# Patient Record
Sex: Female | Born: 1955
Health system: Southern US, Community
[De-identification: ages and names within clinical notes are randomized; demographics above are authoritative.]

## PROBLEM LIST (undated history)

## (undated) DIAGNOSIS — I493 Ventricular premature depolarization: Secondary | ICD-10-CM

## (undated) DIAGNOSIS — M2652 Limited mandibular range of motion: Secondary | ICD-10-CM

## (undated) DIAGNOSIS — Z8719 Personal history of other diseases of the digestive system: Secondary | ICD-10-CM

## (undated) DIAGNOSIS — R002 Palpitations: Secondary | ICD-10-CM

## (undated) DIAGNOSIS — F329 Major depressive disorder, single episode, unspecified: Secondary | ICD-10-CM

## (undated) DIAGNOSIS — L509 Urticaria, unspecified: Secondary | ICD-10-CM

## (undated) DIAGNOSIS — C44221 Squamous cell carcinoma of skin of unspecified ear and external auricular canal: Secondary | ICD-10-CM

## (undated) DIAGNOSIS — Z9989 Dependence on other enabling machines and devices: Secondary | ICD-10-CM

## (undated) DIAGNOSIS — Z9889 Other specified postprocedural states: Secondary | ICD-10-CM

## (undated) DIAGNOSIS — F40240 Claustrophobia: Secondary | ICD-10-CM

## (undated) DIAGNOSIS — Z973 Presence of spectacles and contact lenses: Secondary | ICD-10-CM

## (undated) DIAGNOSIS — F419 Anxiety disorder, unspecified: Secondary | ICD-10-CM

## (undated) DIAGNOSIS — R112 Nausea with vomiting, unspecified: Secondary | ICD-10-CM

## (undated) DIAGNOSIS — F32A Depression, unspecified: Secondary | ICD-10-CM

## (undated) DIAGNOSIS — Z859 Personal history of malignant neoplasm, unspecified: Secondary | ICD-10-CM

## (undated) DIAGNOSIS — L309 Dermatitis, unspecified: Secondary | ICD-10-CM

## (undated) DIAGNOSIS — M1990 Unspecified osteoarthritis, unspecified site: Secondary | ICD-10-CM

## (undated) DIAGNOSIS — G250 Essential tremor: Secondary | ICD-10-CM

## (undated) DIAGNOSIS — K219 Gastro-esophageal reflux disease without esophagitis: Secondary | ICD-10-CM

## (undated) DIAGNOSIS — F3162 Bipolar disorder, current episode mixed, moderate: Secondary | ICD-10-CM

## (undated) DIAGNOSIS — G4733 Obstructive sleep apnea (adult) (pediatric): Secondary | ICD-10-CM

## (undated) DIAGNOSIS — G43909 Migraine, unspecified, not intractable, without status migrainosus: Secondary | ICD-10-CM

## (undated) DIAGNOSIS — D649 Anemia, unspecified: Secondary | ICD-10-CM

## (undated) DIAGNOSIS — J4540 Moderate persistent asthma, uncomplicated: Secondary | ICD-10-CM

## (undated) DIAGNOSIS — Z8659 Personal history of other mental and behavioral disorders: Secondary | ICD-10-CM

## (undated) DIAGNOSIS — R030 Elevated blood-pressure reading, without diagnosis of hypertension: Secondary | ICD-10-CM

## (undated) DIAGNOSIS — J309 Allergic rhinitis, unspecified: Secondary | ICD-10-CM

## (undated) DIAGNOSIS — Z87440 Personal history of urinary (tract) infections: Secondary | ICD-10-CM

## (undated) DIAGNOSIS — R3989 Other symptoms and signs involving the genitourinary system: Secondary | ICD-10-CM

## (undated) DIAGNOSIS — N3010 Interstitial cystitis (chronic) without hematuria: Secondary | ICD-10-CM

## (undated) DIAGNOSIS — M26629 Arthralgia of temporomandibular joint, unspecified side: Secondary | ICD-10-CM

## (undated) DIAGNOSIS — E039 Hypothyroidism, unspecified: Secondary | ICD-10-CM

## (undated) DIAGNOSIS — L719 Rosacea, unspecified: Secondary | ICD-10-CM

## (undated) DIAGNOSIS — R350 Frequency of micturition: Secondary | ICD-10-CM

## (undated) DIAGNOSIS — E119 Type 2 diabetes mellitus without complications: Secondary | ICD-10-CM

## (undated) DIAGNOSIS — Z85820 Personal history of malignant melanoma of skin: Secondary | ICD-10-CM

## (undated) HISTORY — PX: ADENOIDECTOMY: SUR15

## (undated) HISTORY — PX: TEMPOROMANDIBULAR JOINT SURGERY: SHX35

## (undated) HISTORY — PX: OTHER SURGICAL HISTORY: SHX169

## (undated) HISTORY — PX: SINOSCOPY: SHX187

## (undated) HISTORY — PX: CARPAL TUNNEL RELEASE: SHX101

## (undated) HISTORY — PX: TRANSTHORACIC ECHOCARDIOGRAM: SHX275

## (undated) HISTORY — PX: BREAST EXCISIONAL BIOPSY: SUR124

## (undated) HISTORY — DX: Interstitial cystitis (chronic) without hematuria: N30.10

## (undated) HISTORY — DX: Rosacea, unspecified: L71.9

## (undated) HISTORY — DX: Depression, unspecified: F32.A

## (undated) HISTORY — PX: TONSILLECTOMY: SUR1361

## (undated) HISTORY — DX: Urticaria, unspecified: L50.9

## (undated) HISTORY — PX: ULNAR NERVE TRANSPOSITION: SHX2595

## (undated) HISTORY — PX: KNEE ARTHROSCOPY: SUR90

## (undated) HISTORY — DX: Major depressive disorder, single episode, unspecified: F32.9

## (undated) HISTORY — PX: FINGER ARTHROPLASTY: SHX5017

## (undated) HISTORY — PX: TRIGGER FINGER RELEASE: SHX641

## (undated) HISTORY — PX: ANAL FISSURE REPAIR: SHX2312

## (undated) HISTORY — PX: BUNIONECTOMY: SHX129

## (undated) HISTORY — PX: ENDOMETRIAL ABLATION W/ NOVASURE: SUR434

## (undated) HISTORY — DX: Personal history of urinary (tract) infections: Z87.440

## (undated) HISTORY — PX: CHOLECYSTECTOMY OPEN: SUR202

## (undated) HISTORY — PX: NASAL SEPTUM SURGERY: SHX37

## (undated) HISTORY — DX: Dermatitis, unspecified: L30.9

---

## 1982-02-08 DIAGNOSIS — N3010 Interstitial cystitis (chronic) without hematuria: Secondary | ICD-10-CM | POA: Insufficient documentation

## 1997-05-09 ENCOUNTER — Encounter: Admission: RE | Admit: 1997-05-09 | Discharge: 1997-08-07 | Payer: Self-pay | Admitting: Internal Medicine

## 1997-08-19 ENCOUNTER — Other Ambulatory Visit: Admission: RE | Admit: 1997-08-19 | Discharge: 1997-08-19 | Payer: Self-pay | Admitting: Obstetrics and Gynecology

## 1997-10-09 ENCOUNTER — Encounter: Admission: RE | Admit: 1997-10-09 | Discharge: 1998-01-07 | Payer: Self-pay | Admitting: Orthopaedic Surgery

## 1998-04-29 ENCOUNTER — Other Ambulatory Visit: Admission: RE | Admit: 1998-04-29 | Discharge: 1998-04-29 | Payer: Self-pay | Admitting: Internal Medicine

## 1998-08-20 ENCOUNTER — Other Ambulatory Visit: Admission: RE | Admit: 1998-08-20 | Discharge: 1998-08-20 | Payer: Self-pay | Admitting: Obstetrics and Gynecology

## 1999-01-10 ENCOUNTER — Ambulatory Visit (HOSPITAL_COMMUNITY): Admission: RE | Admit: 1999-01-10 | Discharge: 1999-01-10 | Payer: Self-pay | Admitting: Internal Medicine

## 1999-01-10 ENCOUNTER — Encounter: Payer: Self-pay | Admitting: Internal Medicine

## 1999-07-22 ENCOUNTER — Emergency Department (HOSPITAL_COMMUNITY): Admission: EM | Admit: 1999-07-22 | Discharge: 1999-07-22 | Payer: Self-pay | Admitting: Emergency Medicine

## 1999-07-22 ENCOUNTER — Encounter: Payer: Self-pay | Admitting: Emergency Medicine

## 2001-06-19 ENCOUNTER — Encounter: Payer: Self-pay | Admitting: Emergency Medicine

## 2001-06-19 ENCOUNTER — Emergency Department (HOSPITAL_COMMUNITY): Admission: EM | Admit: 2001-06-19 | Discharge: 2001-06-19 | Payer: Self-pay | Admitting: Emergency Medicine

## 2002-02-08 DIAGNOSIS — Z859 Personal history of malignant neoplasm, unspecified: Secondary | ICD-10-CM

## 2002-02-08 HISTORY — DX: Other specified postprocedural states: Z85.9

## 2002-04-11 ENCOUNTER — Other Ambulatory Visit: Admission: RE | Admit: 2002-04-11 | Discharge: 2002-04-11 | Payer: Self-pay | Admitting: Obstetrics and Gynecology

## 2003-02-27 ENCOUNTER — Encounter: Admission: RE | Admit: 2003-02-27 | Discharge: 2003-02-27 | Payer: Self-pay | Admitting: Obstetrics and Gynecology

## 2003-04-16 ENCOUNTER — Ambulatory Visit (HOSPITAL_BASED_OUTPATIENT_CLINIC_OR_DEPARTMENT_OTHER): Admission: RE | Admit: 2003-04-16 | Discharge: 2003-04-16 | Payer: Self-pay | Admitting: General Surgery

## 2003-04-16 ENCOUNTER — Encounter (INDEPENDENT_AMBULATORY_CARE_PROVIDER_SITE_OTHER): Payer: Self-pay | Admitting: *Deleted

## 2003-04-16 ENCOUNTER — Ambulatory Visit (HOSPITAL_COMMUNITY): Admission: RE | Admit: 2003-04-16 | Discharge: 2003-04-16 | Payer: Self-pay | Admitting: General Surgery

## 2003-07-29 ENCOUNTER — Other Ambulatory Visit: Admission: RE | Admit: 2003-07-29 | Discharge: 2003-07-29 | Payer: Self-pay | Admitting: Internal Medicine

## 2004-02-07 ENCOUNTER — Encounter: Admission: RE | Admit: 2004-02-07 | Discharge: 2004-02-07 | Payer: Self-pay | Admitting: Otolaryngology

## 2004-02-26 ENCOUNTER — Ambulatory Visit: Payer: Self-pay | Admitting: Internal Medicine

## 2004-03-13 ENCOUNTER — Ambulatory Visit: Payer: Self-pay | Admitting: Internal Medicine

## 2004-03-27 ENCOUNTER — Ambulatory Visit: Payer: Self-pay | Admitting: Internal Medicine

## 2004-04-01 ENCOUNTER — Ambulatory Visit: Payer: Self-pay | Admitting: Internal Medicine

## 2004-04-24 ENCOUNTER — Ambulatory Visit: Payer: Self-pay | Admitting: Internal Medicine

## 2004-05-07 ENCOUNTER — Ambulatory Visit: Payer: Self-pay | Admitting: Internal Medicine

## 2004-05-12 ENCOUNTER — Ambulatory Visit (HOSPITAL_BASED_OUTPATIENT_CLINIC_OR_DEPARTMENT_OTHER): Admission: RE | Admit: 2004-05-12 | Discharge: 2004-05-12 | Payer: Self-pay | Admitting: Internal Medicine

## 2004-05-17 ENCOUNTER — Ambulatory Visit: Payer: Self-pay | Admitting: Internal Medicine

## 2004-06-04 ENCOUNTER — Ambulatory Visit: Payer: Self-pay | Admitting: Internal Medicine

## 2004-11-02 ENCOUNTER — Ambulatory Visit: Payer: Self-pay | Admitting: Family Medicine

## 2004-11-05 ENCOUNTER — Encounter: Admission: RE | Admit: 2004-11-05 | Discharge: 2004-11-05 | Payer: Self-pay | Admitting: Family Medicine

## 2004-12-04 ENCOUNTER — Ambulatory Visit: Payer: Self-pay | Admitting: Internal Medicine

## 2004-12-07 ENCOUNTER — Ambulatory Visit: Payer: Self-pay | Admitting: Internal Medicine

## 2004-12-17 ENCOUNTER — Ambulatory Visit: Payer: Self-pay | Admitting: Internal Medicine

## 2004-12-17 ENCOUNTER — Encounter (INDEPENDENT_AMBULATORY_CARE_PROVIDER_SITE_OTHER): Payer: Self-pay | Admitting: Specialist

## 2005-01-06 ENCOUNTER — Ambulatory Visit: Payer: Self-pay | Admitting: Internal Medicine

## 2005-05-21 ENCOUNTER — Ambulatory Visit: Payer: Self-pay | Admitting: Family Medicine

## 2005-06-01 ENCOUNTER — Ambulatory Visit: Payer: Self-pay | Admitting: Internal Medicine

## 2005-07-28 ENCOUNTER — Ambulatory Visit (HOSPITAL_COMMUNITY): Admission: RE | Admit: 2005-07-28 | Discharge: 2005-07-28 | Payer: Self-pay | Admitting: Gastroenterology

## 2005-07-29 ENCOUNTER — Ambulatory Visit: Payer: Self-pay | Admitting: Obstetrics & Gynecology

## 2005-07-29 ENCOUNTER — Encounter (INDEPENDENT_AMBULATORY_CARE_PROVIDER_SITE_OTHER): Payer: Self-pay | Admitting: Specialist

## 2005-08-19 ENCOUNTER — Ambulatory Visit (HOSPITAL_COMMUNITY): Admission: RE | Admit: 2005-08-19 | Discharge: 2005-08-19 | Payer: Self-pay | Admitting: Obstetrics and Gynecology

## 2005-09-07 ENCOUNTER — Encounter (INDEPENDENT_AMBULATORY_CARE_PROVIDER_SITE_OTHER): Payer: Self-pay | Admitting: Specialist

## 2005-09-07 ENCOUNTER — Ambulatory Visit (HOSPITAL_COMMUNITY): Admission: RE | Admit: 2005-09-07 | Discharge: 2005-09-07 | Payer: Self-pay | Admitting: Gastroenterology

## 2006-01-13 ENCOUNTER — Ambulatory Visit (HOSPITAL_COMMUNITY): Admission: RE | Admit: 2006-01-13 | Discharge: 2006-01-13 | Payer: Self-pay | Admitting: Gastroenterology

## 2006-01-20 ENCOUNTER — Ambulatory Visit: Payer: Self-pay | Admitting: Internal Medicine

## 2006-03-02 ENCOUNTER — Ambulatory Visit: Payer: Self-pay | Admitting: Internal Medicine

## 2006-03-02 ENCOUNTER — Ambulatory Visit: Payer: Self-pay | Admitting: Obstetrics & Gynecology

## 2006-03-03 ENCOUNTER — Ambulatory Visit (HOSPITAL_COMMUNITY): Admission: RE | Admit: 2006-03-03 | Discharge: 2006-03-03 | Payer: Self-pay | Admitting: Obstetrics & Gynecology

## 2006-03-25 ENCOUNTER — Ambulatory Visit: Payer: Self-pay | Admitting: Internal Medicine

## 2006-03-27 ENCOUNTER — Encounter: Payer: Self-pay | Admitting: Internal Medicine

## 2006-04-14 ENCOUNTER — Ambulatory Visit: Payer: Self-pay | Admitting: Obstetrics and Gynecology

## 2006-05-02 ENCOUNTER — Ambulatory Visit (HOSPITAL_COMMUNITY): Admission: RE | Admit: 2006-05-02 | Discharge: 2006-05-02 | Payer: Self-pay | Admitting: Gastroenterology

## 2006-05-25 ENCOUNTER — Encounter: Admission: RE | Admit: 2006-05-25 | Discharge: 2006-05-25 | Payer: Self-pay | Admitting: Family Medicine

## 2006-05-27 ENCOUNTER — Ambulatory Visit: Payer: Self-pay | Admitting: Internal Medicine

## 2006-08-04 ENCOUNTER — Ambulatory Visit: Payer: Self-pay | Admitting: Internal Medicine

## 2006-08-04 LAB — CONVERTED CEMR LAB
Basophils Absolute: 0 10*3/uL (ref 0.0–0.1)
Eosinophils Absolute: 0.1 10*3/uL (ref 0.0–0.6)
MCHC: 33.9 g/dL (ref 30.0–36.0)
MCV: 94.6 fL (ref 78.0–100.0)
Neutrophils Relative %: 77.9 % — ABNORMAL HIGH (ref 43.0–77.0)
Platelets: 243 10*3/uL (ref 150–400)
RBC: 4.28 M/uL (ref 3.87–5.11)

## 2006-09-05 ENCOUNTER — Telehealth: Payer: Self-pay | Admitting: Internal Medicine

## 2006-09-14 ENCOUNTER — Telehealth (INDEPENDENT_AMBULATORY_CARE_PROVIDER_SITE_OTHER): Payer: Self-pay | Admitting: *Deleted

## 2006-10-07 ENCOUNTER — Ambulatory Visit: Payer: Self-pay | Admitting: Internal Medicine

## 2006-10-07 DIAGNOSIS — N3010 Interstitial cystitis (chronic) without hematuria: Secondary | ICD-10-CM | POA: Insufficient documentation

## 2006-10-07 DIAGNOSIS — G479 Sleep disorder, unspecified: Secondary | ICD-10-CM | POA: Insufficient documentation

## 2006-10-07 DIAGNOSIS — Z85828 Personal history of other malignant neoplasm of skin: Secondary | ICD-10-CM | POA: Insufficient documentation

## 2006-10-07 DIAGNOSIS — L719 Rosacea, unspecified: Secondary | ICD-10-CM | POA: Insufficient documentation

## 2006-10-07 DIAGNOSIS — J029 Acute pharyngitis, unspecified: Secondary | ICD-10-CM | POA: Insufficient documentation

## 2006-10-07 DIAGNOSIS — F319 Bipolar disorder, unspecified: Secondary | ICD-10-CM | POA: Insufficient documentation

## 2006-10-07 DIAGNOSIS — K589 Irritable bowel syndrome without diarrhea: Secondary | ICD-10-CM | POA: Insufficient documentation

## 2006-10-07 DIAGNOSIS — I10 Essential (primary) hypertension: Secondary | ICD-10-CM | POA: Insufficient documentation

## 2006-10-07 DIAGNOSIS — J4540 Moderate persistent asthma, uncomplicated: Secondary | ICD-10-CM | POA: Insufficient documentation

## 2006-10-07 DIAGNOSIS — J310 Chronic rhinitis: Secondary | ICD-10-CM | POA: Insufficient documentation

## 2006-10-07 DIAGNOSIS — J302 Other seasonal allergic rhinitis: Secondary | ICD-10-CM | POA: Insufficient documentation

## 2006-10-07 DIAGNOSIS — E039 Hypothyroidism, unspecified: Secondary | ICD-10-CM | POA: Insufficient documentation

## 2006-10-07 DIAGNOSIS — M170 Bilateral primary osteoarthritis of knee: Secondary | ICD-10-CM | POA: Insufficient documentation

## 2006-10-07 DIAGNOSIS — J3089 Other allergic rhinitis: Secondary | ICD-10-CM

## 2006-10-07 LAB — CONVERTED CEMR LAB: Rapid Strep: NEGATIVE

## 2006-10-11 ENCOUNTER — Telehealth: Payer: Self-pay | Admitting: Internal Medicine

## 2006-11-02 ENCOUNTER — Ambulatory Visit: Payer: Self-pay | Admitting: Internal Medicine

## 2006-11-02 DIAGNOSIS — L259 Unspecified contact dermatitis, unspecified cause: Secondary | ICD-10-CM | POA: Insufficient documentation

## 2006-11-02 DIAGNOSIS — R05 Cough: Secondary | ICD-10-CM | POA: Insufficient documentation

## 2006-11-02 DIAGNOSIS — K219 Gastro-esophageal reflux disease without esophagitis: Secondary | ICD-10-CM | POA: Insufficient documentation

## 2006-11-02 DIAGNOSIS — R053 Chronic cough: Secondary | ICD-10-CM | POA: Insufficient documentation

## 2006-11-23 ENCOUNTER — Telehealth (INDEPENDENT_AMBULATORY_CARE_PROVIDER_SITE_OTHER): Payer: Self-pay | Admitting: *Deleted

## 2006-12-08 ENCOUNTER — Ambulatory Visit: Payer: Self-pay | Admitting: Internal Medicine

## 2007-01-30 ENCOUNTER — Ambulatory Visit: Payer: Self-pay | Admitting: Internal Medicine

## 2007-01-30 DIAGNOSIS — G4733 Obstructive sleep apnea (adult) (pediatric): Secondary | ICD-10-CM

## 2007-01-30 DIAGNOSIS — Z9989 Dependence on other enabling machines and devices: Secondary | ICD-10-CM | POA: Insufficient documentation

## 2007-01-30 DIAGNOSIS — G4730 Sleep apnea, unspecified: Secondary | ICD-10-CM | POA: Insufficient documentation

## 2007-02-08 ENCOUNTER — Ambulatory Visit: Payer: Self-pay | Admitting: Internal Medicine

## 2007-02-20 ENCOUNTER — Telehealth: Payer: Self-pay | Admitting: Internal Medicine

## 2007-02-21 ENCOUNTER — Telehealth (INDEPENDENT_AMBULATORY_CARE_PROVIDER_SITE_OTHER): Payer: Self-pay | Admitting: *Deleted

## 2007-03-17 ENCOUNTER — Encounter: Payer: Self-pay | Admitting: Internal Medicine

## 2007-03-20 ENCOUNTER — Telehealth: Payer: Self-pay | Admitting: Internal Medicine

## 2007-03-24 ENCOUNTER — Telehealth: Payer: Self-pay | Admitting: *Deleted

## 2007-03-24 DIAGNOSIS — N941 Unspecified dyspareunia: Secondary | ICD-10-CM | POA: Insufficient documentation

## 2007-06-02 ENCOUNTER — Telehealth: Payer: Self-pay | Admitting: *Deleted

## 2007-07-05 ENCOUNTER — Other Ambulatory Visit: Admission: RE | Admit: 2007-07-05 | Discharge: 2007-07-05 | Payer: Self-pay | Admitting: Obstetrics & Gynecology

## 2007-07-05 ENCOUNTER — Ambulatory Visit: Payer: Self-pay | Admitting: Obstetrics & Gynecology

## 2007-07-19 ENCOUNTER — Ambulatory Visit: Payer: Self-pay | Admitting: Obstetrics and Gynecology

## 2007-07-19 ENCOUNTER — Ambulatory Visit (HOSPITAL_COMMUNITY): Admission: RE | Admit: 2007-07-19 | Discharge: 2007-07-19 | Payer: Self-pay | Admitting: Surgery

## 2007-07-21 ENCOUNTER — Telehealth: Payer: Self-pay | Admitting: *Deleted

## 2007-07-25 ENCOUNTER — Ambulatory Visit: Payer: Self-pay | Admitting: Internal Medicine

## 2007-07-31 LAB — CONVERTED CEMR LAB: Lithium Lvl: 0.13 meq/L — ABNORMAL LOW (ref 0.80–1.40)

## 2007-08-07 ENCOUNTER — Ambulatory Visit (HOSPITAL_COMMUNITY): Admission: RE | Admit: 2007-08-07 | Discharge: 2007-08-07 | Payer: Self-pay | Admitting: Obstetrics & Gynecology

## 2007-08-07 ENCOUNTER — Encounter: Payer: Self-pay | Admitting: Obstetrics & Gynecology

## 2007-08-07 ENCOUNTER — Ambulatory Visit: Payer: Self-pay | Admitting: Obstetrics & Gynecology

## 2007-08-22 ENCOUNTER — Ambulatory Visit: Payer: Self-pay | Admitting: Internal Medicine

## 2007-08-30 LAB — CONVERTED CEMR LAB
ALT: 11 units/L (ref 0–35)
AST: 15 units/L (ref 0–37)
Albumin: 3.1 g/dL — ABNORMAL LOW (ref 3.5–5.2)
Alkaline Phosphatase: 57 units/L (ref 39–117)
BUN: 23 mg/dL (ref 6–23)
Bilirubin, Direct: 0.1 mg/dL (ref 0.0–0.3)
CO2: 28 meq/L (ref 19–32)
Chloride: 106 meq/L (ref 96–112)
Eosinophils Relative: 2.5 % (ref 0.0–5.0)
GFR calc Af Amer: 75 mL/min
Glucose, Bld: 73 mg/dL (ref 70–99)
HCT: 36.5 % (ref 36.0–46.0)
HDL: 47.6 mg/dL (ref >39.0)
LDL Cholesterol: 83 mg/dL (ref 0–99)
Lymphocytes Relative: 25.4 % (ref 12.0–46.0)
Monocytes Absolute: 0.6 10*3/uL (ref 0.1–1.0)
Monocytes Relative: 5.1 % (ref 3.0–12.0)
Platelets: 285 10*3/uL (ref 150–400)
Potassium: 5 meq/L (ref 3.5–5.1)
Total CHOL/HDL Ratio: 3.6
Total Protein: 6.2 g/dL (ref 6.0–8.3)
WBC: 11 10*3/uL — ABNORMAL HIGH (ref 4.5–10.5)

## 2007-09-01 ENCOUNTER — Ambulatory Visit: Payer: Self-pay | Admitting: Obstetrics & Gynecology

## 2007-09-04 ENCOUNTER — Telehealth: Payer: Self-pay | Admitting: *Deleted

## 2007-10-30 ENCOUNTER — Encounter: Payer: Self-pay | Admitting: Internal Medicine

## 2007-11-09 ENCOUNTER — Telehealth: Payer: Self-pay | Admitting: Internal Medicine

## 2007-11-22 ENCOUNTER — Telehealth: Payer: Self-pay | Admitting: Family Medicine

## 2007-12-01 ENCOUNTER — Ambulatory Visit: Payer: Self-pay | Admitting: Internal Medicine

## 2007-12-01 DIAGNOSIS — N898 Other specified noninflammatory disorders of vagina: Secondary | ICD-10-CM | POA: Insufficient documentation

## 2007-12-01 DIAGNOSIS — Z9189 Other specified personal risk factors, not elsewhere classified: Secondary | ICD-10-CM | POA: Insufficient documentation

## 2007-12-01 DIAGNOSIS — N938 Other specified abnormal uterine and vaginal bleeding: Secondary | ICD-10-CM | POA: Insufficient documentation

## 2007-12-01 DIAGNOSIS — N949 Unspecified condition associated with female genital organs and menstrual cycle: Secondary | ICD-10-CM | POA: Insufficient documentation

## 2007-12-01 DIAGNOSIS — N925 Other specified irregular menstruation: Secondary | ICD-10-CM | POA: Insufficient documentation

## 2007-12-26 ENCOUNTER — Telehealth: Payer: Self-pay | Admitting: Internal Medicine

## 2008-01-22 ENCOUNTER — Telehealth: Payer: Self-pay | Admitting: Internal Medicine

## 2008-02-20 ENCOUNTER — Telehealth: Payer: Self-pay | Admitting: *Deleted

## 2008-04-01 ENCOUNTER — Encounter: Payer: Self-pay | Admitting: Internal Medicine

## 2008-04-23 ENCOUNTER — Telehealth: Payer: Self-pay | Admitting: Internal Medicine

## 2008-05-02 ENCOUNTER — Telehealth: Payer: Self-pay | Admitting: *Deleted

## 2008-05-09 ENCOUNTER — Telehealth: Payer: Self-pay | Admitting: Internal Medicine

## 2008-05-17 ENCOUNTER — Telehealth: Payer: Self-pay | Admitting: *Deleted

## 2008-08-15 ENCOUNTER — Ambulatory Visit: Payer: Self-pay | Admitting: Obstetrics & Gynecology

## 2008-08-15 ENCOUNTER — Other Ambulatory Visit: Admission: RE | Admit: 2008-08-15 | Discharge: 2008-08-15 | Payer: Self-pay | Admitting: Obstetrics & Gynecology

## 2008-08-15 ENCOUNTER — Encounter: Payer: Self-pay | Admitting: Obstetrics & Gynecology

## 2008-08-15 ENCOUNTER — Encounter: Payer: Self-pay | Admitting: Obstetrics and Gynecology

## 2008-08-15 LAB — CONVERTED CEMR LAB: Trich, Wet Prep: NONE SEEN

## 2008-08-16 ENCOUNTER — Encounter: Payer: Self-pay | Admitting: Obstetrics & Gynecology

## 2008-08-16 LAB — CONVERTED CEMR LAB
Hemoglobin: 13.1 g/dL (ref 12.0–15.0)
Prolactin: 44.2 ng/mL
RBC: 4.24 M/uL (ref 3.87–5.11)
TSH: 1.398 microintl units/mL (ref 0.350–4.500)
WBC: 10 10*3/uL (ref 4.0–10.5)

## 2008-08-20 ENCOUNTER — Ambulatory Visit (HOSPITAL_COMMUNITY): Admission: RE | Admit: 2008-08-20 | Discharge: 2008-08-20 | Payer: Self-pay | Admitting: Family Medicine

## 2008-08-29 ENCOUNTER — Ambulatory Visit: Payer: Self-pay | Admitting: Obstetrics & Gynecology

## 2008-08-30 ENCOUNTER — Encounter: Payer: Self-pay | Admitting: Internal Medicine

## 2008-08-30 LAB — CONVERTED CEMR LAB: FSH: 12.1 milliintl units/mL

## 2008-09-04 ENCOUNTER — Ambulatory Visit (HOSPITAL_COMMUNITY): Admission: RE | Admit: 2008-09-04 | Discharge: 2008-09-04 | Payer: Self-pay | Admitting: Internal Medicine

## 2008-09-12 ENCOUNTER — Ambulatory Visit: Payer: Self-pay | Admitting: Obstetrics and Gynecology

## 2008-10-21 ENCOUNTER — Ambulatory Visit: Payer: Self-pay | Admitting: Obstetrics & Gynecology

## 2008-10-21 ENCOUNTER — Ambulatory Visit (HOSPITAL_COMMUNITY): Admission: RE | Admit: 2008-10-21 | Discharge: 2008-10-21 | Payer: Self-pay | Admitting: Obstetrics & Gynecology

## 2008-11-15 ENCOUNTER — Inpatient Hospital Stay (HOSPITAL_COMMUNITY): Admission: AD | Admit: 2008-11-15 | Discharge: 2008-11-15 | Payer: Self-pay | Admitting: Obstetrics & Gynecology

## 2008-11-15 ENCOUNTER — Ambulatory Visit: Payer: Self-pay | Admitting: Obstetrics & Gynecology

## 2009-10-14 ENCOUNTER — Telehealth: Payer: Self-pay | Admitting: Internal Medicine

## 2010-01-12 ENCOUNTER — Emergency Department (HOSPITAL_COMMUNITY)
Admission: EM | Admit: 2010-01-12 | Discharge: 2010-01-12 | Payer: Self-pay | Source: Home / Self Care | Admitting: Emergency Medicine

## 2010-01-19 ENCOUNTER — Telehealth: Payer: Self-pay | Admitting: *Deleted

## 2010-02-18 ENCOUNTER — Encounter: Payer: Self-pay | Admitting: Internal Medicine

## 2010-03-04 ENCOUNTER — Telehealth: Payer: Self-pay | Admitting: *Deleted
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
## 2010-03-12 NOTE — Progress Notes (Signed)
  Phone Note Call from Patient Call back at Home Phone [phone] Call back at [phone]   Caller: Patient Summary of Call: Pt has got her [employer]. She got her labs done recently unsure who done them. Pt is wanting to know if we could continue to see Dr. Fabian Sharp or if she needs to go to [facility]. Initial call taken by: Romualdo Bolk, CMA Duncan Dull),  March 04, 2010 1:38 PM  Follow-up for Phone Call          [facility]  not [facility] .    they can help with meds and consults  and other care  etc.    However it is up to her.  Follow-up by: Madelin Headings MD,  March 05, 2010 10:14 PM  Additional Follow-up for Phone Call Additional follow up Details #1::        Left message on machine about above. Additional Follow-up by: Romualdo Bolk, CMA (AAMA),  March 06, 2010 9:40 AM

## 2010-03-12 NOTE — Progress Notes (Signed)
Summary: refill  Phone Note From Pharmacy   Caller: [facility] Reason for Call: Needs renewal Details for Reason: synthroid Initial call taken by: Romualdo Bolk, CMA (AAMA),  January 19, 2010 4:42 PM    Prescriptions: LEVOTHROID 75 MCG TABS (LEVOTHYROXINE SODIUM) Take 1 tablet by mouth once a day  #90 x 0   Entered by:   Romualdo Bolk, CMA (AAMA)   Authorized by:   Madelin Headings MD   Signed by:   Romualdo Bolk, CMA (AAMA) on 01/19/2010   Method used:   Faxed to ...       [facility] Department (retail)       [address] [place], [place]  [address]       Ph: [phone]       Fax: [phone]   RxID:   [id number]

## 2010-05-01 ENCOUNTER — Inpatient Hospital Stay (INDEPENDENT_AMBULATORY_CARE_PROVIDER_SITE_OTHER)
Admission: RE | Admit: 2010-05-01 | Discharge: 2010-05-01 | Disposition: A | Discharge status: Home / Self Care | Payer: Self-pay | Source: Ambulatory Visit | Attending: Family Medicine | Admitting: Family Medicine

## 2010-05-01 DIAGNOSIS — J019 Acute sinusitis, unspecified: Secondary | ICD-10-CM

## 2010-05-15 LAB — URINALYSIS, ROUTINE W REFLEX MICROSCOPIC
Glucose, UA: NEGATIVE mg/dL
Protein, ur: NEGATIVE mg/dL
Urobilinogen, UA: 0.2 mg/dL (ref 0.0–1.0)

## 2010-05-15 LAB — CBC
Hemoglobin: 14.1 g/dL (ref 12.0–15.0)
MCHC: 33.9 g/dL (ref 30.0–36.0)
RBC: 4.33 MIL/uL (ref 3.87–5.11)
RDW: 12.8 % (ref 11.5–15.5)

## 2010-05-15 LAB — PREGNANCY, URINE: Preg Test, Ur: NEGATIVE

## 2010-05-15 LAB — BASIC METABOLIC PANEL
CO2: 28 mEq/L (ref 19–32)
Calcium: 9.7 mg/dL (ref 8.4–10.5)
GFR calc Af Amer: >60 mL/min (ref >60)
GFR calc non Af Amer: >60 mL/min (ref >60)
Glucose, Bld: 78 mg/dL (ref 70–99)
Potassium: 4 mEq/L (ref 3.5–5.1)
Sodium: 139 mEq/L (ref 135–145)

## 2010-06-15 ENCOUNTER — Other Ambulatory Visit (HOSPITAL_COMMUNITY): Payer: Self-pay | Admitting: Family Medicine

## 2010-06-15 DIAGNOSIS — Z1231 Encounter for screening mammogram for malignant neoplasm of breast: Secondary | ICD-10-CM

## 2010-06-19 ENCOUNTER — Ambulatory Visit (HOSPITAL_COMMUNITY): Admission: RE | Admit: 2010-06-19 | Payer: Self-pay | Source: Ambulatory Visit

## 2010-06-19 ENCOUNTER — Inpatient Hospital Stay (INDEPENDENT_AMBULATORY_CARE_PROVIDER_SITE_OTHER)
Admission: RE | Admit: 2010-06-19 | Discharge: 2010-06-19 | Disposition: A | Discharge status: Home / Self Care | Payer: Self-pay | Source: Ambulatory Visit | Attending: Emergency Medicine | Admitting: Emergency Medicine

## 2010-06-19 ENCOUNTER — Ambulatory Visit (INDEPENDENT_AMBULATORY_CARE_PROVIDER_SITE_OTHER): Payer: Self-pay

## 2010-06-19 DIAGNOSIS — S139XXA Sprain of joints and ligaments of unspecified parts of neck, initial encounter: Secondary | ICD-10-CM

## 2010-06-19 DIAGNOSIS — G44209 Tension-type headache, unspecified, not intractable: Secondary | ICD-10-CM

## 2010-06-23 NOTE — Group Therapy Note (Signed)
[name], [name]      ACCOUNT NO.:  [id number]   MEDICAL RECORD NO.:  [id number]          PATIENT TYPE:  WOC   LOCATION:  [facility]                   FACILITY:  [facility]   PHYSICIAN:  Dorthula Perfect, MD     DATE OF BIRTH:  1955/07/22   DATE OF SERVICE:  07/05/2007                                  CLINIC NOTE   A 55 year old white female, nulliparous who presents with a complaint of  irregular bleeding and abdominal pain.  She was seen here in January  2008, by myself with abdominal pain and past history of interstitial  cystitis for which she was evaluated and treated by Dr. Larey Dresser.  She has no urinary complaints at this time.  She has been evaluated for  the abdominal discomfort by Dr. Ewing Schlein, and had an abdominal and pelvic  CT scan done in the past.  A pelvic ultrasound in January 2008, revealed  normal ovaries.  The uterus was 8.1 cm in length, 2.8 in width and 4.1  transverse.  The endometrial stripe was thin.  Because she was getting  married a year ago, she went to family planning where they tried  different combinations of birth control pills to provide contraception,  as well as help with her bleeding.  She has had irregular menses for  years, described as going no longer than 2 weeks without any bleeding.  She was put on a birth control pill first for 3 months, and despite  being on that, she had bleeding almost every day.  She was then put on  Seasonale which she took for 5 months.  She had bleeding and/or spotting  every day.  She has been put on Ortho-Cyclen for the past six cycles and  uses a mini-pad every day.  She has occasional mild cramping.  She has  occasional bleeding with sexual intercourse.  Her last Pap smear was 2  months ago, and she does not have the result of that.   She has occasional sharp right lower quadrant pains.  She has no problem  with constipation and/or diarrhea.   Her mother was menopausal at age 45.   PHYSICAL EXAMINATION:   VITAL SIGNS:  Height 5 feet, weight 142, blood  pressure 140/83.  Exam was limited to the abdomen and pelvis.  ABDOMEN:  Her abdomen is soft and nontender.  No masses are felt.  PELVIC:  External genitalia and BUS glands were normal.  Vaginal vault  was epithelialized as was the cervix.  The uterus was of normal size and  shape.  It is was nontender.  The left adnexal areas were bilaterally  normal.   IMPRESSION:  Abnormal uterine bleeding.   DISPOSITION:  Because she has been on various forms of contraception for  the past year without change in her bleeding in pattern, I elected to do  an endometrial biopsy.  The uterus sounded 7-cm in the mid plane.  Endometrial biopsy was performed, with some cramping on the part of the  patient.  On withdrawal of the suction curette, a mucusy polyp came  forth which was grasped with packing forceps and was removed.  This  caused a slight amount  of endocervical bleeding.   1. The patient will return in 2 weeks for results and follow up.  2. Whether or not removal of this polyp will take of her problem only      time will tell.  She may need a hysteroscopy to further delineate      the etiology of the bleeding.           ______________________________  Dorthula Perfect, MD     ER/MEDQ  D:  07/05/2007  T:  07/05/2007  Job:  [id number]

## 2010-06-23 NOTE — Group Therapy Note (Signed)
NAME:  [name], [name]      ACCOUNT NO.:  [id number]   MEDICAL RECORD NO.:  [id number]          PATIENT TYPE:  WOC   [facility]ATION[facility]                   FACILITY:  [facility]   PHYSICIAN:  Deirdre Christy Gentles, CNM       DATE OF BIRTH:  June 04, 1955   DATE OF SERVICE:                                  CLINIC NOTE   CHIEF COMPLAINT:  Abnormal vaginal bleeding and right lower quadrant  pain.   HISTORY:  This is a 55 year old Caucasian nulligravida, who has a 2-year  history of both AUB     and chronic right lower quadrant pain.  She  describes having 3 menses per month and also spotting intermittently  whether or not she is on OCPs, she also has postcoital bleeding about 3  out of 4 times.  She would like something done to stop the bleeding.  She has had a complete workup both from her gastroenterologist to get  ultrasounds and colonoscopy and from GYN clinic here, where she had  essentially negative ultrasound on August 20, 2008.  Uterus is 8-cm size,  no fibroids.  Ovaries are essentially normal.  Also last month, she had  a mammogram that was negative.  Her Pap smear was negative on August 15, 2008 as well.  Her endometrial biopsy was benign on August 15, 2008.  The  uterine curettings showed no hyperplasia or carcinoma done in June 2009.  Of note, she did have an endocervical polyp removed.  Following that,  she had a hysteroscopy, which was in June 2009 and it was fairly normal  pelvic on her diagnostic hysteroscopy.  She has FSH, which is 12.1  consistent with being premenopausal.  GC and chlamydia are negative.  Prolactin is minimally elevated at 44.  TSH is normal at 1.3.  Her  hemoglobin is  13.1.  Her wet prep was negative.  Urine culture was  negative.  She did take the Elmiron as ordered by Dr. Debroah Loop at her last  visit here and says that it did nothing for her pain and that she is  quite sure her pain is not from interstitial cystitis.  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  She does still see Mental Health for her bipolar disorder, but  has lost her insurance and is not seeing other specialists.  Of note,  she does use CPAP most nights and she takes Singulair and Rhinocort on a  regular basis for asthma.   PHYSICAL EXAMINATION:  VITAL SIGNS:  Her BP is 126/76, weight 143,  height is 5 feet, temperature 97.1.  GENERAL:  Physical exam is deferred today.  She is in no distress.   ASSESSMENT AND PLAN:  In consultation with Dr. Macon Large, who saw the  patient last month and at that time did a complete history and physical.  It is decided that we will proceed with NovaSure ablation.  The  procedure is discussed at length with the patient and she was given a  handout and a brochure on NovaSure and she is told that she will be  getting a call regarding  scheduling the procedure.           ______________________________  Caren Griffins, CNM     DP/MEDQ  D:  09/12/2008  T:  09/13/2008  Job:  [id number]

## 2010-06-23 NOTE — Group Therapy Note (Signed)
[name], [name]      ACCOUNT NO.:  [id number]   MEDICAL RECORD NO.:  [id number]          PATIENT TYPE:  WOC   [facility]ATION[facility]                   FACILITY:  [facility]   PHYSICIAN:  Jaynie Collins, MD     DATE OF BIRTH:  Sep 11, 1955   DATE OF SERVICE:                                  CLINIC NOTE   CHIEF COMPLAINT:  Irregular bleeding and right lower quadrant pain.   HISTORY OF PRESENT ILLNESS:  The patient is a 55 year old nulligravida  with a history of dysfunctional uterine bleeding and chronic right lower  quadrant pain, who was last seen in June 2009 for a similar complaint of  having irregular bleeding.  The patient did undergo a hysteroscopy D and  C in 2009, which was only remarkable for an endometrial polyp.  She  reports that after her hysteroscopy, she did not have any abnormal  bleeding for 2 months, but then the bleeding reoccurred.  The patient at  this point reports bleeding most days of the month and is having less  than a week total free of bleeding.  She says that she has about 2 or 3  full periods, where she has regular menstrual flow for 3 days and the  rest of the days are just spotting and the spotting is also precipitated  by coitus.  The patient also reports chronic right lower quadrant pain.  She has had negative evaluation for this in the past, but the patient  wants to make sure that her ovary on that side is normal.  The patient  also has other issues that she reports having to do at her mental  health.  She is bipolar on lithium and is followed by [facility].   PAST OB/GYN HISTORY:  The patient is nulligravida.  The menstrual  history is as above.  The patient denies abnormal Pap smears.  Her last  Pap smear was in June 2007.  Her last mammogram was in April 2008.   PAST MEDICAL HISTORY:  Bipolar, chronic pain, and seasonal allergies.  Maxalt as needed, Ortho-Cyclen daily, and diclofenac as needed.   PAST SURGICAL HISTORY:   Temporomandibular joint surgeries, total knee  surgeries, and gallbladder removed through an old classical incision.  The patient has also had a hysteroscopy D and C as previously mentioned.  The patient does have a history of hypothyroidism.   MEDICATIONS:  1. Lithium 300 mg twice a day.  2. Singulair 20 mg daily.  3. Clarinex 5 mg daily.  4. Aciphex 20 mg.  5. Depakote 500 mg 3 tablets per night.  6. Rhinocort.  7. Proventil inhaler.   ALLERGIES:  ASPIRIN, INDOCIN, IVP DYE, MARCAINE, and CODEINE.  The  patient is not allergic to latex.   SOCIAL HISTORY:  The patient is currently unemployed.  She is trying to  lets become a Runner, broadcasting/film/video.  She does smoke, drink alcohol, or take any other  drugs.   FAMILY HISTORY:  Negative for any gynecologic, colon, or breast cancer  history.  No other significant history.   REVIEW OF SYSTEMS:  Negative with the exception of what is reported in  the history of present  illness section.   PHYSICAL EXAMINATION:  VITAL SIGNS:  Temperature 97.8, pulse 90, blood  pressure 108/74, weight 145.1 pounds, and height 60 inches.  GENERAL:  No apparent distress.  HEENT:  Normocephalic, atraumatic.  NECK:  Supple.  Symmetrical thyroid.  BREASTS:  Symmetric in size.  No dominant masses.  No abnormal skin  changes, nipple discharge, or lymphadenopathy.  LUNGS:  Clear to auscultation bilaterally.  HEART:  Regular rate and rhythm.  ABDOMEN:  Soft.  No abnormal masses palpated.  The patient did endorse  pain on deep palpation of her right lower quadrant.  EXTREMITIES:  No  clubbing, cyanosis, or edema.  PELVIC:  Normal external female genitalia.  Pink and well-rugated  vagina.  The patient does have copious amount of yellow thin discharge  and sample was taken for wet prep.  No blood was seen in the vaginal  vault and no active bleeding.  Pap smear was obtained.  On obtaining Pap  smear, the patient was noted to have a very a friable ectocervix with  increased  bleeding.  BIMANUAL:  She has a small mobile uterus.  Normal adnexa bilaterally.  No tenderness on bimanual exam.   ENDOMETRIAL BIOPSY:  The patient was counseled regarding the need for  endometrial biopsy, given her continued irregular bleeding and consent  was obtained after the Pap smear was obtained.  During the pelvic exam,  her cervix was swabbed with Betadine and a tenaculum was placed in the  anterior lip of the cervix.  The uterus was sounded to 7 cm and a 3-mm  Pipelle was advanced into the uterine fundus.  Small amount of tissue  was obtained after 2 passes.  The patient was noted to have some  bleeding after the biopsy, but otherwise tolerated the procedure well.  All instruments were then removed from the patient's pelvis.   ASSESSMENT AND PLAN:  The patient is a 55 year old nulligravida with  abnormal uterine bleeding and chronic right lower quadrant pain.  As for  her abnormal bleeding, She had a Pap smear today and endometrial biopsy  is pending.  The patient will come back IN 2 weeks to discuss the  results of these procedures.  If her biopsy is benign, she might be  candidate for an endometrial ablation.  As for her right lower quadrant  pain, she has had negative evaluation in the past, but she will be  scheduled for pelvic ultrasound at the end of this visit, also to  evaluate her irregular bleeding.  In addition, the patient will have  labs of her complete blood count, TSH, and prolactin checked, as the  patient does have a history of hypothyroidism and have not had the  opportunity to check the thyroid levels in quite some time.  The patient  would also be scheduled for routine mammogram at the end of this visit.           ______________________________  Jaynie Collins, MD     UA/MEDQ  D:  08/15/2008  T:  08/16/2008  Job:  [id number]

## 2010-06-23 NOTE — Group Therapy Note (Signed)
NAMELEYAN, BRANDEN      ACCOUNT NO.:  [id number]   MEDICAL RECORD NO.:  [id number]          PATIENT TYPE:  WOC   [facility]ATION[facility]                   FACILITY:  [facility]   PHYSICIAN:  Scheryl Darter, MD       DATE OF BIRTH:  02-18-1955   DATE OF SERVICE:  08/29/2008                                  CLINIC NOTE   The patient returns today for evaluation of her irregular bleeding,  right lower quadrant pain.  She was seen by Dr. Macon Large on July 9.  At  that time, endometrial biopsy was obtained and prolactin and TSH.  The  patient has long history of irregular bleeding despite being on oral  contraceptive pills.  She is 55 years old.  She says it has actually  been a few weeks since she took her pills that she run out.  She still  sees bleeding at time of intercourse and she has some right lower  quadrant pain and dyspareunia.  She has negative laparoscopy and her  endometrial biopsy was benign.  Her Pap test has showed inflammation.  She was recently treated with metronidazole because of this.  She says  that she has history of diagnosis of interstitial cystitis that was  treated at least 20 years ago at Elmiron.  She did not think that her  symptoms were consistent with her previous symptoms of interstitial  cystitis.  We discussed with her previous evaluation and her symptoms  for at least 30 minutes.  Recommended that she stay off her oral  contraceptives for now.  There is possibility that her bleeding is due  to ectropion and cervicitis and this could be treated with cryosurgery.  I would like to do a trial on Elmiron and see if this pain responded to  therapy for interstitial cystitis.  I gave a prescription for Elmiron  200 mg p.o. b.i.d.  I have ordered a FSH today.  She will return in 3-4  weeks to review her progress on Elmiron and further discussed possible  several cryosurgery.      Scheryl Darter, MD     JA/MEDQ  D:  08/29/2008  T:  08/30/2008  Job:  [id number]

## 2010-06-23 NOTE — Group Therapy Note (Signed)
[name], [name]      ACCOUNT NO.:  [id number]   MEDICAL RECORD NO.:  [id number]          PATIENT TYPE:  WOC   [facility]ATION[facility]                   [facility]ACILITY[facility]   PHYSICIAN:  Argentina Donovan, MD        DATE OF BIRTH:  05-Apr-1955   DATE OF SERVICE:  07/19/2007                                  CLINIC NOTE   CHIEF COMPLAINT:  Continual vaginal spotting and right lower quadrant  pain.   The patient is a 55 year old Caucasian female, nulligravida (not by  choice), who for several years now has had chronic right lower quadrant  pain.  She had a complete GI workup, which was normal including  colonoscopy and had an ultrasound, which came back as normal.  She has  also in her last visit here had an endometrial biopsy, which showed  benign endometrial polyp fragments and benign endometrial mucosa.  The  patient has been on oral contraceptives multiple types in order to  control spotting, but to no avail.  It can come any time during her  cycle and is especially noticeable after coitus.   ALLERGIES:  She has allergies to ASPIRIN, INDOCIN, IVP DYE, CODEINE, and  MARCAINE.   MEDICATIONS:  The patient is on multiple medications, see attached list.   PAST MEDICAL HISTORY:  She is bipolar.  She is on multiple medications  for that, which has controlled her problem nicely.  She has many  seasonal allergies, which she says occurs all times of the year.   She does not smoke, drink alcohol, or take other drugs.   PAST SURGICAL HISTORY:  She has had TMJ surgeries twice and 2 knee  surgery.  She has had a gallbladder removed through an old classical  incision.   FAMILY HISTORY:  Noncontributory.   REVIEW OF SYSTEMS:  Negative with the exception of present illness and  past medical history.   PHYSICAL EXAMINATION:  VITAL SIGNS:  Blood pressure 110/67, pulse 71,  temperature 98, weight 145, height 5 feet.  GENERAL:  A well-developed, well-nourished white female, in no acute  distress.  HEENT:  Within normal limits.  NECK:  Supple.  Thyroid is symmetrical with no masses.  Neck is erect.  BREASTS:  Normal, symmetrical.  No dominant masses.  Several small scars  from previous biopsies.  No nipple discharge.  LUNGS:  Clear to auscultation and percussion.  HEART:  No murmurs.  Normal sinus rhythm.  ABDOMEN:  Soft, flat, and nontender.  No masses or organomegaly.  PELVIS:  External genitalia is normal.  BUS within normal limits.  Vagina is clean and well rugated.  Cervix is clean, nulliparous.  The  uterus is normal size and consistency.  Adnexa is normal.  RECTAL:  No masses.  EXTREMITIES:  No edema.  No varicosities.  DTRs within normal limits.  SKIN:  Normal turgor and pallor.   IMPRESSION:  Menorrhagia, probable endometrial polyp, and chronic right  adnexal pain.           ______________________________  Argentina Donovan, MD     PR/MEDQ  D:  07/19/2007  T:  07/20/2007  Job:  [id number]

## 2010-06-23 NOTE — Op Note (Signed)
NAME:  [name], [name]      ACCOUNT NO.:  [id number]   MEDICAL RECORD NO.:  [id number]          PATIENT TYPE:  AMB   LOCATION:  [facility]                           [facility]   PHYSICIAN:  Allie Bossier, MD        DATE OF BIRTH:  [birth year]   DATE OF PROCEDURE:  DATE OF DISCHARGE:                               OPERATIVE REPORT   PREOPERATIVE DIAGNOSES:  1. Chronic pelvic pain plus right lower quadrant pain.  2. Dysfunctional uterine bleeding.   SURGEON:  Allie Bossier, MD   ANESTHESIOLOGIST:  Araceli Bouche, MD   ANESTHETIC METHOD:  General endotracheal anesthesia.   PROCEDURES:  1. Diagnostic laparoscopy.  2. Diagnostic hysteroscopy.  3. Dilation and curettage.   FINDINGS:  Uterine polyp on hysteroscopy, and clearly normal pelvis with  diagnostic hysteroscopy.   DETAILS OF PROCEDURE AND FINDINGS:  The risks, benefits, alternatives of  surgery were explained to Ms. Shuing-Milligan and her husband that in  some cases of chronic pelvic pain, the diagnostic laparoscopy may show  no abnormalities, but this does not invalidate the patient's complaint  of pain.  All questions were answered.  Consents were signed.  She was  taken to the operating room.  General anesthesia was applied.  She was  placed in a dorsal lithotomy position.  Her vagina and abdomen were  prepped and draped in usual sterile fashion.  Her bladder was emptied  with a Robinson catheter.  Bimanual exam revealed a small anteverted  mobile uterus with nonenlarged adnexa.  Speculum was placed.  Hulka  manipulator was placed.  Gloves were changed.  Attention was turned to  the abdomen.  A vertical umbilical incision was made.  A Veress needle  was placed intraperitoneally.  Low-flow CO2 was used to insufflate the  abdomen approximately 2 liters.  A 10-mm trocar was placed.  Laparoscopy  confirmed correct placement.  She was placed in Trendelenburg position  and a 5-mm port was placed under direct laparoscopic  visualization of  the suprapubic area.  The pelvis was inspected in an alpha manner.  The  right ovary was small and atrophic.  No abnormalities.  There was no  evidence of endometriosis in the entire pelvis.  There was no abnormal  fluid collections in the pelvis.  There were no adhesions.  The ovary on  the left appeared similarly small and slightly less atrophic.  The  anterior cul-de-sac was normal.  The upper abdomen was normal.  There  was a fair amount of distention of the small and large bowel with gas  noted.  The appendix was small and clearly normal.  At this point, I  completed the diagnostic laparoscopy and had the CO2 escape from the  abdomen.  Prior to removing the umbilical port, the 5-mm port was  removed under direct laparoscopic visualization.  There was no bleeding  noted from this site.  The fascia at the umbilicus was closed with a  figure-of-eight 0 Vicryl suture and a subcuticular closure was done on  both sites.  Steri-Strips were placed.  Attention was then entered to  the vagina where the Hulka  manipulator was removed.  A speculum was  again placed and a single-tooth tenaculum was placed in the anterior lip  of the cervix.  The nulliparous cervix was gradually and easily dilated  to accommodate a diagnostic hysteroscope.  Hysteroscopy showed several  small polyps.  These were documented with photographs as were the  intraperitoneal findings.  D&C was then done with a sharp curette.  Hysteroscopy confirmed complete emptying of the uterine  cavity.  The tenaculum was removed.  No bleeding was noted from this  site.  She was taken to the recovery room in stable condition after  being extubated.  The instrument, sponge, and needle counts were  correct.  She tolerated the procedure well.      Allie Bossier, MD  Electronically Signed     MCD/MEDQ  D:  08/07/2007  T:  08/08/2007  Job:  [id number]

## 2010-06-24 ENCOUNTER — Ambulatory Visit (HOSPITAL_COMMUNITY)
Admission: RE | Admit: 2010-06-24 | Discharge: 2010-06-24 | Disposition: A | Discharge status: Home / Self Care | Payer: Self-pay | Source: Ambulatory Visit | Attending: Family Medicine | Admitting: Family Medicine

## 2010-06-24 DIAGNOSIS — Z1231 Encounter for screening mammogram for malignant neoplasm of breast: Secondary | ICD-10-CM

## 2010-06-26 NOTE — Op Note (Signed)
NAME:  [name], [name]                         ACCOUNT NO.:  [id number]   MEDICAL RECORD NO.:  [id number]                   PATIENT TYPE:  AMB   LOCATION:  DSC                                  FACILITY:  [facility]   PHYSICIAN:  Rose Phi. Maple Hudson, M.D.                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   OPERATION:  Excision of right breast mass.   SURGEON:  Rose Phi. Maple Hudson, M.D.   ANESTHESIA:  General.   OPERATIVE PROCEDURE:  Because of multiple allergies including to local  anesthetics, general anesthetic was used.  After suitable general anesthesia  was induced, the patient was placed in the supine position and the right  breast prepped and draped in the usual fashion.  The palpable abnormal area  was at the 6 o'clock position of the right breast.  A curved incision was  then made and the lumpy area of the breast excised.  Hemostasis obtained  with the cautery.  Subcuticular closure of 4-0 Monocryl and Steri-Strips  carried out.  Dressing applied.  The patient transferred to the recovery  room in satisfactory condition, having tolerated the procedure well.                                               Rose Phi. Maple Hudson, M.D.    PRY/MEDQ  D:  04/16/2003  T:  04/16/2003  Job:  [id number]

## 2010-06-26 NOTE — Op Note (Signed)
[name], [name]               ACCOUNT NO.:  [id number]   MEDICAL RECORD NO.:  [id number]          PATIENT TYPE:  AMB   LOCATION:  ENDO                         FACILITY:  [facility]   PHYSICIAN:  Petra Kuba, M.D.    DATE OF BIRTH:  [birth year]   DATE OF PROCEDURE:  09/07/2005  DATE OF DISCHARGE:                                 OPERATIVE REPORT   PROCEDURE:  Colonoscopy with biopsy.   INDICATION:  Change in bowel habits, family history of colon polyps,  multiple other GI complaints.  Consent was signed after risks, benefits,  methods, and options were thoroughly discussed in the office.   MEDICINES USED:  Fentanyl 125, Versed 12, Phenergan 25.   PROCEDURE:  Rectal exam is pertinent for external hemorrhoids, small,  digital exam was negative.  The video pediatric adjustable colonoscope was  inserted and with some difficulty due to a tortuous colon, with abdominal  pressure was able to be advanced to the cecum.  Other than a rare early left-  sided diverticula, no other abnormalities were seen on insertion.  The cecum  was identified by the appendiceal orifice and ileocecal valve.  The scope  was inserted a short ways into the terminal ileum which was normal. Photo  documentation was obtained.  Random GI biopsies and random colon biopsies  were obtained as we slowly withdrew back to the rectum and put in separate  containers.  The prep was adequate.  There was some liquid stool that  required washing and suctioning. On slow withdrawal through the colon, other  than the rare early left-sided diverticula and some sigmoid tortuosity, no  abnormalities were seen.  Once back in the rectum, anorectal pull-through  and retroflexion confirmed some small hemorrhoids.  The scope was  straightened, air was suctioned, and scope removed.  The patient tolerated  the procedure adequately.  There was no obvious immediate complication.   ENDOSCOPIC DIAGNOSES:  1.  Internal and external  hemorrhoids.  2.  Rare early left-sided diverticula tortuosity.  3.  Otherwise, within normal limits to the terminal ileum status post random      biopsies throughout.   PLAN:  Await pathology, follow up p.r.n. or in two months to recheck  symptoms, make sure no further workup plans are needed. Repeat colon  screening with a family history of colon polyps in five years.  Consider  Diprivan and virtual colonoscopy at that juncture.           ______________________________  Petra Kuba, M.D.     MEM/MEDQ  D:  09/07/2005  T:  09/07/2005  Job:  [id number]   cc:   Neta Mends. Fabian Sharp, M.D. [facility]

## 2010-06-26 NOTE — Procedures (Signed)
[name], [name]               ACCOUNT NO.:  [id number]   MEDICAL RECORD NO.:  [id number]          PATIENT TYPE:  OUT   LOCATION:  SLEEP CENTER                 FACILITY:  [facility]   PHYSICIAN:  Clinton D. Maple Hudson, M.D. DATE OF BIRTH:  24-Oct-1955   DATE OF STUDY:  05/12/2004                              NOCTURNAL POLYSOMNOGRAM   REFERRING PHYSICIAN:  Clinton D. Maple Hudson, M.D.   STUDY DATE:  05/12/04   INDICATION FOR STUDY:  Hypersomnia with sleep apnea.   EPWORTH SLEEPINESS SCORE:  11/24.   BMI:  20.   WEIGHT:  117 pounds.   SLEEP ARCHITECTURE:  Total sleep time 276 minutes with sleep efficiency 70%.  Stage I was 8%, stage II was 76%, stages III and IV were absent.  REM was  16% of total sleep time.  Sleep latency 69 minutes, REM latency 89 minutes,  awake after sleep onset 51 minutes, arousal index 11.  Depakote was taken at  sleep onset.   RESPIRATORY DATA:  Respiratory disturbance index (RDI, AHI) 10.9 obstructive  events per hour indicating mild obstructive sleep apnea/hypopnea syndrome.  There were 13 obstructive apneas and 27 hypopnea's.  Events were not  positional.  REM RDI 16.  There were insufficient events to qualify for CPAP  titration by protocol on this night.   OXYGEN DATA:  Moderate snoring with oxygen desaturation to a nadir of 85%.  Mean oxygen saturation through the study was 97% on room air.   CARDIAC DATA:  Normal sinus rhythm.   MOVEMENTS/PARASOMNIA:  Occasional leg jerk with little effect on sleep.   IMPRESSION/RECOMMENDATION:  1.  Mild obstructive sleep apnea/hypopnea syndrome, RDI 10.9 per hour with      moderate snoring and oxygen      desaturation to 85%.  2.  Technician's notes detail the patient comfort and concerns with      discrepancy from her home sleep routine.      CDY/MEDQ  D:  05/17/2004 12:35:09  T:  05/17/2004 17:21:02  Job:  [id number]

## 2010-06-26 NOTE — Group Therapy Note (Signed)
[name], [name] NO.:  [id number]   MEDICAL RECORD NO.:  [id number]          PATIENT TYPE:  WOC   LOCATION:  [facility]                   FACILITY:  [facility]   PHYSICIAN:  Dorthula Perfect, MD     DATE OF BIRTH:  1955-04-16   DATE OF SERVICE:  07/29/2005                                    CLINIC NOTE   REASON FOR VISIT:  This is a 55 year old, white female, nulligravida with  last menstrual period June 9, who presents today for an annual Pap smear.  Her menstrual periods occur approximately every 5 weeks or so.  She has some  premenstrual cramping a few days before hand that last 5-7 days and the flow  is less than in the past.  She is not having hot flashes.  Her mother's  menopause was at age 68.  The patient last had a mammogram in 2005.  Her  last Pap smear has been awhile.   FAMILY HISTORY:  A maternal great aunt had breast cancer.  There is no  family history of colon or ovarian cancer.   MEDICATIONS:  As listed on medication list.   REVIEW OF SYSTEMS:  She is in normal good health.  She has no  cardiovascular, respiratory, GI or GU complaints.   PHYSICAL EXAMINATION:  VITAL SIGNS:  Blood pressure 85/64, weight 116  pounds.  NECK:  Thyroid is normal.  BREASTS:  Bilaterally normal.  No abnormalities are noted.  ABDOMEN:  Flat, soft and nontender.  No masses are palpable.  PELVIC:  External genitalia and BUS were normal.  Vaginal vault was  epithelialized as was the cervix.  Uterus is in the midline and is of normal  size and shape.  The ovaries are not palpable.  There are no separate  adnexal masses.   IMPRESSION:  Annual gynecology exam.   DISPOSITION:  1.  Pap smear.  2.  Mammogram will be scheduled.           ______________________________  Dorthula Perfect, MD     ER/MEDQ  D:  07/29/2005  T:  07/29/2005  Job:  [id number]

## 2010-06-26 NOTE — Group Therapy Note (Signed)
[name], [name]               ACCOUNT NO.:  [id number]   MEDICAL RECORD NO.:  [id number]          PATIENT TYPE:  WOC   LOCATION:  [facility]                   FACILITY:  [facility]   PHYSICIAN:  Dorthula Perfect, MD     DATE OF BIRTH:  1955-07-08   DATE OF SERVICE:  03/02/2006                                  CLINIC NOTE   GYN CLINIC NOTE   HISTORY:  This 55 year old white female, nulligravid was seen her by,  myself back in June of 2007 with a normal exam.  In early December  because of abdominal pain, weight gain, nausea, and vomiting, and some  diarrhea.  Dr. Ewing Schlein obtained an abdominal and pelvic CT scan.  The  abdominal CT scan with contrast was normal.  The pelvic CT scan with  contrast revealed no acute abnormality.  It revealed a probable  collapsing right ovarian cyst but there is no known mention of size.  It  also raises a question of a slightly thick bladder wall.  For the past  few months, the patient has been having right lower quadrant pain.  She  also has been having a lot of urinary frequency including nocturia x3.  She does not see blood her urine.  She has no burning.  GI-wise she has  some constipation having bowel movements about every 4 days.  There is  no problem with diarrhea.  There is no blood in her stool.  Of note,  when she was in her 42s, she was evaluated and treated for interstitial  cystitis with Dr. Larey Dresser.  This included multiple cystoscopies  and treatment with DMSO.  Since that time she has not had any bladder  symptomatology until recently.  Her menstrual periods are cyclic with  her last menstrual period being January 12.  Because of an upper  respiratory tract infection she is taking Augmentin and now has a  prescription for Diflucan for ayeast infection.   PHYSICAL EXAMINATION:  VITAL SIGNS:  Height 4 feet 11 inches, weight  131, blood pressure 119/67.  ABDOMEN:  Soft and nontender.  No masses are felt.  PELVIC EXAM:  External  genitalia BUS, and glands are normal.  Vaginal  vault contains a white cheesy-like discharge.  Her cervix is negative.  Bimanual exam:  Cervix is nontender.  Uterus is normal size and shape  and is nontender.  Left adnexal area is negative.  In the high right  adnexal area there is a cystic area which upon pressure causes a lot of  GI with gaseous sounds.  This is most likely the right __________, but I  can not rule out a right ovarian cyst.   IMPRESSION:  1. Urinary frequency.  2. Abdominal pain.  3. A past history of interstitial cystitis.   DISPOSITION:  1. A clean catch urine for routine __________ culture.  2. Pelvic ultrasound.  3. Return in 2 weeks for results.  4. If her studies are normal, I believe that she needs to pursue the      interstitial cystitis route rather than a complete GI workup      because  of her past history.           ______________________________  Dorthula Perfect, MD     ER/MEDQ  D:  03/02/2006  T:  03/02/2006  Job:  [id number]

## 2010-07-01 ENCOUNTER — Other Ambulatory Visit: Payer: Self-pay | Admitting: Family Medicine

## 2010-07-01 DIAGNOSIS — N644 Mastodynia: Secondary | ICD-10-CM

## 2010-07-09 ENCOUNTER — Inpatient Hospital Stay (INDEPENDENT_AMBULATORY_CARE_PROVIDER_SITE_OTHER)
Admission: RE | Admit: 2010-07-09 | Discharge: 2010-07-09 | Disposition: A | Discharge status: Home / Self Care | Payer: Self-pay | Source: Ambulatory Visit | Attending: Emergency Medicine | Admitting: Emergency Medicine

## 2010-07-09 DIAGNOSIS — S139XXA Sprain of joints and ligaments of unspecified parts of neck, initial encounter: Secondary | ICD-10-CM

## 2010-07-09 DIAGNOSIS — R51 Headache: Secondary | ICD-10-CM

## 2010-07-22 ENCOUNTER — Ambulatory Visit
Admission: RE | Admit: 2010-07-22 | Discharge: 2010-07-22 | Disposition: A | Discharge status: Home / Self Care | Payer: Self-pay | Source: Ambulatory Visit | Attending: Family Medicine | Admitting: Family Medicine

## 2010-07-22 ENCOUNTER — Other Ambulatory Visit: Payer: Self-pay | Admitting: Family Medicine

## 2010-07-22 DIAGNOSIS — N644 Mastodynia: Secondary | ICD-10-CM

## 2010-09-08 ENCOUNTER — Inpatient Hospital Stay (INDEPENDENT_AMBULATORY_CARE_PROVIDER_SITE_OTHER)
Admission: RE | Admit: 2010-09-08 | Discharge: 2010-09-08 | Disposition: A | Discharge status: Home / Self Care | Payer: Self-pay | Source: Ambulatory Visit | Attending: Emergency Medicine | Admitting: Emergency Medicine

## 2010-09-08 DIAGNOSIS — S139XXA Sprain of joints and ligaments of unspecified parts of neck, initial encounter: Secondary | ICD-10-CM

## 2010-10-22 ENCOUNTER — Inpatient Hospital Stay (INDEPENDENT_AMBULATORY_CARE_PROVIDER_SITE_OTHER)
Admission: RE | Admit: 2010-10-22 | Discharge: 2010-10-22 | Disposition: A | Discharge status: Home / Self Care | Payer: Self-pay | Source: Ambulatory Visit | Attending: Family Medicine | Admitting: Family Medicine

## 2010-10-22 DIAGNOSIS — J069 Acute upper respiratory infection, unspecified: Secondary | ICD-10-CM

## 2010-10-22 DIAGNOSIS — K5289 Other specified noninfective gastroenteritis and colitis: Secondary | ICD-10-CM

## 2010-11-05 LAB — CBC
Platelets: 215
RDW: 12.7

## 2011-04-16 ENCOUNTER — Emergency Department (HOSPITAL_COMMUNITY): Payer: Self-pay

## 2011-04-16 ENCOUNTER — Encounter (HOSPITAL_COMMUNITY): Payer: Self-pay | Admitting: *Deleted

## 2011-04-16 ENCOUNTER — Emergency Department (HOSPITAL_COMMUNITY)
Admission: EM | Admit: 2011-04-16 | Discharge: 2011-04-16 | Disposition: A | Discharge status: Home Health | Payer: Self-pay | Attending: Emergency Medicine | Admitting: Emergency Medicine

## 2011-04-16 DIAGNOSIS — S8000XA Contusion of unspecified knee, initial encounter: Secondary | ICD-10-CM | POA: Insufficient documentation

## 2011-04-16 DIAGNOSIS — W19XXXA Unspecified fall, initial encounter: Secondary | ICD-10-CM

## 2011-04-16 DIAGNOSIS — IMO0002 Reserved for concepts with insufficient information to code with codable children: Secondary | ICD-10-CM | POA: Insufficient documentation

## 2011-04-16 DIAGNOSIS — M25469 Effusion, unspecified knee: Secondary | ICD-10-CM | POA: Insufficient documentation

## 2011-04-16 DIAGNOSIS — W101XXA Fall (on)(from) sidewalk curb, initial encounter: Secondary | ICD-10-CM | POA: Insufficient documentation

## 2011-04-16 DIAGNOSIS — M25569 Pain in unspecified knee: Secondary | ICD-10-CM | POA: Insufficient documentation

## 2011-04-16 DIAGNOSIS — S80211A Abrasion, right knee, initial encounter: Secondary | ICD-10-CM

## 2011-04-16 DIAGNOSIS — S8001XA Contusion of right knee, initial encounter: Secondary | ICD-10-CM

## 2011-04-16 DIAGNOSIS — Z79899 Other long term (current) drug therapy: Secondary | ICD-10-CM | POA: Insufficient documentation

## 2011-04-16 MED ORDER — ACETAMINOPHEN 325 MG PO TABS
650.0000 mg | ORAL_TABLET | Freq: Once | ORAL | Status: AC
  Administered 2011-04-16: 650 mg via ORAL
  Filled 2011-04-16: qty 2

## 2011-04-16 MED ORDER — TETANUS-DIPHTH-ACELL PERTUSSIS 5-2.5-18.5 LF-MCG/0.5 IM SUSP
0.5000 mL | Freq: Once | INTRAMUSCULAR | Status: AC
  Administered 2011-04-16: 0.5 mL via INTRAMUSCULAR
  Filled 2011-04-16: qty 0.5

## 2011-04-16 NOTE — ED Notes (Signed)
She tripped and fell just pta  In a parking lot .  She fell down onto her rt knee.  C./o pain and swelling

## 2011-04-16 NOTE — ED Notes (Signed)
Pt /c home in NAD. Pt expressed gratitude to staff. Pt voiced understanding of d/c instructions

## 2011-04-16 NOTE — Discharge Instructions (Signed)
Return to the ED with any concerns including worsening pain, swelling, or any other alarming symptoms.

## 2011-04-16 NOTE — ED Notes (Signed)
Ortho at bedside.

## 2011-04-16 NOTE — Progress Notes (Signed)
Orthopedic Tech Progress Note Patient Details:  [name] 1955-09-24 [id number]  Other Ortho Devices Type of Ortho Device: Knee Sleeve Ortho Device Location: right knee Ortho Device Interventions: Application   Nikki Dom 04/16/2011, 7:36 PM

## 2011-04-16 NOTE — ED Notes (Signed)
Ortho paged. 

## 2011-04-16 NOTE — ED Notes (Signed)
Pt c/o swelling and pain in right knee. States it looks and feels weird. Pt states that she fell in a parking lot. Small abrasions noted at site.

## 2011-04-16 NOTE — ED Provider Notes (Signed)
History     CSN: [id number]  Arrival date & time 04/16/11  1554   First MD Initiated Contact with Patient 04/16/11 1803      Chief Complaint  Patient presents with  . Fall    (Consider location/radiation/quality/duration/timing/severity/associated sxs/prior treatment) HPI Patient presents with pain in her right knee after a mechanical fall today. She states that she tripped and fell on a curb. She denies any pain in her neck or back and denies striking her head. She's been able to bear weight without difficulty on her knee. There no associated systemic symptoms. Movement and palpation make the pain in her right knee words and there no other alleviating or modifying factors. She has taken no medications for pain prior to arrival in the ED period  History reviewed. No pertinent past medical history.  History reviewed. No pertinent past surgical history.  History reviewed. No pertinent family history.  History  Substance Use Topics  . Smoking status: Never Smoker   . Smokeless tobacco: Not on file  . Alcohol Use: Yes    OB History    Grav Para Term Preterm Abortions TAB SAB Ect Mult Living                  Review of Systems ROS reviewed and otherwise negative except for mentioned in HPI  Allergies  Aspirin; Codeine; Etodolac; Hydrocodone-acetaminophen; Indomethacin; Ivp dye; Marcaine; Pentazocine lactate; Propoxyphene n-acetaminophen; Pseudoephedrine; Sulfonamide derivatives; and Tramadol hcl  Home Medications   Current Outpatient Rx  Name Route Sig Dispense Refill  . ALBUTEROL SULFATE HFA 108 (90 BASE) MCG/ACT IN AERS Inhalation Inhale 2 puffs into the lungs every 6 (six) hours as needed. For wheezing or shortness of breath    . DIVALPROEX SODIUM ER 500 MG PO TB24 Oral Take 1,500 mg by mouth at bedtime.    Marland Kitchen GABAPENTIN PO Oral Take 1 tablet by mouth 3 (three) times daily.    Marland Kitchen HYDROCORTISONE 1 % EX CREA Topical Apply 1 application topically daily.    Marland Kitchen LEVOTHYROXINE  SODIUM 50 MCG PO TABS Oral Take 50 mcg by mouth daily.    Marland Kitchen LITHIUM CARBONATE 300 MG PO CAPS Oral Take 300 mg by mouth 2 (two) times daily with a meal.    . SINGULAIR PO Oral Take 1 tablet by mouth at bedtime.    Marland Kitchen NAPROXEN SODIUM 220 MG PO TABS Oral Take 880 mg by mouth daily as needed. Takes 4 prior to taking Maxalt for Migraine    . MAXALT PO Oral Take 1 tablet by mouth every 8 (eight) hours as needed. For migraine    . SERTRALINE HCL 100 MG PO TABS Oral Take 200 mg by mouth daily.      BP 126/43  Pulse 66  Temp(Src) 98.3 F (36.8 C) (Oral)  Resp 15  SpO2 100% Vitals reviewed Physical Exam Physical Examination: General appearance - alert, well appearing, and in no distress Mental status - alert, oriented to person, place, and time Neck - supple, no midline tenderness to palpation Chest - clear to auscultation, no wheezes, rales or rhonchi, symmetric air entry Heart - normal rate, regular rhythm, normal S1, S2, no murmurs, rubs, clicks or gallops Back exam - full range of motion, no midline tenderness Musculoskeletal - ttp over patella, also superficial abrasions and bruising over right patella, FROM, no deformity or swelling Extremities - peripheral pulses normal, no pedal edema, no clubbing or cyanosis Skin - normal coloration and turgor, no rashes, abrasions/contusion as above  ED Course  Procedures (including critical care time)  Labs Reviewed - No data to display Dg Knee Complete 4 Views Right  04/16/2011  *RADIOLOGY REPORT*  Clinical Data: Larey Seat.  Pain and swelling.  RIGHT KNEE - COMPLETE 4+ VIEW  Comparison: None  Findings: There are tricompartmental degenerative changes with joint space narrowing and osteophytic spurring.  No acute fracture, osteochondral lesion or joint effusion.  IMPRESSION: Degenerative changes but no acute fracture.  Original Report Authenticated By: P. Loralie Champagne, M.D.     1. Fall   2. Abrasion of right knee   3. Contusion of right knee        MDM  Patient presenting with pain in her right knee after a mechanical fall. There are some superficial abrasions overlying her right patella. X-ray reveals degenerative changes but no acute fracture. Her tetanus was updated. A knee sleeve was provided for comfort. She was discharged with strict return precautions. Her multiple allergies preclude her taking any other medicines except Tylenol for the discomfort I have also advised ice and rest and elevation.        Ethelda Chick, MD 04/16/11 [phone]

## 2011-04-22 ENCOUNTER — Emergency Department (HOSPITAL_COMMUNITY): Payer: Self-pay

## 2011-04-22 ENCOUNTER — Encounter (HOSPITAL_COMMUNITY): Payer: Self-pay | Admitting: *Deleted

## 2011-04-22 ENCOUNTER — Emergency Department (HOSPITAL_COMMUNITY)
Admission: EM | Admit: 2011-04-22 | Discharge: 2011-04-22 | Disposition: A | Discharge status: Home / Self Care | Payer: Self-pay | Attending: Emergency Medicine | Admitting: Emergency Medicine

## 2011-04-22 DIAGNOSIS — M25473 Effusion, unspecified ankle: Secondary | ICD-10-CM | POA: Insufficient documentation

## 2011-04-22 DIAGNOSIS — M25539 Pain in unspecified wrist: Secondary | ICD-10-CM | POA: Insufficient documentation

## 2011-04-22 DIAGNOSIS — M25476 Effusion, unspecified foot: Secondary | ICD-10-CM | POA: Insufficient documentation

## 2011-04-22 DIAGNOSIS — W19XXXA Unspecified fall, initial encounter: Secondary | ICD-10-CM | POA: Insufficient documentation

## 2011-04-22 DIAGNOSIS — S93409A Sprain of unspecified ligament of unspecified ankle, initial encounter: Secondary | ICD-10-CM | POA: Insufficient documentation

## 2011-04-22 DIAGNOSIS — M25569 Pain in unspecified knee: Secondary | ICD-10-CM | POA: Insufficient documentation

## 2011-04-22 DIAGNOSIS — M25529 Pain in unspecified elbow: Secondary | ICD-10-CM | POA: Insufficient documentation

## 2011-04-22 DIAGNOSIS — M25579 Pain in unspecified ankle and joints of unspecified foot: Secondary | ICD-10-CM | POA: Insufficient documentation

## 2011-04-22 MED ORDER — OXYCODONE-ACETAMINOPHEN 5-325 MG PO TABS: 1.0000 | ORAL_TABLET | ORAL | Status: AC | PRN

## 2011-04-22 NOTE — Discharge Instructions (Signed)
Crutch Use Your weight should be supported by holding your crutches. Do not support your weight by resting the crutches in your armpits. Putting the weight on your armpits may cause damage to the nerves of your hands and arms. Crutches should be a little below your armpits. HOME CARE   Walking:   Step with the crutches.   Swing the good leg through the crutches and slightly ahead of your body.   Going up stairs:   Step up with the good leg.   Follow with the crutches and injured leg up to the same step.   If there is a handrail:   Hold both crutches in one hand.   Place your other hand on the handrail.     Take Tylenol for mild pain or the pain medicine prescribed for bad pain. Call Dr. Magnus Ivan if having significant pain or difficulty walking in 4 or 5 days.  While placing your weight on your arms, lift your good leg to the step.   Bring the crutches and the injured leg up to that step.   Repeat for each step.   Going down stairs:   Step down with the injured leg and crutches.   Follow down with the good leg to the same step.   If you feel wobbly or nervous, sit down and inch yourself down the stairs on your bottom.   Getting up from a chair:   Hold the injured leg out in front of you.   Grab the armrest with one hand and the top of the crutches with the other hand.   Using these supports, pull yourself up to a standing position.   Reverse this procedure for sitting down.   See your doctor for follow-up care. If you are given an elastic bandage, loosen it if you have:   Numbness.   Tingling.   Puffiness (swelling).   More pain.  If you are told to use partial weight-bearing, put some (but not all) of your weight on the injured side. You will know if you are putting too much weight on the injured side if pain returns. MAKE SURE YOU:   Understand these instructions.   Will watch your condition.   Will get help right away if you are not doing well or  get worse.  Document Released: 07/14/2007 Document Revised: 01/14/2011 Document Reviewed: 04/01/2008 [facility] Patient Information 2012 [place], [place].

## 2011-04-22 NOTE — Progress Notes (Signed)
Orthopedic Tech Progress Note Patient Details:  [name] [birth year] [id number]  Other Ortho Devices Type of Ortho Device: Crutches;ASO Ortho Device Location: (R) LE Ortho Device Interventions: Application   Jennye Moccasin 04/22/2011, 10:19 PM

## 2011-04-22 NOTE — ED Notes (Signed)
Ortho at bedside for splinting and crutches.

## 2011-04-22 NOTE — ED Notes (Signed)
Pt reports tripping over root on the grass and fell forward hitting both knees, was seen here on the 8th and was discharged home. Pt reports she has increased swelling to right lower extremity, states she has had an increase in bruising. On exam pts right lower leg is twice the size and left. Denies numbness or tingling. Reports tenderness with palpation and severe discomfort with ambulation, pt has right knee sleeve on.

## 2011-04-22 NOTE — ED Notes (Signed)
Pt reports having a trip and fall on Friday, was seen in ED and discharged. Pt states xrays was taken with no dx of fractures. Pt now complaints of increase swelling to right lower leg including foot with increase bruising to right knee and ankle. Pt has positive pedal pulse palpated, pt was ambulatory with steady gait to room assignment.

## 2011-04-22 NOTE — ED Provider Notes (Addendum)
History     CSN: [id number]  Arrival date & time 04/22/11  1754   First MD Initiated Contact with Patient 04/22/11 2050      Chief Complaint  Patient presents with  . Follow-up   Chief complaint: Fall (Consider location/radiation/quality/duration/timing/severity/associated sxs/prior treatment) HPI Patient tripped and fell 6 days ago injuring right knee. Seen here and evaluated with knee x-ray which showed no acute fracture.. presents here with continued right knee pain, next day she noticed right ankle be swollen and painful also had pain in left wrist and left elbow. Treated with Tylenol without adequate pain relief. No other injury no other complaint no other associated symptoms pain is worse with movement or with weightbearing moderate in severity nonradiating sharp in quality Past Medical History  Diagnosis Date  . Bipolar 1 disorder   . Thyroid disease   . 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 Cholecystectomy   . Tonsillectomy   . Cystoscopy   . Knee surgery   . Carpal tunnel release     History reviewed. No pertinent family history.  History  Substance Use Topics  . Smoking status: Never Smoker   . Smokeless tobacco: Not on file  . Alcohol Use: No    OB History    Grav Para Term Preterm Abortions TAB SAB Ect Mult Living                  Review of Systems  Constitutional: Negative.   HENT: Negative.   Respiratory: Negative.   Cardiovascular: Negative.   Gastrointestinal: Negative.   Musculoskeletal: Positive for arthralgias.  Skin: Positive for wound.       Abrasions  Neurological: Negative.   Hematological: Negative.   Psychiatric/Behavioral: Negative.     Allergies  Aspirin; Codeine; Etodolac; Hydrocodone-acetaminophen; Indomethacin; Ivp dye; Marcaine; Pentazocine lactate; Propoxyphene n-acetaminophen; Pseudoephedrine; Sulfonamide derivatives; and Tramadol hcl  Home Medications   Current Outpatient Rx    Name Route Sig Dispense Refill  . ALBUTEROL SULFATE HFA 108 (90 BASE) MCG/ACT IN AERS Inhalation Inhale 2 puffs into the lungs every 6 (six) hours as needed. For wheezing or shortness of breath    . DIVALPROEX SODIUM ER 500 MG PO TB24 Oral Take 1,500 mg by mouth at bedtime.    Marland Kitchen GABAPENTIN 300 MG PO CAPS Oral Take 300 mg by mouth 3 (three) times daily.    Marland Kitchen HYDROCORTISONE 1 % EX CREA Topical Apply 1 application topically daily.    Marland Kitchen LEVOTHYROXINE SODIUM 50 MCG PO TABS Oral Take 50 mcg by mouth daily.    Marland Kitchen LITHIUM CARBONATE 300 MG PO CAPS Oral Take 300 mg by mouth 2 (two) times daily with a meal.    . SINGULAIR PO Oral Take 1 tablet by mouth at bedtime.    Marland Kitchen NAPROXEN SODIUM 220 MG PO TABS Oral Take 880 mg by mouth daily as needed. Takes 4 prior to taking Maxalt for Migraine    . MAXALT PO Oral Take 1 tablet by mouth every 8 (eight) hours as needed. For migraine    . SERTRALINE HCL 100 MG PO TABS Oral Take 200 mg by mouth daily.      BP 100/71  Pulse 78  Temp(Src) 98.4 F (36.9 C) (Oral)  Resp 18  SpO2 97%  Physical Exam  Nursing note and vitals reviewed. Constitutional: She appears well-developed and well-nourished.  HENT:  Head: Normocephalic and atraumatic.  Eyes: Conjunctivae are  normal. Pupils are equal, round, and reactive to light.  Neck: Neck supple. No tracheal deviation present. No thyromegaly present.  Cardiovascular: Normal rate.   No murmur heard. Pulmonary/Chest: Effort normal. No respiratory distress.  Abdominal: Soft. Bowel sounds are normal. She exhibits no distension. There is no tenderness.  Musculoskeletal: Normal range of motion. She exhibits edema. She exhibits no tenderness.       Entire spine nontender right lower extremity healing abrasions and anterior knee no soft tissue swelling no ligamentous laxity ankle swollen and tender at medial malleolus DP pulse 2+ all all other extremities without point tenderness swelling or deformity neurovascular intact. Entire  spine nontender pelvis is stable nontender  Neurological: She is alert. Coordination normal.  Skin: Skin is warm and dry. No rash noted.  Psychiatric: She has a normal mood and affect.    ED Course  Procedures (including critical care time)  Labs Reviewed - No data to display No results found.   No diagnosis found.    MDM  Will x-ray right ankle. X-rays not indicated otherwise. Discussed with patient who agrees. Patient not given narcotic pain medicine in the emergency department as she is driving home. Patient reports she can take Percocet without difficulty. Plan ASO splint for right ankle crutches, prescription Percocet Orthopedic referral Dr. Magnus Ivan Diagnosis #1 fall #2 contusions to multiple sites #3 right ankle sprain       Doug Sou, MD 04/22/11 1610  Doug Sou, MD 04/22/11 2151

## 2011-04-22 NOTE — ED Notes (Signed)
Pt denies any questions upon discharge, verbalizes understanding use of crutches.

## 2011-11-26 ENCOUNTER — Emergency Department (HOSPITAL_COMMUNITY): Payer: Self-pay

## 2011-11-26 ENCOUNTER — Observation Stay (HOSPITAL_COMMUNITY)
Admission: EM | Admit: 2011-11-26 | Discharge: 2011-11-27 | Disposition: A | Discharge status: Home / Self Care | Payer: Self-pay | Attending: Emergency Medicine | Admitting: Emergency Medicine

## 2011-11-26 ENCOUNTER — Encounter (HOSPITAL_COMMUNITY): Payer: Self-pay

## 2011-11-26 DIAGNOSIS — L02619 Cutaneous abscess of unspecified foot: Principal | ICD-10-CM | POA: Insufficient documentation

## 2011-11-26 DIAGNOSIS — B353 Tinea pedis: Secondary | ICD-10-CM | POA: Insufficient documentation

## 2011-11-26 DIAGNOSIS — L03116 Cellulitis of left lower limb: Secondary | ICD-10-CM

## 2011-11-26 LAB — CBC WITH DIFFERENTIAL/PLATELET
Basophils Absolute: 0 10*3/uL (ref 0.0–0.1)
Eosinophils Absolute: 0.2 10*3/uL (ref 0.0–0.7)
Eosinophils Relative: 2 % (ref 0–5)
Lymphs Abs: 2.2 10*3/uL (ref 0.7–4.0)
MCH: 31 pg (ref 26.0–34.0)
MCV: 93.8 fL (ref 78.0–100.0)
Platelets: 172 10*3/uL (ref 150–400)
RDW: 12.5 % (ref 11.5–15.5)

## 2011-11-26 LAB — SEDIMENTATION RATE: Sed Rate: 27 mm/hr — ABNORMAL HIGH (ref 0–22)

## 2011-11-26 LAB — COMPREHENSIVE METABOLIC PANEL
ALT: 8 U/L (ref 0–35)
Calcium: 10.1 mg/dL (ref 8.4–10.5)
Creatinine, Ser: 0.84 mg/dL (ref 0.50–1.10)
GFR calc Af Amer: 89 mL/min — ABNORMAL LOW (ref >90)
Glucose, Bld: 77 mg/dL (ref 70–99)
Sodium: 142 mEq/L (ref 135–145)
Total Protein: 7.1 g/dL (ref 6.0–8.3)

## 2011-11-26 LAB — C-REACTIVE PROTEIN: CRP: 4.2 mg/dL — ABNORMAL HIGH (ref <0.60)

## 2011-11-26 MED ORDER — ALUM SULFATE-CA ACETATE EX PACK
1.0000 | PACK | Freq: Three times a day (TID) | CUTANEOUS | Status: DC
  Administered 2011-11-26 (×2): 1 via TOPICAL
  Filled 2011-11-26: qty 1

## 2011-11-26 MED ORDER — CLINDAMYCIN PHOSPHATE 600 MG/50ML IV SOLN
600.0000 mg | Freq: Three times a day (TID) | INTRAVENOUS | Status: DC
  Administered 2011-11-26 – 2011-11-27 (×3): 600 mg via INTRAVENOUS
  Filled 2011-11-26 (×3): qty 50

## 2011-11-26 MED ORDER — OXYCODONE-ACETAMINOPHEN 5-325 MG PO TABS
1.0000 | ORAL_TABLET | Freq: Four times a day (QID) | ORAL | Status: DC | PRN
  Administered 2011-11-26 – 2011-11-27 (×4): 1 via ORAL
  Filled 2011-11-26 (×4): qty 1

## 2011-11-26 MED ORDER — TOLNAFTATE 1 % EX POWD: Freq: Two times a day (BID) | CUTANEOUS | Status: DC

## 2011-11-26 NOTE — ED Provider Notes (Signed)
History  This chart was scribed for Ward Givens, MD by Ladona Ridgel Day. This patient was seen in room TR04C/TR04C and the patient's care was started at 1053.   CSN: [id number]  Arrival date & time 11/26/11  1053   None     Chief Complaint  Patient presents with  . Foot Pain   The history is provided by the patient. No language interpreter was used.   [name] is a 56 y.o. female who presents to the Emergency Department complaining of constant gradually worsening pain/infection/sweling bottom of her left since 4 days ago with no injury but began as a blister which a friend lanced 3 days ago; moderate amounts of drainage. Now with worsened swelling/drainage between 3 th and 4 th toes. She states pain with bearing weight. She states mild fever, chills, and nausea. She usually wears cotton blend socks, sometimes nylon and normally wears leather shoes or sneakers but states she was in leather/wooden clogs for extended period of time 2 days before which gave her a blister. She states although she has redness and swelling it was her whole foot to her ankle and toes, now better.    [facility]. She denies any injuries to her foot and had TD vaccine last year. She states no previous similar episodes.  PCP [facility]  Past Medical History  Diagnosis Date  . Bipolar 1 disorder   . Thyroid disease   . 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 Cholecystectomy   . Tonsillectomy   . Cystoscopy   . Knee surgery   . Carpal tunnel release     No family history on file.  History  Substance Use Topics  . Smoking status: Never Smoker   . Smokeless tobacco: Not on file  . Alcohol Use: No  unemployed  OB History    Grav Para Term Preterm Abortions TAB SAB Ect Mult Living                  Review of Systems  Constitutional: Negative for fever and chills.  Respiratory: Negative  for shortness of breath.   Gastrointestinal: Negative for nausea and vomiting.  Skin: Positive for wound (wound on solef or her left foot between 3 rd and 4 th toes associated with infection, drainage).  Neurological: Negative for weakness.  All other systems reviewed and are negative.    Allergies  Aspirin; Bupivacaine hcl; Codeine; Etodolac; Hydrocodone-acetaminophen; Indomethacin; Ivp dye; Pentazocine lactate; Propoxyphene-acetaminophen; Pseudoephedrine; Sulfonamide derivatives; and Tramadol hcl  Home Medications   Current Outpatient Rx  Name Route Sig Dispense Refill  . ALBUTEROL SULFATE HFA 108 (90 BASE) MCG/ACT IN AERS Inhalation Inhale 2 puffs into the lungs every 6 (six) hours as needed. For wheezing or shortness of breath    . DIVALPROEX SODIUM ER 500 MG PO TB24 Oral Take 1,500 mg by mouth at bedtime.    Marland Kitchen GABAPENTIN 300 MG PO CAPS Oral Take 300 mg by mouth 3 (three) times daily.    Marland Kitchen LEVOTHYROXINE SODIUM 50 MCG PO TABS Oral Take 50 mcg by mouth daily.    Marland Kitchen LITHIUM CARBONATE 300 MG PO CAPS Oral Take 300 mg by mouth 2 (two) times daily with a meal.    . MONTELUKAST SODIUM 10 MG PO TABS Oral Take 10 mg by mouth at bedtime.    Marland Kitchen NAPROXEN SODIUM 220  MG PO TABS Oral Take 440-880 mg by mouth 2 (two) times daily as needed. Takes 4 prior to taking Maxalt for Migraine    . SERTRALINE HCL 100 MG PO TABS Oral Take 200 mg by mouth daily.    Marland Kitchen MAXALT PO Oral Take 1 tablet by mouth every 8 (eight) hours as needed. For migraine      Triage vitals: BP 118/69  Pulse 69  Temp 97.7 F (36.5 C) (Oral)  Resp 17  Ht 5' (1.524 m)  Wt 106 lb (48.081 kg)  BMI 20.70 kg/m2  SpO2 99%  Vital signs normal    Physical Exam  Nursing note and vitals reviewed. Constitutional: She is oriented to person, place, and time. She appears well-developed and well-nourished.  Non-toxic appearance. She does not appear ill. No distress.  HENT:  Head: Normocephalic and atraumatic.  Right Ear: External ear  normal.  Left Ear: External ear normal.  Nose: Nose normal. No mucosal edema or rhinorrhea.  Mouth/Throat: Mucous membranes are normal. No dental abscesses or uvula swelling.  Eyes: Conjunctivae normal and EOM are normal. Pupils are equal, round, and reactive to light.  Neck: Normal range of motion and full passive range of motion without pain. Neck supple.  Pulmonary/Chest: Effort normal. No respiratory distress. She has no rhonchi. She exhibits no crepitus.  Abdominal: Normal appearance.  Musculoskeletal: Normal range of motion. She exhibits no edema and no tenderness.       Moves all extremities well.   Neurological: She is alert and oriented to person, place, and time. She has normal strength. No cranial nerve deficit.  Skin: Skin is warm and intact. There is erythema.       White fluffy material between  3-4 toe left foot, moderate swelling, and purulent drainage from an incision on the sole near the base of these same toes. Redness around sole aspect of base of all her toes. Over dorsum of left foot almost to the bottom of the  left lateral malleolus more swollen laterally then medially.. Swelling and redness  of her 4th toe.   Psychiatric: She has a normal mood and affect. Her speech is normal and behavior is normal. Her mood appears not anxious.    ED Course  Procedures (including critical care time)   Medications  montelukast (SINGULAIR) 10 MG tablet (not administered)  clindamycin (CLEOCIN) IVPB 600 mg (600 mg Intravenous New Bag/Given 11/26/11 1313)  aluminum sulfate-calcium acetate (DOMEBORO) packet 1 packet (not administered)  tolnaftate (TINACTIN) 1 % powder (not administered)  oxyCODONE-acetaminophen (PERCOCET/ROXICET) 5-325 MG per tablet 1 tablet (1 tablet Oral Given 11/26/11 1314)    .emeds  DIAGNOSTIC STUDIES: Oxygen Saturation is 99% on room air, normal by my interpretation.    COORDINATION OF CARE: At 1255 PM Discussed treatment plan with patient which includes  right foot X-ray, blood work. Patient agrees.   12:53 Trixie Dredge PA accepts to CDU for cellulitis protocol.  Results for orders placed during the hospital encounter of 11/26/11  CBC WITH DIFFERENTIAL      Component Value Range   WBC 9.4  4.0 - 10.5 K/uL   RBC 4.00  3.87 - 5.11 MIL/uL   Hemoglobin 12.4  12.0 - 15.0 g/dL   HCT 62.9  52.8 - 41.3 %   MCV 93.8  78.0 - 100.0 fL   MCH 31.0  26.0 - 34.0 pg   MCHC 33.1  30.0 - 36.0 g/dL   RDW 24.4  01.0 - 27.2 %   Platelets 172  150 - 400 K/uL   Neutrophils Relative 65  43 - 77 %   Neutro Abs 6.1  1.7 - 7.7 K/uL   Lymphocytes Relative 23  12 - 46 %   Lymphs Abs 2.2  0.7 - 4.0 K/uL   Monocytes Relative 10  3 - 12 %   Monocytes Absolute 1.0  0.1 - 1.0 K/uL   Eosinophils Relative 2  0 - 5 %   Eosinophils Absolute 0.2  0.0 - 0.7 K/uL   Basophils Relative 0  0 - 1 %   Basophils Absolute 0.0  0.0 - 0.1 K/uL  COMPREHENSIVE METABOLIC PANEL      Component Value Range   Sodium 142  135 - 145 mEq/L   Potassium 3.7  3.5 - 5.1 mEq/L   Chloride 101  96 - 112 mEq/L   CO2 30  19 - 32 mEq/L   Glucose, Bld 77  70 - 99 mg/dL   BUN 6  6 - 23 mg/dL   Creatinine, Ser 1.61  0.50 - 1.10 mg/dL   Calcium 09.6  8.4 - 04.5 mg/dL   Total Protein 7.1  6.0 - 8.3 g/dL   Albumin 3.5  3.5 - 5.2 g/dL   AST 16  0 - 37 U/L   ALT 8  0 - 35 U/L   Alkaline Phosphatase 91  39 - 117 U/L   Total Bilirubin 0.3  0.3 - 1.2 mg/dL   GFR calc non Af Amer 77 (*) >90 mL/min   GFR calc Af Amer 89 (*) >90 mL/min   .Laboratory interpretation all normal   Dg Foot Complete Left  11/26/2011  *RADIOLOGY REPORT*  Clinical Data: Left foot pain.  Pain at fourth toe.  LEFT FOOT - COMPLETE 3+ VIEW  Comparison: None.  Findings: Joint space narrowing within the IP joints of the toes, most pronounced in the second and third toes.  Soft tissue swelling about the metatarsals.  No fracture, subluxation or dislocation.  IMPRESSION: No acute bony abnormality.   Original Report Authenticated By:  Cyndie Chime, M.D.       1. Tinea pedis of left foot   2. Cellulitis of foot, left    Pt in CDU for cellultitis protocol.   MDM  I personally performed the services described in this documentation, which was scribed in my presence. The recorded information has been reviewed and considered.  Devoria Albe, MD, Armando Gang          Ward Givens, MD 11/26/11 [phone]

## 2011-11-26 NOTE — ED Notes (Signed)
Lt. Foot dorsal area is red and swollen,  Redness and swelling extend into her toes. 5th, 4th and 3rd toes are red and swollen.  Pt. Has blisters behind her 3rd, 4th and 5 th toes.    Pt. 's lt. Foot is elevated.  She is having moderate pain to the foot, but the pain has decreased since she was medicated.  Pt. Was oob to the bathroom, gait steady

## 2011-11-26 NOTE — ED Notes (Signed)
Pt. oob to the bathroom, gait steady  Lt. Foot pain increases when walking.  Pt. Placed back into her bed and lt. Foot is elevated

## 2011-11-26 NOTE — ED Notes (Signed)
Pt presents with L foot pain since Monday.  Pt denies any injury, reports wearing wooden heeled clogs the day before, with blister beginning between L 3rd and 4th toes.  Pt's friend opened blister on Tuesday night with onset of infection the next day.  Pt reports foot is swollen, red with draining wound to bottom of foot.

## 2011-11-26 NOTE — ED Provider Notes (Signed)
Pt sent to CDU for cellulitis protocol, L foot.  Exam, patient's left foot is significant for a ruptured blistered between third and fourth toe and surrounding erythema extending up towards distal aspects of the ankle. No ankle involvement. Pedal pulse palpable. Patient otherwise in no acute distress. Left foot is mildly tender and edematous to the affected region. I elevated L foot on propped pillow.  Drawn margin around cellulitis.  Will continue protocol.    8:37 PM Pt currently in NAD.  Does sts "my temperature is elevated".  Her repeat temp is 99.7.  Pt able to tolerates PO, percocet given.  Will continue protocol.   9:25 PM Pt will be receiving her 3rd dose of clindamycin at 5:30am.  Once finish, i anticipate d/c with oral medication and pain meds.    12:09 AM Discussed pt care with Dr. Patria Mane, who will continue care and dispo as appropriate.   BP 133/71  Pulse 65  Temp 99.5 F (37.5 C) (Oral)  Resp 18  Ht 5' 0.5" (1.537 m)  Wt 106 lb (48.081 kg)  BMI 20.36 kg/m2  SpO2 98%  Nursing notes reviewed and considered in documentation  Previous records reviewed and considered  All labs/vitals reviewed and considered  xrays reviewed and considered  Results for orders placed during the hospital encounter of 11/26/11  CBC WITH DIFFERENTIAL      Component Value Range   WBC 9.4  4.0 - 10.5 K/uL   RBC 4.00  3.87 - 5.11 MIL/uL   Hemoglobin 12.4  12.0 - 15.0 g/dL   HCT 21.3  08.6 - 57.8 %   MCV 93.8  78.0 - 100.0 fL   MCH 31.0  26.0 - 34.0 pg   MCHC 33.1  30.0 - 36.0 g/dL   RDW 46.9  62.9 - 52.8 %   Platelets 172  150 - 400 K/uL   Neutrophils Relative 65  43 - 77 %   Neutro Abs 6.1  1.7 - 7.7 K/uL   Lymphocytes Relative 23  12 - 46 %   Lymphs Abs 2.2  0.7 - 4.0 K/uL   Monocytes Relative 10  3 - 12 %   Monocytes Absolute 1.0  0.1 - 1.0 K/uL   Eosinophils Relative 2  0 - 5 %   Eosinophils Absolute 0.2  0.0 - 0.7 K/uL   Basophils Relative 0  0 - 1 %   Basophils Absolute 0.0  0.0 -  0.1 K/uL  COMPREHENSIVE METABOLIC PANEL      Component Value Range   Sodium 142  135 - 145 mEq/L   Potassium 3.7  3.5 - 5.1 mEq/L   Chloride 101  96 - 112 mEq/L   CO2 30  19 - 32 mEq/L   Glucose, Bld 77  70 - 99 mg/dL   BUN 6  6 - 23 mg/dL   Creatinine, Ser 4.13  0.50 - 1.10 mg/dL   Calcium 24.4  8.4 - 01.0 mg/dL   Total Protein 7.1  6.0 - 8.3 g/dL   Albumin 3.5  3.5 - 5.2 g/dL   AST 16  0 - 37 U/L   ALT 8  0 - 35 U/L   Alkaline Phosphatase 91  39 - 117 U/L   Total Bilirubin 0.3  0.3 - 1.2 mg/dL   GFR calc non Af Amer 77 (*) >90 mL/min   GFR calc Af Amer 89 (*) >90 mL/min  SEDIMENTATION RATE      Component Value Range   Sed Rate  27 (*) 0 - 22 mm/hr  C-REACTIVE PROTEIN      Component Value Range   CRP 4.2 (*) <0.60 mg/dL   Dg Foot Complete Left  11/26/2011  *RADIOLOGY REPORT*  Clinical Data: Left foot pain.  Pain at fourth toe.  LEFT FOOT - COMPLETE 3+ VIEW  Comparison: None.  Findings: Joint space narrowing within the IP joints of the toes, most pronounced in the second and third toes.  Soft tissue swelling about the metatarsals.  No fracture, subluxation or dislocation.  IMPRESSION: No acute bony abnormality.   Original Report Authenticated By: Cyndie Chime, M.D.       Fayrene Helper, PA-C 11/27/11 0010

## 2011-11-27 MED ORDER — AMOXICILLIN-POT CLAVULANATE 875-125 MG PO TABS: 1.0000 | ORAL_TABLET | Freq: Two times a day (BID) | ORAL | Status: DC

## 2011-11-27 MED ORDER — OXYCODONE-ACETAMINOPHEN 5-325 MG PO TABS: 1.0000 | ORAL_TABLET | Freq: Four times a day (QID) | ORAL | Status: DC | PRN

## 2011-11-27 NOTE — ED Provider Notes (Signed)
Pt seen by me and followed by PA in CDU, see CDU notes  Ward Givens, MD 11/27/11 1501

## 2011-11-27 NOTE — ED Provider Notes (Addendum)
Pt has been on cellulitis protocol. Pt with open wound between 3rd and 4th toes - pt states friend had lanced a blister/blood blister in area, now w infection.  Pt states feels improved, no fever/chills. Soreness persists. Dressing changed. Discussed recheck 24 hrs.    Suzi Roots, MD 11/27/11 [phone]  Pt requests percocet rx for home. Provided.     Suzi Roots, MD 11/27/11 1012

## 2011-11-29 LAB — WOUND CULTURE: Gram Stain: NONE SEEN

## 2011-11-30 ENCOUNTER — Encounter (HOSPITAL_COMMUNITY): Payer: Self-pay | Admitting: Cardiology

## 2011-11-30 ENCOUNTER — Emergency Department (HOSPITAL_COMMUNITY)
Admission: EM | Admit: 2011-11-30 | Discharge: 2011-11-30 | Disposition: A | Discharge status: Home / Self Care | Payer: Self-pay | Attending: Emergency Medicine | Admitting: Emergency Medicine

## 2011-11-30 DIAGNOSIS — A4902 Methicillin resistant Staphylococcus aureus infection, unspecified site: Secondary | ICD-10-CM | POA: Insufficient documentation

## 2011-11-30 DIAGNOSIS — Z5987 Material hardship due to limited financial resources, not elsewhere classified: Secondary | ICD-10-CM | POA: Insufficient documentation

## 2011-11-30 DIAGNOSIS — L02619 Cutaneous abscess of unspecified foot: Secondary | ICD-10-CM | POA: Insufficient documentation

## 2011-11-30 DIAGNOSIS — Z85828 Personal history of other malignant neoplasm of skin: Secondary | ICD-10-CM | POA: Insufficient documentation

## 2011-11-30 DIAGNOSIS — L03119 Cellulitis of unspecified part of limb: Secondary | ICD-10-CM | POA: Insufficient documentation

## 2011-11-30 DIAGNOSIS — E079 Disorder of thyroid, unspecified: Secondary | ICD-10-CM | POA: Insufficient documentation

## 2011-11-30 DIAGNOSIS — Z5989 Other problems related to housing and economic circumstances: Secondary | ICD-10-CM | POA: Insufficient documentation

## 2011-11-30 DIAGNOSIS — F319 Bipolar disorder, unspecified: Secondary | ICD-10-CM | POA: Insufficient documentation

## 2011-11-30 DIAGNOSIS — Z79899 Other long term (current) drug therapy: Secondary | ICD-10-CM | POA: Insufficient documentation

## 2011-11-30 DIAGNOSIS — Z598 Other problems related to housing and economic circumstances: Secondary | ICD-10-CM | POA: Insufficient documentation

## 2011-11-30 MED ORDER — CLINDAMYCIN HCL 300 MG PO CAPS: 300.0000 mg | ORAL_CAPSULE | Freq: Four times a day (QID) | ORAL | Status: DC

## 2011-11-30 NOTE — ED Notes (Signed)
Patient is here for a follow up from her blister on her left foot that got infected. She was prescribed antibiotics that she states she is unable to afford to have filled.

## 2011-11-30 NOTE — ED Provider Notes (Signed)
History  Scribed for [employer]. Atul Delucia, MD, the patient was seen in room TR09C/TR09C. This chart was scribed by Candelaria Stagers. The patient's care started at 12:15 PM   CSN: [id number]  Arrival date & time 11/30/11  1116   First MD Initiated Contact with Patient 11/30/11 1213      Chief Complaint  Patient presents with  . Follow-up     The history is provided by the patient. No language interpreter was used.   [name] is a 56 y.o. female who presents to the Emergency Department for a follow up for a wound to the left foot that became infected.  Pt was prescribed antibiotics but states that she was unable to fill them due to lack of funds.  She states that it feels better.      Past Medical History  Diagnosis Date  . Bipolar 1 disorder   . Thyroid disease   . 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 Cholecystectomy   . Tonsillectomy   . Cystoscopy   . Knee surgery   . Carpal tunnel release     History reviewed. No pertinent family history.  History  Substance Use Topics  . Smoking status: Never Smoker   . Smokeless tobacco: Not on file  . Alcohol Use: No    OB History    Grav Para Term Preterm Abortions TAB SAB Ect Mult Living                  Review of Systems  Skin: Positive for wound (bottom of left foot).  All other systems reviewed and are negative.    Allergies  Aspirin; Bupivacaine hcl; Codeine; Etodolac; Hydrocodone-acetaminophen; Indomethacin; Ivp dye; Pentazocine lactate; Propoxyphene-acetaminophen; Pseudoephedrine; Sulfonamide derivatives; and Tramadol hcl  Home Medications   Current Outpatient Rx  Name Route Sig Dispense Refill  . ALBUTEROL SULFATE HFA 108 (90 BASE) MCG/ACT IN AERS Inhalation Inhale 2 puffs into the lungs every 6 (six) hours as needed. For wheezing or shortness of breath    . DIVALPROEX SODIUM ER 500 MG PO TB24 Oral Take 1,500 mg by mouth at bedtime.    Marland Kitchen GABAPENTIN 300 MG  PO CAPS Oral Take 300 mg by mouth 3 (three) times daily.    Marland Kitchen LEVOTHYROXINE SODIUM 50 MCG PO TABS Oral Take 50 mcg by mouth daily.    Marland Kitchen LITHIUM CARBONATE 300 MG PO CAPS Oral Take 300 mg by mouth 2 (two) times daily with a meal.    . MONTELUKAST SODIUM 10 MG PO TABS Oral Take 10 mg by mouth at bedtime.    Marland Kitchen NAPROXEN SODIUM 220 MG PO TABS Oral Take 440-880 mg by mouth 2 (two) times daily as needed. Takes 4 prior to taking Maxalt for Migraine    . MAXALT PO Oral Take 1 tablet by mouth every 8 (eight) hours as needed. For migraine    . SERTRALINE HCL 100 MG PO TABS Oral Take 200 mg by mouth daily.    Marland Kitchen CLINDAMYCIN HCL 300 MG PO CAPS Oral Take 1 capsule (300 mg total) by mouth every 6 (six) hours. 28 capsule 0    BP 117/67  Pulse 72  Temp 98 F (36.7 C) (Oral)  Resp 16  SpO2 100%  Physical Exam  Nursing note and vitals reviewed. Constitutional: She is oriented to person, place, and time. She appears well-developed and well-nourished. No distress.  HENT:  Head: Normocephalic  and atraumatic.  Eyes: EOM are normal. Pupils are equal, round, and reactive to light.  Neck: Neck supple. No tracheal deviation present.  Pulmonary/Chest: Effort normal. No respiratory distress.  Musculoskeletal: Normal range of motion. She exhibits no edema.       Erythema and minimal drainage between the third and fourth toe of the left foot.  Mild drainage.  No fluctuant.  Improvement in redness compared to demarcated area.     Neurological: She is alert and oriented to person, place, and time. No sensory deficit.  Skin: Skin is warm and dry.  Psychiatric: She has a normal mood and affect. Her behavior is normal.    ED Course  Procedures  12:16 PM Discussed continued care of warm soaks and antibiotics. Will contact case manager for assistance with first few days of antibiotics.  Pt understands and agrees.    Labs Reviewed - No data to display No results found.   1. Abscess of foot, except toe        MDM  Patient presented with an abscess on her foot. She was seen recently for same and had a culture done which showed MRSA. She not fill the antibiotic due to financial issues. Patient's Avelox been changed to to the culture and susceptibility. Case management was consulted and they were able to provide medicines for her. The foot overall appears to be improving. The redness has decreased. She be discharged followup as needed. I personally performed the services described in this documentation, which was scribed in my presence. The recorded information has been reviewed and considered.          Juliet Rude. Rubin Payor, MD 11/30/11 [phone]

## 2011-11-30 NOTE — ED Notes (Addendum)
Previous note should read positive wound culture-abundant Staphylococcus aureus not urine culture.Dr Rubin Payor notified of same because patient is now in Triage.

## 2011-11-30 NOTE — ED Notes (Signed)
+   Urine Chart sent to EDP office for review. 

## 2011-11-30 NOTE — ED Notes (Addendum)
Pt reports she is here a follow-up after having having an blister drained on her left foot last Friday. Reports she has not been able to fill her antibiotics, denies any fever. Reports some mild drainage.

## 2011-11-30 NOTE — Progress Notes (Signed)
   CARE MANAGEMENT ED NOTE 11/30/2011  Patient:  LYCIA, SACHDEVA   Account Number:  [id number]  Date Initiated:  11/30/2011  Documentation initiated by:  Fransico Michael  Subjective/Objective Assessment:     Subjective/Objective Assessment Detail:   presented to ED today for follow up visit for infection to foot.     Action/Plan:   Action/Plan Detail:   Anticipated DC Date:  11/30/2011     Status Recommendation to Physician:   Result of Recommendation:      DC Planning Services  CM consult  Medication Assistance    Choice offered to / List presented to:            Status of service:  Completed, signed off  ED Comments:   ED Comments Detail:  11/30/11-1147-J. Eaden Hettinger,RN,BSN [phone]      Patient presented to ED today for follow up for infection to foot. Received call from EDP, Dr. Rubin Payor, regarding patient's inability to fill prescription for antibiotics upon last discharge for same problem. Main pharmacy called regarding elligibility for zz fund. Patient is elligible. Prescription for clindamycin sent to be filled using zz fund. General information packet given to patient seeing as how she is a self pay. No further needs identified.

## 2012-02-08 ENCOUNTER — Emergency Department (HOSPITAL_COMMUNITY): Payer: Self-pay

## 2012-02-08 ENCOUNTER — Observation Stay (HOSPITAL_COMMUNITY)
Admission: EM | Admit: 2012-02-08 | Discharge: 2012-02-10 | Disposition: A | Discharge status: Home / Self Care | Payer: Self-pay | Attending: Internal Medicine | Admitting: Internal Medicine

## 2012-02-08 ENCOUNTER — Encounter (HOSPITAL_COMMUNITY): Payer: Self-pay | Admitting: *Deleted

## 2012-02-08 DIAGNOSIS — F319 Bipolar disorder, unspecified: Principal | ICD-10-CM | POA: Diagnosis present

## 2012-02-08 DIAGNOSIS — E039 Hypothyroidism, unspecified: Secondary | ICD-10-CM | POA: Insufficient documentation

## 2012-02-08 DIAGNOSIS — I10 Essential (primary) hypertension: Secondary | ICD-10-CM | POA: Diagnosis present

## 2012-02-08 DIAGNOSIS — R279 Unspecified lack of coordination: Secondary | ICD-10-CM | POA: Insufficient documentation

## 2012-02-08 DIAGNOSIS — Z23 Encounter for immunization: Secondary | ICD-10-CM | POA: Insufficient documentation

## 2012-02-08 DIAGNOSIS — R259 Unspecified abnormal involuntary movements: Secondary | ICD-10-CM | POA: Insufficient documentation

## 2012-02-08 DIAGNOSIS — R251 Tremor, unspecified: Secondary | ICD-10-CM | POA: Diagnosis present

## 2012-02-08 DIAGNOSIS — Z79899 Other long term (current) drug therapy: Secondary | ICD-10-CM | POA: Insufficient documentation

## 2012-02-08 HISTORY — DX: Gastro-esophageal reflux disease without esophagitis: K21.9

## 2012-02-08 HISTORY — DX: Hypothyroidism, unspecified: E03.9

## 2012-02-08 LAB — URINE MICROSCOPIC-ADD ON

## 2012-02-08 LAB — URINALYSIS, ROUTINE W REFLEX MICROSCOPIC
Protein, ur: NEGATIVE mg/dL
Specific Gravity, Urine: 1.022 (ref 1.005–1.030)
Urobilinogen, UA: 1 mg/dL (ref 0.0–1.0)
pH: 7 (ref 5.0–8.0)

## 2012-02-08 LAB — POCT I-STAT, CHEM 8
Creatinine, Ser: 1 mg/dL (ref 0.50–1.10)
Hemoglobin: 11.2 g/dL — ABNORMAL LOW (ref 12.0–15.0)
Sodium: 141 mEq/L (ref 135–145)
TCO2: 30 mmol/L (ref 0–100)

## 2012-02-08 LAB — COMPREHENSIVE METABOLIC PANEL
ALT: 8 U/L (ref 0–35)
AST: 18 U/L (ref 0–37)
Albumin: 3.7 g/dL (ref 3.5–5.2)
Alkaline Phosphatase: 73 U/L (ref 39–117)
Calcium: 9.5 mg/dL (ref 8.4–10.5)
GFR calc Af Amer: 70 mL/min — ABNORMAL LOW (ref >90)
Glucose, Bld: 99 mg/dL (ref 70–99)
Potassium: 3.8 mEq/L (ref 3.5–5.1)
Sodium: 137 mEq/L (ref 135–145)
Total Protein: 6.5 g/dL (ref 6.0–8.3)

## 2012-02-08 LAB — CBC
Hemoglobin: 11.5 g/dL — ABNORMAL LOW (ref 12.0–15.0)
MCH: 31.5 pg (ref 26.0–34.0)
MCH: 31.2 pg (ref 26.0–34.0)
MCHC: 33.8 g/dL (ref 30.0–36.0)
Platelets: 98 10*3/uL — ABNORMAL LOW (ref 150–400)
Platelets: 86 10*3/uL — ABNORMAL LOW (ref 150–400)
RBC: 3.27 MIL/uL — ABNORMAL LOW (ref 3.87–5.11)
RDW: 13.1 % (ref 11.5–15.5)

## 2012-02-08 LAB — AMMONIA: Ammonia: 46 umol/L (ref 11–60)

## 2012-02-08 LAB — LITHIUM LEVEL: Lithium Lvl: 0.92 mEq/L (ref 0.80–1.40)

## 2012-02-08 LAB — CREATININE, SERUM: Creatinine, Ser: 0.78 mg/dL (ref 0.50–1.10)

## 2012-02-08 LAB — VALPROIC ACID LEVEL: Valproic Acid Lvl: 108.4 ug/mL — ABNORMAL HIGH (ref 50.0–100.0)

## 2012-02-08 MED ORDER — DIVALPROEX SODIUM ER 500 MG PO TB24
1500.0000 mg | ORAL_TABLET | Freq: Every day | ORAL | Status: DC
  Filled 2012-02-08: qty 3

## 2012-02-08 MED ORDER — MONTELUKAST SODIUM 10 MG PO TABS
10.0000 mg | ORAL_TABLET | Freq: Every day | ORAL | Status: DC
  Administered 2012-02-08 – 2012-02-09 (×2): 10 mg via ORAL
  Filled 2012-02-08 (×4): qty 1

## 2012-02-08 MED ORDER — SENNOSIDES-DOCUSATE SODIUM 8.6-50 MG PO TABS: 1.0000 | ORAL_TABLET | Freq: Every evening | ORAL | Status: DC | PRN

## 2012-02-08 MED ORDER — SERTRALINE HCL 100 MG PO TABS
200.0000 mg | ORAL_TABLET | Freq: Every day | ORAL | Status: DC
Start: 2012-02-08 — End: 2012-02-10
  Administered 2012-02-08: 200 mg via ORAL
  Administered 2012-02-09: 100 mg via ORAL
  Administered 2012-02-10: 200 mg via ORAL
  Filled 2012-02-08 (×3): qty 2

## 2012-02-08 MED ORDER — SODIUM CHLORIDE 0.9 % IV BOLUS (SEPSIS)
1000.0000 mL | Freq: Once | INTRAVENOUS | Status: AC
  Administered 2012-02-08: 1000 mL via INTRAVENOUS

## 2012-02-08 MED ORDER — ENOXAPARIN SODIUM 40 MG/0.4ML ~~LOC~~ SOLN
40.0000 mg | SUBCUTANEOUS | Status: DC
  Administered 2012-02-08 – 2012-02-10 (×3): 40 mg via SUBCUTANEOUS
  Filled 2012-02-08 (×4): qty 0.4

## 2012-02-08 MED ORDER — ONDANSETRON HCL 4 MG PO TABS: 4.0000 mg | ORAL_TABLET | Freq: Four times a day (QID) | ORAL | Status: DC | PRN

## 2012-02-08 MED ORDER — ALBUTEROL SULFATE HFA 108 (90 BASE) MCG/ACT IN AERS
2.0000 | INHALATION_SPRAY | Freq: Four times a day (QID) | RESPIRATORY_TRACT | Status: DC | PRN
  Filled 2012-02-08: qty 6.7

## 2012-02-08 MED ORDER — LEVOTHYROXINE SODIUM 50 MCG PO TABS
50.0000 ug | ORAL_TABLET | Freq: Every day | ORAL | Status: DC
  Administered 2012-02-09 – 2012-02-10 (×2): 50 ug via ORAL
  Filled 2012-02-08 (×4): qty 1

## 2012-02-08 MED ORDER — SODIUM CHLORIDE 0.9 % IV SOLN
INTRAVENOUS | Status: DC
  Administered 2012-02-08 – 2012-02-09 (×2): via INTRAVENOUS

## 2012-02-08 MED ORDER — GABAPENTIN 300 MG PO CAPS
300.0000 mg | ORAL_CAPSULE | Freq: Three times a day (TID) | ORAL | Status: DC
  Administered 2012-02-08 – 2012-02-10 (×6): 300 mg via ORAL
  Filled 2012-02-08 (×9): qty 1

## 2012-02-08 MED ORDER — ALUM & MAG HYDROXIDE-SIMETH 200-200-20 MG/5ML PO SUSP: 30.0000 mL | Freq: Four times a day (QID) | ORAL | Status: DC | PRN

## 2012-02-08 NOTE — ED Notes (Signed)
Pt. Reports intermittent right-sided abdominal pain with intermittent dark stools. Pt. Reports shaking that has gotten worse this past week. Reports difficulty recalling words.

## 2012-02-08 NOTE — ED Provider Notes (Signed)
History     CSN: [id number]  Arrival date & time 02/08/12  0118   First MD Initiated Contact with Patient 02/08/12 [phone]      Chief Complaint  Patient presents with  . shaking all over     (Consider location/radiation/quality/duration/timing/severity/associated sxs/prior treatment) HPI Hx per PT, shaking for the last 2-3 months, worsening, affecting gait - no h/o same, followed by Dr Ladona Ridgel for bipolar on Lithium and depakote, under a lot of stress, shaking does not stop and shaking effects entire body, no caffeine or stimulants. Mod to severe symptoms.  Past Medical History  Diagnosis Date  . Bipolar 1 disorder   . Thyroid disease   . 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 Cholecystectomy   . Tonsillectomy   . Cystoscopy   . Knee surgery   . Carpal tunnel release     No family history on file.  History  Substance Use Topics  . Smoking status: Never Smoker   . Smokeless tobacco: Not on file  . Alcohol Use: No    OB History    Grav Para Term Preterm Abortions TAB SAB Ect Mult Living                  Review of Systems  Constitutional: Negative for fever and chills.  HENT: Negative for neck pain and neck stiffness.   Eyes: Negative for pain.  Respiratory: Negative for shortness of breath.   Cardiovascular: Negative for chest pain.  Gastrointestinal: Negative for abdominal pain.  Genitourinary: Negative for dysuria.  Musculoskeletal: Negative for back pain.  Skin: Negative for rash.  Neurological: Positive for tremors. Negative for headaches.  All other systems reviewed and are negative.    Allergies  Aspirin; Bupivacaine hcl; Codeine; Etodolac; Hydrocodone-acetaminophen; Indomethacin; Ivp dye; Pentazocine lactate; Propoxyphene-acetaminophen; Pseudoephedrine; Sulfonamide derivatives; and Tramadol hcl  Home Medications   Current Outpatient Rx  Name  Route  Sig  Dispense  Refill  . ALBUTEROL SULFATE HFA 108 (90  BASE) MCG/ACT IN AERS   Inhalation   Inhale 2 puffs into the lungs every 6 (six) hours as needed. For wheezing or shortness of breath         . DIVALPROEX SODIUM ER 500 MG PO TB24   Oral   Take 1,500 mg by mouth at bedtime.         Marland Kitchen GABAPENTIN 300 MG PO CAPS   Oral   Take 300 mg by mouth 3 (three) times daily.         Marland Kitchen LEVOTHYROXINE SODIUM 50 MCG PO TABS   Oral   Take 50 mcg by mouth daily.         Marland Kitchen LITHIUM CARBONATE 300 MG PO CAPS   Oral   Take 300 mg by mouth 2 (two) times daily with a meal.         . MONTELUKAST SODIUM 10 MG PO TABS   Oral   Take 10 mg by mouth at bedtime.         Marland Kitchen NAPROXEN SODIUM 220 MG PO TABS   Oral   Take 440-880 mg by mouth 2 (two) times daily as needed. Takes 4 prior to taking Maxalt for Migraine         . MAXALT PO   Oral   Take 1 tablet by mouth every 8 (eight) hours as needed. For migraine         . SERTRALINE  HCL 100 MG PO TABS   Oral   Take 200 mg by mouth daily.           BP 138/61  Temp 98.3 F (36.8 C) (Oral)  Resp 18  SpO2 96%  Physical Exam  Nursing note and vitals reviewed. Constitutional: She is oriented to person, place, and time. She appears well-developed and well-nourished.  HENT:  Head: Normocephalic and atraumatic.  Eyes: EOM are normal. Pupils are equal, round, and reactive to light.  Neck: Neck supple.  Cardiovascular: Normal heart sounds and intact distal pulses.   Pulmonary/Chest: Effort normal. No respiratory distress.  Musculoskeletal: Normal range of motion. She exhibits no edema.  Neurological: She is alert and oriented to person, place, and time.       Tremor to UEs and face. No muscle rigidity. Some facial twitching  Skin: Skin is warm and dry.  Psychiatric:       Speech clear, cooperative    ED Course  Procedures (including critical care time)  Results for orders placed during the hospital encounter of 02/08/12  LITHIUM LEVEL      Component Value Range   Lithium Lvl 0.92   0.80 - 1.40 mEq/L  VALPROIC ACID LEVEL      Component Value Range   Valproic Acid Lvl 108.4 (*) 50.0 - 100.0 ug/mL  CBC      Component Value Range   WBC 9.1  4.0 - 10.5 K/uL   RBC 3.65 (*) 3.87 - 5.11 MIL/uL   Hemoglobin 11.5 (*) 12.0 - 15.0 g/dL   HCT 16.1 (*) 09.6 - 04.5 %   MCV 93.2  78.0 - 100.0 fL   MCH 31.5  26.0 - 34.0 pg   MCHC 33.8  30.0 - 36.0 g/dL   RDW 40.9  81.1 - 91.4 %   Platelets 98 (*) 150 - 400 K/uL  COMPREHENSIVE METABOLIC PANEL      Component Value Range   Sodium 137  135 - 145 mEq/L   Potassium 3.8  3.5 - 5.1 mEq/L   Chloride 99  96 - 112 mEq/L   CO2 29  19 - 32 mEq/L   Glucose, Bld 99  70 - 99 mg/dL   BUN 11  6 - 23 mg/dL   Creatinine, Ser 7.82  0.50 - 1.10 mg/dL   Calcium 9.5  8.4 - 95.6 mg/dL   Total Protein 6.5  6.0 - 8.3 g/dL   Albumin 3.7  3.5 - 5.2 g/dL   AST 18  0 - 37 U/L   ALT 8  0 - 35 U/L   Alkaline Phosphatase 73  39 - 117 U/L   Total Bilirubin 0.3  0.3 - 1.2 mg/dL   GFR calc non Af Amer 60 (*) >90 mL/min   GFR calc Af Amer 70 (*) >90 mL/min  URINALYSIS, ROUTINE W REFLEX MICROSCOPIC      Component Value Range   Color, Urine YELLOW  YELLOW   APPearance CLOUDY (*) CLEAR   Specific Gravity, Urine 1.022  1.005 - 1.030   pH 7.0  5.0 - 8.0   Glucose, UA NEGATIVE  NEGATIVE mg/dL   Hgb urine dipstick NEGATIVE  NEGATIVE   Bilirubin Urine SMALL (*) NEGATIVE   Ketones, ur 15 (*) NEGATIVE mg/dL   Protein, ur NEGATIVE  NEGATIVE mg/dL   Urobilinogen, UA 1.0  0.0 - 1.0 mg/dL   Nitrite NEGATIVE  NEGATIVE   Leukocytes, UA MODERATE (*) NEGATIVE  POCT I-STAT, CHEM 8  Component Value Range   Sodium 141  135 - 145 mEq/L   Potassium 3.9  3.5 - 5.1 mEq/L   Chloride 100  96 - 112 mEq/L   BUN 10  6 - 23 mg/dL   Creatinine, Ser 2.95  0.50 - 1.10 mg/dL   Glucose, Bld 95  70 - 99 mg/dL   Calcium, Ion 6.21  3.08 - 1.23 mmol/L   TCO2 30  0 - 100 mmol/L   Hemoglobin 11.2 (*) 12.0 - 15.0 g/dL   HCT 65.7 (*) 84.6 - 96.2 %  URINE MICROSCOPIC-ADD ON       Component Value Range   Squamous Epithelial / LPF RARE  RARE   WBC, UA 3-6  <3 WBC/hpf   Bacteria, UA RARE  RARE   Casts HYALINE CASTS (*) NEGATIVE   Crystals CA OXALATE CRYSTALS (*) NEGATIVE   Ct Head Wo Contrast  02/08/2012  *RADIOLOGY REPORT*  Clinical Data: Diffuse shaking; difficulty with balance.  CT HEAD WITHOUT CONTRAST  Technique:  Contiguous axial images were obtained from the base of the skull through the vertex without contrast.  Comparison: MRI of the brain performed 02/07/2004  Findings: There is no evidence of acute infarction, mass lesion, or intra- or extra-axial hemorrhage on CT.  The posterior fossa, including the cerebellum, brainstem and fourth ventricle, is within normal limits.  The third and lateral ventricles, and basal ganglia are unremarkable in appearance.  The cerebral hemispheres are symmetric in appearance, with normal gray- white differentiation.  No mass effect or midline shift is seen.  There is no evidence of fracture; chronic degenerative change is noted at the temporomandibular joints bilaterally, with associated postoperative change.  The visualized portions of the orbits are within normal limits.  The paranasal sinuses and mastoid air cells are well-aerated.  No significant soft tissue abnormalities are seen.  IMPRESSION:  1.  No acute intracranial pathology seen on CT. 2.  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   Original Report Authenticated By: Tonia Ghent, M.D.     4:39 AM presentation suggests Clois Comber with normal level, d/w [facility] and rec IVfs and admit for symptoms.  Evaluating with UA, labs and imaging as above. Medicine consult - Dr. Joneen Roach to admit.  MDM   Progressive worsening tremors, on lithium and psych meds. Workup as above. Imaging and labs and UA reviewed.   IV fluids. By mouth fluids. Medical admission       Sunnie Nielsen, MD 02/08/12 [phone]

## 2012-02-08 NOTE — Progress Notes (Signed)
Patient admitted after midnight.  Chart reviewed.  Patient examined.  Patient still tremuous.  Has had some difficulty walking.  Valproic acid level 108.4.  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 Doubt lithium toxicity. Will check ammonia level. Get physical therapy and occupational therapy consultation, as well as psychiatric consultation to assist with medication management. Patient's bipolar symptoms appear controlled at this time. TSH is pending.  Crista Curb, M.D. [facility] [phone]

## 2012-02-08 NOTE — ED Notes (Signed)
The pt has been shaking all over  Since  October.  She also reports that her balance is off also

## 2012-02-08 NOTE — ED Notes (Signed)
Pt. Walked with steady gait. No problems.

## 2012-02-08 NOTE — ED Notes (Signed)
Dr. Crosley at bedside 

## 2012-02-08 NOTE — H&P (Signed)
[facility]   Chief Complaint:  Tremors   HPI: This is a 56 year old female with long-standing history of bipolar disease type I well controlled. She's been on lithium since she was in her late 78s. She states that since the end of October into September she's developed this tremor. It started as a loss of balance. She stated over the past few months it has become more frequent, more pronounced and continuous. She states it is an intention tremor but she has no rigidity., no bradykinesia. She says the tremors worse in her torso and arms when compared to her lower extremities. She denies any vertigo, lethargy, polydipsia, polyuria. She states she does have some dry mouth but no headaches. Some possible muscle weakness and nausea, but no vomiting or diarrhea. She states she has to hold one hand with the other in order to get the tremor to subside occasionally. Shows reports some occasional extremity jerks. In the ER lithium level was high normal, the ER physician Dr. Dierdre Whitney spoke with poison control and toxicology and they postulated a possible lithium toxicity at normal levels. The recommendation was IV hydration and comparing lithium levels to prior numbers and keeping the levels in roughly the same. History provided the patient was alert and oriented.  Review of Systems:  The patient denies anorexia, fever, weight loss,, vision loss, decreased hearing, hoarseness, chest pain, syncope, dyspnea on exertion, peripheral edema, balance deficits, hemoptysis, abdominal pain, melena, hematochezia, severe indigestion/heartburn, hematuria, incontinence, genital sores, muscle weakness, suspicious skin lesions, transient blindness, difficulty walking, depression, unusual weight change, abnormal bleeding, enlarged lymph nodes, angioedema, and breast masses.  Past Medical History: Past Medical History  Diagnosis Date  . Bipolar 1 disorder   . Thyroid disease   . 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 Cholecystectomy   . Tonsillectomy   . Cystoscopy   . Knee surgery   . Carpal tunnel release     Medications: Prior to Admission medications   Medication Sig Start Date End Date Taking? Authorizing Provider  albuterol (PROVENTIL HFA;VENTOLIN HFA) 108 (90 BASE) MCG/ACT inhaler Inhale 2 puffs into the lungs every 6 (six) hours as needed. For wheezing or shortness of breath   Yes Historical Provider, MD  divalproex (DEPAKOTE ER) 500 MG 24 hr tablet Take 1,500 mg by mouth at bedtime.   Yes Historical Provider, MD  gabapentin (NEURONTIN) 300 MG capsule Take 300 mg by mouth 3 (three) times daily.   Yes Historical Provider, MD  levothyroxine (SYNTHROID, LEVOTHROID) 50 MCG tablet Take 50 mcg by mouth daily.   Yes Historical Provider, MD  lithium carbonate 300 MG capsule Take 300 mg by mouth 2 (two) times daily with a meal.   Yes Historical Provider, MD  montelukast (SINGULAIR) 10 MG tablet Take 10 mg by mouth at bedtime.   Yes Historical Provider, MD  naproxen sodium (ANAPROX) 220 MG tablet Take 440-880 mg by mouth 2 (two) times daily as needed. Takes 4 prior to taking Maxalt for Migraine   Yes Historical Provider, MD  Rizatriptan Benzoate (MAXALT PO) Take 1 tablet by mouth every 8 (eight) hours as needed. For migraine   Yes Historical Provider, MD  sertraline (ZOLOFT) 100 MG tablet Take 200 mg by mouth daily.   Yes Historical Provider, MD    Allergies:   Allergies  Allergen Reactions  . Aspirin     REACTION: upset  stomach  . Bupivacaine Hcl Hives  . Codeine     REACTION: itching  . Etodolac     REACTION: rash  . Hydrocodone-Acetaminophen     REACTION: rash  . Indomethacin Other (See Comments)    Muscle spasms  . Ivp Dye (Iodinated Diagnostic Agents) Other (See Comments)    Flushed and Fever  . Pentazocine Lactate     REACTION: itching  . Propoxyphene-Acetaminophen     REACTION: itching  . Pseudoephedrine  Other (See Comments)    I fly off the walls   . Sulfonamide Derivatives Other (See Comments)    unknown  . Tramadol Hcl Other (See Comments)    unknown    Social History:  reports that she has never smoked. She does not have any smokeless tobacco history on file. She reports that she does not drink alcohol. Her drug history not on file.  Family History: Coronary artery disease  Physical Exam: Filed Vitals:   02/08/12 0122 02/08/12 0516  BP: 138/61 106/41  Pulse:  60  Temp: 98.3 F (36.8 C) 98.3 F (36.8 C)  TempSrc: Oral Oral  Resp: 18 14  SpO2: 96% 100%    General:  Alert and oriented times three, well developed and nourished, no acute distress Eyes: PERRLA, pink conjunctiva, no scleral icterus ENT: Moist oral mucosa, neck supple, no thyromegaly Lungs: clear to ascultation, no wheeze, no crackles, no use of accessory muscles Cardiovascular: regular rate and rhythm, no regurgitation, no gallops, no murmurs. No carotid bruits, no JVD Abdomen: soft, positive BS, non-tender, non-distended, no organomegaly, not an acute abdomen GU: not examined Neuro: CN II - XII grossly intact, sensation intact Musculoskeletal: strength 5/5 all extremities, no clubbing, cyanosis or edema, patient does have a fine tremor/fasciculation that can be felt on touching Skin: no rash, no subcutaneous crepitation, no decubitus Psych: appropriate patient   Labs on Admission:   [facility] 02/08/12 0336 02/08/12 0318  NA 141 137  K 3.9 3.8  CL 100 99  CO2 -- 29  GLUCOSE 95 99  BUN 10 11  CREATININE 1.00 1.03  CALCIUM -- 9.5  MG -- --  PHOS -- --    Basename 02/08/12 0318  AST 18  ALT 8  ALKPHOS 73  BILITOT 0.3  PROT 6.5  ALBUMIN 3.7   No results found for this basename: LIPASE:2,AMYLASE:2 in the last 72 hours  Basename 02/08/12 0336 02/08/12 0318  WBC -- 9.1  NEUTROABS -- --  HGB 11.2* 11.5*  HCT 33.0* 34.0*  MCV -- 93.2  PLT -- 98*   Results for Diane, Whitney (MRN  [id number]) as of 02/08/2012 06:15  Ref. Range 02/08/2012 03:18 02/08/2012 03:19  Lithium Lvl Latest Range: 0.80-1.40 mEq/L  0.92  Valproic Acid Lvl Latest Range: 50.0-100.0 ug/mL 108.4 (H)     Micro Results:  Results for Diane, DORIAN (MRN [id number]) as of 02/08/2012 06:15  Ref. Range 02/08/2012 03:28  Color, Urine Latest Range: YELLOW  YELLOW  APPearance Latest Range: CLEAR  CLOUDY (A)  Specific Gravity, Urine Latest Range: 1.005-1.030  1.022  pH Latest Range: 5.0-8.0  7.0  Glucose Latest Range: NEGATIVE mg/dL NEGATIVE  Bilirubin Urine Latest Range: NEGATIVE  SMALL (A)  Ketones, ur Latest Range: NEGATIVE mg/dL 15 (A)  Protein Latest Range: NEGATIVE mg/dL NEGATIVE  Urobilinogen, UA Latest Range: 0.0-1.0 mg/dL 1.0  Nitrite Latest Range: NEGATIVE  NEGATIVE  Leukocytes, UA Latest Range: NEGATIVE  MODERATE (A)  Hgb urine dipstick Latest Range: NEGATIVE  NEGATIVE  WBC,  UA Latest Range: <3 WBC/hpf 3-6  Squamous Epithelial / LPF Latest Range: RARE  RARE  Bacteria, UA Latest Range: RARE  RARE  Crystals Latest Range: NEGATIVE  CA OXALATE CRYSTALS (A)  Casts Latest Range: NEGATIVE  HYALINE CASTS (A)    Radiological Exams on Admission: Ct Head Wo Contrast  02/08/2012  *RADIOLOGY REPORT*  Clinical Data: Diffuse shaking; difficulty with balance.  CT HEAD WITHOUT CONTRAST  Technique:  Contiguous axial images were obtained from the base of the skull through the vertex without contrast.  Comparison: MRI of the brain performed 02/07/2004  Findings: There is no evidence of acute infarction, mass lesion, or intra- or extra-axial hemorrhage on CT.  The posterior fossa, including the cerebellum, brainstem and fourth ventricle, is within normal limits.  The third and lateral ventricles, and basal ganglia are unremarkable in appearance.  The cerebral hemispheres are symmetric in appearance, with normal gray- white differentiation.  No mass effect or midline shift is seen.  There is no evidence of  fracture; chronic degenerative change is noted at the temporomandibular joints bilaterally, with associated postoperative change.  The visualized portions of the orbits are within normal limits.  The paranasal sinuses and mastoid air cells are well-aerated.  No significant soft tissue abnormalities are seen.  IMPRESSION:  1.  No acute intracranial pathology seen on CT. 2.  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   Original Report Authenticated By: Tonia Ghent, M.D.     Assessment/Plan Present on Admission:  . Tremor Bringing for 23 hour observation Unclear etiology Patient on multiple medications that could be contributing to this, specifically Depakote, Neurontin, and Zoloft  I would I hydrate patient as recommended, lithium has been held, repeat levels in a.m. Patient has only one other lithium level done here at [facility] and it was subtherapeutic.  I have held the patient's Neurontin but Depakote and Zoloft are continued Recommend to psychiatry consult in the a.m. for additional input  Will also check a TSH level Currently doesn't Parkinson's disease . DSORD BIPOLAR I, UNSPC, MOST RECENT EPSD . HYPERTENSION  stable home medications resumed   Full code DVT prophylaxis  [name] 02/08/2012, 6:15 AM

## 2012-02-09 DIAGNOSIS — F319 Bipolar disorder, unspecified: Secondary | ICD-10-CM

## 2012-02-09 DIAGNOSIS — E039 Hypothyroidism, unspecified: Secondary | ICD-10-CM

## 2012-02-09 DIAGNOSIS — I10 Essential (primary) hypertension: Secondary | ICD-10-CM

## 2012-02-09 DIAGNOSIS — F3130 Bipolar disorder, current episode depressed, mild or moderate severity, unspecified: Secondary | ICD-10-CM

## 2012-02-09 LAB — URINE CULTURE: Colony Count: 35000

## 2012-02-09 LAB — BASIC METABOLIC PANEL
CO2: 26 mEq/L (ref 19–32)
Calcium: 8.9 mg/dL (ref 8.4–10.5)
Creatinine, Ser: 0.81 mg/dL (ref 0.50–1.10)
GFR calc non Af Amer: 80 mL/min — ABNORMAL LOW (ref >90)
Glucose, Bld: 94 mg/dL (ref 70–99)

## 2012-02-09 LAB — CBC
MCH: 31.7 pg (ref 26.0–34.0)
MCV: 96.2 fL (ref 78.0–100.0)
Platelets: 68 10*3/uL — ABNORMAL LOW (ref 150–400)
RDW: 13.6 % (ref 11.5–15.5)
WBC: 7.1 10*3/uL (ref 4.0–10.5)

## 2012-02-09 MED ORDER — DIVALPROEX SODIUM ER 500 MG PO TB24: 1500.0000 mg | ORAL_TABLET | Freq: Every day | ORAL | Status: DC

## 2012-02-09 MED ORDER — INFLUENZA VIRUS VACC SPLIT PF IM SUSP
0.5000 mL | Freq: Once | INTRAMUSCULAR | Status: AC
  Administered 2012-02-09: 0.5 mL via INTRAMUSCULAR
  Filled 2012-02-09: qty 0.5

## 2012-02-09 MED ORDER — DIVALPROEX SODIUM ER 500 MG PO TB24
1500.0000 mg | ORAL_TABLET | Freq: Every day | ORAL | Status: DC
  Administered 2012-02-09: 1500 mg via ORAL
  Filled 2012-02-09 (×2): qty 3

## 2012-02-09 MED ORDER — DIVALPROEX SODIUM 500 MG PO DR TAB: 1500.0000 mg | DELAYED_RELEASE_TABLET | Freq: Every day | ORAL | Status: DC

## 2012-02-09 NOTE — Progress Notes (Signed)
[facility] PROGRESS NOTE  [name] ZOX:[id number] DOB: 03/26/1955 DOA: 02/08/2012 PCP: Default, Provider, MD  Assessment/Plan: 1. Tremors: May be anxiety plus slightly elevated Depakote level. By holding her Depakote for one day, levels down now. Recommend restarting Depakote with 1500 mg each bedtime except for Sundays where she won't take 500. Patient admits to me that occasionally time she's not very compliant in general taking a full course of Depakote every night. 2. Bipolar disorder: Appreciate psychiatry help. Continue lithium-levels are normal, restarting Depakote 3. Hypothyroid: TSH slightly elevated, awaiting free T3 and 4 levels  Code Status: Full code Family Communication: Patient declined calling anyone else  Disposition Plan: Home with outpatient physical therapy tomorrow   Consultants:  Psychiatry  Procedures:  None  Antibiotics:  None  HPI/Subjective: Patient still feels somewhat shaky, although overall better than yesterday.  Objective: Filed Vitals:   02/08/12 1943 02/08/12 2200 02/09/12 0600 02/09/12 1357  BP:  122/45 100/40 113/51  Pulse:  52 60 62  Temp:  97.8 F (36.6 C) 98 F (36.7 C) 98 F (36.7 C)  TempSrc:  Oral Oral Oral  Resp:  16 16 16   Height: 5' 0.5" (1.537 m)     Weight: 49.896 kg (110 lb)     SpO2:  97% 100% 100%    Intake/Output Summary (Last 24 hours) at 02/09/12 1750 Last data filed at 02/09/12 0700  Gross per 24 hour  Intake   1675 ml  Output      0 ml  Net   1675 ml   Filed Weights   02/08/12 1311 02/08/12 1943  Weight: 49.896 kg (110 lb) 49.896 kg (110 lb)    Exam:   General:  Alert x3, mild distress secondary to feeling anxious, looks older than stated age fatigued  Cardiovascular: Regular rate and rhythm, S1-S2  Respiratory: Clear to auscultation bilaterally  Abdomen: Soft, nontender, nondistended, positive bowel sounds  Extremities: No clubbing or cyanosis or edema  Data Reviewed: Basic  Metabolic Panel:  Lab 02/09/12 8119 02/08/12 0917 02/08/12 0336 02/08/12 0318  NA 142 -- 141 137  K 4.3 -- 3.9 3.8  CL 110 -- 100 99  CO2 26 -- -- 29  GLUCOSE 94 -- 95 99  BUN 14 -- 10 11  CREATININE 0.81 0.78 1.00 1.03  CALCIUM 8.9 -- -- 9.5  MG -- -- -- --  PHOS -- -- -- --   Liver Function Tests:  Lab 02/08/12 0318  AST 18  ALT 8  ALKPHOS 73  BILITOT 0.3  PROT 6.5  ALBUMIN 3.7    Lab 02/08/12 1331  AMMONIA 46   CBC:  Lab 02/09/12 0740 02/08/12 0917 02/08/12 0336 02/08/12 0318  WBC 7.1 7.5 -- 9.1  NEUTROABS -- -- -- --  HGB 10.8* 10.2* 11.2* 11.5*  HCT 32.8* 30.9* 33.0* 34.0*  MCV 96.2 94.5 -- 93.2  PLT 68* 86* -- 98*     Recent Results (from the past 240 hour(s))  URINE CULTURE     Status: Normal   Collection Time   02/08/12  3:28 AM      Component Value Range Status Comment   Specimen Description URINE, CLEAN CATCH   Final    Special Requests NONE   Final    Culture  Setup Time 02/08/2012 08:49   Final    Colony Count 35,000 COLONIES/ML   Final    Culture     Final    Value: GROUP B STREP(S.AGALACTIAE)ISOLATED     Note:  TESTING AGAINST S. AGALACTIAE NOT ROUTINELY PERFORMED DUE TO PREDICTABILITY OF AMP/PEN/VAN SUSCEPTIBILITY.   Report Status 02/09/2012 FINAL   Final      Studies: Ct Head Wo Contrast  02/08/2012   IMPRESSION:  1.  No acute intracranial pathology seen on CT. 2.  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   Original Report Authenticated By: Tonia Ghent, M.D.     Scheduled Meds:   . enoxaparin (LOVENOX) injection  40 mg Subcutaneous Q24H  . gabapentin  300 mg Oral TID  . levothyroxine  50 mcg Oral QAC breakfast  . montelukast  10 mg Oral QHS  . sertraline  200 mg Oral Daily   Continuous Infusions:   . sodium chloride 125 mL/hr at 02/09/12 5784    Active Problems:  DSORD BIPOLAR I, UNSPC, MOST RECENT EPSD  HYPERTENSION  Tremor    Time spent: 25  minutes    Hollice Espy  [facility] Pager [phone]. If 8PM-8AM, please contact night-coverage at [email or web address], password [facility] 02/09/2012, 5:50 PM  LOS: 1 day

## 2012-02-09 NOTE — Evaluation (Signed)
Agree with PT evaluation.  Kessie Croston, PT DPT [phone]  

## 2012-02-09 NOTE — Evaluation (Signed)
Physical Therapy Evaluation Patient Details Name: [name] MRN: [id number] DOB: 01/28/1956 Today's Date: 02/09/2012 Time: 4098-1191 PT Time Calculation (min): 41 min  PT Assessment / Plan / Recommendation Clinical Impression  Pt with intention tremor possibly due to medications, presents today stating her tremor has decr and she feels better.  Demonstrated good LE static strength and balance, however LLE dynamic strength and balance is weaker than RLE; primarily limited in dynamic standing and high level balance activities.  Recommend OP PT at this time to maximize her safety after d/c.     PT Assessment  Patient needs continued PT services    Follow Up Recommendations  Outpatient PT          Equipment Recommendations  None recommended by PT       Frequency Min 2X/week    Precautions / Restrictions Precautions Precautions: None Restrictions Weight Bearing Restrictions: No   Pertinent Vitals/Pain VSS, no pain.       Mobility  Bed Mobility Bed Mobility: Supine to Sit;Sitting - Scoot to Edge of Bed Supine to Sit: 7: Independent Sitting - Scoot to Edge of Bed: 7: Independent Details for Bed Mobility Assistance: no difficulties Transfers Transfers: Sit to Stand;Stand to Sit Sit to Stand: 7: Independent;Without upper extremity assist;From bed;From toilet Stand to Sit: 7: Independent;Without upper extremity assist;To bed;To chair/3-in-1;To toilet Details for Transfer Assistance: no difficulties, denies dizziness, denies any falls Ambulation/Gait Ambulation/Gait Assistance: 5: Supervision Ambulation Distance (Feet): 500 Feet Assistive device: None Ambulation/Gait Assistance Details: Pt walks with varied gait speed and tends to sway intermitently without lob, she states this is normal and a friend told her that it is because she doesn't eat enough (which she quickly stated that she does eat enough).  Gait Pattern: Step-through pattern Gait velocity: varied Stairs:  Yes Stairs Assistance: 6: Modified independent (Device/Increase time) (pt states this is her baseline) Stair Management Technique: One rail Left Number of Stairs: 2  Wheelchair Mobility Wheelchair Mobility: No           PT Diagnosis: Abnormality of gait  PT Problem List: Decreased balance PT Treatment Interventions: Gait training;Stair training;Functional mobility training;Therapeutic activities;Therapeutic exercise;Balance training   PT Goals Acute Rehab PT Goals PT Goal Formulation: With patient Time For Goal Achievement: 02/16/12 Potential to Achieve Goals: Good Pt will Ambulate: >150 feet;Independently PT Goal: Ambulate - Progress: Goal set today Additional Goals Additional Goal #1: Will be independent with dynamic standing balance for . PT Goal: Additional Goal #1 - Progress: Goal set today Additional Goal #2: Will perform high level balance activities (gait pivot turn, head turns) independently. PT Goal: Additional Goal #2 - Progress: Goal set today  Visit Information  Last PT Received On: 02/09/12 Assistance Needed: +1    Subjective Data  Subjective: My tremor is getting better.  I don't feel the down heavines that I have in the past when I have been depression; I just feel it moving.  Patient Stated Goal: to return to her normal   Prior Functioning  Home Living Lives With: Alone Available Help at Discharge: Neighbor;Available PRN/intermittently Type of Home: Mobile home Home Access: Stairs to enter Entrance Stairs-Number of Steps: 4 Entrance Stairs-Rails: Can reach both Home Layout: One level Bathroom Shower/Tub: Tub only Firefighter: Standard Home Adaptive Equipment: None Additional Comments: in the process of moving out of her mobile home Prior Function Level of Independence: Independent Able to Take Stairs?: Yes Driving: Yes Vocation: Unemployed Communication Communication: No difficulties Dominant Hand: Left    Cognition  Overall Cognitive  Status: Appears within functional limits for tasks assessed/performed Arousal/Alertness: Awake/alert Orientation Level: Oriented X4 / Intact Behavior During Session: WFL for tasks performed Cognition - Other Comments: pt is analytical and explains answers at length    Extremity/Trunk Assessment Right Upper Extremity Assessment RUE ROM/Strength/Tone: [facility] for tasks assessed Left Upper Extremity Assessment LUE ROM/Strength/Tone: WFL for tasks assessed Right Lower Extremity Assessment RLE ROM/Strength/Tone: Within functional levels RLE Sensation: WFL - Light Touch RLE Coordination: WFL - gross motor Left Lower Extremity Assessment LLE ROM/Strength/Tone: Within functional levels LLE Sensation: Deficits LLE Sensation Deficits: deminished sensation compared to RLE but intact LLE Coordination: WFL - gross motor Trunk Assessment Trunk Assessment: Normal   Balance Balance Balance Assessed: Yes Static Sitting Balance Static Sitting - Balance Support: No upper extremity supported;Feet supported Static Sitting - Level of Assistance: 7: Independent Static Sitting - Comment/# of Minutes: EOB no difficulties Dynamic Sitting Balance Dynamic Sitting - Balance Support: Bilateral upper extremity supported;Feet supported Dynamic Sitting - Level of Assistance: 7: Independent Dynamic Sitting Balance - Compensations: EOB during MMT without difficulties or lob Dynamic Sitting - Comments: performed self care; toileting, cleaning self, changing pad, all without difficulty Static Standing Balance Static Standing - Balance Support: No upper extremity supported Static Standing - Level of Assistance: 7: Independent Static Standing - Comment/# of Minutes: quiet standing while in conversation, no lob Single Leg Stance - Right Leg:  (15s) Single Leg Stance - Left Leg:  (5s) Tandem Stance - Right Leg:  (10s) Tandem Stance - Left Leg:  (10s) Dynamic Standing Balance Dynamic Standing - Balance Support: No  upper extremity supported Dynamic Standing - Level of Assistance: 6: Modified independent (Device/Increase time) Dynamic Standing - Balance Activities: Reaching across midline;Reaching for objects;Other (comment) (self care (hand washing, replace pad in underpants)) Dynamic Standing - Comments: no lob, req incr time, able to complete without need of A or vc High Level Balance High Level Balance Activites: Head turns;Other (comment) (change in gait speed) High Level Balance Comments: pt remains with mild postural sway in gait but is able to quickly and seemlessly change speed, practically running 28ft to demonstrate how fast she walks when shopping or doing an activity that req quick walking  End of Session PT - End of Session Activity Tolerance: Patient tolerated treatment well Patient left: in chair;with call bell/phone within reach Nurse Communication: Mobility status  GP     Sharion Balloon 02/09/2012, 9:52 AM  Sharion Balloon, SPT Acute Rehab Services [phone]

## 2012-02-09 NOTE — Progress Notes (Signed)
Physical Therapy G-Codes (for Evaluation on 03/07/11)  2012/03/06 0846  PT G-Codes **NOT FOR INPATIENT CLASS**  Functional Limitation Mobility: Walking and moving around  Mobility: Walking and Moving Around Current Status (W1191) CJ  Mobility: Walking and Moving Around Goal Status [phone]) CI    [place], PT DPT [phone]
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
## 2012-02-10 MED ORDER — LITHIUM CARBONATE 300 MG PO CAPS
300.0000 mg | ORAL_CAPSULE | Freq: Two times a day (BID) | ORAL | Status: DC
  Administered 2012-02-10: 300 mg via ORAL
  Filled 2012-02-10 (×3): qty 1

## 2012-02-10 MED ORDER — LEVOTHYROXINE SODIUM 112 MCG PO TABS: 56.0000 ug | ORAL_TABLET | Freq: Every day | ORAL | Status: DC

## 2012-02-10 MED ORDER — LITHIUM CARBONATE 300 MG PO CAPS: 300.0000 mg | ORAL_CAPSULE | Freq: Two times a day (BID) | ORAL | Status: DC

## 2012-02-10 MED ORDER — ALPRAZOLAM 0.25 MG PO TABS
0.2500 mg | ORAL_TABLET | ORAL | Status: AC
  Administered 2012-02-10: 0.25 mg via ORAL
  Filled 2012-02-10: qty 1

## 2012-02-10 NOTE — Discharge Summary (Signed)
Physician Discharge Summary  [name] NWG:[id number] DOB: [birth year] DOA: 02/08/2012  PCP: Default, Provider, MD  Admit date: 02/08/2012 Discharge date: 02/10/2012  Time spent: 25 minutes  Recommendations for Outpatient Follow-up:  1. Outpatient PT  Discharge Diagnoses:  Active Problems:  DSORD BIPOLAR I, UNSPC, MOST RECENT EPSD  HYPERTENSION  Tremor   Discharge Condition: Improved, being discharged to home  Diet recommendation: Low sodium  Filed Weights   02/08/12 1311 02/08/12 1943  Weight: 49.896 kg (110 lb) 49.896 kg (110 lb)    History of present illness:  This is a 57 year old female with long-standing history of bipolar disease type I well controlled. She's been on lithium since she was in her late 34s. She states that since the end of October into September she's developed this tremor. It started as a loss of balance. She stated over the past few months it has become more frequent, more pronounced and continuous. She states it is an intention tremor but she has no rigidity., no bradykinesia. She says the tremors worse in her torso and arms when compared to her lower extremities. She denies any vertigo, lethargy, polydipsia, polyuria. She states she does have some dry mouth but no headaches. Some possible muscle weakness and nausea, but no vomiting or diarrhea. She states she has to hold one hand with the other in order to get the tremor to subside occasionally. Shows reports some occasional extremity jerks. In the ER lithium level was high normal, the ER physician Dr. Dierdre Highman spoke with poison control and toxicology and they postulated a possible lithium toxicity at normal levels. The recommendation was IV hydration and comparing lithium levels to prior numbers and keeping the levels in roughly the same. History provided the patient was alert and oriented.  Hospital Course:  1. Tremors: May be anxiety plus slightly elevated Depakote level. By holding her Depakote for one  day, levels down now. Recommend restarting Depakote with 1500 mg each bedtime except for Sundays where she won't take 500. Patient admits to me that occasionally time she's not very compliant in general taking a full course of Depakote every night. 2. Bipolar disorder: Appreciate psychiatry help. Continue lithium-levels are normal, restarting Depakote 3. Hypothyroid: TSH slightly elevated, FT4 slightly low.  Increased Synthroid from to (1/2 of a 112 mcg tablet)  Procedures:  None  Consultations:  Psychiatry  Discharge Exam: Filed Vitals:   02/09/12 0600 02/09/12 1357 02/09/12 2122 02/10/12 0647  BP: 100/40 113/51 109/57 100/50  Pulse: 60 62 65 56  Temp: 98 F (36.7 C) 98 F (36.7 C) 97.1 F (36.2 C) 97.9 F (36.6 C)  TempSrc: Oral Oral Oral Oral  Resp: 16 16 18 16   Height:      Weight:      SpO2: 100% 100% 100% 96%    General: A&O x 3, NAD Cardiovascular: RRR S1S2 Respiratory: CTA Bilaterally Abd: Soft, NT, ND, +BS Ext: No clubbing, cyanosis or edema  Discharge Instructions  Discharge Orders    Future Orders Please Complete By Expires   Diet - low sodium heart healthy      Increase activity slowly          Medication List     As of 02/10/2012 11:26 AM    TAKE these medications         albuterol 108 (90 BASE) MCG/ACT inhaler   Commonly known as: PROVENTIL HFA;VENTOLIN HFA   Inhale 2 puffs into the lungs every 6 (six) hours as needed. For wheezing  or shortness of breath      divalproex 500 MG 24 hr tablet   Commonly known as: DEPAKOTE ER   Take 3 tablets (1,500 mg total) by mouth at bedtime. On Sundays, take 1 pill (500 mg) at night.  Rest of the week, take 1500 mg (3 pills) at night.      gabapentin 300 MG capsule   Commonly known as: NEURONTIN   Take 300 mg by mouth 3 (three) times daily.      levothyroxine 112 MCG tablet   Commonly known as: SYNTHROID, LEVOTHROID   Take 0.5 tablets (56 mcg total) by mouth daily.      lithium carbonate 300  MG capsule   Take 300 mg by mouth 2 (two) times daily with a meal.      MAXALT PO   Take 1 tablet by mouth every 8 (eight) hours as needed. For migraine      montelukast 10 MG tablet   Commonly known as: SINGULAIR   Take 10 mg by mouth at bedtime.      naproxen sodium 220 MG tablet   Commonly known as: ANAPROX   Take 440-880 mg by mouth 2 (two) times daily as needed. Takes 4 prior to taking Maxalt for Migraine      sertraline 100 MG tablet   Commonly known as: ZOLOFT   Take 200 mg by mouth daily.           Follow-up Information    Follow up with Benjaman Pott, MD. In 1 month. (As needed)    Contact information:   [address] [place] [address] [phone]           The results of significant diagnostics from this hospitalization (including imaging, microbiology, ancillary and laboratory) are listed below for reference.    Significant Diagnostic Studies: Ct Head Wo Contrast  02/08/2012    IMPRESSION:  1.  No acute intracranial pathology seen on CT. 2.  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   Original Report Authenticated By: Tonia Ghent, M.D.     Microbiology: Recent Results (from the past 240 hour(s))  URINE CULTURE     Status: Normal   Collection Time   02/08/12  3:28 AM      Component Value Range Status Comment   Specimen Description URINE, CLEAN CATCH   Final    Special Requests NONE   Final    Culture  Setup Time 02/08/2012 08:49   Final    Colony Count 35,000 COLONIES/ML   Final    Culture     Final    Value: GROUP B STREP(S.AGALACTIAE)ISOLATED     Note: TESTING AGAINST S. AGALACTIAE NOT ROUTINELY PERFORMED DUE TO PREDICTABILITY OF AMP/PEN/VAN SUSCEPTIBILITY.   Report Status 02/09/2012 FINAL   Final      Labs: Basic Metabolic Panel:  Lab 02/09/12 2130 02/08/12 0917 02/08/12 0336 02/08/12 0318  NA 142 -- 141 137  K 4.3 -- 3.9 3.8  CL 110 -- 100 99  CO2 26 -- -- 29    GLUCOSE 94 -- 95 99  BUN 14 -- 10 11  CREATININE 0.81 0.78 1.00 1.03  CALCIUM 8.9 -- -- 9.5  MG -- -- -- --  PHOS -- -- -- --   Liver Function Tests:  Lab 02/08/12 0318  AST 18  ALT 8  ALKPHOS 73  BILITOT 0.3  PROT 6.5  ALBUMIN 3.7    Lab 02/08/12 1331  AMMONIA 46  CBC:  Lab 02/09/12 0740 02/08/12 0917 02/08/12 0336 02/08/12 0318  WBC 7.1 7.5 -- 9.1  NEUTROABS -- -- -- --  HGB 10.8* 10.2* 11.2* 11.5*  HCT 32.8* 30.9* 33.0* 34.0*  MCV 96.2 94.5 -- 93.2  PLT 68* 86* -- 98*     Signed:  KRISHNAN,SENDIL K  [facility] 02/10/2012, 11:26 AM

## 2012-02-10 NOTE — Progress Notes (Signed)
Patient Identification:  [name] Date of Evaluation:  02/10/2012 Reason for Consult:  Tremors  Referring Provider: Dr. Rito Ehrlich  History of Present Illness:Pt is admitted for progressive worsening of tremors that now cause spilling her cup (of liquid)  Past Psychiatric History:She has a chronic history of bipolar disorder prominent symptoms of depression.  Past Medical History:     Past Medical History  Diagnosis Date  . Bipolar 1 disorder   . Thyroid disease   . 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 Complication of anesthesia   . Asthma   . Hypothyroidism   . Shortness of breath   . Sleep apnea   . GERD (gastroesophageal reflux disease)   . Headache   . Neuromuscular disorder 02/08/2012    TREMORS   . Arthritis        Past Surgical History  Procedure Date  . Cholecystectomy   . Tonsillectomy   . Cystoscopy   . Knee surgery   . Carpal tunnel release     Allergies:  Allergies  Allergen Reactions  . Aspirin     REACTION: upset stomach  . Bupivacaine Hcl Hives  . Codeine     REACTION: itching  . Etodolac     REACTION: rash  . Hydrocodone-Acetaminophen     REACTION: rash  . Indomethacin Other (See Comments)    Muscle spasms  . Ivp Dye (Iodinated Diagnostic Agents) Other (See Comments)    Flushed and Fever  . Pentazocine Lactate     REACTION: itching  . Propoxyphene-Acetaminophen     REACTION: itching  . Pseudoephedrine Other (See Comments)    I fly off the walls   . Sulfonamide Derivatives Other (See Comments)    unknown  . Tramadol Hcl Other (See Comments)    unknown    Current Medications:  Prior to Admission medications   Medication Sig Start Date End Date Taking? Authorizing Provider  albuterol (PROVENTIL HFA;VENTOLIN HFA) 108 (90 BASE) MCG/ACT inhaler Inhale 2 puffs into the lungs every 6 (six) hours as needed. For wheezing or shortness of breath   Yes Historical Provider, MD  gabapentin (NEURONTIN) 300 MG capsule Take 300 mg by mouth 3  (three) times daily.   Yes Historical Provider, MD  lithium carbonate 300 MG capsule Take 300 mg by mouth 2 (two) times daily with a meal.   Yes Historical Provider, MD  montelukast (SINGULAIR) 10 MG tablet Take 10 mg by mouth at bedtime.   Yes Historical Provider, MD  naproxen sodium (ANAPROX) 220 MG tablet Take 440-880 mg by mouth 2 (two) times daily as needed. Takes 4 prior to taking Maxalt for Migraine   Yes Historical Provider, MD  Rizatriptan Benzoate (MAXALT PO) Take 1 tablet by mouth every 8 (eight) hours as needed. For migraine   Yes Historical Provider, MD  sertraline (ZOLOFT) 100 MG tablet Take 200 mg by mouth daily.   Yes Historical Provider, MD  divalproex (DEPAKOTE ER) 500 MG 24 hr tablet Take 3 tablets (1,500 mg total) by mouth at bedtime. On Sundays, take 1 pill (500 mg) at night.  Rest of the week, take 1500 mg (3 pills) at night. 02/09/12   Hollice Espy, MD  levothyroxine (SYNTHROID) 112 MCG tablet Take 0.5 tablets (56 mcg total) by mouth daily. 02/10/12   Hollice Espy, MD    Social History:    reports that she has never smoked. She has never used smokeless tobacco. She reports that she does  not drink alcohol or use illicit drugs.   Family History:    Family History  Problem Relation Age of Onset  . Coronary artery disease      Mental Status Examination/Evaluation: Objective:  Appearance: Casual and Neat  Eye Contact::  Good  Speech:  Clear and Coherent and Normal Rate  Volume:  Normal  Mood:  bright  Affect:  Appropriate and Congruent  Thought Process:  Coherent, Goal Directed and Logical  Orientation:  Full (Time, Place, and Person)  Thought Content:  logical and motivated to return to MA  course  Suicidal Thoughts:  No  Homicidal Thoughts:  No  Judgement:  Intact  Insight:  Good   DIAGNOSIS:   AXIS I   Bipolar Disorder; tremor due to related medical consitions  AXIS II  Deferred  AXIS III See medical notes.  AXIS IV other psychosocial or  environmental problems and correction of hypothyroidism, continue dupportive treatment of Bipolar Disorder  AXIS V 51-60 moderate symptoms   Assessment/Plan:    Pt is awake and alert.  She has dressed and anticipates discharge to her home very soon.  She was found to have hypothyroidism.  She is given synthroid hormone medication.     She plans to return to her Master's degree in education.    She is calm and demonstrates her reduced tremor.   She holds out her hand.  The tremor is reduced compared to yesterday's tremor.   She is not suicidal.  She is able to state the date.  She interprets proverb with abstract reasoning.  She is able to concentrated for Serial 100s (- 7s).  She recalls 2 of 3 words for short term memory.  She uses good judgment for sample situation; problem solving.  RECOMMENDATION:  1.  Pt has capacity to return home and follow up with outpatient therapy she has been attending.  2.  Pt's medications:  Lithium, Zoloft, Neurontin and Depakote are resumed. 3.  No further psychiatric needs.  MD Psychiatrist signs off.  Mickeal Skinner MD 02/10/2012 11:46 AM

## 2012-02-10 NOTE — Evaluation (Signed)
Occupational Therapy Evaluation Patient Details Name: [name] MRN: [id number] DOB: 1955/08/05 Today's Date: 02/10/2012 Time: 1000-1035 OT Time Calculation (min): 35 min  OT Assessment / Plan / Recommendation Clinical Impression  Pt with intention tremor possibly due to medications, presents today stating her tremor has decr and she feels better.   Patient independent or modified independent with all functional mobility during simulated BADL tasks.  Patient denies need for OT services except with self feeding.  Patient engaged in UE evaluation, BUE exercise to improve proximal stability of scapula/shoullder area, demonstration and return demonstration of self feed and review of options to improve independence with self feeding and drinking with less spillage.    OT Assessment  Patient does not need any further OT services    Follow Up Recommendations  No OT follow up    Barriers to Discharge N/A  Equipment Recommendations  N/A   Recommendations for Other Services N/A  Precautions / Restrictions Precautions Precautions: None Restrictions Weight Bearing Restrictions: No   Pertinent Vitals/Pain Denies pain    ADL  Eating/Feeding: Performed;Modified independent Where Assessed - Eating/Feeding: Chair Grooming: Simulated;Modified independent Upper Body Bathing: Simulated;Independent Lower Body Bathing: Simulated;Modified independent Upper Body Dressing: Simulated;Modified independent Lower Body Dressing: Simulated;Modified independent Toilet Transfer: Simulated;Modified independent Toileting - Clothing Manipulation and Hygiene: Simulated;Modified independent Tub/Shower Transfer: Simulated;Modified independent Transfers/Ambulation Related to ADLs: Mod I during all functional mobility  ADL Comments: Patient only had some minimal concerns related to self feeding therefore this was the focus of eval and treatment    OT Goals N/A no further OT indicated  Visit Information  Last OT Received On: 02/10/12 Assistance Needed: +1    Subjective Data  Subjective: I feel comfortable with my independence if I was to go home today. Patient Stated Goal: Hope I am going home today   Prior Functioning     Home Living Lives With: Alone Available Help at Discharge: Neighbor;Available PRN/intermittently Type of Home: Mobile home Home Access: Stairs to enter Entrance Stairs-Number of Steps: 4 Entrance Stairs-Rails: Can reach both Home Layout: One level Bathroom Shower/Tub: Tub only Firefighter: Standard Home Adaptive Equipment: None Additional Comments: in the process of moving out of her mobile home Prior Function Level of Independence: Independent Able to Take Stairs?: Yes Driving: Yes Vocation: Unemployed Communication Communication: No difficulties Dominant Hand: Left     Cognition  Overall Cognitive Status: Appears within functional limits for tasks assessed/performed Arousal/Alertness: Awake/alert Orientation Level: Oriented X4 / Intact Behavior During Session: [facility] for tasks performed Cognition - Other Comments: pt is analytical and explains answers at length    Extremity/Trunk Assessment Right Upper Extremity Assessment RUE ROM/Strength/Tone: [facility] for tasks assessed;Deficits;Within functional levels Left Upper Extremity Assessment LUE ROM/Strength/Tone: Deficits LUE ROM/Strength/Tone Deficits: 4/5-4+/5 with mild tremors still present which makes self feeding and drinking more of a challenge Right Lower Extremity Assessment RLE ROM/Strength/Tone: Deficits RLE ROM/Strength/Tone Deficits: 4/5-4+/5 with mild tremors still present which makes self feeding and drinking more of a challenge Left Lower Extremity Assessment LLE ROM/Strength/Tone: WFL for tasks assessed Trunk Assessment Trunk Assessment: Normal     Mobility Bed Mobility Supine to Sit: 7: Independent Sitting - Scoot to Edge of Bed: 7: Independent Details for Bed Mobility  Assistance: no difficulties Transfers Sit to Stand: 7: Independent;Without upper extremity assist;From bed;From toilet Stand to Sit: 7: Independent;Without upper extremity assist;To bed;To chair/3-in-1;To toilet Details for Transfer Assistance: no difficulties, denies dizziness, denies any falls      Balance Balance Balance Assessed: Yes Static  Sitting Balance Static Sitting - Balance Support: No upper extremity supported;Feet supported Static Sitting - Level of Assistance: 7: Independent Dynamic Sitting Balance Dynamic Sitting - Balance Support: Bilateral upper extremity supported;Feet supported Dynamic Sitting - Level of Assistance: 7: Independent Dynamic Sitting Balance - Compensations: EOB during MMT without difficulties or lob Static Standing Balance Static Standing - Balance Support: No upper extremity supported Static Standing - Level of Assistance: 7: Independent Single Leg Stance - Right Leg:  (15s) Single Leg Stance - Left Leg:  (5s) Tandem Stance - Right Leg:  (10s) Tandem Stance - Left Leg:  (10s) Dynamic Standing Balance Dynamic Standing - Balance Support: No upper extremity supported Dynamic Standing - Level of Assistance: 6: Modified independent (Device/Increase time) Dynamic Standing - Balance Activities: Reaching across midline;Reaching for objects;Other (comment) (self care (hand washing, replace pad in underpants)) High Level Balance High Level Balance Activites: Head turns;Other (comment) (change in gait speed) High Level Balance Comments: patient ambulating throughout room and demonstrating her "ballet move" which included standing on one leg (R) and flush toilet with left foot while holding onto grab bar with RUE!   End of Session OT - End of Session Activity Tolerance: Patient tolerated treatment well Patient left: in bed  GO     Ellington Greenslade 02/10/2012, 10:59 AM

## 2012-02-11 NOTE — Care Management Note (Signed)
    Page 1 of 1   02/11/2012     8:22:27 AM   CARE MANAGEMENT NOTE 02/11/2012  Patient:  [name], [name]   Account Number:  [id number]  Date Initiated:  02/10/2012  Documentation initiated by:  [facility]  Subjective/Objective Assessment:   Admitted with tremors     Action/Plan:   PT eval-recommending outpatient PT   Anticipated DC Date:  02/10/2012   Anticipated DC Plan:  HOME/SELF CARE      DC Planning Services  CM consult  Outpatient Services - Pt will follow up      Choice offered to / List presented to:             Status of service:  Completed, signed off Medicare Important Message given?   (If response is "NO", the following Medicare IM given date fields will be blank) Date Medicare IM given:   Date Additional Medicare IM given:    Discharge Disposition:  HOME/SELF CARE  Per UR Regulation:  Reviewed for med. necessity/level of care/duration of stay  If discussed at Long Length of Stay Meetings, dates discussed:    Comments:

## 2012-02-11 NOTE — Progress Notes (Signed)
Late entry for [name], entering G codes:  02-19-12 1057  OT G-codes **NOT FOR INPATIENT CLASS**  Functional Assessment Tool Used Clinical Judgement  Functional Limitation Self care  Self Care Current Status [phone]) [facility]  Self Care Goal Status (X9147) [facility]  Self Care Discharge Status (W2956) [facility]  OT Evaluation  $Initial OT Evaluation Tier I 1 Procedure  OT Treatments  $Self Care/Home Management  8-22 mins

## 2012-12-12 ENCOUNTER — Encounter (HOSPITAL_COMMUNITY): Payer: Self-pay | Admitting: Emergency Medicine

## 2012-12-12 ENCOUNTER — Emergency Department (HOSPITAL_COMMUNITY): Payer: Medicaid Other

## 2012-12-12 ENCOUNTER — Emergency Department (HOSPITAL_COMMUNITY)
Admission: EM | Admit: 2012-12-12 | Discharge: 2012-12-12 | Disposition: A | Discharge status: Home / Self Care | Payer: Medicaid Other | Attending: Emergency Medicine | Admitting: Emergency Medicine

## 2012-12-12 DIAGNOSIS — F3130 Bipolar disorder, current episode depressed, mild or moderate severity, unspecified: Secondary | ICD-10-CM | POA: Insufficient documentation

## 2012-12-12 DIAGNOSIS — Z85828 Personal history of other malignant neoplasm of skin: Secondary | ICD-10-CM | POA: Insufficient documentation

## 2012-12-12 DIAGNOSIS — Z8739 Personal history of other diseases of the musculoskeletal system and connective tissue: Secondary | ICD-10-CM | POA: Insufficient documentation

## 2012-12-12 DIAGNOSIS — Z8669 Personal history of other diseases of the nervous system and sense organs: Secondary | ICD-10-CM | POA: Insufficient documentation

## 2012-12-12 DIAGNOSIS — Z79899 Other long term (current) drug therapy: Secondary | ICD-10-CM | POA: Insufficient documentation

## 2012-12-12 DIAGNOSIS — Z8719 Personal history of other diseases of the digestive system: Secondary | ICD-10-CM | POA: Insufficient documentation

## 2012-12-12 DIAGNOSIS — J45901 Unspecified asthma with (acute) exacerbation: Secondary | ICD-10-CM | POA: Insufficient documentation

## 2012-12-12 DIAGNOSIS — R209 Unspecified disturbances of skin sensation: Secondary | ICD-10-CM | POA: Insufficient documentation

## 2012-12-12 DIAGNOSIS — IMO0002 Reserved for concepts with insufficient information to code with codable children: Secondary | ICD-10-CM | POA: Insufficient documentation

## 2012-12-12 DIAGNOSIS — E039 Hypothyroidism, unspecified: Secondary | ICD-10-CM | POA: Insufficient documentation

## 2012-12-12 DIAGNOSIS — F419 Anxiety disorder, unspecified: Secondary | ICD-10-CM

## 2012-12-12 DIAGNOSIS — F329 Major depressive disorder, single episode, unspecified: Secondary | ICD-10-CM

## 2012-12-12 DIAGNOSIS — F411 Generalized anxiety disorder: Secondary | ICD-10-CM | POA: Insufficient documentation

## 2012-12-12 LAB — COMPREHENSIVE METABOLIC PANEL
BUN: 17 mg/dL (ref 6–23)
Calcium: 10.8 mg/dL — ABNORMAL HIGH (ref 8.4–10.5)
Creatinine, Ser: 0.97 mg/dL (ref 0.50–1.10)
GFR calc Af Amer: 74 mL/min — ABNORMAL LOW (ref >90)
Glucose, Bld: 111 mg/dL — ABNORMAL HIGH (ref 70–99)
Total Protein: 7.7 g/dL (ref 6.0–8.3)

## 2012-12-12 LAB — RAPID URINE DRUG SCREEN, HOSP PERFORMED
Barbiturates: NOT DETECTED
Cocaine: NOT DETECTED
Opiates: NOT DETECTED

## 2012-12-12 LAB — CBC
HCT: 39.8 % (ref 36.0–46.0)
Hemoglobin: 13.7 g/dL (ref 12.0–15.0)
MCH: 30.3 pg (ref 26.0–34.0)
MCHC: 34.4 g/dL (ref 30.0–36.0)

## 2012-12-12 LAB — ETHANOL: Alcohol, Ethyl (B): <11 mg/dL (ref 0–11)

## 2012-12-12 MED ORDER — LITHIUM CARBONATE 300 MG PO CAPS: 300.0000 mg | ORAL_CAPSULE | Freq: Two times a day (BID) | ORAL | Status: DC

## 2012-12-12 MED ORDER — LORAZEPAM 1 MG PO TABS
1.0000 mg | ORAL_TABLET | Freq: Three times a day (TID) | ORAL | Status: DC | PRN
  Administered 2012-12-12: 1 mg via ORAL
  Filled 2012-12-12: qty 1

## 2012-12-12 MED ORDER — CLONAZEPAM 1 MG PO TABS
1.0000 mg | ORAL_TABLET | Freq: Once | ORAL | Status: AC
  Administered 2012-12-12: 1 mg via ORAL
  Filled 2012-12-12: qty 1

## 2012-12-12 MED ORDER — DIVALPROEX SODIUM ER 500 MG PO TB24: 1500.0000 mg | ORAL_TABLET | Freq: Every day | ORAL | Status: DC

## 2012-12-12 MED ORDER — CLONAZEPAM 0.5 MG PO TABS: 0.5000 mg | ORAL_TABLET | Freq: Two times a day (BID) | ORAL | Status: DC | PRN

## 2012-12-12 MED ORDER — LITHIUM CARBONATE 300 MG PO CAPS
300.0000 mg | ORAL_CAPSULE | Freq: Once | ORAL | Status: AC
  Administered 2012-12-12: 300 mg via ORAL
  Filled 2012-12-12: qty 1

## 2012-12-12 MED ORDER — ONDANSETRON HCL 4 MG PO TABS: 4.0000 mg | ORAL_TABLET | Freq: Three times a day (TID) | ORAL | Status: DC | PRN

## 2012-12-12 MED ORDER — SERTRALINE HCL 100 MG PO TABS: 200.0000 mg | ORAL_TABLET | Freq: Every day | ORAL | Status: DC

## 2012-12-12 MED ORDER — DIVALPROEX SODIUM ER 500 MG PO TB24
1500.0000 mg | ORAL_TABLET | Freq: Once | ORAL | Status: AC
  Administered 2012-12-12: 1500 mg via ORAL
  Filled 2012-12-12: qty 3

## 2012-12-12 MED ORDER — SERTRALINE HCL 50 MG PO TABS
200.0000 mg | ORAL_TABLET | Freq: Once | ORAL | Status: AC
  Administered 2012-12-12: 200 mg via ORAL
  Filled 2012-12-12: qty 4

## 2012-12-12 NOTE — Progress Notes (Signed)
[facility] provided pt with a [employer]. Patient stated that she was already in the process of getting the [employer] with [employer] of the [place].

## 2012-12-12 NOTE — ED Notes (Addendum)
Pt presents to ed with c/o severe depression and panic disorder. Pt sts constantly shaking, unable to stop, pt also sts bipolar and OCD and haven't been on her medications since September. Pt is crying and sts will not take her clothes off and will refuse to be wanded. Explained to pt that is process that we have to follow with every behavioral pt, however pt still crying and saying "this is how that bitch   in [facility] was treating me." Pt denies SI/HI
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
## 2012-12-12 NOTE — BHH Counselor (Signed)
TTS consult called in by Misty Stanley at [facility]. Writer then called TTS at [facility] re: consult.  Evette Cristal, [place] Assessment Counselor

## 2012-12-12 NOTE — Progress Notes (Signed)
Writer informed the ER MD (Dr. Veleta Miners) that the consult has been received and the Psychiatrist (Dr. Ladona Ridgel) and the NP [facility]) are in the process of assessing the patient.

## 2012-12-12 NOTE — Consult Note (Signed)
[facility] Face-to-Face Psychiatry Consult   Reason for Consult:  Depression Referring Physician:  EDP  [name] is an 57 y.o. female.  Assessment: AXIS I:  Depressive Disorder NOS AXIS II:  Deferred AXIS III:   Past Medical History  Diagnosis Date  . Bipolar 1 disorder   . Thyroid disease   . 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 Complication of anesthesia   . Asthma   . Hypothyroidism   . Shortness of breath   . Sleep apnea   . GERD (gastroesophageal reflux disease)   . Headache(784.0)   . Neuromuscular disorder 02/08/2012    TREMORS   . Arthritis    AXIS IV:  economic problems, occupational problems, other psychosocial or environmental problems and problems with primary support group AXIS V:  51-60 moderate symptoms  Plan:  No evidence of imminent risk to self or others at present.   Patient does not meet criteria for psychiatric inpatient admission. Supportive therapy provided about ongoing stressors. Discussed crisis plan, support from social network, calling 911, coming to the Emergency Department, and calling Suicide Hotline.  Subjective:   [name] is a 57 y.o. female.  HPI:  Patient states that she is out of her medications related to recent doctor change and not being able to get refills because she lost her bottles of medicine.  Patient denies suicidal/homicidal ideation, psychosis, and paranoia.  Patient states that she just need refills on medication until her next appointment.  Patient states that she also has stress related to having to move to a new mobile home and being unemployed.  Patient states that she was doing well until she came off of he medications.  Patient sees Patton Salles for therapy.   Past Psychiatric History: Past Medical History  Diagnosis Date  . Bipolar 1 disorder   . Thyroid disease   . 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 Complication of anesthesia   . Asthma   . Hypothyroidism   . Shortness of breath   . Sleep apnea    . GERD (gastroesophageal reflux disease)   . Headache(784.0)   . Neuromuscular disorder 02/08/2012    TREMORS   . Arthritis     reports that she has never smoked. She has never used smokeless tobacco. She reports that she does not drink alcohol or use illicit drugs. Family History  Problem Relation Age of Onset  . Coronary artery disease             Allergies:   Allergies  Allergen Reactions  . Aspirin     REACTION: upset stomach  . Bupivacaine Hcl Hives  . Codeine     REACTION: itching  . Etodolac     REACTION: rash  . Hydrocodone-Acetaminophen     REACTION: rash  . Indomethacin Other (See Comments)    Muscle spasms  . Ivp Dye [Iodinated Diagnostic Agents] Other (See Comments)    Flushed and Fever  . Pentazocine Lactate     REACTION: itching  . Propoxyphene-Acetaminophen     REACTION: itching  . Propranolol Nausea Only  . Pseudoephedrine Other (See Comments)    I fly off the walls   . Risperidone And Related     Stomach upset, insomnia, drooling, tremors, "jerks," sensitivity to touch.   . Sulfonamide Derivatives Other (See Comments)    unknown  . Tramadol Hcl Other (See Comments)    unknown    ACT Assessment  Complete:  No:   Past Psychiatric History: Diagnosis:  Depression  Hospitalizations:  Yes  Outpatient Care:  Yes  Substance Abuse Care:  Denies  Self-Mutilation:  Denies  Suicidal Attempts:  Denies  Homicidal Behaviors:  Denies   Violent Behaviors:  Denies   Place of Residence:  [place] Marital Status:  Divorced Employed/Unemployed:  Unemployed Education:   Family Supports:   Objective: Blood pressure 129/76, pulse 81, temperature 97.5 F (36.4 C), temperature source Oral, resp. rate 16, SpO2 100.00%.There is no weight on file to calculate BMI. Results for orders placed during the hospital encounter of 12/12/12 (from the past 72 hour(s))  ACETAMINOPHEN LEVEL     Status: None   Collection Time    12/12/12  1:05 PM      Result Value Range    Acetaminophen (Tylenol), Serum <15.0  10 - 30 ug/mL   Comment:            THERAPEUTIC CONCENTRATIONS VARY     SIGNIFICANTLY. A RANGE OF 10-30     ug/mL MAY BE AN EFFECTIVE     CONCENTRATION FOR MANY PATIENTS.     HOWEVER, SOME ARE BEST TREATED     AT CONCENTRATIONS OUTSIDE THIS     RANGE.     ACETAMINOPHEN CONCENTRATIONS     >150 ug/mL AT 4 HOURS AFTER     INGESTION AND >50 ug/mL AT 12     HOURS AFTER INGESTION ARE     OFTEN ASSOCIATED WITH TOXIC     REACTIONS.  CBC     Status: None   Collection Time    12/12/12  1:05 PM      Result Value Range   WBC 10.3  4.0 - 10.5 K/uL   RBC 4.52  3.87 - 5.11 MIL/uL   Hemoglobin 13.7  12.0 - 15.0 g/dL   HCT 60.4  54.0 - 98.1 %   MCV 88.1  78.0 - 100.0 fL   MCH 30.3  26.0 - 34.0 pg   MCHC 34.4  30.0 - 36.0 g/dL   RDW 19.1  47.8 - 29.5 %   Platelets 284  150 - 400 K/uL  COMPREHENSIVE METABOLIC PANEL     Status: Abnormal   Collection Time    12/12/12  1:05 PM      Result Value Range   Sodium 137  135 - 145 mEq/L   Potassium 3.5  3.5 - 5.1 mEq/L   Chloride 100  96 - 112 mEq/L   CO2 23  19 - 32 mEq/L   Glucose, Bld 111 (*) 70 - 99 mg/dL   BUN 17  6 - 23 mg/dL   Creatinine, Ser 6.21  0.50 - 1.10 mg/dL   Calcium 30.8 (*) 8.4 - 10.5 mg/dL   Total Protein 7.7  6.0 - 8.3 g/dL   Albumin 4.3  3.5 - 5.2 g/dL   AST 23  0 - 37 U/L   ALT 17  0 - 35 U/L   Alkaline Phosphatase 106  39 - 117 U/L   Total Bilirubin 0.5  0.3 - 1.2 mg/dL   GFR calc non Af Amer 64 (*) >90 mL/min   GFR calc Af Amer 74 (*) >90 mL/min   Comment: (NOTE)     The eGFR has been calculated using the CKD EPI equation.     This calculation has not been validated in all clinical situations.     eGFR's persistently <90 mL/min signify possible Chronic Kidney     Disease.  ETHANOL     Status: None   Collection Time    12/12/12  1:05 PM      Result Value Range   Alcohol, Ethyl (B) <11  0 - 11 mg/dL   Comment:            LOWEST DETECTABLE LIMIT FOR     SERUM ALCOHOL IS 11  mg/dL     FOR MEDICAL PURPOSES ONLY  SALICYLATE LEVEL     Status: Abnormal   Collection Time    12/12/12  1:05 PM      Result Value Range   Salicylate Lvl <2.0 (*) 2.8 - 20.0 mg/dL  URINE RAPID DRUG SCREEN (HOSP PERFORMED)     Status: Abnormal   Collection Time    12/12/12  1:21 PM      Result Value Range   Opiates NONE DETECTED  NONE DETECTED   Cocaine NONE DETECTED  NONE DETECTED   Benzodiazepines NONE DETECTED  NONE DETECTED   Amphetamines NONE DETECTED  NONE DETECTED   Tetrahydrocannabinol POSITIVE (*) NONE DETECTED   Barbiturates NONE DETECTED  NONE DETECTED   Comment:            DRUG SCREEN FOR MEDICAL PURPOSES     ONLY.  IF CONFIRMATION IS NEEDED     FOR ANY PURPOSE, NOTIFY LAB     WITHIN 5 DAYS.                LOWEST DETECTABLE LIMITS     FOR URINE DRUG SCREEN     Drug Class       Cutoff (ng/mL)     Amphetamine      1000     Barbiturate      200     Benzodiazepine   200     Tricyclics       300     Opiates          300     Cocaine          300     THC              50     Current Facility-Administered Medications  Medication Dose Route Frequency Provider Last Rate Last Dose  . LORazepam (ATIVAN) tablet 1 mg  1 mg Oral Q8H PRN Roxy Horseman, PA-C   1 mg at 12/12/12 1436  . ondansetron (ZOFRAN) tablet 4 mg  4 mg Oral Q8H PRN Roxy Horseman, PA-C       Current Outpatient Prescriptions  Medication Sig Dispense Refill  . clonazePAM (KLONOPIN) 1 MG tablet Take 1 mg by mouth 3 (three) times daily as needed for anxiety.      . divalproex (DEPAKOTE ER) 500 MG 24 hr tablet Take 1,000 mg by mouth at bedtime.      . fluticasone (FLONASE) 50 MCG/ACT nasal spray Place 1 spray into both nostrils daily.      Marland Kitchen gabapentin (NEURONTIN) 300 MG capsule Take 300 mg by mouth 3 (three) times daily.      . hydrochlorothiazide (MICROZIDE) 12.5 MG capsule Take 12.5 mg by mouth daily.      Marland Kitchen levothyroxine (SYNTHROID, LEVOTHROID) 75 MCG tablet Take 75 mcg by mouth daily before breakfast.       . lithium carbonate 300 MG capsule Take 300 mg by mouth 2 (two) times daily with a meal.      . montelukast (SINGULAIR) 10 MG tablet Take 10 mg by mouth at bedtime.      Marland Kitchen  naproxen sodium (ANAPROX) 220 MG tablet Take 440-880 mg by mouth 2 (two) times daily as needed. Takes 4 prior to taking Maxalt for Migraine      . pantoprazole (PROTONIX) 20 MG tablet Take 20 mg by mouth daily.      . Rizatriptan Benzoate (MAXALT PO) Take 1 tablet by mouth every 8 (eight) hours as needed. For migraine      . sertraline (ZOLOFT) 100 MG tablet Take 200 mg by mouth daily.        Psychiatric Specialty Exam:     Blood pressure 129/76, pulse 81, temperature 97.5 F (36.4 C), temperature source Oral, resp. rate 16, SpO2 100.00%.There is no weight on file to calculate BMI.  General Appearance: Bizarre  Eye Contact::  Good  Speech:  Clear and Coherent and Normal Rate  Volume:  Normal  Mood:  Anxious and Depressed  Affect:  Depressed  Thought Process:  Circumstantial and Goal Directed  Orientation:  Full (Time, Place, and Person)  Thought Content:  Rumination  Suicidal Thoughts:  No  Homicidal Thoughts:  No  Memory:  Immediate;   Good Recent;   Good Remote;   Good  Judgement:  Fair  Insight:  Present  Psychomotor Activity:  Normal  Concentration:  Good  Recall:  Good  Akathisia:  No  Handed:  Right  AIMS (if indicated):     Assets:  Communication Skills Desire for Improvement  Sleep:      Face to face interview and consult with Dr. Ladona Ridgel  Treatment Plan Summary: Follow up with primary psych  Disposition:  Discharge home to follow up with primary psychiatrist for medication management.  Give 2 week psychotropic medication refill.   Assunta Found, FNP-BC 12/12/2012 3:57 PM

## 2012-12-13 NOTE — Consult Note (Signed)
Note reviewed and agreed with  

## 2012-12-15 NOTE — ED Provider Notes (Signed)
Medical screening examination/treatment/procedure(s) were performed by non-physician practitioner and as supervising physician I was immediately available for consultation/collaboration.  EKG Interpretation   None         Audree Camel, MD 12/15/12 [phone]

## 2013-01-08 ENCOUNTER — Other Ambulatory Visit (HOSPITAL_COMMUNITY): Payer: Self-pay | Admitting: *Deleted

## 2013-01-08 DIAGNOSIS — R2232 Localized swelling, mass and lump, left upper limb: Secondary | ICD-10-CM

## 2013-01-09 ENCOUNTER — Ambulatory Visit (HOSPITAL_COMMUNITY)
Admission: RE | Admit: 2013-01-09 | Discharge: 2013-01-09 | Disposition: A | Discharge status: Home / Self Care | Payer: Medicaid Other | Source: Ambulatory Visit | Attending: Obstetrics and Gynecology | Admitting: Obstetrics and Gynecology

## 2013-01-09 ENCOUNTER — Encounter (HOSPITAL_COMMUNITY): Payer: Self-pay

## 2013-01-09 VITALS — BP 128/80 | Temp 98.7°F | Ht 60.0 in | Wt 132.8 lb

## 2013-01-09 DIAGNOSIS — Z1239 Encounter for other screening for malignant neoplasm of breast: Secondary | ICD-10-CM

## 2013-01-09 NOTE — Patient Instructions (Signed)
Taught [name] how to perform BSE and gave educational materials to take home. Patient did not need a Pap smear today due to last Pap smear was October 12, 2012. Let her know [facility]CP will cover Pap smears every 3 years unless has a history of abnormal Pap smears. Referred patient to the [facility] for diagnostic mammogram and possible left breast ultrasound. Appointment scheduled for Wednesday, January 17, 2013 at 1315. Patient aware of appointment and will be there. [name] verbalized understanding.  [place], [name], RN 8:27 AM

## 2013-01-09 NOTE — Progress Notes (Signed)
Complaints of left axillary tenderness and lumps x 3 months. Patient states pain is constant and increases when touched. Patient rated pain at a 6-7 out of 10.  Pap Smear:    Pap smear not completed today. Last Pap smear was 10/12/2012 at [facility] and normal. Per patient has no history of an abnormal Pap smear.  Pap smear result to be scanned in EPIC under media.  Physical exam: Breasts Right breast slightly larger than left breast. No skin abnormalities bilateral breasts. No nipple retraction bilateral breasts. No nipple discharge bilateral breasts. No lymphadenopathy. No lumps palpated right breast. Palpated a tiny lump within the left axilla. Complaints of left axillary, left outer lower breast and left center breast tenderness on exam. Referred patient to the [facility] for diagnostic mammogram and possible left breast ultrasound. Appointment scheduled for Wednesday, January 17, 2013 at 1315.       Pelvic/Bimanual No Pap smear completed today since last Pap smear was 10/12/2012. Pap smear not indicated per BCCCP guidelines.

## 2013-01-17 ENCOUNTER — Ambulatory Visit
Admission: RE | Admit: 2013-01-17 | Discharge: 2013-01-17 | Disposition: A | Discharge status: Home / Self Care | Payer: No Typology Code available for payment source | Source: Ambulatory Visit | Attending: Obstetrics and Gynecology | Admitting: Obstetrics and Gynecology

## 2013-01-17 DIAGNOSIS — R2232 Localized swelling, mass and lump, left upper limb: Secondary | ICD-10-CM

## 2013-05-30 DIAGNOSIS — R350 Frequency of micturition: Secondary | ICD-10-CM | POA: Insufficient documentation

## 2013-06-25 ENCOUNTER — Emergency Department (HOSPITAL_COMMUNITY)
Admission: EM | Admit: 2013-06-25 | Discharge: 2013-06-25 | Disposition: A | Discharge status: Home / Self Care | Payer: Medicaid Other | Attending: Emergency Medicine | Admitting: Emergency Medicine

## 2013-06-25 ENCOUNTER — Encounter (HOSPITAL_COMMUNITY): Payer: Self-pay | Admitting: Emergency Medicine

## 2013-06-25 DIAGNOSIS — W5503XA Scratched by cat, initial encounter: Secondary | ICD-10-CM

## 2013-06-25 DIAGNOSIS — S0180XA Unspecified open wound of other part of head, initial encounter: Secondary | ICD-10-CM | POA: Insufficient documentation

## 2013-06-25 DIAGNOSIS — E039 Hypothyroidism, unspecified: Secondary | ICD-10-CM | POA: Insufficient documentation

## 2013-06-25 DIAGNOSIS — Z23 Encounter for immunization: Secondary | ICD-10-CM | POA: Insufficient documentation

## 2013-06-25 DIAGNOSIS — T07XXXA Unspecified multiple injuries, initial encounter: Secondary | ICD-10-CM

## 2013-06-25 DIAGNOSIS — Z79899 Other long term (current) drug therapy: Secondary | ICD-10-CM | POA: Insufficient documentation

## 2013-06-25 DIAGNOSIS — S0081XA Abrasion of other part of head, initial encounter: Secondary | ICD-10-CM

## 2013-06-25 DIAGNOSIS — J45909 Unspecified asthma, uncomplicated: Secondary | ICD-10-CM | POA: Insufficient documentation

## 2013-06-25 DIAGNOSIS — Z85828 Personal history of other malignant neoplasm of skin: Secondary | ICD-10-CM | POA: Insufficient documentation

## 2013-06-25 DIAGNOSIS — F319 Bipolar disorder, unspecified: Secondary | ICD-10-CM | POA: Insufficient documentation

## 2013-06-25 DIAGNOSIS — S058X9A Other injuries of unspecified eye and orbit, initial encounter: Secondary | ICD-10-CM | POA: Insufficient documentation

## 2013-06-25 DIAGNOSIS — Y929 Unspecified place or not applicable: Secondary | ICD-10-CM | POA: Insufficient documentation

## 2013-06-25 DIAGNOSIS — Y939 Activity, unspecified: Secondary | ICD-10-CM | POA: Insufficient documentation

## 2013-06-25 DIAGNOSIS — Z8669 Personal history of other diseases of the nervous system and sense organs: Secondary | ICD-10-CM | POA: Insufficient documentation

## 2013-06-25 DIAGNOSIS — K219 Gastro-esophageal reflux disease without esophagitis: Secondary | ICD-10-CM | POA: Insufficient documentation

## 2013-06-25 DIAGNOSIS — W64XXXA Exposure to other animate mechanical forces, initial encounter: Secondary | ICD-10-CM | POA: Insufficient documentation

## 2013-06-25 DIAGNOSIS — S61209A Unspecified open wound of unspecified finger without damage to nail, initial encounter: Secondary | ICD-10-CM | POA: Insufficient documentation

## 2013-06-25 DIAGNOSIS — M129 Arthropathy, unspecified: Secondary | ICD-10-CM | POA: Insufficient documentation

## 2013-06-25 DIAGNOSIS — IMO0002 Reserved for concepts with insufficient information to code with codable children: Secondary | ICD-10-CM | POA: Insufficient documentation

## 2013-06-25 MED ORDER — BACITRACIN 500 UNIT/GM EX OINT
1.0000 "application " | TOPICAL_OINTMENT | Freq: Two times a day (BID) | CUTANEOUS | Status: DC
  Administered 2013-06-25: 1 via TOPICAL
  Filled 2013-06-25 (×2): qty 0.9

## 2013-06-25 MED ORDER — TETANUS-DIPHTH-ACELL PERTUSSIS 5-2.5-18.5 LF-MCG/0.5 IM SUSP
0.5000 mL | Freq: Once | INTRAMUSCULAR | Status: AC
  Administered 2013-06-25: 0.5 mL via INTRAMUSCULAR
  Filled 2013-06-25: qty 0.5

## 2013-06-25 NOTE — ED Provider Notes (Signed)
Medical screening examination/treatment/procedure(s) were performed by non-physician practitioner and as supervising physician I was immediately available for consultation/collaboration.     Veryl Speak, MD 06/25/13 [phone]

## 2013-06-25 NOTE — ED Provider Notes (Signed)
CSN: [id number]     Arrival date & time 06/25/13  1300 History  This chart was scribed for non-physician practitioner, Debroah Baller, [place] working with Veryl Speak, MD by Frederich Balding, ED scribe. This patient was seen in room TR10C/TR10C and the patient's care was started at 1:55 PM.   Chief Complaint  Patient presents with  . Facial Pain   The history is provided by the patient. No language interpreter was used.   HPI Comments: CASIMIRA SUTPHIN is a 58 y.o. female who presents to the Emergency Department complaining of facial injury that occurred earlier this morning. She states her cat scratched her on the left side of her face by her eyelid and above her eye. States the one above her eye has formed a knot around it. The cat is up to date on its vaccinations. She washed the wounds with soap and water. Pt is unsure of when her last tetanus was. She denies any other problems today.  Past Medical History  Diagnosis Date  . Bipolar 1 disorder   . Thyroid disease   . 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 Complication of anesthesia   . Asthma   . Hypothyroidism   . Shortness of breath   . Sleep apnea   . GERD (gastroesophageal reflux disease)   . Headache(784.0)   . Neuromuscular disorder 02/08/2012    TREMORS   . Arthritis    Past Surgical History  Procedure Laterality Date  . Cholecystectomy    . Tonsillectomy    . Cystoscopy    . Knee surgery    . Carpal tunnel release     Family History  Problem Relation Age of Onset  . Coronary artery disease    . Hypertension Mother   . Thyroid disease Mother   . Cancer Father    History  Substance Use Topics  . Smoking status: Never Smoker   . Smokeless tobacco: Never Used  . Alcohol Use: Yes     Comment: rarely   OB History   Grav Para Term Preterm Abortions TAB SAB Ect Mult Living   0              Review of Systems  Skin: Positive for wound.  All other systems reviewed and are negative.  Allergies  Aspirin; Bupivacaine  hcl; Codeine; Etodolac; Hydrocodone-acetaminophen; Indomethacin; Ivp dye; Pentazocine lactate; Propoxyphene n-acetaminophen; Propranolol; Pseudoephedrine; Risperidone and related; Sulfonamide derivatives; and Tramadol hcl  Home Medications   Prior to Admission medications   Medication Sig Start Date End Date Taking? Authorizing Provider  clonazePAM (KLONOPIN) 0.5 MG tablet Take 1 tablet (0.5 mg total) by mouth 2 (two) times daily as needed for anxiety. 12/12/12   Shuvon Rankin, NP  divalproex (DEPAKOTE ER) 500 MG 24 hr tablet Take 3 tablets (1,500 mg total) by mouth at bedtime. On Sundays, take 1 pill (500 mg) at night.  Rest of the week, take 1500 mg (3 pills) at night. 12/12/12   Shuvon Rankin, NP  fluticasone (FLONASE) 50 MCG/ACT nasal spray Place 1 spray into both nostrils daily.    Historical Provider, MD  gabapentin (NEURONTIN) 300 MG capsule Take 300 mg by mouth 3 (three) times daily.    Historical Provider, MD  hydrochlorothiazide (MICROZIDE) 12.5 MG capsule Take 12.5 mg by mouth daily.    Historical Provider, MD  levothyroxine (SYNTHROID, LEVOTHROID) 75 MCG tablet Take 75 mcg by mouth daily before breakfast.    Historical Provider, MD  lithium  carbonate 300 MG capsule Take 1 capsule (300 mg total) by mouth 2 (two) times daily with a meal. 12/12/12   Shuvon Rankin, NP  montelukast (SINGULAIR) 10 MG tablet Take 10 mg by mouth at bedtime.    Historical Provider, MD  naproxen sodium (ANAPROX) 220 MG tablet Take 440-880 mg by mouth 2 (two) times daily as needed. Takes 4 prior to taking Maxalt for Migraine    Historical Provider, MD  pantoprazole (PROTONIX) 20 MG tablet Take 20 mg by mouth daily.    Historical Provider, MD  Rizatriptan Benzoate (MAXALT PO) Take 1 tablet by mouth every 8 (eight) hours as needed. For migraine    Historical Provider, MD  sertraline (ZOLOFT) 100 MG tablet Take 2 tablets (200 mg total) by mouth daily. 12/12/12   Shuvon Rankin, NP   BP 122/73  Pulse 79  Temp(Src) 98.2  F (36.8 C) (Oral)  Resp 18  SpO2 94%  Physical Exam  Nursing note and vitals reviewed. Constitutional: She is oriented to person, place, and time. She appears well-developed and well-nourished. No distress.  HENT:  Head: Normocephalic and atraumatic.  Eyes: EOM are normal.  Neck: Neck supple. No tracheal deviation present.  Cardiovascular: Normal rate.   Pulmonary/Chest: Effort normal. No respiratory distress.  Musculoskeletal: Normal range of motion.  Neurological: She is alert and oriented to person, place, and time.  Skin: Skin is warm and dry.  Puncture wound to left thumb. Puncture wounds to the base of the dorsal aspect of left ring finger. Ecchymosis around puncture sights. Puncture wounds to upper left eyelid and the left forehead.   Psychiatric: She has a normal mood and affect. Her behavior is normal.    ED Course  Procedures (including critical care time)  DIAGNOSTIC STUDIES: Oxygen Saturation is 94% on RA, adequate by my interpretation.    COORDINATION OF CARE: 1:57 PM-Discussed treatment plan which includes an antibiotic with pt at bedside and pt agreed to plan.  2:29 PM-Spoke with Dr. Stark Jock. Will update pt's tetanus, put antibiotic ointment over puncture wounds and discharge home with return precautions.    MDM  58 y.o. female with puncture wound to the left hand and face s/p her cat scratching early this am. Stable for discharge without fever or signs of infection. She will return for any problems.   I personally performed the services described in this documentation, which was scribed in my presence. The recorded information has been reviewed and is accurate.  Ashley [place], [place] 06/25/13 [phone]

## 2013-06-25 NOTE — ED Notes (Signed)
Was scratched by her own cat this am on her face left side of face by eye small amt abrasion noted cats are UTD on shots but the pt does not remember when her lasttetanus shot was

## 2013-06-27 NOTE — Discharge Planning (Signed)
[facility] Liaison was not able to see the patient, [facility] orange card information and resources will be mailed to the address listed. ° °

## 2013-09-14 ENCOUNTER — Encounter (HOSPITAL_COMMUNITY): Payer: Self-pay | Admitting: Emergency Medicine

## 2013-09-14 ENCOUNTER — Emergency Department (HOSPITAL_COMMUNITY)
Admission: EM | Admit: 2013-09-14 | Discharge: 2013-09-14 | Disposition: A | Discharge status: Home / Self Care | Payer: Medicaid Other | Attending: Emergency Medicine | Admitting: Emergency Medicine

## 2013-09-14 DIAGNOSIS — R1031 Right lower quadrant pain: Secondary | ICD-10-CM | POA: Diagnosis not present

## 2013-09-14 DIAGNOSIS — IMO0002 Reserved for concepts with insufficient information to code with codable children: Secondary | ICD-10-CM | POA: Diagnosis not present

## 2013-09-14 DIAGNOSIS — R197 Diarrhea, unspecified: Secondary | ICD-10-CM | POA: Insufficient documentation

## 2013-09-14 DIAGNOSIS — R5381 Other malaise: Secondary | ICD-10-CM | POA: Diagnosis not present

## 2013-09-14 DIAGNOSIS — R51 Headache: Secondary | ICD-10-CM | POA: Diagnosis not present

## 2013-09-14 DIAGNOSIS — F319 Bipolar disorder, unspecified: Secondary | ICD-10-CM | POA: Diagnosis not present

## 2013-09-14 DIAGNOSIS — K219 Gastro-esophageal reflux disease without esophagitis: Secondary | ICD-10-CM | POA: Insufficient documentation

## 2013-09-14 DIAGNOSIS — M129 Arthropathy, unspecified: Secondary | ICD-10-CM | POA: Diagnosis not present

## 2013-09-14 DIAGNOSIS — J45909 Unspecified asthma, uncomplicated: Secondary | ICD-10-CM | POA: Diagnosis not present

## 2013-09-14 DIAGNOSIS — E876 Hypokalemia: Secondary | ICD-10-CM | POA: Insufficient documentation

## 2013-09-14 DIAGNOSIS — Z79899 Other long term (current) drug therapy: Secondary | ICD-10-CM | POA: Diagnosis not present

## 2013-09-14 DIAGNOSIS — Z791 Long term (current) use of non-steroidal anti-inflammatories (NSAID): Secondary | ICD-10-CM | POA: Diagnosis not present

## 2013-09-14 DIAGNOSIS — R5383 Other fatigue: Secondary | ICD-10-CM | POA: Diagnosis not present

## 2013-09-14 DIAGNOSIS — R110 Nausea: Secondary | ICD-10-CM | POA: Insufficient documentation

## 2013-09-14 DIAGNOSIS — Z8639 Personal history of other endocrine, nutritional and metabolic disease: Secondary | ICD-10-CM | POA: Insufficient documentation

## 2013-09-14 DIAGNOSIS — Z862 Personal history of diseases of the blood and blood-forming organs and certain disorders involving the immune mechanism: Secondary | ICD-10-CM | POA: Insufficient documentation

## 2013-09-14 LAB — URINALYSIS, ROUTINE W REFLEX MICROSCOPIC
Bilirubin Urine: NEGATIVE
GLUCOSE, UA: NEGATIVE mg/dL
HGB URINE DIPSTICK: NEGATIVE
KETONES UR: NEGATIVE mg/dL
Leukocytes, UA: NEGATIVE
Nitrite: NEGATIVE
Protein, ur: NEGATIVE mg/dL
Specific Gravity, Urine: 1.008 (ref 1.005–1.030)
UROBILINOGEN UA: 1 mg/dL (ref 0.0–1.0)
pH: 7 (ref 5.0–8.0)

## 2013-09-14 LAB — CBC WITH DIFFERENTIAL/PLATELET
BASOS ABS: 0 10*3/uL (ref 0.0–0.1)
Basophils Relative: 0 % (ref 0–1)
EOS PCT: 1 % (ref 0–5)
Eosinophils Absolute: 0.1 10*3/uL (ref 0.0–0.7)
HEMATOCRIT: 38.5 % (ref 36.0–46.0)
HEMOGLOBIN: 13.3 g/dL (ref 12.0–15.0)
LYMPHS ABS: 2.7 10*3/uL (ref 0.7–4.0)
Lymphocytes Relative: 32 % (ref 12–46)
MCH: 31.1 pg (ref 26.0–34.0)
MCHC: 34.5 g/dL (ref 30.0–36.0)
MCV: 90 fL (ref 78.0–100.0)
MONO ABS: 0.8 10*3/uL (ref 0.1–1.0)
Monocytes Relative: 9 % (ref 3–12)
NEUTROS ABS: 4.9 10*3/uL (ref 1.7–7.7)
Neutrophils Relative %: 58 % (ref 43–77)
Platelets: 218 10*3/uL (ref 150–400)
RBC: 4.28 MIL/uL (ref 3.87–5.11)
RDW: 12.7 % (ref 11.5–15.5)
WBC: 8.4 10*3/uL (ref 4.0–10.5)

## 2013-09-14 LAB — COMPREHENSIVE METABOLIC PANEL
ALT: 18 U/L (ref 0–35)
AST: 20 U/L (ref 0–37)
Albumin: 4.2 g/dL (ref 3.5–5.2)
Alkaline Phosphatase: 111 U/L (ref 39–117)
Anion gap: 18 — ABNORMAL HIGH (ref 5–15)
BILIRUBIN TOTAL: 0.5 mg/dL (ref 0.3–1.2)
BUN: 8 mg/dL (ref 6–23)
CALCIUM: 9.6 mg/dL (ref 8.4–10.5)
CO2: 21 mEq/L (ref 19–32)
CREATININE: 0.82 mg/dL (ref 0.50–1.10)
Chloride: 107 mEq/L (ref 96–112)
GFR calc non Af Amer: 79 mL/min — ABNORMAL LOW (ref >90)
GLUCOSE: 102 mg/dL — AB (ref 70–99)
Potassium: 3.1 mEq/L — ABNORMAL LOW (ref 3.7–5.3)
SODIUM: 146 meq/L (ref 137–147)
Total Protein: 7.2 g/dL (ref 6.0–8.3)

## 2013-09-14 LAB — LIPASE, BLOOD: Lipase: 49 U/L (ref 11–59)

## 2013-09-14 MED ORDER — POTASSIUM CHLORIDE CRYS ER 20 MEQ PO TBCR
40.0000 meq | EXTENDED_RELEASE_TABLET | Freq: Once | ORAL | Status: AC
  Administered 2013-09-14: 40 meq via ORAL
  Filled 2013-09-14: qty 2

## 2013-09-14 MED ORDER — ONDANSETRON HCL 4 MG/2ML IJ SOLN
4.0000 mg | Freq: Once | INTRAMUSCULAR | Status: AC
  Administered 2013-09-14: 4 mg via INTRAVENOUS
  Filled 2013-09-14: qty 2

## 2013-09-14 MED ORDER — SODIUM CHLORIDE 0.9 % IV BOLUS (SEPSIS)
1000.0000 mL | Freq: Once | INTRAVENOUS | Status: AC
  Administered 2013-09-14: 1000 mL via INTRAVENOUS

## 2013-09-14 MED ORDER — ONDANSETRON HCL 4 MG PO TABS: 4.0000 mg | ORAL_TABLET | Freq: Four times a day (QID) | ORAL | Status: DC

## 2013-09-14 NOTE — ED Provider Notes (Signed)
CSN: [id number]     Arrival date & time 09/14/13  1158 History   First MD Initiated Contact with Patient 09/14/13 1205     Chief Complaint  Patient presents with  . Diarrhea     (Consider location/radiation/quality/duration/timing/severity/associated sxs/prior Treatment) HPI Comments: The patient is a 58 year old female past no history of bipolar disorder, headaches, tremors presents emergency room in chief complaint of decrease in appetite and overall not feeling well for approximately one week. The patient reports she is unable to eat do to lack of appetite and diarrhea. She reports she has been several crackers her last week with loose stool after. She reports multiple episodes of loose stool. Denies vomiting. She reports generalized abdominal cramping. Patient reports onset of discomfort, and decreased appetite after recent death of mother.  She reports she was unable to take medications for the past week. Past abdominal surgeries include cholecystectomy, endometrial ablation.LNMP several years ago.  The history is provided by the patient. No language interpreter was used.    Past Medical History  Diagnosis Date  . Bipolar 1 disorder   . Thyroid disease   . 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 Complication of anesthesia   . Asthma   . Hypothyroidism   . Shortness of breath   . Sleep apnea   . GERD (gastroesophageal reflux disease)   . Headache(784.0)   . Neuromuscular disorder 02/08/2012    TREMORS   . Arthritis    Past Surgical History  Procedure Laterality Date  . Cholecystectomy    . Tonsillectomy    . Cystoscopy    . Knee surgery    . Carpal tunnel release     Family History  Problem Relation Age of Onset  . Coronary artery disease    . Hypertension Mother   . Thyroid disease Mother   . Cancer Father    History  Substance Use Topics  . Smoking status: Never Smoker   . Smokeless tobacco: Never Used  . Alcohol Use: Yes     Comment: rarely   OB History   Grav Para Term Preterm Abortions TAB SAB Ect Mult Living   0              Review of Systems  Constitutional: Positive for appetite change and fatigue. Negative for fever.  Cardiovascular: Negative for leg swelling.  Gastrointestinal: Positive for nausea, abdominal pain and diarrhea. Negative for vomiting.  Neurological: Negative for light-headedness.      Allergies  Aspirin; Bupivacaine hcl; Codeine; Etodolac; Hydrocodone-acetaminophen; Indomethacin; Ivp dye; Pentazocine lactate; Propoxyphene n-acetaminophen; Propranolol; Pseudoephedrine; Risperidone and related; Sulfonamide derivatives; and Tramadol hcl  Home Medications   Prior to Admission medications   Medication Sig Start Date End Date Taking? Authorizing Provider  albuterol (PROVENTIL HFA;VENTOLIN HFA) 108 (90 BASE) MCG/ACT inhaler Inhale 1-2 puffs into the lungs daily as needed for wheezing or shortness of breath.    Historical Provider, MD  beclomethasone (QVAR) 80 MCG/ACT inhaler Inhale 2 puffs into the lungs 2 (two) times daily.    Historical Provider, MD  cetirizine (ZYRTEC) 10 MG tablet Take 10 mg by mouth daily.    Historical Provider, MD  divalproex (DEPAKOTE ER) 500 MG 24 hr tablet Take 3 tablets (1,500 mg total) by mouth at bedtime. On Sundays, take 1 pill (500 mg) at night.  Rest of the week, take 1500 mg (3 pills) at night. 12/12/12   Shuvon Rankin, NP  fluticasone (FLONASE) 50 MCG/ACT nasal spray Place 1  spray into both nostrils daily.    Historical Provider, MD  gabapentin (NEURONTIN) 300 MG capsule Take 300 mg by mouth 3 (three) times daily.    Historical Provider, MD  hydrochlorothiazide (MICROZIDE) 12.5 MG capsule Take 12.5 mg by mouth daily.    Historical Provider, MD  hydrOXYzine (ATARAX/VISTARIL) 25 MG tablet Take 25 mg by mouth at bedtime.    Historical Provider, MD  lithium carbonate 300 MG capsule Take 300 mg by mouth daily. 12/12/12   Shuvon Rankin, NP  Naproxen Sodium 220 MG CAPS Take 440-880 mg by mouth  daily.    Historical Provider, MD  pantoprazole (PROTONIX) 20 MG tablet Take 20 mg by mouth daily.    Historical Provider, MD  rizatriptan (MAXALT-MLT) 5 MG disintegrating tablet Take 5 mg by mouth daily as needed for migraine. May repeat in 2 hours if needed    Historical Provider, MD  sertraline (ZOLOFT) 100 MG tablet Take 2 tablets (200 mg total) by mouth daily. 12/12/12   Shuvon Rankin, NP   BP 146/86  Pulse 84  Temp(Src) 97.5 F (36.4 C) (Oral)  Resp 20  SpO2 100% Physical Exam  Nursing note and vitals reviewed. Constitutional: She is oriented to person, place, and time. She appears well-developed and well-nourished. No distress.  HENT:  Head: Normocephalic and atraumatic.  Eyes: EOM are normal.  Neck: Neck supple.  Cardiovascular: Normal rate and regular rhythm.   No lower extremity edema  Pulmonary/Chest: Effort normal and breath sounds normal. No respiratory distress. She has no wheezes. She has no rales.  Abdominal: Soft. Bowel sounds are normal. There is tenderness in the right lower quadrant. There is guarding. There is no rebound and no CVA tenderness.    Minimal abdominal tenderness with voluntary guarding.  Musculoskeletal: Normal range of motion.  Neurological: She is alert and oriented to person, place, and time. She displays tremor.  Skin: Skin is warm and dry. She is not diaphoretic.  Psychiatric: She has a normal mood and affect. Her behavior is normal.    ED Course  Procedures (including critical care time) Labs Review Results for orders placed during the hospital encounter of 09/14/13  CBC WITH DIFFERENTIAL      Result Value Ref Range   WBC 8.4  4.0 - 10.5 K/uL   RBC 4.28  3.87 - 5.11 MIL/uL   Hemoglobin 13.3  12.0 - 15.0 g/dL   HCT 38.5  36.0 - 46.0 %   MCV 90.0  78.0 - 100.0 fL   MCH 31.1  26.0 - 34.0 pg   MCHC 34.5  30.0 - 36.0 g/dL   RDW 12.7  11.5 - 15.5 %   Platelets 218  150 - 400 K/uL   Neutrophils Relative % 58  43 - 77 %   Neutro Abs 4.9   1.7 - 7.7 K/uL   Lymphocytes Relative 32  12 - 46 %   Lymphs Abs 2.7  0.7 - 4.0 K/uL   Monocytes Relative 9  3 - 12 %   Monocytes Absolute 0.8  0.1 - 1.0 K/uL   Eosinophils Relative 1  0 - 5 %   Eosinophils Absolute 0.1  0.0 - 0.7 K/uL   Basophils Relative 0  0 - 1 %   Basophils Absolute 0.0  0.0 - 0.1 K/uL  COMPREHENSIVE METABOLIC PANEL      Result Value Ref Range   Sodium 146  137 - 147 mEq/L   Potassium 3.1 (*) 3.7 - 5.3 mEq/L   Chloride  107  96 - 112 mEq/L   CO2 21  19 - 32 mEq/L   Glucose, Bld 102 (*) 70 - 99 mg/dL   BUN 8  6 - 23 mg/dL   Creatinine, Ser 0.82  0.50 - 1.10 mg/dL   Calcium 9.6  8.4 - 10.5 mg/dL   Total Protein 7.2  6.0 - 8.3 g/dL   Albumin 4.2  3.5 - 5.2 g/dL   AST 20  0 - 37 U/L   ALT 18  0 - 35 U/L   Alkaline Phosphatase 111  39 - 117 U/L   Total Bilirubin 0.5  0.3 - 1.2 mg/dL   GFR calc non Af Amer 79 (*) >90 mL/min   GFR calc Af Amer >90  >90 mL/min   Anion gap 18 (*) 5 - 15  URINALYSIS, ROUTINE W REFLEX MICROSCOPIC      Result Value Ref Range   Color, Urine YELLOW  YELLOW   APPearance CLEAR  CLEAR   Specific Gravity, Urine 1.008  1.005 - 1.030   pH 7.0  5.0 - 8.0   Glucose, UA NEGATIVE  NEGATIVE mg/dL   Hgb urine dipstick NEGATIVE  NEGATIVE   Bilirubin Urine NEGATIVE  NEGATIVE   Ketones, ur NEGATIVE  NEGATIVE mg/dL   Protein, ur NEGATIVE  NEGATIVE mg/dL   Urobilinogen, UA 1.0  0.0 - 1.0 mg/dL   Nitrite NEGATIVE  NEGATIVE   Leukocytes, UA NEGATIVE  NEGATIVE  LIPASE, BLOOD      Result Value Ref Range   Lipase 49  11 - 59 U/L    Imaging Review No results found.   EKG Interpretation None      MDM   Final diagnoses:  Diarrhea  Hypokalemia   Patient presents with diarrhea after several days of decreased appetite, likely do to recent death of her mother. Labs ordered, minimal abdominal pain with palpation. Potassium 3.1 likely due to diarrhea and poor oral intake. We will replete orally. Discussed lab results, imaging results, and  treatment plan with the patient. Advised followup with the PCP for repeat potassium. Return precautions given. Reports understanding and no other concerns at this time.  Patient is stable for discharge at this time.  Meds given in ED:  Medications  sodium chloride 0.9 % bolus 1,000 mL (0 mLs Intravenous Stopped 09/14/13 1434)  ondansetron (ZOFRAN) injection 4 mg (4 mg Intravenous Given 09/14/13 1309)  potassium chloride SA (K-DUR,KLOR-CON) CR tablet 40 mEq (40 mEq Oral Given 09/14/13 1344)    New Prescriptions   ONDANSETRON (ZOFRAN) 4 MG TABLET    Take 1 tablet (4 mg total) by mouth every 6 (six) hours.        Harvie Heck, PA-C 09/14/13 [place], PA-C 09/14/13 1451

## 2013-09-14 NOTE — ED Notes (Signed)
Pt in stating she has not been eating for the last week and has been feeling bad, reports diarrhea during this time and nausea, here due to being concerned for being dehydrated

## 2013-09-14 NOTE — Discharge Instructions (Signed)
Call for a follow up appointment with a Family or Primary Care Provider for repeat blood work to check your potassium. Return if Symptoms worsen.   Take medication as prescribed.  Drink plenty of fluids. Continue a clear liquid diet, advance to a BRAT diet (bananas, rice, applesauce, toast) as tolerated.

## 2013-09-14 NOTE — ED Notes (Signed)
Pt ambulated to restroom with no issue. 

## 2013-09-14 NOTE — ED Notes (Signed)
EDP at bedside  

## 2013-09-15 NOTE — ED Provider Notes (Signed)
Medical screening examination/treatment/procedure(s) were conducted as a shared visit with non-physician practitioner(s) and myself.  I personally evaluated the patient during the encounter.   EKG Interpretation None      Diarrhea.  Mild generalized weakness.  Hydrated in the emergency department.  Stable vital signs.  Labs without significant abnormality except for mild hypokalemia.  Discharge home with PCP followup  Hoy Morn, MD 09/15/13 [phone]

## 2013-12-11 ENCOUNTER — Ambulatory Visit (HOSPITAL_BASED_OUTPATIENT_CLINIC_OR_DEPARTMENT_OTHER): Payer: Medicaid Other

## 2013-12-11 ENCOUNTER — Encounter: Payer: Self-pay | Admitting: Internal Medicine

## 2013-12-11 ENCOUNTER — Ambulatory Visit: Payer: Medicaid Other | Attending: Internal Medicine | Admitting: Internal Medicine

## 2013-12-11 VITALS — BP 125/78 | HR 67 | Temp 98.6°F | Resp 16 | Wt 142.0 lb

## 2013-12-11 DIAGNOSIS — G709 Myoneural disorder, unspecified: Secondary | ICD-10-CM | POA: Diagnosis not present

## 2013-12-11 DIAGNOSIS — I10 Essential (primary) hypertension: Secondary | ICD-10-CM

## 2013-12-11 DIAGNOSIS — K219 Gastro-esophageal reflux disease without esophagitis: Secondary | ICD-10-CM | POA: Diagnosis not present

## 2013-12-11 DIAGNOSIS — F319 Bipolar disorder, unspecified: Secondary | ICD-10-CM | POA: Diagnosis not present

## 2013-12-11 DIAGNOSIS — E039 Hypothyroidism, unspecified: Secondary | ICD-10-CM | POA: Insufficient documentation

## 2013-12-11 DIAGNOSIS — Z8639 Personal history of other endocrine, nutritional and metabolic disease: Secondary | ICD-10-CM

## 2013-12-11 DIAGNOSIS — R51 Headache: Secondary | ICD-10-CM | POA: Diagnosis not present

## 2013-12-11 DIAGNOSIS — M179 Osteoarthritis of knee, unspecified: Secondary | ICD-10-CM

## 2013-12-11 DIAGNOSIS — Z791 Long term (current) use of non-steroidal anti-inflammatories (NSAID): Secondary | ICD-10-CM | POA: Insufficient documentation

## 2013-12-11 DIAGNOSIS — Z23 Encounter for immunization: Secondary | ICD-10-CM | POA: Diagnosis not present

## 2013-12-11 DIAGNOSIS — Z1211 Encounter for screening for malignant neoplasm of colon: Secondary | ICD-10-CM

## 2013-12-11 DIAGNOSIS — R251 Tremor, unspecified: Secondary | ICD-10-CM

## 2013-12-11 DIAGNOSIS — N3010 Interstitial cystitis (chronic) without hematuria: Secondary | ICD-10-CM | POA: Diagnosis not present

## 2013-12-11 DIAGNOSIS — J45909 Unspecified asthma, uncomplicated: Secondary | ICD-10-CM

## 2013-12-11 DIAGNOSIS — M1711 Unilateral primary osteoarthritis, right knee: Secondary | ICD-10-CM

## 2013-12-11 LAB — COMPLETE METABOLIC PANEL WITH GFR
ALBUMIN: 4.3 g/dL (ref 3.5–5.2)
ALT: 27 U/L (ref 0–35)
AST: 25 U/L (ref 0–37)
Alkaline Phosphatase: 96 U/L (ref 39–117)
BILIRUBIN TOTAL: 0.4 mg/dL (ref 0.2–1.2)
BUN: 13 mg/dL (ref 6–23)
CALCIUM: 10.1 mg/dL (ref 8.4–10.5)
CHLORIDE: 102 meq/L (ref 96–112)
CO2: 29 mEq/L (ref 19–32)
Creat: 0.97 mg/dL (ref 0.50–1.10)
GFR, EST NON AFRICAN AMERICAN: 65 mL/min
GFR, Est African American: 75 mL/min
GLUCOSE: 81 mg/dL (ref 70–99)
Potassium: 4.9 mEq/L (ref 3.5–5.3)
SODIUM: 143 meq/L (ref 135–145)
Total Protein: 6.8 g/dL (ref 6.0–8.3)

## 2013-12-11 NOTE — Progress Notes (Signed)
Patient here to establish care Patient does follow up with [facility] for her psych medications Patient requesting refills on her daily medications Patient would like flu vaccine and a pneumonia vaccine

## 2013-12-11 NOTE — Patient Instructions (Signed)
DASH Eating Plan °DASH stands for "Dietary Approaches to Stop Hypertension." The DASH eating plan is a healthy eating plan that has been shown to reduce high blood pressure (hypertension). Additional health benefits may include reducing the risk of type 2 diabetes mellitus, heart disease, and stroke. The DASH eating plan may also help with weight loss. °WHAT DO I NEED TO KNOW ABOUT THE DASH EATING PLAN? °For the DASH eating plan, you will follow these general guidelines: °· Choose foods with a percent daily value for sodium of less than 5% (as listed on the food label). °· Use salt-free seasonings or herbs instead of table salt or sea salt. °· Check with your health care provider or pharmacist before using salt substitutes. °· Eat lower-sodium products, often labeled as "lower sodium" or "no salt added." °· Eat fresh foods. °· Eat more vegetables, fruits, and low-fat dairy products. °· Choose whole grains. Look for the word "whole" as the first word in the ingredient list. °· Choose fish and skinless chicken or turkey more often than red meat. Limit fish, poultry, and meat to 6 oz (170 g) each day. °· Limit sweets, desserts, sugars, and sugary drinks. °· Choose heart-healthy fats. °· Limit cheese to 1 oz (28 g) per day. °· Eat more home-cooked food and less restaurant, buffet, and fast food. °· Limit fried foods. °· Cook foods using methods other than frying. °· Limit canned vegetables. If you do use them, rinse them well to decrease the sodium. °· When eating at a restaurant, ask that your food be prepared with less salt, or no salt if possible. °WHAT FOODS CAN I EAT? °Seek help from a dietitian for individual calorie needs. °Grains °Whole grain or whole wheat bread. Brown rice. Whole grain or whole wheat pasta. Quinoa, bulgur, and whole grain cereals. Low-sodium cereals. Corn or whole wheat flour tortillas. Whole grain cornbread. Whole grain crackers. Low-sodium crackers. °Vegetables °Fresh or frozen vegetables  (raw, steamed, roasted, or grilled). Low-sodium or reduced-sodium tomato and vegetable juices. Low-sodium or reduced-sodium tomato sauce and paste. Low-sodium or reduced-sodium canned vegetables.  °Fruits °All fresh, canned (in natural juice), or frozen fruits. °Meat and Other Protein Products °Ground beef (85% or leaner), grass-fed beef, or beef trimmed of fat. Skinless chicken or turkey. Ground chicken or turkey. Pork trimmed of fat. All fish and seafood. Eggs. Dried beans, peas, or lentils. Unsalted nuts and seeds. Unsalted canned beans. °Dairy °Low-fat dairy products, such as skim or 1% milk, 2% or reduced-fat cheeses, low-fat ricotta or cottage cheese, or plain low-fat yogurt. Low-sodium or reduced-sodium cheeses. °Fats and Oils °Tub margarines without trans fats. Light or reduced-fat mayonnaise and salad dressings (reduced sodium). Avocado. Safflower, olive, or canola oils. Natural peanut or almond butter. °Other °Unsalted popcorn and pretzels. °The items listed above may not be a complete list of recommended foods or beverages. Contact your dietitian for more options. °WHAT FOODS ARE NOT RECOMMENDED? °Grains °White bread. White pasta. White rice. Refined cornbread. Bagels and croissants. Crackers that contain trans fat. °Vegetables °Creamed or fried vegetables. Vegetables in a cheese sauce. Regular canned vegetables. Regular canned tomato sauce and paste. Regular tomato and vegetable juices. °Fruits °Dried fruits. Canned fruit in light or heavy syrup. Fruit juice. °Meat and Other Protein Products °Fatty cuts of meat. Ribs, chicken wings, bacon, sausage, bologna, salami, chitterlings, fatback, hot dogs, bratwurst, and packaged luncheon meats. Salted nuts and seeds. Canned beans with salt. °Dairy °Whole or 2% milk, cream, half-and-half, and cream cheese. Whole-fat or sweetened yogurt. Full-fat   cheeses or blue cheese. Nondairy creamers and whipped toppings. Processed cheese, cheese spreads, or cheese  curds. °Condiments °Onion and garlic salt, seasoned salt, table salt, and sea salt. Canned and packaged gravies. Worcestershire sauce. Tartar sauce. Barbecue sauce. Teriyaki sauce. Soy sauce, including reduced sodium. Steak sauce. Fish sauce. Oyster sauce. Cocktail sauce. Horseradish. Ketchup and mustard. Meat flavorings and tenderizers. Bouillon cubes. Hot sauce. Tabasco sauce. Marinades. Taco seasonings. Relishes. °Fats and Oils °Butter, stick margarine, lard, shortening, ghee, and bacon fat. Coconut, palm kernel, or palm oils. Regular salad dressings. °Other °Pickles and olives. Salted popcorn and pretzels. °The items listed above may not be a complete list of foods and beverages to avoid. Contact your dietitian for more information. °WHERE CAN I FIND MORE INFORMATION? °[employer], [employer], [employer]: www.nhlbi.nih.gov/health/health-topics/topics/dash/ °Document Released: 01/14/2011 Document Revised: 06/11/2013 Document Reviewed: 11/29/2012 °[employer]® Patient Information ©2015 [employer]. This information is not intended to replace advice given to you by your health care provider. Make sure you discuss any questions you have with your health care provider. ° °

## 2013-12-11 NOTE — Progress Notes (Signed)
Patient Demographics  [name], is a 58 y.o. female  NTI:[id number]  QQP:[id number]  DOB - [birth year]  CC:  Chief Complaint  Patient presents with  . Establish Care       HPI: [name] is a 58 y.o. female here today to establish medical care.patient has history of hypertension, bipolar disorder, tremors, asthma, GERD/IBS, osteoarthritis, interstitial cystitis, as per patient she has been following up with orthopedics and as per patient it was recommended to her to get right knee surgery done, patient also follows up with the urologist and takes oxybutynin, following up at [facility] for bipolar disorder and is on Neurontin, lithium, Depakote, Zoloft, as per patient she was also prescribed Nexium for GERD symptoms which she has not started yet. Patient has No headache, No chest pain, No abdominal pain - No Nausea, No new weakness tingling or numbness, No Cough - SOB.  Allergies  Allergen Reactions  . Pseudoephedrine Other (See Comments)    I fly off the walls   . Risperidone And Related Other (See Comments)    Stomach upset, insomnia, drooling, tremors, "jerks," sensitivity to touch.   . Aspirin Other (See Comments)    REACTION: upset stomach  . Bupivacaine Hcl Hives  . Codeine Itching  . Etodolac Rash  . Hydrocodone-Acetaminophen Rash  . Indomethacin Other (See Comments)    Muscle spasms  . Ivp Dye [Iodinated Diagnostic Agents] Other (See Comments)    Flushed and Fever  . Pentazocine Lactate Itching  . Propoxyphene N-Acetaminophen Itching  . Propranolol Nausea Only  . Sulfonamide Derivatives Other (See Comments)    unknown  . Tramadol Hcl Other (See Comments)    unknown   Past Medical History  Diagnosis Date  . Bipolar 1 disorder   . Thyroid disease   . 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 Complication of anesthesia   . Asthma   . Hypothyroidism   . Shortness of breath   . Sleep apnea   . GERD (gastroesophageal reflux disease)   . Headache(784.0)     . Neuromuscular disorder 02/08/2012    TREMORS   . Arthritis    Current Outpatient Prescriptions on File Prior to Visit  Medication Sig Dispense Refill  . albuterol (PROVENTIL HFA;VENTOLIN HFA) 108 (90 BASE) MCG/ACT inhaler Inhale 1-2 puffs into the lungs daily as needed for wheezing or shortness of breath.    Marland Kitchen amitriptyline (ELAVIL) 25 MG tablet Take 25 mg by mouth at bedtime.    Marland Kitchen anti-nausea (EMETROL) solution Take 30 mLs by mouth every 15 (fifteen) minutes as needed for nausea or vomiting.    . beclomethasone (QVAR) 80 MCG/ACT inhaler Inhale 2 puffs into the lungs 2 (two) times daily.    . celecoxib (CELEBREX) 100 MG capsule Take 100 mg by mouth 2 (two) times daily.    . cetirizine (ZYRTEC) 10 MG tablet Take 10 mg by mouth daily.    . divalproex (DEPAKOTE ER) 500 MG 24 hr tablet Take 1,000 mg by mouth at bedtime.    . gabapentin (NEURONTIN) 300 MG capsule Take 300 mg by mouth 3 (three) times daily.    . hydrochlorothiazide (MICROZIDE) 12.5 MG capsule Take 12.5 mg by mouth daily.    . hydrOXYzine (ATARAX/VISTARIL) 25 MG tablet Take 25 mg by mouth at bedtime.    Marland Kitchen lithium carbonate 300 MG capsule Take 300 mg by mouth daily.    Marland Kitchen loperamide (IMODIUM A-D) 2 MG tablet Take 2 mg by  mouth 4 (four) times daily as needed for diarrhea or loose stools.    . naproxen sodium (ANAPROX) 220 MG tablet Take 880 mg by mouth 2 (two) times daily as needed (headache).    . ondansetron (ZOFRAN) 4 MG tablet Take 1 tablet (4 mg total) by mouth every 6 (six) hours. 12 tablet 0  . primidone (MYSOLINE) 50 MG tablet Take 50 mg by mouth at bedtime.    . rizatriptan (MAXALT-MLT) 5 MG disintegrating tablet Take 5 mg by mouth daily as needed for migraine. May repeat in 2 hours if needed    . sertraline (ZOLOFT) 100 MG tablet Take 2 tablets (200 mg total) by mouth daily. 14 tablet 0   No current facility-administered medications on file prior to visit.   Family History  Problem Relation Age of Onset  . Coronary  artery disease    . Hypertension Mother   . Thyroid disease Mother   . Cancer Father    History   Social History  . Marital Status: Divorced    Spouse Name: N/A    Number of Children: N/A  . Years of Education: N/A   Occupational History  . Not on file.   Social History Main Topics  . Smoking status: Never Smoker   . Smokeless tobacco: Never Used  . Alcohol Use: Yes     Comment: rarely  . Drug Use: No  . Sexual Activity: Not Currently   Other Topics Concern  . Not on file   Social History Narrative    Review of Systems: Constitutional: Negative for fever, chills, diaphoresis, activity change, appetite change and fatigue. HENT: Negative for ear pain, nosebleeds, congestion, facial swelling, rhinorrhea, neck pain, neck stiffness and ear discharge.  Eyes: Negative for pain, discharge, redness, itching and visual disturbance. Respiratory: Negative for cough, choking, chest tightness, shortness of breath, wheezing and stridor.  Cardiovascular: Negative for chest pain, palpitations and leg swelling. Gastrointestinal: Negative for abdominal distention. Genitourinary: Negative for dysuria, urgency, frequency, hematuria, flank pain, decreased urine volume, difficulty urinating and dyspareunia.  Musculoskeletal: Negative for back pain, joint swelling, arthralgia and gait problem. Neurological: Negative for dizziness, tremors, seizures, syncope, facial asymmetry, speech difficulty, weakness, light-headedness, numbness and headaches.  Hematological: Negative for adenopathy. Does not bruise/bleed easily. Psychiatric/Behavioral: Negative for hallucinations, behavioral problems, confusion, dysphoric mood, decreased concentration and agitation.    Objective:   Filed Vitals:   12/11/13 1418  BP: 125/78  Pulse: 67  Temp: 98.6 F (37 C)  Resp: 16    Physical Exam: Constitutional: Patient appears well-developed and well-nourished. No distress. HENT: Normocephalic, atraumatic,  External right and left ear normal. Oropharynx is clear and moist.  Eyes: Conjunctivae and EOM are normal. PERRLA, no scleral icterus. Neck: Normal ROM. Neck supple. No JVD. No tracheal deviation. No thyromegaly. CVS: RRR, S1/S2 +, no murmurs, no gallops, no carotid bruit.  Pulmonary: Effort and breath sounds normal, no stridor, rhonchi, wheezes, rales.  Abdominal: Soft. BS +, no distension, tenderness, rebound or guarding.  Musculoskeletal: Normal range of motion. No edema and no tenderness. Right knee crepitation+ Neuro: Alert. Normal reflexes, muscle tone coordination. No cranial nerve deficit. Skin: Skin is warm and dry. No rash noted. Not diaphoretic. No erythema. No pallor. Psychiatric: Normal mood and affect. Behavior, judgment, thought content normal.  Lab Results  Component Value Date   WBC 8.4 09/14/2013   HGB 13.3 09/14/2013   HCT 38.5 09/14/2013   MCV 90.0 09/14/2013   PLT 218 09/14/2013   Lab Results  Component Value Date   CREATININE 0.82 09/14/2013   BUN 8 09/14/2013   NA 146 09/14/2013   K 3.1* 09/14/2013   CL 107 09/14/2013   CO2 21 09/14/2013    No results found for: HGBA1C Lipid Panel     Component Value Date/Time   CHOL 170 08/22/2007 1546   TRIG 199* 08/22/2007 1546   HDL 47.6 08/22/2007 1546   CHOLHDL 3.6 CALC 08/22/2007 1546   VLDL 40 08/22/2007 1546   LDLCALC 83 08/22/2007 1546       Assessment and plan:   1. Essential hypertension Blood pressure is well controlled, currently patient is on hydrochlorothiazide, advised for DASH diet - COMPLETE METABOLIC PANEL WITH GFR  2. Asthma, unspecified asthma severity, uncomplicated  Patient is on Qvar and uses albuterol when necessary  3. DSORD BIPOLAR I, UNSPC, MOST RECENT EPSD Patient is on  depakote , Neurontin, lithium, Zoloft as per patient she follows up at [facility] every 3 months.  4. Tremor Patient is on primidone which as per patient have her with the symptoms  5. Gastroesophageal reflux  disease, esophagitis presence not specified Advised for lifestyle modification Patient has prescription for nexium   6. Osteoarthritis of right knee, unspecified osteoarthritis type Patient is following up with Orthopedics in [place]   7. INTERSTITIAL CYSTITIS Following up with urology and is on oxybutynin   8. History of hypothyroidism As per patient she used to be on thyroid medication in the past, we'll check TSH and free T4 level. - TSH - T4, free;   9. Special screening for malignant neoplasms, colon  - Ambulatory referral to Gastroenterology      Health Maintenance -Colonoscopy: referred to GYN  -Mammogram: uptodate  -Vaccinations:  Flu shot and pneumovax today   Return in about 3 months (around 03/13/2014) for hypertension.    Lorayne Marek, MD

## 2013-12-12 LAB — TSH: TSH: 1.645 u[IU]/mL (ref 0.350–4.500)

## 2013-12-12 LAB — T4, FREE: Free T4: 0.91 ng/dL (ref 0.80–1.80)

## 2013-12-14 ENCOUNTER — Other Ambulatory Visit: Payer: Self-pay | Admitting: *Deleted

## 2013-12-14 ENCOUNTER — Telehealth: Payer: Self-pay | Admitting: *Deleted

## 2013-12-14 MED ORDER — ESOMEPRAZOLE MAGNESIUM 40 MG PO CPDR: 40.0000 mg | DELAYED_RELEASE_CAPSULE | Freq: Every day | ORAL | Status: DC

## 2013-12-14 MED ORDER — PRIMIDONE 50 MG PO TABS: 50.0000 mg | ORAL_TABLET | Freq: Every day | ORAL | Status: DC

## 2013-12-14 NOTE — Telephone Encounter (Signed)
-----   Message from Lorayne Marek, MD sent at 12/12/2013 12:54 PM EST ----- Call and let the Patient know that blood work is normal.

## 2013-12-14 NOTE — Telephone Encounter (Signed)
Pt is aware of her lab results.  

## 2013-12-14 NOTE — Progress Notes (Unsigned)
Pt called needing prescriptions for primidone and nexium. I sent these medications to our pharmacy.

## 2014-01-10 ENCOUNTER — Encounter: Payer: Self-pay | Admitting: Internal Medicine

## 2014-01-25 ENCOUNTER — Telehealth: Payer: Self-pay | Admitting: Internal Medicine

## 2014-02-14 ENCOUNTER — Other Ambulatory Visit: Payer: Self-pay

## 2014-02-14 MED ORDER — HYDROCHLOROTHIAZIDE 12.5 MG PO CAPS: 12.5000 mg | ORAL_CAPSULE | Freq: Every day | ORAL | Status: DC

## 2014-02-18 ENCOUNTER — Other Ambulatory Visit: Payer: Self-pay

## 2014-02-18 MED ORDER — AMLODIPINE BESYLATE 5 MG PO TABS: 5.0000 mg | ORAL_TABLET | Freq: Every day | ORAL | Status: DC

## 2014-02-18 NOTE — Progress Notes (Unsigned)
Spoke with christan in the pharmacy There is an interaction with lithium and HCTZ Medication was changed to Norvasc 5mg  daily per Dr Annitta Needs Prescription sent to pharmacy

## 2014-03-15 ENCOUNTER — Encounter: Payer: Self-pay | Admitting: Internal Medicine

## 2014-03-15 ENCOUNTER — Ambulatory Visit: Payer: Medicaid Other | Attending: Internal Medicine | Admitting: Internal Medicine

## 2014-03-15 VITALS — BP 131/82 | HR 73 | Temp 98.0°F | Resp 16 | Wt 160.4 lb

## 2014-03-15 DIAGNOSIS — Z7951 Long term (current) use of inhaled steroids: Secondary | ICD-10-CM | POA: Insufficient documentation

## 2014-03-15 DIAGNOSIS — F319 Bipolar disorder, unspecified: Secondary | ICD-10-CM | POA: Diagnosis not present

## 2014-03-15 DIAGNOSIS — Z792 Long term (current) use of antibiotics: Secondary | ICD-10-CM | POA: Diagnosis not present

## 2014-03-15 DIAGNOSIS — I10 Essential (primary) hypertension: Secondary | ICD-10-CM | POA: Diagnosis not present

## 2014-03-15 DIAGNOSIS — K219 Gastro-esophageal reflux disease without esophagitis: Secondary | ICD-10-CM | POA: Diagnosis not present

## 2014-03-15 DIAGNOSIS — J45909 Unspecified asthma, uncomplicated: Secondary | ICD-10-CM | POA: Insufficient documentation

## 2014-03-15 DIAGNOSIS — J0100 Acute maxillary sinusitis, unspecified: Secondary | ICD-10-CM | POA: Insufficient documentation

## 2014-03-15 DIAGNOSIS — Z791 Long term (current) use of non-steroidal anti-inflammatories (NSAID): Secondary | ICD-10-CM | POA: Insufficient documentation

## 2014-03-15 DIAGNOSIS — Z79899 Other long term (current) drug therapy: Secondary | ICD-10-CM | POA: Insufficient documentation

## 2014-03-15 DIAGNOSIS — Z9049 Acquired absence of other specified parts of digestive tract: Secondary | ICD-10-CM | POA: Insufficient documentation

## 2014-03-15 DIAGNOSIS — R0981 Nasal congestion: Secondary | ICD-10-CM

## 2014-03-15 DIAGNOSIS — R51 Headache: Secondary | ICD-10-CM | POA: Diagnosis not present

## 2014-03-15 MED ORDER — RANITIDINE HCL 150 MG PO TABS: 150.0000 mg | ORAL_TABLET | Freq: Every day | ORAL | Status: DC

## 2014-03-15 MED ORDER — PRIMIDONE 50 MG PO TABS: 50.0000 mg | ORAL_TABLET | Freq: Every day | ORAL | Status: DC

## 2014-03-15 MED ORDER — AMOXICILLIN-POT CLAVULANATE 875-125 MG PO TABS: 1.0000 | ORAL_TABLET | Freq: Two times a day (BID) | ORAL | Status: DC

## 2014-03-15 MED ORDER — FLUTICASONE PROPIONATE 50 MCG/ACT NA SUSP: 2.0000 | Freq: Every day | NASAL | Status: DC

## 2014-03-15 MED ORDER — RANITIDINE HCL 150 MG PO TABS: 300.0000 mg | ORAL_TABLET | Freq: Every day | ORAL | Status: DC

## 2014-03-15 NOTE — Patient Instructions (Signed)
DASH Eating Plan °DASH stands for "Dietary Approaches to Stop Hypertension." The DASH eating plan is a healthy eating plan that has been shown to reduce high blood pressure (hypertension). Additional health benefits may include reducing the risk of type 2 diabetes mellitus, heart disease, and stroke. The DASH eating plan may also help with weight loss. °WHAT DO I NEED TO KNOW ABOUT THE DASH EATING PLAN? °For the DASH eating plan, you will follow these general guidelines: °· Choose foods with a percent daily value for sodium of less than 5% (as listed on the food label). °· Use salt-free seasonings or herbs instead of table salt or sea salt. °· Check with your health care provider or pharmacist before using salt substitutes. °· Eat lower-sodium products, often labeled as "lower sodium" or "no salt added." °· Eat fresh foods. °· Eat more vegetables, fruits, and low-fat dairy products. °· Choose whole grains. Look for the word "whole" as the first word in the ingredient list. °· Choose fish and skinless chicken or turkey more often than red meat. Limit fish, poultry, and meat to 6 oz (170 g) each day. °· Limit sweets, desserts, sugars, and sugary drinks. °· Choose heart-healthy fats. °· Limit cheese to 1 oz (28 g) per day. °· Eat more home-cooked food and less restaurant, buffet, and fast food. °· Limit fried foods. °· Cook foods using methods other than frying. °· Limit canned vegetables. If you do use them, rinse them well to decrease the sodium. °· When eating at a restaurant, ask that your food be prepared with less salt, or no salt if possible. °WHAT FOODS CAN I EAT? °Seek help from a dietitian for individual calorie needs. °Grains °Whole grain or whole wheat bread. Brown rice. Whole grain or whole wheat pasta. Quinoa, bulgur, and whole grain cereals. Low-sodium cereals. Corn or whole wheat flour tortillas. Whole grain cornbread. Whole grain crackers. Low-sodium crackers. °Vegetables °Fresh or frozen vegetables  (raw, steamed, roasted, or grilled). Low-sodium or reduced-sodium tomato and vegetable juices. Low-sodium or reduced-sodium tomato sauce and paste. Low-sodium or reduced-sodium canned vegetables.  °Fruits °All fresh, canned (in natural juice), or frozen fruits. °Meat and Other Protein Products °Ground beef (85% or leaner), grass-fed beef, or beef trimmed of fat. Skinless chicken or turkey. Ground chicken or turkey. Pork trimmed of fat. All fish and seafood. Eggs. Dried beans, peas, or lentils. Unsalted nuts and seeds. Unsalted canned beans. °Dairy °Low-fat dairy products, such as skim or 1% milk, 2% or reduced-fat cheeses, low-fat ricotta or cottage cheese, or plain low-fat yogurt. Low-sodium or reduced-sodium cheeses. °Fats and Oils °Tub margarines without trans fats. Light or reduced-fat mayonnaise and salad dressings (reduced sodium). Avocado. Safflower, olive, or canola oils. Natural peanut or almond butter. °Other °Unsalted popcorn and pretzels. °The items listed above may not be a complete list of recommended foods or beverages. Contact your dietitian for more options. °WHAT FOODS ARE NOT RECOMMENDED? °Grains °White bread. White pasta. White rice. Refined cornbread. Bagels and croissants. Crackers that contain trans fat. °Vegetables °Creamed or fried vegetables. Vegetables in a cheese sauce. Regular canned vegetables. Regular canned tomato sauce and paste. Regular tomato and vegetable juices. °Fruits °Dried fruits. Canned fruit in light or heavy syrup. Fruit juice. °Meat and Other Protein Products °Fatty cuts of meat. Ribs, chicken wings, bacon, sausage, bologna, salami, chitterlings, fatback, hot dogs, bratwurst, and packaged luncheon meats. Salted nuts and seeds. Canned beans with salt. °Dairy °Whole or 2% milk, cream, half-and-half, and cream cheese. Whole-fat or sweetened yogurt. Full-fat   cheeses or blue cheese. Nondairy creamers and whipped toppings. Processed cheese, cheese spreads, or cheese  curds. °Condiments °Onion and garlic salt, seasoned salt, table salt, and sea salt. Canned and packaged gravies. Worcestershire sauce. Tartar sauce. Barbecue sauce. Teriyaki sauce. Soy sauce, including reduced sodium. Steak sauce. Fish sauce. Oyster sauce. Cocktail sauce. Horseradish. Ketchup and mustard. Meat flavorings and tenderizers. Bouillon cubes. Hot sauce. Tabasco sauce. Marinades. Taco seasonings. Relishes. °Fats and Oils °Butter, stick margarine, lard, shortening, ghee, and bacon fat. Coconut, palm kernel, or palm oils. Regular salad dressings. °Other °Pickles and olives. Salted popcorn and pretzels. °The items listed above may not be a complete list of foods and beverages to avoid. Contact your dietitian for more information. °WHERE CAN I FIND MORE INFORMATION? °[employer], [employer], [employer]: www.nhlbi.nih.gov/health/health-topics/topics/dash/ °Document Released: 01/14/2011 Document Revised: 06/11/2013 Document Reviewed: 11/29/2012 °[employer]® Patient Information ©2015 [employer]. This information is not intended to replace advice given to you by your health care provider. Make sure you discuss any questions you have with your health care provider. ° °

## 2014-03-15 NOTE — Progress Notes (Signed)
MRN: [id number] Name: [name]  Sex: female Age: 59 y.o. DOB: [birth year]  Allergies: Pseudoephedrine; Risperidone and related; Aspirin; Bupivacaine hcl; Codeine; Etodolac; Hydrocodone-acetaminophen; Indomethacin; Ivp dye; Pentazocine lactate; Propoxyphene n-acetaminophen; Propranolol; Sulfonamide derivatives; and Tramadol hcl  Chief Complaint  Patient presents with  . Follow-up    HPI: Patient is 59 y.o. female who has history of hypertension, GERD , comes today complaining of nasal congestion postnasal drip minimal productive cough ear fullness for the last 2 weeks denies any fever chills chest and shortness of breath, she has tried over-the-counter medication without much improvement, she is also complaining of worsening reflux symptoms currently she is taking Nexium as per patient it does not help her much, she denies smoking cigarettes.  Past Medical History  Diagnosis Date  . Bipolar 1 disorder   . Thyroid disease   . 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 Complication of anesthesia   . Asthma   . Hypothyroidism   . Shortness of breath   . Sleep apnea   . GERD (gastroesophageal reflux disease)   . Headache(784.0)   . Neuromuscular disorder 02/08/2012    TREMORS   . Arthritis     Past Surgical History  Procedure Laterality Date  . Cholecystectomy    . Tonsillectomy    . Cystoscopy    . Knee surgery    . Carpal tunnel release        Medication List       This list is accurate as of: 03/15/14 11:23 AM.  Always use your most recent med list.               albuterol 108 (90 BASE) MCG/ACT inhaler  Commonly known as:  PROVENTIL HFA;VENTOLIN HFA  Inhale 1-2 puffs into the lungs daily as needed for wheezing or shortness of breath.     amitriptyline 25 MG tablet  Commonly known as:  ELAVIL  Take 25 mg by mouth at bedtime.     amLODipine 5 MG tablet  Commonly known as:  NORVASC  Take 1 tablet (5 mg total) by mouth daily.     amoxicillin-clavulanate  875-125 MG per tablet  Commonly known as:  AUGMENTIN  Take 1 tablet by mouth 2 (two) times daily.     anti-nausea solution  Take 30 mLs by mouth every 15 (fifteen) minutes as needed for nausea or vomiting.     beclomethasone 80 MCG/ACT inhaler  Commonly known as:  QVAR  Inhale 2 puffs into the lungs 2 (two) times daily.     celecoxib 100 MG capsule  Commonly known as:  CELEBREX  Take 100 mg by mouth 2 (two) times daily.     cetirizine 10 MG tablet  Commonly known as:  ZYRTEC  Take 10 mg by mouth daily.     divalproex 500 MG 24 hr tablet  Commonly known as:  DEPAKOTE ER  Take 1,000 mg by mouth at bedtime.     esomeprazole 40 MG capsule  Commonly known as:  NEXIUM  Take 1 capsule (40 mg total) by mouth daily.     fluticasone 50 MCG/ACT nasal spray  Commonly known as:  FLONASE  Place 2 sprays into both nostrils daily.     gabapentin 300 MG capsule  Commonly known as:  NEURONTIN  Take 300 mg by mouth 3 (three) times daily.     hydrochlorothiazide 12.5 MG capsule  Commonly known as:  MICROZIDE  Take 1 capsule (12.5 mg  total) by mouth daily.     hydrOXYzine 25 MG tablet  Commonly known as:  ATARAX/VISTARIL  Take 25 mg by mouth at bedtime.     lithium carbonate 300 MG capsule  Take 300 mg by mouth daily.     loperamide 2 MG tablet  Commonly known as:  IMODIUM A-D  Take 2 mg by mouth 4 (four) times daily as needed for diarrhea or loose stools.     naproxen sodium 220 MG tablet  Commonly known as:  ANAPROX  Take 880 mg by mouth 2 (two) times daily as needed (headache).     ondansetron 4 MG tablet  Commonly known as:  ZOFRAN  Take 1 tablet (4 mg total) by mouth every 6 (six) hours.     primidone 50 MG tablet  Commonly known as:  MYSOLINE  Take 50 mg by mouth at bedtime.     primidone 50 MG tablet  Commonly known as:  MYSOLINE  Take 1 tablet (50 mg total) by mouth at bedtime.     ranitidine 150 MG tablet  Commonly known as:  ZANTAC  Take 2 tablets (300 mg  total) by mouth at bedtime.     rizatriptan 5 MG disintegrating tablet  Commonly known as:  MAXALT-MLT  Take 5 mg by mouth daily as needed for migraine. May repeat in 2 hours if needed     sertraline 100 MG tablet  Commonly known as:  ZOLOFT  Take 2 tablets (200 mg total) by mouth daily.        Meds ordered this encounter  Medications  . amoxicillin-clavulanate (AUGMENTIN) 875-125 MG per tablet    Sig: Take 1 tablet by mouth 2 (two) times daily.    Dispense:  20 tablet    Refill:  0  . fluticasone (FLONASE) 50 MCG/ACT nasal spray    Sig: Place 2 sprays into both nostrils daily.    Dispense:  16 g    Refill:  6  . ranitidine (ZANTAC) 150 MG tablet    Sig: Take 2 tablets (300 mg total) by mouth at bedtime.    Dispense:  30 tablet    Refill:  3    Immunization History  Administered Date(s) Administered  . Influenza Split 02/09/2012  . Influenza Whole 12/08/2006, 12/01/2007  . Influenza,inj,Quad PF,36+ Mos 12/11/2013  . Pneumococcal Polysaccharide-23 12/12/2013  . Tdap 04/16/2011, 06/25/2013    Family History  Problem Relation Age of Onset  . Coronary artery disease    . Hypertension Mother   . Thyroid disease Mother   . Cancer Father     History  Substance Use Topics  . Smoking status: Never Smoker   . Smokeless tobacco: Never Used  . Alcohol Use: Yes     Comment: rarely    Review of Systems   As noted in HPI  Filed Vitals:   03/15/14 1055  BP: 131/82  Pulse: 73  Temp: 98 F (36.7 C)  Resp: 16    Physical Exam  Physical Exam  HENT:  Sinus congestion, tenderness   Cardiovascular: Normal rate and regular rhythm.   Pulmonary/Chest: Breath sounds normal. No respiratory distress. She has no wheezes. She has no rales.  Abdominal: Soft. There is no tenderness. There is no rebound.    CBC    Component Value Date/Time   WBC 8.4 09/14/2013 1227   RBC 4.28 09/14/2013 1227   HGB 13.3 09/14/2013 1227   HCT 38.5 09/14/2013 1227   PLT 218 09/14/2013  1227  MCV 90.0 09/14/2013 1227   LYMPHSABS 2.7 09/14/2013 1227   MONOABS 0.8 09/14/2013 1227   EOSABS 0.1 09/14/2013 1227   BASOSABS 0.0 09/14/2013 1227    CMP     Component Value Date/Time   NA 143 12/11/2013 1452   K 4.9 12/11/2013 1452   CL 102 12/11/2013 1452   CO2 29 12/11/2013 1452   GLUCOSE 81 12/11/2013 1452   BUN 13 12/11/2013 1452   CREATININE 0.97 12/11/2013 1452   CREATININE 0.82 09/14/2013 1227   CALCIUM 10.1 12/11/2013 1452   PROT 6.8 12/11/2013 1452   ALBUMIN 4.3 12/11/2013 1452   AST 25 12/11/2013 1452   ALT 27 12/11/2013 1452   ALKPHOS 96 12/11/2013 1452   BILITOT 0.4 12/11/2013 1452   GFRNONAA 65 12/11/2013 1452   GFRNONAA 79* 09/14/2013 1227   GFRAA 75 12/11/2013 1452   GFRAA >90 09/14/2013 1227    Lab Results  Component Value Date/Time   CHOL 170 08/22/2007 03:46 PM    No components found for: HGA1C  Lab Results  Component Value Date/Time   AST 25 12/11/2013 02:52 PM    Assessment and Plan  Essential hypertension Blood pressure is well controlled, continue with current meds.  Gastroesophageal reflux disease, esophagitis presence not specified - Plan: Patient will continue with Nexium, I have added ranitidine (ZANTAC) 150 MG tablet to take at night , Ambulatory referral to Gastroenterology  Acute maxillary sinusitis, recurrence not specified - Plan: amoxicillin-clavulanate (AUGMENTIN) 875-125 MG per tablet  Nasal congestion - Plan: fluticasone (FLONASE) 50 MCG/ACT nasal spray   Health Maintenance -Colonoscopy: scheduled   -Mammogram: patient will schedule  -Vaccinations:  uptodate with flu shot and pneumovax   Return in about 3 months (around 06/13/2014) for hypertension.  Lorayne Marek, MD

## 2014-03-15 NOTE — Progress Notes (Signed)
Patient here for follow up Complains that her nexium is not working -asking for [employer] for dexilant  Still waking up with heart burn Having some sinus pressure under her eyes Blowing out greenish/yellow mucous Has been gaining weight and does not know why

## 2014-03-18 ENCOUNTER — Ambulatory Visit (AMBULATORY_SURGERY_CENTER): Payer: Self-pay | Admitting: *Deleted

## 2014-03-18 ENCOUNTER — Telehealth: Payer: Self-pay | Admitting: *Deleted

## 2014-03-18 VITALS — Ht 60.5 in | Wt 154.0 lb

## 2014-03-18 DIAGNOSIS — Z1211 Encounter for screening for malignant neoplasm of colon: Secondary | ICD-10-CM

## 2014-03-18 MED ORDER — MOVIPREP 100 G PO SOLR: 1.0000 | Freq: Once | ORAL | Status: DC

## 2014-03-18 NOTE — Telephone Encounter (Signed)
Dr Olevia Perches, I saw Ms.Mcpeters today in [place]. She is scheduled for a colon with you 2-23 Tuesday  At 1000 am. She had an EGD with you 12-17-2004 that showed gastritis and  Gastroparesis. She states her PCP DR Advani suggested she also have an EGd due to severe reflux symptoms she is having despite taking ranitidine and generic nexium. She denies any dysphagia. Do you want her to have an OV to discuss her symptoms or do you want to add an EGD to her colon? Pt is aware she may have to come in or have her procedure date changed. Please advise Thanks for your time,  Marijean Niemann

## 2014-03-18 NOTE — Progress Notes (Signed)
No home 02 use No egg or soy allergy Pt states she is having severe reflux and wants an EGD added to her colon. Will send TE to Dr Olevia Perches, pt aware. Has c pap but doesn't use, hasn't used in about 1 year

## 2014-03-18 NOTE — Telephone Encounter (Signed)
I know the patient. Hx of GERD. OK to add EGD with colonoscopy, but don't reschedule. I am starting procedure at 7.30 am  Beginning next week so more pt's can be accommodated.

## 2014-03-19 NOTE — Telephone Encounter (Signed)
Dr. Nichola Sizer recommendations noted.  I changed appointment to 60 minutes to accommodate endoscopy and colonoscopy.  Pt is aware and knows to follow instructions given at her PV

## 2014-03-29 ENCOUNTER — Other Ambulatory Visit: Payer: Self-pay | Admitting: Internal Medicine

## 2014-03-29 DIAGNOSIS — Z1231 Encounter for screening mammogram for malignant neoplasm of breast: Secondary | ICD-10-CM

## 2014-04-02 ENCOUNTER — Encounter: Payer: Self-pay | Admitting: Internal Medicine

## 2014-04-02 ENCOUNTER — Ambulatory Visit (AMBULATORY_SURGERY_CENTER): Payer: Medicaid Other | Admitting: Internal Medicine

## 2014-04-02 VITALS — BP 123/49 | HR 68 | Temp 97.9°F | Resp 14 | Ht 60.0 in | Wt 154.0 lb

## 2014-04-02 DIAGNOSIS — K2970 Gastritis, unspecified, without bleeding: Secondary | ICD-10-CM

## 2014-04-02 DIAGNOSIS — Z1211 Encounter for screening for malignant neoplasm of colon: Secondary | ICD-10-CM

## 2014-04-02 DIAGNOSIS — K219 Gastro-esophageal reflux disease without esophagitis: Secondary | ICD-10-CM

## 2014-04-02 DIAGNOSIS — K2990 Gastroduodenitis, unspecified, without bleeding: Secondary | ICD-10-CM

## 2014-04-02 DIAGNOSIS — D120 Benign neoplasm of cecum: Secondary | ICD-10-CM

## 2014-04-02 DIAGNOSIS — K635 Polyp of colon: Secondary | ICD-10-CM

## 2014-04-02 MED ORDER — ESOMEPRAZOLE MAGNESIUM 40 MG PO CPDR: DELAYED_RELEASE_CAPSULE | ORAL | Status: DC

## 2014-04-02 MED ORDER — SODIUM CHLORIDE 0.9 % IV SOLN: 500.0000 mL | INTRAVENOUS | Status: DC

## 2014-04-02 NOTE — Patient Instructions (Addendum)
YOU HAD AN ENDOSCOPIC PROCEDURE TODAY AT THE [facility]: Refer to the procedure report that was given to you for any specific questions about what was found during the examination.  If the procedure report does not answer your questions, please call your gastroenterologist to clarify.  If you requested that your care partner not be given the details of your procedure findings, then the procedure report has been included in a sealed envelope for you to review at your convenience later.  YOU SHOULD EXPECT: Some feelings of bloating in the abdomen. Passage of more gas than usual.  Walking can help get rid of the air that was put into your GI tract during the procedure and reduce the bloating. If you had a lower endoscopy (such as a colonoscopy or flexible sigmoidoscopy) you may notice spotting of blood in your stool or on the toilet paper. If you underwent a bowel prep for your procedure, then you may not have a normal bowel movement for a few days.  DIET: Your first meal following the procedure should be a light meal and then it is ok to progress to your normal diet.  A half-sandwich or bowl of soup is an example of a good first meal.  Heavy or fried foods are harder to digest and may make you feel nauseous or bloated.  Likewise meals heavy in dairy and vegetables can cause extra gas to form and this can also increase the bloating.  Drink plenty of fluids but you should avoid alcoholic beverages for 24 hours.  ACTIVITY: Your care partner should take you home directly after the procedure.  You should plan to take it easy, moving slowly for the rest of the day.  You can resume normal activity the day after the procedure however you should NOT DRIVE or use heavy machinery for 24 hours (because of the sedation medicines used during the test).    SYMPTOMS TO REPORT IMMEDIATELY: A gastroenterologist can be reached at any hour.  During normal business hours, 8:30 AM to 5:00 PM Monday through Friday,  call [phone].  After hours and on weekends, please call the GI answering service at [phone] who will take a message and have the physician on call contact you.   Following lower endoscopy (colonoscopy or flexible sigmoidoscopy):  Excessive amounts of blood in the stool  Significant tenderness or worsening of abdominal pains  Swelling of the abdomen that is new, acute  Fever of 100F or higher  Following upper endoscopy (EGD)  Vomiting of blood or coffee ground material  New chest pain or pain under the shoulder blades  Painful or persistently difficult swallowing  New shortness of breath  Fever of 100F or higher  Black, tarry-looking stools  FOLLOW UP: If any biopsies were taken you will be contacted by phone or by letter within the next 1-3 weeks.  Call your gastroenterologist if you have not heard about the biopsies in 3 weeks.  Our staff will call the home number listed on your records the next business day following your procedure to check on you and address any questions or concerns that you may have at that time regarding the information given to you following your procedure. This is a courtesy call and so if there is no answer at the home number and we have not heard from you through the emergency physician on call, we will assume that you have returned to your regular daily activities without incident.  SIGNATURES/CONFIDENTIALITY: You and/or your care   partner have signed paperwork which will be entered into your electronic medical record.  These signatures attest to the fact that that the information above on your After Visit Summary has been reviewed and is understood.  Full responsibility of the confidentiality of this discharge information lies with you and/or your care-partner.  Await pathology  Please read over handouts about gastritis, hiatal hernias, high fiber diets and polyps  Your prescription was sent to your [facility]- increase to twice daily.  I  tablet 30 minutes before breakfast and 1 tablet 30 minutes before supper  You might note some irritation in your nose or some drainage.  This may cause feelings of congestion.  This is from the oxygen, which can be irritating.  There is no need for concern, this should clear up in a day or so.

## 2014-04-02 NOTE — Op Note (Addendum)
[place]  [address]. [place], [address]   COLONOSCOPY PROCEDURE REPORT  PATIENT: [name], [name]  MR#: [id number] BIRTHDATE: Dec 31, 1955 , 35  yrs. old GENDER: female ENDOSCOPIST: Lafayette Dragon, MD REFERRED UU:VOZDGU Advani, MD PROCEDURE DATE:  04/02/2014 PROCEDURE:   Colonoscopy with cold biopsy polypectomy First Screening Colonoscopy - Avg.  risk and is 50 yrs.  old or older - No.  Prior Negative Screening - Now for repeat screening. 10 or more years since last screening  History of Adenoma - Now for follow-up colonoscopy & has been > or = to 3 yrs.  N/A  Polyps Removed Today? Yes. ASA CLASS:   Class II INDICATIONS:average risk patient for colon cancer and prior colonoscopy in 1994 and again in March 2000 showed nonspecific proctitis. MEDICATIONS: Monitored anesthesia care and Propofol 400 mg IV  DESCRIPTION OF PROCEDURE:   After the risks benefits and alternatives of the procedure were thoroughly explained, informed consent was obtained.  The digital rectal exam revealed no abnormalities of the rectum.   The LB [id number]  endoscope was introduced through the anus and advanced to the cecum, which was identified by the ileocecal valve. No adverse events experienced.   The quality of the prep was Moviprep fair  The instrument was then slowly withdrawn as the colon was fully examined.    COLON FINDINGS: A sessile polyp measuring 3 mm in size was found at the cecum.  A polypectomy was performed with cold forceps.  The resection was complete, the polyp tissue was completely retrieved and sent to histology.  Retroflexed views revealed no abnormalities. The time to cecum=11 minutes 04 seconds.  Withdrawal time=10 minutes 500 seconds.  The scope was withdrawn and the procedure completed. COMPLICATIONS: There were no immediate complications.  ENDOSCOPIC IMPRESSION  1.:diminutive polyp of the cecum status post cold biopsy polypectomy 2. Suboptimal  prep   RECOMMENDATIONS: 1.  Await biopsy results 2.  High fiber diet Recall colonoscopy pending path report Considered 2 day prep for future colonoscopy  eSigned:  Lafayette Dragon, MD 04/02/2014 11:48 AM Revised: 04/02/2014 11:48 AM  cc:

## 2014-04-02 NOTE — Progress Notes (Signed)
Called to room to assist during endoscopic procedure.  Patient ID and intended procedure confirmed with present staff. Received instructions for my participation in the procedure from the performing physician.  

## 2014-04-02 NOTE — Op Note (Signed)
[place]  [address]. [place], [address]   ENDOSCOPY PROCEDURE REPORT  PATIENT: [name], [name]  MR#: [id number] BIRTHDATE: 1955-10-19 , 59  yrs. old GENDER: female ENDOSCOPIST: Lafayette Dragon, MD REFERRED BY:  Lorayne Marek, MD PROCEDURE DATE:  04/02/2014 PROCEDURE:  EGD w/ biopsy ASA CLASS:     Class II INDICATIONS:  heartburn and last endoscopy in November 2006 and prior to that in 1998 showed mild gastritis negative for H. pylori. MEDICATIONS: Monitored anesthesia care and Propofol 100 mg IV TOPICAL ANESTHETIC: none  DESCRIPTION OF PROCEDURE: After the risks benefits and alternatives of the procedure were thoroughly explained, informed consent was obtained.  The LB [id number] endoscope was introduced through the mouth and advanced to the second portion of the duodenum , Without limitations.  The instrument was slowly withdrawn as the mucosa was fully examined.    Esophagus: terminal, mid and distal esophageal mucosa was normal. There was no stricture or esophagitis. Z line was regular  Stomach: there was a 3 cm partially reducible hiatal hernia extending from 32-35 cm from the incisors. There were no Cameron erosions. Gastric body and gastric antrum had erythema but no erosions. Changes were consistent with gastritis. Biopsies were obtained to rule out H. pylori. Gastric outlet was normal. Retroflexion of the endoscope revealed normal fundus and cardia area there was a small AV malformation in the gastric antrum which showed no stigmata of bleeding  Duodenum: duodenal bulb and descending duodenum was normal[ The scope was then withdrawn from the patient and the procedure completed.  COMPLICATIONS: There were no immediate complications.  ENDOSCOPIC IMPRESSION: 1. 3 cm hiatal hernia without stricture or erosions 2. 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 Nonspecific antral gastritis. Biopsies to rule out H. pylori   RECOMMENDATIONS: 1.   Await pathology results 2.  Antireflux regimen Increase PPI to twice a day  REPEAT EXAM: for EGD pending biopsy results.  eSigned:  Lafayette Dragon, MD 04/02/2014 11:41 AM    CC:  PATIENT NAME:  Lurlean, Kernen MR#: [id number]

## 2014-04-02 NOTE — Progress Notes (Signed)
Stable to RR 

## 2014-04-03 ENCOUNTER — Telehealth: Payer: Self-pay

## 2014-04-03 NOTE — Telephone Encounter (Signed)
  Follow up Call-  Call back number 04/02/2014  Post procedure Call Back phone  # [phone]  Permission to leave phone message Yes     Patient questions:  Do you have a fever, pain , or abdominal swelling? No. Pain Score  0 *  Have you tolerated food without any problems? Yes.    Have you been able to return to your normal activities? Yes.    Do you have any questions about your discharge instructions: Diet   No. Medications  No. Follow up visit  No.  Do you have questions or concerns about your Care? No.  Actions: * If pain score is 4 or above: No action needed, pain <4.

## 2014-04-09 ENCOUNTER — Encounter: Payer: Self-pay | Admitting: Internal Medicine

## 2014-04-10 ENCOUNTER — Telehealth: Payer: Self-pay | Admitting: *Deleted

## 2014-04-10 NOTE — Telephone Encounter (Signed)
Received a Rx from [employer] requesting a Prior Authorization for esomeprazole ([place]), 40 mg, capsule. Called to initiate a PA [phone]) and they said that the patient did not need a PA, they could request Nexium, the brand name. I called [facility] to let them know the patient can have Nexium. [facility] said the patient's Rx will be ready tomorrow (04/11/14) after 4:00 pm. I called the patient to let them know when their Rx would be ready. Patient stated they understood.

## 2014-04-12 ENCOUNTER — Ambulatory Visit (HOSPITAL_COMMUNITY)
Admission: RE | Admit: 2014-04-12 | Discharge: 2014-04-12 | Disposition: A | Discharge status: Home / Self Care | Payer: Medicaid Other | Source: Ambulatory Visit | Attending: Internal Medicine | Admitting: Internal Medicine

## 2014-04-12 DIAGNOSIS — Z1231 Encounter for screening mammogram for malignant neoplasm of breast: Secondary | ICD-10-CM | POA: Insufficient documentation

## 2014-04-15 ENCOUNTER — Telehealth: Payer: Self-pay | Admitting: Internal Medicine

## 2014-04-16 NOTE — Telephone Encounter (Signed)
Called patient back on 04/16/14 at 1:15 pm and left message for them to call me back.

## 2014-04-17 NOTE — Telephone Encounter (Signed)
Talked to patient at 11:30 am on 04/17/14 to let them know their Rx for Nexium was at [facility], [address] with three refills and the cost would be $3 for them. Patient stated they understood.

## 2014-04-22 ENCOUNTER — Other Ambulatory Visit: Payer: Self-pay | Admitting: Internal Medicine

## 2014-04-22 ENCOUNTER — Telehealth: Payer: Self-pay

## 2014-04-22 NOTE — Telephone Encounter (Signed)
Returned patient phone call Patient not available Left message on  Voice mail to return our call

## 2014-05-08 ENCOUNTER — Telehealth: Payer: Self-pay

## 2014-05-08 NOTE — Telephone Encounter (Signed)
Returned patient call Patient is going out of town and pharmacy had left her a message to speak with me Not sure what she needed to speak to me about If i can find out from pharmacy i will call her back

## 2014-07-18 ENCOUNTER — Ambulatory Visit: Payer: Medicaid Other | Attending: Internal Medicine

## 2014-07-24 ENCOUNTER — Other Ambulatory Visit: Payer: Self-pay

## 2014-07-24 MED ORDER — BECLOMETHASONE DIPROPIONATE 80 MCG/ACT IN AERS: 2.0000 | INHALATION_SPRAY | Freq: Two times a day (BID) | RESPIRATORY_TRACT | Status: DC

## 2014-07-24 MED ORDER — ALBUTEROL SULFATE HFA 108 (90 BASE) MCG/ACT IN AERS: 1.0000 | INHALATION_SPRAY | Freq: Every day | RESPIRATORY_TRACT | Status: DC | PRN

## 2014-08-22 ENCOUNTER — Telehealth: Payer: Self-pay | Admitting: Internal Medicine

## 2014-08-22 NOTE — Telephone Encounter (Signed)
Patient came into office requesting medication refill on amLODipine (NORVASC) 5 MG tablet. Patient states she is completely out of medication and can notice swelling, please f/u with patient

## 2014-08-30 MED ORDER — AMLODIPINE BESYLATE 5 MG PO TABS: 5.0000 mg | ORAL_TABLET | Freq: Every day | ORAL | Status: DC

## 2014-09-25 ENCOUNTER — Other Ambulatory Visit: Payer: Self-pay | Admitting: Internal Medicine

## 2014-09-25 MED ORDER — DIVALPROEX SODIUM ER 250 MG PO TB24: 1000.0000 mg | ORAL_TABLET | Freq: Every day | ORAL | Status: DC

## 2014-10-07 ENCOUNTER — Ambulatory Visit: Payer: Self-pay | Attending: Internal Medicine

## 2014-11-21 ENCOUNTER — Encounter: Payer: Self-pay | Admitting: Family Medicine

## 2014-11-21 ENCOUNTER — Ambulatory Visit: Payer: Self-pay | Attending: Family Medicine | Admitting: Family Medicine

## 2014-11-21 VITALS — BP 125/78 | HR 67 | Temp 98.2°F | Resp 16 | Ht 61.0 in | Wt 165.0 lb

## 2014-11-21 DIAGNOSIS — G43909 Migraine, unspecified, not intractable, without status migrainosus: Secondary | ICD-10-CM | POA: Insufficient documentation

## 2014-11-21 DIAGNOSIS — Z79899 Other long term (current) drug therapy: Secondary | ICD-10-CM | POA: Insufficient documentation

## 2014-11-21 DIAGNOSIS — J310 Chronic rhinitis: Secondary | ICD-10-CM

## 2014-11-21 DIAGNOSIS — Z1159 Encounter for screening for other viral diseases: Secondary | ICD-10-CM

## 2014-11-21 DIAGNOSIS — E039 Hypothyroidism, unspecified: Secondary | ICD-10-CM

## 2014-11-21 DIAGNOSIS — Z0000 Encounter for general adult medical examination without abnormal findings: Secondary | ICD-10-CM

## 2014-11-21 DIAGNOSIS — K219 Gastro-esophageal reflux disease without esophagitis: Secondary | ICD-10-CM

## 2014-11-21 DIAGNOSIS — Z114 Encounter for screening for human immunodeficiency virus [HIV]: Secondary | ICD-10-CM

## 2014-11-21 DIAGNOSIS — I10 Essential (primary) hypertension: Secondary | ICD-10-CM

## 2014-11-21 DIAGNOSIS — M170 Bilateral primary osteoarthritis of knee: Secondary | ICD-10-CM

## 2014-11-21 DIAGNOSIS — G43809 Other migraine, not intractable, without status migrainosus: Secondary | ICD-10-CM

## 2014-11-21 DIAGNOSIS — J45909 Unspecified asthma, uncomplicated: Secondary | ICD-10-CM

## 2014-11-21 DIAGNOSIS — F319 Bipolar disorder, unspecified: Secondary | ICD-10-CM | POA: Insufficient documentation

## 2014-11-21 LAB — COMPLETE METABOLIC PANEL WITH GFR
ALT: 9 U/L (ref 6–29)
AST: 17 U/L (ref 10–35)
Albumin: 3.9 g/dL (ref 3.6–5.1)
Alkaline Phosphatase: 93 U/L (ref 33–130)
BILIRUBIN TOTAL: 0.3 mg/dL (ref 0.2–1.2)
BUN: 16 mg/dL (ref 7–25)
CALCIUM: 9.3 mg/dL (ref 8.6–10.4)
CHLORIDE: 100 mmol/L (ref 98–110)
CO2: 27 mmol/L (ref 20–31)
CREATININE: 1 mg/dL (ref 0.50–1.05)
GFR, EST AFRICAN AMERICAN: 72 mL/min (ref >=60)
GFR, Est Non African American: 62 mL/min (ref >=60)
Glucose, Bld: 85 mg/dL (ref 65–99)
Potassium: 4.1 mmol/L (ref 3.5–5.3)
Sodium: 140 mmol/L (ref 135–146)
TOTAL PROTEIN: 6.7 g/dL (ref 6.1–8.1)

## 2014-11-21 LAB — TSH: TSH: 4.511 u[IU]/mL — ABNORMAL HIGH (ref 0.350–4.500)

## 2014-11-21 MED ORDER — ALBUTEROL SULFATE HFA 108 (90 BASE) MCG/ACT IN AERS: 1.0000 | INHALATION_SPRAY | Freq: Every day | RESPIRATORY_TRACT | Status: DC | PRN

## 2014-11-21 MED ORDER — RIZATRIPTAN BENZOATE 5 MG PO TBDP: 5.0000 mg | ORAL_TABLET | Freq: Every day | ORAL | Status: DC | PRN

## 2014-11-21 MED ORDER — BECLOMETHASONE DIPROPIONATE 80 MCG/ACT IN AERS: 2.0000 | INHALATION_SPRAY | Freq: Two times a day (BID) | RESPIRATORY_TRACT | Status: DC

## 2014-11-21 MED ORDER — FLUTICASONE PROPIONATE 50 MCG/ACT NA SUSP: 2.0000 | Freq: Every day | NASAL | Status: DC

## 2014-11-21 MED ORDER — RANITIDINE HCL 150 MG PO TABS
150.0000 mg | ORAL_TABLET | Freq: Every day | ORAL | Status: DC
Start: 2014-11-21 — End: 2015-02-04

## 2014-11-21 MED ORDER — CETIRIZINE HCL 10 MG PO TABS: 10.0000 mg | ORAL_TABLET | Freq: Every day | ORAL | Status: DC

## 2014-11-21 MED ORDER — LOSARTAN POTASSIUM 50 MG PO TABS: 50.0000 mg | ORAL_TABLET | Freq: Every day | ORAL | Status: DC

## 2014-11-21 MED ORDER — NAPROXEN SODIUM 220 MG PO TABS: 880.0000 mg | ORAL_TABLET | Freq: Two times a day (BID) | ORAL | Status: DC | PRN

## 2014-11-21 MED ORDER — AMLODIPINE BESYLATE 5 MG PO TABS: 5.0000 mg | ORAL_TABLET | Freq: Every day | ORAL | Status: DC

## 2014-11-21 NOTE — Patient Instructions (Addendum)
Diagnoses and all orders for this visit:  Screening for HIV (human immunodeficiency virus) -     HIV antibody (with reflex)  Need for hepatitis C screening test -     Hepatitis C antibody, reflex  Essential hypertension -     COMPLETE METABOLIC PANEL WITH GFR -     losartan (COZAAR) 50 MG tablet; Take 1 tablet (50 mg total) by mouth daily.  Hypothyroidism, unspecified hypothyroidism type -     TSH  Gastroesophageal reflux disease, esophagitis presence not specified -     ranitidine (ZANTAC) 150 MG tablet; Take 1 tablet (150 mg total) by mouth at bedtime.  Asthma, unspecified asthma severity, uncomplicated -     cetirizine (ZYRTEC) 10 MG tablet; Take 1 tablet (10 mg total) by mouth daily. -     beclomethasone (QVAR) 80 MCG/ACT inhaler; Inhale 2 puffs into the lungs 2 (two) times daily. -     albuterol (PROVENTIL HFA;VENTOLIN HFA) 108 (90 BASE) MCG/ACT inhaler; Inhale 1-2 puffs into the lungs daily as needed for wheezing or shortness of breath.  Primary osteoarthritis of both knees -     naproxen sodium (ANAPROX) 220 MG tablet; Take 4 tablets (880 mg total) by mouth 2 (two) times daily as needed (headache).  Other migraine without status migrainosus, not intractable -     rizatriptan (MAXALT-MLT) 5 MG disintegrating tablet; Take 1 tablet (5 mg total) by mouth daily as needed for migraine. May repeat in 2 hours if needed -     naproxen sodium (ANAPROX) 220 MG tablet; Take 4 tablets (880 mg total) by mouth 2 (two) times daily as needed (headache).  RHINITIS, CHRONIC -     fluticasone (FLONASE) 50 MCG/ACT nasal spray; Place 2 sprays into both nostrils daily.   F/u in 4 weeks with pharmacy team BP check and repeat Cr since changing for norvasc to losartan  F/u in 4 months with me for HTN, OA, GERD and migraines   Dr. Adrian Blackwater

## 2014-11-21 NOTE — Progress Notes (Signed)
F/U TSH  Medicine refills No pain today No hx tobacco

## 2014-11-21 NOTE — Progress Notes (Signed)
Patient ID: [name], female   DOB: [birth year], 59 y.o.   MRN: [id number]   Subjective:  Patient ID: [name], female    DOB: 21-Jan-1956  Age: 59 y.o. MRN: [id number]  CC: Follow-up and Hypothyroidism   HPI HERA CELAYA presents for   1. Hypothyroidism: dx many years ago in setting of 30 # weight gain and dry skin. Previously on synthroid. None now. Gaining weight. She is not exercising currently.   2. HTN: takes norvasc 5 mg. Gets swelling in feet and ankles. Cough. No CP or SOB. Rare migraines.  3. OA: in knees. Limits mobility.  Naproxen and water aerobics helps.   4. GERD: chronic. Took PPI amd H2 blocker. Has regular heartburn. tums did not help. Concerned about longterm antacid use.   5. Taking premarin: prescribed by her gynecologist for vaginal atrophy.   6. Bipolar: followed and treated by mental health. Drug induce tremor treated with primidone.   Outpatient Prescriptions Prior to Visit  Medication Sig Dispense Refill  . amitriptyline (ELAVIL) 25 MG tablet Take 25 mg by mouth at bedtime.    Marland Kitchen amLODipine (NORVASC) 5 MG tablet Take 1 tablet (5 mg total) by mouth daily. 30 tablet 1  . divalproex (DEPAKOTE ER) 250 MG 24 hr tablet Take 4 tablets (1,000 mg total) by mouth at bedtime. 120 tablet 3  . gabapentin (NEURONTIN) 300 MG capsule Take 300 mg by mouth 3 (three) times daily.    . hydrocortisone cream 1 % Apply 1 application topically 2 (two) times daily.    . hydrOXYzine (ATARAX/VISTARIL) 25 MG tablet Take 25 mg by mouth at bedtime.    Marland Kitchen loperamide (IMODIUM A-D) 2 MG tablet Take 2 mg by mouth 4 (four) times daily as needed for diarrhea or loose stools.    . Pediatric Multivit-Minerals-C (CHILDRENS MULTIVITAMIN) 60 MG CHEW Chew 1 tablet by mouth daily.    . primidone (MYSOLINE) 50 MG tablet Take 50 mg by mouth at bedtime.    . sertraline (ZOLOFT) 100 MG tablet Take 2 tablets (200 mg total) by mouth daily. 14 tablet 0  . albuterol (PROVENTIL HFA;VENTOLIN HFA)  108 (90 BASE) MCG/ACT inhaler Inhale 1-2 puffs into the lungs daily as needed for wheezing or shortness of breath. (Patient not taking: Reported on 11/21/2014) 3 Inhaler 3  . anti-nausea (EMETROL) solution Take 30 mLs by mouth every 15 (fifteen) minutes as needed for nausea or vomiting.    . beclomethasone (QVAR) 80 MCG/ACT inhaler Inhale 2 puffs into the lungs 2 (two) times daily. (Patient not taking: Reported on 11/21/2014) 3 Inhaler 3  . cetirizine (ZYRTEC) 10 MG tablet Take 10 mg by mouth daily.    Marland Kitchen esomeprazole (NEXIUM) 40 MG capsule Take 1 tablet 30 minutes before breakfast and 1 tablet 30 minutes before supper (Patient not taking: Reported on 11/21/2014) 60 capsule 3  . fluticasone (FLONASE) 50 MCG/ACT nasal spray Place 2 sprays into both nostrils daily. (Patient not taking: Reported on 11/21/2014) 16 g 6  . lithium carbonate 300 MG capsule Take 300 mg by mouth daily.    . naproxen sodium (ANAPROX) 220 MG tablet Take 880 mg by mouth 2 (two) times daily as needed (headache).    . ranitidine (ZANTAC) 150 MG tablet Take 1 tablet (150 mg total) by mouth at bedtime. (Patient not taking: Reported on 11/21/2014) 30 tablet 3  . rizatriptan (MAXALT-MLT) 5 MG disintegrating tablet Take 5 mg by mouth daily as needed for migraine. May repeat in 2  hours if needed     No facility-administered medications prior to visit.    ROS Review of Systems  Constitutional: Negative for fever and chills.  Eyes: Negative for visual disturbance.  Respiratory: Positive for cough. Negative for shortness of breath.   Cardiovascular: Positive for leg swelling (swelling in feet and ankles ). Negative for chest pain.  Gastrointestinal: Negative for abdominal pain and blood in stool.  Musculoskeletal: Positive for arthralgias. Negative for back pain.  Skin: Negative for rash.  Allergic/Immunologic: Negative for immunocompromised state.  Neurological: Positive for tremors.  Hematological: Negative for adenopathy. Does  not bruise/bleed easily.  Psychiatric/Behavioral: Negative for suicidal ideas and dysphoric mood.  GAD-7: score of 9. 1-2,5,6,. 3-2,3,4.   Objective:  Resp 16  Ht 5\' 1"  (1.549 m)  Wt 165 lb (74.844 kg)  BMI 31.19 kg/m2  BP/Weight 11/21/2014 3/57/0177 10/11/9028  Systolic BP - 092 -  Diastolic BP - 49 -  Wt. (Lbs) 165 154 154  BMI 31.19 30.08 29.57   Physical Exam  Constitutional: She is oriented to person, place, and time. She appears well-developed and well-nourished. No distress.  HENT:  Head: Normocephalic and atraumatic.  Cardiovascular: Normal rate, regular rhythm, normal heart sounds and intact distal pulses.   Pulmonary/Chest: Effort normal and breath sounds normal.  Musculoskeletal: She exhibits no edema.  Neurological: She is alert and oriented to person, place, and time.  Skin: Skin is warm and dry. No rash noted.  Psychiatric: She has a normal mood and affect.     Assessment & Plan:   Problem List Items Addressed This Visit    Asthma   Relevant Medications   cetirizine (ZYRTEC) 10 MG tablet   beclomethasone (QVAR) 80 MCG/ACT inhaler   albuterol (PROVENTIL HFA;VENTOLIN HFA) 108 (90 BASE) MCG/ACT inhaler   Essential hypertension   Relevant Medications   losartan (COZAAR) 50 MG tablet   Other Relevant Orders   COMPLETE METABOLIC PANEL WITH GFR   GERD   Relevant Medications   ranitidine (ZANTAC) 150 MG tablet   Hypothyroidism   Relevant Orders   TSH   Migraines   Relevant Medications   rizatriptan (MAXALT-MLT) 5 MG disintegrating tablet   losartan (COZAAR) 50 MG tablet   naproxen sodium (ANAPROX) 220 MG tablet   Osteoarthritis   Relevant Medications   naproxen sodium (ANAPROX) 220 MG tablet   RHINITIS, CHRONIC   Relevant Medications   fluticasone (FLONASE) 50 MCG/ACT nasal spray    Other Visit Diagnoses    Screening for HIV (human immunodeficiency virus)    -  Primary    Relevant Orders    HIV antibody (with reflex)    Need for hepatitis C  screening test        Relevant Orders    Hepatitis C antibody, reflex    Healthcare maintenance        Relevant Orders    Flu Vaccine QUAD 36+ mos IM (Completed)       No orders of the defined types were placed in this encounter.    Follow-up: No Follow-up on file.   Boykin Nearing MD

## 2014-11-21 NOTE — Addendum Note (Signed)
Addended by: Boykin Nearing on: 11/21/2014 12:12 PM   Modules accepted: Orders, Medications

## 2014-11-22 LAB — HIV ANTIBODY (ROUTINE TESTING W REFLEX): HIV: NONREACTIVE

## 2014-11-22 LAB — HEPATITIS C ANTIBODY: HCV AB: NEGATIVE

## 2014-11-25 NOTE — Addendum Note (Signed)
Addended by: Boykin Nearing on: 11/25/2014 12:32 PM   Modules accepted: Orders

## 2014-11-25 NOTE — Assessment & Plan Note (Signed)
A: elevated TSH with possible return of hypothyroidism/subclinical hypothyroidism P: F/u T3 and T4 Low dose synthroid if either low

## 2014-12-06 ENCOUNTER — Ambulatory Visit: Payer: Self-pay | Attending: Family Medicine | Admitting: Family Medicine

## 2014-12-06 ENCOUNTER — Encounter: Payer: Self-pay | Admitting: Family Medicine

## 2014-12-06 VITALS — BP 125/76 | HR 84 | Temp 98.7°F | Resp 18 | Ht 60.0 in | Wt 166.0 lb

## 2014-12-06 DIAGNOSIS — M25562 Pain in left knee: Secondary | ICD-10-CM

## 2014-12-06 DIAGNOSIS — M25561 Pain in right knee: Secondary | ICD-10-CM | POA: Insufficient documentation

## 2014-12-06 DIAGNOSIS — G43809 Other migraine, not intractable, without status migrainosus: Secondary | ICD-10-CM

## 2014-12-06 DIAGNOSIS — I10 Essential (primary) hypertension: Secondary | ICD-10-CM

## 2014-12-06 DIAGNOSIS — Z0000 Encounter for general adult medical examination without abnormal findings: Secondary | ICD-10-CM

## 2014-12-06 DIAGNOSIS — N941 Unspecified dyspareunia: Secondary | ICD-10-CM

## 2014-12-06 MED ORDER — ESTRADIOL 0.1 MG/GM VA CREA: 1.0000 | TOPICAL_CREAM | Freq: Every day | VAGINAL | Status: DC

## 2014-12-06 MED ORDER — RIZATRIPTAN BENZOATE 10 MG PO TBDP: 10.0000 mg | ORAL_TABLET | Freq: Every day | ORAL | Status: DC | PRN

## 2014-12-06 MED ORDER — ESTROGENS, CONJUGATED 0.625 MG/GM VA CREA: 1.0000 | TOPICAL_CREAM | Freq: Every day | VAGINAL | Status: DC

## 2014-12-06 NOTE — Progress Notes (Signed)
Patient complains of pain in the back of knees,described as tender and tight, patient noticed swelling in knees, ankles and legs. Pain is intermittent. Patient noticed swelling a week and a half ago. Walking causes more pain. Resting and elevation gives minimum relief.   Patient misplaced Premarin requesting a new prescription. Patient states the cream was helping.  Patient requesting rizatriptan as 10 mg to start.

## 2014-12-06 NOTE — Addendum Note (Signed)
Addended by: Boykin Nearing on: 12/06/2014 05:15 PM   Modules accepted: Orders, Medications

## 2014-12-06 NOTE — Assessment & Plan Note (Signed)
A; swelling on 5 mg of norvasc. nomotensive P: D/c norvasc Close f/u for BP check

## 2014-12-06 NOTE — Patient Instructions (Addendum)
Dondra was seen today for leg pain.  Diagnoses and all orders for this visit:  Other migraine without status migrainosus, not intractable -     rizatriptan (MAXALT-MLT) 10 MG disintegrating tablet; Take 1 tablet (10 mg total) by mouth daily as needed for migraine. May repeat in 2 hours if needed  Dyspareunia, female -     conjugated estrogens (PREMARIN) vaginal cream; Place 1 Applicatorful vaginally at bedtime.  Bilateral knee pain -     Ambulatory referral to Orthopedic Surgery  Healthcare maintenance -     Ambulatory referral to Dentistry -     Ambulatory referral to Optometry   Swelling in legs and hands  Please stop norvasc   Referral placed to Dr. Durward Fortes for your knees   Unfortunately, due to your previous allergies I am unable to prescribe pain medicine.     F/u in 3 weeks for BP check with pharmacist  F/u in 3 months with me   Dr. Adrian Blackwater

## 2014-12-06 NOTE — Progress Notes (Signed)
Patient ID: [name], female   DOB: 1955/07/04, 59 y.o.   MRN: [id number]   Subjective:  Patient ID: [name], female    DOB: 11-20-1955  Age: 59 y.o. MRN: [id number]  CC: Leg Pain   HPI [name] presents for    1. Knee pain: has chronic knee pain. Previously treated by ortho. S/p steroid and synvisc injections. R knee is worse than L.  2. Swelling: in legs, hands and feet. Improved over the past 3 days. On norvasc. No CP or SOB.   3. Lost Rx: lost premarin cream which she take for dyspareunia. Prescribed by her gynecologist.   4. Increase raxalt?: would like 10 mg instead of 5 mg prn migraines.  5. Referral: requesting dental on optometry. No oral pain. Last eye exam > 1 year ago. Last dental exam 1 year ago.   Social History  Substance Use Topics  . Smoking status: Never Smoker   . Smokeless tobacco: Never Used  . Alcohol Use: Yes     Comment: rarely      Outpatient Prescriptions Prior to Visit  Medication Sig Dispense Refill  . albuterol (PROVENTIL HFA;VENTOLIN HFA) 108 (90 BASE) MCG/ACT inhaler Inhale 1-2 puffs into the lungs daily as needed for wheezing or shortness of breath. 3 Inhaler 3  . amitriptyline (ELAVIL) 25 MG tablet Take 25 mg by mouth at bedtime.    Marland Kitchen amLODipine (NORVASC) 5 MG tablet Take 1 tablet (5 mg total) by mouth daily. 30 tablet 1  . anti-nausea (EMETROL) solution Take 30 mLs by mouth every 15 (fifteen) minutes as needed for nausea or vomiting.    . beclomethasone (QVAR) 80 MCG/ACT inhaler Inhale 2 puffs into the lungs 2 (two) times daily. 3 Inhaler 5  . cetirizine (ZYRTEC) 10 MG tablet Take 1 tablet (10 mg total) by mouth daily. 30 tablet 5  . divalproex (DEPAKOTE ER) 250 MG 24 hr tablet Take 4 tablets (1,000 mg total) by mouth at bedtime. 120 tablet 3  . gabapentin (NEURONTIN) 300 MG capsule Take 300 mg by mouth 3 (three) times daily.    . hydrocortisone cream 1 % Apply 1 application topically 2 (two) times daily.    . hydrOXYzine  (ATARAX/VISTARIL) 25 MG tablet Take 25 mg by mouth at bedtime.    Marland Kitchen lithium carbonate 300 MG capsule Take 300 mg by mouth daily.    Marland Kitchen loperamide (IMODIUM A-D) 2 MG tablet Take 2 mg by mouth 4 (four) times daily as needed for diarrhea or loose stools.    . naproxen sodium (ANAPROX) 220 MG tablet Take 4 tablets (880 mg total) by mouth 2 (two) times daily as needed (headache). 60 tablet 5  . primidone (MYSOLINE) 50 MG tablet Take 50 mg by mouth at bedtime.    . ranitidine (ZANTAC) 150 MG tablet Take 1 tablet (150 mg total) by mouth at bedtime. 30 tablet 11  . rizatriptan (MAXALT-MLT) 5 MG disintegrating tablet Take 1 tablet (5 mg total) by mouth daily as needed for migraine. May repeat in 2 hours if needed 10 tablet 2  . sertraline (ZOLOFT) 100 MG tablet Take 2 tablets (200 mg total) by mouth daily. 14 tablet 0  . conjugated estrogens (PREMARIN) vaginal cream Place 1 Applicatorful vaginally at bedtime.    . fluticasone (FLONASE) 50 MCG/ACT nasal spray Place 2 sprays into both nostrils daily. 16 g 6  . Pediatric Multivit-Minerals-C (CHILDRENS MULTIVITAMIN) 60 MG CHEW Chew 1 tablet by mouth daily.  No facility-administered medications prior to visit.    ROS Review of Systems  Constitutional: Negative for fever and chills.  Eyes: Negative for visual disturbance.  Respiratory: Negative for shortness of breath.   Cardiovascular: Positive for leg swelling. Negative for chest pain.  Gastrointestinal: Negative for abdominal pain and blood in stool.  Musculoskeletal: Negative for back pain and arthralgias.  Skin: Negative for rash.  Allergic/Immunologic: Negative for immunocompromised state.  Hematological: Negative for adenopathy. Does not bruise/bleed easily.  Psychiatric/Behavioral: Negative for suicidal ideas and dysphoric mood.    Objective:  BP 125/76 mmHg  Pulse 84  Temp(Src) 98.7 F (37.1 C) (Oral)  Resp 18  Ht 5' (1.524 m)  Wt 166 lb (75.297 kg)  BMI 32.42 kg/m2  SpO2  97%  BP/Weight 12/06/2014 11/21/2014 09/25/5629  Systolic BP 497 026 378  Diastolic BP 76 78 49  Wt. (Lbs) 166 165 154  BMI 32.42 31.19 30.08   Physical Exam  Constitutional: She is oriented to person, place, and time. She appears well-developed and well-nourished. No distress.  HENT:  Head: Normocephalic and atraumatic.  Cardiovascular: Normal rate, regular rhythm, normal heart sounds and intact distal pulses.   Pulmonary/Chest: Effort normal and breath sounds normal.  Musculoskeletal: She exhibits edema (trace LE edema b/l ) and tenderness.  Neurological: She is alert and oriented to person, place, and time.  Skin: Skin is warm and dry. No rash noted.  Psychiatric: She has a normal mood and affect.    Assessment & Plan:   Problem List Items Addressed This Visit    Bilateral knee pain   Relevant Orders   Ambulatory referral to Orthopedic Surgery   Dyspareunia, female   Relevant Medications   conjugated estrogens (PREMARIN) vaginal cream   Essential hypertension    A; swelling on 5 mg of norvasc. nomotensive P: D/c norvasc Close f/u for BP check       Migraines - Primary   Relevant Medications   rizatriptan (MAXALT-MLT) 10 MG disintegrating tablet    Other Visit Diagnoses    Healthcare maintenance        Relevant Orders    Ambulatory referral to Dentistry    Ambulatory referral to Optometry    Visual acuity screening (Completed)       No orders of the defined types were placed in this encounter.    Follow-up: No Follow-up on file.   Boykin Nearing MD

## 2014-12-16 ENCOUNTER — Telehealth: Payer: Self-pay | Admitting: *Deleted

## 2014-12-16 NOTE — Telephone Encounter (Signed)
-----   Message from Boykin Nearing, MD sent at 11/25/2014 12:29 PM EDT ----- TSH elevated, hypothyroidism may have returned. Please come back for f/u Free T3 and T4 with plan to start synthroid low dose if either are low   Screening HIV and Hep C normal  BMP normal

## 2014-12-16 NOTE — Telephone Encounter (Signed)
Date of birth verified by pt  Lab results given to pt TSH elevated. HIV and Hep C negative BMP normal  F/U lab work for T3 and T4  Pt will call back to schedule lab appointment

## 2014-12-18 ENCOUNTER — Ambulatory Visit: Payer: Self-pay | Attending: Family Medicine

## 2014-12-18 ENCOUNTER — Telehealth: Payer: Self-pay | Admitting: Family Medicine

## 2014-12-18 DIAGNOSIS — E039 Hypothyroidism, unspecified: Secondary | ICD-10-CM

## 2014-12-18 LAB — T3, FREE: T3, Free: 2.4 pg/mL (ref 2.3–4.2)

## 2014-12-18 LAB — T4, FREE: FREE T4: 0.78 ng/dL — AB (ref 0.80–1.80)

## 2014-12-18 NOTE — Telephone Encounter (Signed)
10/28 Optometry  Sent Referral next month November or following months to [facility] .They will contact the patient to schedule an appointment with the specialist through the gccn card. Pt is aware it will take several months to get an appointment due to slots with the orange card.  10/28 Dental  Sent  Referral to [facility] ph. # [address], [place] [address] They will contact the patient to schedule an appointment I don't know how long it will take.

## 2014-12-18 NOTE — Telephone Encounter (Signed)
Patient was referred to optometry and dental and never received a call to schedule an appointment with them. Please follow up with pt. Thank you.

## 2014-12-19 ENCOUNTER — Telehealth: Payer: Self-pay | Admitting: Family Medicine

## 2014-12-19 NOTE — Telephone Encounter (Signed)
Patient was referred to the Orthopedic and receive an injection in her knees. Patient feels the injections were ineffective and is still experiencing pain. Patient would like to return to the Orthopedic. Patient needs to know if she needs another referral to seen the Orthopedic. Patient saw the Orthopedic on 11/3 Please follow up with patient

## 2014-12-20 ENCOUNTER — Other Ambulatory Visit: Payer: Self-pay | Admitting: Family Medicine

## 2014-12-20 DIAGNOSIS — E039 Hypothyroidism, unspecified: Secondary | ICD-10-CM

## 2014-12-20 MED ORDER — LEVOTHYROXINE SODIUM 50 MCG PO TABS: 50.0000 ug | ORAL_TABLET | Freq: Every day | ORAL | Status: DC

## 2014-12-20 NOTE — Assessment & Plan Note (Addendum)
A:  Free T4 is slightly low Free T3 is low normal  This is consistent with hypothyroidism . Patient is on lithium.   P: Starting back on low dose synthroid   50 mcg daily in AM before any medications or meals

## 2014-12-20 NOTE — Telephone Encounter (Signed)
I spoke to patient she is aware don't need another referral cause she is establish with [facility] [employer] [facility] . Thank You

## 2014-12-25 ENCOUNTER — Telehealth: Payer: Self-pay | Admitting: Family Medicine

## 2014-12-25 DIAGNOSIS — N3010 Interstitial cystitis (chronic) without hematuria: Secondary | ICD-10-CM

## 2014-12-25 NOTE — Telephone Encounter (Signed)
Patient called and requested blood work results, please f/u with pt. °

## 2014-12-26 ENCOUNTER — Ambulatory Visit: Payer: Self-pay | Attending: Family Medicine | Admitting: Pharmacist

## 2014-12-26 VITALS — BP 134/81 | HR 87

## 2014-12-26 DIAGNOSIS — I10 Essential (primary) hypertension: Secondary | ICD-10-CM | POA: Insufficient documentation

## 2014-12-26 DIAGNOSIS — M1711 Unilateral primary osteoarthritis, right knee: Secondary | ICD-10-CM | POA: Insufficient documentation

## 2014-12-26 NOTE — Progress Notes (Signed)
S:    Patient arrives in good spirits.    Presents to the clinic for hypertension evaluation.   Patient reports adherence with medications.  Current BP Medications include:  None   Antihypertensives tried in the past include: amlodipine (caused leg swelling)  Patient reported that she is in significant pain due to her knee right now. She has a hard time walking on it. This is due to osteoarthritis. She is worried that her blood pressure will be too high because of the pain that she is in.    O:   Last 3 Office BP readings: BP Readings from Last 3 Encounters:  12/26/14 134/81  12/06/14 125/76  11/21/14 125/78    BMET    Component Value Date/Time   NA 140 11/21/2014 0959   K 4.1 11/21/2014 0959   CL 100 11/21/2014 0959   CO2 27 11/21/2014 0959   GLUCOSE 85 11/21/2014 0959   BUN 16 11/21/2014 0959   CREATININE 1.00 11/21/2014 0959   CREATININE 0.82 09/14/2013 1227   CALCIUM 9.3 11/21/2014 0959   GFRNONAA 62 11/21/2014 0959   GFRNONAA 79* 09/14/2013 1227   GFRAA 72 11/21/2014 0959   GFRAA >90 09/14/2013 1227    A/P: History of hypertension currently controlled without medication and when the patient is in pain. No recommendations for any changes to medications. Continue to monitor blood pressure at future visits.   Results reviewed and written information provided.   Total time in face-to-face counseling 10 minutes.   F/U Clinic Visit with Dr. Adrian Blackwater as directed.

## 2014-12-26 NOTE — Patient Instructions (Signed)
Your blood pressure looks great today - especially since you are in pain!  No changes to any of your medications at this time.  Follow up with Dr. Adrian Blackwater as directed

## 2014-12-30 NOTE — Telephone Encounter (Signed)
Patient called to requested another referral to [facility]. Please f/u with pt.

## 2015-01-01 NOTE — Telephone Encounter (Signed)
Date of Birth verified by pt Low T4, T3. consistent with hypothyroidism Advised to start dose synthroid 50 mcg Pt verbalized understanding

## 2015-01-01 NOTE — Telephone Encounter (Signed)
-----   Message from Boykin Nearing, MD sent at 12/20/2014  9:20 AM EST ----- Free T4 is slightly low Free T3 is low normal  This is consistent with hypothyroidism  Starting back on low dose synthroid   50 mcg daily in AM before any medications or meals

## 2015-01-06 NOTE — Telephone Encounter (Signed)
Referral placed.

## 2015-01-07 ENCOUNTER — Telehealth: Payer: Self-pay | Admitting: Family Medicine

## 2015-01-07 NOTE — Telephone Encounter (Signed)
Patient was diagnosed with GERD years ago and believes she is experiencing severe GERD symptoms that have been increasing tremendously in pain and is wondering why she is prescribed 1 pill instead of 2 pills of Ranitidine. Please follow up with patient for advise. Thank you.

## 2015-01-09 ENCOUNTER — Other Ambulatory Visit: Payer: Self-pay | Admitting: Internal Medicine

## 2015-01-23 ENCOUNTER — Other Ambulatory Visit: Payer: Self-pay | Admitting: Internal Medicine

## 2015-01-23 DIAGNOSIS — R251 Tremor, unspecified: Secondary | ICD-10-CM

## 2015-01-29 ENCOUNTER — Telehealth: Payer: Self-pay | Admitting: Family Medicine

## 2015-01-29 DIAGNOSIS — K219 Gastro-esophageal reflux disease without esophagitis: Secondary | ICD-10-CM

## 2015-01-29 NOTE — Telephone Encounter (Signed)
Pt. Called requesting to speak to nurse the medication of ranitidine (ZANTAC) 150 MG tablet is not helping at all. Please f/u with pt.

## 2015-01-29 NOTE — Telephone Encounter (Signed)
Pt left voicemail, wanted to add that ranitidine (ZANTAC) 150 MG tablet dosage isn't enough. Pt says she thinks the dosage needs to be increased

## 2015-02-04 MED ORDER — RANITIDINE HCL 300 MG PO TABS: 300.0000 mg | ORAL_TABLET | Freq: Every day | ORAL | Status: DC

## 2015-02-04 NOTE — Telephone Encounter (Signed)
Zantac increase to 300 mg nightly

## 2015-02-07 NOTE — Telephone Encounter (Signed)
Please call patient  Zantac has been refilled Primidone refused, but neurology referral placed for evaluation and treatment of tremor

## 2015-02-13 NOTE — Telephone Encounter (Signed)
Pt notified Increased Zantac to 300mg  nightly

## 2015-02-14 MED FILL — ?HYDROXYZINE HCL 25 MG TAB: 25 | 26 days supply | Qty: 26 | Fill #6

## 2015-02-14 MED FILL — ?ESTRACE 0.01% CREAM: 0.1 | 24 days supply | Qty: 24 | Fill #1

## 2015-02-14 MED FILL — ?AMITRIPTYLINE HCL 25 MG TA: 25 | 30 days supply | Qty: 30 | Fill #6

## 2015-02-14 MED FILL — ?RIZATRIPTAN 10MG ODT: 10 MG | 26 days supply | Qty: 9 | Fill #2

## 2015-02-20 MED FILL — raNITIdine HCL 150 MG TABS: 150 | 30 days supply | Qty: 60 | Fill #0

## 2015-02-25 ENCOUNTER — Ambulatory Visit: Payer: Self-pay | Admitting: Neurology

## 2015-02-28 MED FILL — LEVOTHYROXINE 50 MCG TABLET: 50 | 30 days supply | Qty: 30 | Fill #2

## 2015-02-28 MED FILL — !QVAR 80 MCG ORAL INHALER: 80 MCG | 30 days supply | Qty: 1 | Fill #2

## 2015-03-03 ENCOUNTER — Other Ambulatory Visit: Payer: Self-pay | Admitting: *Deleted

## 2015-03-03 DIAGNOSIS — J45909 Unspecified asthma, uncomplicated: Secondary | ICD-10-CM

## 2015-03-03 MED ORDER — BECLOMETHASONE DIPROPIONATE 80 MCG/ACT IN AERS: 2.0000 | INHALATION_SPRAY | Freq: Two times a day (BID) | RESPIRATORY_TRACT | Status: DC

## 2015-03-04 ENCOUNTER — Encounter: Payer: Self-pay | Admitting: Neurology

## 2015-03-04 ENCOUNTER — Ambulatory Visit (INDEPENDENT_AMBULATORY_CARE_PROVIDER_SITE_OTHER): Payer: Self-pay | Admitting: Neurology

## 2015-03-04 VITALS — BP 128/80 | HR 86 | Ht 60.5 in | Wt 158.0 lb

## 2015-03-04 DIAGNOSIS — G5712 Meralgia paresthetica, left lower limb: Secondary | ICD-10-CM

## 2015-03-04 DIAGNOSIS — E039 Hypothyroidism, unspecified: Secondary | ICD-10-CM

## 2015-03-04 DIAGNOSIS — G251 Drug-induced tremor: Secondary | ICD-10-CM

## 2015-03-04 MED ORDER — PRIMIDONE 50 MG PO TABS: 50.0000 mg | ORAL_TABLET | ORAL | Status: DC | PRN

## 2015-03-04 MED FILL — PRIMIDONE 50 MG TABLET: 50 | 30 days supply | Qty: 30 | Fill #0

## 2015-03-04 NOTE — Progress Notes (Signed)
Subjective:   [name] was seen in consultation in the movement disorder clinic at the request of Minerva Ends, MD.  The evaluation is for tremor.  The patient is a 60 y.o. left handed female with a history of tremor.  Pt reports that tremor started many years ago; she recalls that intermittently throughout her life she would be tremulous when she was stressed or singing in crowds or doing pageants as a child.  However, since she has gotten older it has gotten worse and even if she is the "least bit anxious" she will be tremulous and it may last for days.  She states that some days she will be more tremulous more than others.  There is no known family hx of tremor (thinks that sister may but she doesn't know).  States that her paternal GM had PD.  She was admitted to psychiatry at the end of 2013-2014 to behavioral health and c/o tremor.  She was told that her tremor was due to stress/psychogenic according to the patient.  I cannot find notes about this but did find notes about tremor.  Affected by caffeine:  Unknown (rarely drinks caffeine) Affected by alcohol:  Rarely drinks Affected by stress:  Yes.   Affected by fatigue:  No., not unless very fatigued Spills soup if on spoon:  Yes.   (and has trouble eating peas) Spills glass of liquid if full:  Yes.  , if glass is too full Affects ADL's (tying shoes, brushing teeth, etc):  No., although will note tremor with zipping zippers and with measuring food when baking)  Current/Previously tried tremor medications: primidone (put on it by PA at [facility] per the patient - helped sometimes but not all the time - ran out of the medication and so is now off of it)  Current medications that may exacerbate tremor:  Lithium/VPA/geodon/albuterol  (albuterol will make her tremor so doesn't use it often; been on lithium since 1987; been on geodon for 3 months for hallucinations - visual and auditory)  Outside reports reviewed:  historical medical records, lab reports and referral letter/letters.  Allergies  Allergen Reactions  . Amlodipine Swelling    Peripheral edema  . Pseudoephedrine Other (See Comments)    I fly off the walls   . Risperidone And Related Other (See Comments)    Stomach upset, insomnia, drooling, tremors, "jerks," sensitivity to touch.   . Aspirin Other (See Comments)    REACTION: upset stomach  . Bupivacaine Hcl Hives  . Codeine Itching  . Etodolac Rash  . Hydrocodone-Acetaminophen Rash  . Indocin [Indomethacin] Other (See Comments)    Muscle spasms  . Ivp Dye [Iodinated Diagnostic Agents] Other (See Comments)    Flushed and Fever  . Pentazocine Lactate Itching  . Propoxyphene N-Acetaminophen Itching  . Propranolol Nausea Only  . Sulfonamide Derivatives Other (See Comments)    unknown  . Tramadol Hcl Other (See Comments)    unknown    Outpatient Encounter Prescriptions as of 03/04/2015  Medication Sig  . albuterol (PROVENTIL HFA;VENTOLIN HFA) 108 (90 BASE) MCG/ACT inhaler Inhale 1-2 puffs into the lungs daily as needed for wheezing or shortness of breath.  Marland Kitchen amitriptyline (ELAVIL) 25 MG tablet Take 25 mg by mouth at bedtime.  Marland Kitchen anti-nausea (EMETROL) solution Take 30 mLs by mouth every 15 (fifteen) minutes as needed for nausea or vomiting.  . beclomethasone (QVAR) 80 MCG/ACT inhaler Inhale 2 puffs into the lungs 2 (two) times daily.  Marland Kitchen  bismuth subsalicylate (PEPTO BISMOL) 262 MG chewable tablet Chew 524 mg by mouth as needed.  . cetirizine (ZYRTEC) 10 MG tablet Take 1 tablet (10 mg total) by mouth daily.  . chlorpheniramine (CHLOR-TRIMETON) 4 MG tablet Take 4 mg by mouth 2 (two) times daily as needed for allergies.  . clonazePAM (KLONOPIN) 0.5 MG tablet Take 0.5 mg by mouth at bedtime.  . divalproex (DEPAKOTE ER) 250 MG 24 hr tablet Take 4 tablets (1,000 mg total) by mouth at bedtime. (Patient taking differently: Take 500 mg by mouth at bedtime. )  . estradiol (ESTRACE) 0.1 MG/GM  vaginal cream Place 1 Applicatorful vaginally at bedtime.  . fluticasone (FLONASE) 50 MCG/ACT nasal spray Place 2 sprays into both nostrils daily.  Marland Kitchen gabapentin (NEURONTIN) 300 MG capsule Take 300 mg by mouth 3 (three) times daily.  . hydrocortisone cream 1 % Apply 1 application topically 2 (two) times daily.  . hydrOXYzine (ATARAX/VISTARIL) 25 MG tablet Take 25 mg by mouth at bedtime.  Marland Kitchen levothyroxine (SYNTHROID, LEVOTHROID) 50 MCG tablet Take 1 tablet (50 mcg total) by mouth daily.  Marland Kitchen lithium carbonate 300 MG capsule Take 300 mg by mouth daily.  Marland Kitchen loperamide (IMODIUM A-D) 2 MG tablet Take 2 mg by mouth 4 (four) times daily as needed for diarrhea or loose stools.  . naproxen sodium (ANAPROX) 220 MG tablet Take 4 tablets (880 mg total) by mouth 2 (two) times daily as needed (headache).  . phenylephrine (SUDAFED PE) 10 MG TABS tablet Take 10 mg by mouth every 4 (four) hours as needed.  . ranitidine (ZANTAC) 300 MG tablet Take 1 tablet (300 mg total) by mouth at bedtime.  . rizatriptan (MAXALT-MLT) 10 MG disintegrating tablet Take 1 tablet (10 mg total) by mouth daily as needed for migraine. May repeat in 2 hours if needed  . sertraline (ZOLOFT) 100 MG tablet Take 2 tablets (200 mg total) by mouth daily.  . ziprasidone (GEODON) 20 MG capsule Take 20 mg by mouth 2 (two) times daily with a meal.   . [DISCONTINUED] Pediatric Multivit-Minerals-C (CHILDRENS MULTIVITAMIN) 60 MG CHEW Chew 1 tablet by mouth daily.  . [DISCONTINUED] primidone (MYSOLINE) 50 MG tablet Take 50 mg by mouth at bedtime.   No facility-administered encounter medications on file as of 03/04/2015.    Past Medical History  Diagnosis Date  . Bipolar 1 disorder ([place])   . Cancer ([place])     skin cancer to left ear  . Complication of anesthesia   . Asthma   . Shortness of breath   . GERD (gastroesophageal reflux disease)   . Headache(784.0)   . Neuromuscular disorder ([place]) 02/08/2012    TREMORS   . Arthritis   . Sleep apnea      has cpap, doesnt use, lost machine   . Thyroid disease     no present treatment 03-18-14  . Hypothyroidism   . Allergy     seasonal  . Interstitial cystitis     Past Surgical History  Procedure Laterality Date  . Cholecystectomy    . Tonsillectomy    . Cystoscopy    . Knee surgery      multiple knee arthroscopy  . Carpal tunnel release Bilateral   . Ulnar nerve transposition    . Breast lumpectomy    . Wisdom tooth extraction    . Bunionectomy    . Ingrown toenail surgery    . Nerve damage and repair right thand    . Trigger finger release  x2 with repairs   . Endometrial ablation    . Rectal fissure    . Arthritic finger stabilizer for arthritis    . Squamous cell carcinoma left ear    . Early stage melanoma back    . Nasal septum surgery    . Colonoscopy    . Upper gastrointestinal endoscopy  12-17-2004  . Dilation and curettage of uterus      Social History   Social History  . Marital Status: Divorced    Spouse Name: N/A  . Number of Children: N/A  . Years of Education: N/A   Occupational History  . Not on file.   Social History Main Topics  . Smoking status: Never Smoker   . Smokeless tobacco: Never Used  . Alcohol Use: 0.0 oz/week    0 Standard drinks or equivalent per week     Comment: once every 3-4 months  . Drug Use: No  . Sexual Activity: Not Currently   Other Topics Concern  . Not on file   Social History Narrative    Family Status  Relation Status Death Age  . Mother Deceased     HTN, thyroid  . Father Deceased     testicular cancer  . Sister Alive     colon polyps    Review of Systems Occ pain in the right jaw.  C/o paresthesias on lateral and anterior thigh, L more than right.   A complete 10 system ROS was obtained and was negative apart from what is mentioned.   Objective:   VITALS:   Filed Vitals:   03/04/15 1415  BP: 128/80  Pulse: 86  Height: 5' 0.5" (1.537 m)  Weight: 158 lb (71.668 kg)   Gen:  Appears stated  age and in NAD.   HEENT:  Normocephalic, atraumatic. The mucous membranes are moist. The superficial temporal arteries are without ropiness or tenderness. Cardiovascular: Regular rate and rhythm. Lungs: Clear to auscultation bilaterally. Neck: There are no carotid bruits noted bilaterally.  NEUROLOGICAL:  Orientation:  The patient is alert and oriented x 3.  Recent and remote memory are intact.  Able to name objects and repeat without trouble.  Fund of knowledge is appropriate.  She is verbose and has trouble staying on topic Cranial nerves: There is good facial symmetry. The pupils are equal round and reactive to light bilaterally. Fundoscopic exam reveals clear disc margins bilaterally. Extraocular muscles are intact and visual fields are full to confrontational testing. Speech is fluent and clear. Soft palate rises symmetrically and there is no tongue deviation. Hearing is intact to conversational tone. Tone: Tone is good throughout. Sensation: Sensation is intact to light touch and pinprick throughout (facial, trunk, extremities). Vibration is intact at the bilateral big toe. There is no extinction with double simultaneous stimulation. There is no sensory dermatomal level identified. Coordination:  The patient has no dysdiadichokinesia or dysmetria. Motor: Strength is 5/5 in the bilateral upper and lower extremities.  Shoulder shrug is equal bilaterally.  There is no pronator drift.  There are no fasciculations noted. DTR's: Deep tendon reflexes are 2+-3-/4 at the bilateral biceps, triceps, brachioradialis, patella and achilles.  Plantar responses are downgoing bilaterally. Gait and Station: The patient is able to ambulate without difficulty. The patient is able to heel toe walk without any difficulty. The patient is able to ambulate in a tandem fashion. The patient is able to stand in the Romberg position.   MOVEMENT EXAM: Tremor:  There is almost no postural tremor.  There is no rest tremor.   There is minimal tremor evident when she draws Archimedes spirals.  She spills just a little bit of water when asked to pour a full glass of water from one glass to another.  Lab Results  Component Value Date   TSH 4.511* 11/21/2014        Assessment/Plan:   1.Tremor.  -This is likely due to medication.  She is on multiple medications that cause tremor, especially lithium, but also depakote and recently was started on geodon.  She is also on albuterol but rarely uses that because she knows that causes tremor.    -She used to be on primidone in the past, but I told her I really would not recommend going back on that given that I generally do not recommend medications just to cover up the side effects of other medications.  However, she was insistent that she wanted to try something on an as-needed basis.  I do not really think she is a candidate for beta blocker therapy and she has tried primidone in the past.  I only gave her it for as needed use. 2.  Hypothyroidism  -she was just started back on the synthroid a month ago.  I told her that tremor may settle down as her thyroid function is normalized again. 3.  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  She has apparently had this for many years, even when she was very thin.  Talked to her about making sure that she is wearing loosefitting undergarments.  I do not want to add further medication for this.  She is on a long list of medication. 4.  Follow up is anticipated in the next 6 months, sooner should new neurologic issues arise.  Much greater than 50% of this visit was spent in counseling with the patient.  Total face to face time:  60 min

## 2015-03-04 NOTE — Patient Instructions (Addendum)
-  I think your tremor is likely due medication (lithium, depakote, albuterol, and even geodon).  Lithium is likely the biggest offender.  You can try as needed primidone- 50mg  - would probably take this at night to avoid side effects.    -I would not try the primidone until your thyroid medication is fully adjusted

## 2015-03-10 ENCOUNTER — Ambulatory Visit: Payer: Self-pay | Attending: Family Medicine | Admitting: Family Medicine

## 2015-03-10 ENCOUNTER — Encounter: Payer: Self-pay | Admitting: Family Medicine

## 2015-03-10 VITALS — BP 136/83 | HR 96 | Temp 98.1°F | Resp 16 | Ht 60.5 in | Wt 166.0 lb

## 2015-03-10 DIAGNOSIS — Z85828 Personal history of other malignant neoplasm of skin: Secondary | ICD-10-CM | POA: Insufficient documentation

## 2015-03-10 DIAGNOSIS — M25561 Pain in right knee: Secondary | ICD-10-CM

## 2015-03-10 DIAGNOSIS — Z0000 Encounter for general adult medical examination without abnormal findings: Secondary | ICD-10-CM

## 2015-03-10 DIAGNOSIS — M25569 Pain in unspecified knee: Secondary | ICD-10-CM | POA: Insufficient documentation

## 2015-03-10 DIAGNOSIS — Z79899 Other long term (current) drug therapy: Secondary | ICD-10-CM | POA: Insufficient documentation

## 2015-03-10 DIAGNOSIS — M170 Bilateral primary osteoarthritis of knee: Secondary | ICD-10-CM

## 2015-03-10 DIAGNOSIS — K219 Gastro-esophageal reflux disease without esophagitis: Secondary | ICD-10-CM | POA: Insufficient documentation

## 2015-03-10 DIAGNOSIS — M25562 Pain in left knee: Secondary | ICD-10-CM

## 2015-03-10 LAB — POCT GLYCOSYLATED HEMOGLOBIN (HGB A1C): HEMOGLOBIN A1C: 5.2

## 2015-03-10 MED ORDER — FAMOTIDINE 20 MG PO TABS: 20.0000 mg | ORAL_TABLET | Freq: Two times a day (BID) | ORAL | Status: DC

## 2015-03-10 MED FILL — ?FAMOTIDINE 20 MG TABLET: 20 | 30 days supply | Qty: 60 | Fill #0

## 2015-03-10 MED FILL — ZIPRASIDONE HCL 20 MG CAP: 20 | 30 days supply | Qty: 60 | Fill #0

## 2015-03-10 MED FILL — SERTRALINE HCL 100 MG TAB: 100 | 30 days supply | Qty: 60 | Fill #0

## 2015-03-10 MED FILL — GABAPENTIN 300 MG CAPSULE: 300 | 30 days supply | Qty: 90 | Fill #0

## 2015-03-10 MED FILL — LITHIUM CARBONATE 300 MG CA: 300 | 30 days supply | Qty: 30 | Fill #0

## 2015-03-10 MED FILL — DIVALPROEX SOD ER 500 MG TA: 500 | 30 days supply | Qty: 30 | Fill #0

## 2015-03-10 NOTE — Progress Notes (Signed)
F/U GERD and nausea  Referral to dermatology  Pain scale # 8 knee pain  No tobacco user  No suicidal thought in the past two weeks

## 2015-03-10 NOTE — Progress Notes (Addendum)
Subjective:  Patient ID: [name], female    DOB: 03-22-1955  Age: 60 y.o. MRN: [id number]  CC: Gastroesophageal Reflux   HPI RONNEKA KELCH presents for    1. GERD: she endorse reflux. Taking xantac and pepto bismol without relief. Has nausea and abdominal pain. Would like a med change. No fever, chills or weight loss.   2. Knee pain: both knees. chronic pain. NSAID helps but does control pain. She is interested in pain management.   3. Hx of skin cancer: she has hx of cancer on her L ear. Request derm referral. No specific areas of concern   Past Medical History  Diagnosis Date  . Bipolar 1 disorder ([place])   . 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 Complication of anesthesia   . Asthma   . Shortness of breath   . GERD (gastroesophageal reflux disease)   . Headache(784.0)   . Neuromuscular disorder ([place]) 02/08/2012    TREMORS   . Arthritis   . Sleep apnea     has cpap, doesnt use, lost machine   . Thyroid disease     no present treatment 03-18-14  . Hypothyroidism   . Allergy     seasonal  . Interstitial cystitis     Social History  Substance Use Topics  . Smoking status: Never Smoker   . Smokeless tobacco: Never Used  . Alcohol Use: 0.0 oz/week    0 Standard drinks or equivalent per week     Comment: once every 3-4 months    Outpatient Prescriptions Prior to Visit  Medication Sig Dispense Refill  . albuterol (PROVENTIL HFA;VENTOLIN HFA) 108 (90 BASE) MCG/ACT inhaler Inhale 1-2 puffs into the lungs daily as needed for wheezing or shortness of breath. 3 Inhaler 3  . amitriptyline (ELAVIL) 25 MG tablet Take 25 mg by mouth at bedtime.    Marland Kitchen anti-nausea (EMETROL) solution Take 30 mLs by mouth every 15 (fifteen) minutes as needed for nausea or vomiting.    . beclomethasone (QVAR) 80 MCG/ACT inhaler Inhale 2 puffs into the lungs 2 (two) times daily. 3 Inhaler 5  . bismuth subsalicylate (PEPTO BISMOL) 262 MG chewable tablet Chew 524 mg by mouth as needed.      . cetirizine (ZYRTEC) 10 MG tablet Take 1 tablet (10 mg total) by mouth daily. 30 tablet 5  . chlorpheniramine (CHLOR-TRIMETON) 4 MG tablet Take 4 mg by mouth 2 (two) times daily as needed for allergies.    . clonazePAM (KLONOPIN) 0.5 MG tablet Take 0.5 mg by mouth at bedtime.    . divalproex (DEPAKOTE ER) 250 MG 24 hr tablet Take 4 tablets (1,000 mg total) by mouth at bedtime. (Patient taking differently: Take 500 mg by mouth at bedtime. ) 120 tablet 3  . estradiol (ESTRACE) 0.1 MG/GM vaginal cream Place 1 Applicatorful vaginally at bedtime. 42.5 g 12  . fluticasone (FLONASE) 50 MCG/ACT nasal spray Place 2 sprays into both nostrils daily. 16 g 6  . gabapentin (NEURONTIN) 300 MG capsule Take 300 mg by mouth 3 (three) times daily.    . hydrocortisone cream 1 % Apply 1 application topically 2 (two) times daily.    . hydrOXYzine (ATARAX/VISTARIL) 25 MG tablet Take 25 mg by mouth at bedtime.    Marland Kitchen levothyroxine (SYNTHROID, LEVOTHROID) 50 MCG tablet Take 1 tablet (50 mcg total) by mouth daily. 30 tablet 3  . lithium carbonate 300 MG capsule Take 300 mg by mouth daily.    Marland Kitchen  loperamide (IMODIUM A-D) 2 MG tablet Take 2 mg by mouth 4 (four) times daily as needed for diarrhea or loose stools.    . naproxen sodium (ANAPROX) 220 MG tablet Take 4 tablets (880 mg total) by mouth 2 (two) times daily as needed (headache). 60 tablet 5  . phenylephrine (SUDAFED PE) 10 MG TABS tablet Take 10 mg by mouth every 4 (four) hours as needed.    . primidone (MYSOLINE) 50 MG tablet Take 1 tablet (50 mg total) by mouth as needed. 30 tablet 0  . ranitidine (ZANTAC) 300 MG tablet Take 1 tablet (300 mg total) by mouth at bedtime. 30 tablet 5  . rizatriptan (MAXALT-MLT) 10 MG disintegrating tablet Take 1 tablet (10 mg total) by mouth daily as needed for migraine. May repeat in 2 hours if needed 10 tablet 3  . sertraline (ZOLOFT) 100 MG tablet Take 2 tablets (200 mg total) by mouth daily. 14 tablet 0  . ziprasidone (GEODON) 20 MG  capsule Take 20 mg by mouth 2 (two) times daily with a meal.   2   No facility-administered medications prior to visit.    ROS Review of Systems  Constitutional: Negative for fever and chills.  Eyes: Negative for visual disturbance.  Respiratory: Negative for shortness of breath.   Cardiovascular: Positive for leg swelling. Negative for chest pain.  Gastrointestinal: Positive for nausea and abdominal pain. Negative for blood in stool.  Musculoskeletal: Positive for arthralgias. Negative for back pain.  Skin: Negative for rash.  Allergic/Immunologic: Negative for immunocompromised state.  Hematological: Negative for adenopathy. Does not bruise/bleed easily.  Psychiatric/Behavioral: Negative for suicidal ideas and dysphoric mood.    Objective:  BP 136/83 mmHg  Pulse 96  Temp(Src) 98.1 F (36.7 C) (Oral)  Resp 16  Ht 5' 0.5" (1.537 m)  Wt 166 lb (75.297 kg)  BMI 31.87 kg/m2  SpO2 97%  BP/Weight 03/10/2015 03/04/2015 [id number]  Systolic BP [id number]  Diastolic BP 83 80 81  Wt. (Lbs) 166 158 -  BMI 31.87 30.34 -    Physical Exam  Constitutional: She is oriented to person, place, and time. She appears well-developed and well-nourished. No distress.  HENT:  Head: Normocephalic and atraumatic.  Cardiovascular: Normal rate, regular rhythm, normal heart sounds and intact distal pulses.   Pulmonary/Chest: Effort normal and breath sounds normal.  Musculoskeletal: She exhibits no edema.  Neurological: She is alert and oriented to person, place, and time.  Skin: Skin is warm and dry. No rash noted.  Psychiatric: She has a normal mood and affect.   Lab Results  Component Value Date   HGBA1C 5.20 03/10/2015    Assessment & Plan:  Scotty was seen today for gastroesophageal reflux.  Diagnoses and all orders for this visit:  Healthcare maintenance -     POCT glycosylated hemoglobin (Hb A1C)  Primary osteoarthritis of both knees -     Ambulatory referral to Pain  Clinic  Bilateral knee pain -     Ambulatory referral to Pain Clinic  History of skin cancer -     Ambulatory referral to Dermatology  Gastroesophageal reflux disease, esophagitis presence not specified -     famotidine (PEPCID) 20 MG tablet; Take 1 tablet (20 mg total) by mouth 2 (two) times daily.    Meds ordered this encounter  Medications  . famotidine (PEPCID) 20 MG tablet    Sig: Take 1 tablet (20 mg total) by mouth 2 (two) times daily.    Dispense:  60 tablet  Refill:  5    DC RANITIDINE    Follow-up: No Follow-up on file.   Boykin Nearing MD

## 2015-03-10 NOTE — Patient Instructions (Addendum)
[name] was seen today for gastroesophageal reflux.  Diagnoses and all orders for this visit:  Healthcare maintenance -     POCT glycosylated hemoglobin (Hb A1C)  Primary osteoarthritis of both knees -     Ambulatory referral to Pain Clinic  Bilateral knee pain -     Ambulatory referral to Pain Clinic  History of skin cancer -     Ambulatory referral to Dermatology  Gastroesophageal reflux disease, esophagitis presence not specified -     famotidine (PEPCID) 20 MG tablet; Take 1 tablet (20 mg total) by mouth 2 (two) times daily.   F/u in 6 weeks as you are due for your pap smear   Dr. Adrian Blackwater

## 2015-04-01 MED FILL — LEVOTHYROXINE 50 MCG TABLET: 50 | 30 days supply | Qty: 30 | Fill #3

## 2015-04-04 ENCOUNTER — Encounter: Payer: Self-pay | Admitting: Clinical

## 2015-04-04 NOTE — Progress Notes (Signed)
Depression screen [facility] 2/9 03/10/2015 11/21/2014  Decreased Interest 2 3  Down, Depressed, Hopeless 2 2  PHQ - 2 Score 4 5  Altered sleeping 3 2  Tired, decreased energy 3 2  Change in appetite 1 2  Feeling bad or failure about yourself  2 1  Trouble concentrating 1 1  Moving slowly or fidgety/restless 2 1  Suicidal thoughts 0 0  PHQ-9 Score 16 14    GAD 7 : Generalized Anxiety Score 03/10/2015  Nervous, Anxious, on Edge 3  Control/stop worrying 2  Worry too much - different things 2  Trouble relaxing 3  Restless 2  Easily annoyed or irritable 3  Afraid - awful might happen 2  Total GAD 7 Score 17

## 2015-04-08 ENCOUNTER — Ambulatory Visit: Payer: Self-pay | Attending: Family Medicine

## 2015-04-10 ENCOUNTER — Other Ambulatory Visit: Payer: Self-pay

## 2015-04-10 DIAGNOSIS — Z1231 Encounter for screening mammogram for malignant neoplasm of breast: Secondary | ICD-10-CM

## 2015-04-17 ENCOUNTER — Telehealth: Payer: Self-pay | Admitting: Internal Medicine

## 2015-04-17 NOTE — Telephone Encounter (Signed)
Pt would like to re-est with dr Regis Bill, Pt will have medicare in apri 2017

## 2015-04-18 NOTE — Telephone Encounter (Signed)
Do not have capacity to take new patient  even reestablishing patient. I cannot accept her back. Wishing her well.

## 2015-04-21 ENCOUNTER — Other Ambulatory Visit: Payer: Self-pay | Admitting: Family Medicine

## 2015-04-21 MED FILL — LITHIUM CARBONATE 300 MG CA: 300 | 30 days supply | Qty: 30 | Fill #1

## 2015-04-21 MED FILL — ?HYDROXYZINE HCL 25 MG TAB: 25 | 26 days supply | Qty: 26 | Fill #7

## 2015-04-21 MED FILL — ?SERTRALINE HCL 100 MG TAB: 100 | 30 days supply | Qty: 60 | Fill #1

## 2015-04-21 MED FILL — DIVALPROEX SOD ER 500 MG TA: 500 | 30 days supply | Qty: 30 | Fill #1

## 2015-04-21 MED FILL — ?AMITRIPTYLINE HCL 25 MG TA: 25 | 30 days supply | Qty: 30 | Fill #7

## 2015-04-21 MED FILL — !QVAR 80 MCG ORAL INHALER: 80 MCG | 30 days supply | Qty: 1 | Fill #3

## 2015-04-21 MED FILL — ?RIZATRIPTAN 10MG ODT: 10 MG | 26 days supply | Qty: 9 | Fill #3

## 2015-04-21 MED FILL — GABAPENTIN 300 MG CAPSULE: 300 | 30 days supply | Qty: 90 | Fill #1

## 2015-04-22 MED FILL — !ESTRACE 0.01% CREAM: 0.01% | 24 days supply | Qty: 24 | Fill #2

## 2015-04-22 NOTE — Telephone Encounter (Signed)
Pt aware Dr Regis Bill unable to accept her back. Offered pt to establish with another provider, pt will go with Dr Martin[place]. Pt is on the list.

## 2015-04-22 NOTE — Telephone Encounter (Signed)
lmom for [place] to callback

## 2015-04-23 ENCOUNTER — Telehealth: Payer: Self-pay | Admitting: Family Medicine

## 2015-04-23 ENCOUNTER — Ambulatory Visit
Admission: RE | Admit: 2015-04-23 | Discharge: 2015-04-23 | Disposition: A | Discharge status: Home / Self Care | Payer: No Typology Code available for payment source | Source: Ambulatory Visit

## 2015-04-23 DIAGNOSIS — Z1231 Encounter for screening mammogram for malignant neoplasm of breast: Secondary | ICD-10-CM

## 2015-04-23 DIAGNOSIS — E039 Hypothyroidism, unspecified: Secondary | ICD-10-CM

## 2015-04-23 NOTE — Telephone Encounter (Signed)
Pt. Came in to drop off a parking placard application for PCP to fill out. Paperwork will be put in the providers box. Please f/u

## 2015-04-23 NOTE — Telephone Encounter (Signed)
Pt was refer to a Dermatology Dx  Hx of skin care [facility]  And they review the notes and there is nothing in the referral notes saying what she needs to be seen for other than she has a hx of skin cancer. We would see her for a specific concern, not a full skin check. Can you help me thanks .

## 2015-04-24 MED FILL — ZIPRASIDONE HCL 20 MG CAP: 20 | 30 days supply | Qty: 60 | Fill #1

## 2015-04-24 MED FILL — !VENTOLIN HFA INHALER: 108 (90 BAS | 25 days supply | Qty: 18 | Fill #2

## 2015-04-24 MED FILL — ?FAMOTIDINE 20 MG TABLET: 20 | 30 days supply | Qty: 60 | Fill #1

## 2015-04-25 NOTE — Telephone Encounter (Signed)
Pt. Came in requesting a med refill on levothyroxine (SYNTHROID, LEVOTHROID) 50 MCG tablet. Pt. Stated she had not lost weight, and dry skin. Please f/u with pt.

## 2015-04-28 MED ORDER — LEVOTHYROXINE SODIUM 50 MCG PO TABS: 50.0000 ug | ORAL_TABLET | Freq: Every day | ORAL | Status: DC

## 2015-04-28 MED FILL — LEVOTHYROXINE 50 MCG TABLET: 50 | 30 days supply | Qty: 30 | Fill #0

## 2015-04-28 NOTE — Telephone Encounter (Signed)
Please inform patient that form for disability parking placard is ready for pick up Derm referral is not available for hx of skin cancer but if she has new lesions please return for f/u exam and I will check them.  Finally, synthroid refilled

## 2015-04-28 NOTE — Telephone Encounter (Signed)
Pt notified Form ready to be pick up  Synthroid refilled Advised referral Derm not available for Hx skin cancer, advised to F/U with PCP if new lesion  Pt stated "not expert on lesion and unsure if she has a new on or not."

## 2015-05-13 MED FILL — OXYBUTYNIN 5 MG TABLET: 5 | 30 days supply | Qty: 180 | Fill #0

## 2015-05-19 DIAGNOSIS — F423 Hoarding disorder: Secondary | ICD-10-CM | POA: Diagnosis not present

## 2015-05-19 DIAGNOSIS — F3132 Bipolar disorder, current episode depressed, moderate: Secondary | ICD-10-CM | POA: Diagnosis not present

## 2015-05-19 DIAGNOSIS — F411 Generalized anxiety disorder: Secondary | ICD-10-CM | POA: Diagnosis not present

## 2015-05-27 ENCOUNTER — Ambulatory Visit: Payer: Self-pay | Admitting: Gastroenterology

## 2015-05-30 ENCOUNTER — Encounter: Payer: Self-pay | Admitting: Physical Medicine & Rehabilitation

## 2015-05-30 ENCOUNTER — Encounter: Payer: Medicare Other | Attending: Physical Medicine & Rehabilitation

## 2015-05-30 ENCOUNTER — Ambulatory Visit (HOSPITAL_BASED_OUTPATIENT_CLINIC_OR_DEPARTMENT_OTHER): Payer: No Typology Code available for payment source | Admitting: Physical Medicine & Rehabilitation

## 2015-05-30 VITALS — BP 125/64 | HR 93

## 2015-05-30 DIAGNOSIS — K219 Gastro-esophageal reflux disease without esophagitis: Secondary | ICD-10-CM | POA: Insufficient documentation

## 2015-05-30 DIAGNOSIS — Z5181 Encounter for therapeutic drug level monitoring: Secondary | ICD-10-CM | POA: Diagnosis not present

## 2015-05-30 DIAGNOSIS — M170 Bilateral primary osteoarthritis of knee: Secondary | ICD-10-CM | POA: Insufficient documentation

## 2015-05-30 DIAGNOSIS — Z79899 Other long term (current) drug therapy: Secondary | ICD-10-CM | POA: Insufficient documentation

## 2015-05-30 DIAGNOSIS — R51 Headache: Secondary | ICD-10-CM | POA: Insufficient documentation

## 2015-05-30 DIAGNOSIS — M25561 Pain in right knee: Secondary | ICD-10-CM

## 2015-05-30 DIAGNOSIS — M25562 Pain in left knee: Secondary | ICD-10-CM | POA: Diagnosis not present

## 2015-05-30 DIAGNOSIS — E079 Disorder of thyroid, unspecified: Secondary | ICD-10-CM | POA: Insufficient documentation

## 2015-05-30 DIAGNOSIS — F319 Bipolar disorder, unspecified: Secondary | ICD-10-CM | POA: Diagnosis not present

## 2015-05-30 NOTE — Progress Notes (Signed)
Subjective:    Patient ID: [name], female    DOB: 1955/02/12, 60 y.o.   MRN: [id number]  HPI 60 year old female with several year history of bilateral knee pain right greater than the left side. She has seen orthopedic surgery, last time was over a year ago.. Patient has lost her insurance in interval time but is getting Medicare soon. Total knee replacement was discussed as an option although at that time patient did not wish to proceed with this. She has tried intra-articular injections. She has tried pain medications. She does not have benefits for physical therapy. Patient also gives a history of possible fibromyalgia syndrome. She states that she sometimes gets migratory pains including the upper extremity and back area. She does not have a history of lumbar surgeries, no lower extremity numbness no pain that radiates from the back to the knee.  Pain Inventory Average Pain 7 Pain Right Now 6 My pain is sharp, burning, dull, stabbing and tender  In the last 24 hours, has pain interfered with the following? General activity 7 Relation with others 7 Enjoyment of life 9 What TIME of day is your pain at its worst? varies Sleep (in general) Fair  Pain is worse with: walking, bending, sitting, inactivity, standing and some activites Pain improves with: rest, pacing activities and medication Relief from Meds: not answered  Mobility use a cane ability to climb steps?  yes do you drive?  yes  Function disabled: date disabled .  Neuro/Psych bladder control problems weakness numbness tremor tingling anxiety  Prior Studies Any changes since last visit?  no *RADIOLOGY REPORT*  Clinical Data: [place]. Pain and swelling.  RIGHT KNEE - COMPLETE 4+ VIEW  Comparison: None  Findings: There are tricompartmental degenerative changes with joint space narrowing and osteophytic spurring. No acute fracture, osteochondral lesion or joint  effusion.  IMPRESSION: Degenerative changes but no acute fracture.  Original Report Authenticated By: P. Kalman Jewels, M.D. Physicians involved in your care Primary care [place]   Family History  Problem Relation Age of Onset  . Coronary artery disease    . Hypertension Mother     deceased from MVA complications  . Thyroid disease Mother   . Testicular cancer Father   . Colon polyps Sister    Social History   Social History  . Marital Status: Divorced    Spouse Name: N/A  . Number of Children: N/A  . Years of Education: N/A   Social History Main Topics  . Smoking status: Never Smoker   . Smokeless tobacco: Never Used  . Alcohol Use: 0.0 oz/week    0 Standard drinks or equivalent per week     Comment: once every 3-4 months  . Drug Use: No  . Sexual Activity: Not Currently   Other Topics Concern  . None   Social History Narrative   Past Surgical History  Procedure Laterality Date  . Cholecystectomy    . Tonsillectomy    . Cystoscopy    . Knee surgery      multiple knee arthroscopy  . Carpal tunnel release Bilateral   . Ulnar nerve transposition    . Breast lumpectomy    . Wisdom tooth extraction    . Bunionectomy    . Ingrown toenail surgery    . Nerve damage and repair right thand    . Trigger finger release      x2 with repairs   . Endometrial ablation    . Rectal fissure    .  Arthritic finger stabilizer for arthritis    . Squamous cell carcinoma left ear    . Early stage melanoma back    . Nasal septum surgery    . Colonoscopy    . Upper gastrointestinal endoscopy  12-17-2004  . Dilation and curettage of uterus    . Tmj arthroplasty      multiple   Past Medical History  Diagnosis Date  . Bipolar 1 disorder ([place])   . Cancer ([place])     skin cancer to left ear  . Complication of anesthesia   . Asthma   . Shortness of breath   . GERD (gastroesophageal reflux disease)   . Headache(784.0)   . Neuromuscular disorder  ([place]) 02/08/2012    TREMORS   . Arthritis   . Sleep apnea     has cpap, doesnt use, lost machine   . Thyroid disease     no present treatment 03-18-14  . Hypothyroidism   . Allergy     seasonal  . Interstitial cystitis    BP 125/64 mmHg  Pulse 93  SpO2 97%  Opioid Risk Score:  Patient has a history of marijuana use as well as history of Ethanol abuse, history of bipolar disorder Fall Risk Score:  `1  Depression screen PHQ 2/9  Depression screen [facility] 2/9 05/30/2015 03/10/2015 11/21/2014  Decreased Interest 2 2 3   Down, Depressed, Hopeless 1 2 2   PHQ - 2 Score 3 4 5   Altered sleeping 3 3 2   Tired, decreased energy 2 3 2   Change in appetite 0 1 2  Feeling bad or failure about yourself  0 2 1  Trouble concentrating 1 1 1   Moving slowly or fidgety/restless 0 2 1  Suicidal thoughts 0 0 0  PHQ-9 Score 9 16 14   Difficult doing work/chores Very difficult - -     Review of Systems  Constitutional: Positive for unexpected weight change.  Respiratory: Positive for apnea.   Gastrointestinal: Positive for constipation.  All other systems reviewed and are negative.      Objective:   Physical Exam  Constitutional: She appears well-developed.  obese  HENT:  Head: Normocephalic and atraumatic.  Eyes: Conjunctivae and EOM are normal. Pupils are equal, round, and reactive to light.  Neck: Normal range of motion.  Musculoskeletal:       Right knee: She exhibits normal range of motion and no effusion. Tenderness found. Medial joint line and patellar tendon tenderness noted.       Left knee: She exhibits normal range of motion, no swelling and no effusion. Tenderness found. Medial joint line and patellar tendon tenderness noted.       Cervical back: She exhibits tenderness. She exhibits normal range of motion.       Lumbar back: She exhibits no tenderness.  8/18 fibromyalgia tender points positive  Neurological:  Motor strength is 5/5 bilateral deltoid, biceps, triceps, grip, hip  flexor, knee extensor, ankle dorsiflexor and plantar flexor  Psychiatric: Her behavior is normal. Thought content normal. Her mood appears anxious. Her speech is rapid and/or pressured. Cognition and memory are normal.  Nursing note and vitals reviewed.         Assessment & Plan:  1. Bilateral knee pain, Right greater than left, exam and symptoms as well as imaging studies consistent with osteoarthritis of the knees. Last imaging study on Epic was from 2013 patient states she's had more recent films perhaps in 2016.  She is not a good candidate for chronic narcotic  analgesic medications due to comorbid factors.  She has not had good relief with intra-articular steroid injections. We discussed other treatment options, she has multiple drug allergies including bupivacaine and therefore would not recommend lidocaine. Would potentially benefit from cryoablation of the anterior femoral cutaneous nerve as well as the infrapatellar branch of the saphenous nerve. Can use spray to provide some local analgesia for the procedure. We discussed that if this procedure is not helpful that she should follow-up with her orthopedic surgeon now that she has Medicare  We also discussed that if this procedure is helpful on the right side this can be repeated on the left side.

## 2015-05-30 NOTE — Patient Instructions (Signed)
Would recommend a trial of cryoablation to the right knee. If it's helpful we can do the left knee. If it is not helpful I would recommend reevaluation by Dr. Durward Fortes to see if surgery is an option for you.

## 2015-06-09 ENCOUNTER — Ambulatory Visit (INDEPENDENT_AMBULATORY_CARE_PROVIDER_SITE_OTHER): Payer: Medicare Other | Admitting: Gastroenterology

## 2015-06-09 ENCOUNTER — Encounter: Payer: Self-pay | Admitting: Gastroenterology

## 2015-06-09 VITALS — BP 124/74 | HR 74 | Ht 60.5 in | Wt 160.0 lb

## 2015-06-09 DIAGNOSIS — K219 Gastro-esophageal reflux disease without esophagitis: Secondary | ICD-10-CM | POA: Diagnosis not present

## 2015-06-09 MED ORDER — OMEPRAZOLE 40 MG PO CPDR: 40.0000 mg | DELAYED_RELEASE_CAPSULE | Freq: Every day | ORAL | Status: DC

## 2015-06-09 MED FILL — OMEPRAZOLE DR 40 MG CAPSULE: 40 | 30 days supply | Qty: 30 | Fill #0

## 2015-06-09 NOTE — Progress Notes (Signed)
[facility] Progress Note  Chief Complaint: GERD  Subjective History:  This patient sees me as a new physician, having previously seen Dr. Olevia Perches. Her last office contact was an upper endoscopy in February 2016, were Dr. Olevia Perches found a small hiatal hernia, nonspecific gastritis that was H. pylori negative. Colonoscopy showed no adenomatous polyps. Curiously, the patient has no recollection of having had an EGD that day. For the last 6-9 months she has had worsening pyrosis and regurgitation with some nocturnal GERD symptoms. She also at times has a ill-defined discomfort after drinking cold liquids that might give her a lingering sensation of coldness in the chest. In addition, she has tended toward constipation, which might be related to the fact that she is taking at least 3 times tablets a day for control of her heartburn. She believes Dr. Olevia Perches previously prescribed Nexium, but Crislyn was not sure she should take it long-term because she saw some commercials on television regarding its safety. Getting incomplete relief with Zantac and Pepcid.    ROS: Cardiovascular:  no chest pain Respiratory: no dyspnea  The patient's Past Medical, Family and Social History were reviewed and are on file in the EMR.  Objective:  Med list reviewed  Vital signs in last 24 hrs: Filed Vitals:   06/09/15 1056  BP: 124/74  Pulse: 74    Physical Exam   HEENT: sclera anicteric, oral mucosa moist without lesions  Neck: supple, no thyromegaly, JVD or lymphadenopathy  Cardiac: RRR without murmurs, S1S2 heard, no peripheral edema  Pulm: clear to auscultation bilaterally, normal RR and effort noted  Abdomen: soft, No tenderness, with active bowel sounds. No guarding or palpable hepatosplenomegaly.  Skin; warm and dry, no jaundice or rash    @ASSESSMENTPLANBEGIN @ Assessment: Encounter Diagnosis  Name Primary?  . Gastroesophageal reflux disease without esophagitis Yes    I suspect there or  significant elements of both nonacid reflux and esophageal hypersensitivity.  Plan: Omeprazole 40 mg once daily. If she gets little or no improvement from this, then she would need 24-hour pH and esophageal motility testing to quantify the reflux. Although she has a hiatal hernia, a patient with some degree of suspected visceral hypersensitivity might not have the expected improvement after fundoplication.   She will also make further efforts at weight loss and antireflux diet and lifestyle measures. She will call us in several weeks with an update. Nelida Meuse III

## 2015-06-09 NOTE — Patient Instructions (Addendum)
We have sent the following medications to your pharmacy for you to pick up at your convenience: Omeprazole   If you are age 59 or older, your body mass index should be between 23-30. Your Body mass index is 30.72 kg/(m^2). If this is out of the aforementioned range listed, please consider follow up with your Primary Care Provider.  If you are age 39 or younger, your body mass index should be between 19-25. Your Body mass index is 30.72 kg/(m^2). If this is out of the aformentioned range listed, please consider follow up with your Primary Care Provider.   Thank you for choosing [facility]  Dr Wilfrid Lund III

## 2015-06-10 DIAGNOSIS — M170 Bilateral primary osteoarthritis of knee: Secondary | ICD-10-CM | POA: Diagnosis not present

## 2015-06-11 DIAGNOSIS — F3132 Bipolar disorder, current episode depressed, moderate: Secondary | ICD-10-CM | POA: Diagnosis not present

## 2015-06-11 DIAGNOSIS — F422 Mixed obsessional thoughts and acts: Secondary | ICD-10-CM | POA: Diagnosis not present

## 2015-06-13 ENCOUNTER — Encounter: Payer: Medicare Other | Attending: Physical Medicine & Rehabilitation

## 2015-06-13 ENCOUNTER — Encounter: Payer: Self-pay | Admitting: Physical Medicine & Rehabilitation

## 2015-06-13 ENCOUNTER — Ambulatory Visit (HOSPITAL_BASED_OUTPATIENT_CLINIC_OR_DEPARTMENT_OTHER): Payer: Medicare Other | Admitting: Physical Medicine & Rehabilitation

## 2015-06-13 VITALS — BP 148/78 | HR 83 | Resp 14

## 2015-06-13 DIAGNOSIS — F319 Bipolar disorder, unspecified: Secondary | ICD-10-CM | POA: Diagnosis not present

## 2015-06-13 DIAGNOSIS — E079 Disorder of thyroid, unspecified: Secondary | ICD-10-CM | POA: Insufficient documentation

## 2015-06-13 DIAGNOSIS — M25561 Pain in right knee: Secondary | ICD-10-CM | POA: Diagnosis not present

## 2015-06-13 DIAGNOSIS — K219 Gastro-esophageal reflux disease without esophagitis: Secondary | ICD-10-CM | POA: Insufficient documentation

## 2015-06-13 DIAGNOSIS — G8929 Other chronic pain: Secondary | ICD-10-CM | POA: Diagnosis not present

## 2015-06-13 DIAGNOSIS — Z5181 Encounter for therapeutic drug level monitoring: Secondary | ICD-10-CM | POA: Insufficient documentation

## 2015-06-13 DIAGNOSIS — M25562 Pain in left knee: Secondary | ICD-10-CM | POA: Diagnosis not present

## 2015-06-13 DIAGNOSIS — M170 Bilateral primary osteoarthritis of knee: Secondary | ICD-10-CM | POA: Diagnosis not present

## 2015-06-13 DIAGNOSIS — Z79899 Other long term (current) drug therapy: Secondary | ICD-10-CM | POA: Insufficient documentation

## 2015-06-13 DIAGNOSIS — R51 Headache: Secondary | ICD-10-CM | POA: Insufficient documentation

## 2015-06-13 MED FILL — !ESTRACE 0.01% CREAM: 0.01% | 24 days supply | Qty: 24 | Fill #3

## 2015-06-13 MED FILL — ?SERTRALINE HCL 100 MG TAB: 100 | 30 days supply | Qty: 60 | Fill #2

## 2015-06-13 MED FILL — OXYBUTYNIN 5 MG TABLET: 5 | 30 days supply | Qty: 180 | Fill #1

## 2015-06-13 MED FILL — ?HYDROXYZINE HCL 25 MG TAB: 25 | 26 days supply | Qty: 26 | Fill #8

## 2015-06-13 MED FILL — !VENTOLIN HFA INHALER: 108 (90 BAS | 25 days supply | Qty: 18 | Fill #3

## 2015-06-13 MED FILL — ZIPRASIDONE HCL 20 MG CAP: 20 | 30 days supply | Qty: 60 | Fill #2

## 2015-06-13 MED FILL — GABAPENTIN 300 MG CAPSULE: 300 | 30 days supply | Qty: 90 | Fill #2

## 2015-06-13 MED FILL — ?LEVOTHYROXINE 50 MCG TABLE: 50 | 30 days supply | Qty: 30 | Fill #1

## 2015-06-13 MED FILL — ?RIZATRIPTAN 10MG ODT: 10 MG | 30 days supply | Qty: 5 | Fill #1

## 2015-06-13 MED FILL — ?AMITRIPTYLINE HCL 25 MG TA: 25 | 30 days supply | Qty: 30 | Fill #8

## 2015-06-13 MED FILL — QVAR 80 MCG ORAL INHALER: 80 | 30 days supply | Qty: 9 | Fill #4

## 2015-06-13 NOTE — Progress Notes (Signed)
Right Knee Cryoablation of the infrapatellar branch of the saphenous nerve as well as the anterior femoral cutaneous nerve  Indication is chronic right knee pain following total knee replacement. Pain has not responded to conservative care including , physical therapy, oral medications.Pain interferes with self-care and mobility as well as sleep  Informed consent was obtained after describing risks and benefits of the procedure with patient these include bleeding bruising and infection as well as nerve injury. The patient elects to proceed and has given written consent.  Patient placed in a supine position. A line 10 cm superior to the superior pole of the patella and parallel to the transverse axis of the patella  Was marked and sprayed with fluoromethane spray.  The Iovera cryoablation device with 3 needle Smart tip Was used to perform the cryoablation. 5 lesions were performed on the anterior femoral cutaneous nerve and 5 lesions were performed on the infrapatellar branch of the saphenous nerve along the anesthetized lines. Patient tolerated procedure well. Minimal blood loss.  Post procedure instructions were given. Follow-up appointment in 12 weeks, possible repeat  Addendum Patient was started on narcotic analgesics by her orthopedic physician. We discussed opioid riskWith positive history of THC which she disputes however there was a positive urine screen on 12/12/2012.Patient became very upset and started crying about this. CMA Amber in room. Some written records that I had reviewed indicated alcohol abuse I do not see this in her Epic chart and she also denies this. History of bipolar disorder Opioid risk is calculated as 6 If the alcohol use is not counted. This would be moderate risk If patient wishes to be on these medications chronically would need to check urine drug screen to review prior to any prescriptions from this office. She does have 30 tablets from her orthopedic office.

## 2015-06-17 MED FILL — FLUTICASONE PROP 50 MCG SPR: 50 | 30 days supply | Qty: 16 | Fill #1

## 2015-06-18 DIAGNOSIS — F422 Mixed obsessional thoughts and acts: Secondary | ICD-10-CM | POA: Diagnosis not present

## 2015-06-18 DIAGNOSIS — F3132 Bipolar disorder, current episode depressed, moderate: Secondary | ICD-10-CM | POA: Diagnosis not present

## 2015-06-23 DIAGNOSIS — F411 Generalized anxiety disorder: Secondary | ICD-10-CM | POA: Diagnosis not present

## 2015-06-24 DIAGNOSIS — F3132 Bipolar disorder, current episode depressed, moderate: Secondary | ICD-10-CM | POA: Diagnosis not present

## 2015-06-24 DIAGNOSIS — F422 Mixed obsessional thoughts and acts: Secondary | ICD-10-CM | POA: Diagnosis not present

## 2015-07-09 DIAGNOSIS — F3132 Bipolar disorder, current episode depressed, moderate: Secondary | ICD-10-CM | POA: Diagnosis not present

## 2015-07-09 DIAGNOSIS — F422 Mixed obsessional thoughts and acts: Secondary | ICD-10-CM | POA: Diagnosis not present

## 2015-07-14 DIAGNOSIS — F423 Hoarding disorder: Secondary | ICD-10-CM | POA: Diagnosis not present

## 2015-07-16 ENCOUNTER — Telehealth: Payer: Self-pay | Admitting: Family Medicine

## 2015-07-16 DIAGNOSIS — G43809 Other migraine, not intractable, without status migrainosus: Secondary | ICD-10-CM

## 2015-07-16 MED FILL — FLUTICASONE PROP 50 MCG SPR: 50 | 30 days supply | Qty: 16 | Fill #2

## 2015-07-16 MED FILL — OXYBUTYNIN 5 MG TABLET: 5 | 30 days supply | Qty: 180 | Fill #2

## 2015-07-16 MED FILL — ?LEVOTHYROXINE 50 MCG TABLE: 50 | 30 days supply | Qty: 30 | Fill #2

## 2015-07-16 MED FILL — !ESTRACE 0.01% CREAM: 0.01% | 24 days supply | Qty: 24 | Fill #4

## 2015-07-16 MED FILL — AMITRIPTYLINE HCL 25 MG TAB: 25 | 30 days supply | Qty: 30 | Fill #9

## 2015-07-16 MED FILL — ?HYDROXYZINE HCL 25 MG TAB: 25 | 26 days supply | Qty: 26 | Fill #9

## 2015-07-16 NOTE — Telephone Encounter (Signed)
Patient called needing a refill for rizatriptan Patient would like clarity of why dosage amount was adjusted. Please follow up.

## 2015-07-17 MED ORDER — RIZATRIPTAN BENZOATE 10 MG PO TBDP: 10.0000 mg | ORAL_TABLET | Freq: Every day | ORAL | Status: DC | PRN

## 2015-07-17 MED FILL — VENTOLIN HFA 90 MCG INHALER: 108 (90 BAS | 25 days supply | Qty: 18 | Fill #4

## 2015-07-17 MED FILL — ?RIZATRIPTAN 10MG ODT: 10 MG | 30 days supply | Qty: 5 | Fill #2

## 2015-07-17 NOTE — Telephone Encounter (Signed)
maxalt refilled maxalt has been 10 mg since patient requested increased dose on 12/05/2104

## 2015-07-21 ENCOUNTER — Institutional Professional Consult (permissible substitution): Payer: Self-pay | Admitting: Internal Medicine

## 2015-07-21 ENCOUNTER — Ambulatory Visit: Payer: Self-pay | Admitting: Gastroenterology

## 2015-07-21 DIAGNOSIS — F423 Hoarding disorder: Secondary | ICD-10-CM | POA: Diagnosis not present

## 2015-07-21 DIAGNOSIS — F3132 Bipolar disorder, current episode depressed, moderate: Secondary | ICD-10-CM | POA: Diagnosis not present

## 2015-07-21 DIAGNOSIS — F411 Generalized anxiety disorder: Secondary | ICD-10-CM | POA: Diagnosis not present

## 2015-07-21 MED FILL — GABAPENTIN 300 MG CAPSULE: 300 | 30 days supply | Qty: 90 | Fill #0

## 2015-07-21 MED FILL — ?SERTRALINE HCL 100 MG TAB: 100 | 30 days supply | Qty: 30 | Fill #0

## 2015-07-21 MED FILL — QVAR 80 MCG ORAL INHALER: 80 | 30 days supply | Qty: 9 | Fill #5

## 2015-07-21 MED FILL — ?RIZATRIPTAN 10MG ODT: 10 MG | 26 days supply | Qty: 9 | Fill #0

## 2015-07-21 MED FILL — ZIPRASIDONE HCL 20 MG CAP: 20 | 30 days supply | Qty: 60 | Fill #0

## 2015-07-21 MED FILL — LITHIUM CARBONATE 150 MG CA: 150 | 30 days supply | Qty: 30 | Fill #0

## 2015-07-22 MED FILL — OMEPRAZOLE DR 40 MG CAPSULE: 40 | 30 days supply | Qty: 30 | Fill #1

## 2015-08-05 ENCOUNTER — Ambulatory Visit: Payer: Self-pay | Admitting: Family Medicine

## 2015-08-05 ENCOUNTER — Other Ambulatory Visit: Payer: Self-pay

## 2015-08-05 DIAGNOSIS — E039 Hypothyroidism, unspecified: Secondary | ICD-10-CM

## 2015-08-05 DIAGNOSIS — G43809 Other migraine, not intractable, without status migrainosus: Secondary | ICD-10-CM

## 2015-08-05 DIAGNOSIS — J45909 Unspecified asthma, uncomplicated: Secondary | ICD-10-CM

## 2015-08-05 MED ORDER — DIVALPROEX SODIUM ER 250 MG PO TB24: 1000.0000 mg | ORAL_TABLET | Freq: Every day | ORAL | Status: DC

## 2015-08-05 MED ORDER — BECLOMETHASONE DIPROPIONATE 80 MCG/ACT IN AERS: 2.0000 | INHALATION_SPRAY | Freq: Two times a day (BID) | RESPIRATORY_TRACT | Status: DC

## 2015-08-05 MED ORDER — LEVOTHYROXINE SODIUM 50 MCG PO TABS: 50.0000 ug | ORAL_TABLET | Freq: Every day | ORAL | Status: DC

## 2015-08-05 MED ORDER — RIZATRIPTAN BENZOATE 10 MG PO TBDP: 10.0000 mg | ORAL_TABLET | Freq: Every day | ORAL | Status: DC | PRN

## 2015-08-05 MED ORDER — ALBUTEROL SULFATE HFA 108 (90 BASE) MCG/ACT IN AERS: 1.0000 | INHALATION_SPRAY | Freq: Every day | RESPIRATORY_TRACT | Status: DC | PRN

## 2015-08-08 DIAGNOSIS — F423 Hoarding disorder: Secondary | ICD-10-CM | POA: Diagnosis not present

## 2015-08-08 DIAGNOSIS — F3132 Bipolar disorder, current episode depressed, moderate: Secondary | ICD-10-CM | POA: Diagnosis not present

## 2015-08-08 DIAGNOSIS — F411 Generalized anxiety disorder: Secondary | ICD-10-CM | POA: Diagnosis not present

## 2015-08-13 ENCOUNTER — Ambulatory Visit: Payer: Self-pay | Admitting: Gastroenterology

## 2015-08-18 DIAGNOSIS — F423 Hoarding disorder: Secondary | ICD-10-CM | POA: Diagnosis not present

## 2015-08-18 DIAGNOSIS — F3132 Bipolar disorder, current episode depressed, moderate: Secondary | ICD-10-CM | POA: Diagnosis not present

## 2015-08-22 MED FILL — ?SERTRALINE HCL 100 MG TAB: 100 | 30 days supply | Qty: 30 | Fill #1

## 2015-08-22 MED FILL — GABAPENTIN 300 MG CAPSULE: 300 | 30 days supply | Qty: 90 | Fill #1

## 2015-08-22 MED FILL — ?RIZATRIPTAN 10MG ODT: 10 MG | 26 days supply | Qty: 9 | Fill #1

## 2015-08-22 MED FILL — OMEPRAZOLE DR 40 MG CAPSULE: 40 | 30 days supply | Qty: 30 | Fill #2

## 2015-08-25 MED FILL — !ESTRACE 0.01% CREAM: 0.01% | 24 days supply | Qty: 24 | Fill #5

## 2015-08-27 DIAGNOSIS — F423 Hoarding disorder: Secondary | ICD-10-CM | POA: Diagnosis not present

## 2015-08-27 DIAGNOSIS — F3132 Bipolar disorder, current episode depressed, moderate: Secondary | ICD-10-CM | POA: Diagnosis not present

## 2015-09-01 ENCOUNTER — Ambulatory Visit: Payer: Self-pay | Admitting: Neurology

## 2015-09-08 DIAGNOSIS — F423 Hoarding disorder: Secondary | ICD-10-CM | POA: Diagnosis not present

## 2015-09-08 DIAGNOSIS — F3132 Bipolar disorder, current episode depressed, moderate: Secondary | ICD-10-CM | POA: Diagnosis not present

## 2015-09-09 ENCOUNTER — Ambulatory Visit (INDEPENDENT_AMBULATORY_CARE_PROVIDER_SITE_OTHER): Payer: Medicare HMO | Admitting: Family Medicine

## 2015-09-09 ENCOUNTER — Encounter: Payer: Self-pay | Admitting: Family Medicine

## 2015-09-09 VITALS — BP 130/80 | HR 70 | Temp 98.3°F | Resp 12 | Ht 60.5 in | Wt 160.0 lb

## 2015-09-09 DIAGNOSIS — F3170 Bipolar disorder, currently in remission, most recent episode unspecified: Secondary | ICD-10-CM

## 2015-09-09 DIAGNOSIS — I10 Essential (primary) hypertension: Secondary | ICD-10-CM | POA: Diagnosis not present

## 2015-09-09 DIAGNOSIS — Z85828 Personal history of other malignant neoplasm of skin: Secondary | ICD-10-CM

## 2015-09-09 DIAGNOSIS — Z6830 Body mass index (BMI) 30.0-30.9, adult: Secondary | ICD-10-CM | POA: Diagnosis not present

## 2015-09-09 DIAGNOSIS — E039 Hypothyroidism, unspecified: Secondary | ICD-10-CM | POA: Diagnosis not present

## 2015-09-09 DIAGNOSIS — K219 Gastro-esophageal reflux disease without esophagitis: Secondary | ICD-10-CM | POA: Diagnosis not present

## 2015-09-09 DIAGNOSIS — J45909 Unspecified asthma, uncomplicated: Secondary | ICD-10-CM

## 2015-09-09 DIAGNOSIS — G4733 Obstructive sleep apnea (adult) (pediatric): Secondary | ICD-10-CM

## 2015-09-09 LAB — COMPREHENSIVE METABOLIC PANEL
ALT: 11 U/L (ref 0–35)
AST: 17 U/L (ref 0–37)
Albumin: 4.2 g/dL (ref 3.5–5.2)
Alkaline Phosphatase: 148 U/L — ABNORMAL HIGH (ref 39–117)
BILIRUBIN TOTAL: 0.4 mg/dL (ref 0.2–1.2)
BUN: 15 mg/dL (ref 6–23)
CHLORIDE: 105 meq/L (ref 96–112)
CO2: 28 meq/L (ref 19–32)
CREATININE: 1.03 mg/dL (ref 0.40–1.20)
Calcium: 9.9 mg/dL (ref 8.4–10.5)
GFR: 58.33 mL/min — AB (ref >60.00)
GLUCOSE: 97 mg/dL (ref 70–99)
Potassium: 4.2 mEq/L (ref 3.5–5.1)
Sodium: 143 mEq/L (ref 135–145)
Total Protein: 7.2 g/dL (ref 6.0–8.3)

## 2015-09-09 LAB — T4, FREE: Free T4: 1.04 ng/dL (ref 0.60–1.60)

## 2015-09-09 LAB — TSH: TSH: 0.6 u[IU]/mL (ref 0.35–4.50)

## 2015-09-09 NOTE — Patient Instructions (Addendum)
A few things to remember from today's visit:   Gastroesophageal reflux disease without esophagitis  Essential hypertension - Plan: Comprehensive metabolic panel  Obstructive sleep apnea - Plan: Ambulatory referral to Pulmonology  Hypothyroidism, unspecified hypothyroidism type - Plan: TSH, T4, free  Asthma, unspecified asthma severity, uncomplicated  BMI [id number] - Plan: Comprehensive metabolic panel  Hx of squamous cell carcinoma of skin - Plan: Ambulatory referral to Dermatology  Bipolar disorder in partial remission, most recent episode unspecified type [facility]) - Plan: Ambulatory referral to Psychiatry  -Keep appointment with gastroenterologist is Friday. -Low impact exercise, Weight Watchers recommended for weight loss. Referral to psychiatrist and dermatology will be arrange.  We have ordered labs or studies at this visit.  It can take up to 1-2 weeks for results and processing. IF results require follow up or explanation, we will call you with instructions. Clinically stable results will be released to your [facility]. If you have not heard from [place] or cannot find your results in [facility] in 2 weeks please contact our office at [phone].  If you are not yet signed up for [facility], please consider signing up  Please be sure medication list is accurate. If a new problem present, please set up appointment sooner than planned today.

## 2015-09-09 NOTE — Progress Notes (Signed)
HPI:   [name] is a 60 y.o. female, who is here today to establish care with me.  Former PCP: Dr Zack Seal.  Last preventive routine visit: over a year ago. Gyn exam about 2 years ago, negative pap smear with HPV +  Concerns today: referral to psychiatrist, GERD sx, skin lesion, wt gain. She has Hx of migraines, OA, bipolar disorder, insomnia,OSA, asthma,and thyroid disease among some.  Hypothyroidism: Dx about 20 years ago. She is currently on  Levothyroxine 50 mg daily. No cold/hot intolerance, no changes in bowel habits.  Asthma: She is currently on QVAR 80 mcg bid and Albuterol inh, she wonders if she needs to be on Singulair, which was recommended by a friend. She usually needs Albuterol inh 2-3 times per month. No nocturnal symptoms. Hx of allergic rhinitis on Flonase nasal spray and Zyrtec 10 mg.   Hx of OSA, she is not using CPAP, last sleep study over 2 years ago. + Fatigue.    GERD:  C/O "horrible" heartburn, occasional regurgitation. Symptoms are worse at night and with any type of food. She has tried Prilosec, Aciphex,Nexium, among some.  GI evaluation 06/09/15 and placed on Omeprazole, she wonders if she can take a higher dose. 03/2014 EGD.  + Hiatal hernia. F/U appt with GI 09/12/15.  Denies abdominal pain, nausea, vomiting, changes in bowel habits, blood in stool or melena.  -She takes Naproxen and Advil daily for joint pain. She has Hydrocodone-Acetaminophen on her med list, states that it is an old Rx, used to go to pain clinic and according to pt, she was confronted about + marijuana in urine tox , she denies illicit drug use.  Depression + bipolar disorder:  She would like another psychiatrist. She is following at [facility] currently. On disability due to depression. Lives alone.   "Spot" on left hand for several years, getting bigger, concerned about cancer. 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 No consistent  with sun screen, sun protection. Requesting referral to dermatologists, she used to follow annually.  FHx mother SCC.  Wt gain: About 50 pounds in the past 2 years. Follows a healthy diet. She does not exercise regularly.  She thinks it may be related to her thyroid.  Hx of HTN, manage with non pharmacologic treatement.   Review of Systems  Constitutional: Positive for fatigue and unexpected weight change. Negative for activity change, appetite change and fever.  HENT: Negative for mouth sores, nosebleeds and trouble swallowing.   Eyes: Negative for redness and visual disturbance.  Respiratory: Positive for apnea. Negative for cough, shortness of breath and wheezing.   Cardiovascular: Negative for chest pain, palpitations and leg swelling.  Gastrointestinal: Negative for abdominal pain, nausea and vomiting.       Negative for changes in bowel habits.  Endocrine: Negative for cold intolerance and heat intolerance.  Genitourinary: Negative for decreased urine volume, difficulty urinating and hematuria.  Musculoskeletal: Positive for arthralgias and back pain.  Skin: Negative for color change and rash.  Neurological: Positive for headaches. Negative for seizures, syncope, weakness and numbness.  Psychiatric/Behavioral: Positive for behavioral problems. Negative for confusion. The patient is nervous/anxious.       Current Outpatient Prescriptions on File Prior to Visit  Medication Sig Dispense Refill  . albuterol (PROVENTIL HFA;VENTOLIN HFA) 108 (90 Base) MCG/ACT inhaler Inhale 1-2 puffs into the lungs daily as needed for wheezing or shortness of breath. 54 Inhaler 3  . amitriptyline (ELAVIL) 25 MG tablet  Take 25 mg by mouth at bedtime.    Marland Kitchen anti-nausea (EMETROL) solution Take 30 mLs by mouth every 15 (fifteen) minutes as needed for nausea or vomiting.    . beclomethasone (QVAR) 80 MCG/ACT inhaler Inhale 2 puffs into the lungs 2 (two) times daily. 3 Inhaler 3  . bismuth  subsalicylate (PEPTO BISMOL) 262 MG chewable tablet Chew 524 mg by mouth as needed.    . cetirizine (ZYRTEC) 10 MG tablet Take 1 tablet (10 mg total) by mouth daily. 30 tablet 5  . clonazePAM (KLONOPIN) 0.5 MG tablet Take 0.5 mg by mouth at bedtime.    Marland Kitchen estradiol (ESTRACE) 0.1 MG/GM vaginal cream Place 1 Applicatorful vaginally at bedtime. 42.5 g 12  . fluticasone (FLONASE) 50 MCG/ACT nasal spray Place 2 sprays into both nostrils daily. 16 g 6  . gabapentin (NEURONTIN) 300 MG capsule Take 300 mg by mouth 3 (three) times daily.    . hydrocortisone cream 1 % Apply 1 application topically 2 (two) times daily.    . hydrOXYzine (ATARAX/VISTARIL) 25 MG tablet Take 25 mg by mouth at bedtime.    Marland Kitchen levothyroxine (SYNTHROID, LEVOTHROID) 50 MCG tablet Take 1 tablet (50 mcg total) by mouth daily. 90 tablet 3  . lithium carbonate 300 MG capsule Take 150 mg by mouth daily.     Marland Kitchen loperamide (IMODIUM A-D) 2 MG tablet Take 2 mg by mouth 4 (four) times daily as needed for diarrhea or loose stools.    . naproxen sodium (ANAPROX) 220 MG tablet Take 4 tablets (880 mg total) by mouth 2 (two) times daily as needed (headache). 60 tablet 5  . omeprazole (PRILOSEC) 40 MG capsule Take 1 capsule (40 mg total) by mouth daily. 90 capsule 3  . oxybutynin (DITROPAN) 5 MG tablet Take 2 tablets by mouth 3 (three) times daily.  11  . phenylephrine (SUDAFED PE) 10 MG TABS tablet Take 10 mg by mouth every 4 (four) hours as needed.    . rizatriptan (MAXALT-MLT) 10 MG disintegrating tablet Take 1 tablet (10 mg total) by mouth daily as needed for migraine. May repeat in 2 hours if needed. Do not exceed 2 doses in 1 week period 30 tablet 3  . sertraline (ZOLOFT) 100 MG tablet Take 2 tablets (200 mg total) by mouth daily. (Patient taking differently: Take 100 mg by mouth daily. ) 14 tablet 0  . ziprasidone (GEODON) 20 MG capsule Take 20 mg by mouth 2 (two) times daily with a meal.   2   No current facility-administered medications on  file prior to visit.      Past Medical History:  Diagnosis Date  . Allergy    seasonal  . Arthritis   . Asthma   . Bipolar 1 disorder ([place])   . Cancer ([place])    skin cancer to left ear  . Complication of anesthesia   . Depression   . GERD (gastroesophageal reflux disease)   . Headache(784.0)   . History of frequent urinary tract infections   . Hypertension   . Hypothyroidism   . Interstitial cystitis   . 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 Rosacea   . Shortness of breath   . Sleep apnea    has cpap, doesnt use, lost machine   . Thyroid disease    no present treatment 03-18-14   Allergies  Allergen Reactions  . Amlodipine Swelling    Peripheral edema  . Pseudoephedrine Other (See Comments)    I fly  off the walls   . Risperidone And Related Other (See Comments)    Stomach upset, insomnia, drooling, tremors, "jerks," sensitivity to touch.   . Aspirin Other (See Comments)    REACTION: upset stomach  . Bupivacaine Hcl Hives  . Codeine Itching  . Etodolac Rash  . Indocin [Indomethacin] Other (See Comments)    Muscle spasms  . Ivp Dye [Iodinated Diagnostic Agents] Other (See Comments)    Flushed and Fever, itch all over  . Pentazocine Lactate Itching    oozing  . Propoxyphene N-Acetaminophen Itching  . Propranolol Nausea Only  . Sulfonamide Derivatives Other (See Comments)    unknown  . Tramadol Hcl Other (See Comments)    unknown    Family History  Problem Relation Age of Onset  . Hypertension Mother     deceased from MVA complications  . Thyroid disease Mother   . Testicular cancer Father   . Colon polyps Sister   . Coronary artery disease      Social History   Social History  . Marital status: Divorced    Spouse name: N/A  . Number of children: 0  . Years of education: N/A   Social History Main Topics  . Smoking status: Never Smoker  . Smokeless tobacco: Never Used  . Alcohol use 0.0 oz/week     Comment: once every 3-4 months    . Drug use: No  . Sexual activity: Not Currently   Other Topics Concern  . None   Social History Narrative  . None    Vitals:   09/09/15 1257  BP: 130/80  Pulse: 70  Resp: 12  Temp: 98.3 F (36.8 C)    Body mass index is 30.73 kg/m.   O2 sat 98% at [facility].   Physical Exam  Nursing note and vitals reviewed. Constitutional: She is oriented to person, place, and time. She appears well-developed. No distress.  HENT:  Head: Atraumatic.  Mouth/Throat: Oropharynx is clear and moist and mucous membranes are normal. She has dentures.  Eyes: Conjunctivae and EOM are normal. Pupils are equal, round, and reactive to light.  Cardiovascular: Normal rate and regular rhythm.   No murmur heard. Respiratory: Effort normal and breath sounds normal. No respiratory distress.  GI: Soft. She exhibits no mass. There is no tenderness.  Musculoskeletal: She exhibits no edema.  Lymphadenopathy:    She has no cervical adenopathy.  Neurological: She is alert and oriented to person, place, and time. She has normal strength. Coordination normal.  Skin: Skin is warm. No erythema.     2-3 mm, slightly pigmented, rounded lesion dorsum of left hand.   Psychiatric: She has a normal mood and affect. Her speech is normal.  Well groomed, good eye contact.      ASSESSMENT AND PLAN:  Lab Results  Component Value Date   TSH 0.60 09/09/2015     Chemistry      Component Value Date/Time   NA 143 09/09/2015 1415   K 4.2 09/09/2015 1415   CL 105 09/09/2015 1415   CO2 28 09/09/2015 1415   BUN 15 09/09/2015 1415   CREATININE 1.03 09/09/2015 1415   CREATININE 1.00 11/21/2014 0959      Component Value Date/Time   CALCIUM 9.9 09/09/2015 1415   ALKPHOS 148 (H) 09/09/2015 1415   AST 17 09/09/2015 1415   ALT 11 09/09/2015 1415   BILITOT 0.4 09/09/2015 1415        [name] was seen today for new patient (initial visit).  Diagnoses and all orders for this visit:  Gastroesophageal reflux disease  without esophagitis  GERD precautions discussed. She has tried different PPI's with no major benefit. Some side effects of PPI's discussed. Strongly recommended avoiding NSAID's OTC. Keep appt with GI.  Obstructive sleep apnea  Adverse effects of OSA discussed. She is willing to try CPAP again but has not had a sleep study in over 2 years.   -     Ambulatory referral to Pulmonology  Asthma, unspecified asthma severity, uncomplicated  Well controlled. No changes in current management. F/U in 12 months.  Hypothyroidism, unspecified hypothyroidism type  No changes in current management, will follow labs done today and will give further recommendations accordingly. F/U in 12 months.  -     TSH -     T4, free  BMI 30.0-30.9,adult  We discussed benefits of wt loss as well as adverse effects of obesity. Consistency with healthy diet and physical activity recommended. Some of her medication can contribute to wt gain. Weight Watchers is a good option as well as daily brisk walking for 15-30 min as tolerated.  -     Comprehensive metabolic panel  Essential hypertension  Adequately controlled on non pharmacologic treatment.  DASH diet recommended. Eye exam recommended annually.   -     Comprehensive metabolic panel  Hx of squamous cell carcinoma of skin -     Ambulatory referral to Dermatology  Bipolar disorder in partial remission, most recent episode unspecified type [facility]) -     Ambulatory referral to Psychiatry       -I will see her back in 6 months for her routine physical.          Sam Wunschel G. Martinique, MD  [facility]. [place] office.

## 2015-09-09 NOTE — Progress Notes (Signed)
Pre visit review using our clinic review tool, if applicable. No additional management support is needed unless otherwise documented below in the visit note. 

## 2015-09-10 DIAGNOSIS — F411 Generalized anxiety disorder: Secondary | ICD-10-CM | POA: Diagnosis not present

## 2015-09-12 ENCOUNTER — Encounter (INDEPENDENT_AMBULATORY_CARE_PROVIDER_SITE_OTHER): Payer: Self-pay

## 2015-09-12 ENCOUNTER — Encounter: Payer: Self-pay | Admitting: Gastroenterology

## 2015-09-12 ENCOUNTER — Ambulatory Visit (INDEPENDENT_AMBULATORY_CARE_PROVIDER_SITE_OTHER): Payer: Medicare HMO | Admitting: Gastroenterology

## 2015-09-12 ENCOUNTER — Other Ambulatory Visit: Payer: Self-pay

## 2015-09-12 VITALS — BP 120/70 | HR 83 | Ht 60.05 in | Wt 160.0 lb

## 2015-09-12 DIAGNOSIS — K219 Gastro-esophageal reflux disease without esophagitis: Secondary | ICD-10-CM | POA: Diagnosis not present

## 2015-09-12 DIAGNOSIS — K5901 Slow transit constipation: Secondary | ICD-10-CM | POA: Diagnosis not present

## 2015-09-12 NOTE — Patient Instructions (Signed)
You have been scheduled for a 24 Hour PH Probe test at [facility] on             at             Please arrive 30 minutes prior to your procedure for registration. You will need to go to outpatient registration (1st floor of the hospital) first. Make certain to bring your insurance cards as well as a complete list of medications.  Please remember the following:  1) Nothing to eat or drink after 12:00 midnight on the night before your test.  2) Hold all diabetic medications/insulin the morning of the test. You may eat and take             your medications after the test.  3) For 3 days prior to your test do not take: Dexilant, Prevacid, Nexium, Protonix,         Aciphex, Zegerid, Pantoprazole, Prilosec or omeprazole.  4) For 2 days prior to your test, do not take: Reglan, Tagamet, Zantac, Axid or Pepcid.  5) You MAY use an antacid such as Rolaids or Tums up to 12 hours prior to your test.  It will take at least 2 weeks to receive the results of this test from your physician. ------------------------------------------ ABOUT 24 HOUR [place] PROBE An esophageal pH test measures and records the pH in your esophagus to determine if you have gastroesophageal reflux disease (GERD). The test can also be done to determine the effectiveness of medications or surgical treatment for GERD. What is esophageal reflux? Esophageal reflux is a condition in which stomach acid refluxes or moves back into the esophagus (the "food pipe" leading from the mouth to the stomach). How does the esophageal pH test work? A thin, small tube with an acid sensing device on the tip is gently passed through your nose, down the esophagus ("food tube"), and positioned about 2 inches above the lower esophageal sphincter. The tube is secured to the side of your face with clear tape. The end of the tube exiting from your nose is attached to a portable recorder that is worn on your belt or over your shoulder. The recorder has  several buttons on it that you will press to mark certain events. A nurse will review the monitoring instructions with you. Once the test has begun, what do I need to know and do? Activity: Follow your usual daily routine. Do not reduce or change your activities during the monitoring period. Doing so can make the monitoring results less useful.  Note: do not take a tub bath or shower; the equipment can't get wet.  Eating: Eat your regular meals at the usual times. If you do not eat during the monitoring period, your stomach will not produce acid as usual, and the test results will not be accurate. Eat at least 2 meals a day. Eat foods that tend to increase your symptoms (without making yourself miserable). Avoid snacking. Do not suck on hard candy or lozenges and do not chew gum during the monitoring period.  Lying down: Remain upright throughout the day. Do not lie down until you go to bed (unless napping or lying down during the day is part of your daily routine).  Medications: Continue to follow your doctor's advice regarding medications to avoid during the monitoring period.  Recording symptoms: Press the appropriate button on your recorder when symptoms occur (as discussed with the nurse).  Recording events: Record the time you start and stop eating  and drinking (anything other than plain water). Record the time you lie down (even if just resting) and when you get back up. The nurse will explain this.  Unusual symptoms or side effects. If you think you may be experiencing any unusual symptoms or side effects, call your doctor.  You will return the next day to have the tube removed. The information on the recorder will be downloaded to a computer and the results will be analyzed.  After completion of the study Resume your normal diet and medications. Lozenges or hard candy may help ease any sore throat caused by the tube.   If you are age 78 or older, your body mass index should be between 23-30.  Your Body mass index is 31.2 kg/m. If this is out of the aforementioned range listed, please consider follow up with your Primary Care Provider.  If you are age 59 or younger, your body mass index should be between 19-25. Your Body mass index is 31.2 kg/m. If this is out of the aformentioned range listed, please consider follow up with your Primary Care Provider.   Thank you for choosing [facility]  Dr Wilfrid Lund III

## 2015-09-12 NOTE — Progress Notes (Signed)
[facility] Progress Note  Chief Complaint: GERD and constipation  Subjective  History:  [name] has persistent heartburn and regurgitation both daytime and night despite taking omeprazole 40 mg daily. She had no improvement with the dose was increased to twice daily after seeing Dr. Olevia Perches for an endoscopy 18 months ago. She has also had a recent constipation. Usually she has had IBS related diarrhea. Despite that, she had a suboptimal bowel preparation on colonoscopy 18 months ago. For the last few weeks she has had nothing but small pellet-like stools. She denies rectal bleeding nausea or vomiting. She complains of some right upper quadrant tenderness from time to time and it is not clearly related to position time of day eating or bowel movements  ROS: Cardiovascular:  no chest pain Respiratory: no dyspnea  The patient's Past Medical, Family and Social History were reviewed and are on file in the EMR.  Objective:  Med list reviewed  Vital signs in last 24 hrs: Vitals:   09/12/15 1613  BP: 120/70  Pulse: 83    Physical Exam   HEENT: sclera anicteric, oral mucosa moist without lesions  Neck: supple, no thyromegaly, JVD or lymphadenopathy  Cardiac: RRR without murmurs, S1S2 heard, no peripheral edema  Pulm: clear to auscultation bilaterally, normal RR and effort noted  Abdomen: soft, No tenderness, with active bowel sounds. No guarding or palpable hepatosplenomegaly.  Skin; warm and dry, no jaundice or rash She has tenderness over the right anterior and lateral lower rib cage    @ASSESSMENTPLANBEGIN @ Assessment: Encounter Diagnoses  Name Primary?  . Gastroesophageal reflux disease, esophagitis presence not specified Yes  . Slow transit constipation     She has nonerosive reflux disease, and I suspect a significant element of visceral hypersensitivity. There was a hiatal hernia on last EGD. She has IBS that has lately cause constipation. No structural or obstructive  causes on colonoscopy.  Plan: Esophageal pH/impedance testing to quantify acid versus nonacid reflux and see if she is a good candidate for fundoplication. She is already on a TCA  She says that she is unable to afford a laxative at this time, so we gave her a sample of 1 bottle Suprep to mix 50-50 with water and then drink 2 cups of water afterwards sometime this week and to relieve her constipation.   Total time 25 minutes, over half spent in counseling and coordination of care.  Topics discussed: Workup of reflux, indications for surgery, and treatment of IBS.  [name]

## 2015-09-15 ENCOUNTER — Ambulatory Visit: Payer: Medicare Other | Admitting: Physical Medicine & Rehabilitation

## 2015-09-16 ENCOUNTER — Telehealth: Payer: Self-pay | Admitting: Family Medicine

## 2015-09-16 DIAGNOSIS — F3132 Bipolar disorder, current episode depressed, moderate: Secondary | ICD-10-CM | POA: Diagnosis not present

## 2015-09-16 DIAGNOSIS — F423 Hoarding disorder: Secondary | ICD-10-CM | POA: Diagnosis not present

## 2015-09-16 NOTE — Telephone Encounter (Signed)
Patient Name: AVAMAE HAUSKINS DOB: 04-06-1955 Initial Comment Caller states her cat scratched her yesterday. She has redness, tenderness at the scratch area. Top of her hand. Swelling. Nurse Assessment Nurse: Ronnald Ramp, RN, Miranda Date/Time (Eastern Time): 09/16/2015 2:03:40 PM Confirm and document reason for call. If symptomatic, describe symptoms. You must click the next button to save text entered. ---Caller states her cat scratched her on the left hand yesterday. The area is red, tender, and swollen. Has the patient traveled out of the country within the last 30 days? ---Not Applicable Does the patient have any new or worsening symptoms? ---Yes Will a triage be completed? ---Yes Related visit to physician within the last 2 weeks? ---No Does the PT have any chronic conditions? (i.e. diabetes, asthma, etc.) ---Yes List chronic conditions. ---Thyroid, Bipolar, Arthritis, Asthma, Allergies, Migraines, GERD, IC Is this a behavioral health or substance abuse call? ---No Guidelines Guideline Title Affirmed Question Affirmed Notes Animal Bite Cut that's too small to irrigate (<1/8 inch or 3 mm) (all triage questions negative) Final Disposition User Roscoe, RN, Miranda Comments Appt scheduled for tomorrow at 3:45pm with Dorothyann Peng, NP Referrals REFERRED TO PCP OFFICE Disagree/Comply: Comply

## 2015-09-16 NOTE — Telephone Encounter (Signed)
Appointment with you tomorrow at 3:45.

## 2015-09-17 ENCOUNTER — Ambulatory Visit (INDEPENDENT_AMBULATORY_CARE_PROVIDER_SITE_OTHER): Payer: Medicare HMO | Admitting: Adult Health

## 2015-09-17 DIAGNOSIS — W5503XA Scratched by cat, initial encounter: Secondary | ICD-10-CM

## 2015-09-17 DIAGNOSIS — T148 Other injury of unspecified body region: Secondary | ICD-10-CM | POA: Diagnosis not present

## 2015-09-17 NOTE — Progress Notes (Signed)
   Subjective:    Patient ID: [name], female    DOB: 23-Aug-1955, 60 y.o.   MRN: [id number]  HPI  60 year old female who presents to the office today for a cat scratch to her left hand that happened three days ago. The cat was her cat and is up to date on vaccinations. She reports swelling, redness and pain to her left pointer finger and left top of hand.   She has been applying triple antibiotic cream and bandaid  Denies any fevers or streaking up arm  Review of Systems  Constitutional: Negative.   Respiratory: Negative.   Cardiovascular: Negative.   Skin: Positive for color change and wound.  All other systems reviewed and are negative.      Objective:   Physical Exam  Constitutional: She is oriented to person, place, and time. She appears well-developed and well-nourished. No distress.  Cardiovascular: Normal rate, regular rhythm, normal heart sounds and intact distal pulses.  Exam reveals no gallop and no friction rub.   No murmur heard. Pulmonary/Chest: Effort normal and breath sounds normal. No respiratory distress. She has no wheezes. She has no rales. She exhibits no tenderness.  Musculoskeletal: Normal range of motion. She exhibits edema and tenderness. She exhibits no deformity.  Neurological: She is alert and oriented to person, place, and time.  Skin: Skin is warm and dry. No rash noted. She is not diaphoretic. There is erythema. No pallor.  Small scratch marks on left pointer finger and left distal part of hand. There is noticeable swelling and localized erythema. Pain with palpation across left hand.   Psychiatric: She has a normal mood and affect. Her behavior is normal. Judgment and thought content normal.  Nursing note and vitals reviewed.      Assessment & Plan:  1. Cat scratch - Localized infection  - Paper prescription given for Z pack  - Continue to apply neosporin  - Follow up in 2-3 days of no improvement or sooner if needed  Dorothyann Peng, NP

## 2015-09-22 ENCOUNTER — Encounter (HOSPITAL_COMMUNITY)
Admission: RE | Disposition: A | Discharge status: Home / Self Care | Payer: Self-pay | Source: Ambulatory Visit | Attending: Gastroenterology

## 2015-09-22 ENCOUNTER — Ambulatory Visit (HOSPITAL_COMMUNITY)
Admission: RE | Admit: 2015-09-22 | Discharge: 2015-09-22 | Disposition: A | Discharge status: Home / Self Care | Payer: Medicare HMO | Source: Ambulatory Visit | Attending: Gastroenterology | Admitting: Gastroenterology

## 2015-09-22 DIAGNOSIS — K219 Gastro-esophageal reflux disease without esophagitis: Secondary | ICD-10-CM | POA: Diagnosis not present

## 2015-09-22 HISTORY — PX: ESOPHAGEAL MANOMETRY: SHX5429

## 2015-09-22 HISTORY — PX: 24 HOUR PH STUDY: SHX5419

## 2015-09-22 SURGERY — MANOMETRY, ESOPHAGUS

## 2015-09-22 MED ORDER — LIDOCAINE VISCOUS 2 % MT SOLN
OROMUCOSAL | Status: AC
  Filled 2015-09-22: qty 15

## 2015-09-22 SURGICAL SUPPLY — 2 items
FACESHIELD LNG OPTICON STERILE (SAFETY) IMPLANT
GLOVE BIO SURGEON STRL SZ8 (GLOVE) ×4 IMPLANT

## 2015-09-23 ENCOUNTER — Encounter (HOSPITAL_COMMUNITY): Payer: Self-pay | Admitting: Gastroenterology

## 2015-09-25 ENCOUNTER — Ambulatory Visit (INDEPENDENT_AMBULATORY_CARE_PROVIDER_SITE_OTHER): Payer: Medicare HMO | Admitting: Pulmonary Disease

## 2015-09-25 ENCOUNTER — Other Ambulatory Visit (INDEPENDENT_AMBULATORY_CARE_PROVIDER_SITE_OTHER): Payer: Medicare HMO

## 2015-09-25 ENCOUNTER — Encounter: Payer: Self-pay | Admitting: Pulmonary Disease

## 2015-09-25 VITALS — BP 142/88 | HR 97 | Ht 60.5 in | Wt 155.0 lb

## 2015-09-25 DIAGNOSIS — R5383 Other fatigue: Secondary | ICD-10-CM

## 2015-09-25 DIAGNOSIS — G4733 Obstructive sleep apnea (adult) (pediatric): Secondary | ICD-10-CM

## 2015-09-25 LAB — CBC WITH DIFFERENTIAL/PLATELET
BASOS ABS: 0 10*3/uL (ref 0.0–0.1)
BASOS PCT: 0.2 % (ref 0.0–3.0)
EOS ABS: 0.2 10*3/uL (ref 0.0–0.7)
Eosinophils Relative: 1.5 % (ref 0.0–5.0)
HCT: 37.4 % (ref 36.0–46.0)
Hemoglobin: 12.4 g/dL (ref 12.0–15.0)
LYMPHS ABS: 3.2 10*3/uL (ref 0.7–4.0)
Lymphocytes Relative: 27 % (ref 12.0–46.0)
MCHC: 33.3 g/dL (ref 30.0–36.0)
MCV: 82.4 fl (ref 78.0–100.0)
MONO ABS: 1 10*3/uL (ref 0.1–1.0)
Monocytes Relative: 8.4 % (ref 3.0–12.0)
NEUTROS ABS: 7.5 10*3/uL (ref 1.4–7.7)
NEUTROS PCT: 62.9 % (ref 43.0–77.0)
PLATELETS: 313 10*3/uL (ref 150.0–400.0)
RBC: 4.53 Mil/uL (ref 3.87–5.11)
RDW: 15.3 % (ref 11.5–15.5)
WBC: 11.9 10*3/uL — ABNORMAL HIGH (ref 4.0–10.5)

## 2015-09-25 MED ORDER — MONTELUKAST SODIUM 10 MG PO TABS: 10.0000 mg | ORAL_TABLET | Freq: Every day | ORAL | 11 refills | Status: DC

## 2015-09-25 NOTE — Assessment & Plan Note (Signed)
Hypersomnolence/ OSA Plan: We will schedule a home sleep study. Continue to work on weight loss, as the link between excess weight  and sleep apnea is well established.  Do not drive if sleepy. Follow up with Dr. Corrie Dandy or NP In 6-8 weeks  or before as needed.  Please contact office for sooner follow up if symptoms do not improve or worsen or seek emergency care

## 2015-09-25 NOTE — Progress Notes (Signed)
Subjective:    Patient ID: [name], female    DOB: 03/08/1955, 60 y.o.   MRN: [id number]  HPI  Pt. Presents to the office for self referral for OSA. She had previously been followed by Dr. Annamaria Boots in  the early 2000's  for OSA, with  CPAP, 3-4 years ago she lost the cord to her machine, so she stopped using the device. She has recently gained 45 pounds and is concerned that her OSA is most likely worse.She wishes to resume CPAP treatment. She goes to bed at between 12:30 am to 130 am. It takes about 1-2 hours to fall asleep.She gets up between 9am and 10 am. She does not operate heavy machinery, she does not have a Teacher, adult education. She did have a sleep study in the past per Dr. Annamaria Boots.Epworth Score is 5. She does have seasonal allergies, and environmental allergies. She does take Zyrtec generic, and phenylephrine.There is no family history of sleep apnea that the family is aware of.Pt. States she has fatigue and daytime sleepiness. Never feels like she gets enough sleep. She always wakes up tired.   Current Outpatient Prescriptions:  .  albuterol (PROVENTIL HFA;VENTOLIN HFA) 108 (90 Base) MCG/ACT inhaler, Inhale 1-2 puffs into the lungs daily as needed for wheezing or shortness of breath., Disp: 54 Inhaler, Rfl: 3 .  amitriptyline (ELAVIL) 25 MG tablet, Take 25 mg by mouth at bedtime., Disp: , Rfl:  .  anti-nausea (EMETROL) solution, Take 30 mLs by mouth every 15 (fifteen) minutes as needed for nausea or vomiting., Disp: , Rfl:  .  beclomethasone (QVAR) 80 MCG/ACT inhaler, Inhale 2 puffs into the lungs 2 (two) times daily., Disp: 3 Inhaler, Rfl: 3 .  cetirizine (ZYRTEC) 10 MG tablet, Take 1 tablet (10 mg total) by mouth daily., Disp: 30 tablet, Rfl: 5 .  clonazePAM (KLONOPIN) 0.5 MG tablet, Take 0.5 mg by mouth at bedtime., Disp: , Rfl:  .  estradiol (ESTRACE) 0.1 MG/GM vaginal cream, Place 1 Applicatorful vaginally at bedtime., Disp: 42.5 g, Rfl: 12 .  fluticasone (FLONASE) 50  MCG/ACT nasal spray, Place 2 sprays into both nostrils daily., Disp: 16 g, Rfl: 6 .  gabapentin (NEURONTIN) 300 MG capsule, Take 300 mg by mouth 3 (three) times daily., Disp: , Rfl:  .  HYDROcodone-acetaminophen (NORCO/VICODIN) 5-325 MG tablet, Take 1 tablet by mouth every 6 (six) hours as needed for moderate pain., Disp: , Rfl:  .  hydrocortisone cream 1 %, Apply 1 application topically 2 (two) times daily., Disp: , Rfl:  .  hydrOXYzine (ATARAX/VISTARIL) 25 MG tablet, Take 25 mg by mouth at bedtime., Disp: , Rfl:  .  ibuprofen (ADVIL,MOTRIN) 200 MG tablet, Take 200 mg by mouth every 6 (six) hours as needed. Take 2-4 tablets every 4 hours as needed, Disp: , Rfl:  .  levothyroxine (SYNTHROID, LEVOTHROID) 50 MCG tablet, Take 1 tablet (50 mcg total) by mouth daily., Disp: 90 tablet, Rfl: 3 .  lithium carbonate 300 MG capsule, Take 150 mg by mouth daily. , Disp: , Rfl:  .  loperamide (IMODIUM A-D) 2 MG tablet, Take 2 mg by mouth 4 (four) times daily as needed for diarrhea or loose stools., Disp: , Rfl:  .  naproxen sodium (ANAPROX) 220 MG tablet, Take 4 tablets (880 mg total) by mouth 2 (two) times daily as needed (headache)., Disp: 60 tablet, Rfl: 5 .  omeprazole (PRILOSEC) 40 MG capsule, Take 1 capsule (40 mg total) by mouth daily., Disp: 90 capsule, Rfl: 3 .  oxybutynin (DITROPAN) 5 MG tablet, Take 2 tablets by mouth 3 (three) times daily., Disp: , Rfl: 11 .  phenylephrine (SUDAFED PE) 10 MG TABS tablet, Take 10 mg by mouth every 4 (four) hours as needed., Disp: , Rfl:  .  rizatriptan (MAXALT-MLT) 10 MG disintegrating tablet, Take 1 tablet (10 mg total) by mouth daily as needed for migraine. May repeat in 2 hours if needed. Do not exceed 2 doses in 1 week period, Disp: 30 tablet, Rfl: 3 .  sertraline (ZOLOFT) 100 MG tablet, Take 2 tablets (200 mg total) by mouth daily. (Patient taking differently: Take 100 mg by mouth daily. ), Disp: 14 tablet, Rfl: 0 .  ziprasidone (GEODON) 20 MG capsule, Take 20 mg  by mouth 2 (two) times daily with a meal. , Disp: , Rfl: 2   Past Medical History:  Diagnosis Date  . Allergy    seasonal  . Arthritis   . Asthma   . Bipolar 1 disorder ([place])   . 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 Complication of anesthesia   . Depression   . GERD (gastroesophageal reflux disease)   . Headache(784.0)   . History of frequent urinary tract infections   . Hypertension   . Hypothyroidism   . Interstitial cystitis   . 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 Rosacea   . Shortness of breath   . Sleep apnea    has cpap, doesnt use, lost machine   . Thyroid disease    no present treatment 03-18-14   Social History   Social History  . Marital status: Divorced    Spouse name: N/A  . Number of children: 0  . Years of education: N/A   Social History Main Topics  . Smoking status: Never Smoker  . Smokeless tobacco: Never Used  . Alcohol use 0.0 oz/week     Comment: once every 3-4 months  . Drug use: No  . Sexual activity: Not Currently   Other Topics Concern  . None   Social History Narrative  . None    family history includes Colon polyps in her sister; Hypertension in her mother; Testicular cancer in her father; Thyroid disease in her mother.   Review of Systems  Constitutional: Negative for chills, fever and unexpected weight change.  HENT: Negative for congestion, dental problem, ear pain, nosebleeds, postnasal drip, rhinorrhea, sinus pressure, sneezing, sore throat, trouble swallowing and voice change.   Eyes: Negative for visual disturbance.  Respiratory: Negative for cough, choking and shortness of breath.   Cardiovascular: Negative for chest pain and leg swelling.  Gastrointestinal: Negative for abdominal pain, diarrhea and vomiting.  Genitourinary: Negative for difficulty urinating.  Musculoskeletal: Negative for arthralgias.  Skin: Negative for rash.  Neurological: Negative for tremors, syncope and headaches.    Hematological: Does not bruise/bleed easily.       Objective:   Physical Exam  BP (!) 142/88 (BP Location: Left Arm, Cuff Size: Normal)   Pulse 97   Ht 5' 0.5" (1.537 m)   Wt 155 lb (70.3 kg)   SpO2 97%   BMI 29.77 kg/m   Physical Exam:  General- No distress,  A&Ox3, overweight for her frame ENT: No sinus tenderness, TM clear, pale nasal mucosa, no oral exudate,+ post nasal drip, no LAN Cardiac: S1, S2, regular rate and rhythm, no murmur Chest: No wheeze/ rales/ dullness; no accessory muscle use, no nasal flaring, no sternal retractions Abd.: Soft Non-tender Ext:  No clubbing cyanosis, edema Neuro:  normal strength Skin: No rashes, warm and dry Psych: normal mood and behavior     Assessment & Plan:   Hypersomnolence/ OSA Plan: We will schedule a home sleep study. Continue to work on weight loss, as the link between excess weight  and sleep apnea is well established.  Do not drive if sleepy. Please contact office for sooner follow up if symptoms do not improve or worsen or seek emergency care   Magdalen Spatz, AGACNP-BC [place] 09/25/2015   ATTENDING NOTE / ATTESTATION NOTE :   I have discussed the case with the resident/APP Eric Form.  I agree with the resident/APP's  history, physical examination, assessment, and plans.    I have edited the above note and modified it according to our agreed history, physical examination, assessment and plan.   Briefly, pt with known OSA but has lost the cord to her CPAP so she has not been using CPAP for several yrs now.  More symptomatic now. Has hypersomnia affecting fxnality. Has snoring, witnessed apneas, fatigue, gasping, choking. Also, has asthma which is stable. Takes qvar. Has sinus allergies as well which are mildly flared up. PE as above. Narrow airway, mild retrognathia. (-) wheezing.   Assessment and Plan : 1. OSA.  We discussed about the diagnosis of Obstructive Sleep Apnea (OSA)  and implications of untreated OSA. We discussed about CPAP and BiPaP as possible treatment options.    We will schedule the patient for a sleep study. Plan for a HST. She had issues before with lab studies. Had to frequently go to the bathroom. Anticipate no issues with CPAP as she tolerated it before. (Has bipolar disorder).    Patient was instructed to call the office if he/she has not heard back from the office 1-2 weeks after the sleep study.   Patient was instructed to call the office if he/she is having issues with the PAP device.   We discussed good sleep hygiene.   Patient was advised not to engage in activities requiring concentration and/or vigilance if he/she is sleepy.  Patient was advised not to drive if he/she is sleepy.   2. Asthma. Seems stable. Cont QVAR 80 mcg, 2 P BID. Will need a spacer. Cont alb prn.  3. Allergic Sinusitis. Start singulair. Cont zyrtec. She takes phenylephrine daily. I told her to wean off if possible.   Return to clinic in 6-8 weeks.   Monica Becton, MD 09/26/2015, 4:30 AM [facility] Pager [phone] After 3 pm or if no answer, call [phone]

## 2015-09-25 NOTE — Patient Instructions (Addendum)
It is nice to meet you today. We will schedule a sleep study. Continue to work on weight loss, as the link between excess weight  and sleep apnea is well established.  Do not drive if sleepy.   We will get a home sleep test.  You will be instructed to come back to the office to get an apparatus to sleep with overnight.  Once we have the apparatus, it will usually take [place] 1-2 weeks to read the study and get back at you with results of the test.  Please give [place] a call in 2 weeks after your study if you do not hear back from [place].    If the sleep study is positive, we will order you a CPAP  machine.  Please call the office if you do NOT receive your machine in the next 1-2 weeks.   Please make sure you use your CPAP device everytime you sleep.  We will monitor the usage of your machine per your insurance requirement.  Your insurance company may take the machine from you if you are not using it regularly.   Please clean the mask, tubings, filter, water reservoir with soapy water every week.  Please use distilled water for the water reservoir.   Please call the office or your machine provider (DME company) if you are having issues with the device.   We will do blood work today. Also order Singulair, 10 mg per tablet, 1 tablet daily at bedtime.  Return to clinic in 6-8 weeks with Dr. Corrie Dandy or NP

## 2015-10-01 DIAGNOSIS — F3132 Bipolar disorder, current episode depressed, moderate: Secondary | ICD-10-CM | POA: Diagnosis not present

## 2015-10-01 DIAGNOSIS — F423 Hoarding disorder: Secondary | ICD-10-CM | POA: Diagnosis not present

## 2015-10-08 DIAGNOSIS — F423 Hoarding disorder: Secondary | ICD-10-CM | POA: Diagnosis not present

## 2015-10-08 DIAGNOSIS — F3132 Bipolar disorder, current episode depressed, moderate: Secondary | ICD-10-CM | POA: Diagnosis not present

## 2015-10-09 ENCOUNTER — Telehealth: Payer: Self-pay | Admitting: Family Medicine

## 2015-10-09 MED FILL — ?CETIRIZINE HCL 10 MG TABLE: 10 | 30 days supply | Qty: 30 | Fill #2

## 2015-10-09 MED FILL — LEVOTHYROXINE 50 MCG TABLET: 50 | 30 days supply | Qty: 30 | Fill #3 | Status: TO

## 2015-10-09 MED FILL — ?SERTRALINE HCL 100 MG TAB: 100 | 30 days supply | Qty: 30 | Fill #2

## 2015-10-09 NOTE — Telephone Encounter (Signed)
Pt was called on 8/31 and informed of paperwork being ready for pick up.

## 2015-10-09 NOTE — Telephone Encounter (Signed)
Pt came by the office to drop off documents for PCP to fill out. I left it in her mailbox. Please follow up.  Thank you.

## 2015-10-14 MED FILL — ?FAMOTIDINE 20 MG TABLET: 20 | 30 days supply | Qty: 60 | Fill #2

## 2015-10-14 MED FILL — !ESTRACE 0.01% CREAM: 0.01% | 24 days supply | Qty: 24 | Fill #6

## 2015-10-15 ENCOUNTER — Telehealth: Payer: Self-pay | Admitting: Pulmonary Disease

## 2015-10-15 DIAGNOSIS — G4733 Obstructive sleep apnea (adult) (pediatric): Secondary | ICD-10-CM | POA: Diagnosis not present

## 2015-10-15 NOTE — Telephone Encounter (Signed)
  Please call the pt and tell the pt the [place]  showed OSA.   Pt stops breathing  13  times an hour.   Home sleep study was done on : 10/15/15  Please order autoCPAP 5-15 cm H2O. Patient will need a mask fitting session. Patient will need a 1 month download.   Patient needs to be seen by me or any of the NPs/APPs  4-6 weeks after obtaining the cpap machine. Let me know if you receive this.   Thanks!   J. Shirl Harris, MD 10/15/2015, 1:31 PM

## 2015-10-16 DIAGNOSIS — G4733 Obstructive sleep apnea (adult) (pediatric): Secondary | ICD-10-CM | POA: Diagnosis not present

## 2015-10-16 NOTE — Telephone Encounter (Signed)
Spoke with pt and gave results and recommendations. Pt agrees to start CPAP therapy. Order placed. Pt aware to call office and schedule f/u appt once starts CPAP. Nothing further needed.  

## 2015-10-16 NOTE — Addendum Note (Signed)
Addended by: Beckie Busing on: 10/16/2015 04:24 PM   Modules accepted: Orders

## 2015-10-17 ENCOUNTER — Other Ambulatory Visit: Payer: Self-pay | Admitting: *Deleted

## 2015-10-17 DIAGNOSIS — G4733 Obstructive sleep apnea (adult) (pediatric): Secondary | ICD-10-CM

## 2015-10-21 ENCOUNTER — Telehealth: Payer: Self-pay | Admitting: Gastroenterology

## 2015-10-21 NOTE — Telephone Encounter (Signed)
Report is in EPIC under procedures 

## 2015-10-22 DIAGNOSIS — N3010 Interstitial cystitis (chronic) without hematuria: Secondary | ICD-10-CM | POA: Diagnosis not present

## 2015-10-24 ENCOUNTER — Other Ambulatory Visit: Payer: Self-pay

## 2015-10-24 MED ORDER — OMEPRAZOLE 40 MG PO CPDR: 40.0000 mg | DELAYED_RELEASE_CAPSULE | Freq: Every day | ORAL | 3 refills | Status: DC

## 2015-10-27 ENCOUNTER — Telehealth: Payer: Self-pay | Admitting: Pulmonary Disease

## 2015-10-27 NOTE — Telephone Encounter (Signed)
Per Jeani Hawking @ [facility] they never received order. [facility] is working on this.   Informed pt that order is in process and she should hear from [facility] soon. Nothing further needed.

## 2015-10-28 DIAGNOSIS — F3132 Bipolar disorder, current episode depressed, moderate: Secondary | ICD-10-CM | POA: Diagnosis not present

## 2015-10-28 DIAGNOSIS — F411 Generalized anxiety disorder: Secondary | ICD-10-CM | POA: Diagnosis not present

## 2015-10-28 DIAGNOSIS — F423 Hoarding disorder: Secondary | ICD-10-CM | POA: Diagnosis not present

## 2015-10-31 DIAGNOSIS — W540XXA Bitten by dog, initial encounter: Secondary | ICD-10-CM | POA: Diagnosis not present

## 2015-10-31 DIAGNOSIS — S61552A Open bite of left wrist, initial encounter: Secondary | ICD-10-CM | POA: Diagnosis not present

## 2015-10-31 DIAGNOSIS — S61532A Puncture wound without foreign body of left wrist, initial encounter: Secondary | ICD-10-CM | POA: Diagnosis not present

## 2015-11-02 DIAGNOSIS — W540XXD Bitten by dog, subsequent encounter: Secondary | ICD-10-CM | POA: Diagnosis not present

## 2015-11-02 DIAGNOSIS — S61552D Open bite of left wrist, subsequent encounter: Secondary | ICD-10-CM | POA: Diagnosis not present

## 2015-11-04 DIAGNOSIS — F423 Hoarding disorder: Secondary | ICD-10-CM | POA: Diagnosis not present

## 2015-11-04 DIAGNOSIS — F3132 Bipolar disorder, current episode depressed, moderate: Secondary | ICD-10-CM | POA: Diagnosis not present

## 2015-11-04 DIAGNOSIS — F411 Generalized anxiety disorder: Secondary | ICD-10-CM | POA: Diagnosis not present

## 2015-11-10 DIAGNOSIS — F411 Generalized anxiety disorder: Secondary | ICD-10-CM | POA: Diagnosis not present

## 2015-11-10 DIAGNOSIS — F423 Hoarding disorder: Secondary | ICD-10-CM | POA: Diagnosis not present

## 2015-11-10 DIAGNOSIS — F3132 Bipolar disorder, current episode depressed, moderate: Secondary | ICD-10-CM | POA: Diagnosis not present

## 2015-11-12 DIAGNOSIS — G4733 Obstructive sleep apnea (adult) (pediatric): Secondary | ICD-10-CM | POA: Diagnosis not present

## 2015-11-17 DIAGNOSIS — F423 Hoarding disorder: Secondary | ICD-10-CM | POA: Diagnosis not present

## 2015-11-17 DIAGNOSIS — F3132 Bipolar disorder, current episode depressed, moderate: Secondary | ICD-10-CM | POA: Diagnosis not present

## 2015-11-25 DIAGNOSIS — F423 Hoarding disorder: Secondary | ICD-10-CM | POA: Diagnosis not present

## 2015-11-25 DIAGNOSIS — F3132 Bipolar disorder, current episode depressed, moderate: Secondary | ICD-10-CM | POA: Diagnosis not present

## 2015-12-01 ENCOUNTER — Ambulatory Visit: Payer: Self-pay | Admitting: Pulmonary Disease

## 2015-12-01 DIAGNOSIS — R69 Illness, unspecified: Secondary | ICD-10-CM | POA: Diagnosis not present

## 2015-12-02 DIAGNOSIS — F423 Hoarding disorder: Secondary | ICD-10-CM | POA: Diagnosis not present

## 2015-12-02 DIAGNOSIS — F411 Generalized anxiety disorder: Secondary | ICD-10-CM | POA: Diagnosis not present

## 2015-12-02 DIAGNOSIS — F3132 Bipolar disorder, current episode depressed, moderate: Secondary | ICD-10-CM | POA: Diagnosis not present

## 2015-12-04 DIAGNOSIS — L814 Other melanin hyperpigmentation: Secondary | ICD-10-CM | POA: Diagnosis not present

## 2015-12-04 DIAGNOSIS — Z85828 Personal history of other malignant neoplasm of skin: Secondary | ICD-10-CM | POA: Diagnosis not present

## 2015-12-04 DIAGNOSIS — L821 Other seborrheic keratosis: Secondary | ICD-10-CM | POA: Diagnosis not present

## 2015-12-04 DIAGNOSIS — L570 Actinic keratosis: Secondary | ICD-10-CM | POA: Diagnosis not present

## 2015-12-04 DIAGNOSIS — Z23 Encounter for immunization: Secondary | ICD-10-CM | POA: Diagnosis not present

## 2015-12-04 DIAGNOSIS — Z86018 Personal history of other benign neoplasm: Secondary | ICD-10-CM | POA: Diagnosis not present

## 2015-12-04 DIAGNOSIS — D225 Melanocytic nevi of trunk: Secondary | ICD-10-CM | POA: Diagnosis not present

## 2015-12-04 DIAGNOSIS — D1801 Hemangioma of skin and subcutaneous tissue: Secondary | ICD-10-CM | POA: Diagnosis not present

## 2015-12-09 DIAGNOSIS — F3132 Bipolar disorder, current episode depressed, moderate: Secondary | ICD-10-CM | POA: Diagnosis not present

## 2015-12-09 DIAGNOSIS — F411 Generalized anxiety disorder: Secondary | ICD-10-CM | POA: Diagnosis not present

## 2015-12-09 DIAGNOSIS — F423 Hoarding disorder: Secondary | ICD-10-CM | POA: Diagnosis not present

## 2015-12-11 ENCOUNTER — Encounter: Payer: Self-pay | Admitting: Pulmonary Disease

## 2015-12-13 DIAGNOSIS — G4733 Obstructive sleep apnea (adult) (pediatric): Secondary | ICD-10-CM | POA: Diagnosis not present

## 2015-12-16 ENCOUNTER — Ambulatory Visit (INDEPENDENT_AMBULATORY_CARE_PROVIDER_SITE_OTHER): Payer: Medicare HMO | Admitting: Gastroenterology

## 2015-12-16 ENCOUNTER — Encounter: Payer: Self-pay | Admitting: Gastroenterology

## 2015-12-16 VITALS — BP 126/76 | HR 72 | Ht 59.5 in | Wt 171.1 lb

## 2015-12-16 DIAGNOSIS — K219 Gastro-esophageal reflux disease without esophagitis: Secondary | ICD-10-CM

## 2015-12-16 NOTE — Progress Notes (Signed)
[facility] Progress Note  Chief Complaint: GERD  Subjective  History:  GERD with pH/impedenace confirmed Frequent breakthrough pyrosis despite PPI. Also has nocturnal regurgitation.   Denies dysphagia or odynophagia, vomiting or weight loss  ROS: Cardiovascular:  no chest pain Respiratory: no dyspnea  The patient's Past Medical, Family and Social History were reviewed and are on file in the EMR.  Objective:  Med list reviewed  Vital signs in last 24 hrs: Vitals:   12/16/15 1340  BP: 126/76  Pulse: 72    Physical Exam    No exam.  15 minutes , all spent in review of symptoms, studies and discussing plan for GERD   Ph/impedance shows significant GERD with good symptom correlation.  Eso mano nml   @ASSESSMENTPLANBEGIN @ Assessment: Encounter Diagnosis  Name Primary?  . Gastroesophageal reflux disease, esophagitis presence not specified Yes   She is now agreeable to surgical referral for fundoplication.   Plan:  Referral sent Continue current meds meanwhile.   Nelida Meuse III

## 2015-12-16 NOTE — Patient Instructions (Signed)
We will send your records to [facility] ([place] [facility]). They will contact you with an appointment.  If you are age 59 or older, your body mass index should be between 23-30. Your Body mass index is 33.98 kg/m. If this is out of the aforementioned range listed, please consider follow up with your Primary Care Provider.  If you are age 7 or younger, your body mass index should be between 19-25. Your Body mass index is 33.98 kg/m. If this is out of the aformentioned range listed, please consider follow up with your Primary Care Provider.   Thank you for choosing [facility]  Dr Wilfrid Lund III

## 2015-12-17 ENCOUNTER — Telehealth: Payer: Self-pay

## 2015-12-17 NOTE — Telephone Encounter (Signed)
Records faxed to [facility] for Nissen fundoplication surgery. Will await appointment information

## 2015-12-18 ENCOUNTER — Other Ambulatory Visit: Payer: Self-pay

## 2015-12-19 ENCOUNTER — Telehealth: Payer: Self-pay | Admitting: Pulmonary Disease

## 2015-12-19 DIAGNOSIS — F411 Generalized anxiety disorder: Secondary | ICD-10-CM | POA: Diagnosis not present

## 2015-12-19 DIAGNOSIS — F423 Hoarding disorder: Secondary | ICD-10-CM | POA: Diagnosis not present

## 2015-12-19 DIAGNOSIS — F3132 Bipolar disorder, current episode depressed, moderate: Secondary | ICD-10-CM | POA: Diagnosis not present

## 2015-12-19 NOTE — Telephone Encounter (Signed)
Pt has been scheduled at [facility] for 12-29-2015 @ 4 pm with Dr Johney Maine.

## 2015-12-19 NOTE — Telephone Encounter (Signed)
   DL for oct 2017 x 1 month : 100%, AHI 2.  autocpap 5-15.   Pt with f/u in 2 weeks. Will discuss results then.   Monica Becton, MD 12/19/2015, 7:36 AM [facility] Pager [phone] After 3 pm or if no answer, call [phone]

## 2015-12-19 NOTE — Telephone Encounter (Signed)
All records were faxed on 12-17-2015

## 2015-12-19 NOTE — Telephone Encounter (Signed)
Please make sure my office notes and procedure, imaging reports are sent.  Thanks

## 2015-12-25 DIAGNOSIS — F423 Hoarding disorder: Secondary | ICD-10-CM | POA: Diagnosis not present

## 2015-12-25 DIAGNOSIS — F3132 Bipolar disorder, current episode depressed, moderate: Secondary | ICD-10-CM | POA: Diagnosis not present

## 2015-12-29 ENCOUNTER — Ambulatory Visit: Payer: Self-pay | Admitting: Surgery

## 2015-12-29 DIAGNOSIS — R140 Abdominal distension (gaseous): Secondary | ICD-10-CM | POA: Diagnosis not present

## 2015-12-29 DIAGNOSIS — K210 Gastro-esophageal reflux disease with esophagitis: Secondary | ICD-10-CM | POA: Diagnosis not present

## 2015-12-29 DIAGNOSIS — K449 Diaphragmatic hernia without obstruction or gangrene: Secondary | ICD-10-CM | POA: Diagnosis not present

## 2015-12-29 NOTE — H&P (Signed)
[name] 12/29/2015 4:00 PM Location: [facility] Patient #: [id number] DOB: [birth year] Single / Language: Cleophus Molt / Race: White Female  Patient Care Team: Betty G Martinique, MD as PCP - General (Family Medicine) Clarene Reamer, MD as Consulting Physician (Psychiatry) Michael Boston, MD as Consulting Physician (General Surgery) Doran Stabler, MD as Consulting Physician (Gastroenterology)   History of Present Illness [name] MD; 12/29/2015 5:09 PM) The patient is a 60 year old female who presents with a hiatal hernia. Note for "Hiatal hernia": Patient sent for surgical consultation by her gastroenterologist, Dr. Loletha Carrow with [facility] gastroenterology. Concern for hiatal hernia with worsening heartburn/reflux.  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 Has struggled with heartburn and reflux for many decades. Was on proton pump inhibitors for quite some time. Got to cautiously so try to ride it out with over-the-counter medications such as Rolaids. However worsened. He followed up with gastroenterology. Restarted on proton pump inhibitors. She's been on them for years. Had some partial help with doubt getting to the point where she gets severe heartburn and reflux and regurgitation. Pillow. She gets regurgitation bending over. Often gets some fullness and bloating. Started to get a little more constipated. Moving her bowels about every other day now was supposed to daily. She does have some sleep apnea control was CPAP. She required an open cholecystectomy when she was 60 years old. Sounds like she had gallstone pancreatitis. Still gets some soreness lateral to her RUQ incision from time to time. She has poor knees so needs a cart or walker for support sometimes. She can walk at least 30 minutes without difficulty with that. She did have endometrial ablation for some endometriosis many years ago. No problems since then.  No personal nor family history  of GI/colon cancer, inflammatory bowel disease, irritable bowel syndrome, allergy such as Celiac Sprue, dietary/dairy problems, colitis, ulcers nor gastritis. No recent sick contacts/gastroenteritis. No travel outside the country. No changes in diet. No dysphagia to solids or liquids. No hematochezia, hematemesis, coffee ground emesis.   Other Problems Marjean Donna, CMA; 12/29/2015 4:02 PM) Anxiety Disorder Arthritis Asthma Back Pain Bladder Problems Cancer Gastroesophageal Reflux Disease General anesthesia - complications Hemorrhoids High blood pressure Lump In Breast Migraine Headache Pancreatitis Sleep Apnea Thyroid Disease  Past Surgical History Marjean Donna, CMA; 12/29/2015 4:02 PM) Anal Fissure Repair Breast Biopsy Right. multiple Foot Surgery Right. Gallbladder Surgery - Open Knee Surgery Bilateral. Oral Surgery Tonsillectomy  Diagnostic Studies History Marjean Donna, CMA; 12/29/2015 4:02 PM) Colonoscopy 1-5 years ago Mammogram 1-3 years ago Pap Smear 1-5 years ago  Allergies Marjean Donna, [place]; 12/29/2015 4:02 PM) No Known Drug Allergies 12/29/2015  Medication History (Sonya Bynum, CMA; 12/29/2015 4:04 PM) Ziprasidone HCl (20MG  Capsule, Oral) Active. Sertraline HCl (100MG  Tablet, Oral) Active. Qvar (80MCG/ACT Aerosol Soln, Inhalation) Active. Ventolin HFA (108 (90 Base)MCG/ACT Aerosol Soln, Inhalation) Active. Omeprazole (40MG  Capsule DR, Oral) Active. Montelukast Sodium (10MG  Tablet, Oral) Active. Gabapentin (300MG  Capsule, Oral) Active. Levothyroxine Sodium (50MCG Tablet, Oral) Active. Amitriptyline HCl (25MG  Tablet, Oral) Active. Fluticasone Propionate (50MCG/ACT Suspension, Nasal as needed) Active. Medications Reconciled  Social History ([place]; 12/29/2015 4:02 PM) Alcohol use Occasional alcohol use. Caffeine use Carbonated beverages, Coffee, Tea. No drug use Tobacco use Never smoker.  Family  History Marjean Donna, [place]; 12/29/2015 4:02 PM) Alcohol Abuse Family Members In General. Arthritis Mother. Cancer Father. Cerebrovascular Accident Family Members In General. Colon Polyps Sister. Hypertension Mother. Migraine Headache Brother. Thyroid problems Mother.  Pregnancy / Birth History (  Marjean Donna, CMA; 12/29/2015 4:02 PM) Age at menarche 57 years. Age of menopause 40-60 Contraceptive History Oral contraceptives. Gravida 0 Irregular periods     Review of Systems ([place]; 12/29/2015 4:02 PM) General Present- Weight Gain. Not Present- Appetite Loss, Chills, Fatigue, Fever, Night Sweats and Weight Loss. Skin Present- Dryness. Not Present- Change in Wart/Mole, Hives, Jaundice, New Lesions, Non-Healing Wounds, Rash and Ulcer. HEENT Present- Ringing in the Ears, Seasonal Allergies, Sinus Pain and Wears glasses/contact lenses. Not Present- Earache, Hearing Loss, Hoarseness, Nose Bleed, Oral Ulcers, Sore Throat, Visual Disturbances and Yellow Eyes. Breast Not Present- Breast Mass, Breast Pain, Nipple Discharge and Skin Changes. Cardiovascular Not Present- Chest Pain, Difficulty Breathing Lying Down, Leg Cramps, Palpitations, Rapid Heart Rate, Shortness of Breath and Swelling of Extremities. Gastrointestinal Present- Indigestion and Nausea. Not Present- Abdominal Pain, Bloating, Bloody Stool, Change in Bowel Habits, Chronic diarrhea, Constipation, Difficulty Swallowing, Excessive gas, Gets full quickly at meals, Hemorrhoids, Rectal Pain and Vomiting. Female Genitourinary Present- Frequency, Nocturia and Urgency. Not Present- Painful Urination and Pelvic Pain. Musculoskeletal Present- Joint Pain and Joint Stiffness. Not Present- Back Pain, Muscle Pain, Muscle Weakness and Swelling of Extremities. Psychiatric Present- Anxiety and Bipolar. Not Present- Change in Sleep Pattern, Depression, Fearful and Frequent crying. Hematology Not Present- Blood Thinners, Easy  Bruising, Excessive bleeding, Gland problems, HIV and Persistent Infections.  Vitals (Sonya Bynum CMA; 12/29/2015 4:02 PM) 12/29/2015 4:02 PM Weight: 168 lb Height: 64in Body Surface Area: 1.82 m Body Mass Index: 28.84 kg/m  Temp.: 63F(Temporal)  Pulse: 72 (Regular)  BP: 132/74 (Sitting, Left Arm, Standard)      Physical Exam [name] MD; 12/29/2015 5:09 PM)  General Mental Status-Alert. General Appearance-Not in acute distress, Not Sickly. Orientation-Oriented X3. Hydration-Well hydrated. Voice-Normal.  Integumentary Global Assessment Upon inspection and palpation of skin surfaces of the - Axillae: non-tender, no inflammation or ulceration, no drainage. and Distribution of scalp and body hair is normal. General Characteristics Temperature - normal warmth is noted.  Head and Neck Head-normocephalic, atraumatic with no lesions or palpable masses. Face Global Assessment - atraumatic, no absence of expression. Neck Global Assessment - no abnormal movements, no bruit auscultated on the right, no bruit auscultated on the left, no decreased range of motion, non-tender. Trachea-midline. Thyroid Gland Characteristics - non-tender.  Eye Eyeball - Left-Extraocular movements intact, No Nystagmus. Eyeball - Right-Extraocular movements intact, No Nystagmus. Cornea - Left-No Hazy. Cornea - Right-No Hazy. Sclera/Conjunctiva - Left-No scleral icterus, No Discharge. Sclera/Conjunctiva - Right-No scleral icterus, No Discharge. Pupil - Left-Direct reaction to light normal. Pupil - Right-Direct reaction to light normal. Note: Wears glasses. Vision acceptable  ENMT Ears Pinna - Left - no drainage observed, no generalized tenderness observed. Right - no drainage observed, no generalized tenderness observed. Nose and Sinuses External Inspection of the Nose - no destructive lesion observed. Inspection of the nares - Left - quiet  respiration. Right - quiet respiration. Mouth and Throat Lips - Upper Lip - no fissures observed, no pallor noted. Lower Lip - no fissures observed, no pallor noted. Nasopharynx - no discharge present. Oral Cavity/Oropharynx - Tongue - no dryness observed. Oral Mucosa - no cyanosis observed. Hypopharynx - no evidence of airway distress observed.  Chest and Lung Exam Inspection Movements - Normal and Symmetrical. Accessory muscles - No use of accessory muscles in breathing. Palpation Palpation of the chest reveals - Non-tender. Auscultation Breath sounds - Normal and Clear.  Cardiovascular Auscultation Rhythm - Regular. Murmurs & Other Heart Sounds - Auscultation of  the heart reveals - No Murmurs and No Systolic Clicks.  Abdomen Inspection Inspection of the abdomen reveals - No Visible peristalsis and No Abnormal pulsations. Umbilicus - No Bleeding, No Urine drainage. Palpation/Percussion Palpation and Percussion of the abdomen reveal - Soft, Non Tender, No Rebound tenderness, No Rigidity (guarding) and No Cutaneous hyperesthesia. Note: Abdomen soft. Right upper quadrant subcostal incision well-healed. Mild discomfort in right flank subcostal ridge. No step-off. No guarding. Nontender, nondistended. No guarding. No umbilical no other hernias  Female Genitourinary Sexual Maturity Tanner 5 - Adult hair pattern. Note: No vaginal bleeding nor discharge  Peripheral Vascular Upper Extremity Inspection - Left - No Cyanotic nailbeds, Not Ischemic. Right - No Cyanotic nailbeds, Not Ischemic.  Neurologic Neurologic evaluation reveals -normal attention span and ability to concentrate, able to name objects and repeat phrases. Appropriate fund of knowledge , normal sensation and normal coordination. Mental Status Affect - not angry, not paranoid. Cranial Nerves-Normal Bilaterally. Gait-Normal.  Neuropsychiatric Mental status exam performed with findings of-able to articulate well  with normal speech/language, rate, volume and coherence, thought content normal with ability to perform basic computations and apply abstract reasoning and no evidence of hallucinations, delusions, obsessions or homicidal/suicidal ideation. Note: Pleasant. Inquisitive  Musculoskeletal Global Assessment Spine, Ribs and Pelvis - no instability, subluxation or laxity. Right Upper Extremity - no instability, subluxation or laxity.  Lymphatic Head & Neck  General Head & Neck Lymphatics: Bilateral - Description - No Localized lymphadenopathy. Axillary  General Axillary Region: Bilateral - Description - No Localized lymphadenopathy. Femoral & Inguinal  Generalized Femoral & Inguinal Lymphatics: Left - Description - No Localized lymphadenopathy. Right - Description - No Localized lymphadenopathy.    Assessment & Plan [name] MD; 12/29/2015 4:47 PM)  PARAESOPHAGEAL HIATAL HERNIA (K44.9) Impression: Not particularly large but with worsening heartburn and reflux would recommend repair.  GASTROESOPHAGEAL REFLUX DISEASE WITH ESOPHAGITIS (K21.0) Impression: Inadequately controlled heartburn and reflux despite maximal medical therapy and diet modification. Known hiatal hernia.  I think she would benefit from surgical repair. Minimally invasive approach. Probably 56 biological mesh buttress reinforcement.  Manometry shows good esophageal function. Can benefit from Nissen fundoplication  Current Plans You are being scheduled for surgery - Our schedulers will call you.  You should hear from our office's scheduling department within 5 working days about the location, date, and time of surgery. We try to make accommodations for patient's preferences in scheduling surgery, but sometimes the OR schedule or the surgeon's schedule prevents [place] from making those accommodations.  If you have not heard from our office [phone]) in 5 working days, call the office and ask for your surgeon's  nurse.  If you have other questions about your diagnosis, plan, or surgery, call the office and ask for your surgeon's nurse.  Follow Up - Call [facility] office after tests / studies done to discuss further plans Pt Education - [facility] Esophageal Surgery Diet HCI (Karlee Staff): discussed with patient and provided information. Pt Education - [facility] Laparoscopic Surgery HCI The anatomy & physiology of the foregut and anti-reflux mechanism was discussed. The pathophysiology of hiatal herniation and GERD was discussed. Natural history risks without surgery was discussed. The patient's symptoms are not adequately controlled by medicines and other non-operative treatments. I feel the risks of no intervention will lead to serious problems that outweigh the operative risks; therefore, I recommended surgery to reduce the hiatal hernia out of the chest and fundoplication to rebuild the anti-reflux valve and control reflux better. Need for a thorough workup  to rule out the differential diagnosis and plan treatment was explained. I explained laparoscopic techniques with possible need for an open approach.  Risks such as bleeding, infection, abscess, leak, need for further treatment, heart attack, death, and other risks were discussed. I noted a good likelihood this will help address the problem. Goals of post-operative recovery were discussed as well. Possibility that this will not correct all symptoms was explained. Post-operative dysphagia, need for short-term liquid & pureed diet, inability to vomit, possibility of reherniation, possible need for medicines to help control symptoms in addition to surgery were discussed. We will work to minimize complications. Educational handouts further explaining the pathology, treatment options, and dysphagia diet was given as well. Questions were answered. The patient expresses understanding & wishes to proceed with surgery.  POSTPRANDIAL BLOATING (R14.0) Impression: May be related just to her  hiatal hernia, but I would like to rule out gastroparesis.  If mild/moderate, fundoplication should help resolve the issue. If severe delayed gastric emptying, she could benefit from concurrent pyloroplasty.  [name], M.D., F.A.C.S. Gastrointestinal and Minimally Invasive Surgery [facility] [address] [place], [place] [address] [phone] Main / Paging

## 2015-12-31 ENCOUNTER — Other Ambulatory Visit (HOSPITAL_COMMUNITY): Payer: Self-pay | Admitting: Surgery

## 2015-12-31 DIAGNOSIS — R69 Illness, unspecified: Secondary | ICD-10-CM | POA: Diagnosis not present

## 2015-12-31 DIAGNOSIS — R140 Abdominal distension (gaseous): Secondary | ICD-10-CM

## 2016-01-05 ENCOUNTER — Encounter: Payer: Self-pay | Admitting: Pulmonary Disease

## 2016-01-05 DIAGNOSIS — F423 Hoarding disorder: Secondary | ICD-10-CM | POA: Diagnosis not present

## 2016-01-05 DIAGNOSIS — F3132 Bipolar disorder, current episode depressed, moderate: Secondary | ICD-10-CM | POA: Diagnosis not present

## 2016-01-06 ENCOUNTER — Ambulatory Visit (INDEPENDENT_AMBULATORY_CARE_PROVIDER_SITE_OTHER): Payer: Medicare HMO | Admitting: Pulmonary Disease

## 2016-01-06 ENCOUNTER — Encounter: Payer: Self-pay | Admitting: Pulmonary Disease

## 2016-01-06 DIAGNOSIS — E669 Obesity, unspecified: Secondary | ICD-10-CM

## 2016-01-06 DIAGNOSIS — J45909 Unspecified asthma, uncomplicated: Secondary | ICD-10-CM

## 2016-01-06 DIAGNOSIS — G4733 Obstructive sleep apnea (adult) (pediatric): Secondary | ICD-10-CM

## 2016-01-06 DIAGNOSIS — Z6832 Body mass index (BMI) 32.0-32.9, adult: Secondary | ICD-10-CM | POA: Insufficient documentation

## 2016-01-06 MED ORDER — BUDESONIDE-FORMOTEROL FUMARATE 80-4.5 MCG/ACT IN AERO: 2.0000 | INHALATION_SPRAY | Freq: Two times a day (BID) | RESPIRATORY_TRACT | 0 refills | Status: DC

## 2016-01-06 MED ORDER — AEROCHAMBER PLUS MISC: 0 refills | Status: DC

## 2016-01-06 NOTE — Assessment & Plan Note (Signed)
Patient with chronic asthma which has been stable. Has allergic rhinitis as well. She was seeing Dr. Annamaria Boots years ago. She is on Qvar but is having issues with insurance coverage. Plan to switch to Symbicort 80/4.5, 2 puffs twice a day. We will start her on a spacer as well. Continue albuterol every 4 hours as needed.  Needs  Vaccine. Not smoking.

## 2016-01-06 NOTE — Patient Instructions (Addendum)
  It was a pleasure taking care of you today!  Continue using your CPAP machine.  We will try you on Symbicort, 80/4.5, 2 puffs 2x/day with a spacer.   Please make sure you use your CPAP device everytime you sleep.  We will monitor the usage of your machine per your insurance requirement.  Your insurance company may take the machine from you if you are not using it regularly.   Please clean the mask, tubings, filter, water reservoir with soapy water every week.  Please use distilled water for the water reservoir.   Please call the office or your machine provider (DME company) if you are having issues with the device.   Return to clinic in 6 months  with Dr. Corrie Dandy.

## 2016-01-06 NOTE — Progress Notes (Signed)
Subjective:    Patient ID: [name], female    DOB: 04/28/1955, 60 y.o.   MRN: [id number]  HPI  Pt. Presents to the office for self referral for OSA. She had previously been followed by Dr. Annamaria Boots in  the early 2000's  for OSA, with  CPAP, 3-4 years ago she lost the cord to her machine, so she stopped using the device. She has recently gained 45 pounds and is concerned that her OSA is most likely worse.She wishes to resume CPAP treatment. She goes to bed at between 12:30 am to 130 am. It takes about 1-2 hours to fall asleep.She gets up between 9am and 10 am. She does not operate heavy machinery, she does not have a Teacher, adult education. She did have a sleep study in the past per Dr. Annamaria Boots.Epworth Score is 5. She does have seasonal allergies, and environmental allergies. She does take Zyrtec generic, and phenylephrine.There is no family history of sleep apnea that the family is aware of.Pt. States she has fatigue and daytime sleepiness. Never feels like she gets enough sleep. She always wakes up tired.  ROV 01/06/16 Patient returns to the office as follow-up on her sleep apnea. Since last seen, she had a home sleep study in September which showed AHI of 13. He was started on an auto CPAP. Download the last month: 100%, AHI 2. Asthma has been stable.  Review of Systems  Constitutional: Negative for chills, fever and unexpected weight change.  HENT: Negative for congestion, dental problem, ear pain, nosebleeds, postnasal drip, rhinorrhea, sinus pressure, sneezing, sore throat, trouble swallowing and voice change.   Eyes: Negative for visual disturbance.  Respiratory: Negative for cough, choking and shortness of breath.   Cardiovascular: Negative for chest pain and leg swelling.  Gastrointestinal: Negative for abdominal pain, diarrhea and vomiting.  Genitourinary: Negative for difficulty urinating.  Musculoskeletal: Negative for arthralgias.  Skin: Negative for rash.  Neurological:  Negative for tremors, syncope and headaches.  Hematological: Does not bruise/bleed easily.       Objective:   Physical Exam    Vitals:  Vitals:   01/06/16 1128  BP: 122/78  Pulse: 73  SpO2: 100%  Weight: 163 lb (73.9 kg)  Height: 4' 11.5" (1.511 m)    Constitutional/General:  Pleasant, well-nourished, well-developed, not in any distress,  Comfortably seating.  Well kempt  Body mass index is 32.37 kg/m. Wt Readings from Last 3 Encounters:  01/06/16 163 lb (73.9 kg)  12/16/15 171 lb 2 oz (77.6 kg)  09/25/15 155 lb (70.3 kg)     HEENT: Pupils equal and reactive to light and accommodation. Anicteric sclerae. Normal nasal mucosa.   No oral  lesions,  mouth clear,  oropharynx clear, no postnasal drip. (-) Oral thrush. No dental caries.  Airway - Mallampati class III  Neck: No masses. Midline trachea. No JVD, (-) LAD. (-) bruits appreciated.  Respiratory/Chest: Grossly normal chest. (-) deformity. (-) Accessory muscle use.  Symmetric expansion. (-) Tenderness on palpation.  Resonant on percussion.  Diminished BS on both lower lung zones. (-) wheezing, crackles, rhonchi (-) egophony  Cardiovascular: Regular rate and  rhythm, heart sounds normal, no murmur or gallops, no peripheral edema  Gastrointestinal:  Normal bowel sounds. Soft, non-tender. No hepatosplenomegaly.  (-) masses.   Musculoskeletal:  Normal muscle tone. Normal gait.   Extremities: Grossly normal. (-) clubbing, cyanosis.  (-) edema  Skin: (-) rash,lesions seen.   Neurological/Psychiatric : alert, oriented to time, place, person. Normal mood and affect  Assessment & Plan:  Obstructive sleep apnea Patient with hypersomnia, sleepiness, snoring, gasping. Hypersomnia affects functionality.  HST 10/2015 : AHI 13. autocpap 5-15.   Since using CPAP, she feels better. Less hypersomnia. More energy. Feels benefit of CPAP. Download the last month: 100%, AHI 1.7  Plan : We extensively discussed  the importance of treating OSA and the need to use PAP therapy.   Continue with autocpap 5-15 cm water   Patient was instructed to have mask, tubings, filter, reservoir cleaned at least once a week with soapy water.  Patient was instructed to call the office if he/she is having issues with the PAP device.    I advised patient to obtain sufficient amount of sleep --  7 to 8 hours at least in a 24 hr period.  Patient was advised to follow good sleep hygiene.  Patient was advised NOT to engage in activities requiring concentration and/or vigilance if he/she is and  sleepy.  Patient is NOT to drive if he/she is sleepy.     Asthma Patient with chronic asthma which has been stable. Has allergic rhinitis as well. She was seeing Dr. Annamaria Boots years ago. She is on Qvar but is having issues with insurance coverage. Plan to switch to Symbicort 80/4.5, 2 puffs twice a day. We will start her on a spacer as well. Continue albuterol every 4 hours as needed.  Needs  Vaccine. Not smoking.   Obesity Weight reduction   Return to clinic in 6 mos.     Monica Becton, MD 01/06/2016, 1:46 PM [facility] Pager [phone] After 3 pm or if no answer, call [phone]

## 2016-01-06 NOTE — Assessment & Plan Note (Signed)
Patient with hypersomnia, sleepiness, snoring, gasping. Hypersomnia affects functionality.  HST 10/2015 : AHI 13. autocpap 5-15.   Since using CPAP, she feels better. Less hypersomnia. More energy. Feels benefit of CPAP. Download the last month: 100%, AHI 1.7  Plan : We extensively discussed the importance of treating OSA and the need to use PAP therapy.   Continue with autocpap 5-15 cm water   Patient was instructed to have mask, tubings, filter, reservoir cleaned at least once a week with soapy water.  Patient was instructed to call the office if he/she is having issues with the PAP device.    I advised patient to obtain sufficient amount of sleep --  7 to 8 hours at least in a 24 hr period.  Patient was advised to follow good sleep hygiene.  Patient was advised NOT to engage in activities requiring concentration and/or vigilance if he/she is and  sleepy.  Patient is NOT to drive if he/she is sleepy.

## 2016-01-06 NOTE — Assessment & Plan Note (Signed)
Weight reduction 

## 2016-01-12 DIAGNOSIS — G4733 Obstructive sleep apnea (adult) (pediatric): Secondary | ICD-10-CM | POA: Diagnosis not present

## 2016-01-13 ENCOUNTER — Encounter (HOSPITAL_COMMUNITY): Payer: Self-pay | Admitting: Radiology

## 2016-01-13 ENCOUNTER — Ambulatory Visit (HOSPITAL_COMMUNITY)
Admission: RE | Admit: 2016-01-13 | Discharge: 2016-01-13 | Disposition: A | Discharge status: Home / Self Care | Payer: Medicare HMO | Source: Ambulatory Visit | Attending: Surgery | Admitting: Surgery

## 2016-01-13 DIAGNOSIS — R140 Abdominal distension (gaseous): Secondary | ICD-10-CM | POA: Diagnosis present

## 2016-01-13 DIAGNOSIS — R112 Nausea with vomiting, unspecified: Secondary | ICD-10-CM | POA: Diagnosis not present

## 2016-01-13 MED ORDER — TECHNETIUM TC 99M SULFUR COLLOID: 2.1000 | Freq: Once | INTRAVENOUS | Status: DC | PRN

## 2016-01-14 DIAGNOSIS — R69 Illness, unspecified: Secondary | ICD-10-CM | POA: Diagnosis not present

## 2016-01-14 DIAGNOSIS — F411 Generalized anxiety disorder: Secondary | ICD-10-CM | POA: Diagnosis not present

## 2016-01-14 DIAGNOSIS — F423 Hoarding disorder: Secondary | ICD-10-CM | POA: Diagnosis not present

## 2016-01-17 DIAGNOSIS — K219 Gastro-esophageal reflux disease without esophagitis: Secondary | ICD-10-CM | POA: Diagnosis not present

## 2016-01-17 DIAGNOSIS — Z0000 Encounter for general adult medical examination without abnormal findings: Secondary | ICD-10-CM | POA: Diagnosis not present

## 2016-01-17 DIAGNOSIS — G43019 Migraine without aura, intractable, without status migrainosus: Secondary | ICD-10-CM | POA: Diagnosis not present

## 2016-01-17 DIAGNOSIS — J4520 Mild intermittent asthma, uncomplicated: Secondary | ICD-10-CM | POA: Diagnosis not present

## 2016-01-17 DIAGNOSIS — E039 Hypothyroidism, unspecified: Secondary | ICD-10-CM | POA: Diagnosis not present

## 2016-01-26 DIAGNOSIS — F3132 Bipolar disorder, current episode depressed, moderate: Secondary | ICD-10-CM | POA: Diagnosis not present

## 2016-01-26 DIAGNOSIS — R69 Illness, unspecified: Secondary | ICD-10-CM | POA: Diagnosis not present

## 2016-01-26 DIAGNOSIS — F423 Hoarding disorder: Secondary | ICD-10-CM | POA: Diagnosis not present

## 2016-01-26 DIAGNOSIS — F411 Generalized anxiety disorder: Secondary | ICD-10-CM | POA: Diagnosis not present

## 2016-01-29 ENCOUNTER — Ambulatory Visit (INDEPENDENT_AMBULATORY_CARE_PROVIDER_SITE_OTHER): Payer: Medicare HMO | Admitting: Family Medicine

## 2016-01-29 ENCOUNTER — Encounter: Payer: Self-pay | Admitting: Family Medicine

## 2016-01-29 VITALS — BP 122/80 | HR 70 | Temp 97.6°F | Ht 59.5 in | Wt 165.0 lb

## 2016-01-29 DIAGNOSIS — J019 Acute sinusitis, unspecified: Secondary | ICD-10-CM | POA: Diagnosis not present

## 2016-01-29 DIAGNOSIS — J310 Chronic rhinitis: Secondary | ICD-10-CM

## 2016-01-29 DIAGNOSIS — F3162 Bipolar disorder, current episode mixed, moderate: Secondary | ICD-10-CM | POA: Diagnosis not present

## 2016-01-29 DIAGNOSIS — R69 Illness, unspecified: Secondary | ICD-10-CM | POA: Diagnosis not present

## 2016-01-29 DIAGNOSIS — E039 Hypothyroidism, unspecified: Secondary | ICD-10-CM | POA: Diagnosis not present

## 2016-01-29 DIAGNOSIS — N952 Postmenopausal atrophic vaginitis: Secondary | ICD-10-CM

## 2016-01-29 MED ORDER — LEVOTHYROXINE SODIUM 50 MCG PO TABS: 50.0000 ug | ORAL_TABLET | Freq: Every day | ORAL | 1 refills | Status: DC

## 2016-01-29 MED ORDER — FLUTICASONE PROPIONATE 50 MCG/ACT NA SUSP: 2.0000 | Freq: Every day | NASAL | 6 refills | Status: DC

## 2016-01-29 MED ORDER — AZELASTINE HCL 0.1 % NA SOLN: 2.0000 | Freq: Two times a day (BID) | NASAL | 4 refills | Status: DC

## 2016-01-29 MED ORDER — ESTRADIOL 0.1 MG/GM VA CREA: 1.0000 | TOPICAL_CREAM | VAGINAL | 3 refills | Status: DC

## 2016-01-29 MED ORDER — AMOXICILLIN-POT CLAVULANATE 875-125 MG PO TABS: 1.0000 | ORAL_TABLET | Freq: Two times a day (BID) | ORAL | 0 refills | Status: AC

## 2016-01-29 NOTE — Patient Instructions (Signed)
A few things to remember from today's visit:   Chronic allergic rhinitis, unspecified seasonality, unspecified trigger  Acute sinusitis, recurrence not specified, unspecified location  Bipolar 1 disorder, mixed, moderate (HCC)  Hypothyroidism, unspecified type  Chronic rhinitis, unspecified type - Plan: fluticasone (FLONASE) 50 MCG/ACT nasal spray  Dyspareunia, female - Plan: estradiol (ESTRACE) 0.1 MG/GM vaginal cream  Today was an acute visit.  Diagnostic of bipolar changed, please let me know if this is accurate.    Please be sure medication list is accurate. If a new problem present, please set up appointment sooner than planned today.

## 2016-01-29 NOTE — Progress Notes (Signed)
Pre visit review using our clinic review tool, if applicable. No additional management support is needed unless otherwise documented below in the visit note. 

## 2016-01-29 NOTE — Progress Notes (Signed)
HPI:  ACUTE VISIT:  Chief Complaint  Patient presents with  . Sinus Problem    Ms.Jaisha Villacres Lehrke is a 60 y.o. female, who is here today complaining of 14-18 days of upper respiratory symptoms.   + Intermittently dysphonia and fever.99.5-99.8 temp for the past 2 weeks, she states that her "normal temperature" is 98 F. + Mild cough.  +Facial pain, nasal congestion, and post nasal drainage.   Hx of allergies.  Sick contact: people at church. No recent travel.  She has tried: Ibuprofen, Zyrtec,Flonase Mucinex, Tylenol, and Phenylephrine but these medications do not seem to help.   Hx of GERD, she is on Omeprazole 40 mg, states that she is"not doing well", following with surgeon and planning on hiatal hernia repair.  -She is also requesting refills on some of her medications. History of hypothyroidism,20+ years Hx, currently she is on levothyroxine 50 g daily. She denies diarrhea/constipation, cold/heat intolerance, or changes in mood or fatigue.  History of atrophic vaginosis, currently she is on starting her cream, which she uses daily at bedtime.  Allergic rhinitis, currently she is on Flonase nasal spray, Singulair 10 mg, and Zyrtec 10 mg daily.  She has history of bipolar disorder, currently on disability. She is currently following with psychiatrist and upset because after her last office visit she noted on her visit summery diagnosis of "bipolar disorder in partial remission." She is concerned this diagnosis will affect her disability status. She states that she is not in partial remission, she is currently on pharmacologic treatment, she denies current manic symptoms, she is requesting me to remove this diagnosis from her problem list.   Review of Systems  Constitutional: Positive for fatigue and fever. Negative for appetite change.  HENT: Positive for congestion, postnasal drip, rhinorrhea and sinus pain. Negative for ear pain, facial swelling, mouth  sores, sneezing, sore throat, trouble swallowing and voice change.   Eyes: Positive for discharge and itching. Negative for pain, redness and visual disturbance.  Respiratory: Positive for cough. Negative for shortness of breath and wheezing.   Gastrointestinal: Negative for abdominal pain, diarrhea, nausea and vomiting.  Endocrine: Negative for cold intolerance and heat intolerance.  Musculoskeletal: Negative for myalgias and neck pain.  Skin: Negative for rash.  Allergic/Immunologic: Positive for environmental allergies.  Neurological: Positive for headaches (frontal pressure). Negative for syncope and weakness.  Hematological: Negative for adenopathy. Does not bruise/bleed easily.  Psychiatric/Behavioral: Positive for behavioral problems. The patient is nervous/anxious.       Current Outpatient Prescriptions on File Prior to Visit  Medication Sig Dispense Refill  . albuterol (PROVENTIL HFA;VENTOLIN HFA) 108 (90 Base) MCG/ACT inhaler Inhale 1-2 puffs into the lungs daily as needed for wheezing or shortness of breath. 54 Inhaler 3  . amitriptyline (ELAVIL) 25 MG tablet Take 25 mg by mouth at bedtime.    Marland Kitchen anti-nausea (EMETROL) solution Take 30 mLs by mouth every 15 (fifteen) minutes as needed for nausea or vomiting.    . budesonide-formoterol (SYMBICORT) 80-4.5 MCG/ACT inhaler Inhale 2 puffs into the lungs 2 (two) times daily. 1 Inhaler 0  . cetirizine (ZYRTEC) 10 MG tablet Take 1 tablet (10 mg total) by mouth daily. 30 tablet 5  . clonazePAM (KLONOPIN) 0.5 MG tablet Take 0.5 mg by mouth at bedtime.    . gabapentin (NEURONTIN) 300 MG capsule Take 300 mg by mouth 3 (three) times daily.    . hydrocortisone cream 1 % Apply 1 application topically 2 (two) times daily.    Marland Kitchen  ibuprofen (ADVIL,MOTRIN) 200 MG tablet Take 200 mg by mouth every 6 (six) hours as needed. Take 2-4 tablets every 4 hours as needed    . lithium carbonate 300 MG capsule Take 150 mg by mouth 3 (three) times daily with meals.      Marland Kitchen loperamide (IMODIUM A-D) 2 MG tablet Take 2 mg by mouth 4 (four) times daily as needed for diarrhea or loose stools.    . montelukast (SINGULAIR) 10 MG tablet Take 1 tablet (10 mg total) by mouth at bedtime. 30 tablet 11  . naproxen sodium (ANAPROX) 220 MG tablet Take 4 tablets (880 mg total) by mouth 2 (two) times daily as needed (headache). 60 tablet 5  . omeprazole (PRILOSEC) 40 MG capsule Take 1 capsule (40 mg total) by mouth daily. 90 capsule 3  . oxybutynin (DITROPAN) 5 MG tablet Take 1 tablet by mouth 3 (three) times daily.   11  . rizatriptan (MAXALT-MLT) 10 MG disintegrating tablet Take 1 tablet (10 mg total) by mouth daily as needed for migraine. May repeat in 2 hours if needed. Do not exceed 2 doses in 1 week period 30 tablet 3  . sertraline (ZOLOFT) 100 MG tablet Take 2 tablets (200 mg total) by mouth daily. (Patient taking differently: Take 100 mg by mouth daily. ) 14 tablet 0  . Spacer/Aero-Holding Chambers (AEROCHAMBER PLUS) inhaler Use as instructed 1 each 0  . ziprasidone (GEODON) 20 MG capsule Take 20 mg by mouth 2 (two) times daily with a meal.   2   No current facility-administered medications on file prior to visit.      Past Medical History:  Diagnosis Date  . Allergy    seasonal  . Arthritis   . Asthma   . Bipolar 1 disorder ([place])   . 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 Complication of anesthesia   . Depression   . GERD (gastroesophageal reflux disease)   . Headache(784.0)   . History of frequent urinary tract infections   . Hypertension   . Hypothyroidism   . Interstitial cystitis   . Neuromuscular disorder ([place]) 02/08/2012   TREMORS   . Rosacea   . Shortness of breath   . Sleep apnea    has cpap, doesnt use, lost machine   . Thyroid disease    no present treatment 03-18-14   Allergies  Allergen Reactions  . Amlodipine Swelling    Peripheral edema  . Pseudoephedrine Other (See Comments)    I fly off the walls   . Risperidone And  Related Other (See Comments)    Stomach upset, insomnia, drooling, tremors, "jerks," sensitivity to touch.   . Aspirin Other (See Comments)    REACTION: upset stomach  . Bupivacaine Hcl Hives  . Codeine Itching  . Etodolac Rash  . Indocin [Indomethacin] Other (See Comments)    Muscle spasms  . Ivp Dye [Iodinated Diagnostic Agents] Other (See Comments)    Flushed and Fever, itch all over  . Pentazocine Lactate Itching    oozing  . Propoxyphene N-Acetaminophen Itching  . Propranolol Nausea Only  . Sulfonamide Derivatives Other (See Comments)    unknown  . Tramadol Hcl Other (See Comments)    unknown    Social History   Social History  . Marital status: Divorced    Spouse name: N/A  . Number of children: 0  . Years of education: N/A   Social History Main Topics  . Smoking status: Never Smoker  .  Smokeless tobacco: Never Used  . Alcohol use 0.0 oz/week     Comment: once every 3-4 months  . Drug use: No  . Sexual activity: Not Currently   Other Topics Concern  . None   Social History Narrative  . None    Vitals:   01/29/16 1609  BP: 122/80  Pulse: 70  Temp: 97.6 F (36.4 C)    O2 sat 98% at RA.  Body mass index is 32.77 kg/m.   Physical Exam  Nursing note and vitals reviewed. Constitutional: She is oriented to person, place, and time. She appears well-developed. She does not appear ill. No distress.  HENT:  Head: Atraumatic.  Right Ear: Tympanic membrane, external ear and ear canal normal.  Left Ear: Tympanic membrane, external ear and ear canal normal.  Nose: Rhinorrhea present. Right sinus exhibits maxillary sinus tenderness and frontal sinus tenderness. Left sinus exhibits maxillary sinus tenderness and frontal sinus tenderness.  Mouth/Throat: Oropharynx is clear and moist and mucous membranes are normal.  Hypertrophic turbinates.  Eyes: Conjunctivae and EOM are normal. Right eye exhibits no discharge. Left eye exhibits no discharge.  Cardiovascular:  Normal rate and regular rhythm.   No murmur heard. Respiratory: Effort normal and breath sounds normal. No stridor. No respiratory distress.  Lymphadenopathy:       Head (right side): No submandibular adenopathy present.       Head (left side): No submandibular adenopathy present.    She has no cervical adenopathy.  Neurological: She is alert and oriented to person, place, and time. She has normal strength.  Skin: Skin is warm. No rash noted. No erythema.  Psychiatric: Her speech is normal. Her mood appears anxious. Her affect is blunt.  Upset, well groomed, good eye contact.      ASSESSMENT AND PLAN:     Odella was seen today for sinus problem.  Diagnoses and all orders for this visit:   Acute sinusitis, recurrence not specified, unspecified location  Explained that symptoms could be related to allergies and/or residual symptoms from recent viral URI infection.  Antibiotic treatment recommended. If symptoms do not resolve she might need sinus CT.  -     amoxicillin-clavulanate (AUGMENTIN) 875-125 MG tablet; Take 1 tablet by mouth 2 (two) times daily.  Chronic rhinitis, unspecified type  Continue Flonase intranasal spray, Astelin nasal spray added. Continue Zyrtec 10 mg and Singulair 10 mg daily. Follow-up as needed.  -     azelastine (ASTELIN) 0.1 % nasal spray; Place 2 sprays into both nostrils 2 (two) times daily. Use in each nostril as directed -     fluticasone (FLONASE) 50 MCG/ACT nasal spray; Place 2 sprays into both nostrils daily.  Hypothyroidism, unspecified type  No changes in current management.  Lab Results  Component Value Date   TSH 0.60 09/09/2015   Instructed to keep follow-up appointment, when we will recheck TSH.  -     levothyroxine (SYNTHROID, LEVOTHROID) 50 MCG tablet; Take 1 tablet (50 mcg total) by mouth daily.  Bipolar 1 disorder, mixed, moderate (HCC)  Diagnosis changed, explained that I mention it last OV because it was the first we  met and part of plan of care. I am not treating this problem, so I am not aware of her status. I explained that psychiatrists will be filling disability forms if any is needed.  She could sign a release form ,so I can receive records with Dx's for updating problem list.  Atrophic vaginitis  Continue estradiol vaginal cream 3  times per week. Last mammogram 04/24/2015 birads 1.  -     estradiol (ESTRACE) 0.1 MG/GM vaginal cream; Place 1 Applicatorful vaginally 3 (three) times a week.        Return if symptoms worsen or fail to improve.     -Ms.Myosha Cuadras Mitchener was advised to return or notify a doctor immediately if symptoms worsen or persist or new concerns arise, otherwise she will keep follow-up appointment.       Latesha Chesney G. [place], MD  [facility]. [place] office.

## 2016-02-06 ENCOUNTER — Other Ambulatory Visit: Payer: Self-pay | Admitting: Family Medicine

## 2016-02-06 DIAGNOSIS — G43809 Other migraine, not intractable, without status migrainosus: Secondary | ICD-10-CM

## 2016-02-06 NOTE — Telephone Encounter (Signed)
Pt needs new rx rizatriptan 10 mg disintegrating tablet #9 w/refills call into new pharm [facility]

## 2016-02-10 MED ORDER — RIZATRIPTAN BENZOATE 10 MG PO TBDP
10.0000 mg | ORAL_TABLET | Freq: Every day | ORAL | 3 refills | Status: DC | PRN
Start: 2016-02-10 — End: 2016-02-11

## 2016-02-10 NOTE — Telephone Encounter (Signed)
Rx sent to pharmacy   

## 2016-02-11 ENCOUNTER — Other Ambulatory Visit: Payer: Self-pay

## 2016-02-11 DIAGNOSIS — G43809 Other migraine, not intractable, without status migrainosus: Secondary | ICD-10-CM

## 2016-02-11 MED ORDER — RIZATRIPTAN BENZOATE 10 MG PO TBDP: 10.0000 mg | ORAL_TABLET | Freq: Every day | ORAL | 3 refills | Status: DC | PRN

## 2016-02-12 DIAGNOSIS — G4733 Obstructive sleep apnea (adult) (pediatric): Secondary | ICD-10-CM | POA: Diagnosis not present

## 2016-02-16 DIAGNOSIS — G4733 Obstructive sleep apnea (adult) (pediatric): Secondary | ICD-10-CM | POA: Diagnosis not present

## 2016-02-17 DIAGNOSIS — R69 Illness, unspecified: Secondary | ICD-10-CM | POA: Diagnosis not present

## 2016-02-18 DIAGNOSIS — R69 Illness, unspecified: Secondary | ICD-10-CM | POA: Diagnosis not present

## 2016-02-18 DIAGNOSIS — F411 Generalized anxiety disorder: Secondary | ICD-10-CM | POA: Diagnosis not present

## 2016-02-18 DIAGNOSIS — F423 Hoarding disorder: Secondary | ICD-10-CM | POA: Diagnosis not present

## 2016-03-01 NOTE — Patient Instructions (Addendum)
[name]  03/01/2016   Your procedure is scheduled on: Thursday 03/04/2016  Report to [facility]  Entrance take [place]  elevators to 3rd floor to  [place] at   [place]  AM.  Call this number if you have problems the morning of surgery [phone]   Remember: ONLY 1 PERSON MAY GO WITH YOU TO SHORT STAY TO GET  READY MORNING OF [place].   Do not eat food or drink liquids :After Midnight.  Bring CPAP mask and tubing with you to hospital morning of surgery!   Take these medicines the morning of surgery with A SIP OF WATER: Lithium, Zoloft, Geodon,Levothyroxine,  use Albuterol inhaler if needed, use Symbicort inhaler if needed, use Flonase nasal spray                                  You may not have any metal on your body including hair pins and              piercings  Do not wear jewelry, make-up, lotions, powders or perfumes, deodorant             Do not wear nail polish.  Do not shave  48 hours prior to surgery.              Men may shave face and neck.   Do not bring valuables to the hospital. [place].  Contacts, dentures or bridgework may not be worn into surgery.  Leave suitcase in the car. After surgery it may be brought to your room.                  Please read over the following fact sheets you were given: _____________________________________________________________________             [facility] - Preparing for Surgery Before surgery, you can play an important role.  Because skin is not sterile, your skin needs to be as free of germs as possible.  You can reduce the number of germs on your skin by washing with CHG (chlorahexidine gluconate) soap before surgery.  CHG is an antiseptic cleaner which kills germs and bonds with the skin to continue killing germs even after washing. Please DO NOT use if you have an allergy to CHG or antibacterial soaps.  If your skin becomes  reddened/irritated stop using the CHG and inform your nurse when you arrive at Short Stay. Do not shave (including legs and underarms) for at least 48 hours prior to the first CHG shower.  You may shave your face/neck. Please follow these instructions carefully:  1.  Shower with CHG Soap the night before surgery and the  morning of Surgery.  2.  If you choose to wash your hair, wash your hair first as usual with your  normal  shampoo.  3.  After you shampoo, rinse your hair and body thoroughly to remove the  shampoo.                           4.  Use CHG as you would any other liquid soap.  You can apply chg directly  to the skin and wash  Gently with a scrungie or clean washcloth.  5.  Apply the CHG Soap to your body ONLY FROM THE NECK DOWN.   Do not use on face/ open                           Wound or open sores. Avoid contact with eyes, ears mouth and genitals (private parts).                       Wash face,  Genitals (private parts) with your normal soap.             6.  Wash thoroughly, paying special attention to the area where your surgery  will be performed.  7.  Thoroughly rinse your body with warm water from the neck down.  8.  DO NOT shower/wash with your normal soap after using and rinsing off  the CHG Soap.                9.  Pat yourself dry with a clean towel.            10.  Wear clean pajamas.            11.  Place clean sheets on your bed the night of your first shower and do not  sleep with pets. Day of Surgery : Do not apply any lotions/deodorants the morning of surgery.  Please wear clean clothes to the hospital/surgery center.  FAILURE TO FOLLOW THESE INSTRUCTIONS MAY RESULT IN THE CANCELLATION OF YOUR SURGERY PATIENT SIGNATURE_________________________________  NURSE SIGNATURE__________________________________  ________________________________________________________________________

## 2016-03-03 ENCOUNTER — Encounter (HOSPITAL_COMMUNITY)
Admission: RE | Admit: 2016-03-03 | Discharge: 2016-03-03 | Disposition: A | Discharge status: Home / Self Care | Payer: Medicare HMO | Source: Ambulatory Visit | Attending: Surgery | Admitting: Surgery

## 2016-03-03 ENCOUNTER — Encounter (HOSPITAL_COMMUNITY): Payer: Self-pay

## 2016-03-03 DIAGNOSIS — Z7951 Long term (current) use of inhaled steroids: Secondary | ICD-10-CM | POA: Diagnosis not present

## 2016-03-03 DIAGNOSIS — Z01810 Encounter for preprocedural cardiovascular examination: Secondary | ICD-10-CM

## 2016-03-03 DIAGNOSIS — G4733 Obstructive sleep apnea (adult) (pediatric): Secondary | ICD-10-CM | POA: Diagnosis not present

## 2016-03-03 DIAGNOSIS — Z9049 Acquired absence of other specified parts of digestive tract: Secondary | ICD-10-CM | POA: Diagnosis not present

## 2016-03-03 DIAGNOSIS — J45909 Unspecified asthma, uncomplicated: Secondary | ICD-10-CM | POA: Diagnosis not present

## 2016-03-03 DIAGNOSIS — F3162 Bipolar disorder, current episode mixed, moderate: Secondary | ICD-10-CM | POA: Diagnosis not present

## 2016-03-03 DIAGNOSIS — F419 Anxiety disorder, unspecified: Secondary | ICD-10-CM | POA: Diagnosis not present

## 2016-03-03 DIAGNOSIS — Z79899 Other long term (current) drug therapy: Secondary | ICD-10-CM | POA: Diagnosis not present

## 2016-03-03 DIAGNOSIS — E669 Obesity, unspecified: Secondary | ICD-10-CM | POA: Diagnosis not present

## 2016-03-03 DIAGNOSIS — K449 Diaphragmatic hernia without obstruction or gangrene: Secondary | ICD-10-CM

## 2016-03-03 DIAGNOSIS — Z79890 Hormone replacement therapy (postmenopausal): Secondary | ICD-10-CM | POA: Diagnosis not present

## 2016-03-03 DIAGNOSIS — Z01812 Encounter for preprocedural laboratory examination: Secondary | ICD-10-CM | POA: Insufficient documentation

## 2016-03-03 DIAGNOSIS — E039 Hypothyroidism, unspecified: Secondary | ICD-10-CM | POA: Diagnosis not present

## 2016-03-03 DIAGNOSIS — R69 Illness, unspecified: Secondary | ICD-10-CM | POA: Diagnosis not present

## 2016-03-03 DIAGNOSIS — I10 Essential (primary) hypertension: Secondary | ICD-10-CM | POA: Diagnosis not present

## 2016-03-03 HISTORY — DX: Other specified postprocedural states: Z98.890

## 2016-03-03 HISTORY — DX: Personal history of other diseases of the digestive system: Z87.19

## 2016-03-03 HISTORY — DX: Nausea with vomiting, unspecified: R11.2

## 2016-03-03 LAB — BASIC METABOLIC PANEL
ANION GAP: 8 (ref 5–15)
BUN: 11 mg/dL (ref 6–20)
CO2: 28 mmol/L (ref 22–32)
Calcium: 9.3 mg/dL (ref 8.9–10.3)
Chloride: 104 mmol/L (ref 101–111)
Creatinine, Ser: 0.92 mg/dL (ref 0.44–1.00)
GFR calc non Af Amer: >60 mL/min (ref >60)
Glucose, Bld: 109 mg/dL — ABNORMAL HIGH (ref 65–99)
POTASSIUM: 4.4 mmol/L (ref 3.5–5.1)
Sodium: 140 mmol/L (ref 135–145)

## 2016-03-03 LAB — CBC
HEMATOCRIT: 35.9 % — AB (ref 36.0–46.0)
HEMOGLOBIN: 11.5 g/dL — AB (ref 12.0–15.0)
MCH: 27.3 pg (ref 26.0–34.0)
MCHC: 32 g/dL (ref 30.0–36.0)
MCV: 85.1 fL (ref 78.0–100.0)
Platelets: 304 10*3/uL (ref 150–400)
RBC: 4.22 MIL/uL (ref 3.87–5.11)
RDW: 14.2 % (ref 11.5–15.5)
WBC: 7.1 10*3/uL (ref 4.0–10.5)

## 2016-03-03 LAB — ABO/RH: ABO/RH(D): A POS

## 2016-03-03 NOTE — Anesthesia Preprocedure Evaluation (Addendum)
Anesthesia Evaluation  Patient identified by MRN, date of birth, ID band Patient awake    Reviewed: Allergy & Precautions, H&P , Patient's Chart, lab work & pertinent test results, reviewed documented beta blocker date and time   Airway Mallampati: II  TM Distance: >3 FB Neck ROM: full    Dental no notable dental hx.    Pulmonary    Pulmonary exam normal breath sounds clear to auscultation       Cardiovascular hypertension,  Rhythm:regular Rate:Normal     Neuro/Psych    GI/Hepatic   Endo/Other    Renal/GU      Musculoskeletal   Abdominal   Peds  Hematology   Anesthesia Other Findings PONV (postoperative nausea and vomiting)    Bipolar 1 disorder (HCC)     Complication of anesthesia   Asthma      GERD  Neuromuscular disorder   Hypothyroidism     Depression    Hypertension     Sleep apnea  has cpap,        Reproductive/Obstetrics                             Anesthesia Physical Anesthesia Plan  ASA: III  Anesthesia Plan: General   Post-op Pain Management:    Induction: Intravenous  Airway Management Planned: Oral ETT  Additional Equipment:   Intra-op Plan:   Post-operative Plan: Extubation in OR  Informed Consent: I have reviewed the patients History and Physical, chart, labs and discussed the procedure including the risks, benefits and alternatives for the proposed anesthesia with the patient or authorized representative who has indicated his/her understanding and acceptance.   Dental Advisory Given and Dental advisory given  Plan Discussed with: CRNA and Surgeon  Anesthesia Plan Comments: (  Discussed general anesthesia, including possible nausea, instrumentation of airway, sore throat,pulmonary aspiration, etc. I asked if the were any outstanding questions, or  concerns before we proceeded.)        Anesthesia Quick Evaluation

## 2016-03-04 ENCOUNTER — Encounter (HOSPITAL_COMMUNITY): Payer: Self-pay | Admitting: *Deleted

## 2016-03-04 ENCOUNTER — Observation Stay (HOSPITAL_COMMUNITY)
Admission: RE | Admit: 2016-03-04 | Discharge: 2016-03-05 | Disposition: A | Discharge status: Home / Self Care | Payer: Medicare HMO | Source: Ambulatory Visit | Attending: Surgery | Admitting: Surgery

## 2016-03-04 ENCOUNTER — Encounter (HOSPITAL_COMMUNITY)
Admission: RE | Disposition: A | Discharge status: Home / Self Care | Payer: Self-pay | Source: Ambulatory Visit | Attending: Surgery

## 2016-03-04 ENCOUNTER — Ambulatory Visit (HOSPITAL_COMMUNITY): Payer: Medicare HMO | Admitting: Anesthesiology

## 2016-03-04 DIAGNOSIS — E039 Hypothyroidism, unspecified: Secondary | ICD-10-CM | POA: Insufficient documentation

## 2016-03-04 DIAGNOSIS — Z9049 Acquired absence of other specified parts of digestive tract: Secondary | ICD-10-CM | POA: Insufficient documentation

## 2016-03-04 DIAGNOSIS — F419 Anxiety disorder, unspecified: Secondary | ICD-10-CM | POA: Insufficient documentation

## 2016-03-04 DIAGNOSIS — J45909 Unspecified asthma, uncomplicated: Secondary | ICD-10-CM | POA: Insufficient documentation

## 2016-03-04 DIAGNOSIS — M25561 Pain in right knee: Secondary | ICD-10-CM | POA: Diagnosis present

## 2016-03-04 DIAGNOSIS — J4540 Moderate persistent asthma, uncomplicated: Secondary | ICD-10-CM | POA: Diagnosis present

## 2016-03-04 DIAGNOSIS — M25562 Pain in left knee: Secondary | ICD-10-CM

## 2016-03-04 DIAGNOSIS — Z79890 Hormone replacement therapy (postmenopausal): Secondary | ICD-10-CM | POA: Diagnosis not present

## 2016-03-04 DIAGNOSIS — I10 Essential (primary) hypertension: Secondary | ICD-10-CM | POA: Diagnosis present

## 2016-03-04 DIAGNOSIS — G4733 Obstructive sleep apnea (adult) (pediatric): Secondary | ICD-10-CM | POA: Insufficient documentation

## 2016-03-04 DIAGNOSIS — Z6832 Body mass index (BMI) 32.0-32.9, adult: Secondary | ICD-10-CM | POA: Diagnosis present

## 2016-03-04 DIAGNOSIS — Z7951 Long term (current) use of inhaled steroids: Secondary | ICD-10-CM | POA: Insufficient documentation

## 2016-03-04 DIAGNOSIS — Z9989 Dependence on other enabling machines and devices: Secondary | ICD-10-CM | POA: Diagnosis present

## 2016-03-04 DIAGNOSIS — G479 Sleep disorder, unspecified: Secondary | ICD-10-CM | POA: Diagnosis present

## 2016-03-04 DIAGNOSIS — K219 Gastro-esophageal reflux disease without esophagitis: Secondary | ICD-10-CM | POA: Diagnosis present

## 2016-03-04 DIAGNOSIS — E669 Obesity, unspecified: Secondary | ICD-10-CM | POA: Diagnosis not present

## 2016-03-04 DIAGNOSIS — K589 Irritable bowel syndrome without diarrhea: Secondary | ICD-10-CM | POA: Diagnosis present

## 2016-03-04 DIAGNOSIS — K449 Diaphragmatic hernia without obstruction or gangrene: Secondary | ICD-10-CM | POA: Diagnosis not present

## 2016-03-04 DIAGNOSIS — R69 Illness, unspecified: Secondary | ICD-10-CM | POA: Diagnosis not present

## 2016-03-04 DIAGNOSIS — F3162 Bipolar disorder, current episode mixed, moderate: Secondary | ICD-10-CM | POA: Diagnosis present

## 2016-03-04 DIAGNOSIS — G4730 Sleep apnea, unspecified: Secondary | ICD-10-CM | POA: Diagnosis present

## 2016-03-04 DIAGNOSIS — Z79899 Other long term (current) drug therapy: Secondary | ICD-10-CM | POA: Insufficient documentation

## 2016-03-04 SURGERY — FUNDOPLICATION, NISSEN, ROBOT-ASSISTED, LAPAROSCOPIC
Anesthesia: General | Site: Abdomen

## 2016-03-04 MED ORDER — AZELASTINE HCL 0.1 % NA SOLN
2.0000 | Freq: Every day | NASAL | Status: DC
  Administered 2016-03-04 – 2016-03-05 (×2): 2 via NASAL
  Filled 2016-03-04: qty 30

## 2016-03-04 MED ORDER — CLONAZEPAM 0.5 MG PO TABS: 0.5000 mg | ORAL_TABLET | Freq: Every evening | ORAL | Status: DC | PRN

## 2016-03-04 MED ORDER — FENTANYL CITRATE (PF) 100 MCG/2ML IJ SOLN
25.0000 ug | INTRAMUSCULAR | Status: DC | PRN
  Administered 2016-03-04 (×3): 50 ug via INTRAVENOUS

## 2016-03-04 MED ORDER — PROPOFOL 10 MG/ML IV BOLUS
INTRAVENOUS | Status: AC
  Filled 2016-03-04: qty 20

## 2016-03-04 MED ORDER — METOPROLOL TARTRATE 5 MG/5ML IV SOLN
5.0000 mg | Freq: Four times a day (QID) | INTRAVENOUS | Status: DC | PRN
  Administered 2016-03-05: 5 mg via INTRAVENOUS
  Filled 2016-03-04: qty 5

## 2016-03-04 MED ORDER — PHENYLEPHRINE HCL 10 MG/ML IJ SOLN
INTRAMUSCULAR | Status: DC | PRN
  Administered 2016-03-04 (×3): 40 ug via INTRAVENOUS

## 2016-03-04 MED ORDER — PHENYLEPHRINE 40 MCG/ML (10ML) SYRINGE FOR IV PUSH (FOR BLOOD PRESSURE SUPPORT)
PREFILLED_SYRINGE | INTRAVENOUS | Status: AC
  Filled 2016-03-04: qty 10

## 2016-03-04 MED ORDER — GABAPENTIN 300 MG PO CAPS
300.0000 mg | ORAL_CAPSULE | ORAL | Status: DC
  Filled 2016-03-04: qty 1

## 2016-03-04 MED ORDER — MENTHOL 3 MG MT LOZG: 1.0000 | LOZENGE | OROMUCOSAL | Status: DC | PRN

## 2016-03-04 MED ORDER — HYDRALAZINE HCL 20 MG/ML IJ SOLN: 5.0000 mg | Freq: Four times a day (QID) | INTRAMUSCULAR | Status: DC | PRN

## 2016-03-04 MED ORDER — PROMETHAZINE HCL 25 MG/ML IJ SOLN
INTRAMUSCULAR | Status: AC
  Administered 2016-03-04: 6.25 mg via INTRAVENOUS
  Filled 2016-03-04: qty 1

## 2016-03-04 MED ORDER — PHENOL 1.4 % MT LIQD
2.0000 | OROMUCOSAL | Status: DC | PRN
  Filled 2016-03-04: qty 177

## 2016-03-04 MED ORDER — EMETROL 1.87-1.87-21.5 PO SOLN
30.0000 mL | ORAL | Status: DC | PRN
  Filled 2016-03-04: qty 118

## 2016-03-04 MED ORDER — MIDAZOLAM HCL 5 MG/5ML IJ SOLN
INTRAMUSCULAR | Status: DC | PRN
  Administered 2016-03-04: 2 mg via INTRAVENOUS

## 2016-03-04 MED ORDER — DEXAMETHASONE SODIUM PHOSPHATE 10 MG/ML IJ SOLN
INTRAMUSCULAR | Status: AC
  Administered 2016-03-04: 10 mg via INTRAVENOUS
  Filled 2016-03-04: qty 1

## 2016-03-04 MED ORDER — SODIUM CHLORIDE 0.9 % IJ SOLN
INTRAMUSCULAR | Status: DC | PRN
  Administered 2016-03-04: 30 mL

## 2016-03-04 MED ORDER — RIZATRIPTAN BENZOATE 10 MG PO TBDP: 10.0000 mg | ORAL_TABLET | Freq: Every day | ORAL | Status: DC | PRN

## 2016-03-04 MED ORDER — DEXAMETHASONE SODIUM PHOSPHATE 10 MG/ML IJ SOLN
10.0000 mg | Freq: Once | INTRAMUSCULAR | Status: AC
  Administered 2016-03-04: 10 mg via INTRAVENOUS

## 2016-03-04 MED ORDER — SUGAMMADEX SODIUM 200 MG/2ML IV SOLN
INTRAVENOUS | Status: DC | PRN
  Administered 2016-03-04: 200 mg via INTRAVENOUS

## 2016-03-04 MED ORDER — OXYCODONE HCL 5 MG PO TABS: 5.0000 mg | ORAL_TABLET | ORAL | Status: DC | PRN

## 2016-03-04 MED ORDER — ROCURONIUM BROMIDE 50 MG/5ML IV SOSY
PREFILLED_SYRINGE | INTRAVENOUS | Status: AC
  Filled 2016-03-04: qty 5

## 2016-03-04 MED ORDER — SUGAMMADEX SODIUM 200 MG/2ML IV SOLN
INTRAVENOUS | Status: AC
  Filled 2016-03-04: qty 2

## 2016-03-04 MED ORDER — ONDANSETRON HCL 4 MG/2ML IJ SOLN
INTRAMUSCULAR | Status: DC | PRN
  Administered 2016-03-04: 4 mg via INTRAVENOUS

## 2016-03-04 MED ORDER — MONTELUKAST SODIUM 10 MG PO TABS
10.0000 mg | ORAL_TABLET | Freq: Every day | ORAL | Status: DC
  Administered 2016-03-04: 10 mg via ORAL
  Filled 2016-03-04: qty 1

## 2016-03-04 MED ORDER — HYDROCORTISONE 1 % EX CREA
1.0000 "application " | TOPICAL_CREAM | Freq: Two times a day (BID) | CUTANEOUS | Status: DC | PRN
  Filled 2016-03-04: qty 28

## 2016-03-04 MED ORDER — METOCLOPRAMIDE HCL 5 MG/ML IJ SOLN: 5.0000 mg | Freq: Four times a day (QID) | INTRAMUSCULAR | Status: DC | PRN

## 2016-03-04 MED ORDER — ONDANSETRON HCL 4 MG/2ML IJ SOLN
4.0000 mg | Freq: Once | INTRAMUSCULAR | Status: AC
  Administered 2016-03-04: 4 mg via INTRAVENOUS

## 2016-03-04 MED ORDER — LIP MEDEX EX OINT
1.0000 "application " | TOPICAL_OINTMENT | Freq: Two times a day (BID) | CUTANEOUS | Status: DC
  Administered 2016-03-04 – 2016-03-05 (×2): 1 via TOPICAL
  Filled 2016-03-04: qty 7

## 2016-03-04 MED ORDER — PROMETHAZINE HCL 25 MG RE SUPP: 25.0000 mg | Freq: Four times a day (QID) | RECTAL | 5 refills | Status: DC | PRN

## 2016-03-04 MED ORDER — SODIUM CHLORIDE 0.9 % IJ SOLN
INTRAMUSCULAR | Status: AC
  Filled 2016-03-04: qty 50

## 2016-03-04 MED ORDER — SODIUM CHLORIDE 0.9 % IV SOLN: 25.0000 mg | Freq: Four times a day (QID) | INTRAVENOUS | Status: DC | PRN

## 2016-03-04 MED ORDER — METHOCARBAMOL 500 MG PO TABS
500.0000 mg | ORAL_TABLET | Freq: Four times a day (QID) | ORAL | Status: DC | PRN
  Filled 2016-03-04: qty 1

## 2016-03-04 MED ORDER — ENOXAPARIN SODIUM 40 MG/0.4ML ~~LOC~~ SOLN
40.0000 mg | SUBCUTANEOUS | Status: DC
  Administered 2016-03-05: 40 mg via SUBCUTANEOUS
  Filled 2016-03-04: qty 0.4

## 2016-03-04 MED ORDER — FLUTICASONE PROPIONATE 50 MCG/ACT NA SUSP
2.0000 | Freq: Every day | NASAL | Status: DC
  Administered 2016-03-05: 2 via NASAL
  Filled 2016-03-04: qty 16

## 2016-03-04 MED ORDER — ONDANSETRON HCL 4 MG PO TABS: 4.0000 mg | ORAL_TABLET | Freq: Three times a day (TID) | ORAL | 5 refills | Status: DC | PRN

## 2016-03-04 MED ORDER — FENTANYL CITRATE (PF) 250 MCG/5ML IJ SOLN
INTRAMUSCULAR | Status: AC
  Filled 2016-03-04: qty 5

## 2016-03-04 MED ORDER — ONDANSETRON HCL 4 MG/2ML IJ SOLN
INTRAMUSCULAR | Status: AC
  Filled 2016-03-04: qty 2

## 2016-03-04 MED ORDER — LORAZEPAM 2 MG/ML IJ SOLN: 0.5000 mg | Freq: Three times a day (TID) | INTRAMUSCULAR | Status: DC | PRN

## 2016-03-04 MED ORDER — LITHIUM CARBONATE 150 MG PO CAPS
150.0000 mg | ORAL_CAPSULE | Freq: Three times a day (TID) | ORAL | Status: DC
  Administered 2016-03-04 – 2016-03-05 (×2): 150 mg via ORAL
  Filled 2016-03-04 (×4): qty 1

## 2016-03-04 MED ORDER — PROPOFOL 10 MG/ML IV BOLUS
INTRAVENOUS | Status: DC | PRN
  Administered 2016-03-04: 150 mg via INTRAVENOUS

## 2016-03-04 MED ORDER — SERTRALINE HCL 100 MG PO TABS
200.0000 mg | ORAL_TABLET | Freq: Every day | ORAL | Status: DC
  Administered 2016-03-04 – 2016-03-05 (×2): 200 mg via ORAL
  Filled 2016-03-04 (×2): qty 2

## 2016-03-04 MED ORDER — ENALAPRILAT 1.25 MG/ML IV SOLN
0.6250 mg | Freq: Four times a day (QID) | INTRAVENOUS | Status: DC | PRN
  Filled 2016-03-04: qty 1

## 2016-03-04 MED ORDER — TETRACAINE HCL 1 % IJ SOLN
50.0000 mg | Freq: Once | INTRAMUSCULAR | Status: DC
Start: 2016-03-04 — End: 2016-03-04
  Filled 2016-03-04: qty 6

## 2016-03-04 MED ORDER — ESTRADIOL 0.1 MG/GM VA CREA
1.0000 | TOPICAL_CREAM | VAGINAL | Status: DC
  Administered 2016-03-05: 1 via VAGINAL
  Filled 2016-03-04: qty 42.5

## 2016-03-04 MED ORDER — AMITRIPTYLINE HCL 25 MG PO TABS
25.0000 mg | ORAL_TABLET | Freq: Every day | ORAL | Status: DC
  Administered 2016-03-04: 25 mg via ORAL
  Filled 2016-03-04 (×2): qty 1

## 2016-03-04 MED ORDER — BISMUTH SUBSALICYLATE 262 MG/15ML PO SUSP
30.0000 mL | Freq: Three times a day (TID) | ORAL | Status: DC | PRN
  Filled 2016-03-04: qty 236

## 2016-03-04 MED ORDER — ZIPRASIDONE HCL 20 MG PO CAPS
20.0000 mg | ORAL_CAPSULE | Freq: Two times a day (BID) | ORAL | Status: DC
  Administered 2016-03-04 – 2016-03-05 (×2): 20 mg via ORAL
  Filled 2016-03-04 (×3): qty 1

## 2016-03-04 MED ORDER — ONDANSETRON HCL 4 MG/2ML IJ SOLN: 4.0000 mg | Freq: Four times a day (QID) | INTRAMUSCULAR | Status: DC | PRN

## 2016-03-04 MED ORDER — HYDROMORPHONE HCL 2 MG/ML IJ SOLN
0.5000 mg | INTRAMUSCULAR | Status: DC | PRN
  Administered 2016-03-04: 2 mg via INTRAVENOUS
  Administered 2016-03-05 (×2): 1 mg via INTRAVENOUS
  Filled 2016-03-04 (×3): qty 1

## 2016-03-04 MED ORDER — HYDRALAZINE HCL 20 MG/ML IJ SOLN
10.0000 mg | INTRAMUSCULAR | Status: DC | PRN
  Filled 2016-03-04: qty 1

## 2016-03-04 MED ORDER — LACTATED RINGERS IR SOLN
Status: DC | PRN
  Administered 2016-03-04: 1000 mL

## 2016-03-04 MED ORDER — SUMATRIPTAN SUCCINATE 100 MG PO TABS
100.0000 mg | ORAL_TABLET | ORAL | Status: DC | PRN
  Filled 2016-03-04 (×2): qty 1

## 2016-03-04 MED ORDER — ONDANSETRON 4 MG PO TBDP: 4.0000 mg | ORAL_TABLET | Freq: Four times a day (QID) | ORAL | Status: DC | PRN

## 2016-03-04 MED ORDER — OXYCODONE HCL 5 MG PO TABS: 5.0000 mg | ORAL_TABLET | Freq: Four times a day (QID) | ORAL | 0 refills | Status: DC | PRN

## 2016-03-04 MED ORDER — 0.9 % SODIUM CHLORIDE (POUR BTL) OPTIME
TOPICAL | Status: DC | PRN
  Administered 2016-03-04: 1000 mL

## 2016-03-04 MED ORDER — SODIUM CHLORIDE 0.9 % IV SOLN
25.0000 mg | Freq: Four times a day (QID) | INTRAVENOUS | Status: DC | PRN
  Filled 2016-03-04: qty 1

## 2016-03-04 MED ORDER — SIMETHICONE 80 MG PO CHEW: 40.0000 mg | CHEWABLE_TABLET | Freq: Four times a day (QID) | ORAL | Status: DC | PRN

## 2016-03-04 MED ORDER — ALBUTEROL SULFATE (2.5 MG/3ML) 0.083% IN NEBU
3.0000 mL | INHALATION_SOLUTION | Freq: Four times a day (QID) | RESPIRATORY_TRACT | Status: DC | PRN
Start: 2016-03-04 — End: 2016-03-05

## 2016-03-04 MED ORDER — ACETAMINOPHEN 500 MG PO TABS
1000.0000 mg | ORAL_TABLET | Freq: Three times a day (TID) | ORAL | Status: DC
  Administered 2016-03-04 – 2016-03-05 (×2): 1000 mg via ORAL
  Filled 2016-03-04 (×3): qty 2

## 2016-03-04 MED ORDER — ROCURONIUM BROMIDE 100 MG/10ML IV SOLN
INTRAVENOUS | Status: DC | PRN
  Administered 2016-03-04 (×2): 10 mg via INTRAVENOUS
  Administered 2016-03-04: 20 mg via INTRAVENOUS
  Administered 2016-03-04: 10 mg via INTRAVENOUS
  Administered 2016-03-04: 50 mg via INTRAVENOUS
  Administered 2016-03-04: 10 mg via INTRAVENOUS

## 2016-03-04 MED ORDER — LEVOTHYROXINE SODIUM 50 MCG PO TABS
50.0000 ug | ORAL_TABLET | Freq: Every day | ORAL | Status: DC
  Administered 2016-03-05: 50 ug via ORAL
  Filled 2016-03-04: qty 1

## 2016-03-04 MED ORDER — TETRACAINE HCL 1 % IJ SOLN
INTRAMUSCULAR | Status: DC | PRN
  Administered 2016-03-04: 10 mg via INTRADERMAL

## 2016-03-04 MED ORDER — LACTATED RINGERS IV SOLN
INTRAVENOUS | Status: AC
  Administered 2016-03-04: 17:00:00 via INTRAVENOUS

## 2016-03-04 MED ORDER — GABAPENTIN 300 MG PO CAPS
300.0000 mg | ORAL_CAPSULE | Freq: Three times a day (TID) | ORAL | Status: DC
  Administered 2016-03-04 – 2016-03-05 (×3): 300 mg via ORAL
  Filled 2016-03-04 (×4): qty 1

## 2016-03-04 MED ORDER — MIDAZOLAM HCL 2 MG/2ML IJ SOLN
INTRAMUSCULAR | Status: AC
  Filled 2016-03-04: qty 2

## 2016-03-04 MED ORDER — MAGIC MOUTHWASH
15.0000 mL | Freq: Four times a day (QID) | ORAL | Status: DC | PRN
  Filled 2016-03-04: qty 15

## 2016-03-04 MED ORDER — FENTANYL CITRATE (PF) 100 MCG/2ML IJ SOLN
INTRAMUSCULAR | Status: DC | PRN
  Administered 2016-03-04 (×2): 50 ug via INTRAVENOUS
  Administered 2016-03-04: 100 ug via INTRAVENOUS

## 2016-03-04 MED ORDER — CEFOTETAN DISODIUM-DEXTROSE 2-2.08 GM-% IV SOLR
2.0000 g | INTRAVENOUS | Status: AC
  Administered 2016-03-04: 2 g via INTRAVENOUS

## 2016-03-04 MED ORDER — FENTANYL CITRATE (PF) 100 MCG/2ML IJ SOLN
INTRAMUSCULAR | Status: AC
  Administered 2016-03-04: 50 ug via INTRAVENOUS
  Filled 2016-03-04: qty 2

## 2016-03-04 MED ORDER — PROMETHAZINE HCL 25 MG/ML IJ SOLN
6.2500 mg | INTRAMUSCULAR | Status: DC | PRN
  Administered 2016-03-04: 6.25 mg via INTRAVENOUS

## 2016-03-04 MED ORDER — METHOCARBAMOL 1000 MG/10ML IJ SOLN
1000.0000 mg | Freq: Four times a day (QID) | INTRAVENOUS | Status: DC | PRN
  Filled 2016-03-04: qty 10

## 2016-03-04 MED ORDER — CEFOTETAN DISODIUM-DEXTROSE 2-2.08 GM-% IV SOLR
INTRAVENOUS | Status: AC
  Filled 2016-03-04: qty 50

## 2016-03-04 MED ORDER — LACTATED RINGERS IV SOLN
INTRAVENOUS | Status: DC | PRN
  Administered 2016-03-04 (×3): via INTRAVENOUS

## 2016-03-04 MED ORDER — PROCHLORPERAZINE EDISYLATE 5 MG/ML IJ SOLN: 5.0000 mg | INTRAMUSCULAR | Status: DC | PRN

## 2016-03-04 MED ORDER — ALUM & MAG HYDROXIDE-SIMETH 200-200-20 MG/5ML PO SUSP: 30.0000 mL | Freq: Four times a day (QID) | ORAL | Status: DC | PRN

## 2016-03-04 MED ORDER — MOMETASONE FURO-FORMOTEROL FUM 100-5 MCG/ACT IN AERO
2.0000 | INHALATION_SPRAY | Freq: Two times a day (BID) | RESPIRATORY_TRACT | Status: DC
  Administered 2016-03-04 – 2016-03-05 (×2): 2 via RESPIRATORY_TRACT
  Filled 2016-03-04: qty 8.8

## 2016-03-04 MED ORDER — POLYETHYLENE GLYCOL 3350 17 G PO PACK: 17.0000 g | PACK | Freq: Two times a day (BID) | ORAL | Status: DC | PRN

## 2016-03-04 MED ORDER — ACETAMINOPHEN 500 MG PO TABS
1000.0000 mg | ORAL_TABLET | ORAL | Status: AC
  Administered 2016-03-04: 1000 mg via ORAL
  Filled 2016-03-04: qty 2

## 2016-03-04 MED ORDER — LACTATED RINGERS IV BOLUS (SEPSIS): 1000.0000 mL | Freq: Three times a day (TID) | INTRAVENOUS | Status: DC | PRN

## 2016-03-04 MED ORDER — OXYBUTYNIN CHLORIDE 5 MG PO TABS
5.0000 mg | ORAL_TABLET | Freq: Three times a day (TID) | ORAL | Status: DC
  Administered 2016-03-04 – 2016-03-05 (×3): 5 mg via ORAL
  Filled 2016-03-04 (×3): qty 1

## 2016-03-04 MED ORDER — METOPROLOL TARTRATE 12.5 MG HALF TABLET: 12.5000 mg | ORAL_TABLET | Freq: Two times a day (BID) | ORAL | Status: DC | PRN

## 2016-03-04 MED ORDER — ONDANSETRON HCL 4 MG/2ML IJ SOLN
INTRAMUSCULAR | Status: AC
  Administered 2016-03-04: 4 mg via INTRAVENOUS
  Filled 2016-03-04: qty 2

## 2016-03-04 MED ORDER — DIPHENHYDRAMINE HCL 50 MG/ML IJ SOLN: 12.5000 mg | Freq: Four times a day (QID) | INTRAMUSCULAR | Status: DC | PRN

## 2016-03-04 MED ORDER — DIPHENHYDRAMINE HCL 12.5 MG/5ML PO ELIX: 12.5000 mg | ORAL_SOLUTION | Freq: Four times a day (QID) | ORAL | Status: DC | PRN

## 2016-03-04 MED ORDER — LORATADINE 10 MG PO TABS
10.0000 mg | ORAL_TABLET | Freq: Every day | ORAL | Status: DC
  Administered 2016-03-04 – 2016-03-05 (×2): 10 mg via ORAL
  Filled 2016-03-04 (×2): qty 1

## 2016-03-04 SURGICAL SUPPLY — 61 items
APPLIER CLIP 5 13 M/L LIGAMAX5 (MISCELLANEOUS)
APPLIER CLIP ROT 10 11.4 M/L (STAPLE)
APR CLP MED LRG 11.4X10 (STAPLE)
APR CLP MED LRG 5 ANG JAW (MISCELLANEOUS)
BLADE SURG SZ11 CARB STEEL (BLADE) ×2 IMPLANT
CHLORAPREP W/TINT 26ML (MISCELLANEOUS) ×2 IMPLANT
CLIP APPLIE 5 13 M/L LIGAMAX5 (MISCELLANEOUS) IMPLANT
CLIP APPLIE ROT 10 11.4 M/L (STAPLE) IMPLANT
COVER TIP SHEARS 8 DVNC (MISCELLANEOUS) ×1 IMPLANT
COVER TIP SHEARS 8MM DA VINCI (MISCELLANEOUS) ×1
DECANTER SPIKE VIAL GLASS SM (MISCELLANEOUS) ×2 IMPLANT
DRAIN CHANNEL 19F RND (DRAIN) IMPLANT
DRAIN PENROSE 18X1/2 LTX STRL (DRAIN) IMPLANT
DRAPE ARM DVNC X/XI (DISPOSABLE) ×4 IMPLANT
DRAPE COLUMN DVNC XI (DISPOSABLE) ×1 IMPLANT
DRAPE DA VINCI XI ARM (DISPOSABLE) ×4
DRAPE DA VINCI XI COLUMN (DISPOSABLE) ×1
DRAPE WARM FLUID 44X44 (DRAPE) ×2 IMPLANT
DRSG TEGADERM 2-3/8X2-3/4 SM (GAUZE/BANDAGES/DRESSINGS) ×9 IMPLANT
ELECT REM PT RETURN 15FT ADLT (MISCELLANEOUS) ×2 IMPLANT
ENDOLOOP SUT PDS II  0 18 (SUTURE)
ENDOLOOP SUT PDS II 0 18 (SUTURE) IMPLANT
EVACUATOR SILICONE 100CC (DRAIN) IMPLANT
FELT TEFLON 4 X1 (Mesh General) ×1 IMPLANT
GAUZE SPONGE 2X2 8PLY STRL LF (GAUZE/BANDAGES/DRESSINGS) ×1 IMPLANT
GLOVE ECLIPSE 8.0 STRL XLNG CF (GLOVE) ×4 IMPLANT
GLOVE INDICATOR 8.0 STRL GRN (GLOVE) ×4 IMPLANT
GOWN STRL REUS W/TWL XL LVL3 (GOWN DISPOSABLE) ×8 IMPLANT
IRRIG SUCT STRYKERFLOW 2 WTIP (MISCELLANEOUS) ×2
IRRIGATION SUCT STRKRFLW 2 WTP (MISCELLANEOUS) ×1 IMPLANT
KIT BASIN OR (CUSTOM PROCEDURE TRAY) ×2 IMPLANT
NDL INSUFFLATION 14GA 120MM (NEEDLE) ×1 IMPLANT
NEEDLE HYPO 22GX1.5 SAFETY (NEEDLE) ×2 IMPLANT
NEEDLE INSUFFLATION 14GA 120MM (NEEDLE) ×2 IMPLANT
PACK CARDIOVASCULAR III (CUSTOM PROCEDURE TRAY) ×2 IMPLANT
PAD POSITIONING PINK XL (MISCELLANEOUS) ×2 IMPLANT
SCISSORS LAP 5X45 EPIX DISP (ENDOMECHANICALS) ×2 IMPLANT
SEAL CANN UNIV 5-8 DVNC XI (MISCELLANEOUS) ×4 IMPLANT
SEAL XI 5MM-8MM UNIVERSAL (MISCELLANEOUS) ×4
SEALER VESSEL DA VINCI XI (MISCELLANEOUS) ×1
SEALER VESSEL EXT DVNC XI (MISCELLANEOUS) ×1 IMPLANT
SET BI-LUMEN FLTR TB AIRSEAL (TUBING) ×2 IMPLANT
SOLUTION ELECTROLUBE (MISCELLANEOUS) ×2 IMPLANT
SPONGE GAUZE 2X2 STER 10/PKG (GAUZE/BANDAGES/DRESSINGS) ×1
SPONGE LAP 18X18 X RAY DECT (DISPOSABLE) ×2 IMPLANT
SUT ETHIBOND 0 36 GRN (SUTURE) ×6 IMPLANT
SUT ETHIBOND NAB CT1 #1 30IN (SUTURE) ×5 IMPLANT
SUT MNCRL AB 4-0 PS2 18 (SUTURE) ×3 IMPLANT
SUT PROLENE 2 0 SH DA (SUTURE) IMPLANT
SUT V-LOC BARB 180 2/0GR6 GS22 (SUTURE)
SUTURE V-LC BRB 180 2/0GR6GS22 (SUTURE) IMPLANT
SYR 10ML LL (SYRINGE) ×2 IMPLANT
SYR 20CC LL (SYRINGE) ×2 IMPLANT
TIP INNERVISION DETACH 40FR (MISCELLANEOUS) IMPLANT
TIP INNERVISION DETACH 50FR (MISCELLANEOUS) IMPLANT
TIP INNERVISION DETACH 56FR (MISCELLANEOUS) IMPLANT
TIPS INNERVISION DETACH 40FR (MISCELLANEOUS)
TOWEL OR 17X26 10 PK STRL BLUE (TOWEL DISPOSABLE) ×2 IMPLANT
TOWEL OR NON WOVEN STRL DISP B (DISPOSABLE) ×2 IMPLANT
TRAY FOLEY W/METER SILVER 14FR (SET/KITS/TRAYS/PACK) ×2 IMPLANT
TROCAR ADV FIXATION 5X100MM (TROCAR) ×2 IMPLANT

## 2016-03-04 NOTE — Anesthesia Postprocedure Evaluation (Signed)
Anesthesia Post Note  Patient: [name]  Procedure(s) Performed: Procedure(s) (LRB): XI ROBOTIC ASSISTED LAPAROSCOPIC PARAESOPHAGEAL HIATAL HERNIA WITH FUNDOPLICATION (N/A)  Patient location during evaluation: PACU Anesthesia Type: General Level of consciousness: sedated Pain management: satisfactory to patient Vital Signs Assessment: post-procedure vital signs reviewed and stable Respiratory status: spontaneous breathing Cardiovascular status: stable Anesthetic complications: no       Last Vitals:  Vitals:   03/04/16 1251 03/04/16 1357  BP: (!) 152/86 (!) 162/83  Pulse: 100 96  Resp: 16 16  Temp: 36.7 C 36.8 C    Last Pain:  Vitals:   03/04/16 1311  TempSrc:   PainSc: [place]

## 2016-03-04 NOTE — Progress Notes (Signed)
Pt has CPAP machine from home and wishes to utilize tonight.  RT inspected machine for obvious defects or damages, no were found.  Pt's CPAP is working properly at this time, RT to monitor and assess as needed.

## 2016-03-04 NOTE — Discharge Instructions (Signed)
EATING AFTER YOUR ESOPHAGEAL SURGERY (Stomach Fundoplication, Hiatal Hernia repair, Achalasia surgery, etc)  ######################################################################  EAT Start with a pureed / full liquid diet (see below) Gradually transition to a high fiber diet with a fiber supplement over the next month after discharge.    WALK Walk an hour a day.  Control your pain to do that.    CONTROL PAIN Control pain so that you can walk, sleep, tolerate sneezing/coughing, go up/down stairs.  HAVE A BOWEL MOVEMENT DAILY Keep your bowels regular to avoid problems.  OK to try a laxative to override constipation.  OK to use an antidairrheal to slow down diarrhea.  Call if not better after 2 tries  CALL IF YOU HAVE PROBLEMS/CONCERNS Call if you are still struggling despite following these instructions. Call if you have concerns not answered by these instructions  ######################################################################   After your esophageal surgery, expect some sticking with swallowing over the next 1-2 months.    If food sticks when you eat, it is called "dysphagia".  This is due to swelling around your esophagus at the wrap & hiatal diaphragm repair.  It will gradually ease off over the next few months.  To help you through this temporary phase, we start you out on a pureed (blenderized) diet.  Your first meal in the hospital was thin liquids.  You should have been given a pureed diet by the time you left the hospital.  We ask patients to stay on a pureed diet for the first 2-3 weeks to avoid anything getting "stuck" near your recent surgery.  Don't be alarmed if your ability to swallow doesn't progress according to this plan.  Everyone is different and some diets can advance more or less quickly.     Some BASIC RULES to follow are:  Maintain an upright position whenever eating or drinking.  Take small bites - just a teaspoon size bite at a time.  Eat slowly.   It may also help to eat only one food at a time.  Consider nibbling through smaller, more frequent meals & avoid the urge to eat BIG meals  Do not push through feelings of fullness, nausea, or bloatedness  Do not mix solid foods and liquids in the same mouthful  Try not to "wash foods down" with large gulps of liquids.  Avoid carbonated (bubbly/fizzy) drinks.    Avoid foods that make you feel gassy or bloated.  Start with bland foods first.  Wait on trying greasy, fried, or spicy meals until you are tolerating more bland solids well.  Understand that it will be hard to burp and belch at first.  This gradually improves with time.  Expect to be more gassy/flatulent/bloated initially.  Walking will help your body manage it better.  Consider using medications for bloating that contain simethicone such as  Maalox or Gas-X   Eat in a relaxed atmosphere & minimize distractions.  Avoid talking while eating.    Do not use straws.  Following each meal, sit in an upright position (90 degree angle) for 60 to 90 minutes.  Going for a short walk can help as well  If food does stick, don't panic.  Try to relax and let the food pass on its own.  Sipping WARM LIQUID such as strong hot black tea can also help slide it down.   Be gradual in changes & use common sense:  -If you easily tolerating a certain "level" of foods, advance to the next level gradually -If you are  having trouble swallowing a particular food, then avoid it.   -If food is sticking when you advance your diet, go back to thinner previous diet (the lower LEVEL) for 1-2 days.  LEVEL 1 = PUREED DIET  Do for the first 2 WEEKS AFTER SURGERY  -Foods in this group are pureed or blenderized to a smooth, mashed potato-like consistency.  -If necessary, the pureed foods can keep their shape with the addition of a thickening agent.   -Meat should be pureed to a smooth, pasty consistency.  Hot broth or gravy may be added to the pureed  meat, approximately 1 oz. of liquid per 3 oz. serving of meat. -CAUTION:  If any foods do not puree into a smooth consistency, swallowing will be more difficult.  (For example, nuts or seeds sometimes do not blend well.)  Hot Foods Cold Foods  Pureed scrambled eggs and cheese Pureed cottage cheese  Baby cereals Thickened juices and nectars  Thinned cooked cereals (no lumps) Thickened milk or eggnog  Pureed [place] toast or pancakes Ensure  Mashed potatoes Ice cream  Pureed parsley, au gratin, scalloped potatoes, candied sweet potatoes Fruit or [place] ice, sherbet  Pureed buttered or alfredo noodles Plain yogurt  Pureed vegetables (no corn or peas) Instant breakfast  Pureed soups and creamed soups Smooth pudding, mousse, custard  Pureed scalloped apples Whipped gelatin  Gravies Sugar, syrup, honey, jelly  Sauces, cheese, tomato, barbecue, white, creamed Cream  Any baby food Creamer  Alcohol in moderation (not beer or champagne) Margarine  Coffee or tea Mayonnaise   Ketchup, mustard   Apple sauce   SAMPLE MENU:  PUREED DIET Breakfast Lunch Dinner   Orange juice, 1/2 cup  Cream of wheat, 1/2 cup  Pineapple juice, 1/2 cup  Pureed [place], barley soup, 3/4 cup  Pureed Hawaiian chicken, 3 oz   Scrambled eggs, mashed or blended with cheese, 1/2 cup  Tea or coffee, 1 cup   Whole milk, 1 cup   Non-dairy creamer, 2 Tbsp.  Mashed potatoes, 1/2 cup  Pureed cooled broccoli, 1/2 cup  Apple sauce, 1/2 cup  Coffee or tea  Mashed potatoes, 1/2 cup  Pureed spinach, 1/2 cup  Frozen yogurt, 1/2 cup  Tea or coffee      LEVEL 2 = SOFT DIET  After your first 2 weeks, you can advance to a soft diet.   Keep on this diet until everything goes down easily.  Hot Foods Cold Foods  White fish Cottage cheese  Stuffed fish Junior baby fruit  Baby food meals Semi thickened juices  Minced soft cooked, scrambled, poached eggs nectars  Souffle & omelets Ripe mashed bananas  Cooked  cereals Canned fruit, pineapple sauce, milk  potatoes Milkshake  Buttered or Alfredo noodles Custard  Cooked cooled vegetable Puddings, including tapioca  Sherbet Yogurt  Vegetable soup or alphabet soup Fruit ice, [place] ice  Gravies Whipped gelatin  Sugar, syrup, honey, jelly Junior baby desserts  Sauces:  Cheese, creamed, barbecue, tomato, white Cream  Coffee or tea Margarine   SAMPLE MENU:  LEVEL 2 Breakfast Lunch Dinner   Orange juice, 1/2 cup  Oatmeal, 1/2 cup  Scrambled eggs with cheese, 1/2 cup  Decaffeinated tea, 1 cup  Whole milk, 1 cup  Non-dairy creamer, 2 Tbsp  Pineapple juice, 1/2 cup  Minced beef, 3 oz  Gravy, 2 Tbsp  Mashed potatoes, 1/2 cup  Minced fresh broccoli, 1/2 cup  Applesauce, 1/2 cup  Coffee, 1 cup  [place], barley soup, 3/4 cup  Minced Hawaiian chicken, 3 oz  Mashed potatoes, 1/2 cup  Cooked spinach, 1/2 cup  Frozen yogurt, 1/2 cup  Non-dairy creamer, 2 Tbsp      LEVEL 3 = CHOPPED DIET  -After all the foods in level 2 (soft diet) are passing through well you should advance up to more chopped foods.  -It is still important to cut these foods into small pieces and eat slowly.  Hot Foods Cold Foods  Poultry Cottage cheese  Chopped Swedish meatballs Yogurt  Meat salads (ground or flaked meat) Milk  Flaked fish (tuna) Milkshakes  Poached or scrambled eggs Soft, cold, dry cereal  Souffles and omelets Fruit juices or nectars  Cooked cereals Chopped canned fruit  Chopped [place] toast or pancakes Canned fruit cocktail  Noodles or pasta (no rice) Pudding, mousse, custard  Cooked vegetables (no frozen peas, corn, or mixed vegetables) Green salad  Canned small sweet peas Ice cream  Creamed soup or vegetable soup Fruit ice, [place] ice  Pureed vegetable soup or alphabet soup Non-dairy creamer  Ground scalloped apples Margarine  Gravies Mayonnaise  Sauces:  Cheese, creamed, barbecue, tomato, white Ketchup  Coffee or tea Mustard    SAMPLE MENU:  LEVEL 3 Breakfast Lunch Dinner   Orange juice, 1/2 cup  Oatmeal, 1/2 cup  Scrambled eggs with cheese, 1/2 cup  Decaffeinated tea, 1 cup  Whole milk, 1 cup  Non-dairy creamer, 2 Tbsp  Ketchup, 1 Tbsp  Margarine, 1 tsp  Salt, 1/4 tsp  Sugar, 2 tsp  Pineapple juice, 1/2 cup  Ground beef, 3 oz  Gravy, 2 Tbsp  Mashed potatoes, 1/2 cup  Cooked spinach, 1/2 cup  Applesauce, 1/2 cup  Decaffeinated coffee  Whole milk  Non-dairy creamer, 2 Tbsp  Margarine, 1 tsp  Salt, 1/4 tsp  Pureed [place], barley soup, 3/4 cup  Barbecue chicken, 3 oz  Mashed potatoes, 1/2 cup  Ground fresh broccoli, 1/2 cup  Frozen yogurt, 1/2 cup  Decaffeinated tea, 1 cup  Non-dairy creamer, 2 Tbsp  Margarine, 1 tsp  Salt, 1/4 tsp  Sugar, 1 tsp    LEVEL 4:  REGULAR FOODS  -Foods in this group are soft, moist, regularly textured foods.   -This level includes meat and breads, which tend to be the hardest things to swallow.   -Eat very slowly, chew well and continue to avoid carbonated drinks. -most people are at this level in 4-6 weeks  Hot Foods Cold Foods  Baked fish or skinned Soft cheeses - cottage cheese  Souffles and omelets Cream cheese  Eggs Yogurt  Stuffed shells Milk  Spaghetti with meat sauce Milkshakes  Cooked cereal Cold dry cereals (no nuts, dried fruit, coconut)  [place] toast or pancakes Crackers  Buttered toast Fruit juices or nectars  Noodles or pasta (no rice) Canned fruit  Potatoes (all types) Ripe bananas  Soft, cooked vegetables (no corn, lima, or baked beans) Peeled, ripe, fresh fruit  Creamed soups or vegetable soup Cakes (no nuts, dried fruit, coconut)  Canned chicken noodle soup Plain doughnuts  Gravies Ice cream  Bacon dressing Pudding, mousse, custard  Sauces:  Cheese, creamed, barbecue, tomato, white Fruit ice, [place] ice, sherbet  Decaffeinated tea or coffee Whipped gelatin  Pork chops Regular gelatin   Canned fruited  gelatin molds   Sugar, syrup, honey, jam, jelly   Cream   Non-dairy   Margarine   Oil   Mayonnaise   Ketchup   Mustard   TROUBLESHOOTING IRREGULAR BOWELS  1) Avoid extremes of bowel  movements (no bad constipation/diarrhea)  °2) Miralax 17gm mixed in 8oz. water or juice-daily. May use BID as needed.  °3) Gas-x,Phazyme, etc. as needed for gas & bloating.  °4) Soft,bland diet. No spicy,greasy,fried foods.  °5) Prilosec over-the-counter as needed  °6) May hold gluten/wheat products from diet to see if symptoms improve.  °7) May try probiotics (Align, Activa, etc) to help calm the bowels down  °7) If symptoms become worse call back immediately. ° ° ° °If you have any questions please call our office at [facility]: [phone]. ° °LAPAROSCOPIC SURGERY: POST OP INSTRUCTIONS ° °###################################################################### ° °EAT °Gradually transition to a high fiber diet with a fiber supplement over the next few weeks after discharge.  Start with a pureed / full liquid diet (see below) ° °WALK °Walk an hour a day.  Control your pain to do that.   ° °CONTROL PAIN °Control pain so that you can walk, sleep, tolerate sneezing/coughing, go up/down stairs. ° °HAVE A BOWEL MOVEMENT DAILY °Keep your bowels regular to avoid problems.  OK to try a laxative to override constipation.  OK to use an antidairrheal to slow down diarrhea.  Call if not better after 2 tries ° °CALL IF YOU HAVE PROBLEMS/CONCERNS °Call if you are still struggling despite following these instructions. °Call if you have concerns not answered by these instructions ° °###################################################################### ° ° ° °1. DIET: Follow a light bland diet the first 24 hours after arrival home, such as soup, liquids, crackers, etc.  Be sure to include lots of fluids daily.  Avoid fast food or heavy meals as your are more likely to get nauseated.  Eat a low fat the next few days after  surgery.   °2. Take your usually prescribed home medications unless otherwise directed. °3. PAIN CONTROL: °a. Pain is best controlled by a usual combination of three different methods TOGETHER: °i. Ice/Heat °ii. Over the counter pain medication °iii. Prescription pain medication °b. Most patients will experience some swelling and bruising around the incisions.  Ice packs or heating pads (30-60 minutes up to 6 times a day) will help. Use ice for the first few days to help decrease swelling and bruising, then switch to heat to help relax tight/sore spots and speed recovery.  Some people prefer to use ice alone, heat alone, alternating between ice & heat.  Experiment to what works for you.  Swelling and bruising can take several weeks to resolve.   °c. It is helpful to take an over-the-counter pain medication regularly for the first few weeks.  Choose one of the following that works best for you: °i. Naproxen (Aleve, etc)  Two 220mg tabs twice a day °ii. Ibuprofen (Advil, etc) Three 200mg tabs four times a day (every meal & bedtime) °iii. Acetaminophen (Tylenol, etc) 500-650mg four times a day (every meal & bedtime) °d. A  prescription for pain medication (such as oxycodone, hydrocodone, etc) should be given to you upon discharge.  Take your pain medication as prescribed.  °i. If you are having problems/concerns with the prescription medicine (does not control pain, nausea, vomiting, rash, itching, etc), please call us [phone] to see if we need to switch you to a different pain medicine that will work better for you and/or control your side effect better. °ii. If you need a refill on your pain medication, please contact your pharmacy.  They will contact our office to request authorization. Prescriptions will not be filled after 5 pm or on week-ends. °4. Avoid getting   constipated.  Between the surgery and the pain medications, it is common to experience some constipation.  Increasing fluid intake and taking a  fiber supplement (such as Metamucil, Citrucel, FiberCon, MiraLax, etc) 1-2 times a day regularly will usually help prevent this problem from occurring.  A mild laxative (prune juice, Milk of Magnesia, MiraLax, etc) should be taken according to package directions if there are no bowel movements after 48 hours.   °5. Watch out for diarrhea.  If you have many loose bowel movements, simplify your diet to bland foods & liquids for a few days.  Stop any stool softeners and decrease your fiber supplement.  Switching to mild anti-diarrheal medications (Kayopectate, Pepto Bismol) can help.  If this worsens or does not improve, please call us. °6. Wash / shower every day.  You may shower over the dressings as they are waterproof.  Continue to shower over incision(s) after the dressing is off. °7. Remove your waterproof bandages 5 days after surgery.  You may leave the incision open to air.  You may replace a dressing/Band-Aid to cover the incision for comfort if you wish.  °8. ACTIVITIES as tolerated:   °a. You may resume regular (light) daily activities beginning the next day--such as daily self-care, walking, climbing stairs--gradually increasing activities as tolerated.  If you can walk 30 minutes without difficulty, it is safe to try more intense activity such as jogging, treadmill, bicycling, low-impact aerobics, swimming, etc. °b. Save the most intensive and strenuous activity for last such as sit-ups, heavy lifting, contact sports, etc  Refrain from any heavy lifting or straining until you are off narcotics for pain control.   °c. DO NOT PUSH THROUGH PAIN.  Let pain be your guide: If it hurts to do something, don't do it.  Pain is your body warning you to avoid that activity for another week until the pain goes down. °d. You may drive when you are no longer taking prescription pain medication, you can comfortably wear a seatbelt, and you can safely maneuver your car and apply brakes. °e. You may have sexual intercourse  when it is comfortable.  °9. FOLLOW UP in our office °a. Please call [facility] at [phone] to set up an appointment to see your surgeon in the office for a follow-up appointment approximately 2-3 weeks after your surgery. °b. Make sure that you call for this appointment the day you arrive home to insure a convenient appointment time. °10. IF YOU HAVE DISABILITY OR FAMILY LEAVE FORMS, BRING THEM TO THE OFFICE FOR PROCESSING.  DO NOT GIVE THEM TO YOUR DOCTOR. ° ° °WHEN TO CALL US [phone]: °1. Poor pain control °2. Reactions / problems with new medications (rash/itching, nausea, etc)  °3. Fever over 101.5 F (38.5 C) °4. Inability to urinate °5. Nausea and/or vomiting °6. Worsening swelling or bruising °7. Continued bleeding from incision. °8. Increased pain, redness, or drainage from the incision ° ° The clinic staff is available to answer your questions during regular business hours (8:30am-5pm).  Please don’t hesitate to call and ask to speak to one of our nurses for clinical concerns.  ° If you have a medical emergency, go to the nearest emergency room or call 911. ° A surgeon from [facility] is always on call at the hospitals ° ° °[facility] °[address], [address], [place], [place]  [address] ? °MAIN: [phone] ? TOLL FREE: [phone] ?  °FAX [phone] °[email or web address] ° ° °

## 2016-03-04 NOTE — Op Note (Signed)
03/04/2016  11:14 AM  PATIENT:  [name]  61 y.o. female  Patient Care Team: Betty G Martinique, MD as PCP - General (Family Medicine) Clarene Reamer, MD as Consulting Physician (Psychiatry) Michael Boston, MD as Consulting Physician (General Surgery) Doran Stabler, MD as Consulting Physician (Gastroenterology)  PRE-OPERATIVE DIAGNOSIS:  Hiatal hernia  POST-OPERATIVE DIAGNOSIS:  Hiatal hernia  PROCEDURE:  :  1. XI ROBOTIC ASSISTED reduction of paraesophageal hiatal hernia 2. Type II mediastinal dissection. 3. Primary repair of hiatal hernia over pledgets. 4. Anterior & posterior gastropexy. 5. Nissen fundoplication 2 cm over a 56-French bougie  SURGEON:  [name], MD  ASSIST:   Stark Klein, MD Edward Qualia, PA-S, [facility]   ANESTHESIA:   local and general  EBL:  Total I/O In: 2000 [I.V.:2000] Out: 25 [Urine:675]  Delay start of Pharmacological VTE agent (>24hrs) due to surgical blood loss or risk of bleeding:  no  ANESTHESIA: 1. General anesthesia. 2. Local anesthetic in a field block around all port sites.  SPECIMEN:  Mediastinal hernia sac (not sent).  DRAINS:  No drain  COUNTS:  YES  PLAN OF CARE: Admit for overnight observation  PATIENT DISPOSITION:  PACU - hemodynamically stable.  INDICATION:   Patient with symptomatic paraesophageal hiatal hernia.  The patient has had extensive work-up & we feel the patient will benefit from repair:  The anatomy & physiology of the foregut and anti-reflux mechanism was discussed.  The pathophysiology of hiatal herniation and GERD was discussed.  Natural history risks without surgery was discussed.   The patient's symptoms are not adequately controlled by medicines and other non-operative treatments.  I feel the risks of no intervention will lead to serious problems that outweigh the operative risks; therefore, I recommended surgery to reduce the hiatal hernia out of the chest and fundoplication to  rebuild the anti-reflux valve and control reflux better.  Need for a thorough workup to rule out the differential diagnosis and plan treatment was explained.  I explained laparoscopic techniques with possible need for an open approach.  Risks such as bleeding, infection, abscess, leak, need for further treatment, heart attack, death, and other risks were discussed.   I noted a good likelihood this will help address the problem.  Goals of post-operative recovery were discussed as well.  Possibility that this will not correct all symptoms was explained.  Post-operative dysphagia, need for short-term liquid & pureed diet, inability to vomit, possibility of reherniation, possible need for medicines to help control symptoms in addition to surgery were discussed.  We will work to minimize complications.   Educational handouts further explaining the pathology, treatment options, and dysphagia diet was given as well.  Questions were answered.  The patient expresses understanding & wishes to proceed with surgery.  OR FINDINGS:   Small sliding paraesophageal hiatal hernia with 5% of the stomach in the mediastinum.  There was a 6 x 6 cm hiatal defect.  It is a primary repair over pledgets.   The patient has a 2 cm Nissen fundoplication that was done over 56-French bougie.  The patient has had anterior and posterior gastropexy.  DESCRIPTION:   Informed consent was confirmed.  The patient received IV antibiotics prior to incision.  The underwent general anesthesia without difficulty.  A Foley catheter sterilely placed.  The patient was positioned in split leg with arms tucked. The abdomen was prepped and draped in the sterile fashion.  Surgical time-out confirmed our plan.  I placed a  8 mm robotic port in the left upper quadrant paramedian region using Varess technique with the patient in steep reverse Trendelenburg and left side up.  Entry was clean.  We induced carbon dioxide insufflation.  Camera inspection  revealed no injury.  Under direct visualization, I placed extra robotic ports.  I also placed a 5 mm port in the left subxiphoid region under direct visualization.  I removed that and placed an Omega-shaped rigid Nathanson liver retractor to lift the left lateral sector of the liver anteriorly to expose the esophageal hiatus.  This was secured to the bed using the iron man system.  I placed another 36mm assist port in the right abdomen.  We grasped the anterior mediastinal sac at the apex of the crus.  I scored through that and got into the anterior mediastinum.  I was able to free the mediastinal sac from its attachments to the pericardium and bilateral pleura using primarily focused gentle blunt dissection as well as focused ultrasonic Harmonic dissection.  I transected phrenoesophageal attachments to the inner right crus, preserving a two centimeter cuff of mediastinal sac until I found the base of the crura.  I then came around anteriorly on the left side and freed up the phrenoesophageal attachments of the mediastinal sac on the medial part of the left crus on the superior half.  I did careful mediastinal dissection to free the mediastinal sac.  With that, we could relieve the suction cup affect of the hernia sac and help reduce the stomach back down into the abdomen, flipped back approriately.    We ligated the short gastrics along the lesser curvature of the stomach about a third way down and then came up proximally over the fundus.  We released the attachments of the stomach to the retroperitoneum until we were able to connect with the prior dissection on the left crus.  We completed the release of phrenoesophageal attachments to the medial part of the left crus down to its base.  With this, we had circumferential mobilization.    We placed the stomach and esophagus on axial tension.  I then did a Type II mediastinal dissection where I freed the esophagus from its attachments to the aorta, spine,  pleura, and pericardium using primarily gentle blunt as well as focused ultrasonic dissection.  We saw the anterior & posterior vagus nerves intact.  We preserved it at all times.  I procedded to dissect about 20 cm proximally into the mediastinum.  With that I could straighten out the esophagus and get 4 cm of intra-abdominal length of the esophagus at a best estimation.  I freed the anterior mediastinal sac off the esophagus & stomach.  We saw the anterior vagus nerve and freed the sac off of the vagus.  I dissected out & removed the fatty  epiphrenic pads at the esophagogastric junction. With that, I could better define the esophagogastric junction.  I confirmed the the patient had 4 cm of intra-abdominal esophageal length off tension.  I removed redundant mediastinal hernia sac and epiphrenic pads with careful skeletonization and hemostatic transection, taking care to spare the vagus nerves.  This better delineated the cardia and esophagogastric junction  I brought the fundus of the stomach posterior to the esophagus over to the right side.  The wrap was mobile with the classic shoe shine maneuver.  Wrap became together gently.  We reflected the stomach left laterally and closed the esophageal hiatus using 0 Ethibond stitch using horizontal mattress stitches with pledgets  on both sides.  I did that x3 stitches Using robotic intracorporeal suturing.  The crura had good substance and they came together well without any tension.  I reset up the and Nissen wrap and did a posterior gastropexy by taking of #1 Ethibond stitches to the posterior part of the right side of the wrap and thru the crural closure and tied that down for good posterior gastropexy up against the hiatal closure.  I did that 3 sutures.  I then did a left anterior gastropexy taking the apex of the left side of the wrap and tacked it to the left anterior crura just posterior to the phrenic vein. I tied that down.  This helped the wrap for  completion.    Next, Anesthesia passed a lighted 56-French bougie transorally.  It was a lighted bougie and we did do this under axial tension.  It passed down easily without resistance.    I then did a classic 2 cm Nissen fundoplication on the true esophagus above the cardia using Ethibond stitch in the left side of the wrap, then anterior esophagus, and then right side of the wrap and tied that down. Did 3 stitches.  I measured it and it was 2 cm in length.  We removed the bougie.  It was intact.    The wrap was soft and floppy.  I did irrigation and ensured hemostasis.  I saw no evidence of any leak or perforation or other abnormality.  I removed the [facility] liver retractor under direct visualization.  I evacuated carbon dioxide and removed the ports.  The skin was closed with Monocryl and sterile dressings applied.  The patient is being extubated and brought back to the recovery room.  I discussed postop care in detail with the patient and family in in the office.  Discussed again with the patient in the holding area.  I am about to discuss it with the family as well.  [name], M.D., F.A.C.S. Gastrointestinal and Minimally Invasive Surgery [facility] [address], [place], [place] [id number] [phone] Main / Paging

## 2016-03-04 NOTE — H&P (Signed)
[name] 12/29/2015 4:00 PM Location: [facility] Patient #: [id number] DOB: [birth year] Single / Language: [name] / Race: White Female  Patient Care Team: Betty G Martinique, MD as PCP - General (Family Medicine) Clarene Reamer, MD as Consulting Physician (Psychiatry) Michael Boston, MD as Consulting Physician (General Surgery) Doran Stabler, MD as Consulting Physician (Gastroenterology)   History of Present Illness  The patient is a 61 year old female who presents with a hiatal hernia. Note for "Hiatal hernia": Patient sent for surgical consultation by her gastroenterologist, Dr. Loletha Carrow with [facility] gastroenterology. Concern for hiatal hernia with worsening heartburn/reflux.  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 Has struggled with heartburn and reflux for many decades. Was on proton pump inhibitors for quite some time. Got to cautiously so try to ride it out with over-the-counter medications such as Rolaids. However worsened. He followed up with gastroenterology. Restarted on proton pump inhibitors. She's been on them for years. Had some partial help with doubt getting to the point where she gets severe heartburn and reflux and regurgitation. Pillow. She gets regurgitation bending over. Often gets some fullness and bloating. Started to get a little more constipated. Moving her bowels about every other day now was supposed to daily. She does have some sleep apnea control was CPAP. She required an open cholecystectomy when she was 61 years old. Sounds like she had gallstone pancreatitis. Still gets some soreness lateral to her RUQ incision from time to time. She has poor knees so needs a cart or walker for support sometimes. She can walk at least 30 minutes without difficulty with that. She did have endometrial ablation for some endometriosis many years ago. No problems since then.  No personal nor family history of GI/colon cancer, inflammatory bowel  disease, irritable bowel syndrome, allergy such as Celiac Sprue, dietary/dairy problems, colitis, ulcers nor gastritis. No recent sick contacts/gastroenteritis. No travel outside the country. No changes in diet. No dysphagia to solids or liquids. No hematochezia, hematemesis, coffee ground emesis.  GES OK - no new events.  Ready for surgery now  Other Problems [name], [name]; 12/29/2015 4:02 PM) Anxiety Disorder  Arthritis  Asthma  Back Pain  Bladder Problems  Cancer  Gastroesophageal Reflux Disease  General anesthesia - complications  Hemorrhoids  High blood pressure  Lump In Breast  Migraine Headache  Pancreatitis  Sleep Apnea  Thyroid Disease   Past Surgical History [name], [place]; 12/29/2015 4:02 PM) Anal Fissure Repair  Breast Biopsy  Right. multiple Foot Surgery  Right. Gallbladder Surgery - Open  Knee Surgery  Bilateral. Oral Surgery  Tonsillectomy   Diagnostic Studies History [name], [name]; 12/29/2015 4:02 PM) Colonoscopy  1-5 years ago Mammogram  1-3 years ago Pap Smear  1-5 years ago  Allergies [name], [place]; 12/29/2015 4:02 PM) No Known Drug Allergies 12/29/2015  Medication History ([name], [name]; 12/29/2015 4:04 PM) Ziprasidone HCl (20MG Capsule, Oral) Active. Sertraline HCl (100MG Tablet, Oral) Active. Qvar (80MCG/ACT Aerosol Soln, Inhalation) Active. Ventolin HFA (108 (90 Base)MCG/ACT Aerosol Soln, Inhalation) Active. Omeprazole (40MG Capsule DR, Oral) Active. Montelukast Sodium (10MG Tablet, Oral) Active. Gabapentin (300MG Capsule, Oral) Active. Levothyroxine Sodium (50MCG Tablet, Oral) Active. Amitriptyline HCl (25MG Tablet, Oral) Active. Fluticasone Propionate (50MCG/ACT Suspension, Nasal as needed) Active. Medications Reconciled  Social History ([place]; 12/29/2015 4:02 PM) Alcohol use  Occasional alcohol use. Caffeine use  Carbonated beverages, Coffee, Tea. No drug use   Tobacco use  Never smoker.  Family History [name], [place]; 12/29/2015  4:02 PM) Alcohol Abuse  Family Members In General. Arthritis  Mother. Cancer  Father. Cerebrovascular Accident  Family Members In General. Colon Polyps  Sister. Hypertension  Mother. Migraine Headache  Brother. Thyroid problems  Mother.  Pregnancy / Birth History [name], [place]; 12/29/2015 4:02 PM) Age at menarche  15 years. Age of menopause  42-61 Contraceptive History  Oral contraceptives. Gravida  0 Irregular periods     Review of Systems ([name]; 12/29/2015 4:02 PM) General Present- Weight Gain. Not Present- Appetite Loss, Chills, Fatigue, Fever, Night Sweats and Weight Loss. Skin Present- Dryness. Not Present- Change in Wart/Mole, Hives, Jaundice, New Lesions, Non-Healing Wounds, Rash and Ulcer. HEENT Present- Ringing in the Ears, Seasonal Allergies, Sinus Pain and Wears glasses/contact lenses. Not Present- Earache, Hearing Loss, Hoarseness, Nose Bleed, Oral Ulcers, Sore Throat, Visual Disturbances and Yellow Eyes. Breast Not Present- Breast Mass, Breast Pain, Nipple Discharge and Skin Changes. Cardiovascular Not Present- Chest Pain, Difficulty Breathing Lying Down, Leg Cramps, Palpitations, Rapid Heart Rate, Shortness of Breath and Swelling of Extremities. Gastrointestinal Present- Indigestion and Nausea. Not Present- Abdominal Pain, Bloating, Bloody Stool, Change in Bowel Habits, Chronic diarrhea, Constipation, Difficulty Swallowing, Excessive gas, Gets full quickly at meals, Hemorrhoids, Rectal Pain and Vomiting. Female Genitourinary Present- Frequency, Nocturia and Urgency. Not Present- Painful Urination and Pelvic Pain. Musculoskeletal Present- Joint Pain and Joint Stiffness. Not Present- Back Pain, Muscle Pain, Muscle Weakness and Swelling of Extremities. Psychiatric Present- Anxiety and Bipolar. Not Present- Change in Sleep Pattern, Depression, Fearful and Frequent  crying. Hematology Not Present- Blood Thinners, Easy Bruising, Excessive bleeding, Gland problems, HIV and Persistent Infections.  Vitals ([name] [name]; 12/29/2015 4:02 PM) 12/29/2015 4:02 PM Weight: 168 lb Height: 64in Body Surface Area: 1.82 m Body Mass Index: 28.84 kg/m  Temp.: 50F(Temporal)  Pulse: 72 (Regular)  BP: 132/74 (Sitting, Left Arm, Standard)   BP (!) 152/92   Pulse 96   Temp 98 F (36.7 C) (Oral)   Resp 18   Ht 5' 0.5" (1.537 m)   Wt 74.8 kg (165 lb)   SpO2 97%   BMI 31.69 kg/m      Physical Exam [name] MD; 12/29/2015 5:09 PM) General Mental Status-Alert. General Appearance-Not in acute distress, Not Sickly. Orientation-Oriented X3. Hydration-Well hydrated. Voice-Normal.  Integumentary Global Assessment Upon inspection and palpation of skin surfaces of the - Axillae: non-tender, no inflammation or ulceration, no drainage. and Distribution of scalp and body hair is normal. General Characteristics Temperature - normal warmth is noted.  Head and Neck Head-normocephalic, atraumatic with no lesions or palpable masses. Face Global Assessment - atraumatic, no absence of expression. Neck Global Assessment - no abnormal movements, no bruit auscultated on the right, no bruit auscultated on the left, no decreased range of motion, non-tender. Trachea-midline. Thyroid Gland Characteristics - non-tender.  Eye Eyeball - Left-Extraocular movements intact, No Nystagmus. Eyeball - Right-Extraocular movements intact, No Nystagmus. Cornea - Left-No Hazy. Cornea - Right-No Hazy. Sclera/Conjunctiva - Left-No scleral icterus, No Discharge. Sclera/Conjunctiva - Right-No scleral icterus, No Discharge. Pupil - Left-Direct reaction to light normal. Pupil - Right-Direct reaction to light normal. Note: Wears glasses. Vision acceptable   ENMT Ears Pinna - Left - no drainage observed, no generalized tenderness  observed. Right - no drainage observed, no generalized tenderness observed. Nose and Sinuses External Inspection of the Nose - no destructive lesion observed. Inspection of the nares - Left - quiet respiration. Right - quiet respiration. Mouth and Throat Lips - Upper Lip - no fissures  observed, no pallor noted. Lower Lip - no fissures observed, no pallor noted. Nasopharynx - no discharge present. Oral Cavity/Oropharynx - Tongue - no dryness observed. Oral Mucosa - no cyanosis observed. Hypopharynx - no evidence of airway distress observed.  Chest and Lung Exam Inspection Movements - Normal and Symmetrical. Accessory muscles - No use of accessory muscles in breathing. Palpation Palpation of the chest reveals - Non-tender. Auscultation Breath sounds - Normal and Clear.  Cardiovascular Auscultation Rhythm - Regular. Murmurs & Other Heart Sounds - Auscultation of the heart reveals - No Murmurs and No Systolic Clicks.  Abdomen Inspection Inspection of the abdomen reveals - No Visible peristalsis and No Abnormal pulsations. Umbilicus - No Bleeding, No Urine drainage. Palpation/Percussion Palpation and Percussion of the abdomen reveal - Soft, Non Tender, No Rebound tenderness, No Rigidity (guarding) and No Cutaneous hyperesthesia. Note: Abdomen soft. Right upper quadrant subcostal incision well-healed. Mild discomfort in right flank subcostal ridge. No step-off. No guarding. Nontender, nondistended. No guarding. No umbilical no other hernias   Female Genitourinary Sexual Maturity Tanner 5 - Adult hair pattern. Note: No vaginal bleeding nor discharge   Peripheral Vascular Upper Extremity Inspection - Left - No Cyanotic nailbeds, Not Ischemic. Right - No Cyanotic nailbeds, Not Ischemic.  Neurologic Neurologic evaluation reveals -normal attention span and ability to concentrate, able to name objects and repeat phrases. Appropriate fund of knowledge , normal sensation and normal  coordination. Mental Status Affect - not angry, not paranoid. Cranial Nerves-Normal Bilaterally. Gait-Normal.  Neuropsychiatric Mental status exam performed with findings of-able to articulate well with normal speech/language, rate, volume and coherence, thought content normal with ability to perform basic computations and apply abstract reasoning and no evidence of hallucinations, delusions, obsessions or homicidal/suicidal ideation. Note: Pleasant. Inquisitive   Musculoskeletal Global Assessment Spine, Ribs and Pelvis - no instability, subluxation or laxity. Right Upper Extremity - no instability, subluxation or laxity.  Lymphatic Head & Neck  General Head & Neck Lymphatics: Bilateral - Description - No Localized lymphadenopathy. Axillary  General Axillary Region: Bilateral - Description - No Localized lymphadenopathy. Femoral & Inguinal  Generalized Femoral & Inguinal Lymphatics: Left - Description - No Localized lymphadenopathy. Right - Description - No Localized lymphadenopathy.  Recent Results (from the past 2160 hour(s))  Type and screen All Cardiac and thoracic surgeries, spinal fusions, myomectomies, craniotomies, colon & liver resections, total joint revisions, same day c-section with placenta previa or accreta.     Status: None   Collection Time: 03/03/16  9:32 AM  Result Value Ref Range   ABO/RH(D) A POS    Antibody Screen NEG    Sample Expiration 03/07/2016    Extend sample reason NO TRANSFUSIONS OR PREGNANCY IN THE PAST 3 MONTHS   ABO/Rh     Status: None   Collection Time: 03/03/16  9:32 AM  Result Value Ref Range   ABO/RH(D) A POS   Basic metabolic panel     Status: Abnormal   Collection Time: 03/03/16 10:00 AM  Result Value Ref Range   Sodium 140 135 - 145 mmol/L   Potassium 4.4 3.5 - 5.1 mmol/L   Chloride 104 101 - 111 mmol/L   CO2 28 22 - 32 mmol/L   Glucose, Bld 109 (H) 65 - 99 mg/dL   BUN 11 6 - 20 mg/dL   Creatinine, Ser 0.92 0.44 - 1.00  mg/dL   Calcium 9.3 8.9 - 10.3 mg/dL   GFR calc non Af Amer >60 >60 mL/min   GFR calc Af Amer >60 >  60 mL/min    Comment: (NOTE) The eGFR has been calculated using the CKD EPI equation. This calculation has not been validated in all clinical situations. eGFR's persistently <60 mL/min signify possible Chronic Kidney Disease.    Anion gap 8 5 - 15  CBC     Status: Abnormal   Collection Time: 03/03/16 10:00 AM  Result Value Ref Range   WBC 7.1 4.0 - 10.5 K/uL   RBC 4.22 3.87 - 5.11 MIL/uL   Hemoglobin 11.5 (L) 12.0 - 15.0 g/dL   HCT 35.9 (L) 36.0 - 46.0 %   MCV 85.1 78.0 - 100.0 fL   MCH 27.3 26.0 - 34.0 pg   MCHC 32.0 30.0 - 36.0 g/dL   RDW 14.2 11.5 - 15.5 %   Platelets 304 150 - 400 K/uL   CLINICAL DATA:  Bloating. Heartburn. Nausea and vomiting. Early satiety.  EXAM: NUCLEAR MEDICINE GASTRIC EMPTYING SCAN  TECHNIQUE: After oral ingestion of radiolabeled meal, sequential abdominal images were obtained for 4 hours. Percentage of activity emptying the stomach was calculated at 1 hour, 2 hour, 3 hour, and 4 hours.  RADIOPHARMACEUTICALS:  2.1 mCi Tc-40msulfur colloid in standardized meal  COMPARISON:  CT of the abdomen pelvis 01/13/2006  FINDINGS: Expected location of the stomach in the left upper quadrant. Ingested meal empties the stomach gradually over the course of the study.  14.3% emptied at 1 hr ( normal >= 10%)  37.1% emptied at 2 hr ( normal >= 40%)  73.9% emptied at 3 hr ( normal >= 70%)  > 90% emptied at 4 hr ( normal >= 90%)  IMPRESSION: Normal gastric emptying study.   Electronically Signed   By: CSan MorelleM.D.   On: 01/13/2016 13:21  Assessment & Plan  PARAESOPHAGEAL HIATAL HERNIA (K44.9) Impression: Not particularly large but with worsening heartburn and reflux would recommend repair.  The anatomy & physiology of the foregut and anti-reflux mechanism was discussed. The pathophysiology of hiatal herniation and GERD was  discussed. Natural history risks without surgery was discussed. The patient's symptoms are not adequately controlled by medicines and other non-operative treatments. I feel the risks of no intervention will lead to serious problems that outweigh the operative risks; therefore, I recommended surgery to reduce the hiatal hernia out of the chest and fundoplication to rebuild the anti-reflux valve and control reflux better. Need for a thorough workup to rule out the differential diagnosis and plan treatment was explained. I explained laparoscopic techniques with possible need for an open approach.  Risks such as bleeding, infection, abscess, leak, need for further treatment, heart attack, death, and other risks were discussed. I noted a good likelihood this will help address the problem. Goals of post-operative recovery were discussed as well. Possibility that this will not correct all symptoms was explained. Post-operative dysphagia, need for short-term liquid & pureed diet, inability to vomit, possibility of reherniation, possible need for medicines to help control symptoms in addition to surgery were discussed. We will work to minimize complications. Educational handouts further explaining the pathology, treatment options, and dysphagia diet was given as well. Questions were answered. The patient expresses understanding & wishes to proceed with surgery.    GASTROESOPHAGEAL REFLUX DISEASE WITH ESOPHAGITIS (K21.0) Impression: Inadequately controlled heartburn and reflux despite maximal medical therapy and diet modification. Known hiatal hernia.  I think she would benefit from surgical repair. Minimally invasive approach. Probably 56 biological mesh buttress reinforcement.  Manometry shows good esophageal function. Can benefit from Nissen fundoplication  Current  Plans You are being scheduled for surgery - Our schedulers will call you.  You should hear from our office's scheduling department within 5  working days about the location, date, and time of surgery. We try to make accommodations for patient's preferences in scheduling surgery, but sometimes the OR schedule or the surgeon's schedule prevents [place] from making those accommodations.  If you have not heard from our office [phone]) in 5 working days, call the office and ask for your surgeon's nurse.  If you have other questions about your diagnosis, plan, or surgery, call the office and ask for your surgeon's nurse.  Follow Up - Call [facility] office after tests / studies done to discuss further plans Pt Education - [facility] Esophageal Surgery Diet HCI (Mohsen Odenthal): discussed with patient and provided information. Pt Education - [facility] Laparoscopic Surgery HCI   POSTPRANDIAL BLOATING (R14.0) Impression: May be related just to her hiatal hernia, but I would like to rule out gastroparesis.  If mild/moderate, fundoplication should help resolve the issue. If severe delayed gastric emptying, she could benefit from concurrent pyloroplasty.  [name], M.D., F.A.C.S. Gastrointestinal and Minimally Invasive Surgery [facility] [address] [place], [place] [address] [phone] Main / Paging

## 2016-03-04 NOTE — Anesthesia Procedure Notes (Signed)
Procedure Name: Intubation Date/Time: 03/04/2016 7:50 AM Performed by: Glory Buff Pre-anesthesia Checklist: Patient identified, Emergency Drugs available, Suction available and Patient being monitored Patient Re-evaluated:Patient Re-evaluated prior to inductionOxygen Delivery Method: Circle system utilized Preoxygenation: Pre-oxygenation with 100% oxygen Intubation Type: IV induction Ventilation: Mask ventilation without difficulty and Oral airway inserted - appropriate to patient size Laryngoscope Size: Glidescope and 3 Grade View: Grade I Tube type: Oral Tube size: 7.0 mm Number of attempts: 1 Airway Equipment and Method: Oral airway,  Bougie stylet and Video-laryngoscopy Placement Confirmation: ETT inserted through vocal cords under direct vision,  positive ETCO2 and breath sounds checked- equal and bilateral Secured at: 20 cm Tube secured with: Tape Dental Injury: Teeth and Oropharynx as per pre-operative assessment and Injury to tongue  Difficulty Due To: Difficulty was anticipated, Difficult Airway- due to anterior larynx, Difficult Airway- due to reduced neck mobility and Difficult Airway- due to limited oral opening Future Recommendations: Recommend- induction with short-acting agent, and alternative techniques readily available

## 2016-03-04 NOTE — Transfer of Care (Signed)
Immediate Anesthesia Transfer of Care Note  Patient: [name]  Procedure(s) Performed: Procedure(s): XI ROBOTIC ASSISTED LAPAROSCOPIC PARAESOPHAGEAL HIATAL HERNIA WITH FUNDOPLICATION (N/A)  Patient Location: PACU  Anesthesia Type:General  Level of Consciousness: sedated  Airway & Oxygen Therapy: Patient Spontanous Breathing and Patient connected to face mask oxygen  Post-op Assessment: Report given to RN and Post -op Vital signs reviewed and stable  Post vital signs: Reviewed and stable  Last Vitals:  Vitals:   03/04/16 0538  BP: (!) 152/92  Pulse: 96  Resp: 18  Temp: 36.7 C    Last Pain:  Vitals:   03/04/16 0602  TempSrc:   PainSc: 4       Patients Stated Pain Goal: 4 ([id number])  Complications: No apparent anesthesia complications

## 2016-03-04 NOTE — Interval H&P Note (Signed)
History and Physical Interval Note:  03/04/2016 7:23 AM  [name]  has presented today for surgery, with the diagnosis of Hiatal hernia  The various methods of treatment have been discussed with the patient and family. After consideration of risks, benefits and other options for treatment, the patient has consented to  Procedure(s): XI ROBOTIC POSSIBLE  LAPAROSCOPIC PARAESOPHAGEAL HIATAL HERNIA WITH FUNDOPLICATION (N/A) as a surgical intervention .  The patient's history has been reviewed, patient examined, no change in status, stable for surgery.  I have reviewed the patient's chart and labs.  Questions were answered to the patient's satisfaction.     [name] C.

## 2016-03-05 ENCOUNTER — Observation Stay (HOSPITAL_COMMUNITY): Payer: Medicare HMO

## 2016-03-05 ENCOUNTER — Encounter (HOSPITAL_COMMUNITY): Payer: Self-pay | Admitting: Student

## 2016-03-05 DIAGNOSIS — K449 Diaphragmatic hernia without obstruction or gangrene: Secondary | ICD-10-CM | POA: Diagnosis not present

## 2016-03-05 MED ORDER — SODIUM CHLORIDE 0.9% FLUSH
3.0000 mL | Freq: Two times a day (BID) | INTRAVENOUS | Status: DC
  Administered 2016-03-05: 3 mL via INTRAVENOUS

## 2016-03-05 MED ORDER — SODIUM CHLORIDE 0.9% FLUSH: 3.0000 mL | INTRAVENOUS | Status: DC | PRN

## 2016-03-05 MED ORDER — SODIUM CHLORIDE 0.9 % IV SOLN: 250.0000 mL | INTRAVENOUS | Status: DC | PRN

## 2016-03-05 NOTE — Evaluation (Signed)
Physical Therapy Evaluation Patient Details Name: [name] MRN: [id number] DOB: Jun 24, 1955 Today's Date: 03/05/2016   History of Present Illness  61 yo female s/p hiatal hernia robotic repair and fundoplication [id number]. Hx of bipolar d/o, obesity  Clinical Impression  On eval, pt was supervision -Mod Ind with mobility. She walked ~125 feet while holding on to IV pole. She also climbed 3 steps with use of 1 rail. Pt participated well during session. Do not anticipate any follow up PT needs at discharge. Recommend ambulation in hallways as tolerated.     Follow Up Recommendations No PT follow up;Supervision - Intermittent    Equipment Recommendations  None recommended by PT    Recommendations for Other Services       Precautions / Restrictions Precautions Precautions: None Restrictions Weight Bearing Restrictions: No      Mobility  Bed Mobility Overal bed mobility: Needs Assistance Bed Mobility: Rolling;Sidelying to Sit;Sit to Supine Rolling: Modified independent (Device/Increase time) Sidelying to sit: Supervision   Sit to supine: Supervision   General bed mobility comments: cues for logroll to limit stress on abdomen during bed mobility. Pt performed well.   Transfers Overall transfer level: Modified independent Equipment used: None Transfers: Sit to/from Stand Sit to Stand: Modified independent (Device/Increase time)            Ambulation/Gait Ambulation/Gait assistance: Supervision Ambulation Distance (Feet): 125 Feet Assistive device: None (IVpole) Gait Pattern/deviations: Step-through pattern;Decreased stride length     General Gait Details: slow gait speed. No LOB.   Stairs Stairs: Yes Stairs assistance: Min guard Stair Management: Step to pattern;Sideways;One rail Left Number of Stairs: 3 General stair comments: close guard for safety. VCs safety, technique, sequence.   Wheelchair Mobility    Modified Rankin (Stroke Patients Only)       Balance                                             Pertinent Vitals/Pain Pain Assessment: 0-10 Pain Score: 5  Pain Location: abdomen Pain Descriptors / Indicators: Sore Pain Intervention(s): Monitored during session;Repositioned    Home Living Family/patient expects to be discharged to:: Private residence Living Arrangements: Alone   Type of Home: Mobile home Home Access: Stairs to enter Entrance Stairs-Rails: Right;Left;Can reach both Entrance Stairs-Number of Steps: 4 Home Layout: One level Home Equipment: None      Prior Function Level of Independence: Independent               Hand Dominance        Extremity/Trunk Assessment   Upper Extremity Assessment Upper Extremity Assessment: Overall WFL for tasks assessed    Lower Extremity Assessment Lower Extremity Assessment: Overall WFL for tasks assessed    Cervical / Trunk Assessment Cervical / Trunk Assessment: Normal  Communication   Communication: No difficulties  Cognition Arousal/Alertness: Awake/alert Behavior During Therapy: WFL for tasks assessed/performed Overall Cognitive Status: Within Functional Limits for tasks assessed                      General Comments      Exercises     Assessment/Plan    PT Assessment Patient needs continued PT services  PT Problem List Decreased mobility;Pain          PT Treatment Interventions Gait training;Therapeutic activities;Therapeutic exercise;Patient/family education;Functional mobility training    PT Goals (Current goals can  be found in the Care Plan section)  Acute Rehab PT Goals Patient Stated Goal: home soon PT Goal Formulation: With patient Time For Goal Achievement: 03/19/16 Potential to Achieve Goals: Good    Frequency Min 3X/week   Barriers to discharge        Co-evaluation               End of Session   Activity Tolerance: Patient tolerated treatment well Patient left: in chair;with call  bell/phone within reach      Functional Assessment Tool Used: clinical judgement Functional Limitation: Mobility: Walking and moving around Mobility: Walking and Moving Around Current Status [id number]): At least 1 percent but less than 20 percent impaired, limited or restricted Mobility: Walking and Moving Around Goal Status [phone]): At least 1 percent but less than 20 percent impaired, limited or restricted    Time: 1440-1504 PT Time Calculation (min) (ACUTE ONLY): 24 min   Charges:   PT Evaluation $PT Eval Low Complexity: 1 Procedure     PT G Codes:   PT G-Codes **NOT FOR INPATIENT CLASS** Functional Assessment Tool Used: clinical judgement Functional Limitation: Mobility: Walking and moving around Mobility: Walking and Moving Around Current Status [id number]): At least 1 percent but less than 20 percent impaired, limited or restricted Mobility: Walking and Moving Around Goal Status [phone]): At least 1 percent but less than 20 percent impaired, limited or restricted    Weston Anna, MPT Pager: [phone]

## 2016-03-05 NOTE — Progress Notes (Signed)
OT Cancellation Note  Patient Details Name: [name] MRN: [id number] DOB: 05-02-1955   Cancelled Treatment:    Reason Eval/Treat Not Completed: PT screened, no needs identified, will sign off  Shavaughn Seidl 03/05/2016, 3:58 PM  Lesle Chris, OTR/L [id number] 03/05/2016

## 2016-03-05 NOTE — Progress Notes (Signed)
[facility]  [address] [place], [place] [address] Phone: [phone] FAX: [phone]   [name] [id number] 17-Nov-1955    Problem List:   Principal Problem:   Paraesophageal hiatal hernia s/p robotic repair & fundoplication 9/52/8413 Active Problems:   OSA on CPAP   Essential hypertension   Asthma   GERD   Irritable bowel syndrome   Disturbance in sleep behavior   Bilateral knee pain   Obesity   Bipolar 1 disorder, mixed, moderate ([place])   1 Day Post-Op  03/04/2016  Procedure(s): POST-OPERATIVE DIAGNOSIS:  Hiatal hernia with GERD  PROCEDURE:  :  1. XI ROBOTIC ASSISTED reduction of paraesophageal hiatal hernia 2. Type II mediastinal dissection. 3. Primary repair of hiatal hernia over pledgets. 4. Anterior & posterior gastropexy. 5. Nissen fundoplication 2 cm over a 56-French bougie  SURGEON:  [name], MD  ASSIST:   Stark Klein, MD Edward Qualia, PA-S, [facility]   Assessment  Recovering  Plan:  -Esophagram to evaluate fundoplication wrap and make sure hiatal hernias in good position.  If no obstruction/leak, advanced to period diet.  Stop IV fluids.  Bipolar and psychiatric medications  VTE prophylaxis- SCDs, etc  Mobilize as tolerated to help recovery  D/C patient from hospital when patient meets criteria (anticipate in 0-1 day(s)):  Tolerating oral intake well Ambulating well Adequate pain control without IV medications Urinating  Disposition planning in place   [name], M.D., F.A.C.S. Gastrointestinal and Minimally Invasive Surgery [facility] [address], [place], [place] [address] [phone] Main / Paging   03/05/2016  CARE TEAM:  PCP: Betty Martinique, MD  Outpatient Care Team: Patient Care Team: Betty G Martinique, MD as PCP - General (Family Medicine) Clarene Reamer, MD as Consulting Physician (Psychiatry) Michael Boston, MD as Consulting Physician (General Surgery) Doran Stabler, MD as Consulting Physician (Gastroenterology)  Inpatient Treatment Team: Treatment Team: Attending Provider: Michael Boston, MD; Occupational Therapist: Lesle Chris, OT; Technician: Raylene Everts, NT; Physical Therapist: Alphonzo Severance, PT  Subjective:  Pain mostly controlled.  Walking in hallways.  Tolerating clear liquids.  Tolerating medications being crushed  Objective:  Vital signs:  Vitals:   03/04/16 2058 03/04/16 2112 03/05/16 0129 03/05/16 0459  BP:   131/72 108/66  Pulse:   (!) 116 80  Resp:  _0 Temp:   97.7 F (36.5 C) 98.6 F (37 C)  TempSrc:   Oral Oral  SpO2: 96%  96% 93%  Weight:      Height:        Last BM Date: 03/03/16  Intake/Output   Yesterday:  01/25 0701 - 01/26 0700 In: 4259 [P.O.:1330; I.V.:2195] Out: 3825 [Urine:3825] This shift:  No intake/output data recorded.  Bowel function:  Flatus: YES  BM:  No  Drain: (No drain)   Physical Exam:  General: Pt awake/alert/oriented x4 in No acute distress Eyes: PERRL, normal EOM.  Sclera clear.  No icterus Neuro: CN II-XII intact w/o focal sensory/motor deficits. Lymph: No head/neck/groin lymphadenopathy Psych:  No delerium/psychosis/paranoia HENT: Normocephalic, Mucus membranes moist.  No thrush Neck: Supple, No tracheal deviation Chest: No chest wall pain w good excursion CV:  Pulses intact.  Regular rhythm MS: Normal AROM mjr joints.  No obvious deformity Abdomen: Soft.  Nondistended.  Mildly tender at incisions only.  No evidence of peritonitis.  No incarcerated hernias. Ext:  SCDs BLE.  No mjr edema.  No cyanosis Skin: No petechiae / purpura  Results:   Labs: Results for orders placed or performed during the hospital encounter of 03/03/16 (from the past 48 hour(s))  Type and screen All Cardiac and thoracic surgeries, spinal fusions, myomectomies, craniotomies, colon & liver resections, total joint  revisions, same day c-section with placenta previa or accreta.     Status: None   Collection Time: 03/03/16  9:32 AM  Result Value Ref Range   ABO/RH(D) A POS    Antibody Screen NEG    Sample Expiration 03/07/2016    Extend sample reason NO TRANSFUSIONS OR PREGNANCY IN THE PAST 3 MONTHS   ABO/Rh     Status: None   Collection Time: 03/03/16  9:32 AM  Result Value Ref Range   ABO/RH(D) A POS   Basic metabolic panel     Status: Abnormal   Collection Time: 03/03/16 10:00 AM  Result Value Ref Range   Sodium 140 135 - 145 mmol/L   Potassium 4.4 3.5 - 5.1 mmol/L   Chloride 104 101 - 111 mmol/L   CO2 28 22 - 32 mmol/L   Glucose, Bld 109 (H) 65 - 99 mg/dL   BUN 11 6 - 20 mg/dL   Creatinine, Ser 0.92 0.44 - 1.00 mg/dL   Calcium 9.3 8.9 - 10.3 mg/dL   GFR calc non Af Amer >60 >60 mL/min   GFR calc Af Amer >60 >60 mL/min    Comment: (NOTE) The eGFR has been calculated using the CKD EPI equation. This calculation has not been validated in all clinical situations. eGFR's persistently <60 mL/min signify possible Chronic Kidney Disease.    Anion gap 8 5 - 15  CBC     Status: Abnormal   Collection Time: 03/03/16 10:00 AM  Result Value Ref Range   WBC 7.1 4.0 - 10.5 K/uL   RBC 4.22 3.87 - 5.11 MIL/uL   Hemoglobin 11.5 (L) 12.0 - 15.0 g/dL   HCT 35.9 (L) 36.0 - 46.0 %   MCV 85.1 78.0 - 100.0 fL   MCH 27.3 26.0 - 34.0 pg   MCHC 32.0 30.0 - 36.0 g/dL   RDW 14.2 11.5 - 15.5 %   Platelets 304 150 - 400 K/uL    Imaging / Studies: No results found.  Medications / Allergies: per chart  Antibiotics: Anti-infectives    Start     Dose/Rate Route Frequency Ordered Stop   03/04/16 0537  cefoTEtan in Dextrose 5% (CEFOTAN) IVPB 2 g     2 g Intravenous On call to O.R. 03/04/16 4128 03/04/16 7867        Note: Portions of this report may have been transcribed using voice recognition software. Every effort was made to ensure accuracy; however, inadvertent computerized transcription errors may  be present.   Any transcriptional errors that result from this process are unintentional.     [name], M.D., F.A.C.S. Gastrointestinal and Minimally Invasive Surgery [facility] [address] [place], [place] [address] [phone] Main / Paging   03/05/2016

## 2016-03-05 NOTE — Progress Notes (Signed)
Nutrition Education Note  RD consulted for nutrition education regarding following a pureed diet.   RD provided "The Pureed Diet" handout from the Academy of [employer]etics and provided a handout on diet therapy after Nissen Fendoplication.  RD discussed what foods/liquids are consistent with this diet and reinforced key concepts such as no carbonation, no caffeine, no straws, or sugar containing beverages. RD also stressed low fiber foods and techniques for preparing pureed foods. Provided methods to prevent dehydration and promote protein intake.  Teach back method used.  Expect good compliance.   Body mass index is 31.69 kg/m. Pt meets criteria for obese based on current BMI.  Current diet order is clear liquid, patient is consuming approximately 100% at this time.   Labs and medications reviewed.    Koleen Distance, RD, LDN Pager #- [phone]

## 2016-03-08 NOTE — Discharge Summary (Signed)
Physician Discharge Summary  Patient ID: [name] MRN: [id number] DOB/AGE: [birth year]  61 y.o.  Admit date: 03/04/2016 Discharge date: 03/05/2016  Patient Care Team: Betty G Martinique, MD as PCP - General (Family Medicine) Clarene Reamer, MD as Consulting Physician (Psychiatry) Michael Boston, MD as Consulting Physician (General Surgery) Nelida Meuse III, MD as Consulting Physician (Gastroenterology)  Discharge Diagnoses:  Principal Problem:   Paraesophageal hiatal hernia s/p robotic repair & fundoplication 0000000 Active Problems:   OSA on CPAP   Essential hypertension   Asthma   GERD   Irritable bowel syndrome   Disturbance in sleep behavior   Bilateral knee pain   Obesity   Bipolar 1 disorder, mixed, moderate ([place])   POST-OPERATIVE DIAGNOSIS:  Hiatal hernia  PROCEDURE:  :  1. XI ROBOTIC ASSISTED reduction of paraesophageal hiatal hernia 2. Type II mediastinal dissection. 3. Primary repair of hiatal hernia over pledgets. 4. Anterior & posterior gastropexy. 5. Nissen fundoplication 2 cm over a 56-French bougie  SURGEON:  [name], MD  INDICATION:   Patient with symptomatic paraesophageal hiatal hernia.  The patient has had extensive work-up & we feel the patient will benefit from repair:  The anatomy & physiology of the foregut and anti-reflux mechanism was discussed.  The pathophysiology of hiatal herniation and GERD was discussed.  Natural history risks without surgery was discussed.   The patient's symptoms are not adequately controlled by medicines and other non-operative treatments.  I feel the risks of no intervention will lead to serious problems that outweigh the operative risks; therefore, I recommended surgery to reduce the hiatal hernia out of the chest and fundoplication to rebuild the anti-reflux valve and control reflux better.  Need for a thorough workup to rule out the differential diagnosis and plan treatment was explained.  I explained  laparoscopic techniques with possible need for an open approach.  Risks such as bleeding, infection, abscess, leak, need for further treatment, heart attack, death, and other risks were discussed.   I noted a good likelihood this will help address the problem.  Goals of post-operative recovery were discussed as well.  Possibility that this will not correct all symptoms was explained.  Post-operative dysphagia, need for short-term liquid & pureed diet, inability to vomit, possibility of reherniation, possible need for medicines to help control symptoms in addition to surgery were discussed.  We will work to minimize complications.   Educational handouts further explaining the pathology, treatment options, and dysphagia diet was given as well.  Questions were answered.  The patient expresses understanding & wishes to proceed with surgery.  OR FINDINGS:   Small sliding paraesophageal hiatal hernia with 5% of the stomach in the mediastinum.  There was a 6 x 6 cm hiatal defect.  It is a primary repair over pledgets.   The patient has a 2 cm Nissen fundoplication that was done over 56-French bougie.  The patient has had anterior and posterior gastropexy.  Consults: None  Hospital Course:   The patient underwent the surgery above.  Postoperatively, the patient gradually mobilized and advanced to a solid diet.  Pain and other symptoms were treated aggressively.    By the time of discharge, the patient was walking well the hallways, eating food, having flatus.  Pain was well-controlled on an oral medications.  Based on meeting discharge criteria and continuing to recover, I felt it was safe for the patient to be discharged from the hospital to further recover with close followup. Postoperative recommendations were discussed  in detail.  They are written as well.  Discharged Condition: good  Disposition:  Follow-up Information    Brealyn Baril C., MD. Schedule an appointment as soon as possible for  a visit in 3 weeks.   Specialty:  General Surgery Why:  To follow up after your operation, To follow up after your hospital stay Contact information: [place] [address] [phone]           01-Home or Self Care  Discharge Instructions    Call MD for:    Complete by:  As directed    Temperature > 101.63F   Call MD for:  extreme fatigue    Complete by:  As directed    Call MD for:  hives    Complete by:  As directed    Call MD for:  persistant nausea and vomiting    Complete by:  As directed    Call MD for:  redness, tenderness, or signs of infection (pain, swelling, redness, odor or green/yellow discharge around incision site)    Complete by:  As directed    Call MD for:  severe uncontrolled pain    Complete by:  As directed    Diet general    Complete by:  As directed    SEE ESOPHAGEAL SURGERY DIET INSTRUCTIONS Expect some sticking with swallowing over the next 1-2 months.   This is due to swelling around your esophagus at the wrap & hiatal diaphragm repair.  It will gradually ease off over the next few months.   To help you through this temporary phase, we start you out on a pureed (blenderized) diet.   Discharge instructions    Complete by:  As directed    Please see discharge instruction sheets.   Also refer to any handouts/printouts that may have been given from the [facility] surgery office (if you visited [place] there before surgery) Please call our office if you have any questions or concerns [phone]   Driving Restrictions    Complete by:  As directed    No driving until off narcotics and can safely swerve away without pain during an emergency   Increase activity slowly    Complete by:  As directed    Lifting restrictions    Complete by:  As directed    Avoid heavy lifting initially, <20 pounds at first.   Do not push through pain.   You have no specific weight limit: If it hurts to do, DON'T DO IT.    If you feel no pain, you are  not injuring anything.  Pain will protect you from injury.   Coughing and sneezing are far more stressful to your incision than any lifting.   Avoid resuming heavy lifting (>50 pounds) or other intense activity until off all narcotic pain medications.   When want to exercise more, give yourself 2 weeks to gradually get back to full intense exercise/activity.   May shower / Bathe    Complete by:  As directed    [place].  It is fine for dressings or wounds to be washed/rinsed.  Use gentle soap & water.  This will help the incisions and/or wounds get clean & minimize infection.   May walk up steps    Complete by:  As directed    Remove dressing in 72 hours    Complete by:  As directed    You have closed incisions: Shower and bathe over these incisions with soap and  water every day.  It is OK to wash over the dressings: they are waterproof. Remove all surgical dressings on postoperative day #3.  You do not need to replace dressings over the closed incisions unless you feel more comfortable with a Band-Aid covering it.   Please call our office [phone] if you have further questions.   Sexual Activity Restrictions    Complete by:  As directed    Sexual activity as tolerated.  Do not push through pain.  Pain will protect you from injury.   Walk with assistance    Complete by:  As directed    Walk over an hour a day.  May use a walker/cane/companion to help with balance and stamina.      Allergies as of 03/05/2016      Reactions   Amlodipine Swelling   Peripheral edema   Pseudoephedrine Other (See Comments)   I fly off the walls    Risperidone And Related Other (See Comments)   Stomach upset, insomnia, drooling, tremors, "jerks," sensitivity to touch.    Aspirin Other (See Comments)   REACTION: upset stomach   Bupivacaine Hcl Hives   Codeine Itching   Etodolac Rash   Indocin [indomethacin] Other (See Comments)   Muscle spasms   Ivp Dye [iodinated Diagnostic Agents] Other  (See Comments)   Flushed and Fever, itch all over   Pentazocine Lactate Itching   oozing   Propoxyphene N-acetaminophen Itching   Propranolol Nausea Only   Sulfonamide Derivatives Other (See Comments)   unknown   Tramadol Hcl Other (See Comments)   unknown      Medication List    TAKE these medications   AEROCHAMBER PLUS inhaler Use as instructed   albuterol 108 (90 Base) MCG/ACT inhaler Commonly known as:  PROVENTIL HFA;VENTOLIN HFA Inhale 1-2 puffs into the lungs daily as needed for wheezing or shortness of breath. What changed:  how much to take  when to take this   amitriptyline 25 MG tablet Commonly known as:  ELAVIL Take 25 mg by mouth at bedtime.   anti-nausea solution Take 30 mLs by mouth every 15 (fifteen) minutes as needed for nausea or vomiting.   azelastine 0.1 % nasal spray Commonly known as:  ASTELIN Place 2 sprays into both nostrils 2 (two) times daily. Use in each nostril as directed What changed:  when to take this  additional instructions   budesonide-formoterol 80-4.5 MCG/ACT inhaler Commonly known as:  SYMBICORT Inhale 2 puffs into the lungs 2 (two) times daily.   cetirizine 10 MG tablet Commonly known as:  ZYRTEC Take 1 tablet (10 mg total) by mouth daily.   clonazePAM 0.5 MG tablet Commonly known as:  KLONOPIN Take 0.5 mg by mouth at bedtime as needed for anxiety.   diphenhydrAMINE 25 mg capsule Commonly known as:  BENADRYL Take 25 mg by mouth at bedtime as needed for sleep.   estradiol 0.1 MG/GM vaginal cream Commonly known as:  ESTRACE Place 1 Applicatorful vaginally 3 (three) times a week.   fluticasone 50 MCG/ACT nasal spray Commonly known as:  FLONASE Place 2 sprays into both nostrils daily.   gabapentin 300 MG capsule Commonly known as:  NEURONTIN Take 300 mg by mouth 3 (three) times daily.   hydrocortisone cream 1 % Apply 1 application topically 2 (two) times daily as needed for itching.   ibuprofen 200 MG  tablet Commonly known as:  ADVIL,MOTRIN Take 200-800 mg by mouth every 6 (six) hours as needed for headache or moderate  pain. Depends on pain   levothyroxine 50 MCG tablet Commonly known as:  SYNTHROID, LEVOTHROID Take 1 tablet (50 mcg total) by mouth daily.   lithium carbonate 150 MG capsule Take 150 mg by mouth 3 (three) times daily with meals.   loperamide 2 MG tablet Commonly known as:  IMODIUM A-D Take 2 mg by mouth 4 (four) times daily as needed for diarrhea or loose stools.   montelukast 10 MG tablet Commonly known as:  SINGULAIR Take 1 tablet (10 mg total) by mouth at bedtime.   naproxen sodium 220 MG tablet Commonly known as:  ANAPROX Take 4 tablets (880 mg total) by mouth 2 (two) times daily as needed (headache).   omeprazole 40 MG capsule Commonly known as:  PRILOSEC Take 1 capsule (40 mg total) by mouth daily.   ondansetron 4 MG tablet Commonly known as:  ZOFRAN Take 1 tablet (4 mg total) by mouth every 8 (eight) hours as needed for nausea.   oxybutynin 5 MG tablet Commonly known as:  DITROPAN Take 1 tablet by mouth 3 (three) times daily.   oxyCODONE 5 MG immediate release tablet Commonly known as:  Oxy IR/ROXICODONE Take 1-2 tablets (5-10 mg total) by mouth every 6 (six) hours as needed for moderate pain, severe pain or breakthrough pain.   promethazine 25 MG suppository Commonly known as:  PHENERGAN Place 1 suppository (25 mg total) rectally every 6 (six) hours as needed for nausea.   rizatriptan 10 MG disintegrating tablet Commonly known as:  MAXALT-MLT Take 1 tablet (10 mg total) by mouth daily as needed for migraine. May repeat in 2 hours if needed. Do not exceed 2 doses in 1 week period   sertraline 100 MG tablet Commonly known as:  ZOLOFT Take 2 tablets (200 mg total) by mouth daily.   ziprasidone 20 MG capsule Commonly known as:  GEODON Take 20 mg by mouth 2 (two) times daily with a meal.       Significant Diagnostic Studies:  No results  found for this or any previous visit (from the past 72 hour(s)).  Dg Esophagus W/water Raelene Bott Cm  Result Date: 03/05/2016 CLINICAL DATA:  61 year old female status post hiatal hernia repair yesterday. Postoperative evaluation. EXAM: ESOPHOGRAM/BARIUM SWALLOW TECHNIQUE: Single contrast examination was performed using  water-soluble. FLUOROSCOPY TIME:  Fluoroscopy Time:  42 seconds Radiation Exposure Index (if provided by the fluoroscopic device): 6.4 mGy COMPARISON:  No priors. FINDINGS: Water-soluble with swallow study demonstrates expected postoperative appearance near the gastroesophageal junction from recent paraesophageal hiatal hernia repair. Contrast traversed the region of the repair with no evidence of obstruction. No contrast leakage was identified during the examination. IMPRESSION: 1. Expected postoperative findings following repair of paraesophageal hiatal hernia, without evidence of obstruction or leak. Electronically Signed   By: Vinnie Langton M.D.   On: 03/05/2016 09:24    Discharge Exam: Blood pressure 138/75, pulse 79, temperature 97.6 F (36.4 C), temperature source Oral, resp. rate 17, height 5' 0.5" (1.537 m), weight 74.8 kg (165 lb), last menstrual period 03/05/2012, SpO2 97 %.  General: Pt awake/alert/oriented x4 in No acute distress Eyes: PERRL, normal EOM.  Sclera clear.  No icterus Neuro: CN II-XII intact w/o focal sensory/motor deficits. Lymph: No head/neck/groin lymphadenopathy Psych:  No delerium/psychosis/paranoia HENT: Normocephalic, Mucus membranes moist.  No thrush Neck: Supple, No tracheal deviation Chest: No chest wall pain w good excursion CV:  Pulses intact.  Regular rhythm MS: Normal AROM mjr joints.  No obvious deformity Abdomen: Soft.  Nondistended.  Nontender.  No evidence of peritonitis.  No incarcerated hernias. Ext:  SCDs BLE.  No mjr edema.  No cyanosis Skin: No petechiae / purpura  Past Medical History:  Diagnosis Date  . Allergy    seasonal   . Arthritis   . Asthma   . Bipolar 1 disorder ([place])   . 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 Complication of anesthesia   . Depression   . GERD (gastroesophageal reflux disease)   . Headache(784.0)   . History of frequent urinary tract infections   . History of hiatal hernia   . Hypertension   . Hypothyroidism   . Interstitial cystitis   . Neuromuscular disorder ([place]) 02/08/2012   TREMORS   . PONV (postoperative nausea and vomiting)   . Rosacea   . Shortness of breath   . Sleep apnea    has cpap,   . Thyroid disease    no present treatment 03-18-14    Past Surgical History:  Procedure Laterality Date  . [place] STUDY N/A 09/22/2015   Procedure: [place] STUDY;  Surgeon: Doran Stabler, MD;  Location: [facility];  Service: Gastroenterology;  Laterality: N/A;  . arthritic finger stabilizer for arthritis    . BREAST LUMPECTOMY    . BUNIONECTOMY    . CARPAL TUNNEL RELEASE Bilateral   . CHOLECYSTECTOMY    . COLONOSCOPY    . CYSTOSCOPY    . DILATION AND CURETTAGE OF UTERUS    . early stage melanoma back    . ENDOMETRIAL ABLATION    . ESOPHAGEAL MANOMETRY N/A 09/22/2015   Procedure: ESOPHAGEAL MANOMETRY (EM);  Surgeon: Doran Stabler, MD;  Location: [facility];  Service: Gastroenterology;  Laterality: N/A;  with impedence   . ingrown toenail surgery    . KNEE SURGERY     multiple knee arthroscopy  . NASAL SEPTUM SURGERY    . nerve damage and repair right thand    . rectal fissure    . squamous cell carcinoma left ear    . TMJ ARTHROPLASTY     multiple  . TONSILLECTOMY    . TRIGGER FINGER RELEASE     x2 with repairs   . ULNAR NERVE TRANSPOSITION    . UPPER GASTROINTESTINAL ENDOSCOPY  12-17-2004  . WISDOM TOOTH EXTRACTION      Social History   Social History  . Marital status: Divorced    Spouse name: N/A  . Number of children: 0  . Years of education: N/A   Occupational History  . Not on file.   Social History Main Topics  . Smoking  status: Never Smoker  . Smokeless tobacco: Never Used  . Alcohol use 0.0 oz/week     Comment: once every 3-4 months  . Drug use: No  . Sexual activity: Not Currently   Other Topics Concern  . Not on file   Social History Narrative  . No narrative on file    Family History  Problem Relation Age of Onset  . Hypertension Mother     deceased from MVA complications  . Thyroid disease Mother   . Testicular cancer Father   . Colon polyps Sister   . Coronary artery disease      No current facility-administered medications for this encounter.    Current Outpatient Prescriptions  Medication Sig Dispense Refill  . albuterol (PROVENTIL HFA;VENTOLIN HFA) 108 (90 Base) MCG/ACT inhaler Inhale 1-2 puffs into the lungs daily as  needed for wheezing or shortness of breath. (Patient taking differently: Inhale 2 puffs into the lungs every 6 (six) hours as needed for wheezing or shortness of breath. ) 54 Inhaler 3  . amitriptyline (ELAVIL) 25 MG tablet Take 25 mg by mouth at bedtime.    Marland Kitchen anti-nausea (EMETROL) solution Take 30 mLs by mouth every 15 (fifteen) minutes as needed for nausea or vomiting.    Marland Kitchen azelastine (ASTELIN) 0.1 % nasal spray Place 2 sprays into both nostrils 2 (two) times daily. Use in each nostril as directed (Patient taking differently: Place 2 sprays into both nostrils daily. Use in each nostril as directed) 30 mL 4  . budesonide-formoterol (SYMBICORT) 80-4.5 MCG/ACT inhaler Inhale 2 puffs into the lungs 2 (two) times daily. 1 Inhaler 0  . cetirizine (ZYRTEC) 10 MG tablet Take 1 tablet (10 mg total) by mouth daily. 30 tablet 5  . clonazePAM (KLONOPIN) 0.5 MG tablet Take 0.5 mg by mouth at bedtime as needed for anxiety.     . diphenhydrAMINE (BENADRYL) 25 mg capsule Take 25 mg by mouth at bedtime as needed for sleep.    Marland Kitchen estradiol (ESTRACE) 0.1 MG/GM vaginal cream Place 1 Applicatorful vaginally 3 (three) times a week. 42.5 g 3  . fluticasone (FLONASE) 50 MCG/ACT nasal spray Place  2 sprays into both nostrils daily. 16 g 6  . gabapentin (NEURONTIN) 300 MG capsule Take 300 mg by mouth 3 (three) times daily.    . hydrocortisone cream 1 % Apply 1 application topically 2 (two) times daily as needed for itching.     Marland Kitchen ibuprofen (ADVIL,MOTRIN) 200 MG tablet Take 200-800 mg by mouth every 6 (six) hours as needed for headache or moderate pain. Depends on pain    . levothyroxine (SYNTHROID, LEVOTHROID) 50 MCG tablet Take 1 tablet (50 mcg total) by mouth daily. 90 tablet 1  . lithium carbonate 150 MG capsule Take 150 mg by mouth 3 (three) times daily with meals.    Marland Kitchen loperamide (IMODIUM A-D) 2 MG tablet Take 2 mg by mouth 4 (four) times daily as needed for diarrhea or loose stools.    . montelukast (SINGULAIR) 10 MG tablet Take 1 tablet (10 mg total) by mouth at bedtime. 30 tablet 11  . naproxen sodium (ANAPROX) 220 MG tablet Take 4 tablets (880 mg total) by mouth 2 (two) times daily as needed (headache). 60 tablet 5  . omeprazole (PRILOSEC) 40 MG capsule Take 1 capsule (40 mg total) by mouth daily. 90 capsule 3  . oxybutynin (DITROPAN) 5 MG tablet Take 1 tablet by mouth 3 (three) times daily.   11  . rizatriptan (MAXALT-MLT) 10 MG disintegrating tablet Take 1 tablet (10 mg total) by mouth daily as needed for migraine. May repeat in 2 hours if needed. Do not exceed 2 doses in 1 week period 9 tablet 3  . sertraline (ZOLOFT) 100 MG tablet Take 2 tablets (200 mg total) by mouth daily. 14 tablet 0  . Spacer/Aero-Holding Chambers (AEROCHAMBER PLUS) inhaler Use as instructed 1 each 0  . ziprasidone (GEODON) 20 MG capsule Take 20 mg by mouth 2 (two) times daily with a meal.   2  . ondansetron (ZOFRAN) 4 MG tablet Take 1 tablet (4 mg total) by mouth every 8 (eight) hours as needed for nausea. 8 tablet 5  . oxyCODONE (OXY IR/ROXICODONE) 5 MG immediate release tablet Take 1-2 tablets (5-10 mg total) by mouth every 6 (six) hours as needed for moderate pain, severe pain or breakthrough  pain. 30  tablet 0  . promethazine (PHENERGAN) 25 MG suppository Place 1 suppository (25 mg total) rectally every 6 (six) hours as needed for nausea. 5 suppository 5     Allergies  Allergen Reactions  . Amlodipine Swelling    Peripheral edema  . Pseudoephedrine Other (See Comments)    I fly off the walls   . Risperidone And Related Other (See Comments)    Stomach upset, insomnia, drooling, tremors, "jerks," sensitivity to touch.   . Aspirin Other (See Comments)    REACTION: upset stomach  . Bupivacaine Hcl Hives  . Codeine Itching  . Etodolac Rash  . Indocin [Indomethacin] Other (See Comments)    Muscle spasms  . Ivp Dye [Iodinated Diagnostic Agents] Other (See Comments)    Flushed and Fever, itch all over  . Pentazocine Lactate Itching    oozing  . Propoxyphene N-Acetaminophen Itching  . Propranolol Nausea Only  . Sulfonamide Derivatives Other (See Comments)    unknown  . Tramadol Hcl Other (See Comments)    unknown    Signed: Morton Peters, M.D., F.A.C.S. Gastrointestinal and Minimally Invasive Surgery [facility] [address], [place], [place] [address] [phone] Main / Paging   03/08/2016, 8:27 AM

## 2016-03-10 LAB — TYPE AND SCREEN
ABO/RH(D): A POS
Antibody Screen: NEGATIVE

## 2016-03-11 ENCOUNTER — Encounter: Payer: Self-pay | Admitting: Family Medicine

## 2016-03-11 ENCOUNTER — Ambulatory Visit (INDEPENDENT_AMBULATORY_CARE_PROVIDER_SITE_OTHER): Payer: Medicare HMO | Admitting: Family Medicine

## 2016-03-11 VITALS — BP 124/80 | HR 83 | Ht 60.5 in | Wt 164.0 lb

## 2016-03-11 DIAGNOSIS — Z780 Asymptomatic menopausal state: Secondary | ICD-10-CM

## 2016-03-11 DIAGNOSIS — E781 Pure hyperglyceridemia: Secondary | ICD-10-CM

## 2016-03-11 DIAGNOSIS — E039 Hypothyroidism, unspecified: Secondary | ICD-10-CM

## 2016-03-11 DIAGNOSIS — R69 Illness, unspecified: Secondary | ICD-10-CM | POA: Diagnosis not present

## 2016-03-11 DIAGNOSIS — R748 Abnormal levels of other serum enzymes: Secondary | ICD-10-CM

## 2016-03-11 DIAGNOSIS — Z1231 Encounter for screening mammogram for malignant neoplasm of breast: Secondary | ICD-10-CM

## 2016-03-11 DIAGNOSIS — Z0000 Encounter for general adult medical examination without abnormal findings: Secondary | ICD-10-CM | POA: Diagnosis not present

## 2016-03-11 DIAGNOSIS — Z1239 Encounter for other screening for malignant neoplasm of breast: Secondary | ICD-10-CM

## 2016-03-11 LAB — LIPID PANEL
CHOL/HDL RATIO: 4
Cholesterol: 247 mg/dL — ABNORMAL HIGH (ref 0–200)
HDL: 63.9 mg/dL (ref >39.00)
LDL Cholesterol: 159 mg/dL — ABNORMAL HIGH (ref 0–99)
NonHDL: 182.94
TRIGLYCERIDES: 122 mg/dL (ref 0.0–149.0)
VLDL: 24.4 mg/dL (ref 0.0–40.0)

## 2016-03-11 LAB — HEPATIC FUNCTION PANEL
ALBUMIN: 4.1 g/dL (ref 3.5–5.2)
ALK PHOS: 144 U/L — AB (ref 39–117)
ALT: 69 U/L — ABNORMAL HIGH (ref 0–35)
AST: 17 U/L (ref 0–37)
BILIRUBIN DIRECT: 0.1 mg/dL (ref 0.0–0.3)
Total Bilirubin: 0.4 mg/dL (ref 0.2–1.2)
Total Protein: 6.8 g/dL (ref 6.0–8.3)

## 2016-03-11 LAB — TSH: TSH: 0.65 u[IU]/mL (ref 0.35–4.50)

## 2016-03-11 NOTE — Progress Notes (Signed)
Pre visit review using our clinic review tool, if applicable. No additional management support is needed unless otherwise documented below in the visit note. 

## 2016-03-11 NOTE — Patient Instructions (Addendum)
A few things to remember from today's visit:   Elevated alkaline phosphatase level - Plan: Hepatic function panel  Hypertriglyceridemia - Plan: Lipid panel  Asymptomatic postmenopausal estrogen deficiency - Plan: DEXAScan  Breast cancer screening - Plan: Mammogram Digital Screening  Routine general medical examination at a health care facility    At least 150 minutes of moderate exercise per week, daily brisk walking for 15-30 min is a good exercise option. Healthy diet low in saturated (animal) fats and sweets and consisting of fresh fruits and vegetables, lean meats such as fish and white chicken and whole grains.   - Vaccines:  Tdap vaccine every 10 years.  Shingles vaccine recommended at age 56, could be given after 60 years of age but not sure about insurance coverage.  Pneumonia vaccines:  Prevnar 13 at 65 and Pneumovax at 46.  Screening recommendations for low/normal risk women:  Screening for diabetes at age 43-45 and every 3 years.  Cervical cancer prevention:  -HPV vaccination between 75-74 years old. -Pap smear starts at 60 years of age and continues periodically until 60 years old in low risk women. Pap smear every 3 years between 67 and 16 years old. Pap smear every 3 years between women 48 and older if pap smear negative and HPV screening negative.   -Breast cancer: Mammogram: There is disagreement between experts about when to start screening in low risk asymptomatic female but recent recommendations are to start screening at 44 and not later than 60 years old , every 1-2 years and after 60 yo q 2 years. Screening is recommended until 60 years old but some women can continue screening depending of healthy issues.   Colon cancer screening: starts at 60 years old until 60 years old.  Cholesterol disorder screening at age 10 and every 3 years.  PSYCJHIATRIC OFFICES AND PSYCHIATRISTS IN THE AREA   When psychiatric evaluation is discussed or recommended  referral is not necessary. This is a list of options around [place], [place] that you can call and arrange an appointment.  -[facility] [phone] [place] [phone] [facility] [phone] -[facility] [phone]  Dr Hardie Shackleton [phone] Dr Alexander Bergeron, MD [phone] Dr Chucky May, MD [phone])   Dr Allayne Butcher. Marcelino Freestone, MD [phone] Dr Geralyn Flash A. Lugo  Dr Sheralyn Boatman, MD [phone])  Dr Fredonia Highland, MD [phone]   Please be sure medication list is accurate. If a new problem present, please set up appointment sooner than planned today.

## 2016-03-11 NOTE — Progress Notes (Signed)
HPI:   Ms.Lenora B Reznicek is a 61 y.o. female, who is here today for her routine physical.  Regular exercise 3 or more time per week: Yes, walking. Following a healthy diet: Yes She lives alone.   Since her last office visit she has undergone paraesophageal hiatal hernia repair, 03/05/16. She is currently following a liquid diet, in general she's feeling well, she denies worsening abdominal pain. She has not noted fever,chills, urinary symptoms, or inability of passing gas.   Chronic medical problems: Bipolar disorder, hypothyroidism, migraine,asthma,and hyperTG among some.   Lab Results  Component Value Date   CHOL 170 08/22/2007   HDL 47.6 08/22/2007   LDLCALC 83 08/22/2007   TRIG 199 (H) 08/22/2007   CHOLHDL 3.6 CALC 08/22/2007    In the past alkaline phosphate has been elevated.She denies high alcohol intake/abuse.     Chemistry      Component Value Date/Time   NA 140 03/03/2016 1000   K 4.4 03/03/2016 1000   CL 104 03/03/2016 1000   CO2 28 03/03/2016 1000   BUN 11 03/03/2016 1000   CREATININE 0.92 03/03/2016 1000   CREATININE 1.00 11/21/2014 0959      Component Value Date/Time   CALCIUM 9.3 03/03/2016 1000   ALKPHOS 148 (H) 09/09/2015 1415   AST 17 09/09/2015 1415   ALT 11 09/09/2015 1415   BILITOT 0.4 09/09/2015 1415      She is on disability due to psychiatry disorder, bipolar and anxiety. She follows with psychiatrists at [facility] but would like to establish with another provider. She is on Lithium (among other medications) and according to pt, it was recommended for me to follow levels.She does not want to do it today because not sure about insurance coverage under CPE visit.  Pap smear 2015 negative with HPV +. She was not planning got have pap smear today due to recent surgery. She denies any vaginal bleeding or  vaginal discharge Hx of atrophic vaginitis, she is on topical Estrace 3 times per week.  Hx of STD's Denies She is not sexually  active now. G:0 M:11.  LMP 2010 after endometrial ablation, 61 yo.  Mammogram: 04/2015 Birads 1 Colonoscopy: 03/2014 DEXA: N/A  FHx for gynecologic or colon cancer negative Hep C screening: Negative, 11/21/14.  She has no concerns today.    Review of Systems  Constitutional: Positive for fatigue (no more than usual). Negative for appetite change, fever and unexpected weight change.  HENT: Positive for tinnitus (chronic). Negative for dental problem, hearing loss, nosebleeds, trouble swallowing and voice change.   Eyes: Negative for pain and visual disturbance.  Respiratory: Negative for cough, shortness of breath and wheezing.        OSA on CPAP  Cardiovascular: Negative for chest pain and leg swelling.  Gastrointestinal: Positive for abdominal pain (s/p laparoscopy Sx). Negative for blood in stool, nausea and vomiting.       No changes in bowel habits.  Endocrine: Negative for cold intolerance, heat intolerance, polydipsia, polyphagia and polyuria.  Genitourinary: Negative for decreased urine volume, hematuria, vaginal bleeding and vaginal discharge.       No breast tenderness or nipple discharge.  Musculoskeletal: Positive for arthralgias. Negative for back pain.  Skin: Negative for rash.  Allergic/Immunologic: Positive for environmental allergies.  Neurological: Negative for syncope, weakness and headaches.  Hematological: Negative for adenopathy. Does not bruise/bleed easily.  Psychiatric/Behavioral: Negative for confusion and suicidal ideas. The patient is nervous/anxious.   All other systems reviewed  and are negative.     Current Outpatient Prescriptions on File Prior to Visit  Medication Sig Dispense Refill  . albuterol (PROVENTIL HFA;VENTOLIN HFA) 108 (90 Base) MCG/ACT inhaler Inhale 1-2 puffs into the lungs daily as needed for wheezing or shortness of breath. (Patient taking differently: Inhale 2 puffs into the lungs every 6 (six) hours as needed for wheezing or  shortness of breath. ) 54 Inhaler 3  . amitriptyline (ELAVIL) 25 MG tablet Take 25 mg by mouth at bedtime.    Marland Kitchen anti-nausea (EMETROL) solution Take 30 mLs by mouth every 15 (fifteen) minutes as needed for nausea or vomiting.    Marland Kitchen azelastine (ASTELIN) 0.1 % nasal spray Place 2 sprays into both nostrils 2 (two) times daily. Use in each nostril as directed (Patient taking differently: Place 2 sprays into both nostrils daily. Use in each nostril as directed) 30 mL 4  . budesonide-formoterol (SYMBICORT) 80-4.5 MCG/ACT inhaler Inhale 2 puffs into the lungs 2 (two) times daily. 1 Inhaler 0  . cetirizine (ZYRTEC) 10 MG tablet Take 1 tablet (10 mg total) by mouth daily. 30 tablet 5  . clonazePAM (KLONOPIN) 0.5 MG tablet Take 0.5 mg by mouth at bedtime as needed for anxiety.     . diphenhydrAMINE (BENADRYL) 25 mg capsule Take 25 mg by mouth at bedtime as needed for sleep.    Marland Kitchen estradiol (ESTRACE) 0.1 MG/GM vaginal cream Place 1 Applicatorful vaginally 3 (three) times a week. 42.5 g 3  . fluticasone (FLONASE) 50 MCG/ACT nasal spray Place 2 sprays into both nostrils daily. 16 g 6  . gabapentin (NEURONTIN) 300 MG capsule Take 300 mg by mouth 3 (three) times daily.    . hydrocortisone cream 1 % Apply 1 application topically 2 (two) times daily as needed for itching.     Marland Kitchen ibuprofen (ADVIL,MOTRIN) 200 MG tablet Take 200-800 mg by mouth every 6 (six) hours as needed for headache or moderate pain. Depends on pain    . levothyroxine (SYNTHROID, LEVOTHROID) 50 MCG tablet Take 1 tablet (50 mcg total) by mouth daily. 90 tablet 1  . lithium carbonate 150 MG capsule Take 150 mg by mouth 3 (three) times daily with meals.    Marland Kitchen loperamide (IMODIUM A-D) 2 MG tablet Take 2 mg by mouth 4 (four) times daily as needed for diarrhea or loose stools.    . montelukast (SINGULAIR) 10 MG tablet Take 1 tablet (10 mg total) by mouth at bedtime. 30 tablet 11  . naproxen sodium (ANAPROX) 220 MG tablet Take 4 tablets (880 mg total) by mouth  2 (two) times daily as needed (headache). 60 tablet 5  . omeprazole (PRILOSEC) 40 MG capsule Take 1 capsule (40 mg total) by mouth daily. 90 capsule 3  . ondansetron (ZOFRAN) 4 MG tablet Take 1 tablet (4 mg total) by mouth every 8 (eight) hours as needed for nausea. 8 tablet 5  . oxybutynin (DITROPAN) 5 MG tablet Take 1 tablet by mouth 3 (three) times daily.   11  . oxyCODONE (OXY IR/ROXICODONE) 5 MG immediate release tablet Take 1-2 tablets (5-10 mg total) by mouth every 6 (six) hours as needed for moderate pain, severe pain or breakthrough pain. 30 tablet 0  . promethazine (PHENERGAN) 25 MG suppository Place 1 suppository (25 mg total) rectally every 6 (six) hours as needed for nausea. 5 suppository 5  . rizatriptan (MAXALT-MLT) 10 MG disintegrating tablet Take 1 tablet (10 mg total) by mouth daily as needed for migraine. May repeat in 2  hours if needed. Do not exceed 2 doses in 1 week period 9 tablet 3  . sertraline (ZOLOFT) 100 MG tablet Take 2 tablets (200 mg total) by mouth daily. 14 tablet 0  . Spacer/Aero-Holding Chambers (AEROCHAMBER PLUS) inhaler Use as instructed 1 each 0  . ziprasidone (GEODON) 20 MG capsule Take 20 mg by mouth 2 (two) times daily with a meal.   2   No current facility-administered medications on file prior to visit.      Past Medical History:  Diagnosis Date  . Allergy    seasonal  . Arthritis   . Asthma   . Bipolar 1 disorder ([place])   . 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 Complication of anesthesia   . Depression   . GERD (gastroesophageal reflux disease)   . Headache(784.0)   . History of frequent urinary tract infections   . History of hiatal hernia   . Hypertension   . Hypothyroidism   . Interstitial cystitis   . Neuromuscular disorder ([place]) 02/08/2012   TREMORS   . PONV (postoperative nausea and vomiting)   . Rosacea   . Shortness of breath   . Sleep apnea    has cpap,   . Thyroid disease    no present treatment 03-18-14    Allergies    Allergen Reactions  . Amlodipine Swelling    Peripheral edema  . Pseudoephedrine Other (See Comments)    I fly off the walls   . Risperidone And Related Other (See Comments)    Stomach upset, insomnia, drooling, tremors, "jerks," sensitivity to touch.   . Aspirin Other (See Comments)    REACTION: upset stomach  . Bupivacaine Hcl Hives  . Codeine Itching  . Etodolac Rash  . Indocin [Indomethacin] Other (See Comments)    Muscle spasms  . Ivp Dye [Iodinated Diagnostic Agents] Other (See Comments)    Flushed and Fever, itch all over  . Pentazocine Lactate Itching    oozing  . Propoxyphene N-Acetaminophen Itching  . Propranolol Nausea Only  . Sulfonamide Derivatives Other (See Comments)    unknown  . Tramadol Hcl Other (See Comments)    unknown    Family History  Problem Relation Age of Onset  . Hypertension Mother     deceased from MVA complications  . Thyroid disease Mother   . Testicular cancer Father   . Colon polyps Sister   . Coronary artery disease      Social History   Social History  . Marital status: Divorced    Spouse name: N/A  . Number of children: 0  . Years of education: N/A   Social History Main Topics  . Smoking status: Never Smoker  . Smokeless tobacco: Never Used  . Alcohol use 0.0 oz/week     Comment: once every 3-4 months  . Drug use: No  . Sexual activity: Not Currently   Other Topics Concern  . None   Social History Narrative  . None     Vitals:   03/11/16 1001  BP: 124/80  Pulse: 83   Body mass index is 31.5 kg/m.  O2 sat at RA 96%  Wt Readings from Last 3 Encounters:  03/11/16 164 lb (74.4 kg)  03/04/16 165 lb (74.8 kg)  03/03/16 165 lb (74.8 kg)    Physical Exam  Nursing note and vitals reviewed. Constitutional: She is oriented to person, place, and time. She appears well-developed. No distress.  HENT:  Head: Atraumatic.  Mouth/Throat: Uvula is midline, oropharynx is clear and moist and mucous membranes are  normal.  Eyes: Conjunctivae and EOM are normal. Pupils are equal, round, and reactive to light.  Neck: No tracheal deviation present. No thyroid mass and no thyromegaly present.  Cardiovascular: Normal rate and regular rhythm.   No murmur heard. Pulses:      Dorsalis pedis pulses are 2+ on the right side, and 2+ on the left side.  Respiratory: Effort normal and breath sounds normal. No respiratory distress.  GI: Soft. She exhibits no mass. There is no hepatomegaly. There is no tenderness.  Laparoscopic incisions healing well.  Musculoskeletal: She exhibits no edema.  No major deformity or signs of synovitis appreciated. Bilateral knee crepitus.  Lymphadenopathy:    She has no cervical adenopathy.       Right: No supraclavicular adenopathy present.       Left: No supraclavicular adenopathy present.  Neurological: She is alert and oriented to person, place, and time. She has normal strength. She displays tremor (mild head tremor). No cranial nerve deficit. Coordination and gait normal.  Reflex Scores:      Bicep reflexes are 2+ on the right side and 2+ on the left side.      Patellar reflexes are 2+ on the right side and 2+ on the left side. Skin: Skin is warm. No rash noted. No erythema.  Psychiatric: She has a normal mood and affect. Her speech is normal. Cognition and memory are normal.  Well groomed, good eye contact.      ASSESSMENT AND PLAN:    Rashidah was seen today for annual exam.  Diagnoses and all orders for this visit:   Lab Results  Component Value Date   CHOL 247 (H) 03/11/2016   HDL 63.90 03/11/2016   LDLCALC 159 (H) 03/11/2016   TRIG 122.0 03/11/2016   CHOLHDL 4 03/11/2016   Lab Results  Component Value Date   TSH 0.65 03/11/2016   Lab Results  Component Value Date   ALT 69 (H) 03/11/2016   AST 17 03/11/2016   ALKPHOS 144 (H) 03/11/2016   BILITOT 0.4 03/11/2016    Routine general medical examination at a health care facility   We discussed the  importance of regular physical activity and healthy diet for prevention of chronic illness and/or complications. Preventive guidelines reviewed. Vaccination up to date. Aspirin for primary prevention discussed but not recommended due to history of allergy Ca++ and vit D supplementation recommended. Next CPE in 1 year.  -     Mammogram Digital Screening; Future -     DEXAScan; Future  Elevated alkaline phosphatase level  Will continue following. Follow-up in 6 months.  -     Hepatic function panel  Hypertriglyceridemia  Continue low-fat diet, further recommendations will be given according to lab results. Follow-up in 6-12 months.  -     Lipid panel  Asymptomatic postmenopausal estrogen deficiency -     DEXAScan; Future  Breast cancer screening -     Mammogram Digital Screening; Future  Hypothyroidism, unspecified type  No changes in current management, will follow labs done today and will give further recommendations accordingly. F/U in 6 months.  -     TSH   -A list of some mental health providers in the area given.   Return in 6 months (on 09/08/2016) for Hypothyr,migraine.      Suhas Estis G. [place], MD  [facility]. [place] office.

## 2016-03-14 DIAGNOSIS — G4733 Obstructive sleep apnea (adult) (pediatric): Secondary | ICD-10-CM | POA: Diagnosis not present

## 2016-03-16 ENCOUNTER — Encounter: Payer: Self-pay | Admitting: Family Medicine

## 2016-03-17 DIAGNOSIS — R69 Illness, unspecified: Secondary | ICD-10-CM | POA: Diagnosis not present

## 2016-03-19 ENCOUNTER — Ambulatory Visit (INDEPENDENT_AMBULATORY_CARE_PROVIDER_SITE_OTHER)
Admission: RE | Admit: 2016-03-19 | Discharge: 2016-03-19 | Disposition: A | Discharge status: Home / Self Care | Payer: Medicare HMO | Source: Ambulatory Visit | Attending: Family Medicine | Admitting: Family Medicine

## 2016-03-19 DIAGNOSIS — Z780 Asymptomatic menopausal state: Secondary | ICD-10-CM | POA: Diagnosis not present

## 2016-03-19 DIAGNOSIS — Z0000 Encounter for general adult medical examination without abnormal findings: Secondary | ICD-10-CM

## 2016-03-22 ENCOUNTER — Other Ambulatory Visit: Payer: Self-pay | Admitting: Family Medicine

## 2016-03-22 DIAGNOSIS — M810 Age-related osteoporosis without current pathological fracture: Secondary | ICD-10-CM | POA: Insufficient documentation

## 2016-03-22 MED ORDER — ALENDRONATE SODIUM 70 MG PO TABS: 70.0000 mg | ORAL_TABLET | ORAL | 2 refills | Status: DC

## 2016-03-24 ENCOUNTER — Telehealth: Payer: Self-pay | Admitting: Family Medicine

## 2016-03-24 NOTE — Telephone Encounter (Signed)
Pt would like to have a call to discuss her lab results she have several questions about it.

## 2016-03-25 DIAGNOSIS — F3132 Bipolar disorder, current episode depressed, moderate: Secondary | ICD-10-CM | POA: Diagnosis not present

## 2016-03-25 DIAGNOSIS — R69 Illness, unspecified: Secondary | ICD-10-CM | POA: Diagnosis not present

## 2016-03-25 NOTE — Telephone Encounter (Signed)
Left voicemail for patient to call the office back.   

## 2016-03-26 NOTE — Telephone Encounter (Signed)
Called and spoke with patient. Went over lab results and patient verbalized understanding.

## 2016-03-29 ENCOUNTER — Telehealth: Payer: Self-pay

## 2016-03-29 NOTE — Telephone Encounter (Signed)
Received PA request for Estradiol 0.01% cream from pharmacy. PA submitted & is pending. Key: [name]

## 2016-03-29 NOTE — Telephone Encounter (Signed)
Pa approved, form faxed back to pharmacy. 

## 2016-04-01 ENCOUNTER — Other Ambulatory Visit: Payer: Self-pay | Admitting: Family Medicine

## 2016-04-01 DIAGNOSIS — R69 Illness, unspecified: Secondary | ICD-10-CM | POA: Diagnosis not present

## 2016-04-01 DIAGNOSIS — F3132 Bipolar disorder, current episode depressed, moderate: Secondary | ICD-10-CM | POA: Diagnosis not present

## 2016-04-01 DIAGNOSIS — Z1231 Encounter for screening mammogram for malignant neoplasm of breast: Secondary | ICD-10-CM

## 2016-04-05 DIAGNOSIS — R69 Illness, unspecified: Secondary | ICD-10-CM | POA: Diagnosis not present

## 2016-04-05 DIAGNOSIS — F3132 Bipolar disorder, current episode depressed, moderate: Secondary | ICD-10-CM | POA: Diagnosis not present

## 2016-04-11 DIAGNOSIS — G4733 Obstructive sleep apnea (adult) (pediatric): Secondary | ICD-10-CM | POA: Diagnosis not present

## 2016-04-12 DIAGNOSIS — F411 Generalized anxiety disorder: Secondary | ICD-10-CM | POA: Diagnosis not present

## 2016-04-12 DIAGNOSIS — R69 Illness, unspecified: Secondary | ICD-10-CM | POA: Diagnosis not present

## 2016-04-12 DIAGNOSIS — F423 Hoarding disorder: Secondary | ICD-10-CM | POA: Diagnosis not present

## 2016-04-23 ENCOUNTER — Ambulatory Visit: Payer: Self-pay

## 2016-04-26 DIAGNOSIS — F423 Hoarding disorder: Secondary | ICD-10-CM | POA: Diagnosis not present

## 2016-04-26 DIAGNOSIS — F411 Generalized anxiety disorder: Secondary | ICD-10-CM | POA: Diagnosis not present

## 2016-04-26 DIAGNOSIS — R69 Illness, unspecified: Secondary | ICD-10-CM | POA: Diagnosis not present

## 2016-04-30 DIAGNOSIS — R69 Illness, unspecified: Secondary | ICD-10-CM | POA: Diagnosis not present

## 2016-05-03 DIAGNOSIS — R69 Illness, unspecified: Secondary | ICD-10-CM | POA: Diagnosis not present

## 2016-05-03 DIAGNOSIS — F411 Generalized anxiety disorder: Secondary | ICD-10-CM | POA: Diagnosis not present

## 2016-05-03 DIAGNOSIS — Z79899 Other long term (current) drug therapy: Secondary | ICD-10-CM | POA: Diagnosis not present

## 2016-05-03 DIAGNOSIS — F423 Hoarding disorder: Secondary | ICD-10-CM | POA: Diagnosis not present

## 2016-05-10 ENCOUNTER — Ambulatory Visit
Admission: RE | Admit: 2016-05-10 | Discharge: 2016-05-10 | Disposition: A | Discharge status: Home / Self Care | Payer: Medicare HMO | Source: Ambulatory Visit | Attending: Family Medicine | Admitting: Family Medicine

## 2016-05-10 ENCOUNTER — Telehealth: Payer: Self-pay | Admitting: Pulmonary Disease

## 2016-05-10 DIAGNOSIS — Z1231 Encounter for screening mammogram for malignant neoplasm of breast: Secondary | ICD-10-CM | POA: Diagnosis not present

## 2016-05-10 MED ORDER — BUDESONIDE-FORMOTEROL FUMARATE 80-4.5 MCG/ACT IN AERO: 2.0000 | INHALATION_SPRAY | Freq: Two times a day (BID) | RESPIRATORY_TRACT | 0 refills | Status: DC

## 2016-05-10 NOTE — Telephone Encounter (Signed)
Spoke with the pt  She states that she is needing rx for symbicort  She was given sample at last visit and it worked well, and she can just now afford rx  I have sent it to the pharmacy and scheduled her for ov with AD since she is due

## 2016-05-11 DIAGNOSIS — R3912 Poor urinary stream: Secondary | ICD-10-CM | POA: Diagnosis not present

## 2016-05-11 DIAGNOSIS — N3010 Interstitial cystitis (chronic) without hematuria: Secondary | ICD-10-CM | POA: Diagnosis not present

## 2016-05-12 DIAGNOSIS — G4733 Obstructive sleep apnea (adult) (pediatric): Secondary | ICD-10-CM | POA: Diagnosis not present

## 2016-05-12 DIAGNOSIS — R69 Illness, unspecified: Secondary | ICD-10-CM | POA: Diagnosis not present

## 2016-05-12 DIAGNOSIS — F423 Hoarding disorder: Secondary | ICD-10-CM | POA: Diagnosis not present

## 2016-05-12 DIAGNOSIS — F411 Generalized anxiety disorder: Secondary | ICD-10-CM | POA: Diagnosis not present

## 2016-05-19 DIAGNOSIS — R69 Illness, unspecified: Secondary | ICD-10-CM | POA: Diagnosis not present

## 2016-05-26 DIAGNOSIS — R69 Illness, unspecified: Secondary | ICD-10-CM | POA: Diagnosis not present

## 2016-06-04 DIAGNOSIS — R69 Illness, unspecified: Secondary | ICD-10-CM | POA: Diagnosis not present

## 2016-06-08 DIAGNOSIS — G4733 Obstructive sleep apnea (adult) (pediatric): Secondary | ICD-10-CM | POA: Diagnosis not present

## 2016-06-11 DIAGNOSIS — G4733 Obstructive sleep apnea (adult) (pediatric): Secondary | ICD-10-CM | POA: Diagnosis not present

## 2016-06-15 DIAGNOSIS — F3132 Bipolar disorder, current episode depressed, moderate: Secondary | ICD-10-CM | POA: Diagnosis not present

## 2016-06-15 DIAGNOSIS — R69 Illness, unspecified: Secondary | ICD-10-CM | POA: Diagnosis not present

## 2016-06-18 ENCOUNTER — Other Ambulatory Visit: Payer: Self-pay

## 2016-06-18 MED ORDER — BUDESONIDE-FORMOTEROL FUMARATE 80-4.5 MCG/ACT IN AERO: 2.0000 | INHALATION_SPRAY | Freq: Two times a day (BID) | RESPIRATORY_TRACT | 0 refills | Status: DC

## 2016-06-23 ENCOUNTER — Encounter: Payer: Self-pay | Admitting: Pulmonary Disease

## 2016-06-24 DIAGNOSIS — R69 Illness, unspecified: Secondary | ICD-10-CM | POA: Diagnosis not present

## 2016-06-24 DIAGNOSIS — F3132 Bipolar disorder, current episode depressed, moderate: Secondary | ICD-10-CM | POA: Diagnosis not present

## 2016-06-25 ENCOUNTER — Telehealth: Payer: Self-pay | Admitting: Pulmonary Disease

## 2016-06-25 ENCOUNTER — Ambulatory Visit (INDEPENDENT_AMBULATORY_CARE_PROVIDER_SITE_OTHER): Payer: Medicare HMO | Admitting: Pulmonary Disease

## 2016-06-25 ENCOUNTER — Encounter: Payer: Self-pay | Admitting: Pulmonary Disease

## 2016-06-25 DIAGNOSIS — J45909 Unspecified asthma, uncomplicated: Secondary | ICD-10-CM

## 2016-06-25 DIAGNOSIS — Z9989 Dependence on other enabling machines and devices: Secondary | ICD-10-CM

## 2016-06-25 DIAGNOSIS — G4733 Obstructive sleep apnea (adult) (pediatric): Secondary | ICD-10-CM | POA: Diagnosis not present

## 2016-06-25 NOTE — Patient Instructions (Signed)
  It was a pleasure taking care of you today!  Continue using your CPAP machine.  We will decrease your pressure to 5-12 cm water.   Please make sure you use your CPAP device everytime you sleep.  We will monitor the usage of your machine per your insurance requirement.  Your insurance company may take the machine from you if you are not using it regularly.   Please clean the mask, tubings, filter, water reservoir with soapy water every week.  Please use distilled water for the water reservoir.   Please call the office or your machine provider (DME company) if you are having issues with the device.   Return to clinic in 1 year  with  NP/APP

## 2016-06-25 NOTE — Assessment & Plan Note (Addendum)
Patient with chronic asthma which has been stable. Has allergic rhinitis as well. She was seeing Dr. Annamaria Boots years ago. She is on Qvar but is having issues with insurance coverage. We switched her to Symbicort 80/4.5 2P BID with spacer. She uses albuterol as needed.   She complains about tremors/shaking after using Symbicort.   Told her to try to cut down the dose from  2P BID to 1 P BID.   I think the tremors are part of a bigger problem which is her anxiety, bipolar disorder being uncontrolled. I recommended her seeing a psychiatrist or psychologist and she will look into it. I discussed nonpharmacologic treatment for anxiety such as listening to soothing music, etc. She may need pharmacologic treatment for anxiety/tremors.

## 2016-06-25 NOTE — Progress Notes (Signed)
Subjective:    Patient ID: [name], female    DOB: February 16, 1955, 61 y.o.   MRN: [id number]  HPI  Pt. Presents to the office for self referral for OSA. She had previously been followed by Dr. Annamaria Boots in  the early 2000's  for OSA, with  CPAP, 3-4 years ago she lost the cord to her machine, so she stopped using the device. She has recently gained 45 pounds and is concerned that her OSA is most likely worse.She wishes to resume CPAP treatment. She goes to bed at between 12:30 am to 130 am. It takes about 1-2 hours to fall asleep.She gets up between 9am and 10 am. She does not operate heavy machinery, she does not have a Teacher, adult education. She did have a sleep study in the past per Dr. Annamaria Boots.Epworth Score is 5. She does have seasonal allergies, and environmental allergies. She does take Zyrtec generic, and phenylephrine.There is no family history of sleep apnea that the family is aware of.Pt. States she has fatigue and daytime sleepiness. Never feels like she gets enough sleep. She always wakes up tired.  ROV 01/06/16 Patient returns to the office as follow-up on her sleep apnea. Since last seen, she had a home sleep study in September which showed AHI of 13. He was started on an auto CPAP. Download the last month: 100%, AHI 2. Asthma has been stable.  ROV 06/25/16 Patient returns to the office as follow-up on her sleep apnea. Since last seen, she continues to use her CPAP machine. Download the last month, 100%, AHI 1. She is on auto CPAP. Feels better using it. More energy. Less sleepiness. I think her bipolar and anxiety are acting up. She has been nervous recently with claustrophobia and tremors. Claustrophobia has started to affect the usage of the CPAP. She has tremors when she takes the Symbicort in the morning and at night. Asthma however has been stable.  Review of Systems  Constitutional: Negative for chills, fever and unexpected weight change.  HENT: Negative for congestion, dental problem,  ear pain, nosebleeds, postnasal drip, rhinorrhea, sinus pressure, sneezing, sore throat, trouble swallowing and voice change.   Eyes: Negative for visual disturbance.  Respiratory: Negative for cough, choking and shortness of breath.   Cardiovascular: Negative for chest pain and leg swelling.  Gastrointestinal: Negative for abdominal pain, diarrhea and vomiting.  Genitourinary: Negative for difficulty urinating.  Musculoskeletal: Negative for arthralgias.  Skin: Negative for rash.  Neurological: Negative for tremors, syncope and headaches.  Hematological: Does not bruise/bleed easily.       Objective:   Physical Exam    Vitals:  Vitals:   06/25/16 1105  BP: 122/82  Pulse: 84  SpO2: 98%  Weight: 162 lb (73.5 kg)  Height: 5' 0.05" (1.525 m)    Constitutional/General:  Pleasant, well-nourished, well-developed, not in any distress,  Comfortably seating.  Well kempt  Body mass index is 31.59 kg/m. Wt Readings from Last 3 Encounters:  06/25/16 162 lb (73.5 kg)  03/11/16 164 lb (74.4 kg)  03/04/16 165 lb (74.8 kg)     HEENT: Pupils equal and reactive to light and accommodation. Anicteric sclerae. Normal nasal mucosa.   No oral  lesions,  mouth clear,  oropharynx clear, no postnasal drip. (-) Oral thrush. No dental caries.  Airway - Mallampati class III  Neck: No masses. Midline trachea. No JVD, (-) LAD. (-) bruits appreciated.  Respiratory/Chest: Grossly normal chest. (-) deformity. (-) Accessory muscle use.  Symmetric expansion. (-) Tenderness  on palpation.  Resonant on percussion.  Diminished BS on both lower lung zones. (-) wheezing, crackles, rhonchi (-) egophony  Cardiovascular: Regular rate and  rhythm, heart sounds normal, no murmur or gallops, no peripheral edema  Gastrointestinal:  Normal bowel sounds. Soft, non-tender. No hepatosplenomegaly.  (-) masses.   Musculoskeletal:  Normal muscle tone. Normal gait.   Extremities: Grossly normal. (-) clubbing,  cyanosis.  (-) edema  Skin: (-) rash,lesions seen.   Neurological/Psychiatric : alert, oriented to time, place, person. Normal mood and affect     Assessment & Plan:  OSA on CPAP Patient with hypersomnia, sleepiness, snoring, gasping. Hypersomnia affects functionality.  HST 10/2015 : AHI 13. autocpap 5-15.    Since using CPAP, she feels better. Less hypersomnia. More energy. Feels benefit of CPAP. Download the last month: 100%, AHI 1  Her bipolar disorder and anxiety are starting to act up. She has claustrophobia affecting her usage of the CPAP. Has also tremors.  Plan : We extensively discussed the importance of treating OSA and the need to use PAP therapy.   Continue with autocpap but will decrease the  pressure to 5-12 cm water from 5-15 cm water.  We will get a 1 month DL.  We discussed about mask desensitization.    Patient was instructed to have mask, tubings, filter, reservoir cleaned at least once a week with soapy water.  Patient was instructed to call the office if he/she is having issues with the PAP device.    I advised patient to obtain sufficient amount of sleep --  7 to 8 hours at least in a 24 hr period.  Patient was advised to follow good sleep hygiene.  Patient was advised NOT to engage in activities requiring concentration and/or vigilance if he/she is and  sleepy.  Patient is NOT to drive if he/she is sleepy.     Asthma Patient with chronic asthma which has been stable. Has allergic rhinitis as well. She was seeing Dr. Annamaria Boots years ago. She is on Qvar but is having issues with insurance coverage. We switched her to Symbicort 80/4.5 2P BID with spacer. She uses albuterol as needed.   She complains about tremors/shaking after using Symbicort.   Told her to try to cut down the dose from  2P BID to 1 P BID.   I think the tremors are part of a bigger problem which is her anxiety, bipolar disorder being uncontrolled. I recommended her seeing a psychiatrist or  psychologist and she will look into it. I discussed nonpharmacologic treatment for anxiety such as listening to soothing music, etc. She may need pharmacologic treatment for anxiety/tremors.     Return to clinic in 1 year.     Monica Becton, MD 06/25/2016, 11:59 AM [facility] Pager [phone] After 3 pm or if no answer, call [phone]

## 2016-06-25 NOTE — Telephone Encounter (Signed)
atc pt, line rang several times with no answer, automated message states "service is temporarily not available- please try back later" and line ended.   Wcb.

## 2016-06-25 NOTE — Assessment & Plan Note (Addendum)
Patient with hypersomnia, sleepiness, snoring, gasping. Hypersomnia affects functionality.  HST 10/2015 : AHI 13. autocpap 5-15.    Since using CPAP, she feels better. Less hypersomnia. More energy. Feels benefit of CPAP. Download the last month: 100%, AHI 1  Her bipolar disorder and anxiety are starting to act up. She has claustrophobia affecting her usage of the CPAP. Has also tremors.  Plan : We extensively discussed the importance of treating OSA and the need to use PAP therapy.   Continue with autocpap but will decrease the  pressure to 5-12 cm water from 5-15 cm water.  We will get a 1 month DL.  We discussed about mask desensitization.    Patient was instructed to have mask, tubings, filter, reservoir cleaned at least once a week with soapy water.  Patient was instructed to call the office if he/she is having issues with the PAP device.    I advised patient to obtain sufficient amount of sleep --  7 to 8 hours at least in a 24 hr period.  Patient was advised to follow good sleep hygiene.  Patient was advised NOT to engage in activities requiring concentration and/or vigilance if he/she is and  sleepy.  Patient is NOT to drive if he/she is sleepy.

## 2016-06-28 NOTE — Telephone Encounter (Signed)
lmomtcb x1 

## 2016-06-29 ENCOUNTER — Other Ambulatory Visit: Payer: Self-pay

## 2016-06-29 DIAGNOSIS — R69 Illness, unspecified: Secondary | ICD-10-CM | POA: Diagnosis not present

## 2016-06-29 DIAGNOSIS — F3132 Bipolar disorder, current episode depressed, moderate: Secondary | ICD-10-CM | POA: Diagnosis not present

## 2016-06-29 MED ORDER — BUDESONIDE-FORMOTEROL FUMARATE 80-4.5 MCG/ACT IN AERO: 2.0000 | INHALATION_SPRAY | Freq: Two times a day (BID) | RESPIRATORY_TRACT | 5 refills | Status: DC

## 2016-06-29 NOTE — Telephone Encounter (Signed)
lmomtcb x 2  

## 2016-06-30 DIAGNOSIS — Z885 Allergy status to narcotic agent status: Secondary | ICD-10-CM | POA: Diagnosis not present

## 2016-06-30 DIAGNOSIS — M778 Other enthesopathies, not elsewhere classified: Secondary | ICD-10-CM | POA: Diagnosis not present

## 2016-06-30 DIAGNOSIS — Z981 Arthrodesis status: Secondary | ICD-10-CM | POA: Diagnosis not present

## 2016-06-30 DIAGNOSIS — M1812 Unilateral primary osteoarthritis of first carpometacarpal joint, left hand: Secondary | ICD-10-CM | POA: Diagnosis not present

## 2016-06-30 DIAGNOSIS — Z9889 Other specified postprocedural states: Secondary | ICD-10-CM | POA: Diagnosis not present

## 2016-06-30 DIAGNOSIS — M19042 Primary osteoarthritis, left hand: Secondary | ICD-10-CM | POA: Diagnosis not present

## 2016-06-30 DIAGNOSIS — Z886 Allergy status to analgesic agent status: Secondary | ICD-10-CM | POA: Diagnosis not present

## 2016-06-30 DIAGNOSIS — Z888 Allergy status to other drugs, medicaments and biological substances status: Secondary | ICD-10-CM | POA: Diagnosis not present

## 2016-06-30 DIAGNOSIS — Z96698 Presence of other orthopedic joint implants: Secondary | ICD-10-CM | POA: Diagnosis not present

## 2016-06-30 NOTE — Telephone Encounter (Signed)
Spoke with the pt and confirmed that she is no longer taking the clonazepam and oxy  These were both removed from her list Nothing further needed

## 2016-06-30 NOTE — Telephone Encounter (Signed)
Pt returning call and can be reached @ [phone].[name]

## 2016-07-01 ENCOUNTER — Other Ambulatory Visit: Payer: Self-pay | Admitting: Family Medicine

## 2016-07-01 DIAGNOSIS — J310 Chronic rhinitis: Secondary | ICD-10-CM

## 2016-07-02 ENCOUNTER — Telehealth: Payer: Self-pay | Admitting: Family Medicine

## 2016-07-02 NOTE — Telephone Encounter (Signed)
Please advise 

## 2016-07-02 NOTE — Telephone Encounter (Signed)
If she received Astelin 205.5 mcg she can use 1 spray bid in each nostril or 2 sprays in each nostril once daily.  Thanks, BJ

## 2016-07-02 NOTE — Telephone Encounter (Signed)
Left voicemail for patient to call the office back.   

## 2016-07-02 NOTE — Telephone Encounter (Signed)
Pt states her rx  azelastine (ASTELIN) 0.1 % nasal spray Script she picked up was 205.5 mcg  Pt states her last rx was 137 mcg.  Pt wants to make sure this rx change was intentional and maybe she should only do one spray per nostril

## 2016-07-02 NOTE — Telephone Encounter (Signed)
Patient returned my call. Went over information below and patient will do one spray bid in each nostril since she already has the Rx.

## 2016-07-12 DIAGNOSIS — G4733 Obstructive sleep apnea (adult) (pediatric): Secondary | ICD-10-CM | POA: Diagnosis not present

## 2016-07-12 DIAGNOSIS — R69 Illness, unspecified: Secondary | ICD-10-CM | POA: Diagnosis not present

## 2016-07-14 DIAGNOSIS — R69 Illness, unspecified: Secondary | ICD-10-CM | POA: Diagnosis not present

## 2016-07-20 DIAGNOSIS — R69 Illness, unspecified: Secondary | ICD-10-CM | POA: Diagnosis not present

## 2016-08-03 DIAGNOSIS — R69 Illness, unspecified: Secondary | ICD-10-CM | POA: Diagnosis not present

## 2016-08-05 ENCOUNTER — Other Ambulatory Visit: Payer: Self-pay | Admitting: Family Medicine

## 2016-08-05 DIAGNOSIS — R69 Illness, unspecified: Secondary | ICD-10-CM | POA: Diagnosis not present

## 2016-08-05 DIAGNOSIS — E039 Hypothyroidism, unspecified: Secondary | ICD-10-CM

## 2016-08-10 DIAGNOSIS — R69 Illness, unspecified: Secondary | ICD-10-CM | POA: Diagnosis not present

## 2016-08-10 DIAGNOSIS — F411 Generalized anxiety disorder: Secondary | ICD-10-CM | POA: Diagnosis not present

## 2016-08-11 DIAGNOSIS — G4733 Obstructive sleep apnea (adult) (pediatric): Secondary | ICD-10-CM | POA: Diagnosis not present

## 2016-08-17 DIAGNOSIS — G43909 Migraine, unspecified, not intractable, without status migrainosus: Secondary | ICD-10-CM | POA: Diagnosis not present

## 2016-08-17 DIAGNOSIS — M1812 Unilateral primary osteoarthritis of first carpometacarpal joint, left hand: Secondary | ICD-10-CM | POA: Diagnosis not present

## 2016-08-17 DIAGNOSIS — M19042 Primary osteoarthritis, left hand: Secondary | ICD-10-CM | POA: Diagnosis not present

## 2016-08-17 DIAGNOSIS — G4733 Obstructive sleep apnea (adult) (pediatric): Secondary | ICD-10-CM | POA: Diagnosis not present

## 2016-08-17 DIAGNOSIS — K219 Gastro-esophageal reflux disease without esophagitis: Secondary | ICD-10-CM | POA: Diagnosis not present

## 2016-08-17 DIAGNOSIS — N3010 Interstitial cystitis (chronic) without hematuria: Secondary | ICD-10-CM | POA: Diagnosis not present

## 2016-08-17 DIAGNOSIS — R69 Illness, unspecified: Secondary | ICD-10-CM | POA: Diagnosis not present

## 2016-08-17 DIAGNOSIS — E039 Hypothyroidism, unspecified: Secondary | ICD-10-CM | POA: Diagnosis not present

## 2016-08-17 DIAGNOSIS — J45909 Unspecified asthma, uncomplicated: Secondary | ICD-10-CM | POA: Diagnosis not present

## 2016-08-24 DIAGNOSIS — F411 Generalized anxiety disorder: Secondary | ICD-10-CM | POA: Diagnosis not present

## 2016-08-24 DIAGNOSIS — R69 Illness, unspecified: Secondary | ICD-10-CM | POA: Diagnosis not present

## 2016-08-31 DIAGNOSIS — R69 Illness, unspecified: Secondary | ICD-10-CM | POA: Diagnosis not present

## 2016-08-31 DIAGNOSIS — F411 Generalized anxiety disorder: Secondary | ICD-10-CM | POA: Diagnosis not present

## 2016-08-31 DIAGNOSIS — M19042 Primary osteoarthritis, left hand: Secondary | ICD-10-CM | POA: Diagnosis not present

## 2016-08-31 DIAGNOSIS — Z4789 Encounter for other orthopedic aftercare: Secondary | ICD-10-CM | POA: Diagnosis not present

## 2016-09-04 ENCOUNTER — Other Ambulatory Visit: Payer: Self-pay | Admitting: Family Medicine

## 2016-09-04 DIAGNOSIS — J310 Chronic rhinitis: Secondary | ICD-10-CM

## 2016-09-06 NOTE — Progress Notes (Signed)
HPI:   [name] is a 61 y.o. female, who is here today for 6 months f/u on some chronic medical problems.  She was seen on 03/11/16 for her CPE. Since her last OV she has followed with ortho, Dr Sheryle Spray.   Hypothyroidism:  Currently she is on Levothyroxine 50 mcg daily.  Tolerating medication well, no side effects reported. She has not noted dysphagia, palpitations, abdominal pain, changes in bowel habits, tremor, cold/heat intolerance, or abnormal weight loss.  Lab Results  Component Value Date   TSH 0.65 03/11/2016   Abnormal LFT's  Lab Results  Component Value Date   ALT 69 (H) 03/11/2016   AST 17 03/11/2016   ALKPHOS 144 (H) 03/11/2016   BILITOT 0.4 03/11/2016    Concerns today:  C/O hair loss for the past 6 weeks and dry skin, she is concerned about not been on the "right dose." She denies scalp lesions, neg FHX for alopecia, denies unusual stress or recent serious illness. She has finger surgery a couple weeks ago.  She is also reporting new Dx of Vit D deficiency, she is on OTC Vit D 5000 U bid. Vit D was checked at [facility] in 07/2016.  Bipolar disorder, she is following psychiatrists q 3 months (NP) and psychotherapy q 1-2 weeks.She is planning on changing psychiatrists because she is not satisfied with current provider.   Obesity: She is also concerned about wt gain. She does not exercise regularly. In general she thinks she eats healthy, she has not changed her diet significantly but she seldom eats fast food. She drinks regular sodas x 3 daily. She eats fruits,vegetables, and grains. She is not counting calories.  Skin lesions on left eye brow, which she noted about 3 months ago, slowly growing. Not tender and has not noted easy bleeding. Hx of SCC and in situ melanoma. She follows with dermatologists periodically and next appt in 11/2016.  For "a few months" clear crusty drainage right eye noted in the morning when she first wakes up.  Usually she does not have problems during the day. No new eye make up.  Occasionally pruritus. No conjunctival erythema or visual changes.  Last eye exam 02/2016.  She also needs a pap smear. Last OV she did not want to do it because she was post op. Last pap smear in 2015 with + HPV. She denies vaginal bleeding or discharge.   Hyperlipidemia:  She also would like FLP re-checked. Currently on non pharmacologic treatment. Following a low fat diet: Yes.   Lab Results  Component Value Date   CHOL 247 (H) 03/11/2016   HDL 63.90 03/11/2016   LDLCALC 159 (H) 03/11/2016   TRIG 122.0 03/11/2016   CHOLHDL 4 03/11/2016     Review of Systems  Constitutional: Positive for fatigue. Negative for appetite change, diaphoresis and fever.  HENT: Negative for congestion, facial swelling, mouth sores, nosebleeds, sore throat and trouble swallowing.   Eyes: Positive for discharge and itching. Negative for redness and visual disturbance.  Respiratory: Negative for cough, shortness of breath and wheezing.   Cardiovascular: Negative for chest pain, palpitations and leg swelling.  Gastrointestinal: Negative for abdominal pain, nausea and vomiting.       Negative for changes in bowel habits.  Endocrine: Negative for cold intolerance and heat intolerance.  Genitourinary: Negative for decreased urine volume and hematuria.  Skin: Negative for pallor and wound.  Allergic/Immunologic: Positive for environmental allergies.  Neurological: Negative for syncope, weakness and headaches.  Hematological: Negative for adenopathy. Does not bruise/bleed easily.  Psychiatric/Behavioral: Negative for confusion. The patient is nervous/anxious.       Current Outpatient Prescriptions on File Prior to Visit  Medication Sig Dispense Refill  . albuterol (PROVENTIL HFA;VENTOLIN HFA) 108 (90 Base) MCG/ACT inhaler Inhale 1-2 puffs into the lungs daily as needed for wheezing or shortness of breath. (Patient taking  differently: Inhale 2 puffs into the lungs every 6 (six) hours as needed for wheezing or shortness of breath. ) 54 Inhaler 3  . alendronate (FOSAMAX) 70 MG tablet Take 1 tablet (70 mg total) by mouth every 7 (seven) days. Take with a full glass of water on an empty stomach. 13 tablet 2  . amitriptyline (ELAVIL) 25 MG tablet Take 25 mg by mouth at bedtime.    Marland Kitchen anti-nausea (EMETROL) solution Take 30 mLs by mouth every 15 (fifteen) minutes as needed for nausea or vomiting.    Marland Kitchen azelastine (ASTELIN) 0.1 % nasal spray PLACE 2 SPRAYS IN BOTH NOSTRILS TWICE DAILY 30 mL 3  . budesonide-formoterol (SYMBICORT) 80-4.5 MCG/ACT inhaler Inhale 2 puffs into the lungs 2 (two) times daily. 1 Inhaler 5  . cetirizine (ZYRTEC) 10 MG tablet Take 1 tablet (10 mg total) by mouth daily. 30 tablet 5  . diphenhydrAMINE (BENADRYL) 25 mg capsule Take 25 mg by mouth at bedtime as needed for sleep.    Marland Kitchen estradiol (ESTRACE) 0.1 MG/GM vaginal cream Place 1 Applicatorful vaginally 3 (three) times a week. 42.5 g 3  . fluticasone (FLONASE) 50 MCG/ACT nasal spray USE 2 SPRAYS IN EACH NOSTRIL DAILY 16 g 5  . gabapentin (NEURONTIN) 300 MG capsule Take 300 mg by mouth 3 (three) times daily.    . hydrocortisone cream 1 % Apply 1 application topically 2 (two) times daily as needed for itching.     Marland Kitchen ibuprofen (ADVIL,MOTRIN) 200 MG tablet Take 200-800 mg by mouth every 6 (six) hours as needed for headache or moderate pain. Depends on pain    . levothyroxine (SYNTHROID, LEVOTHROID) 50 MCG tablet TAKE 1 TABLET EVERY DAY 90 tablet 1  . lithium carbonate 150 MG capsule Take 150 mg by mouth 3 (three) times daily with meals.    Marland Kitchen loperamide (IMODIUM A-D) 2 MG tablet Take 2 mg by mouth 4 (four) times daily as needed for diarrhea or loose stools.    . montelukast (SINGULAIR) 10 MG tablet Take 1 tablet (10 mg total) by mouth at bedtime. 30 tablet 11  . naproxen sodium (ANAPROX) 220 MG tablet Take 4 tablets (880 mg total) by mouth 2 (two) times  daily as needed (headache). 60 tablet 5  . omeprazole (PRILOSEC) 40 MG capsule Take 1 capsule (40 mg total) by mouth daily. 90 capsule 3  . ondansetron (ZOFRAN) 4 MG tablet Take 1 tablet (4 mg total) by mouth every 8 (eight) hours as needed for nausea. 8 tablet 5  . oxybutynin (DITROPAN) 5 MG tablet Take 1 tablet by mouth 3 (three) times daily.   11  . promethazine (PHENERGAN) 12.5 MG tablet     . rizatriptan (MAXALT-MLT) 10 MG disintegrating tablet Take 1 tablet (10 mg total) by mouth daily as needed for migraine. May repeat in 2 hours if needed. Do not exceed 2 doses in 1 week period 9 tablet 3  . sertraline (ZOLOFT) 100 MG tablet Take 2 tablets (200 mg total) by mouth daily. 14 tablet 0  . Spacer/Aero-Holding Chambers (AEROCHAMBER PLUS) inhaler Use as instructed 1 each 0  . ziprasidone (  GEODON) 20 MG capsule Take 20 mg by mouth 2 (two) times daily with a meal.   2   No current facility-administered medications on file prior to visit.      Past Medical History:  Diagnosis Date  . Allergy    seasonal  . Arthritis   . Asthma   . Bipolar 1 disorder ([place])   . 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 Complication of anesthesia   . Depression   . GERD (gastroesophageal reflux disease)   . Headache(784.0)   . History of frequent urinary tract infections   . History of hiatal hernia   . Hypertension   . Hypothyroidism   . Interstitial cystitis   . Neuromuscular disorder ([place]) 02/08/2012   TREMORS   . PONV (postoperative nausea and vomiting)   . Rosacea   . Shortness of breath   . Sleep apnea    has cpap,   . Thyroid disease    no present treatment 03-18-14   Allergies  Allergen Reactions  . Amlodipine Swelling    Peripheral edema  . Pseudoephedrine Other (See Comments)    I fly off the walls   . Risperidone And Related Other (See Comments)    Stomach upset, insomnia, drooling, tremors, "jerks," sensitivity to touch.   . Aspirin Other (See Comments)    REACTION: upset  stomach  . Bupivacaine Hcl Hives  . Codeine Itching  . Etodolac Rash  . Indocin [Indomethacin] Other (See Comments)    Muscle spasms  . Ivp Dye [Iodinated Diagnostic Agents] Other (See Comments)    Flushed and Fever, itch all over  . Pentazocine Lactate Itching    oozing  . Propoxyphene N-Acetaminophen Itching  . Propranolol Nausea Only  . Sulfonamide Derivatives Other (See Comments)    unknown  . Tramadol Hcl Other (See Comments)    unknown    Social History   Social History  . Marital status: Divorced    Spouse name: N/A  . Number of children: 0  . Years of education: N/A   Social History Main Topics  . Smoking status: Never Smoker  . Smokeless tobacco: Never Used  . Alcohol use 0.0 oz/week     Comment: once every 3-4 months  . Drug use: No  . Sexual activity: Not Currently   Other Topics Concern  . None   Social History Narrative  . None    Vitals:   09/07/16 1345  BP: 128/80  Pulse: 100  Resp: 12  O2 sat at RA 97% Body mass index is 32.63 kg/m.   Physical Exam  Nursing note and vitals reviewed. Constitutional: She is oriented to person, place, and time. She appears well-developed. No distress.  HENT:  Head: Atraumatic.  Mouth/Throat: Oropharynx is clear and moist and mucous membranes are normal.  Eyes: Pupils are equal, round, and reactive to light. Conjunctivae and EOM are normal.  Neck: No tracheal deviation present. No thyroid mass and no thyromegaly present.  Cardiovascular: Normal rate and regular rhythm.   No murmur heard. Pulses:      Dorsalis pedis pulses are 2+ on the right side, and 2+ on the left side.  Respiratory: Effort normal and breath sounds normal. No respiratory distress.  GI: Soft. She exhibits no mass. There is no hepatomegaly. There is no tenderness.  Musculoskeletal: She exhibits edema (Trace pitting LE edema,bilateral.).  Lymphadenopathy:    She has no cervical adenopathy.  Neurological: She is alert and oriented to  person, place, and time. She has normal strength. Coordination and gait normal.  Skin: Skin is warm. Lesion noted. No erythema.  On left eye brow,lateral end, 3 mm  Slightly raised tanned lesion, regular borders, mildly scaly. No tender.  On scalp hair thinning on parietal area, no alopecic area or skin lesions appreciated.   Psychiatric: Her mood appears anxious.  Well groomed, good eye contact.    ASSESSMENT AND PLAN:   [name] was seen today for follow-up.  Diagnoses and all orders for this visit:  Hypothyroidism, unspecified type  No changes in current management, will follow labs and will give further recommendations accordingly. F/U in 6-12 months.  -     TSH; Future -     T4, free; Future  Abnormal LFTs (liver function tests)  Asymptomatic. Denies alcohol abuse. Further recommendations will be given according to lab results.  -     Comprehensive metabolic panel; Future  Hair loss disorder  Possible causes discussed, ? Telogen effluvium. Hair pull test x hairs. Further recommendations will be given according to lab results. She can also discuss problem with her dermatologists.  -     CBC; Future -     Comprehensive metabolic panel; Future  Class 1 obesity without serious comorbidity with body mass index (BMI) of 32.0 to 32.9 in adult, unspecified obesity type  Some of medications she takes can cause or aggravate problem. We discussed benefits of wt loss as well as adverse effects of obesity. Consistency with healthy diet and physical activity recommended. Daily brisk walking for 15-30 min as tolerated or other type of regular physical activity recommended as well as calorie count and labels check. Also replace sodas for water.   Hyperlipidemia, unspecified hyperlipidemia type  She is not fasting today. Future lab placed. Further recommendations will be given accordingly.   -     Lipid panel; Future  Skin lesion of face  Lesion does not seem  suspicious,? SK. Recommend following with dermatologists.  She needs to have pap smear but today she had other concerns,so we could not do it. Instructed to arrange appt her or with gyn.   -[name] was advised to return sooner than planned today if new concerns arise.       Cailie Bosshart G. [place], MD  [facility]. [place] office.

## 2016-09-07 ENCOUNTER — Encounter: Payer: Self-pay | Admitting: Family Medicine

## 2016-09-07 ENCOUNTER — Ambulatory Visit (INDEPENDENT_AMBULATORY_CARE_PROVIDER_SITE_OTHER): Payer: Medicare HMO | Admitting: Family Medicine

## 2016-09-07 VITALS — BP 128/80 | HR 100 | Resp 12 | Ht 60.05 in | Wt 167.4 lb

## 2016-09-07 DIAGNOSIS — F411 Generalized anxiety disorder: Secondary | ICD-10-CM | POA: Diagnosis not present

## 2016-09-07 DIAGNOSIS — L989 Disorder of the skin and subcutaneous tissue, unspecified: Secondary | ICD-10-CM

## 2016-09-07 DIAGNOSIS — Z6832 Body mass index (BMI) 32.0-32.9, adult: Secondary | ICD-10-CM | POA: Diagnosis not present

## 2016-09-07 DIAGNOSIS — L659 Nonscarring hair loss, unspecified: Secondary | ICD-10-CM

## 2016-09-07 DIAGNOSIS — R69 Illness, unspecified: Secondary | ICD-10-CM | POA: Diagnosis not present

## 2016-09-07 DIAGNOSIS — E039 Hypothyroidism, unspecified: Secondary | ICD-10-CM

## 2016-09-07 DIAGNOSIS — R945 Abnormal results of liver function studies: Secondary | ICD-10-CM

## 2016-09-07 DIAGNOSIS — R7989 Other specified abnormal findings of blood chemistry: Secondary | ICD-10-CM

## 2016-09-07 DIAGNOSIS — E669 Obesity, unspecified: Secondary | ICD-10-CM | POA: Diagnosis not present

## 2016-09-07 DIAGNOSIS — E785 Hyperlipidemia, unspecified: Secondary | ICD-10-CM | POA: Insufficient documentation

## 2016-09-07 NOTE — Patient Instructions (Signed)
A few things to remember from today's visit:   Hypothyroidism, unspecified type  Abnormal LFTs (liver function tests)  Hair loss disorder  Skin lesion of face  Keep dermatologists in 11/2016.  We have ordered labs or studies at this visit.  It can take up to 1-2 weeks for results and processing. IF results require follow up or explanation, we will call you with instructions. Clinically stable results will be released to your [facility]. If you have not heard from [place] or cannot find your results in [facility] in 2 weeks please contact our office at [phone].  If you are not yet signed up for [facility], please consider signing up  Please be sure medication list is accurate. If a new problem present, please set up appointment sooner than planned today.

## 2016-09-09 ENCOUNTER — Other Ambulatory Visit: Payer: Medicare HMO

## 2016-09-11 DIAGNOSIS — G4733 Obstructive sleep apnea (adult) (pediatric): Secondary | ICD-10-CM | POA: Diagnosis not present

## 2016-09-14 ENCOUNTER — Other Ambulatory Visit (INDEPENDENT_AMBULATORY_CARE_PROVIDER_SITE_OTHER): Payer: Medicare HMO

## 2016-09-14 DIAGNOSIS — E039 Hypothyroidism, unspecified: Secondary | ICD-10-CM

## 2016-09-14 DIAGNOSIS — E785 Hyperlipidemia, unspecified: Secondary | ICD-10-CM | POA: Diagnosis not present

## 2016-09-14 DIAGNOSIS — R945 Abnormal results of liver function studies: Secondary | ICD-10-CM

## 2016-09-14 DIAGNOSIS — L659 Nonscarring hair loss, unspecified: Secondary | ICD-10-CM

## 2016-09-14 DIAGNOSIS — R7989 Other specified abnormal findings of blood chemistry: Secondary | ICD-10-CM

## 2016-09-14 LAB — LIPID PANEL
CHOL/HDL RATIO: 4
CHOLESTEROL: 244 mg/dL — AB (ref 0–200)
HDL: 59.9 mg/dL (ref >39.00)
NonHDL: 184.42
TRIGLYCERIDES: 222 mg/dL — AB (ref 0.0–149.0)
VLDL: 44.4 mg/dL — AB (ref 0.0–40.0)

## 2016-09-14 LAB — COMPREHENSIVE METABOLIC PANEL
ALBUMIN: 3.9 g/dL (ref 3.5–5.2)
ALT: 11 U/L (ref 0–35)
AST: 13 U/L (ref 0–37)
Alkaline Phosphatase: 128 U/L — ABNORMAL HIGH (ref 39–117)
BUN: 7 mg/dL (ref 6–23)
CALCIUM: 9.4 mg/dL (ref 8.4–10.5)
CHLORIDE: 105 meq/L (ref 96–112)
CO2: 29 mEq/L (ref 19–32)
Creatinine, Ser: 1 mg/dL (ref 0.40–1.20)
GFR: 60.14 mL/min (ref >60.00)
Glucose, Bld: 98 mg/dL (ref 70–99)
POTASSIUM: 4.2 meq/L (ref 3.5–5.1)
Sodium: 142 mEq/L (ref 135–145)
Total Bilirubin: 0.6 mg/dL (ref 0.2–1.2)
Total Protein: 6.7 g/dL (ref 6.0–8.3)

## 2016-09-14 LAB — TSH: TSH: 1.4 u[IU]/mL (ref 0.35–4.50)

## 2016-09-14 LAB — T4, FREE: Free T4: 0.89 ng/dL (ref 0.60–1.60)

## 2016-09-14 LAB — CBC
HEMATOCRIT: 40.3 % (ref 36.0–46.0)
Hemoglobin: 13.3 g/dL (ref 12.0–15.0)
MCHC: 32.9 g/dL (ref 30.0–36.0)
MCV: 88.8 fl (ref 78.0–100.0)
PLATELETS: 253 10*3/uL (ref 150.0–400.0)
RBC: 4.54 Mil/uL (ref 3.87–5.11)
RDW: 15.4 % (ref 11.5–15.5)
WBC: 8.6 10*3/uL (ref 4.0–10.5)

## 2016-09-14 LAB — LDL CHOLESTEROL, DIRECT: Direct LDL: 143 mg/dL

## 2016-09-15 DIAGNOSIS — F411 Generalized anxiety disorder: Secondary | ICD-10-CM | POA: Diagnosis not present

## 2016-09-15 DIAGNOSIS — R69 Illness, unspecified: Secondary | ICD-10-CM | POA: Diagnosis not present

## 2016-09-20 DIAGNOSIS — F411 Generalized anxiety disorder: Secondary | ICD-10-CM | POA: Diagnosis not present

## 2016-09-20 DIAGNOSIS — R69 Illness, unspecified: Secondary | ICD-10-CM | POA: Diagnosis not present

## 2016-09-24 ENCOUNTER — Telehealth: Payer: Self-pay | Admitting: Family Medicine

## 2016-09-24 NOTE — Telephone Encounter (Signed)
Pt is returning sarah call °

## 2016-09-24 NOTE — Telephone Encounter (Signed)
See result note.  

## 2016-09-28 ENCOUNTER — Encounter: Payer: Self-pay | Admitting: Family Medicine

## 2016-09-28 DIAGNOSIS — F411 Generalized anxiety disorder: Secondary | ICD-10-CM | POA: Diagnosis not present

## 2016-09-28 DIAGNOSIS — R69 Illness, unspecified: Secondary | ICD-10-CM | POA: Diagnosis not present

## 2016-09-29 DIAGNOSIS — Z9889 Other specified postprocedural states: Secondary | ICD-10-CM | POA: Diagnosis not present

## 2016-09-29 DIAGNOSIS — Z4789 Encounter for other orthopedic aftercare: Secondary | ICD-10-CM | POA: Diagnosis not present

## 2016-09-29 DIAGNOSIS — Z885 Allergy status to narcotic agent status: Secondary | ICD-10-CM | POA: Diagnosis not present

## 2016-09-29 DIAGNOSIS — M7989 Other specified soft tissue disorders: Secondary | ICD-10-CM | POA: Diagnosis not present

## 2016-09-29 DIAGNOSIS — Z888 Allergy status to other drugs, medicaments and biological substances status: Secondary | ICD-10-CM | POA: Diagnosis not present

## 2016-09-29 DIAGNOSIS — M19042 Primary osteoarthritis, left hand: Secondary | ICD-10-CM | POA: Diagnosis not present

## 2016-09-29 DIAGNOSIS — Z981 Arthrodesis status: Secondary | ICD-10-CM | POA: Diagnosis not present

## 2016-09-29 DIAGNOSIS — Z886 Allergy status to analgesic agent status: Secondary | ICD-10-CM | POA: Diagnosis not present

## 2016-10-07 DIAGNOSIS — R69 Illness, unspecified: Secondary | ICD-10-CM | POA: Diagnosis not present

## 2016-10-13 ENCOUNTER — Other Ambulatory Visit (HOSPITAL_COMMUNITY)
Admission: RE | Admit: 2016-10-13 | Discharge: 2016-10-13 | Disposition: A | Discharge status: Home / Self Care | Payer: Medicare HMO | Source: Ambulatory Visit | Attending: Family Medicine | Admitting: Family Medicine

## 2016-10-13 ENCOUNTER — Ambulatory Visit (INDEPENDENT_AMBULATORY_CARE_PROVIDER_SITE_OTHER): Payer: Medicare HMO | Admitting: Family Medicine

## 2016-10-13 ENCOUNTER — Encounter: Payer: Self-pay | Admitting: Family Medicine

## 2016-10-13 VITALS — BP 120/76 | HR 100 | Resp 12 | Ht 60.05 in | Wt 168.0 lb

## 2016-10-13 DIAGNOSIS — Z124 Encounter for screening for malignant neoplasm of cervix: Secondary | ICD-10-CM | POA: Diagnosis not present

## 2016-10-13 DIAGNOSIS — J310 Chronic rhinitis: Secondary | ICD-10-CM

## 2016-10-13 DIAGNOSIS — N952 Postmenopausal atrophic vaginitis: Secondary | ICD-10-CM | POA: Diagnosis not present

## 2016-10-13 DIAGNOSIS — Z23 Encounter for immunization: Secondary | ICD-10-CM | POA: Diagnosis not present

## 2016-10-13 DIAGNOSIS — E559 Vitamin D deficiency, unspecified: Secondary | ICD-10-CM | POA: Diagnosis not present

## 2016-10-13 DIAGNOSIS — G43809 Other migraine, not intractable, without status migrainosus: Secondary | ICD-10-CM | POA: Diagnosis not present

## 2016-10-13 DIAGNOSIS — R102 Pelvic and perineal pain: Secondary | ICD-10-CM | POA: Diagnosis not present

## 2016-10-13 DIAGNOSIS — L03315 Cellulitis of perineum: Secondary | ICD-10-CM

## 2016-10-13 MED ORDER — SIMVASTATIN 20 MG PO TABS: 20.0000 mg | ORAL_TABLET | Freq: Every day | ORAL | 3 refills | Status: DC

## 2016-10-13 MED ORDER — VITAMIN D-3 125 MCG (5000 UT) PO TABS: 2.0000 | ORAL_TABLET | Freq: Every day | ORAL | 1 refills | Status: DC

## 2016-10-13 MED ORDER — ESTRADIOL 0.1 MG/GM VA CREA: 1.0000 | TOPICAL_CREAM | VAGINAL | 6 refills | Status: DC

## 2016-10-13 MED ORDER — FLUTICASONE PROPIONATE 50 MCG/ACT NA SUSP: 2.0000 | Freq: Every day | NASAL | 10 refills | Status: DC

## 2016-10-13 MED ORDER — RIZATRIPTAN BENZOATE 10 MG PO TBDP: 10.0000 mg | ORAL_TABLET | Freq: Every day | ORAL | 3 refills | Status: DC | PRN

## 2016-10-13 MED ORDER — CLOTRIMAZOLE-BETAMETHASONE 1-0.05 % EX CREA: 1.0000 "application " | TOPICAL_CREAM | Freq: Two times a day (BID) | CUTANEOUS | 0 refills | Status: AC

## 2016-10-13 MED ORDER — DOXYCYCLINE HYCLATE 100 MG PO TABS: 100.0000 mg | ORAL_TABLET | Freq: Two times a day (BID) | ORAL | 0 refills | Status: AC

## 2016-10-13 NOTE — Patient Instructions (Signed)
A few things to remember from today's visit:   Other migraine without status migrainosus, not intractable - Plan: rizatriptan (MAXALT-MLT) 10 MG disintegrating tablet  Atrophic vaginitis - Plan: estradiol (ESTRACE) 0.1 MG/GM vaginal cream  Chronic rhinitis - Plan: fluticasone (FLONASE) 50 MCG/ACT nasal spray  Cellulitis, perineum - Plan: doxycycline (VIBRA-TABS) 100 MG tablet  Pelvic pain in female - Plan: US Pelvis Complete, US Transvaginal Non-OB   Please be sure medication list is accurate. If a new problem present, please set up appointment sooner than planned today.

## 2016-10-13 NOTE — Progress Notes (Signed)
HPI:   [name] is a 61 y.o. female, who is here today to have her pap smear done. She was here last on  09/07/2017 for follow-up on some concerns. She had her last CPE on 03/11/2016, at that time she should have surgery, so she didn't want to have a Pap smear.  Last Pap smear in 2015 was negative for malignancy but positive for HPV. Denies Hx of STD's. She is not sexually active. M: 11 G:0 LMP 2010, age 71.   Today she is also concerned about 2 months of lower abdominal pain, RLQ>LLQ, intermittently, mild to moderate. Pain starts suddenly and last a few seconds. She has not identified exacerbating or alleviating factors. She denies associated urinary symptoms or changes in bowel habits. Hx of interstitial cystitis, she follows with urologists. Denies dysuria,increased urinary frequency, gross hematuria,or decreased urine output.   Last bowel movement was today. She denies associated fever, chills, nausea, vomiting, vaginal bleeding, or vaginal discharge.   Skin rash: Noted about 10 days ago, attributed to wearing jeans for hours in a hot and humid environment. She denies pruritus, lesion is tender. She has been applying OTC antibiotic ointment. + Oozing.   She is also requesting refill on some of her medications:  Estradiol cream, which she uses 3 times per week for vaginal atrophy.  Maxalt 10 mg, which she takes for migraines, 2-3 times per month.  She has seen neuro in the past, 02/2015, for tremors. She was supposed to have 6 months f/u. She denies new associated symptoms and medication still helps.   Flonase nasal spray, which she uses daily as needed for allergic rhinitis. Medications have been well-tolerated, she denies side effects.  She is also requesting refills for vitamin D 5000 units, which was originally prescribed by her psychiatrist because low vitamin D. According to patient, she is seeing a different psychiatrist, new provider refused to  fill prescription. She is taking vitamin D 5000 units 2 tablets daily. Reporting last vitamin D check about 3 months ago.   Review of Systems  Constitutional: Positive for fatigue. Negative for activity change, appetite change and fever.  HENT: Negative for mouth sores, nosebleeds and trouble swallowing.   Eyes: Negative for redness and visual disturbance.  Respiratory: Negative for shortness of breath and wheezing.   Cardiovascular: Negative for chest pain, palpitations and leg swelling.  Gastrointestinal: Positive for abdominal pain. Negative for blood in stool, nausea and vomiting.       Negative for changes in bowel habits.  Endocrine: Negative for cold intolerance and heat intolerance.  Genitourinary: Positive for pelvic pain. Negative for decreased urine volume, dysuria, hematuria, vaginal bleeding and vaginal discharge.  Musculoskeletal: Negative for gait problem and myalgias.  Skin: Positive for rash.  Allergic/Immunologic: Positive for environmental allergies.  Neurological: Positive for headaches. Negative for syncope and weakness.  Hematological: Negative for adenopathy. Does not bruise/bleed easily.  Psychiatric/Behavioral: Negative for confusion. The patient is nervous/anxious.       Current Outpatient Prescriptions on File Prior to Visit  Medication Sig Dispense Refill  . albuterol (PROVENTIL HFA;VENTOLIN HFA) 108 (90 Base) MCG/ACT inhaler Inhale 1-2 puffs into the lungs daily as needed for wheezing or shortness of breath. (Patient taking differently: Inhale 2 puffs into the lungs every 6 (six) hours as needed for wheezing or shortness of breath. ) 54 Inhaler 3  . alendronate (FOSAMAX) 70 MG tablet Take 1 tablet (70 mg total) by mouth every 7 (seven) days. Take with  a full glass of water on an empty stomach. 13 tablet 2  . amitriptyline (ELAVIL) 25 MG tablet Take 25 mg by mouth at bedtime.    Marland Kitchen anti-nausea (EMETROL) solution Take 30 mLs by mouth every 15 (fifteen) minutes as  needed for nausea or vomiting.    Marland Kitchen azelastine (ASTELIN) 0.1 % nasal spray PLACE 2 SPRAYS IN BOTH NOSTRILS TWICE DAILY 30 mL 3  . budesonide-formoterol (SYMBICORT) 80-4.5 MCG/ACT inhaler Inhale 2 puffs into the lungs 2 (two) times daily. 1 Inhaler 5  . cetirizine (ZYRTEC) 10 MG tablet Take 1 tablet (10 mg total) by mouth daily. 30 tablet 5  . diphenhydrAMINE (BENADRYL) 25 mg capsule Take 25 mg by mouth at bedtime as needed for sleep.    Marland Kitchen gabapentin (NEURONTIN) 300 MG capsule Take 300 mg by mouth 3 (three) times daily.    . hydrocortisone cream 1 % Apply 1 application topically 2 (two) times daily as needed for itching.     Marland Kitchen ibuprofen (ADVIL,MOTRIN) 200 MG tablet Take 200-800 mg by mouth every 6 (six) hours as needed for headache or moderate pain. Depends on pain    . levothyroxine (SYNTHROID, LEVOTHROID) 50 MCG tablet TAKE 1 TABLET EVERY DAY 90 tablet 1  . lithium carbonate 150 MG capsule Take 150 mg by mouth 3 (three) times daily with meals.    Marland Kitchen loperamide (IMODIUM A-D) 2 MG tablet Take 2 mg by mouth 4 (four) times daily as needed for diarrhea or loose stools.    . montelukast (SINGULAIR) 10 MG tablet Take 1 tablet (10 mg total) by mouth at bedtime. 30 tablet 11  . naproxen sodium (ANAPROX) 220 MG tablet Take 4 tablets (880 mg total) by mouth 2 (two) times daily as needed (headache). 60 tablet 5  . oxybutynin (DITROPAN) 5 MG tablet Take 1 tablet by mouth 3 (three) times daily.   11  . promethazine (PHENERGAN) 12.5 MG tablet     . sertraline (ZOLOFT) 100 MG tablet Take 2 tablets (200 mg total) by mouth daily. 14 tablet 0  . Spacer/Aero-Holding Chambers (AEROCHAMBER PLUS) inhaler Use as instructed 1 each 0  . ziprasidone (GEODON) 20 MG capsule Take 20 mg by mouth 2 (two) times daily with a meal.   2   No current facility-administered medications on file prior to visit.      Past Medical History:  Diagnosis Date  . Allergy    seasonal  . Arthritis   . Asthma   . Bipolar 1 disorder ([place])    . 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 Complication of anesthesia   . Depression   . GERD (gastroesophageal reflux disease)   . Headache(784.0)   . History of frequent urinary tract infections   . History of hiatal hernia   . Hypertension   . Hypothyroidism   . Interstitial cystitis   . Neuromuscular disorder ([place]) 02/08/2012   TREMORS   . PONV (postoperative nausea and vomiting)   . Rosacea   . Shortness of breath   . Sleep apnea    has cpap,   . Thyroid disease    no present treatment 03-18-14   Allergies  Allergen Reactions  . Amlodipine Swelling    Peripheral edema  . Pseudoephedrine Other (See Comments)    I fly off the walls   . Risperidone And Related Other (See Comments)    Stomach upset, insomnia, drooling, tremors, "jerks," sensitivity to touch.   . Aspirin Other (See Comments)  REACTION: upset stomach  . Bupivacaine Hcl Hives  . Codeine Itching  . Etodolac Rash  . Indocin [Indomethacin] Other (See Comments)    Muscle spasms  . Ivp Dye [Iodinated Diagnostic Agents] Other (See Comments)    Flushed and Fever, itch all over  . Pentazocine Lactate Itching    oozing  . Propoxyphene N-Acetaminophen Itching  . Propranolol Nausea Only  . Sulfonamide Derivatives Other (See Comments)    unknown  . Tramadol Hcl Other (See Comments)    unknown    Social History   Social History  . Marital status: Divorced    Spouse name: N/A  . Number of children: 0  . Years of education: N/A   Social History Main Topics  . Smoking status: Never Smoker  . Smokeless tobacco: Never Used  . Alcohol use 0.0 oz/week     Comment: once every 3-4 months  . Drug use: No  . Sexual activity: Not Currently   Other Topics Concern  . None   Social History Narrative  . None    Vitals:   10/13/16 1531  BP: 120/76  Pulse: 100  Resp: 12  SpO2: 98%   Body mass index is 32.76 kg/m.   Physical Exam  Nursing note and vitals reviewed. Constitutional: She is oriented  to person, place, and time. She appears well-developed. No distress.  HENT:  Head: Normocephalic and atraumatic.  Mouth/Throat: Oropharynx is clear and moist and mucous membranes are normal.  Eyes: Pupils are equal, round, and reactive to light. Conjunctivae are normal.  Cardiovascular: Normal rate and regular rhythm.   No murmur heard. Respiratory: Effort normal and breath sounds normal. No respiratory distress. She exhibits no tenderness.  GI: Soft. She exhibits no mass. There is no hepatomegaly. There is no tenderness.  Genitourinary:    Uterus is not enlarged and not tender. Cervix exhibits no motion tenderness, no discharge and no friability. Right adnexum displays tenderness and fullness. Right adnexum displays no mass. Left adnexum displays no mass, no tenderness and no fullness. No erythema, tenderness or bleeding in the vagina. No vaginal discharge found.  Musculoskeletal: She exhibits no edema or tenderness.  Lymphadenopathy:    She has no cervical adenopathy.       Right: No inguinal adenopathy present.       Left: No inguinal adenopathy present.  Neurological: She is alert and oriented to person, place, and time. She has normal strength. Coordination and gait normal.  Skin: Skin is warm. Abrasion and rash noted. There is erythema.  Erythema and local heat along inguinal area, bilateral, R>L. She has a few superficial excoriations on right inguinal area. Mild tenderness. Erythema extends to right pubic area. No induration or drainage, no fluctuant areas. See GU system.  Psychiatric: Her mood appears anxious.  Well groomed, good eye contact.      ASSESSMENT AND PLAN:   Ms Sandeep was seen today for gynecologic exam + other concerns.  Diagnoses and all orders for this visit:  Cervical cancer screening -     Cytology - PAP ([place])  Other migraine without status migrainosus, not intractable  Reporting headaches as stable. Still having 2-3 episodes per months. She  was supposed to follow with neuro for tremor in 6-08/2016, I do not see visit. Instructed about warning signs. Head CT 01/2012:  1.  No acute intracranial pathology seen on CT. 2.  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  We will discuss this problem in more  detail next OV.  -     rizatriptan (MAXALT-MLT) 10 MG disintegrating tablet; Take 1 tablet (10 mg total) by mouth daily as needed for migraine. May repeat in 2 hours if needed. Do not exceed 2 doses in 1 week period  Atrophic vaginitis  Stable. No changes in current management. F/U in 12 months.  -     estradiol (ESTRACE) 0.1 MG/GM vaginal cream; Place 1 Applicatorful vaginally 3 (three) times a week.  Chronic rhinitis  Well controlled. No changes in current management. F/U in 12 months.  -     fluticasone (FLONASE) 50 MCG/ACT nasal spray; Place 2 sprays into both nostrils daily.  Cellulitis, perineum  It seems like precipitating factors could be intertrigo. Because tender and local heat oral Abx was recommended. Some side effects discussed. Keeps area clean with soap and water. Also keep area uncovered for periods of time. Topical Lotrisone also recommended bid for 14 days. Instructed about warning signs and f/u as needed.  -     doxycycline (VIBRA-TABS) 100 MG tablet; Take 1 tablet (100 mg total) by mouth 2 (two) times daily.  Pelvic pain in female  Because also tenderness and fullness pelvic US recommended. Other possible etiologies dicussed.  -     US Pelvis Complete; Future -     US Transvaginal Non-OB; Future  Vitamin D deficiency, unspecified  She did not have lab done before leaving. Will bring her back for lab, no changes in current management.  -     VITAMIN D 25 Hydroxy (Vit-D Deficiency, Fractures); Future -     Cholecalciferol (VITAMIN D-3) 5000 units TABS; Take 2 tablets by mouth daily.  Need for influenza vaccination -     Flu  Vaccine QUAD 36+ mos IM  Other orders -     clotrimazole-betamethasone (LOTRISONE) cream; Apply 1 application topically 2 (two) times daily. -     simvastatin (ZOCOR) 20 MG tablet; Take 1 tablet (20 mg total) by mouth daily with supper.       Sheelah Ritacco G. [place], MD  [facility]. [place] office.

## 2016-10-19 DIAGNOSIS — F411 Generalized anxiety disorder: Secondary | ICD-10-CM | POA: Diagnosis not present

## 2016-10-19 DIAGNOSIS — R69 Illness, unspecified: Secondary | ICD-10-CM | POA: Diagnosis not present

## 2016-10-19 LAB — CYTOLOGY - PAP: Diagnosis: NEGATIVE

## 2016-10-21 ENCOUNTER — Other Ambulatory Visit: Payer: Self-pay | Admitting: Adult Health

## 2016-10-21 MED ORDER — MONTELUKAST SODIUM 10 MG PO TABS
10.0000 mg | ORAL_TABLET | Freq: Every day | ORAL | 10 refills | Status: DC
Start: 2016-10-21 — End: 2018-04-26

## 2016-10-26 ENCOUNTER — Other Ambulatory Visit (INDEPENDENT_AMBULATORY_CARE_PROVIDER_SITE_OTHER): Payer: Medicare HMO

## 2016-10-26 ENCOUNTER — Other Ambulatory Visit: Payer: Self-pay

## 2016-10-26 DIAGNOSIS — F411 Generalized anxiety disorder: Secondary | ICD-10-CM | POA: Diagnosis not present

## 2016-10-26 DIAGNOSIS — E559 Vitamin D deficiency, unspecified: Secondary | ICD-10-CM | POA: Diagnosis not present

## 2016-10-26 DIAGNOSIS — R69 Illness, unspecified: Secondary | ICD-10-CM | POA: Diagnosis not present

## 2016-10-26 LAB — VITAMIN D 25 HYDROXY (VIT D DEFICIENCY, FRACTURES): VITD: 53.65 ng/mL (ref 30.00–100.00)

## 2016-11-02 DIAGNOSIS — R69 Illness, unspecified: Secondary | ICD-10-CM | POA: Diagnosis not present

## 2016-11-08 DIAGNOSIS — R69 Illness, unspecified: Secondary | ICD-10-CM | POA: Diagnosis not present

## 2016-11-08 DIAGNOSIS — F3132 Bipolar disorder, current episode depressed, moderate: Secondary | ICD-10-CM | POA: Diagnosis not present

## 2016-11-11 DIAGNOSIS — R69 Illness, unspecified: Secondary | ICD-10-CM | POA: Diagnosis not present

## 2016-11-12 DIAGNOSIS — N3010 Interstitial cystitis (chronic) without hematuria: Secondary | ICD-10-CM | POA: Diagnosis not present

## 2016-11-17 ENCOUNTER — Ambulatory Visit
Admission: RE | Admit: 2016-11-17 | Discharge: 2016-11-17 | Disposition: A | Discharge status: Home / Self Care | Payer: Medicare HMO | Source: Ambulatory Visit | Attending: Family Medicine | Admitting: Family Medicine

## 2016-11-17 DIAGNOSIS — R102 Pelvic and perineal pain: Secondary | ICD-10-CM | POA: Diagnosis not present

## 2016-11-17 DIAGNOSIS — N888 Other specified noninflammatory disorders of cervix uteri: Secondary | ICD-10-CM | POA: Diagnosis not present

## 2016-11-26 DIAGNOSIS — F3132 Bipolar disorder, current episode depressed, moderate: Secondary | ICD-10-CM | POA: Diagnosis not present

## 2016-11-26 DIAGNOSIS — R69 Illness, unspecified: Secondary | ICD-10-CM | POA: Diagnosis not present

## 2016-12-01 DIAGNOSIS — F3132 Bipolar disorder, current episode depressed, moderate: Secondary | ICD-10-CM | POA: Diagnosis not present

## 2016-12-01 DIAGNOSIS — R69 Illness, unspecified: Secondary | ICD-10-CM | POA: Diagnosis not present

## 2016-12-06 DIAGNOSIS — L814 Other melanin hyperpigmentation: Secondary | ICD-10-CM | POA: Diagnosis not present

## 2016-12-06 DIAGNOSIS — L219 Seborrheic dermatitis, unspecified: Secondary | ICD-10-CM | POA: Diagnosis not present

## 2016-12-06 DIAGNOSIS — L821 Other seborrheic keratosis: Secondary | ICD-10-CM | POA: Diagnosis not present

## 2016-12-06 DIAGNOSIS — Z85828 Personal history of other malignant neoplasm of skin: Secondary | ICD-10-CM | POA: Diagnosis not present

## 2016-12-06 DIAGNOSIS — D1801 Hemangioma of skin and subcutaneous tissue: Secondary | ICD-10-CM | POA: Diagnosis not present

## 2016-12-06 DIAGNOSIS — Z23 Encounter for immunization: Secondary | ICD-10-CM | POA: Diagnosis not present

## 2016-12-06 DIAGNOSIS — B359 Dermatophytosis, unspecified: Secondary | ICD-10-CM | POA: Diagnosis not present

## 2016-12-06 DIAGNOSIS — L570 Actinic keratosis: Secondary | ICD-10-CM | POA: Diagnosis not present

## 2016-12-06 DIAGNOSIS — Z86018 Personal history of other benign neoplasm: Secondary | ICD-10-CM | POA: Diagnosis not present

## 2016-12-06 DIAGNOSIS — D225 Melanocytic nevi of trunk: Secondary | ICD-10-CM | POA: Diagnosis not present

## 2016-12-09 ENCOUNTER — Telehealth: Payer: Self-pay | Admitting: Family Medicine

## 2016-12-09 DIAGNOSIS — R69 Illness, unspecified: Secondary | ICD-10-CM | POA: Diagnosis not present

## 2016-12-09 NOTE — Telephone Encounter (Signed)
Pt would like a call back and go over her last labs again.  She has some questions and needs to speak with you.  Pt aware not in on thursday

## 2016-12-13 ENCOUNTER — Telehealth: Payer: Self-pay | Admitting: Family Medicine

## 2016-12-13 NOTE — Telephone Encounter (Signed)
Spoke with patient.

## 2016-12-13 NOTE — Telephone Encounter (Signed)
° ° ° ° ° ° °  Pt call to say she didn ot get a call back on Friday as she expected to. She would like to discuss her labs

## 2016-12-13 NOTE — Telephone Encounter (Signed)
Spoke with patient about previous lab results.

## 2016-12-16 DIAGNOSIS — R69 Illness, unspecified: Secondary | ICD-10-CM | POA: Diagnosis not present

## 2016-12-19 ENCOUNTER — Encounter (HOSPITAL_COMMUNITY): Payer: Self-pay | Admitting: Emergency Medicine

## 2016-12-19 ENCOUNTER — Emergency Department (HOSPITAL_COMMUNITY)
Admission: EM | Admit: 2016-12-19 | Discharge: 2016-12-19 | Disposition: A | Discharge status: Home / Self Care | Payer: Medicare HMO | Attending: Emergency Medicine | Admitting: Emergency Medicine

## 2016-12-19 DIAGNOSIS — Z791 Long term (current) use of non-steroidal anti-inflammatories (NSAID): Secondary | ICD-10-CM | POA: Insufficient documentation

## 2016-12-19 DIAGNOSIS — R69 Illness, unspecified: Secondary | ICD-10-CM | POA: Diagnosis not present

## 2016-12-19 DIAGNOSIS — Z8659 Personal history of other mental and behavioral disorders: Secondary | ICD-10-CM

## 2016-12-19 DIAGNOSIS — J45909 Unspecified asthma, uncomplicated: Secondary | ICD-10-CM | POA: Diagnosis not present

## 2016-12-19 DIAGNOSIS — I10 Essential (primary) hypertension: Secondary | ICD-10-CM | POA: Diagnosis not present

## 2016-12-19 DIAGNOSIS — E039 Hypothyroidism, unspecified: Secondary | ICD-10-CM | POA: Diagnosis not present

## 2016-12-19 DIAGNOSIS — Z79899 Other long term (current) drug therapy: Secondary | ICD-10-CM | POA: Diagnosis not present

## 2016-12-19 DIAGNOSIS — F419 Anxiety disorder, unspecified: Secondary | ICD-10-CM | POA: Insufficient documentation

## 2016-12-19 DIAGNOSIS — R269 Unspecified abnormalities of gait and mobility: Secondary | ICD-10-CM | POA: Diagnosis not present

## 2016-12-19 LAB — BASIC METABOLIC PANEL
Anion gap: 13 (ref 5–15)
BUN: 8 mg/dL (ref 6–20)
CHLORIDE: 107 mmol/L (ref 101–111)
CO2: 20 mmol/L — ABNORMAL LOW (ref 22–32)
CREATININE: 0.9 mg/dL (ref 0.44–1.00)
Calcium: 9.6 mg/dL (ref 8.9–10.3)
GFR calc Af Amer: >60 mL/min (ref >60)
GFR calc non Af Amer: >60 mL/min (ref >60)
Glucose, Bld: 116 mg/dL — ABNORMAL HIGH (ref 65–99)
Potassium: 3.9 mmol/L (ref 3.5–5.1)
SODIUM: 140 mmol/L (ref 135–145)

## 2016-12-19 LAB — CBC WITH DIFFERENTIAL/PLATELET
Basophils Absolute: 0 10*3/uL (ref 0.0–0.1)
Basophils Relative: 0 %
EOS ABS: 0 10*3/uL (ref 0.0–0.7)
EOS PCT: 0 %
HCT: 38.5 % (ref 36.0–46.0)
Hemoglobin: 13 g/dL (ref 12.0–15.0)
LYMPHS ABS: 2.5 10*3/uL (ref 0.7–4.0)
Lymphocytes Relative: 22 %
MCH: 30.2 pg (ref 26.0–34.0)
MCHC: 33.8 g/dL (ref 30.0–36.0)
MCV: 89.5 fL (ref 78.0–100.0)
Monocytes Absolute: 0.8 10*3/uL (ref 0.1–1.0)
Monocytes Relative: 7 %
Neutro Abs: 8.3 10*3/uL — ABNORMAL HIGH (ref 1.7–7.7)
Neutrophils Relative %: 71 %
Platelets: 282 10*3/uL (ref 150–400)
RBC: 4.3 MIL/uL (ref 3.87–5.11)
RDW: 13.2 % (ref 11.5–15.5)
WBC: 11.6 10*3/uL — AB (ref 4.0–10.5)

## 2016-12-19 MED ORDER — LORAZEPAM 1 MG PO TABS
1.0000 mg | ORAL_TABLET | Freq: Once | ORAL | Status: AC
  Administered 2016-12-19: 1 mg via ORAL
  Filled 2016-12-19: qty 1

## 2016-12-19 MED ORDER — LORAZEPAM 2 MG/ML IJ SOLN
1.0000 mg | Freq: Once | INTRAMUSCULAR | Status: AC
  Administered 2016-12-19: 1 mg via INTRAMUSCULAR
  Filled 2016-12-19: qty 1

## 2016-12-19 NOTE — Discharge Instructions (Addendum)
Your labs and EKG were reassuring. Please use resources to find follow up for further evaluation and treatment of your symptoms. If you develop worsening or new concerning symptoms you can return to the emergency department for re-evaluation.    [facility] [address] [phone]

## 2016-12-19 NOTE — ED Triage Notes (Signed)
Pt reports she has had panic attacks for the past 3 weeks accompanied by trouble sleeping, decreased appetite, and diarrhea. Went to PCP, but no recent medication changes. Pt denies SI/HI.

## 2016-12-19 NOTE — ED Notes (Signed)
Pt given resources to follow up with outpatient. Pt was very frustrated and stated she would not be able to get a followup until Tuesday, due to the holiday. MD informed pt that if it gets to be too much the ED is always open

## 2016-12-19 NOTE — ED Notes (Signed)
ED Provider at bedside. 

## 2016-12-19 NOTE — ED Provider Notes (Signed)
[facility] DEPT Provider Note   CSN: [id number] Arrival date & time: 12/19/16  1057     History   Chief Complaint Chief Complaint  Patient presents with  . Panic Attack    HPI [name] is a 61 y.o. female with a history of bipolar 1 disorder, hypertension, hypothyroidism and tremors who presents the emergency department today for anxiety.  Patient states that she was diagnosed with bipolar and anxiety at the age of 58.  Per the patient she has been on Lithium, Xanax and Clonazepam for her psychiatric disorders but recently been weaned off her medication to completion last month.  She states that since then she has been very anxious, nauseated and had intermittent bouts of nonbloody diarrhea.  She states she has racing thoughts, cannot focus and cannot sleep at night because of this.  She recently spent all of her paycheck in a spending spree.  Patient's only new medication has been hydroxyzine started by urology.  The patient denies fever, chills, chest pain, shortness of breath, cough, emesis, abdominal pain,, hematochezia, melena, dysuria, urinary frequency, urgency, leg swelling.  She has been taking her hypothyroidism medicine as directed.  HPI  Past Medical History:  Diagnosis Date  . Allergy    seasonal  . Arthritis   . Asthma   . Bipolar 1 disorder ([place])   . 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 Complication of anesthesia   . Depression   . GERD (gastroesophageal reflux disease)   . Headache(784.0)   . History of frequent urinary tract infections   . History of hiatal hernia   . Hypertension   . Hypothyroidism   . Interstitial cystitis   . Neuromuscular disorder ([place]) 02/08/2012   TREMORS   . PONV (postoperative nausea and vomiting)   . Rosacea   . Shortness of breath   . Sleep apnea    has cpap,   . Thyroid disease    no present treatment 03-18-14    Patient Active Problem List   Diagnosis Date Noted  . Vitamin D  deficiency, unspecified 10/13/2016  . Hyperlipidemia 09/07/2016  . Osteoporosis without current pathological fracture 03/22/2016  . Paraesophageal hiatal hernia s/p robotic repair & fundoplication 7/61/9509 32/67/1245  . Bipolar 1 disorder, mixed, moderate ([place]) 01/29/2016  . Class 1 obesity with body mass index (BMI) of 32.0 to 32.9 in adult 01/06/2016  . History of skin cancer 03/10/2015  . Bilateral knee pain 12/06/2014  . Migraines 11/21/2014  . Increased frequency of urination 05/30/2013  . Tremor 02/08/2012  . OTH D/O MENSTRUATION&OTH ABN BLEED FE GNT TRACT 12/01/2007  . PELVIC PAIN, HX OF 12/01/2007  . Dyspareunia, female 03/24/2007  . OSA on CPAP 01/30/2007  . GERD 11/02/2006  . DERMATITIS, HANDS 11/02/2006  . Hypothyroidism 10/07/2006  . Essential hypertension 10/07/2006  . RHINITIS, CHRONIC 10/07/2006  . Asthma 10/07/2006  . Irritable bowel syndrome 10/07/2006  . INTERSTITIAL CYSTITIS 10/07/2006  . ROSACEA 10/07/2006  . Osteoarthritis of both knees 10/07/2006  . Disturbance in sleep behavior 10/07/2006  . Personal history of other malignant neoplasm of skin 10/07/2006    Past Surgical History:  Procedure Laterality Date  . arthritic finger stabilizer for arthritis    . BREAST LUMPECTOMY    . BUNIONECTOMY    . CARPAL TUNNEL RELEASE Bilateral   . CHOLECYSTECTOMY    . COLONOSCOPY    . CYSTOSCOPY    . DILATION AND CURETTAGE OF UTERUS    .  early stage melanoma back    . ENDOMETRIAL ABLATION    . ingrown toenail surgery    . KNEE SURGERY     multiple knee arthroscopy  . NASAL SEPTUM SURGERY    . nerve damage and repair right thand    . rectal fissure    . squamous cell carcinoma left ear    . TMJ ARTHROPLASTY     multiple  . TONSILLECTOMY    . TRIGGER FINGER RELEASE     x2 with repairs   . ULNAR NERVE TRANSPOSITION    . UPPER GASTROINTESTINAL ENDOSCOPY  12-17-2004  . WISDOM TOOTH EXTRACTION      OB History    Gravida Para Term Preterm AB Living   0              SAB TAB Ectopic Multiple Live Births                   Home Medications    Prior to Admission medications   Medication Sig Start Date End Date Taking? Authorizing Provider  albuterol (PROVENTIL HFA;VENTOLIN HFA) 108 (90 Base) MCG/ACT inhaler Inhale 1-2 puffs into the lungs daily as needed for wheezing or shortness of breath. Patient taking differently: Inhale 2 puffs into the lungs every 6 (six) hours as needed for wheezing or shortness of breath.  08/05/15   Funches, Adriana Mccallum, MD  alendronate (FOSAMAX) 70 MG tablet Take 1 tablet (70 mg total) by mouth every 7 (seven) days. Take with a full glass of water on an empty stomach. 03/22/16   Martin[place], Betty G, MD  amitriptyline (ELAVIL) 25 MG tablet Take 25 mg by mouth at bedtime.    [provider]  anti-nausea (EMETROL) solution Take 30 mLs by mouth every 15 (fifteen) minutes as needed for nausea or vomiting.    [provider]  azelastine (ASTELIN) 0.1 % nasal spray PLACE 2 SPRAYS IN BOTH NOSTRILS TWICE DAILY 07/01/16   [place], Betty G, MD  budesonide-formoterol [facility]) 80-4.5 MCG/ACT inhaler Inhale 2 puffs into the lungs 2 (two) times daily. 06/29/16   de Dios, [place] A, MD  cetirizine (ZYRTEC) 10 MG tablet Take 1 tablet (10 mg total) by mouth daily. 11/21/14   Funches, Adriana Mccallum, MD  Cholecalciferol (VITAMIN D-3) 5000 units TABS Take 2 tablets by mouth daily. 10/13/16   [place], Betty G, MD  diphenhydrAMINE (BENADRYL) 25 mg capsule Take 25 mg by mouth at bedtime as needed for sleep.    [provider]  estradiol (ESTRACE) 0.1 MG/GM vaginal cream Place 1 Applicatorful vaginally 3 (three) times a week. 10/13/16   [place], Betty G, MD  fluticasone (FLONASE) 50 MCG/ACT nasal spray Place 2 sprays into both nostrils daily. 10/13/16   [place], Betty G, MD  gabapentin (NEURONTIN) 300 MG capsule Take 300 mg by mouth 3 (three) times daily.    [provider]  hydrocortisone cream 1 % Apply 1 application topically 2  (two) times daily as needed for itching.     [provider]  ibuprofen (ADVIL,MOTRIN) 200 MG tablet Take 200-800 mg by mouth every 6 (six) hours as needed for headache or moderate pain. Depends on pain    [provider]  levothyroxine (SYNTHROID, LEVOTHROID) 50 MCG tablet TAKE 1 TABLET EVERY DAY 08/06/16   [place], Betty G, MD  lithium carbonate 150 MG capsule Take 150 mg by mouth 3 (three) times daily with meals.    [provider]  loperamide (IMODIUM A-D) 2 MG tablet Take 2  mg by mouth 4 (four) times daily as needed for diarrhea or loose stools.    [provider]  montelukast (SINGULAIR) 10 MG tablet Take 1 tablet (10 mg total) by mouth at bedtime. 10/21/16   Parrett, Fonnie Mu, NP  naproxen sodium (ANAPROX) 220 MG tablet Take 4 tablets (880 mg total) by mouth 2 (two) times daily as needed (headache). 11/21/14   Funches, Adriana Mccallum, MD  oxybutynin (DITROPAN) 5 MG tablet Take 1 tablet by mouth 3 (three) times daily.  05/13/15   [provider]  promethazine (PHENERGAN) 12.5 MG tablet  04/13/16   [provider]  rizatriptan (MAXALT-MLT) 10 MG disintegrating tablet Take 1 tablet (10 mg total) by mouth daily as needed for migraine. May repeat in 2 hours if needed. Do not exceed 2 doses in 1 week period 10/13/16   [place], Betty G, MD  sertraline (ZOLOFT) 100 MG tablet Take 2 tablets (200 mg total) by mouth daily. 12/12/12   Rankin, Shuvon B, NP  simvastatin (ZOCOR) 20 MG tablet Take 1 tablet (20 mg total) by mouth daily with supper. 10/13/16   [place], Betty G, MD  Spacer/Aero-Holding Chambers (AEROCHAMBER PLUS) inhaler Use as instructed 01/06/16   de Bonham, [place], MD  ziprasidone (GEODON) 20 MG capsule Take 20 mg by mouth 2 (two) times daily with a meal.  01/09/15   [provider]    Family History Family History  Problem Relation Age of Onset  . Hypertension Mother        deceased from MVA complications  . Thyroid disease Mother   .  Testicular cancer Father   . Colon polyps Sister   . Coronary artery disease Unknown     Social History Social History   Tobacco Use  . Smoking status: Never Smoker  . Smokeless tobacco: Never Used  Substance Use Topics  . Alcohol use: Yes    Alcohol/week: 0.0 oz    Comment: once every 3-4 months  . Drug use: No     Allergies   Amlodipine; Pseudoephedrine; Risperidone and related; Aspirin; Bupivacaine hcl; Codeine; Etodolac; Indocin [indomethacin]; Ivp dye [iodinated diagnostic agents]; Pentazocine lactate; Propoxyphene n-acetaminophen; Propranolol; Sulfonamide derivatives; and Tramadol hcl   Review of Systems Review of Systems  All other systems reviewed and are negative.    Physical Exam Updated Vital Signs BP (!) 159/97 (BP Location: Left Arm)   Pulse 98   Temp 98.2 F (36.8 C) (Oral)   Resp 18   Ht 5' 0.5" (1.537 m)   Wt 74.8 kg (165 lb)   SpO2 100%   BMI 31.69 kg/m   Physical Exam  Constitutional: She appears well-developed and well-nourished.  HENT:  Head: Normocephalic and atraumatic.  Right Ear: External ear normal.  Left Ear: External ear normal.  Nose: Nose normal.  Mouth/Throat: Uvula is midline, oropharynx is clear and moist and mucous membranes are normal. No tonsillar exudate.  Eyes: Pupils are equal, round, and reactive to light. Right eye exhibits no discharge. Left eye exhibits no discharge. No scleral icterus.  No proptosis  Neck: Trachea normal, normal range of motion and full passive range of motion without pain. Neck supple. No JVD present. No spinous process tenderness present. Carotid bruit is not present. No neck rigidity. Normal range of motion present.  No goiter  Cardiovascular: Normal rate, regular rhythm and intact distal pulses.  No murmur heard. Pulses:      Radial pulses are 2+ on the right side, and 2+ on the left  side.       Dorsalis pedis pulses are 2+ on the right side, and 2+ on the left side.       Posterior tibial  pulses are 2+ on the right side, and 2+ on the left side.  No lower extremity swelling or edema. Calves symmetric in size bilaterally.  Pulmonary/Chest: Effort normal and breath sounds normal. She exhibits no tenderness.  Abdominal: Soft. Bowel sounds are normal. There is no tenderness. There is no rebound and no guarding.  Musculoskeletal: She exhibits no edema.  Lymphadenopathy:    She has no cervical adenopathy.  Neurological: She is alert.  Speech clear. Follows commands. No facial droop. PERRLA. EOM grossly intact. CN III-XII grossly intact. Grossly moves all extremities 4 without ataxia. Able and appropriate strength for age to upper and lower extremities bilaterally including grip strength. Normal gait.   Skin: Skin is warm and dry. No rash noted. She is not diaphoretic.  Psychiatric: She has a normal mood and affect.  Nursing note and vitals reviewed.    ED Treatments / Results  Labs (all labs ordered are listed, but only abnormal results are displayed) Labs Reviewed  BASIC METABOLIC PANEL - Abnormal; Notable for the following components:      Result Value   CO2 20 (*)    Glucose, Bld 116 (*)    All other components within normal limits  CBC WITH DIFFERENTIAL/PLATELET - Abnormal; Notable for the following components:   WBC 11.6 (*)    Neutro Abs 8.3 (*)    All other components within normal limits    EKG  EKG Interpretation None       Radiology No results found.  Procedures Procedures (including critical care time)  Medications Ordered in ED Medications  LORazepam (ATIVAN) tablet 1 mg (1 mg Oral Given 12/19/16 1240)  LORazepam (ATIVAN) injection 1 mg (1 mg Intramuscular Given 12/19/16 1450)     Initial Impression / Assessment and Plan / ED Course  I have reviewed the triage vital signs and the nursing notes.  Pertinent labs & imaging results that were available during my care of the patient were reviewed by me and considered in my medical decision making  (see chart for details).     Patient presents to the emergency department complaining of symptoms consistent with anxiety and bipolar episode.  Patient has a history of same with similar episodes.  Labs, ECG and vital signs reviewed.  No exophthalmos.  Stress reducing mechanisms discussed including caffeine intake.  Patient has been referred to psychiatric services for follow-up. Patient states understanding and appears safe for discharge. Patient case discussed with Dr. Venora Maples who is in agreement with plan.   Final Clinical Impressions(s) / ED Diagnoses   Final diagnoses:  Anxiety  History of bipolar disorder    ED Discharge Orders    None       Lorelle Gibbs 12/19/16 1523    Jola Schmidt, MD 12/19/16 1553

## 2016-12-20 ENCOUNTER — Other Ambulatory Visit: Payer: Self-pay | Admitting: Family Medicine

## 2016-12-20 DIAGNOSIS — E559 Vitamin D deficiency, unspecified: Secondary | ICD-10-CM

## 2016-12-21 DIAGNOSIS — R69 Illness, unspecified: Secondary | ICD-10-CM | POA: Diagnosis not present

## 2016-12-21 NOTE — Progress Notes (Deleted)
HPI:   [name] is a 61 y.o. female, who is here today to follow on recent ER.   She was evaluated in the ER on 12/19/16 because worsening anxiety. Hx of bipolar disorder I and anxiety. She follows with psychiatrist, ***  Lab Results  Component Value Date   WBC 11.6 (H) 12/19/2016   HGB 13.0 12/19/2016   HCT 38.5 12/19/2016   MCV 89.5 12/19/2016   PLT 282 12/19/2016         Review of Systems    Current Outpatient Medications on File Prior to Visit  Medication Sig Dispense Refill  . albuterol (PROVENTIL HFA;VENTOLIN HFA) 108 (90 Base) MCG/ACT inhaler Inhale 1-2 puffs into the lungs daily as needed for wheezing or shortness of breath. (Patient taking differently: Inhale 2 puffs into the lungs every 6 (six) hours as needed for wheezing or shortness of breath. ) 54 Inhaler 3  . alendronate (FOSAMAX) 70 MG tablet Take 1 tablet (70 mg total) by mouth every 7 (seven) days. Take with a full glass of water on an empty stomach. 13 tablet 2  . anti-nausea (EMETROL) solution Take 30 mLs by mouth every 15 (fifteen) minutes as needed for nausea or vomiting.    Marland Kitchen azelastine (ASTELIN) 0.1 % nasal spray PLACE 2 SPRAYS IN BOTH NOSTRILS TWICE DAILY 30 mL 3  . budesonide-formoterol (SYMBICORT) 80-4.5 MCG/ACT inhaler Inhale 2 puffs into the lungs 2 (two) times daily. (Patient taking differently: Inhale 1 puff 2 (two) times daily into the lungs. ) 1 Inhaler 5  . cetirizine (ZYRTEC) 10 MG tablet Take 1 tablet (10 mg total) by mouth daily. (Patient not taking: Reported on 12/19/2016) 30 tablet 5  . [facility] D3 5000 units capsule TAKE 2 TABLETS BY MOUTH EVERY DAY 60 capsule 1  . diphenhydrAMINE (BENADRYL) 25 mg capsule Take 25 mg by mouth at bedtime as needed for sleep.    Marland Kitchen estradiol (ESTRACE) 0.1 MG/GM vaginal cream Place 1 Applicatorful vaginally 3 (three) times a week. (Patient taking differently: Place 1 Applicatorful once a week vaginally. ) 42.5 g 6  . fluticasone (FLONASE) 50  MCG/ACT nasal spray Place 2 sprays into both nostrils daily. 16 g 10  . gabapentin (NEURONTIN) 300 MG capsule Take 300 mg by mouth 3 (three) times daily.    . hydrocortisone cream 1 % Apply 1 application topically 2 (two) times daily as needed for itching.     . hydrOXYzine (ATARAX/VISTARIL) 25 MG tablet Take 25 mg 2 (two) times daily by mouth.  0  . ibuprofen (ADVIL,MOTRIN) 200 MG tablet Take 200-800 mg by mouth every 6 (six) hours as needed for headache or moderate pain. Depends on pain    . levothyroxine (SYNTHROID, LEVOTHROID) 50 MCG tablet TAKE 1 TABLET EVERY DAY 90 tablet 1  . loperamide (IMODIUM A-D) 2 MG tablet Take 2 mg by mouth 4 (four) times daily as needed for diarrhea or loose stools.    . montelukast (SINGULAIR) 10 MG tablet Take 1 tablet (10 mg total) by mouth at bedtime. 30 tablet 10  . naproxen sodium (ANAPROX) 220 MG tablet Take 4 tablets (880 mg total) by mouth 2 (two) times daily as needed (headache). 60 tablet 5  . promethazine (PHENERGAN) 12.5 MG tablet Take 12.5 mg every 6 (six) hours as needed by mouth for nausea or vomiting.     . rizatriptan (MAXALT-MLT) 10 MG disintegrating tablet Take 1 tablet (10 mg total) by mouth daily as needed for migraine. May  repeat in 2 hours if needed. Do not exceed 2 doses in 1 week period 9 tablet 3  . sertraline (ZOLOFT) 100 MG tablet Take 2 tablets (200 mg total) by mouth daily. (Patient taking differently: Take 100 mg daily by mouth. ) 14 tablet 0  . simvastatin (ZOCOR) 20 MG tablet Take 1 tablet (20 mg total) by mouth daily with supper. 30 tablet 3  . Spacer/Aero-Holding Chambers (AEROCHAMBER PLUS) inhaler Use as instructed 1 each 0  . ziprasidone (GEODON) 20 MG capsule Take 20-40 mg 2 (two) times daily with a meal by mouth. 20 mg at noon and 40 mg in the evening  2   No current facility-administered medications on file prior to visit.      Past Medical History:  Diagnosis Date  . Allergy    seasonal  . Arthritis   . Asthma   .  Bipolar 1 disorder ([place])   . 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 Complication of anesthesia   . Depression   . GERD (gastroesophageal reflux disease)   . Headache(784.0)   . History of frequent urinary tract infections   . History of hiatal hernia   . Hypertension   . Hypothyroidism   . Interstitial cystitis   . Neuromuscular disorder ([place]) 02/08/2012   TREMORS   . PONV (postoperative nausea and vomiting)   . Rosacea   . Shortness of breath   . Sleep apnea    has cpap,   . Thyroid disease    no present treatment 03-18-14   Allergies  Allergen Reactions  . Amlodipine Swelling    Peripheral edema  . Pseudoephedrine Other (See Comments)    I fly off the walls   . Risperidone And Related Other (See Comments)    Stomach upset, insomnia, drooling, tremors, "jerks," sensitivity to touch.   . Aspirin Other (See Comments)    REACTION: upset stomach  . Bupivacaine Hcl Hives  . Codeine Itching  . Etodolac Rash  . Indocin [Indomethacin] Other (See Comments)    Muscle spasms  . Ivp Dye [Iodinated Diagnostic Agents] Other (See Comments)    Flushed and Fever, itch all over  . Pentazocine Lactate Itching    oozing  . Propoxyphene N-Acetaminophen Itching  . Propranolol Nausea Only  . Sulfonamide Derivatives Other (See Comments)    unknown  . Tramadol Hcl Other (See Comments)    unknown    Social History   Socioeconomic History  . Marital status: Divorced    Spouse name: Not on file  . Number of children: 0  . Years of education: Not on file  . Highest education level: Not on file  Social Needs  . Financial resource strain: Not on file  . Food insecurity - worry: Not on file  . Food insecurity - inability: Not on file  . Transportation needs - medical: Not on file  . Transportation needs - non-medical: Not on file  Occupational History  . Not on file  Tobacco Use  . Smoking status: Never Smoker  . Smokeless tobacco: Never Used  Substance and Sexual Activity  .  Alcohol use: Yes    Alcohol/week: 0.0 oz    Comment: once every 3-4 months  . Drug use: No  . Sexual activity: Not Currently  Other Topics Concern  . Not on file  Social History Narrative  . Not on file    There were no vitals filed for this visit. There is no height or  weight on file to calculate BMI.      Physical Exam    ASSESSMENT AND PLAN:     There are no diagnoses linked to this encounter.             Flara Storti G. Martinique, MD  [facility]. [place] office.

## 2016-12-22 ENCOUNTER — Ambulatory Visit: Payer: Self-pay | Admitting: Family Medicine

## 2016-12-31 DIAGNOSIS — G4733 Obstructive sleep apnea (adult) (pediatric): Secondary | ICD-10-CM | POA: Diagnosis not present

## 2017-01-04 DIAGNOSIS — R69 Illness, unspecified: Secondary | ICD-10-CM | POA: Diagnosis not present

## 2017-01-07 ENCOUNTER — Encounter: Payer: Self-pay | Admitting: Family Medicine

## 2017-01-10 ENCOUNTER — Telehealth: Payer: Self-pay | Admitting: Family Medicine

## 2017-01-10 NOTE — Telephone Encounter (Signed)
Copied from [place] [id number]. Topic: Quick Communication - See Telephone Encounter >> Jan 10, 2017  9:52 AM Bea Graff, NT wrote: CRM for notification. See Telephone encounter for: Medication Management from [facility] calling to do a medication review for this pt. CB#: [phone]   01/10/17.

## 2017-01-11 DIAGNOSIS — R69 Illness, unspecified: Secondary | ICD-10-CM | POA: Diagnosis not present

## 2017-01-13 DIAGNOSIS — R69 Illness, unspecified: Secondary | ICD-10-CM | POA: Diagnosis not present

## 2017-01-13 NOTE — Telephone Encounter (Signed)
Remo Lipps with [place] [employer] called - to do a review of medication reconciliation. Please call [phone] Thank you

## 2017-01-17 NOTE — Telephone Encounter (Signed)
Med rec completed w/ Winter, pharmacy tech and Kevan Ny, pharmacist at [facility].

## 2017-01-19 ENCOUNTER — Other Ambulatory Visit: Payer: Self-pay | Admitting: Family Medicine

## 2017-01-19 DIAGNOSIS — J310 Chronic rhinitis: Secondary | ICD-10-CM

## 2017-01-21 ENCOUNTER — Encounter (HOSPITAL_COMMUNITY): Payer: Self-pay

## 2017-01-21 DIAGNOSIS — R69 Illness, unspecified: Secondary | ICD-10-CM | POA: Diagnosis not present

## 2017-01-24 ENCOUNTER — Other Ambulatory Visit: Payer: Self-pay | Admitting: Family Medicine

## 2017-01-31 ENCOUNTER — Other Ambulatory Visit: Payer: Self-pay | Admitting: Family Medicine

## 2017-01-31 DIAGNOSIS — E039 Hypothyroidism, unspecified: Secondary | ICD-10-CM

## 2017-02-02 ENCOUNTER — Ambulatory Visit: Payer: Self-pay | Admitting: *Deleted

## 2017-02-02 NOTE — Telephone Encounter (Signed)
Pt took simvastatin for several months ; pt not sure of when she started taking and started having aches and pains and stopped taking it 2 weeks ago (12/02/10/16); she would like to have another medication prescribed; approximately 3 days after stopping medication her pain resolved;  the pt can be contacted at [phone]; will route to [facility] for further disposiiton.

## 2017-02-02 NOTE — Telephone Encounter (Signed)
  Reason for Disposition . Caller has medication question only, adult not sick, and triager answers question  Answer Assessment - Initial Assessment Questions 1. SYMPTOMS: "Do you have any symptoms?"     Aches and pains in legs, arms, buttocks 2. SEVERITY: If symptoms are present, ask "Are they mild, moderate or severe?"     severe  Protocols used: MEDICATION QUESTION CALL-A-AH

## 2017-02-04 NOTE — Telephone Encounter (Signed)
Crestor 20 mg is another option. Thanks, BJ

## 2017-02-04 NOTE — Telephone Encounter (Signed)
Please advise. Patient would like to change medication.

## 2017-02-07 ENCOUNTER — Other Ambulatory Visit: Payer: Self-pay | Admitting: *Deleted

## 2017-02-07 MED ORDER — ROSUVASTATIN CALCIUM 20 MG PO TABS: 20.0000 mg | ORAL_TABLET | Freq: Every day | ORAL | 1 refills | Status: DC

## 2017-02-07 NOTE — Telephone Encounter (Signed)
It is Ok to sent prescription for Crestor to her pharmacy. Please update med list. Thanks, BJ

## 2017-02-07 NOTE — Telephone Encounter (Signed)
Spoke with patient, patient agreed to take Crestor, wants to know if generic would be okay for her to take due to cost.

## 2017-02-07 NOTE — Telephone Encounter (Signed)
Rx sent to pharmacy, medication list updated.

## 2017-02-15 DIAGNOSIS — R69 Illness, unspecified: Secondary | ICD-10-CM | POA: Diagnosis not present

## 2017-02-15 DIAGNOSIS — F423 Hoarding disorder: Secondary | ICD-10-CM | POA: Diagnosis not present

## 2017-02-16 ENCOUNTER — Other Ambulatory Visit: Payer: Self-pay | Admitting: Family Medicine

## 2017-02-16 DIAGNOSIS — E559 Vitamin D deficiency, unspecified: Secondary | ICD-10-CM

## 2017-02-22 DIAGNOSIS — R69 Illness, unspecified: Secondary | ICD-10-CM | POA: Diagnosis not present

## 2017-02-22 DIAGNOSIS — F423 Hoarding disorder: Secondary | ICD-10-CM | POA: Diagnosis not present

## 2017-02-25 ENCOUNTER — Telehealth: Payer: Self-pay | Admitting: Adult Health

## 2017-02-25 NOTE — Telephone Encounter (Signed)
Spoke with pt. She has an upcoming appointment with TP on 06/13/17. States that she does not need CPAP supplies at this time. Advised her that since it has not been a year since she was seen, if she needs supplies between now and her upcoming appointment we would be happy to take care of that for her. Apologize for any confusion. Nothing further was needed.

## 2017-02-25 NOTE — Telephone Encounter (Signed)
[facility] from [facility] for [facility]AP supplies. Patient was last seen on 06/25/16 by Dr. Murlean Iba. Called patient to let them know we needed to make them an appointment to be seen for this. Unable to reach patient left message for them to give [place] a call back.

## 2017-03-01 DIAGNOSIS — R69 Illness, unspecified: Secondary | ICD-10-CM | POA: Diagnosis not present

## 2017-03-01 DIAGNOSIS — F423 Hoarding disorder: Secondary | ICD-10-CM | POA: Diagnosis not present

## 2017-03-08 DIAGNOSIS — F423 Hoarding disorder: Secondary | ICD-10-CM | POA: Diagnosis not present

## 2017-03-08 DIAGNOSIS — R69 Illness, unspecified: Secondary | ICD-10-CM | POA: Diagnosis not present

## 2017-03-10 DIAGNOSIS — R69 Illness, unspecified: Secondary | ICD-10-CM | POA: Diagnosis not present

## 2017-03-15 DIAGNOSIS — F411 Generalized anxiety disorder: Secondary | ICD-10-CM | POA: Diagnosis not present

## 2017-03-15 DIAGNOSIS — R69 Illness, unspecified: Secondary | ICD-10-CM | POA: Diagnosis not present

## 2017-03-24 DIAGNOSIS — F411 Generalized anxiety disorder: Secondary | ICD-10-CM | POA: Diagnosis not present

## 2017-03-24 DIAGNOSIS — R69 Illness, unspecified: Secondary | ICD-10-CM | POA: Diagnosis not present

## 2017-03-30 ENCOUNTER — Ambulatory Visit: Payer: Self-pay | Admitting: Family Medicine

## 2017-03-30 DIAGNOSIS — F411 Generalized anxiety disorder: Secondary | ICD-10-CM | POA: Diagnosis not present

## 2017-03-30 DIAGNOSIS — R69 Illness, unspecified: Secondary | ICD-10-CM | POA: Diagnosis not present

## 2017-04-03 NOTE — Progress Notes (Signed)
ACUTE VISIT   HPI:  Chief Complaint  Patient presents with  . Facial Pain    wants referral to [name] for psychology, go over previous labs    [name] is a 62 y.o. female, who is here today complaining of "sinus pain." For a while, she has had constant frontal pressure headache and facial pain, it seems to be worse with weather changes.  States that this problem has been "perpetual" and getting worse for the past 2 weeks. Today she feels slightly better. She denies fever or worsening fatigue.  Allergic rhinitis: Currently she is on Singulair 10 mg daily, Flonase nasal spray daily, Zyrtec 10 mg daily, and Astelin nasal spray daily. In the past she used to receive allergy shots. No sick contacts or recent travel.  Hx of bipolar disorder. She would like to change to another provider.  She is requesting referral to psychiatrist. According to pt,she needs a referral in order to see [name] and/or Dr Mayme Genta She is currently on Geodon 20 mg daily, Zoloft 100 mg daily, hydroxyzine 25 mg twice daily, Gabapentin 300 mg 3 times daily. Symptoms are otherwise stable, no suicidal thoughts.   She also would like to go through lipid panel results as well as liver test done in 09/2016.  Hyperlipidemia:  Currently on Crestor 20 mg daily. She did not tolerate Simvastatin well, caused myalgias. She has been on Crestor sine 02/07/17 and so far she has not had side effects.  Following a low fat diet: Yes.   Lab Results  Component Value Date   CHOL 244 (H) 09/14/2016   HDL 59.90 09/14/2016   LDLCALC 159 (H) 03/11/2016   LDLDIRECT 143.0 09/14/2016   TRIG 222.0 (H) 09/14/2016   CHOLHDL 4 09/14/2016   Lab Results  Component Value Date   ALT 11 09/14/2016   AST 13 09/14/2016   ALKPHOS 128 (H) 09/14/2016   BILITOT 0.6 09/14/2016      Review of Systems  Constitutional: Positive for fatigue. Negative for activity change, appetite change and fever.    HENT: Positive for congestion, postnasal drip, rhinorrhea and sinus pain. Negative for ear pain, mouth sores, sore throat, trouble swallowing and voice change.   Eyes: Positive for discharge and itching. Negative for redness.  Respiratory: Negative for cough, shortness of breath and wheezing.   Gastrointestinal: Negative for abdominal pain, diarrhea, nausea and vomiting.  Musculoskeletal: Positive for arthralgias. Negative for joint swelling and myalgias.  Skin: Negative for rash.  Allergic/Immunologic: Positive for environmental allergies.  Neurological: Positive for headaches. Negative for syncope and weakness.  Hematological: Negative for adenopathy. Does not bruise/bleed easily.  Psychiatric/Behavioral: Negative for confusion. The patient is nervous/anxious.       Current Outpatient Medications on File Prior to Visit  Medication Sig Dispense Refill  . albuterol (PROVENTIL HFA;VENTOLIN HFA) 108 (90 Base) MCG/ACT inhaler Inhale 1-2 puffs into the lungs daily as needed for wheezing or shortness of breath. (Patient taking differently: Inhale 2 puffs into the lungs every 6 (six) hours as needed for wheezing or shortness of breath. ) 54 Inhaler 3  . alendronate (FOSAMAX) 70 MG tablet Take 1 tablet (70 mg total) by mouth every 7 (seven) days. Take with a full glass of water on an empty stomach. 13 tablet 2  . anti-nausea (EMETROL) solution Take 30 mLs by mouth every 15 (fifteen) minutes as needed for nausea or vomiting.    . Azelastine HCl 0.15 % SOLN PLACE 2 SPRAYS  IN BOTH NOSTRILS TWICE DAILY 30 mL 3  . budesonide-formoterol (SYMBICORT) 80-4.5 MCG/ACT inhaler Inhale 2 puffs into the lungs 2 (two) times daily. (Patient taking differently: Inhale 1 puff 2 (two) times daily into the lungs. ) 1 Inhaler 5  . cetirizine (ZYRTEC) 10 MG tablet Take 1 tablet (10 mg total) by mouth daily. 30 tablet 5  . [facility] D3 5000 units capsule TAKE 2 TABLETS BY MOUTH EVERY DAY 60 capsule 1  . diphenhydrAMINE  (BENADRYL) 25 mg capsule Take 25 mg by mouth at bedtime as needed for sleep.    Marland Kitchen estradiol (ESTRACE) 0.1 MG/GM vaginal cream Place 1 Applicatorful vaginally 3 (three) times a week. (Patient taking differently: Place 1 Applicatorful once a week vaginally. ) 42.5 g 6  . fluticasone (FLONASE) 50 MCG/ACT nasal spray Place 2 sprays into both nostrils daily. 16 g 10  . gabapentin (NEURONTIN) 300 MG capsule Take 300 mg by mouth 3 (three) times daily.    . hydrocortisone cream 1 % Apply 1 application topically 2 (two) times daily as needed for itching.     . hydrOXYzine (ATARAX/VISTARIL) 25 MG tablet Take 25 mg 2 (two) times daily by mouth.  0  . ibuprofen (ADVIL,MOTRIN) 200 MG tablet Take 200-800 mg by mouth every 6 (six) hours as needed for headache or moderate pain. Depends on pain    . levothyroxine (SYNTHROID, LEVOTHROID) 50 MCG tablet TAKE 1 TABLET EVERY DAY 90 tablet 1  . loperamide (IMODIUM A-D) 2 MG tablet Take 2 mg by mouth 4 (four) times daily as needed for diarrhea or loose stools.    . montelukast (SINGULAIR) 10 MG tablet Take 1 tablet (10 mg total) by mouth at bedtime. 30 tablet 10  . naproxen sodium (ANAPROX) 220 MG tablet Take 4 tablets (880 mg total) by mouth 2 (two) times daily as needed (headache). 60 tablet 5  . promethazine (PHENERGAN) 12.5 MG tablet Take 12.5 mg every 6 (six) hours as needed by mouth for nausea or vomiting.     . rizatriptan (MAXALT-MLT) 10 MG disintegrating tablet Take 1 tablet (10 mg total) by mouth daily as needed for migraine. May repeat in 2 hours if needed. Do not exceed 2 doses in 1 week period 9 tablet 3  . rosuvastatin (CRESTOR) 20 MG tablet Take 1 tablet (20 mg total) by mouth daily. 90 tablet 1  . sertraline (ZOLOFT) 100 MG tablet Take 2 tablets (200 mg total) by mouth daily. (Patient taking differently: Take 100 mg daily by mouth. ) 14 tablet 0  . Spacer/Aero-Holding Chambers (AEROCHAMBER PLUS) inhaler Use as instructed 1 each 0  . ziprasidone (GEODON) 20  MG capsule Take 20-40 mg 2 (two) times daily with a meal by mouth. 20 mg at noon and 40 mg in the evening  2   No current facility-administered medications on file prior to visit.      Past Medical History:  Diagnosis Date  . Allergy    seasonal  . Arthritis   . Asthma   . Bipolar 1 disorder ([place])   . 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 Complication of anesthesia   . Depression   . GERD (gastroesophageal reflux disease)   . Headache(784.0)   . History of frequent urinary tract infections   . History of hiatal hernia   . Hypertension   . Hypothyroidism   . Interstitial cystitis   . Neuromuscular disorder ([place]) 02/08/2012   TREMORS   . PONV (postoperative nausea  and vomiting)   . Rosacea   . Shortness of breath   . Sleep apnea    has cpap,   . Thyroid disease    no present treatment 03-18-14   Allergies  Allergen Reactions  . Amlodipine Swelling    Peripheral edema  . Pseudoephedrine Other (See Comments)    I fly off the walls   . Risperidone And Related Other (See Comments)    Stomach upset, insomnia, drooling, tremors, "jerks," sensitivity to touch.   . Aspirin Other (See Comments)    REACTION: upset stomach  . Bupivacaine Hcl Hives  . Codeine Itching  . Etodolac Rash  . Indocin [Indomethacin] Other (See Comments)    Muscle spasms  . Ivp Dye [Iodinated Diagnostic Agents] Other (See Comments)    Flushed and Fever, itch all over  . Pentazocine Lactate Itching    oozing  . Propoxyphene N-Acetaminophen Itching  . Propranolol Nausea Only  . Sulfonamide Derivatives Other (See Comments)    unknown  . Tramadol Hcl Other (See Comments)    unknown    Social History   Socioeconomic History  . Marital status: Divorced    Spouse name: None  . Number of children: 0  . Years of education: None  . Highest education level: None  Social Needs  . Financial resource strain: None  . Food insecurity - worry: None  . Food insecurity - inability: None  .  Transportation needs - medical: None  . Transportation needs - non-medical: None  Occupational History  . None  Tobacco Use  . Smoking status: Never Smoker  . Smokeless tobacco: Never Used  Substance and Sexual Activity  . Alcohol use: Yes    Alcohol/week: 0.0 oz    Comment: once every 3-4 months  . Drug use: No  . Sexual activity: Not Currently  Other Topics Concern  . None  Social History Narrative  . None    Vitals:   04/04/17 1242  BP: 130/84  Pulse: (!) 106  Resp: 12  Temp: 98.2 F (36.8 C)  SpO2: 99%   Body mass index is 33.25 kg/m.   Physical Exam  Nursing note and vitals reviewed. Constitutional: She is oriented to person, place, and time. She appears well-developed. She does not appear ill. No distress.  HENT:  Head: Normocephalic and atraumatic.  Nose: Rhinorrhea present. Right sinus exhibits maxillary sinus tenderness. Right sinus exhibits no frontal sinus tenderness. Left sinus exhibits maxillary sinus tenderness. Left sinus exhibits no frontal sinus tenderness.  Mouth/Throat: Uvula is midline and oropharynx is clear and moist. Mucous membranes are dry.  Hypertrophic turbinates. Postnasal drainage.  Eyes: Conjunctivae are normal. Pupils are equal, round, and reactive to light.  Cardiovascular: Normal rate and regular rhythm.  No murmur heard. HR by my count 100/min  Respiratory: Effort normal and breath sounds normal. No respiratory distress.  Lymphadenopathy:       Head (right side): No submandibular adenopathy present.       Head (left side): No submandibular adenopathy present.    She has no cervical adenopathy.  Neurological: She is alert and oriented to person, place, and time. She has normal strength. Gait normal.  Skin: Skin is warm. No rash noted. No erythema.  Psychiatric: Her mood appears anxious.  Well groomed, good eye contact.     ASSESSMENT AND PLAN:   Ms. Caral was seen today for facial pain.  Diagnoses and all orders for this  visit:  Maxillary sinusitis, unspecified chronicity  Because persistent and  worsening symptoms,abx was recommended. Problem could be aggravated by allergies. Side effects of abx discussed, recommended daily probiotic.  -     amoxicillin-clavulanate (AUGMENTIN) 875-125 MG tablet; Take 1 tablet by mouth 2 (two) times daily for 10 days.   Antibiotic-induced yeast infection  Recommend starting Diflucan if she develops symptoms.  -     fluconazole (DIFLUCAN) 150 MG tablet; Take 1 tablet (150 mg total) by mouth once for 1 dose.   RHINITIS, CHRONIC She prefers to hold on referral to immunologist for now. Change Zyrtec to Allegra 180 mg daily. Stop Flonase nasal spray and will try Nasonex nasal spray. No changes in Singulair or Astelin nasal spray. F/U in 3 months.  Bipolar 1 disorder, mixed, moderate (HCC) Reported as stable. No changes in current management. Psychiatry referral placed as requested.   Hyperlipidemia No changes in Crestor, some side effects discussed. Continue low fat diet. F/U in 3 months.   Abnormal liver function test Alkaline phosphatase still elevated with last lab in 09/2016 but improved. ALT was normal in 09/2016. We discussed lab results and she agrees with rechecking LFTs with next labs in about 3 months.     Return in about 3 months (around 07/02/2017) for HLD,.     -Ms.ERYNNE KEALEY was advised to seek immediate medical attention if sudden worsening symptoms or to follow if they persist or if new concerns arise.       Terius Jacuinde G. Martinique, MD  [facility]. [place] office.

## 2017-04-04 ENCOUNTER — Ambulatory Visit (INDEPENDENT_AMBULATORY_CARE_PROVIDER_SITE_OTHER): Payer: Medicare HMO | Admitting: Family Medicine

## 2017-04-04 ENCOUNTER — Encounter: Payer: Self-pay | Admitting: Family Medicine

## 2017-04-04 ENCOUNTER — Ambulatory Visit: Payer: Self-pay | Admitting: Family Medicine

## 2017-04-04 VITALS — BP 130/84 | HR 106 | Temp 98.2°F | Resp 12 | Ht 60.5 in | Wt 173.1 lb

## 2017-04-04 DIAGNOSIS — J310 Chronic rhinitis: Secondary | ICD-10-CM | POA: Diagnosis not present

## 2017-04-04 DIAGNOSIS — R7989 Other specified abnormal findings of blood chemistry: Secondary | ICD-10-CM | POA: Insufficient documentation

## 2017-04-04 DIAGNOSIS — J320 Chronic maxillary sinusitis: Secondary | ICD-10-CM

## 2017-04-04 DIAGNOSIS — R748 Abnormal levels of other serum enzymes: Secondary | ICD-10-CM | POA: Diagnosis not present

## 2017-04-04 DIAGNOSIS — R945 Abnormal results of liver function studies: Secondary | ICD-10-CM

## 2017-04-04 DIAGNOSIS — F3162 Bipolar disorder, current episode mixed, moderate: Secondary | ICD-10-CM

## 2017-04-04 DIAGNOSIS — B379 Candidiasis, unspecified: Secondary | ICD-10-CM

## 2017-04-04 DIAGNOSIS — R69 Illness, unspecified: Secondary | ICD-10-CM | POA: Diagnosis not present

## 2017-04-04 DIAGNOSIS — T3695XA Adverse effect of unspecified systemic antibiotic, initial encounter: Secondary | ICD-10-CM | POA: Diagnosis not present

## 2017-04-04 DIAGNOSIS — E782 Mixed hyperlipidemia: Secondary | ICD-10-CM | POA: Diagnosis not present

## 2017-04-04 MED ORDER — FLUCONAZOLE 150 MG PO TABS: 150.0000 mg | ORAL_TABLET | Freq: Once | ORAL | 0 refills | Status: AC

## 2017-04-04 MED ORDER — MOMETASONE FUROATE 50 MCG/ACT NA SUSP: 2.0000 | Freq: Every day | NASAL | 4 refills | Status: DC

## 2017-04-04 MED ORDER — FEXOFENADINE HCL 180 MG PO TABS: 180.0000 mg | ORAL_TABLET | Freq: Every day | ORAL | 3 refills | Status: DC

## 2017-04-04 MED ORDER — AMOXICILLIN-POT CLAVULANATE 875-125 MG PO TABS: 1.0000 | ORAL_TABLET | Freq: Two times a day (BID) | ORAL | 0 refills | Status: AC

## 2017-04-04 NOTE — Assessment & Plan Note (Signed)
Reported as stable. No changes in current management. Psychiatry referral placed as requested.

## 2017-04-04 NOTE — Assessment & Plan Note (Signed)
She prefers to hold on referral to immunologist for now. Change Zyrtec to Allegra 180 mg daily. Stop Flonase nasal spray and will try Nasonex nasal spray. No changes in Singulair or Astelin nasal spray. F/U in 3 months.

## 2017-04-04 NOTE — Assessment & Plan Note (Signed)
Alkaline phosphatase still elevated with last lab in 09/2016 but improved. ALT was normal in 09/2016. We discussed lab results and she agrees with rechecking LFTs with next labs in about 3 months.

## 2017-04-04 NOTE — Patient Instructions (Addendum)
A few things to remember from today's visit:   Maxillary sinusitis, unspecified chronicity - Plan: amoxicillin-clavulanate (AUGMENTIN) 875-125 MG tablet  RHINITIS, CHRONIC - Plan: fexofenadine (ALLEGRA ALLERGY) 180 MG tablet, mometasone (NASONEX) 50 MCG/ACT nasal spray  Mixed hyperlipidemia  Elevated alkaline phosphatase level  Bipolar 1 disorder, mixed, moderate (HCC) - Plan: Ambulatory referral to Psychiatry  Lab Results  Component Value Date   CHOL 244 (H) 09/14/2016   HDL 59.90 09/14/2016   LDLCALC 159 (H) 03/11/2016   LDLDIRECT 143.0 09/14/2016   TRIG 222.0 (H) 09/14/2016   CHOLHDL 4 09/14/2016   Lab Results  Component Value Date   ALT 11 09/14/2016   AST 13 09/14/2016   ALKPHOS 128 (H) 09/14/2016   BILITOT 0.6 09/14/2016    Please be sure medication list is accurate. If a new problem present, please set up appointment sooner than planned today.

## 2017-04-04 NOTE — Assessment & Plan Note (Signed)
No changes in Crestor, some side effects discussed. Continue low fat diet. F/U in 3 months.

## 2017-04-05 ENCOUNTER — Telehealth: Payer: Self-pay | Admitting: Family Medicine

## 2017-04-05 NOTE — Telephone Encounter (Signed)
Copied from [place] [id number]. Topic: Quick Communication - See Telephone Encounter >> Apr 05, 2017 11:57 AM Ahmed Prima L wrote: CRM for notification. See Telephone encounter for:   04/05/17.  When pt when to pick up her Allegra, she was told that it was not covered by her insurance. Please advise. She would like something else in place of it.  [id number] [place], [place] - [address]

## 2017-04-05 NOTE — Telephone Encounter (Signed)
Spoke with patient, she stated that she would find out what medication her insurance will cover that are similar to [place] and give clinic a call back with the medication name that she want sent to her pharmacy.

## 2017-04-05 NOTE — Telephone Encounter (Signed)
Left message to give clinic a call back concerning medication. 

## 2017-04-06 DIAGNOSIS — F411 Generalized anxiety disorder: Secondary | ICD-10-CM | POA: Diagnosis not present

## 2017-04-06 DIAGNOSIS — R69 Illness, unspecified: Secondary | ICD-10-CM | POA: Diagnosis not present

## 2017-04-08 ENCOUNTER — Other Ambulatory Visit: Payer: Self-pay | Admitting: *Deleted

## 2017-04-08 MED ORDER — LORATADINE 10 MG PO TABS: 10.0000 mg | ORAL_TABLET | Freq: Every day | ORAL | 2 refills | Status: DC

## 2017-04-08 NOTE — Telephone Encounter (Signed)
Copied from [employer] [phone]. Topic: Quick Communication - See Telephone Encounter >> Apr 08, 2017 10:02 AM Neva Seat wrote: Insurance will not cover the  [employer]. They would not give any substitute recommendations.  They cannot cover the counter medications even if it's prescribed by Dr.  Abbott Pao needing something else prescribed that insurance will possibly cover.   Please call pt to discuss or let her know what is being prescribed.

## 2017-04-08 NOTE — Telephone Encounter (Signed)
[place] Call back [phone] [id number] [place], [place] [address]  Pt states the loratadine is not covered under ins. Please see if there is an older allergy med that insurance will pay for that cannot be purchased OTC.

## 2017-04-08 NOTE — Telephone Encounter (Signed)
I already recommended treatment and according to pt, Allegra is not covered bu her insurance. Most antihistaminics are OTC and most insurance do not cover. She did not feel like Zyrtec was not helping,so she can try a different one. I already gave her some options, she needs to make a decision based on cost and/or insurance coverage.  Thanks, BJ

## 2017-04-08 NOTE — Telephone Encounter (Signed)
Pt called back.  He insurance company is refusing to provide any substitutions regarding medications.  They have told the pt that it is up to provider to select the appropriate medication for the pt.   cb is - [phone]

## 2017-04-08 NOTE — Telephone Encounter (Signed)
Rx for Loratadine 10 mg sent to pharmacy, patient informed.

## 2017-04-08 NOTE — Telephone Encounter (Signed)
Options: Loratadine, xyzal 5 mg, or Clarinex 5 mg. If neither one is covered,she may need to go back to Zyrtec.  Thanks, BJ

## 2017-04-08 NOTE — Telephone Encounter (Signed)
Left message for patient to give clinic a call back concerning medication. 

## 2017-04-11 NOTE — Telephone Encounter (Signed)
Spoke with patient gave instructions per Dr. Martinique. Patient verbalized understanding.

## 2017-04-15 DIAGNOSIS — F411 Generalized anxiety disorder: Secondary | ICD-10-CM | POA: Diagnosis not present

## 2017-04-15 DIAGNOSIS — R69 Illness, unspecified: Secondary | ICD-10-CM | POA: Diagnosis not present

## 2017-04-18 ENCOUNTER — Other Ambulatory Visit: Payer: Self-pay | Admitting: Family Medicine

## 2017-04-18 DIAGNOSIS — Z1231 Encounter for screening mammogram for malignant neoplasm of breast: Secondary | ICD-10-CM

## 2017-04-21 DIAGNOSIS — R69 Illness, unspecified: Secondary | ICD-10-CM | POA: Diagnosis not present

## 2017-04-21 DIAGNOSIS — F411 Generalized anxiety disorder: Secondary | ICD-10-CM | POA: Diagnosis not present

## 2017-04-25 ENCOUNTER — Other Ambulatory Visit: Payer: Self-pay | Admitting: Family Medicine

## 2017-04-25 DIAGNOSIS — E559 Vitamin D deficiency, unspecified: Secondary | ICD-10-CM

## 2017-04-28 DIAGNOSIS — F411 Generalized anxiety disorder: Secondary | ICD-10-CM | POA: Diagnosis not present

## 2017-04-28 DIAGNOSIS — R69 Illness, unspecified: Secondary | ICD-10-CM | POA: Diagnosis not present

## 2017-05-05 DIAGNOSIS — R69 Illness, unspecified: Secondary | ICD-10-CM | POA: Diagnosis not present

## 2017-05-05 DIAGNOSIS — F411 Generalized anxiety disorder: Secondary | ICD-10-CM | POA: Diagnosis not present

## 2017-05-07 ENCOUNTER — Other Ambulatory Visit: Payer: Self-pay | Admitting: Family Medicine

## 2017-05-07 DIAGNOSIS — J310 Chronic rhinitis: Secondary | ICD-10-CM

## 2017-05-11 ENCOUNTER — Ambulatory Visit
Admission: RE | Admit: 2017-05-11 | Discharge: 2017-05-11 | Disposition: A | Discharge status: Home / Self Care | Payer: Medicare HMO | Source: Ambulatory Visit | Attending: Family Medicine | Admitting: Family Medicine

## 2017-05-11 DIAGNOSIS — Z1231 Encounter for screening mammogram for malignant neoplasm of breast: Secondary | ICD-10-CM

## 2017-05-16 ENCOUNTER — Other Ambulatory Visit: Payer: Self-pay | Admitting: *Deleted

## 2017-05-16 DIAGNOSIS — N3941 Urge incontinence: Secondary | ICD-10-CM | POA: Diagnosis not present

## 2017-05-16 DIAGNOSIS — N3010 Interstitial cystitis (chronic) without hematuria: Secondary | ICD-10-CM | POA: Diagnosis not present

## 2017-05-16 DIAGNOSIS — J310 Chronic rhinitis: Secondary | ICD-10-CM

## 2017-05-16 DIAGNOSIS — R3982 Chronic bladder pain: Secondary | ICD-10-CM | POA: Diagnosis not present

## 2017-05-16 MED ORDER — AZELASTINE HCL 0.15 % NA SOLN: NASAL | 3 refills | Status: DC

## 2017-05-17 ENCOUNTER — Other Ambulatory Visit: Payer: Self-pay | Admitting: Urology

## 2017-05-20 ENCOUNTER — Other Ambulatory Visit: Payer: Self-pay | Admitting: Family Medicine

## 2017-05-20 DIAGNOSIS — M810 Age-related osteoporosis without current pathological fracture: Secondary | ICD-10-CM

## 2017-05-23 ENCOUNTER — Encounter (HOSPITAL_BASED_OUTPATIENT_CLINIC_OR_DEPARTMENT_OTHER): Payer: Self-pay

## 2017-05-23 ENCOUNTER — Other Ambulatory Visit: Payer: Self-pay | Admitting: Family Medicine

## 2017-05-23 DIAGNOSIS — J310 Chronic rhinitis: Secondary | ICD-10-CM

## 2017-05-24 ENCOUNTER — Encounter (HOSPITAL_BASED_OUTPATIENT_CLINIC_OR_DEPARTMENT_OTHER): Payer: Self-pay | Admitting: *Deleted

## 2017-05-25 ENCOUNTER — Encounter (HOSPITAL_BASED_OUTPATIENT_CLINIC_OR_DEPARTMENT_OTHER): Payer: Self-pay | Admitting: *Deleted

## 2017-05-25 ENCOUNTER — Other Ambulatory Visit: Payer: Self-pay

## 2017-05-25 NOTE — Progress Notes (Signed)
SPOKE W/ PT VIA PHONE FOR PRE-OP INTERVIEW.   NPO AFTER MN.  ARRIVE AT 0800.  WILL TAKE GABAPENTIN, GEODON, CLARITIN, CRESTOR, SYNTHROID, AND DO SYMBICORT INHALER, NASAL SPRAY'S AM DOS W/ SIPS OF WATER.

## 2017-05-26 ENCOUNTER — Other Ambulatory Visit: Payer: Self-pay | Admitting: Family Medicine

## 2017-05-26 DIAGNOSIS — E559 Vitamin D deficiency, unspecified: Secondary | ICD-10-CM

## 2017-06-01 NOTE — H&P (Signed)
CC: I have interstitial cystitis.  HPI: [name] is a 62 year-old female established patient who is here for interstitial cystitis.  She is having problems with urinary control or incontinence. She is incontinent immediately following the strong urge to urinate.   [name] is a 62 yo WF with a 4 yr history of IC. She is a former patient of Dr. Terance Hart. She had to change to Dr. Amalia Hailey for insurance reasons but has had another change and is back with [place]. She had good relief remotely with Elmiron but she has not been on if for several years. Has had DMSO and Clorapactin in the past but has been several yrs since last treatment. She tried to get DMSO at her last visit but it was too expensive for her to get. She is currently has cramping and urethral pain. The symptoms are variable but worse over the last several days. She is currently on Elavil 10mg  daily. She was on 25mg  but her bipolar meds manager cut her back. She is also on hydroxyzine 25mg  qam and 50mg  qpm. She has had HOD's in the past with benefit. The last was 18-20 years ago. She has had no hematuria and only rare dysuria. She has incontinence that is primarily urge related. She uses 1ppd. She feels like she empties most of the time. She occasionally has a reduced stream with hesitancy. The pain is not relieved by voiding. She has nocturia x 4 and has frequency q1:15 to q1:30. If she doesn't void she has increased pain and incontinence. She has no GI complaints. She is not sexually active. She is using estrace cream for vaginal atrophy. She has not had pelvic floor physican therapy.      ALLERGIES: AmLODIPine Besylate TABS - Other Reaction, unknown Aspirin TABS - Other Reaction, tinnitus GI Upset bupivacaine - Hives Codeine Derivatives - Nausea Contrast Dye - Other Reaction, Itching, flushed etodolac - Other Reaction, unknown Hydrocodone-Acetaminophen CAPS - Nausea indomethacin - Other Reaction, muscle  spasms Pentazocine-Acetaminophen TABS - Dizziness propranolol - Other Reaction, unknown Pseudoephedrine TABS - Other Reaction, insomnia risperidone - Other Reaction, unknown Sulfonamide Derivatives - Other Reaction, unknown Talwin SOLN - Other Reaction, unknown TraMADol HCl TABS - Other Reaction, unknown    MEDICATIONS: Aleve 220 MG Oral Capsule Oral  Amitriptyline Hcl 10 mg tablet 1 tablet PO Daily  Bismuth 262 mg tablet,chewable Oral  Cetirizine Hcl 10 mg tablet Oral  Divalproex Sodium Er 250 mg tablet, extended release 24 hr Oral  Estrace 0.01 % cream with applicator Vaginal  Famotidine 20 mg tablet Oral  Fluticasone Propionate 50 mcg/actuation spray, suspension Nasal  Fosamax  Gabapentin 300 mg capsule Oral  Hydrocortisone 2.5 % cream with perineal applicator Rectal  Levothyroxine Sodium 50 mcg tablet Oral  Lithium Carbonate Er 300 mg tablet, extended release Oral  Loperamide HCl - 2 MG Oral Tablet Oral  Rimso-50 50 % solution, non-oral Mix 50 ml Rimso along with 50 ml Sodium Bicarb  Sertraline Hcl 100 mg tablet Oral  Sertraline Hcl 100 mg tablet  Ziprasidone Hcl 20 mg capsule Oral     GU PSH: Biopsy Skin Lesion - 2017 D&C Non-OB - 2017 Endometrial Ablation - 2017      PSH Notes: finger surgery-left pinky   NON-GU PSH: Aspirate/inj Ganglion Cyst Breast Biopsy - 2017 Cholecystectomy (laparoscopic) Correct Bunion - 2017 Dental Surgery Procedure - 2017 Elbow Arthroscopy/surgery Hand/finger Surgery Knee Arthroscopy; Dx, Right - 2017, Left - 2017 Reconstruct Jaw Joint - 2017 Rectal Surgery (Unspecified) -  2017 Remove Tonsils - 2017 Remove Wrist Tendon Lesion - 2017 Repair Nasal Septum Defect Skin Biopsy Wrist Arthroscopy/surgery    GU PMH: Weak Urinary Stream (Worsening, Chronic), If sxs persist or worsen may need to refer to PT/OT - 05/11/2016 Interstitial Cystitis (w/o hematuria) - 10/22/2015, Interstitial cystitis, - 2017      PMH Notes: Appears to be a long  time diagnosis of Interstitial Cystitis. She has been on multi-modality therapy in the past including dietary modification, Elavil and hydrodistention (where Hunner ulcers were visible) with instillation of intravesical therapy.   Prior treatments have included;   1. Dietary modifications (current)  2. Anticholinergic therapy (Ditropan 10 and 15 mg--prior)  3. Hydroxyzine - ins no longer cover  4. Amitryptiline 25 mg QHS (current)  5. Cystoscopy with hydrodistention and instillation of Clorpactin   More recently, she ended up in the care of Dr. Lawrence Santiago at [facility] who started her on hydroxyzine, amitryptilline and estrace cream for vaginal dryness. She has noticed significant benefit on this.   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   She tried Vesicare for frequency, urgency and then went back on oxybutynin 10 mg po TID. Caused too much dry mouth. Needs refill for amitryptyline. Ins no longer covers hydroxyzine. Her Cr was up and she was told to back off NSAIDs.    NON-GU PMH: Bipolar disorder, unspecified, Bipolar disorder - 2017 Anxiety, Anxiety and depression - 2017 Encounter for general adult medical examination without abnormal findings, Encounter for preventive health examination - 2017 Personal history of other diseases of the musculoskeletal system and connective tissue, History of arthritis - 2017 Personal history of other diseases of the nervous system and sense organs, History of sleep apnea - 2017 Personal history of other diseases of the respiratory system, History of asthma - 2017 Personal history of other endocrine, nutritional and metabolic disease, History of hypothyroidism - 2017 Personal history of other specified conditions, History of heartburn -  2017 Skin Cancer, History, History of skin cancer - 2017    FAMILY HISTORY: cardiac disorder - Runs In Family Death of family member - Runs In Family Hypertension - Runs In Family testicular cancer - Runs In Family Thyroid Disease - Runs In Family   SOCIAL HISTORY: Marital Status: Divorced Preferred Language: English; Race: White Current Smoking Status: Patient has never smoked.  Drinks 1 caffeinated drink per day.    REVIEW OF SYSTEMS:    GU Review Female:   Patient reports frequent urination, hard to postpone urination, get up at night to urinate, leakage of urine, and stream starts and stops. Patient denies burning /pain with urination, trouble starting your stream, have to strain to urinate, and being pregnant.  Gastrointestinal (Upper):   Patient denies nausea, vomiting, and indigestion/ heartburn.  Gastrointestinal (Lower):   Patient denies diarrhea and constipation.  Constitutional:   Patient reports fatigue. Patient denies night sweats and weight loss.  Skin:   Patient reports skin rash/ lesion. Patient denies itching.  Eyes:   Patient denies blurred vision and double vision.  Ears/ Nose/ Throat:   Patient denies sore throat and sinus problems.  Hematologic/Lymphatic:   Patient denies swollen glands and easy bruising.  Cardiovascular:   Patient denies leg swelling and chest pains.  Respiratory:   Patient denies cough and shortness of breath.  Endocrine:   Patient reports excessive thirst.   Musculoskeletal:   Patient reports back pain and joint pain.   Neurological:   Patient denies headaches and dizziness.  Psychologic:   Patient reports depression and anxiety.    VITAL SIGNS:      05/16/2017 03:12 PM  Weight 169 lb / 76.66 kg  Height 61 in / 154.94 cm  BMI 31.9 kg/m   GU PHYSICAL EXAMINATION:    External Genitalia: No hirsutism, no rash, no scarring, no cyst, no erythematous lesion, no papular lesion, no blanched lesion, no warty lesion. No edema.  Urethral Meatus:  Normal size. Normal position. No discharge.  Urethra: No tenderness, no mass, no scarring. No hypermobility. No leakage.  Bladder: Bladder tender. Normal to palpation, no mass, normal size.   Vagina: Moderate vaginal atrophy, moderate introital stenosis. No rectocele. No cystocele. No enterocele.   Cervix: Cervical tenderness with motion. No inflammation, no discharge, no lesion, no wart.   Uterus: Normal size. Normal consistency. Normal position. No mobility. No descent.   Adnexa / Parametria: No tenderness. No adnexal mass.   MULTI-SYSTEM PHYSICAL EXAMINATION:    Constitutional: Well-nourished. No physical deformities. Normally developed. Good grooming.  Respiratory: No labored breathing, no use of accessory muscles. Normal breath sounds.  Cardiovascular: Regular rate and rhythm. No murmur, no gallop.   Gastrointestinal: Abdominal tenderness mild in SP, obese. No hernia. No mass, no rigidity.      PAST DATA REVIEWED:  Source Of History:  Patient  Records Review:   Previous Patient Records  Urine Test Review:   Urinalysis   PROCEDURES:           PVR Ultrasound - 28315  Scanned Volume: 48 cc         Urinalysis Dipstick Dipstick Cont'd  Color: Yellow Bilirubin: Neg  Appearance: Clear Ketones: Neg  Specific Gravity: 1.010 Blood: Neg  pH: <=5.0 Protein: Neg  Glucose: Neg Urobilinogen: 0.2    Nitrites: Neg    Leukocyte Esterase: Neg    ASSESSMENT:      ICD-10 Details  1 GU:   Interstitial Cystitis (w/o hematuria) - N30.10 Worsening - She has chronic IC with increased pain. I discussed options including resumption of Elmiron, PT and cystoscopy with HOD. I am going to have her go for cystoscopy with HOD since that helped her in the past and then will consider resumption of Elimiron. I reviewed the risks of HOD including bleeding,infection, bladder injury, retention, thrombotic events and anesthetic complications.   2   Suprapubic pain - R39.82 Worsening  3   Urge incontinence -  N39.41 Stable - She has moderate UUI.    PLAN:           Schedule Return Visit/Planned Activity: Next Available Appointment - Schedule Surgery

## 2017-06-02 ENCOUNTER — Encounter (HOSPITAL_BASED_OUTPATIENT_CLINIC_OR_DEPARTMENT_OTHER): Payer: Self-pay

## 2017-06-02 ENCOUNTER — Encounter (HOSPITAL_BASED_OUTPATIENT_CLINIC_OR_DEPARTMENT_OTHER)
Admission: RE | Disposition: A | Discharge status: Home / Self Care | Payer: Self-pay | Source: Ambulatory Visit | Attending: Urology

## 2017-06-02 ENCOUNTER — Ambulatory Visit (HOSPITAL_BASED_OUTPATIENT_CLINIC_OR_DEPARTMENT_OTHER)
Admission: RE | Admit: 2017-06-02 | Discharge: 2017-06-02 | Disposition: A | Discharge status: Home / Self Care | Payer: Medicare HMO | Source: Ambulatory Visit | Attending: Urology | Admitting: Urology

## 2017-06-02 ENCOUNTER — Ambulatory Visit (HOSPITAL_BASED_OUTPATIENT_CLINIC_OR_DEPARTMENT_OTHER): Payer: Medicare HMO | Admitting: Anesthesiology

## 2017-06-02 DIAGNOSIS — K219 Gastro-esophageal reflux disease without esophagitis: Secondary | ICD-10-CM | POA: Insufficient documentation

## 2017-06-02 DIAGNOSIS — J45909 Unspecified asthma, uncomplicated: Secondary | ICD-10-CM | POA: Insufficient documentation

## 2017-06-02 DIAGNOSIS — E039 Hypothyroidism, unspecified: Secondary | ICD-10-CM | POA: Diagnosis not present

## 2017-06-02 DIAGNOSIS — R69 Illness, unspecified: Secondary | ICD-10-CM | POA: Diagnosis not present

## 2017-06-02 DIAGNOSIS — F319 Bipolar disorder, unspecified: Secondary | ICD-10-CM | POA: Diagnosis not present

## 2017-06-02 DIAGNOSIS — E785 Hyperlipidemia, unspecified: Secondary | ICD-10-CM | POA: Diagnosis not present

## 2017-06-02 DIAGNOSIS — N3941 Urge incontinence: Secondary | ICD-10-CM | POA: Diagnosis not present

## 2017-06-02 DIAGNOSIS — G4730 Sleep apnea, unspecified: Secondary | ICD-10-CM | POA: Insufficient documentation

## 2017-06-02 DIAGNOSIS — I10 Essential (primary) hypertension: Secondary | ICD-10-CM | POA: Insufficient documentation

## 2017-06-02 DIAGNOSIS — Z79899 Other long term (current) drug therapy: Secondary | ICD-10-CM | POA: Insufficient documentation

## 2017-06-02 DIAGNOSIS — N3010 Interstitial cystitis (chronic) without hematuria: Secondary | ICD-10-CM | POA: Diagnosis not present

## 2017-06-02 DIAGNOSIS — E559 Vitamin D deficiency, unspecified: Secondary | ICD-10-CM | POA: Diagnosis not present

## 2017-06-02 HISTORY — DX: Personal history of malignant melanoma of skin: Z85.820

## 2017-06-02 HISTORY — DX: Claustrophobia: F40.240

## 2017-06-02 HISTORY — DX: Dependence on other enabling machines and devices: Z99.89

## 2017-06-02 HISTORY — DX: Other specified postprocedural states: Z98.890

## 2017-06-02 HISTORY — DX: Unspecified osteoarthritis, unspecified site: M19.90

## 2017-06-02 HISTORY — DX: Allergic rhinitis, unspecified: J30.9

## 2017-06-02 HISTORY — DX: Presence of spectacles and contact lenses: Z97.3

## 2017-06-02 HISTORY — DX: Limited mandibular range of motion: M26.52

## 2017-06-02 HISTORY — DX: Arthralgia of temporomandibular joint, unspecified side: M26.629

## 2017-06-02 HISTORY — DX: Personal history of malignant neoplasm, unspecified: Z85.9

## 2017-06-02 HISTORY — DX: Personal history of other mental and behavioral disorders: Z86.59

## 2017-06-02 HISTORY — DX: Other symptoms and signs involving the genitourinary system: R39.89

## 2017-06-02 HISTORY — DX: Personal history of other diseases of the digestive system: Z87.19

## 2017-06-02 HISTORY — DX: Essential tremor: G25.0

## 2017-06-02 HISTORY — DX: Elevated blood-pressure reading, without diagnosis of hypertension: R03.0

## 2017-06-02 HISTORY — DX: Obstructive sleep apnea (adult) (pediatric): G47.33

## 2017-06-02 HISTORY — DX: Migraine, unspecified, not intractable, without status migrainosus: G43.909

## 2017-06-02 HISTORY — DX: Bipolar disorder, current episode mixed, moderate: F31.62

## 2017-06-02 HISTORY — PX: CYSTO WITH HYDRODISTENSION: SHX5453

## 2017-06-02 SURGERY — CYSTOSCOPY, WITH BLADDER HYDRODISTENSION
Anesthesia: General

## 2017-06-02 MED ORDER — ACETAMINOPHEN 325 MG PO TABS
650.0000 mg | ORAL_TABLET | ORAL | Status: DC | PRN
  Filled 2017-06-02: qty 2

## 2017-06-02 MED ORDER — SODIUM CHLORIDE 0.9 % IV SOLN
250.0000 mL | INTRAVENOUS | Status: DC | PRN
  Filled 2017-06-02: qty 250

## 2017-06-02 MED ORDER — DEXAMETHASONE SODIUM PHOSPHATE 10 MG/ML IJ SOLN
INTRAMUSCULAR | Status: AC
  Filled 2017-06-02: qty 1

## 2017-06-02 MED ORDER — SODIUM CHLORIDE 0.9% FLUSH
3.0000 mL | INTRAVENOUS | Status: DC | PRN
  Filled 2017-06-02: qty 3

## 2017-06-02 MED ORDER — ACETAMINOPHEN 160 MG/5ML PO SOLN
325.0000 mg | ORAL | Status: DC | PRN
  Filled 2017-06-02: qty 20.3

## 2017-06-02 MED ORDER — LIDOCAINE 2% (20 MG/ML) 5 ML SYRINGE
INTRAMUSCULAR | Status: DC | PRN
  Administered 2017-06-02: 40 mg via INTRAVENOUS

## 2017-06-02 MED ORDER — CEFAZOLIN SODIUM-DEXTROSE 2-4 GM/100ML-% IV SOLN
INTRAVENOUS | Status: AC
  Filled 2017-06-02: qty 100

## 2017-06-02 MED ORDER — FENTANYL CITRATE (PF) 100 MCG/2ML IJ SOLN
25.0000 ug | INTRAMUSCULAR | Status: DC | PRN
  Administered 2017-06-02: 25 ug via INTRAVENOUS
  Filled 2017-06-02: qty 1

## 2017-06-02 MED ORDER — PROPOFOL 10 MG/ML IV BOLUS
INTRAVENOUS | Status: DC | PRN
  Administered 2017-06-02: 150 mg via INTRAVENOUS
  Administered 2017-06-02: 50 mg via INTRAVENOUS

## 2017-06-02 MED ORDER — ACETAMINOPHEN 650 MG RE SUPP
650.0000 mg | RECTAL | Status: DC | PRN
  Filled 2017-06-02: qty 1

## 2017-06-02 MED ORDER — SODIUM CHLORIDE 0.9% FLUSH
3.0000 mL | Freq: Two times a day (BID) | INTRAVENOUS | Status: DC
  Filled 2017-06-02: qty 3

## 2017-06-02 MED ORDER — FENTANYL CITRATE (PF) 100 MCG/2ML IJ SOLN
INTRAMUSCULAR | Status: AC
  Filled 2017-06-02: qty 2

## 2017-06-02 MED ORDER — ONDANSETRON HCL 4 MG/2ML IJ SOLN
INTRAMUSCULAR | Status: AC
Start: 2017-06-02 — End: 2017-06-02
  Filled 2017-06-02: qty 2

## 2017-06-02 MED ORDER — DEXAMETHASONE SODIUM PHOSPHATE 4 MG/ML IJ SOLN
INTRAMUSCULAR | Status: DC | PRN
  Administered 2017-06-02: 10 mg via INTRAVENOUS

## 2017-06-02 MED ORDER — MIDAZOLAM HCL 2 MG/2ML IJ SOLN
INTRAMUSCULAR | Status: AC
  Filled 2017-06-02: qty 2

## 2017-06-02 MED ORDER — STERILE WATER FOR IRRIGATION IR SOLN
Status: DC | PRN
  Administered 2017-06-02: 3000 mL

## 2017-06-02 MED ORDER — LIDOCAINE 2% (20 MG/ML) 5 ML SYRINGE
INTRAMUSCULAR | Status: AC
  Filled 2017-06-02: qty 5

## 2017-06-02 MED ORDER — FENTANYL CITRATE (PF) 100 MCG/2ML IJ SOLN
INTRAMUSCULAR | Status: DC | PRN
  Administered 2017-06-02 (×2): 50 ug via INTRAVENOUS

## 2017-06-02 MED ORDER — MEPERIDINE HCL 25 MG/ML IJ SOLN
6.2500 mg | INTRAMUSCULAR | Status: DC | PRN
  Filled 2017-06-02: qty 1

## 2017-06-02 MED ORDER — PROPOFOL 10 MG/ML IV BOLUS
INTRAVENOUS | Status: AC
  Filled 2017-06-02: qty 20

## 2017-06-02 MED ORDER — ONDANSETRON HCL 4 MG/2ML IJ SOLN
INTRAMUSCULAR | Status: AC
  Filled 2017-06-02: qty 2

## 2017-06-02 MED ORDER — MIDAZOLAM HCL 5 MG/5ML IJ SOLN
INTRAMUSCULAR | Status: DC | PRN
  Administered 2017-06-02: 1 mg via INTRAVENOUS

## 2017-06-02 MED ORDER — HYDROMORPHONE HCL 2 MG PO TABS: 2.0000 mg | ORAL_TABLET | Freq: Four times a day (QID) | ORAL | 0 refills | Status: DC | PRN

## 2017-06-02 MED ORDER — OXYCODONE HCL 5 MG PO TABS
5.0000 mg | ORAL_TABLET | Freq: Once | ORAL | Status: DC | PRN
  Filled 2017-06-02: qty 1

## 2017-06-02 MED ORDER — LACTATED RINGERS IV SOLN
INTRAVENOUS | Status: DC
  Administered 2017-06-02: 09:00:00 via INTRAVENOUS
  Filled 2017-06-02: qty 1000

## 2017-06-02 MED ORDER — EPHEDRINE 5 MG/ML INJ
INTRAVENOUS | Status: AC
  Filled 2017-06-02: qty 10

## 2017-06-02 MED ORDER — FENTANYL CITRATE (PF) 100 MCG/2ML IJ SOLN
25.0000 ug | INTRAMUSCULAR | Status: DC | PRN
  Filled 2017-06-02: qty 1

## 2017-06-02 MED ORDER — ONDANSETRON HCL 4 MG/2ML IJ SOLN
INTRAMUSCULAR | Status: DC | PRN
  Administered 2017-06-02: 4 mg via INTRAVENOUS

## 2017-06-02 MED ORDER — ONDANSETRON HCL 4 MG/2ML IJ SOLN
4.0000 mg | Freq: Once | INTRAMUSCULAR | Status: DC | PRN
  Filled 2017-06-02: qty 2

## 2017-06-02 MED ORDER — ACETAMINOPHEN 325 MG PO TABS
325.0000 mg | ORAL_TABLET | ORAL | Status: DC | PRN
  Filled 2017-06-02: qty 2

## 2017-06-02 MED ORDER — HYDROMORPHONE HCL 2 MG PO TABS
2.0000 mg | ORAL_TABLET | Freq: Four times a day (QID) | ORAL | Status: DC | PRN
  Filled 2017-06-02: qty 1

## 2017-06-02 MED ORDER — OXYCODONE HCL 5 MG/5ML PO SOLN
5.0000 mg | Freq: Once | ORAL | Status: DC | PRN
  Filled 2017-06-02: qty 5

## 2017-06-02 MED ORDER — CEFAZOLIN SODIUM-DEXTROSE 2-4 GM/100ML-% IV SOLN
2.0000 g | INTRAVENOUS | Status: AC
  Administered 2017-06-02: 2 g via INTRAVENOUS
  Filled 2017-06-02: qty 100

## 2017-06-02 SURGICAL SUPPLY — 18 items
BAG DRAIN URO-CYSTO SKYTR STRL (DRAIN) ×2 IMPLANT
BAG DRN UROCATH (DRAIN) ×1
CATH ROBINSON RED A/P 16FR (CATHETERS) ×2 IMPLANT
CLOTH BEACON ORANGE TIMEOUT ST (SAFETY) ×2 IMPLANT
ELECT REM PT RETURN 9FT ADLT (ELECTROSURGICAL) ×2
ELECTRODE REM PT RTRN 9FT ADLT (ELECTROSURGICAL) ×1 IMPLANT
GLOVE SURG SS PI 8.0 STRL IVOR (GLOVE) ×2 IMPLANT
GOWN STRL REUS W/ TWL XL LVL3 (GOWN DISPOSABLE) ×1 IMPLANT
GOWN STRL REUS W/TWL XL LVL3 (GOWN DISPOSABLE) ×2
KIT TURNOVER CYSTO (KITS) ×2 IMPLANT
MANIFOLD NEPTUNE II (INSTRUMENTS) ×1 IMPLANT
NDL SAFETY ECLIPSE 18X1.5 (NEEDLE) ×1 IMPLANT
NEEDLE HYPO 18GX1.5 SHARP (NEEDLE) ×2
NS IRRIG 500ML POUR BTL (IV SOLUTION) IMPLANT
PACK CYSTO (CUSTOM PROCEDURE TRAY) ×2 IMPLANT
SYR 30ML LL (SYRINGE) ×2 IMPLANT
TUBE CONNECTING 12X1/4 (SUCTIONS) ×1 IMPLANT
WATER STERILE IRR 3000ML UROMA (IV SOLUTION) ×2 IMPLANT

## 2017-06-02 NOTE — Op Note (Signed)
Procedure: Cystoscopy with hydrodistention of the bladder.  Preop diagnosis: Interstitial cystitis.  Postop diagnosis: Same.  Surgeon: Dr. Irine Seal.  Anesthesia: General.  Drains: None.  Specimen: None.  EBL: None.  Complications: None.  Indications: Kacey is a 62 year old white female with a long history of interstitial cystitis she has had recent progressive symptoms.  She had prior improvement with hydrodistention several years ago and is elected to undergo that procedure again.  Procedure: She was given preoperative antibiotics with Ancef 2 g IV.  She was taken to the operating room where general anesthetic was induced.  She was placed in the lithotomy position and fitted with PAS hose.  She was prepped with chlorhexidine solution.  Cystoscopy was performed using the 23 [place] scope and 30 degree lens.  Examination revealed a normal urethra.  The bladder wall exhibited moderate trabeculation.  There was a small scar on the left lateral wall.  No tumors or stones were identified.  Ureteral orifices were unremarkable.  After thorough inspection the bladder was dilated to capacity under 80 cm of water pressure.  Her capacity under anesthesia was 950 mL.  Cystoscopy was repeated after dilation and bladder drainage.  She was noted to have a few glomerular hemorrhages at the base of the bladder in the right trigone.  No cracks or ulcers were noted.  The bladder was then drained and the scope was removed.  She was taken down from the lithotomy position, her anesthetic was reversed and she was moved to recovery room in stable condition.

## 2017-06-02 NOTE — Anesthesia Preprocedure Evaluation (Addendum)
Anesthesia Evaluation  Patient identified by MRN, date of birth, ID band Patient awake    Reviewed: Allergy & Precautions, H&P , Patient's Chart, lab work & pertinent test results, reviewed documented beta blocker date and time   History of Anesthesia Complications (+) PONV and history of anesthetic complications  Airway Mallampati: III  TM Distance: >3 FB Neck ROM: full  Mouth opening: Limited Mouth Opening  Dental no notable dental hx. (+) Caps, Implants Multiple TMJ surgeries with very limited opening:   Pulmonary asthma , sleep apnea and Continuous Positive Airway Pressure Ventilation ,    Pulmonary exam normal breath sounds clear to auscultation       Cardiovascular hypertension,  Rhythm:regular Rate:Normal     Neuro/Psych  Headaches, PSYCHIATRIC DISORDERS Anxiety Depression Bipolar Disorder    GI/Hepatic Neg liver ROS, GERD  Controlled,  Endo/Other  negative endocrine ROSHypothyroidism   Renal/GU negative Renal ROS  negative genitourinary   Musculoskeletal  (+) Arthritis , Osteoarthritis,    Abdominal   Peds negative pediatric ROS (+)  Hematology   Anesthesia Other Findings PONV (postoperative nausea and vomiting)    Bipolar 1 disorder (HCC)     Complication of anesthesia   Asthma      GERD  Neuromuscular disorder   Hypothyroidism     Depression    Hypertension     Sleep apnea  has cpap,        Reproductive/Obstetrics negative OB ROS                            Anesthesia Physical  Anesthesia Plan  ASA: III  Anesthesia Plan: General   Post-op Pain Management:    Induction: Intravenous  PONV Risk Score and Plan: 3 and Ondansetron, Treatment may vary due to age or medical condition, Midazolam and Dexamethasone  Airway Management Planned: Oral ETT and LMA  Additional Equipment:   Intra-op Plan:   Post-operative Plan: Extubation in OR  Informed Consent: I have  reviewed the patients History and Physical, chart, labs and discussed the procedure including the risks, benefits and alternatives for the proposed anesthesia with the patient or authorized representative who has indicated his/her understanding and acceptance.   Dental Advisory Given and Dental advisory given  Plan Discussed with: CRNA, Surgeon and Anesthesiologist  Anesthesia Plan Comments: (  Discussed general anesthesia, including possible nausea, instrumentation of airway, sore throat,pulmonary aspiration, etc. I asked if the were any outstanding questions, or  concerns before we proceeded.)       Anesthesia Quick Evaluation

## 2017-06-02 NOTE — Interval H&P Note (Signed)
History and Physical Interval Note:  06/02/2017 9:10 AM  [name]  has presented today for surgery, with the diagnosis of INTERSTITAL CYSTITIS  The various methods of treatment have been discussed with the patient and family. After consideration of risks, benefits and other options for treatment, the patient has consented to  Procedure(s): CYSTOSCOPY/HYDRODISTENSION OF BLADDER (N/A) as a surgical intervention .  The patient's history has been reviewed, patient examined, no change in status, stable for surgery.  I have reviewed the patient's chart and labs.  Questions were answered to the patient's satisfaction.     Irine Seal

## 2017-06-02 NOTE — Discharge Instructions (Addendum)
CYSTOSCOPY HOME CARE INSTRUCTIONS  Activity: Rest for the remainder of the day.  Do not drive or operate equipment today.  You may resume normal activities in one to two days as instructed by your physician.   Meals: Drink plenty of liquids and eat light foods such as gelatin or soup this evening.  You may return to a normal meal plan tomorrow.  Return to Work: You may return to work in one to two days or as instructed by your physician.  Special Instructions / Symptoms: Call your physician if any of these symptoms occur:   -persistent or heavy bleeding  -bleeding which continues after first few urination  -large blood clots that are difficult to pass  -urine stream diminishes or stops completely  -fever equal to or higher than 101 degrees Farenheit.  -cloudy urine with a strong, foul odor  -severe pain  Females should always wipe from front to back after elimination.  You may feel some burning pain when you urinate.  This should disappear with time.  Applying moist heat to the lower abdomen or a hot tub bath may help relieve the pain. \    Patient Signature:  ________________________________________________________  Nurse's Signature:  ________________________________________________________   Post Anesthesia Home Care Instructions  Activity: Get plenty of rest for the remainder of the day. A responsible individual must stay with you for 24 hours following the procedure.  For the next 24 hours, DO NOT: -Drive a car -Operate machinery -Drink alcoholic beverages -Take any medication unless instructed by your physician -Make any legal decisions or sign important papers.  Meals: Start with liquid foods such as gelatin or soup. Progress to regular foods as tolerated. Avoid greasy, spicy, heavy foods. If nausea and/or vomiting occur, drink only clear liquids until the nausea and/or vomiting subsides. Call your physician if vomiting continues.  Special  Instructions/Symptoms: Your throat may feel dry or sore from the anesthesia or the breathing tube placed in your throat during surgery. If this causes discomfort, gargle with warm salt water. The discomfort should disappear within 24 hours.  If you had a scopolamine patch placed behind your ear for the management of post- operative nausea and/or vomiting:  1. The medication in the patch is effective for 72 hours, after which it should be removed.  Wrap patch in a tissue and discard in the trash. Wash hands thoroughly with soap and water. 2. You may remove the patch earlier than 72 hours if you experience unpleasant side effects which may include dry mouth, dizziness or visual disturbances. 3. Avoid touching the patch. Wash your hands with soap and water after contact with the patch.     

## 2017-06-02 NOTE — Transfer of Care (Signed)
Immediate Anesthesia Transfer of Care Note  Patient: [name]  Procedure(s) Performed: CYSTOSCOPY/HYDRODISTENSION OF BLADDER (N/A )  Patient Location: PACU  Anesthesia Type:General  Level of Consciousness: awake, alert , oriented and patient cooperative  Airway & Oxygen Therapy: Patient Spontanous Breathing and Patient connected to nasal cannula oxygen  Post-op Assessment: Report given to RN and Post -op Vital signs reviewed and stable  Post vital signs: Reviewed and stable  Last Vitals:  Vitals Value Taken Time  BP    Temp    Pulse 82 06/02/2017 10:13 AM  Resp 13 06/02/2017 10:13 AM  SpO2 100 % 06/02/2017 10:13 AM  Vitals shown include unvalidated device data.  Last Pain:  Vitals:   06/02/17 0845  TempSrc:   PainSc: 0-No pain      Patients Stated Pain Goal: 3 (28/36/62 [id number])  Complications: No apparent anesthesia complications

## 2017-06-02 NOTE — Anesthesia Procedure Notes (Signed)
Procedure Name: LMA Insertion Date/Time: 06/02/2017 9:39 AM Performed by: Wanita Chamberlain, CRNA Pre-anesthesia Checklist: Patient identified, Timeout performed, Emergency Drugs available, Suction available and Patient being monitored Patient Re-evaluated:Patient Re-evaluated prior to induction Oxygen Delivery Method: Circle system utilized Preoxygenation: Pre-oxygenation with 100% oxygen Induction Type: IV induction Ventilation: Mask ventilation without difficulty LMA: LMA inserted LMA Size: 4.0 Number of attempts: 2 Placement Confirmation: breath sounds checked- equal and bilateral,  CO2 detector and positive ETCO2 Tube secured with: Tape Dental Injury: Teeth and Oropharynx as per pre-operative assessment  Comments: Additional Propofol for relaxation of airway. Small mouth w/ limited opening

## 2017-06-02 NOTE — Anesthesia Postprocedure Evaluation (Signed)
Anesthesia Post Note  Patient: [name]  Procedure(s) Performed: CYSTOSCOPY/HYDRODISTENSION OF BLADDER (N/A )     Patient location during evaluation: PACU Anesthesia Type: General Level of consciousness: awake and alert Pain management: pain level controlled Vital Signs Assessment: post-procedure vital signs reviewed and stable Respiratory status: spontaneous breathing, nonlabored ventilation, respiratory function stable and patient connected to nasal cannula oxygen Cardiovascular status: blood pressure returned to baseline and stable Postop Assessment: no apparent nausea or vomiting Anesthetic complications: no    Last Vitals:  Vitals:   06/02/17 1045 06/02/17 1230  BP: (!) 123/48 129/76  Pulse: 71 83  Resp: (!) 9 16  Temp:  36.8 C  SpO2: 95% 100%    Last Pain:  Vitals:   06/02/17 1230  TempSrc:   PainSc: 4                  Tayvia Faughnan

## 2017-06-03 ENCOUNTER — Encounter (HOSPITAL_BASED_OUTPATIENT_CLINIC_OR_DEPARTMENT_OTHER): Payer: Self-pay | Admitting: Urology

## 2017-06-13 ENCOUNTER — Encounter: Payer: Self-pay | Admitting: Adult Health

## 2017-06-13 ENCOUNTER — Ambulatory Visit: Payer: Medicare HMO | Admitting: Adult Health

## 2017-06-13 DIAGNOSIS — J310 Chronic rhinitis: Secondary | ICD-10-CM | POA: Diagnosis not present

## 2017-06-13 DIAGNOSIS — Z9989 Dependence on other enabling machines and devices: Secondary | ICD-10-CM | POA: Diagnosis not present

## 2017-06-13 DIAGNOSIS — G4733 Obstructive sleep apnea (adult) (pediatric): Secondary | ICD-10-CM | POA: Diagnosis not present

## 2017-06-13 NOTE — Assessment & Plan Note (Signed)
Flare  Maximize sx coverage   Plan  Patient Instructions  Try to use CPAP each night. Try to get at least 4 hours on CPAP each night. Work on healthy weight Do not drive a sleepy Continue on Claritin and Singular daily  Add Saline nasal rinses Twice daily  As needed   Continue on nasonex and Astelin nasal  Add Chlortrimeton 4mg  At bedtime  .  Follow-up in 6 months and as needed with Dr. Halford Chessman

## 2017-06-13 NOTE — Assessment & Plan Note (Signed)
Needs improved CPAP compliance  Control Rhinitis sx   Plan  Patient Instructions  Try to use CPAP each night. Try to get at least 4 hours on CPAP each night. Work on healthy weight Do not drive a sleepy Continue on Claritin and Singular daily  Add Saline nasal rinses Twice daily  As needed   Continue on nasonex and Astelin nasal  Add Chlortrimeton 4mg  At bedtime  .  Follow-up in 6 months and as needed with Dr. Halford Chessman

## 2017-06-13 NOTE — Progress Notes (Signed)
Reviewed and agree with assessment/plan.   Cherrie Franca, MD [place] [facility] 02/04/2016, 12:24 PM Pager:  [phone]  

## 2017-06-13 NOTE — Progress Notes (Signed)
@Patient  ID: [name], female    DOB: 1955/08/04, 62 y.o.   MRN: [id number]  Chief Complaint  Patient presents with  . Follow-up    OSA    Referring provider: Martinique, Betty G, MD  HPI: 62 year old female followed for obstructive sleep apnea  TEST  10/2015 >home sleep study in September which showed AHI of 13.    06/13/2017 Follow up : OSA  Patient returns for a one year follow-up for sleep apnea. Says over last few months she has not been wearing her CPAP . Mainly due to allergies and sinus drainage. Says nose runs  Taking claritin, singulair and nasonex /astelin .  Does have some daytime sleepiness  We discussed CPAP compliance .     Allergies  Allergen Reactions  . Amlodipine Swelling    Peripheral edema  . Pseudoephedrine Other (See Comments)    I fly off the walls   . Risperidone And Related Other (See Comments)    Stomach upset, insomnia, drooling, tremors, "jerks," sensitivity to touch.   . Propoxyphene Itching    DARVOCET  . Sulfa Antibiotics Other (See Comments)    Unknown reaction  . Talwin [Pentazocine]     Unknown reaction  . Aspirin Other (See Comments)    REACTION: upset stomach  . Bupivacaine Hcl Hives  . Codeine Itching  . Etodolac Rash  . Indocin [Indomethacin] Other (See Comments)    Muscle spasms  . Ivp Dye [Iodinated Diagnostic Agents] Itching    Flushed and Fever, itch all over  . Pentazocine Lactate Itching    oozing  . Propranolol Nausea Only  . Tramadol Hcl Other (See Comments)    unknown    Immunization History  Administered Date(s) Administered  . Influenza Split 02/09/2012  . Influenza Whole 12/08/2006, 12/01/2007  . Influenza,inj,Quad PF,6+ Mos 12/11/2013, 11/21/2014, 10/13/2016  . Influenza-Unspecified 12/01/2015  . Pneumococcal Polysaccharide-23 12/12/2013  . Tdap 04/16/2011, 06/25/2013    Past Medical History:  Diagnosis Date  . Allergic rhinitis   . Benign essential tremor    hands  . Bipolar 1 disorder,  mixed, moderate ([place])   . Bladder pain   . Claustrophobia   . Depression   . History of frequent urinary tract infections   . History of gastroesophageal reflux (GERD)    05-25-2017  per pt no issues since hiatal hernia repair 01/ 2018  . History of hiatal hernia   . History of melanoma excision    early 2000s-- BACK  . History of panic attacks   . 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 Hypothyroidism   . Interstitial cystitis   . Limited jaw range of motion    s/p  bilateral TMJ surgery,  age 75s  . Lower urinary tract symptoms (LUTS)   . Migraines   . Mild asthma   . OA (osteoarthritis)   . OSA on CPAP    per last sleep study 09/ 2017  mild OSA, AHI13/hr  . PONV (postoperative nausea and vomiting)   . Rosacea   . Sensation of pressure in bladder area   . TMJ arthralgia   . Wears glasses   . White coat syndrome without hypertension    05-25-2017  per pt hx hypertension yrs ago, no issues after quitting stressful job    Tobacco History: Social History   Tobacco Use  Smoking Status Never Smoker  Smokeless Tobacco Never Used   Counseling given: Not Answered   Outpatient Encounter Medications  as of 06/13/2017  Medication Sig  . albuterol (PROVENTIL HFA;VENTOLIN HFA) 108 (90 Base) MCG/ACT inhaler Inhale 1-2 puffs into the lungs daily as needed for wheezing or shortness of breath. (Patient taking differently: Inhale 2 puffs into the lungs every 6 (six) hours as needed for wheezing or shortness of breath. )  . alendronate (FOSAMAX) 70 MG tablet TAKE 1 TABLET BY MOUTH EVERY 7 DAYS WITH FULL GLASS OF WATER ON EMPTY STOMACH  . anti-nausea (EMETROL) solution Take 30 mLs by mouth every 15 (fifteen) minutes as needed for nausea or vomiting.  . Azelastine HCl 0.15 % SOLN PLACE 2 SPRAYS IN BOTH NOSTRILS TWICE DAILY (Patient taking differently: Place 2 sprays into the nose every morning. PLACE 2 SPRAYS IN BOTH NOSTRILS TWICE DAILY)  . budesonide-formoterol  (SYMBICORT) 80-4.5 MCG/ACT inhaler Inhale 2 puffs into the lungs 2 (two) times daily. (Patient taking differently: Inhale 1 puff into the lungs 2 (two) times daily. )  . [employer] D3 5000 units capsule TAKE 2 TABLETS BY MOUTH EVERY DAY  . diphenhydrAMINE (BENADRYL) 25 mg capsule Take 25 mg by mouth at bedtime as needed for sleep.  Marland Kitchen estradiol (ESTRACE) 0.1 MG/GM vaginal cream Place 1 Applicatorful vaginally 3 (three) times a week. (Patient taking differently: Place 1 Applicatorful once a week vaginally. )  . gabapentin (NEURONTIN) 300 MG capsule Take 300 mg by mouth 3 (three) times daily.   . hydrocortisone cream 1 % Apply 1 application topically 2 (two) times daily as needed for itching.   . hydrOXYzine (ATARAX/VISTARIL) 25 MG tablet Take 25 mg by mouth 2 (two) times daily. Takes one tablet in am and if needed takes 1-2 tablets at bedtime  . ibuprofen (ADVIL,MOTRIN) 200 MG tablet Take 200-800 mg by mouth every 6 (six) hours as needed for headache or moderate pain. Depends on pain  . levothyroxine (SYNTHROID, LEVOTHROID) 50 MCG tablet TAKE 1 TABLET EVERY DAY (Patient taking differently: TAKE 1 TABLET EVERY DAY--- takes in am)  . loperamide (IMODIUM A-D) 2 MG tablet Take 2 mg by mouth 4 (four) times daily as needed for diarrhea or loose stools.  Marland Kitchen loratadine (CLARITIN) 10 MG tablet Take 1 tablet (10 mg total) by mouth daily. (Patient taking differently: Take 10 mg by mouth every morning. )  . mometasone (NASONEX) 50 MCG/ACT nasal spray Place 2 sprays into the nose every morning.  . montelukast (SINGULAIR) 10 MG tablet Take 1 tablet (10 mg total) by mouth at bedtime. (Patient taking differently: Take 10 mg by mouth at bedtime. )  . naproxen sodium (ANAPROX) 220 MG tablet Take 4 tablets (880 mg total) by mouth 2 (two) times daily as needed (headache).  . rizatriptan (MAXALT-MLT) 10 MG disintegrating tablet Take 1 tablet (10 mg total) by mouth daily as needed for migraine. May repeat in 2 hours if needed. Do not  exceed 2 doses in 1 week period  . rosuvastatin (CRESTOR) 20 MG tablet Take 1 tablet (20 mg total) by mouth daily. (Patient taking differently: Take 20 mg by mouth every morning. )  . sertraline (ZOLOFT) 100 MG tablet Take 2 tablets (200 mg total) by mouth daily. (Patient taking differently: Take 100 mg by mouth at bedtime. )  . Spacer/Aero-Holding Chambers (AEROCHAMBER PLUS) inhaler Use as instructed  . ziprasidone (GEODON) 20 MG capsule Take 20-40 mg by mouth 2 (two) times daily with a meal. 20 mg at noon and 40 mg in the evening  . [DISCONTINUED] Cholecalciferol (VITAMIN D3) 5000 units CAPS Take 1 capsule by  mouth 2 (two) times daily.  . [DISCONTINUED] HYDROmorphone (DILAUDID) 2 MG tablet Take 1 tablet (2 mg total) by mouth every 6 (six) hours as needed for severe pain. (Patient not taking: Reported on 06/13/2017)   No facility-administered encounter medications on file as of 06/13/2017.      Review of Systems  Constitutional:   No  weight loss, night sweats,  Fevers, chills,  +fatigue, or  lassitude.  HEENT:   No headaches,  Difficulty swallowing,  Tooth/dental problems, or  Sore throat,                No sneezing, itching, ear ache,  +nasal congestion, post nasal drip,   CV:  No chest pain,  Orthopnea, PND, swelling in lower extremities, anasarca, dizziness, palpitations, syncope.   GI  No heartburn, indigestion, abdominal pain, nausea, vomiting, diarrhea, change in bowel habits, loss of appetite, bloody stools.   Resp:  .  No chest wall deformity  Skin: no rash or lesions.  GU: no dysuria, change in color of urine, no urgency or frequency.  No flank pain, no hematuria   MS:  No joint pain or swelling.  No decreased range of motion.  No back pain.    Physical Exam  BP 106/66 (BP Location: Left Arm, Cuff Size: Normal)   Pulse 81   Ht 5' 0.5" (1.537 m)   Wt 172 lb (78 kg)   SpO2 96%   BMI 33.04 kg/m   GEN: A/Ox3; pleasant , NAD    HEENT:  [place]/AT,  EACs-clear, TMs-wnl,  NOSE-clear drainage  THROAT-clear, no lesions, no postnasal drip or exudate noted.   NECK:  Supple w/ fair ROM; no JVD; normal carotid impulses w/o bruits; no thyromegaly or nodules palpated; no lymphadenopathy.    RESP  Clear  P & A; w/o, wheezes/ rales/ or rhonchi. no accessory muscle use, no dullness to percussion  CARD:  RRR, no m/r/g, no peripheral edema, pulses intact, no cyanosis or clubbing.  GI:   Soft & nt; nml bowel sounds; no organomegaly or masses detected.   Musco: Warm bil, no deformities or joint swelling noted.   Neuro: alert, no focal deficits noted.    Skin: Warm, no lesions or rashes    Lab Results:  CBC  BMET  BNP No results found for: BNP  ProBNP No results found for: PROBNP  Imaging: No results found.   Assessment & Plan:   OSA on CPAP Needs improved CPAP compliance  Control Rhinitis sx   Plan  Patient Instructions  Try to use CPAP each night. Try to get at least 4 hours on CPAP each night. Work on healthy weight Do not drive a sleepy Continue on Claritin and Singular daily  Add Saline nasal rinses Twice daily  As needed   Continue on nasonex and Astelin nasal  Add Chlortrimeton 4mg  At bedtime  .  Follow-up in 6 months and as needed with Dr. Halford Chessman       RHINITIS, [place] coverage   Plan  Patient Instructions  Try to use CPAP each night. Try to get at least 4 hours on CPAP each night. Work on healthy weight Do not drive a sleepy Continue on Claritin and Singular daily  Add Saline nasal rinses Twice daily  As needed   Continue on nasonex and Astelin nasal  Add Chlortrimeton 4mg  At bedtime  .  Follow-up in 6 months and as needed with Dr. Halford Chessman  Rexene Edison, NP 06/13/2017

## 2017-06-13 NOTE — Patient Instructions (Addendum)
Try to use CPAP each night. Try to get at least 4 hours on CPAP each night. Work on healthy weight Do not drive a sleepy Continue on Claritin and Singular daily  Add Saline nasal rinses Twice daily  As needed   Continue on nasonex and Astelin nasal  Add Chlortrimeton 4mg  At bedtime  .  Follow-up in 6 months and as needed with Dr. Halford Chessman

## 2017-06-14 NOTE — Addendum Note (Signed)
Addended by: Parke Poisson E on: 06/14/2017 04:46 PM   Modules accepted: Orders

## 2017-06-16 DIAGNOSIS — R69 Illness, unspecified: Secondary | ICD-10-CM | POA: Diagnosis not present

## 2017-06-21 DIAGNOSIS — R69 Illness, unspecified: Secondary | ICD-10-CM | POA: Diagnosis not present

## 2017-06-21 DIAGNOSIS — N3010 Interstitial cystitis (chronic) without hematuria: Secondary | ICD-10-CM | POA: Diagnosis not present

## 2017-06-21 DIAGNOSIS — F411 Generalized anxiety disorder: Secondary | ICD-10-CM | POA: Diagnosis not present

## 2017-06-23 ENCOUNTER — Other Ambulatory Visit: Payer: Self-pay | Admitting: Family Medicine

## 2017-06-23 DIAGNOSIS — E559 Vitamin D deficiency, unspecified: Secondary | ICD-10-CM

## 2017-06-27 ENCOUNTER — Ambulatory Visit: Payer: Medicare HMO | Admitting: Allergy and Immunology

## 2017-06-29 ENCOUNTER — Ambulatory Visit: Payer: Self-pay | Admitting: Family Medicine

## 2017-06-30 DIAGNOSIS — F411 Generalized anxiety disorder: Secondary | ICD-10-CM | POA: Diagnosis not present

## 2017-06-30 DIAGNOSIS — R69 Illness, unspecified: Secondary | ICD-10-CM | POA: Diagnosis not present

## 2017-07-01 ENCOUNTER — Ambulatory Visit (INDEPENDENT_AMBULATORY_CARE_PROVIDER_SITE_OTHER): Payer: Medicare HMO | Admitting: Family Medicine

## 2017-07-01 ENCOUNTER — Encounter: Payer: Self-pay | Admitting: Family Medicine

## 2017-07-01 VITALS — BP 124/83 | HR 99 | Temp 98.4°F | Resp 12 | Ht 60.5 in | Wt 172.1 lb

## 2017-07-01 DIAGNOSIS — Z6832 Body mass index (BMI) 32.0-32.9, adult: Secondary | ICD-10-CM

## 2017-07-01 DIAGNOSIS — E6609 Other obesity due to excess calories: Secondary | ICD-10-CM

## 2017-07-01 DIAGNOSIS — R7989 Other specified abnormal findings of blood chemistry: Secondary | ICD-10-CM

## 2017-07-01 DIAGNOSIS — R945 Abnormal results of liver function studies: Secondary | ICD-10-CM | POA: Diagnosis not present

## 2017-07-01 DIAGNOSIS — J310 Chronic rhinitis: Secondary | ICD-10-CM | POA: Diagnosis not present

## 2017-07-01 DIAGNOSIS — E782 Mixed hyperlipidemia: Secondary | ICD-10-CM

## 2017-07-01 LAB — LIPID PANEL
CHOLESTEROL: 163 mg/dL (ref 0–200)
HDL: 75.2 mg/dL (ref >39.00)
LDL Cholesterol: 70 mg/dL (ref 0–99)
NonHDL: 88.01
TRIGLYCERIDES: 88 mg/dL (ref 0.0–149.0)
Total CHOL/HDL Ratio: 2
VLDL: 17.6 mg/dL (ref 0.0–40.0)

## 2017-07-01 LAB — HEPATIC FUNCTION PANEL
ALBUMIN: 4.4 g/dL (ref 3.5–5.2)
ALK PHOS: 90 U/L (ref 39–117)
ALT: 22 U/L (ref 0–35)
AST: 24 U/L (ref 0–37)
Bilirubin, Direct: 0.1 mg/dL (ref 0.0–0.3)
Total Bilirubin: 0.7 mg/dL (ref 0.2–1.2)
Total Protein: 6.9 g/dL (ref 6.0–8.3)

## 2017-07-01 NOTE — Patient Instructions (Signed)
A few things to remember from today's visit:   Abnormal liver function test  Mixed hyperlipidemia   We have ordered labs or studies at this visit.  It can take up to 1-2 weeks for results and processing. IF results require follow up or explanation, we will call you with instructions. Clinically stable results will be released to your [facility]. If you have not heard from [place] or cannot find your results in [facility] in 2 weeks please contact our office at [phone].  If you are not yet signed up for [facility], please consider signing up  Please be sure medication list is accurate. If a new problem present, please set up appointment sooner than planned today.

## 2017-07-01 NOTE — Progress Notes (Signed)
HPI:   [name] is a 62 y.o. female, who is here today for 3-4 months follow up.   She was last seen on 04/04/17.    Allergic rhinitis Still having symptoms, eye drainage and congestion. She is on Nasonex nasal spray,Singulair 10 mg daily, Loratadine 10 mg, and Benadryl prn. She also takes Hydroxyzine.  She has appt with immunologist in 08/01/17.   Hyperlipidemia  She is on Crestor 20 mg daily. She is also following low fat diet but not consistently. Breakfast:Banana + coffee + nuts.  Lunch: Carrots and mushrooms +chicken salad. Dinner: Chicken salad.    Walking 1-2 per week, chair yoga once per week. Planning on silver's sneakers.   Lab Results  Component Value Date   CHOL 244 (H) 09/14/2016   HDL 59.90 09/14/2016   LDLCALC 159 (H) 03/11/2016   LDLDIRECT 143.0 09/14/2016   TRIG 222.0 (H) 09/14/2016   CHOLHDL 4 09/14/2016    Abnormal liver function test  Lab Results  Component Value Date   ALT 11 09/14/2016   AST 13 09/14/2016   ALKPHOS 128 (H) 09/14/2016   BILITOT 0.6 09/14/2016   No abdominal pain,nausea,or vomiting.   Review of Systems  Constitutional: Negative for activity change, appetite change, fatigue and fever.  HENT: Positive for congestion. Negative for mouth sores, nosebleeds, sore throat and trouble swallowing.   Eyes: Positive for discharge. Negative for redness and visual disturbance.  Respiratory: Negative for cough, shortness of breath and wheezing.   Cardiovascular: Negative for chest pain, palpitations and leg swelling.  Gastrointestinal: Negative for abdominal pain, nausea and vomiting.       Negative for changes in bowel habits.  Genitourinary: Negative for decreased urine volume and hematuria.  Musculoskeletal: Negative for gait problem and myalgias.  Skin: Negative for rash.  Neurological: Negative for syncope, weakness and headaches.      Current Outpatient Medications on File Prior to Visit  Medication Sig  Dispense Refill  . albuterol (PROVENTIL HFA;VENTOLIN HFA) 108 (90 Base) MCG/ACT inhaler Inhale 1-2 puffs into the lungs daily as needed for wheezing or shortness of breath. (Patient taking differently: Inhale 2 puffs into the lungs every 6 (six) hours as needed for wheezing or shortness of breath. ) 54 Inhaler 3  . alendronate (FOSAMAX) 70 MG tablet TAKE 1 TABLET BY MOUTH EVERY 7 DAYS WITH FULL GLASS OF WATER ON EMPTY STOMACH 12 tablet 2  . amitriptyline (ELAVIL) 10 MG tablet TAKE 1 TABLET BY MOUTH EVERYDAY AT BEDTIME  1  . anti-nausea (EMETROL) solution Take 30 mLs by mouth every 15 (fifteen) minutes as needed for nausea or vomiting.    . Azelastine HCl 0.15 % SOLN PLACE 2 SPRAYS IN BOTH NOSTRILS TWICE DAILY (Patient taking differently: Place 2 sprays into the nose every morning. PLACE 2 SPRAYS IN BOTH NOSTRILS TWICE DAILY) 30 mL 3  . budesonide-formoterol (SYMBICORT) 80-4.5 MCG/ACT inhaler Inhale 2 puffs into the lungs 2 (two) times daily. (Patient taking differently: Inhale 1 puff into the lungs 2 (two) times daily. ) 1 Inhaler 5  . CVS D3 5000 units capsule TAKE 2 TABLETS BY MOUTH EVERY DAY 60 capsule 0  . diphenhydrAMINE (BENADRYL) 25 mg capsule Take 25 mg by mouth at bedtime as needed for sleep.    Marland Kitchen ELMIRON 100 MG capsule TAKE ONE CAPSULE EVERY DAY FOR 1 WEEK,THEN TAKE ONE CAPSULE TWICE A DAY  1  . estradiol (ESTRACE) 0.1 MG/GM vaginal cream Place 1 Applicatorful vaginally 3 (three) times  a week. (Patient taking differently: Place 1 Applicatorful once a week vaginally. ) 42.5 g 6  . gabapentin (NEURONTIN) 300 MG capsule Take 300 mg by mouth 3 (three) times daily.     . hydrocortisone cream 1 % Apply 1 application topically 2 (two) times daily as needed for itching.     . hydrOXYzine (VISTARIL) 25 MG capsule TAKE 1 CAPSULE BY MOUTH THREE TIMES A DAY AS NEEDED  2  . ibuprofen (ADVIL,MOTRIN) 200 MG tablet Take 200-800 mg by mouth every 6 (six) hours as needed for headache or moderate pain. Depends  on pain    . levothyroxine (SYNTHROID, LEVOTHROID) 50 MCG tablet TAKE 1 TABLET EVERY DAY (Patient taking differently: TAKE 1 TABLET EVERY DAY--- takes in am) 90 tablet 1  . loperamide (IMODIUM A-D) 2 MG tablet Take 2 mg by mouth 4 (four) times daily as needed for diarrhea or loose stools.    Marland Kitchen loratadine (CLARITIN) 10 MG tablet Take 1 tablet (10 mg total) by mouth daily. (Patient taking differently: Take 10 mg by mouth every morning. ) 30 tablet 2  . mometasone (NASONEX) 50 MCG/ACT nasal spray Place 2 sprays into the nose every morning.    . montelukast (SINGULAIR) 10 MG tablet Take 1 tablet (10 mg total) by mouth at bedtime. (Patient taking differently: Take 10 mg by mouth at bedtime. ) 30 tablet 10  . naproxen sodium (ANAPROX) 220 MG tablet Take 4 tablets (880 mg total) by mouth 2 (two) times daily as needed (headache). 60 tablet 5  . rizatriptan (MAXALT-MLT) 10 MG disintegrating tablet Take 1 tablet (10 mg total) by mouth daily as needed for migraine. May repeat in 2 hours if needed. Do not exceed 2 doses in 1 week period 9 tablet 3  . rosuvastatin (CRESTOR) 20 MG tablet Take 1 tablet (20 mg total) by mouth daily. (Patient taking differently: Take 20 mg by mouth every morning. ) 90 tablet 1  . sertraline (ZOLOFT) 100 MG tablet Take 2 tablets (200 mg total) by mouth daily. (Patient taking differently: Take 100 mg by mouth at bedtime. ) 14 tablet 0  . Spacer/Aero-Holding Chambers (AEROCHAMBER PLUS) inhaler Use as instructed 1 each 0  . ziprasidone (GEODON) 20 MG capsule Take 20-40 mg by mouth 2 (two) times daily with a meal. 20 mg at noon and 40 mg in the evening  2   No current facility-administered medications on file prior to visit.      Past Medical History:  Diagnosis Date  . Allergic rhinitis   . Benign essential tremor    hands  . Bipolar 1 disorder, mixed, moderate ([place])   . Bladder pain   . Claustrophobia   . Depression   . History of frequent urinary tract infections   . History  of gastroesophageal reflux (GERD)    05-25-2017  per pt no issues since hiatal hernia repair 01/ 2018  . History of hiatal hernia   . History of melanoma excision    early 2000s-- BACK  . History of panic attacks   . 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 Hypothyroidism   . Interstitial cystitis   . Limited jaw range of motion    s/p  bilateral TMJ surgery,  age 68s  . Lower urinary tract symptoms (LUTS)   . Migraines   . Mild asthma   . OA (osteoarthritis)   . OSA on CPAP    per last sleep study 09/ 2017  mild OSA, AHI13/hr  . PONV (postoperative nausea and vomiting)   . Rosacea   . Sensation of pressure in bladder area   . TMJ arthralgia   . Wears glasses   . White coat syndrome without hypertension    05-25-2017  per pt hx hypertension yrs ago, no issues after quitting stressful job   Allergies  Allergen Reactions  . Amlodipine Swelling    Peripheral edema  . Pseudoephedrine Other (See Comments)    I fly off the walls   . Risperidone And Related Other (See Comments)    Stomach upset, insomnia, drooling, tremors, "jerks," sensitivity to touch.   . Propoxyphene Itching    DARVOCET  . Sulfa Antibiotics Other (See Comments)    Unknown reaction  . Talwin [Pentazocine]     Unknown reaction  . Aspirin Other (See Comments)    REACTION: upset stomach  . Bupivacaine Hcl Hives  . Codeine Itching  . Etodolac Rash  . Indocin [Indomethacin] Other (See Comments)    Muscle spasms  . Ivp Dye [Iodinated Diagnostic Agents] Itching    Flushed and Fever, itch all over  . Pentazocine Lactate Itching    oozing  . Propranolol Nausea Only  . Tramadol Hcl Other (See Comments)    unknown    Social History   Socioeconomic History  . Marital status: Divorced    Spouse name: Not on file  . Number of children: 0  . Years of education: Not on file  . Highest education level: Not on file  Occupational History  . Not on file  Social Needs  . Financial  resource strain: Not on file  . Food insecurity:    Worry: Not on file    Inability: Not on file  . Transportation needs:    Medical: Not on file    Non-medical: Not on file  Tobacco Use  . Smoking status: Never Smoker  . Smokeless tobacco: Never Used  Substance and Sexual Activity  . Alcohol use: Yes    Alcohol/week: 0.0 oz    Comment: once every 3-4 months  . Drug use: No  . Sexual activity: Not on file  Lifestyle  . Physical activity:    Days per week: Not on file    Minutes per session: Not on file  . Stress: Not on file  Relationships  . Social connections:    Talks on phone: Not on file    Gets together: Not on file    Attends religious service: Not on file    Active member of club or organization: Not on file    Attends meetings of clubs or organizations: Not on file    Relationship status: Not on file  Other Topics Concern  . Not on file  Social History Narrative  . Not on file    Vitals:   07/01/17 1152  BP: 124/83  Pulse: 99  Resp: 12  Temp: 98.4 F (36.9 C)  SpO2: 97%   Body mass index is 33.06 kg/m.  Wt Readings from Last 3 Encounters:  07/01/17 172 lb 2 oz (78.1 kg)  06/13/17 172 lb (78 kg)  06/02/17 171 lb 8 oz (77.8 kg)     Physical Exam  Nursing note and vitals reviewed. Constitutional: She is oriented to person, place, and time. She appears well-developed. No distress.  HENT:  Head: Normocephalic and atraumatic.  Mouth/Throat: Oropharynx is clear and moist and mucous membranes are normal.  Eyes: Pupils are equal, round, and reactive to light. Conjunctivae  are normal. No scleral icterus.  Cardiovascular: Normal rate and regular rhythm.  No murmur heard. Pulses:      Dorsalis pedis pulses are 2+ on the right side, and 2+ on the left side.  Respiratory: Effort normal and breath sounds normal. No respiratory distress.  GI: Soft. She exhibits no mass. There is no hepatomegaly. There is no tenderness.  Musculoskeletal: She exhibits no  edema.  Lymphadenopathy:    She has no cervical adenopathy.  Neurological: She is alert and oriented to person, place, and time. She has normal strength. Gait normal.  Skin: Skin is warm. No erythema.  Psychiatric: She has a normal mood and affect.  Well groomed, good eye contact.     ASSESSMENT AND PLAN:   [name] was seen today for 3-4 months follow-up.  Orders Placed This Encounter  Procedures  . Lipid panel  . Hepatic function panel   Lab Results  Component Value Date   ALT 22 07/01/2017   AST 24 07/01/2017   ALKPHOS 90 07/01/2017   BILITOT 0.7 07/01/2017   Lab Results  Component Value Date   CHOL 163 07/01/2017   HDL 75.20 07/01/2017   LDLCALC 70 07/01/2017   LDLDIRECT 143.0 09/14/2016   TRIG 88.0 07/01/2017   CHOLHDL 2 07/01/2017    1. Mixed hyperlipidemia  No changes in current management, will follow labs done today and will give further recommendations accordingly. Low fat diet also recommended.  - Lipid panel  2. Abnormal liver function test  Mild. Further recommendations will be given according to lab results.  - Hepatic function panel  3. RHINITIS, CHRONIC  Not well controlled. She will continue following with immunologist.  4. Class 1 obesity due to excess calories without serious comorbidity with body mass index (BMI) of 32.0 to 32.9 in adult  We discussed benefits of wt loss as well as adverse effects of obesity. Consistency with healthy diet and physical activity recommended.     Taraya Steward G. [place], MD  [facility]. [place] office.

## 2017-07-02 ENCOUNTER — Encounter: Payer: Self-pay | Admitting: Family Medicine

## 2017-07-06 NOTE — Progress Notes (Signed)
Subjective:   [name] was seen in consultation in the movement disorder clinic at the request of [place], Malka So, MD.  The evaluation is for tremor.  The patient is a 62 y.o. left handed female with a history of tremor.  Pt reports that tremor started many years ago; she recalls that intermittently throughout her life she would be tremulous when she was stressed or singing in crowds or doing pageants as a child.  However, since she has gotten older it has gotten worse and even if she is the "least bit anxious" she will be tremulous and it may last for days.  She states that some days she will be more tremulous more than others.  There is no known family hx of tremor (thinks that sister may but she doesn't know).  States that her paternal GM had PD.  She was admitted to psychiatry at the end of 2013-2014 to behavioral health and c/o tremor.  She was told that her tremor was due to stress/psychogenic according to the patient.  I cannot find notes about this but did find notes about tremor.  Affected by caffeine:  Unknown (rarely drinks caffeine) Affected by alcohol:  Rarely drinks Affected by stress:  Yes.   Affected by fatigue:  No., not unless very fatigued Spills soup if on spoon:  Yes.   (and has trouble eating peas) Spills glass of liquid if full:  Yes.  , if glass is too full Affects ADL's (tying shoes, brushing teeth, etc):  No., although will note tremor with zipping zippers and with measuring food when baking)  Current/Previously tried tremor medications: primidone (put on it by PA at [facility] per the patient - helped sometimes but not all the time - ran out of the medication and so is now off of it)  Current medications that may exacerbate tremor:  Lithium/VPA/geodon/albuterol  (albuterol will make her tremor so doesn't use it often; been on lithium since 1987; been on geodon for 3 months for hallucinations - visual and auditory)  07/08/17 update: Patient is  seen today in follow-up.  I have not seen her since 2017.  At that point, she was on multiple medications that can exacerbate tremor.  She was started on propranolol as needed.  I did ask her to follow-up with me in 6 months, but she did not return until today.  Records are reviewed.  She is off of lithium, but she is still on Geodon. Didn't notice a difference when getting off of lithium.  She states that the tremors interfere with her doing things.  She can awaken with them.  She believes she had "a problem"  with propranolol but cannot recall what the SE was.  Trouble eating, writing.   On primidone years ago and it seemed to help some, but when she ran out of it she did not take it any longer.  She recently saw the nurse practitioner at the pulmonary practice and was noncompliant with CPAP at the time.  Outside reports reviewed: historical medical records, lab reports and referral letter/letters.  Allergies  Allergen Reactions  . Amlodipine Swelling    Peripheral edema  . Pseudoephedrine Other (See Comments)    I fly off the walls   . Risperidone And Related Other (See Comments)    Stomach upset, insomnia, drooling, tremors, "jerks," sensitivity to touch.   . Propoxyphene Itching    DARVOCET  . Sulfa Antibiotics Other (See Comments)    Unknown reaction  .  Talwin [Pentazocine]     Unknown reaction  . Aspirin Other (See Comments)    REACTION: upset stomach  . Bupivacaine Hcl Hives  . Codeine Itching  . Etodolac Rash  . Indocin [Indomethacin] Other (See Comments)    Muscle spasms  . Ivp Dye [Iodinated Diagnostic Agents] Itching    Flushed and Fever, itch all over  . Pentazocine Lactate Itching    oozing  . Propranolol Nausea Only  . Tramadol Hcl Other (See Comments)    unknown    Outpatient Encounter Medications as of 07/08/2017  Medication Sig  . albuterol (PROVENTIL HFA;VENTOLIN HFA) 108 (90 Base) MCG/ACT inhaler Inhale 1-2 puffs into the lungs daily as needed for wheezing or  shortness of breath. (Patient taking differently: Inhale 2 puffs into the lungs every 6 (six) hours as needed for wheezing or shortness of breath. )  . alendronate (FOSAMAX) 70 MG tablet TAKE 1 TABLET BY MOUTH EVERY 7 DAYS WITH FULL GLASS OF WATER ON EMPTY STOMACH  . amitriptyline (ELAVIL) 10 MG tablet TAKE 1 TABLET BY MOUTH EVERYDAY AT BEDTIME  . anti-nausea (EMETROL) solution Take 30 mLs by mouth every 15 (fifteen) minutes as needed for nausea or vomiting.  . Azelastine HCl 0.15 % SOLN PLACE 2 SPRAYS IN BOTH NOSTRILS TWICE DAILY (Patient taking differently: Place 2 sprays into the nose every morning. PLACE 2 SPRAYS IN BOTH NOSTRILS TWICE DAILY)  . budesonide-formoterol (SYMBICORT) 80-4.5 MCG/ACT inhaler Inhale 2 puffs into the lungs 2 (two) times daily. (Patient taking differently: Inhale 1 puff into the lungs 2 (two) times daily. )  . [employer] D3 5000 units capsule TAKE 2 TABLETS BY MOUTH EVERY DAY  . diphenhydrAMINE (BENADRYL) 25 mg capsule Take 25 mg by mouth at bedtime as needed for sleep.  Marland Kitchen ELMIRON 100 MG capsule TAKE ONE CAPSULE EVERY DAY FOR 1 WEEK,THEN TAKE ONE CAPSULE TWICE A DAY  . estradiol (ESTRACE) 0.1 MG/GM vaginal cream Place 1 Applicatorful vaginally 3 (three) times a week. (Patient taking differently: Place 1 Applicatorful once a week vaginally. )  . gabapentin (NEURONTIN) 300 MG capsule Take 300 mg by mouth 3 (three) times daily.   . hydrocortisone cream 1 % Apply 1 application topically 2 (two) times daily as needed for itching.   . hydrOXYzine (VISTARIL) 25 MG capsule TAKE 1 CAPSULE BY MOUTH THREE TIMES A DAY AS NEEDED  . ibuprofen (ADVIL,MOTRIN) 200 MG tablet Take 200-800 mg by mouth every 6 (six) hours as needed for headache or moderate pain. Depends on pain  . levothyroxine (SYNTHROID, LEVOTHROID) 50 MCG tablet TAKE 1 TABLET EVERY DAY (Patient taking differently: TAKE 1 TABLET EVERY DAY--- takes in am)  . loperamide (IMODIUM A-D) 2 MG tablet Take 2 mg by mouth 4 (four) times daily  as needed for diarrhea or loose stools.  Marland Kitchen loratadine (CLARITIN) 10 MG tablet Take 1 tablet (10 mg total) by mouth daily. (Patient taking differently: Take 10 mg by mouth every morning. )  . mometasone (NASONEX) 50 MCG/ACT nasal spray Place 2 sprays into the nose every morning.  . montelukast (SINGULAIR) 10 MG tablet Take 1 tablet (10 mg total) by mouth at bedtime. (Patient taking differently: Take 10 mg by mouth at bedtime. )  . naproxen sodium (ANAPROX) 220 MG tablet Take 4 tablets (880 mg total) by mouth 2 (two) times daily as needed (headache).  . rizatriptan (MAXALT-MLT) 10 MG disintegrating tablet Take 1 tablet (10 mg total) by mouth daily as needed for migraine. May repeat in 2 hours  if needed. Do not exceed 2 doses in 1 week period  . rosuvastatin (CRESTOR) 20 MG tablet Take 1 tablet (20 mg total) by mouth daily. (Patient taking differently: Take 20 mg by mouth every morning. )  . sertraline (ZOLOFT) 100 MG tablet Take 2 tablets (200 mg total) by mouth daily. (Patient taking differently: Take 100 mg by mouth at bedtime. )  . Spacer/Aero-Holding Chambers (AEROCHAMBER PLUS) inhaler Use as instructed  . ziprasidone (GEODON) 20 MG capsule Take 20-40 mg by mouth 2 (two) times daily with a meal. 20 mg at noon and 40 mg in the evening   No facility-administered encounter medications on file as of 07/08/2017.     Past Medical History:  Diagnosis Date  . Allergic rhinitis   . Benign essential tremor    hands  . Bipolar 1 disorder, mixed, moderate ([place])   . Bladder pain   . Claustrophobia   . Depression   . History of frequent urinary tract infections   . History of gastroesophageal reflux (GERD)    05-25-2017  per pt no issues since hiatal hernia repair 01/ 2018  . History of hiatal hernia   . History of melanoma excision    early 2000s-- BACK  . History of panic attacks   . 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 Hypothyroidism   . Interstitial cystitis   .  Limited jaw range of motion    s/p  bilateral TMJ surgery,  age 68s  . Lower urinary tract symptoms (LUTS)   . Migraines   . Mild asthma   . OA (osteoarthritis)   . OSA on CPAP    per last sleep study 09/ 2017  mild OSA, AHI13/hr  . PONV (postoperative nausea and vomiting)   . Rosacea   . Sensation of pressure in bladder area   . TMJ arthralgia   . Wears glasses   . White coat syndrome without hypertension    05-25-2017  per pt hx hypertension yrs ago, no issues after quitting stressful job    Past Surgical History:  Procedure Laterality Date  . [place] STUDY N/A 09/22/2015   Procedure: [place] STUDY;  Surgeon: Doran Stabler, MD;  Location: [facility];  Service: Gastroenterology;  Laterality: N/A;  . ANAL FISSURE REPAIR  age 37  (15)  . BREAST EXCISIONAL BIOPSY Right 04-16-2003  dr p. young  [place]   benign  . BUNIONECTOMY Right early 2000s  . CARPAL TUNNEL RELEASE Left age 24 (83)  . CHOLECYSTECTOMY OPEN  age 64  . CYSTO WITH HYDRODISTENSION N/A 06/02/2017   Procedure: CYSTOSCOPY/HYDRODISTENSION OF BLADDER;  Surgeon: Irine Seal, MD;  Location: [facility];  Service: Urology;  Laterality: N/A;  . CYSTO/ HYDRODISTENTION/  INSTILLATION THERAPY  1990s  . DX LAPAROSCOPY/  DX HYSTEROSCOPY/  D & C  08-07-2007   dr dove  [facility]  . ENDOMETRIAL ABLATION W/ NOVASURE  10-21-2008   dr Harolyn Rutherford  [facility]  . ESOPHAGEAL MANOMETRY N/A 09/22/2015   Procedure: ESOPHAGEAL MANOMETRY (EM);  Surgeon: Doran Stabler, MD;  Location: [facility];  Service: Gastroenterology;  Laterality: N/A;  with impedence   . FINGER ARTHROPLASTY Bilateral    left little finger 2016:  right middle finger 06/ 2018  . KNEE ARTHROSCOPY Bilateral x3 left ;  x3  right-- last one age 54 (80)  . NASAL SEPTUM SURGERY  age 75s  . ROBOT ASSISTED REDUCTION PARAESOPHAGEAL HIATAL HERNIA/  TYPE 2 MEDIASTINAL DISSECTION/ PRIMARY HIATAL HERNIA REPAIR/ ANTERIOR & POSTERIOR GASTROPEXY/ NISSEN FUNDOPLICATION   [id number]   DR GROSS  [facility]  . TEMPOROMANDIBULAR JOINT SURGERY Bilateral x3  -- age 40s   per pt used graft  . TONSILLECTOMY  age 63 (56)  . TRIGGER FINGER RELEASE Bilateral last one 2017   several release's bilaterally  . ULNAR NERVE TRANSPOSITION Bilateral age 51 (51)    Social History   Socioeconomic History  . Marital status: Divorced    Spouse name: Not on file  . Number of children: 0  . Years of education: Not on file  . Highest education level: Not on file  Occupational History  . Not on file  Social Needs  . Financial resource strain: Not on file  . Food insecurity:    Worry: Not on file    Inability: Not on file  . Transportation needs:    Medical: Not on file    Non-medical: Not on file  Tobacco Use  . Smoking status: Never Smoker  . Smokeless tobacco: Never Used  Substance and Sexual Activity  . Alcohol use: Yes    Alcohol/week: 0.0 oz    Comment: once every 3-4 months  . Drug use: No  . Sexual activity: Not on file  Lifestyle  . Physical activity:    Days per week: Not on file    Minutes per session: Not on file  . Stress: Not on file  Relationships  . Social connections:    Talks on phone: Not on file    Gets together: Not on file    Attends religious service: Not on file    Active member of club or organization: Not on file    Attends meetings of clubs or organizations: Not on file    Relationship status: Not on file  . Intimate partner violence:    Fear of current or ex partner: Not on file    Emotionally abused: Not on file    Physically abused: Not on file    Forced sexual activity: Not on file  Other Topics Concern  . Not on file  Social History Narrative  . Not on file    Family Status  Relation Name Status  . Mother  Deceased       HTN, thyroid  . Father  Deceased       testicular cancer  . Sister sally Alive       colon polyps  . Unknown  (Not Specified)    Review of Systems Review of Systems  Constitutional: Negative.    HENT: Negative.   Respiratory:       Feels hard to get deep breath since sx for hiatal hernia   Genitourinary: Negative.   Musculoskeletal: Negative.   Skin: Negative.   Neurological:       Some trouble with paresthesias of the right thumb, pinky and pointer finger     Objective:   VITALS:   Vitals:   07/08/17 1039  BP: 108/78  Pulse: 94  SpO2: 95%  Weight: 175 lb (79.4 kg)  Height: 5' 0.5" (1.537 m)    Gen:  Appears stated age and in NAD. HEENT:  Normocephalic, atraumatic. The mucous membranes are moist. The superficial temporal arteries are without ropiness or tenderness. Cardiovascular: Regular rate and rhythm. Lungs: Clear to auscultation bilaterally. Neck: There are no carotid bruits noted bilaterally.  NEUROLOGICAL:  Orientation:  The patient is alert and oriented x 3.   Cranial nerves: There is  good facial symmetry. The pupils are equal round and reactive to light bilaterally. Fundoscopic exam reveals clear disc margins bilaterally. Extraocular muscles are intact and visual fields are full to confrontational testing. Speech is fluent and clear. Soft palate rises symmetrically and there is no tongue deviation. Hearing is intact to conversational tone. Tone: Tone is good throughout. Sensation: Sensation is intact to light touch.  Vibration intact at the bilateral big toe Motor: Strength is 5/5 in the bilateral upper and lower extremities.  Shoulder shrug is equal bilaterally.  There is no pronator drift.  There are no fasciculations noted. DTR's: Deep tendon reflexes are 2+-3/4 at the bilateral biceps, triceps, brachioradialis, patella and achilles.  Plantar responses are downgoing bilaterally. Gait and Station: The patient is able to ambulate without difficulty.   MOVEMENT EXAM: Tremor: There is mild postural tremor that increases with intention.  She has mild trouble with Archimedes spirals.  She does spill some water because of tremor when asked to pour water from  one glass to another, but she is also slightly careless when she performs the task.    Lab Results  Component Value Date   TSH 1.40 09/14/2016     Chemistry      Component Value Date/Time   NA 140 12/19/2016 1234   K 3.9 12/19/2016 1234   CL 107 12/19/2016 1234   CO2 20 (L) 12/19/2016 1234   BUN 8 12/19/2016 1234   CREATININE 0.90 12/19/2016 1234   CREATININE 1.00 11/21/2014 0959      Component Value Date/Time   CALCIUM 9.6 12/19/2016 1234   ALKPHOS 90 07/01/2017 1250   AST 24 07/01/2017 1250   ALT 22 07/01/2017 1250   BILITOT 0.7 07/01/2017 1250         Assessment/Plan:   1.Tremor.  -She is now off of lithium and Depakote, but still has Geodon present.  Explained to her that this too can cause tremor.  She really would like to try some medication for tremor.  She was on primidone in the past and would like to go back on that again.  Will increase to 50 mg twice per day.  Risks, benefits, side effects and alternative therapies were discussed.  The opportunity to ask questions was given and they were answered to the best of my ability.  The patient expressed understanding and willingness to follow the outlined treatment protocols.  -she asked me about possibility of dx of PD.  Explained that she doesn't meet [place] brain bank criteria for it.  Asks if she can be tested for it and explained that this is a clinical dx and she doesn't have the disease. 2.  Hypothyroidism  -being treated and last function was normal 3.  F/u 5 months.  Much greater than 50% of this visit was spent in counseling and coordinating care.  Total face to face time:  25 min

## 2017-07-08 ENCOUNTER — Encounter: Payer: Self-pay | Admitting: Neurology

## 2017-07-08 ENCOUNTER — Ambulatory Visit: Payer: Medicare HMO | Admitting: Neurology

## 2017-07-08 VITALS — BP 108/78 | HR 94 | Ht 60.5 in | Wt 175.0 lb

## 2017-07-08 DIAGNOSIS — R251 Tremor, unspecified: Secondary | ICD-10-CM | POA: Diagnosis not present

## 2017-07-08 MED ORDER — PRIMIDONE 50 MG PO TABS: 50.0000 mg | ORAL_TABLET | Freq: Two times a day (BID) | ORAL | 1 refills | Status: DC

## 2017-07-08 NOTE — Patient Instructions (Signed)
Start primidone - 50 mg - 1/2 tablet at night for 4 nights, then 1 tablet at night for 2 weeks and then 1 tablet twice per day thereafter

## 2017-07-12 ENCOUNTER — Encounter: Payer: Self-pay | Admitting: Family Medicine

## 2017-07-12 ENCOUNTER — Ambulatory Visit (INDEPENDENT_AMBULATORY_CARE_PROVIDER_SITE_OTHER): Payer: Medicare HMO | Admitting: Family Medicine

## 2017-07-12 VITALS — BP 126/70 | HR 94 | Temp 98.3°F | Resp 16 | Ht 60.5 in | Wt 175.2 lb

## 2017-07-12 DIAGNOSIS — M170 Bilateral primary osteoarthritis of knee: Secondary | ICD-10-CM

## 2017-07-12 DIAGNOSIS — F3162 Bipolar disorder, current episode mixed, moderate: Secondary | ICD-10-CM | POA: Diagnosis not present

## 2017-07-12 DIAGNOSIS — R69 Illness, unspecified: Secondary | ICD-10-CM | POA: Diagnosis not present

## 2017-07-12 DIAGNOSIS — Z0271 Encounter for disability determination: Secondary | ICD-10-CM | POA: Diagnosis not present

## 2017-07-12 DIAGNOSIS — N3010 Interstitial cystitis (chronic) without hematuria: Secondary | ICD-10-CM | POA: Diagnosis not present

## 2017-07-12 NOTE — Progress Notes (Signed)
ACUTE VISIT   HPI:  Chief Complaint  Patient presents with  . Discuss disabiity paperwork    [name] is a 62 y.o. female, who is here today requesting disability form to be completed.   According to pt,she is on disability due to OA,IC, and bipolar disorder.  IC, she follows with urologist every 6 months, Dr Jeffie Pollock. S/P cystoscopy and hydrodistension of bladder.   Bipolar disorder , she sees psychiatrist regularly but states that they will not fill out paper because she just established with a new provider.   OA, mainly in knees. She has seen Dr Sheryle Spray, ortho. She needs to change to a different provider because insurance.  She drives, she has a handicap sticker.  She lives alone. She has a cane at home and uses it as needed. According to pt, TKR was recommended.    Review of Systems  Constitutional: Positive for fatigue. Negative for activity change, appetite change and fever.  Eyes: Negative for redness and visual disturbance.  Respiratory: Positive for apnea (OSA on CPAP). Negative for cough, shortness of breath and wheezing.   Cardiovascular: Negative for chest pain, palpitations and leg swelling.  Gastrointestinal: Negative for abdominal pain, nausea and vomiting.       Negative for changes in bowel habits.  Genitourinary: Negative for decreased urine volume and hematuria.  Musculoskeletal: Positive for arthralgias.  Skin: Negative for rash and wound.  Psychiatric/Behavioral: Positive for sleep disturbance. Negative for confusion, hallucinations and suicidal ideas. The patient is nervous/anxious.       Current Outpatient Medications on File Prior to Visit  Medication Sig Dispense Refill  . albuterol (PROVENTIL HFA;VENTOLIN HFA) 108 (90 Base) MCG/ACT inhaler Inhale 1-2 puffs into the lungs daily as needed for wheezing or shortness of breath. (Patient taking differently: Inhale 2 puffs into the lungs every 6 (six) hours as needed for wheezing or  shortness of breath. ) 54 Inhaler 3  . alendronate (FOSAMAX) 70 MG tablet TAKE 1 TABLET BY MOUTH EVERY 7 DAYS WITH FULL GLASS OF WATER ON EMPTY STOMACH 12 tablet 2  . amitriptyline (ELAVIL) 10 MG tablet TAKE 1 TABLET BY MOUTH EVERYDAY AT BEDTIME  1  . anti-nausea (EMETROL) solution Take 30 mLs by mouth every 15 (fifteen) minutes as needed for nausea or vomiting.    . Azelastine HCl 0.15 % SOLN PLACE 2 SPRAYS IN BOTH NOSTRILS TWICE DAILY (Patient taking differently: Place 2 sprays into the nose every morning. PLACE 2 SPRAYS IN BOTH NOSTRILS TWICE DAILY) 30 mL 3  . budesonide-formoterol (SYMBICORT) 80-4.5 MCG/ACT inhaler Inhale 2 puffs into the lungs 2 (two) times daily. (Patient taking differently: Inhale 1 puff into the lungs 2 (two) times daily. ) 1 Inhaler 5  . [employer] D3 5000 units capsule TAKE 2 TABLETS BY MOUTH EVERY DAY 60 capsule 0  . diphenhydrAMINE (BENADRYL) 25 mg capsule Take 25 mg by mouth at bedtime as needed for sleep.    Marland Kitchen ELMIRON 100 MG capsule TAKE ONE CAPSULE EVERY DAY FOR 1 WEEK,THEN TAKE ONE CAPSULE TWICE A DAY  1  . estradiol (ESTRACE) 0.1 MG/GM vaginal cream Place 1 Applicatorful vaginally 3 (three) times a week. (Patient taking differently: Place 1 Applicatorful once a week vaginally. ) 42.5 g 6  . gabapentin (NEURONTIN) 300 MG capsule Take 300 mg by mouth 3 (three) times daily.     . hydrocortisone cream 1 % Apply 1 application topically 2 (two) times daily as needed for itching.     Marland Kitchen  hydrOXYzine (VISTARIL) 25 MG capsule TAKE 1 CAPSULE BY MOUTH THREE TIMES A DAY AS NEEDED  2  . ibuprofen (ADVIL,MOTRIN) 200 MG tablet Take 200-800 mg by mouth every 6 (six) hours as needed for headache or moderate pain. Depends on pain    . levothyroxine (SYNTHROID, LEVOTHROID) 50 MCG tablet TAKE 1 TABLET EVERY DAY (Patient taking differently: TAKE 1 TABLET EVERY DAY--- takes in am) 90 tablet 1  . loperamide (IMODIUM A-D) 2 MG tablet Take 2 mg by mouth 4 (four) times daily as needed for diarrhea or  loose stools.    Marland Kitchen loratadine (CLARITIN) 10 MG tablet Take 1 tablet (10 mg total) by mouth daily. (Patient taking differently: Take 10 mg by mouth every morning. ) 30 tablet 2  . mometasone (NASONEX) 50 MCG/ACT nasal spray Place 2 sprays into the nose every morning.    . montelukast (SINGULAIR) 10 MG tablet Take 1 tablet (10 mg total) by mouth at bedtime. (Patient taking differently: Take 10 mg by mouth at bedtime. ) 30 tablet 10  . naproxen sodium (ANAPROX) 220 MG tablet Take 4 tablets (880 mg total) by mouth 2 (two) times daily as needed (headache). 60 tablet 5  . primidone (MYSOLINE) 50 MG tablet Take 1 tablet (50 mg total) by mouth 2 (two) times daily. 180 tablet 1  . rizatriptan (MAXALT-MLT) 10 MG disintegrating tablet Take 1 tablet (10 mg total) by mouth daily as needed for migraine. May repeat in 2 hours if needed. Do not exceed 2 doses in 1 week period 9 tablet 3  . rosuvastatin (CRESTOR) 20 MG tablet Take 1 tablet (20 mg total) by mouth daily. (Patient taking differently: Take 20 mg by mouth every morning. ) 90 tablet 1  . sertraline (ZOLOFT) 100 MG tablet Take 2 tablets (200 mg total) by mouth daily. (Patient taking differently: Take 100 mg by mouth at bedtime. ) 14 tablet 0  . Spacer/Aero-Holding Chambers (AEROCHAMBER PLUS) inhaler Use as instructed 1 each 0  . ziprasidone (GEODON) 20 MG capsule Take 20-40 mg by mouth 2 (two) times daily with a meal. 20 mg at noon and 40 mg in the evening  2   No current facility-administered medications on file prior to visit.      Past Medical History:  Diagnosis Date  . Allergic rhinitis   . Benign essential tremor    hands  . Bipolar 1 disorder, mixed, moderate ([place])   . Bladder pain   . Claustrophobia   . Depression   . History of frequent urinary tract infections   . History of gastroesophageal reflux (GERD)    05-25-2017  per pt no issues since hiatal hernia repair 01/ 2018  . History of hiatal hernia   . History of melanoma excision      early 2000s-- BACK  . History of panic attacks   . 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 Hypothyroidism   . Interstitial cystitis   . Limited jaw range of motion    s/p  bilateral TMJ surgery,  age 25s  . Lower urinary tract symptoms (LUTS)   . Migraines   . Mild asthma   . OA (osteoarthritis)   . OSA on CPAP    per last sleep study 09/ 2017  mild OSA, AHI13/hr  . PONV (postoperative nausea and vomiting)   . Rosacea   . Sensation of pressure in bladder area   . TMJ arthralgia   . Wears glasses   .  White coat syndrome without hypertension    05-25-2017  per pt hx hypertension yrs ago, no issues after quitting stressful job   Allergies  Allergen Reactions  . Amlodipine Swelling    Peripheral edema  . Pseudoephedrine Other (See Comments)    I fly off the walls   . Risperidone And Related Other (See Comments)    Stomach upset, insomnia, drooling, tremors, "jerks," sensitivity to touch.   . Propoxyphene Itching    DARVOCET  . Sulfa Antibiotics Other (See Comments)    Unknown reaction  . Talwin [Pentazocine]     Unknown reaction  . Aspirin Other (See Comments)    REACTION: upset stomach  . Bupivacaine Hcl Hives  . Codeine Itching  . Etodolac Rash  . Indocin [Indomethacin] Other (See Comments)    Muscle spasms  . Ivp Dye [Iodinated Diagnostic Agents] Itching    Flushed and Fever, itch all over  . Pentazocine Lactate Itching    oozing  . Propranolol Nausea Only  . Tramadol Hcl Other (See Comments)    unknown    Social History   Socioeconomic History  . Marital status: Divorced    Spouse name: Not on file  . Number of children: 0  . Years of education: Not on file  . Highest education level: Not on file  Occupational History  . Not on file  Social Needs  . Financial resource strain: Not on file  . Food insecurity:    Worry: Not on file    Inability: Not on file  . Transportation needs:    Medical: Not on file    Non-medical: Not  on file  Tobacco Use  . Smoking status: Never Smoker  . Smokeless tobacco: Never Used  Substance and Sexual Activity  . Alcohol use: Yes    Alcohol/week: 0.0 oz    Comment: once every 3-4 months  . Drug use: No  . Sexual activity: Not on file  Lifestyle  . Physical activity:    Days per week: Not on file    Minutes per session: Not on file  . Stress: Not on file  Relationships  . Social connections:    Talks on phone: Not on file    Gets together: Not on file    Attends religious service: Not on file    Active member of club or organization: Not on file    Attends meetings of clubs or organizations: Not on file    Relationship status: Not on file  Other Topics Concern  . Not on file  Social History Narrative  . Not on file    Vitals:   07/12/17 1047  BP: 126/70  Pulse: 94  Resp: 16  Temp: 98.3 F (36.8 C)  SpO2: 96%   Body mass index is 33.66 kg/m.    Physical Exam  Nursing note and vitals reviewed. Constitutional: She is oriented to person, place, and time. She appears well-developed. No distress.  HENT:  Head: Normocephalic and atraumatic.  Mouth/Throat: Oropharynx is clear and moist. Mucous membranes are dry.  Eyes: Pupils are equal, round, and reactive to light. Conjunctivae are normal.  Cardiovascular: Normal rate and regular rhythm.  No murmur heard. Respiratory: Effort normal and breath sounds normal. No respiratory distress.  GI: Soft. She exhibits no mass. There is no hepatomegaly. There is no tenderness.  Musculoskeletal: She exhibits no edema.  Crepitus knee bilateral,limitation of flexion and pain elicited. Left 5th finger fixed flexion PIP joint.  Some IP joints with Heberden's node  and Bouchard's nodes.   Lymphadenopathy:    She has no cervical adenopathy.  Neurological: She is alert and oriented to person, place, and time. She has normal strength. She displays tremor. Gait normal.  Skin: Skin is warm. No rash noted. No erythema.    Psychiatric: Her mood appears anxious.  Well groomed, good eye contact.    ASSESSMENT AND PLAN:   Ms. Ainslie was seen today for discuss disabiity paperwork.  Diagnoses and all orders for this visit:  Disability examination  Certification of disability signed.  Primary osteoarthritis of both knees  She has not decided about TKR. Continue following with ortho.  INTERSTITIAL CYSTITIS  Recently had cystoscopy with hydrodistension bladder. Continue following with Dr Jeffie Pollock.  Bipolar 1 disorder, mixed, moderate ([facility])  Stable. Continue following with psychiatrist.      Trai Ells G. Martin[place], MD  [facility]. [place] office.

## 2017-07-12 NOTE — Patient Instructions (Addendum)
A few things to remember from today's visit:   Disability examination  Primary osteoarthritis of both knees  INTERSTITIAL CYSTITIS  Bipolar 1 disorder, mixed, moderate (H[facility])   I certification of disability completed.  If further recommendation will require you may need to ask your urologist, orthopedist, and psychiatrist for documentation in regard to condition you are on disability for.    Please be sure medication list is accurate. If a new problem present, please set up appointment sooner than planned today.

## 2017-07-13 DIAGNOSIS — R69 Illness, unspecified: Secondary | ICD-10-CM | POA: Diagnosis not present

## 2017-07-13 DIAGNOSIS — F411 Generalized anxiety disorder: Secondary | ICD-10-CM | POA: Diagnosis not present

## 2017-07-21 DIAGNOSIS — R69 Illness, unspecified: Secondary | ICD-10-CM | POA: Diagnosis not present

## 2017-07-25 ENCOUNTER — Other Ambulatory Visit: Payer: Self-pay | Admitting: Family Medicine

## 2017-07-25 DIAGNOSIS — E039 Hypothyroidism, unspecified: Secondary | ICD-10-CM

## 2017-07-28 DIAGNOSIS — R69 Illness, unspecified: Secondary | ICD-10-CM | POA: Diagnosis not present

## 2017-08-01 ENCOUNTER — Ambulatory Visit: Payer: Medicare HMO | Admitting: Allergy and Immunology

## 2017-08-01 ENCOUNTER — Other Ambulatory Visit: Payer: Self-pay | Admitting: Allergy and Immunology

## 2017-08-01 ENCOUNTER — Encounter: Payer: Self-pay | Admitting: Allergy and Immunology

## 2017-08-01 VITALS — BP 140/76 | HR 100 | Temp 98.9°F | Resp 16 | Ht 59.0 in | Wt 173.6 lb

## 2017-08-01 DIAGNOSIS — H1010 Acute atopic conjunctivitis, unspecified eye: Secondary | ICD-10-CM | POA: Insufficient documentation

## 2017-08-01 DIAGNOSIS — J4540 Moderate persistent asthma, uncomplicated: Secondary | ICD-10-CM | POA: Diagnosis not present

## 2017-08-01 DIAGNOSIS — R05 Cough: Secondary | ICD-10-CM

## 2017-08-01 DIAGNOSIS — J3089 Other allergic rhinitis: Secondary | ICD-10-CM

## 2017-08-01 DIAGNOSIS — R053 Chronic cough: Secondary | ICD-10-CM

## 2017-08-01 DIAGNOSIS — H1013 Acute atopic conjunctivitis, bilateral: Secondary | ICD-10-CM

## 2017-08-01 MED ORDER — ALBUTEROL SULFATE HFA 108 (90 BASE) MCG/ACT IN AERS: 1.0000 | INHALATION_SPRAY | Freq: Every day | RESPIRATORY_TRACT | 1 refills | Status: DC | PRN

## 2017-08-01 MED ORDER — OLOPATADINE HCL 0.1 % OP SOLN: 1.0000 [drp] | Freq: Two times a day (BID) | OPHTHALMIC | 1 refills | Status: DC

## 2017-08-01 MED ORDER — IPRATROPIUM BROMIDE 0.06 % NA SOLN: 2.0000 | Freq: Three times a day (TID) | NASAL | 1 refills | Status: DC | PRN

## 2017-08-01 MED ORDER — CARBINOXAMINE MALEATE 4 MG PO TABS: 4.0000 mg | ORAL_TABLET | Freq: Three times a day (TID) | ORAL | 1 refills | Status: DC | PRN

## 2017-08-01 MED ORDER — TIOTROPIUM BROMIDE MONOHYDRATE 1.25 MCG/ACT IN AERS: 2.0000 | INHALATION_SPRAY | Freq: Every day | RESPIRATORY_TRACT | 1 refills | Status: DC

## 2017-08-01 MED ORDER — FLUTICASONE PROPIONATE HFA 220 MCG/ACT IN AERO: 2.0000 | INHALATION_SPRAY | Freq: Two times a day (BID) | RESPIRATORY_TRACT | 1 refills | Status: DC

## 2017-08-01 NOTE — Assessment & Plan Note (Signed)
The most common causes of chronic cough include the following: upper airway cough syndrome (UACS) which is caused by variety of rhinosinus conditions; asthma; gastroesophageal reflux disease (GERD); chronic bronchitis from cigarette smoking or other inhaled environmental irritants; non-asthmatic eosinophilic bronchitis; and bronchiectasis. In prospective studies, these conditions have accounted for up to 94% of the causes of chronic cough in immunocompetent adults. The history and physical examination suggest that her cough is multifactorial with contribution from postnasal drainage and bronchial hyperresponsiveness. We will address these issues at this time.   Treatment plan as outlined above.    We will regroup in 8 weeks to assess treatment response and adjust therapy accordingly.

## 2017-08-01 NOTE — Assessment & Plan Note (Addendum)
   Aeroallergen avoidance measures have been discussed and provided in written form.  A prescription has been provided for carbinoxamine 4 mg every 8 hours if needed.  A prescription has been provided for ipratropium 0.06% nasal spray, 1-2 sprays per nostril every 8 hours as needed. Proper nasal spray technique has been discussed and demonstrated.   Continue azelastine nasal spray, 1 to 2 sprays per nostril 2 times daily if needed.  Nasal saline lavage (NeilMed) has been recommended as needed and prior to medicated nasal sprays along with instructions for proper administration.  Discontinue phenylephrine.  If allergen avoidance measures and medications fail to adequately relieve symptoms, aeroallergen immunotherapy will be considered.

## 2017-08-01 NOTE — Assessment & Plan Note (Addendum)
   Due to perceived side effects, discontinue Symbicort.  A prescription has been provided for Flovent (fluticasone) 220 g, 2 inhalations via spacer device twice a day.  To maximize pulmonary deposition, a spacer has been provided along with instructions for its proper administration with an HFA inhaler.  A prescription has been provided for Spiriva Respimat 1.25 g, 2 inhalations daily.  Continue montelukast 10 mg daily bedtime and albuterol every 6 hours if needed.  Subjective and objective measures of pulmonary function will be followed and the treatment plan will be adjusted accordingly.

## 2017-08-01 NOTE — Assessment & Plan Note (Signed)
   Treatment plan as outlined above for allergic rhinitis.  A prescription has been provided for Patanol, one drop per eye twice daily as needed.  I have also recommended eye lubricant drops (i.e., Natural Tears) as needed.

## 2017-08-01 NOTE — Patient Instructions (Addendum)
Perennial allergic rhinitis  Aeroallergen avoidance measures have been discussed and provided in written form.  A prescription has been provided for carbinoxamine 4 mg every 8 hours if needed.  A prescription has been provided for ipratropium 0.06% nasal spray, 1-2 sprays per nostril every 8 hours as needed. Proper nasal spray technique has been discussed and demonstrated.   Continue azelastine nasal spray, 1 to 2 sprays per nostril 2 times daily if needed.  Nasal saline lavage (NeilMed) has been recommended as needed and prior to medicated nasal sprays along with instructions for proper administration.  Discontinue phenylephrine.  If allergen avoidance measures and medications fail to adequately relieve symptoms, aeroallergen immunotherapy will be considered.  Allergic conjunctivitis  Treatment plan as outlined above for allergic rhinitis.  A prescription has been provided for Patanol, one drop per eye twice daily as needed.  I have also recommended eye lubricant drops (i.e., Natural Tears) as needed.  Moderate persistent asthma  Due to perceived side effects, discontinue Symbicort.  A prescription has been provided for Flovent (fluticasone) 220 g,  2 inhalations via spacer device twice a day.  To maximize pulmonary deposition, a spacer has been provided along with instructions for its proper administration with an HFA inhaler.  A prescription has been provided for Spiriva Respimat 1.25 g, 2 inhalations daily.  Continue montelukast 10 mg daily bedtime and albuterol every 6 hours if needed.  Subjective and objective measures of pulmonary function will be followed and the treatment plan will be adjusted accordingly.  Cough, persistent The most common causes of chronic cough include the following: upper airway cough syndrome (UACS) which is caused by variety of rhinosinus conditions; asthma; gastroesophageal reflux disease (GERD); chronic bronchitis from cigarette smoking or  other inhaled environmental irritants; non-asthmatic eosinophilic bronchitis; and bronchiectasis. In prospective studies, these conditions have accounted for up to 94% of the causes of chronic cough in immunocompetent adults. The history and physical examination suggest that her cough is multifactorial with contribution from postnasal drainage and bronchial hyperresponsiveness. We will address these issues at this time.   Treatment plan as outlined above.    We will regroup in 8 weeks to assess treatment response and adjust therapy accordingly.   Return in about 2 months (around 10/01/2017), or if symptoms worsen or fail to improve.  Control of House Dust Mite Allergen  House dust mites play a major role in allergic asthma and rhinitis.  They occur in environments with high humidity wherever human skin, the food for dust mites is found. High levels have been detected in dust obtained from mattresses, pillows, carpets, upholstered furniture, bed covers, clothes and soft toys.  The principal allergen of the house dust mite is found in its feces.  A gram of dust may contain 1,000 mites and 250,000 fecal particles.  Mite antigen is easily measured in the air during house cleaning activities.    1. Encase mattresses, including the box spring, and pillow, in an air tight cover.  Seal the zipper end of the encased mattresses with wide adhesive tape. 2. Wash the bedding in water of 130 degrees Farenheit weekly.  Avoid cotton comforters/quilts and flannel bedding: the most ideal bed covering is the dacron comforter. 3. Remove all upholstered furniture from the bedroom. 4. Remove carpets, carpet padding, rugs, and non-washable window drapes from the bedroom.  Wash drapes weekly or use plastic window coverings. 5. Remove all non-washable stuffed toys from the bedroom.  Wash stuffed toys weekly. 6. Have the room cleaned frequently with  a vacuum cleaner and a damp dust-mop.  The patient should not be in a room  which is being cleaned and should wait 1 hour after cleaning before going into the room. 7. Close and seal all heating outlets in the bedroom.  Otherwise, the room will become filled with dust-laden air.  An electric heater can be used to heat the room. 8. Reduce indoor humidity to less than 50%.  Do not use a humidifier.  Control of Dog or Cat Allergen  Avoidance is the best way to manage a dog or cat allergy. If you have a dog or cat and are allergic to dog or cats, consider removing the dog or cat from the home. If you have a dog or cat but don't want to find it a new home, or if your family wants a pet even though someone in the household is allergic, here are some strategies that may help keep symptoms at bay:  1. Keep the pet out of your bedroom and restrict it to only a few rooms. Be advised that keeping the dog or cat in only one room will not limit the allergens to that room. 2. Don't pet, hug or kiss the dog or cat; if you do, wash your hands with soap and water. 3. High-efficiency particulate air (HEPA) cleaners run continuously in a bedroom or living room can reduce allergen levels over time. 4. Place electrostatic material sheet in the air inlet vent in the bedroom. 5. Regular use of a high-efficiency vacuum cleaner or a central vacuum can reduce allergen levels. 6. Giving your dog or cat a bath at least once a week can reduce airborne allergen.

## 2017-08-01 NOTE — Progress Notes (Signed)
New Patient Note  RE: [name] MRN: [id number] DOB: [birth year] Date of Office Visit: 08/01/2017  Referring provider: Martinique, Betty G, MD Primary care provider: Martinique, Betty G, MD  Chief Complaint: Cough; Breathing Problem; Nasal Congestion; and Conjunctivitis   History of present illness: [name] is a 62 y.o. female seen today in consultation requested by Betty Martinique, MD.  She complains of a nonproductive cough, chest tightness, and "running out of air" when talking a lot or singing.  These symptoms have progressed over the past year.  She has been prescribed montelukast and Symbicort.  She reports that Symbicort causes her to feel anxious and jittery and so she prefers to not take this medication.  She experiences nasal congestion, rhinorrhea, postnasal drainage and throat clearing, and occasional sinus pressure.  No significant seasonal symptom variation has been noted nor have specific environmental triggers been identified.  She currently takes phenylephrine every 2 or 3 days as well as montelukast.  She underwent septoplasty in the past without perceived benefit.  She also complains of "goopy" drainage from her eyes as well as some ocular pruritus.  The ocular symptoms began approximately 4 or 5 months ago.  Assessment and plan: Perennial allergic rhinitis  Aeroallergen avoidance measures have been discussed and provided in written form.  A prescription has been provided for carbinoxamine 4 mg every 8 hours if needed.  A prescription has been provided for ipratropium 0.06% nasal spray, 1-2 sprays per nostril every 8 hours as needed. Proper nasal spray technique has been discussed and demonstrated.   Continue azelastine nasal spray, 1 to 2 sprays per nostril 2 times daily if needed.  Nasal saline lavage (NeilMed) has been recommended as needed and prior to medicated nasal sprays along with instructions for proper administration.  Discontinue phenylephrine.  If  allergen avoidance measures and medications fail to adequately relieve symptoms, aeroallergen immunotherapy will be considered.  Allergic conjunctivitis  Treatment plan as outlined above for allergic rhinitis.  A prescription has been provided for Patanol, one drop per eye twice daily as needed.  I have also recommended eye lubricant drops (i.e., Natural Tears) as needed.  Moderate persistent asthma  Due to perceived side effects, discontinue Symbicort.  A prescription has been provided for Flovent (fluticasone) 220 g,  2 inhalations via spacer device twice a day.  To maximize pulmonary deposition, a spacer has been provided along with instructions for its proper administration with an HFA inhaler.  A prescription has been provided for Spiriva Respimat 1.25 g, 2 inhalations daily.  Continue montelukast 10 mg daily bedtime and albuterol every 6 hours if needed.  Subjective and objective measures of pulmonary function will be followed and the treatment plan will be adjusted accordingly.  Cough, persistent The most common causes of chronic cough include the following: upper airway cough syndrome (UACS) which is caused by variety of rhinosinus conditions; asthma; gastroesophageal reflux disease (GERD); chronic bronchitis from cigarette smoking or other inhaled environmental irritants; non-asthmatic eosinophilic bronchitis; and bronchiectasis. In prospective studies, these conditions have accounted for up to 94% of the causes of chronic cough in immunocompetent adults. The history and physical examination suggest that her cough is multifactorial with contribution from postnasal drainage and bronchial hyperresponsiveness. We will address these issues at this time.   Treatment plan as outlined above.    We will regroup in 8 weeks to assess treatment response and adjust therapy accordingly.   Meds ordered this encounter  Medications  . Carbinoxamine Maleate 4 MG  TABS    Sig: Take 1  tablet (4 mg total) by mouth every 8 (eight) hours as needed.    Dispense:  28 each    Refill:  1  . ipratropium (ATROVENT) 0.06 % nasal spray    Sig: Place 2 sprays into both nostrils every 8 (eight) hours as needed for rhinitis.    Dispense:  15 mL    Refill:  1  . olopatadine (PATANOL) 0.1 % ophthalmic solution    Sig: Place 1 drop into both eyes 2 (two) times daily.    Dispense:  5 mL    Refill:  1  . Tiotropium Bromide Monohydrate (SPIRIVA RESPIMAT) 1.25 MCG/ACT AERS    Sig: Inhale 2 puffs into the lungs daily at 2 am.    Dispense:  4 g    Refill:  1  . fluticasone (FLOVENT HFA) 220 MCG/ACT inhaler    Sig: Inhale 2 puffs into the lungs 2 (two) times daily.    Dispense:  12 g    Refill:  1  . albuterol (PROVENTIL HFA;VENTOLIN HFA) 108 (90 Base) MCG/ACT inhaler    Sig: Inhale 1-2 puffs into the lungs daily as needed for wheezing or shortness of breath.    Dispense:  54 Inhaler    Refill:  1    Diagnostics: Spirometry: FVC was 2.62 L and FEV1 was 2.11 L with 220 mL postbronchodilator improvement.  This study was performed while the patient was asymptomatic.  Please see scanned spirometry results for details. Epicutaneous testing: Positive to dust mite antigen. Intradermal testing: Positive to cat hair.    Physical examination: Blood pressure 140/76, pulse 100, temperature 98.9 F (37.2 C), temperature source Oral, resp. rate 16, height 4\' 11"  (1.499 m), weight 173 lb 9.6 oz (78.7 kg), SpO2 95 %.  General: Alert, interactive, in no acute distress. HEENT: TMs pearly gray, turbinates moderately edematous with thick discharge, post-pharynx moderately erythematous. Neck: Supple without lymphadenopathy. Lungs: Clear to auscultation without wheezing, rhonchi or rales. CV: Normal S1, S2 without murmurs. Abdomen: Nondistended, nontender. Skin: Warm and dry, without lesions or rashes. Extremities:  No clubbing, cyanosis or edema. Neuro:   Grossly intact.  Review of systems:    Review of systems negative except as noted in HPI / PMHx or noted below: Review of Systems  Constitutional: Negative.   HENT: Negative.   Eyes: Negative.   Respiratory: Negative.   Cardiovascular: Negative.   Gastrointestinal: Negative.   Genitourinary: Negative.   Musculoskeletal: Negative.   Skin: Negative.   Neurological: Negative.   Endo/Heme/Allergies: Negative.   Psychiatric/Behavioral: Negative.     Past medical history:  Past Medical History:  Diagnosis Date  . Allergic rhinitis   . Benign essential tremor    hands  . Bipolar 1 disorder, mixed, moderate ([place])   . Bladder pain   . Claustrophobia   . Depression   . Eczema   . History of frequent urinary tract infections   . History of gastroesophageal reflux (GERD)    05-25-2017  per pt no issues since hiatal hernia repair 01/ 2018  . History of hiatal hernia   . History of melanoma excision    early 2000s-- BACK  . History of panic attacks   . 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 Hypothyroidism   . Interstitial cystitis   . Limited jaw range of motion    s/p  bilateral TMJ surgery,  age 24s  . Lower urinary tract symptoms (LUTS)   .  Migraines   . Mild asthma   . OA (osteoarthritis)   . OSA on CPAP    per last sleep study 09/ 2017  mild OSA, AHI13/hr  . PONV (postoperative nausea and vomiting)   . Recurrent upper respiratory infection (URI)   . Rosacea   . Sensation of pressure in bladder area   . TMJ arthralgia   . Urticaria   . Wears glasses   . White coat syndrome without hypertension    05-25-2017  per pt hx hypertension yrs ago, no issues after quitting stressful job    Past surgical history:  Past Surgical History:  Procedure Laterality Date  . [place] STUDY N/A 09/22/2015   Procedure: [place] STUDY;  Surgeon: Doran Stabler, MD;  Location: [facility];  Service: Gastroenterology;  Laterality: N/A;  . ADENOIDECTOMY    . ANAL FISSURE REPAIR  age 28  (40)   . BREAST EXCISIONAL BIOPSY Right 04-16-2003  dr p. young  [place]   benign  . BUNIONECTOMY Right early 2000s  . CARPAL TUNNEL RELEASE Left age 20 (46)  . CHOLECYSTECTOMY OPEN  age 79  . CYSTO WITH HYDRODISTENSION N/A 06/02/2017   Procedure: CYSTOSCOPY/HYDRODISTENSION OF BLADDER;  Surgeon: Irine Seal, MD;  Location: [facility];  Service: Urology;  Laterality: N/A;  . CYSTO/ HYDRODISTENTION/  INSTILLATION THERAPY  1990s  . DX LAPAROSCOPY/  DX HYSTEROSCOPY/  D & C  08-07-2007   dr dove  [facility]  . ENDOMETRIAL ABLATION W/ NOVASURE  10-21-2008   dr Harolyn Rutherford  [facility]  . ESOPHAGEAL MANOMETRY N/A 09/22/2015   Procedure: ESOPHAGEAL MANOMETRY (EM);  Surgeon: Doran Stabler, MD;  Location: [facility];  Service: Gastroenterology;  Laterality: N/A;  with impedence   . FINGER ARTHROPLASTY Bilateral    left little finger 2016:  right middle finger 06/ 2018  . KNEE ARTHROSCOPY Bilateral x3 left ;  x3  right-- last one age 14 (15)  . NASAL SEPTUM SURGERY  age 43s  . ROBOT ASSISTED REDUCTION PARAESOPHAGEAL HIATAL HERNIA/ TYPE 2 MEDIASTINAL DISSECTION/ PRIMARY HIATAL HERNIA REPAIR/ ANTERIOR & POSTERIOR GASTROPEXY/ NISSEN FUNDOPLICATION  [phone]   DR GROSS  [facility]  . SINOSCOPY    . TEMPOROMANDIBULAR JOINT SURGERY Bilateral x3  -- age 8s   per pt used graft  . TONSILLECTOMY    . TRIGGER FINGER RELEASE Bilateral last one 2017   several release's bilaterally  . ULNAR NERVE TRANSPOSITION Bilateral age 13 (19)    Family history: Family History  Problem Relation Age of Onset  . Hypertension Mother        deceased from MVA complications  . Thyroid disease Mother   . Allergic rhinitis Mother   . Testicular cancer Father   . Allergic rhinitis Father   . Colon polyps Sister   . Coronary artery disease Unknown     Social history: Social History   Socioeconomic History  . Marital status: Divorced    Spouse name: Not on file  . Number of children: 0  . Years of education: Not on file  .  Highest education level: Not on file  Occupational History  . Not on file  Social Needs  . Financial resource strain: Not on file  . Food insecurity:    Worry: Not on file    Inability: Not on file  . Transportation needs:    Medical: Not on file    Non-medical: Not on file  Tobacco Use  . Smoking status: Never  Smoker  . Smokeless tobacco: Never Used  Substance and Sexual Activity  . Alcohol use: Yes    Alcohol/week: 0.0 oz    Comment: once every 3-4 months  . Drug use: No  . Sexual activity: Not on file  Lifestyle  . Physical activity:    Days per week: Not on file    Minutes per session: Not on file  . Stress: Not on file  Relationships  . Social connections:    Talks on phone: Not on file    Gets together: Not on file    Attends religious service: Not on file    Active member of club or organization: Not on file    Attends meetings of clubs or organizations: Not on file    Relationship status: Not on file  . Intimate partner violence:    Fear of current or ex partner: Not on file    Emotionally abused: Not on file    Physically abused: Not on file    Forced sexual activity: Not on file  Other Topics Concern  . Not on file  Social History Narrative  . Not on file   Environmental History: The patient lives in a 61 year old mobile home with old carpeting throughout and central air/heat.  There are 2 cats in the home which have access to her bedroom.  There is mold/water damage in the home.  She is a non-smoker.  Allergies as of 08/01/2017      Reactions   Amlodipine Swelling   Peripheral edema   Pseudoephedrine Other (See Comments)   I fly off the walls    Risperidone And Related Other (See Comments)   Stomach upset, insomnia, drooling, tremors, "jerks," sensitivity to touch.    Propoxyphene Itching   DARVOCET   Sulfa Antibiotics Other (See Comments)   Unknown reaction   Talwin [pentazocine]    Unknown reaction   Aspirin Other (See Comments)   REACTION:  upset stomach   Bupivacaine Hcl Hives   Codeine Itching   Etodolac Rash   Indocin [indomethacin] Other (See Comments)   Muscle spasms   Ivp Dye [iodinated Diagnostic Agents] Itching   Flushed and Fever, itch all over   Pentazocine Lactate Itching   oozing   Propranolol Nausea Only   Tramadol Hcl Other (See Comments)   unknown      Medication List        Accurate as of 08/01/17  7:14 PM. Always use your most recent med list.          AEROCHAMBER PLUS inhaler Use as instructed   albuterol 108 (90 Base) MCG/ACT inhaler Commonly known as:  PROVENTIL HFA;VENTOLIN HFA Inhale 1-2 puffs into the lungs daily as needed for wheezing or shortness of breath.   alendronate 70 MG tablet Commonly known as:  FOSAMAX TAKE 1 TABLET BY MOUTH EVERY 7 DAYS WITH FULL GLASS OF WATER ON EMPTY STOMACH   amitriptyline 10 MG tablet Commonly known as:  ELAVIL TAKE 1 TABLET BY MOUTH EVERYDAY AT BEDTIME   anti-nausea solution Take 30 mLs by mouth every 15 (fifteen) minutes as needed for nausea or vomiting.   Azelastine HCl 0.15 % Soln PLACE 2 SPRAYS IN BOTH NOSTRILS TWICE DAILY   budesonide-formoterol 80-4.5 MCG/ACT inhaler Commonly known as:  SYMBICORT Inhale 2 puffs into the lungs 2 (two) times daily.   Carbinoxamine Maleate 4 MG Tabs Take 1 tablet (4 mg total) by mouth every 8 (eight) hours as needed.   [employer] D3 5000 units capsule  Generic drug:  Cholecalciferol TAKE 2 TABLETS BY MOUTH EVERY DAY   diphenhydrAMINE 25 mg capsule Commonly known as:  BENADRYL Take 25 mg by mouth at bedtime as needed for sleep.   ELMIRON 100 MG capsule Generic drug:  pentosan polysulfate TAKE ONE CAPSULE EVERY DAY FOR 1 WEEK,THEN TAKE ONE CAPSULE TWICE A DAY   estradiol 0.1 MG/GM vaginal cream Commonly known as:  ESTRACE Place 1 Applicatorful vaginally 3 (three) times a week.   fluticasone 220 MCG/ACT inhaler Commonly known as:  FLOVENT HFA Inhale 2 puffs into the lungs 2 (two) times daily.     gabapentin 300 MG capsule Commonly known as:  NEURONTIN Take 300 mg by mouth 3 (three) times daily.   hydrocortisone cream 1 % Apply 1 application topically 2 (two) times daily as needed for itching.   hydrOXYzine 25 MG capsule Commonly known as:  VISTARIL TAKE 1 CAPSULE BY MOUTH THREE TIMES A DAY AS NEEDED   ibuprofen 200 MG tablet Commonly known as:  ADVIL,MOTRIN Take 200-800 mg by mouth every 6 (six) hours as needed for headache or moderate pain. Depends on pain   ipratropium 0.06 % nasal spray Commonly known as:  ATROVENT Place 2 sprays into both nostrils every 8 (eight) hours as needed for rhinitis.   levothyroxine 50 MCG tablet Commonly known as:  SYNTHROID, LEVOTHROID TAKE 1 TABLET EVERY DAY--- takes in am   loperamide 2 MG tablet Commonly known as:  IMODIUM A-D Take 2 mg by mouth 4 (four) times daily as needed for diarrhea or loose stools.   loratadine 10 MG tablet Commonly known as:  CLARITIN Take 1 tablet (10 mg total) by mouth daily.   mometasone 50 MCG/ACT nasal spray Commonly known as:  NASONEX Place 2 sprays into the nose every morning.   montelukast 10 MG tablet Commonly known as:  SINGULAIR Take 1 tablet (10 mg total) by mouth at bedtime.   naproxen sodium 220 MG tablet Commonly known as:  ALEVE Take 4 tablets (880 mg total) by mouth 2 (two) times daily as needed (headache).   olopatadine 0.1 % ophthalmic solution Commonly known as:  PATANOL Place 1 drop into both eyes 2 (two) times daily.   phenylephrine 10 MG Tabs tablet Commonly known as:  SUDAFED PE Take 10 mg by mouth every 4 (four) hours as needed.   primidone 50 MG tablet Commonly known as:  MYSOLINE Take 1 tablet (50 mg total) by mouth 2 (two) times daily.   rizatriptan 10 MG disintegrating tablet Commonly known as:  MAXALT-MLT Take 1 tablet (10 mg total) by mouth daily as needed for migraine. May repeat in 2 hours if needed. Do not exceed 2 doses in 1 week period   rosuvastatin 20  MG tablet Commonly known as:  CRESTOR TAKE 1 TABLET BY MOUTH EVERY DAY   sertraline 100 MG tablet Commonly known as:  ZOLOFT Take 2 tablets (200 mg total) by mouth daily.   Tiotropium Bromide Monohydrate 1.25 MCG/ACT Aers Commonly known as:  SPIRIVA RESPIMAT Inhale 2 puffs into the lungs daily at 2 am.   ziprasidone 20 MG capsule Commonly known as:  GEODON Take 20-40 mg by mouth 2 (two) times daily with a meal. 20 mg at noon and 40 mg in the evening       Known medication allergies: Allergies  Allergen Reactions  . Amlodipine Swelling    Peripheral edema  . Pseudoephedrine Other (See Comments)    I fly off the walls   .  Risperidone And Related Other (See Comments)    Stomach upset, insomnia, drooling, tremors, "jerks," sensitivity to touch.   . Propoxyphene Itching    DARVOCET  . Sulfa Antibiotics Other (See Comments)    Unknown reaction  . Talwin [Pentazocine]     Unknown reaction  . Aspirin Other (See Comments)    REACTION: upset stomach  . Bupivacaine Hcl Hives  . Codeine Itching  . Etodolac Rash  . Indocin [Indomethacin] Other (See Comments)    Muscle spasms  . Ivp Dye [Iodinated Diagnostic Agents] Itching    Flushed and Fever, itch all over  . Pentazocine Lactate Itching    oozing  . Propranolol Nausea Only  . Tramadol Hcl Other (See Comments)    unknown    I appreciate the opportunity to take part in [name]'s care. Please do not hesitate to contact me with questions.  Sincerely,   R. Edgar Frisk, MD

## 2017-08-02 ENCOUNTER — Other Ambulatory Visit: Payer: Self-pay | Admitting: *Deleted

## 2017-08-02 DIAGNOSIS — N3010 Interstitial cystitis (chronic) without hematuria: Secondary | ICD-10-CM | POA: Diagnosis not present

## 2017-08-02 MED ORDER — ALBUTEROL SULFATE HFA 108 (90 BASE) MCG/ACT IN AERS: 1.0000 | INHALATION_SPRAY | Freq: Every day | RESPIRATORY_TRACT | 1 refills | Status: DC | PRN

## 2017-08-03 ENCOUNTER — Telehealth: Payer: Self-pay | Admitting: *Deleted

## 2017-08-03 NOTE — Telephone Encounter (Signed)
Patient called states that the Spiriva prescribed needs a prior authorization. Writer called pharmacy and they state that Advair, Breo, and Anoro are preferred Dr Verlin Fester please advise

## 2017-08-04 ENCOUNTER — Other Ambulatory Visit: Payer: Self-pay | Admitting: *Deleted

## 2017-08-04 DIAGNOSIS — R69 Illness, unspecified: Secondary | ICD-10-CM | POA: Diagnosis not present

## 2017-08-04 NOTE — Telephone Encounter (Signed)
She has side effects with LABAs and all of thos options have LABAs, so she can not use them.

## 2017-08-04 NOTE — Telephone Encounter (Signed)
PA has been approved

## 2017-08-04 NOTE — Telephone Encounter (Signed)
Please proceed with the prior auth. Thanks!

## 2017-08-04 NOTE — Telephone Encounter (Signed)
PA submitted via CoverMyMeds. Will check for response later today.

## 2017-08-05 ENCOUNTER — Telehealth: Payer: Self-pay | Admitting: *Deleted

## 2017-08-05 NOTE — Telephone Encounter (Signed)
Patient called inquiring about her Spiriva prescription being denied and wanting an alternative. I explained to her that there was a PA done and it was approved and to give it a short amount of time for the pharmacy to receive notification.

## 2017-08-08 DIAGNOSIS — F3132 Bipolar disorder, current episode depressed, moderate: Secondary | ICD-10-CM | POA: Diagnosis not present

## 2017-08-08 DIAGNOSIS — R69 Illness, unspecified: Secondary | ICD-10-CM | POA: Diagnosis not present

## 2017-08-12 ENCOUNTER — Other Ambulatory Visit: Payer: Self-pay | Admitting: Family Medicine

## 2017-08-12 DIAGNOSIS — E559 Vitamin D deficiency, unspecified: Secondary | ICD-10-CM

## 2017-08-24 ENCOUNTER — Other Ambulatory Visit: Payer: Self-pay | Admitting: Allergy and Immunology

## 2017-08-25 DIAGNOSIS — F3132 Bipolar disorder, current episode depressed, moderate: Secondary | ICD-10-CM | POA: Diagnosis not present

## 2017-08-25 DIAGNOSIS — R69 Illness, unspecified: Secondary | ICD-10-CM | POA: Diagnosis not present

## 2017-09-01 ENCOUNTER — Other Ambulatory Visit: Payer: Self-pay

## 2017-09-01 ENCOUNTER — Telehealth: Payer: Self-pay

## 2017-09-01 DIAGNOSIS — J4520 Mild intermittent asthma, uncomplicated: Secondary | ICD-10-CM | POA: Diagnosis not present

## 2017-09-01 DIAGNOSIS — R69 Illness, unspecified: Secondary | ICD-10-CM | POA: Diagnosis not present

## 2017-09-01 DIAGNOSIS — F3132 Bipolar disorder, current episode depressed, moderate: Secondary | ICD-10-CM | POA: Diagnosis not present

## 2017-09-01 MED ORDER — ALBUTEROL SULFATE (2.5 MG/3ML) 0.083% IN NEBU: 2.5000 mg | INHALATION_SOLUTION | Freq: Four times a day (QID) | RESPIRATORY_TRACT | 1 refills | Status: DC | PRN

## 2017-09-01 NOTE — Telephone Encounter (Signed)
Lm for pt to call us back  

## 2017-09-01 NOTE — Telephone Encounter (Signed)
Spoke with pt and sent in rx for albuterol. [name] is going to pull a neb and then show pt when she comes in

## 2017-09-01 NOTE — Telephone Encounter (Signed)
Yes, may provide nebulizer and a script for albuterol 0.083% (I don't think insurance will pay for xopenex). Thanks.

## 2017-09-01 NOTE — Telephone Encounter (Signed)
Pt called in and said she was starting to have a raspy voice, dry cough and a tickle in her chest. She stated the breathing treatment helped her when she was seen in clinic. Would it be okay to provide pt with nebulizer and albuterol or xopenex? please advise. pts cell [phone]

## 2017-09-08 DIAGNOSIS — R69 Illness, unspecified: Secondary | ICD-10-CM | POA: Diagnosis not present

## 2017-09-08 DIAGNOSIS — F3132 Bipolar disorder, current episode depressed, moderate: Secondary | ICD-10-CM | POA: Diagnosis not present

## 2017-09-10 ENCOUNTER — Other Ambulatory Visit: Payer: Self-pay | Admitting: Family Medicine

## 2017-09-10 DIAGNOSIS — E559 Vitamin D deficiency, unspecified: Secondary | ICD-10-CM

## 2017-09-12 DIAGNOSIS — R69 Illness, unspecified: Secondary | ICD-10-CM | POA: Diagnosis not present

## 2017-09-15 DIAGNOSIS — R69 Illness, unspecified: Secondary | ICD-10-CM | POA: Diagnosis not present

## 2017-09-15 DIAGNOSIS — M25561 Pain in right knee: Secondary | ICD-10-CM | POA: Diagnosis not present

## 2017-09-15 DIAGNOSIS — M25562 Pain in left knee: Secondary | ICD-10-CM | POA: Diagnosis not present

## 2017-09-15 DIAGNOSIS — F3132 Bipolar disorder, current episode depressed, moderate: Secondary | ICD-10-CM | POA: Diagnosis not present

## 2017-09-20 ENCOUNTER — Other Ambulatory Visit: Payer: Self-pay | Admitting: Family Medicine

## 2017-09-20 ENCOUNTER — Other Ambulatory Visit: Payer: Self-pay | Admitting: Allergy and Immunology

## 2017-09-20 DIAGNOSIS — G43809 Other migraine, not intractable, without status migrainosus: Secondary | ICD-10-CM

## 2017-09-22 DIAGNOSIS — R69 Illness, unspecified: Secondary | ICD-10-CM | POA: Diagnosis not present

## 2017-09-22 DIAGNOSIS — F3132 Bipolar disorder, current episode depressed, moderate: Secondary | ICD-10-CM | POA: Diagnosis not present

## 2017-09-26 ENCOUNTER — Encounter: Payer: Self-pay | Admitting: Allergy and Immunology

## 2017-09-26 ENCOUNTER — Ambulatory Visit: Payer: Medicare HMO | Admitting: Allergy and Immunology

## 2017-09-26 VITALS — BP 112/70 | HR 100 | Resp 16 | Ht 60.5 in

## 2017-09-26 DIAGNOSIS — R490 Dysphonia: Secondary | ICD-10-CM | POA: Diagnosis not present

## 2017-09-26 DIAGNOSIS — R05 Cough: Secondary | ICD-10-CM

## 2017-09-26 DIAGNOSIS — J3089 Other allergic rhinitis: Secondary | ICD-10-CM

## 2017-09-26 DIAGNOSIS — R053 Chronic cough: Secondary | ICD-10-CM

## 2017-09-26 DIAGNOSIS — J4540 Moderate persistent asthma, uncomplicated: Secondary | ICD-10-CM | POA: Diagnosis not present

## 2017-09-26 MED ORDER — TIOTROPIUM BROMIDE MONOHYDRATE 1.25 MCG/ACT IN AERS: 1.2500 ug | INHALATION_SPRAY | Freq: Every day | RESPIRATORY_TRACT | 5 refills | Status: DC

## 2017-09-26 MED ORDER — FLUTICASONE PROPIONATE HFA 220 MCG/ACT IN AERO: INHALATION_SPRAY | RESPIRATORY_TRACT | 3 refills | Status: DC

## 2017-09-26 MED ORDER — MONTELUKAST SODIUM 10 MG PO TABS
10.0000 mg | ORAL_TABLET | Freq: Every day | ORAL | 5 refills | Status: DC
Start: 2017-09-26 — End: 2017-10-24

## 2017-09-26 MED ORDER — AZELASTINE HCL 0.15 % NA SOLN: 2.0000 | Freq: Every day | NASAL | 3 refills | Status: DC | PRN

## 2017-09-26 MED ORDER — AZELASTINE HCL 0.15 % NA SOLN: 1.0000 | Freq: Two times a day (BID) | NASAL | 3 refills | Status: DC | PRN

## 2017-09-26 NOTE — Assessment & Plan Note (Signed)
Cough, multifactorial.  Treatment plan as outlined above.

## 2017-09-26 NOTE — Assessment & Plan Note (Addendum)
   Continue appropriate allergen avoidance measures, nasal saline irrigation 1-2 times daily, and azelastine nasal spray, 1 to 2 sprays per nostril 2 times daily if needed.  Add mometasone nasal spray, 1 spray per nostril twice daily as needed.  Refill prescriptions have been provided.  If allergen avoidance measures and medications fail to adequately relieve symptoms, aeroallergen immunotherapy will be considered.

## 2017-09-26 NOTE — Assessment & Plan Note (Signed)
   For now, continue Flovent (fluticasone) 220 g, 2 inhalations via spacer device twice a day, Spiriva Respimat 1.25 g, 2 inhalations daily, montelukast 10 mg daily bedtime, and albuterol every 6 hours if needed.  Subjective and objective measures of pulmonary function will be followed and the treatment plan will be adjusted accordingly.

## 2017-09-26 NOTE — Assessment & Plan Note (Addendum)
   Treatment plan as outlined above for allergic rhinitis.  Continue use of a spacer device with inhaled corticosteroids as well as rinsing and gargling after the use of inhaled corticosteroids.  If this problem persists or progresses despite treatment plan as outlined above, further evaluation/treatment by otolaryngologist, Dr. Teoh, may be warranted. 

## 2017-09-26 NOTE — Patient Instructions (Addendum)
Moderate persistent asthma  For now, continue Flovent (fluticasone) 220 g, 2 inhalations via spacer device twice a day, Spiriva Respimat 1.25 g, 2 inhalations daily, montelukast 10 mg daily bedtime, and albuterol every 6 hours if needed.  Subjective and objective measures of pulmonary function will be followed and the treatment plan will be adjusted accordingly.  Perennial allergic rhinitis  Continue appropriate allergen avoidance measures, nasal saline irrigation 1-2 times daily, and azelastine nasal spray, 1 to 2 sprays per nostril 2 times daily if needed.  Add mometasone nasal spray, 1 spray per nostril twice daily as needed.  Refill prescriptions have been provided.  If allergen avoidance measures and medications fail to adequately relieve symptoms, aeroallergen immunotherapy will be considered.  Cough, persistent Cough, multifactorial.  Treatment plan as outlined above.  Dysphonia  Treatment plan as outlined above for allergic rhinitis.  Continue use of a spacer device with inhaled corticosteroids as well as rinsing and gargling after the use of inhaled corticosteroids.  If this problem persists or progresses despite treatment plan as outlined above, further evaluation/treatment by otolaryngologist, Dr. Benjamine Mola, may be warranted.   Return in about 5 months (around 02/26/2018), or if symptoms worsen or fail to improve.

## 2017-09-26 NOTE — Progress Notes (Signed)
Follow-up Note  RE: [name] MRN: [id number] DOB: Jun 13, 1955 Date of Office Visit: 09/26/2017  Primary care provider: Martinique, Betty G, MD Referring provider: Martinique, Betty G, MD  History of present illness: [name] is a 62 y.o. female with persistent asthma, allergic rhinoconjunctivitis, and history of persistent cough presenting today for follow-up.  She is previously seen in this clinic for her initial evaluation on August 01, 2017.  She reports that overall her asthma and cough have improved, however she is still requiring albuterol rescue 2, or occasionally 3, times per week on average.  She complains that she is still having difficulty with singing and that her high notes sound like "multiple violin strings being played it once".  She is uncertain if this is due to thick postnasal drainage.  She is currently taking azelastine nasal spray and nasal saline irrigation twice daily.  She does not complain of nasal congestion or sinus pressure.  Assessment and plan: Moderate persistent asthma  For now, continue Flovent (fluticasone) 220 g, 2 inhalations via spacer device twice a day, Spiriva Respimat 1.25 g, 2 inhalations daily, montelukast 10 mg daily bedtime, and albuterol every 6 hours if needed.  Subjective and objective measures of pulmonary function will be followed and the treatment plan will be adjusted accordingly.  Perennial allergic rhinitis  Continue appropriate allergen avoidance measures, nasal saline irrigation 1-2 times daily, and azelastine nasal spray, 1 to 2 sprays per nostril 2 times daily if needed.  Add mometasone nasal spray, 1 spray per nostril twice daily as needed.  Refill prescriptions have been provided.  If allergen avoidance measures and medications fail to adequately relieve symptoms, aeroallergen immunotherapy will be considered.  Cough, persistent Cough, multifactorial.  Treatment plan as outlined above.  Dysphonia  Treatment plan  as outlined above for allergic rhinitis.  Continue use of a spacer device with inhaled corticosteroids as well as rinsing and gargling after the use of inhaled corticosteroids.  If this problem persists or progresses despite treatment plan as outlined above, further evaluation/treatment by otolaryngologist, Dr. Benjamine Mola, may be warranted.   Meds ordered this encounter  Medications  . fluticasone (FLOVENT HFA) 220 MCG/ACT inhaler    Sig: TAKE 2 PUFFS BY MOUTH TWICE A DAY    Dispense:  12 Inhaler    Refill:  3  . Tiotropium Bromide Monohydrate (SPIRIVA RESPIMAT) 1.25 MCG/ACT AERS    Sig: Inhale 1.25 mcg into the lungs daily.    Dispense:  4 g    Refill:  5  . montelukast (SINGULAIR) 10 MG tablet    Sig: Take 1 tablet (10 mg total) by mouth at bedtime.    Dispense:  30 tablet    Refill:  5  . DISCONTD: Azelastine HCl 0.15 % SOLN    Sig: Place 1-2 sprays into the nose 2 (two) times daily as needed.    Dispense:  30 mL    Refill:  3  . Azelastine HCl 0.15 % SOLN    Sig: Place 2 sprays into the nose daily as needed.    Dispense:  30 mL    Refill:  3    Diagnostics: Spirometry:  Normal with an FEV1 of 86% predicted and an FEV1 ratio of 100%.  Please see scanned spirometry results for details.    Physical examination: Blood pressure 112/70, pulse 100, resp. rate 16, height 5' 0.5" (1.537 m), SpO2 98 %.  General: Alert, interactive, in no acute distress. HEENT: TMs pearly gray, turbinates mildly  edematous with clear discharge, post-pharynx mildly erythematous. Neck: Supple without lymphadenopathy. Lungs: Clear to auscultation without wheezing, rhonchi or rales. CV: Normal S1, S2 without murmurs. Skin: Warm and dry, without lesions or rashes.  The following portions of the patient's history were reviewed and updated as appropriate: allergies, current medications, past family history, past medical history, past social history, past surgical history and problem list.  Allergies as of  09/26/2017      Reactions   Amlodipine Swelling   Peripheral edema   Pseudoephedrine Other (See Comments)   I fly off the walls    Risperidone And Related Other (See Comments)   Stomach upset, insomnia, drooling, tremors, "jerks," sensitivity to touch.    Propoxyphene Itching   DARVOCET   Sulfa Antibiotics Other (See Comments)   Unknown reaction   Talwin [pentazocine]    Unknown reaction   Aspirin Other (See Comments)   REACTION: upset stomach   Bupivacaine Hcl Hives   Codeine Itching   Etodolac Rash   Indocin [indomethacin] Other (See Comments)   Muscle spasms   Ivp Dye [iodinated Diagnostic Agents] Itching   Flushed and Fever, itch all over   Pentazocine Lactate Itching   oozing   Propranolol Nausea Only   Tramadol Hcl Other (See Comments)   unknown      Medication List        Accurate as of 09/26/17  9:55 PM. Always use your most recent med list.          AEROCHAMBER PLUS inhaler Use as instructed   albuterol 108 (90 Base) MCG/ACT inhaler Commonly known as:  PROVENTIL HFA;VENTOLIN HFA Inhale 1-2 puffs into the lungs daily as needed for wheezing or shortness of breath.   albuterol (2.5 MG/3ML) 0.083% nebulizer solution Commonly known as:  PROVENTIL Take 3 mLs (2.5 mg total) by nebulization every 6 (six) hours as needed for wheezing or shortness of breath.   alendronate 70 MG tablet Commonly known as:  FOSAMAX TAKE 1 TABLET BY MOUTH EVERY 7 DAYS WITH FULL GLASS OF WATER ON EMPTY STOMACH   amitriptyline 10 MG tablet Commonly known as:  ELAVIL TAKE 1 TABLET BY MOUTH EVERYDAY AT BEDTIME   anti-nausea solution Take 30 mLs by mouth every 15 (fifteen) minutes as needed for nausea or vomiting.   Azelastine HCl 0.15 % Soln PLACE 2 SPRAYS IN BOTH NOSTRILS TWICE DAILY   Azelastine HCl 0.15 % Soln Place 2 sprays into the nose daily as needed.   budesonide-formoterol 80-4.5 MCG/ACT inhaler Commonly known as:  SYMBICORT Inhale 2 puffs into the lungs 2 (two) times  daily.   Carbinoxamine Maleate 4 MG Tabs Take 1 tablet (4 mg total) by mouth every 8 (eight) hours as needed.   [facility] D3 5000 units capsule Generic drug:  Cholecalciferol TAKE 2 TABLETS BY MOUTH EVERY DAY   diphenhydrAMINE 25 mg capsule Commonly known as:  BENADRYL Take 25 mg by mouth at bedtime as needed for sleep.   ELMIRON 100 MG capsule Generic drug:  pentosan polysulfate TAKE ONE CAPSULE EVERY DAY FOR 1 WEEK,THEN TAKE ONE CAPSULE TWICE A DAY   estradiol 0.1 MG/GM vaginal cream Commonly known as:  ESTRACE Place 1 Applicatorful vaginally 3 (three) times a week.   fluticasone 220 MCG/ACT inhaler Commonly known as:  FLOVENT HFA TAKE 2 PUFFS BY MOUTH TWICE A DAY   gabapentin 300 MG capsule Commonly known as:  NEURONTIN Take 300 mg by mouth 3 (three) times daily.   hydrocortisone cream 1 % Apply 1  application topically 2 (two) times daily as needed for itching.   hydrOXYzine 25 MG capsule Commonly known as:  VISTARIL TAKE 1 CAPSULE BY MOUTH THREE TIMES A DAY AS NEEDED   ibuprofen 200 MG tablet Commonly known as:  ADVIL,MOTRIN Take 200-800 mg by mouth every 6 (six) hours as needed for headache or moderate pain. Depends on pain   ipratropium 0.06 % nasal spray Commonly known as:  ATROVENT Place 2 sprays into both nostrils every 8 (eight) hours as needed for rhinitis.   levothyroxine 50 MCG tablet Commonly known as:  SYNTHROID, LEVOTHROID TAKE 1 TABLET EVERY DAY--- takes in am   loperamide 2 MG tablet Commonly known as:  IMODIUM A-D Take 2 mg by mouth 4 (four) times daily as needed for diarrhea or loose stools.   loratadine 10 MG tablet Commonly known as:  CLARITIN Take 1 tablet (10 mg total) by mouth daily.   mometasone 50 MCG/ACT nasal spray Commonly known as:  NASONEX Place 2 sprays into the nose every morning.   montelukast 10 MG tablet Commonly known as:  SINGULAIR Take 1 tablet (10 mg total) by mouth at bedtime.   montelukast 10 MG tablet Commonly  known as:  SINGULAIR Take 1 tablet (10 mg total) by mouth at bedtime.   naproxen sodium 220 MG tablet Commonly known as:  ALEVE Take 4 tablets (880 mg total) by mouth 2 (two) times daily as needed (headache).   olopatadine 0.1 % ophthalmic solution Commonly known as:  PATANOL Place 1 drop into both eyes 2 (two) times daily.   phenylephrine 10 MG Tabs tablet Commonly known as:  SUDAFED PE Take 10 mg by mouth every 4 (four) hours as needed.   primidone 50 MG tablet Commonly known as:  MYSOLINE Take 1 tablet (50 mg total) by mouth 2 (two) times daily.   rizatriptan 10 MG disintegrating tablet Commonly known as:  MAXALT-MLT TAKE 1 TABLET EVERY DAY AS NEEDED FOR MIGRAINE, MAY REPEAT IN 2 HRS IF NEEDED. MAX 2 DOSES/WK   rosuvastatin 20 MG tablet Commonly known as:  CRESTOR TAKE 1 TABLET BY MOUTH EVERY DAY   sertraline 100 MG tablet Commonly known as:  ZOLOFT Take 2 tablets (200 mg total) by mouth daily.   Tiotropium Bromide Monohydrate 1.25 MCG/ACT Aers Inhale 1.25 mcg into the lungs daily.   ziprasidone 20 MG capsule Commonly known as:  GEODON Take 20-40 mg by mouth 2 (two) times daily with a meal. 20 mg at noon and 40 mg in the evening       Allergies  Allergen Reactions  . Amlodipine Swelling    Peripheral edema  . Pseudoephedrine Other (See Comments)    I fly off the walls   . Risperidone And Related Other (See Comments)    Stomach upset, insomnia, drooling, tremors, "jerks," sensitivity to touch.   . Propoxyphene Itching    DARVOCET  . Sulfa Antibiotics Other (See Comments)    Unknown reaction  . Talwin [Pentazocine]     Unknown reaction  . Aspirin Other (See Comments)    REACTION: upset stomach  . Bupivacaine Hcl Hives  . Codeine Itching  . Etodolac Rash  . Indocin [Indomethacin] Other (See Comments)    Muscle spasms  . Ivp Dye [Iodinated Diagnostic Agents] Itching    Flushed and Fever, itch all over  . Pentazocine Lactate Itching    oozing  .  Propranolol Nausea Only  . Tramadol Hcl Other (See Comments)    unknown   Review  of systems: Review of systems negative except as noted in HPI / PMHx or noted below: Constitutional: Negative.  HENT: Negative.   Eyes: Negative.  Respiratory: Negative.   Cardiovascular: Negative.  Gastrointestinal: Negative.  Genitourinary: Negative.  Musculoskeletal: Negative.  Neurological: Negative.  Endo/Heme/Allergies: Negative.  Cutaneous: Negative.  Past Medical History:  Diagnosis Date  . Allergic rhinitis   . Benign essential tremor    hands  . Bipolar 1 disorder, mixed, moderate ([place])   . Bladder pain   . Claustrophobia   . Depression   . Eczema   . History of frequent urinary tract infections   . History of gastroesophageal reflux (GERD)    05-25-2017  per pt no issues since hiatal hernia repair 01/ 2018  . History of hiatal hernia   . History of melanoma excision    early 2000s-- BACK  . History of panic attacks   . 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 Hypothyroidism   . Interstitial cystitis   . Limited jaw range of motion    s/p  bilateral TMJ surgery,  age 33s  . Lower urinary tract symptoms (LUTS)   . Migraines   . Mild asthma   . OA (osteoarthritis)   . OSA on CPAP    per last sleep study 09/ 2017  mild OSA, AHI13/hr  . PONV (postoperative nausea and vomiting)   . Recurrent upper respiratory infection (URI)   . Rosacea   . Sensation of pressure in bladder area   . TMJ arthralgia   . Urticaria   . Wears glasses   . White coat syndrome without hypertension    05-25-2017  per pt hx hypertension yrs ago, no issues after quitting stressful job    Family History  Problem Relation Age of Onset  . Hypertension Mother        deceased from MVA complications  . Thyroid disease Mother   . Allergic rhinitis Mother   . Testicular cancer Father   . Allergic rhinitis Father   . Colon polyps Sister   . Coronary artery disease Unknown      Social History   Socioeconomic History  . Marital status: Divorced    Spouse name: Not on file  . Number of children: 0  . Years of education: Not on file  . Highest education level: Not on file  Occupational History  . Not on file  Social Needs  . Financial resource strain: Not on file  . Food insecurity:    Worry: Not on file    Inability: Not on file  . Transportation needs:    Medical: Not on file    Non-medical: Not on file  Tobacco Use  . Smoking status: Never Smoker  . Smokeless tobacco: Never Used  Substance and Sexual Activity  . Alcohol use: Yes    Alcohol/week: 0.0 standard drinks    Comment: once every 3-4 months  . Drug use: No  . Sexual activity: Not on file  Lifestyle  . Physical activity:    Days per week: Not on file    Minutes per session: Not on file  . Stress: Not on file  Relationships  . Social connections:    Talks on phone: Not on file    Gets together: Not on file    Attends religious service: Not on file    Active member of club or organization: Not on file    Attends meetings of clubs or organizations: Not  on file    Relationship status: Not on file  . Intimate partner violence:    Fear of current or ex partner: Not on file    Emotionally abused: Not on file    Physically abused: Not on file    Forced sexual activity: Not on file  Other Topics Concern  . Not on file  Social History Narrative  . Not on file    I appreciate the opportunity to take part in Clodagh's care. Please do not hesitate to contact me with questions.  Sincerely,   R. Edgar Frisk, MD

## 2017-09-27 ENCOUNTER — Telehealth: Payer: Self-pay | Admitting: Allergy and Immunology

## 2017-09-27 DIAGNOSIS — R69 Illness, unspecified: Secondary | ICD-10-CM | POA: Diagnosis not present

## 2017-09-27 NOTE — Telephone Encounter (Signed)
Patient called stating she no longer takes Symbicort, but it is still listed on her medication list; she would like it removed. She was also told she needed to see and ENT. She was said she did not write down the doctors name. She would like this because she needs to check with her insurance to see if it will cover this doctor before she decides to make this appointment.

## 2017-09-27 NOTE — Telephone Encounter (Signed)
Symbicort discontinued and patient advised of ENT name.

## 2017-10-02 DIAGNOSIS — J4520 Mild intermittent asthma, uncomplicated: Secondary | ICD-10-CM | POA: Diagnosis not present

## 2017-10-04 ENCOUNTER — Telehealth: Payer: Self-pay

## 2017-10-04 DIAGNOSIS — G4733 Obstructive sleep apnea (adult) (pediatric): Secondary | ICD-10-CM | POA: Diagnosis not present

## 2017-10-04 NOTE — Telephone Encounter (Signed)
[facility] requesting alternative medication for Spiriva Respimat 1.25 mcg to Incruse Ellipta 62.5 mcg due to cost of medication. Please advise alternative request.

## 2017-10-04 NOTE — Telephone Encounter (Signed)
Are they asking if she can be switched to [place]? If so, yes she can be switched.

## 2017-10-05 MED ORDER — UMECLIDINIUM BROMIDE 62.5 MCG/INH IN AEPB: 1.0000 | INHALATION_SPRAY | Freq: Every day | RESPIRATORY_TRACT | 4 refills | Status: DC

## 2017-10-05 NOTE — Telephone Encounter (Signed)
Patient medication changed from spiriva to Lexa

## 2017-10-05 NOTE — Addendum Note (Signed)
Addended by: Valere Dross on: 10/05/2017 09:40 AM   Modules accepted: Orders

## 2017-10-06 DIAGNOSIS — R69 Illness, unspecified: Secondary | ICD-10-CM | POA: Diagnosis not present

## 2017-10-07 DIAGNOSIS — R69 Illness, unspecified: Secondary | ICD-10-CM | POA: Diagnosis not present

## 2017-10-11 ENCOUNTER — Telehealth: Payer: Self-pay | Admitting: Allergy and Immunology

## 2017-10-11 NOTE — Telephone Encounter (Signed)
According to telephone contact on 10-04-17 [facility] contacted office on behalf of patient to switch her Spiriva to Incruse due to cost. Informed patient of why the change. Patient states that pharmacy did not show her how to use the new inhaler. Informed patient that she could either come by the office and we would show her how to use the inhaler or she can take it back to the pharmacy and they can show her how to use it. Patient would also like an update AVS from her last visit with her medication changes.

## 2017-10-11 NOTE — Telephone Encounter (Signed)
Patient said her pharmacy called and said they had a prescription there for encruse elliptica. She said she doesn't know if this is for her, and if so, she would like someone to call her and instruct her on how to use it. She also in on spiriva and flovent. She doesn't know if she is supposed to take all three of these or just the encruse elliptica. She would also like a list of all her medications and what she is allergic to, mailed to her.

## 2017-10-18 DIAGNOSIS — R69 Illness, unspecified: Secondary | ICD-10-CM | POA: Diagnosis not present

## 2017-10-23 ENCOUNTER — Other Ambulatory Visit: Payer: Self-pay | Admitting: Allergy and Immunology

## 2017-10-23 NOTE — Progress Notes (Signed)
HPI:   [name] is a 62 y.o. female, who is here today for her routine physical.  Last CPE: 03/11/16. She is on disability due to bipolar disorder, generalized OA, and IC.  Regular exercise 3 or more time per week: 2-3 times per week walking the treadmill. Following a healthy diet: Yes She lives alone.  Chronic medical problems: Hypothyroidism,vaginal atrophy,migraine,allergic rhinitis,generaliazed OA,bipolar disorder,hypertriglyceridemia, IC,and OSA among some. Tremor,recently started on primidone, which has helped. She follows with dr Tat.   Hyperlipidemia:  Currently on Crestor 20 mg daily. Following a low fat diet: Yes.  Lab Results  Component Value Date   CHOL 163 07/01/2017   HDL 75.20 07/01/2017   LDLCALC 70 07/01/2017   LDLDIRECT 143.0 09/14/2016   TRIG 88.0 07/01/2017   CHOLHDL 2 07/01/2017    Pap smear: 10/13/16, negative. 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  G:0 M: 11 LMP in 2010,s/p endometrial ablation.  Immunization History  Administered Date(s) Administered  . Influenza Split 02/09/2012  . Influenza Whole 12/08/2006, 12/01/2007  . Influenza,inj,Quad PF,6+ Mos 12/11/2013, 11/21/2014, 10/13/2016, 10/24/2017  . Influenza-Unspecified 12/01/2015  . Pneumococcal Polysaccharide-23 12/12/2013  . Tdap 04/16/2011, 06/25/2013    Mammogram: 05/2017 Bi-Rads 1 Colonoscopy: Patient request review 03/2014, polypectomy.  She does not remember having colonoscopy this year, states that he has been "long time" since she had one. DEXA: 03/2016,osteoporosis.  She is not taking Fosamax weekly as recommended.  She is tolerating medication well but she cannot always take it as instructed because she gets up, goes to bed, and eats at different times.   She is not taking calcium supplementation.  Hep C screening: NR in 11/2014.  Follow up and concerns today:  Since her last visit she has had 2 episodes of vaginal  bleeding, she has seen a "red spot" on her lining pad. She has some intermittent lower abdominal pain but she thinks is related to her IC. 09/2014 she underwent endometrial biopsy, normal pathology.  She does not recall this visit or indication.  09/24/14 ENDOCERVICAL CURETTINGS: Scant fragments of unremarkable endocervical epithelium. Rare fragment of benign squamous epithelium. No dysplasia identified.   Hypothyroidism:  Currently on Synthroid 50 mcg daily . Tolerating medication well, no side effects reported. She has not noted palpitations,changes in bowel habits,worsening tremor, cold/heat intolerance, or abnormal weight loss.  Lab Results  Component Value Date   TSH 1.40 09/14/2016    Vitamin D deficiency: Currently she is on vitamin D 5000 units twice daily.  Independent ADL's and IADL's.   Functional Status Survey:  Is the patient deaf or have difficulty hearing?: No Does the patient have difficulty seeing, even when wearing glasses/contacts?: No Does the patient have difficulty concentrating, remembering, or making decisions?: No Does the patient have difficulty walking or climbing stairs?: No Does the patient have difficulty dressing or bathing?: No Does the patient have difficulty doing errands alone such as visiting a doctor's office or shopping?: No  Fall Risk  10/24/2017 07/08/2017 06/13/2015 05/30/2015 12/06/2014  Falls in the past year? Yes Yes Yes Yes Yes  Comment - - last fall  April 2017 - -  Number falls in past yr: 1 1 2  or more 2 or more 1  Injury with Fall? No No - - No  Risk Factor Category  - - High Fall Risk High Fall Risk -  Risk for fall due to : - - History of fall(s);Impaired balance/gait;Medication side  effect History of fall(s);Impaired balance/gait;Medication side effect -  Follow up - Falls evaluation completed Education provided;Falls prevention discussed Education provided;Falls prevention discussed -  Comment - - - handout given  -     Providers she sees regularly:  Eye care provider: She doe snot have one,she goes every 2 years to a different provider.Last visit 2018. Dr Maryann Conners (knee). Dr Olevia Perches, GI Dr Bobbitt,immunologiast. Dr Sood,pulmonologist.Pending appt Dr Jetty Duhamel Psychiatrist, Ms Luana Shu (NP)  Hx of bipolar disorder,depression,and anxiety. She follows with psychiatrist,so PHQ screening is not indicated.  Depression screen PHQ 2/9 10/24/2017  Decreased Interest 1  Down, Depressed, Hopeless 0  PHQ - 2 Score 1  Altered sleeping -  Tired, decreased energy -  Change in appetite -  Feeling bad or failure about yourself  -  Trouble concentrating -  Moving slowly or fidgety/restless -  Suicidal thoughts -  PHQ-9 Score -  Difficult doing work/chores -    Mini-Cog - 10/24/17 1506    Normal clock drawing test?  yes    How many words correct?  3       Hearing Screening   125Hz  250Hz  500Hz  1000Hz  2000Hz  3000Hz  4000Hz  6000Hz  8000Hz   Right ear:   Pass Pass Pass  Fail    Left ear:   Pass Pass Pass  Fail      Visual Acuity Screening   Right eye Left eye Both eyes  Without correction:     With correction: 20/30 20/20 20/20    Pre-op form received after visit, she is planning on having left total knee replacement on 12/26/17. Surgery clearance requested by Dr Wynelle Link. Requested labs: BMP,albumin,CBC,UA,EKG. Hx of OA,left knee pain getting worse.  She has had surgeries before with no serious complications. Hiatal hernia repair in 02/2017.  No Hx of CAD,DM,or HTN. HLD on Crestor 40 mg daily.   Review of Systems  Constitutional: Positive for fatigue. Negative for appetite change and fever.  HENT: Negative for dental problem, hearing loss, mouth sores, sore throat, trouble swallowing and voice change.   Eyes: Negative for redness and visual disturbance.  Respiratory: Negative for cough, shortness of breath and wheezing.   Cardiovascular: Negative for chest pain and leg swelling.    Gastrointestinal: Negative for abdominal pain, nausea and vomiting.       No changes in bowel habits.  Endocrine: Negative for cold intolerance, heat intolerance, polydipsia, polyphagia and polyuria.  Genitourinary: Positive for vaginal bleeding. Negative for decreased urine volume, dysuria (intermitent due to IC), hematuria, pelvic pain (intermittent, IC) and vaginal discharge.  Musculoskeletal: Positive for arthralgias and back pain. Negative for gait problem.  Skin: Negative for color change and rash.  Allergic/Immunologic: Positive for environmental allergies.  Neurological: Negative for syncope, weakness and headaches.  Hematological: Negative for adenopathy. Does not bruise/bleed easily.  Psychiatric/Behavioral: Positive for sleep disturbance. Negative for confusion and hallucinations. The patient is nervous/anxious.   All other systems reviewed and are negative.     Current Outpatient Medications on File Prior to Visit  Medication Sig Dispense Refill  . albuterol (PROVENTIL HFA;VENTOLIN HFA) 108 (90 Base) MCG/ACT inhaler Inhale 1-2 puffs into the lungs daily as needed for wheezing or shortness of breath. 54 Inhaler 1  . albuterol (PROVENTIL) (2.5 MG/3ML) 0.083% nebulizer solution TAKE 3 MLS BY NEBULIZATION EVERY 6 HOURS AS NEEDED FOR WHEEZING OR SHORTNESS OF BREATH. 75 mL 1  . alendronate (FOSAMAX) 70 MG tablet TAKE 1 TABLET BY MOUTH EVERY 7 DAYS WITH FULL GLASS OF WATER ON EMPTY STOMACH 12 tablet  2  . amitriptyline (ELAVIL) 10 MG tablet TAKE 1 TABLET BY MOUTH EVERYDAY AT BEDTIME  1  . anti-nausea (EMETROL) solution Take 30 mLs by mouth every 15 (fifteen) minutes as needed for nausea or vomiting.    . Azelastine HCl 0.15 % SOLN Place 2 sprays into the nose daily as needed. 30 mL 3  . Carbinoxamine Maleate 4 MG TABS Take 1 tablet (4 mg total) by mouth every 8 (eight) hours as needed. 28 each 1  . [employer] D3 5000 units capsule TAKE 2 TABLETS BY MOUTH EVERY DAY 60 capsule 0  .  diphenhydrAMINE (BENADRYL) 25 mg capsule Take 25 mg by mouth at bedtime as needed for sleep.    Marland Kitchen ELMIRON 100 MG capsule Take 100 mg by mouth 2 (two) times daily.   1  . estradiol (ESTRACE) 0.1 MG/GM vaginal cream Place 1 Applicatorful vaginally 3 (three) times a week. (Patient taking differently: Place 1 Applicatorful once a week vaginally. ) 42.5 g 6  . fluticasone (FLOVENT HFA) 220 MCG/ACT inhaler TAKE 2 PUFFS BY MOUTH TWICE A DAY 12 Inhaler 3  . gabapentin (NEURONTIN) 300 MG capsule Take 300 mg by mouth 3 (three) times daily.     . hydrocortisone cream 1 % Apply 1 application topically 2 (two) times daily as needed for itching.     . hydrOXYzine (ATARAX/VISTARIL) 25 MG tablet Take 25 mg by mouth 2 (two) times daily.  1  . ibuprofen (ADVIL,MOTRIN) 200 MG tablet Take 200-800 mg by mouth every 6 (six) hours as needed for headache or moderate pain. Depends on pain    . ipratropium (ATROVENT) 0.06 % nasal spray Place 2 sprays into both nostrils every 8 (eight) hours as needed for rhinitis. 15 mL 1  . levothyroxine (SYNTHROID, LEVOTHROID) 50 MCG tablet TAKE 1 TABLET EVERY DAY--- takes in am 90 tablet 1  . loperamide (IMODIUM A-D) 2 MG tablet Take 2 mg by mouth 4 (four) times daily as needed for diarrhea or loose stools.    Marland Kitchen loratadine (CLARITIN) 10 MG tablet Take 1 tablet (10 mg total) by mouth daily. (Patient taking differently: Take 10 mg by mouth every morning. ) 30 tablet 2  . mometasone (NASONEX) 50 MCG/ACT nasal spray Place 2 sprays into the nose every morning.    . montelukast (SINGULAIR) 10 MG tablet Take 1 tablet (10 mg total) by mouth at bedtime. (Patient taking differently: Take 10 mg by mouth at bedtime. ) 30 tablet 10  . naproxen sodium (ANAPROX) 220 MG tablet Take 4 tablets (880 mg total) by mouth 2 (two) times daily as needed (headache). 60 tablet 5  . primidone (MYSOLINE) 50 MG tablet Take 1 tablet (50 mg total) by mouth 2 (two) times daily. 180 tablet 1  . rizatriptan (MAXALT-MLT) 10  MG disintegrating tablet TAKE 1 TABLET EVERY DAY AS NEEDED FOR MIGRAINE, MAY REPEAT IN 2 HRS IF NEEDED. MAX 2 DOSES/WK 9 tablet 3  . rosuvastatin (CRESTOR) 20 MG tablet TAKE 1 TABLET BY MOUTH EVERY DAY 90 tablet 1  . sertraline (ZOLOFT) 100 MG tablet Take 2 tablets (200 mg total) by mouth daily. (Patient taking differently: Take 100 mg by mouth at bedtime. ) 14 tablet 0  . Spacer/Aero-Holding Chambers (AEROCHAMBER PLUS) inhaler Use as instructed 1 each 0  . Tiotropium Bromide Monohydrate (SPIRIVA RESPIMAT) 1.25 MCG/ACT AERS Inhale 1.25 mcg into the lungs daily. 4 g 5  . umeclidinium bromide (INCRUSE ELLIPTA) 62.5 MCG/INH AEPB Inhale 1 puff into the lungs daily. [place]  each 4  . ziprasidone (GEODON) 40 MG capsule Take 40 mg by mouth 2 (two) times daily.  2   No current facility-administered medications on file prior to visit.      Past Medical History:  Diagnosis Date  . Allergic rhinitis   . Benign essential tremor    hands  . Bipolar 1 disorder, mixed, moderate ([place])   . Bladder pain   . Claustrophobia   . Depression   . Eczema   . History of frequent urinary tract infections   . History of gastroesophageal reflux (GERD)    05-25-2017  per pt no issues since hiatal hernia repair 01/ 2018  . History of hiatal hernia   . History of melanoma excision    early 2000s-- BACK  . History of panic attacks   . 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 Hypothyroidism   . Interstitial cystitis   . Limited jaw range of motion    s/p  bilateral TMJ surgery,  age 84s  . Lower urinary tract symptoms (LUTS)   . Migraines   . Mild asthma   . OA (osteoarthritis)   . OSA on CPAP    per last sleep study 09/ 2017  mild OSA, AHI13/hr  . PONV (postoperative nausea and vomiting)   . Recurrent upper respiratory infection (URI)   . Rosacea   . Sensation of pressure in bladder area   . TMJ arthralgia   . Urticaria   . Wears glasses   . White coat syndrome without hypertension      05-25-2017  per pt hx hypertension yrs ago, no issues after quitting stressful job    Past Surgical History:  Procedure Laterality Date  . [place] STUDY N/A 09/22/2015   Procedure: [place] STUDY;  Surgeon: Doran Stabler, MD;  Location: [facility];  Service: Gastroenterology;  Laterality: N/A;  . ADENOIDECTOMY    . ANAL FISSURE REPAIR  age 74  (49)  . BREAST EXCISIONAL BIOPSY Right 04-16-2003  dr p. young  [place]   benign  . BUNIONECTOMY Right early 2000s  . CARPAL TUNNEL RELEASE Left age 27 (45)  . CHOLECYSTECTOMY OPEN  age 76  . CYSTO WITH HYDRODISTENSION N/A 06/02/2017   Procedure: CYSTOSCOPY/HYDRODISTENSION OF BLADDER;  Surgeon: Irine Seal, MD;  Location: [facility];  Service: Urology;  Laterality: N/A;  . CYSTO/ HYDRODISTENTION/  INSTILLATION THERAPY  1990s  . DX LAPAROSCOPY/  DX HYSTEROSCOPY/  D & C  08-07-2007   dr dove  [facility]  . ENDOMETRIAL ABLATION W/ NOVASURE  10-21-2008   dr Harolyn Rutherford  [facility]  . ESOPHAGEAL MANOMETRY N/A 09/22/2015   Procedure: ESOPHAGEAL MANOMETRY (EM);  Surgeon: Doran Stabler, MD;  Location: [facility];  Service: Gastroenterology;  Laterality: N/A;  with impedence   . FINGER ARTHROPLASTY Bilateral    left little finger 2016:  right middle finger 06/ 2018  . KNEE ARTHROSCOPY Bilateral x3 left ;  x3  right-- last one age 33 (45)  . NASAL SEPTUM SURGERY  age 61s  . ROBOT ASSISTED REDUCTION PARAESOPHAGEAL HIATAL HERNIA/ TYPE 2 MEDIASTINAL DISSECTION/ PRIMARY HIATAL HERNIA REPAIR/ ANTERIOR & POSTERIOR GASTROPEXY/ NISSEN FUNDOPLICATION  [id number]   DR GROSS  [facility]  . SINOSCOPY    . TEMPOROMANDIBULAR JOINT SURGERY Bilateral x3  -- age 50s   per pt used graft  . TONSILLECTOMY    . TRIGGER FINGER RELEASE Bilateral last one 2017   several  release's bilaterally  . ULNAR NERVE TRANSPOSITION Bilateral age 65 (1980)    Allergies  Allergen Reactions  . Amlodipine Swelling    Peripheral edema  . Pseudoephedrine Other (See Comments)     I fly off the walls   . Risperidone And Related Other (See Comments)    Stomach upset, insomnia, drooling, tremors, "jerks," sensitivity to touch.   . Propoxyphene Itching    DARVOCET  . Sulfa Antibiotics Other (See Comments)    Unknown reaction  . Talwin [Pentazocine]     Unknown reaction  . Aspirin Other (See Comments)    REACTION: upset stomach  . Bupivacaine Hcl Hives  . Codeine Itching  . Etodolac Rash  . Indocin [Indomethacin] Other (See Comments)    Muscle spasms  . Ivp Dye [Iodinated Diagnostic Agents] Itching    Flushed and Fever, itch all over  . Pentazocine Lactate Itching    oozing  . Propranolol Nausea Only  . Tramadol Hcl Other (See Comments)    unknown    Family History  Problem Relation Age of Onset  . Hypertension Mother        deceased from MVA complications  . Thyroid disease Mother   . Allergic rhinitis Mother   . Testicular cancer Father   . Allergic rhinitis Father   . Colon polyps Sister   . Coronary artery disease Unknown     Social History   Socioeconomic History  . Marital status: Divorced    Spouse name: Not on file  . Number of children: 0  . Years of education: Not on file  . Highest education level: Not on file  Occupational History  . Not on file  Social Needs  . Financial resource strain: Not on file  . Food insecurity:    Worry: Not on file    Inability: Not on file  . Transportation needs:    Medical: Not on file    Non-medical: Not on file  Tobacco Use  . Smoking status: Never Smoker  . Smokeless tobacco: Never Used  Substance and Sexual Activity  . Alcohol use: Yes    Alcohol/week: 0.0 standard drinks    Comment: once every 3-4 months  . Drug use: No  . Sexual activity: Not on file  Lifestyle  . Physical activity:    Days per week: Not on file    Minutes per session: Not on file  . Stress: Not on file  Relationships  . Social connections:    Talks on phone: Not on file    Gets together: Not on file    Attends  religious service: Not on file    Active member of club or organization: Not on file    Attends meetings of clubs or organizations: Not on file    Relationship status: Not on file  Other Topics Concern  . Not on file  Social History Narrative  . Not on file     Vitals:   10/24/17 1351  BP: 120/82  Pulse: 97  Resp: 12  Temp: 98.4 F (36.9 C)  SpO2: 97%   Body mass index is 32.87 kg/m.   Wt Readings from Last 3 Encounters:  10/24/17 171 lb 2 oz (77.6 kg)  08/01/17 173 lb 9.6 oz (78.7 kg)  07/12/17 175 lb 4 oz (79.5 kg)     Physical Exam  Nursing note and vitals reviewed. Constitutional: She is oriented to person, place, and time. She appears well-developed. No distress.  HENT:  Head: Normocephalic and atraumatic.  Right Ear: Tympanic membrane, external ear and ear canal normal.  Left Ear: Tympanic membrane, external ear and ear canal normal.  Mouth/Throat: Oropharynx is clear and moist and mucous membranes are normal.  Eyes: Pupils are equal, round, and reactive to light. Conjunctivae are normal.  Cardiovascular: Normal rate and regular rhythm.  No murmur heard. Pulses:      Dorsalis pedis pulses are 2+ on the right side, and 2+ on the left side.  Respiratory: Effort normal and breath sounds normal. No respiratory distress.  GI: Soft. She exhibits no mass. There is no hepatomegaly. There is no tenderness.  Genitourinary: There is no rash, tenderness or lesion on the right labia. There is no rash, tenderness or lesion on the left labia. Uterus is not enlarged and not tender. Cervix exhibits discharge and friability. Cervix exhibits no motion tenderness. Right adnexum displays no mass, no tenderness and no fullness. Left adnexum displays no mass, no tenderness and no fullness. No erythema in the vagina. Vaginal discharge ([place]) found.  Genitourinary Comments: Breast: No skin abnormalities,no nipple discharge,and fibrocystic changes outer upper quadrant,  bilateral.  Vaginal atrophy. Pap smear collected.   Musculoskeletal: She exhibits no edema.  Lymphadenopathy:    She has no cervical adenopathy.    She has no axillary adenopathy.  Neurological: She is alert and oriented to person, place, and time. She has normal strength. No cranial nerve deficit. Gait normal.  Reflex Scores:      Bicep reflexes are 2+ on the right side and 2+ on the left side.      Patellar reflexes are 2+ on the right side and 2+ on the left side. Skin: Skin is warm. No rash noted. No erythema.  Psychiatric: She has a normal mood and affect.  Well groomed, good eye contact.      ASSESSMENT AND PLAN:  [name] was here today annual physical examination.   Orders Placed This Encounter  Procedures  . US PELVIC COMPLETE WITH TRANSVAGINAL  . Flu Vaccine QUAD 36+ mos IM  . TSH  . VITAMIN D 25 Hydroxy (Vit-D Deficiency, Fractures)  . Basic metabolic panel  . Hemoglobin A1c  . Hepatic function panel  . CBC   Lab Results  Component Value Date   WBC 10.3 10/24/2017   HGB 13.2 10/24/2017   HCT 39.2 10/24/2017   MCV 92.1 10/24/2017   PLT 253.0 10/24/2017   Lab Results  Component Value Date   HGBA1C 6.1 10/24/2017   Lab Results  Component Value Date   ALT 22 10/24/2017   AST 29 10/24/2017   ALKPHOS 98 10/24/2017   BILITOT 0.6 10/24/2017   Lab Results  Component Value Date   CREATININE 1.14 10/24/2017   BUN 10 10/24/2017   NA 139 10/24/2017   K 4.6 10/24/2017   CL 100 10/24/2017   CO2 31 10/24/2017   Lab Results  Component Value Date   TSH 0.46 10/24/2017     Routine general medical examination at a health care facility  We discussed the importance of regular physical activity and healthy diet for prevention of chronic illness and/or complications. Preventive guidelines reviewed. Vaccination up to date.  Ca++ and vit D supplementation to continue. Next CPE in a year.  Medicare annual wellness visit, initial  We  discussed the importance of staying active, physically and mentally, as well as the benefits of a healthy/balance diet. Low impact exercise that involve stretching and strengthing are ideal. Vaccines: Influenza vaccine given today. We discussed  preventive screening for the next 5-10 years, summery of recommendations given in AVS:   Fall prevention. Pap smear due in 2021 Mammogram every 1-2 years. Colonoscopy due in 2026, we reviewed report of last colonoscopy. DEXA at age 68. Annual influenza vaccine. Tdap 2025. Shingrex Rx given today.  Advance directives and end of life discussed, she does not have power of attorney or living with, strongly recommend both.  Information given in her AVS.   DUB (dysfunctional uterine bleeding)  Possible etiologies discussed. She may have had same problem in the past, endometrial biopsy negative in 2016. Further recommendations will be given according to US pelvic/transvaginal. If she has recurrent episodes, referral to gynecology will be placed regardless of [place] results.  -     Cytology - PAP ([place]) -     US PELVIC COMPLETE WITH TRANSVAGINAL; Future  Cervical cancer screening -     Cytology - PAP ([place])  History of HPV infection -     Cytology - PAP ([place])  Need for immunization against influenza -     Flu Vaccine QUAD 36+ mos IM  Pre-op testing -     Hemoglobin A1c -     Hepatic function panel -     CBC  Other orders -     Zoster Vaccine Adjuvanted [facility]) injection; 0.5 ml in muscle and repeat in 8 weeks -     Cancel: Comprehensive metabolic panel   Hypothyroidism No changes in current management, will follow labs done today and will give further recommendations accordingly. Follow-up annually.  Vitamin D deficiency, unspecified No changes in vitamin D dose, 5000 units twice daily. Further recommendations will be given according to 25 [place] vitamin D results.  Osteoporosis without current pathological  fracture Calcium 1000 to 1200 mg recommended, ideally through her diet. We discussed some side effects of Fosamax, recommend trying to take it weekly as prescribed. Other options discussed, Reclast or Prolia.  She will continue following with psychiatrist for bipolar disorder, depression, and anxiety.    She left a form from Dr. Edmonia Lynch to be completed, she is planning on having left total knee replacement on 12/12/2017. Requested labs are EKG, CBC, BMP, albumin, A1c, and UA.  I was able to add some labs to draw blood, we will need to bring her back for EKG and UA.    Return in 1 year (on 10/25/2018) for CPE and medicare and f/u.       Capria Cartaya G. [place], MD  [facility]. [place] office.

## 2017-10-24 ENCOUNTER — Encounter: Payer: Self-pay | Admitting: Family Medicine

## 2017-10-24 ENCOUNTER — Other Ambulatory Visit: Payer: Self-pay | Admitting: Allergy

## 2017-10-24 ENCOUNTER — Ambulatory Visit (INDEPENDENT_AMBULATORY_CARE_PROVIDER_SITE_OTHER): Payer: Medicare HMO | Admitting: Family Medicine

## 2017-10-24 ENCOUNTER — Telehealth: Payer: Self-pay | Admitting: Allergy

## 2017-10-24 ENCOUNTER — Other Ambulatory Visit (HOSPITAL_COMMUNITY)
Admission: RE | Admit: 2017-10-24 | Discharge: 2017-10-24 | Disposition: A | Discharge status: Home / Self Care | Payer: Medicare HMO | Source: Ambulatory Visit | Attending: Family Medicine | Admitting: Family Medicine

## 2017-10-24 VITALS — BP 120/82 | HR 97 | Temp 98.4°F | Resp 12 | Ht 60.5 in | Wt 171.1 lb

## 2017-10-24 DIAGNOSIS — Z8619 Personal history of other infectious and parasitic diseases: Secondary | ICD-10-CM | POA: Diagnosis not present

## 2017-10-24 DIAGNOSIS — N938 Other specified abnormal uterine and vaginal bleeding: Secondary | ICD-10-CM | POA: Insufficient documentation

## 2017-10-24 DIAGNOSIS — Z23 Encounter for immunization: Secondary | ICD-10-CM

## 2017-10-24 DIAGNOSIS — M810 Age-related osteoporosis without current pathological fracture: Secondary | ICD-10-CM

## 2017-10-24 DIAGNOSIS — Z01818 Encounter for other preprocedural examination: Secondary | ICD-10-CM

## 2017-10-24 DIAGNOSIS — Z124 Encounter for screening for malignant neoplasm of cervix: Secondary | ICD-10-CM

## 2017-10-24 DIAGNOSIS — E039 Hypothyroidism, unspecified: Secondary | ICD-10-CM | POA: Diagnosis not present

## 2017-10-24 DIAGNOSIS — Z0000 Encounter for general adult medical examination without abnormal findings: Secondary | ICD-10-CM

## 2017-10-24 DIAGNOSIS — R69 Illness, unspecified: Secondary | ICD-10-CM | POA: Diagnosis not present

## 2017-10-24 DIAGNOSIS — E559 Vitamin D deficiency, unspecified: Secondary | ICD-10-CM

## 2017-10-24 DIAGNOSIS — Z01419 Encounter for gynecological examination (general) (routine) without abnormal findings: Secondary | ICD-10-CM | POA: Diagnosis not present

## 2017-10-24 LAB — CBC
HCT: 39.2 % (ref 36.0–46.0)
Hemoglobin: 13.2 g/dL (ref 12.0–15.0)
MCHC: 33.6 g/dL (ref 30.0–36.0)
MCV: 92.1 fl (ref 78.0–100.0)
PLATELETS: 253 10*3/uL (ref 150.0–400.0)
RBC: 4.26 Mil/uL (ref 3.87–5.11)
RDW: 15.4 % (ref 11.5–15.5)
WBC: 10.3 10*3/uL (ref 4.0–10.5)

## 2017-10-24 LAB — BASIC METABOLIC PANEL
BUN: 10 mg/dL (ref 6–23)
CALCIUM: 9.8 mg/dL (ref 8.4–10.5)
CO2: 31 mEq/L (ref 19–32)
Chloride: 100 mEq/L (ref 96–112)
Creatinine, Ser: 1.14 mg/dL (ref 0.40–1.20)
GFR: 51.51 mL/min — AB (ref >60.00)
GLUCOSE: 102 mg/dL — AB (ref 70–99)
Potassium: 4.6 mEq/L (ref 3.5–5.1)
SODIUM: 139 meq/L (ref 135–145)

## 2017-10-24 LAB — HEPATIC FUNCTION PANEL
ALBUMIN: 4.3 g/dL (ref 3.5–5.2)
ALT: 22 U/L (ref 0–35)
AST: 29 U/L (ref 0–37)
Alkaline Phosphatase: 98 U/L (ref 39–117)
Bilirubin, Direct: 0.1 mg/dL (ref 0.0–0.3)
TOTAL PROTEIN: 6.8 g/dL (ref 6.0–8.3)
Total Bilirubin: 0.6 mg/dL (ref 0.2–1.2)

## 2017-10-24 LAB — TSH: TSH: 0.46 u[IU]/mL (ref 0.35–4.50)

## 2017-10-24 LAB — VITAMIN D 25 HYDROXY (VIT D DEFICIENCY, FRACTURES): VITD: 75.57 ng/mL (ref 30.00–100.00)

## 2017-10-24 LAB — HEMOGLOBIN A1C: Hgb A1c MFr Bld: 6.1 % (ref 4.6–6.5)

## 2017-10-24 MED ORDER — ZOSTER VAC RECOMB ADJUVANTED 50 MCG/0.5ML IM SUSR: INTRAMUSCULAR | 1 refills | Status: DC

## 2017-10-24 NOTE — Assessment & Plan Note (Signed)
No changes in vitamin D dose, 5000 units twice daily. Further recommendations will be given according to 25 [place] vitamin D results.

## 2017-10-24 NOTE — Assessment & Plan Note (Signed)
No changes in current management, will follow labs done today and will give further recommendations accordingly. Follow-up annually.

## 2017-10-24 NOTE — Assessment & Plan Note (Signed)
Calcium 1000 to 1200 mg recommended, ideally through her diet. We discussed some side effects of Fosamax, recommend trying to take it weekly as prescribed. Other options discussed, Reclast or Prolia.

## 2017-10-24 NOTE — Patient Instructions (Addendum)
A few things to remember from today's visit:   Routine general medical examination at a health care facility  Medicare annual wellness visit, initial  Hypothyroidism, unspecified type - Plan: TSH  Abnormal liver function test - Plan: Comprehensive metabolic panel  Mixed hyperlipidemia  DUB (dysfunctional uterine bleeding) - Plan: Cytology - PAP ([place]), US PELVIC COMPLETE WITH TRANSVAGINAL  Cervical cancer screening - Plan: Cytology - PAP ([place])  History of HPV infection - Plan: Cytology - PAP ([place])  Vitamin D deficiency, unspecified - Plan: VITAMIN D 25 Hydroxy (Vit-D Deficiency, Fractures)  A few tips:  -As we age balance is not as good as it was, so there is a higher risks for falls. Please remove small rugs and furniture that is "in your way" and could increase the risk of falls. Stretching exercises may help with fall prevention: Yoga and Tai Chi are some examples. Low impact exercise is better, so you are not very achy the next day.  -Sun screen and avoidance of direct sun light recommended. Caution with dehydration, if working outdoors be sure to drink enough fluids.  - Some medications are not safe as we age, increases the risk of side effects and can potentially interact with other medication you are also taken;  including some of over the counter medications. Be sure to let me know when you start a new medication even if it is a dietary/vitamin supplement.   -Healthy diet low in red meet/animal fat and sugar + regular physical activity is recommended.    Fall prevention   Advance directives:  Please see a lawyer and/or go to this website to help you with advanced directives and designating a health care power of attorney so that your wishes will be followed should you become too ill to make your own medical decisions.  [email or web address]    Screening schedule for the next 5-10 years:   Pap smear today,if normal it can be  repeated in 5 years. Mammogram every 1-2 years. Colonoscopy due in 2026. According to records you had a colonoscopy in 03/2014. DEXA at age 67. Annual influenza vaccine. Tdap in 2025. Annual Flu vacine.  Shingrex Rx given today.   Please be sure medication list is accurate. If a new problem present, please set up appointment sooner than planned today.

## 2017-10-24 NOTE — Telephone Encounter (Signed)
[place]   from [facility] called and wanted up date on meds for patient Gave the to  Her. They  had Sudfad  As an allergy that we didn't have. Also they had her taking Estrapisl .02% cream

## 2017-10-25 ENCOUNTER — Other Ambulatory Visit: Payer: Self-pay | Admitting: Family Medicine

## 2017-10-25 DIAGNOSIS — N952 Postmenopausal atrophic vaginitis: Secondary | ICD-10-CM

## 2017-10-26 ENCOUNTER — Other Ambulatory Visit: Payer: Self-pay | Admitting: *Deleted

## 2017-10-26 DIAGNOSIS — Z01818 Encounter for other preprocedural examination: Secondary | ICD-10-CM

## 2017-10-26 DIAGNOSIS — M1712 Unilateral primary osteoarthritis, left knee: Secondary | ICD-10-CM | POA: Diagnosis not present

## 2017-10-27 LAB — CYTOLOGY - PAP
Diagnosis: NEGATIVE
HPV: NOT DETECTED

## 2017-10-31 ENCOUNTER — Other Ambulatory Visit: Payer: Self-pay

## 2017-11-01 DIAGNOSIS — R69 Illness, unspecified: Secondary | ICD-10-CM | POA: Diagnosis not present

## 2017-11-01 DIAGNOSIS — F3132 Bipolar disorder, current episode depressed, moderate: Secondary | ICD-10-CM | POA: Diagnosis not present

## 2017-11-02 ENCOUNTER — Ambulatory Visit
Admission: RE | Admit: 2017-11-02 | Discharge: 2017-11-02 | Disposition: A | Discharge status: Home / Self Care | Payer: Medicare HMO | Source: Ambulatory Visit | Attending: Family Medicine | Admitting: Family Medicine

## 2017-11-02 ENCOUNTER — Other Ambulatory Visit: Payer: Self-pay | Admitting: Family Medicine

## 2017-11-02 DIAGNOSIS — J4520 Mild intermittent asthma, uncomplicated: Secondary | ICD-10-CM | POA: Diagnosis not present

## 2017-11-02 DIAGNOSIS — E559 Vitamin D deficiency, unspecified: Secondary | ICD-10-CM

## 2017-11-02 DIAGNOSIS — N938 Other specified abnormal uterine and vaginal bleeding: Secondary | ICD-10-CM

## 2017-11-02 DIAGNOSIS — N939 Abnormal uterine and vaginal bleeding, unspecified: Secondary | ICD-10-CM | POA: Diagnosis not present

## 2017-11-04 ENCOUNTER — Telehealth: Payer: Self-pay | Admitting: Allergy and Immunology

## 2017-11-04 NOTE — Progress Notes (Signed)
Subjective:   WILLISHA Whitney was seen in consultation in the movement disorder clinic at the request of [place], Malka So, MD.  The evaluation is for tremor.  The patient is a 62 y.o. left handed female with a history of tremor.  Pt reports that tremor started many years ago; she recalls that intermittently throughout her life she would be tremulous when she was stressed or singing in crowds or doing pageants as a child.  However, since she has gotten older it has gotten worse and even if she is the "least bit anxious" she will be tremulous and it may last for days.  She states that some days she will be more tremulous more than others.  There is no known family hx of tremor (thinks that sister may but she doesn't know).  States that her paternal GM had PD.  She was admitted to psychiatry at the end of 2013-2014 to behavioral health and c/o tremor.  She was told that her tremor was due to stress/psychogenic according to the patient.  I cannot find notes about this but did find notes about tremor.  Affected by caffeine:  Unknown (rarely drinks caffeine) Affected by alcohol:  Rarely drinks Affected by stress:  Yes.   Affected by fatigue:  No., not unless very fatigued Spills soup if on spoon:  Yes.   (and has trouble eating peas) Spills glass of liquid if full:  Yes.  , if glass is too full Affects ADL's (tying shoes, brushing teeth, etc):  No., although will note tremor with zipping zippers and with measuring food when baking)  Current/Previously tried tremor medications: primidone (put on it by PA at [facility] per the patient - helped sometimes but not all the time - ran out of the medication and so is now off of it)  Current medications that may exacerbate tremor:  Lithium/VPA/geodon/albuterol  (albuterol will make her tremor so doesn't use it often; been on lithium since 1987; been on geodon for 3 months for hallucinations - visual and auditory)  07/08/17 update: Patient is  seen today in follow-up.  I have not seen her since 2017.  At that point, she was on multiple medications that can exacerbate tremor.  She was started on propranolol as needed.  I did ask her to follow-up with me in 6 months, but she did not return until today.  Records are reviewed.  She is off of lithium, but she is still on Geodon. Didn't notice a difference when getting off of lithium.  She states that the tremors interfere with her doing things.  She can awaken with them.  She believes she had "a problem"  with propranolol but cannot recall what the SE was.  Trouble eating, writing.   On primidone years ago and it seemed to help some, but when she ran out of it she did not take it any longer.  She recently saw the nurse practitioner at the pulmonary practice and was noncompliant with CPAP at the time.  11/08/17 update: Patient follows up today for tremor.  She was restarted back on primidone, 50 mg twice per day last visit.  She reports that she is doing much better; "I am so relieved."  Remains on Geodon.  Records are reviewed since our last visit.  She is scheduled for knee surgery on December 26, 2017.  Had cat bite and scratch her.  Saw PCP yesterday and on abx now.  Outside reports reviewed: historical medical records, lab  reports and referral letter/letters.  Allergies  Allergen Reactions  . Indomethacin Other (See Comments)    Muscle spasms Causes muscle spasms in neck  . Amlodipine Swelling    Peripheral edema  . Pseudoephedrine Other (See Comments)    I fly off the walls  CANNOT SLEEP  . Risperidone And Related Other (See Comments)    Stomach upset, insomnia, drooling, tremors, "jerks," sensitivity to touch.   . Bupivacaine Other (See Comments)    Will have hives  . Hydrocodone-Acetaminophen Other (See Comments)  . Pentazocine Other (See Comments)    Unknown reaction MADE ME "LACTATE"  . Propoxyphene Itching and Nausea And Vomiting    DARVOCET  . Risperidone Other (See  Comments)    UNKNOWN REACTION  . Sulfa Antibiotics Other (See Comments)    Unknown reaction  . Tramadol Other (See Comments)    Patient can not remember  . Aspirin Other (See Comments)    REACTION: upset stomach Cannot recall reaction.   . Bupivacaine Hcl Hives  . Codeine Itching and Other (See Comments)    Anything related to codeine  . Etodolac Rash and Other (See Comments)    UNKNOWN REACTION  . Ivp Dye [Iodinated Diagnostic Agents] Itching    Flushed and Fever, itch all over  . Pentazocine Lactate Itching    oozing  . Propranolol Nausea Only and Other (See Comments)  . Tramadol Hcl Other (See Comments)    unknown    Outpatient Encounter Medications as of 11/08/2017  Medication Sig  . albuterol (PROVENTIL HFA;VENTOLIN HFA) 108 (90 Base) MCG/ACT inhaler Inhale 1-2 puffs into the lungs daily as needed for wheezing or shortness of breath.  Marland Kitchen albuterol (PROVENTIL) (2.5 MG/3ML) 0.083% nebulizer solution TAKE 3 MLS BY NEBULIZATION EVERY 6 HOURS AS NEEDED FOR WHEEZING OR SHORTNESS OF BREATH.  Marland Kitchen alendronate (FOSAMAX) 70 MG tablet TAKE 1 TABLET BY MOUTH EVERY 7 DAYS WITH FULL GLASS OF WATER ON EMPTY STOMACH  . amitriptyline (ELAVIL) 10 MG tablet TAKE 1 TABLET BY MOUTH EVERYDAY AT BEDTIME  . amoxicillin (AMOXIL) 500 MG tablet Take 500 mg by mouth 2 (two) times daily.  Marland Kitchen amoxicillin-clavulanate (AUGMENTIN) 875-125 MG tablet Take 1 tablet by mouth 2 (two) times daily for 7 days.  Marland Kitchen anti-nausea (EMETROL) solution Take 30 mLs by mouth every 15 (fifteen) minutes as needed for nausea or vomiting.  . Azelastine HCl 0.15 % SOLN Place 2 sprays into the nose daily as needed.  . Carbinoxamine Maleate 4 MG TABS Take 1 tablet (4 mg total) by mouth every 8 (eight) hours as needed.  . [employer] D3 5000 units capsule TAKE 2 TABLETS BY MOUTH EVERY DAY  . ELMIRON 100 MG capsule Take 100 mg by mouth 2 (two) times daily.   Marland Kitchen estradiol (ESTRACE) 0.1 MG/GM vaginal cream Place 1 Applicatorful vaginally once a week.    . fluticasone (FLOVENT HFA) 220 MCG/ACT inhaler TAKE 2 PUFFS BY MOUTH TWICE A DAY  . gabapentin (NEURONTIN) 300 MG capsule Take 300 mg by mouth 3 (three) times daily.   . hydrocortisone cream 1 % Apply 1 application topically 2 (two) times daily as needed for itching.   . hydrOXYzine (ATARAX/VISTARIL) 25 MG tablet Take 25 mg by mouth 2 (two) times daily.  Marland Kitchen ibuprofen (ADVIL,MOTRIN) 200 MG tablet Take 200-800 mg by mouth every 6 (six) hours as needed for headache or moderate pain. Depends on pain  . ipratropium (ATROVENT) 0.06 % nasal spray Place 2 sprays into both nostrils every 8 (eight) hours  as needed for rhinitis.  Marland Kitchen levothyroxine (SYNTHROID, LEVOTHROID) 50 MCG tablet TAKE 1 TABLET EVERY DAY--- takes in am  . loperamide (IMODIUM A-D) 2 MG tablet Take 2 mg by mouth 4 (four) times daily as needed for diarrhea or loose stools.  . mometasone (NASONEX) 50 MCG/ACT nasal spray Place 2 sprays into the nose every morning.  . montelukast (SINGULAIR) 10 MG tablet Take 1 tablet (10 mg total) by mouth at bedtime. (Patient taking differently: Take 10 mg by mouth at bedtime. )  . naproxen sodium (ANAPROX) 220 MG tablet Take 4 tablets (880 mg total) by mouth 2 (two) times daily as needed (headache).  . primidone (MYSOLINE) 50 MG tablet Take 1 tablet (50 mg total) by mouth 2 (two) times daily.  . rizatriptan (MAXALT-MLT) 10 MG disintegrating tablet TAKE 1 TABLET EVERY DAY AS NEEDED FOR MIGRAINE, MAY REPEAT IN 2 HRS IF NEEDED. MAX 2 DOSES/WK  . rosuvastatin (CRESTOR) 20 MG tablet TAKE 1 TABLET BY MOUTH EVERY DAY  . sertraline (ZOLOFT) 100 MG tablet Take 2 tablets (200 mg total) by mouth daily. (Patient taking differently: Take 100 mg by mouth at bedtime. )  . Tiotropium Bromide Monohydrate (SPIRIVA RESPIMAT) 1.25 MCG/ACT AERS Inhale 1.25 mcg into the lungs daily.  Marland Kitchen umeclidinium bromide (INCRUSE ELLIPTA) 62.5 MCG/INH AEPB Inhale 1 puff into the lungs daily.  . ziprasidone (GEODON) 40 MG capsule Take 40 mg by  mouth 2 (two) times daily.  Marland Kitchen Zoster Vaccine Adjuvanted [facility]) injection 0.5 ml in muscle and repeat in 8 weeks  . [DISCONTINUED] budesonide-formoterol (SYMBICORT) 80-4.5 MCG/ACT inhaler Inhale into the lungs.  . [DISCONTINUED] diphenhydrAMINE (BENADRYL) 25 mg capsule Take 25 mg by mouth at bedtime as needed for sleep.  . [DISCONTINUED] loratadine (CLARITIN) 10 MG tablet Take 1 tablet (10 mg total) by mouth daily. (Patient taking differently: Take 10 mg by mouth every morning. )  . [DISCONTINUED] Spacer/Aero-Holding Chambers (AEROCHAMBER PLUS) inhaler Use as instructed   No facility-administered encounter medications on file as of 11/08/2017.     Past Medical History:  Diagnosis Date  . Allergic rhinitis   . Benign essential tremor    hands  . Bipolar 1 disorder, mixed, moderate ([place])   . Bladder pain   . Claustrophobia   . Depression   . Eczema   . History of frequent urinary tract infections   . History of gastroesophageal reflux (GERD)    05-25-2017  per pt no issues since hiatal hernia repair 01/ 2018  . History of hiatal hernia   . History of melanoma excision    early 2000s-- BACK  . History of panic attacks   . 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 Hypothyroidism   . Interstitial cystitis   . Limited jaw range of motion    s/p  bilateral TMJ surgery,  age 71s  . Lower urinary tract symptoms (LUTS)   . Migraines   . Mild asthma   . OA (osteoarthritis)   . OSA on CPAP    per last sleep study 09/ 2017  mild OSA, AHI13/hr  . PONV (postoperative nausea and vomiting)   . Recurrent upper respiratory infection (URI)   . Rosacea   . Sensation of pressure in bladder area   . TMJ arthralgia   . Urticaria   . Wears glasses   . White coat syndrome without hypertension    05-25-2017  per pt hx hypertension yrs ago, no issues after quitting stressful job  Past Surgical History:  Procedure Laterality Date  . [place] STUDY N/A 09/22/2015    Procedure: [place] STUDY;  Surgeon: Doran Stabler, MD;  Location: [facility];  Service: Gastroenterology;  Laterality: N/A;  . ADENOIDECTOMY    . ANAL FISSURE REPAIR  age 33  (46)  . BREAST EXCISIONAL BIOPSY Right 04-16-2003  dr p. young  [place]   benign  . BUNIONECTOMY Right early 2000s  . CARPAL TUNNEL RELEASE Left age 33 (76)  . CHOLECYSTECTOMY OPEN  age 46  . CYSTO WITH HYDRODISTENSION N/A 06/02/2017   Procedure: CYSTOSCOPY/HYDRODISTENSION OF BLADDER;  Surgeon: Irine Seal, MD;  Location: [facility];  Service: Urology;  Laterality: N/A;  . CYSTO/ HYDRODISTENTION/  INSTILLATION THERAPY  1990s  . DX LAPAROSCOPY/  DX HYSTEROSCOPY/  D & C  08-07-2007   dr dove  [facility]  . ENDOMETRIAL ABLATION W/ NOVASURE  10-21-2008   dr Harolyn Rutherford  [facility]  . ESOPHAGEAL MANOMETRY N/A 09/22/2015   Procedure: ESOPHAGEAL MANOMETRY (EM);  Surgeon: Doran Stabler, MD;  Location: [facility];  Service: Gastroenterology;  Laterality: N/A;  with impedence   . FINGER ARTHROPLASTY Bilateral    left little finger 2016:  right middle finger 06/ 2018  . KNEE ARTHROSCOPY Bilateral x3 left ;  x3  right-- last one age 59 (41)  . NASAL SEPTUM SURGERY  age 32s  . ROBOT ASSISTED REDUCTION PARAESOPHAGEAL HIATAL HERNIA/ TYPE 2 MEDIASTINAL DISSECTION/ PRIMARY HIATAL HERNIA REPAIR/ ANTERIOR & POSTERIOR GASTROPEXY/ NISSEN FUNDOPLICATION  [birth year]1937   DR GROSS  [facility]  . SINOSCOPY    . TEMPOROMANDIBULAR JOINT SURGERY Bilateral x3  -- age 75s   per pt used graft  . TONSILLECTOMY    . TRIGGER FINGER RELEASE Bilateral last one 2017   several release's bilaterally  . ULNAR NERVE TRANSPOSITION Bilateral age 6 (56)    Social History   Socioeconomic History  . Marital status: Divorced    Spouse name: Not on file  . Number of children: 0  . Years of education: Not on file  . Highest education level: Not on file  Occupational History  . Not on file  Social Needs  . Financial resource strain: Not on file  .  Food insecurity:    Worry: Not on file    Inability: Not on file  . Transportation needs:    Medical: Not on file    Non-medical: Not on file  Tobacco Use  . Smoking status: Never Smoker  . Smokeless tobacco: Never Used  Substance and Sexual Activity  . Alcohol use: Yes    Alcohol/week: 0.0 standard drinks    Comment: once every 3-4 months  . Drug use: No  . Sexual activity: Not on file  Lifestyle  . Physical activity:    Days per week: Not on file    Minutes per session: Not on file  . Stress: Not on file  Relationships  . Social connections:    Talks on phone: Not on file    Gets together: Not on file    Attends religious service: Not on file    Active member of club or organization: Not on file    Attends meetings of clubs or organizations: Not on file    Relationship status: Not on file  . Intimate partner violence:    Fear of current or ex partner: Not on file    Emotionally abused: Not on file    Physically abused: Not on file  Forced sexual activity: Not on file  Other Topics Concern  . Not on file  Social History Narrative  . Not on file    Family Status  Relation Name Status  . Mother  Deceased       HTN, thyroid  . Father  Deceased       testicular cancer  . Sister sally Alive       colon polyps  . Unknown  (Not Specified)    Review of Systems Review of Systems  Constitutional: Negative.   HENT: Negative.   Eyes: Negative.   Respiratory: Negative.   Cardiovascular: Negative.   Musculoskeletal: Positive for joint pain (knees bilaterally).  Skin:       Scratches from cat and cat bite on L hand     Objective:   VITALS:   Vitals:   11/08/17 1420  BP: 138/74  Pulse: (!) 116  SpO2: 92%  Weight: 166 lb (75.3 kg)  Height: 5' 0.5" (1.537 m)    GEN:  The patient appears stated age and is in NAD. HEENT:  Normocephalic, atraumatic.  The mucous membranes are moist. The superficial temporal arteries are without ropiness or tenderness. CV:   Tachycardic.  regular Lungs:  CTAB Neck/HEME:  There are no carotid bruits bilaterally.  Neurological examination:  Orientation: The patient is alert and oriented x3. Cranial nerves: There is good facial symmetry.  There is facial hypomimia.  The speech is fluent and clear. Soft palate rises symmetrically and there is no tongue deviation. Hearing is intact to conversational tone. Sensation: Sensation is intact to light touch throughout Motor: Strength is 5/5 in the bilateral upper and lower extremities.   Shoulder shrug is equal and symmetric.  There is no pronator drift.  Movement examination: Tone: There is normal tone in UE/LE Abnormal movements: Minimal tremor of the outstretched hands.  This does not change with intention.  Archimedes spirals are actually drawn quite well today.  She only spills a little bit of water when asked to pour from one glass to another. Coordination:  There is no decremation with RAM's, with any form of RAMS, including alternating supination and pronation of the forearm, hand opening and closing, finger taps, heel taps and toe taps. Gait and Station: The patient has no difficulty arising out of a deep-seated chair without the use of the hands.  She is stooped slightly at the waist.  She is short stepped.  Lab Results  Component Value Date   TSH 0.46 10/24/2017     Chemistry      Component Value Date/Time   NA 139 10/24/2017 1524   K 4.6 10/24/2017 1524   CL 100 10/24/2017 1524   CO2 31 10/24/2017 1524   BUN 10 10/24/2017 1524   CREATININE 1.14 10/24/2017 1524   CREATININE 1.00 11/21/2014 0959      Component Value Date/Time   CALCIUM 9.8 10/24/2017 1524   ALKPHOS 98 10/24/2017 1524   AST 29 10/24/2017 1524   ALT 22 10/24/2017 1524   BILITOT 0.6 10/24/2017 1524         Assessment/Plan:   1.Tremor.  -She is now off of lithium and Depakote, but still has Geodon present.  Reminded her that this can cause tremor.  Also reminded her that this can  cause parkinsonism.  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    -Patient does think that primidone has  helped dramatically.  She is on 50 mg twice per day.  We will continue that.  Refills were provided.   2.  Tachycardia  -She is tachycardic here today and also her primary care office yesterday.  She appears to be sinus tachycardia.  I did send a note to her primary care physician about this since she is getting ready to have surgery on her knee next month. 3.  F/u 6-8 months.  Much greater than 50% of this visit was spent in counseling and coordinating care.  Total face to face time:  25 min

## 2017-11-04 NOTE — Telephone Encounter (Signed)
Patient is calling stating that she wants a referral for her DYSPHONIA She is still having issues and DR BOBBITT said he would refer her out for the issues if needed

## 2017-11-04 NOTE — Telephone Encounter (Signed)
Dr. Verlin Fester will you please advise? Thank You

## 2017-11-07 ENCOUNTER — Ambulatory Visit (INDEPENDENT_AMBULATORY_CARE_PROVIDER_SITE_OTHER): Payer: Medicare HMO | Admitting: Family Medicine

## 2017-11-07 ENCOUNTER — Encounter: Payer: Self-pay | Admitting: Family Medicine

## 2017-11-07 VITALS — BP 120/84 | HR 108 | Temp 99.4°F | Resp 12 | Ht 60.5 in

## 2017-11-07 DIAGNOSIS — S61451A Open bite of right hand, initial encounter: Secondary | ICD-10-CM

## 2017-11-07 DIAGNOSIS — W5501XA Bitten by cat, initial encounter: Secondary | ICD-10-CM

## 2017-11-07 DIAGNOSIS — L03113 Cellulitis of right upper limb: Secondary | ICD-10-CM

## 2017-11-07 MED ORDER — AMOXICILLIN-POT CLAVULANATE 875-125 MG PO TABS: 1.0000 | ORAL_TABLET | Freq: Two times a day (BID) | ORAL | 0 refills | Status: AC

## 2017-11-07 NOTE — Progress Notes (Signed)
ACUTE VISIT   HPI:  Chief Complaint  Patient presents with  . Cat bite on left hand    happened Saturday, tender and red    [name] is a 62 y.o. female, who is here today complaining of cat bite that happened on 11/05/2017 at night. She was playing with her cat, she got upset and bit her left hand.  According to patient, her cat has vaccines up-to-date and has not been sick.  She washed area x3. She has noted worsening pain, erythema, and local heat. She has not taken OTC medication. Tdap is up-to-date.  History of OA, she has not noted worsening IP joint pain or joint edema/erythema.   Review of Systems  Constitutional: Negative for chills, fatigue and fever.  HENT: Negative for mouth sores and sore throat.   Musculoskeletal: Positive for arthralgias. Negative for joint swelling.  Skin: Positive for wound.  Neurological: Negative for weakness and numbness.  Psychiatric/Behavioral: Negative for confusion. The patient is nervous/anxious.       Current Outpatient Medications on File Prior to Visit  Medication Sig Dispense Refill  . albuterol (PROVENTIL HFA;VENTOLIN HFA) 108 (90 Base) MCG/ACT inhaler Inhale 1-2 puffs into the lungs daily as needed for wheezing or shortness of breath. 54 Inhaler 1  . albuterol (PROVENTIL) (2.5 MG/3ML) 0.083% nebulizer solution TAKE 3 MLS BY NEBULIZATION EVERY 6 HOURS AS NEEDED FOR WHEEZING OR SHORTNESS OF BREATH. 75 mL 1  . alendronate (FOSAMAX) 70 MG tablet TAKE 1 TABLET BY MOUTH EVERY 7 DAYS WITH FULL GLASS OF WATER ON EMPTY STOMACH 12 tablet 2  . amitriptyline (ELAVIL) 10 MG tablet TAKE 1 TABLET BY MOUTH EVERYDAY AT BEDTIME  1  . anti-nausea (EMETROL) solution Take 30 mLs by mouth every 15 (fifteen) minutes as needed for nausea or vomiting.    . Azelastine HCl 0.15 % SOLN Place 2 sprays into the nose daily as needed. 30 mL 3  . budesonide-formoterol (SYMBICORT) 80-4.5 MCG/ACT inhaler Inhale into the lungs.    .  Carbinoxamine Maleate 4 MG TABS Take 1 tablet (4 mg total) by mouth every 8 (eight) hours as needed. 28 each 1  . [facility] D3 5000 units capsule TAKE 2 TABLETS BY MOUTH EVERY DAY 60 capsule 0  . ELMIRON 100 MG capsule Take 100 mg by mouth 2 (two) times daily.   1  . estradiol (ESTRACE) 0.1 MG/GM vaginal cream Place 1 Applicatorful vaginally once a week. 42.5 g 2  . fluticasone (FLOVENT HFA) 220 MCG/ACT inhaler TAKE 2 PUFFS BY MOUTH TWICE A DAY 12 Inhaler 3  . gabapentin (NEURONTIN) 300 MG capsule Take 300 mg by mouth 3 (three) times daily.     . hydrocortisone cream 1 % Apply 1 application topically 2 (two) times daily as needed for itching.     . hydrOXYzine (ATARAX/VISTARIL) 25 MG tablet Take 25 mg by mouth 2 (two) times daily.  1  . ibuprofen (ADVIL,MOTRIN) 200 MG tablet Take 200-800 mg by mouth every 6 (six) hours as needed for headache or moderate pain. Depends on pain    . ipratropium (ATROVENT) 0.06 % nasal spray Place 2 sprays into both nostrils every 8 (eight) hours as needed for rhinitis. 15 mL 1  . levothyroxine (SYNTHROID, LEVOTHROID) 50 MCG tablet TAKE 1 TABLET EVERY DAY--- takes in am 90 tablet 1  . loperamide (IMODIUM A-D) 2 MG tablet Take 2 mg by mouth 4 (four) times daily as needed for diarrhea or loose stools.    Marland Kitchen  mometasone (NASONEX) 50 MCG/ACT nasal spray Place 2 sprays into the nose every morning.    . montelukast (SINGULAIR) 10 MG tablet Take 1 tablet (10 mg total) by mouth at bedtime. (Patient taking differently: Take 10 mg by mouth at bedtime. ) 30 tablet 10  . naproxen sodium (ANAPROX) 220 MG tablet Take 4 tablets (880 mg total) by mouth 2 (two) times daily as needed (headache). 60 tablet 5  . primidone (MYSOLINE) 50 MG tablet Take 1 tablet (50 mg total) by mouth 2 (two) times daily. 180 tablet 1  . rizatriptan (MAXALT-MLT) 10 MG disintegrating tablet TAKE 1 TABLET EVERY DAY AS NEEDED FOR MIGRAINE, MAY REPEAT IN 2 HRS IF NEEDED. MAX 2 DOSES/WK 9 tablet 3  . rosuvastatin  (CRESTOR) 20 MG tablet TAKE 1 TABLET BY MOUTH EVERY DAY 90 tablet 1  . sertraline (ZOLOFT) 100 MG tablet Take 2 tablets (200 mg total) by mouth daily. (Patient taking differently: Take 100 mg by mouth at bedtime. ) 14 tablet 0  . Spacer/Aero-Holding Chambers (AEROCHAMBER PLUS) inhaler Use as instructed 1 each 0  . Tiotropium Bromide Monohydrate (SPIRIVA RESPIMAT) 1.25 MCG/ACT AERS Inhale 1.25 mcg into the lungs daily. 4 g 5  . umeclidinium bromide (INCRUSE ELLIPTA) 62.5 MCG/INH AEPB Inhale 1 puff into the lungs daily. 30 each 4  . ziprasidone (GEODON) 40 MG capsule Take 40 mg by mouth 2 (two) times daily.  2  . Zoster Vaccine Adjuvanted [facility]) injection 0.5 ml in muscle and repeat in 8 weeks 0.5 mL 1   No current facility-administered medications on file prior to visit.      Past Medical History:  Diagnosis Date  . Allergic rhinitis   . Benign essential tremor    hands  . Bipolar 1 disorder, mixed, moderate ([place])   . Bladder pain   . Claustrophobia   . Depression   . Eczema   . History of frequent urinary tract infections   . History of gastroesophageal reflux (GERD)    05-25-2017  per pt no issues since hiatal hernia repair 01/ 2018  . History of hiatal hernia   . History of melanoma excision    early 2000s-- BACK  . History of panic attacks   . 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 Hypothyroidism   . Interstitial cystitis   . Limited jaw range of motion    s/p  bilateral TMJ surgery,  age 41s  . Lower urinary tract symptoms (LUTS)   . Migraines   . Mild asthma   . OA (osteoarthritis)   . OSA on CPAP    per last sleep study 09/ 2017  mild OSA, AHI13/hr  . PONV (postoperative nausea and vomiting)   . Recurrent upper respiratory infection (URI)   . Rosacea   . Sensation of pressure in bladder area   . TMJ arthralgia   . Urticaria   . Wears glasses   . White coat syndrome without hypertension    05-25-2017  per pt hx hypertension yrs ago, no  issues after quitting stressful job   Allergies  Allergen Reactions  . Indomethacin Other (See Comments)    Muscle spasms Causes muscle spasms in neck  . Amlodipine Swelling    Peripheral edema  . Pseudoephedrine Other (See Comments)    I fly off the walls  CANNOT SLEEP  . Risperidone And Related Other (See Comments)    Stomach upset, insomnia, drooling, tremors, "jerks," sensitivity to touch.   Marland Kitchen  Bupivacaine Other (See Comments)    Will have hives  . Hydrocodone-Acetaminophen Other (See Comments)  . Pentazocine Other (See Comments)    Unknown reaction MADE ME "LACTATE"  . Propoxyphene Itching and Nausea And Vomiting    DARVOCET  . Risperidone Other (See Comments)    UNKNOWN REACTION  . Sulfa Antibiotics Other (See Comments)    Unknown reaction  . Tramadol Other (See Comments)    Patient can not remember  . Aspirin Other (See Comments)    REACTION: upset stomach Cannot recall reaction.   . Bupivacaine Hcl Hives  . Codeine Itching and Other (See Comments)    Anything related to codeine  . Etodolac Rash and Other (See Comments)    UNKNOWN REACTION  . Ivp Dye [Iodinated Diagnostic Agents] Itching    Flushed and Fever, itch all over  . Pentazocine Lactate Itching    oozing  . Propranolol Nausea Only and Other (See Comments)  . Tramadol Hcl Other (See Comments)    unknown    Social History   Socioeconomic History  . Marital status: Divorced    Spouse name: Not on file  . Number of children: 0  . Years of education: Not on file  . Highest education level: Not on file  Occupational History  . Not on file  Social Needs  . Financial resource strain: Not on file  . Food insecurity:    Worry: Not on file    Inability: Not on file  . Transportation needs:    Medical: Not on file    Non-medical: Not on file  Tobacco Use  . Smoking status: Never Smoker  . Smokeless tobacco: Never Used  Substance and Sexual Activity  . Alcohol use: Yes    Alcohol/week: 0.0  standard drinks    Comment: once every 3-4 months  . Drug use: No  . Sexual activity: Not on file  Lifestyle  . Physical activity:    Days per week: Not on file    Minutes per session: Not on file  . Stress: Not on file  Relationships  . Social connections:    Talks on phone: Not on file    Gets together: Not on file    Attends religious service: Not on file    Active member of club or organization: Not on file    Attends meetings of clubs or organizations: Not on file    Relationship status: Not on file  Other Topics Concern  . Not on file  Social History Narrative  . Not on file    Vitals:   11/07/17 1455 11/07/17 1601  BP: 120/84   Pulse: (!) 119 (!) 108  Resp: 12   Temp: 99.4 F (37.4 C)   SpO2: 96%    Body mass index is 32.87 kg/m.   Physical Exam  Nursing note and vitals reviewed. Constitutional: She is oriented to person, place, and time. She appears well-developed. No distress.  HENT:  Head: Normocephalic and atraumatic.  Eyes: Conjunctivae are normal.  Cardiovascular: Regular rhythm. Tachycardia present.  No murmur heard. Pulses:      Radial pulses are 2+ on the left side.  Respiratory: Effort normal and breath sounds normal. No respiratory distress.  Musculoskeletal:       Left hand: She exhibits tenderness. She exhibits normal range of motion and normal capillary refill. Normal sensation noted. Normal strength noted.  Lymphadenopathy:    She has no cervical adenopathy.       Left: No epitrochlear adenopathy  present.  Neurological: She is alert and oriented to person, place, and time.  Skin: Skin is warm. Lesion noted. No rash noted. There is erythema.  2 punctuate wounds on dorsal,radial aspect of left hand. Wounds are surrounded by erythema and local heat + mild edema. Tenderness upon palpation.  See picture.  Psychiatric: She has a normal mood and affect.  Well groomed, good eye contact.        ASSESSMENT AND PLAN:   [name] was seen  today for cat bite on left hand.  Diagnoses and all orders for this visit:  Cat bite of right hand, initial encounter  Cellulitis of right upper extremity -     amoxicillin-clavulanate (AUGMENTIN) 875-125 MG tablet; Take 1 tablet by mouth 2 (two) times daily for 7 days.   Recommend keeping wound clean with soap and water. Extremity elevation. Side effects of antibiotics discussed. Tdap up-to-date. Instructed about warning signs. Follow-up as needed.    Return if symptoms worsen or fail to improve.      Braedon Sjogren G. Martinique, MD  [facility]. [place] office.

## 2017-11-07 NOTE — Patient Instructions (Signed)
A few things to remember from today's visit:   Cat bite of right hand, initial encounter  Cellulitis of right upper extremity - Plan: amoxicillin-clavulanate (AUGMENTIN) 875-125 MG tablet  Animal Bite Animal bite wounds can get infected. It is important to get proper medical treatment. Ask your doctor if you need rabies treatment. Follow these instructions at home: Wound care  Follow instructions from your doctor about how to take care of your wound. Make sure you: ? Wash your hands with soap and water before you change your bandage (dressing). If you cannot use soap and water, use hand sanitizer. ? Change your bandage as told by your doctor. ? Leave stitches (sutures), skin glue, or skin tape (adhesive) strips in place. They may need to stay in place for 2 weeks or longer. If tape strips get loose and curl up, you may trim the loose edges. Do not remove tape strips completely unless your doctor says it is okay.  Check your wound every day for signs of infection. Watch for: ? Redness, swelling, or pain that gets worse. ? Fluid, blood, or pus. General instructions  Take or apply over-the-counter and prescription medicines only as told by your doctor.  If you were prescribed an antibiotic, take or apply it as told by your doctor. Do not stop using the antibiotic even if your condition improves.  Keep the injured area raised (elevated) above the level of your heart while you are sitting or lying down.  If directed, apply ice to the injured area. ? Put ice in a plastic bag. ? Place a towel between your skin and the bag. ? Leave the ice on for 20 minutes, 2-3 times per day.  Keep all follow-up visits as told by your doctor. This is important. Contact a doctor if:  You have redness, swelling, or pain that gets worse.  You have a general feeling of sickness (malaise).  You feel sick to your stomach (nauseous).  You throw up (vomit).  You have pain that does not get better. Get  help right away if:  You have a red streak going away from your wound.  You have fluid, blood, or pus coming from your wound.  You have a fever or chills.  You have trouble moving your injured area.  You have numbness or tingling anywhere on your body. This information is not intended to replace advice given to you by your health care provider. Make sure you discuss any questions you have with your health care provider. Document Released: 01/25/2005 Document Revised: 07/03/2015 Document Reviewed: 06/12/2014 Elsevier Interactive Patient Education  2018 [employer].   Please be sure medication list is accurate. If a new problem present, please set up appointment sooner than planned today.

## 2017-11-07 NOTE — Telephone Encounter (Signed)
Referral has been placed in [facility] for Dr Velvet Bathe office.

## 2017-11-07 NOTE — Telephone Encounter (Signed)
Noted. Thanks.

## 2017-11-07 NOTE — Telephone Encounter (Signed)
Please refer to Dr. Benjamine Mola - dx: dysphonia.

## 2017-11-08 ENCOUNTER — Encounter: Payer: Self-pay | Admitting: Neurology

## 2017-11-08 ENCOUNTER — Ambulatory Visit: Payer: Self-pay | Admitting: *Deleted

## 2017-11-08 ENCOUNTER — Ambulatory Visit: Payer: Medicare HMO | Admitting: Neurology

## 2017-11-08 VITALS — BP 138/74 | HR 116 | Ht 60.5 in | Wt 166.0 lb

## 2017-11-08 DIAGNOSIS — R251 Tremor, unspecified: Secondary | ICD-10-CM | POA: Diagnosis not present

## 2017-11-08 DIAGNOSIS — G2111 Neuroleptic induced parkinsonism: Secondary | ICD-10-CM

## 2017-11-08 DIAGNOSIS — R000 Tachycardia, unspecified: Secondary | ICD-10-CM | POA: Diagnosis not present

## 2017-11-08 NOTE — Telephone Encounter (Signed)
Pt reports "heart beating fast today and yesterday." Seen by neurologist today who noted HR 116, sinus tach.  Pt saw Dr. Martinique yesterday, HR 106.  Pt was not able to palpate pulse during call, unable to determine rate. States "Feels like it did today." Denies any symptoms; no CP, dizziness, SOB, diaphoresis. States is staying hydrated, has not had any caffeine or alcohol.  Dr. Doristine Devoid noted she was sending note to Dr. Martinique re: HR due to pending knee surgery.  Care advise given to patient. Instructed to go to ED if symptoms if feels as if heart rate increases (Pt unable to check HR, can not palpate pulse)  Or if CP, SOB, dizziness occur. Pt assured NT would alert Dr. Martinique.  Reason for Disposition . [1] Skipped or extra beat(s) AND [2] occurs < 4 times / minute    No palpitations, HR 116 at OV today  Answer Assessment - Initial Assessment Questions 1. DESCRIPTION: "Please describe your heart rate or heart beat that you are having" (e.g., fast/slow, regular/irregular, skipped or extra beats, "palpitations")     116 2. ONSET: "When did it start?" (Minutes, hours or days)      yesterday 3. DURATION: "How long does it last" (e.g., seconds, minutes, hours)     Since yesterday 4. PATTERN "Does it come and go, or has it been constant since it started?"  "Does it get worse with exertion?"   "Are you feeling it now?"     Since this am 116 at neurologist today. 106 yesterday with Dr. Martinique 5. TAP: "Using your hand, can you tap out what you are feeling on a chair or table in front of you, so that I can hear?" (Note: not all patients can do this)       no 6. HEART RATE: "Can you tell me your heart rate?" "How many beats in 15 seconds?"  (Note: not all patients can do this)       unable 7. RECURRENT SYMPTOM: "Have you ever had this before?" If so, ask: "When was the last time?" and "What happened that time?"      no 8. CAUSE: "What do you think is causing the palpitations?"     No palpitations 9. CARDIAC  HISTORY: "Do you have any history of heart disease?" (e.g., heart attack, angina, bypass surgery, angioplasty, arrhythmia)       10. OTHER SYMPTOMS: "Do you have any other symptoms?" (e.g., dizziness, chest pain, sweating, difficulty breathing)      no  Protocols used: HEART RATE AND HEARTBEAT QUESTIONS-A-AH

## 2017-11-08 NOTE — Patient Instructions (Addendum)
1.  Continue primidone 50 mg - 1 tablet twice per day

## 2017-11-09 DIAGNOSIS — F3132 Bipolar disorder, current episode depressed, moderate: Secondary | ICD-10-CM | POA: Diagnosis not present

## 2017-11-09 DIAGNOSIS — R69 Illness, unspecified: Secondary | ICD-10-CM | POA: Diagnosis not present

## 2017-11-09 NOTE — Telephone Encounter (Signed)
Message sent to Dr. Jordan for review. 

## 2017-11-09 NOTE — Telephone Encounter (Signed)
Care advise was given to the pt by nursing.  I reviewed the note from Dr. Doristine Devoid office.  Looks like pt has a history of anxiety and elevated HR.  If needed, I can schedule the pt to see Dr. Martinique.  Please send for her review.

## 2017-11-09 NOTE — Telephone Encounter (Signed)
Please advised to monitor heart rate and BP daily at home. Please arrange appointment for next week, before if she is experiencing chest pain, dyspnea, or palpitations. Thanks, BJ

## 2017-11-09 NOTE — Telephone Encounter (Signed)
Spoke with patient and she states that she isn't having chest pain, sometimes she forget to breath so she takes deep breaths. Patient scheduled appointment for Monday, 11/14/17 at 2:30 pm. Patient advised to come in before if she does get these symptoms, patient verbalized understanding.

## 2017-11-10 DIAGNOSIS — R69 Illness, unspecified: Secondary | ICD-10-CM | POA: Diagnosis not present

## 2017-11-14 ENCOUNTER — Ambulatory Visit: Payer: Self-pay | Admitting: Family Medicine

## 2017-11-16 DIAGNOSIS — F3132 Bipolar disorder, current episode depressed, moderate: Secondary | ICD-10-CM | POA: Diagnosis not present

## 2017-11-16 DIAGNOSIS — R69 Illness, unspecified: Secondary | ICD-10-CM | POA: Diagnosis not present

## 2017-11-17 NOTE — Telephone Encounter (Signed)
Patient is scheduled with Dr Benjamine Mola on 12/20/2017 @ 1:00

## 2017-11-18 ENCOUNTER — Ambulatory Visit: Payer: Self-pay | Admitting: Family Medicine

## 2017-11-18 DIAGNOSIS — Z0289 Encounter for other administrative examinations: Secondary | ICD-10-CM

## 2017-11-21 ENCOUNTER — Other Ambulatory Visit: Payer: Medicare HMO

## 2017-11-21 ENCOUNTER — Ambulatory Visit (INDEPENDENT_AMBULATORY_CARE_PROVIDER_SITE_OTHER): Payer: Medicare HMO | Admitting: Family Medicine

## 2017-11-21 ENCOUNTER — Encounter: Payer: Self-pay | Admitting: Family Medicine

## 2017-11-21 ENCOUNTER — Ambulatory Visit: Payer: Self-pay

## 2017-11-21 VITALS — BP 140/88 | HR 95 | Temp 98.4°F | Resp 12 | Ht 60.5 in | Wt 165.4 lb

## 2017-11-21 DIAGNOSIS — I493 Ventricular premature depolarization: Secondary | ICD-10-CM | POA: Insufficient documentation

## 2017-11-21 DIAGNOSIS — R000 Tachycardia, unspecified: Secondary | ICD-10-CM

## 2017-11-21 DIAGNOSIS — I10 Essential (primary) hypertension: Secondary | ICD-10-CM

## 2017-11-21 DIAGNOSIS — Z01818 Encounter for other preprocedural examination: Secondary | ICD-10-CM

## 2017-11-21 LAB — URINALYSIS, ROUTINE W REFLEX MICROSCOPIC
BILIRUBIN URINE: NEGATIVE
HGB URINE DIPSTICK: NEGATIVE
Ketones, ur: NEGATIVE
Leukocytes, UA: NEGATIVE
Nitrite: NEGATIVE
Specific Gravity, Urine: <=1.005 — AB (ref 1.000–1.030)
Total Protein, Urine: NEGATIVE
URINE GLUCOSE: NEGATIVE
Urobilinogen, UA: 0.2 (ref 0.0–1.0)
pH: 6.5 (ref 5.0–8.0)

## 2017-11-21 NOTE — Patient Instructions (Addendum)
A few things to remember from today's visit:   Paroxysmal supraventricular tachycardia ([name])  PVC (premature ventricular contraction) - Plan: EKG 12-Lead, Ambulatory referral to Cardiology  Pre-op evaluation - Plan: Ambulatory referral to Cardiology    Please be sure medication list is accurate. If a new problem present, please set up appointment sooner than planned today.

## 2017-11-21 NOTE — Progress Notes (Signed)
HPI:   [name] is a 62 y.o. female, who is here today to follow on tachycardia. I last saw her on 11/07/17, noted to have mildly elevated HR,108/min. She attributed it to be "nerveous", asymptomatic.  She recently followed with her neurologist,Dr Tat, who was concerned about persistent tachycardia,HR in her office was 116.  She is trying to pay more attention and has noted like her "heart is going fast sometimes." She is not sure about duration,exacerbating or alleviating factors,it happens at rest. She mentions that for the past few months she has had times when she feel hot and sweating for a few seconds.   She was recently on the beach for a few days and her friend checked her BP a few times, HR was between 80's and max 96/min.   Denies chest pain, dyspnea, claudication, focal weakness,associated dizziness, or edema. She is not taking OTC herbal products or cold meds.  She is trying to eat healthier, not taking wt loss meds.  Hx of anxiety and bipolar disorder, she has not started new medications. She is trying to drink enough fluids.  Negative for illicit drug use.  Hypothyroidism on Levothyroxine 50 mcg daily, she has been on same dose for years.  Lab Results  Component Value Date   TSH 0.46 10/24/2017   Hx of HTN, she is on non pharmacologic treatment. Following low salt diet.  Planning on having left TKR 12/16/17.   Review of Systems  Constitutional: Positive for fatigue. Negative for activity change, appetite change and fever.  HENT: Negative for mouth sores, nosebleeds and trouble swallowing.   Eyes: Negative for redness and visual disturbance.  Respiratory: Negative for cough, shortness of breath and wheezing.   Cardiovascular: Positive for palpitations. Negative for chest pain and leg swelling.  Gastrointestinal: Negative for abdominal pain, nausea and vomiting.       Negative for changes in bowel habits.  Endocrine: Positive for heat  intolerance. Negative for cold intolerance.  Genitourinary: Negative for decreased urine volume, dysuria and hematuria.  Musculoskeletal: Positive for arthralgias and gait problem.  Allergic/Immunologic: Positive for environmental allergies.  Neurological: Negative for syncope, weakness and headaches.  Psychiatric/Behavioral: Negative for confusion. The patient is nervous/anxious.       Current Outpatient Medications on File Prior to Visit  Medication Sig Dispense Refill  . albuterol (PROVENTIL HFA;VENTOLIN HFA) 108 (90 Base) MCG/ACT inhaler Inhale 1-2 puffs into the lungs daily as needed for wheezing or shortness of breath. 54 Inhaler 1  . albuterol (PROVENTIL) (2.5 MG/3ML) 0.083% nebulizer solution TAKE 3 MLS BY NEBULIZATION EVERY 6 HOURS AS NEEDED FOR WHEEZING OR SHORTNESS OF BREATH. 75 mL 1  . alendronate (FOSAMAX) 70 MG tablet TAKE 1 TABLET BY MOUTH EVERY 7 DAYS WITH FULL GLASS OF WATER ON EMPTY STOMACH 12 tablet 2  . amitriptyline (ELAVIL) 10 MG tablet TAKE 1 TABLET BY MOUTH EVERYDAY AT BEDTIME  1  . anti-nausea (EMETROL) solution Take 30 mLs by mouth every 15 (fifteen) minutes as needed for nausea or vomiting.    . Azelastine HCl 0.15 % SOLN Place 2 sprays into the nose daily as needed. 30 mL 3  . Carbinoxamine Maleate 4 MG TABS Take 1 tablet (4 mg total) by mouth every 8 (eight) hours as needed. 28 each 1  . [employer] D3 5000 units capsule TAKE 2 TABLETS BY MOUTH EVERY DAY 60 capsule 0  . ELMIRON 100 MG capsule Take 100 mg by mouth 2 (two) times daily.  1  . estradiol (ESTRACE) 0.1 MG/GM vaginal cream Place 1 Applicatorful vaginally once a week. 42.5 g 2  . fluticasone (FLOVENT HFA) 220 MCG/ACT inhaler TAKE 2 PUFFS BY MOUTH TWICE A DAY 12 Inhaler 3  . gabapentin (NEURONTIN) 300 MG capsule Take 300 mg by mouth 3 (three) times daily.     . hydrocortisone cream 1 % Apply 1 application topically 2 (two) times daily as needed for itching.     . hydrOXYzine (ATARAX/VISTARIL) 25 MG tablet Take  25 mg by mouth 2 (two) times daily.  1  . ipratropium (ATROVENT) 0.06 % nasal spray Place 2 sprays into both nostrils every 8 (eight) hours as needed for rhinitis. 15 mL 1  . levothyroxine (SYNTHROID, LEVOTHROID) 50 MCG tablet TAKE 1 TABLET EVERY DAY--- takes in am 90 tablet 1  . loperamide (IMODIUM A-D) 2 MG tablet Take 2 mg by mouth 4 (four) times daily as needed for diarrhea or loose stools.    . mometasone (NASONEX) 50 MCG/ACT nasal spray Place 2 sprays into the nose every morning.    . montelukast (SINGULAIR) 10 MG tablet Take 1 tablet (10 mg total) by mouth at bedtime. (Patient taking differently: Take 10 mg by mouth at bedtime. ) 30 tablet 10  . primidone (MYSOLINE) 50 MG tablet Take 1 tablet (50 mg total) by mouth 2 (two) times daily. 180 tablet 1  . rizatriptan (MAXALT-MLT) 10 MG disintegrating tablet TAKE 1 TABLET EVERY DAY AS NEEDED FOR MIGRAINE, MAY REPEAT IN 2 HRS IF NEEDED. MAX 2 DOSES/WK 9 tablet 3  . rosuvastatin (CRESTOR) 20 MG tablet TAKE 1 TABLET BY MOUTH EVERY DAY 90 tablet 1  . sertraline (ZOLOFT) 100 MG tablet Take 2 tablets (200 mg total) by mouth daily. (Patient taking differently: Take 100 mg by mouth at bedtime. ) 14 tablet 0  . Tiotropium Bromide Monohydrate (SPIRIVA RESPIMAT) 1.25 MCG/ACT AERS Inhale 1.25 mcg into the lungs daily. 4 g 5  . umeclidinium bromide (INCRUSE ELLIPTA) 62.5 MCG/INH AEPB Inhale 1 puff into the lungs daily. 30 each 4  . ziprasidone (GEODON) 40 MG capsule Take 40 mg by mouth 2 (two) times daily.  2  . Zoster Vaccine Adjuvanted [facility]) injection 0.5 ml in muscle and repeat in 8 weeks 0.5 mL 1   No current facility-administered medications on file prior to visit.      Past Medical History:  Diagnosis Date  . Allergic rhinitis   . Benign essential tremor    hands  . Bipolar 1 disorder, mixed, moderate ([place])   . Bladder pain   . Claustrophobia   . Depression   . Eczema   . History of frequent urinary tract infections   . History of  gastroesophageal reflux (GERD)    05-25-2017  per pt no issues since hiatal hernia repair 01/ 2018  . History of hiatal hernia   . History of melanoma excision    early 2000s-- BACK  . History of panic attacks   . 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 Hypothyroidism   . Interstitial cystitis   . Limited jaw range of motion    s/p  bilateral TMJ surgery,  age 16s  . Lower urinary tract symptoms (LUTS)   . Migraines   . Mild asthma   . OA (osteoarthritis)   . OSA on CPAP    per last sleep study 09/ 2017  mild OSA, AHI13/hr  . PONV (postoperative nausea and vomiting)   .  Recurrent upper respiratory infection (URI)   . Rosacea   . Sensation of pressure in bladder area   . TMJ arthralgia   . Urticaria   . Wears glasses   . White coat syndrome without hypertension    05-25-2017  per pt hx hypertension yrs ago, no issues after quitting stressful job   Allergies  Allergen Reactions  . Indomethacin Other (See Comments)    Muscle spasms Causes muscle spasms in neck  . Amlodipine Swelling    Peripheral edema  . Pseudoephedrine Other (See Comments)    I fly off the walls  CANNOT SLEEP  . Risperidone And Related Other (See Comments)    Stomach upset, insomnia, drooling, tremors, "jerks," sensitivity to touch.   . Bupivacaine Other (See Comments)    Will have hives  . Hydrocodone-Acetaminophen Other (See Comments)  . Pentazocine Other (See Comments)    Unknown reaction MADE ME "LACTATE"  . Propoxyphene Itching and Nausea And Vomiting    DARVOCET  . Risperidone Other (See Comments)    UNKNOWN REACTION  . Sulfa Antibiotics Other (See Comments)    Unknown reaction  . Tramadol Other (See Comments)    Patient can not remember  . Aspirin Other (See Comments)    REACTION: upset stomach Cannot recall reaction.   . Bupivacaine Hcl Hives  . Codeine Itching and Other (See Comments)    Anything related to codeine  . Etodolac Rash and Other (See Comments)     UNKNOWN REACTION  . Ivp Dye [Iodinated Diagnostic Agents] Itching    Flushed and Fever, itch all over  . Pentazocine Lactate Itching    oozing  . Propranolol Nausea Only and Other (See Comments)  . Tramadol Hcl Other (See Comments)    unknown    Social History   Socioeconomic History  . Marital status: Divorced    Spouse name: Not on file  . Number of children: 0  . Years of education: Not on file  . Highest education level: Not on file  Occupational History  . Not on file  Social Needs  . Financial resource strain: Not on file  . Food insecurity:    Worry: Not on file    Inability: Not on file  . Transportation needs:    Medical: Not on file    Non-medical: Not on file  Tobacco Use  . Smoking status: Never Smoker  . Smokeless tobacco: Never Used  Substance and Sexual Activity  . Alcohol use: Yes    Alcohol/week: 0.0 standard drinks    Comment: once every 3-4 months  . Drug use: No  . Sexual activity: Not on file  Lifestyle  . Physical activity:    Days per week: Not on file    Minutes per session: Not on file  . Stress: Not on file  Relationships  . Social connections:    Talks on phone: Not on file    Gets together: Not on file    Attends religious service: Not on file    Active member of club or organization: Not on file    Attends meetings of clubs or organizations: Not on file    Relationship status: Not on file  Other Topics Concern  . Not on file  Social History Narrative  . Not on file    Vitals:   11/21/17 0930  BP: 140/88  Pulse: 95  Resp: 12  Temp: 98.4 F (36.9 C)  SpO2: 95%   Body mass index is 31.77 kg/m.  Physical Exam  Nursing note and vitals reviewed. Constitutional: She is oriented to person, place, and time. She appears well-developed. No distress.  HENT:  Head: Normocephalic and atraumatic.  Mouth/Throat: Oropharynx is clear and moist. Mucous membranes are dry.  Eyes: Conjunctivae are normal.  Cardiovascular: Normal rate  and regular rhythm.  No murmur heard. Pulses:      Dorsalis pedis pulses are 2+ on the right side, and 2+ on the left side.  Respiratory: Effort normal and breath sounds normal. No respiratory distress.  Musculoskeletal: She exhibits no edema.  Lymphadenopathy:    She has no cervical adenopathy.  Neurological: She is alert and oriented to person, place, and time. She has normal strength. No cranial nerve deficit.  Antalgic gait assisted with a cane today.  Skin: Skin is warm and dry. No rash noted. No erythema.  Psychiatric: Her mood appears anxious.  Well groomed, good eye contact.    ASSESSMENT AND PLAN:  Ms. Amire was seen today for follow-up.  Orders Placed This Encounter  Procedures  . Ambulatory referral to Cardiology  . EKG 12-Lead    Tachycardia Mild and otherwise asymptomatic. We discussed possible causes: Anxiety,medications,dehydration among some. BMP 10/2017 normal.  Lab Results  Component Value Date   CREATININE 1.14 10/24/2017   BUN 10 10/24/2017   NA 139 10/24/2017   K 4.6 10/24/2017   CL 100 10/24/2017   CO2 31 10/24/2017   Essential hypertension BP mildly elevated. Continue monitoring BP at home. Low salt diet. Keep next f/u appt.  PVC (premature ventricular contraction) EKG today SR with occasional PVC's. Compared with EKG's done in 02/2016 and 12/2016 PVC's are new. Because planning on having knee surgery in 12/2017 I recommend cardiac clearance with cardiologist. Instructed about warning signs.  -     EKG 12-Lead -     Ambulatory referral to Cardiology  Pre-op evaluation -     Ambulatory referral to Cardiology -     Urinalysis, Routine w reflex microscopic     Gilma Bessette G. Martinique, MD  [facility]. [place] office.

## 2017-11-22 DIAGNOSIS — R69 Illness, unspecified: Secondary | ICD-10-CM | POA: Diagnosis not present

## 2017-11-24 ENCOUNTER — Encounter: Payer: Self-pay | Admitting: Family Medicine

## 2017-11-28 ENCOUNTER — Ambulatory Visit: Payer: Medicare HMO | Admitting: Cardiology

## 2017-11-28 ENCOUNTER — Encounter: Payer: Self-pay | Admitting: Cardiology

## 2017-11-28 VITALS — BP 153/87 | HR 87 | Ht 60.0 in | Wt 160.4 lb

## 2017-11-28 DIAGNOSIS — R000 Tachycardia, unspecified: Secondary | ICD-10-CM

## 2017-11-28 DIAGNOSIS — I493 Ventricular premature depolarization: Secondary | ICD-10-CM | POA: Diagnosis not present

## 2017-11-28 NOTE — Patient Instructions (Signed)
Medication Instructions:  Continue same meds If you need a refill on your cardiac medications before your next appointment, please call your pharmacy.   Lab work: None ordered   Testing/Procedures: Schedule Echocardiogram  Follow-Up: At [facility], you and your health needs are our priority.  As part of our continuing mission to provide you with exceptional heart care, we have created designated Provider Care Teams.  These Care Teams include your primary Cardiologist (physician) and Advanced Practice Providers (APPs -  Physician Assistants and Nurse Practitioners) who all work together to provide you with the care you need, when you need it.  Follow Up will be determined after Echo

## 2017-11-28 NOTE — Progress Notes (Signed)
Cardiology Office Note   Date:  11/28/2017   ID:  [name], DOB 1955/05/12, MRN [id number]  PCP:  Whitney, Diane G, MD  Cardiologist:   Peter Martinique, MD   Chief Complaint  Patient presents with  . Palpitations      History of Present Illness: [name] is a 62 y.o. female who is seen at the request of Dr [name] for evaluation of PVCs and tachycardia. She also needs clearance for knee surgery. She has a history of bipolar disorder and OSA.   She reports that on 2 recent doctor's visits she was noted to have an elevated HR around 116 bpm. She was completely asymptomatic. Notes that "once in a blue moon" she feels her heart skip a beat. No chest pain, dizziness, or syncope. Notes since she had hiatal hernia repair she has some difficulty breathing deeply.     Past Medical History:  Diagnosis Date  . Allergic rhinitis   . Benign essential tremor    hands  . Bipolar 1 disorder, mixed, moderate ([place])   . Bladder pain   . Claustrophobia   . Depression   . Eczema   . History of frequent urinary tract infections   . History of gastroesophageal reflux (GERD)    05-25-2017  per pt no issues since hiatal hernia repair 01/ 2018  . History of hiatal hernia   . History of melanoma excision    early 2000s-- BACK  . History of panic attacks   . 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 Hypothyroidism   . Interstitial cystitis   . Limited jaw range of motion    s/p  bilateral TMJ surgery,  age 36s  . Lower urinary tract symptoms (LUTS)   . Migraines   . Mild asthma   . OA (osteoarthritis)   . OSA on CPAP    per last sleep study 09/ 2017  mild OSA, AHI13/hr  . PONV (postoperative nausea and vomiting)   . Recurrent upper respiratory infection (URI)   . Rosacea   . Sensation of pressure in bladder area   . TMJ arthralgia   . Urticaria   . Wears glasses   . White coat syndrome without hypertension    05-25-2017  per pt hx hypertension  yrs ago, no issues after quitting stressful job    Past Surgical History:  Procedure Laterality Date  . [place] STUDY N/A 09/22/2015   Procedure: [place] STUDY;  Surgeon: Doran Stabler, MD;  Location: [facility];  Service: Gastroenterology;  Laterality: N/A;  . ADENOIDECTOMY    . ANAL FISSURE REPAIR  age 32  (21)  . BREAST EXCISIONAL BIOPSY Right 04-16-2003  dr p. young  [place]   benign  . BUNIONECTOMY Right early 2000s  . CARPAL TUNNEL RELEASE Left age 71 (41)  . CHOLECYSTECTOMY OPEN  age 70  . CYSTO WITH HYDRODISTENSION N/A 06/02/2017   Procedure: CYSTOSCOPY/HYDRODISTENSION OF BLADDER;  Surgeon: Irine Seal, MD;  [facility];  Service: Urology;  Laterality: N/A;  . CYSTO/ HYDRODISTENTION/  INSTILLATION THERAPY  1990s  . DX LAPAROSCOPY/  DX HYSTEROSCOPY/  D & C  08-07-2007   dr dove  [facility]  . ENDOMETRIAL ABLATION W/ NOVASURE  10-21-2008   dr Harolyn Rutherford  [facility]  . ESOPHAGEAL MANOMETRY N/A 09/22/2015   Procedure: ESOPHAGEAL MANOMETRY (EM);  Surgeon: Doran Stabler, MD;  Location: [facility];  Service:  Gastroenterology;  Laterality: N/A;  with impedence   . FINGER ARTHROPLASTY Bilateral    left little finger 2016:  right middle finger 06/ 2018  . KNEE ARTHROSCOPY Bilateral x3 left ;  x3  right-- last one age 56 (57)  . NASAL SEPTUM SURGERY  age 87s  . ROBOT ASSISTED REDUCTION PARAESOPHAGEAL HIATAL HERNIA/ TYPE 2 MEDIASTINAL DISSECTION/ PRIMARY HIATAL HERNIA REPAIR/ ANTERIOR & POSTERIOR GASTROPEXY/ NISSEN FUNDOPLICATION  57-26-2035   DR GROSS  [facility]  . SINOSCOPY    . TEMPOROMANDIBULAR JOINT SURGERY Bilateral x3  -- age 44s   per pt used graft  . TONSILLECTOMY    . TRIGGER FINGER RELEASE Bilateral last one 2017   several release's bilaterally  . ULNAR NERVE TRANSPOSITION Bilateral age 65 (1980)     Current Outpatient Medications  Medication Sig Dispense Refill  . albuterol (PROVENTIL HFA;VENTOLIN HFA) 108 (90 Base) MCG/ACT inhaler Inhale 1-2 puffs  into the lungs daily as needed for wheezing or shortness of breath. 54 Inhaler 1  . albuterol (PROVENTIL) (2.5 MG/3ML) 0.083% nebulizer solution TAKE 3 MLS BY NEBULIZATION EVERY 6 HOURS AS NEEDED FOR WHEEZING OR SHORTNESS OF BREATH. 75 mL 1  . alendronate (FOSAMAX) 70 MG tablet TAKE 1 TABLET BY MOUTH EVERY 7 DAYS WITH FULL GLASS OF WATER ON EMPTY STOMACH 12 tablet 2  . amitriptyline (ELAVIL) 10 MG tablet TAKE 1 TABLET BY MOUTH EVERYDAY AT BEDTIME  1  . anti-nausea (EMETROL) solution Take 30 mLs by mouth every 15 (fifteen) minutes as needed for nausea or vomiting.    . Azelastine HCl 0.15 % SOLN Place 2 sprays into the nose daily as needed. 30 mL 3  . Carbinoxamine Maleate 4 MG TABS Take 1 tablet (4 mg total) by mouth every 8 (eight) hours as needed. 28 each 1  . Cholecalciferol (D3 VITAMIN PO) Take 5,000 Units by mouth daily.    Marland Kitchen ELMIRON 100 MG capsule Take 100 mg by mouth 2 (two) times daily.   1  . estradiol (ESTRACE) 0.1 MG/GM vaginal cream Place 1 Applicatorful vaginally once a week. 42.5 Whitney 2  . fluticasone (FLOVENT HFA) 220 MCG/ACT inhaler TAKE 2 PUFFS BY MOUTH TWICE A DAY 12 Inhaler 3  . gabapentin (NEURONTIN) 300 MG capsule Take 300 mg by mouth 3 (three) times daily.     . hydrocortisone cream 1 % Apply 1 application topically 2 (two) times daily as needed for itching.     . hydrOXYzine (ATARAX/VISTARIL) 25 MG tablet Take 25 mg by mouth 2 (two) times daily.  1  . ipratropium (ATROVENT) 0.06 % nasal spray Place 2 sprays into both nostrils every 8 (eight) hours as needed for rhinitis. 15 mL 1  . levothyroxine (SYNTHROID, LEVOTHROID) 50 MCG tablet TAKE 1 TABLET EVERY DAY--- takes in am 90 tablet 1  . loperamide (IMODIUM A-D) 2 MG tablet Take 2 mg by mouth 4 (four) times daily as needed for diarrhea or loose stools.    . mometasone (NASONEX) 50 MCG/ACT nasal spray Place 2 sprays into the nose every morning.    . montelukast (SINGULAIR) 10 MG tablet Take 1 tablet (10 mg total) by mouth at  bedtime. (Patient taking differently: Take 10 mg by mouth at bedtime. ) 30 tablet 10  . primidone (MYSOLINE) 50 MG tablet Take 1 tablet (50 mg total) by mouth 2 (two) times daily. 180 tablet 1  . rizatriptan (MAXALT-MLT) 10 MG disintegrating tablet TAKE 1 TABLET EVERY DAY AS NEEDED FOR MIGRAINE, MAY REPEAT IN 2 HRS  IF NEEDED. MAX 2 DOSES/WK 9 tablet 3  . rosuvastatin (CRESTOR) 20 MG tablet TAKE 1 TABLET BY MOUTH EVERY DAY 90 tablet 1  . sertraline (ZOLOFT) 100 MG tablet Take 100 mg by mouth daily.    Marland Kitchen umeclidinium bromide (INCRUSE ELLIPTA) 62.5 MCG/INH AEPB Inhale 1 puff into the lungs daily. 30 each 4  . ziprasidone (GEODON) 40 MG capsule Take 40 mg by mouth 2 (two) times daily.  2  . Zoster Vaccine Adjuvanted [facility]) injection 0.5 ml in muscle and repeat in 8 weeks 0.5 mL 1   No current facility-administered medications for this visit.     Allergies:   Indomethacin; Amlodipine; Pseudoephedrine; Risperidone and related; Bupivacaine; Hydrocodone-acetaminophen; Pentazocine; Propoxyphene; Risperidone; Sulfa antibiotics; Tramadol; Aspirin; Bupivacaine hcl; Codeine; Etodolac; Ivp dye [iodinated diagnostic agents]; Pentazocine lactate; Propranolol; and Tramadol hcl    Social History:  The patient  reports that she has never smoked. She has never used smokeless tobacco. She reports that she drinks alcohol. She reports that she does not use drugs.   Family History:  The patient's family history includes Allergic rhinitis in her father and mother; Colon polyps in her sister; Coronary artery disease in her unknown relative; Hypertension in her mother; Testicular cancer in her father; Thyroid disease in her mother.    ROS:  Please see the history of present illness.   Otherwise, review of systems are positive for none.   All other systems are reviewed and negative.    PHYSICAL EXAM: VS:  BP (!) 153/87 (BP Location: Right Arm)   Pulse 87   Ht 5' (1.524 m)   Wt 160 lb 6.4 oz (72.8 kg)   BMI 31.33  kg/m  , BMI Body mass index is 31.33 kg/m. GEN: Well nourished, well developed, in no acute distress  HEENT: normal  Neck: no JVD, carotid bruits, or masses Cardiac: RRR; no murmurs, rubs, or gallops,no edema  Respiratory:  clear to auscultation bilaterally, normal work of breathing GI: soft, nontender, nondistended, + BS MS: no deformity or atrophy  Skin: warm and dry, no rash Neuro:  Strength and sensation are intact Psych: euthymic mood, full affect   EKG:  EKG is not ordered today. The ekg ordered 11/21/17 demonstrates NSR with occ. PVCs. Rate 86. Otherwise normal. I have personally reviewed and interpreted this study.    Recent Labs: 10/24/2017: ALT 22; BUN 10; Creatinine, Ser 1.14; Hemoglobin 13.2; Platelets 253.0; Potassium 4.6; Sodium 139; TSH 0.46    Lipid Panel    Component Value Date/Time   CHOL 163 07/01/2017 1250   TRIG 88.0 07/01/2017 1250   HDL 75.20 07/01/2017 1250   CHOLHDL 2 07/01/2017 1250   VLDL 17.6 07/01/2017 1250   LDLCALC 70 07/01/2017 1250   LDLDIRECT 143.0 09/14/2016 1037      Wt Readings from Last 3 Encounters:  11/28/17 160 lb 6.4 oz (72.8 kg)  11/21/17 165 lb 6 oz (75 kg)  11/08/17 166 lb (75.3 kg)      Other studies Reviewed: Additional studies/ records that were reviewed today include: none   ASSESSMENT AND PLAN:  1.  PVCs. Minimally symptomatic. Normal cardiac exam and Ecg otherwise normal. Electrolytes and TSH normal. Will check Echo. If normal then I would not be concerned about her PVCs  2. Sinus tachycardia. Noted on 2 occasions without symptoms. HR now normal. Some of her prn medication can cause this particularly carbinoxamine but no need to change since HR was only mildly elevated and she had no symptoms  3. Pre op knee surgery. She is safe to proceed from a cardiac standpoint.    Current medicines are reviewed at length with the patient today.  The patient does not have concerns regarding medicines.  The following  changes have been made:  no change  Labs/ tests ordered today include:   Orders Placed This Encounter  Procedures  . ECHOCARDIOGRAM COMPLETE     Disposition:   FU with me PRN if Echo ok.  Signed, Peter Martinique, MD  11/28/2017 10:10 AM    [facility] [address], [place], [place], [address] Phone [phone], Fax [phone]

## 2017-11-29 NOTE — H&P (Signed)
TOTAL KNEE ADMISSION H&P  Patient is being admitted for left total knee arthroplasty.  Subjective:  Chief Complaint:left knee pain.  HPI: [name], 62 y.o. female, has a history of pain and functional disability in the left knee due to arthritis and has failed non-surgical conservative treatments for greater than 12 weeks to includecorticosteriod injections, viscosupplementation injections, supervised PT with diminished ADL's post treatment and activity modification.  Onset of symptoms was gradual, starting >10 years ago with gradually worsening course since that time. The patient noted prior procedures on the knee to include  arthroscopy on the left knee(s).  Patient currently rates pain in the left knee(s) at 8 out of 10 with activity. Patient has worsening of pain with activity and weight bearing, pain that interferes with activities of daily living and crepitus.  Patient has evidence of tri-compartmental bone-on-bone arthritis on the left knee with large osteophyte formation by imaging studies. There is no active infection.  Patient Active Problem List   Diagnosis Date Noted  . PVC (premature ventricular contraction) 11/21/2017  . Dysphonia 09/26/2017  . Allergic conjunctivitis 08/01/2017  . Abnormal liver function test 04/04/2017  . Vitamin D deficiency, unspecified 10/13/2016  . Hyperlipidemia 09/07/2016  . Osteoporosis without current pathological fracture 03/22/2016  . 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 Bipolar 1 disorder, mixed, moderate ([place]) 01/29/2016  . Class 1 obesity with body mass index (BMI) of 32.0 to 32.9 in adult 01/06/2016  . History of skin cancer 03/10/2015  . Bilateral knee pain 12/06/2014  . Migraines 11/21/2014  . Increased frequency of urination 05/30/2013  . Tremor 02/08/2012  . OTH D/O MENSTRUATION&OTH ABN BLEED FE GNT TRACT 12/01/2007  . PELVIC PAIN, HX OF 12/01/2007  . Dyspareunia, female  03/24/2007  . OSA on CPAP 01/30/2007  . GERD 11/02/2006  . DERMATITIS, HANDS 11/02/2006  . Cough, persistent 11/02/2006  . Hypothyroidism 10/07/2006  . Essential hypertension 10/07/2006  . RHINITIS, CHRONIC 10/07/2006  . Perennial allergic rhinitis 10/07/2006  . Moderate persistent asthma 10/07/2006  . Irritable bowel syndrome 10/07/2006  . INTERSTITIAL CYSTITIS 10/07/2006  . ROSACEA 10/07/2006  . Osteoarthritis of both knees 10/07/2006  . Disturbance in sleep behavior 10/07/2006  . Personal history of other malignant neoplasm of skin 10/07/2006   Past Medical History:  Diagnosis Date  . Allergic rhinitis   . Benign essential tremor    hands  . Bipolar 1 disorder, mixed, moderate ([place])   . Bladder pain   . Claustrophobia   . Depression   . Eczema   . History of frequent urinary tract infections   . History of gastroesophageal reflux (GERD)    05-25-2017  per pt no issues since hiatal hernia repair 01/ 2018  . History of hiatal hernia   . History of melanoma excision    early 2000s-- BACK  . History of panic attacks   . 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 Hypothyroidism   . Interstitial cystitis   . Limited jaw range of motion    s/p  bilateral TMJ surgery,  age 27s  . Lower urinary tract symptoms (LUTS)   . Migraines   . Mild asthma   . OA (osteoarthritis)   . OSA on CPAP    per last sleep study 09/ 2017  mild OSA, AHI13/hr  . PONV (postoperative nausea and vomiting)   . Recurrent upper respiratory infection (URI)   . Rosacea   . Sensation of  pressure in bladder area   . TMJ arthralgia   . Urticaria   . Wears glasses   . White coat syndrome without hypertension    05-25-2017  per pt hx hypertension yrs ago, no issues after quitting stressful job    Past Surgical History:  Procedure Laterality Date  . [place] STUDY N/A 09/22/2015   Procedure: [place] STUDY;  Surgeon: Doran Stabler, MD;  Location: [facility];  Service:  Gastroenterology;  Laterality: N/A;  . ADENOIDECTOMY    . ANAL FISSURE REPAIR  age 38  (61)  . BREAST EXCISIONAL BIOPSY Right 04-16-2003  dr p. young  [place]   benign  . BUNIONECTOMY Right early 2000s  . CARPAL TUNNEL RELEASE Left age 59 (86)  . CHOLECYSTECTOMY OPEN  age 70  . CYSTO WITH HYDRODISTENSION N/A 06/02/2017   Procedure: CYSTOSCOPY/HYDRODISTENSION OF BLADDER;  Surgeon: Irine Seal, MD;  Location: [facility];  Service: Urology;  Laterality: N/A;  . CYSTO/ HYDRODISTENTION/  INSTILLATION THERAPY  1990s  . DX LAPAROSCOPY/  DX HYSTEROSCOPY/  D & C  08-07-2007   dr dove  [facility]  . ENDOMETRIAL ABLATION W/ NOVASURE  10-21-2008   dr Harolyn Rutherford  [facility]  . ESOPHAGEAL MANOMETRY N/A 09/22/2015   Procedure: ESOPHAGEAL MANOMETRY (EM);  Surgeon: Doran Stabler, MD;  Location: [facility];  Service: Gastroenterology;  Laterality: N/A;  with impedence   . FINGER ARTHROPLASTY Bilateral    left little finger 2016:  right middle finger 06/ 2018  . KNEE ARTHROSCOPY Bilateral x3 left ;  x3  right-- last one age 11 (83)  . NASAL SEPTUM SURGERY  age 25s  . ROBOT ASSISTED REDUCTION PARAESOPHAGEAL HIATAL HERNIA/ TYPE 2 MEDIASTINAL DISSECTION/ PRIMARY HIATAL HERNIA REPAIR/ ANTERIOR & POSTERIOR GASTROPEXY/ NISSEN FUNDOPLICATION  [phone]   DR GROSS  [facility]  . SINOSCOPY    . TEMPOROMANDIBULAR JOINT SURGERY Bilateral x3  -- age 87s   per pt used graft  . TONSILLECTOMY    . TRIGGER FINGER RELEASE Bilateral last one 2017   several release's bilaterally  . ULNAR NERVE TRANSPOSITION Bilateral age 36 (1980)    No current facility-administered medications for this encounter.    Current Outpatient Medications  Medication Sig Dispense Refill Last Dose  . albuterol (PROVENTIL HFA;VENTOLIN HFA) 108 (90 Base) MCG/ACT inhaler Inhale 1-2 puffs into the lungs daily as needed for wheezing or shortness of breath. 54 Inhaler 1 Taking  . albuterol (PROVENTIL) (2.5 MG/3ML) 0.083% nebulizer solution TAKE 3 MLS BY  NEBULIZATION EVERY 6 HOURS AS NEEDED FOR WHEEZING OR SHORTNESS OF BREATH. 75 mL 1 Taking  . alendronate (FOSAMAX) 70 MG tablet TAKE 1 TABLET BY MOUTH EVERY 7 DAYS WITH FULL GLASS OF WATER ON EMPTY STOMACH 12 tablet 2 Taking  . amitriptyline (ELAVIL) 10 MG tablet TAKE 1 TABLET BY MOUTH EVERYDAY AT BEDTIME  1 Taking  . anti-nausea (EMETROL) solution Take 30 mLs by mouth every 15 (fifteen) minutes as needed for nausea or vomiting.   Taking  . Azelastine HCl 0.15 % SOLN Place 2 sprays into the nose daily as needed. 30 mL 3 Taking  . Carbinoxamine Maleate 4 MG TABS Take 1 tablet (4 mg total) by mouth every 8 (eight) hours as needed. 28 each 1 Taking  . Cholecalciferol (D3 VITAMIN PO) Take 5,000 Units by mouth daily.   Taking  . ELMIRON 100 MG capsule Take 100 mg by mouth 2 (two) times daily.   1 Taking  . estradiol (  ESTRACE) 0.1 MG/GM vaginal cream Place 1 Applicatorful vaginally once a week. 42.5 g 2 Taking  . fluticasone (FLOVENT HFA) 220 MCG/ACT inhaler TAKE 2 PUFFS BY MOUTH TWICE A DAY 12 Inhaler 3 Taking  . gabapentin (NEURONTIN) 300 MG capsule Take 300 mg by mouth 3 (three) times daily.    Taking  . hydrocortisone cream 1 % Apply 1 application topically 2 (two) times daily as needed for itching.    Taking  . hydrOXYzine (ATARAX/VISTARIL) 25 MG tablet Take 25 mg by mouth 2 (two) times daily.  1 Taking  . ipratropium (ATROVENT) 0.06 % nasal spray Place 2 sprays into both nostrils every 8 (eight) hours as needed for rhinitis. 15 mL 1 Taking  . levothyroxine (SYNTHROID, LEVOTHROID) 50 MCG tablet TAKE 1 TABLET EVERY DAY--- takes in am 90 tablet 1 Taking  . loperamide (IMODIUM A-D) 2 MG tablet Take 2 mg by mouth 4 (four) times daily as needed for diarrhea or loose stools.   Taking  . mometasone (NASONEX) 50 MCG/ACT nasal spray Place 2 sprays into the nose every morning.   Taking  . montelukast (SINGULAIR) 10 MG tablet Take 1 tablet (10 mg total) by mouth at bedtime. (Patient taking differently: Take 10  mg by mouth at bedtime. ) 30 tablet 10 Taking  . primidone (MYSOLINE) 50 MG tablet Take 1 tablet (50 mg total) by mouth 2 (two) times daily. 180 tablet 1 Taking  . rizatriptan (MAXALT-MLT) 10 MG disintegrating tablet TAKE 1 TABLET EVERY DAY AS NEEDED FOR MIGRAINE, MAY REPEAT IN 2 HRS IF NEEDED. MAX 2 DOSES/WK 9 tablet 3 Taking  . rosuvastatin (CRESTOR) 20 MG tablet TAKE 1 TABLET BY MOUTH EVERY DAY 90 tablet 1 Taking  . sertraline (ZOLOFT) 100 MG tablet Take 100 mg by mouth daily.   Taking  . umeclidinium bromide (INCRUSE ELLIPTA) 62.5 MCG/INH AEPB Inhale 1 puff into the lungs daily. 30 each 4 Taking  . ziprasidone (GEODON) 40 MG capsule Take 40 mg by mouth 2 (two) times daily.  2 Taking  . Zoster Vaccine Adjuvanted [facility]) injection 0.5 ml in muscle and repeat in 8 weeks 0.5 mL 1 Taking   Allergies  Allergen Reactions  . Indomethacin Other (See Comments)    Muscle spasms Causes muscle spasms in neck  . Amlodipine Swelling    Peripheral edema  . Pseudoephedrine Other (See Comments)    I fly off the walls  CANNOT SLEEP  . Risperidone And Related Other (See Comments)    Stomach upset, insomnia, drooling, tremors, "jerks," sensitivity to touch.   . Bupivacaine Other (See Comments)    Will have hives  . Hydrocodone-Acetaminophen Other (See Comments)  . Pentazocine Other (See Comments)    Unknown reaction MADE ME "LACTATE"  . Propoxyphene Itching and Nausea And Vomiting    DARVOCET  . Risperidone Other (See Comments)    UNKNOWN REACTION  . Sulfa Antibiotics Other (See Comments)    Unknown reaction  . Tramadol Other (See Comments)    Patient can not remember  . Aspirin Other (See Comments)    REACTION: upset stomach Cannot recall reaction.   . Bupivacaine Hcl Hives  . Codeine Itching and Other (See Comments)    Anything related to codeine  . Etodolac Rash and Other (See Comments)    UNKNOWN REACTION  . Ivp Dye [Iodinated Diagnostic Agents] Itching    Flushed and Fever, itch  all over  . Pentazocine Lactate Itching    oozing  . Propranolol Nausea Only and  Other (See Comments)  . Tramadol Hcl Other (See Comments)    unknown    Social History   Tobacco Use  . Smoking status: Never Smoker  . Smokeless tobacco: Never Used  Substance Use Topics  . Alcohol use: Yes    Alcohol/week: 0.0 standard drinks    Comment: once every 3-4 months    Family History  Problem Relation Age of Onset  . Hypertension Mother        deceased from MVA complications  . Thyroid disease Mother   . Allergic rhinitis Mother   . Testicular cancer Father   . Allergic rhinitis Father   . Colon polyps Sister   . Coronary artery disease Unknown      Review of Systems  Constitutional: Negative for chills and fever.  HENT: Negative for congestion, sore throat and tinnitus.   Eyes: Negative for double vision, photophobia and pain.  Respiratory: Negative for cough, shortness of breath and wheezing.   Cardiovascular: Negative for chest pain, palpitations and orthopnea.  Gastrointestinal: Negative for heartburn, nausea and vomiting.  Genitourinary: Negative for dysuria, frequency and urgency.  Musculoskeletal: Positive for joint pain.  Neurological: Negative for dizziness, weakness and headaches.    Objective:  Physical Exam  General: Alert and oriented x3, cooperative and pleasant, no acute distress. Head: normocephalic, atraumatic, neck supple. Eyes: EOMI. Respiratory: breath sounds clear in all fields, no wheezing, rales, or rhonchi. Cardiovascular: Regular rate and rhythm, no murmurs, gallops or rubs.  Abdomen: non-tender to palpation and soft, normoactive bowel sounds. Musculoskeletal: Significantly antalgic gait pattern favoring the left side without the use of assisted devices.  Right Hip Exam: ROM: Normal without discomfort. There is no tenderness over the greater trochanter bursa. There is no pain on provocative testing of the hip. Right Knee Exam: No effusion. Range of  motion is 0-115 degrees. Moderate crepitus on range of motion of the knee. Positive medial, greater than lateral, joint line tenderness. Stable knee. Left Hip Exam: ROM: Normal without discomfort. There is no tenderness over the greater trochanter bursa. There is no pain on provocative testing of the hip. Left Knee Exam: No effusion. Range of motion is 10-115 degrees. Marked crepitus on range of motion of the knee. Positive medial, greater than lateral, joint line tenderness. Stable knee. Calves soft and nontender. Motor function intact in LE. Strength 5/5 LE bilaterally. Neuro: Distal pulses 2+. Sensation to light touch intact in LE.  Vital signs in last 24 hours: Blood pressure: 150/90 mmHg Pulse: 88 bpm  Labs:   Estimated body mass index is 31.33 kg/m as calculated from the following:   Height as of 11/28/17: 5' (1.524 m).   Weight as of 11/28/17: 72.8 kg.   Imaging Review Plain radiographs demonstrate severe degenerative joint disease of the left knee(s). The overall alignment isneutral. The bone quality appears to be adequate for age and reported activity level.   Preoperative templating of the joint replacement has been completed, documented, and submitted to the Operating Room personnel in order to optimize intra-operative equipment management.   Anticipated LOS equal to or greater than 2 midnights due to - Age 9 and older with one or more of the following:  - Obesity  - Expected need for hospital services (PT, OT, Nursing) required for safe  discharge  - Anticipated need for postoperative skilled nursing care or inpatient rehab  - Active co-morbidities: None OR   - Unanticipated findings during/Post Surgery: None  - Patient is a high risk of re-admission due  to: None     Assessment/Plan:  End stage arthritis, left knee   The patient history, physical examination, clinical judgment of the provider and imaging studies are consistent with end stage degenerative joint  disease of the left knee(s) and total knee arthroplasty is deemed medically necessary. The treatment options including medical management, injection therapy arthroscopy and arthroplasty were discussed at length. The risks and benefits of total knee arthroplasty were presented and reviewed. The risks due to aseptic loosening, infection, stiffness, patella tracking problems, thromboembolic complications and other imponderables were discussed. The patient acknowledged the explanation, agreed to proceed with the plan and consent was signed. Patient is being admitted for inpatient treatment for surgery, pain control, PT, OT, prophylactic antibiotics, VTE prophylaxis, progressive ambulation and ADL's and discharge planning. The patient is planning to be discharged home with outpatient physical therapy.   Therapy Plans: outpatient physical therapy at [facility] Disposition: Home with friend Planned DVT Prophylaxis: Xarelto 10 mg daily (hx of melanoma, ASA allergy) DME needed: [facility] Rile PCP: Betty Martinique TXA: IV Allergies: Aspirin (nausea, tinnitus), codeine (itching), bupivacaine (hives) Anesthesia Concerns: None  - Patient was instructed on what medications to stop prior to surgery. - Follow-up visit in 2 weeks with Dr. Wynelle Link - Begin physical therapy following surgery - Pre-operative lab work as pre-surgical testing - Prescriptions will be provided in hospital at time of discharge  Theresa Duty, PA-C Orthopedic Surgery [facility] [place]

## 2017-12-02 DIAGNOSIS — J4520 Mild intermittent asthma, uncomplicated: Secondary | ICD-10-CM | POA: Diagnosis not present

## 2017-12-05 DIAGNOSIS — D225 Melanocytic nevi of trunk: Secondary | ICD-10-CM | POA: Diagnosis not present

## 2017-12-05 DIAGNOSIS — Z23 Encounter for immunization: Secondary | ICD-10-CM | POA: Diagnosis not present

## 2017-12-05 DIAGNOSIS — L720 Epidermal cyst: Secondary | ICD-10-CM | POA: Diagnosis not present

## 2017-12-05 DIAGNOSIS — L821 Other seborrheic keratosis: Secondary | ICD-10-CM | POA: Diagnosis not present

## 2017-12-05 DIAGNOSIS — Z85828 Personal history of other malignant neoplasm of skin: Secondary | ICD-10-CM | POA: Diagnosis not present

## 2017-12-05 DIAGNOSIS — Z86018 Personal history of other benign neoplasm: Secondary | ICD-10-CM | POA: Diagnosis not present

## 2017-12-05 DIAGNOSIS — L219 Seborrheic dermatitis, unspecified: Secondary | ICD-10-CM | POA: Diagnosis not present

## 2017-12-05 DIAGNOSIS — L814 Other melanin hyperpigmentation: Secondary | ICD-10-CM | POA: Diagnosis not present

## 2017-12-06 ENCOUNTER — Ambulatory Visit (HOSPITAL_COMMUNITY): Payer: Medicare HMO | Attending: Cardiology

## 2017-12-06 ENCOUNTER — Other Ambulatory Visit: Payer: Self-pay

## 2017-12-06 DIAGNOSIS — I10 Essential (primary) hypertension: Secondary | ICD-10-CM | POA: Diagnosis not present

## 2017-12-06 DIAGNOSIS — G4733 Obstructive sleep apnea (adult) (pediatric): Secondary | ICD-10-CM | POA: Diagnosis not present

## 2017-12-06 DIAGNOSIS — R000 Tachycardia, unspecified: Secondary | ICD-10-CM | POA: Diagnosis not present

## 2017-12-06 DIAGNOSIS — R0609 Other forms of dyspnea: Secondary | ICD-10-CM | POA: Diagnosis not present

## 2017-12-06 DIAGNOSIS — I493 Ventricular premature depolarization: Secondary | ICD-10-CM | POA: Insufficient documentation

## 2017-12-06 DIAGNOSIS — E785 Hyperlipidemia, unspecified: Secondary | ICD-10-CM | POA: Insufficient documentation

## 2017-12-10 ENCOUNTER — Other Ambulatory Visit: Payer: Self-pay | Admitting: Allergy and Immunology

## 2017-12-12 ENCOUNTER — Encounter: Payer: Self-pay | Admitting: Pulmonary Disease

## 2017-12-12 ENCOUNTER — Ambulatory Visit: Payer: Medicare HMO | Admitting: Pulmonary Disease

## 2017-12-12 ENCOUNTER — Telehealth: Payer: Self-pay | Admitting: Cardiology

## 2017-12-12 VITALS — BP 116/76 | HR 91 | Ht 60.0 in | Wt 159.0 lb

## 2017-12-12 DIAGNOSIS — G4733 Obstructive sleep apnea (adult) (pediatric): Secondary | ICD-10-CM

## 2017-12-12 DIAGNOSIS — Z9989 Dependence on other enabling machines and devices: Secondary | ICD-10-CM

## 2017-12-12 DIAGNOSIS — Z789 Other specified health status: Secondary | ICD-10-CM | POA: Diagnosis not present

## 2017-12-12 NOTE — Telephone Encounter (Signed)
  Would like results of echo

## 2017-12-12 NOTE — Patient Instructions (Signed)
Will have [facility] refit your CPAP mask  Follow up in 1 year

## 2017-12-12 NOTE — Telephone Encounter (Signed)
Returned call to patient echo results given.She stated she has noticed for the past 2 months her pulse is elevated ranging 70 to 116.Stated pulse is always in 70's.She wanted to know if she is clear to have knee surgery.I will ask to [place].

## 2017-12-12 NOTE — Progress Notes (Signed)
[facility]  Chief Complaint  Patient presents with  . Follow-up    DME-[facility], pt has had machine issues for months. Has an appt to have fixed this Friday. Pt cannot sleep without cpap machine, does well with it when it is working properly.    Constitutional:  BP 116/76 (BP Location: Left Arm, Cuff Size: Normal)   Pulse 91   Ht 5' (1.524 m)   Wt 159 lb (72.1 kg)   SpO2 96%   BMI 31.05 kg/m   Past Medical History:  Tremor, Bipolar, Claustrophobia, Depression, Eczema, GERD, Hiatal hernia, Melanoma, Panic attacks, Hypothyroidism, Interstitial cystitis, TMJ, Migraine, Asthma, OA, Rosacea, Urticaria  Brief Summary:  [name] is a 62 y.o. female with obstructive sleep apnea.  Her machine has been making a squeaking noise.  This wakes her up at night.  She hasn't been able to use CPAP.  She finally got an appointment with [facility] later this week.  She has full face mask.  She was told that her insurance would only allow full face mask.  She has TMJ.  Has trouble wearing anything around her ears.  She has special pillow that only lies on her cheeks and not her ears. She wakes up with teeth clenching from wearing CPAP mask.  Since she hasn't been able to use CPAP recently, she is more tired during the day and getting more headaches.   Physical Exam:   Appearance - well kempt   ENMT - clear nasal mucosa, midline nasal  septum, no oral exudates, no LAN, trachea midline, over bite, mild TMJ tenderness  Respiratory - normal chest wall, normal respiratory effort, no accessory muscle use, no wheeze/rales  CV - s1s2 regular rate and rhythm, no murmurs, no peripheral edema, radial pulses symmetric  GI - soft, non tender, no masses  Lymph - no adenopathy noted in neck and axillary areas  MSK - normal gait  Ext - no cyanosis, clubbing, or joint inflammation noted  Skin - no rashes, lesions, or ulcers  Neuro - normal strength, oriented x 3  Psych  - normal mood and affect   Assessment/Plan:   Obstructive sleep apnea. - she is having trouble with machine making noise that causes disrupted sleep - has appointment with [facility] this week to address - will arrange for CPAP mask refitting - continue Auto CPAP 5 to 12 cm H2O   History of TMJ. - advised her to d/w her dentist about whether she needs an oral appliance to help with teeth clenching and grinding   Patient Instructions  Will have [facility] refit your CPAP mask  Follow up in 1 year    Chesley Mires, MD [place] Pager: [phone] 12/12/2017, 11:55 AM  Flow Sheet    Sleep tests:  HST 10/15/15 >> AHI 12.8, SpO2 low 88%  Cardiac tests:  Echo 12/06/17 >> EF 55 to 60%, grade 1 DD  Medications:   Allergies as of 12/12/2017      Reactions   Indomethacin Other (See Comments)   Muscle spasms Causes muscle spasms in neck   Amlodipine Swelling   Peripheral edema   Pseudoephedrine Other (See Comments)   I fly off the walls  CANNOT SLEEP   Risperidone And Related Other (See Comments)   Stomach upset, insomnia, drooling, tremors, "jerks," sensitivity to touch.    Bupivacaine Other (See Comments)   Will have hives   Hydrocodone-acetaminophen Other (See Comments)   Pentazocine Other (See Comments)  Unknown reaction MADE ME "LACTATE"   Propoxyphene Itching, Nausea And Vomiting   DARVOCET   Risperidone Other (See Comments)   UNKNOWN REACTION   Sulfa Antibiotics Other (See Comments)   Unknown reaction   Tramadol Other (See Comments)   Patient can not remember   Aspirin Other (See Comments)   REACTION: upset stomach Cannot recall reaction.    Bupivacaine Hcl Hives   Codeine Itching, Other (See Comments)   Anything related to codeine   Etodolac Rash, Other (See Comments)   UNKNOWN REACTION   Ivp Dye [iodinated Diagnostic Agents] Itching   Flushed and Fever, itch all over   Pentazocine Lactate Itching   oozing   Propranolol  Nausea Only, Other (See Comments)   Tramadol Hcl Other (See Comments)   unknown      Medication List        Accurate as of 12/12/17 11:55 AM. Always use your most recent med list.          albuterol 108 (90 Base) MCG/ACT inhaler Commonly known as:  PROVENTIL HFA;VENTOLIN HFA Inhale 1-2 puffs into the lungs daily as needed for wheezing or shortness of breath.   albuterol (2.5 MG/3ML) 0.083% nebulizer solution Commonly known as:  PROVENTIL TAKE 3 MLS BY NEBULIZATION EVERY 6 HOURS AS NEEDED FOR WHEEZING OR SHORTNESS OF BREATH.   alendronate 70 MG tablet Commonly known as:  FOSAMAX TAKE 1 TABLET BY MOUTH EVERY 7 DAYS WITH FULL GLASS OF WATER ON EMPTY STOMACH   amitriptyline 10 MG tablet Commonly known as:  ELAVIL TAKE 1 TABLET BY MOUTH EVERYDAY AT BEDTIME   anti-nausea solution Take 30 mLs by mouth every 15 (fifteen) minutes as needed for nausea or vomiting.   Azelastine HCl 0.15 % Soln Place 2 sprays into the nose daily as needed.   Carbinoxamine Maleate 4 MG Tabs TAKE 1 TABLET BY MOUTH EVERY 8 HOURS AS NEEDED   D3 VITAMIN PO Take 5,000 Units by mouth daily.   ELMIRON 100 MG capsule Generic drug:  pentosan polysulfate Take 100 mg by mouth 2 (two) times daily.   estradiol 0.1 MG/GM vaginal cream Commonly known as:  ESTRACE Place 1 Applicatorful vaginally once a week.   fluticasone 220 MCG/ACT inhaler Commonly known as:  FLOVENT HFA TAKE 2 PUFFS BY MOUTH TWICE A DAY   gabapentin 300 MG capsule Commonly known as:  NEURONTIN Take 300 mg by mouth 3 (three) times daily.   hydrocortisone cream 1 % Apply 1 application topically 2 (two) times daily as needed for itching.   hydrOXYzine 25 MG tablet Commonly known as:  ATARAX/VISTARIL Take 25 mg by mouth 2 (two) times daily.   levothyroxine 50 MCG tablet Commonly known as:  SYNTHROID, LEVOTHROID TAKE 1 TABLET EVERY DAY--- takes in am   loperamide 2 MG tablet Commonly known as:  IMODIUM A-D Take 2 mg by mouth 4  (four) times daily as needed for diarrhea or loose stools.   mometasone 50 MCG/ACT nasal spray Commonly known as:  NASONEX Place 2 sprays into the nose every morning.   montelukast 10 MG tablet Commonly known as:  SINGULAIR Take 1 tablet (10 mg total) by mouth at bedtime.   primidone 50 MG tablet Commonly known as:  MYSOLINE Take 1 tablet (50 mg total) by mouth 2 (two) times daily.   rizatriptan 10 MG disintegrating tablet Commonly known as:  MAXALT-MLT TAKE 1 TABLET EVERY DAY AS NEEDED FOR MIGRAINE, MAY REPEAT IN 2 HRS IF NEEDED. MAX 2 DOSES/WK  rosuvastatin 20 MG tablet Commonly known as:  CRESTOR TAKE 1 TABLET BY MOUTH EVERY DAY   sertraline 100 MG tablet Commonly known as:  ZOLOFT Take 100 mg by mouth daily.   umeclidinium bromide 62.5 MCG/INH Aepb Commonly known as:  INCRUSE ELLIPTA Inhale 1 puff into the lungs daily.   ziprasidone 40 MG capsule Commonly known as:  GEODON Take 40 mg by mouth 2 (two) times daily.   Zoster Vaccine Adjuvanted injection Commonly known as:  SHINGRIX 0.5 ml in muscle and repeat in 8 weeks       Past Surgical History:  She  has a past surgical history that includes Carpal tunnel release (Left, age 55 (52)); Ulnar nerve transposition (Bilateral, age 73 (54)); Bunionectomy (Right, early 2000s); Trigger finger release (Bilateral, last one 2017); Nasal septum surgery (age 47s); Esophageal manometry (N/A, 09/22/2015); 24 hour ph study (N/A, 09/22/2015); Breast excisional biopsy (Right, 04-16-2003  dr Mamie Nick. young  [place]); DX LAPAROSCOPY/  DX HYSTEROSCOPY/  D & C (08-07-2007   dr dove  [facility]); Endometrial ablation w/ novasure (10-21-2008   dr Harolyn Rutherford  [facility]); ROBOT ASSISTED REDUCTION PARAESOPHAGEAL HIATAL HERNIA/ TYPE 2 MEDIASTINAL DISSECTION/ PRIMARY HIATAL HERNIA REPAIR/ ANTERIOR & POSTERIOR GASTROPEXY/ NISSEN FUNDOPLICATION ([phone]   DR GROSS  [facility]); Anal fissure repair (age 87  (19)); Temporomandibular joint surgery (Bilateral, x3  -- age 55s);  Cholecystectomy open (age 69); Finger arthroplasty (Bilateral); Knee arthroscopy (Bilateral, x3 left ;  x3  right-- last one age 26 (1999)); CYSTO/ HYDRODISTENTION/  INSTILLATION THERAPY (1990s); cysto with hydrodistension (N/A, 06/02/2017); Adenoidectomy; Tonsillectomy; and Sinoscopy.  Family History:  Her family history includes Allergic rhinitis in her father and mother; Colon polyps in her sister; Coronary artery disease in her unknown relative; Hypertension in her mother; Testicular cancer in her father; Thyroid disease in her mother.  Social History:  She  reports that she has never smoked. She has never used smokeless tobacco. She reports that she drinks alcohol. She reports that she does not use drugs.

## 2017-12-14 DIAGNOSIS — R69 Illness, unspecified: Secondary | ICD-10-CM | POA: Diagnosis not present

## 2017-12-15 NOTE — Telephone Encounter (Signed)
Returned call to patient Dr.Jordan advised ok to have knee surgery.Patient concerned about mild grade 1 diastolic dysfunction.Advised normal for age.

## 2017-12-16 ENCOUNTER — Telehealth: Payer: Self-pay | Admitting: Allergy and Immunology

## 2017-12-16 ENCOUNTER — Telehealth: Payer: Self-pay | Admitting: Family Medicine

## 2017-12-16 NOTE — Telephone Encounter (Signed)
Pt calling to f/u on reqeusting for another Rx for the Shingrix vaccine be mailed to her at [address] [place], [place] [address]. Pt requests a call back. Cb# [phone]    Copied from [place] [phone]. Topic: General - Other >> Dec 07, 2017  1:59 PM Yvette Rack wrote: Reason for CRM: Pt states she misplaced the Rx for the Shingrix vaccine. Pt request that another Rx for the Shingrix vaccine be mailed to her at [address] [place], [place] [address]. Pt requests a call back. Cb# [phone]

## 2017-12-16 NOTE — Telephone Encounter (Signed)
Patient states she was switched off spiriva and give incruse ellipta She has read and checked with the pharmacy - ellipta is for COPD Patient has asthma not COPD - she has questions as to why she was switched  Please call

## 2017-12-16 NOTE — Telephone Encounter (Signed)
Spoke with the patient and she advised that she does not like the Incruse because of the powder and even after rinsing her mouth it is still dry. Patient stated that the Spiriva only cost her $8.50 when she picked the prescription up and that she is ok with the cost. The pharmacy initiated the request to change the inhalers to begin with due to saving the patient cost. The patient stated that she is fine with the cost of Spiriva and would prefer to switch back. Will you please advise that it is ok for her to switch back and send in a new prescription?

## 2017-12-16 NOTE — Telephone Encounter (Signed)
Pharmacy contacted [place] on 8/27 and requested medication change due to cost.  Attempted to contact patient to let her know. LM for return call.

## 2017-12-19 ENCOUNTER — Other Ambulatory Visit: Payer: Self-pay | Admitting: *Deleted

## 2017-12-19 MED ORDER — ZOSTER VAC RECOMB ADJUVANTED 50 MCG/0.5ML IM SUSR: INTRAMUSCULAR | 1 refills | Status: DC

## 2017-12-19 MED ORDER — TIOTROPIUM BROMIDE MONOHYDRATE 1.25 MCG/ACT IN AERS: 2.0000 | INHALATION_SPRAY | Freq: Every day | RESPIRATORY_TRACT | 5 refills | Status: DC

## 2017-12-19 NOTE — Telephone Encounter (Signed)
Spoke with patient and informed her that her Rx is in the mail. Patient verbalized understanding.

## 2017-12-19 NOTE — Telephone Encounter (Signed)
Prescription has been sent in. Called patient and advised, confirmed pharmacy. Prescription has been sent to [facility] on [address]. Patient verbalized understanding.

## 2017-12-19 NOTE — Telephone Encounter (Signed)
Yes, that is fine. Thanks. 

## 2017-12-20 ENCOUNTER — Other Ambulatory Visit: Payer: Self-pay | Admitting: Allergy and Immunology

## 2017-12-21 ENCOUNTER — Other Ambulatory Visit: Payer: Self-pay

## 2017-12-21 ENCOUNTER — Encounter (HOSPITAL_COMMUNITY): Payer: Self-pay

## 2017-12-21 ENCOUNTER — Encounter (HOSPITAL_COMMUNITY)
Admission: RE | Admit: 2017-12-21 | Discharge: 2017-12-21 | Disposition: A | Discharge status: Home / Self Care | Payer: Medicare HMO | Source: Ambulatory Visit | Attending: Orthopedic Surgery | Admitting: Orthopedic Surgery

## 2017-12-21 DIAGNOSIS — Z01812 Encounter for preprocedural laboratory examination: Secondary | ICD-10-CM | POA: Insufficient documentation

## 2017-12-21 HISTORY — DX: Palpitations: R00.2

## 2017-12-21 HISTORY — DX: Ventricular premature depolarization: I49.3

## 2017-12-21 HISTORY — DX: Moderate persistent asthma, uncomplicated: J45.40

## 2017-12-21 HISTORY — DX: Anxiety disorder, unspecified: F41.9

## 2017-12-21 HISTORY — DX: Frequency of micturition: R35.0

## 2017-12-21 LAB — COMPREHENSIVE METABOLIC PANEL WITH GFR
ALT: 38 U/L (ref 0–44)
AST: 37 U/L (ref 15–41)
Albumin: 4.4 g/dL (ref 3.5–5.0)
Alkaline Phosphatase: 85 U/L (ref 38–126)
Anion gap: 12 (ref 5–15)
BUN: 9 mg/dL (ref 8–23)
CO2: 26 mmol/L (ref 22–32)
Calcium: 9.6 mg/dL (ref 8.9–10.3)
Chloride: 101 mmol/L (ref 98–111)
Creatinine, Ser: 0.88 mg/dL (ref 0.44–1.00)
GFR calc Af Amer: >60 mL/min
GFR calc non Af Amer: >60 mL/min
Glucose, Bld: 125 mg/dL — ABNORMAL HIGH (ref 70–99)
Potassium: 4.1 mmol/L (ref 3.5–5.1)
Sodium: 139 mmol/L (ref 135–145)
Total Bilirubin: 0.8 mg/dL (ref 0.3–1.2)
Total Protein: 6.9 g/dL (ref 6.5–8.1)

## 2017-12-21 LAB — APTT: aPTT: 28 seconds (ref 24–36)

## 2017-12-21 LAB — SURGICAL PCR SCREEN
MRSA, PCR: NEGATIVE
Staphylococcus aureus: NEGATIVE

## 2017-12-21 LAB — CBC
HCT: 43.9 % (ref 36.0–46.0)
Hemoglobin: 14.2 g/dL (ref 12.0–15.0)
MCH: 30.1 pg (ref 26.0–34.0)
MCHC: 32.3 g/dL (ref 30.0–36.0)
MCV: 93.2 fL (ref 80.0–100.0)
PLATELETS: 231 10*3/uL (ref 150–400)
RBC: 4.71 MIL/uL (ref 3.87–5.11)
RDW: 13.2 % (ref 11.5–15.5)
WBC: 11 10*3/uL — ABNORMAL HIGH (ref 4.0–10.5)
nRBC: 0 % (ref 0.0–0.2)

## 2017-12-21 LAB — PROTIME-INR
INR: 0.94
Prothrombin Time: 12.5 s (ref 11.4–15.2)

## 2017-12-21 NOTE — Progress Notes (Signed)
EKG dated 11-21-2017 in epic.  Cardiology note/ clearance, dr Martinique, dated 11-28-2017 in epic.  Pt pulmologist, dr Halford Chessman, lov note in epic dated 12-12-2017.

## 2017-12-21 NOTE — Patient Instructions (Addendum)
[name]  May 25, 1955    Your procedure is scheduled on:  12-26-2017    Report to [facility] Main  Entrance,  Report to admitting at  5:45 AM     Call this number if you have problems the morning of surgery [phone]        Remember: Do not eat food or drink liquids :After Midnight.                                       BRUSH YOUR TEETH MORNING OF SURGERY AND RINSE YOUR MOUTH OUT, NO CHEWING GUM CANDY OR MINTS.      Take these medicines the morning of surgery with A SIP OF WATER:  Hydroxyzine, Flovent inhaler, Spiriva inhaler, Primidone, Rosuvastatin, Gabapentin, Geodon,    Nasonex nasal spray, Elmiron, Levothyoxine,  Albuterol rescue inhaler and nebulizer as needed,  Bring all inhalers with you day of surgery                                    You may not have any metal on your body including hair pins and piercings              Do not wear jewelry, make-up, lotions, powders or perfumes, deodorant              Do not wear nail polish.  Do not shave  48 hours prior to surgery.         Do not bring valuables to the hospital. [place].  Contacts, dentures or bridgework may not be worn into surgery.  Leave suitcase in the car. After surgery it may be brought to your room.      Special Instructions:   BRING YOUR CPAP MASK AND TUBING WITH YOU DAY OF SURGERY  : _____________________________________________________________________             [facility] - Preparing for Surgery Before surgery, you can play an important role.  Because skin is not sterile, your skin needs to be as free of germs as possible.  You can reduce the number of germs on your skin by washing with CHG (chlorahexidine gluconate) soap before surgery.  CHG is an antiseptic cleaner which kills germs and bonds with the skin to continue killing germs even after washing. Please DO NOT use if you have an allergy to  CHG or antibacterial soaps.  If your skin becomes reddened/irritated stop using the CHG and inform your nurse when you arrive at Short Stay. Do not shave (including legs and underarms) for at least 48 hours prior to the first CHG shower.  You may shave your face/neck. Please follow these instructions carefully:  1.  Shower with CHG Soap the night before surgery and the  morning of Surgery.  2.  If you choose to wash your hair, wash your hair first as usual with your  normal  shampoo.  3.  After you shampoo, rinse your hair and body thoroughly to remove the  shampoo.  4.  Use CHG as you would any other liquid soap.  You can apply chg directly  to the skin and wash                       Gently with a scrungie or clean washcloth.  5.  Apply the CHG Soap to your body ONLY FROM THE NECK DOWN.   Do not use on face/ open                           Wound or open sores. Avoid contact with eyes, ears mouth and genitals (private parts).                       Wash face,  Genitals (private parts) with your normal soap.             6.  Wash thoroughly, paying special attention to the area where your surgery  will be performed.  7.  Thoroughly rinse your body with warm water from the neck down.  8.  DO NOT shower/wash with your normal soap after using and rinsing off  the CHG Soap.             9.  Pat yourself dry with a clean towel.            10.  Wear clean pajamas.            11.  Place clean sheets on your bed the night of your first shower and do not  sleep with pets. Day of Surgery : Do not apply any lotions/deodorants the morning of surgery.  Please wear clean clothes to the hospital/surgery center.  FAILURE TO FOLLOW THESE INSTRUCTIONS MAY RESULT IN THE CANCELLATION OF YOUR SURGERY PATIENT SIGNATURE_________________________________  NURSE SIGNATURE__________________________________  ________________________________________________________________________   [name]  An incentive spirometer is a tool that can help keep your lungs clear and active. This tool measures how well you are filling your lungs with each breath. Taking long deep breaths may help reverse or decrease the chance of developing breathing (pulmonary) problems (especially infection) following:  A long period of time when you are unable to move or be active. BEFORE THE PROCEDURE   If the spirometer includes an indicator to show your best effort, your nurse or respiratory therapist will set it to a desired goal.  If possible, sit up straight or lean slightly forward. Try not to slouch.  Hold the incentive spirometer in an upright position. INSTRUCTIONS FOR USE  1. Sit on the edge of your bed if possible, or sit up as far as you can in bed or on a chair. 2. Hold the incentive spirometer in an upright position. 3. Breathe out normally. 4. Place the mouthpiece in your mouth and seal your lips tightly around it. 5. Breathe in slowly and as deeply as possible, raising the piston or the ball toward the top of the column. 6. Hold your breath for 3-5 seconds or for as long as possible. Allow the piston or ball to fall to the bottom of the column. 7. Remove the mouthpiece from your mouth and breathe out normally. 8. Rest for a few seconds and repeat Steps 1 through 7 at least 10 times every 1-2 hours when you are awake. Take your time and take a few normal breaths between deep breaths. 9. The spirometer may include an indicator to show your best effort.  Use the indicator as a goal to work toward during each repetition. 10. After each set of 10 deep breaths, practice coughing to be sure your lungs are clear. If you have an incision (the cut made at the time of surgery), support your incision when coughing by placing a pillow or rolled up towels firmly against it. Once you are able to get out of bed, walk around indoors and cough well. You may stop using the incentive spirometer when  instructed by your caregiver.  RISKS AND COMPLICATIONS  Take your time so you do not get dizzy or light-headed.  If you are in pain, you may need to take or ask for pain medication before doing incentive spirometry. It is harder to take a deep breath if you are having pain. AFTER USE  Rest and breathe slowly and easily.  It can be helpful to keep track of a log of your progress. Your caregiver can provide you with a simple table to help with this. If you are using the spirometer at home, follow these instructions: [place] IF:   You are having difficultly using the spirometer.  You have trouble using the spirometer as often as instructed.  Your pain medication is not giving enough relief while using the spirometer.  You develop fever of 100.5 F (38.1 C) or higher. SEEK IMMEDIATE MEDICAL CARE IF:   You cough up bloody sputum that had not been present before.  You develop fever of 102 F (38.9 C) or greater.  You develop worsening pain at or near the incision site. MAKE SURE YOU:   Understand these instructions.  Will watch your condition.  Will get help right away if you are not doing well or get worse. Document Released: 06/07/2006 Document Revised: 04/19/2011 Document Reviewed: 08/08/2006 ExitCare Patient Information 2014 Exit[employer], [place].   ________________________________________________________________________  WHAT IS A BLOOD TRANSFUSION? Blood Transfusion Information  A transfusion is the replacement of blood or some of its parts. Blood is made up of multiple cells which provide different functions.  Red blood cells carry oxygen and are used for blood loss replacement.  White blood cells fight against infection.  Platelets control bleeding.  Plasma helps clot blood.  Other blood products are available for specialized needs, such as hemophilia or other clotting disorders. BEFORE THE TRANSFUSION  Who gives blood for transfusions?   Healthy  volunteers who are fully evaluated to make sure their blood is safe. This is blood bank blood. Transfusion therapy is the safest it has ever been in the practice of medicine. Before blood is taken from a donor, a complete history is taken to make sure that person has no history of diseases nor engages in risky social behavior (examples are intravenous drug use or sexual activity with multiple partners). The donor's travel history is screened to minimize risk of transmitting infections, such as malaria. The donated blood is tested for signs of infectious diseases, such as HIV and hepatitis. The blood is then tested to be sure it is compatible with you in order to minimize the chance of a transfusion reaction. If you or a relative donates blood, this is often done in anticipation of surgery and is not appropriate for emergency situations. It takes many days to process the donated blood. RISKS AND COMPLICATIONS Although transfusion therapy is very safe and saves many lives, the main dangers of transfusion include:   Getting an infectious disease.  Developing a transfusion reaction. This is an allergic reaction to something in the  blood you were given. Every precaution is taken to prevent this. The decision to have a blood transfusion has been considered carefully by your caregiver before blood is given. Blood is not given unless the benefits outweigh the risks. AFTER THE TRANSFUSION  Right after receiving a blood transfusion, you will usually feel much better and more energetic. This is especially true if your red blood cells have gotten low (anemic). The transfusion raises the level of the red blood cells which carry oxygen, and this usually causes an energy increase.  The nurse administering the transfusion will monitor you carefully for complications. HOME CARE INSTRUCTIONS  No special instructions are needed after a transfusion. You may find your energy is better. Speak with your caregiver about any  limitations on activity for underlying diseases you may have. SEEK MEDICAL CARE IF:   Your condition is not improving after your transfusion.  You develop redness or irritation at the intravenous (IV) site. SEEK IMMEDIATE MEDICAL CARE IF:  Any of the following symptoms occur over the next 12 hours:  Shaking chills.  You have a temperature by mouth above 102 F (38.9 C), not controlled by medicine.  Chest, back, or muscle pain.  People around you feel you are not acting correctly or are confused.  Shortness of breath or difficulty breathing.  Dizziness and fainting.  You get a rash or develop hives.  You have a decrease in urine output.  Your urine turns a dark color or changes to pink, red, or brown. Any of the following symptoms occur over the next 10 days:  You have a temperature by mouth above 102 F (38.9 C), not controlled by medicine.  Shortness of breath.  Weakness after normal activity.  The white part of the eye turns yellow (jaundice).  You have a decrease in the amount of urine or are urinating less often.  Your urine turns a dark color or changes to pink, red, or brown. Document Released: 01/23/2000 Document Revised: 04/19/2011 Document Reviewed: 09/11/2007 [facility] Patient Information 2014 [place], [place].  _______________________________________________________________________

## 2017-12-22 ENCOUNTER — Telehealth: Payer: Self-pay

## 2017-12-22 DIAGNOSIS — R69 Illness, unspecified: Secondary | ICD-10-CM | POA: Diagnosis not present

## 2017-12-22 NOTE — Telephone Encounter (Signed)
   Primary Cardiologist: Peter Martinique, MD  Chart reviewed as part of pre-operative protocol coverage. Given past medical history and time since last visit, based on ACC/AHA guidelines, JERRIS KELTZ would be at acceptable risk for the planned procedure without further cardiovascular testing. Was seen and cleared by Dr. Martinique 10/21/9.   I will route this recommendation to the requesting party via Epic fax function and remove from pre-op pool.  Please call with questions.  Cecilie Kicks, NP 12/22/2017, 4:42 PM

## 2017-12-22 NOTE — Telephone Encounter (Signed)
Spoke with Claiborne Billings from requesting office she states they have received pt's clearance

## 2017-12-22 NOTE — Telephone Encounter (Signed)
   [facility] Pre-operative Risk Assessment    Request for surgical clearance:  1. What type of surgery is being performed? Left Total Knee Replacement   2. When is this surgery scheduled? 12/26/17  3. What type of clearance is required (medical clearance vs. Pharmacy clearance to hold med vs. Both)? Medical  4. Are there any medications that need to be held prior to surgery and how long? None  5. Practice name and name of physician performing surgery? [facility]  Dr.Aluisio   6. What is your office phone number [phone]   7.   What is your office fax number [phone]  8.   Anesthesia type Choice   Kathyrn Lass 12/22/2017, 2:41 PM  _________________________________________________________________   (provider comments below)

## 2017-12-23 ENCOUNTER — Encounter (HOSPITAL_COMMUNITY): Payer: Self-pay

## 2017-12-26 ENCOUNTER — Encounter (HOSPITAL_COMMUNITY)
Admission: RE | Disposition: A | Discharge status: Home / Self Care | Payer: Self-pay | Source: Ambulatory Visit | Attending: Orthopedic Surgery

## 2017-12-26 ENCOUNTER — Encounter (HOSPITAL_COMMUNITY): Payer: Self-pay | Admitting: *Deleted

## 2017-12-26 ENCOUNTER — Other Ambulatory Visit: Payer: Self-pay

## 2017-12-26 ENCOUNTER — Ambulatory Visit (HOSPITAL_COMMUNITY): Payer: Medicare HMO | Admitting: Anesthesiology

## 2017-12-26 ENCOUNTER — Observation Stay (HOSPITAL_COMMUNITY)
Admission: RE | Admit: 2017-12-26 | Discharge: 2017-12-29 | Disposition: A | Discharge status: Home / Self Care | Payer: Medicare HMO | Source: Ambulatory Visit | Attending: Orthopedic Surgery | Admitting: Orthopedic Surgery

## 2017-12-26 ENCOUNTER — Other Ambulatory Visit: Payer: Self-pay | Admitting: Family Medicine

## 2017-12-26 DIAGNOSIS — E039 Hypothyroidism, unspecified: Secondary | ICD-10-CM | POA: Diagnosis not present

## 2017-12-26 DIAGNOSIS — E559 Vitamin D deficiency, unspecified: Secondary | ICD-10-CM

## 2017-12-26 DIAGNOSIS — G43909 Migraine, unspecified, not intractable, without status migrainosus: Secondary | ICD-10-CM | POA: Insufficient documentation

## 2017-12-26 DIAGNOSIS — Z85820 Personal history of malignant melanoma of skin: Secondary | ICD-10-CM | POA: Insufficient documentation

## 2017-12-26 DIAGNOSIS — E669 Obesity, unspecified: Secondary | ICD-10-CM | POA: Insufficient documentation

## 2017-12-26 DIAGNOSIS — Z6829 Body mass index (BMI) 29.0-29.9, adult: Secondary | ICD-10-CM | POA: Insufficient documentation

## 2017-12-26 DIAGNOSIS — E785 Hyperlipidemia, unspecified: Secondary | ICD-10-CM | POA: Insufficient documentation

## 2017-12-26 DIAGNOSIS — I10 Essential (primary) hypertension: Secondary | ICD-10-CM | POA: Diagnosis not present

## 2017-12-26 DIAGNOSIS — Z85828 Personal history of other malignant neoplasm of skin: Secondary | ICD-10-CM | POA: Insufficient documentation

## 2017-12-26 DIAGNOSIS — M810 Age-related osteoporosis without current pathological fracture: Secondary | ICD-10-CM | POA: Insufficient documentation

## 2017-12-26 DIAGNOSIS — Z9989 Dependence on other enabling machines and devices: Secondary | ICD-10-CM | POA: Diagnosis not present

## 2017-12-26 DIAGNOSIS — M1712 Unilateral primary osteoarthritis, left knee: Secondary | ICD-10-CM | POA: Diagnosis not present

## 2017-12-26 DIAGNOSIS — J4540 Moderate persistent asthma, uncomplicated: Secondary | ICD-10-CM | POA: Diagnosis not present

## 2017-12-26 DIAGNOSIS — Z885 Allergy status to narcotic agent status: Secondary | ICD-10-CM | POA: Diagnosis not present

## 2017-12-26 DIAGNOSIS — M25762 Osteophyte, left knee: Secondary | ICD-10-CM | POA: Insufficient documentation

## 2017-12-26 DIAGNOSIS — Z7951 Long term (current) use of inhaled steroids: Secondary | ICD-10-CM | POA: Insufficient documentation

## 2017-12-26 DIAGNOSIS — E876 Hypokalemia: Secondary | ICD-10-CM | POA: Insufficient documentation

## 2017-12-26 DIAGNOSIS — Z7983 Long term (current) use of bisphosphonates: Secondary | ICD-10-CM | POA: Diagnosis not present

## 2017-12-26 DIAGNOSIS — M1710 Unilateral primary osteoarthritis, unspecified knee: Secondary | ICD-10-CM | POA: Diagnosis present

## 2017-12-26 DIAGNOSIS — M25562 Pain in left knee: Secondary | ICD-10-CM | POA: Diagnosis present

## 2017-12-26 DIAGNOSIS — Z79899 Other long term (current) drug therapy: Secondary | ICD-10-CM | POA: Diagnosis not present

## 2017-12-26 DIAGNOSIS — G4733 Obstructive sleep apnea (adult) (pediatric): Secondary | ICD-10-CM | POA: Insufficient documentation

## 2017-12-26 DIAGNOSIS — F329 Major depressive disorder, single episode, unspecified: Secondary | ICD-10-CM | POA: Diagnosis not present

## 2017-12-26 DIAGNOSIS — Z79890 Hormone replacement therapy (postmenopausal): Secondary | ICD-10-CM | POA: Insufficient documentation

## 2017-12-26 DIAGNOSIS — Z886 Allergy status to analgesic agent status: Secondary | ICD-10-CM | POA: Insufficient documentation

## 2017-12-26 DIAGNOSIS — M179 Osteoarthritis of knee, unspecified: Secondary | ICD-10-CM | POA: Diagnosis present

## 2017-12-26 DIAGNOSIS — K219 Gastro-esophageal reflux disease without esophagitis: Secondary | ICD-10-CM | POA: Diagnosis not present

## 2017-12-26 HISTORY — PX: TOTAL KNEE ARTHROPLASTY: SHX125

## 2017-12-26 LAB — TYPE AND SCREEN
ABO/RH(D): A POS
Antibody Screen: NEGATIVE

## 2017-12-26 SURGERY — ARTHROPLASTY, KNEE, TOTAL
Anesthesia: General | Site: Knee | Laterality: Left

## 2017-12-26 MED ORDER — DEXAMETHASONE SODIUM PHOSPHATE 10 MG/ML IJ SOLN
INTRAMUSCULAR | Status: AC
  Filled 2017-12-26: qty 1

## 2017-12-26 MED ORDER — PHENYLEPHRINE 40 MCG/ML (10ML) SYRINGE FOR IV PUSH (FOR BLOOD PRESSURE SUPPORT)
PREFILLED_SYRINGE | INTRAVENOUS | Status: AC
  Filled 2017-12-26: qty 10

## 2017-12-26 MED ORDER — ALBUTEROL SULFATE (2.5 MG/3ML) 0.083% IN NEBU
2.5000 mg | INHALATION_SOLUTION | Freq: Four times a day (QID) | RESPIRATORY_TRACT | Status: DC | PRN
  Filled 2017-12-26: qty 3

## 2017-12-26 MED ORDER — HYDROMORPHONE HCL 2 MG/ML IJ SOLN
INTRAMUSCULAR | Status: AC
  Filled 2017-12-26: qty 1

## 2017-12-26 MED ORDER — PROPOFOL 10 MG/ML IV BOLUS
INTRAVENOUS | Status: AC
  Filled 2017-12-26: qty 40

## 2017-12-26 MED ORDER — CEFAZOLIN SODIUM-DEXTROSE 2-4 GM/100ML-% IV SOLN
2.0000 g | INTRAVENOUS | Status: AC
  Administered 2017-12-26: 2 g via INTRAVENOUS
  Filled 2017-12-26: qty 100

## 2017-12-26 MED ORDER — TRANEXAMIC ACID-NACL 1000-0.7 MG/100ML-% IV SOLN
1000.0000 mg | INTRAVENOUS | Status: AC
  Administered 2017-12-26: 1000 mg via INTRAVENOUS
  Filled 2017-12-26: qty 100

## 2017-12-26 MED ORDER — EMETROL 1.87-1.87-21.5 PO SOLN: 30.0000 mL | ORAL | Status: DC | PRN

## 2017-12-26 MED ORDER — RIVAROXABAN 10 MG PO TABS
10.0000 mg | ORAL_TABLET | Freq: Every day | ORAL | Status: DC
  Administered 2017-12-27 – 2017-12-29 (×3): 10 mg via ORAL
  Filled 2017-12-26 (×3): qty 1

## 2017-12-26 MED ORDER — ALBUTEROL SULFATE HFA 108 (90 BASE) MCG/ACT IN AERS: 1.0000 | INHALATION_SPRAY | Freq: Every day | RESPIRATORY_TRACT | Status: DC | PRN

## 2017-12-26 MED ORDER — FENTANYL CITRATE (PF) 100 MCG/2ML IJ SOLN
25.0000 ug | INTRAMUSCULAR | Status: DC | PRN
  Administered 2017-12-26 (×2): 25 ug via INTRAVENOUS

## 2017-12-26 MED ORDER — PHENYLEPHRINE HCL 10 MG/ML IJ SOLN
INTRAMUSCULAR | Status: DC | PRN
  Administered 2017-12-26: 60 ug via INTRAVENOUS

## 2017-12-26 MED ORDER — TRANEXAMIC ACID-NACL 1000-0.7 MG/100ML-% IV SOLN
1000.0000 mg | Freq: Once | INTRAVENOUS | Status: AC
  Administered 2017-12-26: 1000 mg via INTRAVENOUS
  Filled 2017-12-26: qty 100

## 2017-12-26 MED ORDER — FENTANYL CITRATE (PF) 100 MCG/2ML IJ SOLN
50.0000 ug | INTRAMUSCULAR | Status: DC
  Administered 2017-12-26: 25 ug via INTRAVENOUS
  Administered 2017-12-26 (×6): 50 ug via INTRAVENOUS
  Administered 2017-12-26: 25 ug via INTRAVENOUS
  Filled 2017-12-26: qty 2

## 2017-12-26 MED ORDER — 0.9 % SODIUM CHLORIDE (POUR BTL) OPTIME
TOPICAL | Status: DC | PRN
  Administered 2017-12-26: 1000 mL

## 2017-12-26 MED ORDER — LACTATED RINGERS IV SOLN
INTRAVENOUS | Status: DC
  Administered 2017-12-26 (×2): via INTRAVENOUS

## 2017-12-26 MED ORDER — GABAPENTIN 300 MG PO CAPS
300.0000 mg | ORAL_CAPSULE | Freq: Once | ORAL | Status: AC
  Administered 2017-12-26: 300 mg via ORAL
  Filled 2017-12-26: qty 1

## 2017-12-26 MED ORDER — DEXAMETHASONE SODIUM PHOSPHATE 10 MG/ML IJ SOLN
10.0000 mg | Freq: Once | INTRAMUSCULAR | Status: AC
  Administered 2017-12-27: 10 mg via INTRAVENOUS
  Filled 2017-12-26: qty 1

## 2017-12-26 MED ORDER — GABAPENTIN 300 MG PO CAPS
300.0000 mg | ORAL_CAPSULE | Freq: Three times a day (TID) | ORAL | Status: DC
  Administered 2017-12-26 – 2017-12-29 (×9): 300 mg via ORAL
  Filled 2017-12-26 (×9): qty 1

## 2017-12-26 MED ORDER — BISACODYL 10 MG RE SUPP: 10.0000 mg | Freq: Every day | RECTAL | Status: DC | PRN

## 2017-12-26 MED ORDER — FENTANYL CITRATE (PF) 250 MCG/5ML IJ SOLN
INTRAMUSCULAR | Status: AC
  Filled 2017-12-26: qty 5

## 2017-12-26 MED ORDER — OXYCODONE HCL 5 MG PO TABS
5.0000 mg | ORAL_TABLET | ORAL | Status: DC | PRN
  Administered 2017-12-26: 5 mg via ORAL
  Administered 2017-12-26 (×2): 10 mg via ORAL
  Administered 2017-12-26: 5 mg via ORAL
  Administered 2017-12-27 – 2017-12-29 (×12): 10 mg via ORAL
  Filled 2017-12-26 (×3): qty 2
  Filled 2017-12-26: qty 1
  Filled 2017-12-26 (×6): qty 2
  Filled 2017-12-26: qty 1
  Filled 2017-12-26 (×5): qty 2

## 2017-12-26 MED ORDER — PHENOL 1.4 % MT LIQD: 1.0000 | OROMUCOSAL | Status: DC | PRN

## 2017-12-26 MED ORDER — TIOTROPIUM BROMIDE MONOHYDRATE 1.25 MCG/ACT IN AERS: 2.0000 | INHALATION_SPRAY | Freq: Every morning | RESPIRATORY_TRACT | Status: DC

## 2017-12-26 MED ORDER — ZIPRASIDONE HCL 40 MG PO CAPS
40.0000 mg | ORAL_CAPSULE | Freq: Two times a day (BID) | ORAL | Status: DC
  Administered 2017-12-26 – 2017-12-29 (×6): 40 mg via ORAL
  Filled 2017-12-26 (×9): qty 1

## 2017-12-26 MED ORDER — FLEET ENEMA 7-19 GM/118ML RE ENEM: 1.0000 | ENEMA | Freq: Once | RECTAL | Status: DC | PRN

## 2017-12-26 MED ORDER — ACETAMINOPHEN 10 MG/ML IV SOLN
1000.0000 mg | Freq: Once | INTRAVENOUS | Status: AC
  Administered 2017-12-26: 1000 mg via INTRAVENOUS
  Filled 2017-12-26: qty 100

## 2017-12-26 MED ORDER — KETAMINE HCL 10 MG/ML IJ SOLN
INTRAMUSCULAR | Status: AC
  Filled 2017-12-26: qty 1

## 2017-12-26 MED ORDER — SODIUM CHLORIDE 0.9 % IR SOLN
Status: DC | PRN
  Administered 2017-12-26: 1000 mL

## 2017-12-26 MED ORDER — CEFAZOLIN SODIUM-DEXTROSE 1-4 GM/50ML-% IV SOLN
1.0000 g | Freq: Four times a day (QID) | INTRAVENOUS | Status: AC
  Administered 2017-12-26 (×2): 1 g via INTRAVENOUS
  Filled 2017-12-26 (×2): qty 50

## 2017-12-26 MED ORDER — POLYETHYLENE GLYCOL 3350 17 G PO PACK: 17.0000 g | PACK | Freq: Every day | ORAL | Status: DC | PRN

## 2017-12-26 MED ORDER — ACETAMINOPHEN 10 MG/ML IV SOLN: 1000.0000 mg | Freq: Once | INTRAVENOUS | Status: DC | PRN

## 2017-12-26 MED ORDER — OLOPATADINE HCL 0.1 % OP SOLN
1.0000 [drp] | Freq: Two times a day (BID) | OPHTHALMIC | Status: DC | PRN
  Filled 2017-12-26: qty 5

## 2017-12-26 MED ORDER — DOCUSATE SODIUM 100 MG PO CAPS
100.0000 mg | ORAL_CAPSULE | Freq: Two times a day (BID) | ORAL | Status: DC
  Administered 2017-12-26 – 2017-12-29 (×6): 100 mg via ORAL
  Filled 2017-12-26 (×6): qty 1

## 2017-12-26 MED ORDER — MIDAZOLAM HCL 2 MG/2ML IJ SOLN
INTRAMUSCULAR | Status: AC
  Filled 2017-12-26: qty 2

## 2017-12-26 MED ORDER — MIDAZOLAM HCL 2 MG/2ML IJ SOLN
1.0000 mg | INTRAMUSCULAR | Status: DC
  Administered 2017-12-26 (×2): 1 mg via INTRAVENOUS
  Filled 2017-12-26: qty 2

## 2017-12-26 MED ORDER — RIZATRIPTAN BENZOATE 10 MG PO TBDP: 10.0000 mg | ORAL_TABLET | ORAL | Status: DC | PRN

## 2017-12-26 MED ORDER — LOPERAMIDE HCL 2 MG PO CAPS: 2.0000 mg | ORAL_CAPSULE | Freq: Four times a day (QID) | ORAL | Status: DC | PRN

## 2017-12-26 MED ORDER — METOCLOPRAMIDE HCL 5 MG/ML IJ SOLN: 5.0000 mg | Freq: Three times a day (TID) | INTRAMUSCULAR | Status: DC | PRN

## 2017-12-26 MED ORDER — METHOCARBAMOL 500 MG IVPB - SIMPLE MED
INTRAVENOUS | Status: AC
  Administered 2017-12-26: 500 mg via INTRAVENOUS
  Filled 2017-12-26: qty 50

## 2017-12-26 MED ORDER — ROSUVASTATIN CALCIUM 20 MG PO TABS
20.0000 mg | ORAL_TABLET | Freq: Every morning | ORAL | Status: DC
  Administered 2017-12-26 – 2017-12-29 (×4): 20 mg via ORAL
  Filled 2017-12-26 (×4): qty 1

## 2017-12-26 MED ORDER — METOCLOPRAMIDE HCL 5 MG PO TABS: 5.0000 mg | ORAL_TABLET | Freq: Three times a day (TID) | ORAL | Status: DC | PRN

## 2017-12-26 MED ORDER — PROPOFOL 500 MG/50ML IV EMUL
INTRAVENOUS | Status: DC | PRN
  Administered 2017-12-26: 160 ug/kg/min via INTRAVENOUS

## 2017-12-26 MED ORDER — OXYCODONE HCL 5 MG/5ML PO SOLN
5.0000 mg | Freq: Once | ORAL | Status: DC | PRN
  Filled 2017-12-26: qty 5

## 2017-12-26 MED ORDER — PENTOSAN POLYSULFATE SODIUM 100 MG PO CAPS
100.0000 mg | ORAL_CAPSULE | Freq: Two times a day (BID) | ORAL | Status: DC
  Administered 2017-12-26 – 2017-12-29 (×6): 100 mg via ORAL
  Filled 2017-12-26 (×6): qty 1

## 2017-12-26 MED ORDER — CHLORHEXIDINE GLUCONATE 4 % EX LIQD: 60.0000 mL | Freq: Once | CUTANEOUS | Status: DC

## 2017-12-26 MED ORDER — DIPHENHYDRAMINE HCL 12.5 MG/5ML PO ELIX: 12.5000 mg | ORAL_SOLUTION | ORAL | Status: DC | PRN

## 2017-12-26 MED ORDER — METHOCARBAMOL 500 MG IVPB - SIMPLE MED
500.0000 mg | Freq: Four times a day (QID) | INTRAVENOUS | Status: DC | PRN
  Administered 2017-12-26: 500 mg via INTRAVENOUS
  Filled 2017-12-26: qty 50

## 2017-12-26 MED ORDER — METHOCARBAMOL 500 MG PO TABS
500.0000 mg | ORAL_TABLET | Freq: Four times a day (QID) | ORAL | Status: DC | PRN
  Administered 2017-12-26 – 2017-12-28 (×4): 500 mg via ORAL
  Filled 2017-12-26 (×4): qty 1

## 2017-12-26 MED ORDER — ACETAMINOPHEN 500 MG PO TABS: 1000.0000 mg | ORAL_TABLET | Freq: Once | ORAL | Status: DC | PRN

## 2017-12-26 MED ORDER — UMECLIDINIUM BROMIDE 62.5 MCG/INH IN AEPB
1.0000 | INHALATION_SPRAY | Freq: Every day | RESPIRATORY_TRACT | Status: DC
  Administered 2017-12-27 – 2017-12-29 (×3): 1 via RESPIRATORY_TRACT
  Filled 2017-12-26: qty 7

## 2017-12-26 MED ORDER — CARBINOXAMINE MALEATE 4 MG PO TABS: 4.0000 mg | ORAL_TABLET | Freq: Three times a day (TID) | ORAL | Status: DC | PRN

## 2017-12-26 MED ORDER — ONDANSETRON HCL 4 MG/2ML IJ SOLN
4.0000 mg | Freq: Four times a day (QID) | INTRAMUSCULAR | Status: DC | PRN
  Administered 2017-12-26: 4 mg via INTRAVENOUS
  Filled 2017-12-26: qty 2

## 2017-12-26 MED ORDER — BUDESONIDE 0.5 MG/2ML IN SUSP
0.5000 mg | Freq: Two times a day (BID) | RESPIRATORY_TRACT | Status: DC
  Administered 2017-12-26 – 2017-12-29 (×5): 0.5 mg via RESPIRATORY_TRACT
  Filled 2017-12-26 (×6): qty 2

## 2017-12-26 MED ORDER — MORPHINE SULFATE (PF) 2 MG/ML IV SOLN
1.0000 mg | INTRAVENOUS | Status: DC | PRN
  Administered 2017-12-27: 2 mg via INTRAVENOUS
  Filled 2017-12-26: qty 1

## 2017-12-26 MED ORDER — ONDANSETRON HCL 4 MG PO TABS: 4.0000 mg | ORAL_TABLET | Freq: Four times a day (QID) | ORAL | Status: DC | PRN

## 2017-12-26 MED ORDER — AMITRIPTYLINE HCL 10 MG PO TABS
10.0000 mg | ORAL_TABLET | Freq: Every day | ORAL | Status: DC
  Administered 2017-12-26 – 2017-12-28 (×3): 10 mg via ORAL
  Filled 2017-12-26 (×3): qty 1

## 2017-12-26 MED ORDER — SERTRALINE HCL 100 MG PO TABS
100.0000 mg | ORAL_TABLET | Freq: Every day | ORAL | Status: DC
  Administered 2017-12-26 – 2017-12-28 (×3): 100 mg via ORAL
  Filled 2017-12-26 (×3): qty 1

## 2017-12-26 MED ORDER — SODIUM CHLORIDE 0.9 % IV SOLN
INTRAVENOUS | Status: DC
  Administered 2017-12-26 – 2017-12-27 (×2): via INTRAVENOUS

## 2017-12-26 MED ORDER — ACETAMINOPHEN 500 MG PO TABS
1000.0000 mg | ORAL_TABLET | Freq: Four times a day (QID) | ORAL | Status: AC
  Administered 2017-12-26 – 2017-12-27 (×3): 1000 mg via ORAL
  Filled 2017-12-26 (×3): qty 2

## 2017-12-26 MED ORDER — HYDROMORPHONE HCL 1 MG/ML IJ SOLN
INTRAMUSCULAR | Status: DC | PRN
  Administered 2017-12-26: 0.5 mg via INTRAVENOUS
  Administered 2017-12-26: 1 mg via INTRAVENOUS

## 2017-12-26 MED ORDER — MENTHOL 3 MG MT LOZG: 1.0000 | LOZENGE | OROMUCOSAL | Status: DC | PRN

## 2017-12-26 MED ORDER — DEXAMETHASONE SODIUM PHOSPHATE 10 MG/ML IJ SOLN
8.0000 mg | Freq: Once | INTRAMUSCULAR | Status: AC
  Administered 2017-12-26: 8 mg via INTRAVENOUS

## 2017-12-26 MED ORDER — OXYCODONE HCL 5 MG PO TABS: 5.0000 mg | ORAL_TABLET | Freq: Once | ORAL | Status: DC | PRN

## 2017-12-26 MED ORDER — ACETAMINOPHEN 160 MG/5ML PO SOLN: 1000.0000 mg | Freq: Once | ORAL | Status: DC | PRN

## 2017-12-26 MED ORDER — ONDANSETRON HCL 4 MG/2ML IJ SOLN
INTRAMUSCULAR | Status: AC
  Filled 2017-12-26: qty 2

## 2017-12-26 MED ORDER — FLUTICASONE PROPIONATE 50 MCG/ACT NA SUSP
2.0000 | Freq: Every day | NASAL | Status: DC
  Administered 2017-12-27 – 2017-12-29 (×3): 2 via NASAL
  Filled 2017-12-26: qty 16

## 2017-12-26 MED ORDER — LIP MEDEX EX OINT
TOPICAL_OINTMENT | CUTANEOUS | Status: AC
  Filled 2017-12-26: qty 7

## 2017-12-26 MED ORDER — HYDROXYZINE HCL 25 MG PO TABS
25.0000 mg | ORAL_TABLET | Freq: Two times a day (BID) | ORAL | Status: DC
  Administered 2017-12-26 – 2017-12-29 (×6): 25 mg via ORAL
  Filled 2017-12-26 (×6): qty 1

## 2017-12-26 MED ORDER — LEVOTHYROXINE SODIUM 50 MCG PO TABS
50.0000 ug | ORAL_TABLET | Freq: Every day | ORAL | Status: DC
  Administered 2017-12-27 – 2017-12-29 (×3): 50 ug via ORAL
  Filled 2017-12-26 (×3): qty 1

## 2017-12-26 MED ORDER — MONTELUKAST SODIUM 10 MG PO TABS
10.0000 mg | ORAL_TABLET | Freq: Every day | ORAL | Status: DC
  Administered 2017-12-26 – 2017-12-28 (×3): 10 mg via ORAL
  Filled 2017-12-26 (×3): qty 1

## 2017-12-26 MED ORDER — PRIMIDONE 50 MG PO TABS
50.0000 mg | ORAL_TABLET | Freq: Two times a day (BID) | ORAL | Status: DC
  Administered 2017-12-26 – 2017-12-29 (×6): 50 mg via ORAL
  Filled 2017-12-26 (×6): qty 1

## 2017-12-26 MED ORDER — FENTANYL CITRATE (PF) 100 MCG/2ML IJ SOLN
INTRAMUSCULAR | Status: AC
  Administered 2017-12-26: 25 ug via INTRAVENOUS
  Filled 2017-12-26: qty 2

## 2017-12-26 MED ORDER — KETAMINE HCL 10 MG/ML IJ SOLN
INTRAMUSCULAR | Status: DC | PRN
  Administered 2017-12-26: 10 mg via INTRAVENOUS

## 2017-12-26 MED ORDER — FENTANYL CITRATE (PF) 100 MCG/2ML IJ SOLN
INTRAMUSCULAR | Status: AC
  Filled 2017-12-26: qty 2

## 2017-12-26 SURGICAL SUPPLY — 62 items
ATTUNE PS FEM LT SZ 5 CEM KNEE (Femur) ×1 IMPLANT
ATTUNE PSRP INSR SZ5 8 KNEE (Insert) ×1 IMPLANT
BAG SPEC THK2 15X12 ZIP CLS (MISCELLANEOUS) ×1
BAG ZIPLOCK 12X15 (MISCELLANEOUS) ×2 IMPLANT
BANDAGE ACE 6X5 VEL STRL LF (GAUZE/BANDAGES/DRESSINGS) ×2 IMPLANT
BASEPLATE TIBIAL ROTATING SZ 4 (Knees) ×1 IMPLANT
BLADE SAG 18X100X1.27 (BLADE) ×2 IMPLANT
BLADE SAW SGTL 11.0X1.19X90.0M (BLADE) ×2 IMPLANT
BOWL SMART MIX CTS (DISPOSABLE) ×2 IMPLANT
BSPLAT TIB 4 CMNT ROT PLAT STR (Knees) ×1 IMPLANT
CATH FOLEY 2WAY SLVR  5CC 14FR (CATHETERS) ×1
CATH FOLEY 2WAY SLVR 5CC 14FR (CATHETERS) IMPLANT
CEMENT HV SMART SET (Cement) ×4 IMPLANT
COVER SURGICAL LIGHT HANDLE (MISCELLANEOUS) ×2 IMPLANT
COVER WAND RF STERILE (DRAPES) ×1 IMPLANT
CUFF TOURN SGL QUICK 34 (TOURNIQUET CUFF) ×2
CUFF TRNQT CYL 34X4X40X1 (TOURNIQUET CUFF) ×1 IMPLANT
DECANTER SPIKE VIAL GLASS SM (MISCELLANEOUS) ×1 IMPLANT
DRAPE U-SHAPE 47X51 STRL (DRAPES) ×2 IMPLANT
DRSG ADAPTIC 3X8 NADH LF (GAUZE/BANDAGES/DRESSINGS) ×1 IMPLANT
DRSG EMULSION OIL 3X3 NADH (GAUZE/BANDAGES/DRESSINGS) ×1 IMPLANT
DRSG PAD ABDOMINAL 8X10 ST (GAUZE/BANDAGES/DRESSINGS) ×1 IMPLANT
DURAPREP 26ML APPLICATOR (WOUND CARE) ×2 IMPLANT
ELECT REM PT RETURN 15FT ADLT (MISCELLANEOUS) ×2 IMPLANT
EVACUATOR 1/8 PVC DRAIN (DRAIN) ×2 IMPLANT
GAUZE SPONGE 4X4 12PLY STRL (GAUZE/BANDAGES/DRESSINGS) ×2 IMPLANT
GLOVE BIO SURGEON STRL SZ7 (GLOVE) ×2 IMPLANT
GLOVE BIO SURGEON STRL SZ8 (GLOVE) ×2 IMPLANT
GLOVE BIOGEL PI IND STRL 6.5 (GLOVE) ×1 IMPLANT
GLOVE BIOGEL PI IND STRL 7.0 (GLOVE) ×1 IMPLANT
GLOVE BIOGEL PI IND STRL 8 (GLOVE) ×1 IMPLANT
GLOVE BIOGEL PI INDICATOR 6.5 (GLOVE)
GLOVE BIOGEL PI INDICATOR 7.0 (GLOVE) ×1
GLOVE BIOGEL PI INDICATOR 8 (GLOVE) ×1
GLOVE SURG SS PI 6.5 STRL IVOR (GLOVE) ×1 IMPLANT
GOWN STRL REUS W/TWL LRG LVL3 (GOWN DISPOSABLE) ×4 IMPLANT
GOWN STRL REUS W/TWL XL LVL3 (GOWN DISPOSABLE) ×3 IMPLANT
HANDPIECE INTERPULSE COAX TIP (DISPOSABLE) ×2
HOLDER FOLEY CATH W/STRAP (MISCELLANEOUS) IMPLANT
IMMOBILIZER KNEE 20 (SOFTGOODS) ×2
IMMOBILIZER KNEE 20 THIGH 36 (SOFTGOODS) ×1 IMPLANT
MANIFOLD NEPTUNE II (INSTRUMENTS) ×2 IMPLANT
NS IRRIG 1000ML POUR BTL (IV SOLUTION) ×1 IMPLANT
PACK TOTAL KNEE CUSTOM (KITS) ×2 IMPLANT
PAD ABD 8X10 STRL (GAUZE/BANDAGES/DRESSINGS) ×1 IMPLANT
PADDING CAST COTTON 6X4 STRL (CAST SUPPLIES) ×6 IMPLANT
PATELLA MEDIAL ATTUN 35MM KNEE (Knees) ×1 IMPLANT
PIN STEINMAN FIXATION KNEE (PIN) ×1 IMPLANT
PIN THREADED HEADED SIGMA (PIN) ×1 IMPLANT
PROTECTOR NERVE ULNAR (MISCELLANEOUS) ×2 IMPLANT
SET HNDPC FAN SPRY TIP SCT (DISPOSABLE) ×1 IMPLANT
STRIP CLOSURE SKIN 1/2X4 (GAUZE/BANDAGES/DRESSINGS) ×4 IMPLANT
SUT MNCRL AB 4-0 PS2 18 (SUTURE) ×2 IMPLANT
SUT STRATAFIX 0 PDS 27 VIOLET (SUTURE) ×2
SUT VIC AB 2-0 CT1 27 (SUTURE) ×6
SUT VIC AB 2-0 CT1 TAPERPNT 27 (SUTURE) ×3 IMPLANT
SUTURE STRATFX 0 PDS 27 VIOLET (SUTURE) ×1 IMPLANT
TRAY FOLEY CATH 14FR (SET/KITS/TRAYS/PACK) ×1 IMPLANT
TRAY FOLEY MTR SLVR 16FR STAT (SET/KITS/TRAYS/PACK) ×1 IMPLANT
WATER STERILE IRR 1000ML POUR (IV SOLUTION) ×4 IMPLANT
WRAP KNEE MAXI GEL POST OP (GAUZE/BANDAGES/DRESSINGS) ×2 IMPLANT
YANKAUER SUCT BULB TIP 10FT TU (MISCELLANEOUS) ×2 IMPLANT

## 2017-12-26 NOTE — Op Note (Addendum)
OPERATIVE REPORT-TOTAL KNEE ARTHROPLASTY   Pre-operative diagnosis- Osteoarthritis  Left knee(s)  Post-operative diagnosis- Osteoarthritis Left knee(s)  Procedure-  Left  Total Knee Arthroplasty (Depuy Attune)  Surgeon- Dione Plover. Keyron Pokorski, MD  Assistant- Theresa Duty, PA-C   Anesthesia-  General  EBL- 25 ml   Drains Hemovac  Tourniquet time- 32 minutes @ 027 mm Hg    Complications- None  Condition-PACU - hemodynamically stable.   Brief Clinical Note  [name] is a 62 y.o. year old female with end stage OA of her left knee with progressively worsening pain and dysfunction. She has constant pain, with activity and at rest and significant functional deficits with difficulties even with ADLs. She has had extensive non-op management including analgesics, injections of cortisone and viscosupplements, and home exercise program, but remains in significant pain with significant dysfunction. Radiographs show bone on bone arthritis medial and patellofemoral. She presents now for left Total Knee Arthroplasty.    Procedure in detail---   The patient is brought into the operating room and positioned supine on the operating table. After successful administration of  General,   a tourniquet is placed high on the  Left thigh(s) and the lower extremity is prepped and draped in the usual sterile fashion. Time out is performed by the operating team and then the  Left lower extremity is wrapped in Esmarch, knee flexed and the tourniquet inflated to 300 mmHg.       A midline incision is made with a ten blade through the subcutaneous tissue to the level of the extensor mechanism. A fresh blade is used to make a medial parapatellar arthrotomy. Soft tissue over the proximal medial tibia is subperiosteally elevated to the joint line with a knife and into the semimembranosus bursa with a Cobb elevator. Soft tissue over the proximal lateral tibia is elevated with attention being paid to avoiding the  patellar tendon on the tibial tubercle. The patella is everted, knee flexed 90 degrees and the ACL and PCL are removed. Findings are bone on bone medial and patellofemoral with massive global osteophytes.        The drill is used to create a starting hole in the distal femur and the canal is thoroughly irrigated with sterile saline to remove the fatty contents. The 5 degree Left  valgus alignment guide is placed into the femoral canal and the distal femoral cutting block is pinned to remove 9 mm off the distal femur. Resection is made with an oscillating saw.      The tibia is subluxed forward and the menisci are removed. The extramedullary alignment guide is placed referencing proximally at the medial aspect of the tibial tubercle and distally along the second metatarsal axis and tibial crest. The block is pinned to remove 73mm off the more deficient medial  side. Resection is made with an oscillating saw. Size 4is the most appropriate size for the tibia and the proximal tibia is prepared with the modular drill and keel punch for that size.      The femoral sizing guide is placed and size 5 is most appropriate. Rotation is marked off the epicondylar axis and confirmed by creating a rectangular flexion gap at 90 degrees. The size 5 cutting block is pinned in this rotation and the anterior, posterior and chamfer cuts are made with the oscillating saw. The intercondylar block is then placed and that cut is made.      Trial size 4 tibial component, trial size 5 posterior stabilized femur and  a 8  mm posterior stabilized rotating platform insert trial is placed. Full extension is achieved with excellent varus/valgus and anterior/posterior balance throughout full range of motion. The patella is everted and thickness measured to be 22  mm. Free hand resection is taken to 12 mm, a 35 template is placed, lug holes are drilled, trial patella is placed, and it tracks normally. Osteophytes are removed off the posterior  femur with the trial in place. All trials are removed and the cut bone surfaces prepared with pulsatile lavage. Cement is mixed and once ready for implantation, the size 4 tibial implant, size  5 posterior stabilized femoral component, and the size 35 patella are cemented in place and the patella is held with the clamp. The trial insert is placed and the knee held in full extension. All extruded cement is removed and once the cement is hard the permanent 8 mm posterior stabilized rotating platform insert is placed into the tibial tray.      The wound is copiously irrigated with saline solution and the extensor mechanism closed over a hemovac drain with #1 V-loc suture. The tourniquet is released for a total tourniquet time of 32  minutes. Flexion against gravity is 140 degrees and the patella tracks normally. Subcutaneous tissue is closed with 2.0 vicryl and subcuticular with running 4.0 Monocryl. The incision is cleaned and dried and steri-strips and a bulky sterile dressing are applied. The limb is placed into a knee immobilizer and the patient is awakened and transported to recovery in stable condition.      Please note that a surgical assistant was a medical necessity for this procedure in order to perform it in a safe and expeditious manner. Surgical assistant was necessary to retract the ligaments and vital neurovascular structures to prevent injury to them and also necessary for proper positioning of the limb to allow for anatomic placement of the prosthesis.   Dione Plover Diane Dascoli, MD    12/26/2017, 9:32 AM

## 2017-12-26 NOTE — Evaluation (Signed)
Physical Therapy Evaluation Patient Details Name: [name] MRN: [id number] DOB: 1955-09-03 Today's Date: 12/26/2017   History of Present Illness  62 yo female s/p L TKA. Hx of obesity, hernia repair 2018, bipolar d/o  Clinical Impression  On eval POD 0, pt required Min assist +2 for mobility. She was able to perform a stand pivot and take a few steps with the RW. Moderate pain with activity. Pt c/o nausea during session. She was a bit drowsy still. Will follow and progress activity as tolerated. D/C plan is for home with OP PT f/u.     Follow Up Recommendations Follow surgeon's recommendation for DC plan and follow-up therapies; 24 hour supervision/assist     Equipment Recommendations  Rolling walker with 5" wheels(youth height)    Recommendations for Other Services       Precautions / Restrictions Precautions Precautions: Fall;Knee Required Braces or Orthoses: Knee Immobilizer - Left Knee Immobilizer - Left: Discontinue once straight leg raise with < 10 degree lag Restrictions Weight Bearing Restrictions: No Other Position/Activity Restrictions: WBAT      Mobility  Bed Mobility Overal bed mobility: Needs Assistance Bed Mobility: Supine to Sit     Supine to sit: Min assist;+2 for physical assistance;+2 for safety/equipment;HOB elevated     General bed mobility comments: Assist for trunk and bil LEs. Increased time. Utilized bedpad for scooting, positioning. Cues for safety, technique.   Transfers Overall transfer level: Needs assistance Equipment used: Rolling walker (2 wheeled) Transfers: Sit to/from [facility] Sit to Stand: Min assist;+2 physical assistance;+2 safety/equipment;From elevated surface Stand pivot transfers: Min assist;+2 physical assistance;+2 safety/equipment       General transfer comment: x 2. VCs safety, technique, hand placement. Increased time. Assist to rise, stabilize, control descent. Stand pivot, bed to bsc, with RW.    Ambulation/Gait Ambulation/Gait assistance: Min assist;+2 physical assistance;+2 safety/equipment Gait Distance (Feet): 3 Feet Assistive device: Rolling walker (2 wheeled) Gait Pattern/deviations: Step-to pattern;Decreased step length - left;Decreased stance time - right;Antalgic;Trunk flexed     General Gait Details: Assist to rise, stabilize, control descent. Increased time. VCs safety, technique, sequence. Pt c/o nausea  Stairs            Wheelchair Mobility    Modified Rankin (Stroke Patients Only)       Balance Overall balance assessment: Needs assistance         Standing balance support: Bilateral upper extremity supported Standing balance-Leahy Scale: Poor                               Pertinent Vitals/Pain Pain Assessment: 0-10 Pain Score: 6  Pain Location: L knee Pain Descriptors / Indicators: Aching;Sore Pain Intervention(s): Limited activity within patient's tolerance;Ice applied;Repositioned    Home Living Family/patient expects to be discharged to:: Private residence Living Arrangements: Alone(planning to d/c to friend's home) Available Help at Discharge: Friend(s) Type of Home: House Home Access: Stairs to enter Entrance Stairs-Rails: None Entrance Stairs-Number of Steps: 2 Home Layout: One level Home Equipment: Cane - single point      Prior Function Level of Independence: Independent               Hand Dominance        Extremity/Trunk Assessment   Upper Extremity Assessment Upper Extremity Assessment: Generalized weakness    Lower Extremity Assessment Lower Extremity Assessment: Generalized weakness    Cervical / Trunk Assessment Cervical / Trunk Assessment: Kyphotic  Communication  Communication: No difficulties  Cognition Arousal/Alertness: Awake/alert(drowsy) Behavior During Therapy: WFL for tasks assessed/performed Overall Cognitive Status: Within Functional Limits for tasks assessed                                         General Comments      Exercises Total Joint Exercises Quad Sets: AROM;5 reps;Seated;Left   Assessment/Plan    PT Assessment Patient needs continued PT services  PT Problem List Decreased strength;Decreased range of motion;Decreased balance;Decreased mobility;Decreased knowledge of precautions;Pain;Decreased knowledge of use of DME;Decreased activity tolerance       PT Treatment Interventions DME instruction;Gait training;Functional mobility training;Therapeutic activities;Balance training;Patient/family education;Therapeutic exercise;Stair training    PT Goals (Current goals can be found in the Care Plan section)  Acute Rehab PT Goals Patient Stated Goal: less pain. to be able to d/c home PT Goal Formulation: With patient Time For Goal Achievement: 01/09/18 Potential to Achieve Goals: Good    Frequency 7X/week   Barriers to discharge        Co-evaluation               AM-PAC PT "6 Clicks" Daily Activity  Outcome Measure Difficulty turning over in bed (including adjusting bedclothes, sheets and blankets)?: Unable Difficulty moving from lying on back to sitting on the side of the bed? : Unable Difficulty sitting down on and standing up from a chair with arms (e.g., wheelchair, bedside commode, etc,.)?: Unable Help needed moving to and from a bed to chair (including a wheelchair)?: A Lot Help needed walking in hospital room?: A Lot Help needed climbing 3-5 steps with a railing? : Total 6 Click Score: 8    End of Session Equipment Utilized During Treatment: Gait belt Activity Tolerance: Patient limited by fatigue;Patient limited by pain Patient left: in chair;with call bell/phone within reach   PT Visit Diagnosis: Pain;Difficulty in walking, not elsewhere classified (R26.2);Other abnormalities of gait and mobility (R26.89) Pain - Right/Left: Left    Time: 0254-2706 PT Time Calculation (min) (ACUTE ONLY): 28  min   Charges:   PT Evaluation $PT Eval Low Complexity: 1 Low PT Treatments $Therapeutic Activity: 8-22 mins          Weston Anna, PT Acute Rehabilitation Services Pager: [phone] Office: [phone]

## 2017-12-26 NOTE — Anesthesia Postprocedure Evaluation (Signed)
Anesthesia Post Note  Patient: [name]  Procedure(s) Performed: LEFT TOTAL KNEE ARTHROPLASTY (Left Knee)     Patient location during evaluation: PACU Anesthesia Type: General Level of consciousness: awake and alert Pain management: pain level controlled Vital Signs Assessment: post-procedure vital signs reviewed and stable Respiratory status: spontaneous breathing, nonlabored ventilation, respiratory function stable and patient connected to nasal cannula oxygen Cardiovascular status: blood pressure returned to baseline and stable Postop Assessment: no apparent nausea or vomiting Anesthetic complications: no    Last Vitals:  Vitals:   12/26/17 1314 12/26/17 1421  BP: (!) 146/65 (!) 144/80  Pulse: (!) 117 (!) 102  Resp:  16  Temp: 36.9 C 36.4 C  SpO2: 99% 98%    Last Pain:  Vitals:   12/26/17 1417  TempSrc:   PainSc: 5                  Amarys Sliwinski

## 2017-12-26 NOTE — Transfer of Care (Signed)
Immediate Anesthesia Transfer of Care Note  Patient: [name]  Procedure(s) Performed: LEFT TOTAL KNEE ARTHROPLASTY (Left Knee)  Patient Location: PACU  Anesthesia Type:General  Level of Consciousness: awake, alert  and oriented  Airway & Oxygen Therapy: Patient Spontanous Breathing and Patient connected to face mask oxygen  Post-op Assessment: Report given to RN and Post -op Vital signs reviewed and stable  Post vital signs: Reviewed and stable  Last Vitals:  Vitals Value Taken Time  BP 147/79 12/26/2017 10:05 AM  Temp    Pulse 70 12/26/2017 10:08 AM  Resp 10 12/26/2017 10:08 AM  SpO2 98 % 12/26/2017 10:08 AM  Vitals shown include unvalidated device data.  Last Pain:  Vitals:   12/26/17 0641  TempSrc: Oral         Complications: No apparent anesthesia complications

## 2017-12-26 NOTE — Anesthesia Procedure Notes (Signed)
Procedure Name: LMA Insertion Date/Time: 12/26/2017 8:33 AM Performed by: Garrel Ridgel, CRNA Pre-anesthesia Checklist: Emergency Drugs available, Patient identified, Suction available, Patient being monitored and Timeout performed Patient Re-evaluated:Patient Re-evaluated prior to induction Oxygen Delivery Method: Circle system utilized Preoxygenation: Pre-oxygenation with 100% oxygen Induction Type: IV induction Ventilation: Mask ventilation without difficulty LMA: LMA inserted LMA Size: 3.0 Number of attempts: 1 Placement Confirmation: positive ETCO2 Dental Injury: Teeth and Oropharynx as per pre-operative assessment

## 2017-12-26 NOTE — Care Plan (Signed)
Ortho Bundle Case Management Note  Patient Details  Name: [name] MRN: [id number] Date of Birth: Jan 29, 1956  L TKA scheduled on 12-26-17 DCP:  Home with friend.  1 story home with 1 ste.  DME:  RW and 3-in-1 ordered through [facility]. PT:  EO.  PT eval scheduled on 12-30-17.   DME Arranged:  Berta Minor DME Agency:  [facility]  HH Arranged:  NA HH Agency:  NA  Additional Comments: Please contact me with any questions of if this plan should need to change.  Marianne Sofia, RN,CCM [facility]  [phone] 12/26/2017, 10:00 AM

## 2017-12-26 NOTE — Plan of Care (Signed)
Plan of care 

## 2017-12-26 NOTE — Anesthesia Preprocedure Evaluation (Addendum)
Anesthesia Evaluation  Patient identified by MRN, date of birth, ID band Patient awake    Reviewed: Allergy & Precautions, NPO status , Patient's Chart, lab work & pertinent test results  History of Anesthesia Complications (+) PONV and history of anesthetic complications  Airway Mallampati: IV  TM Distance: <3 FB Neck ROM: Full  Mouth opening: Limited Mouth Opening  Dental  (+) Teeth Intact,    Pulmonary asthma , sleep apnea ,    breath sounds clear to auscultation       Cardiovascular hypertension,  Rhythm:Irregular     Neuro/Psych  Headaches, PSYCHIATRIC DISORDERS Anxiety Depression Bipolar Disorder    GI/Hepatic hiatal hernia, GERD  ,Elevated enzymes    Endo/Other  Hypothyroidism   Renal/GU      Musculoskeletal  (+) Arthritis ,   Abdominal   Peds  Hematology negative hematology ROS (+)   Anesthesia Other Findings 2019 tte: Left ventricle: The cavity size was normal. Wall thickness was   normal. Systolic function was normal. The estimated ejection   fraction was in the range of 55% to 60%. Wall motion was normal;   there were no regional wall motion abnormalities. Doppler   parameters are consistent with abnormal left ventricular   relaxation (grade 1 diastolic dysfunction).  Reproductive/Obstetrics                            Anesthesia Physical Anesthesia Plan  ASA: III  Anesthesia Plan: General   Post-op Pain Management:    Induction: Intravenous  PONV Risk Score and Plan: 4 or greater and Ondansetron, Dexamethasone, Propofol infusion, TIVA, Midazolam and Scopolamine patch - Pre-op  Airway Management Planned: LMA  Additional Equipment: None  Intra-op Plan:   Post-operative Plan:   Informed Consent: I have reviewed the patients History and Physical, chart, labs and discussed the procedure including the risks, benefits and alternatives for the proposed anesthesia with  the patient or authorized representative who has indicated his/her understanding and acceptance.   Dental advisory given  Plan Discussed with: CRNA and Surgeon  Anesthesia Plan Comments:         Anesthesia Quick Evaluation

## 2017-12-26 NOTE — Interval H&P Note (Signed)
History and Physical Interval Note:  12/26/2017 6:48 AM  [name]  has presented today for surgery, with the diagnosis of left knee osteoarthritis  The various methods of treatment have been discussed with the patient and family. After consideration of risks, benefits and other options for treatment, the patient has consented to  Procedure(s) with comments: LEFT TOTAL KNEE ARTHROPLASTY (Left) - 30min as a surgical intervention .  The patient's history has been reviewed, patient examined, no change in status, stable for surgery.  I have reviewed the patient's chart and labs.  Questions were answered to the patient's satisfaction.     Pilar Plate Jorie Zee

## 2017-12-26 NOTE — Discharge Instructions (Addendum)
° °Dr. Frank Aluisio °Total Joint Specialist °[facility] °[address] °[place], [place] [address] °[phone] ° °TOTAL KNEE REPLACEMENT POSTOPERATIVE DIRECTIONS ° °Knee Rehabilitation, Guidelines Following Surgery  °Results after knee surgery are often greatly improved when you follow the exercise, range of motion and muscle strengthening exercises prescribed by your doctor. Safety measures are also important to protect the knee from further injury. Any time any of these exercises cause you to have increased pain or swelling in your knee joint, decrease the amount until you are comfortable again and slowly increase them. If you have problems or questions, call your caregiver or physical therapist for advice.  ° °HOME CARE INSTRUCTIONS  °• Remove items at home which could result in a fall. This includes throw rugs or furniture in walking pathways.  °· ICE to the affected knee every three hours for 30 minutes at a time and then as needed for pain and swelling.  Continue to use ice on the knee for pain and swelling from surgery. You may notice swelling that will progress down to the foot and ankle.  This is normal after surgery.  Elevate the leg when you are not up walking on it.   °· Continue to use the breathing machine which will help keep your temperature down.  It is common for your temperature to cycle up and down following surgery, especially at night when you are not up moving around and exerting yourself.  The breathing machine keeps your lungs expanded and your temperature down. °· Do not place pillow under knee, focus on keeping the knee straight while resting ° °DIET °You may resume your previous home diet once your are discharged from the hospital. ° °DRESSING / WOUND CARE / SHOWERING °You may shower 3 days after surgery, but keep the wounds dry during showering.  You may use an occlusive plastic wrap (Press'n Seal for example), NO SOAKING/SUBMERGING IN THE BATHTUB.  If the bandage  gets wet, change with a clean dry gauze.  If the incision gets wet, pat the wound dry with a clean towel. °You may start showering once you are discharged home but do not submerge the incision under water. Just pat the incision dry and apply a dry gauze dressing on daily. °Change the surgical dressing daily and reapply a dry dressing each time. ° °ACTIVITY °Walk with your walker as instructed. °Use walker as long as suggested by your caregivers. °Avoid periods of inactivity such as sitting longer than an hour when not asleep. This helps prevent blood clots.  °You may resume a sexual relationship in one month or when given the OK by your doctor.  °You may return to work once you are cleared by your doctor.  °Do not drive a car for 6 weeks or until released by you surgeon.  °Do not drive while taking narcotics. ° °WEIGHT BEARING °Weight bearing as tolerated with assist device (walker, cane, etc) as directed, use it as long as suggested by your surgeon or therapist, typically at least 4-6 weeks. ° °POSTOPERATIVE CONSTIPATION PROTOCOL °Constipation - defined medically as fewer than three stools per week and severe constipation as less than one stool per week. ° °One of the most common issues patients have following surgery is constipation.  Even if you have a regular bowel pattern at home, your normal regimen is likely to be disrupted due to multiple reasons following surgery.  Combination of anesthesia, postoperative narcotics, change in appetite and fluid intake all can affect your bowels.    In order to avoid complications following surgery, here are some recommendations in order to help you during your recovery period. ° °Colace (docusate) - Pick up an over-the-counter form of Colace or another stool softener and take twice a day as long as you are requiring postoperative pain medications.  Take with a full glass of water daily.  If you experience loose stools or diarrhea, hold the colace until you stool forms back  up.  If your symptoms do not get better within 1 week or if they get worse, check with your doctor. ° °Dulcolax (bisacodyl) - Pick up over-the-counter and take as directed by the product packaging as needed to assist with the movement of your bowels.  Take with a full glass of water.  Use this product as needed if not relieved by Colace only.  ° °MiraLax (polyethylene glycol) - Pick up over-the-counter to have on hand.  MiraLax is a solution that will increase the amount of water in your bowels to assist with bowel movements.  Take as directed and can mix with a glass of water, juice, soda, coffee, or tea.  Take if you go more than two days without a movement. °Do not use MiraLax more than once per day. Call your doctor if you are still constipated or irregular after using this medication for 7 days in a row. ° °If you continue to have problems with postoperative constipation, please contact the office for further assistance and recommendations.  If you experience "the worst abdominal pain ever" or develop nausea or vomiting, please contact the office immediatly for further recommendations for treatment. ° °ITCHING ° If you experience itching with your medications, try taking only a single pain pill, or even half a pain pill at a time.  You can also use Benadryl over the counter for itching or also to help with sleep.  ° °TED HOSE STOCKINGS °Wear the elastic stockings on both legs for three weeks following surgery during the day but you may remove then at night for sleeping. ° °MEDICATIONS °See your medication summary on the “After Visit Summary” that the nursing staff will review with you prior to discharge.  You may have some home medications which will be placed on hold until you complete the course of blood thinner medication.  It is important for you to complete the blood thinner medication as prescribed by your surgeon.  Continue your approved medications as instructed at time of discharge. ° °PRECAUTIONS °If  you experience chest pain or shortness of breath - call 911 immediately for transfer to the hospital emergency department.  °If you develop a fever greater that 101 F, purulent drainage from wound, increased redness or drainage from wound, foul odor from the wound/dressing, or calf pain - CONTACT YOUR SURGEON.   °                                                °FOLLOW-UP APPOINTMENTS °Make sure you keep all of your appointments after your operation with your surgeon and caregivers. You should call the office at the above phone number and make an appointment for approximately two weeks after the date of your surgery or on the date instructed by your surgeon outlined in the "After Visit Summary". ° ° °RANGE OF MOTION AND STRENGTHENING EXERCISES  °Rehabilitation of the knee is important following a knee injury or   an operation. After just a few days of immobilization, the muscles of the thigh which control the knee become weakened and shrink (atrophy). Knee exercises are designed to build up the tone and strength of the thigh muscles and to improve knee motion. Often times heat used for twenty to thirty minutes before working out will loosen up your tissues and help with improving the range of motion but do not use heat for the first two weeks following surgery. These exercises can be done on a training (exercise) mat, on the floor, on a table or on a bed. Use what ever works the best and is most comfortable for you Knee exercises include:  °• Leg Lifts - While your knee is still immobilized in a splint or cast, you can do straight leg raises. Lift the leg to 60 degrees, hold for 3 sec, and slowly lower the leg. Repeat 10-20 times 2-3 times daily. Perform this exercise against resistance later as your knee gets better.  °• Quad and Hamstring Sets - Tighten up the muscle on the front of the thigh (Quad) and hold for 5-10 sec. Repeat this 10-20 times hourly. Hamstring sets are done by pushing the foot backward against an  object and holding for 5-10 sec. Repeat as with quad sets.  °· Leg Slides: Lying on your back, slowly slide your foot toward your buttocks, bending your knee up off the floor (only go as far as is comfortable). Then slowly slide your foot back down until your leg is flat on the floor again. °· Angel Wings: Lying on your back spread your legs to the side as far apart as you can without causing discomfort.  °A rehabilitation program following serious knee injuries can speed recovery and prevent re-injury in the future due to weakened muscles. Contact your doctor or a physical therapist for more information on knee rehabilitation.  ° °IF YOU ARE TRANSFERRED TO A SKILLED REHAB FACILITY °If the patient is transferred to a skilled rehab facility following release from the hospital, a list of the current medications will be sent to the facility for the patient to continue.  When discharged from the skilled rehab facility, please have the facility set up the patient's Home Health Physical Therapy prior to being released. Also, the skilled facility will be responsible for providing the patient with their medications at time of release from the facility to include their pain medication, the muscle relaxants, and their blood thinner medication. If the patient is still at the rehab facility at time of the two week follow up appointment, the skilled rehab facility will also need to assist the patient in arranging follow up appointment in our office and any transportation needs. ° °MAKE SURE YOU:  °• Understand these instructions.  °• Get help right away if you are not doing well or get worse.  ° ° °Pick up stool softner and laxative for home use following surgery while on pain medications. °Do not submerge incision under water. °Please use good hand washing techniques while changing dressing each day. °May shower starting three days after surgery. °Please use a clean towel to pat the incision dry following showers. °Continue to  use ice for pain and swelling after surgery. °Do not use any lotions or creams on the incision until instructed by your surgeon. ° °Information on my medicine - XARELTO® (Rivaroxaban) ° °This medication education was reviewed with me or my healthcare representative as part of my discharge preparation.  The pharmacist that spoke with me   during my hospital stay was:   ° °Why was Xarelto® prescribed for you? °Xarelto® was prescribed for you to reduce the risk of blood clots forming after orthopedic surgery. The medical term for these abnormal blood clots is venous thromboembolism (VTE). ° °What do you need to know about xarelto® ? °Take your Xarelto® ONCE DAILY at the same time every day. °You may take it either with or without food. ° °If you have difficulty swallowing the tablet whole, you may crush it and mix in applesauce just prior to taking your dose. ° °Take Xarelto® exactly as prescribed by your doctor and DO NOT stop taking Xarelto® without talking to the doctor who prescribed the medication.  Stopping without other VTE prevention medication to take the place of Xarelto® may increase your risk of developing a clot. ° °After discharge, you should have regular check-up appointments with your healthcare provider that is prescribing your Xarelto®.   ° °What do you do if you miss a dose? °If you miss a dose, take it as soon as you remember on the same day then continue your regularly scheduled once daily regimen the next day. Do not take two doses of Xarelto® on the same day.  ° °Important Safety Information °A possible side effect of Xarelto® is bleeding. You should call your healthcare provider right away if you experience any of the following: °? Bleeding from an injury or your nose that does not stop. °? Unusual colored urine (red or dark brown) or unusual colored stools (red or black). °? Unusual bruising for unknown reasons. °? A serious fall or if you hit your head (even if there is no bleeding). ° °Some  medicines may interact with Xarelto® and might increase your risk of bleeding while on Xarelto®. To help avoid this, consult your healthcare provider or pharmacist prior to using any new prescription or non-prescription medications, including herbals, vitamins, non-steroidal anti-inflammatory drugs (NSAIDs) and supplements. ° °This website has more information on Xarelto®: www.xarelto.com. ° ° ° ° °

## 2017-12-27 ENCOUNTER — Other Ambulatory Visit: Payer: Self-pay | Admitting: Neurology

## 2017-12-27 DIAGNOSIS — Z96652 Presence of left artificial knee joint: Secondary | ICD-10-CM | POA: Diagnosis not present

## 2017-12-27 DIAGNOSIS — M1712 Unilateral primary osteoarthritis, left knee: Secondary | ICD-10-CM | POA: Diagnosis not present

## 2017-12-27 LAB — BASIC METABOLIC PANEL
Anion gap: 11 (ref 5–15)
BUN: 11 mg/dL (ref 8–23)
CALCIUM: 9.1 mg/dL (ref 8.9–10.3)
CHLORIDE: 102 mmol/L (ref 98–111)
CO2: 27 mmol/L (ref 22–32)
CREATININE: 0.95 mg/dL (ref 0.44–1.00)
Glucose, Bld: 154 mg/dL — ABNORMAL HIGH (ref 70–99)
Potassium: 3.3 mmol/L — ABNORMAL LOW (ref 3.5–5.1)
SODIUM: 140 mmol/L (ref 135–145)

## 2017-12-27 LAB — CBC
HCT: 35.6 % — ABNORMAL LOW (ref 36.0–46.0)
HEMOGLOBIN: 11.3 g/dL — AB (ref 12.0–15.0)
MCH: 30.8 pg (ref 26.0–34.0)
MCHC: 31.7 g/dL (ref 30.0–36.0)
MCV: 97 fL (ref 80.0–100.0)
Platelets: 190 10*3/uL (ref 150–400)
RBC: 3.67 MIL/uL — ABNORMAL LOW (ref 3.87–5.11)
RDW: 13.3 % (ref 11.5–15.5)
WBC: 13.8 10*3/uL — AB (ref 4.0–10.5)
nRBC: 0 % (ref 0.0–0.2)

## 2017-12-27 MED ORDER — POTASSIUM CHLORIDE CRYS ER 20 MEQ PO TBCR
40.0000 meq | EXTENDED_RELEASE_TABLET | Freq: Once | ORAL | Status: AC
  Administered 2017-12-27: 40 meq via ORAL
  Filled 2017-12-27: qty 2

## 2017-12-27 MED ORDER — METHOCARBAMOL 500 MG PO TABS: 500.0000 mg | ORAL_TABLET | Freq: Four times a day (QID) | ORAL | 0 refills | Status: DC | PRN

## 2017-12-27 MED ORDER — OXYCODONE HCL 5 MG PO TABS: 5.0000 mg | ORAL_TABLET | Freq: Four times a day (QID) | ORAL | 0 refills | Status: DC | PRN

## 2017-12-27 MED ORDER — RIVAROXABAN 10 MG PO TABS: 10.0000 mg | ORAL_TABLET | Freq: Every day | ORAL | 0 refills | Status: DC

## 2017-12-27 NOTE — Progress Notes (Signed)
Subjective: 1 Day Post-Op Procedure(s) (LRB): LEFT TOTAL KNEE ARTHROPLASTY (Left) Patient reports pain as moderate.   Patient seen in rounds by Dr. Wynelle Link. Patient is well, and has had no acute complaints or problems other than pain in the left knee. No issues overnight. Denies chest pain, SOB, or calf pain. Voiding without difficulty, positive flatus. We will continue therapy today.   Objective: Vital signs in last 24 hours: Temp:  [97.5 F (36.4 C)-99.6 F (37.6 C)] 99.3 F (37.4 C) (11/19 0519) Pulse Rate:  [90-117] 91 (11/19 0519) Resp:  [12-21] 16 (11/19 0519) BP: (126-159)/(65-98) 135/67 (11/19 0519) SpO2:  [95 %-100 %] 99 % (11/19 0519) Weight:  [68.7 kg] 68.7 kg (11/18 1210)  Intake/Output from previous day:  Intake/Output Summary (Last 24 hours) at 12/27/2017 0708 Last data filed at 12/27/2017 0600 Gross per 24 hour  Intake 3653.45 ml  Output 1270 ml  Net 2383.45 ml    Labs: Recent Labs    12/27/17 0418  HGB 11.3*   Recent Labs    12/27/17 0418  WBC 13.8*  RBC 3.67*  HCT 35.6*  PLT 190   Recent Labs    12/27/17 0418  NA 140  K 3.3*  CL 102  CO2 27  BUN 11  CREATININE 0.95  GLUCOSE 154*  CALCIUM 9.1   Exam: General - Patient is Alert and Oriented Extremity - Neurologically intact Neurovascular intact Sensation intact distally Dorsiflexion/Plantar flexion intact Dressing - dressing C/D/I Motor Function - intact, moving foot and toes well on exam.   Past Medical History:  Diagnosis Date  . Allergic rhinitis   . Anxiety   . Benign essential tremor    hands  . Bipolar 1 disorder, mixed, moderate ([place])   . Claustrophobia   . Depression   . Eczema   . Frequency of urination   . History of frequent urinary tract infections   . History of gastroesophageal reflux (GERD)    05-25-2017  per pt no issues since hiatal hernia repair 01/ 2018  . History of hiatal hernia   . History of melanoma excision    early 2000s-- BACK  . History of  panic attacks   . 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 Hypothyroidism   . Intermittent palpitations    cardiology--  dr Martinique  . Interstitial cystitis   . Limited jaw range of motion    s/p  bilateral TMJ surgery,  age 45s  . Migraines   . Moderate persistent asthma    pulmologist-- dr Halford Chessman  . OA (osteoarthritis)    both knees  . OSA on CPAP    per last sleep study 09/ 2017  mild OSA, AHI13/hr  . PONV (postoperative nausea and vomiting)   . PVC (premature ventricular contraction)   . Rosacea   . Sensation of pressure in bladder area    per pt intermittant  . TMJ arthralgia   . Urticaria   . Wears glasses   . White coat syndrome without hypertension    05-25-2017  per pt hx hypertension yrs ago, no issues after quitting stressful job    Assessment/Plan: 1 Day Post-Op Procedure(s) (LRB): LEFT TOTAL KNEE ARTHROPLASTY (Left) Principal Problem:   OA (osteoarthritis) of knee  Estimated body mass index is 29.56 kg/m as calculated from the following:   Height as of this encounter: 5' (1.524 m).   Weight as of this encounter: 68.7 kg. Advance diet Up with therapy  Anticipated  LOS equal to or greater than 2 midnights due to - Age 78 and older with one or more of the following:  - Obesity  - Expected need for hospital services (PT, OT, Nursing) required for safe  discharge  - Anticipated need for postoperative skilled nursing care or inpatient rehab  - Active co-morbidities: None OR   - Unanticipated findings during/Post Surgery: None  - Patient is a high risk of re-admission due to: None    DVT Prophylaxis - Xarelto Weight bearing as tolerated. D/C O2 and pulse ox and try on room air. Hemovac pulled without difficulty, will continue therapy today.  Potassium low at 3.3 this AM, one dose of 40 mEq KCl ordered. Plan is to go Home after hospital stay. Plan for discharge tomorrow if progresses with therapy and pain under control.  Theresa Duty, PA-C Orthopedic Surgery 12/27/2017, 7:08 AM

## 2017-12-27 NOTE — Plan of Care (Signed)
  Problem: Health Behavior/Discharge Planning: Goal: Ability to manage health-related needs will improve Outcome: Progressing   Problem: Clinical Measurements: Goal: Ability to maintain clinical measurements within normal limits will improve Outcome: Progressing Goal: Will remain free from infection Outcome: Progressing Goal: Diagnostic test results will improve Outcome: Progressing Goal: Respiratory complications will improve Outcome: Progressing Goal: Cardiovascular complication will be avoided Outcome: Progressing   Problem: Activity: Goal: Risk for activity intolerance will decrease Outcome: Progressing   Problem: Nutrition: Goal: Adequate nutrition will be maintained Outcome: Progressing   Problem: Elimination: Goal: Will not experience complications related to bowel motility Outcome: Progressing Goal: Will not experience complications related to urinary retention Outcome: Progressing   Problem: Pain Managment: Goal: General experience of comfort will improve Outcome: Progressing   Problem: Skin Integrity: Goal: Risk for impaired skin integrity will decrease Outcome: Progressing   Problem: Education: Goal: Individualized Educational Video(s) Outcome: Progressing   Problem: Activity: Goal: Ability to avoid complications of mobility impairment will improve Outcome: Progressing Goal: Range of joint motion will improve Outcome: Progressing   Problem: Clinical Measurements: Goal: Postoperative complications will be avoided or minimized Outcome: Progressing   Problem: Pain Management: Goal: Pain level will decrease with appropriate interventions Outcome: Progressing

## 2017-12-27 NOTE — Progress Notes (Signed)
Pattient's O2 sats high 90's on 2 liters nasal cannula. Patient ambulating to bathroom. Vitals stable.

## 2017-12-27 NOTE — Progress Notes (Signed)
Physical Therapy Treatment Patient Details Name: [name] MRN: [id number] DOB: 1955-05-06 Today's Date: 12/27/2017    History of Present Illness 62 yo female s/p L TKA. Hx of obesity, hernia repair 2018, bipolar d/o    PT Comments    Progressing with mobility. Pt requires encouragement to put forth max effort.    Follow Up Recommendations  Follow surgeon's recommendation for DC plan and follow-up therapies     Equipment Recommendations  Rolling walker with 5" wheels(youth height)    Recommendations for Other Services       Precautions / Restrictions Precautions Precautions: Fall;Knee Required Braces or Orthoses: Knee Immobilizer - Left Knee Immobilizer - Left: Discontinue once straight leg raise with < 10 degree lag Restrictions Weight Bearing Restrictions: No Other Position/Activity Restrictions: WBAT    Mobility  Bed Mobility Overal bed mobility: Needs Assistance Bed Mobility: Supine to Sit     Supine to sit: HOB elevated;Min assist     General bed mobility comments: Assist for L LE. Increased time. Utilized bedpad for scooting, positioning. Cues for safety, technique, and for pt to put forth max effort.   Transfers Overall transfer level: Needs assistance Equipment used: Rolling walker (2 wheeled) Transfers: Sit to/from Stand Sit to Stand: Mod assist         General transfer comment:  VCs safety, technique, hand placement, max effort. Increased time. Assist to rise, stabilize, control descent.   Ambulation/Gait Ambulation/Gait assistance: Min assist Gait Distance (Feet): 50 Feet Assistive device: Rolling walker (2 wheeled) Gait Pattern/deviations: Step-to pattern;Decreased step length - right;Decreased step length - left     General Gait Details: Assist to rise, stabilize, control descent. Increased time. VCs safety, technique, sequence, posture, and for pt to increase step length   Stairs             Wheelchair Mobility    Modified  Rankin (Stroke Patients Only)       Balance Overall balance assessment: Needs assistance         Standing balance support: Bilateral upper extremity supported Standing balance-Leahy Scale: Poor                              Cognition Arousal/Alertness: Awake/alert Behavior During Therapy: WFL for tasks assessed/performed Overall Cognitive Status: Within Functional Limits for tasks assessed                                        Exercises Total Joint Exercises Ankle Circles/Pumps: AROM;Both;10 reps;Supine Quad Sets: AROM;Both;10 reps;Supine Heel Slides: AAROM;Left;10 reps;Supine Hip ABduction/ADduction: AAROM;Left;10 reps;Supine Straight Leg Raises: AAROM;Left;10 reps;Supine Goniometric ROM: ~15-50 degrees    General Comments        Pertinent Vitals/Pain Pain Assessment: 0-10 Pain Score: 6  Pain Location: L knee Pain Descriptors / Indicators: Aching;Sore Pain Intervention(s): Monitored during session;Repositioned;Ice applied    Home Living                      Prior Function            PT Goals (current goals can now be found in the care plan section) Progress towards PT goals: Progressing toward goals    Frequency    7X/week      PT Plan Current plan remains appropriate    Co-evaluation  AM-PAC PT "6 Clicks" Daily Activity  Outcome Measure  Difficulty turning over in bed (including adjusting bedclothes, sheets and blankets)?: Unable Difficulty moving from lying on back to sitting on the side of the bed? : Unable Difficulty sitting down on and standing up from a chair with arms (e.g., wheelchair, bedside commode, etc,.)?: Unable Help needed moving to and from a bed to chair (including a wheelchair)?: A Lot Help needed walking in hospital room?: A Lot Help needed climbing 3-5 steps with a railing? : A Lot 6 Click Score: 9    End of Session Equipment Utilized During Treatment: Gait belt;Left  knee immobilizer Activity Tolerance: Patient limited by fatigue;Patient limited by pain Patient left: in chair;with call bell/phone within reach   PT Visit Diagnosis: Pain;Difficulty in walking, not elsewhere classified (R26.2);Other abnormalities of gait and mobility (R26.89) Pain - Right/Left: Left Pain - part of body: Knee     Time: 0175-1025 PT Time Calculation (min) (ACUTE ONLY): 30 min  Charges:  $Gait Training: 8-22 mins $Therapeutic Exercise: 8-22 mins                        Weston Anna, PT Acute Rehabilitation Services Pager: [phone] Office: [phone]

## 2017-12-27 NOTE — Progress Notes (Signed)
Physical Therapy Treatment Patient Details Name: [name] MRN: [id number] DOB: [birth year] Today's Date: 12/27/2017    History of Present Illness 62 yo female s/p L TKA. Hx of obesity, hernia repair 2018, bipolar d/o    PT Comments    Progressing slowly with mobility. Encouragement for pt to put forth max effort required. Recommend OT consult for education prior to d/c home.    Follow Up Recommendations  Follow surgeon's recommendation for DC plan and follow-up therapies     Equipment Recommendations  Rolling walker with 5" wheels(youth height)    Recommendations for Other Services       Precautions / Restrictions Precautions Precautions: Fall;Knee Required Braces or Orthoses: Knee Immobilizer - Left Knee Immobilizer - Left: Discontinue once straight leg raise with < 10 degree lag Restrictions Weight Bearing Restrictions: No Other Position/Activity Restrictions: WBAT    Mobility  Bed Mobility Overal bed mobility: Needs Assistance Bed Mobility: Sit to Supine     Supine to sit: HOB elevated;Min assist Sit to supine: Min assist;HOB elevated   General bed mobility comments: Assist for L LE. Increased time. Cues for safety, technique, and for pt to put forth max effort.   Transfers Overall transfer level: Needs assistance Equipment used: Rolling walker (2 wheeled) Transfers: Sit to/from Stand Sit to Stand: Min assist         General transfer comment:  VCs safety, technique, hand placement, max effort. Increased time. Assist to rise, stabilize, control descent.   Ambulation/Gait Ambulation/Gait assistance: Min guard Gait Distance (Feet): 60 Feet Assistive device: Rolling walker (2 wheeled) Gait Pattern/deviations: Step-to pattern;Decreased step length - right;Decreased step length - left;Trunk flexed     General Gait Details: Assist to rise, stabilize, control descent. Increased time. VCs safety, technique, sequence, posture, and for pt to increase step  length   Stairs             Wheelchair Mobility    Modified Rankin (Stroke Patients Only)       Balance Overall balance assessment: Needs assistance         Standing balance support: Bilateral upper extremity supported Standing balance-Leahy Scale: Poor                              Cognition Arousal/Alertness: Awake/alert Behavior During Therapy: WFL for tasks assessed/performed Overall Cognitive Status: Within Functional Limits for tasks assessed                                        Exercises     General Comments        Pertinent Vitals/Pain Pain Assessment: 0-10 Pain Score: 8  Pain Location: L knee Pain Descriptors / Indicators: Aching;Sore Pain Intervention(s): Monitored during session;Limited activity within patient's tolerance;Repositioned;Ice applied    Home Living                      Prior Function            PT Goals (current goals can now be found in the care plan section) Progress towards PT goals: Progressing toward goals    Frequency    7X/week      PT Plan Current plan remains appropriate    Co-evaluation              AM-PAC PT "6 Clicks" Daily Activity  Outcome  Measure  Difficulty turning over in bed (including adjusting bedclothes, sheets and blankets)?: Unable Difficulty moving from lying on back to sitting on the side of the bed? : Unable Difficulty sitting down on and standing up from a chair with arms (e.g., wheelchair, bedside commode, etc,.)?: Unable Help needed moving to and from a bed to chair (including a wheelchair)?: A Lot Help needed walking in hospital room?: A Lot Help needed climbing 3-5 steps with a railing? : A Lot 6 Click Score: 9    End of Session Equipment Utilized During Treatment: Gait belt;Left knee immobilizer Activity Tolerance: Patient limited by fatigue;Patient limited by pain Patient left: in chair;with call bell/phone within reach   PT Visit  Diagnosis: Pain;Difficulty in walking, not elsewhere classified (R26.2);Other abnormalities of gait and mobility (R26.89) Pain - Right/Left: Left Pain - part of body: Knee     Time: 3300-7622 PT Time Calculation (min) (ACUTE ONLY): 35 min  Charges:  $Gait Training: 23-37 mins $Therapeutic Exercise: 8-22 mins                      Weston Anna, PT Acute Rehabilitation Services Pager: [phone] Office: [phone]

## 2017-12-27 NOTE — Care Management Obs Status (Signed)
[place] NOTIFICATION   Patient Details  Name: [name] MRN: [id number] Date of Birth: 11-03-1955   Medicare Observation Status Notification Given:  Yes    Guadalupe Maple, RN 12/27/2017, 12:39 PM

## 2017-12-28 ENCOUNTER — Other Ambulatory Visit: Payer: Self-pay

## 2017-12-28 DIAGNOSIS — M1712 Unilateral primary osteoarthritis, left knee: Secondary | ICD-10-CM | POA: Diagnosis not present

## 2017-12-28 LAB — BASIC METABOLIC PANEL
Anion gap: 11 (ref 5–15)
BUN: 11 mg/dL (ref 8–23)
CALCIUM: 9.1 mg/dL (ref 8.9–10.3)
CO2: 25 mmol/L (ref 22–32)
Chloride: 100 mmol/L (ref 98–111)
Creatinine, Ser: 0.79 mg/dL (ref 0.44–1.00)
GFR calc Af Amer: >60 mL/min (ref >60)
Glucose, Bld: 147 mg/dL — ABNORMAL HIGH (ref 70–99)
Potassium: 3.7 mmol/L (ref 3.5–5.1)
Sodium: 136 mmol/L (ref 135–145)

## 2017-12-28 LAB — CBC
HCT: 34.8 % — ABNORMAL LOW (ref 36.0–46.0)
Hemoglobin: 11.3 g/dL — ABNORMAL LOW (ref 12.0–15.0)
MCH: 30.8 pg (ref 26.0–34.0)
MCHC: 32.5 g/dL (ref 30.0–36.0)
MCV: 94.8 fL (ref 80.0–100.0)
PLATELETS: 193 10*3/uL (ref 150–400)
RBC: 3.67 MIL/uL — ABNORMAL LOW (ref 3.87–5.11)
RDW: 13.4 % (ref 11.5–15.5)
WBC: 16.4 10*3/uL — ABNORMAL HIGH (ref 4.0–10.5)
nRBC: 0 % (ref 0.0–0.2)

## 2017-12-28 MED ORDER — ACETAMINOPHEN 500 MG PO TABS
500.0000 mg | ORAL_TABLET | Freq: Once | ORAL | Status: AC
  Administered 2017-12-28: 500 mg via ORAL

## 2017-12-28 NOTE — Progress Notes (Signed)
Physical Therapy Treatment Patient Details Name: [name] MRN: [id number] DOB: 12/22/1955 Today's Date: 12/28/2017    History of Present Illness 62 yo female s/p L TKA. Hx of obesity, hernia repair 2018, bipolar d/o    PT Comments    Progressing with mobility. Pt has decided she wishes to stay another night-made RN aware. Will continue to follow and progress activity as tolerated.   Follow Up Recommendations  Follow surgeon's recommendation for DC plan and follow-up therapies     Equipment Recommendations       Recommendations for Other Services       Precautions / Restrictions Precautions Precautions: Knee;Fall Required Braces or Orthoses: Knee Immobilizer - Left Knee Immobilizer - Left: Discontinue once straight leg raise with < 10 degree lag Restrictions Weight Bearing Restrictions: No Other Position/Activity Restrictions: WBAT    Mobility  Bed Mobility Overal bed mobility: Needs Assistance Bed Mobility: Supine to Sit     Supine to sit: HOB elevated;Min guard    General bed mobility comments: Pt used gait belt to assist L LE off bed. Increased time. Cues for safety, technique.   Transfers Overall transfer level: Needs assistance Equipment used: Rolling walker (2 wheeled) Transfers: Sit to/from Stand Sit to Stand: Min assist         General transfer comment: assist to rise and stabilize.  Easier from 3:1 commode than bed as she had 2 rails to hold to. VCs safety, technique, hand/LE placement.   Ambulation/Gait Ambulation/Gait assistance: Min guard Gait Distance (Feet): 50 Feet Assistive device: Rolling walker (2 wheeled) Gait Pattern/deviations: Step-to pattern;Decreased step length - right;Decreased step length - left;Trunk flexed     General Gait Details: Close guard for safety. VCs safety, sequence, step length, posture. Slow gait speed.    Stairs             Wheelchair Mobility    Modified Rankin (Stroke Patients Only)        Balance                                            Cognition Arousal/Alertness: Awake/alert Behavior During Therapy: WFL for tasks assessed/performed Overall Cognitive Status: Within Functional Limits for tasks assessed                                        Exercises Total Joint Exercises Ankle Circles/Pumps: AROM;Both;10 reps;Supine Quad Sets: AROM;Both;10 reps;Supine Heel Slides: AAROM;Left;10 reps;Supine Hip ABduction/ADduction: AAROM;Left;10 reps;Supine Straight Leg Raises: AAROM;Left;10 reps;Supine Goniometric ROM: ~15-50 degrees (pt used sheet to assist)    General Comments        Pertinent Vitals/Pain Pain Assessment: 0-10 Pain Score: 8  Pain Location: L knee Pain Descriptors / Indicators: Aching;Sore Pain Intervention(s): Monitored during session;Repositioned;Ice applied    Home Living Family/patient expects to be discharged to:: Private residence Living Arrangements: Alone Available Help at Discharge: Friend(s)         Home Equipment: Kasandra Knudsen - single point Additional Comments: will stay with friend initially    Prior Function Level of Independence: Independent          PT Goals (current goals can now be found in the care plan section) Acute Rehab PT Goals Patient Stated Goal: less pain. to be able to d/c home Progress towards PT goals: Progressing  toward goals    Frequency    7X/week      PT Plan Current plan remains appropriate    Co-evaluation              AM-PAC PT "6 Clicks" Daily Activity  Outcome Measure  Difficulty turning over in bed (including adjusting bedclothes, sheets and blankets)?: Unable Difficulty moving from lying on back to sitting on the side of the bed? : Unable Difficulty sitting down on and standing up from a chair with arms (e.g., wheelchair, bedside commode, etc,.)?: Unable Help needed moving to and from a bed to chair (including a wheelchair)?: A Little Help needed walking  in hospital room?: A Little Help needed climbing 3-5 steps with a railing? : A Lot 6 Click Score: 11    End of Session Equipment Utilized During Treatment: Gait belt;Left knee immobilizer Activity Tolerance: Patient limited by fatigue;Patient limited by pain Patient left: in chair;with call bell/phone within reach   PT Visit Diagnosis: Pain;Difficulty in walking, not elsewhere classified (R26.2);Other abnormalities of gait and mobility (R26.89) Pain - Right/Left: Left Pain - part of body: Knee     Time: 7703-4035 PT Time Calculation (min) (ACUTE ONLY): 55 min  Charges:  $Gait Training: 23-37 mins $Therapeutic Exercise: 23-37 mins                       Weston Anna, PT Acute Rehabilitation Services Pager: [phone] Office: [phone]

## 2017-12-28 NOTE — Progress Notes (Signed)
Subjective: 2 Days Post-Op Procedure(s) (LRB): LEFT TOTAL KNEE ARTHROPLASTY (Left) Patient reports pain as mild.   Patient seen in rounds for Dr. Wynelle Link. Patient is well, and has had no acute complaints or problems other than knee pain. She states the right knee is painful as well, as she has known osteoarthritis. Did well with therapy yesterday. Voiding without difficulty and positive flatus. Denies chest pain, SOB, or calf pain. Plan is to go Home after hospital stay.  Objective: Vital signs in last 24 hours: Temp:  [98.5 F (36.9 C)-99.5 F (37.5 C)] 99.5 F (37.5 C) (11/20 0527) Pulse Rate:  [80-110] 106 (11/20 0527) Resp:  [16-18] 18 (11/20 0527) BP: (137-144)/(79-83) 137/83 (11/20 0527) SpO2:  [92 %-99 %] 99 % (11/20 0527)  Intake/Output from previous day:  Intake/Output Summary (Last 24 hours) at 12/28/2017 0733 Last data filed at 12/28/2017 0600 Gross per 24 hour  Intake 1170.22 ml  Output 2250 ml  Net -1079.78 ml    Labs: Recent Labs    12/27/17 0418 12/28/17 0550  HGB 11.3* 11.3*   Recent Labs    12/27/17 0418 12/28/17 0550  WBC 13.8* 16.4*  RBC 3.67* 3.67*  HCT 35.6* 34.8*  PLT 190 193   Recent Labs    12/27/17 0418 12/28/17 0550  NA 140 136  K 3.3* 3.7  CL 102 100  CO2 27 25  BUN 11 11  CREATININE 0.95 0.79  GLUCOSE 154* 147*  CALCIUM 9.1 9.1   Exam: General - Patient is Alert and Oriented Extremity - Neurologically intact Neurovascular intact Sensation intact distally Dorsiflexion/Plantar flexion intact Dressing/Incision - clean, dry, no drainage Motor Function - intact, moving foot and toes well on exam.   Past Medical History:  Diagnosis Date  . Allergic rhinitis   . Anxiety   . Benign essential tremor    hands  . Bipolar 1 disorder, mixed, moderate ([place])   . Claustrophobia   . Depression   . Eczema   . Frequency of urination   . History of frequent urinary tract infections   . History of gastroesophageal reflux (GERD)     05-25-2017  per pt no issues since hiatal hernia repair 01/ 2018  . History of hiatal hernia   . History of melanoma excision    early 2000s-- BACK  . History of panic attacks   . 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 Hypothyroidism   . Intermittent palpitations    cardiology--  dr Martinique  . Interstitial cystitis   . Limited jaw range of motion    s/p  bilateral TMJ surgery,  age 76s  . Migraines   . Moderate persistent asthma    pulmologist-- dr Halford Chessman  . OA (osteoarthritis)    both knees  . OSA on CPAP    per last sleep study 09/ 2017  mild OSA, AHI13/hr  . PONV (postoperative nausea and vomiting)   . PVC (premature ventricular contraction)   . Rosacea   . Sensation of pressure in bladder area    per pt intermittant  . TMJ arthralgia   . Urticaria   . Wears glasses   . White coat syndrome without hypertension    05-25-2017  per pt hx hypertension yrs ago, no issues after quitting stressful job    Assessment/Plan: 2 Days Post-Op Procedure(s) (LRB): LEFT TOTAL KNEE ARTHROPLASTY (Left) Principal Problem:   OA (osteoarthritis) of knee  Estimated body mass index is 29.56 kg/m as calculated  from the following:   Height as of this encounter: 5' (1.524 m).   Weight as of this encounter: 68.7 kg. Up with therapy D/C IV fluids  DVT Prophylaxis - Xarelto Weight-bearing as tolerated  Possible discharge this afternoon if progresses with therapy and meeting her goals. Scheduled for outpatient physical [facility] at [facility] on Friday. Follow-up in the office in 2 weeks with Dr. Wynelle Link.  Theresa Duty, PA-C Orthopedic Surgery 12/28/2017, 7:33 AM

## 2017-12-28 NOTE — Evaluation (Signed)
Occupational Therapy Evaluation Patient Details Name: [name] MRN: [id number] DOB: 04-13-1955 Today's Date: 12/28/2017    History of Present Illness 62 yo female s/p L TKA. Hx of obesity, hernia repair 2018, bipolar d/o   Clinical Impression   Pt was admitted for the above sx.  She performs activities very slowly but is motivated to do as much as she can for herself.  Overall min A is needed. Will follow in acute setting focusing on toileting and educating caregiver/friend who was not present during the evaluation    Follow Up Recommendations  Supervision/Assistance - 24 hour    Equipment Recommendations  3 in 1 bedside commode    Recommendations for Other Services       Precautions / Restrictions Precautions Precautions: Fall;Knee Required Braces or Orthoses: Knee Immobilizer - Left Knee Immobilizer - Left: Discontinue once straight leg raise with < 10 degree lag Restrictions Weight Bearing Restrictions: No Other Position/Activity Restrictions: WBAT      Mobility Bed Mobility         Supine to sit: Min assist Sit to supine: Min assist   General bed mobility comments: HOB 20 degrees.  pt needed help with chux pad to move over in bed.  Also assisted LLE; pt was able to slide it off bed when KI donned, which she helped with. Did not use bedrails  Transfers   Equipment used: Rolling walker (2 wheeled)   Sit to Stand: Min assist         General transfer comment: assist to rise and stabilize.  Easier from 3:1 commode than bed as she had 2 rails to hold to    Balance                                           ADL either performed or assessed with clinical judgement   ADL Overall ADL's : Needs assistance/impaired Eating/Feeding: Independent   Grooming: Min guard;Standing   Upper Body Bathing: Set up;Sitting   Lower Body Bathing: Minimal assistance;Sit to/from stand   Upper Body Dressing : Set up;Sitting   Lower Body Dressing:  Maximal assistance;Sit to/from stand   Toilet Transfer: Minimal assistance;Ambulation;BSC;RW   Toileting- Clothing Manipulation and Hygiene: Minimal assistance;Sit to/from stand         General ADL Comments: Pt requires extra time for all activities.  Donned KI with min A using gait belt to help lift leg.  Had to use chux pad to assist with bed mobility both in and out of bed. Educated that if she uses a small stool, she will be able to slide forward and backwards on bed more efficiently.  Pt wanted to practice getting in/out on L side as she hadn't done this before. Ambulated to bathrom and used commode.  Friend will assist with LB adls and pt will sponge bathe as friend has a tub     Vision         Perception     Praxis      Pertinent Vitals/Pain Pain Score: 5  Pain Location: L knee Pain Descriptors / Indicators: Aching;Sore Pain Intervention(s): Limited activity within patient's tolerance;Monitored during session;Premedicated before session;Repositioned     Hand Dominance     Extremity/Trunk Assessment Upper Extremity Assessment Upper Extremity Assessment: Generalized weakness(has bil tremors:  doesn't seem to interfere)           Communication Communication Communication:  No difficulties   Cognition Arousal/Alertness: Awake/alert Behavior During Therapy: WFL for tasks assessed/performed Overall Cognitive Status: Within Functional Limits for tasks assessed                                     General Comments       Exercises     Shoulder Instructions      Home Living Family/patient expects to be discharged to:: Private residence Living Arrangements: Alone Available Help at Discharge: Friend(s)               Bathroom Shower/Tub: Teacher, early years/pre: [employer]     Home Equipment: Kasandra Knudsen - single point   Additional Comments: will stay with friend initially      Prior Functioning/Environment Level of Independence:  Independent                 OT Problem List: Decreased strength;Decreased activity tolerance;Decreased knowledge of use of DME or AE;Pain      OT Treatment/Interventions: Self-care/ADL training;DME and/or AE instruction;Energy conservation;Patient/family education;Therapeutic activities    OT Goals(Current goals can be found in the care plan section) Acute Rehab OT Goals Patient Stated Goal: less pain. to be able to d/c home OT Goal Formulation: With patient Time For Goal Achievement: 01/11/18 Potential to Achieve Goals: Good ADL Goals Pt Will Transfer to Toilet: with min guard assist;bedside commode;stand pivot transfer;ambulating Pt Will Perform Toileting - Clothing Manipulation and hygiene: with min guard assist;sit to/from stand Additional ADL Goal #1: friend will verbalize vs demonstrate helping with bed mobility, adls and transfers and verbalize understanding of knee precautions  OT Frequency: Min 2X/week   Barriers to D/C:            Co-evaluation              AM-PAC PT "6 Clicks" Daily Activity     Outcome Measure Help from another person eating meals?: None Help from another person taking care of personal grooming?: A Little Help from another person toileting, which includes using toliet, bedpan, or urinal?: A Little Help from another person bathing (including washing, rinsing, drying)?: A Little Help from another person to put on and taking off regular upper body clothing?: A Little Help from another person to put on and taking off regular lower body clothing?: A Lot 6 Click Score: 18   End of Session    Activity Tolerance: Patient tolerated treatment well Patient left: in bed;with call bell/phone within reach;with bed alarm set  OT Visit Diagnosis: Pain Pain - Right/Left: Left Pain - part of body: Knee                Time: 7494-4967 OT Time Calculation (min): 48 min Charges:  OT General Charges $OT Visit: 1 Visit OT Evaluation $OT Eval Low  Complexity: 1 Low OT Treatments $Self Care/Home Management : 23-37 mins  Lesle Chris, OTR/L Acute Rehabilitation Services [phone] [facility] pager [phone] office 12/28/2017  Kamaljit Hizer 12/28/2017, 11:34 AM

## 2017-12-28 NOTE — Progress Notes (Signed)
Patient was noted with temperature of 100.7, pulse 129. PA Amie [place] was notified and ordered to administer Tylenol 500 mg po. Orders carried out. Will monitor closely.

## 2017-12-28 NOTE — Progress Notes (Signed)
Physical Therapy Treatment Patient Details Name: [name] MRN: [id number] DOB: 1955-04-16 Today's Date: 12/28/2017    History of Present Illness 62 yo female s/p L TKA. Hx of obesity, hernia repair 2018, bipolar d/o    PT Comments    Continuing to encourage pt to put forth max effort when mobilizing. Instructed her to make caregiver aware that she will require some physical assistance at home.   Follow Up Recommendations  Follow surgeon's recommendation for DC plan and follow-up therapies     Equipment Recommendations       Recommendations for Other Services       Precautions / Restrictions Precautions Precautions: Knee;Fall Required Braces or Orthoses: Knee Immobilizer - Left Knee Immobilizer - Left: Discontinue once straight leg raise with < 10 degree lag Restrictions Weight Bearing Restrictions: No Other Position/Activity Restrictions: WBAT    Mobility  Bed Mobility Overal bed mobility: Needs Assistance Bed Mobility: Sit to Supine     Supine to sit: HOB elevated;Min guard Sit to supine: Mod assist;HOB elevated   General bed mobility comments: Assist for bil LEs this session. Pt stated she couldn't get R LE or surgical LE onto bed this session. Continue to encourage pt to put forth full effort.   Transfers Overall transfer level: Needs assistance Equipment used: Rolling walker (2 wheeled) Transfers: Sit to/from Stand Sit to Stand: Min assist         General transfer comment: Assist to rise and stabilize. VCs safety, technique, hand/LE placement. Increased time.  Ambulation/Gait Ambulation/Gait assistance: Min guard Gait Distance (Feet): 30 Feet Assistive device: Rolling walker (2 wheeled) Gait Pattern/deviations: Step-to pattern;Decreased step length - right;Decreased step length - left;Trunk flexed     General Gait Details: Close guard for safety. VCs safety, sequence, step length, posture. Slow gait speed.    Stairs              Wheelchair Mobility    Modified Rankin (Stroke Patients Only)       Balance Overall balance assessment: Needs assistance         Standing balance support: Bilateral upper extremity supported Standing balance-Leahy Scale: Poor                              Cognition Arousal/Alertness: Awake/alert Behavior During Therapy: WFL for tasks assessed/performed Overall Cognitive Status: Within Functional Limits for tasks assessed                                        Exercises   General Comments        Pertinent Vitals/Pain Pain Assessment: 0-10 Pain Score: 7  Pain Location: L knee Pain Descriptors / Indicators: Aching;Sore Pain Intervention(s): Monitored during session;Repositioned    Home Living                      Prior Function            PT Goals (current goals can now be found in the care plan section) Progress towards PT goals: Progressing toward goals    Frequency    7X/week      PT Plan Current plan remains appropriate    Co-evaluation              AM-PAC PT "6 Clicks" Daily Activity  Outcome Measure  Difficulty turning over in bed (including  adjusting bedclothes, sheets and blankets)?: Unable Difficulty moving from lying on back to sitting on the side of the bed? : Unable Difficulty sitting down on and standing up from a chair with arms (e.g., wheelchair, bedside commode, etc,.)?: Unable Help needed moving to and from a bed to chair (including a wheelchair)?: A Little Help needed walking in hospital room?: A Little Help needed climbing 3-5 steps with a railing? : A Lot 6 Click Score: 11    End of Session Equipment Utilized During Treatment: Gait belt Activity Tolerance: Patient limited by fatigue;Patient limited by pain Patient left: in bed;with call bell/phone within reach   PT Visit Diagnosis: Pain;Difficulty in walking, not elsewhere classified (R26.2);Other abnormalities of gait and  mobility (R26.89) Pain - Right/Left: Left Pain - part of body: Knee     Time: 3557-3220 PT Time Calculation (min) (ACUTE ONLY): 25 min  Charges:  $Gait Training: 23-37 mins $Therapeutic Exercise: 23-37 mins                     Weston Anna, PT Acute Rehabilitation Services Pager: [phone] Office: [phone]

## 2017-12-28 NOTE — Plan of Care (Signed)
  Problem: Health Behavior/Discharge Planning: Goal: Ability to manage health-related needs will improve Outcome: Progressing   Problem: Clinical Measurements: Goal: Ability to maintain clinical measurements within normal limits will improve Outcome: Progressing Goal: Will remain free from infection Outcome: Progressing Goal: Diagnostic test results will improve Outcome: Progressing Goal: Respiratory complications will improve Outcome: Progressing Goal: Cardiovascular complication will be avoided Outcome: Progressing   Problem: Activity: Goal: Risk for activity intolerance will decrease Outcome: Progressing   Problem: Nutrition: Goal: Adequate nutrition will be maintained Outcome: Progressing   Problem: Elimination: Goal: Will not experience complications related to bowel motility Outcome: Progressing   Problem: Pain Managment: Goal: General experience of comfort will improve Outcome: Progressing   Problem: Skin Integrity: Goal: Risk for impaired skin integrity will decrease Outcome: Progressing   Problem: Activity: Goal: Ability to avoid complications of mobility impairment will improve Outcome: Progressing Goal: Range of joint motion will improve Outcome: Progressing   Problem: Clinical Measurements: Goal: Postoperative complications will be avoided or minimized Outcome: Progressing   Problem: Pain Management: Goal: Pain level will decrease with appropriate interventions Outcome: Progressing

## 2017-12-28 NOTE — Progress Notes (Signed)
OT Cancellation Note  Patient Details Name: [name] MRN: [id number] DOB: Jul 05, 1955   Cancelled Treatment:    Reason Eval/Treat Not Completed: Other (comment). Pt is sleeping soundly this am. Will return later this morning.  Meela Wareing 12/28/2017, 8:01 AM  Lesle Chris, OTR/L Acute Rehabilitation Services [phone] [facility] pager [phone] office 12/28/2017

## 2017-12-29 DIAGNOSIS — M1712 Unilateral primary osteoarthritis, left knee: Secondary | ICD-10-CM | POA: Diagnosis not present

## 2017-12-29 LAB — CBC
HCT: 31.2 % — ABNORMAL LOW (ref 36.0–46.0)
Hemoglobin: 10 g/dL — ABNORMAL LOW (ref 12.0–15.0)
MCH: 30.4 pg (ref 26.0–34.0)
MCHC: 32.1 g/dL (ref 30.0–36.0)
MCV: 94.8 fL (ref 80.0–100.0)
PLATELETS: 162 10*3/uL (ref 150–400)
RBC: 3.29 MIL/uL — ABNORMAL LOW (ref 3.87–5.11)
RDW: 13.4 % (ref 11.5–15.5)
WBC: 12.2 10*3/uL — AB (ref 4.0–10.5)
nRBC: 0 % (ref 0.0–0.2)

## 2017-12-29 NOTE — Progress Notes (Signed)
Physical Therapy Treatment Patient Details Name: [name] MRN: [id number] DOB: October 04, 1955 Today's Date: 12/29/2017    History of Present Illness 62 yo female s/p L TKA. Hx of obesity, hernia repair 2018, bipolar d/o    PT Comments    Progressing slowly with mobility. Plan is for d/c home later today. Will plan to have a 2nd session to practice stair negotiation and provide education for pt/friend.    Follow Up Recommendations  Follow surgeon's recommendation for DC plan and follow-up therapies     Equipment Recommendations       Recommendations for Other Services       Precautions / Restrictions Precautions Precautions: Knee;Fall Required Braces or Orthoses: Knee Immobilizer - Left Knee Immobilizer - Left: Discontinue once straight leg raise with < 10 degree lag Restrictions Weight Bearing Restrictions: No Other Position/Activity Restrictions: WBAT    Mobility  Bed Mobility Overal bed mobility: Needs Assistance Bed Mobility: Supine to Sit     Supine to sit: HOB elevated;Min guard     General bed mobility comments: Increased time. Cues required. Close guard for safety. Pt used gait belt as a leg lifter to move L LE.   Transfers Overall transfer level: Needs assistance Equipment used: Rolling walker (2 wheeled) Transfers: Sit to/from Stand Sit to Stand: Min assist         General transfer comment: Assist to rise and stabilize. VCs safety, technique, hand/LE placement. Increased time.  Ambulation/Gait Ambulation/Gait assistance: Min guard Gait Distance (Feet): 65 Feet Assistive device: Rolling walker (2 wheeled) Gait Pattern/deviations: Step-to pattern;Decreased step length - right;Decreased step length - left;Trunk flexed     General Gait Details: Close guard for safety. VCs safety, sequence, step length, posture. Slow gait speed.    Stairs             Wheelchair Mobility    Modified Rankin (Stroke Patients Only)       Balance  Overall balance assessment: Needs assistance         Standing balance support: Bilateral upper extremity supported Standing balance-Leahy Scale: Poor                              Cognition Arousal/Alertness: Awake/alert Behavior During Therapy: WFL for tasks assessed/performed Overall Cognitive Status: Within Functional Limits for tasks assessed                                        Exercises Total Joint Exercises Ankle Circles/Pumps: AROM;Both;10 reps;Supine Quad Sets: AROM;Both;10 reps;Supine Heel Slides: AAROM;Left;10 reps;Supine Hip ABduction/ADduction: AAROM;Left;10 reps;Supine Straight Leg Raises: AAROM;Left;10 reps;Supine Goniometric ROM: ~15-50 degrees (pt used sheet to assist)    General Comments        Pertinent Vitals/Pain Pain Assessment: 0-10 Pain Score: 7  Pain Location: L knee Pain Descriptors / Indicators: Aching;Sore Pain Intervention(s): Monitored during session;Repositioned;Ice applied    Home Living                      Prior Function            PT Goals (current goals can now be found in the care plan section) Progress towards PT goals: Progressing toward goals    Frequency    7X/week      PT Plan Current plan remains appropriate    Co-evaluation  AM-PAC PT "6 Clicks" Daily Activity  Outcome Measure  Difficulty turning over in bed (including adjusting bedclothes, sheets and blankets)?: Unable Difficulty moving from lying on back to sitting on the side of the bed? : A Little Difficulty sitting down on and standing up from a chair with arms (e.g., wheelchair, bedside commode, etc,.)?: Unable Help needed moving to and from a bed to chair (including a wheelchair)?: A Little Help needed walking in hospital room?: A Little Help needed climbing 3-5 steps with a railing? : A Lot 6 Click Score: 13    End of Session Equipment Utilized During Treatment: Gait belt Activity Tolerance:  Patient tolerated treatment well Patient left: with call bell/phone within reach;in chair   PT Visit Diagnosis: Pain;Difficulty in walking, not elsewhere classified (R26.2);Other abnormalities of gait and mobility (R26.89) Pain - Right/Left: Left Pain - part of body: Knee     Time: 7672-0947 PT Time Calculation (min) (ACUTE ONLY): 47 min  Charges:  $Gait Training: 23-37 mins $Therapeutic Exercise: 8-22 mins                        Weston Anna, PT Acute Rehabilitation Services Pager: [phone] Office: [phone]

## 2017-12-29 NOTE — Progress Notes (Signed)
Occupational Therapy Treatment Patient Details Name: [name] MRN: [id number] DOB: 03-04-1955 Today's Date: 12/29/2017    History of present illness 62 yo female s/p L TKA. Hx of obesity, hernia repair 2018, bipolar d/o   OT comments  Friend present for family education.  Educated on [employer], application and use of KI.  Educated on using sheet and stool for bed mobility, but did not practice this today.  PT following me for education  Follow Up Recommendations  Supervision/Assistance - 24 hour    Equipment Recommendations  3 in 1 bedside commode    Recommendations for Other Services      Precautions / Restrictions Precautions Precautions: Knee;Fall Required Braces or Orthoses: Knee Immobilizer - Left Knee Immobilizer - Left: Discontinue once straight leg raise with < 10 degree lag Restrictions Weight Bearing Restrictions: No Other Position/Activity Restrictions: WBAT       Mobility Bed Mobility Overal bed mobility: Needs Assistance Bed Mobility: Supine to Sit     Supine to sit: HOB elevated;Min guard     General bed mobility comments: oob  Transfers Overall transfer level: Needs assistance Equipment used: Rolling walker (2 wheeled) Transfers: Sit to/from Stand Sit to Stand: Min guard;Min assist         General transfer comment: light hand on pt for sit to stand and extra time    Balance Overall balance assessment: Needs assistance         Standing balance support: Bilateral upper extremity supported Standing balance-Leahy Scale: Poor                             ADL either performed or assessed with clinical judgement   ADL                       Lower Body Dressing: Maximal assistance;Sit to/from stand                 General ADL Comments: friend present for family education.  Assisted pt with LB dressing.  Pt assisted with KI. Educated on trifolded sheet to assist with bed mobility if needed and use of stool to  boost back on bed.  Friend verbalizes understanding and feels comfortable helping     Vision       Perception     Praxis      Cognition Arousal/Alertness: Awake/alert Behavior During Therapy: WFL for tasks assessed/performed Overall Cognitive Status: Within Functional Limits for tasks assessed                                          Exercises    Shoulder Instructions       General Comments      Pertinent Vitals/ Pain       Pain Assessment: 0-10 Pain Score: 5  Pain Location: L knee Pain Descriptors / Indicators: Aching;Sore Pain Intervention(s): Limited activity within patient's tolerance;Monitored during session;Repositioned;Premedicated before session  Home Living                                          Prior Functioning/Environment              Frequency           Progress Toward  Goals  OT Goals(current goals can now be found in the care plan section)  Progress towards OT goals: Progressing toward goals(plans home today with friend)     Plan      Co-evaluation                 AM-PAC PT "6 Clicks" Daily Activity     Outcome Measure   Help from another person eating meals?: None Help from another person taking care of personal grooming?: A Little Help from another person toileting, which includes using toliet, bedpan, or urinal?: A Little Help from another person bathing (including washing, rinsing, drying)?: A Little Help from another person to put on and taking off regular upper body clothing?: A Little Help from another person to put on and taking off regular lower body clothing?: A Lot 6 Click Score: 18    End of Session    OT Visit Diagnosis: Pain Pain - Right/Left: Left Pain - part of body: Knee   Activity Tolerance Patient tolerated treatment well   Patient Left in chair;with call bell/phone within reach;with family/visitor present   Nurse Communication          Time:  0722-5750 OT Time Calculation (min): 24 min  Charges: OT General Charges $OT Visit: 1 Visit OT Treatments $Self Care/Home Management : 23-37 mins  Lesle Chris, OTR/L Acute Rehabilitation Services [phone] [facility] pager [phone] office 12/29/2017   Jordani Nunn 12/29/2017, 2:38 PM

## 2017-12-29 NOTE — Progress Notes (Signed)
Subjective: 3 Days Post-Op Procedure(s) (LRB): LEFT TOTAL KNEE ARTHROPLASTY (Left) Patient reports pain as mild.   Patient seen in rounds by Dr. Wynelle Link. Patient is well, and has had no acute complaints or problems. Had issues with tachycardia yesterday, informed by nurse that heart rate was fluctuating from 90s-120s. History of tachycardia and occasional PVCs for which she sees Betty Martinique, MD (PCP). EKG showed tachycardia with occasional PVCs, pt asymptomatic with O2 sats at 98% and BP stable. No further action was taken.   States she is ready to go home today. Voiding without difficulty and positive flatus. Denies chest pain, SOB, or calf pain. Plan is to go Home after hospital stay.  Objective: Vital signs in last 24 hours: Temp:  [97.7 F (36.5 C)-100.7 F (38.2 C)] 99.4 F (37.4 C) (11/21 0554) Pulse Rate:  [75-129] 102 (11/21 0554) Resp:  [18-28] 18 (11/21 0554) BP: (124-148)/(71-86) 148/86 (11/21 0554) SpO2:  [92 %-98 %] 97 % (11/21 0554)  Intake/Output from previous day:  Intake/Output Summary (Last 24 hours) at 12/29/2017 0712 Last data filed at 12/29/2017 0600 Gross per 24 hour  Intake 1190 ml  Output -  Net 1190 ml    Intake/Output this shift: No intake/output data recorded.  Labs: Recent Labs    12/27/17 0418 12/28/17 0550 12/29/17 0523  HGB 11.3* 11.3* 10.0*   Recent Labs    12/28/17 0550 12/29/17 0523  WBC 16.4* 12.2*  RBC 3.67* 3.29*  HCT 34.8* 31.2*  PLT 193 162   Recent Labs    12/27/17 0418 12/28/17 0550  NA 140 136  K 3.3* 3.7  CL 102 100  CO2 27 25  BUN 11 11  CREATININE 0.95 0.79  GLUCOSE 154* 147*  CALCIUM 9.1 9.1   Exam: General - Patient is Alert and Oriented Extremity - Neurologically intact Neurovascular intact Sensation intact distally Dorsiflexion/Plantar flexion intact Dressing/Incision - clean, dry, no drainage Motor Function - intact, moving foot and toes well on exam.   Past Medical History:  Diagnosis Date   . Allergic rhinitis   . Anxiety   . Benign essential tremor    hands  . Bipolar 1 disorder, mixed, moderate ([place])   . Claustrophobia   . Depression   . Eczema   . Frequency of urination   . History of frequent urinary tract infections   . History of gastroesophageal reflux (GERD)    05-25-2017  per pt no issues since hiatal hernia repair 01/ 2018  . History of hiatal hernia   . History of melanoma excision    early 2000s-- BACK  . History of panic attacks   . 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 Hypothyroidism   . Intermittent palpitations    cardiology--  dr Martinique  . Interstitial cystitis   . Limited jaw range of motion    s/p  bilateral TMJ surgery,  age 45s  . Migraines   . Moderate persistent asthma    pulmologist-- dr Halford Chessman  . OA (osteoarthritis)    both knees  . OSA on CPAP    per last sleep study 09/ 2017  mild OSA, AHI13/hr  . PONV (postoperative nausea and vomiting)   . PVC (premature ventricular contraction)   . Rosacea   . Sensation of pressure in bladder area    per pt intermittant  . TMJ arthralgia   . Urticaria   . Wears glasses   . White coat syndrome without hypertension  05-25-2017  per pt hx hypertension yrs ago, no issues after quitting stressful job    Assessment/Plan: 3 Days Post-Op Procedure(s) (LRB): LEFT TOTAL KNEE ARTHROPLASTY (Left) Principal Problem:   OA (osteoarthritis) of knee  Estimated body mass index is 29.56 kg/m as calculated from the following:   Height as of this encounter: 5' (1.524 m).   Weight as of this encounter: 68.7 kg. Up with therapy  DVT Prophylaxis - Xarelto Weight-bearing as tolerated  HR stable this AM. Plan for discharge to home today. Scheduled for outpatient physical therapy at [facility]. F/u in the office in 2 weeks.   Theresa Duty, PA-C Orthopedic Surgery 12/29/2017, 7:12 AM

## 2017-12-29 NOTE — Progress Notes (Addendum)
Physical Therapy Treatment Patient Details Name: [name] MRN: [id number] DOB: April 20, 1955 Today's Date: 12/29/2017    History of Present Illness 62 yo female s/p L TKA. Hx of obesity, hernia repair 2018, bipolar d/o    PT Comments    Progressing with mobility. 2nd session to review/practice gait training and 1 step negotiation training. All education completed with pt and caregiver. Okay to d/c from PT standpoint-made RN aware.    Follow Up Recommendations  Follow surgeon's recommendation for DC plan and follow-up therapies     Equipment Recommendations       Recommendations for Other Services       Precautions / Restrictions Precautions Precautions: Knee;Fall Required Braces or Orthoses: Knee Immobilizer - Left Knee Immobilizer - Left: Discontinue once straight leg raise with < 10 degree lag Restrictions Weight Bearing Restrictions: No Other Position/Activity Restrictions: WBAT    Mobility  Bed Mobility Overal bed mobility: Needs Assistance Bed Mobility: Supine to Sit          General bed mobility comments: oob  Transfers Overall transfer level: Needs assistance Equipment used: Rolling walker (2 wheeled) Transfers: Sit to/from Stand Sit to Stand: Min guard         General transfer comment: Close guard for safety. Increased time.   Ambulation/Gait Ambulation/Gait assistance: Min guard Gait Distance (Feet): 25 Feet Assistive device: Rolling walker (2 wheeled) Gait Pattern/deviations: Step-to pattern     General Gait Details: Close guard for safety. Slow gait speed.    Stairs Stairs: Yes Stairs assistance: Min assist Stair Management: Step to pattern;With walker;Forwards Number of Stairs: 1 General stair comments: VCS safety, technique, sequence. Assist to manage RW. Caregiver present to observe.   Wheelchair Mobility    Modified Rankin (Stroke Patients Only)       Balance Overall balance assessment: Needs assistance          Standing balance support: Bilateral upper extremity supported Standing balance-Leahy Scale: Poor                              Cognition Arousal/Alertness: Awake/alert Behavior During Therapy: WFL for tasks assessed/performed Overall Cognitive Status: Within Functional Limits for tasks assessed                                        Exercises    General Comments        Pertinent Vitals/Pain Pain Assessment: 0-10 Pain Score: 6  Pain Location: L knee Pain Descriptors / Indicators: Aching;Sore Pain Intervention(s): Monitored during session;Repositioned    Home Living                      Prior Function            PT Goals (current goals can now be found in the care plan section) Progress towards PT goals: Progressing toward goals    Frequency    7X/week      PT Plan Current plan remains appropriate    Co-evaluation              AM-PAC PT "6 Clicks" Daily Activity  Outcome Measure  Difficulty turning over in bed (including adjusting bedclothes, sheets and blankets)?: Unable Difficulty moving from lying on back to sitting on the side of the bed? : A Little Difficulty sitting down on and standing up from a  chair with arms (e.g., wheelchair, bedside commode, etc,.)?: A Little Help needed moving to and from a bed to chair (including a wheelchair)?: A Little Help needed walking in hospital room?: A Little Help needed climbing 3-5 steps with a railing? : A Little 6 Click Score: 16    End of Session Equipment Utilized During Treatment: Gait belt Activity Tolerance: Patient tolerated treatment well Patient left: in chair;with call bell/phone within reach;with family/visitor present   PT Visit Diagnosis: Pain;Difficulty in walking, not elsewhere classified (R26.2);Other abnormalities of gait and mobility (R26.89) Pain - Right/Left: Left Pain - part of body: Knee     Time: 4536-4680 PT Time Calculation (min) (ACUTE  ONLY): 24 min  Charges:  $Gait Training: 23-37 mins $Therapeutic Exercise: 8-22 mins                        Weston Anna, PT Acute Rehabilitation Services Pager: [phone] Office: [phone]

## 2017-12-29 NOTE — Plan of Care (Signed)
Care plan initiated and is not needed at this time

## 2017-12-30 ENCOUNTER — Encounter (HOSPITAL_COMMUNITY): Payer: Self-pay | Admitting: Orthopedic Surgery

## 2017-12-30 DIAGNOSIS — M25662 Stiffness of left knee, not elsewhere classified: Secondary | ICD-10-CM | POA: Diagnosis not present

## 2017-12-31 DIAGNOSIS — M25662 Stiffness of left knee, not elsewhere classified: Secondary | ICD-10-CM | POA: Insufficient documentation

## 2018-01-02 DIAGNOSIS — J4520 Mild intermittent asthma, uncomplicated: Secondary | ICD-10-CM | POA: Diagnosis not present

## 2018-01-02 NOTE — Discharge Summary (Signed)
Physician Discharge Summary   Patient ID: [name] MRN: [id number] DOB/AGE: 1955/05/20 62 y.o.  Admit date: 12/26/2017 Discharge date: 12/29/2017  Primary Diagnosis: Osteoarthritis, left knee   Admission Diagnoses:  Past Medical History:  Diagnosis Date  . Allergic rhinitis   . Anxiety   . Benign essential tremor    hands  . Bipolar 1 disorder, mixed, moderate ([place])   . Claustrophobia   . Depression   . Eczema   . Frequency of urination   . History of frequent urinary tract infections   . History of gastroesophageal reflux (GERD)    05-25-2017  per pt no issues since hiatal hernia repair 01/ 2018  . History of hiatal hernia   . History of melanoma excision    early 2000s-- BACK  . History of panic attacks   . 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 Hypothyroidism   . Intermittent palpitations    cardiology--  dr Martinique  . Interstitial cystitis   . Limited jaw range of motion    s/p  bilateral TMJ surgery,  age 9s  . Migraines   . Moderate persistent asthma    pulmologist-- dr Halford Chessman  . OA (osteoarthritis)    both knees  . OSA on CPAP    per last sleep study 09/ 2017  mild OSA, AHI13/hr  . PONV (postoperative nausea and vomiting)   . PVC (premature ventricular contraction)   . Rosacea   . Sensation of pressure in bladder area    per pt intermittant  . TMJ arthralgia   . Urticaria   . Wears glasses   . White coat syndrome without hypertension    05-25-2017  per pt hx hypertension yrs ago, no issues after quitting stressful job   Discharge Diagnoses:   Principal Problem:   OA (osteoarthritis) of knee  Estimated body mass index is 29.56 kg/m as calculated from the following:   Height as of this encounter: 5' (1.524 m).   Weight as of this encounter: 68.7 kg.  Procedure:  Procedure(s) (LRB): LEFT TOTAL KNEE ARTHROPLASTY (Left)   Consults: None  HPI: [name] is a 62 y.o. year old female with end stage OA of her left  knee with progressively worsening pain and dysfunction. She has constant pain, with activity and at rest and significant functional deficits with difficulties even with ADLs. She has had extensive non-op management including analgesics, injections of cortisone and viscosupplements, and home exercise program, but remains in significant pain with significant dysfunction. Radiographs show bone on bone arthritis medial and patellofemoral. She presents now for left Total Knee Arthroplasty.    Laboratory Data: Admission on 12/26/2017, Discharged on 12/29/2017  Component Date Value Ref Range Status  . WBC 12/27/2017 13.8* 4.0 - 10.5 K/uL Final  . RBC 12/27/2017 3.67* 3.87 - 5.11 MIL/uL Final  . Hemoglobin 12/27/2017 11.3* 12.0 - 15.0 g/dL Final  . HCT 12/27/2017 35.6* 36.0 - 46.0 % Final  . MCV 12/27/2017 97.0  80.0 - 100.0 fL Final  . MCH 12/27/2017 30.8  26.0 - 34.0 pg Final  . MCHC 12/27/2017 31.7  30.0 - 36.0 g/dL Final  . RDW 12/27/2017 13.3  11.5 - 15.5 % Final  . Platelets 12/27/2017 190  150 - 400 K/uL Final  . nRBC 12/27/2017 0.0  0.0 - 0.2 % Final   Performed at [facility], [place] [address] [place], [place] [address]  . Sodium 12/27/2017 140  135 -  145 mmol/L Final  . Potassium 12/27/2017 3.3* 3.5 - 5.1 mmol/L Final  . Chloride 12/27/2017 102  98 - 111 mmol/L Final  . CO2 12/27/2017 27  22 - 32 mmol/L Final  . Glucose, Bld 12/27/2017 154* 70 - 99 mg/dL Final  . BUN 12/27/2017 11  8 - 23 mg/dL Final  . Creatinine, Ser 12/27/2017 0.95  0.44 - 1.00 mg/dL Final  . Calcium 12/27/2017 9.1  8.9 - 10.3 mg/dL Final  . GFR calc non Af Amer 12/27/2017 >60  >60 mL/min Final  . GFR calc Af Amer 12/27/2017 >60  >60 mL/min Final   Comment: (NOTE) The eGFR has been calculated using the CKD EPI equation. This calculation has not been validated in all clinical situations. eGFR's persistently <60 mL/min signify possible Chronic Kidney Disease.   Georgiann Hahn gap 12/27/2017 11  5 - 15  Final   Performed at [facility], [place] [address], [place], [place] [address]  . WBC 12/28/2017 16.4* 4.0 - 10.5 K/uL Final  . RBC 12/28/2017 3.67* 3.87 - 5.11 MIL/uL Final  . Hemoglobin 12/28/2017 11.3* 12.0 - 15.0 g/dL Final  . HCT 12/28/2017 34.8* 36.0 - 46.0 % Final  . MCV 12/28/2017 94.8  80.0 - 100.0 fL Final  . MCH 12/28/2017 30.8  26.0 - 34.0 pg Final  . MCHC 12/28/2017 32.5  30.0 - 36.0 g/dL Final  . RDW 12/28/2017 13.4  11.5 - 15.5 % Final  . Platelets 12/28/2017 193  150 - 400 K/uL Final  . nRBC 12/28/2017 0.0  0.0 - 0.2 % Final   Performed at [facility], [place] [address] [place], [place] [address]  . Sodium 12/28/2017 136  135 - 145 mmol/L Final  . Potassium 12/28/2017 3.7  3.5 - 5.1 mmol/L Final  . Chloride 12/28/2017 100  98 - 111 mmol/L Final  . CO2 12/28/2017 25  22 - 32 mmol/L Final  . Glucose, Bld 12/28/2017 147* 70 - 99 mg/dL Final  . BUN 12/28/2017 11  8 - 23 mg/dL Final  . Creatinine, Ser 12/28/2017 0.79  0.44 - 1.00 mg/dL Final  . Calcium 12/28/2017 9.1  8.9 - 10.3 mg/dL Final  . GFR calc non Af Amer 12/28/2017 >60  >60 mL/min Final  . GFR calc Af Amer 12/28/2017 >60  >60 mL/min Final   Comment: (NOTE) The eGFR has been calculated using the CKD EPI equation. This calculation has not been validated in all clinical situations. eGFR's persistently <60 mL/min signify possible Chronic Kidney Disease.   Georgiann Hahn gap 12/28/2017 11  5 - 15 Final   Performed at [facility], [place] [address] [place], [place] [address]  . WBC 12/29/2017 12.2* 4.0 - 10.5 K/uL Final  . RBC 12/29/2017 3.29* 3.87 - 5.11 MIL/uL Final  . Hemoglobin 12/29/2017 10.0* 12.0 - 15.0 g/dL Final  . HCT 12/29/2017 31.2* 36.0 - 46.0 % Final  . MCV 12/29/2017 94.8  80.0 - 100.0 fL Final  . MCH 12/29/2017 30.4  26.0 - 34.0 pg Final  . MCHC 12/29/2017 32.1  30.0 - 36.0 g/dL Final  . RDW 12/29/2017 13.4  11.5 - 15.5 % Final  . Platelets 12/29/2017  162  150 - 400 K/uL Final  . nRBC 12/29/2017 0.0  0.0 - 0.2 % Final   Performed at [facility], [place] [address], [place], [place] [address]  Hospital Outpatient Visit on 12/21/2017  Component Date Value Ref Range Status  . aPTT 12/21/2017  28  24 - 36 seconds Final   Performed at [facility], [place] [address], [place], [place] [address]  . WBC 12/21/2017 11.0* 4.0 - 10.5 K/uL Final  . RBC 12/21/2017 4.71  3.87 - 5.11 MIL/uL Final  . Hemoglobin 12/21/2017 14.2  12.0 - 15.0 g/dL Final  . HCT 12/21/2017 43.9  36.0 - 46.0 % Final  . MCV 12/21/2017 93.2  80.0 - 100.0 fL Final  . MCH 12/21/2017 30.1  26.0 - 34.0 pg Final  . MCHC 12/21/2017 32.3  30.0 - 36.0 g/dL Final  . RDW 12/21/2017 13.2  11.5 - 15.5 % Final  . Platelets 12/21/2017 231  150 - 400 K/uL Final  . nRBC 12/21/2017 0.0  0.0 - 0.2 % Final   Performed at [facility], [place] [address], [place], [place] [address]  . Sodium 12/21/2017 139  135 - 145 mmol/L Final  . Potassium 12/21/2017 4.1  3.5 - 5.1 mmol/L Final  . Chloride 12/21/2017 101  98 - 111 mmol/L Final  . CO2 12/21/2017 26  22 - 32 mmol/L Final  . Glucose, Bld 12/21/2017 125* 70 - 99 mg/dL Final  . BUN 12/21/2017 9  8 - 23 mg/dL Final  . Creatinine, Ser 12/21/2017 0.88  0.44 - 1.00 mg/dL Final  . Calcium 12/21/2017 9.6  8.9 - 10.3 mg/dL Final  . Total Protein 12/21/2017 6.9  6.5 - 8.1 g/dL Final  . Albumin 12/21/2017 4.4  3.5 - 5.0 g/dL Final  . AST 12/21/2017 37  15 - 41 U/L Final  . ALT 12/21/2017 38  0 - 44 U/L Final  . Alkaline Phosphatase 12/21/2017 85  38 - 126 U/L Final  . Total Bilirubin 12/21/2017 0.8  0.3 - 1.2 mg/dL Final  . GFR calc non Af Amer 12/21/2017 >60  >60 mL/min Final  . GFR calc Af Amer 12/21/2017 >60  >60 mL/min Final   Comment: (NOTE) The eGFR has been calculated using the CKD EPI equation. This calculation has not been validated in all clinical situations. eGFR's persistently <60 mL/min  signify possible Chronic Kidney Disease.   Georgiann Hahn gap 12/21/2017 12  5 - 15 Final   Performed at [facility], [place] [address], [place], [place] [address]  . Prothrombin Time 12/21/2017 12.5  11.4 - 15.2 seconds Final  . INR 12/21/2017 0.94   Final   Performed at [facility], [place] [address], [place], [place] [address]  . ABO/RH(D) 12/21/2017 A POS   Final  . Antibody Screen 12/21/2017 NEG   Final  . Sample Expiration 12/21/2017 12/29/2017   Final  . Extend sample reason 12/21/2017    Final                   Value:NO TRANSFUSIONS OR PREGNANCY IN THE PAST 3 MONTHS Performed at [facility], [place] [address], [place], [place] [address]   . MRSA, PCR 12/21/2017 NEGATIVE  NEGATIVE Final  . Staphylococcus aureus 12/21/2017 NEGATIVE  NEGATIVE Final   Comment: (NOTE) The Xpert SA Assay (FDA approved for NASAL specimens in patients 26 years of age and older), is one component of a comprehensive surveillance program. It is not intended to diagnose infection nor to guide or monitor treatment. Performed at [facility], [place] [address] [place], [place] [address]   Office Visit on 11/21/2017  Component Date Value Ref Range Status  . Color, Urine 11/21/2017 YELLOW  Yellow;Lt. Yellow Final  .  APPearance 11/21/2017 CLEAR  Clear Final  . Specific Gravity, Urine 11/21/2017 <=1.005* 1.000 - 1.030 Final  . pH 11/21/2017 6.5  5.0 - 8.0 Final  . Total Protein, Urine 11/21/2017 NEGATIVE  Negative Final  . Urine Glucose 11/21/2017 NEGATIVE  Negative Final  . Ketones, ur 11/21/2017 NEGATIVE  Negative Final  . Bilirubin Urine 11/21/2017 NEGATIVE  Negative Final  . Hgb urine dipstick 11/21/2017 NEGATIVE  Negative Final  . Urobilinogen, UA 11/21/2017 0.2  0.0 - 1.0 Final  . Leukocytes, UA 11/21/2017 NEGATIVE  Negative Final  . Nitrite 11/21/2017 NEGATIVE  Negative Final     X-Rays:No results found.  EKG: Orders placed or  performed during the hospital encounter of 12/26/17  . EKG 12-Lead  . EKG 12-Lead     Hospital Course: [name] is a 62 y.o. who was admitted to [facility] They were brought to the operating room on 12/26/2017 and underwent Procedure(s): LEFT TOTAL KNEE ARTHROPLASTY.  Patient tolerated the procedure well and was later transferred to the recovery room and then to the orthopaedic floor for postoperative care. They were given PO and IV analgesics for pain control following their surgery. They were given 24 hours of postoperative antibiotics of  Anti-infectives (From admission, onward)   Start     Dose/Rate Route Frequency Ordered Stop   12/26/17 1400  ceFAZolin (ANCEF) IVPB 1 g/50 mL premix     1 g 100 mL/hr over 30 Minutes Intravenous Every 6 hours 12/26/17 1206 12/26/17 2100   12/26/17 0645  ceFAZolin (ANCEF) IVPB 2g/100 mL premix     2 g 200 mL/hr over 30 Minutes Intravenous On call to O.R. 12/26/17 [id number] 12/26/17 0819     and started on DVT prophylaxis in the form of Xarelto.   PT and OT were ordered for total joint protocol. Discharge planning consulted to help with postop disposition and equipment needs. Patient had a decent night on the evening of surgery. They started to get up OOB with therapy on POD #0. Hemovac drain was pulled without difficulty on day one. Potassium was low at 3.3, one dose of 40 mEq KCl ordered. Continued to work with therapy into POD #2. On day two and dressing was changed and the incision was clean, dry, and intact with no drainage. Later that afternoon, pt had episode of tachycardia, with heart rate fluctuating from 90s-120s. Pt with history of tachycardia and occasional PVCs for which she sees Betty Martinique, PCP. Pt was asymptomatic, and EKG showed tachycardia with occasional PVCs, which was not new to patient's history. No further action was taken. Oxygen saturation was 98% with BP stable. Pt was seen on POD #3 and was ready to go home. HR was stable.  Worked with therapy for two additional sessions and was meeting their goals. She was discharged to home later that day in stable condition.  Diet: Cardiac diet Activity: WBAT Follow-up: in 2 weeks with Dr. Wynelle Link Disposition: Home with outpatient PT at [facility] Discharged Condition: stable   Discharge Instructions    Call MD / Call 911   Complete by:  As directed    If you experience chest pain or shortness of breath, CALL 911 and be transported to the hospital emergency room.  If you develope a fever above 101 F, pus (white drainage) or increased drainage or redness at the wound, or calf pain, call your surgeon's office.   Change dressing   Complete by:  As directed    Change the dressing daily  with sterile 4 x 4 inch gauze dressing and apply TED hose.   Constipation Prevention   Complete by:  As directed    Drink plenty of fluids.  Prune juice may be helpful.  You may use a stool softener, such as Colace (over the counter) 100 mg twice a day.  Use MiraLax (over the counter) for constipation as needed.   Diet - low sodium heart healthy   Complete by:  As directed    Discharge instructions   Complete by:  As directed    Dr. Gaynelle Arabian Total Joint Specialist [facility] [address] [place], [place] [address] [phone]  TOTAL KNEE REPLACEMENT POSTOPERATIVE DIRECTIONS  Knee Rehabilitation, Guidelines Following Surgery  Results after knee surgery are often greatly improved when you follow the exercise, range of motion and muscle strengthening exercises prescribed by your doctor. Safety measures are also important to protect the knee from further injury. Any time any of these exercises cause you to have increased pain or swelling in your knee joint, decrease the amount until you are comfortable again and slowly increase them. If you have problems or questions, call your caregiver or physical therapist for advice.   HOME CARE INSTRUCTIONS  Remove items at home  which could result in a fall. This includes throw rugs or furniture in walking pathways.  ICE to the affected knee every three hours for 30 minutes at a time and then as needed for pain and swelling.  Continue to use ice on the knee for pain and swelling from surgery. You may notice swelling that will progress down to the foot and ankle.  This is normal after surgery.  Elevate the leg when you are not up walking on it.   Continue to use the breathing machine which will help keep your temperature down.  It is common for your temperature to cycle up and down following surgery, especially at night when you are not up moving around and exerting yourself.  The breathing machine keeps your lungs expanded and your temperature down. Do not place pillow under knee, focus on keeping the knee straight while resting   DIET You may resume your previous home diet once your are discharged from the hospital.  DRESSING / WOUND CARE / SHOWERING You may shower 3 days after surgery, but keep the wounds dry during showering.  You may use an occlusive plastic wrap (Press'n Seal for example), NO SOAKING/SUBMERGING IN THE BATHTUB.  If the bandage gets wet, change with a clean dry gauze.  If the incision gets wet, pat the wound dry with a clean towel. You may start showering once you are discharged home but do not submerge the incision under water. Just pat the incision dry and apply a dry gauze dressing on daily. Change the surgical dressing daily and reapply a dry dressing each time.  ACTIVITY Walk with your walker as instructed. Use walker as long as suggested by your caregivers. Avoid periods of inactivity such as sitting longer than an hour when not asleep. This helps prevent blood clots.  You may resume a sexual relationship in one month or when given the OK by your doctor.  You may return to work once you are cleared by your doctor.  Do not drive a car for 6 weeks or until released by you surgeon.  Do not drive  while taking narcotics.  WEIGHT BEARING Weight bearing as tolerated with assist device (walker, cane, etc) as directed, use it as long as  suggested by your surgeon or therapist, typically at least 4-6 weeks.  POSTOPERATIVE CONSTIPATION PROTOCOL Constipation - defined medically as fewer than three stools per week and severe constipation as less than one stool per week.  One of the most common issues patients have following surgery is constipation.  Even if you have a regular bowel pattern at home, your normal regimen is likely to be disrupted due to multiple reasons following surgery.  Combination of anesthesia, postoperative narcotics, change in appetite and fluid intake all can affect your bowels.  In order to avoid complications following surgery, here are some recommendations in order to help you during your recovery period.  Colace (docusate) - Pick up an over-the-counter form of Colace or another stool softener and take twice a day as long as you are requiring postoperative pain medications.  Take with a full glass of water daily.  If you experience loose stools or diarrhea, hold the colace until you stool forms back up.  If your symptoms do not get better within 1 week or if they get worse, check with your doctor.  Dulcolax (bisacodyl) - Pick up over-the-counter and take as directed by the product packaging as needed to assist with the movement of your bowels.  Take with a full glass of water.  Use this product as needed if not relieved by Colace only.   MiraLax (polyethylene glycol) - Pick up over-the-counter to have on hand.  MiraLax is a solution that will increase the amount of water in your bowels to assist with bowel movements.  Take as directed and can mix with a glass of water, juice, soda, coffee, or tea.  Take if you go more than two days without a movement. Do not use MiraLax more than once per day. Call your doctor if you are still constipated or irregular after using this medication  for 7 days in a row.  If you continue to have problems with postoperative constipation, please contact the office for further assistance and recommendations.  If you experience "the worst abdominal pain ever" or develop nausea or vomiting, please contact the office immediatly for further recommendations for treatment.  ITCHING  If you experience itching with your medications, try taking only a single pain pill, or even half a pain pill at a time.  You can also use Benadryl over the counter for itching or also to help with sleep.   TED HOSE STOCKINGS Wear the elastic stockings on both legs for three weeks following surgery during the day but you may remove then at night for sleeping.  MEDICATIONS See your medication summary on the "After Visit Summary" that the nursing staff will review with you prior to discharge.  You may have some home medications which will be placed on hold until you complete the course of blood thinner medication.  It is important for you to complete the blood thinner medication as prescribed by your surgeon.  Continue your approved medications as instructed at time of discharge.  PRECAUTIONS If you experience chest pain or shortness of breath - call 911 immediately for transfer to the hospital emergency department.  If you develop a fever greater that 101 F, purulent drainage from wound, increased redness or drainage from wound, foul odor from the wound/dressing, or calf pain - CONTACT YOUR SURGEON.  FOLLOW-UP APPOINTMENTS Make sure you keep all of your appointments after your operation with your surgeon and caregivers. You should call the office at the above phone number and make an appointment for approximately two weeks after the date of your surgery or on the date instructed by your surgeon outlined in the "After Visit Summary".   RANGE OF MOTION AND STRENGTHENING EXERCISES  Rehabilitation of the knee is important  following a knee injury or an operation. After just a few days of immobilization, the muscles of the thigh which control the knee become weakened and shrink (atrophy). Knee exercises are designed to build up the tone and strength of the thigh muscles and to improve knee motion. Often times heat used for twenty to thirty minutes before working out will loosen up your tissues and help with improving the range of motion but do not use heat for the first two weeks following surgery. These exercises can be done on a training (exercise) mat, on the floor, on a table or on a bed. Use what ever works the best and is most comfortable for you Knee exercises include:  Leg Lifts - While your knee is still immobilized in a splint or cast, you can do straight leg raises. Lift the leg to 60 degrees, hold for 3 sec, and slowly lower the leg. Repeat 10-20 times 2-3 times daily. Perform this exercise against resistance later as your knee gets better.  Quad and Hamstring Sets - Tighten up the muscle on the front of the thigh (Quad) and hold for 5-10 sec. Repeat this 10-20 times hourly. Hamstring sets are done by pushing the foot backward against an object and holding for 5-10 sec. Repeat as with quad sets.  Leg Slides: Lying on your back, slowly slide your foot toward your buttocks, bending your knee up off the floor (only go as far as is comfortable). Then slowly slide your foot back down until your leg is flat on the floor again. Angel Wings: Lying on your back spread your legs to the side as far apart as you can without causing discomfort.  A rehabilitation program following serious knee injuries can speed recovery and prevent re-injury in the future due to weakened muscles. Contact your doctor or a physical therapist for more information on knee rehabilitation.   IF YOU ARE TRANSFERRED TO A SKILLED REHAB FACILITY If the patient is transferred to a skilled rehab facility following release from the hospital, a list of the  current medications will be sent to the facility for the patient to continue.  When discharged from the skilled rehab facility, please have the facility set up the patient's [place] prior to being released. Also, the skilled facility will be responsible for providing the patient with their medications at time of release from the facility to include their pain medication, the muscle relaxants, and their blood thinner medication. If the patient is still at the rehab facility at time of the two week follow up appointment, the skilled rehab facility will also need to assist the patient in arranging follow up appointment in our office and any transportation needs.  MAKE SURE YOU:  Understand these instructions.  Get help right away if you are not doing well or get worse.    Pick up stool softner and laxative for home use following surgery while on pain medications. Do not submerge incision under water. Please use good hand washing techniques while changing dressing each day. May shower starting three days after surgery.  Please use a clean towel to pat the incision dry following showers. Continue to use ice for pain and swelling after surgery. Do not use any lotions or creams on the incision until instructed by your surgeon.   Do not put a pillow under the knee. Place it under the heel.   Complete by:  As directed    Driving restrictions   Complete by:  As directed    No driving for two weeks   TED hose   Complete by:  As directed    Use stockings (TED hose) for three weeks on both leg(s).  You may remove them at night for sleeping.   Weight bearing as tolerated   Complete by:  As directed      Allergies as of 12/29/2017      Reactions   Indomethacin Other (See Comments)   Muscle spasms Causes muscle spasms in neck   Amlodipine Swelling   Peripheral edema   Pseudoephedrine Other (See Comments)   I fly off the walls  CANNOT SLEEP   Risperidone And Related Other (See  Comments)   Stomach upset, insomnia, drooling, tremors, "jerks," sensitivity to touch.    Bupivacaine Hives   Pentazocine Other (See Comments)   Unknown reaction MADE ME "LACTATE"   Propoxyphene Itching, Nausea And Vomiting   darvocet   Sulfa Antibiotics Other (See Comments)   Unknown reaction   Tramadol Other (See Comments)   Patient can not remember   Aspirin Other (See Comments)   upset stomach, ringing in the ears   Codeine Itching, Other (See Comments)   Anything related to codeine   Etodolac Rash, Other (See Comments)   Ivp Dye [iodinated Diagnostic Agents] Itching   Flushed and Fever, itch all over   Propranolol Nausea Only, Other (See Comments)      Medication List    STOP taking these medications   Vitamin D3 125 MCG (5000 UT) Caps     TAKE these medications   albuterol 108 (90 Base) MCG/ACT inhaler Commonly known as:  PROVENTIL HFA;VENTOLIN HFA Inhale 1-2 puffs into the lungs daily as needed for wheezing or shortness of breath. What changed:  Another medication with the same name was changed. Make sure you understand how and when to take each.   albuterol (2.5 MG/3ML) 0.083% nebulizer solution Commonly known as:  PROVENTIL TAKE 3 MLS BY NEBULIZATION EVERY 6 HOURS AS NEEDED FOR WHEEZING OR SHORTNESS OF BREATH. What changed:  See the new instructions.   alendronate 70 MG tablet Commonly known as:  FOSAMAX TAKE 1 TABLET BY MOUTH EVERY 7 DAYS WITH FULL GLASS OF WATER ON EMPTY STOMACH What changed:  See the new instructions.   amitriptyline 10 MG tablet Commonly known as:  ELAVIL Take 10 mg by mouth at bedtime.   anti-nausea solution Take 30 mLs by mouth every 15 (fifteen) minutes as needed for nausea or vomiting.   Azelastine HCl 0.15 % Soln PLACE 2 SPRAYS INTO THE NOSE DAILY AS NEEDED.   Carbinoxamine Maleate 4 MG Tabs TAKE 1 TABLET BY MOUTH EVERY 8 HOURS AS NEEDED What changed:  reasons to take this   ELMIRON 100 MG capsule Generic drug:  pentosan  polysulfate Take 100 mg by mouth 2 (two) times daily.   estradiol 0.1 MG/GM vaginal cream Commonly known as:  ESTRACE Place 1 Applicatorful vaginally once a week.   fluticasone 220 MCG/ACT inhaler Commonly known as:  FLOVENT HFA TAKE 2 PUFFS BY MOUTH TWICE A DAY   gabapentin 300  MG capsule Commonly known as:  NEURONTIN Take 300 mg by mouth 3 (three) times daily.   hydrocortisone cream 1 % Apply 1 application topically 2 (two) times daily as needed for itching.   hydrOXYzine 25 MG tablet Commonly known as:  ATARAX/VISTARIL Take 25 mg by mouth 2 (two) times daily.   ketoconazole 2 % shampoo Commonly known as:  NIZORAL Apply 1 application topically 2 (two) times a week.   levothyroxine 50 MCG tablet Commonly known as:  SYNTHROID, LEVOTHROID TAKE 1 TABLET EVERY DAY--- takes in am What changed:    how much to take  how to take this  when to take this  additional instructions   loperamide 2 MG tablet Commonly known as:  IMODIUM A-D Take 2-4 mg by mouth 4 (four) times daily as needed for diarrhea or loose stools.   methocarbamol 500 MG tablet Commonly known as:  ROBAXIN Take 1 tablet (500 mg total) by mouth every 6 (six) hours as needed for muscle spasms.   mometasone 50 MCG/ACT nasal spray Commonly known as:  NASONEX Place 2 sprays into the nose every morning.   montelukast 10 MG tablet Commonly known as:  SINGULAIR Take 1 tablet (10 mg total) by mouth at bedtime.   olopatadine 0.1 % ophthalmic solution Commonly known as:  PATANOL Place 1 drop into both eyes 2 (two) times daily as needed for allergies.   oxyCODONE 5 MG immediate release tablet Commonly known as:  Oxy IR/ROXICODONE Take 1-2 tablets (5-10 mg total) by mouth every 6 (six) hours as needed for moderate pain (pain score 4-6).   primidone 50 MG tablet Commonly known as:  MYSOLINE Take 1 tablet (50 mg total) by mouth 2 (two) times daily.   rivaroxaban 10 MG Tabs tablet Commonly known as:   XARELTO Take 1 tablet (10 mg total) by mouth daily with breakfast for 19 days. Take one 10 mg xarelto once a day for three weeks following surgery to prevent blood clots.   rizatriptan 10 MG disintegrating tablet Commonly known as:  MAXALT-MLT TAKE 1 TABLET EVERY DAY AS NEEDED FOR MIGRAINE, MAY REPEAT IN 2 HRS IF NEEDED. MAX 2 DOSES/WK What changed:  See the new instructions.   rosuvastatin 20 MG tablet Commonly known as:  CRESTOR TAKE 1 TABLET BY MOUTH EVERY DAY What changed:  when to take this   sertraline 100 MG tablet Commonly known as:  ZOLOFT Take 100 mg by mouth at bedtime.   Tiotropium Bromide Monohydrate 1.25 MCG/ACT Aers Inhale 2 puffs into the lungs daily. What changed:  when to take this   umeclidinium bromide 62.5 MCG/INH Aepb Commonly known as:  INCRUSE ELLIPTA Inhale 1 puff into the lungs daily.   ziprasidone 40 MG capsule Commonly known as:  GEODON Take 40 mg by mouth 2 (two) times daily. Morning & with supper   Zoster Vaccine Adjuvanted injection Commonly known as:  SHINGRIX 0.5 ml in muscle and repeat in 8 weeks            Discharge Care Instructions  (From admission, onward)         Start     Ordered   12/27/17 0000  Weight bearing as tolerated     12/27/17 0712   12/27/17 0000  Change dressing    Comments:  Change the dressing daily with sterile 4 x 4 inch gauze dressing and apply TED hose.   12/27/17 4356         Follow-up Information    [facility]  [place]. Go on 12/30/2017.   Why:  You have a Physical Therapy appointment on 12-30-17 at 3:00 pm with Freddi Che at [facility].       Gaynelle Arabian, MD. Go on 01/10/2018.   Specialty:  Orthopedic Surgery Why:  You have an appointment with Dr. Wynelle Link on 01-10-18 at 1:00 pm Contact information: [address] [place] [address] [phone]           Signed: Theresa Duty, PA-C Orthopedic Surgery 01/02/2018, 3:42 PM

## 2018-01-03 DIAGNOSIS — M25662 Stiffness of left knee, not elsewhere classified: Secondary | ICD-10-CM | POA: Diagnosis not present

## 2018-01-09 DIAGNOSIS — G4733 Obstructive sleep apnea (adult) (pediatric): Secondary | ICD-10-CM | POA: Diagnosis not present

## 2018-01-10 DIAGNOSIS — Z471 Aftercare following joint replacement surgery: Secondary | ICD-10-CM | POA: Insufficient documentation

## 2018-01-10 DIAGNOSIS — M25662 Stiffness of left knee, not elsewhere classified: Secondary | ICD-10-CM | POA: Diagnosis not present

## 2018-01-12 ENCOUNTER — Other Ambulatory Visit: Payer: Self-pay | Admitting: Neurology

## 2018-01-12 MED ORDER — PRIMIDONE 50 MG PO TABS: 50.0000 mg | ORAL_TABLET | Freq: Two times a day (BID) | ORAL | 2 refills | Status: DC

## 2018-01-13 DIAGNOSIS — M25662 Stiffness of left knee, not elsewhere classified: Secondary | ICD-10-CM | POA: Diagnosis not present

## 2018-01-15 DIAGNOSIS — R110 Nausea: Secondary | ICD-10-CM | POA: Diagnosis not present

## 2018-01-15 DIAGNOSIS — R52 Pain, unspecified: Secondary | ICD-10-CM | POA: Diagnosis not present

## 2018-01-15 DIAGNOSIS — I10 Essential (primary) hypertension: Secondary | ICD-10-CM | POA: Diagnosis not present

## 2018-01-15 DIAGNOSIS — R531 Weakness: Secondary | ICD-10-CM | POA: Diagnosis not present

## 2018-01-16 ENCOUNTER — Emergency Department (HOSPITAL_COMMUNITY): Payer: Medicare HMO

## 2018-01-16 ENCOUNTER — Other Ambulatory Visit: Payer: Self-pay

## 2018-01-16 ENCOUNTER — Encounter (HOSPITAL_COMMUNITY): Payer: Self-pay

## 2018-01-16 ENCOUNTER — Inpatient Hospital Stay (HOSPITAL_COMMUNITY)
Admission: EM | Admit: 2018-01-16 | Discharge: 2018-01-24 | DRG: 057 | Disposition: A | Discharge status: Skilled Nursing Facility | Payer: Medicare HMO | Attending: Internal Medicine | Admitting: Internal Medicine

## 2018-01-16 DIAGNOSIS — F3111 Bipolar disorder, current episode manic without psychotic features, mild: Secondary | ICD-10-CM | POA: Diagnosis not present

## 2018-01-16 DIAGNOSIS — Z85820 Personal history of malignant melanoma of skin: Secondary | ICD-10-CM | POA: Diagnosis not present

## 2018-01-16 DIAGNOSIS — R51 Headache: Secondary | ICD-10-CM | POA: Diagnosis not present

## 2018-01-16 DIAGNOSIS — Z886 Allergy status to analgesic agent status: Secondary | ICD-10-CM

## 2018-01-16 DIAGNOSIS — F40240 Claustrophobia: Secondary | ICD-10-CM | POA: Diagnosis present

## 2018-01-16 DIAGNOSIS — K449 Diaphragmatic hernia without obstruction or gangrene: Secondary | ICD-10-CM | POA: Diagnosis not present

## 2018-01-16 DIAGNOSIS — Z885 Allergy status to narcotic agent status: Secondary | ICD-10-CM

## 2018-01-16 DIAGNOSIS — R197 Diarrhea, unspecified: Secondary | ICD-10-CM | POA: Diagnosis not present

## 2018-01-16 DIAGNOSIS — F3162 Bipolar disorder, current episode mixed, moderate: Secondary | ICD-10-CM | POA: Diagnosis present

## 2018-01-16 DIAGNOSIS — F329 Major depressive disorder, single episode, unspecified: Secondary | ICD-10-CM | POA: Diagnosis not present

## 2018-01-16 DIAGNOSIS — E785 Hyperlipidemia, unspecified: Secondary | ICD-10-CM | POA: Diagnosis not present

## 2018-01-16 DIAGNOSIS — R531 Weakness: Secondary | ICD-10-CM

## 2018-01-16 DIAGNOSIS — J449 Chronic obstructive pulmonary disease, unspecified: Secondary | ICD-10-CM | POA: Diagnosis present

## 2018-01-16 DIAGNOSIS — R262 Difficulty in walking, not elsewhere classified: Secondary | ICD-10-CM | POA: Diagnosis not present

## 2018-01-16 DIAGNOSIS — Z973 Presence of spectacles and contact lenses: Secondary | ICD-10-CM | POA: Diagnosis not present

## 2018-01-16 DIAGNOSIS — F319 Bipolar disorder, unspecified: Secondary | ICD-10-CM

## 2018-01-16 DIAGNOSIS — G2579 Other drug induced movement disorders: Secondary | ICD-10-CM | POA: Diagnosis present

## 2018-01-16 DIAGNOSIS — Z9049 Acquired absence of other specified parts of digestive tract: Secondary | ICD-10-CM | POA: Diagnosis not present

## 2018-01-16 DIAGNOSIS — M2550 Pain in unspecified joint: Secondary | ICD-10-CM | POA: Diagnosis not present

## 2018-01-16 DIAGNOSIS — Z7401 Bed confinement status: Secondary | ICD-10-CM | POA: Diagnosis not present

## 2018-01-16 DIAGNOSIS — G250 Essential tremor: Secondary | ICD-10-CM | POA: Diagnosis present

## 2018-01-16 DIAGNOSIS — Z7901 Long term (current) use of anticoagulants: Secondary | ICD-10-CM

## 2018-01-16 DIAGNOSIS — R2689 Other abnormalities of gait and mobility: Secondary | ICD-10-CM | POA: Diagnosis not present

## 2018-01-16 DIAGNOSIS — Z87440 Personal history of urinary (tract) infections: Secondary | ICD-10-CM | POA: Diagnosis not present

## 2018-01-16 DIAGNOSIS — G2589 Other specified extrapyramidal and movement disorders: Principal | ICD-10-CM | POA: Diagnosis present

## 2018-01-16 DIAGNOSIS — F410 Panic disorder [episodic paroxysmal anxiety] without agoraphobia: Secondary | ICD-10-CM | POA: Diagnosis present

## 2018-01-16 DIAGNOSIS — K219 Gastro-esophageal reflux disease without esophagitis: Secondary | ICD-10-CM | POA: Diagnosis present

## 2018-01-16 DIAGNOSIS — G20 Parkinson's disease: Secondary | ICD-10-CM | POA: Diagnosis not present

## 2018-01-16 DIAGNOSIS — R69 Illness, unspecified: Secondary | ICD-10-CM | POA: Diagnosis not present

## 2018-01-16 DIAGNOSIS — R1311 Dysphagia, oral phase: Secondary | ICD-10-CM | POA: Diagnosis not present

## 2018-01-16 DIAGNOSIS — Z888 Allergy status to other drugs, medicaments and biological substances status: Secondary | ICD-10-CM

## 2018-01-16 DIAGNOSIS — F419 Anxiety disorder, unspecified: Secondary | ICD-10-CM | POA: Diagnosis present

## 2018-01-16 DIAGNOSIS — M6281 Muscle weakness (generalized): Secondary | ICD-10-CM | POA: Diagnosis not present

## 2018-01-16 DIAGNOSIS — Z96652 Presence of left artificial knee joint: Secondary | ICD-10-CM | POA: Diagnosis present

## 2018-01-16 DIAGNOSIS — J309 Allergic rhinitis, unspecified: Secondary | ICD-10-CM | POA: Diagnosis present

## 2018-01-16 DIAGNOSIS — E860 Dehydration: Secondary | ICD-10-CM | POA: Diagnosis not present

## 2018-01-16 DIAGNOSIS — R05 Cough: Secondary | ICD-10-CM | POA: Diagnosis not present

## 2018-01-16 DIAGNOSIS — G4733 Obstructive sleep apnea (adult) (pediatric): Secondary | ICD-10-CM | POA: Diagnosis present

## 2018-01-16 DIAGNOSIS — J4540 Moderate persistent asthma, uncomplicated: Secondary | ICD-10-CM | POA: Diagnosis present

## 2018-01-16 DIAGNOSIS — Z8349 Family history of other endocrine, nutritional and metabolic diseases: Secondary | ICD-10-CM

## 2018-01-16 DIAGNOSIS — Z91041 Radiographic dye allergy status: Secondary | ICD-10-CM

## 2018-01-16 DIAGNOSIS — N390 Urinary tract infection, site not specified: Secondary | ICD-10-CM | POA: Diagnosis not present

## 2018-01-16 DIAGNOSIS — I10 Essential (primary) hypertension: Secondary | ICD-10-CM | POA: Diagnosis not present

## 2018-01-16 DIAGNOSIS — Z8249 Family history of ischemic heart disease and other diseases of the circulatory system: Secondary | ICD-10-CM

## 2018-01-16 DIAGNOSIS — G2119 Other drug induced secondary parkinsonism: Secondary | ICD-10-CM | POA: Diagnosis not present

## 2018-01-16 DIAGNOSIS — E039 Hypothyroidism, unspecified: Secondary | ICD-10-CM | POA: Diagnosis present

## 2018-01-16 DIAGNOSIS — T43595A Adverse effect of other antipsychotics and neuroleptics, initial encounter: Secondary | ICD-10-CM | POA: Diagnosis present

## 2018-01-16 DIAGNOSIS — Z79890 Hormone replacement therapy (postmenopausal): Secondary | ICD-10-CM

## 2018-01-16 DIAGNOSIS — Z79899 Other long term (current) drug therapy: Secondary | ICD-10-CM

## 2018-01-16 DIAGNOSIS — Z882 Allergy status to sulfonamides status: Secondary | ICD-10-CM

## 2018-01-16 DIAGNOSIS — M62838 Other muscle spasm: Secondary | ICD-10-CM | POA: Diagnosis not present

## 2018-01-16 DIAGNOSIS — G2111 Neuroleptic induced parkinsonism: Secondary | ICD-10-CM | POA: Diagnosis not present

## 2018-01-16 DIAGNOSIS — Z7951 Long term (current) use of inhaled steroids: Secondary | ICD-10-CM

## 2018-01-16 DIAGNOSIS — R278 Other lack of coordination: Secondary | ICD-10-CM | POA: Diagnosis not present

## 2018-01-16 DIAGNOSIS — N3000 Acute cystitis without hematuria: Secondary | ICD-10-CM | POA: Diagnosis not present

## 2018-01-16 LAB — CBC WITH DIFFERENTIAL/PLATELET
Abs Immature Granulocytes: 0.07 10*3/uL (ref 0.00–0.07)
Basophils Absolute: 0 10*3/uL (ref 0.0–0.1)
Basophils Relative: 0 %
Eosinophils Absolute: 0.1 10*3/uL (ref 0.0–0.5)
Eosinophils Relative: 1 %
HEMATOCRIT: 34.1 % — AB (ref 36.0–46.0)
HEMOGLOBIN: 10.8 g/dL — AB (ref 12.0–15.0)
IMMATURE GRANULOCYTES: 1 %
LYMPHS ABS: 2.6 10*3/uL (ref 0.7–4.0)
LYMPHS PCT: 20 %
MCH: 30 pg (ref 26.0–34.0)
MCHC: 31.7 g/dL (ref 30.0–36.0)
MCV: 94.7 fL (ref 80.0–100.0)
Monocytes Absolute: 1.3 10*3/uL — ABNORMAL HIGH (ref 0.1–1.0)
Monocytes Relative: 10 %
NRBC: 0 % (ref 0.0–0.2)
Neutro Abs: 8.7 10*3/uL — ABNORMAL HIGH (ref 1.7–7.7)
Neutrophils Relative %: 68 %
Platelets: 338 10*3/uL (ref 150–400)
RBC: 3.6 MIL/uL — ABNORMAL LOW (ref 3.87–5.11)
RDW: 13.7 % (ref 11.5–15.5)
WBC: 12.8 10*3/uL — AB (ref 4.0–10.5)

## 2018-01-16 LAB — COMPREHENSIVE METABOLIC PANEL
ALBUMIN: 3.5 g/dL (ref 3.5–5.0)
ALT: 41 U/L (ref 0–44)
AST: 34 U/L (ref 15–41)
Alkaline Phosphatase: 137 U/L — ABNORMAL HIGH (ref 38–126)
Anion gap: 10 (ref 5–15)
BILIRUBIN TOTAL: 0.8 mg/dL (ref 0.3–1.2)
BUN: 10 mg/dL (ref 8–23)
CO2: 27 mmol/L (ref 22–32)
Calcium: 9.1 mg/dL (ref 8.9–10.3)
Chloride: 102 mmol/L (ref 98–111)
Creatinine, Ser: 0.82 mg/dL (ref 0.44–1.00)
GFR calc Af Amer: >60 mL/min (ref >60)
GFR calc non Af Amer: >60 mL/min (ref >60)
GLUCOSE: 143 mg/dL — AB (ref 70–99)
POTASSIUM: 3.3 mmol/L — AB (ref 3.5–5.1)
Sodium: 139 mmol/L (ref 135–145)
TOTAL PROTEIN: 6.5 g/dL (ref 6.5–8.1)

## 2018-01-16 LAB — URINALYSIS, ROUTINE W REFLEX MICROSCOPIC
BILIRUBIN URINE: NEGATIVE
Glucose, UA: NEGATIVE mg/dL
KETONES UR: NEGATIVE mg/dL
Nitrite: NEGATIVE
PROTEIN: NEGATIVE mg/dL
SPECIFIC GRAVITY, URINE: 1.01 (ref 1.005–1.030)
pH: 6 (ref 5.0–8.0)

## 2018-01-16 MED ORDER — SODIUM CHLORIDE 0.9 % IV BOLUS (SEPSIS)
1000.0000 mL | Freq: Once | INTRAVENOUS | Status: AC
  Administered 2018-01-16: 1000 mL via INTRAVENOUS

## 2018-01-16 MED ORDER — FENTANYL CITRATE (PF) 100 MCG/2ML IJ SOLN
50.0000 ug | Freq: Once | INTRAMUSCULAR | Status: AC
  Administered 2018-01-16: 50 ug via INTRAVENOUS
  Filled 2018-01-16: qty 2

## 2018-01-16 MED ORDER — RIVAROXABAN 10 MG PO TABS: 10.0000 mg | ORAL_TABLET | Freq: Every day | ORAL | Status: DC

## 2018-01-16 MED ORDER — MONTELUKAST SODIUM 10 MG PO TABS
10.0000 mg | ORAL_TABLET | Freq: Every day | ORAL | Status: DC
  Administered 2018-01-16 – 2018-01-23 (×8): 10 mg via ORAL
  Filled 2018-01-16 (×8): qty 1

## 2018-01-16 MED ORDER — ONDANSETRON HCL 4 MG PO TABS: 4.0000 mg | ORAL_TABLET | Freq: Four times a day (QID) | ORAL | Status: DC | PRN

## 2018-01-16 MED ORDER — LEVOTHYROXINE SODIUM 50 MCG PO TABS
50.0000 ug | ORAL_TABLET | Freq: Every day | ORAL | Status: DC
  Administered 2018-01-17 – 2018-01-24 (×8): 50 ug via ORAL
  Filled 2018-01-16 (×8): qty 1

## 2018-01-16 MED ORDER — ACETAMINOPHEN 650 MG RE SUPP: 650.0000 mg | Freq: Four times a day (QID) | RECTAL | Status: DC | PRN

## 2018-01-16 MED ORDER — OXYCODONE HCL 5 MG PO TABS
5.0000 mg | ORAL_TABLET | Freq: Four times a day (QID) | ORAL | Status: DC | PRN
  Administered 2018-01-17 – 2018-01-22 (×10): 5 mg via ORAL
  Filled 2018-01-16 (×10): qty 1

## 2018-01-16 MED ORDER — ALBUTEROL SULFATE (2.5 MG/3ML) 0.083% IN NEBU: 2.5000 mg | INHALATION_SOLUTION | Freq: Every day | RESPIRATORY_TRACT | Status: DC | PRN

## 2018-01-16 MED ORDER — ENOXAPARIN SODIUM 40 MG/0.4ML ~~LOC~~ SOLN
40.0000 mg | SUBCUTANEOUS | Status: DC
  Administered 2018-01-16 – 2018-01-23 (×8): 40 mg via SUBCUTANEOUS
  Filled 2018-01-16 (×8): qty 0.4

## 2018-01-16 MED ORDER — SERTRALINE HCL 50 MG PO TABS: 100.0000 mg | ORAL_TABLET | Freq: Every day | ORAL | Status: DC

## 2018-01-16 MED ORDER — AMITRIPTYLINE HCL 10 MG PO TABS: 10.0000 mg | ORAL_TABLET | Freq: Every evening | ORAL | Status: DC | PRN

## 2018-01-16 MED ORDER — METHOCARBAMOL 500 MG PO TABS: 500.0000 mg | ORAL_TABLET | Freq: Four times a day (QID) | ORAL | Status: DC | PRN

## 2018-01-16 MED ORDER — DEXTROSE-NACL 5-0.9 % IV SOLN
INTRAVENOUS | Status: DC
  Administered 2018-01-16 – 2018-01-21 (×4): via INTRAVENOUS

## 2018-01-16 MED ORDER — ZIPRASIDONE HCL 20 MG PO CAPS
40.0000 mg | ORAL_CAPSULE | Freq: Two times a day (BID) | ORAL | Status: DC
  Administered 2018-01-16: 40 mg via ORAL
  Filled 2018-01-16: qty 2

## 2018-01-16 MED ORDER — FLUTICASONE PROPIONATE HFA 220 MCG/ACT IN AERO: 2.0000 | INHALATION_SPRAY | Freq: Two times a day (BID) | RESPIRATORY_TRACT | Status: DC

## 2018-01-16 MED ORDER — ACETAMINOPHEN 325 MG PO TABS
650.0000 mg | ORAL_TABLET | Freq: Four times a day (QID) | ORAL | Status: DC | PRN
  Administered 2018-01-19 – 2018-01-20 (×3): 650 mg via ORAL
  Filled 2018-01-16 (×3): qty 2

## 2018-01-16 MED ORDER — PROSIGHT PO TABS
1.0000 | ORAL_TABLET | Freq: Every day | ORAL | Status: DC
  Administered 2018-01-16 – 2018-01-24 (×9): 1 via ORAL
  Filled 2018-01-16 (×9): qty 1

## 2018-01-16 MED ORDER — ALBUTEROL SULFATE HFA 108 (90 BASE) MCG/ACT IN AERS
1.0000 | INHALATION_SPRAY | Freq: Every day | RESPIRATORY_TRACT | Status: DC | PRN
  Filled 2018-01-16: qty 6.7

## 2018-01-16 MED ORDER — HYDROXYZINE HCL 25 MG PO TABS
25.0000 mg | ORAL_TABLET | Freq: Every day | ORAL | Status: DC
  Administered 2018-01-16 – 2018-01-24 (×9): 25 mg via ORAL
  Filled 2018-01-16 (×9): qty 1

## 2018-01-16 MED ORDER — OXYCODONE HCL 5 MG PO TABS: 5.0000 mg | ORAL_TABLET | Freq: Four times a day (QID) | ORAL | Status: DC | PRN

## 2018-01-16 MED ORDER — GABAPENTIN 300 MG PO CAPS
300.0000 mg | ORAL_CAPSULE | Freq: Three times a day (TID) | ORAL | Status: DC
  Administered 2018-01-16: 300 mg via ORAL
  Filled 2018-01-16: qty 1

## 2018-01-16 MED ORDER — ROSUVASTATIN CALCIUM 10 MG PO TABS
20.0000 mg | ORAL_TABLET | Freq: Every morning | ORAL | Status: DC
  Administered 2018-01-16 – 2018-01-24 (×9): 20 mg via ORAL
  Filled 2018-01-16 (×9): qty 2

## 2018-01-16 MED ORDER — ONDANSETRON HCL 4 MG/2ML IJ SOLN
4.0000 mg | Freq: Once | INTRAMUSCULAR | Status: AC
  Administered 2018-01-16: 4 mg via INTRAVENOUS
  Filled 2018-01-16: qty 2

## 2018-01-16 MED ORDER — UMECLIDINIUM BROMIDE 62.5 MCG/INH IN AEPB
1.0000 | INHALATION_SPRAY | Freq: Every day | RESPIRATORY_TRACT | Status: DC
  Administered 2018-01-17: 1 via RESPIRATORY_TRACT
  Filled 2018-01-16: qty 7

## 2018-01-16 MED ORDER — VITAMIN B-1 100 MG PO TABS
100.0000 mg | ORAL_TABLET | Freq: Every day | ORAL | Status: DC
  Administered 2018-01-16 – 2018-01-24 (×9): 100 mg via ORAL
  Filled 2018-01-16 (×9): qty 1

## 2018-01-16 MED ORDER — PRIMIDONE 50 MG PO TABS
50.0000 mg | ORAL_TABLET | Freq: Two times a day (BID) | ORAL | Status: DC
  Administered 2018-01-16 – 2018-01-24 (×16): 50 mg via ORAL
  Filled 2018-01-16 (×17): qty 1

## 2018-01-16 MED ORDER — BUDESONIDE 0.5 MG/2ML IN SUSP
1.0000 mg | Freq: Two times a day (BID) | RESPIRATORY_TRACT | Status: DC
  Administered 2018-01-16 – 2018-01-24 (×16): 1 mg via RESPIRATORY_TRACT
  Filled 2018-01-16 (×16): qty 4

## 2018-01-16 MED ORDER — ONDANSETRON HCL 4 MG/2ML IJ SOLN
4.0000 mg | Freq: Four times a day (QID) | INTRAMUSCULAR | Status: DC | PRN
  Administered 2018-01-18 – 2018-01-19 (×2): 4 mg via INTRAVENOUS
  Filled 2018-01-16 (×2): qty 2

## 2018-01-16 NOTE — ED Notes (Signed)
Pt states she is unable to void at this time.

## 2018-01-16 NOTE — Evaluation (Signed)
Physical Therapy Evaluation Patient Details Name: [name] MRN: [id number] DOB: 05-Apr-1955 Today's Date: 01/16/2018   History of Present Illness  62 yo female admitted to ED on 12/9 for increasing total body weakness, inability to ambulate significantly over the past week. Pt with L TKR on 12/26/17. Per hospitalists' note, pt with possible serotonin syndrome, parkinsonism. Pt with PMH of bipolar disorder, pt currently off serotonin medications. Other PMH includes anxiety, claustrophobia, depression, UTI, TMJ dysfunction, migraines, OA, OSA on CPAP, PVCs, osteoporosis, obesity, hiatial hernia repair 2018, tremors, IBS.   Clinical Impression   Pt presents with total body weakness (bilateral LEs, bilateral UEs, trunk), extreme difficulty participating in bed mobility, unable to stand today due to LE weakness and pt refusal, flat affect, and decreased tolerance for mobility. Pt with slight UE tremors noted, and mask-like expression. Pt to benefit from acute PT to address deficits. Pt required max assist for all mobility, and had difficulty participating in supine heel slides or sitting seated marches, pt noting this LE weakness has been present progressively, but has been significant in the past week or so. PT recommending SNF level of care after d/c from hospital given pt's clinical presentation. Pt also with lack of d/c plan. PT to progress mobility as tolerated, and will continue to follow acutely.      Follow Up Recommendations SNF;Supervision for mobility/OOB    Equipment Recommendations  None recommended by PT    Recommendations for Other Services       Precautions / Restrictions Precautions Precautions: Fall Restrictions Weight Bearing Restrictions: No Other Position/Activity Restrictions: WBAT      Mobility  Bed Mobility Overal bed mobility: Needs Assistance Bed Mobility: Supine to Sit;Sit to Supine     Supine to sit: Max assist;HOB elevated Sit to supine: Max  assist;HOB elevated   General bed mobility comments: max assist for trunk elevation, bilateral LE lifting and translation to and from EOB. Pt sat EOB without PT support for 3 minutes.   Transfers Overall transfer level: (NT - pt deferring sit to stand today, also pt with LE weakness not conducive to standing (per screen and pt's lack of ability to perform hip and knee flexion/extension)                  Ambulation/Gait                Stairs            Wheelchair Mobility    Modified Rankin (Stroke Patients Only)       Balance Overall balance assessment: Needs assistance Sitting-balance support: Bilateral upper extremity supported Sitting balance-Leahy Scale: Fair   Postural control: Posterior lean(with LE movement) Standing balance support: (NT )                                 Pertinent Vitals/Pain Pain Assessment: Faces Faces Pain Scale: Hurts little more Pain Location: bilateral LEs, during PROM of LEs  Pain Descriptors / Indicators: Grimacing;Guarding Pain Intervention(s): Limited activity within patient's tolerance;Repositioned;Monitored during session    Home Living Family/patient expects to be discharged to:: Skilled nursing facility(Pt reports she cannot d/c to her friend's home, which she did after her L TKR in November. Pt wants to d/c to rehab)               Home Equipment: Kasandra Knudsen - single point;Walker - 2 wheels;Bedside commode      Prior Function Level  of Independence: Needs assistance   Gait / Transfers Assistance Needed: Pt reports increasing inability to ambulate at home. Pt reports over the last week having to call for friend to bring a chair to her on the way to the bathroom because she could not stand anymore. Pt reports 3 near falls since TKR.   ADL's / Homemaking Assistance Needed: Pt reports not being able to hold utensils to eat over the past week, difficulty getting in and out of bed.         Hand  Dominance   Dominant Hand: Left    Extremity/Trunk Assessment   Upper Extremity Assessment Upper Extremity Assessment: Defer to OT evaluation(weak grip strength bilaterally, increased time to perform active elbow flexion and extension)    Lower Extremity Assessment Lower Extremity Assessment: LLE deficits/detail;RLE deficits/detail RLE Deficits / Details: 2/5 hip flexion bilaterally, unable to perform active PF/DF. Knee extension against gravity NT, but only able to minimally contract hamstrings when attempting to perform heel slides. bilateral feet held in a plantarflexed, inverted position with crossing at the feet.  LLE Deficits / Details: 2/5 hip flexion bilaterally, unable to perform active PF/DF. Knee extension against gravity NT, but only able to minimally contract hamstrings when attempting to perform heel slides. bilateral feet held in a plantarflexed, inverted position with crossing at the feet.     Cervical / Trunk Assessment Cervical / Trunk Assessment: Kyphotic  Communication   Communication: No difficulties  Cognition Arousal/Alertness: Awake/alert Behavior During Therapy: Flat affect Overall Cognitive Status: Within Functional Limits for tasks assessed                                 General Comments: Pt with mask-like expression during session, very flat affect with no indication of emotional inflection in voice.       General Comments General comments (skin integrity, edema, etc.): Pt with very slight UE tremors noted. Pt stating she has been urinary incotinent while in ED, and did not walk to attempt standing/ambulation at this time because she was fearful to remove purewick. Pt states that prior to her TKR and since, she occasionally has double vision. PT assessed pt's Oculomotor function; smooth pursuits and sustained holding at end range seemingly WNL, some saccades noted during smooth pursuits but unsure if this was true saccades or pt with difficulty  following instructions for testing.     Exercises     Assessment/Plan    PT Assessment Patient needs continued PT services  PT Problem List Decreased strength;Decreased range of motion;Decreased balance;Decreased mobility;Pain;Decreased knowledge of use of DME;Decreased activity tolerance       PT Treatment Interventions DME instruction;Therapeutic activities;Gait training;Therapeutic exercise;Patient/family education;Balance training;Functional mobility training    PT Goals (Current goals can be found in the Care Plan section)  Acute Rehab PT Goals Patient Stated Goal: get stronger  PT Goal Formulation: With patient Time For Goal Achievement: 01/30/18 Potential to Achieve Goals: Good    Frequency Min 2X/week   Barriers to discharge        Co-evaluation               AM-PAC PT "6 Clicks" Mobility  Outcome Measure Help needed turning from your back to your side while in a flat bed without using bedrails?: A Lot Help needed moving from lying on your back to sitting on the side of a flat bed without using bedrails?: Total Help needed moving to and  from a bed to a chair (including a wheelchair)?: Total Help needed standing up from a chair using your arms (e.g., wheelchair or bedside chair)?: Total Help needed to walk in hospital room?: Total Help needed climbing 3-5 steps with a railing? : Total 6 Click Score: 7    End of Session   Activity Tolerance: Patient limited by fatigue;Other (comment)(pt limited by weakness) Patient left: in bed;with call bell/phone within reach;with nursing/sitter in room(NT just outside pt's room, pt to move to the floor after PT eval ) Nurse Communication: Mobility status PT Visit Diagnosis: Other abnormalities of gait and mobility (R26.89);Muscle weakness (generalized) (M62.81)    Time: 5790-3833 PT Time Calculation (min) (ACUTE ONLY): 22 min   Charges:   PT Evaluation $PT Eval Low Complexity: 1 Low         Guadalupe Kerekes Conception Chancy,  PT Acute Rehabilitation Services Pager [phone]  Office [phone]  Rory Montel D Rasheda Ledger 01/16/2018, 4:14 PM

## 2018-01-16 NOTE — ED Notes (Signed)
ED TO INPATIENT HANDOFF REPORT  Name/Age/Gender [name] 62 y.o. female  Code Status    Code Status Orders  (From admission, onward)         Start     Ordered   01/16/18 0921  Full code  Continuous     01/16/18 0920        Code Status History    Date Active Date Inactive Code Status Order ID Comments User Context   12/26/2017 1206 12/29/2017 1834 Full Code [id number]  Gaynelle Arabian, MD Inpatient   03/04/2016 1258 03/05/2016 2052 Full Code [id number]  Michael Boston, MD Inpatient   12/12/2012 1306 12/12/2012 2128 Full Code [id number]  Delaine Lame ED   02/08/2012 0833 02/10/2012 1643 Full Code [id number]  Allen, [place] M, RN Inpatient   11/26/2011 1257 11/27/2011 1325 Full Code [id number]  Janice Norrie, MD ED      Home/SNF/Other Home  Chief Complaint Knee pain   Level of Care/Admitting Diagnosis ED Disposition    ED Disposition Condition [facility] Area: [facility] [[id number]]  Level of Care: Med-Surg [16]  Diagnosis: Serotonin syndrome [867619]  Admitting Physician: Tawni Millers [[id number]]  Attending Physician: Tawni Millers [[id number]]  Estimated length of stay: 3 - 4 days  Certification:: I certify this patient will need inpatient services for at least 2 midnights  PT Class (Do Not Modify): Inpatient [101]  PT Acc Code (Do Not Modify): Private [1]       Medical History Past Medical History:  Diagnosis Date  . Allergic rhinitis   . Anxiety   . Benign essential tremor    hands  . Bipolar 1 disorder, mixed, moderate ([place])   . Claustrophobia   . Depression   . Eczema   . Frequency of urination   . History of frequent urinary tract infections   . History of gastroesophageal reflux (GERD)    05-25-2017  per pt no issues since hiatal hernia repair 01/ 2018  . History of hiatal hernia   . History of melanoma excision    early 2000s-- BACK  . History of panic attacks   . History of squamous cell  carcinoma excision 2004   left ear  . Hypothyroidism   . Intermittent palpitations    cardiology--  dr [place]  . Interstitial cystitis   . Limited jaw range of motion    s/p  bilateral TMJ surgery,  age 57s  . Migraines   . Moderate persistent asthma    pulmologist-- dr Halford Chessman  . OA (osteoarthritis)    both knees  . OSA on CPAP    per last sleep study 09/ 2017  mild OSA, AHI13/hr  . PONV (postoperative nausea and vomiting)   . PVC (premature ventricular contraction)   . Rosacea   . Sensation of pressure in bladder area    per pt intermittant  . TMJ arthralgia   . Urticaria   . Wears glasses   . White coat syndrome without hypertension    05-25-2017  per pt hx hypertension yrs ago, no issues after quitting stressful job    Allergies Allergies  Allergen Reactions  . Indomethacin Other (See Comments)    Muscle spasms Causes muscle spasms in neck  . Amlodipine Swelling    Peripheral edema  . Pseudoephedrine Other (See Comments)    I fly off the walls  CANNOT SLEEP  . Risperidone And Related Other (See Comments)    Stomach upset, insomnia,  drooling, tremors, "jerks," sensitivity to touch.   . Bupivacaine Hives  . Pentazocine Other (See Comments)    Unknown reaction MADE ME "LACTATE"  . Propoxyphene Itching and Nausea And Vomiting    darvocet  . Sulfa Antibiotics Other (See Comments)    Unknown reaction  . Tramadol Other (See Comments)    Patient can not remember  . Aspirin Other (See Comments)    upset stomach, ringing in the ears  . Codeine Itching and Other (See Comments)    Anything related to codeine  . Etodolac Rash and Other (See Comments)  . Ivp Dye [Iodinated Diagnostic Agents] Itching    Flushed and Fever, itch all over  . Propranolol Nausea Only and Other (See Comments)    IV Location/Drains/Wounds Patient Lines/Drains/Airways Status   Active Line/Drains/Airways    Name:   Placement date:   Placement time:   Site:   Days:   Peripheral IV 01/16/18  Right Antecubital   01/16/18    -    Antecubital   less than 1   Incision (Closed) 12/26/17 Knee Left   12/26/17    0904     21          Labs/Imaging Results for orders placed or performed during the hospital encounter of 01/16/18 (from the past 48 hour(s))  CBC with Differential/Platelet     Status: Abnormal   Collection Time: 01/16/18  4:46 AM  Result Value Ref Range   WBC 12.8 (H) 4.0 - 10.5 K/uL   RBC 3.60 (L) 3.87 - 5.11 MIL/uL   Hemoglobin 10.8 (L) 12.0 - 15.0 g/dL   HCT 34.1 (L) 36.0 - 46.0 %   MCV 94.7 80.0 - 100.0 fL   MCH 30.0 26.0 - 34.0 pg   MCHC 31.7 30.0 - 36.0 g/dL   RDW 13.7 11.5 - 15.5 %   Platelets 338 150 - 400 K/uL   nRBC 0.0 0.0 - 0.2 %   Neutrophils Relative % 68 %   Neutro Abs 8.7 (H) 1.7 - 7.7 K/uL   Lymphocytes Relative 20 %   Lymphs Abs 2.6 0.7 - 4.0 K/uL   Monocytes Relative 10 %   Monocytes Absolute 1.3 (H) 0.1 - 1.0 K/uL   Eosinophils Relative 1 %   Eosinophils Absolute 0.1 0.0 - 0.5 K/uL   Basophils Relative 0 %   Basophils Absolute 0.0 0.0 - 0.1 K/uL   Immature Granulocytes 1 %   Abs Immature Granulocytes 0.07 0.00 - 0.07 K/uL    Comment: Performed at [facility], [place] [address] [place], [place] [address]  Comprehensive metabolic panel     Status: Abnormal   Collection Time: 01/16/18  4:46 AM  Result Value Ref Range   Sodium 139 135 - 145 mmol/L   Potassium 3.3 (L) 3.5 - 5.1 mmol/L   Chloride 102 98 - 111 mmol/L   CO2 27 22 - 32 mmol/L   Glucose, Bld 143 (H) 70 - 99 mg/dL   BUN 10 8 - 23 mg/dL   Creatinine, Ser 0.82 0.44 - 1.00 mg/dL   Calcium 9.1 8.9 - 10.3 mg/dL   Total Protein 6.5 6.5 - 8.1 g/dL   Albumin 3.5 3.5 - 5.0 g/dL   AST 34 15 - 41 U/L   ALT 41 0 - 44 U/L   Alkaline Phosphatase 137 (H) 38 - 126 U/L   Total Bilirubin 0.8 0.3 - 1.2 mg/dL   GFR calc non Af Amer >60 >60 mL/min   GFR  calc Af Amer >60 >60 mL/min   Anion gap 10 5 - 15    Comment: Performed at [facility], [place]  [address], [place], [place] [address]  Urinalysis, Routine w reflex microscopic     Status: Abnormal   Collection Time: 01/16/18  8:04 AM  Result Value Ref Range   Color, Urine YELLOW YELLOW   APPearance CLOUDY (A) CLEAR   Specific Gravity, Urine 1.010 1.005 - 1.030   pH 6.0 5.0 - 8.0   Glucose, UA NEGATIVE NEGATIVE mg/dL   Hgb urine dipstick SMALL (A) NEGATIVE   Bilirubin Urine NEGATIVE NEGATIVE   Ketones, ur NEGATIVE NEGATIVE mg/dL   Protein, ur NEGATIVE NEGATIVE mg/dL   Nitrite NEGATIVE NEGATIVE   Leukocytes, UA LARGE (A) NEGATIVE   RBC / HPF 0-5 0 - 5 RBC/hpf   WBC, UA 6-10 0 - 5 WBC/hpf   Bacteria, UA MANY (A) NONE SEEN   Squamous Epithelial / LPF 11-20 0 - 5   Mucus PRESENT    Hyaline Casts, UA PRESENT     Comment: Performed at [facility], [place] [address], [place], [place] [address]   Dg Chest Portable 1 View  Result Date: 01/16/2018 CLINICAL DATA:  Initial evaluation for acute cough, weakness. EXAM: PORTABLE CHEST 1 VIEW COMPARISON:  None available. FINDINGS: Cardiac and mediastinal silhouettes within normal limits. Lungs normally inflated. Mild left basilar subsegmental atelectasis. No focal infiltrates. No edema or effusion. No pneumothorax. Osseous structures within normal limits. IMPRESSION: Mild left basilar subsegmental atelectasis. No other active cardiopulmonary disease. Electronically Signed   By: Jeannine Boga M.D.   On: 01/16/2018 04:13   EKG Interpretation  Date/Time:  Monday January 16 2018 03:46:48 EST Ventricular Rate:  109 PR Interval:    QRS Duration: 66 QT Interval:  328 QTC Calculation: 442 R Axis:   31 Text Interpretation:  Sinus tachycardia Probable left atrial enlargement Confirmed by Ripley Fraise [id number]) on 01/16/2018 4:03:52 AM Also confirmed by Ripley Fraise [phone]), editor Hattie Perch ([id number])  on 01/16/2018 7:23:49 AM   Pending Labs Unresulted Labs (From admission, onward)    Start     Ordered   01/23/18  0500  Creatinine, serum  (enoxaparin (LOVENOX)    CrCl >/= 30 ml/min)  Weekly,   R    Comments:  while on enoxaparin therapy    01/16/18 0920   01/17/18 2426  Basic metabolic panel  Tomorrow morning,   R     01/16/18 0920   01/17/18 0500  CBC  Tomorrow morning,   R     01/16/18 0920   01/16/18 0921  HIV antibody (Routine Testing)  Once,   R     01/16/18 0920   01/16/18 0921  CBC  (enoxaparin (LOVENOX)    CrCl >/= 30 ml/min)  Once,   R    Comments:  Baseline for enoxaparin therapy IF NOT ALREADY DRAWN.  Notify MD if PLT < 100 K.    01/16/18 0920   01/16/18 0921  Creatinine, serum  (enoxaparin (LOVENOX)    CrCl >/= 30 ml/min)  Once,   R    Comments:  Baseline for enoxaparin therapy IF NOT ALREADY DRAWN.    01/16/18 0920          Vitals/Pain Today's Vitals   01/16/18 0800 01/16/18 0803 01/16/18 0834 01/16/18 0900  BP: 138/82 138/82  (!) 149/82  Pulse: (!) 108 (!) 108 (!) 109 (!) 108  Resp: (!) 21 17 17 16   Temp:  TempSrc:      SpO2: 95% 98% 96% 95%  Weight:      Height:      PainSc:        Isolation Precautions No active isolations  Medications Medications  rosuvastatin (CRESTOR) tablet 20 mg (has no administration in time range)  hydrOXYzine (ATARAX/VISTARIL) tablet 25 mg (25 mg Oral Given 01/16/18 1010)  levothyroxine (SYNTHROID, LEVOTHROID) tablet 50 mcg (has no administration in time range)  methocarbamol (ROBAXIN) tablet 500 mg (has no administration in time range)  primidone (MYSOLINE) tablet 50 mg (has no administration in time range)  albuterol (PROVENTIL HFA;VENTOLIN HFA) 108 (90 Base) MCG/ACT inhaler 1-2 puff (has no administration in time range)  fluticasone (FLOVENT HFA) 220 MCG/ACT inhaler 2 puff (has no administration in time range)  montelukast (SINGULAIR) tablet 10 mg (has no administration in time range)  umeclidinium bromide (INCRUSE ELLIPTA) 62.5 MCG/INH 1 puff (has no administration in time range)  enoxaparin (LOVENOX) injection 40 mg (has no  administration in time range)  acetaminophen (TYLENOL) tablet 650 mg (has no administration in time range)    Or  acetaminophen (TYLENOL) suppository 650 mg (has no administration in time range)  ondansetron (ZOFRAN) tablet 4 mg (has no administration in time range)    Or  ondansetron (ZOFRAN) injection 4 mg (has no administration in time range)  oxyCODONE (Oxy IR/ROXICODONE) immediate release tablet 5 mg (has no administration in time range)  dextrose 5 %-0.9 % sodium chloride infusion (has no administration in time range)  thiamine (VITAMIN B-1) tablet 100 mg (has no administration in time range)  multivitamin (PROSIGHT) tablet 1 tablet (has no administration in time range)  sodium chloride 0.9 % bolus 1,000 mL (0 mLs Intravenous Stopped 01/16/18 0514)  fentaNYL (SUBLIMAZE) injection 50 mcg (50 mcg Intravenous Given 01/16/18 0551)  sodium chloride 0.9 % bolus 1,000 mL (0 mLs Intravenous Stopped 01/16/18 0813)  ondansetron (ZOFRAN) injection 4 mg (4 mg Intravenous Given 01/16/18 0615)    Mobility walks with person assist

## 2018-01-16 NOTE — ED Notes (Signed)
Pt denies being able to provide a urine specimen at this time

## 2018-01-16 NOTE — ED Notes (Signed)
Bed: WA21 Expected date:  Expected time:  Means of arrival:  Comments: EMS weakness s/p knee replacement

## 2018-01-16 NOTE — H&P (Signed)
History and Physical    [name] NWG:[id number] DOB: 08/10/1955 DOA: 01/16/2018  PCP: Martinique, Betty G, MD   Patient coming from: home   Chief Complaint: Ambulatory dysfunction.   HPI: [name] is a 62 y.o. female with medical history significant of anxiety, bipolar, depression, and GERD, who presents with worsening ambulatory dysfunction.  Patient has chronic ambulatory dysfunction, shuffling gate, uses a walker for mobility, but for the last 7 days her symptoms have been more persistent and progressive to the point where she has been bedridden.  She attributes her decreased mobility to stiffness and rigidity that has been generalized, no improving factors, no worsening factors, associated with difficulty swallowing, and severe tremors but no confusion, no fevers.  Her appetite has decreased.  No recent new medications.  Patient had a left total knee arthroplasty on November 18, with no major complications, participating with physical therapy at home.  ED Course: Patient was found very weak and deconditioned, suspected urinary tract infection, he was referred for admission for evaluation.  Review of Systems:  1. General: No fevers, no chills, no weight gain or weight loss 2. ENT: No runny nose or sore throat, no hearing disturbances 3. Pulmonary: No dyspnea, cough, wheezing, or hemoptysis 4. Cardiovascular: No angina, claudication, lower extremity edema, pnd or orthopnea 5. Gastrointestinal: No nausea or vomiting, no diarrhea or constipation 6. Hematology: No easy bruisability or frequent infections 7. Urology: No dysuria, hematuria or increased urinary frequency 8. Dermatology: No rashes. 9. Neurology: No seizures or paresthesias 10. Musculoskeletal: generalized stiffness as mentioned in the HPI. Right knee with post operative changes.   Past Medical History:  Diagnosis Date  . Allergic rhinitis   . Anxiety   . Benign essential tremor    hands  . Bipolar 1 disorder,  mixed, moderate ([place])   . Claustrophobia   . Depression   . Eczema   . Frequency of urination   . History of frequent urinary tract infections   . History of gastroesophageal reflux (GERD)    05-25-2017  per pt no issues since hiatal hernia repair 01/ 2018  . History of hiatal hernia   . History of melanoma excision    early 2000s-- BACK  . History of panic attacks   . 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 Hypothyroidism   . Intermittent palpitations    cardiology--  dr Martinique  . Interstitial cystitis   . Limited jaw range of motion    s/p  bilateral TMJ surgery,  age 53s  . Migraines   . Moderate persistent asthma    pulmologist-- dr Halford Chessman  . OA (osteoarthritis)    both knees  . OSA on CPAP    per last sleep study 09/ 2017  mild OSA, AHI13/hr  . PONV (postoperative nausea and vomiting)   . PVC (premature ventricular contraction)   . Rosacea   . Sensation of pressure in bladder area    per pt intermittant  . TMJ arthralgia   . Urticaria   . Wears glasses   . White coat syndrome without hypertension    05-25-2017  per pt hx hypertension yrs ago, no issues after quitting stressful job    Past Surgical History:  Procedure Laterality Date  . [place] STUDY N/A 09/22/2015   Procedure: [place] STUDY;  Surgeon: Doran Stabler, MD;  Location: [facility];  Service: Gastroenterology;  Laterality: N/A;  . ADENOIDECTOMY    .  ANAL FISSURE REPAIR  age 69  (33)  . BREAST EXCISIONAL BIOPSY Right 04-16-2003  dr p. young  [place]   benign  . BUNIONECTOMY Right early 2000s  . CARPAL TUNNEL RELEASE Left age 9 (64)  . CHOLECYSTECTOMY OPEN  age 69  . CYSTO WITH HYDRODISTENSION N/A 06/02/2017   Procedure: CYSTOSCOPY/HYDRODISTENSION OF BLADDER;  Surgeon: Irine Seal, MD;  Location: [facility];  Service: Urology;  Laterality: N/A;  . CYSTO/ HYDRODISTENTION/  INSTILLATION THERAPY  1990s  . DX LAPAROSCOPY/  DX HYSTEROSCOPY/  D & C   08-07-2007   dr dove  [facility]  . ENDOMETRIAL ABLATION W/ NOVASURE  10-21-2008   dr Harolyn Rutherford  [facility]  . ESOPHAGEAL MANOMETRY N/A 09/22/2015   Procedure: ESOPHAGEAL MANOMETRY (EM);  Surgeon: Doran Stabler, MD;  Location: [facility];  Service: Gastroenterology;  Laterality: N/A;  with impedence   . FINGER ARTHROPLASTY Bilateral    left little finger 2016:  right middle finger 06/ 2018  . KNEE ARTHROSCOPY Bilateral x3 left ;  x3  right-- last one age 60 (80)  . NASAL SEPTUM SURGERY  age 35s  . ROBOT ASSISTED REDUCTION PARAESOPHAGEAL HIATAL HERNIA/ TYPE 2 MEDIASTINAL DISSECTION/ PRIMARY HIATAL HERNIA REPAIR/ ANTERIOR & POSTERIOR GASTROPEXY/ NISSEN FUNDOPLICATION  [id number]   DR GROSS  [facility]  . SINOSCOPY    . TEMPOROMANDIBULAR JOINT SURGERY Bilateral x3  -- age 59s   per pt used graft  . TONSILLECTOMY    . TOTAL KNEE ARTHROPLASTY Left 12/26/2017   Procedure: LEFT TOTAL KNEE ARTHROPLASTY;  Surgeon: Gaynelle Arabian, MD;  Location: [facility];  Service: Orthopedics;  Laterality: Left;  63min  . TRANSTHORACIC ECHOCARDIOGRAM  12-06-2017   dr Martinique   ef 55-60%, grade 1 diastoilc dysfunction, trivial MR  . TRIGGER FINGER RELEASE Bilateral last one 2017   several release's bilaterally  . ULNAR NERVE TRANSPOSITION Bilateral age 7 (1980)     reports that she has never smoked. She has never used smokeless tobacco. She reports that she drinks alcohol. She reports that she does not use drugs.  Allergies  Allergen Reactions  . Indomethacin Other (See Comments)    Muscle spasms Causes muscle spasms in neck  . Amlodipine Swelling    Peripheral edema  . Pseudoephedrine Other (See Comments)    I fly off the walls  CANNOT SLEEP  . Risperidone And Related Other (See Comments)    Stomach upset, insomnia, drooling, tremors, "jerks," sensitivity to touch.   . Bupivacaine Hives  . Pentazocine Other (See Comments)    Unknown reaction MADE ME "LACTATE"  . Propoxyphene Itching and Nausea And Vomiting    darvocet    . Sulfa Antibiotics Other (See Comments)    Unknown reaction  . Tramadol Other (See Comments)    Patient can not remember  . Aspirin Other (See Comments)    upset stomach, ringing in the ears  . Codeine Itching and Other (See Comments)    Anything related to codeine  . Etodolac Rash and Other (See Comments)  . Ivp Dye [Iodinated Diagnostic Agents] Itching    Flushed and Fever, itch all over  . Propranolol Nausea Only and Other (See Comments)    Family History  Problem Relation Age of Onset  . Hypertension Mother        deceased from MVA complications  . Thyroid disease Mother   . Allergic rhinitis Mother   . Testicular cancer Father   . Allergic rhinitis Father   . Colon polyps Sister   .  Coronary artery disease Unknown      Prior to Admission medications   Medication Sig Start Date End Date Taking? Authorizing Provider  albuterol (PROVENTIL HFA;VENTOLIN HFA) 108 (90 Base) MCG/ACT inhaler Inhale 1-2 puffs into the lungs daily as needed for wheezing or shortness of breath. 08/02/17  Yes Bobbitt, Sedalia Muta, MD  albuterol (PROVENTIL) (2.5 MG/3ML) 0.083% nebulizer solution TAKE 3 MLS BY NEBULIZATION EVERY 6 HOURS AS NEEDED FOR WHEEZING OR SHORTNESS OF BREATH. Patient taking differently: Take 2.5 mg by nebulization every 6 (six) hours as needed for wheezing or shortness of breath.  10/24/17  Yes Bobbitt, Sedalia Muta, MD  alendronate (FOSAMAX) 70 MG tablet TAKE 1 TABLET BY MOUTH EVERY 7 DAYS WITH FULL GLASS OF WATER ON EMPTY STOMACH Patient taking differently: Take 70 mg by mouth once a week.  05/20/17  Yes Martinique, Betty G, MD  amitriptyline (ELAVIL) 10 MG tablet Take 10 mg by mouth at bedtime as needed for sleep.  06/16/17  Yes [provider]  anti-nausea (EMETROL) solution Take 30 mLs by mouth every 15 (fifteen) minutes as needed for nausea or vomiting.   Yes [provider]  Azelastine HCl 0.15 % SOLN PLACE 2 SPRAYS INTO THE NOSE DAILY AS NEEDED. Patient taking  differently: Place 2 sprays into the nose daily as needed (congestion).  12/20/17  Yes Bobbitt, Sedalia Muta, MD  Carbinoxamine Maleate 4 MG TABS TAKE 1 TABLET BY MOUTH EVERY 8 HOURS AS NEEDED Patient taking differently: Take 4 mg by mouth every 8 (eight) hours as needed (allergies).  12/12/17  Yes Bobbitt, Sedalia Muta, MD  ELMIRON 100 MG capsule Take 100 mg by mouth 2 (two) times daily.  06/21/17  Yes [provider]  estradiol (ESTRACE) 0.1 MG/GM vaginal cream Place 1 Applicatorful vaginally once a week. 10/25/17  Yes Martinique, Betty G, MD  fluticasone (FLOVENT HFA) 220 MCG/ACT inhaler TAKE 2 PUFFS BY MOUTH TWICE A DAY Patient taking differently: Inhale 2 puffs into the lungs 2 (two) times daily.  09/26/17  Yes Bobbitt, Sedalia Muta, MD  gabapentin (NEURONTIN) 300 MG capsule Take 300 mg by mouth 3 (three) times daily.    Yes [provider]  hydrocortisone cream 1 % Apply 1 application topically 2 (two) times daily as needed for itching.    Yes [provider]  hydrOXYzine (ATARAX/VISTARIL) 25 MG tablet Take 25 mg by mouth daily after breakfast.  10/06/17  Yes [provider]  levothyroxine (SYNTHROID, LEVOTHROID) 50 MCG tablet TAKE 1 TABLET EVERY DAY--- takes in am Patient taking differently: Take 50 mcg by mouth daily before breakfast.  07/25/17  Yes Martinique, Betty G, MD  loperamide (IMODIUM A-D) 2 MG tablet Take 2-4 mg by mouth 4 (four) times daily as needed for diarrhea or loose stools.    Yes [provider]  methocarbamol (ROBAXIN) 500 MG tablet Take 1 tablet (500 mg total) by mouth every 6 (six) hours as needed for muscle spasms. 12/27/17  Yes Edmisten, Kristie L, PA  mometasone (NASONEX) 50 MCG/ACT nasal spray Place 2 sprays into the nose every morning.    Yes [provider]  montelukast (SINGULAIR) 10 MG tablet Take 1 tablet (10 mg total) by mouth at bedtime. Patient taking differently: Take 10 mg by mouth at bedtime.  10/21/16  Yes Parrett,  Tammy S, NP  olopatadine (PATANOL) 0.1 % ophthalmic solution Place 1 drop into both eyes 2 (two) times daily as needed for allergies.   Yes [provider]  oxyCODONE (OXY  IR/ROXICODONE) 5 MG immediate release tablet Take 1-2 tablets (5-10 mg total) by mouth every 6 (six) hours as needed for moderate pain (pain score 4-6). 12/27/17  Yes Edmisten, Kristie L, PA  primidone (MYSOLINE) 50 MG tablet Take 1 tablet (50 mg total) by mouth 2 (two) times daily. 01/12/18  Yes Tat, Eustace Quail, DO  rivaroxaban (XARELTO) 10 MG TABS tablet Take 1 tablet (10 mg total) by mouth daily with breakfast for 19 days. Take one 10 mg xarelto once a day for three weeks following surgery to prevent blood clots. 12/28/17 01/16/18 Yes Edmisten, Kristie L, PA  rizatriptan (MAXALT-MLT) 10 MG disintegrating tablet TAKE 1 TABLET EVERY DAY AS NEEDED FOR MIGRAINE, MAY REPEAT IN 2 HRS IF NEEDED. MAX 2 DOSES/WK Patient taking differently: Take 10 mg by mouth every 2 (two) hours as needed for migraine.  09/20/17  Yes Martinique, Betty G, MD  rosuvastatin (CRESTOR) 20 MG tablet TAKE 1 TABLET BY MOUTH EVERY DAY Patient taking differently: Take 20 mg by mouth every morning.  07/25/17  Yes Martinique, Betty G, MD  sertraline (ZOLOFT) 100 MG tablet Take 100 mg by mouth at bedtime.    Yes [provider]  umeclidinium bromide (INCRUSE ELLIPTA) 62.5 MCG/INH AEPB Inhale 1 puff into the lungs daily. 10/05/17  Yes Bobbitt, Sedalia Muta, MD  ziprasidone (GEODON) 40 MG capsule Take 40 mg by mouth 2 (two) times daily. Morning & with supper 10/10/17  Yes [provider]  ketoconazole (NIZORAL) 2 % shampoo Apply 1 application topically 2 (two) times a week. 12/05/17   [provider]  Tiotropium Bromide Monohydrate (SPIRIVA RESPIMAT) 1.25 MCG/ACT AERS Inhale 2 puffs into the lungs daily. Patient not taking: Reported on 01/16/2018 12/19/17   Bobbitt, Sedalia Muta, MD  Zoster Vaccine Adjuvanted [facility]) injection 0.5 ml in muscle and  repeat in 8 weeks Patient not taking: Reported on 01/16/2018 12/19/17   Martinique, Betty G, MD    Physical Exam: Vitals:   01/16/18 0400 01/16/18 0500 01/16/18 0600 01/16/18 0803  BP: (!) 150/81 (!) 161/82 (!) 155/86 138/82  Pulse: (!) 113 (!) 107 (!) 110 (!) 108  Resp: 15 20 19 17   Temp:      TempSrc:      SpO2: 97% 96% 95% 98%  Weight:      Height:        Vitals:   01/16/18 0400 01/16/18 0500 01/16/18 0600 01/16/18 0803  BP: (!) 150/81 (!) 161/82 (!) 155/86 138/82  Pulse: (!) 113 (!) 107 (!) 110 (!) 108  Resp: 15 20 19 17   Temp:      TempSrc:      SpO2: 97% 96% 95% 98%  Weight:      Height:       General: deconditioned and ill looking appearing  Neurology: Awake and alert, positive cogwheel rigidity upper extremities, positive tremors, decreased proximal strength lower extremities 2/5 bilaterally. Mask facies   Head and Neck. Head normocephalic. Neck supple with no adenopathy or thyromegaly.   E ENT:  mild pallor, no icterus, oral mucosa moist Cardiovascular: No JVD. S1-S2 present, rhythmic, no gallops, rubs, or murmurs. ++ non pitting lower extremity edema. Pulmonary: positive breath sounds bilaterally, adequate air movement, no wheezing, rhonchi or rales. Gastrointestinal. Abdomen with, no organomegaly, non tender, no rebound or guarding Skin. No rashes Musculoskeletal: no joint deformities    Labs on Admission: I have personally reviewed following labs and imaging studies  CBC: Recent Labs  Lab 01/16/18 0446  WBC 12.8*  NEUTROABS 8.7*  HGB 10.8*  HCT 34.1*  MCV 94.7  PLT 035   Basic Metabolic Panel: Recent Labs  Lab 01/16/18 0446  NA 139  K 3.3*  CL 102  CO2 27  GLUCOSE 143*  BUN 10  CREATININE 0.82  CALCIUM 9.1   GFR: Estimated Creatinine Clearance: 62.2 mL/min (by C-G formula based on SCr of 0.82 mg/dL). Liver Function Tests: Recent Labs  Lab 01/16/18 0446  AST 34  ALT 41  ALKPHOS 137*  BILITOT 0.8  PROT 6.5  ALBUMIN 3.5   No results  for input(s): LIPASE, AMYLASE in the last 168 hours. No results for input(s): AMMONIA in the last 168 hours. Coagulation Profile: No results for input(s): INR, PROTIME in the last 168 hours. Cardiac Enzymes: No results for input(s): CKTOTAL, CKMB, CKMBINDEX, TROPONINI in the last 168 hours. BNP (last 3 results) No results for input(s): PROBNP in the last 8760 hours. HbA1C: No results for input(s): HGBA1C in the last 72 hours. CBG: No results for input(s): GLUCAP in the last 168 hours. Lipid Profile: No results for input(s): CHOL, HDL, LDLCALC, TRIG, CHOLHDL, LDLDIRECT in the last 72 hours. Thyroid Function Tests: No results for input(s): TSH, T4TOTAL, FREET4, T3FREE, THYROIDAB in the last 72 hours. Anemia Panel: No results for input(s): VITAMINB12, FOLATE, FERRITIN, TIBC, IRON, RETICCTPCT in the last 72 hours. Urine analysis:    Component Value Date/Time   COLORURINE YELLOW 11/21/2017 1026   APPEARANCEUR CLEAR 11/21/2017 1026   LABSPEC <=1.005 (A) 11/21/2017 1026   PHURINE 6.5 11/21/2017 1026   GLUCOSEU NEGATIVE 11/21/2017 1026   HGBUR NEGATIVE 11/21/2017 1026   BILIRUBINUR NEGATIVE 11/21/2017 1026   KETONESUR NEGATIVE 11/21/2017 1026   PROTEINUR NEGATIVE 09/14/2013 1312   UROBILINOGEN 0.2 11/21/2017 1026   NITRITE NEGATIVE 11/21/2017 1026   LEUKOCYTESUR NEGATIVE 11/21/2017 1026    Radiological Exams on Admission: Dg Chest Portable 1 View  Result Date: 01/16/2018 CLINICAL DATA:  Initial evaluation for acute cough, weakness. EXAM: PORTABLE CHEST 1 VIEW COMPARISON:  None available. FINDINGS: Cardiac and mediastinal silhouettes within normal limits. Lungs normally inflated. Mild left basilar subsegmental atelectasis. No focal infiltrates. No edema or effusion. No pneumothorax. Osseous structures within normal limits. IMPRESSION: Mild left basilar subsegmental atelectasis. No other active cardiopulmonary disease. Electronically Signed   By: Jeannine Boga M.D.   On: 01/16/2018  04:13    EKG: Independently reviewed.  Normal sinus rhythm, normal axis, normal intervals.  Assessment/Plan Active Problems:   Ambulatory dysfunction  62 year old female who presents with worsening ambulatory dysfunction to the point where she is unable to ambulate.  Her symptoms have been worsening over the last 7 days.  On her initial physical examination blood pressure is 149/82, heart rate 108, respiratory rate 16, oxygen saturation 95%, she has dry mucous membranes, mask facies, lungs clear to auscultation bilaterally, heart S1-S2 present rhythmic, abdomen soft, trace lower extremity edema.  She does have weakness in her lower extremities 2 out of 5, proximally, also has cogwheel rigidity, and tremors.  Sodium 134, potassium 3.3, chloride 102, bicarb 27, glucose 143, BUN 10, creatinine 0.82, white count 12.8, hemoglobin 10.8, 34.1, platelets 338.  Urinalysis with 6-10 white cells, specific gravity 1.010.  Chest radiograph negative for infiltrates.  Patient will be admitted to the medical ward with a working diagnosis of worsening ambulatory dysfunction with extrapyramidal symptoms.  1.  Ambulatory dysfunction with extrapyramidal symptoms/suspected serotonin syndrome.  Will admit patient to the medical ward, will discontinue all psychotropic agents, continue hydration with dextrose  and saline, and multivitamins and thiamine.  Neurochecks every 4 hours and physical therapy evaluation.  Certainly she may have parkinsonism, will need outpatient evaluation with neurology.  2.  History of osteoarthritis.  We will continue enoxaparin for DVT prophylaxis, continue pain control with acetaminophen/oxycodone, physical therapy evaluation.  3.  Dyslipidemia.  Continue rosuvastatin.  4.  COPD.  No signs of acute exacerbation continue Incruse Ellipta.   5.  Hypothyroidism.  Continue levothyroxine.   DVT prophylaxis: enoxaparin  Code Status: full  Family Communication: no family at the bedside     Disposition Plan: med surg   Consults called: none   Admission status: Inpatient.     Viana Sleep Gerome Apley MD [facility] Pager [phone]  If 7PM-7AM, please contact night-coverage [email or web address] Password [email or web address]  01/16/2018, 8:05 AM

## 2018-01-16 NOTE — Progress Notes (Signed)
New Admission Note:   Arrival Method: Bed Mental Orientation: A & O X 4  Telemetry: box 59 Assessment: Completed Skin: MASD to buttocks, Non-blanchable redness to bilateral heels, pink foam applied IV: R AC, saline locked and flushed Pain: 3/10 Tubes: None  Safety Measures: Safety Fall Prevention Plan has been given, discussed and signed Admission: Completed [facility] Orientation: Patient has been orientated to the room, unit and staff.  Family: None present at bedside  Orders have been reviewed and implemented. Will continue to monitor the patient. Call light has been placed within reach and bed alarm has been activated.   Nonie Hoyer MSN, RN-BC Phone number: [phone]

## 2018-01-16 NOTE — ED Triage Notes (Addendum)
Pt BIB [facility] c/o increasing weakness post L knee replacement. Also having chills and tremors today. Temperature is 98.1 for EMS. +Nausea and +Dizziness. Generalized weakness. HR 112

## 2018-01-16 NOTE — ED Notes (Signed)
X-ray at bedside

## 2018-01-16 NOTE — ED Notes (Signed)
Pt unable to provide urine specimen while sitting on bedpan. She said it will probably come out when it comes out. Because of this the pt was asked if she would like to use the pure-wick external catheter, but she was hesitant and refused it.

## 2018-01-16 NOTE — ED Notes (Signed)
Pt had pure wick in place, however suction was not high enough causing pt to soil the bed. Pts linen changed and pt cleaned. Peri care performed. Pure wick placed back on pt. Pt repositioned. Warm blanket provided. Pt was able to provide urine without the need for in and out cath.

## 2018-01-16 NOTE — ED Notes (Signed)
PT currently at bedside with patient.

## 2018-01-16 NOTE — ED Provider Notes (Signed)
[facility] DEPT Provider Note   CSN: [id number] Arrival date & time: 01/16/18  0058     History   Chief Complaint Chief Complaint  Patient presents with  . Weakness  . knee replacement    HPI [name] is a 62 y.o. female.  The history is provided by the patient.  Weakness  This is a new problem. The current episode started more than 2 days ago. The problem has been gradually worsening. There was no focality noted. There has been no fever. Pertinent negatives include no vomiting and no headaches.   Patient with history of bipolar, depression, asthma presents with generalized weakness.  She reports she underwent a left total knee arthroplasty on November 18.  No complications from the surgery.  However over the past 1 to 2 weeks she is been having increasing fatigue and weakness.  She reports multiple episodes of diarrhea for the past several days.  The stool is mostly nonbloody.  No vomiting.  No abdominal pain.  She has had mild cough. She reports she is becoming so weak she is having difficulty going to the restroom.  No falls.  No new pain or changes about her left knee Past Medical History:  Diagnosis Date  . Allergic rhinitis   . Anxiety   . Benign essential tremor    hands  . Bipolar 1 disorder, mixed, moderate ([place])   . Claustrophobia   . Depression   . Eczema   . Frequency of urination   . History of frequent urinary tract infections   . History of gastroesophageal reflux (GERD)    05-25-2017  per pt no issues since hiatal hernia repair 01/ 2018  . History of hiatal hernia   . History of melanoma excision    early 2000s-- BACK  . History of panic attacks   . 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 Hypothyroidism   . Intermittent palpitations    cardiology--  dr Martinique  . Interstitial cystitis   . Limited jaw range of motion    s/p  bilateral TMJ surgery,  age 30s  . Migraines   . Moderate  persistent asthma    pulmologist-- dr Halford Chessman  . OA (osteoarthritis)    both knees  . OSA on CPAP    per last sleep study 09/ 2017  mild OSA, AHI13/hr  . PONV (postoperative nausea and vomiting)   . PVC (premature ventricular contraction)   . Rosacea   . Sensation of pressure in bladder area    per pt intermittant  . TMJ arthralgia   . Urticaria   . Wears glasses   . White coat syndrome without hypertension    05-25-2017  per pt hx hypertension yrs ago, no issues after quitting stressful job    Patient Active Problem List   Diagnosis Date Noted  . OA (osteoarthritis) of knee 12/26/2017  . PVC (premature ventricular contraction) 11/21/2017  . Dysphonia 09/26/2017  . Allergic conjunctivitis 08/01/2017  . Abnormal liver function test 04/04/2017  . Vitamin D deficiency, unspecified 10/13/2016  . Hyperlipidemia 09/07/2016  . Osteoporosis without current pathological fracture 03/22/2016  . Paraesophageal hiatal hernia s/p robotic repair & fundoplication 9/62/9528 41/32/4401  . Bipolar 1 disorder, mixed, moderate ([place]) 01/29/2016  . Class 1 obesity with body mass index (BMI) of 32.0 to 32.9 in adult 01/06/2016  . History of skin cancer 03/10/2015  . Bilateral knee pain 12/06/2014  . Migraines 11/21/2014  .  Increased frequency of urination 05/30/2013  . Tremor 02/08/2012  . OTH D/O MENSTRUATION&OTH ABN BLEED FE GNT TRACT 12/01/2007  . PELVIC PAIN, HX OF 12/01/2007  . Dyspareunia, female 03/24/2007  . OSA on CPAP 01/30/2007  . GERD 11/02/2006  . DERMATITIS, HANDS 11/02/2006  . Cough, persistent 11/02/2006  . Hypothyroidism 10/07/2006  . Essential hypertension 10/07/2006  . RHINITIS, CHRONIC 10/07/2006  . Perennial allergic rhinitis 10/07/2006  . Moderate persistent asthma 10/07/2006  . Irritable bowel syndrome 10/07/2006  . INTERSTITIAL CYSTITIS 10/07/2006  . ROSACEA 10/07/2006  . Osteoarthritis of both knees 10/07/2006  . Disturbance in sleep behavior 10/07/2006  . Personal  history of other malignant neoplasm of skin 10/07/2006    Past Surgical History:  Procedure Laterality Date  . [place] STUDY N/A 09/22/2015   Procedure: [place] STUDY;  Surgeon: Doran Stabler, MD;  Location: [facility];  Service: Gastroenterology;  Laterality: N/A;  . ADENOIDECTOMY    . ANAL FISSURE REPAIR  age 89  (48)  . BREAST EXCISIONAL BIOPSY Right 04-16-2003  dr p. young  [place]   benign  . BUNIONECTOMY Right early 2000s  . CARPAL TUNNEL RELEASE Left age 72 (77)  . CHOLECYSTECTOMY OPEN  age 35  . CYSTO WITH HYDRODISTENSION N/A 06/02/2017   Procedure: CYSTOSCOPY/HYDRODISTENSION OF BLADDER;  Surgeon: Irine Seal, MD;  Location: [facility];  Service: Urology;  Laterality: N/A;  . CYSTO/ HYDRODISTENTION/  INSTILLATION THERAPY  1990s  . DX LAPAROSCOPY/  DX HYSTEROSCOPY/  D & C  08-07-2007   dr dove  [facility]  . ENDOMETRIAL ABLATION W/ NOVASURE  10-21-2008   dr Harolyn Rutherford  [facility]  . ESOPHAGEAL MANOMETRY N/A 09/22/2015   Procedure: ESOPHAGEAL MANOMETRY (EM);  Surgeon: Doran Stabler, MD;  Location: [facility];  Service: Gastroenterology;  Laterality: N/A;  with impedence   . FINGER ARTHROPLASTY Bilateral    left little finger 2016:  right middle finger 06/ 2018  . KNEE ARTHROSCOPY Bilateral x3 left ;  x3  right-- last one age 76 (50)  . NASAL SEPTUM SURGERY  age 13s  . ROBOT ASSISTED REDUCTION PARAESOPHAGEAL HIATAL HERNIA/ TYPE 2 MEDIASTINAL DISSECTION/ PRIMARY HIATAL HERNIA REPAIR/ ANTERIOR & POSTERIOR GASTROPEXY/ NISSEN FUNDOPLICATION  [id number]   DR GROSS  [facility]  . SINOSCOPY    . TEMPOROMANDIBULAR JOINT SURGERY Bilateral x3  -- age 25s   per pt used graft  . TONSILLECTOMY    . TOTAL KNEE ARTHROPLASTY Left 12/26/2017   Procedure: LEFT TOTAL KNEE ARTHROPLASTY;  Surgeon: Gaynelle Arabian, MD;  Location: [facility];  Service: Orthopedics;  Laterality: Left;  2min  . TRANSTHORACIC ECHOCARDIOGRAM  12-06-2017   dr Martinique   ef 55-60%, grade 1 diastoilc dysfunction, trivial  MR  . TRIGGER FINGER RELEASE Bilateral last one 2017   several release's bilaterally  . ULNAR NERVE TRANSPOSITION Bilateral age 65 (69)     OB History    Gravida  0   Para      Term      Preterm      AB      Living        SAB      TAB      Ectopic      Multiple      Live Births               Home Medications    Prior to Admission medications   Medication Sig Start Date End Date Taking? Authorizing Provider  albuterol (PROVENTIL HFA;VENTOLIN HFA) 108 (90 Base) MCG/ACT inhaler Inhale 1-2 puffs into the lungs daily as needed for wheezing or shortness of breath. 08/02/17  Yes Bobbitt, Sedalia Muta, MD  albuterol (PROVENTIL) (2.5 MG/3ML) 0.083% nebulizer solution TAKE 3 MLS BY NEBULIZATION EVERY 6 HOURS AS NEEDED FOR WHEEZING OR SHORTNESS OF BREATH. Patient taking differently: Take 2.5 mg by nebulization every 6 (six) hours as needed for wheezing or shortness of breath.  10/24/17  Yes Bobbitt, Sedalia Muta, MD  alendronate (FOSAMAX) 70 MG tablet TAKE 1 TABLET BY MOUTH EVERY 7 DAYS WITH FULL GLASS OF WATER ON EMPTY STOMACH Patient taking differently: Take 70 mg by mouth once a week.  05/20/17  Yes Martinique, Betty G, MD  amitriptyline (ELAVIL) 10 MG tablet Take 10 mg by mouth at bedtime as needed for sleep.  06/16/17  Yes [provider]  anti-nausea (EMETROL) solution Take 30 mLs by mouth every 15 (fifteen) minutes as needed for nausea or vomiting.   Yes [provider]  Azelastine HCl 0.15 % SOLN PLACE 2 SPRAYS INTO THE NOSE DAILY AS NEEDED. Patient taking differently: Place 2 sprays into the nose daily as needed (congestion).  12/20/17  Yes Bobbitt, Sedalia Muta, MD  Carbinoxamine Maleate 4 MG TABS TAKE 1 TABLET BY MOUTH EVERY 8 HOURS AS NEEDED Patient taking differently: Take 4 mg by mouth every 8 (eight) hours as needed (allergies).  12/12/17  Yes Bobbitt, Sedalia Muta, MD  ELMIRON 100 MG capsule Take 100 mg by mouth 2 (two) times daily.  06/21/17  Yes  [provider]  estradiol (ESTRACE) 0.1 MG/GM vaginal cream Place 1 Applicatorful vaginally once a week. 10/25/17  Yes Martinique, Betty G, MD  fluticasone (FLOVENT HFA) 220 MCG/ACT inhaler TAKE 2 PUFFS BY MOUTH TWICE A DAY Patient taking differently: Inhale 2 puffs into the lungs 2 (two) times daily.  09/26/17  Yes Bobbitt, Sedalia Muta, MD  gabapentin (NEURONTIN) 300 MG capsule Take 300 mg by mouth 3 (three) times daily.    Yes [provider]  hydrocortisone cream 1 % Apply 1 application topically 2 (two) times daily as needed for itching.    Yes [provider]  hydrOXYzine (ATARAX/VISTARIL) 25 MG tablet Take 25 mg by mouth daily after breakfast.  10/06/17  Yes [provider]  levothyroxine (SYNTHROID, LEVOTHROID) 50 MCG tablet TAKE 1 TABLET EVERY DAY--- takes in am Patient taking differently: Take 50 mcg by mouth daily before breakfast.  07/25/17  Yes Martinique, Betty G, MD  loperamide (IMODIUM A-D) 2 MG tablet Take 2-4 mg by mouth 4 (four) times daily as needed for diarrhea or loose stools.    Yes [provider]  methocarbamol (ROBAXIN) 500 MG tablet Take 1 tablet (500 mg total) by mouth every 6 (six) hours as needed for muscle spasms. 12/27/17  Yes Edmisten, Kristie L, PA  mometasone (NASONEX) 50 MCG/ACT nasal spray Place 2 sprays into the nose every morning.    Yes [provider]  montelukast (SINGULAIR) 10 MG tablet Take 1 tablet (10 mg total) by mouth at bedtime. Patient taking differently: Take 10 mg by mouth at bedtime.  10/21/16  Yes Parrett, Tammy S, NP  olopatadine (PATANOL) 0.1 % ophthalmic solution Place 1 drop into both eyes 2 (two) times daily as needed for allergies.   Yes [provider]  oxyCODONE (OXY IR/ROXICODONE) 5 MG immediate release tablet Take 1-2 tablets (5-10 mg total) by mouth every 6 (six) hours as needed for moderate pain (pain score 4-6).  12/27/17  Yes Edmisten, Kristie L, PA  primidone (MYSOLINE) 50 MG tablet  Take 1 tablet (50 mg total) by mouth 2 (two) times daily. 01/12/18  Yes Tat, Eustace Quail, DO  rivaroxaban (XARELTO) 10 MG TABS tablet Take 1 tablet (10 mg total) by mouth daily with breakfast for 19 days. Take one 10 mg xarelto once a day for three weeks following surgery to prevent blood clots. 12/28/17 01/16/18 Yes Edmisten, Kristie L, PA  rizatriptan (MAXALT-MLT) 10 MG disintegrating tablet TAKE 1 TABLET EVERY DAY AS NEEDED FOR MIGRAINE, MAY REPEAT IN 2 HRS IF NEEDED. MAX 2 DOSES/WK Patient taking differently: Take 10 mg by mouth every 2 (two) hours as needed for migraine.  09/20/17  Yes Martinique, Betty G, MD  rosuvastatin (CRESTOR) 20 MG tablet TAKE 1 TABLET BY MOUTH EVERY DAY Patient taking differently: Take 20 mg by mouth every morning.  07/25/17  Yes Martinique, Betty G, MD  sertraline (ZOLOFT) 100 MG tablet Take 100 mg by mouth at bedtime.    Yes [provider]  umeclidinium bromide (INCRUSE ELLIPTA) 62.5 MCG/INH AEPB Inhale 1 puff into the lungs daily. 10/05/17  Yes Bobbitt, Sedalia Muta, MD  ziprasidone (GEODON) 40 MG capsule Take 40 mg by mouth 2 (two) times daily. Morning & with supper 10/10/17  Yes [provider]  ketoconazole (NIZORAL) 2 % shampoo Apply 1 application topically 2 (two) times a week. 12/05/17   [provider]  Tiotropium Bromide Monohydrate (SPIRIVA RESPIMAT) 1.25 MCG/ACT AERS Inhale 2 puffs into the lungs daily. Patient not taking: Reported on 01/16/2018 12/19/17   Bobbitt, Sedalia Muta, MD  Zoster Vaccine Adjuvanted [place]) injection 0.5 ml in muscle and repeat in 8 weeks Patient not taking: Reported on 01/16/2018 12/19/17   Martinique, Betty G, MD    Family History Family History  Problem Relation Age of Onset  . Hypertension Mother        deceased from MVA complications  . Thyroid disease Mother   . Allergic rhinitis Mother   . Testicular cancer Father   . Allergic rhinitis Father   . Colon polyps Sister   . Coronary artery disease Unknown      Social History Social History   Tobacco Use  . Smoking status: Never Smoker  . Smokeless tobacco: Never Used  Substance Use Topics  . Alcohol use: Yes    Alcohol/week: 0.0 standard drinks    Comment: once every 3-4 months  . Drug use: No     Allergies   Indomethacin; Amlodipine; Pseudoephedrine; Risperidone and related; Bupivacaine; Pentazocine; Propoxyphene; Sulfa antibiotics; Tramadol; Aspirin; Codeine; Etodolac; Ivp dye [iodinated diagnostic agents]; and Propranolol   Review of Systems Review of Systems  Constitutional: Positive for chills and fatigue.  Respiratory: Positive for cough.   Gastrointestinal: Positive for diarrhea. Negative for vomiting.  Neurological: Positive for weakness. Negative for headaches.  All other systems reviewed and are negative.    Physical Exam Updated Vital Signs BP (!) 150/81   Pulse (!) 113   Temp 98.6 F (37 C) (Oral)   Resp 15   Ht 1.524 m (5')   Wt 68.5 kg   LMP 03/05/2012 (Approximate)   SpO2 97%   BMI 29.49 kg/m   Physical Exam  CONSTITUTIONAL: Well developed, flat affect HEAD: Normocephalic/atraumatic EYES: EOMI/PERRL ENMT: Mucous membranes dry NECK: supple no meningeal signs SPINE/BACK:entire spine nontender CV: S1/S2 noted, no murmurs/rubs/gallops noted LUNGS: Lungs are clear to auscultation bilaterally, no apparent distress ABDOMEN: soft, nontender, no rebound or guarding, bowel sounds  noted throughout abdomen GU:no cva tenderness NEURO: Pt is awake/alert/appropriate, moves all extremitiesx4.  No facial droop.   EXTREMITIES: pulses normal/equal, full ROM, no drainage or erythema about the left knee, see photo SKIN: warm, color normal PSYCH: Flat affect  Patient gave verbal permission to utilize photo for medical documentation only The image was not stored on any personal device     ED Treatments / Results  Labs (all labs ordered are listed, but only abnormal results are displayed) Labs Reviewed  CBC  WITH DIFFERENTIAL/PLATELET - Abnormal; Notable for the following components:      Result Value   WBC 12.8 (*)    RBC 3.60 (*)    Hemoglobin 10.8 (*)    HCT 34.1 (*)    Neutro Abs 8.7 (*)    Monocytes Absolute 1.3 (*)    All other components within normal limits  COMPREHENSIVE METABOLIC PANEL - Abnormal; Notable for the following components:   Potassium 3.3 (*)    Glucose, Bld 143 (*)    Alkaline Phosphatase 137 (*)    All other components within normal limits  URINALYSIS, ROUTINE W REFLEX MICROSCOPIC    EKG EKG Interpretation  Date/Time:  Monday January 16 2018 03:46:48 EST Ventricular Rate:  109 PR Interval:    QRS Duration: 66 QT Interval:  328 QTC Calculation: 442 R Axis:   31 Text Interpretation:  Sinus tachycardia Probable left atrial enlargement Confirmed by Ripley Fraise [phone]) on 01/16/2018 4:03:52 AM   Radiology Dg Chest Portable 1 View  Result Date: 01/16/2018 CLINICAL DATA:  Initial evaluation for acute cough, weakness. EXAM: PORTABLE CHEST 1 VIEW COMPARISON:  None available. FINDINGS: Cardiac and mediastinal silhouettes within normal limits. Lungs normally inflated. Mild left basilar subsegmental atelectasis. No focal infiltrates. No edema or effusion. No pneumothorax. Osseous structures within normal limits. IMPRESSION: Mild left basilar subsegmental atelectasis. No other active cardiopulmonary disease. Electronically Signed   By: Jeannine Boga M.D.   On: 01/16/2018 04:13    Procedures Procedures   Medications Ordered in ED Medications  sodium chloride 0.9 % bolus 1,000 mL (0 mLs Intravenous Stopped 01/16/18 0514)  fentaNYL (SUBLIMAZE) injection 50 mcg (50 mcg Intravenous Given 01/16/18 0551)  sodium chloride 0.9 % bolus 1,000 mL (1,000 mLs Intravenous New Bag/Given 01/16/18 0551)  ondansetron (ZOFRAN) injection 4 mg (4 mg Intravenous Given 01/16/18 0615)     Initial Impression / Assessment and Plan / ED Course  I have reviewed the triage vital  signs and the nursing notes.  Pertinent labs & imaging results that were available during my care of the patient were reviewed by me and considered in my medical decision making (see chart for details).     4:53 AM Patient presents with diarrhea and generalized weakness since having left total knee arthroplasty.  The knee wound looks well-healed.  However she does appear globally weak.  She is also tachycardic.  IV fluids and labs have been ordered 8:03 AM Patient reports continued generalized weakness.  She can move all extremities, but limited and no focal weakness noted   She still reports difficulty urinating.  Strong suspicion for UTI.  She may also be deconditioned from her recent surgery as she reports not having recent PT sessions.  Nevertheless, she would have very difficult managing herself at home.  I discussed with Dr. Cathlean Sauer for admission.  Urinalysis is pending at this time.  If there is no signs of UTI, will consider neurologic evaluation (guillian barre?), I would also recommend physical therapy Final  Clinical Impressions(s) / ED Diagnoses   Final diagnoses:  Weakness  Dehydration    ED Discharge Orders    None       Ripley Fraise, MD 01/16/18 [phone]

## 2018-01-17 ENCOUNTER — Other Ambulatory Visit: Payer: Self-pay | Admitting: Family Medicine

## 2018-01-17 DIAGNOSIS — N3000 Acute cystitis without hematuria: Secondary | ICD-10-CM

## 2018-01-17 DIAGNOSIS — E860 Dehydration: Secondary | ICD-10-CM

## 2018-01-17 DIAGNOSIS — N952 Postmenopausal atrophic vaginitis: Secondary | ICD-10-CM

## 2018-01-17 DIAGNOSIS — E039 Hypothyroidism, unspecified: Secondary | ICD-10-CM

## 2018-01-17 DIAGNOSIS — I10 Essential (primary) hypertension: Secondary | ICD-10-CM

## 2018-01-17 DIAGNOSIS — R531 Weakness: Secondary | ICD-10-CM

## 2018-01-17 DIAGNOSIS — G2111 Neuroleptic induced parkinsonism: Secondary | ICD-10-CM

## 2018-01-17 LAB — CK: Total CK: 137 U/L (ref 38–234)

## 2018-01-17 LAB — CBC
HCT: 34.4 % — ABNORMAL LOW (ref 36.0–46.0)
Hemoglobin: 10.6 g/dL — ABNORMAL LOW (ref 12.0–15.0)
MCH: 29.3 pg (ref 26.0–34.0)
MCHC: 30.8 g/dL (ref 30.0–36.0)
MCV: 95 fL (ref 80.0–100.0)
Platelets: 282 10*3/uL (ref 150–400)
RBC: 3.62 MIL/uL — ABNORMAL LOW (ref 3.87–5.11)
RDW: 13.7 % (ref 11.5–15.5)
WBC: 9.7 10*3/uL (ref 4.0–10.5)
nRBC: 0 % (ref 0.0–0.2)

## 2018-01-17 LAB — BASIC METABOLIC PANEL
Anion gap: 7 (ref 5–15)
BUN: 6 mg/dL — ABNORMAL LOW (ref 8–23)
CO2: 30 mmol/L (ref 22–32)
Calcium: 8.4 mg/dL — ABNORMAL LOW (ref 8.9–10.3)
Chloride: 103 mmol/L (ref 98–111)
Creatinine, Ser: 0.69 mg/dL (ref 0.44–1.00)
GFR calc Af Amer: >60 mL/min (ref >60)
GFR calc non Af Amer: >60 mL/min (ref >60)
GLUCOSE: 152 mg/dL — AB (ref 70–99)
Potassium: 3.6 mmol/L (ref 3.5–5.1)
Sodium: 140 mmol/L (ref 135–145)

## 2018-01-17 LAB — TSH: TSH: 0.603 u[IU]/mL (ref 0.350–4.500)

## 2018-01-17 LAB — HIV ANTIBODY (ROUTINE TESTING W REFLEX): HIV Screen 4th Generation wRfx: NONREACTIVE

## 2018-01-17 MED ORDER — SERTRALINE HCL 100 MG PO TABS
100.0000 mg | ORAL_TABLET | Freq: Every day | ORAL | Status: DC
  Administered 2018-01-17 – 2018-01-23 (×7): 100 mg via ORAL
  Filled 2018-01-17 (×7): qty 1

## 2018-01-17 MED ORDER — CARBIDOPA-LEVODOPA 10-100 MG PO TABS
0.5000 | ORAL_TABLET | Freq: Three times a day (TID) | ORAL | Status: DC
  Filled 2018-01-17: qty 0.5

## 2018-01-17 MED ORDER — AMITRIPTYLINE HCL 10 MG PO TABS: 10.0000 mg | ORAL_TABLET | Freq: Every evening | ORAL | Status: DC | PRN

## 2018-01-17 MED ORDER — SODIUM CHLORIDE 0.9 % IV SOLN
1.0000 g | INTRAVENOUS | Status: AC
  Administered 2018-01-17 – 2018-01-19 (×3): 1 g via INTRAVENOUS
  Filled 2018-01-17 (×3): qty 1

## 2018-01-17 MED ORDER — PENTOSAN POLYSULFATE SODIUM 100 MG PO CAPS
100.0000 mg | ORAL_CAPSULE | Freq: Two times a day (BID) | ORAL | Status: DC
  Administered 2018-01-17 – 2018-01-24 (×14): 100 mg via ORAL
  Filled 2018-01-17 (×15): qty 1

## 2018-01-17 NOTE — Progress Notes (Signed)
PROGRESS NOTE    [name]  TOI:[id number] DOB: 01-Apr-1955 DOA: 01/16/2018 PCP: [place], Betty G, MD      Brief Narrative:  [name] is a 62 y.o. F with HTN, OSA, hypothyroidism, and Bipolar disorder on Geodon and Zoloft who presents with progressive inability to walk.  This has been a slowly progressive problem, worse in the last 1-2 weeks (patient had left TKA in mid November).  In the ER, the patient appeared dehydrated and with dysuria to EDP, started on IV fluids; to admitting phyisician was notably rigid on exam, masked facies.      Assessment & Plan:  Dyskinesia, rigidity Patient followed in Movement disorders clinic since 2017 for tremor.  Those notes indicate symptomatic improvement with primidone, also highlight some mild parkinsonism more recently, attributed to [place].  The differential at this point probably includes parkinsonism from Geodon, Parkinson's disease, catatonia/psychosomatic bradykinesia, less likely serotonin syndrome, small vessel cerebrovascular disease.  UTI may be contributing, dehydration is not contributing, as she is no better after fluids overnight.  No fever to suggest NMS. -Consult Neurology, appreciate expert cares -Check CK, TSH   Bipolar disorder -Hold Geodon -If Neurology do not suspect Serotonin syndrome, will restart amitriptyline, sertraline  UTI Patient with dysuria, pyuria on UA. -Obtain urine culture -Start Ceftriaxone IV -Restart pentosan  Recent knee replacement -PT evaluation  COPD Patient currently unable to inhale properly for Incruse. -RT consult for symptomatic treatment.  Hypothyroidism -Continue levothyroxine  Other medications -Continue primidone -Continue hydroxyzine -Continue Crestor -Hold gabapentin      MDM and disposition: The below labs and imaging reports were reviewed and summarized above.  Medication management as above.  The patient was admitted with bradykinesia now unable to walk.   Will consult Neurology to confirm suspicion of Parkinsonism from outside Neurologist.    The patient is persistently tachycardic >100 bpm, will continue IV fluids.  She further has a new inability to walk that is acute, and for which the number of diagnostic considerations is extensive.  Will consult Neurology for forther evaluation.    DVT prophylaxis: Lovenox Code Status: FULL Family Communication: None present    Consultants:   Neurology  Procedures:   None  Antimicrobials:   Ceftriaxone 12/10 >>   Subjective: Feels very stiff.  No change from yesterday.  Notes dysuria.  Notes tremor.  No fever overnight, no seizures, no confusion, no halucinations.  No vomiting, diarrhea.  Objective: Vitals:   01/17/18 0635 01/17/18 0823 01/17/18 1020 01/17/18 1411  BP: (!) 147/88   (!) 154/83  Pulse: 94   100  Resp: 18   18  Temp: 98.2 F (36.8 C)   98.1 F (36.7 C)  TempSrc:    Oral  SpO2: 96% 96% 95% 95%  Weight:      Height:        Intake/Output Summary (Last 24 hours) at 01/17/2018 1629 Last data filed at 01/17/2018 0915 Gross per 24 hour  Intake -  Output 1250 ml  Net -1250 ml   Filed Weights   01/16/18 0115  Weight: 68.5 kg    Examination: General appearance:  adult female, awake but bradykinetic, severely, no obvious distress.   HEENT: Anicteric, conjunctiva pink, lids and lashes normal. No nasal deformity, discharge, epistaxis.  Lips moist, OP tacky dry, no oral lesions, hearing normal.   Skin: Warm and dry.  No jaundice.  No suspicious rashes or lesions. Cardiac: Tachycardic, regular, nl S1-S2, no murmurs appreciated.  Capillary refill is brisk.  JVPnot visible.  No LE edema.  Radia  pulses 2+ and symmetric. Respiratory: Normal respiratory rate and rhythm.  CTAB without rales or wheezes. Abdomen: Abdomen soft.  no TTP. No ascites, distension, hepatosplenomegaly.   MSK: The left knee incision is clean dry and intact Neuro: Awake and alert.  EOMI, masked  facies, bilateral brachial refelxes hyperreflexic, unable to elicit at knees, stiff in legs severely, less so in arms, but appears to be cogwheeling.  Speech muffled.   Severe psychomotor slowing. Psych: Sensorium intact and responding to questions, attention normal. Affect blunted.  Judgment and insight appear normal.    Data Reviewed: I have personally reviewed following labs and imaging studies:  CBC: Recent Labs  Lab 01/16/18 0446 01/17/18 0617  WBC 12.8* 9.7  NEUTROABS 8.7*  --   HGB 10.8* 10.6*  HCT 34.1* 34.4*  MCV 94.7 95.0  PLT 338 342   Basic Metabolic Panel: Recent Labs  Lab 01/16/18 0446 01/17/18 0617  NA 139 140  K 3.3* 3.6  CL 102 103  CO2 27 30  GLUCOSE 143* 152*  BUN 10 6*  CREATININE 0.82 0.69  CALCIUM 9.1 8.4*   GFR: Estimated Creatinine Clearance: 63.8 mL/min (by C-G formula based on SCr of 0.69 mg/dL). Liver Function Tests: Recent Labs  Lab 01/16/18 0446  AST 34  ALT 41  ALKPHOS 137*  BILITOT 0.8  PROT 6.5  ALBUMIN 3.5   No results for input(s): LIPASE, AMYLASE in the last 168 hours. No results for input(s): AMMONIA in the last 168 hours. Coagulation Profile: No results for input(s): INR, PROTIME in the last 168 hours. Cardiac Enzymes: No results for input(s): CKTOTAL, CKMB, CKMBINDEX, TROPONINI in the last 168 hours. BNP (last 3 results) No results for input(s): PROBNP in the last 8760 hours. HbA1C: No results for input(s): HGBA1C in the last 72 hours. CBG: No results for input(s): GLUCAP in the last 168 hours. Lipid Profile: No results for input(s): CHOL, HDL, LDLCALC, TRIG, CHOLHDL, LDLDIRECT in the last 72 hours. Thyroid Function Tests: Recent Labs    01/17/18 0758  TSH 0.603   Anemia Panel: No results for input(s): VITAMINB12, FOLATE, FERRITIN, TIBC, IRON, RETICCTPCT in the last 72 hours. Urine analysis:    Component Value Date/Time   COLORURINE YELLOW 01/16/2018 0804   APPEARANCEUR CLOUDY (A) 01/16/2018 0804   LABSPEC  1.010 01/16/2018 0804   PHURINE 6.0 01/16/2018 0804   GLUCOSEU NEGATIVE 01/16/2018 0804   GLUCOSEU NEGATIVE 11/21/2017 1026   HGBUR SMALL (A) 01/16/2018 0804   BILIRUBINUR NEGATIVE 01/16/2018 0804   KETONESUR NEGATIVE 01/16/2018 0804   PROTEINUR NEGATIVE 01/16/2018 0804   UROBILINOGEN 0.2 11/21/2017 1026   NITRITE NEGATIVE 01/16/2018 0804   LEUKOCYTESUR LARGE (A) 01/16/2018 0804   Sepsis Labs: @LABRCNTIP (procalcitonin:4,lacticacidven:4)  )No results found for this or any previous visit (from the past 240 hour(s)).       Radiology Studies: Dg Chest Portable 1 View  Result Date: 01/16/2018 CLINICAL DATA:  Initial evaluation for acute cough, weakness. EXAM: PORTABLE CHEST 1 VIEW COMPARISON:  None available. FINDINGS: Cardiac and mediastinal silhouettes within normal limits. Lungs normally inflated. Mild left basilar subsegmental atelectasis. No focal infiltrates. No edema or effusion. No pneumothorax. Osseous structures within normal limits. IMPRESSION: Mild left basilar subsegmental atelectasis. No other active cardiopulmonary disease. Electronically Signed   By: Jeannine Boga M.D.   On: 01/16/2018 04:13        Scheduled Meds: . budesonide (PULMICORT) nebulizer solution  1 mg Nebulization BID  . enoxaparin (  LOVENOX) injection  40 mg Subcutaneous Q24H  . hydrOXYzine  25 mg Oral QPC breakfast  . levothyroxine  50 mcg Oral Q0600  . montelukast  10 mg Oral QHS  . multivitamin  1 tablet Oral Daily  . primidone  50 mg Oral BID  . rosuvastatin  20 mg Oral q morning - 10a  . thiamine  100 mg Oral Daily  . umeclidinium bromide  1 puff Inhalation Daily   Continuous Infusions: . cefTRIAXone (ROCEPHIN)  IV 1 g (01/17/18 1143)  . dextrose 5 % and 0.9% NaCl 75 mL/hr at 01/17/18 0635     LOS: 1 day    Time spent: 25 minute    Edwin Dada, MD [facility] 01/17/2018, 4:29 PM     Please page through [place]:  [email or web address] Password [email or web address] If 7PM-7AM,  please contact night-coverage    Estimated body mass index is 29.49 kg/m as calculated from the following:   Height as of this encounter: 5' (1.524 m).   Weight as of this encounter: 68.5 kg. Malnutrition Type:   Malnutrition Characteristics:   Nutrition Interventions:      . budesonide (PULMICORT) nebulizer solution  1 mg Nebulization BID  . enoxaparin (LOVENOX) injection  40 mg Subcutaneous Q24H  . hydrOXYzine  25 mg Oral QPC breakfast  . levothyroxine  50 mcg Oral Q0600  . montelukast  10 mg Oral QHS  . multivitamin  1 tablet Oral Daily  . primidone  50 mg Oral BID  . rosuvastatin  20 mg Oral q morning - 10a  . thiamine  100 mg Oral Daily  . umeclidinium bromide  1 puff Inhalation Daily   . cefTRIAXone (ROCEPHIN)  IV 1 g (01/17/18 1143)  . dextrose 5 % and 0.9% NaCl 75 mL/hr at 01/17/18 5035    Current Meds  Medication Sig  . albuterol (PROVENTIL HFA;VENTOLIN HFA) 108 (90 Base) MCG/ACT inhaler Inhale 1-2 puffs into the lungs daily as needed for wheezing or shortness of breath.  Marland Kitchen albuterol (PROVENTIL) (2.5 MG/3ML) 0.083% nebulizer solution TAKE 3 MLS BY NEBULIZATION EVERY 6 HOURS AS NEEDED FOR WHEEZING OR SHORTNESS OF BREATH. (Patient taking differently: Take 2.5 mg by nebulization every 6 (six) hours as needed for wheezing or shortness of breath. )  . alendronate (FOSAMAX) 70 MG tablet TAKE 1 TABLET BY MOUTH EVERY 7 DAYS WITH FULL GLASS OF WATER ON EMPTY STOMACH (Patient taking differently: Take 70 mg by mouth once a week. )  . amitriptyline (ELAVIL) 10 MG tablet Take 10 mg by mouth at bedtime as needed for sleep.   Marland Kitchen anti-nausea (EMETROL) solution Take 30 mLs by mouth every 15 (fifteen) minutes as needed for nausea or vomiting.  . Azelastine HCl 0.15 % SOLN PLACE 2 SPRAYS INTO THE NOSE DAILY AS NEEDED. (Patient taking differently: Place 2 sprays into the nose daily as needed (congestion). )  . Carbinoxamine Maleate 4 MG TABS TAKE 1 TABLET BY MOUTH EVERY 8 HOURS AS NEEDED  (Patient taking differently: Take 4 mg by mouth every 8 (eight) hours as needed (allergies). )  . ELMIRON 100 MG capsule Take 100 mg by mouth 2 (two) times daily.   Marland Kitchen estradiol (ESTRACE) 0.1 MG/GM vaginal cream Place 1 Applicatorful vaginally once a week.  . fluticasone (FLOVENT HFA) 220 MCG/ACT inhaler TAKE 2 PUFFS BY MOUTH TWICE A DAY (Patient taking differently: Inhale 2 puffs into the lungs 2 (two) times daily. )  . gabapentin (NEURONTIN) 300 MG capsule Take 300 mg by mouth 3 (  three) times daily.   . hydrocortisone cream 1 % Apply 1 application topically 2 (two) times daily as needed for itching.   . hydrOXYzine (ATARAX/VISTARIL) 25 MG tablet Take 25 mg by mouth daily after breakfast.   . levothyroxine (SYNTHROID, LEVOTHROID) 50 MCG tablet TAKE 1 TABLET EVERY DAY--- takes in am (Patient taking differently: Take 50 mcg by mouth daily before breakfast. )  . loperamide (IMODIUM A-D) 2 MG tablet Take 2-4 mg by mouth 4 (four) times daily as needed for diarrhea or loose stools.   . methocarbamol (ROBAXIN) 500 MG tablet Take 1 tablet (500 mg total) by mouth every 6 (six) hours as needed for muscle spasms.  . mometasone (NASONEX) 50 MCG/ACT nasal spray Place 2 sprays into the nose every morning.   . montelukast (SINGULAIR) 10 MG tablet Take 1 tablet (10 mg total) by mouth at bedtime. (Patient taking differently: Take 10 mg by mouth at bedtime. )  . olopatadine (PATANOL) 0.1 % ophthalmic solution Place 1 drop into both eyes 2 (two) times daily as needed for allergies.  Marland Kitchen oxyCODONE (OXY IR/ROXICODONE) 5 MG immediate release tablet Take 1-2 tablets (5-10 mg total) by mouth every 6 (six) hours as needed for moderate pain (pain score 4-6).  Marland Kitchen primidone (MYSOLINE) 50 MG tablet Take 1 tablet (50 mg total) by mouth 2 (two) times daily.  . rivaroxaban (XARELTO) 10 MG TABS tablet Take 1 tablet (10 mg total) by mouth daily with breakfast for 19 days. Take one 10 mg xarelto once a day for three weeks following  surgery to prevent blood clots.  . rizatriptan (MAXALT-MLT) 10 MG disintegrating tablet TAKE 1 TABLET EVERY DAY AS NEEDED FOR MIGRAINE, MAY REPEAT IN 2 HRS IF NEEDED. MAX 2 DOSES/WK (Patient taking differently: Take 10 mg by mouth every 2 (two) hours as needed for migraine. )  . rosuvastatin (CRESTOR) 20 MG tablet TAKE 1 TABLET BY MOUTH EVERY DAY (Patient taking differently: Take 20 mg by mouth every morning. )  . sertraline (ZOLOFT) 100 MG tablet Take 100 mg by mouth at bedtime.   Marland Kitchen umeclidinium bromide (INCRUSE ELLIPTA) 62.5 MCG/INH AEPB Inhale 1 puff into the lungs daily.  . ziprasidone (GEODON) 40 MG capsule Take 40 mg by mouth 2 (two) times daily. Morning & with supper   acetaminophen **OR** acetaminophen, albuterol, ondansetron **OR** ondansetron (ZOFRAN) IV, oxyCODONE

## 2018-01-17 NOTE — Progress Notes (Addendum)
Patient unable to take Incruse inhaler. Inspiratory flow not efficient enough to obtain benefits of DPI. Attending MD- Dr. Loleta Books made aware.

## 2018-01-17 NOTE — Clinical Social Work Note (Signed)
Clinical Social Work Assessment  Patient Details  Name: [name] MRN: [id number] Date of Birth: 1955/09/18  Date of referral:  01/17/18               Reason for consult:  Facility Placement                Permission sought to share information with:  Chartered certified accountant granted to share information::  No  Name::        Agency::     Relationship::     Contact Information:     Housing/Transportation Living arrangements for the past 2 months:  Single Family Home Source of Information:  Patient Patient Interpreter Needed:  None Criminal Activity/Legal Involvement Pertinent to Current Situation/Hospitalization:  No - Comment as needed Significant Relationships:  Friend Lives with:  Self Do you feel safe going back to the place where you live?  No Need for family participation in patient care:  No (Coment)  Care giving concerns:  Patient lives alone and lacks support.   [name] is a 62 y.o. female with medical history significant of anxiety, bipolar, depression, and GERD, who presents with worsening ambulatory dysfunction.  Patient has chronic ambulatory dysfunction, shuffling gate, uses a walker for mobility, but for the last 7 days her symptoms have been more persistent and progressive to the point where she has been bedridden.   Social Worker assessment / plan:  LCSW consulted foe SNF placement.   Patient recently underwent knee surgery and has been staying with a friend. LCSW met at bedside with patient. Patient reports that she lives in a mobile home and has 3 stairs to enter her home. After surgery patient reports that she went to stay with a friend during recovery, but feels short term rehab may be best.   Patient is 3 weeks post op. Patient reports that she currently needs assistance with ADLs. Prior to surgery she was using a walker and driving. She reports that she was not cooking or cleaning.   Patient is agreeable to SNF at dc. Patient  will require pre auth before going to a facility.    Employment status:  Disabled (Comment on whether or not currently receiving Disability) Insurance information:  Managed Medicare PT Recommendations:  [place] / Referral to community resources:     Patient/Family's Response to care:  Patient agreed that SNF is the best option for her at this point. Patient expressed concerns about her current weakness. Patient gave LCSW permission to fax out to facilities. No family present. Patient reports that she doesn't have any family support.   Patient/Family's Understanding of and Emotional Response to Diagnosis, Current Treatment, and Prognosis:  Patient appears to be sad about her current condition and expressed concerns about her weakness. Patient is agreeable to SNF and hopes to get stronger with rehab.   Emotional Assessment Appearance:  Appears older than stated age Attitude/Demeanor/Rapport:    Affect (typically observed):  Calm, Sad Orientation:  Oriented to Self, Oriented to  Time, Oriented to Place Alcohol / Substance use:  Not Applicable Psych involvement (Current and /or in the community):  No (Comment)  Discharge Needs  Concerns to be addressed:  No discharge needs identified Readmission within the last 30 days:  Yes Current discharge risk:  Dependent with Mobility, Lives alone, Lack of support system Barriers to Discharge:  Insurance Authorization, No SNF bed   [place], LCSW 01/17/2018, 2:34 PM

## 2018-01-17 NOTE — NC FL2 (Signed)
[place] LEVEL OF CARE SCREENING TOOL     IDENTIFICATION  Patient Name: [name] Birthdate: April 09, 1955 Sex: female Admission Date (Current Location): 01/16/2018  [facility] and [place] Number:  Herbalist and Address:  [facility],  [place] [address], [place]      Provider Number: [id number]  Attending Physician Name and Address:  Edwin Dada, *  Relative Name and Phone Number:       Current Level of Care: Hospital Recommended Level of Care: [place] Prior Approval Number:    Date Approved/Denied:   PASRR Number: [id number]  Discharge Plan: SNF    Current Diagnoses: Patient Active Problem List   Diagnosis Date Noted  . Ambulatory dysfunction 01/16/2018  . Serotonin syndrome 01/16/2018  . OA (osteoarthritis) of knee 12/26/2017  . PVC (premature ventricular contraction) 11/21/2017  . Dysphonia 09/26/2017  . Allergic conjunctivitis 08/01/2017  . Abnormal liver function test 04/04/2017  . Vitamin D deficiency, unspecified 10/13/2016  . Hyperlipidemia 09/07/2016  . Osteoporosis without current pathological fracture 03/22/2016  . 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 Bipolar 1 disorder, mixed, moderate ([place]) 01/29/2016  . Class 1 obesity with body mass index (BMI) of 32.0 to 32.9 in adult 01/06/2016  . History of skin cancer 03/10/2015  . Bilateral knee pain 12/06/2014  . Migraines 11/21/2014  . Increased frequency of urination 05/30/2013  . Tremor 02/08/2012  . OTH D/O MENSTRUATION&OTH ABN BLEED FE GNT TRACT 12/01/2007  . PELVIC PAIN, HX OF 12/01/2007  . Dyspareunia, female 03/24/2007  . OSA on CPAP 01/30/2007  . GERD 11/02/2006  . DERMATITIS, HANDS 11/02/2006  . Cough, persistent 11/02/2006  . Hypothyroidism 10/07/2006  . Essential hypertension 10/07/2006  . RHINITIS, CHRONIC 10/07/2006  . Perennial allergic rhinitis 10/07/2006  .  Moderate persistent asthma 10/07/2006  . Irritable bowel syndrome 10/07/2006  . INTERSTITIAL CYSTITIS 10/07/2006  . ROSACEA 10/07/2006  . Osteoarthritis of both knees 10/07/2006  . Disturbance in sleep behavior 10/07/2006  . Personal history of other malignant neoplasm of skin 10/07/2006    Orientation RESPIRATION BLADDER Height & Weight     Self, Time, Situation, Place  Normal External catheter Weight: 151 lb (68.5 kg) Height:  5' (152.4 cm)  BEHAVIORAL SYMPTOMS/MOOD NEUROLOGICAL BOWEL NUTRITION STATUS      Continent Diet(See dc summary)  AMBULATORY STATUS COMMUNICATION OF NEEDS Skin   Extensive Assist Verbally Normal                       Personal Care Assistance Level of Assistance  Bathing, Feeding, Dressing Bathing Assistance: Limited assistance Feeding assistance: Independent Dressing Assistance: Limited assistance     Functional Limitations Info  Sight, Hearing, Speech Sight Info: Impaired(Wears glasses) Hearing Info: Adequate Speech Info: Adequate    SPECIAL CARE FACTORS FREQUENCY  PT (By licensed PT)     PT Frequency: 5x/week              Contractures Contractures Info: Not present    Additional Factors Info  Code Status, Allergies Code Status Info: Full Allergies Info: Indomethacin, Amlodipine, Pseudoephedrine, Risperidone And Related, Bupivacaine, Pentazocine, Propoxyphene, Sulfa Antibiotics, Tramadol, Aspirin, Codeine, Etodolac, Ivp Dye Iodinated Diagnostic Agents, Propranolol           Current Medications (01/17/2018):  This is the current hospital active medication list Current Facility-Administered Medications  Medication Dose Route Frequency Provider Last Rate Last Dose  . acetaminophen (TYLENOL) tablet 650  mg  650 mg Oral Q6H PRN Arrien, Jimmy Picket, MD       Or  . acetaminophen (TYLENOL) suppository 650 mg  650 mg Rectal Q6H PRN Arrien, Jimmy Picket, MD      . albuterol (PROVENTIL) (2.5 MG/3ML) 0.083% nebulizer solution 2.5 mg   2.5 mg Nebulization Daily PRN Arrien, Jimmy Picket, MD      . budesonide (PULMICORT) nebulizer solution 1 mg  1 mg Nebulization BID Arrien, Jimmy Picket, MD   1 mg at 01/17/18 8416  . cefTRIAXone (ROCEPHIN) 1 g in sodium chloride 0.9 % 100 mL IVPB  1 g Intravenous Q24H Edwin Dada, MD 200 mL/hr at 01/17/18 1143 1 g at 01/17/18 1143  . dextrose 5 %-0.9 % sodium chloride infusion   Intravenous Continuous Tawni Millers, MD 75 mL/hr at 01/17/18 [id number]    . enoxaparin (LOVENOX) injection 40 mg  40 mg Subcutaneous Q24H Arrien, Jimmy Picket, MD   40 mg at 01/16/18 2310  . hydrOXYzine (ATARAX/VISTARIL) tablet 25 mg  25 mg Oral QPC breakfast Arrien, Jimmy Picket, MD   25 mg at 01/17/18 1133  . levothyroxine (SYNTHROID, LEVOTHROID) tablet 50 mcg  50 mcg Oral Q0600 Tawni Millers, MD   50 mcg at 01/17/18 [id number]  . montelukast (SINGULAIR) tablet 10 mg  10 mg Oral QHS Arrien, Jimmy Picket, MD   10 mg at 01/16/18 2310  . multivitamin (PROSIGHT) tablet 1 tablet  1 tablet Oral Daily Arrien, Jimmy Picket, MD   1 tablet at 01/17/18 1133  . ondansetron (ZOFRAN) tablet 4 mg  4 mg Oral Q6H PRN Arrien, Jimmy Picket, MD       Or  . ondansetron [facility]) injection 4 mg  4 mg Intravenous Q6H PRN Arrien, Jimmy Picket, MD      . oxyCODONE (Oxy IR/ROXICODONE) immediate release tablet 5 mg  5 mg Oral Q6H PRN Arrien, Jimmy Picket, MD   5 mg at 01/17/18 0037  . primidone (MYSOLINE) tablet 50 mg  50 mg Oral BID Tawni Millers, MD   50 mg at 01/17/18 1132  . rosuvastatin (CRESTOR) tablet 20 mg  20 mg Oral q morning - 10a Arrien, Jimmy Picket, MD   20 mg at 01/17/18 1131  . thiamine (VITAMIN B-1) tablet 100 mg  100 mg Oral Daily Arrien, Jimmy Picket, MD   100 mg at 01/17/18 1134  . umeclidinium bromide (INCRUSE ELLIPTA) 62.5 MCG/INH 1 puff  1 puff Inhalation Daily Tawni Millers, MD   1 puff at 01/17/18 1093     Discharge Medications: Please see  discharge summary for a list of discharge medications.  Relevant Imaging Results:  Relevant Lab Results:   Additional Information SSN: [id number]  Servando Snare, LCSW

## 2018-01-17 NOTE — Care Management Note (Signed)
Case Management Note  Patient Details  Name: [name] MRN: [id number] Date of Birth: [birth year]  Subjective/Objective:                  62 y.o. female with significant past medical history including hypertension, TMJ, PVCs, obstructive sleep apnea, osteoarthritis, migraines, hypothyroidism, claustrophobia, bipolar disease, essential tremor.  Patient is a very poor historian and focuses on the fact that she is extremely tired and has difficulty holding her utensils from strength.  I asked her if it was secondary to her tremor she also stated that was a positive issue.  As noted she recently had a left knee replacement thus is having difficulty with her gait.  Testing her left knee it is clear that she has not been working with range of motion as she she only has about 35 degrees of flexion.    I did call Dr. Carles Collet who is her primary neuromuscular specialist.  Dr. Carles Collet did see her in October.  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  For that reason she did make an appointment in 6 months to see if with the Geodon out of her system the tremor improves.  Action/Plan: Continue in outpt physical therapy Following for progression of care and condition. Following for cm needs none present at this time.  Expected Discharge Date:  (unknown)               Expected Discharge Plan:  Home/Self Care  In-House Referral:     Discharge planning Services  CM Consult  Post Acute Care Choice:  Resumption of Svcs/PTA Provider Choice offered to:     DME Arranged:    DME Agency:     HH Arranged:    HH Agency:     Status of Service:  In process, will continue to follow  If discussed at Long Length of Stay Meetings, dates discussed:    Additional Comments:  Leeroy Cha,  RN 01/17/2018, 10:19 AM

## 2018-01-17 NOTE — Consult Note (Addendum)
Neurology Consultation  Reason for Consult: Possible Parkinson's disease versus other Parkinsonism secondary to medication side effect Referring Physician: Dr. Loleta Books  CC: As noted above possible Parkinson's versus parkinsonism  History is obtained from: Patient  HPI: [name] is a 62 y.o. female with significant past medical history including hypertension, TMJ, PVCs, obstructive sleep apnea, osteoarthritis, migraines, hypothyroidism, claustrophobia, bipolar disease, essential tremor.  Patient is a very poor historian and focuses on the fact that she is extremely tired and has difficulty holding her utensils from strength.  I asked her if it was secondary to her tremor she also stated that was a positive issue.  As noted she recently had a left knee replacement thus is having difficulty with her gait.  Testing her left knee it is clear that she has not been working with range of motion as she she only has about 35 degrees of flexion.    I did call Dr. Carles Collet who is her primary neuromuscular specialist.  Dr. Carles Collet did see her in October.  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  For that reason she did make an appointment in 6 months to see if with the Geodon out of her system the tremor improves.   ROS: A 14 point ROS was performed and is negative except as noted in the HPI.   Past Medical History:  Diagnosis Date  . Allergic rhinitis   . Anxiety   . Benign essential tremor    hands  . Bipolar 1 disorder, mixed, moderate ([place])   . Claustrophobia   . Depression   . Eczema   . Frequency of urination   . History of frequent urinary tract infections   . History of gastroesophageal reflux (GERD)    05-25-2017  per pt no issues since hiatal hernia repair 01/ 2018  . History of  hiatal hernia   . History of melanoma excision    early 2000s-- BACK  . History of panic attacks   . 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 Hypothyroidism   . Intermittent palpitations    cardiology--  dr Martinique  . Interstitial cystitis   . Limited jaw range of motion    s/p  bilateral TMJ surgery,  age 78s  . Migraines   . Moderate persistent asthma    pulmologist-- dr Halford Chessman  . OA (osteoarthritis)    both knees  . OSA on CPAP    per last sleep study 09/ 2017  mild OSA, AHI13/hr  . PONV (postoperative nausea and vomiting)   . PVC (premature ventricular contraction)   . Rosacea   . Sensation of pressure in bladder area    per pt intermittant  . TMJ arthralgia   . Urticaria   . Wears glasses   . White coat syndrome without hypertension    05-25-2017  per pt hx hypertension yrs ago, no issues after quitting stressful job     Family History  Problem Relation Age of Onset  . Hypertension Mother        deceased from MVA complications  . Thyroid disease Mother   . Allergic rhinitis Mother   . Testicular cancer Father   . Allergic rhinitis Father   . Colon polyps Sister   . Coronary  artery disease Unknown    Social History:   reports that she has never smoked. She has never used smokeless tobacco. She reports that she drinks alcohol. She reports that she does not use drugs.  Medications  Current Facility-Administered Medications:  .  acetaminophen (TYLENOL) tablet 650 mg, 650 mg, Oral, Q6H PRN **OR** acetaminophen (TYLENOL) suppository 650 mg, 650 mg, Rectal, Q6H PRN, Arrien, Jimmy Picket, MD .  albuterol (PROVENTIL) (2.5 MG/3ML) 0.083% nebulizer solution 2.5 mg, 2.5 mg, Nebulization, Daily PRN, Arrien, Jimmy Picket, MD .  budesonide (PULMICORT) nebulizer solution 1 mg, 1 mg, Nebulization, BID, Arrien, Jimmy Picket, MD, 1 mg at 01/17/18 [phone] .  carbidopa-levodopa (SINEMET IR) 10-100 MG per tablet immediate release 0.5 tablet, 0.5 tablet,  Oral, TID, Kerney Elbe, MD .  cefTRIAXone (ROCEPHIN) 1 g in sodium chloride 0.9 % 100 mL IVPB, 1 g, Intravenous, Q24H, Danford, Christopher P, MD .  dextrose 5 %-0.9 % sodium chloride infusion, , Intravenous, Continuous, Arrien, Jimmy Picket, MD, Last Rate: 75 mL/hr at 01/17/18 2694 .  enoxaparin (LOVENOX) injection 40 mg, 40 mg, Subcutaneous, Q24H, Arrien, Jimmy Picket, MD, 40 mg at 01/16/18 2310 .  hydrOXYzine (ATARAX/VISTARIL) tablet 25 mg, 25 mg, Oral, QPC breakfast, Arrien, Jimmy Picket, MD, 25 mg at 01/16/18 1010 .  levothyroxine (SYNTHROID, LEVOTHROID) tablet 50 mcg, 50 mcg, Oral, Q0600, Arrien, Jimmy Picket, MD, 50 mcg at 01/17/18 [phone] .  montelukast (SINGULAIR) tablet 10 mg, 10 mg, Oral, QHS, Arrien, Jimmy Picket, MD, 10 mg at 01/16/18 2310 .  multivitamin (PROSIGHT) tablet 1 tablet, 1 tablet, Oral, Daily, Arrien, Jimmy Picket, MD, 1 tablet at 01/16/18 1607 .  ondansetron (ZOFRAN) tablet 4 mg, 4 mg, Oral, Q6H PRN **OR** ondansetron (ZOFRAN) injection 4 mg, 4 mg, Intravenous, Q6H PRN, Arrien, Jimmy Picket, MD .  oxyCODONE (Oxy IR/ROXICODONE) immediate release tablet 5 mg, 5 mg, Oral, Q6H PRN, Arrien, Jimmy Picket, MD, 5 mg at 01/17/18 0037 .  primidone (MYSOLINE) tablet 50 mg, 50 mg, Oral, BID, Arrien, Jimmy Picket, MD, 50 mg at 01/16/18 2309 .  rosuvastatin (CRESTOR) tablet 20 mg, 20 mg, Oral, q morning - 10a, Arrien, Jimmy Picket, MD, 20 mg at 01/16/18 1607 .  thiamine (VITAMIN B-1) tablet 100 mg, 100 mg, Oral, Daily, Arrien, Jimmy Picket, MD, 100 mg at 01/16/18 1607 .  umeclidinium bromide (INCRUSE ELLIPTA) 62.5 MCG/INH 1 puff, 1 puff, Inhalation, Daily, Arrien, Jimmy Picket, MD, 1 puff at 01/17/18 2703   Exam: Current vital signs: BP (!) 147/88   Pulse 94   Temp 98.2 F (36.8 C)   Resp 18   Ht 5' (1.524 m)   Wt 68.5 kg   LMP 03/05/2012 (Approximate)   SpO2 96%   BMI 29.49 kg/m  Vital signs in last 24 hours: Temp:  [98.2 F (36.8 C)-99.2  F (37.3 C)] 98.2 F (36.8 C) (12/10 0635) Pulse Rate:  [94-108] 94 (12/10 0635) Resp:  [16-20] 18 (12/10 0635) BP: (140-149)/(80-88) 147/88 (12/10 0635) SpO2:  [95 %-98 %] 96 % (12/10 5009)  Physical Exam  Constitutional: Appears somewhat frail for her age.  Psych: flattened, anxious affect Eyes: No scleral injection HENT: No OP obstrucion Head: Normocephalic.  Cardiovascular: Normal rate and regular rhythm.  Respiratory: Effort normal, non-labored breathing GI: Soft.  No distension. There is no tenderness.  Skin: WDI  Neuro: Mental Status: Patient is awake, alert, oriented to person, place, month, year, and situation. Patient is able to give some history but not full No signs of aphasia or neglect Increased  latency of verbal and motor responses.  Anxious, dysthymic affect  Cranial Nerves: II: Visual Fields are full. Pupils are equal, round, and reactive to light.   III,IV, VI: EOMI without ptosis or diploplia.  V: Facial sensation is symmetric to temperature. Hypomimia noted. VII: Facial movement is symmetric.  VIII: hearing is intact to voice X: Uvula elevates symmetrically XI: Shoulder shrug is symmetric. XII: tongue is midline with tremor.  Motor: Tone is increased throughout. Bulk is normal.  4/5 strength in the upper extremities and 2/5 strength lower extremities.  As noted she does have flexion contraction of her left knee likely secondary to immobility and not doing passive range of motion.  Sensory: Sensation is symmetric to light touch and temperature in the arms and legs. Deep Tendon Reflexes: 3+ and symmetric in the biceps no patellar or AJ elicitable Plantars: mute bilaterally Cerebellar: FNF bradykinetic but shows no dysmetria.  She does show a postural tremor with this.  No past pointing.   Labs I have reviewed labs in epic and the results pertinent to this consultation are:   CBC    Component Value Date/Time   WBC 9.7 01/17/2018 0617   RBC 3.62  (L) 01/17/2018 0617   HGB 10.6 (L) 01/17/2018 0617   HCT 34.4 (L) 01/17/2018 0617   PLT 282 01/17/2018 0617   MCV 95.0 01/17/2018 0617   MCH 29.3 01/17/2018 0617   MCHC 30.8 01/17/2018 0617   RDW 13.7 01/17/2018 0617   LYMPHSABS 2.6 01/16/2018 0446   MONOABS 1.3 (H) 01/16/2018 0446   EOSABS 0.1 01/16/2018 0446   BASOSABS 0.0 01/16/2018 0446    CMP     Component Value Date/Time   NA 140 01/17/2018 0617   K 3.6 01/17/2018 0617   CL 103 01/17/2018 0617   CO2 30 01/17/2018 0617   GLUCOSE 152 (H) 01/17/2018 0617   BUN 6 (L) 01/17/2018 0617   CREATININE 0.69 01/17/2018 0617   CREATININE 1.00 11/21/2014 0959   CALCIUM 8.4 (L) 01/17/2018 0617   PROT 6.5 01/16/2018 0446   ALBUMIN 3.5 01/16/2018 0446   AST 34 01/16/2018 0446   ALT 41 01/16/2018 0446   ALKPHOS 137 (H) 01/16/2018 0446   BILITOT 0.8 01/16/2018 0446   GFRNONAA >60 01/17/2018 0617   GFRNONAA 62 11/21/2014 0959   GFRAA >60 01/17/2018 0617   GFRAA 72 11/21/2014 0959    Lipid Panel     Component Value Date/Time   CHOL 163 07/01/2017 1250   TRIG 88.0 07/01/2017 1250   HDL 75.20 07/01/2017 1250   CHOLHDL 2 07/01/2017 1250   VLDL 17.6 07/01/2017 1250   LDLCALC 70 07/01/2017 1250   LDLDIRECT 143.0 09/14/2016 1037     Etta Quill PA-C [facility] [phone] 01/17/2018, 8:51 AM     Assessment: 62 year old female with bradykinesia, flattened facies, depressed anxious affect and recent onset of shuffling gait 1. After talking to her Neurologist Dr. 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  Per Dr. Carles Collet it may take up to 6 months for this to exit her system.  She does have a follow-up appointment in 6 months for Dr. Carles Collet to reevaluate.  For that reason we will not try Sinemet while she is in the hospital and allow Dr. Carles Collet to evaluate on follow-up appointment. 2. Anxiety   Recommendations: - We will hold off on starting Sinemet at this time.  -- Continue off Geodon --  Follow-up with her Neurologist, Dr. Carles Collet  --  Moistened food for diet. Patient complains of difficulty swallowing food "like cardboard going down" -- Head of bed to be more elevated during eating.  -- PT/OT -- Psychiatry consult to assess for possible addition of a non-sedating anxiolytic to her medication regimen   I have interviewed and examined the patient. I have formulated the assessment and plan.  Electronically signed: Dr. Kerney Elbe

## 2018-01-18 LAB — BASIC METABOLIC PANEL
Anion gap: 10 (ref 5–15)
BUN: 7 mg/dL — ABNORMAL LOW (ref 8–23)
CO2: 28 mmol/L (ref 22–32)
Calcium: 8.6 mg/dL — ABNORMAL LOW (ref 8.9–10.3)
Chloride: 102 mmol/L (ref 98–111)
Creatinine, Ser: 0.61 mg/dL (ref 0.44–1.00)
GFR calc Af Amer: >60 mL/min (ref >60)
GFR calc non Af Amer: >60 mL/min (ref >60)
Glucose, Bld: 151 mg/dL — ABNORMAL HIGH (ref 70–99)
Potassium: 3.4 mmol/L — ABNORMAL LOW (ref 3.5–5.1)
Sodium: 140 mmol/L (ref 135–145)

## 2018-01-18 LAB — CBC
HCT: 35.2 % — ABNORMAL LOW (ref 36.0–46.0)
HEMOGLOBIN: 11.1 g/dL — AB (ref 12.0–15.0)
MCH: 29.4 pg (ref 26.0–34.0)
MCHC: 31.5 g/dL (ref 30.0–36.0)
MCV: 93.1 fL (ref 80.0–100.0)
Platelets: 318 10*3/uL (ref 150–400)
RBC: 3.78 MIL/uL — ABNORMAL LOW (ref 3.87–5.11)
RDW: 13.4 % (ref 11.5–15.5)
WBC: 13 10*3/uL — ABNORMAL HIGH (ref 4.0–10.5)
nRBC: 0 % (ref 0.0–0.2)

## 2018-01-18 LAB — MAGNESIUM: Magnesium: 1.8 mg/dL (ref 1.7–2.4)

## 2018-01-18 MED ORDER — CYCLOBENZAPRINE HCL 5 MG PO TABS
5.0000 mg | ORAL_TABLET | Freq: Two times a day (BID) | ORAL | Status: DC | PRN
  Administered 2018-01-18 – 2018-01-22 (×8): 5 mg via ORAL
  Filled 2018-01-18 (×8): qty 1

## 2018-01-18 MED ORDER — METOPROLOL TARTRATE 12.5 MG HALF TABLET
12.5000 mg | ORAL_TABLET | Freq: Two times a day (BID) | ORAL | Status: DC
  Administered 2018-01-18 – 2018-01-24 (×13): 12.5 mg via ORAL
  Filled 2018-01-18 (×13): qty 1

## 2018-01-18 MED ORDER — POTASSIUM CHLORIDE CRYS ER 20 MEQ PO TBCR
20.0000 meq | EXTENDED_RELEASE_TABLET | Freq: Two times a day (BID) | ORAL | Status: AC
  Administered 2018-01-18 (×2): 20 meq via ORAL
  Filled 2018-01-18 (×2): qty 1

## 2018-01-18 NOTE — Progress Notes (Signed)
Patient chose bed at [facility].   LCSW notified facility. Facility started [place].   LCSW will continue to follow.   Carolin Coy [place]

## 2018-01-18 NOTE — Progress Notes (Signed)
PROGRESS NOTE    [name]  NUU:[id number] DOB: 1956-01-02 DOA: 01/16/2018 PCP: [place], Betty G, MD      Brief Narrative:  [name] is a 62 y.o. F with HTN, OSA, hypothyroidism, and Bipolar disorder on Geodon and Zoloft who presents with progressive inability to walk.  This has been a slowly progressive problem, worse in the last 1-2 weeks (patient had left TKA in mid November).  In the ER, the patient appeared dehydrated and with dysuria to EDP, started on IV fluids; to admitting phyisician was notably rigid on exam, masked facies.    Assessment & Plan:  Dyskinesia, rigidity Patient followed in Movement disorders clinic since 2017 for tremor.  Those notes indicate symptomatic improvement with primidone, also highlight some mild parkinsonism more recently, attributed to [place].  The differential at this point probably includes parkinsonism from Geodon, Parkinson's disease, catatonia/psychosomatic bradykinesia, less likely serotonin syndrome, small vessel cerebrovascular disease.  UTI may be contributing, dehydration is not contributing, as she is no better after fluids overnight.  No fever to suggest NMS. -Consult Neurology, appreciate expert cares -Check CK, TSH  01/18/2018: -Neurology following.  Appreciate help. -She is complaining of tremors, muscle spasms. -Ordered Flexeril as needed. -Also ordered to check magnesium level.  Potassium replacement ordered. -CK/TSH normal. -PT/OT consult requested.   Bipolar disorder -Hold Geodon -If Neurology do not suspect Serotonin syndrome, will restart amitriptyline, sertraline 01/18/18: - Mood stable at this time - Continue current meds,  UTI Patient with dysuria, pyuria on UA. -Obtain urine culture -Start Ceftriaxone IV -Restart pentosan  01/18/18: -Urine cultures preliminary report showing E. Coli. -Continue ceftriaxone.  Afebrile at this time.  Follow-up on the final urine cultures.  Recent knee replacement -PT  evaluation  COPD Patient currently unable to inhale properly for Incruse. -RT consult for symptomatic treatment.  Hypothyroidism -Continue levothyroxine  Other medications -Continue primidone -Continue hydroxyzine -Continue Crestor -Hold gabapentin  Hypertension -Patient noted to have high blood pressure. -Started on low-dose metoprolol. -Continue to monitor blood pressure closely and adjust medications as needed.   MDM and disposition: The below labs and imaging reports were reviewed and summarized above.  Medication management as above.  The patient was admitted with bradykinesia now unable to walk.  Neurology consulted. Suspicion of Parkinsonism from outside Neurologist.    The patient is persistently tachycardic >100 bpm, continue IV fluids.  She further has a new inability to walk that is acute, and for which the number of diagnostic considerations is extensive.  Neurology consulted and following.   DVT prophylaxis: Lovenox Code Status: FULL Family Communication: None present   Consultants:   Neurology  Procedures:   None  Antimicrobials:   Ceftriaxone 12/10 >>   Subjective: C/o tremors, muscle spasms.  No fever overnight, no seizures, no confusion, no halucinations.  No vomiting, diarrhea.  Objective: Vitals:   01/17/18 2129 01/18/18 0616 01/18/18 0630 01/18/18 0939  BP:  (!) 166/97    Pulse: 96 (!) 108 (!) 104   Resp:  (!) 24 20   Temp:  98.4 F (36.9 C)    TempSrc:  Oral    SpO2:  95%  97%  Weight:      Height:        Intake/Output Summary (Last 24 hours) at 01/18/2018 1023 Last data filed at 01/18/2018 3474 Gross per 24 hour  Intake 2591.43 ml  Output 1500 ml  Net 1091.43 ml   Filed Weights   01/16/18 0115  Weight: 68.5 kg  Examination: General appearance:  adult female, awake, has tremors, no obvious distress.   HEENT: Anicteric, conjunctiva pink, lids and lashes normal. No nasal deformity, discharge, epistaxis.  Lips moist, OP  tacky dry, no oral lesions, hearing normal.   Skin: Warm and dry.  No jaundice.  No suspicious rashes or lesions. Cardiac: Tachycardic, regular, nl S1-S2, no murmurs appreciated.  Capillary refill is brisk.  JVPnot visible.  No LE edema.  Radia  pulses 2+ and symmetric. Respiratory: Normal respiratory rate and rhythm.  CTAB without rales or wheezes. Abdomen: Abdomen soft.  no TTP. No ascites, distension, hepatosplenomegaly.   MSK: The left knee incision is clean dry and intact Neuro: Awake and alert.  EOMI, masked facies, bilateral brachial refelxes hyperreflexic, unable to elicit at knees, stiff in legs, less so in arms, but appears to be cogwheeling. Has tremors. Speech improving.   Severe psychomotor slowing. Psych: Sensorium intact and responding to questions, attention normal. Affect at this time appears okay.  Judgment and insight appear normal.    Data Reviewed: I have personally reviewed following labs and imaging studies:  CBC: Recent Labs  Lab 01/16/18 0446 01/17/18 0617 01/18/18 0607  WBC 12.8* 9.7 13.0*  NEUTROABS 8.7*  --   --   HGB 10.8* 10.6* 11.1*  HCT 34.1* 34.4* 35.2*  MCV 94.7 95.0 93.1  PLT 338 282 413   Basic Metabolic Panel: Recent Labs  Lab 01/16/18 0446 01/17/18 0617 01/18/18 0607  NA 139 140 140  K 3.3* 3.6 3.4*  CL 102 103 102  CO2 27 30 28   GLUCOSE 143* 152* 151*  BUN 10 6* 7*  CREATININE 0.82 0.69 0.61  CALCIUM 9.1 8.4* 8.6*   GFR: Estimated Creatinine Clearance: 63.8 mL/min (by C-G formula based on SCr of 0.61 mg/dL). Liver Function Tests: Recent Labs  Lab 01/16/18 0446  AST 34  ALT 41  ALKPHOS 137*  BILITOT 0.8  PROT 6.5  ALBUMIN 3.5   No results for input(s): LIPASE, AMYLASE in the last 168 hours. No results for input(s): AMMONIA in the last 168 hours. Coagulation Profile: No results for input(s): INR, PROTIME in the last 168 hours. Cardiac Enzymes: Recent Labs  Lab 01/17/18 1700  CKTOTAL 137   BNP (last 3 results) No  results for input(s): PROBNP in the last 8760 hours. HbA1C: No results for input(s): HGBA1C in the last 72 hours. CBG: No results for input(s): GLUCAP in the last 168 hours. Lipid Profile: No results for input(s): CHOL, HDL, LDLCALC, TRIG, CHOLHDL, LDLDIRECT in the last 72 hours. Thyroid Function Tests: Recent Labs    01/17/18 0758  TSH 0.603   Anemia Panel: No results for input(s): VITAMINB12, FOLATE, FERRITIN, TIBC, IRON, RETICCTPCT in the last 72 hours. Urine analysis:    Component Value Date/Time   COLORURINE YELLOW 01/16/2018 0804   APPEARANCEUR CLOUDY (A) 01/16/2018 0804   LABSPEC 1.010 01/16/2018 0804   PHURINE 6.0 01/16/2018 0804   GLUCOSEU NEGATIVE 01/16/2018 0804   GLUCOSEU NEGATIVE 11/21/2017 1026   HGBUR SMALL (A) 01/16/2018 0804   BILIRUBINUR NEGATIVE 01/16/2018 0804   KETONESUR NEGATIVE 01/16/2018 0804   PROTEINUR NEGATIVE 01/16/2018 0804   UROBILINOGEN 0.2 11/21/2017 1026   NITRITE NEGATIVE 01/16/2018 0804   LEUKOCYTESUR LARGE (A) 01/16/2018 0804   Sepsis Labs: @LABRCNTIP (procalcitonin:4,lacticacidven:4)  ) Recent Results (from the past 240 hour(s))  Culture, Urine     Status: Abnormal (Preliminary result)   Collection Time: 01/17/18  9:10 AM  Result Value Ref Range Status   Specimen Description  Final    URINE, CLEAN CATCH Performed at [facility], [place] [address] [place], [place] [address]    Special Requests   Final    NONE Performed at [facility], [place] [address] [place], [place] [address]    Culture 60,000 COLONIES/mL ESCHERICHIA COLI (A)  Final   Report Status PENDING  Incomplete         Radiology Studies: No results found.      Scheduled Meds: . budesonide (PULMICORT) nebulizer solution  1 mg Nebulization BID  . enoxaparin (LOVENOX) injection  40 mg Subcutaneous Q24H  . hydrOXYzine  25 mg Oral QPC breakfast  . levothyroxine  50 mcg Oral Q0600  . montelukast  10 mg Oral QHS  .  multivitamin  1 tablet Oral Daily  . pentosan polysulfate  100 mg Oral BID  . primidone  50 mg Oral BID  . rosuvastatin  20 mg Oral q morning - 10a  . sertraline  100 mg Oral QHS  . thiamine  100 mg Oral Daily   Continuous Infusions: . cefTRIAXone (ROCEPHIN)  IV 1 g (01/18/18 1005)  . dextrose 5 % and 0.9% NaCl 75 mL/hr at 01/18/18 0300     LOS: 2 days    Time spent: 25 minute    Yaakov Guthrie, MD [facility] 01/18/2018, 10:23 AM     Please page through [place]:  [email or web address] Password [email or web address] If 7PM-7AM, please contact night-coverage    Estimated body mass index is 29.49 kg/m as calculated from the following:   Height as of this encounter: 5' (1.524 m).   Weight as of this encounter: 68.5 kg. Malnutrition Type:   Malnutrition Characteristics:   Nutrition Interventions:      . budesonide (PULMICORT) nebulizer solution  1 mg Nebulization BID  . enoxaparin (LOVENOX) injection  40 mg Subcutaneous Q24H  . hydrOXYzine  25 mg Oral QPC breakfast  . levothyroxine  50 mcg Oral Q0600  . montelukast  10 mg Oral QHS  . multivitamin  1 tablet Oral Daily  . pentosan polysulfate  100 mg Oral BID  . primidone  50 mg Oral BID  . rosuvastatin  20 mg Oral q morning - 10a  . sertraline  100 mg Oral QHS  . thiamine  100 mg Oral Daily   . cefTRIAXone (ROCEPHIN)  IV 1 g (01/18/18 1005)  . dextrose 5 % and 0.9% NaCl 75 mL/hr at 01/18/18 0300    Current Meds  Medication Sig  . albuterol (PROVENTIL HFA;VENTOLIN HFA) 108 (90 Base) MCG/ACT inhaler Inhale 1-2 puffs into the lungs daily as needed for wheezing or shortness of breath.  Marland Kitchen albuterol (PROVENTIL) (2.5 MG/3ML) 0.083% nebulizer solution TAKE 3 MLS BY NEBULIZATION EVERY 6 HOURS AS NEEDED FOR WHEEZING OR SHORTNESS OF BREATH. (Patient taking differently: Take 2.5 mg by nebulization every 6 (six) hours as needed for wheezing or shortness of breath. )  . alendronate (FOSAMAX) 70 MG tablet TAKE 1 TABLET BY MOUTH EVERY 7 DAYS  WITH FULL GLASS OF WATER ON EMPTY STOMACH (Patient taking differently: Take 70 mg by mouth once a week. )  . amitriptyline (ELAVIL) 10 MG tablet Take 10 mg by mouth at bedtime as needed for sleep.   Marland Kitchen anti-nausea (EMETROL) solution Take 30 mLs by mouth every 15 (fifteen) minutes as needed for nausea or vomiting.  . Azelastine HCl 0.15 % SOLN PLACE 2 SPRAYS INTO THE NOSE DAILY AS NEEDED. (Patient taking differently: Place 2 sprays into the nose daily  as needed (congestion). )  . Carbinoxamine Maleate 4 MG TABS TAKE 1 TABLET BY MOUTH EVERY 8 HOURS AS NEEDED (Patient taking differently: Take 4 mg by mouth every 8 (eight) hours as needed (allergies). )  . ELMIRON 100 MG capsule Take 100 mg by mouth 2 (two) times daily.   . fluticasone (FLOVENT HFA) 220 MCG/ACT inhaler TAKE 2 PUFFS BY MOUTH TWICE A DAY (Patient taking differently: Inhale 2 puffs into the lungs 2 (two) times daily. )  . gabapentin (NEURONTIN) 300 MG capsule Take 300 mg by mouth 3 (three) times daily.   . hydrocortisone cream 1 % Apply 1 application topically 2 (two) times daily as needed for itching.   . hydrOXYzine (ATARAX/VISTARIL) 25 MG tablet Take 25 mg by mouth daily after breakfast.   . loperamide (IMODIUM A-D) 2 MG tablet Take 2-4 mg by mouth 4 (four) times daily as needed for diarrhea or loose stools.   . methocarbamol (ROBAXIN) 500 MG tablet Take 1 tablet (500 mg total) by mouth every 6 (six) hours as needed for muscle spasms.  . mometasone (NASONEX) 50 MCG/ACT nasal spray Place 2 sprays into the nose every morning.   . montelukast (SINGULAIR) 10 MG tablet Take 1 tablet (10 mg total) by mouth at bedtime. (Patient taking differently: Take 10 mg by mouth at bedtime. )  . olopatadine (PATANOL) 0.1 % ophthalmic solution Place 1 drop into both eyes 2 (two) times daily as needed for allergies.  Marland Kitchen oxyCODONE (OXY IR/ROXICODONE) 5 MG immediate release tablet Take 1-2 tablets (5-10 mg total) by mouth every 6 (six) hours as needed for  moderate pain (pain score 4-6).  Marland Kitchen primidone (MYSOLINE) 50 MG tablet Take 1 tablet (50 mg total) by mouth 2 (two) times daily.  . rivaroxaban (XARELTO) 10 MG TABS tablet Take 1 tablet (10 mg total) by mouth daily with breakfast for 19 days. Take one 10 mg xarelto once a day for three weeks following surgery to prevent blood clots.  . rizatriptan (MAXALT-MLT) 10 MG disintegrating tablet TAKE 1 TABLET EVERY DAY AS NEEDED FOR MIGRAINE, MAY REPEAT IN 2 HRS IF NEEDED. MAX 2 DOSES/WK (Patient taking differently: Take 10 mg by mouth every 2 (two) hours as needed for migraine. )  . sertraline (ZOLOFT) 100 MG tablet Take 100 mg by mouth at bedtime.   Marland Kitchen umeclidinium bromide (INCRUSE ELLIPTA) 62.5 MCG/INH AEPB Inhale 1 puff into the lungs daily.  . ziprasidone (GEODON) 40 MG capsule Take 40 mg by mouth 2 (two) times daily. Morning & with supper  . [DISCONTINUED] estradiol (ESTRACE) 0.1 MG/GM vaginal cream Place 1 Applicatorful vaginally once a week.  . [DISCONTINUED] levothyroxine (SYNTHROID, LEVOTHROID) 50 MCG tablet TAKE 1 TABLET EVERY DAY--- takes in am (Patient taking differently: Take 50 mcg by mouth daily before breakfast. )  . [DISCONTINUED] rosuvastatin (CRESTOR) 20 MG tablet TAKE 1 TABLET BY MOUTH EVERY DAY (Patient taking differently: Take 20 mg by mouth every morning. )   acetaminophen **OR** acetaminophen, albuterol, amitriptyline, ondansetron **OR** ondansetron (ZOFRAN) IV, oxyCODONE

## 2018-01-19 LAB — CBC
HCT: 31.9 % — ABNORMAL LOW (ref 36.0–46.0)
Hemoglobin: 9.9 g/dL — ABNORMAL LOW (ref 12.0–15.0)
MCH: 29.9 pg (ref 26.0–34.0)
MCHC: 31 g/dL (ref 30.0–36.0)
MCV: 96.4 fL (ref 80.0–100.0)
Platelets: 228 10*3/uL (ref 150–400)
RBC: 3.31 MIL/uL — ABNORMAL LOW (ref 3.87–5.11)
RDW: 13.7 % (ref 11.5–15.5)
WBC: 12 10*3/uL — ABNORMAL HIGH (ref 4.0–10.5)
nRBC: 0 % (ref 0.0–0.2)

## 2018-01-19 LAB — URINE CULTURE: Culture: 60000 — AB

## 2018-01-19 LAB — BASIC METABOLIC PANEL
ANION GAP: 9 (ref 5–15)
BUN: 8 mg/dL (ref 8–23)
CO2: 26 mmol/L (ref 22–32)
Calcium: 8.3 mg/dL — ABNORMAL LOW (ref 8.9–10.3)
Chloride: 104 mmol/L (ref 98–111)
Creatinine, Ser: 0.48 mg/dL (ref 0.44–1.00)
GFR calc non Af Amer: >60 mL/min (ref >60)
Glucose, Bld: 158 mg/dL — ABNORMAL HIGH (ref 70–99)
Potassium: 3.4 mmol/L — ABNORMAL LOW (ref 3.5–5.1)
Sodium: 139 mmol/L (ref 135–145)

## 2018-01-19 MED ORDER — POTASSIUM CHLORIDE CRYS ER 20 MEQ PO TBCR
40.0000 meq | EXTENDED_RELEASE_TABLET | Freq: Once | ORAL | Status: AC
  Administered 2018-01-19: 40 meq via ORAL
  Filled 2018-01-19: qty 2

## 2018-01-19 MED ORDER — CLONAZEPAM 0.5 MG PO TABS
0.5000 mg | ORAL_TABLET | Freq: Once | ORAL | Status: AC
  Administered 2018-01-19: 0.5 mg via ORAL
  Filled 2018-01-19: qty 1

## 2018-01-19 NOTE — Care Management Important Message (Signed)
Important Message  Patient Details  Name: NYJA WESTBROOK MRN: [id number] Date of Birth: 02-29-1955   Medicare Important Message Given:  Yes    Kerin Salen 01/19/2018, 12:30 PMImportant Message  Patient Details  Name: FAVOR KREH MRN: [id number] Date of Birth: May 09, 1955   Medicare Important Message Given:  Yes    Kerin Salen 01/19/2018, 12:30 PM

## 2018-01-19 NOTE — Progress Notes (Signed)
PROGRESS NOTE    [name]  SWH:[id number] DOB: July 11, 1955 DOA: 01/16/2018 PCP: [place], Betty G, MD      Brief Narrative:  [name] is a 62 y.o. F with HTN, OSA, hypothyroidism, and Bipolar disorder on Geodon and Zoloft who presents with progressive inability to walk.  This has been a slowly progressive problem, worse in the last 1-2 weeks (patient had left TKA in mid November).  In the ER, the patient appeared dehydrated and with dysuria to EDP, started on IV fluids; to admitting phyisician was notably rigid on exam, masked facies.    Assessment & Plan:  Dyskinesia, rigidity Patient followed in Movement disorders clinic since 2017 for tremor.  Those notes indicate symptomatic improvement with primidone, also highlight some mild parkinsonism more recently, attributed to [place].  The differential at this point probably includes parkinsonism from Geodon, Parkinson's disease, catatonia/psychosomatic bradykinesia, less likely serotonin syndrome, small vessel cerebrovascular disease.  UTI may be contributing, dehydration is not contributing, as she is no better after fluids overnight.  No fever to suggest NMS. -Consult Neurology, appreciate expert cares -Check CK, TSH  01/18/2018: -Neurology following.  Appreciate help. -She is complaining of tremors, muscle spasms. -Ordered Flexeril as needed. -Also ordered to check magnesium level.  Potassium replacement ordered. -CK/TSH normal. -PT/OT consult requested. 01/19/18: - Awaiting placement.   Bipolar disorder -Hold Geodon -If Neurology do not suspect Serotonin syndrome, will restart amitriptyline, sertraline 01/18/18: - Mood stable at this time - Continue current meds 01/19/18: - Stable at this time.  UTI Patient with dysuria, pyuria on UA. -Obtain urine culture -Start Ceftriaxone IV -Restart pentosan  01/18/18: -Urine cultures preliminary report showing E. Coli. 60,000col/cfu -Continue ceftriaxone.  Afebrile at  this time.   01/19/18: -Completed 3 days of IV ceftriaxone.  Denies any urinary symptoms at this time.  Recent knee replacement -PT evaluation 01/19/18: - Recommended rehab  COPD Patient currently unable to inhale properly for Incruse. -RT consult for symptomatic treatment. 01/19/18: - Stable at this time - Continue current meds  Hypothyroidism -Continue levothyroxine  Other medications -Continue primidone -Continue hydroxyzine -Continue Crestor -Hold gabapentin  Hypertension -Patient noted to have high blood pressure. -Started on low-dose metoprolol. -Continue to monitor blood pressure closely and adjust medications as needed. 01/19/18: -Blood pressure stable at this time.  Continue current medications.  Continue to monitor blood pressure closely and adjust medications as needed.   MDM and disposition: The below labs and imaging reports were reviewed and summarized above.  Medication management as above.  The patient was admitted with bradykinesia now unable to walk.  Neurology consulted. Suspicion of Parkinsonism from outside Neurologist.    Has tremors, new inability to walk that is acute, and for which the number of diagnostic considerations is extensive.  Neurology consulted and following. 01/19/18: -PT recommended rehab.  Awaiting placement.  Social worker on board.   DVT prophylaxis: Lovenox Code Status: FULL Family Communication: None present   Consultants:   Neurology  Procedures:   None  Antimicrobials:   Ceftriaxone 12/10 >>   Subjective: Tremors, muscle spasms improving.  No fever overnight, no seizures, no confusion, no halucinations.  No vomiting, diarrhea.  Objective: Vitals:   01/18/18 2026 01/18/18 2145 01/19/18 0616 01/19/18 1023  BP: (!) 145/79  126/70   Pulse: 91  87   Resp: 19  20   Temp: 99.4 F (37.4 C)  98.1 F (36.7 C)   TempSrc:      SpO2: 98% 98% 98% 98%  Weight:  Height:        Intake/Output Summary (Last  24 hours) at 01/19/2018 1115 Last data filed at 01/19/2018 0827 Gross per 24 hour  Intake 360 ml  Output 1700 ml  Net -1340 ml   Filed Weights   01/16/18 0115  Weight: 68.5 kg    Examination: General appearance:  adult female, awake, has tremors, no obvious distress.   HEENT: Anicteric, conjunctiva pink, lids and lashes normal. No nasal deformity, discharge, epistaxis.  Lips moist, OP tacky dry, no oral lesions, hearing normal.   Skin: Warm and dry.  No jaundice.  No suspicious rashes or lesions. Cardiac: Tachycardic, regular, nl S1-S2, no murmurs appreciated.  Capillary refill is brisk.  JVPnot visible.  No LE edema.  Radia  pulses 2+ and symmetric. Respiratory: Normal respiratory rate and rhythm.  CTAB without rales or wheezes. Abdomen: Abdomen soft.  no TTP. No ascites, distension, hepatosplenomegaly.   MSK: The left knee incision is clean dry and intact Neuro: Awake and alert.  EOMI, masked facies, bilateral brachial refelxes hyperreflexic, unable to elicit at knees, stiff in legs, less so in arms, but appears to be cogwheeling. Tremors improving. Speech improving.   Severe psychomotor slowing. Psych: Sensorium intact and responding to questions, attention normal. Affect at this time appears okay.  Judgment and insight appear normal.    Data Reviewed: I have personally reviewed following labs and imaging studies:  CBC: Recent Labs  Lab 01/16/18 0446 01/17/18 0617 01/18/18 0607 01/19/18 0611  WBC 12.8* 9.7 13.0* 12.0*  NEUTROABS 8.7*  --   --   --   HGB 10.8* 10.6* 11.1* 9.9*  HCT 34.1* 34.4* 35.2* 31.9*  MCV 94.7 95.0 93.1 96.4  PLT 338 282 318 712   Basic Metabolic Panel: Recent Labs  Lab 01/16/18 0446 01/17/18 0617 01/18/18 0607 01/19/18 0611  NA 139 140 140 139  K 3.3* 3.6 3.4* 3.4*  CL 102 103 102 104  CO2 27 30 28 26   GLUCOSE 143* 152* 151* 158*  BUN 10 6* 7* 8  CREATININE 0.82 0.69 0.61 0.48  CALCIUM 9.1 8.4* 8.6* 8.3*  MG  --   --  1.8  --     GFR: Estimated Creatinine Clearance: 63.8 mL/min (by C-Whitney formula based on SCr of 0.48 mg/dL). Liver Function Tests: Recent Labs  Lab 01/16/18 0446  AST 34  ALT 41  ALKPHOS 137*  BILITOT 0.8  PROT 6.5  ALBUMIN 3.5   No results for input(s): LIPASE, AMYLASE in the last 168 hours. No results for input(s): AMMONIA in the last 168 hours. Coagulation Profile: No results for input(s): INR, PROTIME in the last 168 hours. Cardiac Enzymes: Recent Labs  Lab 01/17/18 1700  CKTOTAL 137   BNP (last 3 results) No results for input(s): PROBNP in the last 8760 hours. HbA1C: No results for input(s): HGBA1C in the last 72 hours. CBG: No results for input(s): GLUCAP in the last 168 hours. Lipid Profile: No results for input(s): CHOL, HDL, LDLCALC, TRIG, CHOLHDL, LDLDIRECT in the last 72 hours. Thyroid Function Tests: Recent Labs    01/17/18 0758  TSH 0.603   Anemia Panel: No results for input(s): VITAMINB12, FOLATE, FERRITIN, TIBC, IRON, RETICCTPCT in the last 72 hours. Urine analysis:    Component Value Date/Time   COLORURINE YELLOW 01/16/2018 0804   APPEARANCEUR CLOUDY (A) 01/16/2018 0804   LABSPEC 1.010 01/16/2018 0804   PHURINE 6.0 01/16/2018 0804   GLUCOSEU NEGATIVE 01/16/2018 0804   GLUCOSEU NEGATIVE 11/21/2017 1026  HGBUR SMALL (A) 01/16/2018 0804   BILIRUBINUR NEGATIVE 01/16/2018 0804   KETONESUR NEGATIVE 01/16/2018 0804   PROTEINUR NEGATIVE 01/16/2018 0804   UROBILINOGEN 0.2 11/21/2017 1026   NITRITE NEGATIVE 01/16/2018 0804   LEUKOCYTESUR LARGE (A) 01/16/2018 0804   Sepsis Labs: @LABRCNTIP (procalcitonin:4,lacticacidven:4)  ) Recent Results (from the past 240 hour(s))  Culture, Urine     Status: Abnormal   Collection Time: 01/17/18  9:10 AM  Result Value Ref Range Status   Specimen Description   Final    URINE, CLEAN CATCH Performed at [facility], [place] [address], [place], [place] [address]    Special Requests   Final     NONE Performed at [facility], [place] [address] [place], [place] [address]    Culture 60,000 COLONIES/mL ESCHERICHIA COLI (A)  Final   Report Status 01/19/2018 FINAL  Final   Organism ID, Bacteria ESCHERICHIA COLI (A)  Final      Susceptibility   Escherichia coli - MIC*    AMPICILLIN 4 SENSITIVE Sensitive     CEFAZOLIN <=4 SENSITIVE Sensitive     CEFTRIAXONE <=1 SENSITIVE Sensitive     CIPROFLOXACIN <=0.25 SENSITIVE Sensitive     GENTAMICIN <=1 SENSITIVE Sensitive     IMIPENEM <=0.25 SENSITIVE Sensitive     NITROFURANTOIN <=16 SENSITIVE Sensitive     TRIMETH/SULFA <=20 SENSITIVE Sensitive     AMPICILLIN/SULBACTAM <=2 SENSITIVE Sensitive     PIP/TAZO <=4 SENSITIVE Sensitive     Extended ESBL NEGATIVE Sensitive     * 60,000 COLONIES/mL ESCHERICHIA COLI         Radiology Studies: No results found.      Scheduled Meds: . budesonide (PULMICORT) nebulizer solution  1 mg Nebulization BID  . enoxaparin (LOVENOX) injection  40 mg Subcutaneous Q24H  . hydrOXYzine  25 mg Oral QPC breakfast  . levothyroxine  50 mcg Oral Q0600  . metoprolol tartrate  12.5 mg Oral BID  . montelukast  10 mg Oral QHS  . multivitamin  1 tablet Oral Daily  . pentosan polysulfate  100 mg Oral BID  . primidone  50 mg Oral BID  . rosuvastatin  20 mg Oral q morning - 10a  . sertraline  100 mg Oral QHS  . thiamine  100 mg Oral Daily   Continuous Infusions: . dextrose 5 % and 0.9% NaCl 75 mL/hr at 01/18/18 0300     LOS: 3 days    Time spent: 25 minutes    Yaakov Guthrie, MD [facility] 01/19/2018, 11:15 AM     Please page through [place]:  [email or web address] Password [email or web address] If 7PM-7AM, please contact night-coverage    Estimated body mass index is 29.49 kg/m as calculated from the following:   Height as of this encounter: 5' (1.524 m).   Weight as of this encounter: 68.5 kg. Malnutrition Type:   Malnutrition Characteristics:   Nutrition Interventions:      .  budesonide (PULMICORT) nebulizer solution  1 mg Nebulization BID  . enoxaparin (LOVENOX) injection  40 mg Subcutaneous Q24H  . hydrOXYzine  25 mg Oral QPC breakfast  . levothyroxine  50 mcg Oral Q0600  . metoprolol tartrate  12.5 mg Oral BID  . montelukast  10 mg Oral QHS  . multivitamin  1 tablet Oral Daily  . pentosan polysulfate  100 mg Oral BID  . primidone  50 mg Oral BID  . rosuvastatin  20 mg Oral q morning - 10a  . sertraline  100 mg Oral QHS  .  thiamine  100 mg Oral Daily   . dextrose 5 % and 0.9% NaCl 75 mL/hr at 01/18/18 0300    Current Meds  Medication Sig  . albuterol (PROVENTIL HFA;VENTOLIN HFA) 108 (90 Base) MCG/ACT inhaler Inhale 1-2 puffs into the lungs daily as needed for wheezing or shortness of breath.  Marland Kitchen albuterol (PROVENTIL) (2.5 MG/3ML) 0.083% nebulizer solution TAKE 3 MLS BY NEBULIZATION EVERY 6 HOURS AS NEEDED FOR WHEEZING OR SHORTNESS OF BREATH. (Patient taking differently: Take 2.5 mg by nebulization every 6 (six) hours as needed for wheezing or shortness of breath. )  . alendronate (FOSAMAX) 70 MG tablet TAKE 1 TABLET BY MOUTH EVERY 7 DAYS WITH FULL GLASS OF WATER ON EMPTY STOMACH (Patient taking differently: Take 70 mg by mouth once a week. )  . amitriptyline (ELAVIL) 10 MG tablet Take 10 mg by mouth at bedtime as needed for sleep.   Marland Kitchen anti-nausea (EMETROL) solution Take 30 mLs by mouth every 15 (fifteen) minutes as needed for nausea or vomiting.  . Azelastine HCl 0.15 % SOLN PLACE 2 SPRAYS INTO THE NOSE DAILY AS NEEDED. (Patient taking differently: Place 2 sprays into the nose daily as needed (congestion). )  . Carbinoxamine Maleate 4 MG TABS TAKE 1 TABLET BY MOUTH EVERY 8 HOURS AS NEEDED (Patient taking differently: Take 4 mg by mouth every 8 (eight) hours as needed (allergies). )  . ELMIRON 100 MG capsule Take 100 mg by mouth 2 (two) times daily.   . fluticasone (FLOVENT HFA) 220 MCG/ACT inhaler TAKE 2 PUFFS BY MOUTH TWICE A DAY (Patient taking differently:  Inhale 2 puffs into the lungs 2 (two) times daily. )  . gabapentin (NEURONTIN) 300 MG capsule Take 300 mg by mouth 3 (three) times daily.   . hydrocortisone cream 1 % Apply 1 application topically 2 (two) times daily as needed for itching.   . hydrOXYzine (ATARAX/VISTARIL) 25 MG tablet Take 25 mg by mouth daily after breakfast.   . loperamide (IMODIUM A-D) 2 MG tablet Take 2-4 mg by mouth 4 (four) times daily as needed for diarrhea or loose stools.   . methocarbamol (ROBAXIN) 500 MG tablet Take 1 tablet (500 mg total) by mouth every 6 (six) hours as needed for muscle spasms.  . mometasone (NASONEX) 50 MCG/ACT nasal spray Place 2 sprays into the nose every morning.   . montelukast (SINGULAIR) 10 MG tablet Take 1 tablet (10 mg total) by mouth at bedtime. (Patient taking differently: Take 10 mg by mouth at bedtime. )  . olopatadine (PATANOL) 0.1 % ophthalmic solution Place 1 drop into both eyes 2 (two) times daily as needed for allergies.  Marland Kitchen oxyCODONE (OXY IR/ROXICODONE) 5 MG immediate release tablet Take 1-2 tablets (5-10 mg total) by mouth every 6 (six) hours as needed for moderate pain (pain score 4-6).  Marland Kitchen primidone (MYSOLINE) 50 MG tablet Take 1 tablet (50 mg total) by mouth 2 (two) times daily.  . rivaroxaban (XARELTO) 10 MG TABS tablet Take 1 tablet (10 mg total) by mouth daily with breakfast for 19 days. Take one 10 mg xarelto once a day for three weeks following surgery to prevent blood clots.  . rizatriptan (MAXALT-MLT) 10 MG disintegrating tablet TAKE 1 TABLET EVERY DAY AS NEEDED FOR MIGRAINE, MAY REPEAT IN 2 HRS IF NEEDED. MAX 2 DOSES/WK (Patient taking differently: Take 10 mg by mouth every 2 (two) hours as needed for migraine. )  . sertraline (ZOLOFT) 100 MG tablet Take 100 mg by mouth at bedtime.   Marland Kitchen  umeclidinium bromide (INCRUSE ELLIPTA) 62.5 MCG/INH AEPB Inhale 1 puff into the lungs daily.  . ziprasidone (GEODON) 40 MG capsule Take 40 mg by mouth 2 (two) times daily. Morning & with supper   . [DISCONTINUED] estradiol (ESTRACE) 0.1 MG/GM vaginal cream Place 1 Applicatorful vaginally once a week.  . [DISCONTINUED] levothyroxine (SYNTHROID, LEVOTHROID) 50 MCG tablet TAKE 1 TABLET EVERY DAY--- takes in am (Patient taking differently: Take 50 mcg by mouth daily before breakfast. )  . [DISCONTINUED] rosuvastatin (CRESTOR) 20 MG tablet TAKE 1 TABLET BY MOUTH EVERY DAY (Patient taking differently: Take 20 mg by mouth every morning. )   acetaminophen **OR** acetaminophen, albuterol, amitriptyline, cyclobenzaprine, ondansetron **OR** ondansetron (ZOFRAN) IV, oxyCODONE

## 2018-01-19 NOTE — Evaluation (Signed)
Occupational Therapy Evaluation Patient Details Name: [name] MRN: [id number] DOB: March 05, 1955 Today's Date: 01/19/2018    History of Present Illness 62 yo female admitted to ED on 12/9 for increasing total body weakness, inability to ambulate significantly over the past week. Pt with L TKR on 12/26/17. Per hospitalists' note, pt with possible serotonin syndrome, parkinsonism. Pt with PMH of bipolar disorder, pt currently off serotonin medications. Other PMH includes anxiety, claustrophobia, depression, UTI, TMJ dysfunction, migraines, OA, OSA on CPAP, PVCs, osteoporosis, obesity, hiatial hernia repair 2018, tremors, IBS.    Clinical Impression   Pt admitted with increasing weakness and inability to perform ADL activity . Pt currently with functional limitations due to the deficits listed below (see OT Problem List).  Pt will benefit from skilled OT to increase their safety and independence with ADL and functional mobility for ADL to facilitate discharge to venue listed below.      Follow Up Recommendations  SNF    Equipment Recommendations  None recommended by OT       Precautions / Restrictions Precautions Precautions: Fall Restrictions Weight Bearing Restrictions: No      Mobility Bed Mobility Overal bed mobility: Needs Assistance Bed Mobility: Supine to Sit;Sit to Supine     Supine to sit: Max assist;HOB elevated Sit to supine: Max assist;HOB elevated   General bed mobility comments: increased time and encouragement  Transfers Overall transfer level: (NT - pt deferring sit to stand today, also pt with LE weakness not conducive to standing (per screen and pt's lack of ability to perform hip and knee flexion/extension)                    Balance Overall balance assessment: Needs assistance Sitting-balance support: Bilateral upper extremity supported Sitting balance-Leahy Scale: Fair   Postural control: (with LE movement) Standing balance support: (NT  )                               ADL either performed or assessed with clinical judgement   ADL Overall ADL's : Needs assistance/impaired Eating/Feeding: Minimal assistance;Sitting   Grooming: Minimal assistance;Sitting Grooming Details (indicate cue type and reason): increased time                               General ADL Comments: Pt sat EOB with OT for grooming task.  Pt needed increased time.  Pt declined getting to chair. Pt wanted to return to supine. Challenging to get pt comfortable.  Rn aware     Vision Patient Visual Report: No change from baseline              Pertinent Vitals/Pain Pain Score: 2  Pain Location: R knee with bed moblity Pain Descriptors / Indicators: Grimacing;Guarding Pain Intervention(s): Limited activity within patient's tolerance;Repositioned     Hand Dominance Left   Extremity/Trunk Assessment Upper Extremity Assessment Upper Extremity Assessment: Generalized weakness           Communication Communication Communication: No difficulties   Cognition Arousal/Alertness: Awake/alert Behavior During Therapy: Flat affect Overall Cognitive Status: Within Functional Limits for tasks assessed                                 General Comments: Pt with mask-like expression during session, very flat affect with no indication of emotional  inflection in voice.    General Comments               Home Living Family/patient expects to be discharged to:: Skilled nursing facility(Pt reports she cannot Whitney/c to her friend's home, which she did after her L TKR in November. Pt wants to Whitney/c to rehab)                             Home Equipment: Kasandra Knudsen - single point;Walker - 2 wheels;Bedside commode          Prior Functioning/Environment Level of Independence: Needs assistance  Gait / Transfers Assistance Needed: Pt reports increasing inability to ambulate at home. Pt reports over the last week having  to call for friend to bring a chair to her on the way to the bathroom because she could not stand anymore. Pt reports 3 near falls since TKR.  ADL's / Homemaking Assistance Needed: Pt reports not being able to hold utensils to eat over the past week, difficulty getting in and out of bed.  Communication / Swallowing Assistance Needed: Per chart review, pt with delayed swallowing.           OT Problem List: Decreased strength;Decreased activity tolerance;Decreased knowledge of use of DME or AE      OT Treatment/Interventions: Self-care/ADL training;Patient/family education;Therapeutic activities    OT Goals(Current goals can be found in the care plan section) Acute Rehab OT Goals Patient Stated Goal: get stronger  OT Goal Formulation: With patient  OT Frequency:     Barriers to Whitney/C: Decreased caregiver support             AM-PAC OT "6 Clicks" Daily Activity     Outcome Measure Help from another person eating meals?: A Little Help from another person taking care of personal grooming?: A Little Help from another person toileting, which includes using toliet, bedpan, or urinal?: Total Help from another person bathing (including washing, rinsing, drying)?: A Little Help from another person to put on and taking off regular upper body clothing?: A Lot Help from another person to put on and taking off regular lower body clothing?: Total 6 Click Score: 13   End of Session Nurse Communication: Mobility status  Activity Tolerance: Patient limited by fatigue Patient left: with family/visitor present;in bed;with bed alarm set  OT Visit Diagnosis: Unsteadiness on feet (R26.81);Other abnormalities of gait and mobility (R26.89);History of falling (Z91.81);Muscle weakness (generalized) (M62.81)                Time: 3382-5053 OT Time Calculation (min): 21 min Charges:  OT General Charges $OT Visit: 1 Visit OT Evaluation $OT Eval Moderate Complexity: 1 Mod  Kari Baars, [place] [phone] Office- [phone]     [place], [name] 01/19/2018, 11:50 AM

## 2018-01-20 LAB — BASIC METABOLIC PANEL
Anion gap: 8 (ref 5–15)
BUN: 9 mg/dL (ref 8–23)
CO2: 28 mmol/L (ref 22–32)
Calcium: 8.5 mg/dL — ABNORMAL LOW (ref 8.9–10.3)
Chloride: 103 mmol/L (ref 98–111)
Creatinine, Ser: 0.62 mg/dL (ref 0.44–1.00)
GFR calc Af Amer: >60 mL/min (ref >60)
GFR calc non Af Amer: >60 mL/min (ref >60)
Glucose, Bld: 146 mg/dL — ABNORMAL HIGH (ref 70–99)
Potassium: 3.8 mmol/L (ref 3.5–5.1)
Sodium: 139 mmol/L (ref 135–145)

## 2018-01-20 LAB — CBC
HCT: 31.2 % — ABNORMAL LOW (ref 36.0–46.0)
HEMOGLOBIN: 9.7 g/dL — AB (ref 12.0–15.0)
MCH: 29.8 pg (ref 26.0–34.0)
MCHC: 31.1 g/dL (ref 30.0–36.0)
MCV: 95.7 fL (ref 80.0–100.0)
Platelets: 233 10*3/uL (ref 150–400)
RBC: 3.26 MIL/uL — ABNORMAL LOW (ref 3.87–5.11)
RDW: 13.7 % (ref 11.5–15.5)
WBC: 10.9 10*3/uL — ABNORMAL HIGH (ref 4.0–10.5)
nRBC: 0 % (ref 0.0–0.2)

## 2018-01-20 NOTE — Progress Notes (Signed)
PROGRESS NOTE    Diane SANDA  Whitney:[id number] DOB: December 31, 1955 DOA: 01/16/2018 PCP: [place], Betty G, MD      Brief Narrative:  [name] is a 62 y.o. F with HTN, OSA, hypothyroidism, and Bipolar disorder on Geodon and Zoloft who presents with progressive inability to walk.  This has been a slowly progressive problem, worse in the last 1-2 weeks (patient had left TKA in mid November).  In the ER, the patient appeared dehydrated and with dysuria to EDP, started on IV fluids; to admitting phyisician was notably rigid on exam, masked facies.    Assessment & Plan:  Dyskinesia, rigidity Patient followed in Movement disorders clinic since 2017 for tremor.  Those notes indicate symptomatic improvement with primidone, also highlight some mild parkinsonism more recently, attributed to [place].  The differential at this point probably includes parkinsonism from Geodon, Parkinson's disease, catatonia/psychosomatic bradykinesia, less likely serotonin syndrome, small vessel cerebrovascular disease.  UTI may be contributing, dehydration is not contributing, as she is no better after fluids overnight.  No fever to suggest NMS. -Consult Neurology, appreciate expert cares -Check CK, TSH  01/18/2018: -Neurology following.  Appreciate help. -She is complaining of tremors, muscle spasms. - Ordered Flexeril as needed. -Also ordered to check magnesium level.  Potassium replacement ordered. -CK/TSH normal. -PT/OT consult requested. 01/19/18: -Still having some tremors but improving.  Continue current medications. 01/20/18: -Patient says that she feels that she is getting "weaker" and wanting to get to rehab. -Per social work facility needs updated PT note.  Reconsulted PT. -Per neurology here patient will need to follow-up with Dr. Carles Collet her outpatient neurologist.  Bipolar disorder -Hold Geodon -If Neurology do not suspect Serotonin syndrome, will restart amitriptyline, sertraline 01/18/18: -  Mood stable at this time - Continue current meds. 01/19/18: -Restarted amitriptyline as needed, on primidone 01/20/18: -Mood appears to be stable at this time.  Holding Geodon. -She was advised to follow-up with her outpatient psychiatrist to discuss other medications.  UTI Patient with dysuria, pyuria on UA. -Obtain urine culture -Start Ceftriaxone IV -Restart pentosan  01/18/18: -Urine cultures preliminary report showing E. Coli. -Continue ceftriaxone.  Afebrile at this time.  Follow-up on the final urine cultures. 01/19/18: - Completed Ceftriaxone. -Urine culture showing only 60,000 colonies per mL of E. Coli. -Denies having any urinary symptoms at this time.  Afebrile, no leukocytosis.  Recent knee replacement -PT evaluation  COPD Patient currently unable to inhale properly for Incruse. -RT consulted for symptomatic treatment.  Hypothyroidism -Continue levothyroxine  Other medications -Continue primidone -Continue hydroxyzine -Continue Crestor -Hold gabapentin  Hypertension -Patient noted to have high blood pressure. -Started on low-dose metoprolol. 01/20/18: -Blood pressure still high.  Will increase the dose of metoprolol.  Continue to monitor blood pressure closely and adjust medications as needed.   MDM and disposition: The below labs and imaging reports were reviewed and summarized above.  Medication management as above.  The patient was admitted with bradykinesia now unable to walk.  Neurology consulted. Suspicion of Parkinsonism from outside Neurologist.    The patient is persistently tachycardic >100 bpm, continue IV fluids.  She further has a new inability to walk that is acute, and for which the number of diagnostic considerations is extensive.  Neurology consulted.  Patient at this time awaiting placement to rehab.   DVT prophylaxis: Lovenox Code Status: FULL Family Communication: None present   Consultants:   Neurology  Procedures:    None  Antimicrobials:   Ceftriaxone 12/10 >>01/19/18   Subjective: C/o  tremors.  No fever overnight, no seizures, no confusion, no halucinations.  No vomiting, diarrhea.  Objective: Vitals:   01/19/18 2125 01/19/18 2126 01/20/18 0637 01/20/18 0930  BP:  (!) 143/99 (!) 148/95   Pulse:  (!) 104 96   Resp:  16 20   Temp:  98 F (36.7 C) 98.7 F (37.1 C)   TempSrc:  Oral Oral   SpO2: 98% 98% 96% 97%  Weight:      Height:        Intake/Output Summary (Last 24 hours) at 01/20/2018 1235 Last data filed at 01/20/2018 0945 Gross per 24 hour  Intake 480 ml  Output 550 ml  Net -70 ml   Filed Weights   01/16/18 0115  Weight: 68.5 kg    Examination: General appearance:  adult female, awake, has tremors, no obvious distress.   HEENT: Anicteric, conjunctiva pink, lids and lashes normal. No nasal deformity, discharge, epistaxis.  Lips moist, OP tacky dry, no oral lesions, hearing normal.   Skin: Warm and dry.  No jaundice.  No suspicious rashes or lesions. Cardiac: Tachycardic, regular, nl S1-S2, no murmurs appreciated.  Capillary refill is brisk.  JVPnot visible.  No LE edema.  Radia  pulses 2+ and symmetric. Respiratory: Normal respiratory rate and rhythm.  CTAB without rales or wheezes. Abdomen: Abdomen soft.  no TTP. No ascites, distension, hepatosplenomegaly.   MSK: The left knee incision is clean dry and intact Neuro: Awake and alert.  EOMI, masked facies, bilateral brachial refelxes hyperreflexic, unable to elicit at knees, stiff in legs, less so in arms, but appears to be cogwheeling. Has tremors. Speech improving.   Severe psychomotor slowing. Psych: Sensorium intact and responding to questions, attention normal. Affect at this time appears okay.  Judgment and insight appear normal.    Data Reviewed: I have personally reviewed following labs and imaging studies:  CBC: Recent Labs  Lab 01/16/18 0446 01/17/18 0617 01/18/18 0607 01/19/18 0611 01/20/18 0616  WBC  12.8* 9.7 13.0* 12.0* 10.9*  NEUTROABS 8.7*  --   --   --   --   HGB 10.8* 10.6* 11.1* 9.9* 9.7*  HCT 34.1* 34.4* 35.2* 31.9* 31.2*  MCV 94.7 95.0 93.1 96.4 95.7  PLT 338 282 318 228 683   Basic Metabolic Panel: Recent Labs  Lab 01/16/18 0446 01/17/18 0617 01/18/18 0607 01/19/18 0611 01/20/18 0616  NA 139 140 140 139 139  K 3.3* 3.6 3.4* 3.4* 3.8  CL 102 103 102 104 103  CO2 27 30 28 26 28   GLUCOSE 143* 152* 151* 158* 146*  BUN 10 6* 7* 8 9  CREATININE 0.82 0.69 0.61 0.48 0.62  CALCIUM 9.1 8.4* 8.6* 8.3* 8.5*  MG  --   --  1.8  --   --    GFR: Estimated Creatinine Clearance: 63.8 mL/min (by C-G formula based on SCr of 0.62 mg/dL). Liver Function Tests: Recent Labs  Lab 01/16/18 0446  AST 34  ALT 41  ALKPHOS 137*  BILITOT 0.8  PROT 6.5  ALBUMIN 3.5   No results for input(s): LIPASE, AMYLASE in the last 168 hours. No results for input(s): AMMONIA in the last 168 hours. Coagulation Profile: No results for input(s): INR, PROTIME in the last 168 hours. Cardiac Enzymes: Recent Labs  Lab 01/17/18 1700  CKTOTAL 137   BNP (last 3 results) No results for input(s): PROBNP in the last 8760 hours. HbA1C: No results for input(s): HGBA1C in the last 72 hours. CBG: No results for input(s):  GLUCAP in the last 168 hours. Lipid Profile: No results for input(s): CHOL, HDL, LDLCALC, TRIG, CHOLHDL, LDLDIRECT in the last 72 hours. Thyroid Function Tests: No results for input(s): TSH, T4TOTAL, FREET4, T3FREE, THYROIDAB in the last 72 hours. Anemia Panel: No results for input(s): VITAMINB12, FOLATE, FERRITIN, TIBC, IRON, RETICCTPCT in the last 72 hours. Urine analysis:    Component Value Date/Time   COLORURINE YELLOW 01/16/2018 0804   APPEARANCEUR CLOUDY (A) 01/16/2018 0804   LABSPEC 1.010 01/16/2018 0804   PHURINE 6.0 01/16/2018 0804   GLUCOSEU NEGATIVE 01/16/2018 0804   GLUCOSEU NEGATIVE 11/21/2017 1026   HGBUR SMALL (A) 01/16/2018 0804   BILIRUBINUR NEGATIVE 01/16/2018  0804   KETONESUR NEGATIVE 01/16/2018 0804   PROTEINUR NEGATIVE 01/16/2018 0804   UROBILINOGEN 0.2 11/21/2017 1026   NITRITE NEGATIVE 01/16/2018 0804   LEUKOCYTESUR LARGE (A) 01/16/2018 0804   Sepsis Labs: @LABRCNTIP (procalcitonin:4,lacticacidven:4)  ) Recent Results (from the past 240 hour(s))  Culture, Urine     Status: Abnormal   Collection Time: 01/17/18  9:10 AM  Result Value Ref Range Status   Specimen Description   Final    URINE, CLEAN CATCH Performed at [facility], [place] [address], [place], [place] [address]    Special Requests   Final    NONE Performed at [facility], [place] [address], [place], [place] [address]    Culture 60,000 COLONIES/mL ESCHERICHIA COLI (A)  Final   Report Status 01/19/2018 FINAL  Final   Organism ID, Bacteria ESCHERICHIA COLI (A)  Final      Susceptibility   Escherichia coli - MIC*    AMPICILLIN 4 SENSITIVE Sensitive     CEFAZOLIN <=4 SENSITIVE Sensitive     CEFTRIAXONE <=1 SENSITIVE Sensitive     CIPROFLOXACIN <=0.25 SENSITIVE Sensitive     GENTAMICIN <=1 SENSITIVE Sensitive     IMIPENEM <=0.25 SENSITIVE Sensitive     NITROFURANTOIN <=16 SENSITIVE Sensitive     TRIMETH/SULFA <=20 SENSITIVE Sensitive     AMPICILLIN/SULBACTAM <=2 SENSITIVE Sensitive     PIP/TAZO <=4 SENSITIVE Sensitive     Extended ESBL NEGATIVE Sensitive     * 60,000 COLONIES/mL ESCHERICHIA COLI         Radiology Studies: No results found.      Scheduled Meds: . budesonide (PULMICORT) nebulizer solution  1 mg Nebulization BID  . enoxaparin (LOVENOX) injection  40 mg Subcutaneous Q24H  . hydrOXYzine  25 mg Oral QPC breakfast  . levothyroxine  50 mcg Oral Q0600  . metoprolol tartrate  12.5 mg Oral BID  . montelukast  10 mg Oral QHS  . multivitamin  1 tablet Oral Daily  . pentosan polysulfate  100 mg Oral BID  . primidone  50 mg Oral BID  . rosuvastatin  20 mg Oral q morning - 10a  . sertraline  100 mg Oral QHS  .  thiamine  100 mg Oral Daily   Continuous Infusions: . dextrose 5 % and 0.9% NaCl 75 mL/hr at 01/18/18 0300     LOS: 4 days    Time spent: 25 minute    Yaakov Guthrie, MD [facility] 01/20/2018, 12:35 PM     Please page through [place]:  [email or web address] Password [email or web address] If 7PM-7AM, please contact night-coverage    Estimated body mass index is 29.49 kg/m as calculated from the following:   Height as of this encounter: 5' (1.524 m).   Weight as of this encounter: 68.5 kg. Malnutrition Type:   Malnutrition Characteristics:   Nutrition  Interventions:      . budesonide (PULMICORT) nebulizer solution  1 mg Nebulization BID  . enoxaparin (LOVENOX) injection  40 mg Subcutaneous Q24H  . hydrOXYzine  25 mg Oral QPC breakfast  . levothyroxine  50 mcg Oral Q0600  . metoprolol tartrate  12.5 mg Oral BID  . montelukast  10 mg Oral QHS  . multivitamin  1 tablet Oral Daily  . pentosan polysulfate  100 mg Oral BID  . primidone  50 mg Oral BID  . rosuvastatin  20 mg Oral q morning - 10a  . sertraline  100 mg Oral QHS  . thiamine  100 mg Oral Daily   . dextrose 5 % and 0.9% NaCl 75 mL/hr at 01/18/18 0300    Current Meds  Medication Sig  . albuterol (PROVENTIL HFA;VENTOLIN HFA) 108 (90 Base) MCG/ACT inhaler Inhale 1-2 puffs into the lungs daily as needed for wheezing or shortness of breath.  Marland Kitchen albuterol (PROVENTIL) (2.5 MG/3ML) 0.083% nebulizer solution TAKE 3 MLS BY NEBULIZATION EVERY 6 HOURS AS NEEDED FOR WHEEZING OR SHORTNESS OF BREATH. (Patient taking differently: Take 2.5 mg by nebulization every 6 (six) hours as needed for wheezing or shortness of breath. )  . alendronate (FOSAMAX) 70 MG tablet TAKE 1 TABLET BY MOUTH EVERY 7 DAYS WITH FULL GLASS OF WATER ON EMPTY STOMACH (Patient taking differently: Take 70 mg by mouth once a week. )  . amitriptyline (ELAVIL) 10 MG tablet Take 10 mg by mouth at bedtime as needed for sleep.   Marland Kitchen anti-nausea (EMETROL) solution Take 30 mLs by  mouth every 15 (fifteen) minutes as needed for nausea or vomiting.  . Azelastine HCl 0.15 % SOLN PLACE 2 SPRAYS INTO THE NOSE DAILY AS NEEDED. (Patient taking differently: Place 2 sprays into the nose daily as needed (congestion). )  . Carbinoxamine Maleate 4 MG TABS TAKE 1 TABLET BY MOUTH EVERY 8 HOURS AS NEEDED (Patient taking differently: Take 4 mg by mouth every 8 (eight) hours as needed (allergies). )  . ELMIRON 100 MG capsule Take 100 mg by mouth 2 (two) times daily.   . fluticasone (FLOVENT HFA) 220 MCG/ACT inhaler TAKE 2 PUFFS BY MOUTH TWICE A DAY (Patient taking differently: Inhale 2 puffs into the lungs 2 (two) times daily. )  . gabapentin (NEURONTIN) 300 MG capsule Take 300 mg by mouth 3 (three) times daily.   . hydrocortisone cream 1 % Apply 1 application topically 2 (two) times daily as needed for itching.   . hydrOXYzine (ATARAX/VISTARIL) 25 MG tablet Take 25 mg by mouth daily after breakfast.   . loperamide (IMODIUM A-D) 2 MG tablet Take 2-4 mg by mouth 4 (four) times daily as needed for diarrhea or loose stools.   . methocarbamol (ROBAXIN) 500 MG tablet Take 1 tablet (500 mg total) by mouth every 6 (six) hours as needed for muscle spasms.  . mometasone (NASONEX) 50 MCG/ACT nasal spray Place 2 sprays into the nose every morning.   . montelukast (SINGULAIR) 10 MG tablet Take 1 tablet (10 mg total) by mouth at bedtime. (Patient taking differently: Take 10 mg by mouth at bedtime. )  . olopatadine (PATANOL) 0.1 % ophthalmic solution Place 1 drop into both eyes 2 (two) times daily as needed for allergies.  Marland Kitchen oxyCODONE (OXY IR/ROXICODONE) 5 MG immediate release tablet Take 1-2 tablets (5-10 mg total) by mouth every 6 (six) hours as needed for moderate pain (pain score 4-6).  Marland Kitchen primidone (MYSOLINE) 50 MG tablet Take 1 tablet (50 mg  total) by mouth 2 (two) times daily.  . rivaroxaban (XARELTO) 10 MG TABS tablet Take 1 tablet (10 mg total) by mouth daily with breakfast for 19 days. Take one 10  mg xarelto once a day for three weeks following surgery to prevent blood clots.  . rizatriptan (MAXALT-MLT) 10 MG disintegrating tablet TAKE 1 TABLET EVERY DAY AS NEEDED FOR MIGRAINE, MAY REPEAT IN 2 HRS IF NEEDED. MAX 2 DOSES/WK (Patient taking differently: Take 10 mg by mouth every 2 (two) hours as needed for migraine. )  . sertraline (ZOLOFT) 100 MG tablet Take 100 mg by mouth at bedtime.   Marland Kitchen umeclidinium bromide (INCRUSE ELLIPTA) 62.5 MCG/INH AEPB Inhale 1 puff into the lungs daily.  . ziprasidone (GEODON) 40 MG capsule Take 40 mg by mouth 2 (two) times daily. Morning & with supper  . [DISCONTINUED] estradiol (ESTRACE) 0.1 MG/GM vaginal cream Place 1 Applicatorful vaginally once a week.  . [DISCONTINUED] levothyroxine (SYNTHROID, LEVOTHROID) 50 MCG tablet TAKE 1 TABLET EVERY DAY--- takes in am (Patient taking differently: Take 50 mcg by mouth daily before breakfast. )  . [DISCONTINUED] rosuvastatin (CRESTOR) 20 MG tablet TAKE 1 TABLET BY MOUTH EVERY DAY (Patient taking differently: Take 20 mg by mouth every morning. )   acetaminophen **OR** acetaminophen, albuterol, amitriptyline, cyclobenzaprine, ondansetron **OR** ondansetron (ZOFRAN) IV, oxyCODONE

## 2018-01-20 NOTE — Progress Notes (Signed)
LCSW spoke with facility. Facility needs updated PT note for insurance approval.   LCSW paged PT to notify. LCSW notified attending by text.   LCSW will send requested info to facility once available.   Carolin Coy [place]er [place]

## 2018-01-20 NOTE — Progress Notes (Signed)
Physical Therapy Treatment Patient Details Name: [name] MRN: [id number] DOB: 09/08/1955 Today's Date: 01/20/2018    History of Present Illness 62 yo female admitted to ED on 12/9 for increasing total body weakness, inability to ambulate significantly over the past week. Pt with L TKR on 12/26/17. Per hospitalists' note, pt with possible serotonin syndrome, parkinsonism. Pt with PMH of bipolar disorder, pt currently off serotonin medications. Other PMH includes anxiety, claustrophobia, depression, UTI, TMJ dysfunction, migraines, OA, OSA on CPAP, PVCs, osteoporosis, obesity, hiatial hernia repair 2018, tremors, IBS.     PT Comments    Pt with increased tone in PF/DF, postural rigidity in trunk, tremors more widespread this session. Pt with increased initiation of LE movement during LE exercises and bed mobility this session. Sit to stands attempted x2, but pt unsuccessful in sit to stand attempts and required total assist x2. PT to continue to follow acutely, will progress mobility as tolerated.     Follow Up Recommendations  SNF;Supervision for mobility/OOB     Equipment Recommendations  None recommended by PT    Recommendations for Other Services       Precautions / Restrictions Precautions Precautions: Fall Restrictions Weight Bearing Restrictions: No Other Position/Activity Restrictions: WBAT    Mobility  Bed Mobility Overal bed mobility: Needs Assistance Bed Mobility: Supine to Sit;Sit to Supine;Rolling Rolling: Mod assist   Supine to sit: Max assist;+2 for physical assistance;HOB elevated Sit to supine: Max assist;+2 for physical assistance;HOB elevated   General bed mobility comments: Mod assist for rolling bilaterally to straighten bed pads after session. Max assist +2 for supine<>sit for LE management, scooting to EOB, trunk management. Pt with increased initiation of hip abduction/adduction and lifting when moving to EOB today.   Transfers Overall transfer  level: Needs assistance Equipment used: Rolling walker (2 wheeled)   Sit to Stand: Total assist;+2 physical assistance;From elevated surface         General transfer comment: Total assist +2 for power up, blocking feet to avoid pt sliding forward, hip extension, and steadying. Pt unable to come to full standing, lacking hip extension. Sit to stand attempt x2, both unsuccessful at full upright positioning.   Ambulation/Gait Ambulation/Gait assistance: (NT - pt unsafe to ambulate at this time, requires total assist +2 for standing)               Stairs             Wheelchair Mobility    Modified Rankin (Stroke Patients Only)       Balance Overall balance assessment: Needs assistance Sitting-balance support: No upper extremity supported Sitting balance-Leahy Scale: Fair Sitting balance - Comments: able to sit without support, postural rigidity in sitting and when attempting sit to stand    Standing balance support: Bilateral upper extremity supported Standing balance-Leahy Scale: Zero Standing balance comment: total assist +2 to attempt standing                             Cognition Arousal/Alertness: Awake/alert Behavior During Therapy: Flat affect Overall Cognitive Status: Within Functional Limits for tasks assessed                                 General Comments: Pt with continued mask-like expression during session, very flat affect with no indication of emotional inflection in voice. Pt with short responses during session.  Exercises Total Joint Exercises Ankle Circles/Pumps: AAROM;Both;10 reps;Supine Quad Sets: AROM;Both;10 reps;Supine Heel Slides: AAROM;10 reps;Supine;Both Long Arc Quad: AAROM;Both;10 reps;Seated Goniometric ROM: knee aarom ~10-80*, limited by stiffness and pain  Marching in Standing: AAROM;Both;10 reps;Seated    General Comments General comments (skin integrity, edema, etc.): Increased tone noted in  bilateral PF/DF when attempting to perform AAROM PF/DF. Pt with R knee pain this session, as pt states R knee is as bad as the L knee for arthritis. Total body tremors, most especially in hands and face, were more pronounced today.      Pertinent Vitals/Pain Pain Assessment: 0-10 Pain Score: 5  Pain Location: R knee, with AAROM  Pain Descriptors / Indicators: Grimacing;Guarding;Sore Pain Intervention(s): Limited activity within patient's tolerance;Repositioned;Monitored during session    Home Living                      Prior Function            PT Goals (current goals can now be found in the care plan section) Acute Rehab PT Goals Patient Stated Goal: get stronger  PT Goal Formulation: With patient Time For Goal Achievement: 01/30/18 Potential to Achieve Goals: Good Progress towards PT goals: Progressing toward goals    Frequency    Min 2X/week      PT Plan      Co-evaluation              AM-PAC PT "6 Clicks" Mobility   Outcome Measure  Help needed turning from your back to your side while in a flat bed without using bedrails?: A Lot Help needed moving from lying on your back to sitting on the side of a flat bed without using bedrails?: A Lot Help needed moving to and from a bed to a chair (including a wheelchair)?: Total Help needed standing up from a chair using your arms (e.g., wheelchair or bedside chair)?: Total Help needed to walk in hospital room?: Total Help needed climbing 3-5 steps with a railing? : Total 6 Click Score: 8    End of Session Equipment Utilized During Treatment: Gait belt Activity Tolerance: Patient limited by pain;Patient limited by fatigue;Other (comment)(pt limited by weakness) Patient left: in bed;with call bell/phone within reach;with bed alarm set;Other (comment)(with positioning boots reapplied to prevent bilateral foot inversion/PF) Nurse Communication: Other (comment)(Unable to find RN, and unable to reach RN by  phone) PT Visit Diagnosis: Muscle weakness (generalized) (M62.81);Other abnormalities of gait and mobility (R26.89)     Time: 8676-1950 PT Time Calculation (min) (ACUTE ONLY): 34 min  Charges:  $Therapeutic Exercise: 8-22 mins $Therapeutic Activity: 8-22 mins                    Cheila Wickstrom Conception Chancy, PT Acute Rehabilitation Services Pager [phone]  Office [phone]   Cyan Clippinger D Kenny Stern 01/20/2018, 2:09 PM

## 2018-01-21 DIAGNOSIS — F319 Bipolar disorder, unspecified: Secondary | ICD-10-CM

## 2018-01-21 DIAGNOSIS — G20 Parkinson's disease: Secondary | ICD-10-CM

## 2018-01-21 LAB — CBC
HCT: 34.1 % — ABNORMAL LOW (ref 36.0–46.0)
Hemoglobin: 10.5 g/dL — ABNORMAL LOW (ref 12.0–15.0)
MCH: 28.9 pg (ref 26.0–34.0)
MCHC: 30.8 g/dL (ref 30.0–36.0)
MCV: 93.9 fL (ref 80.0–100.0)
Platelets: 278 10*3/uL (ref 150–400)
RBC: 3.63 MIL/uL — ABNORMAL LOW (ref 3.87–5.11)
RDW: 13.7 % (ref 11.5–15.5)
WBC: 10.3 10*3/uL (ref 4.0–10.5)
nRBC: 0 % (ref 0.0–0.2)

## 2018-01-21 LAB — BASIC METABOLIC PANEL
Anion gap: 11 (ref 5–15)
BUN: 9 mg/dL (ref 8–23)
CO2: 25 mmol/L (ref 22–32)
Calcium: 8.5 mg/dL — ABNORMAL LOW (ref 8.9–10.3)
Chloride: 103 mmol/L (ref 98–111)
Creatinine, Ser: 0.57 mg/dL (ref 0.44–1.00)
GFR calc Af Amer: >60 mL/min (ref >60)
Glucose, Bld: 163 mg/dL — ABNORMAL HIGH (ref 70–99)
Potassium: 3.4 mmol/L — ABNORMAL LOW (ref 3.5–5.1)
Sodium: 139 mmol/L (ref 135–145)

## 2018-01-21 MED ORDER — SIMETHICONE 80 MG PO CHEW
80.0000 mg | CHEWABLE_TABLET | Freq: Once | ORAL | Status: AC
  Administered 2018-01-21: 80 mg via ORAL
  Filled 2018-01-21: qty 1

## 2018-01-21 NOTE — Progress Notes (Signed)
PROGRESS NOTE    [name]  KWI:[id number] DOB: 09-24-1955 DOA: 01/16/2018 PCP: [place], Betty G, MD      Brief Narrative:  [name] is a 62 y.o. F with HTN, OSA, hypothyroidism, and Bipolar disorder on Geodon and Zoloft who presents with progressive inability to walk.  This has been a slowly progressive problem, worse in the last 1-2 weeks (patient had left [facility] in mid November).  In the ER, the patient appeared dehydrated and with dysuria to EDP, started on IV fluids; to admitting phyisician was notably rigid on exam, masked facies. Pt admitted for further management.    Assessment & Plan:  Dyskinesia, rigidity Patient followed in Movement disorders clinic since 2017 for tremor.  Those notes indicate symptomatic improvement with primidone, also highlight some mild parkinsonism more recently, attributed to [facility] on board: Follow up with outpt neurologist Dr. Carles Collet PT/OT  UTI Patient with dysuria, pyuria on UA. Urine culture showing only 60,000 colonies per mL of E. Coli Completed IV Rocephin  Recent knee replacement PT evaluation  COPD RT consulted for symptomatic treatment.  Hypothyroidism Continue levothyroxine  Hypertension Started on metoprolol Not on any home meds  Bipolar disorder Stable Hold Geodon Restarted on amitriptyline as needed, on primidone Advised to follow-up with her outpatient psychiatrist to discuss other medications, pt insisting that she needs to speak to a psychiatrist while she is awaiting placement for medication adjustments Denies SI/HI Psych consult placed   DVT prophylaxis: Lovenox Code Status: FULL Family Communication: None present Disposition: Awaiting SNF placement  Consultants:   Neurology  Psychiatry  Procedures:   None  Antimicrobials:   Ceftriaxone 12/10 >>01/19/18   Subjective: Denies any new complaints. Requesting to see psych. Interested in rehab  Objective: Vitals:   01/20/18 2211  01/21/18 0430 01/21/18 0855 01/21/18 1300  BP: (!) 147/80 (!) 170/86  140/76  Pulse: 92 94 93 92  Resp: (!) 24 (!) 24 17 18   Temp: 98 F (36.7 C) 98.1 F (36.7 C)  98.7 F (37.1 C)  TempSrc: Oral Oral  Oral  SpO2: 96% 97% 97% 97%  Weight:      Height:        Intake/Output Summary (Last 24 hours) at 01/21/2018 1840 Last data filed at 01/21/2018 1822 Gross per 24 hour  Intake 670 ml  Output 5200 ml  Net -4530 ml   Filed Weights   01/16/18 0115  Weight: 68.5 kg    Examination:  General: NAD, tremors noted   Cardiovascular: S1, S2 present  Respiratory: CTAB  Abdomen: Soft, nontender, nondistended, bowel sounds present  Musculoskeletal: No bilateral pedal edema noted  Skin: Normal  Psychiatry: Normal mood    Data Reviewed: I have personally reviewed following labs and imaging studies:  CBC: Recent Labs  Lab 01/16/18 0446 01/17/18 0617 01/18/18 0607 01/19/18 0611 01/20/18 0616 01/21/18 0633  WBC 12.8* 9.7 13.0* 12.0* 10.9* 10.3  NEUTROABS 8.7*  --   --   --   --   --   HGB 10.8* 10.6* 11.1* 9.9* 9.7* 10.5*  HCT 34.1* 34.4* 35.2* 31.9* 31.2* 34.1*  MCV 94.7 95.0 93.1 96.4 95.7 93.9  PLT 338 282 318 228 233 242   Basic Metabolic Panel: Recent Labs  Lab 01/17/18 0617 01/18/18 0607 01/19/18 0611 01/20/18 0616 01/21/18 0633  NA 140 140 139 139 139  K 3.6 3.4* 3.4* 3.8 3.4*  CL 103 102 104 103 103  CO2 30 28 26 28 25   GLUCOSE 152* 151* 158*  146* 163*  BUN 6* 7* 8 9 9   CREATININE 0.69 0.61 0.48 0.62 0.57  CALCIUM 8.4* 8.6* 8.3* 8.5* 8.5*  MG  --  1.8  --   --   --    GFR: Estimated Creatinine Clearance: 63.8 mL/min (by C-G formula based on SCr of 0.57 mg/dL). Liver Function Tests: Recent Labs  Lab 01/16/18 0446  AST 34  ALT 41  ALKPHOS 137*  BILITOT 0.8  PROT 6.5  ALBUMIN 3.5   No results for input(s): LIPASE, AMYLASE in the last 168 hours. No results for input(s): AMMONIA in the last 168 hours. Coagulation Profile: No results for  input(s): INR, PROTIME in the last 168 hours. Cardiac Enzymes: Recent Labs  Lab 01/17/18 1700  CKTOTAL 137   BNP (last 3 results) No results for input(s): PROBNP in the last 8760 hours. HbA1C: No results for input(s): HGBA1C in the last 72 hours. CBG: No results for input(s): GLUCAP in the last 168 hours. Lipid Profile: No results for input(s): CHOL, HDL, LDLCALC, TRIG, CHOLHDL, LDLDIRECT in the last 72 hours. Thyroid Function Tests: No results for input(s): TSH, T4TOTAL, FREET4, T3FREE, THYROIDAB in the last 72 hours. Anemia Panel: No results for input(s): VITAMINB12, FOLATE, FERRITIN, TIBC, IRON, RETICCTPCT in the last 72 hours. Urine analysis:    Component Value Date/Time   COLORURINE YELLOW 01/16/2018 0804   APPEARANCEUR CLOUDY (A) 01/16/2018 0804   LABSPEC 1.010 01/16/2018 0804   PHURINE 6.0 01/16/2018 0804   GLUCOSEU NEGATIVE 01/16/2018 0804   GLUCOSEU NEGATIVE 11/21/2017 1026   HGBUR SMALL (A) 01/16/2018 0804   BILIRUBINUR NEGATIVE 01/16/2018 0804   KETONESUR NEGATIVE 01/16/2018 0804   PROTEINUR NEGATIVE 01/16/2018 0804   UROBILINOGEN 0.2 11/21/2017 1026   NITRITE NEGATIVE 01/16/2018 0804   LEUKOCYTESUR LARGE (A) 01/16/2018 0804   Sepsis Labs: @LABRCNTIP (procalcitonin:4,lacticacidven:4)  ) Recent Results (from the past 240 hour(s))  Culture, Urine     Status: Abnormal   Collection Time: 01/17/18  9:10 AM  Result Value Ref Range Status   Specimen Description   Final    URINE, CLEAN CATCH Performed at [facility], [place] [address], [place], [place] [address]    Special Requests   Final    NONE Performed at [facility], [place] [address] [place], [place] [address]    Culture 60,000 COLONIES/mL ESCHERICHIA COLI (A)  Final   Report Status 01/19/2018 FINAL  Final   Organism ID, Bacteria ESCHERICHIA COLI (A)  Final      Susceptibility   Escherichia coli - MIC*    AMPICILLIN 4 SENSITIVE Sensitive     CEFAZOLIN <=4 SENSITIVE  Sensitive     CEFTRIAXONE <=1 SENSITIVE Sensitive     CIPROFLOXACIN <=0.25 SENSITIVE Sensitive     GENTAMICIN <=1 SENSITIVE Sensitive     IMIPENEM <=0.25 SENSITIVE Sensitive     NITROFURANTOIN <=16 SENSITIVE Sensitive     TRIMETH/SULFA <=20 SENSITIVE Sensitive     AMPICILLIN/SULBACTAM <=2 SENSITIVE Sensitive     PIP/TAZO <=4 SENSITIVE Sensitive     Extended ESBL NEGATIVE Sensitive     * 60,000 COLONIES/mL ESCHERICHIA COLI         Radiology Studies: No results found.      Scheduled Meds: . budesonide (PULMICORT) nebulizer solution  1 mg Nebulization BID  . enoxaparin (LOVENOX) injection  40 mg Subcutaneous Q24H  . hydrOXYzine  25 mg Oral QPC breakfast  . levothyroxine  50 mcg Oral Q0600  . metoprolol tartrate  12.5 mg Oral BID  .  montelukast  10 mg Oral QHS  . multivitamin  1 tablet Oral Daily  . pentosan polysulfate  100 mg Oral BID  . primidone  50 mg Oral BID  . rosuvastatin  20 mg Oral q morning - 10a  . sertraline  100 mg Oral QHS  . thiamine  100 mg Oral Daily   Continuous Infusions: . dextrose 5 % and 0.9% NaCl 75 mL/hr at 01/21/18 1753        . budesonide (PULMICORT) nebulizer solution  1 mg Nebulization BID  . enoxaparin (LOVENOX) injection  40 mg Subcutaneous Q24H  . hydrOXYzine  25 mg Oral QPC breakfast  . levothyroxine  50 mcg Oral Q0600  . metoprolol tartrate  12.5 mg Oral BID  . montelukast  10 mg Oral QHS  . multivitamin  1 tablet Oral Daily  . pentosan polysulfate  100 mg Oral BID  . primidone  50 mg Oral BID  . rosuvastatin  20 mg Oral q morning - 10a  . sertraline  100 mg Oral QHS  . thiamine  100 mg Oral Daily   . dextrose 5 % and 0.9% NaCl 75 mL/hr at 01/21/18 1753    Current Meds  Medication Sig  . albuterol (PROVENTIL HFA;VENTOLIN HFA) 108 (90 Base) MCG/ACT inhaler Inhale 1-2 puffs into the lungs daily as needed for wheezing or shortness of breath.  Marland Kitchen albuterol (PROVENTIL) (2.5 MG/3ML) 0.083% nebulizer solution TAKE 3 MLS BY  NEBULIZATION EVERY 6 HOURS AS NEEDED FOR WHEEZING OR SHORTNESS OF BREATH. (Patient taking differently: Take 2.5 mg by nebulization every 6 (six) hours as needed for wheezing or shortness of breath. )  . alendronate (FOSAMAX) 70 MG tablet TAKE 1 TABLET BY MOUTH EVERY 7 DAYS WITH FULL GLASS OF WATER ON EMPTY STOMACH (Patient taking differently: Take 70 mg by mouth once a week. )  . amitriptyline (ELAVIL) 10 MG tablet Take 10 mg by mouth at bedtime as needed for sleep.   Marland Kitchen anti-nausea (EMETROL) solution Take 30 mLs by mouth every 15 (fifteen) minutes as needed for nausea or vomiting.  . Azelastine HCl 0.15 % SOLN PLACE 2 SPRAYS INTO THE NOSE DAILY AS NEEDED. (Patient taking differently: Place 2 sprays into the nose daily as needed (congestion). )  . Carbinoxamine Maleate 4 MG TABS TAKE 1 TABLET BY MOUTH EVERY 8 HOURS AS NEEDED (Patient taking differently: Take 4 mg by mouth every 8 (eight) hours as needed (allergies). )  . ELMIRON 100 MG capsule Take 100 mg by mouth 2 (two) times daily.   . fluticasone (FLOVENT HFA) 220 MCG/ACT inhaler TAKE 2 PUFFS BY MOUTH TWICE A DAY (Patient taking differently: Inhale 2 puffs into the lungs 2 (two) times daily. )  . gabapentin (NEURONTIN) 300 MG capsule Take 300 mg by mouth 3 (three) times daily.   . hydrocortisone cream 1 % Apply 1 application topically 2 (two) times daily as needed for itching.   . hydrOXYzine (ATARAX/VISTARIL) 25 MG tablet Take 25 mg by mouth daily after breakfast.   . loperamide (IMODIUM A-D) 2 MG tablet Take 2-4 mg by mouth 4 (four) times daily as needed for diarrhea or loose stools.   . methocarbamol (ROBAXIN) 500 MG tablet Take 1 tablet (500 mg total) by mouth every 6 (six) hours as needed for muscle spasms.  . mometasone (NASONEX) 50 MCG/ACT nasal spray Place 2 sprays into the nose every morning.   . montelukast (SINGULAIR) 10 MG tablet Take 1 tablet (10 mg total) by mouth at bedtime. (  Patient taking differently: Take 10 mg by mouth at bedtime.  )  . olopatadine (PATANOL) 0.1 % ophthalmic solution Place 1 drop into both eyes 2 (two) times daily as needed for allergies.  Marland Kitchen oxyCODONE (OXY IR/ROXICODONE) 5 MG immediate release tablet Take 1-2 tablets (5-10 mg total) by mouth every 6 (six) hours as needed for moderate pain (pain score 4-6).  Marland Kitchen primidone (MYSOLINE) 50 MG tablet Take 1 tablet (50 mg total) by mouth 2 (two) times daily.  . rivaroxaban (XARELTO) 10 MG TABS tablet Take 1 tablet (10 mg total) by mouth daily with breakfast for 19 days. Take one 10 mg xarelto once a day for three weeks following surgery to prevent blood clots.  . rizatriptan (MAXALT-MLT) 10 MG disintegrating tablet TAKE 1 TABLET EVERY DAY AS NEEDED FOR MIGRAINE, MAY REPEAT IN 2 HRS IF NEEDED. MAX 2 DOSES/WK (Patient taking differently: Take 10 mg by mouth every 2 (two) hours as needed for migraine. )  . sertraline (ZOLOFT) 100 MG tablet Take 100 mg by mouth at bedtime.   Marland Kitchen umeclidinium bromide (INCRUSE ELLIPTA) 62.5 MCG/INH AEPB Inhale 1 puff into the lungs daily.  . ziprasidone (GEODON) 40 MG capsule Take 40 mg by mouth 2 (two) times daily. Morning & with supper  . [DISCONTINUED] estradiol (ESTRACE) 0.1 MG/GM vaginal cream Place 1 Applicatorful vaginally once a week.  . [DISCONTINUED] levothyroxine (SYNTHROID, LEVOTHROID) 50 MCG tablet TAKE 1 TABLET EVERY DAY--- takes in am (Patient taking differently: Take 50 mcg by mouth daily before breakfast. )  . [DISCONTINUED] rosuvastatin (CRESTOR) 20 MG tablet TAKE 1 TABLET BY MOUTH EVERY DAY (Patient taking differently: Take 20 mg by mouth every morning. )   acetaminophen **OR** acetaminophen, albuterol, amitriptyline, cyclobenzaprine, ondansetron **OR** ondansetron (ZOFRAN) IV, oxyCODONE    Tanga Gloor J Kylah Maresh,MD [facility] 01/21/2018, 6:40 PM

## 2018-01-22 ENCOUNTER — Other Ambulatory Visit: Payer: Self-pay | Admitting: Family Medicine

## 2018-01-22 DIAGNOSIS — M810 Age-related osteoporosis without current pathological fracture: Secondary | ICD-10-CM

## 2018-01-22 DIAGNOSIS — F3111 Bipolar disorder, current episode manic without psychotic features, mild: Secondary | ICD-10-CM

## 2018-01-22 LAB — CBC WITH DIFFERENTIAL/PLATELET
Abs Immature Granulocytes: 0.12 10*3/uL — ABNORMAL HIGH (ref 0.00–0.07)
Basophils Absolute: 0 10*3/uL (ref 0.0–0.1)
Basophils Relative: 0 %
Eosinophils Absolute: 0.3 10*3/uL (ref 0.0–0.5)
Eosinophils Relative: 3 %
HCT: 34 % — ABNORMAL LOW (ref 36.0–46.0)
Hemoglobin: 10.8 g/dL — ABNORMAL LOW (ref 12.0–15.0)
Immature Granulocytes: 1 %
Lymphocytes Relative: 19 %
Lymphs Abs: 2.2 10*3/uL (ref 0.7–4.0)
MCH: 29.9 pg (ref 26.0–34.0)
MCHC: 31.8 g/dL (ref 30.0–36.0)
MCV: 94.2 fL (ref 80.0–100.0)
MONOS PCT: 8 %
Monocytes Absolute: 0.9 10*3/uL (ref 0.1–1.0)
Neutro Abs: 8.2 10*3/uL — ABNORMAL HIGH (ref 1.7–7.7)
Neutrophils Relative %: 69 %
Platelets: 283 10*3/uL (ref 150–400)
RBC: 3.61 MIL/uL — ABNORMAL LOW (ref 3.87–5.11)
RDW: 13.8 % (ref 11.5–15.5)
WBC: 11.9 10*3/uL — ABNORMAL HIGH (ref 4.0–10.5)
nRBC: 0 % (ref 0.0–0.2)

## 2018-01-22 LAB — BASIC METABOLIC PANEL
Anion gap: 10 (ref 5–15)
BUN: 8 mg/dL (ref 8–23)
CO2: 28 mmol/L (ref 22–32)
Calcium: 8.9 mg/dL (ref 8.9–10.3)
Chloride: 102 mmol/L (ref 98–111)
Creatinine, Ser: 0.56 mg/dL (ref 0.44–1.00)
GFR calc Af Amer: >60 mL/min (ref >60)
GFR calc non Af Amer: >60 mL/min (ref >60)
Glucose, Bld: 147 mg/dL — ABNORMAL HIGH (ref 70–99)
Potassium: 3.2 mmol/L — ABNORMAL LOW (ref 3.5–5.1)
Sodium: 140 mmol/L (ref 135–145)

## 2018-01-22 MED ORDER — POTASSIUM CHLORIDE CRYS ER 20 MEQ PO TBCR
40.0000 meq | EXTENDED_RELEASE_TABLET | Freq: Once | ORAL | Status: AC
  Administered 2018-01-22: 40 meq via ORAL
  Filled 2018-01-22: qty 2

## 2018-01-22 MED ORDER — CARBAMAZEPINE ER 100 MG PO TB12
100.0000 mg | ORAL_TABLET | Freq: Two times a day (BID) | ORAL | Status: DC
  Administered 2018-01-22 – 2018-01-24 (×5): 100 mg via ORAL
  Filled 2018-01-22 (×6): qty 1

## 2018-01-22 NOTE — Consult Note (Signed)
[facility] Face-to-Face Psychiatry Consult   Reason for Consult:  History of Bipolar, needs medication adjustment Referring Physician:  Dr. Jarvis Newcomer Patient Identification: [name] MRN:  [id number] Principal Diagnosis: Bipolar affective ([place]) Diagnosis:  Principal Problem:   Bipolar affective ([place]) Active Problems:   Bipolar 1 disorder, mixed, moderate ([place])   Ambulatory dysfunction   Serotonin syndrome   Total Time spent with patient: 45 minutes  Subjective:   [name] is a 62 y.o. female patient admitted difficulty walking.  HPI:  Patient who reports history of HTN, OSA, hypothyroidism, and Bipolar disorder. She states that she was admitted to the hospital due to progressive inability to walk in the last 2 weeks. She reports that her Bipolar medications were discontinued since a week ago to make sure that her medications are not the cause of difficulty walking, patient was on Geodon and Gabapentin. Now, patient denies psychosis, delusions, mood swings, SI/HI but requesting for a mood stabilizer to avoid getting manic.  Past Psychiatric History: as above  Risk to Self:  denies Risk to Others:  denies Prior Inpatient Therapy:   Prior Outpatient Therapy:    Past Medical History:  Past Medical History:  Diagnosis Date  . Allergic rhinitis   . Anxiety   . Benign essential tremor    hands  . Bipolar 1 disorder, mixed, moderate ([place])   . Claustrophobia   . Depression   . Eczema   . Frequency of urination   . History of frequent urinary tract infections   . History of gastroesophageal reflux (GERD)    05-25-2017  per pt no issues since hiatal hernia repair 01/ 2018  . History of hiatal hernia   . History of melanoma excision    early 2000s-- BACK  . History of panic attacks   . 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 Hypothyroidism   . Intermittent palpitations    cardiology--  dr [place]ique  . Interstitial cystitis   . Limited jaw range of  motion    s/p  bilateral TMJ surgery,  age 70s  . Migraines   . Moderate persistent asthma    pulmologist-- dr Halford Chessman  . OA (osteoarthritis)    both knees  . OSA on CPAP    per last sleep study 09/ 2017  mild OSA, AHI13/hr  . PONV (postoperative nausea and vomiting)   . PVC (premature ventricular contraction)   . Rosacea   . Sensation of pressure in bladder area    per pt intermittant  . TMJ arthralgia   . Urticaria   . Wears glasses   . White coat syndrome without hypertension    05-25-2017  per pt hx hypertension yrs ago, no issues after quitting stressful job    Past Surgical History:  Procedure Laterality Date  . [place] STUDY N/A 09/22/2015   Procedure: [place] STUDY;  Surgeon: Doran Stabler, MD;  Location: [facility];  Service: Gastroenterology;  Laterality: N/A;  . ADENOIDECTOMY    . ANAL FISSURE REPAIR  age 3  (19)  . BREAST EXCISIONAL BIOPSY Right 04-16-2003  dr p. young  [place]   benign  . BUNIONECTOMY Right early 2000s  . CARPAL TUNNEL RELEASE Left age 67 (59)  . CHOLECYSTECTOMY OPEN  age 20  . CYSTO WITH HYDRODISTENSION N/A 06/02/2017   Procedure: CYSTOSCOPY/HYDRODISTENSION OF BLADDER;  Surgeon: Irine Seal, MD;  Location: [facility];  Service: Urology;  Laterality: N/A;  .  CYSTO/ HYDRODISTENTION/  INSTILLATION THERAPY  1990s  . DX LAPAROSCOPY/  DX HYSTEROSCOPY/  D & C  08-07-2007   dr dove  [facility]  . ENDOMETRIAL ABLATION W/ NOVASURE  10-21-2008   dr Harolyn Rutherford  [facility]  . ESOPHAGEAL MANOMETRY N/A 09/22/2015   Procedure: ESOPHAGEAL MANOMETRY (EM);  Surgeon: Doran Stabler, MD;  Location: [facility];  Service: Gastroenterology;  Laterality: N/A;  with impedence   . FINGER ARTHROPLASTY Bilateral    left little finger 2016:  right middle finger 06/ 2018  . KNEE ARTHROSCOPY Bilateral x3 left ;  x3  right-- last one age 49 (41)  . NASAL SEPTUM SURGERY  age 8s  . ROBOT ASSISTED REDUCTION PARAESOPHAGEAL HIATAL HERNIA/ TYPE 2 MEDIASTINAL DISSECTION/  PRIMARY HIATAL HERNIA REPAIR/ ANTERIOR & POSTERIOR GASTROPEXY/ NISSEN FUNDOPLICATION  [phone]   DR GROSS  [facility]  . SINOSCOPY    . TEMPOROMANDIBULAR JOINT SURGERY Bilateral x3  -- age 49s   per pt used graft  . TONSILLECTOMY    . TOTAL KNEE ARTHROPLASTY Left 12/26/2017   Procedure: LEFT TOTAL KNEE ARTHROPLASTY;  Surgeon: Gaynelle Arabian, MD;  Location: [facility];  Service: Orthopedics;  Laterality: Left;  23min  . TRANSTHORACIC ECHOCARDIOGRAM  12-06-2017   dr [place]ique   ef 55-60%, grade 1 diastoilc dysfunction, trivial MR  . TRIGGER FINGER RELEASE Bilateral last one 2017   several release's bilaterally  . ULNAR NERVE TRANSPOSITION Bilateral age 53 (79)   Family History:  Family History  Problem Relation Age of Onset  . Hypertension Mother        deceased from MVA complications  . Thyroid disease Mother   . Allergic rhinitis Mother   . Testicular cancer Father   . Allergic rhinitis Father   . Colon polyps Sister   . Coronary artery disease Unknown    Family Psychiatric  History:  Social History:  Social History   Substance and Sexual Activity  Alcohol Use Yes  . Alcohol/week: 0.0 standard drinks   Comment: once every 3-4 months     Social History   Substance and Sexual Activity  Drug Use No    Social History   Socioeconomic History  . Marital status: Divorced    Spouse name: Not on file  . Number of children: 0  . Years of education: Not on file  . Highest education level: Not on file  Occupational History  . Not on file  Social Needs  . Financial resource strain: Not on file  . Food insecurity:    Worry: Not on file    Inability: Not on file  . Transportation needs:    Medical: Not on file    Non-medical: Not on file  Tobacco Use  . Smoking status: Never Smoker  . Smokeless tobacco: Never Used  Substance and Sexual Activity  . Alcohol use: Yes    Alcohol/week: 0.0 standard drinks    Comment: once every 3-4 months  . Drug use: No  . Sexual activity: Not on  file  Lifestyle  . Physical activity:    Days per week: Not on file    Minutes per session: Not on file  . Stress: Not on file  Relationships  . Social connections:    Talks on phone: Not on file    Gets together: Not on file    Attends religious service: Not on file    Active member of club or organization: Not on file    Attends meetings of clubs or organizations: Not  on file    Relationship status: Not on file  Other Topics Concern  . Not on file  Social History Narrative  . Not on file   Additional Social History:    Allergies:   Allergies  Allergen Reactions  . Indomethacin Other (See Comments)    Muscle spasms Causes muscle spasms in neck  . Amlodipine Swelling    Peripheral edema  . Pseudoephedrine Other (See Comments)    I fly off the walls  CANNOT SLEEP  . Risperidone And Related Other (See Comments)    Stomach upset, insomnia, drooling, tremors, "jerks," sensitivity to touch.   . Bupivacaine Hives  . Pentazocine Other (See Comments)    Unknown reaction MADE ME "LACTATE"  . Propoxyphene Itching and Nausea And Vomiting    darvocet  . Sulfa Antibiotics Other (See Comments)    Unknown reaction  . Tramadol Other (See Comments)    Patient can not remember  . Aspirin Other (See Comments)    upset stomach, ringing in the ears  . Codeine Itching and Other (See Comments)    Anything related to codeine  . Etodolac Rash and Other (See Comments)  . Ivp Dye [Iodinated Diagnostic Agents] Itching    Flushed and Fever, itch all over  . Propranolol Nausea Only and Other (See Comments)    Labs:  Results for orders placed or performed during the hospital encounter of 01/16/18 (from the past 48 hour(s))  Basic metabolic panel     Status: Abnormal   Collection Time: 01/21/18  6:33 AM  Result Value Ref Range   Sodium 139 135 - 145 mmol/L   Potassium 3.4 (L) 3.5 - 5.1 mmol/L   Chloride 103 98 - 111 mmol/L   CO2 25 22 - 32 mmol/L   Glucose, Bld 163 (H) 70 - 99 mg/dL    BUN 9 8 - 23 mg/dL   Creatinine, Ser 0.57 0.44 - 1.00 mg/dL   Calcium 8.5 (L) 8.9 - 10.3 mg/dL   GFR calc non Af Amer >60 >60 mL/min   GFR calc Af Amer >60 >60 mL/min   Anion gap 11 5 - 15    Comment: Performed at [facility], [place] [address] [place], [place] [address]  CBC     Status: Abnormal   Collection Time: 01/21/18  6:33 AM  Result Value Ref Range   WBC 10.3 4.0 - 10.5 K/uL   RBC 3.63 (L) 3.87 - 5.11 MIL/uL   Hemoglobin 10.5 (L) 12.0 - 15.0 g/dL   HCT 34.1 (L) 36.0 - 46.0 %   MCV 93.9 80.0 - 100.0 fL   MCH 28.9 26.0 - 34.0 pg   MCHC 30.8 30.0 - 36.0 g/dL   RDW 13.7 11.5 - 15.5 %   Platelets 278 150 - 400 K/uL   nRBC 0.0 0.0 - 0.2 %    Comment: Performed at [facility], [place] [address], [place], [place] [address]  Basic metabolic panel     Status: Abnormal   Collection Time: 01/22/18  6:50 AM  Result Value Ref Range   Sodium 140 135 - 145 mmol/L   Potassium 3.2 (L) 3.5 - 5.1 mmol/L   Chloride 102 98 - 111 mmol/L   CO2 28 22 - 32 mmol/L   Glucose, Bld 147 (H) 70 - 99 mg/dL   BUN 8 8 - 23 mg/dL   Creatinine, Ser 0.56 0.44 - 1.00 mg/dL   Calcium 8.9 8.9 - 10.3 mg/dL   GFR calc  non Af Amer >60 >60 mL/min   GFR calc Af Amer >60 >60 mL/min   Anion gap 10 5 - 15    Comment: Performed at [facility], [place] [address], [place], [place] [address]  CBC with Differential/Platelet     Status: Abnormal   Collection Time: 01/22/18  6:50 AM  Result Value Ref Range   WBC 11.9 (H) 4.0 - 10.5 K/uL   RBC 3.61 (L) 3.87 - 5.11 MIL/uL   Hemoglobin 10.8 (L) 12.0 - 15.0 g/dL   HCT 34.0 (L) 36.0 - 46.0 %   MCV 94.2 80.0 - 100.0 fL   MCH 29.9 26.0 - 34.0 pg   MCHC 31.8 30.0 - 36.0 g/dL   RDW 13.8 11.5 - 15.5 %   Platelets 283 150 - 400 K/uL   nRBC 0.0 0.0 - 0.2 %   Neutrophils Relative % 69 %   Neutro Abs 8.2 (H) 1.7 - 7.7 K/uL   Lymphocytes Relative 19 %   Lymphs Abs 2.2 0.7 - 4.0 K/uL   Monocytes Relative 8 %   Monocytes  Absolute 0.9 0.1 - 1.0 K/uL   Eosinophils Relative 3 %   Eosinophils Absolute 0.3 0.0 - 0.5 K/uL   Basophils Relative 0 %   Basophils Absolute 0.0 0.0 - 0.1 K/uL   Immature Granulocytes 1 %   Abs Immature Granulocytes 0.12 (H) 0.00 - 0.07 K/uL    Comment: Performed at [facility], [place] [address], [place], [place] [address]    Current Facility-Administered Medications  Medication Dose Route Frequency Provider Last Rate Last Dose  . acetaminophen (TYLENOL) tablet 650 mg  650 mg Oral Q6H PRN Arrien, Jimmy Picket, MD   650 mg at 01/20/18 1546   Or  . acetaminophen (TYLENOL) suppository 650 mg  650 mg Rectal Q6H PRN Arrien, Jimmy Picket, MD      . albuterol (PROVENTIL) (2.5 MG/3ML) 0.083% nebulizer solution 2.5 mg  2.5 mg Nebulization Daily PRN Arrien, Jimmy Picket, MD      . amitriptyline (ELAVIL) tablet 10 mg  10 mg Oral QHS PRN Edwin Dada, MD      . budesonide (PULMICORT) nebulizer solution 1 mg  1 mg Nebulization BID Arrien, Jimmy Picket, MD   1 mg at 01/22/18 0816  . cyclobenzaprine (FLEXERIL) tablet 5 mg  5 mg Oral BID PRN Yaakov Guthrie, MD   5 mg at 01/22/18 0214  . enoxaparin (LOVENOX) injection 40 mg  40 mg Subcutaneous Q24H Tawni Millers, MD   40 mg at 01/21/18 2206  . hydrOXYzine (ATARAX/VISTARIL) tablet 25 mg  25 mg Oral QPC breakfast Arrien, Jimmy Picket, MD   25 mg at 01/22/18 0853  . levothyroxine (SYNTHROID, LEVOTHROID) tablet 50 mcg  50 mcg Oral Q0600 Tawni Millers, MD   50 mcg at 01/22/18 0519  . metoprolol tartrate (LOPRESSOR) tablet 12.5 mg  12.5 mg Oral BID Matcha, Anupama, MD   12.5 mg at 01/22/18 1028  . montelukast (SINGULAIR) tablet 10 mg  10 mg Oral QHS Arrien, Jimmy Picket, MD   10 mg at 01/21/18 2207  . multivitamin (PROSIGHT) tablet 1 tablet  1 tablet Oral Daily Arrien, Jimmy Picket, MD   1 tablet at 01/22/18 1028  . ondansetron (ZOFRAN) tablet 4 mg  4 mg Oral Q6H PRN Arrien, Jimmy Picket,  MD       Or  . ondansetron [facility]) injection 4 mg  4 mg Intravenous Q6H PRN Arrien, Jimmy Picket, MD  4 mg at 01/19/18 1004  . oxyCODONE (Oxy IR/ROXICODONE) immediate release tablet 5 mg  5 mg Oral Q6H PRN Arrien, Jimmy Picket, MD   5 mg at 01/22/18 0214  . pentosan polysulfate (ELMIRON) capsule 100 mg  100 mg Oral BID Edwin Dada, MD   100 mg at 01/22/18 1028  . primidone (MYSOLINE) tablet 50 mg  50 mg Oral BID Tawni Millers, MD   50 mg at 01/22/18 1028  . rosuvastatin (CRESTOR) tablet 20 mg  20 mg Oral q morning - 10a Arrien, Jimmy Picket, MD   20 mg at 01/22/18 1028  . sertraline (ZOLOFT) tablet 100 mg  100 mg Oral QHS Edwin Dada, MD   100 mg at 01/21/18 2207  . thiamine (VITAMIN B-1) tablet 100 mg  100 mg Oral Daily Arrien, Jimmy Picket, MD   100 mg at 01/22/18 1028    Musculoskeletal: Strength & Muscle Tone: not tested  Gait & Station: not tested Patient leans: N/A  Psychiatric Specialty Exam: Physical Exam  Psychiatric: She has a normal mood and affect. Judgment and thought content normal. Her speech is delayed. She is slowed. Cognition and memory are normal.    Review of Systems  Constitutional: Positive for malaise/fatigue.  HENT: Negative.   Eyes: Negative.   Gastrointestinal: Negative.   Skin: Negative.   Psychiatric/Behavioral: Negative.     Blood pressure (!) 157/95, pulse 91, temperature 98.7 F (37.1 C), temperature source Oral, resp. rate 16, height 5' (1.524 m), weight 68.5 kg, last menstrual period 03/05/2012, SpO2 97 %.Body mass index is 29.49 kg/m.  General Appearance: Casual  Eye Contact:  Good  Speech:  Clear and Coherent and Slow  Volume:  Decreased  Mood:  Euthymic  Affect:  Congruent  Thought Process:  Coherent and Linear  Orientation:  Full (Time, Place, and Person)  Thought Content:  Logical  Suicidal Thoughts:  No  Homicidal Thoughts:  No  Memory:  Immediate;   Fair Recent;   Fair Remote;   Fair   Judgement:  Intact  Insight:  Fair  Psychomotor Activity:  Psychomotor Retardation  Concentration:  Concentration: Fair and Attention Span: Fair  Recall:  [employer] of Knowledge:  Good  Language:  Fair  Akathisia:  No  Handed:  Right  AIMS (if indicated):     Assets:  Communication Skills Desire for Improvement  ADL's:  Intact  Cognition:  WNL  Sleep:        Treatment Plan Summary: -Consider adding Carbamazepine 100 mg Bid for Bipolar. -Avoid antipsychotic medications until the cause of patient's muscle rigidity is determined. -Patient agreed to discuss the need for other antipsychotic other than Geodon with her regular psychiatrist. -Psychiatric service signing out. Re-consult psych service if needed - Disposition: No evidence of imminent risk to self or others at present.   Patient does not meet criteria for psychiatric inpatient admission. Supportive therapy provided about ongoing stressors.  Corena Pilgrim, MD 01/22/2018 12:29 PM

## 2018-01-22 NOTE — Progress Notes (Signed)
PROGRESS NOTE    [name]  JEH:[id number] DOB: [birth year] DOA: 01/16/2018 PCP: Martinique, Betty G, MD      Brief Narrative:  [name] is a 62 y.o. F with HTN, OSA, hypothyroidism, and Bipolar disorder on Geodon and Zoloft who presents with progressive inability to walk.  This has been a slowly progressive problem, worse in the last 1-2 weeks (patient had left TKA in mid November).  In the ER, the patient appeared dehydrated and with dysuria to EDP, started on IV fluids; to admitting phyisician was notably rigid on exam, masked facies. Pt admitted for further management.    Assessment & Plan:  Dyskinesia, rigidity Patient followed in Movement disorders clinic since 2017 for tremor.  Those notes indicate symptomatic improvement with primidone, also highlight some mild parkinsonism more recently, attributed to [facility] Neurology on board: Follow up with outpt neurologist Dr. Carles Collet PT/OT  UTI Patient with dysuria, pyuria on UA. Urine culture showing only 60,000 colonies per mL of E. Coli Completed IV Rocephin  Recent knee replacement PT evaluation  COPD RT consulted for symptomatic treatment.  Hypothyroidism Continue levothyroxine  Hypertension Started on metoprolol Not on any home meds  Bipolar disorder Stable Hold Geodon Restarted on amitriptyline as needed, on primidone Advised to follow-up with her outpatient psychiatrist to discuss other medications, pt insisting that she needs to speak to a psychiatrist while she is awaiting placement for medication adjustments Denies SI/HI Psych consult placed, rec starting PO carbamazepine and following up with outpt psych   DVT prophylaxis: Lovenox Code Status: FULL Family Communication: None present Disposition: Awaiting SNF placement  Consultants:   Neurology  Psychiatry  Procedures:   None  Antimicrobials:   Ceftriaxone 12/10 >>01/19/18   Subjective: Pt continues to complain about staff members not  meeting her needs, reassured patient. Denies any new complaints. Pt still with inability to ambulate.  Objective: Vitals:   01/21/18 2108 01/22/18 0619 01/22/18 0816 01/22/18 1400  BP: (!) 170/90 (!) 157/95  (!) 149/75  Pulse: (!) 101 97 91 84  Resp: 17 17 16 18   Temp: 98.5 F (36.9 C) 98.7 F (37.1 C)  98.2 F (36.8 C)  TempSrc: Oral Oral  Oral  SpO2: 98% 99% 97% 98%  Weight:      Height:        Intake/Output Summary (Last 24 hours) at 01/22/2018 1517 Last data filed at 01/22/2018 1300 Gross per 24 hour  Intake 966.2 ml  Output 3750 ml  Net -2783.8 ml   Filed Weights   01/16/18 0115  Weight: 68.5 kg    Examination:  General: NAD, tremors noted   Cardiovascular: S1, S2 present  Respiratory: CTAB  Abdomen: Soft, nontender, nondistended, bowel sounds present  Musculoskeletal: No bilateral pedal edema noted  Skin: Normal  Psychiatry: Normal mood    Data Reviewed: I have personally reviewed following labs and imaging studies:  CBC: Recent Labs  Lab 01/16/18 0446  01/18/18 0607 01/19/18 0611 01/20/18 0616 01/21/18 0633 01/22/18 0650  WBC 12.8*   < > 13.0* 12.0* 10.9* 10.3 11.9*  NEUTROABS 8.7*  --   --   --   --   --  8.2*  HGB 10.8*   < > 11.1* 9.9* 9.7* 10.5* 10.8*  HCT 34.1*   < > 35.2* 31.9* 31.2* 34.1* 34.0*  MCV 94.7   < > 93.1 96.4 95.7 93.9 94.2  PLT 338   < > 318 228 233 278 283   < > = values in this  interval not displayed.   Basic Metabolic Panel: Recent Labs  Lab 01/18/18 0607 01/19/18 0611 01/20/18 0616 01/21/18 0633 01/22/18 0650  NA 140 139 139 139 140  K 3.4* 3.4* 3.8 3.4* 3.2*  CL 102 104 103 103 102  CO2 28 26 28 25 28   GLUCOSE 151* 158* 146* 163* 147*  BUN 7* 8 9 9 8   CREATININE 0.61 0.48 0.62 0.57 0.56  CALCIUM 8.6* 8.3* 8.5* 8.5* 8.9  MG 1.8  --   --   --   --    GFR: Estimated Creatinine Clearance: 63.8 mL/min (by C-G formula based on SCr of 0.56 mg/dL). Liver Function Tests: Recent Labs  Lab 01/16/18 0446    AST 34  ALT 41  ALKPHOS 137*  BILITOT 0.8  PROT 6.5  ALBUMIN 3.5   No results for input(s): LIPASE, AMYLASE in the last 168 hours. No results for input(s): AMMONIA in the last 168 hours. Coagulation Profile: No results for input(s): INR, PROTIME in the last 168 hours. Cardiac Enzymes: Recent Labs  Lab 01/17/18 1700  CKTOTAL 137   BNP (last 3 results) No results for input(s): PROBNP in the last 8760 hours. HbA1C: No results for input(s): HGBA1C in the last 72 hours. CBG: No results for input(s): GLUCAP in the last 168 hours. Lipid Profile: No results for input(s): CHOL, HDL, LDLCALC, TRIG, CHOLHDL, LDLDIRECT in the last 72 hours. Thyroid Function Tests: No results for input(s): TSH, T4TOTAL, FREET4, T3FREE, THYROIDAB in the last 72 hours. Anemia Panel: No results for input(s): VITAMINB12, FOLATE, FERRITIN, TIBC, IRON, RETICCTPCT in the last 72 hours. Urine analysis:    Component Value Date/Time   COLORURINE YELLOW 01/16/2018 0804   APPEARANCEUR CLOUDY (A) 01/16/2018 0804   LABSPEC 1.010 01/16/2018 0804   PHURINE 6.0 01/16/2018 0804   GLUCOSEU NEGATIVE 01/16/2018 0804   GLUCOSEU NEGATIVE 11/21/2017 1026   HGBUR SMALL (A) 01/16/2018 0804   BILIRUBINUR NEGATIVE 01/16/2018 0804   KETONESUR NEGATIVE 01/16/2018 0804   PROTEINUR NEGATIVE 01/16/2018 0804   UROBILINOGEN 0.2 11/21/2017 1026   NITRITE NEGATIVE 01/16/2018 0804   LEUKOCYTESUR LARGE (A) 01/16/2018 0804   Sepsis Labs: @LABRCNTIP (procalcitonin:4,lacticacidven:4)  ) Recent Results (from the past 240 hour(s))  Culture, Urine     Status: Abnormal   Collection Time: 01/17/18  9:10 AM  Result Value Ref Range Status   Specimen Description   Final    URINE, CLEAN CATCH Performed at [facility], [place] [address], [place], [place] [address]    Special Requests   Final    NONE Performed at [facility], [place] [address], [place], [place] [address]    Culture 60,000 COLONIES/mL  ESCHERICHIA COLI (A)  Final   Report Status 01/19/2018 FINAL  Final   Organism ID, Bacteria ESCHERICHIA COLI (A)  Final      Susceptibility   Escherichia coli - MIC*    AMPICILLIN 4 SENSITIVE Sensitive     CEFAZOLIN <=4 SENSITIVE Sensitive     CEFTRIAXONE <=1 SENSITIVE Sensitive     CIPROFLOXACIN <=0.25 SENSITIVE Sensitive     GENTAMICIN <=1 SENSITIVE Sensitive     IMIPENEM <=0.25 SENSITIVE Sensitive     NITROFURANTOIN <=16 SENSITIVE Sensitive     TRIMETH/SULFA <=20 SENSITIVE Sensitive     AMPICILLIN/SULBACTAM <=2 SENSITIVE Sensitive     PIP/TAZO <=4 SENSITIVE Sensitive     Extended ESBL NEGATIVE Sensitive     * 60,000 COLONIES/mL ESCHERICHIA COLI         Radiology Studies:  No results found.      Scheduled Meds: . budesonide (PULMICORT) nebulizer solution  1 mg Nebulization BID  . carbamazepine  100 mg Oral BID  . enoxaparin (LOVENOX) injection  40 mg Subcutaneous Q24H  . hydrOXYzine  25 mg Oral QPC breakfast  . levothyroxine  50 mcg Oral Q0600  . metoprolol tartrate  12.5 mg Oral BID  . montelukast  10 mg Oral QHS  . multivitamin  1 tablet Oral Daily  . pentosan polysulfate  100 mg Oral BID  . primidone  50 mg Oral BID  . rosuvastatin  20 mg Oral q morning - 10a  . sertraline  100 mg Oral QHS  . thiamine  100 mg Oral Daily   Continuous Infusions:       . budesonide (PULMICORT) nebulizer solution  1 mg Nebulization BID  . carbamazepine  100 mg Oral BID  . enoxaparin (LOVENOX) injection  40 mg Subcutaneous Q24H  . hydrOXYzine  25 mg Oral QPC breakfast  . levothyroxine  50 mcg Oral Q0600  . metoprolol tartrate  12.5 mg Oral BID  . montelukast  10 mg Oral QHS  . multivitamin  1 tablet Oral Daily  . pentosan polysulfate  100 mg Oral BID  . primidone  50 mg Oral BID  . rosuvastatin  20 mg Oral q morning - 10a  . sertraline  100 mg Oral QHS  . thiamine  100 mg Oral Daily     Current Meds  Medication Sig  . albuterol (PROVENTIL HFA;VENTOLIN HFA) 108 (90  Base) MCG/ACT inhaler Inhale 1-2 puffs into the lungs daily as needed for wheezing or shortness of breath.  Marland Kitchen albuterol (PROVENTIL) (2.5 MG/3ML) 0.083% nebulizer solution TAKE 3 MLS BY NEBULIZATION EVERY 6 HOURS AS NEEDED FOR WHEEZING OR SHORTNESS OF BREATH. (Patient taking differently: Take 2.5 mg by nebulization every 6 (six) hours as needed for wheezing or shortness of breath. )  . alendronate (FOSAMAX) 70 MG tablet TAKE 1 TABLET BY MOUTH EVERY 7 DAYS WITH FULL GLASS OF WATER ON EMPTY STOMACH (Patient taking differently: Take 70 mg by mouth once a week. )  . amitriptyline (ELAVIL) 10 MG tablet Take 10 mg by mouth at bedtime as needed for sleep.   Marland Kitchen anti-nausea (EMETROL) solution Take 30 mLs by mouth every 15 (fifteen) minutes as needed for nausea or vomiting.  . Azelastine HCl 0.15 % SOLN PLACE 2 SPRAYS INTO THE NOSE DAILY AS NEEDED. (Patient taking differently: Place 2 sprays into the nose daily as needed (congestion). )  . Carbinoxamine Maleate 4 MG TABS TAKE 1 TABLET BY MOUTH EVERY 8 HOURS AS NEEDED (Patient taking differently: Take 4 mg by mouth every 8 (eight) hours as needed (allergies). )  . ELMIRON 100 MG capsule Take 100 mg by mouth 2 (two) times daily.   . fluticasone (FLOVENT HFA) 220 MCG/ACT inhaler TAKE 2 PUFFS BY MOUTH TWICE A DAY (Patient taking differently: Inhale 2 puffs into the lungs 2 (two) times daily. )  . gabapentin (NEURONTIN) 300 MG capsule Take 300 mg by mouth 3 (three) times daily.   . hydrocortisone cream 1 % Apply 1 application topically 2 (two) times daily as needed for itching.   . hydrOXYzine (ATARAX/VISTARIL) 25 MG tablet Take 25 mg by mouth daily after breakfast.   . loperamide (IMODIUM A-D) 2 MG tablet Take 2-4 mg by mouth 4 (four) times daily as needed for diarrhea or loose stools.   . methocarbamol (ROBAXIN) 500 MG tablet Take 1 tablet (  500 mg total) by mouth every 6 (six) hours as needed for muscle spasms.  . mometasone (NASONEX) 50 MCG/ACT nasal spray Place 2  sprays into the nose every morning.   . montelukast (SINGULAIR) 10 MG tablet Take 1 tablet (10 mg total) by mouth at bedtime. (Patient taking differently: Take 10 mg by mouth at bedtime. )  . olopatadine (PATANOL) 0.1 % ophthalmic solution Place 1 drop into both eyes 2 (two) times daily as needed for allergies.  Marland Kitchen oxyCODONE (OXY IR/ROXICODONE) 5 MG immediate release tablet Take 1-2 tablets (5-10 mg total) by mouth every 6 (six) hours as needed for moderate pain (pain score 4-6).  Marland Kitchen primidone (MYSOLINE) 50 MG tablet Take 1 tablet (50 mg total) by mouth 2 (two) times daily.  . rivaroxaban (XARELTO) 10 MG TABS tablet Take 1 tablet (10 mg total) by mouth daily with breakfast for 19 days. Take one 10 mg xarelto once a day for three weeks following surgery to prevent blood clots.  . rizatriptan (MAXALT-MLT) 10 MG disintegrating tablet TAKE 1 TABLET EVERY DAY AS NEEDED FOR MIGRAINE, MAY REPEAT IN 2 HRS IF NEEDED. MAX 2 DOSES/WK (Patient taking differently: Take 10 mg by mouth every 2 (two) hours as needed for migraine. )  . sertraline (ZOLOFT) 100 MG tablet Take 100 mg by mouth at bedtime.   Marland Kitchen umeclidinium bromide (INCRUSE ELLIPTA) 62.5 MCG/INH AEPB Inhale 1 puff into the lungs daily.  . ziprasidone (GEODON) 40 MG capsule Take 40 mg by mouth 2 (two) times daily. Morning & with supper  . [DISCONTINUED] estradiol (ESTRACE) 0.1 MG/GM vaginal cream Place 1 Applicatorful vaginally once a week.  . [DISCONTINUED] levothyroxine (SYNTHROID, LEVOTHROID) 50 MCG tablet TAKE 1 TABLET EVERY DAY--- takes in am (Patient taking differently: Take 50 mcg by mouth daily before breakfast. )  . [DISCONTINUED] rosuvastatin (CRESTOR) 20 MG tablet TAKE 1 TABLET BY MOUTH EVERY DAY (Patient taking differently: Take 20 mg by mouth every morning. )   acetaminophen **OR** acetaminophen, albuterol, amitriptyline, cyclobenzaprine, ondansetron **OR** ondansetron (ZOFRAN) IV, oxyCODONE    Danarius Mcconathy J Ory Elting,MD [facility] 01/22/2018, 3:17PM

## 2018-01-23 LAB — BASIC METABOLIC PANEL
ANION GAP: 11 (ref 5–15)
BUN: 7 mg/dL — ABNORMAL LOW (ref 8–23)
CALCIUM: 8.9 mg/dL (ref 8.9–10.3)
CO2: 27 mmol/L (ref 22–32)
Chloride: 100 mmol/L (ref 98–111)
Creatinine, Ser: 0.59 mg/dL (ref 0.44–1.00)
GFR calc Af Amer: >60 mL/min (ref >60)
GFR calc non Af Amer: >60 mL/min (ref >60)
Glucose, Bld: 133 mg/dL — ABNORMAL HIGH (ref 70–99)
Potassium: 3.7 mmol/L (ref 3.5–5.1)
Sodium: 138 mmol/L (ref 135–145)

## 2018-01-23 NOTE — Care Management Important Message (Signed)
Important Message  Patient Details  Name: MIKYLA SCHACHTER MRN: [id number] Date of Birth: [birth year]   Medicare Important Message Given:  Yes    Kerin Salen 01/23/2018, 2:09 [place] Message  Patient Details  Name: FLORA PARKS MRN: [id number] Date of Birth: Dec 18, 1955   Medicare Important Message Given:  Yes    Kerin Salen 01/23/2018, 2:09 PM

## 2018-01-23 NOTE — Progress Notes (Signed)
Awaiting [name]. Facility is following up on auth.   LCSW will notify attending as soon as [name] is approved.   LCSW will continue to follow.   Carolin Coy [place] [facility] Person

## 2018-01-23 NOTE — Progress Notes (Signed)
PROGRESS NOTE    [name]  ZJI:[id number] DOB: [birth year] DOA: 01/16/2018 PCP: [place], Betty G, MD      Brief Narrative:  [name] is a 62 y.o. F with HTN, OSA, hypothyroidism, and Bipolar disorder on Geodon and Zoloft who presents with progressive inability to walk.  This has been a slowly progressive problem, worse in the last 1-2 weeks (patient had left TKA in mid November).  In the ER, the patient appeared dehydrated and with dysuria to EDP, started on IV fluids; to admitting phyisician was notably rigid on exam, masked facies. Pt admitted for further management.    Assessment & Plan:  Dyskinesia, rigidity Patient followed in Movement disorders clinic since 2017 for tremor.  Those notes indicate symptomatic improvement with primidone, also highlight some mild parkinsonism more recently, attributed to [facility] Neurology on board: Follow up with outpt neurologist Dr. Carles Collet PT/OT  UTI Patient with dysuria, pyuria on UA. Urine culture showing only 60,000 colonies per mL of E. Coli Completed IV Rocephin  Recent knee replacement PT evaluation  COPD RT consulted for symptomatic treatment.  Hypothyroidism Continue levothyroxine  Hypertension Started on metoprolol Not on any home meds  Bipolar disorder Stable Hold Geodon Restarted on amitriptyline as needed, on primidone Advised to follow-up with her outpatient psychiatrist to discuss other medications, pt insisting that she needs to speak to a psychiatrist while she is awaiting placement for medication adjustments Denies SI/HI Psych consult placed, rec starting PO carbamazepine and following up with outpt psych   DVT prophylaxis: Lovenox Code Status: FULL Family Communication: None present Disposition: Awaiting SNF placement  Consultants:   Neurology  Psychiatry  Procedures:   None  Antimicrobials:   Ceftriaxone 12/10 >>01/19/18   Subjective: Pt denies any new complaints   Objective: Vitals:     01/23/18 0452 01/23/18 0756 01/23/18 1353 01/23/18 1944  BP: (!) 160/83  (!) 166/92   Pulse: 92  98   Resp: 18  18   Temp: 97.7 F (36.5 C)  97.9 F (36.6 C)   TempSrc: Oral  Oral   SpO2: 100% 97%  99%  Weight:      Height:        Intake/Output Summary (Last 24 hours) at 01/23/2018 2134 Last data filed at 01/23/2018 1817 Gross per 24 hour  Intake 60 ml  Output 2050 ml  Net -1990 ml   Filed Weights   01/16/18 0115  Weight: 68.5 kg    Examination:  General: NAD, tremors noted   Cardiovascular: S1, S2 present  Respiratory: CTAB  Abdomen: Soft, nontender, nondistended, bowel sounds present  Musculoskeletal: No bilateral pedal edema noted  Skin: Normal  Psychiatry: Normal mood    Data Reviewed: I have personally reviewed following labs and imaging studies:  CBC: Recent Labs  Lab 01/18/18 0607 01/19/18 0611 01/20/18 0616 01/21/18 0633 01/22/18 0650  WBC 13.0* 12.0* 10.9* 10.3 11.9*  NEUTROABS  --   --   --   --  8.2*  HGB 11.1* 9.9* 9.7* 10.5* 10.8*  HCT 35.2* 31.9* 31.2* 34.1* 34.0*  MCV 93.1 96.4 95.7 93.9 94.2  PLT 318 228 233 278 175   Basic Metabolic Panel: Recent Labs  Lab 01/18/18 0607 01/19/18 0611 01/20/18 0616 01/21/18 0633 01/22/18 0650 01/23/18 0550  NA 140 139 139 139 140 138  K 3.4* 3.4* 3.8 3.4* 3.2* 3.7  CL 102 104 103 103 102 100  CO2 28 26 28 25 28 27   GLUCOSE 151* 158* 146* 163* 147* 133*  BUN 7* 8 9 9 8  7*  CREATININE 0.61 0.48 0.62 0.57 0.56 0.59  CALCIUM 8.6* 8.3* 8.5* 8.5* 8.9 8.9  MG 1.8  --   --   --   --   --    GFR: Estimated Creatinine Clearance: 63.8 mL/min (by C-G formula based on SCr of 0.59 mg/dL). Liver Function Tests: No results for input(s): AST, ALT, ALKPHOS, BILITOT, PROT, ALBUMIN in the last 168 hours. No results for input(s): LIPASE, AMYLASE in the last 168 hours. No results for input(s): AMMONIA in the last 168 hours. Coagulation Profile: No results for input(s): INR, PROTIME in the last 168  hours. Cardiac Enzymes: Recent Labs  Lab 01/17/18 1700  CKTOTAL 137   BNP (last 3 results) No results for input(s): PROBNP in the last 8760 hours. HbA1C: No results for input(s): HGBA1C in the last 72 hours. CBG: No results for input(s): GLUCAP in the last 168 hours. Lipid Profile: No results for input(s): CHOL, HDL, LDLCALC, TRIG, CHOLHDL, LDLDIRECT in the last 72 hours. Thyroid Function Tests: No results for input(s): TSH, T4TOTAL, FREET4, T3FREE, THYROIDAB in the last 72 hours. Anemia Panel: No results for input(s): VITAMINB12, FOLATE, FERRITIN, TIBC, IRON, RETICCTPCT in the last 72 hours. Urine analysis:    Component Value Date/Time   COLORURINE YELLOW 01/16/2018 0804   APPEARANCEUR CLOUDY (A) 01/16/2018 0804   LABSPEC 1.010 01/16/2018 0804   PHURINE 6.0 01/16/2018 0804   GLUCOSEU NEGATIVE 01/16/2018 0804   GLUCOSEU NEGATIVE 11/21/2017 1026   HGBUR SMALL (A) 01/16/2018 0804   BILIRUBINUR NEGATIVE 01/16/2018 0804   KETONESUR NEGATIVE 01/16/2018 0804   PROTEINUR NEGATIVE 01/16/2018 0804   UROBILINOGEN 0.2 11/21/2017 1026   NITRITE NEGATIVE 01/16/2018 0804   LEUKOCYTESUR LARGE (A) 01/16/2018 0804   Sepsis Labs: @LABRCNTIP (procalcitonin:4,lacticacidven:4)  ) Recent Results (from the past 240 hour(s))  Culture, Urine     Status: Abnormal   Collection Time: 01/17/18  9:10 AM  Result Value Ref Range Status   Specimen Description   Final    URINE, CLEAN CATCH Performed at [facility], [place] [address] [place], [place] [address]    Special Requests   Final    NONE Performed at [facility], [place] [address] [place], [place] [address]    Culture 60,000 COLONIES/mL ESCHERICHIA COLI (A)  Final   Report Status 01/19/2018 FINAL  Final   Organism ID, Bacteria ESCHERICHIA COLI (A)  Final      Susceptibility   Escherichia coli - MIC*    AMPICILLIN 4 SENSITIVE Sensitive     CEFAZOLIN <=4 SENSITIVE Sensitive     CEFTRIAXONE <=1  SENSITIVE Sensitive     CIPROFLOXACIN <=0.25 SENSITIVE Sensitive     GENTAMICIN <=1 SENSITIVE Sensitive     IMIPENEM <=0.25 SENSITIVE Sensitive     NITROFURANTOIN <=16 SENSITIVE Sensitive     TRIMETH/SULFA <=20 SENSITIVE Sensitive     AMPICILLIN/SULBACTAM <=2 SENSITIVE Sensitive     PIP/TAZO <=4 SENSITIVE Sensitive     Extended ESBL NEGATIVE Sensitive     * 60,000 COLONIES/mL ESCHERICHIA COLI         Radiology Studies: No results found.      Scheduled Meds: . budesonide (PULMICORT) nebulizer solution  1 mg Nebulization BID  . carbamazepine  100 mg Oral BID  . enoxaparin (LOVENOX) injection  40 mg Subcutaneous Q24H  . hydrOXYzine  25 mg Oral QPC breakfast  . levothyroxine  50 mcg Oral Q0600  . metoprolol tartrate  12.5 mg Oral BID  .  montelukast  10 mg Oral QHS  . multivitamin  1 tablet Oral Daily  . pentosan polysulfate  100 mg Oral BID  . primidone  50 mg Oral BID  . rosuvastatin  20 mg Oral q morning - 10a  . sertraline  100 mg Oral QHS  . thiamine  100 mg Oral Daily   Continuous Infusions:       . budesonide (PULMICORT) nebulizer solution  1 mg Nebulization BID  . carbamazepine  100 mg Oral BID  . enoxaparin (LOVENOX) injection  40 mg Subcutaneous Q24H  . hydrOXYzine  25 mg Oral QPC breakfast  . levothyroxine  50 mcg Oral Q0600  . metoprolol tartrate  12.5 mg Oral BID  . montelukast  10 mg Oral QHS  . multivitamin  1 tablet Oral Daily  . pentosan polysulfate  100 mg Oral BID  . primidone  50 mg Oral BID  . rosuvastatin  20 mg Oral q morning - 10a  . sertraline  100 mg Oral QHS  . thiamine  100 mg Oral Daily     Current Meds  Medication Sig  . albuterol (PROVENTIL HFA;VENTOLIN HFA) 108 (90 Base) MCG/ACT inhaler Inhale 1-2 puffs into the lungs daily as needed for wheezing or shortness of breath.  Marland Kitchen albuterol (PROVENTIL) (2.5 MG/3ML) 0.083% nebulizer solution TAKE 3 MLS BY NEBULIZATION EVERY 6 HOURS AS NEEDED FOR WHEEZING OR SHORTNESS OF BREATH. (Patient  taking differently: Take 2.5 mg by nebulization every 6 (six) hours as needed for wheezing or shortness of breath. )  . amitriptyline (ELAVIL) 10 MG tablet Take 10 mg by mouth at bedtime as needed for sleep.   Marland Kitchen anti-nausea (EMETROL) solution Take 30 mLs by mouth every 15 (fifteen) minutes as needed for nausea or vomiting.  . Azelastine HCl 0.15 % SOLN PLACE 2 SPRAYS INTO THE NOSE DAILY AS NEEDED. (Patient taking differently: Place 2 sprays into the nose daily as needed (congestion). )  . Carbinoxamine Maleate 4 MG TABS TAKE 1 TABLET BY MOUTH EVERY 8 HOURS AS NEEDED (Patient taking differently: Take 4 mg by mouth every 8 (eight) hours as needed (allergies). )  . ELMIRON 100 MG capsule Take 100 mg by mouth 2 (two) times daily.   . fluticasone (FLOVENT HFA) 220 MCG/ACT inhaler TAKE 2 PUFFS BY MOUTH TWICE A DAY (Patient taking differently: Inhale 2 puffs into the lungs 2 (two) times daily. )  . gabapentin (NEURONTIN) 300 MG capsule Take 300 mg by mouth 3 (three) times daily.   . hydrocortisone cream 1 % Apply 1 application topically 2 (two) times daily as needed for itching.   . hydrOXYzine (ATARAX/VISTARIL) 25 MG tablet Take 25 mg by mouth daily after breakfast.   . loperamide (IMODIUM A-D) 2 MG tablet Take 2-4 mg by mouth 4 (four) times daily as needed for diarrhea or loose stools.   . methocarbamol (ROBAXIN) 500 MG tablet Take 1 tablet (500 mg total) by mouth every 6 (six) hours as needed for muscle spasms.  . mometasone (NASONEX) 50 MCG/ACT nasal spray Place 2 sprays into the nose every morning.   . montelukast (SINGULAIR) 10 MG tablet Take 1 tablet (10 mg total) by mouth at bedtime. (Patient taking differently: Take 10 mg by mouth at bedtime. )  . olopatadine (PATANOL) 0.1 % ophthalmic solution Place 1 drop into both eyes 2 (two) times daily as needed for allergies.  Marland Kitchen oxyCODONE (OXY IR/ROXICODONE) 5 MG immediate release tablet Take 1-2 tablets (5-10 mg total) by mouth every 6 (  six) hours as needed  for moderate pain (pain score 4-6).  Marland Kitchen primidone (MYSOLINE) 50 MG tablet Take 1 tablet (50 mg total) by mouth 2 (two) times daily.  . rivaroxaban (XARELTO) 10 MG TABS tablet Take 1 tablet (10 mg total) by mouth daily with breakfast for 19 days. Take one 10 mg xarelto once a day for three weeks following surgery to prevent blood clots.  . rizatriptan (MAXALT-MLT) 10 MG disintegrating tablet TAKE 1 TABLET EVERY DAY AS NEEDED FOR MIGRAINE, MAY REPEAT IN 2 HRS IF NEEDED. MAX 2 DOSES/WK (Patient taking differently: Take 10 mg by mouth every 2 (two) hours as needed for migraine. )  . sertraline (ZOLOFT) 100 MG tablet Take 100 mg by mouth at bedtime.   Marland Kitchen umeclidinium bromide (INCRUSE ELLIPTA) 62.5 MCG/INH AEPB Inhale 1 puff into the lungs daily.  . ziprasidone (GEODON) 40 MG capsule Take 40 mg by mouth 2 (two) times daily. Morning & with supper  . [DISCONTINUED] alendronate (FOSAMAX) 70 MG tablet TAKE 1 TABLET BY MOUTH EVERY 7 DAYS WITH FULL GLASS OF WATER ON EMPTY STOMACH (Patient taking differently: Take 70 mg by mouth once a week. )  . [DISCONTINUED] estradiol (ESTRACE) 0.1 MG/GM vaginal cream Place 1 Applicatorful vaginally once a week.  . [DISCONTINUED] levothyroxine (SYNTHROID, LEVOTHROID) 50 MCG tablet TAKE 1 TABLET EVERY DAY--- takes in am (Patient taking differently: Take 50 mcg by mouth daily before breakfast. )  . [DISCONTINUED] rosuvastatin (CRESTOR) 20 MG tablet TAKE 1 TABLET BY MOUTH EVERY DAY (Patient taking differently: Take 20 mg by mouth every morning. )   acetaminophen **OR** acetaminophen, albuterol, amitriptyline, cyclobenzaprine, ondansetron **OR** ondansetron (ZOFRAN) IV, oxyCODONE    Rihana Kiddy J Nivia Gervase,MD [facility] 01/22/2018, 3:17PM

## 2018-01-24 ENCOUNTER — Other Ambulatory Visit: Payer: Self-pay | Admitting: Allergy and Immunology

## 2018-01-24 DIAGNOSIS — F411 Generalized anxiety disorder: Secondary | ICD-10-CM | POA: Diagnosis not present

## 2018-01-24 DIAGNOSIS — M6281 Muscle weakness (generalized): Secondary | ICD-10-CM | POA: Diagnosis not present

## 2018-01-24 DIAGNOSIS — Z96652 Presence of left artificial knee joint: Secondary | ICD-10-CM | POA: Diagnosis not present

## 2018-01-24 DIAGNOSIS — F331 Major depressive disorder, recurrent, moderate: Secondary | ICD-10-CM | POA: Diagnosis not present

## 2018-01-24 DIAGNOSIS — R69 Illness, unspecified: Secondary | ICD-10-CM | POA: Diagnosis not present

## 2018-01-24 DIAGNOSIS — M1712 Unilateral primary osteoarthritis, left knee: Secondary | ICD-10-CM | POA: Diagnosis not present

## 2018-01-24 DIAGNOSIS — R278 Other lack of coordination: Secondary | ICD-10-CM | POA: Diagnosis not present

## 2018-01-24 DIAGNOSIS — F423 Hoarding disorder: Secondary | ICD-10-CM | POA: Diagnosis not present

## 2018-01-24 DIAGNOSIS — R531 Weakness: Secondary | ICD-10-CM | POA: Diagnosis not present

## 2018-01-24 DIAGNOSIS — Z7401 Bed confinement status: Secondary | ICD-10-CM | POA: Diagnosis not present

## 2018-01-24 DIAGNOSIS — R2689 Other abnormalities of gait and mobility: Secondary | ICD-10-CM | POA: Diagnosis not present

## 2018-01-24 DIAGNOSIS — G4700 Insomnia, unspecified: Secondary | ICD-10-CM | POA: Diagnosis not present

## 2018-01-24 DIAGNOSIS — J449 Chronic obstructive pulmonary disease, unspecified: Secondary | ICD-10-CM | POA: Diagnosis not present

## 2018-01-24 DIAGNOSIS — R52 Pain, unspecified: Secondary | ICD-10-CM | POA: Diagnosis not present

## 2018-01-24 DIAGNOSIS — G2579 Other drug induced movement disorders: Secondary | ICD-10-CM | POA: Diagnosis not present

## 2018-01-24 DIAGNOSIS — G2111 Neuroleptic induced parkinsonism: Secondary | ICD-10-CM | POA: Diagnosis not present

## 2018-01-24 DIAGNOSIS — G20 Parkinson's disease: Secondary | ICD-10-CM | POA: Diagnosis not present

## 2018-01-24 DIAGNOSIS — M1711 Unilateral primary osteoarthritis, right knee: Secondary | ICD-10-CM | POA: Diagnosis not present

## 2018-01-24 DIAGNOSIS — R262 Difficulty in walking, not elsewhere classified: Secondary | ICD-10-CM | POA: Diagnosis not present

## 2018-01-24 DIAGNOSIS — F3111 Bipolar disorder, current episode manic without psychotic features, mild: Secondary | ICD-10-CM | POA: Diagnosis not present

## 2018-01-24 DIAGNOSIS — G2119 Other drug induced secondary parkinsonism: Secondary | ICD-10-CM | POA: Diagnosis not present

## 2018-01-24 DIAGNOSIS — F319 Bipolar disorder, unspecified: Secondary | ICD-10-CM | POA: Diagnosis not present

## 2018-01-24 DIAGNOSIS — I10 Essential (primary) hypertension: Secondary | ICD-10-CM | POA: Diagnosis not present

## 2018-01-24 DIAGNOSIS — R1311 Dysphagia, oral phase: Secondary | ICD-10-CM | POA: Diagnosis not present

## 2018-01-24 DIAGNOSIS — Z79899 Other long term (current) drug therapy: Secondary | ICD-10-CM | POA: Diagnosis not present

## 2018-01-24 DIAGNOSIS — R630 Anorexia: Secondary | ICD-10-CM | POA: Diagnosis not present

## 2018-01-24 DIAGNOSIS — J4520 Mild intermittent asthma, uncomplicated: Secondary | ICD-10-CM | POA: Diagnosis not present

## 2018-01-24 DIAGNOSIS — R269 Unspecified abnormalities of gait and mobility: Secondary | ICD-10-CM | POA: Diagnosis not present

## 2018-01-24 DIAGNOSIS — M2550 Pain in unspecified joint: Secondary | ICD-10-CM | POA: Diagnosis not present

## 2018-01-24 DIAGNOSIS — R251 Tremor, unspecified: Secondary | ICD-10-CM | POA: Diagnosis not present

## 2018-01-24 DIAGNOSIS — Z9181 History of falling: Secondary | ICD-10-CM | POA: Diagnosis not present

## 2018-01-24 MED ORDER — CARBAMAZEPINE ER 100 MG PO TB12: 100.0000 mg | ORAL_TABLET | Freq: Two times a day (BID) | ORAL | Status: DC

## 2018-01-24 MED ORDER — METOPROLOL TARTRATE 25 MG PO TABS: 12.5000 mg | ORAL_TABLET | Freq: Two times a day (BID) | ORAL | Status: DC

## 2018-01-24 NOTE — Progress Notes (Addendum)
Patient has discharged to [facility] on 01/24/18. Discharge instruction including medication and appointment was placed in discharge package and sent with patient. Patient has no question at this time.  Rn call Dale to give a report to [place] at 1140. No question at this time. As RN Dianne, patient can come to SNF around 1430.

## 2018-01-24 NOTE — Progress Notes (Signed)
Physical Therapy Treatment Patient Details Name: [name] MRN: [id number] DOB: [birth year] Today's Date: 01/24/2018    History of Present Illness 62 yo female admitted to ED on 12/9 for increasing total body weakness, inability to ambulate significantly over the past week. Pt with L TKR on 12/26/17. Per hospitalists' note, pt with possible serotonin syndrome, parkinsonism. Pt with PMH of bipolar disorder, pt currently off serotonin medications. Other PMH includes anxiety, claustrophobia, depression, UTI, TMJ dysfunction, migraines, OA, OSA on CPAP, PVCs, osteoporosis, obesity, hiatial hernia repair 2018, tremors, IBS.     PT Comments    The patient requires much encouragement. The patient's  Level of function is significantly changed from post status. Continue  PT.   Follow Up Recommendations  SNF;Supervision for mobility/OOB     Equipment Recommendations  None recommended by PT    Recommendations for Other Services       Precautions / Restrictions Precautions Precautions: Fall Precaution Comments: bilateral ankles are plantarflexed Restrictions Other Position/Activity Restrictions: WBAT    Mobility  Bed Mobility   Bed Mobility: Supine to Sit;Sit to Supine;Rolling Rolling: Max assist   Supine to sit: Max assist Sit to supine: Max assist   General bed mobility comments: assist with all aspects of mobility, much encourgaement, patient frequently states" I can'T"  Transfers                 General transfer comment: NT  Ambulation/Gait                 Stairs             Wheelchair Mobility    Modified Rankin (Stroke Patients Only)       Balance                                            Cognition Arousal/Alertness: Awake/alert Behavior During Therapy: Flat affect                                   General Comments: Pt with continued mask-like expression during session, very flat affect with no  indication of emotional inflection in voice. Pt with short responses during session.      Exercises Total Joint Exercises Heel Slides: AAROM;10 reps;Supine;Both Hip ABduction/ADduction: AAROM;Left;10 reps;Supine Straight Leg Raises: AAROM;Left;10 reps;Supine    General Comments        Pertinent Vitals/Pain Faces Pain Scale: Hurts whole lot Pain Location: R knee, with AAROM  Pain Descriptors / Indicators: Grimacing;Guarding;Sore Pain Intervention(s): Monitored during session;Premedicated before session    Home Living                      Prior Function            PT Goals (current goals can now be found in the care plan section) Progress towards PT goals: Progressing toward goals    Frequency    Min 2X/week      PT Plan Current plan remains appropriate    Co-evaluation              AM-PAC PT "6 Clicks" Mobility   Outcome Measure  Help needed turning from your back to your side while in a flat bed without using bedrails?: Total Help needed moving from lying on your back to sitting on  the side of a flat bed without using bedrails?: Total Help needed moving to and from a bed to a chair (including a wheelchair)?: Total Help needed standing up from a chair using your arms (e.g., wheelchair or bedside chair)?: Total Help needed to walk in hospital room?: Total Help needed climbing 3-5 steps with a railing? : Total 6 Click Score: 6    End of Session   Activity Tolerance: Patient limited by pain;Patient limited by fatigue;Other (comment) Patient left: in bed;with bed alarm set;with call bell/phone within reach Nurse Communication: Mobility status PT Visit Diagnosis: Muscle weakness (generalized) (M62.81);Other abnormalities of gait and mobility (R26.89) Pain - Right/Left: Left Pain - part of body: Knee     Time: 2671-2458 PT Time Calculation (min) (ACUTE ONLY): 30 min  Charges:  $Therapeutic Activity: 23-37 mins                     Tresa Endo  PT Acute Rehabilitation Services Pager [phone] Office [phone]    Claretha Cooper 01/24/2018, 1:48 PM

## 2018-01-24 NOTE — Discharge Summary (Signed)
Discharge Summary  [name] HQI:[id number] DOB: [birth year]  PCP: Martinique, Betty G, MD  Admit date: 01/16/2018 Discharge date: 01/24/2018  Time spent: 40 mins  Recommendations for Outpatient Follow-up:  1. PCP in 1 week 2. Neurology 3. Psychiatry   Discharge Diagnoses:  Active Hospital Problems   Diagnosis Date Noted  . Bipolar affective ([place]) 10/07/2006  . Ambulatory dysfunction 01/16/2018  . Serotonin syndrome 01/16/2018  . Bipolar 1 disorder, mixed, moderate ([place]) 01/29/2016    Resolved Hospital Problems  No resolved problems to display.    Discharge Condition: Stable   Diet recommendation: Heart healthy  Vitals:   01/24/18 0858 01/24/18 1000  BP:  140/80  Pulse:  92  Resp:    Temp:    SpO2: 97%     History of present illness:  [name] is a 62 y.o. F with HTN, OSA, hypothyroidism, and Bipolar disorder on Geodon and Zoloft who presents with progressive inability to walk.  This has been a slowly progressive problem, worse in the last 1-2 weeks (patient had left TKA in mid November).  In the ER, the patient appeared dehydrated and with dysuria to EDP, started on IV fluids; to admitting phyisician was notably rigid on exam, masked facies. Pt admitted for further management.   Today, pt denies any new complaints. Eager to be discharged to continue rehab.  Hospital Course:  Principal Problem:   Bipolar affective ([place]) Active Problems:   Bipolar 1 disorder, mixed, moderate (H[facility])   Ambulatory dysfunction   Serotonin syndrome  Dyskinesia, rigidity Patient followed in Movement disorders clinic since 2017 for tremor.  Those notes indicate symptomatic improvement with primidone, also highlight some mild parkinsonism more recently, attributed to [facility] Neurology on board: Follow up with outpt neurologist Dr. Carles Collet PT/OT  UTI Patient with dysuria, pyuria on UA. Urine culture showing only 60,000 colonies per mL of E. Coli Completed IV Rocephin  Recent  knee replacement PT evaluation  COPD Stable  Hypothyroidism Continue levothyroxine  Hypertension Started on metoprolol, may adjust accordingly  Not on any home meds  Bipolar disorder Stable Discontinue Geodon Restarted on amitriptyline as needed, on primidone Psych consult placed, rec starting PO carbamazepine and following up with outpt psych Advised to follow-up with her outpatient psychiatrist to discuss other medications Denies SI/HI        Malnutrition Type:      Malnutrition Characteristics:      Nutrition Interventions:      Estimated body mass index is 29.49 kg/m as calculated from the following:   Height as of this encounter: 5' (1.524 m).   Weight as of this encounter: 68.5 kg.    Procedures:  NOne  Consultations:  Neurology   Psychiatry    Discharge Exam: BP 140/80 (BP Location: Left Arm)   Pulse 92   Temp 97.7 F (36.5 C) (Oral)   Resp 18   Ht 5' (1.524 m)   Wt 68.5 kg   LMP 03/05/2012 (Approximate)   SpO2 97%   BMI 29.49 kg/m   General: NAD  Cardiovascular: S1, S2 present Respiratory: CTAB  Discharge Instructions You were cared for by a hospitalist during your hospital stay. If you have any questions about your discharge medications or the care you received while you were in the hospital after you are discharged, you can call the unit and asked to speak with the hospitalist on call if the hospitalist that took care of you is not available. Once you are discharged, your primary care physician will  handle any further medical issues. Please note that NO REFILLS for any discharge medications will be authorized once you are discharged, as it is imperative that you return to your primary care physician (or establish a relationship with a primary care physician if you do not have one) for your aftercare needs so that they can reassess your need for medications and monitor your lab values.   Allergies as of 01/24/2018       Reactions   Indomethacin Other (See Comments)   Muscle spasms Causes muscle spasms in neck   Amlodipine Swelling   Peripheral edema   Pseudoephedrine Other (See Comments)   I fly off the walls  CANNOT SLEEP   Risperidone And Related Other (See Comments)   Stomach upset, insomnia, drooling, tremors, "jerks," sensitivity to touch.    Bupivacaine Hives   Pentazocine Other (See Comments)   Unknown reaction MADE ME "LACTATE"   Propoxyphene Itching, Nausea And Vomiting   darvocet   Sulfa Antibiotics Other (See Comments)   Unknown reaction   Tramadol Other (See Comments)   Patient can not remember   Aspirin Other (See Comments)   upset stomach, ringing in the ears   Codeine Itching, Other (See Comments)   Anything related to codeine   Etodolac Rash, Other (See Comments)   Ivp Dye [iodinated Diagnostic Agents] Itching   Flushed and Fever, itch all over   Propranolol Nausea Only, Other (See Comments)      Medication List    STOP taking these medications   oxyCODONE 5 MG immediate release tablet Commonly known as:  Oxy IR/ROXICODONE   rivaroxaban 10 MG Tabs tablet Commonly known as:  XARELTO   Tiotropium Bromide Monohydrate 1.25 MCG/ACT Aers Commonly known as:  SPIRIVA RESPIMAT   ziprasidone 40 MG capsule Commonly known as:  GEODON   Zoster Vaccine Adjuvanted injection Commonly known as:  SHINGRIX     TAKE these medications   albuterol (2.5 MG/3ML) 0.083% nebulizer solution Commonly known as:  PROVENTIL TAKE 3 MLS BY NEBULIZATION EVERY 6 HOURS AS NEEDED FOR WHEEZING OR SHORTNESS OF BREATH. What changed:  See the new instructions.   albuterol 108 (90 Base) MCG/ACT inhaler Commonly known as:  PROVENTIL HFA;VENTOLIN HFA INHALE 1-2 PUFFS INTO THE LUNGS DAILY AS NEEDED FOR WHEEZING OR SHORTNESS OF BREATH. What changed:  Another medication with the same name was changed. Make sure you understand how and when to take each.   alendronate 70 MG tablet Commonly known as:   FOSAMAX TAKE 1 TABLET BY MOUTH EVERY 7 DAYS WITH FULL GLASS OF WATER ON EMPTY STOMACH What changed:  See the new instructions.   amitriptyline 10 MG tablet Commonly known as:  ELAVIL Take 10 mg by mouth at bedtime as needed for sleep.   anti-nausea solution Take 30 mLs by mouth every 15 (fifteen) minutes as needed for nausea or vomiting.   Azelastine HCl 0.15 % Soln PLACE 2 SPRAYS INTO THE NOSE DAILY AS NEEDED. What changed:  reasons to take this   carbamazepine 100 MG 12 hr tablet Commonly known as:  TEGRETOL XR Take 1 tablet (100 mg total) by mouth 2 (two) times daily.   Carbinoxamine Maleate 4 MG Tabs TAKE 1 TABLET BY MOUTH EVERY 8 HOURS AS NEEDED What changed:  reasons to take this   ELMIRON 100 MG capsule Generic drug:  pentosan polysulfate Take 100 mg by mouth 2 (two) times daily.   estradiol 0.1 MG/GM vaginal cream Commonly known as:  ESTRACE PLACE 1 APPLICATORFUL  VAGINALLY ONCE A WEEK.   fluticasone 220 MCG/ACT inhaler Commonly known as:  FLOVENT HFA TAKE 2 PUFFS BY MOUTH TWICE A DAY What changed:    how much to take  how to take this  when to take this  additional instructions   gabapentin 300 MG capsule Commonly known as:  NEURONTIN Take 300 mg by mouth 3 (three) times daily.   hydrocortisone cream 1 % Apply 1 application topically 2 (two) times daily as needed for itching.   hydrOXYzine 25 MG tablet Commonly known as:  ATARAX/VISTARIL Take 25 mg by mouth daily after breakfast.   ketoconazole 2 % shampoo Commonly known as:  NIZORAL Apply 1 application topically 2 (two) times a week.   levothyroxine 50 MCG tablet Commonly known as:  SYNTHROID, LEVOTHROID TAKE 1 TABLET EVERY DAY--- TAKES IN AM What changed:  See the new instructions.   loperamide 2 MG tablet Commonly known as:  IMODIUM A-D Take 2-4 mg by mouth 4 (four) times daily as needed for diarrhea or loose stools.   methocarbamol 500 MG tablet Commonly known as:  ROBAXIN Take 1  tablet (500 mg total) by mouth every 6 (six) hours as needed for muscle spasms.   metoprolol tartrate 25 MG tablet Commonly known as:  LOPRESSOR Take 0.5 tablets (12.5 mg total) by mouth 2 (two) times daily.   mometasone 50 MCG/ACT nasal spray Commonly known as:  NASONEX Place 2 sprays into the nose every morning.   montelukast 10 MG tablet Commonly known as:  SINGULAIR Take 1 tablet (10 mg total) by mouth at bedtime.   olopatadine 0.1 % ophthalmic solution Commonly known as:  PATANOL Place 1 drop into both eyes 2 (two) times daily as needed for allergies.   primidone 50 MG tablet Commonly known as:  MYSOLINE Take 1 tablet (50 mg total) by mouth 2 (two) times daily.   rizatriptan 10 MG disintegrating tablet Commonly known as:  MAXALT-MLT TAKE 1 TABLET EVERY DAY AS NEEDED FOR MIGRAINE, MAY REPEAT IN 2 HRS IF NEEDED. MAX 2 DOSES/WK What changed:  See the new instructions.   rosuvastatin 20 MG tablet Commonly known as:  CRESTOR TAKE 1 TABLET BY MOUTH EVERY DAY What changed:  when to take this   sertraline 100 MG tablet Commonly known as:  ZOLOFT Take 100 mg by mouth at bedtime.   umeclidinium bromide 62.5 MCG/INH Aepb Commonly known as:  INCRUSE ELLIPTA Inhale 1 puff into the lungs daily.      Allergies  Allergen Reactions  . Indomethacin Other (See Comments)    Muscle spasms Causes muscle spasms in neck  . Amlodipine Swelling    Peripheral edema  . Pseudoephedrine Other (See Comments)    I fly off the walls  CANNOT SLEEP  . Risperidone And Related Other (See Comments)    Stomach upset, insomnia, drooling, tremors, "jerks," sensitivity to touch.   . Bupivacaine Hives  . Pentazocine Other (See Comments)    Unknown reaction MADE ME "LACTATE"  . Propoxyphene Itching and Nausea And Vomiting    darvocet  . Sulfa Antibiotics Other (See Comments)    Unknown reaction  . Tramadol Other (See Comments)    Patient can not remember  . Aspirin Other (See Comments)     upset stomach, ringing in the ears  . Codeine Itching and Other (See Comments)    Anything related to codeine  . Etodolac Rash and Other (See Comments)  . Ivp Dye [Iodinated Diagnostic Agents] Itching    Flushed  and Fever, itch all over  . Propranolol Nausea Only and Other (See Comments)    Contact information for follow-up providers    Martinique, Betty G, MD. Schedule an appointment as soon as possible for a visit in 1 week(s).   Specialty:  Family Medicine Contact information: [place] [address] [phone]        Martinique, Peter M, MD .   Specialty:  Cardiology Contact information: [address] [place] [address] [phone]            Contact information for after-discharge care    Destination    [facility]erred [facility] .   Service:  Skilled Nursing Contact information: [place] [phone]                   The results of significant diagnostics from this hospitalization (including imaging, microbiology, ancillary and laboratory) are listed below for reference.    Significant Diagnostic Studies: Dg Chest Portable 1 View  Result Date: 01/16/2018 CLINICAL DATA:  Initial evaluation for acute cough, weakness. EXAM: PORTABLE CHEST 1 VIEW COMPARISON:  None available. FINDINGS: Cardiac and mediastinal silhouettes within normal limits. Lungs normally inflated. Mild left basilar subsegmental atelectasis. No focal infiltrates. No edema or effusion. No pneumothorax. Osseous structures within normal limits. IMPRESSION: Mild left basilar subsegmental atelectasis. No other active cardiopulmonary disease. Electronically Signed   By: Jeannine Boga M.D.   On: 01/16/2018 04:13    Microbiology: Recent Results (from the past 240 hour(s))  Culture, Urine     Status: Abnormal   Collection Time: 01/17/18  9:10 AM  Result Value Ref Range Status   Specimen Description   Final     URINE, CLEAN CATCH Performed at [facility], [place] [address], [place], [place] [address]    Special Requests   Final    NONE Performed at [facility], [place] [address] [place], [place] [address]    Culture 60,000 COLONIES/mL ESCHERICHIA COLI (A)  Final   Report Status 01/19/2018 FINAL  Final   Organism ID, Bacteria ESCHERICHIA COLI (A)  Final      Susceptibility   Escherichia coli - MIC*    AMPICILLIN 4 SENSITIVE Sensitive     CEFAZOLIN <=4 SENSITIVE Sensitive     CEFTRIAXONE <=1 SENSITIVE Sensitive     CIPROFLOXACIN <=0.25 SENSITIVE Sensitive     GENTAMICIN <=1 SENSITIVE Sensitive     IMIPENEM <=0.25 SENSITIVE Sensitive     NITROFURANTOIN <=16 SENSITIVE Sensitive     TRIMETH/SULFA <=20 SENSITIVE Sensitive     AMPICILLIN/SULBACTAM <=2 SENSITIVE Sensitive     PIP/TAZO <=4 SENSITIVE Sensitive     Extended ESBL NEGATIVE Sensitive     * 60,000 COLONIES/mL ESCHERICHIA COLI     Labs: Basic Metabolic Panel: Recent Labs  Lab 01/18/18 0607 01/19/18 0611 01/20/18 0616 01/21/18 0633 01/22/18 0650 01/23/18 0550  NA 140 139 139 139 140 138  K 3.4* 3.4* 3.8 3.4* 3.2* 3.7  CL 102 104 103 103 102 100  CO2 28 26 28 25 28 27   GLUCOSE 151* 158* 146* 163* 147* 133*  BUN 7* 8 9 9 8  7*  CREATININE 0.61 0.48 0.62 0.57 0.56 0.59  CALCIUM 8.6* 8.3* 8.5* 8.5* 8.9 8.9  MG 1.8  --   --   --   --   --    Liver Function Tests: No results for input(s): AST, ALT, ALKPHOS, BILITOT, PROT, ALBUMIN in  the last 168 hours. No results for input(s): LIPASE, AMYLASE in the last 168 hours. No results for input(s): AMMONIA in the last 168 hours. CBC: Recent Labs  Lab 01/18/18 0607 01/19/18 0611 01/20/18 0616 01/21/18 0633 01/22/18 0650  WBC 13.0* 12.0* 10.9* 10.3 11.9*  NEUTROABS  --   --   --   --  8.2*  HGB 11.1* 9.9* 9.7* 10.5* 10.8*  HCT 35.2* 31.9* 31.2* 34.1* 34.0*  MCV 93.1 96.4 95.7 93.9 94.2  PLT 318 228 233 278 283   Cardiac Enzymes: Recent Labs    Lab 01/17/18 1700  CKTOTAL 137   BNP: BNP (last 3 results) No results for input(s): BNP in the last 8760 hours.  ProBNP (last 3 results) No results for input(s): PROBNP in the last 8760 hours.  CBG: No results for input(s): GLUCAP in the last 168 hours.     Signed:  Alma Friendly, MD [facility] 01/24/2018, 11:21 AM

## 2018-01-24 NOTE — Clinical Social Work Placement (Signed)
    10:24 AM Patient has bed at [facility].  LCSW confirmed bed and auth with facility.   Patient will transport by EMS.   LCSW will fax dc docs to facility when available.   RN report #[phone]  RN please call PTAR when dc summary available and patient is ready to transport [place]  NOTE  Date:  01/24/2018  Patient Details  Name: [name] MRN: [id number] Date of Birth: 1955-03-06  Clinical Social Work is seeking post-discharge placement for this patient at the [place] level of care (*CSW will initial, date and re-position this form in  chart as items are completed):  Yes   Patient/family provided with [facility] Work Department's list of facilities offering this level of care within the geographic area requested by the patient (or if unable, by the patient's family).  Yes   Patient/family informed of their freedom to choose among providers that offer the needed level of care, that participate in Medicare, Medicaid or managed care program needed by the patient, have an available bed and are willing to accept the patient.  Yes   Patient/family informed of [facility]'s ownership interest in [facility] and [facility], as well as of the fact that they are under no obligation to receive care at these facilities.  PASRR submitted to EDS on       PASRR number received on 01/17/18     Existing PASRR number confirmed on       FL2 transmitted to all facilities in geographic area requested by pt/family on 01/17/18     FL2 transmitted to all facilities within larger geographic area on       Patient informed that his/her managed care company has contracts with or will negotiate with certain facilities, including the following:        Yes   Patient/family informed of bed offers received.  Patient [facility] bed at [facility]     Physician recommends and patient chooses bed at       Patient to be transferred to [facility] on 01/24/18.  Patient to be transferred to facility by EMS     Patient family notified on   of transfer.  Name of family member notified:  (Patietn to notify supports)     PHYSICIAN Please prepare priority discharge summary, including medications     Additional Comment:    _______________________________________________ Servando Snare, LCSW 01/24/2018, 10:24 AM

## 2018-01-25 ENCOUNTER — Telehealth: Payer: Self-pay | Admitting: Family Medicine

## 2018-01-25 DIAGNOSIS — Z9181 History of falling: Secondary | ICD-10-CM | POA: Diagnosis not present

## 2018-01-25 DIAGNOSIS — R69 Illness, unspecified: Secondary | ICD-10-CM | POA: Diagnosis not present

## 2018-01-25 DIAGNOSIS — G2579 Other drug induced movement disorders: Secondary | ICD-10-CM | POA: Diagnosis not present

## 2018-01-25 DIAGNOSIS — R531 Weakness: Secondary | ICD-10-CM | POA: Diagnosis not present

## 2018-01-25 NOTE — Telephone Encounter (Signed)
Unable to reach patient at time of TCM Call.  Left message for patient to return call when available.  Admit date: 01/16/2018 Discharge date: 01/24/2018  Time spent: 40 mins  Recommendations for Outpatient Follow-up:  1. PCP in 1 week 2. Neurology 3. Psychiatry   Discharge Diagnoses:      Active Hospital Problems   Diagnosis Date Noted  . Bipolar affective ([place]) 10/07/2006  . Ambulatory dysfunction 01/16/2018  . Serotonin syndrome 01/16/2018  . Bipolar 1 disorder, mixed, moderate ([place]) 01/29/2016    Resolved Hospital Problems  No resolved problems to display.    Discharge Condition: Stable   Diet recommendation: Heart healthy

## 2018-01-26 NOTE — Telephone Encounter (Signed)
Unable to reach patient at time of TCM Call. Left message for patient to return call when available.  

## 2018-01-27 DIAGNOSIS — I10 Essential (primary) hypertension: Secondary | ICD-10-CM | POA: Diagnosis not present

## 2018-01-27 DIAGNOSIS — R52 Pain, unspecified: Secondary | ICD-10-CM | POA: Diagnosis not present

## 2018-01-27 DIAGNOSIS — Z96652 Presence of left artificial knee joint: Secondary | ICD-10-CM | POA: Diagnosis not present

## 2018-01-29 DIAGNOSIS — G2579 Other drug induced movement disorders: Secondary | ICD-10-CM | POA: Diagnosis not present

## 2018-01-29 DIAGNOSIS — R531 Weakness: Secondary | ICD-10-CM | POA: Diagnosis not present

## 2018-01-29 DIAGNOSIS — R69 Illness, unspecified: Secondary | ICD-10-CM | POA: Diagnosis not present

## 2018-01-29 DIAGNOSIS — Z9181 History of falling: Secondary | ICD-10-CM | POA: Diagnosis not present

## 2018-01-30 ENCOUNTER — Ambulatory Visit: Payer: Self-pay | Admitting: Family Medicine

## 2018-02-03 DIAGNOSIS — R531 Weakness: Secondary | ICD-10-CM | POA: Diagnosis not present

## 2018-02-03 DIAGNOSIS — Z9181 History of falling: Secondary | ICD-10-CM | POA: Diagnosis not present

## 2018-02-03 DIAGNOSIS — R69 Illness, unspecified: Secondary | ICD-10-CM | POA: Diagnosis not present

## 2018-02-03 DIAGNOSIS — R630 Anorexia: Secondary | ICD-10-CM | POA: Diagnosis not present

## 2018-02-07 NOTE — Telephone Encounter (Signed)
Transition Care Management Follow-up Telephone Call   Date discharged? 01/24/18   How have you been since you were released from the hospital? "I'm in rehab. I'm having trouble walking."   Do you understand why you were in the hospital? yes   Do you understand the discharge instructions? yes   Where were you discharged to? Rehab   Items Reviewed:  Medications reviewed: yes  Allergies reviewed: yes  Dietary changes reviewed: yes  Referrals reviewed: yes   Functional Questionnaire:   Activities of Daily Living (ADLs):   She states they are independent in the following: ambulation States they require assistance with the following: bathing and hygiene, feeding, continence, grooming, toileting and dressing  Any transportation issues/concerns?: yes  Any patient concerns? no   Confirmed importance and date/time of follow-up visits scheduled yes, Patient unable to schedule an appointment due to lack of transportation.  Provider Appointment booked with   Confirmed with patient if condition begins to worsen call PCP or go to the ER.  Patient was given the office number and encouraged to call back with question or concerns.  : yes

## 2018-02-08 DIAGNOSIS — E038 Other specified hypothyroidism: Secondary | ICD-10-CM | POA: Diagnosis not present

## 2018-02-08 DIAGNOSIS — R748 Abnormal levels of other serum enzymes: Secondary | ICD-10-CM | POA: Diagnosis not present

## 2018-02-08 DIAGNOSIS — M1712 Unilateral primary osteoarthritis, left knee: Secondary | ICD-10-CM | POA: Diagnosis not present

## 2018-02-08 DIAGNOSIS — R2689 Other abnormalities of gait and mobility: Secondary | ICD-10-CM | POA: Diagnosis not present

## 2018-02-08 DIAGNOSIS — J984 Other disorders of lung: Secondary | ICD-10-CM | POA: Diagnosis not present

## 2018-02-08 DIAGNOSIS — R251 Tremor, unspecified: Secondary | ICD-10-CM | POA: Diagnosis not present

## 2018-02-08 DIAGNOSIS — R69 Illness, unspecified: Secondary | ICD-10-CM | POA: Diagnosis not present

## 2018-02-08 DIAGNOSIS — Z96652 Presence of left artificial knee joint: Secondary | ICD-10-CM | POA: Diagnosis not present

## 2018-02-08 DIAGNOSIS — G2119 Other drug induced secondary parkinsonism: Secondary | ICD-10-CM | POA: Diagnosis not present

## 2018-02-08 DIAGNOSIS — R278 Other lack of coordination: Secondary | ICD-10-CM | POA: Diagnosis not present

## 2018-02-08 DIAGNOSIS — I493 Ventricular premature depolarization: Secondary | ICD-10-CM | POA: Diagnosis not present

## 2018-02-08 DIAGNOSIS — R739 Hyperglycemia, unspecified: Secondary | ICD-10-CM | POA: Diagnosis not present

## 2018-02-08 DIAGNOSIS — R1311 Dysphagia, oral phase: Secondary | ICD-10-CM | POA: Diagnosis not present

## 2018-02-08 DIAGNOSIS — R269 Unspecified abnormalities of gait and mobility: Secondary | ICD-10-CM | POA: Diagnosis not present

## 2018-02-08 DIAGNOSIS — Z471 Aftercare following joint replacement surgery: Secondary | ICD-10-CM | POA: Diagnosis not present

## 2018-02-08 DIAGNOSIS — G2111 Neuroleptic induced parkinsonism: Secondary | ICD-10-CM | POA: Diagnosis not present

## 2018-02-08 DIAGNOSIS — I10 Essential (primary) hypertension: Secondary | ICD-10-CM | POA: Diagnosis not present

## 2018-02-08 DIAGNOSIS — M1711 Unilateral primary osteoarthritis, right knee: Secondary | ICD-10-CM | POA: Diagnosis not present

## 2018-02-08 DIAGNOSIS — F411 Generalized anxiety disorder: Secondary | ICD-10-CM | POA: Diagnosis not present

## 2018-02-08 DIAGNOSIS — M6281 Muscle weakness (generalized): Secondary | ICD-10-CM | POA: Diagnosis not present

## 2018-02-08 DIAGNOSIS — J449 Chronic obstructive pulmonary disease, unspecified: Secondary | ICD-10-CM | POA: Diagnosis not present

## 2018-02-08 DIAGNOSIS — J4520 Mild intermittent asthma, uncomplicated: Secondary | ICD-10-CM | POA: Diagnosis not present

## 2018-02-08 DIAGNOSIS — F331 Major depressive disorder, recurrent, moderate: Secondary | ICD-10-CM | POA: Diagnosis not present

## 2018-02-08 DIAGNOSIS — F423 Hoarding disorder: Secondary | ICD-10-CM | POA: Diagnosis not present

## 2018-02-14 DIAGNOSIS — Z96652 Presence of left artificial knee joint: Secondary | ICD-10-CM | POA: Diagnosis not present

## 2018-02-14 DIAGNOSIS — Z471 Aftercare following joint replacement surgery: Secondary | ICD-10-CM | POA: Diagnosis not present

## 2018-02-15 DIAGNOSIS — R69 Illness, unspecified: Secondary | ICD-10-CM | POA: Diagnosis not present

## 2018-02-15 DIAGNOSIS — I10 Essential (primary) hypertension: Secondary | ICD-10-CM | POA: Diagnosis not present

## 2018-02-15 DIAGNOSIS — J984 Other disorders of lung: Secondary | ICD-10-CM | POA: Diagnosis not present

## 2018-02-15 DIAGNOSIS — E038 Other specified hypothyroidism: Secondary | ICD-10-CM | POA: Diagnosis not present

## 2018-02-17 DIAGNOSIS — E038 Other specified hypothyroidism: Secondary | ICD-10-CM | POA: Diagnosis not present

## 2018-02-17 DIAGNOSIS — R69 Illness, unspecified: Secondary | ICD-10-CM | POA: Diagnosis not present

## 2018-02-17 DIAGNOSIS — I10 Essential (primary) hypertension: Secondary | ICD-10-CM | POA: Diagnosis not present

## 2018-02-17 DIAGNOSIS — J984 Other disorders of lung: Secondary | ICD-10-CM | POA: Diagnosis not present

## 2018-02-19 ENCOUNTER — Other Ambulatory Visit: Payer: Self-pay | Admitting: Family Medicine

## 2018-02-19 DIAGNOSIS — E559 Vitamin D deficiency, unspecified: Secondary | ICD-10-CM

## 2018-02-20 DIAGNOSIS — R278 Other lack of coordination: Secondary | ICD-10-CM | POA: Diagnosis not present

## 2018-02-20 DIAGNOSIS — J449 Chronic obstructive pulmonary disease, unspecified: Secondary | ICD-10-CM | POA: Diagnosis not present

## 2018-02-20 DIAGNOSIS — F411 Generalized anxiety disorder: Secondary | ICD-10-CM | POA: Diagnosis not present

## 2018-02-20 DIAGNOSIS — I10 Essential (primary) hypertension: Secondary | ICD-10-CM | POA: Diagnosis not present

## 2018-02-20 DIAGNOSIS — G2119 Other drug induced secondary parkinsonism: Secondary | ICD-10-CM | POA: Diagnosis not present

## 2018-02-20 DIAGNOSIS — F331 Major depressive disorder, recurrent, moderate: Secondary | ICD-10-CM | POA: Diagnosis not present

## 2018-02-20 DIAGNOSIS — M6281 Muscle weakness (generalized): Secondary | ICD-10-CM | POA: Diagnosis not present

## 2018-02-20 DIAGNOSIS — R2689 Other abnormalities of gait and mobility: Secondary | ICD-10-CM | POA: Diagnosis not present

## 2018-02-20 DIAGNOSIS — F423 Hoarding disorder: Secondary | ICD-10-CM | POA: Diagnosis not present

## 2018-02-20 DIAGNOSIS — R1311 Dysphagia, oral phase: Secondary | ICD-10-CM | POA: Diagnosis not present

## 2018-02-20 DIAGNOSIS — G2111 Neuroleptic induced parkinsonism: Secondary | ICD-10-CM | POA: Diagnosis not present

## 2018-02-20 DIAGNOSIS — R69 Illness, unspecified: Secondary | ICD-10-CM | POA: Diagnosis not present

## 2018-02-20 DIAGNOSIS — I493 Ventricular premature depolarization: Secondary | ICD-10-CM | POA: Diagnosis not present

## 2018-02-20 DIAGNOSIS — R251 Tremor, unspecified: Secondary | ICD-10-CM | POA: Diagnosis not present

## 2018-02-20 DIAGNOSIS — R748 Abnormal levels of other serum enzymes: Secondary | ICD-10-CM | POA: Diagnosis not present

## 2018-02-20 DIAGNOSIS — J4520 Mild intermittent asthma, uncomplicated: Secondary | ICD-10-CM | POA: Diagnosis not present

## 2018-02-20 DIAGNOSIS — R739 Hyperglycemia, unspecified: Secondary | ICD-10-CM | POA: Diagnosis not present

## 2018-02-20 DIAGNOSIS — R269 Unspecified abnormalities of gait and mobility: Secondary | ICD-10-CM | POA: Diagnosis not present

## 2018-02-20 DIAGNOSIS — M1712 Unilateral primary osteoarthritis, left knee: Secondary | ICD-10-CM | POA: Diagnosis not present

## 2018-02-20 DIAGNOSIS — M1711 Unilateral primary osteoarthritis, right knee: Secondary | ICD-10-CM | POA: Diagnosis not present

## 2018-02-21 NOTE — Progress Notes (Signed)
Subjective:   [name] was seen in consultation in the movement disorder clinic at the request of [place], Malka So, MD.  The evaluation is for tremor.  The patient is a 63 y.o. left handed female with a history of tremor.  Pt reports that tremor started many years ago; she recalls that intermittently throughout her life she would be tremulous when she was stressed or singing in crowds or doing pageants as a child.  However, since she has gotten older it has gotten worse and even if she is the "least bit anxious" she will be tremulous and it may last for days.  She states that some days she will be more tremulous more than others.  There is no known family hx of tremor (thinks that sister may but she doesn't know).  States that her paternal GM had PD.  She was admitted to psychiatry at the end of 2013-2014 to behavioral health and c/o tremor.  She was told that her tremor was due to stress/psychogenic according to the patient.  I cannot find notes about this but did find notes about tremor.  Affected by caffeine:  Unknown (rarely drinks caffeine) Affected by alcohol:  Rarely drinks Affected by stress:  Yes.   Affected by fatigue:  No., not unless very fatigued Spills soup if on spoon:  Yes.   (and has trouble eating peas) Spills glass of liquid if full:  Yes.  , if glass is too full Affects ADL's (tying shoes, brushing teeth, etc):  No., although will note tremor with zipping zippers and with measuring food when baking)  Current/Previously tried tremor medications: primidone (put on it by PA at [facility] per the patient - helped sometimes but not all the time - ran out of the medication and so is now off of it)  Current medications that may exacerbate tremor:  Lithium/VPA/geodon/albuterol  (albuterol will make her tremor so doesn't use it often; been on lithium since 1987; been on geodon for 3 months for hallucinations - visual and auditory)  07/08/17 update: Patient is  seen today in follow-up.  I have not seen her since 2017.  At that point, she was on multiple medications that can exacerbate tremor.  She was started on propranolol as needed.  I did ask her to follow-up with me in 6 months, but she did not return until today.  Records are reviewed.  She is off of lithium, but she is still on Geodon. Didn't notice a difference when getting off of lithium.  She states that the tremors interfere with her doing things.  She can awaken with them.  She believes she had "a problem"  with propranolol but cannot recall what the SE was.  Trouble eating, writing.   On primidone years ago and it seemed to help some, but when she ran out of it she did not take it any longer.  She recently saw the nurse practitioner at the pulmonary practice and was noncompliant with CPAP at the time.  11/08/17 update: Patient follows up today for tremor.  She was restarted back on primidone, 50 mg twice per day last visit.  She reports that she is doing much better; "I am so relieved."  Remains on Geodon.  Records are reviewed since our last visit.  She is scheduled for knee surgery on December 26, 2017.  Had cat bite and scratch her.  Saw PCP yesterday and on abx now.  02/23/18 update: Patient is seen back much  earlier than follow-up than anticipated.  This patient is accompanied in the office by her sister who supplements the history.  She is seen now for posthospitalization follow-up for weakness.  She was first in the hospital in November for a knee replacement.  She was discharged on November 21.  She returned right back to the hospital on December 9 because of trouble walking and weakness.  She was seen by neurology and psychiatry.  Discharge diagnoses included bipolar disorder as well as serotonin syndrome (felt less likely).  When I last saw her, I thought she was mildly parkinsonian from [place].  That Geodon was discontinued 1 week prior to going in the hospital because of the concerns about  parkinsonism (pt doesn't recall this at all).  She is supposed to be on primidone 50 mg bid, but reports she ran out of it but cannot tell me when.  She [facility] and stayed about 21 days.  She did well with that.  She is "weaning herself" off of the walker.   Sister states that her upper body got better faster than the lower body.  The records that were made available to me were reviewed (took 35 min in addition to speaking to PA neurohosp on the phone about the patient).  Outside reports reviewed: historical medical records, lab reports and referral letter/letters.  Allergies  Allergen Reactions  . Indomethacin Other (See Comments)    Muscle spasms Causes muscle spasms in neck  . Amlodipine Swelling    Peripheral edema  . Pseudoephedrine Other (See Comments)    I fly off the walls  CANNOT SLEEP  . Risperidone And Related Other (See Comments)    Stomach upset, insomnia, drooling, tremors, "jerks," sensitivity to touch.   . Bupivacaine Hives  . Pentazocine Other (See Comments)    Unknown reaction MADE ME "LACTATE"  . Propoxyphene Itching and Nausea And Vomiting    darvocet  . Sulfa Antibiotics Other (See Comments)    Unknown reaction  . Tramadol Other (See Comments)    Patient can not remember  . Aspirin Other (See Comments)    upset stomach, ringing in the ears  . Codeine Itching and Other (See Comments)    Anything related to codeine  . Etodolac Rash and Other (See Comments)  . Ivp Dye [Iodinated Diagnostic Agents] Itching    Flushed and Fever, itch all over  . Propranolol Nausea Only and Other (See Comments)    Outpatient Encounter Medications as of 02/23/2018  Medication Sig  . albuterol (PROVENTIL HFA;VENTOLIN HFA) 108 (90 Base) MCG/ACT inhaler INHALE 1-2 PUFFS INTO THE LUNGS DAILY AS NEEDED FOR WHEEZING OR SHORTNESS OF BREATH.  Marland Kitchen albuterol (PROVENTIL) (2.5 MG/3ML) 0.083% nebulizer solution TAKE 3 MLS BY NEBULIZATION EVERY 6 HOURS AS NEEDED FOR  WHEEZING OR SHORTNESS OF BREATH. (Patient taking differently: Take 2.5 mg by nebulization every 6 (six) hours as needed for wheezing or shortness of breath. )  . alendronate (FOSAMAX) 70 MG tablet TAKE 1 TABLET BY MOUTH EVERY 7 DAYS WITH FULL GLASS OF WATER ON EMPTY STOMACH  . amitriptyline (ELAVIL) 10 MG tablet Take 10 mg by mouth at bedtime as needed for sleep.   . Carbinoxamine Maleate 4 MG TABS TAKE 1 TABLET BY MOUTH EVERY 8 HOURS AS NEEDED (Patient taking differently: Take 4 mg by mouth every 8 (eight) hours as needed (allergies). )  . ELMIRON 100 MG capsule Take 100 mg by mouth 2 (two) times daily.   . fluticasone (  FLOVENT HFA) 220 MCG/ACT inhaler TAKE 2 PUFFS BY MOUTH TWICE A DAY (Patient taking differently: Inhale 2 puffs into the lungs 2 (two) times daily. )  . hydrocortisone cream 1 % Apply 1 application topically 2 (two) times daily as needed for itching.   . hydrOXYzine (ATARAX/VISTARIL) 25 MG tablet Take 25 mg by mouth daily after breakfast.   . levothyroxine (SYNTHROID, LEVOTHROID) 50 MCG tablet TAKE 1 TABLET EVERY DAY--- TAKES IN AM  . mometasone (NASONEX) 50 MCG/ACT nasal spray Place 2 sprays into the nose every morning.   . montelukast (SINGULAIR) 10 MG tablet Take 1 tablet (10 mg total) by mouth at bedtime. (Patient taking differently: Take 10 mg by mouth at bedtime. )  . rizatriptan (MAXALT-MLT) 10 MG disintegrating tablet TAKE 1 TABLET EVERY DAY AS NEEDED FOR MIGRAINE, MAY REPEAT IN 2 HRS IF NEEDED. MAX 2 DOSES/WK (Patient taking differently: Take 10 mg by mouth every 2 (two) hours as needed for migraine. )  . sertraline (ZOLOFT) 100 MG tablet Take 100 mg by mouth at bedtime.   Marland Kitchen umeclidinium bromide (INCRUSE ELLIPTA) 62.5 MCG/INH AEPB Inhale 1 puff into the lungs daily.  . metoprolol tartrate (LOPRESSOR) 25 MG tablet Take 0.5 tablets (12.5 mg total) by mouth 2 (two) times daily. (Patient not taking: Reported on 02/23/2018)  . primidone (MYSOLINE) 50 MG tablet Take 1 tablet (50 mg  total) by mouth 2 (two) times daily. (Patient not taking: Reported on 02/23/2018)  . rosuvastatin (CRESTOR) 20 MG tablet TAKE 1 TABLET BY MOUTH EVERY DAY (Patient not taking: Reported on 02/23/2018)  . [DISCONTINUED] anti-nausea (EMETROL) solution Take 30 mLs by mouth every 15 (fifteen) minutes as needed for nausea or vomiting.  . [DISCONTINUED] Azelastine HCl 0.15 % SOLN PLACE 2 SPRAYS INTO THE NOSE DAILY AS NEEDED. (Patient taking differently: Place 2 sprays into the nose daily as needed (congestion). )  . [DISCONTINUED] carbamazepine (TEGRETOL XR) 100 MG 12 hr tablet Take 1 tablet (100 mg total) by mouth 2 (two) times daily.  . [DISCONTINUED] [employer]S D3 125 MCG (5000 UT) capsule TAKE 1 CAPSULE (5,000 UNITS TOTAL) BY MOUTH DAILY.  . [DISCONTINUED] estradiol (ESTRACE) 0.1 MG/GM vaginal cream PLACE 1 APPLICATORFUL VAGINALLY ONCE A WEEK.  . [DISCONTINUED] gabapentin (NEURONTIN) 300 MG capsule Take 300 mg by mouth 3 (three) times daily.   . [DISCONTINUED] ketoconazole (NIZORAL) 2 % shampoo Apply 1 application topically 2 (two) times a week.  . [DISCONTINUED] loperamide (IMODIUM A-D) 2 MG tablet Take 2-4 mg by mouth 4 (four) times daily as needed for diarrhea or loose stools.   . [DISCONTINUED] methocarbamol (ROBAXIN) 500 MG tablet Take 1 tablet (500 mg total) by mouth every 6 (six) hours as needed for muscle spasms.  . [DISCONTINUED] olopatadine (PATANOL) 0.1 % ophthalmic solution Place 1 drop into both eyes 2 (two) times daily as needed for allergies.   No facility-administered encounter medications on file as of 02/23/2018.     Past Medical History:  Diagnosis Date  . Allergic rhinitis   . Anxiety   . Benign essential tremor    hands  . Bipolar 1 disorder, mixed, moderate ([place])   . Claustrophobia   . Depression   . Eczema   . Frequency of urination   . History of frequent urinary tract infections   . History of gastroesophageal reflux (GERD)    05-25-2017  per pt no issues since hiatal hernia  repair 01/ 2018  . History of hiatal hernia   . History of melanoma excision  early 64s-- BACK  . History of panic attacks   . 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 Hypothyroidism   . Intermittent palpitations    cardiology--  dr [place]  . Interstitial cystitis   . Limited jaw range of motion    s/p  bilateral TMJ surgery,  age 33s  . Migraines   . Moderate persistent asthma    pulmologist-- dr Halford Chessman  . OA (osteoarthritis)    both knees  . OSA on CPAP    per last sleep study 09/ 2017  mild OSA, AHI13/hr  . PONV (postoperative nausea and vomiting)   . PVC (premature ventricular contraction)   . Rosacea   . Sensation of pressure in bladder area    per pt intermittant  . TMJ arthralgia   . Urticaria   . Wears glasses   . White coat syndrome without hypertension    05-25-2017  per pt hx hypertension yrs ago, no issues after quitting stressful job    Past Surgical History:  Procedure Laterality Date  . [place] STUDY N/A 09/22/2015   Procedure: [place] STUDY;  Surgeon: Doran Stabler, MD;  Location: [facility];  Service: Gastroenterology;  Laterality: N/A;  . ADENOIDECTOMY    . ANAL FISSURE REPAIR  age 25  (40)  . BREAST EXCISIONAL BIOPSY Right 04-16-2003  dr p. young  [place]   benign  . BUNIONECTOMY Right early 2000s  . CARPAL TUNNEL RELEASE Left age 27 (70)  . CHOLECYSTECTOMY OPEN  age 36  . CYSTO WITH HYDRODISTENSION N/A 06/02/2017   Procedure: CYSTOSCOPY/HYDRODISTENSION OF BLADDER;  Surgeon: Irine Seal, MD;  [facility];  Service: Urology;  Laterality: N/A;  . CYSTO/ HYDRODISTENTION/  INSTILLATION THERAPY  1990s  . DX LAPAROSCOPY/  DX HYSTEROSCOPY/  D & C  08-07-2007   dr dove  [facility]  . ENDOMETRIAL ABLATION W/ NOVASURE  10-21-2008   dr Harolyn Rutherford  [facility]  . ESOPHAGEAL MANOMETRY N/A 09/22/2015   Procedure: ESOPHAGEAL MANOMETRY (EM);  Surgeon: Doran Stabler, MD;  Location: [facility];  Service: Gastroenterology;   Laterality: N/A;  with impedence   . FINGER ARTHROPLASTY Bilateral    left little finger 2016:  right middle finger 06/ 2018  . KNEE ARTHROSCOPY Bilateral x3 left ;  x3  right-- last one age 15 (84)  . NASAL SEPTUM SURGERY  age 44s  . ROBOT ASSISTED REDUCTION PARAESOPHAGEAL HIATAL HERNIA/ TYPE 2 MEDIASTINAL DISSECTION/ PRIMARY HIATAL HERNIA REPAIR/ ANTERIOR & POSTERIOR GASTROPEXY/ NISSEN FUNDOPLICATION  09-38-1829   DR GROSS  [facility]  . SINOSCOPY    . TEMPOROMANDIBULAR JOINT SURGERY Bilateral x3  -- age 21s   per pt used graft  . TONSILLECTOMY    . TOTAL KNEE ARTHROPLASTY Left 12/26/2017   Procedure: LEFT TOTAL KNEE ARTHROPLASTY;  Surgeon: Gaynelle Arabian, MD;  Location: [facility];  Service: Orthopedics;  Laterality: Left;  60min  . TRANSTHORACIC ECHOCARDIOGRAM  12-06-2017   dr [place]   ef 55-60%, grade 1 diastoilc dysfunction, trivial MR  . TRIGGER FINGER RELEASE Bilateral last one 2017   several release's bilaterally  . ULNAR NERVE TRANSPOSITION Bilateral age 34 (89)    Social History   Socioeconomic History  . Marital status: Divorced    Spouse name: Not on file  . Number of children: 0  . Years of education: Not on file  . Highest education level: Not on file  Occupational History  . Not on  file  Social Needs  . Financial resource strain: Not on file  . Food insecurity:    Worry: Not on file    Inability: Not on file  . Transportation needs:    Medical: Not on file    Non-medical: Not on file  Tobacco Use  . Smoking status: Never Smoker  . Smokeless tobacco: Never Used  Substance and Sexual Activity  . Alcohol use: Yes    Alcohol/week: 0.0 standard drinks    Comment: once every 3-4 months  . Drug use: No  . Sexual activity: Not on file  Lifestyle  . Physical activity:    Days per week: Not on file    Minutes per session: Not on file  . Stress: Not on file  Relationships  . Social connections:    Talks on phone: Not on file    Gets together: Not on file     Attends religious service: Not on file    Active member of club or organization: Not on file    Attends meetings of clubs or organizations: Not on file    Relationship status: Not on file  . Intimate partner violence:    Fear of current or ex partner: Not on file    Emotionally abused: Not on file    Physically abused: Not on file    Forced sexual activity: Not on file  Other Topics Concern  . Not on file  Social History Narrative  . Not on file    Family Status  Relation Name Status  . Mother  Deceased       HTN, thyroid  . Father  Deceased       testicular cancer  . Sister sally Alive       colon polyps  . Unknown  (Not Specified)    Review of Systems Review of Systems  Constitutional: Positive for malaise/fatigue.  HENT: Negative.   Eyes: Negative.   Respiratory: Negative.   Cardiovascular: Negative.   Genitourinary: Negative.   Musculoskeletal: Negative.   Skin: Negative.   Endo/Heme/Allergies: Negative.   Psychiatric/Behavioral: Positive for depression.     Objective:   VITALS:   Vitals:   02/23/18 0833  BP: 132/88  Pulse: (!) 108  SpO2: 96%  Weight: 141 lb (64 kg)  Height: 5' (1.524 m)    GEN:  The patient appears stated age and is in NAD.  She is anxious. HEENT:  Normocephalic, atraumatic.  The mucous membranes are moist. The superficial temporal arteries are without ropiness or tenderness. CV:  RRR Lungs:  CTAB Neck/HEME:  There are no carotid bruits bilaterally.  Neurological examination:  Orientation: The patient is alert and oriented x3. Cranial nerves: There is good facial symmetry. The speech is fluent and clear. Soft palate rises symmetrically and there is no tongue deviation. Hearing is intact to conversational tone. Sensation: Sensation is intact to light touch throughout Motor: Strength is 5/5 in the bilateral upper and lower extremities.   Shoulder shrug is equal and symmetric.  There is no pronator drift.  Movement  examination: Tone: There is just mild increased tone in the right upper extremity.  Tone elsewhere is normal (once I was able to get her to relax). Abnormal movements: No rest tremor is noted.  Very little postural tremor. Coordination:  There is minimal decremation with RAM's, seen only with toe taps on the right. Gait and Station: The patient states that she is unable to arise out of the chair without the use of her  hands, and refuses to make an attempt (reports due to recent knee surgery).  She pushes off of the arms of the chair.  She is given a walker.  She has short staffed.  She slightly drags the left leg.  When the walker is taken away, her gait really does not change very much.  She does not shuffle.  She is just slightly unbalanced.     Movement examination: Tone: There is normal tone in UE/LE Abnormal movements: Minimal tremor of the outstretched hands.  This does not change with intention.  Archimedes spirals are actually drawn quite well today.  She only spills a little bit of water when asked to pour from one glass to another. Coordination:  There is no decremation with RAM's, with any form of RAMS, including alternating supination and pronation of the forearm, hand opening and closing, finger taps, heel taps and toe taps. Gait and Station: The patient has no difficulty arising out of a deep-seated chair without the use of the hands.  She is stooped slightly at the waist.  She is short stepped.  Lab Results  Component Value Date   TSH 0.603 01/17/2018     Chemistry      Component Value Date/Time   NA 138 01/23/2018 0550   K 3.7 01/23/2018 0550   CL 100 01/23/2018 0550   CO2 27 01/23/2018 0550   BUN 7 (L) 01/23/2018 0550   CREATININE 0.59 01/23/2018 0550   CREATININE 1.00 11/21/2014 0959      Component Value Date/Time   CALCIUM 8.9 01/23/2018 0550   ALKPHOS 137 (H) 01/16/2018 0446   AST 34 01/16/2018 0446   ALT 41 01/16/2018 0446   BILITOT 0.8 01/16/2018 0446          Assessment/Plan:   1.Parkinsonism  -pts geodon was just d/c at beginning of December.  I again explained that it can take up to 6 months once an antipsychotic is d/c to know if this is a SE of medication (secondary parkinsonism) or PD, as clinically they look the same.  However, she looks markedly better than was described to me at the hospital and just mildly parkinsonian.  In fact, she doesn't meet criteria via [place] brain [facility] for PD.  I reassured her that I think that this will continue to get better.  I will also continue to follow her  -continue PT  -add OT  -She is off of primidone.  I did not restart that.  I saw very little tremor today. 2.  Severe anxiety and depression, with severe hoarding  -she fired her psychiatrist as she she wanted to find someone who would give her Xanax or clonazepam.  I told her that she needed to find a psychiatrist who treats her with what she needs, not with what she wants.  I told her that Xanax and clonazepam on the long-term are likely inappropriate in her case.  -Not suicidal or homicidal today. 3.  I will plan on seeing the patient back in 6 months, sooner should new neurologic issues arise.  Greater than 50% of the 25-minute visit was spent in counseling with patient and sister.  This did not include the 40 min of record review which was detailed above, which was non face to face time.

## 2018-02-22 DIAGNOSIS — M1711 Unilateral primary osteoarthritis, right knee: Secondary | ICD-10-CM | POA: Diagnosis not present

## 2018-02-22 DIAGNOSIS — M6281 Muscle weakness (generalized): Secondary | ICD-10-CM | POA: Diagnosis not present

## 2018-02-22 DIAGNOSIS — M1712 Unilateral primary osteoarthritis, left knee: Secondary | ICD-10-CM | POA: Diagnosis not present

## 2018-02-22 DIAGNOSIS — R269 Unspecified abnormalities of gait and mobility: Secondary | ICD-10-CM | POA: Diagnosis not present

## 2018-02-23 ENCOUNTER — Other Ambulatory Visit: Payer: Self-pay

## 2018-02-23 ENCOUNTER — Encounter: Payer: Self-pay | Admitting: Neurology

## 2018-02-23 ENCOUNTER — Telehealth: Payer: Self-pay | Admitting: Neurology

## 2018-02-23 ENCOUNTER — Ambulatory Visit: Payer: Medicare HMO | Admitting: Neurology

## 2018-02-23 VITALS — BP 132/88 | HR 108 | Ht 60.0 in | Wt 141.0 lb

## 2018-02-23 DIAGNOSIS — F331 Major depressive disorder, recurrent, moderate: Secondary | ICD-10-CM | POA: Diagnosis not present

## 2018-02-23 DIAGNOSIS — F423 Hoarding disorder: Secondary | ICD-10-CM | POA: Diagnosis not present

## 2018-02-23 DIAGNOSIS — R251 Tremor, unspecified: Secondary | ICD-10-CM

## 2018-02-23 DIAGNOSIS — F411 Generalized anxiety disorder: Secondary | ICD-10-CM | POA: Diagnosis not present

## 2018-02-23 DIAGNOSIS — R69 Illness, unspecified: Secondary | ICD-10-CM | POA: Diagnosis not present

## 2018-02-23 DIAGNOSIS — G2111 Neuroleptic induced parkinsonism: Secondary | ICD-10-CM | POA: Diagnosis not present

## 2018-02-23 NOTE — Patient Instructions (Signed)
1. We will add occupational therapy to your [facility] home health orders.   2. Stay off Primidone.   3. Follow up in 6 months.

## 2018-02-23 NOTE — Telephone Encounter (Signed)
Referral faxed to [facility] to add OT to her home health orders to fax number [phone] with confirmation received.

## 2018-02-23 NOTE — Patient Outreach (Signed)
[facility]) Care Management  02/23/2018  [name] 1955/07/07 [id number]   EMMI-GENERAL DISCHARGE  RED ON EMMI ALERT Day # 1 Date: 02/22/18   10:31 AM Red Alert Reason: "Read discharge papers? No", Know who to call about changes in condition? No", "Unfilled prescriptions? Yes", "Able to fill today/tomorrow? No"  Outreach attempt # 1 successful.    Spoke with patient and her sister [name], listed as approved person to speak with, patient verified HIPAA.   Reviewed and addressed red alerts. Patient expressed having some anxiety during the call. She stated she did not receive d/c papers when d/c from [facility]. When asked if she understood the need to f/u with her regular doctors and what medications she should be on, she answered, "I guess so". Patient confirmed following up with her Neurologist today. She states the Neurologist d/c her Primidone and referred her to her PCP. She does not have all of her medications in the her home. She states some of her medications were lost and she is too overwhelmed to locate them at this time. She has her sister [name] also on the call, sister [name] states she cannot help locate the patient's medicines at this time due to the patient "is too overwhelmed". Patient states some of her medicines were left at friends house prior to her knee surgery. She states in addition some new prescriptions given upon d/c from [facility] were lost. Patient advised she made Dr. Carles Collet aware. She denies having financial difficulty paying for her medicines. Patient gives consent for a [facility] referral and would prefer a call today as her sister [name] is available to speak with the pharmacist along side her.   Patient states she lives alone. She states "my home situation is turbulent right now". She states her sister [name] is temporarily visiting her to try to help but has created some problems. Her sister [name] provides transportation and is available to  assist her as needed, although she lives in [place].   Patient denies having any other questions at this time and is agreeable to a f/u call back next week.   Plan: RN CM will outreach to patient next week to f/u on her progress toward wellness, medication adherence and assess for further CM needs. RN CM will send a [facility] referral and request f/u with the patient today.      Diane Merino, RN,CCM [facility] Care Management Care Management Coordinator Direct Phone: [phone] Toll Free: [phone] Fax: [phone]

## 2018-02-23 NOTE — Addendum Note (Signed)
Addended byAnnamaria Helling on: 02/23/2018 09:34 AM   Modules accepted: Orders

## 2018-02-24 DIAGNOSIS — R269 Unspecified abnormalities of gait and mobility: Secondary | ICD-10-CM | POA: Diagnosis not present

## 2018-02-24 DIAGNOSIS — M1711 Unilateral primary osteoarthritis, right knee: Secondary | ICD-10-CM | POA: Diagnosis not present

## 2018-02-24 DIAGNOSIS — M1712 Unilateral primary osteoarthritis, left knee: Secondary | ICD-10-CM | POA: Diagnosis not present

## 2018-02-24 DIAGNOSIS — M6281 Muscle weakness (generalized): Secondary | ICD-10-CM | POA: Diagnosis not present

## 2018-02-26 ENCOUNTER — Other Ambulatory Visit: Payer: Self-pay | Admitting: Allergy and Immunology

## 2018-02-27 ENCOUNTER — Other Ambulatory Visit: Payer: Self-pay

## 2018-02-27 ENCOUNTER — Ambulatory Visit (INDEPENDENT_AMBULATORY_CARE_PROVIDER_SITE_OTHER): Payer: Medicare HMO | Admitting: Family Medicine

## 2018-02-27 ENCOUNTER — Encounter: Payer: Self-pay | Admitting: Family Medicine

## 2018-02-27 ENCOUNTER — Ambulatory Visit: Payer: Medicare HMO | Admitting: Allergy and Immunology

## 2018-02-27 VITALS — BP 132/84 | HR 120 | Temp 98.8°F | Resp 12 | Ht 60.0 in | Wt 138.5 lb

## 2018-02-27 DIAGNOSIS — R69 Illness, unspecified: Secondary | ICD-10-CM | POA: Diagnosis not present

## 2018-02-27 DIAGNOSIS — R739 Hyperglycemia, unspecified: Secondary | ICD-10-CM | POA: Diagnosis not present

## 2018-02-27 DIAGNOSIS — I10 Essential (primary) hypertension: Secondary | ICD-10-CM | POA: Diagnosis not present

## 2018-02-27 DIAGNOSIS — R748 Abnormal levels of other serum enzymes: Secondary | ICD-10-CM

## 2018-02-27 DIAGNOSIS — I493 Ventricular premature depolarization: Secondary | ICD-10-CM | POA: Diagnosis not present

## 2018-02-27 DIAGNOSIS — F3111 Bipolar disorder, current episode manic without psychotic features, mild: Secondary | ICD-10-CM

## 2018-02-27 LAB — HEMOGLOBIN A1C: HEMOGLOBIN A1C: 6.3 % (ref 4.6–6.5)

## 2018-02-27 MED ORDER — CARBAMAZEPINE ER 100 MG PO CP12: 100.0000 mg | ORAL_CAPSULE | Freq: Two times a day (BID) | ORAL | 2 refills | Status: DC

## 2018-02-27 MED ORDER — METOPROLOL TARTRATE 25 MG PO TABS: 25.0000 mg | ORAL_TABLET | Freq: Two times a day (BID) | ORAL | 1 refills | Status: DC

## 2018-02-27 NOTE — Patient Outreach (Signed)
[facility]) Care Management  02/27/2018  [name] 03/13/1955 [id number]   EMMI-GENERAL DISCHARGE  RED ON EMMI ALERT Day # 4 Date: 02/25/18   10:49 AM Red Alert Reason: "Able to fill today/tomorrow? No", "Lost interest in things? Yes", "Sad/hopeless/anxious/empty? Yes"  Outreach attempt # 1 successful.    Spoke with patient, HIPAA verified.   Reviewed and addressed EMMI Red Alerts. Patient states her anxiety has improved some but states it depends on the moment. She states, "theres a lot going on here" but declines to share her family circumstances. Her medication circumstances have not changed. She has a f/ appointment with her PCP today at 2 pm for medication reconciliation. Patient states her sister [name] will drive her to the appointment. She is receptive to a f/u call later this week. She states appreciation for the call today.   Plan: RN CM will outreach to patient in 3-4 days to assess for progress towards wellness and assess for ongoing CM needs.     Barb Merino, RN,CCM [facility] Care Management Care Management Coordinator Direct Phone: [phone] Toll Free: [phone] Fax: [phone]

## 2018-02-27 NOTE — Progress Notes (Signed)
HPI:   [name] is a 63 y.o. female, who is here today with her sister to follow on recent hospitalization. She was admitted on 01/16/2018 and discharged to a SNF on 01/24/2018. She was discharged home on 02/20/18.  She presented to the ER on date of admission because of progressive gait instability.  Discharge diagnosis:   -Serotonin syndrome. -Dyskinesia, rigidity.  Bipolar disorder, Geodon was discontinue. Carbamazepine was started  Ut she did not get Rx at the time of the discharged. She did not noted side effects. She is planning on establishing with a new psychiatrist, she is waiting on appt information. . Lab Results  Component Value Date   CREATININE 0.59 01/23/2018   BUN 7 (L) 01/23/2018   NA 138 01/23/2018   K 3.7 01/23/2018   CL 100 01/23/2018   CO2 27 01/23/2018    Lab Results  Component Value Date   HGBA1C 6.1 10/24/2017    Lab Results  Component Value Date   ALT 41 01/16/2018   AST 34 01/16/2018   ALKPHOS 137 (H) 01/16/2018   BILITOT 0.8 01/16/2018   Mild anemia. She has not noted blood in stool,melena,or gross hematuria. She is not on iron supplementation.   Lab Results  Component Value Date   WBC 11.9 (H) 01/22/2018   HGB 10.8 (L) 01/22/2018   HCT 34.0 (L) 01/22/2018   MCV 94.2 01/22/2018   PLT 283 01/22/2018   Sinus tachycardia and HTN,she was started on Metoprolol Tartrate 12.5 mg bid. She is not checking BP or HR at home.  Essential tremor: Primidone was also discontinued. Tremor slightly worse sometimes.   HH has been arranged. PT 3 x week. OT evaluation is pending.  She is using a walker at home, trying to "wean off."  She is concerned about results of echo done on 12/06/17 and some lab results done in the hospital. She had cardiology evaluation before having TKR due to tachycardia.  Echo showed LVEF 55-60% and grade I diastolic dysfunction. Denies orthopnea or PND.  Elevated alk phosphatase. Denies  nausea,vomiting,or abdominal pain. It has been elevated in the past.  Some elevated BS's, no Hx of DM.   Review of Systems  Constitutional: Positive for activity change, appetite change and fatigue. Negative for fever.  HENT: Negative for mouth sores, nosebleeds and trouble swallowing.   Eyes: Negative for redness and visual disturbance.  Respiratory: Negative for cough, shortness of breath and wheezing.   Cardiovascular: Negative for chest pain, palpitations and leg swelling.  Gastrointestinal: Negative for abdominal pain, nausea and vomiting.       Negative for changes in bowel habits.  Genitourinary: Negative for decreased urine volume, dysuria and hematuria.  Musculoskeletal: Positive for arthralgias, back pain and gait problem.  Skin: Negative for rash.  Allergic/Immunologic: Positive for environmental allergies.  Neurological: Positive for tremors. Negative for seizures, syncope, weakness and headaches.  Psychiatric/Behavioral: Negative for confusion. The patient is nervous/anxious.      Current Outpatient Medications on File Prior to Visit  Medication Sig Dispense Refill  . albuterol (PROVENTIL HFA;VENTOLIN HFA) 108 (90 Base) MCG/ACT inhaler INHALE 1-2 PUFFS INTO THE LUNGS DAILY AS NEEDED FOR WHEEZING OR SHORTNESS OF BREATH. 54 Inhaler 0  . albuterol (PROVENTIL) (2.5 MG/3ML) 0.083% nebulizer solution TAKE 3 MLS BY NEBULIZATION EVERY 6 HOURS AS NEEDED FOR WHEEZING OR SHORTNESS OF BREATH. (Patient taking differently: Take 2.5 mg by nebulization every 6 (six) hours as needed for wheezing or shortness of breath. )  75 mL 1  . alendronate (FOSAMAX) 70 MG tablet TAKE 1 TABLET BY MOUTH EVERY 7 DAYS WITH FULL GLASS OF WATER ON EMPTY STOMACH 12 tablet 2  . amitriptyline (ELAVIL) 10 MG tablet Take 10 mg by mouth at bedtime as needed for sleep.   1  . Carbinoxamine Maleate 4 MG TABS TAKE 1 TABLET BY MOUTH EVERY 8 HOURS AS NEEDED (Patient taking differently: Take 4 mg by mouth every 8 (eight)  hours as needed (allergies). ) 28 tablet 3  . ELMIRON 100 MG capsule Take 100 mg by mouth 2 (two) times daily.   1  . fluticasone (FLOVENT HFA) 220 MCG/ACT inhaler TAKE 2 PUFFS BY MOUTH TWICE A DAY 36 Inhaler 1  . hydrocortisone cream 1 % Apply 1 application topically 2 (two) times daily as needed for itching.     . hydrOXYzine (ATARAX/VISTARIL) 25 MG tablet Take 25 mg by mouth daily after breakfast.   1  . levothyroxine (SYNTHROID, LEVOTHROID) 50 MCG tablet TAKE 1 TABLET EVERY DAY--- TAKES IN AM 90 tablet 1  . mometasone (NASONEX) 50 MCG/ACT nasal spray Place 2 sprays into the nose every morning.     . montelukast (SINGULAIR) 10 MG tablet Take 1 tablet (10 mg total) by mouth at bedtime. (Patient taking differently: Take 10 mg by mouth at bedtime. ) 30 tablet 10  . rizatriptan (MAXALT-MLT) 10 MG disintegrating tablet TAKE 1 TABLET EVERY DAY AS NEEDED FOR MIGRAINE, MAY REPEAT IN 2 HRS IF NEEDED. MAX 2 DOSES/WK (Patient taking differently: Take 10 mg by mouth every 2 (two) hours as needed for migraine. ) 9 tablet 3  . rosuvastatin (CRESTOR) 20 MG tablet TAKE 1 TABLET BY MOUTH EVERY DAY 90 tablet 1  . sertraline (ZOLOFT) 100 MG tablet Take 100 mg by mouth at bedtime.     Marland Kitchen umeclidinium bromide (INCRUSE ELLIPTA) 62.5 MCG/INH AEPB Inhale 1 puff into the lungs daily. 30 each 4   No current facility-administered medications on file prior to visit.      Past Medical History:  Diagnosis Date  . Allergic rhinitis   . Anxiety   . Benign essential tremor    hands  . Bipolar 1 disorder, mixed, moderate ([place])   . Claustrophobia   . Depression   . Eczema   . Frequency of urination   . History of frequent urinary tract infections   . History of gastroesophageal reflux (GERD)    05-25-2017  per pt no issues since hiatal hernia repair 01/ 2018  . History of hiatal hernia   . History of melanoma excision    early 2000s-- BACK  . History of panic attacks   . 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 Hypothyroidism   . Intermittent palpitations    cardiology--  dr Martinique  . Interstitial cystitis   . 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 Migraines   . Moderate persistent asthma    pulmologist-- dr Halford Chessman  . OA (osteoarthritis)    both knees  . OSA on CPAP    per last sleep study 09/ 2017  mild OSA, AHI13/hr  . PONV (postoperative nausea and vomiting)   . PVC (premature ventricular contraction)   . Rosacea   . Sensation of pressure in bladder area    per pt intermittant  . TMJ arthralgia   . Urticaria   . Wears glasses   . White  coat syndrome without hypertension    05-25-2017  per pt hx hypertension yrs ago, no issues after quitting stressful job   Allergies  Allergen Reactions  . Indomethacin Other (See Comments)    Muscle spasms Causes muscle spasms in neck  . Amlodipine Swelling    Peripheral edema  . Pseudoephedrine Other (See Comments)    I fly off the walls  CANNOT SLEEP  . Risperidone And Related Other (See Comments)    Stomach upset, insomnia, drooling, tremors, "jerks," sensitivity to touch.   . Bupivacaine Hives  . Pentazocine Other (See Comments)    Unknown reaction MADE ME "LACTATE"  . Propoxyphene Itching and Nausea And Vomiting    darvocet  . Sulfa Antibiotics Other (See Comments)    Unknown reaction  . Tramadol Other (See Comments)    Patient can not remember  . Aspirin Other (See Comments)    upset stomach, ringing in the ears  . Codeine Itching and Other (See Comments)    Anything related to codeine  . Etodolac Rash and Other (See Comments)  . Ivp Dye [Iodinated Diagnostic Agents] Itching    Flushed and Fever, itch all over  . Propranolol Nausea Only and Other (See Comments)    Social History   Socioeconomic History  . Marital status: Divorced    Spouse name: Not on file  . Number of children: 0  . Years of education: Not on file  . Highest education level: Not on file  Occupational  History  . Not on file  Social Needs  . Financial resource strain: Not on file  . Food insecurity:    Worry: Not on file    Inability: Not on file  . Transportation needs:    Medical: Not on file    Non-medical: Not on file  Tobacco Use  . Smoking status: Never Smoker  . Smokeless tobacco: Never Used  Substance and Sexual Activity  . Alcohol use: Yes    Alcohol/week: 0.0 standard drinks    Comment: once every 3-4 months  . Drug use: No  . Sexual activity: Not on file  Lifestyle  . Physical activity:    Days per week: Not on file    Minutes per session: Not on file  . Stress: Not on file  Relationships  . Social connections:    Talks on phone: Not on file    Gets together: Not on file    Attends religious service: Not on file    Active member of club or organization: Not on file    Attends meetings of clubs or organizations: Not on file    Relationship status: Not on file  Other Topics Concern  . Not on file  Social History Narrative  . Not on file    Vitals:   02/27/18 1338  BP: 132/84  Pulse: (!) 120  Resp: 12  Temp: 98.8 F (37.1 C)  SpO2: 97%   Body mass index is 27.05 kg/m.   Physical Exam  Nursing note and vitals reviewed. Constitutional: She is oriented to person, place, and time. She appears well-developed. No distress.  HENT:  Head: Normocephalic and atraumatic.  Mouth/Throat: Oropharynx is clear and moist and mucous membranes are normal.  Eyes: Pupils are equal, round, and reactive to light. Conjunctivae are normal.  Cardiovascular: Regular rhythm. Tachycardia present.  No murmur heard. Pulses:      Dorsalis pedis pulses are 2+ on the right side and 2+ on the left side.  Respiratory: Effort normal and  breath sounds normal. No respiratory distress.  GI: Soft. She exhibits no mass. There is no hepatomegaly. There is no abdominal tenderness.  Musculoskeletal:        General: No edema.  Lymphadenopathy:    She has no cervical adenopathy.    Neurological: She is alert and oriented to person, place, and time. She has normal strength. She displays tremor. No cranial nerve deficit. Gait (Unstable, assisted by her sister) abnormal.  Skin: Skin is warm. No rash noted. No erythema.  Psychiatric: Her mood appears anxious.  Well groomed, good eye contact.    ASSESSMENT AND PLAN:  [name] was seen today for hospitalization follow-up.  Orders Placed This Encounter  Procedures  . Hemoglobin A1c  . Glutamic acid decarboxylase auto abs    Lab Results  Component Value Date   HGBA1C 6.3 02/27/2018     Essential hypertension BP otherwise adequate controlled. Because HR Metoprolol Tartrate was increased to 25 mg bid. Continue low salt diet. We discussed echo results. Recommend monitoring BP at home but she cannot afford a BP monitor. She is not interested in home nurse visits.  -     metoprolol tartrate (LOPRESSOR) 25 MG tablet; Take 1 tablet (25 mg total) by mouth 2 (two) times daily.  PVC (premature ventricular contraction) It is not well controlled., Asymptomatic. Metoprolol increased from 12.5 mg bid to 25 mg bid. I tried to teach her how to check her HR but she doe snot think she can do it. Her sister is a Marine scientist but does not want to be responsible for checking VS's. F/U in 2 months.  -     metoprolol tartrate (LOPRESSOR) 25 MG tablet; Take 1 tablet (25 mg total) by mouth 2 (two) times daily.  Elevated alkaline phosphatase level Possible causes discussed. We will continue following.  -     Glutamic acid decarboxylase auto abs; Future   Hyperglycemia Healthy life style for primary prevention recommended.  -     Hemoglobin A1c  Bipolar affective disorder, currently manic, mild (HCC) Resumed Carbamazepine. Some side effects discussed. Pending appt with psychiatrist. Instructed about warning signs.  -     carbamazepine (CARBATROL) 100 MG 12 hr capsule; Take 1 capsule (100 mg total) by mouth 2 (two) times  daily.    Return in about 2 months (around 04/28/2018) for HTN.    [name] G. Martinique, MD  [facility] [place] office.

## 2018-02-27 NOTE — Patient Instructions (Addendum)
A few things to remember from today's visit:   Essential hypertension  PVC (premature ventricular contraction)  Elevated alkaline phosphatase level  Hyperglycemia  Metoprolol increased to 25 mg 2 times daily. Carbamazepine resumed.  Lab Results  Component Value Date   ALT 41 01/16/2018   AST 34 01/16/2018   ALKPHOS 137 (H) 01/16/2018   BILITOT 0.8 01/16/2018   Lab Results  Component Value Date   CREATININE 0.59 01/23/2018   BUN 7 (L) 01/23/2018   NA 138 01/23/2018   K 3.7 01/23/2018   CL 100 01/23/2018   CO2 27 01/23/2018   Lab Results  Component Value Date   HGBA1C 6.1 10/24/2017   Lab Results  Component Value Date   WBC 11.9 (H) 01/22/2018   HGB 10.8 (L) 01/22/2018   HCT 34.0 (L) 01/22/2018   MCV 94.2 01/22/2018   PLT 283 01/22/2018    Please be sure medication list is accurate. If a new problem present, please set up appointment sooner than planned today.

## 2018-02-28 ENCOUNTER — Ambulatory Visit: Payer: Medicare HMO | Admitting: Allergy and Immunology

## 2018-02-28 ENCOUNTER — Ambulatory Visit: Payer: Self-pay

## 2018-02-28 ENCOUNTER — Telehealth: Payer: Self-pay | Admitting: Neurology

## 2018-02-28 ENCOUNTER — Other Ambulatory Visit: Payer: Self-pay | Admitting: Pharmacist

## 2018-02-28 DIAGNOSIS — M6281 Muscle weakness (generalized): Secondary | ICD-10-CM | POA: Diagnosis not present

## 2018-02-28 DIAGNOSIS — R269 Unspecified abnormalities of gait and mobility: Secondary | ICD-10-CM | POA: Diagnosis not present

## 2018-02-28 DIAGNOSIS — M1712 Unilateral primary osteoarthritis, left knee: Secondary | ICD-10-CM | POA: Diagnosis not present

## 2018-02-28 DIAGNOSIS — M1711 Unilateral primary osteoarthritis, right knee: Secondary | ICD-10-CM | POA: Diagnosis not present

## 2018-02-28 NOTE — Patient Outreach (Signed)
[facility]) Care Management  [place]   02/28/2018  [name] 03-Jul-1955 [id number]  Reason for referral: Medication Management  Referral source: [facility] RN Current insurance:[employer]  PMHx includes but not limited to:  HTN, HLD, bipolar, migraines, asthma  Outreach:  Successful telephone call with [name] today.  HIPAA identifiers verified.  paitent is agreeable to review medications telephonically.  She states she just saw her PCP on 02/27/2018 for hospital follow up.  She states her metoprolol was increased to 25mg  BID due to her increased heart rate.  Also, her PCP restarted her on carbamazepine 100mg  BID.  She states she has a new Psychiatrist appointment on 03/08/2018.  She will then know exactly what she will be taking for her bipolar condition.  She continues to take medications as prescribed.  Denies side effects.  She states she has no financial issues obtaining medications.  Patient reports she needs assistance getting a BP meter.  [place] to assist.  Objective: Lab Results  Component Value Date   CREATININE 0.59 01/23/2018   CREATININE 0.56 01/22/2018   CREATININE 0.57 01/21/2018    Lab Results  Component Value Date   HGBA1C 6.3 02/27/2018    Lipid Panel     Component Value Date/Time   CHOL 163 07/01/2017 1250   TRIG 88.0 07/01/2017 1250   HDL 75.20 07/01/2017 1250   CHOLHDL 2 07/01/2017 1250   VLDL 17.6 07/01/2017 1250   LDLCALC 70 07/01/2017 1250   LDLDIRECT 143.0 09/14/2016 1037    BP Readings from Last 3 Encounters:  02/27/18 132/84  02/23/18 132/88  01/24/18 140/80    Allergies  Allergen Reactions  . Indomethacin Other (See Comments)    Muscle spasms Causes muscle spasms in neck  . Amlodipine Swelling    Peripheral edema  . Pseudoephedrine Other (See Comments)    I fly off the walls  CANNOT SLEEP  . Risperidone And Related Other (See Comments)    Stomach upset, insomnia, drooling, tremors, "jerks,"  sensitivity to touch.   . Bupivacaine Hives  . Pentazocine Other (See Comments)    Unknown reaction MADE ME "LACTATE"  . Propoxyphene Itching and Nausea And Vomiting    darvocet  . Sulfa Antibiotics Other (See Comments)    Unknown reaction  . Tramadol Other (See Comments)    Patient can not remember  . Aspirin Other (See Comments)    upset stomach, ringing in the ears  . Codeine Itching and Other (See Comments)    Anything related to codeine  . Etodolac Rash and Other (See Comments)  . Ivp Dye [Iodinated Diagnostic Agents] Itching    Flushed and Fever, itch all over  . Propranolol Nausea Only and Other (See Comments)    Medications Reviewed Today    Reviewed by [name], [facility] (Pharmacist) on 02/28/18 at 1141  Med List Status: <None>  Medication Order Taking? Sig Documenting Provider Last Dose Status Informant  albuterol (PROVENTIL HFA;VENTOLIN HFA) 108 (90 Base) MCG/ACT inhaler [id number] Yes INHALE 1-2 PUFFS INTO THE LUNGS DAILY AS NEEDED FOR WHEEZING OR SHORTNESS OF BREATH. Whitney, Diane Muta, MD Taking Active   albuterol (PROVENTIL) (2.5 MG/3ML) 0.083% nebulizer solution [id number] Yes TAKE 3 MLS BY NEBULIZATION EVERY 6 HOURS AS NEEDED FOR WHEEZING OR SHORTNESS OF BREATH.  Patient taking differently:  Take 2.5 mg by nebulization every 6 (six) hours as needed for wheezing or shortness of breath.    Whitney, Diane Muta, MD Taking Active Self  alendronate [facility])  70 MG tablet [id number] Yes TAKE 1 TABLET BY MOUTH EVERY 7 DAYS WITH FULL GLASS OF WATER ON EMPTY STOMACH Whitney, Diane G, MD Taking Active   amitriptyline (ELAVIL) 10 MG tablet [id number]  Take 10 mg by mouth at bedtime as needed for sleep.  [provider]  Active Self  carbamazepine (CARBATROL) 100 MG 12 hr capsule [id number] Yes Take 1 capsule (100 mg total) by mouth 2 (two) times daily. Whitney, Diane G, MD Taking Active   Carbinoxamine Maleate 4 MG TABS [id number] Yes TAKE 1 TABLET BY MOUTH EVERY 8 HOURS  AS NEEDED  Patient taking differently:  Take 4 mg by mouth every 8 (eight) hours as needed (allergies).    Diane Mings, MD Taking Active Self  ELMIRON 100 MG capsule [id number] Yes Take 100 mg by mouth 2 (two) times daily.  [provider] Taking Active Self  fluticasone (FLOVENT HFA) 220 MCG/ACT inhaler [id number] Yes TAKE 2 PUFFS BY MOUTH TWICE A DAY Whitney, Diane Muta, MD Taking Active   hydrocortisone cream 1 % [id number] Yes Apply 1 application topically 2 (two) times daily as needed for itching.  [provider] Taking Active Self  hydrOXYzine (ATARAX/VISTARIL) 25 MG tablet [id number] Yes Take 25 mg by mouth daily after breakfast.  [provider] Taking Active Self  levothyroxine (SYNTHROID, LEVOTHROID) 50 MCG tablet [id number] Yes TAKE 1 TABLET EVERY DAY--- TAKES IN AM Whitney, Diane G, MD Taking Active   metoprolol tartrate (LOPRESSOR) 25 MG tablet [id number] Yes Take 1 tablet (25 mg total) by mouth 2 (two) times daily. Whitney, Diane G, MD Taking Active   mometasone (NASONEX) 50 MCG/ACT nasal spray [id number] Yes Place 2 sprays into the nose every morning.  [provider] Taking Active Self  montelukast (SINGULAIR) 10 MG tablet [id number] Yes Take 1 tablet (10 mg total) by mouth at bedtime.  Patient taking differently:  Take 10 mg by mouth at bedtime.    Whitney, Diane Mu, NP Taking Active Self  rizatriptan (MAXALT-MLT) 10 MG disintegrating tablet [id number] Yes TAKE 1 TABLET EVERY DAY AS NEEDED FOR MIGRAINE, MAY REPEAT IN 2 HRS IF NEEDED. MAX 2 DOSES/WK  Patient taking differently:  Take 10 mg by mouth every 2 (two) hours as needed for migraine.    Whitney, Diane G, MD Taking Active Self  rosuvastatin (CRESTOR) 20 MG tablet [id number] Yes TAKE 1 TABLET BY MOUTH EVERY DAY Whitney, Diane G, MD Taking Active   sertraline (ZOLOFT) 100 MG tablet [id number] Yes Take 100 mg by mouth at bedtime.  [provider] Taking Active Self  umeclidinium bromide  (INCRUSE ELLIPTA) 62.5 MCG/INH AEPB [id number] Yes Inhale 1 puff into the lungs daily. Whitney, Diane Muta, MD Taking Active Self          Assessment:  Drugs sorted by system:  Neurologic/Psychologic: amitryptyline, carbamazepine, rizatriptan, sertraline  Cardiovascular: metoprolol, rosuvastatin  Pulmonary/Allergy: Incruse Ellipta (LAMA), albuterol, Flovent (ICS), montelukast, mometasone nasal  Endocrine: levothyroxine, alendronate  Medication Review Findings:  . Patient continues taking medications as prescribed.  She will follow up with new psychiatrist for potential changes to psych medication therapy. . DDI: Plasma concentrations and pharmacologic effects of amitriptyline may be increased by sertraline.  Patient reportedly stable and will be followed closely by Psych    Medication Assistance Findings:  No medication assistance needs identified  [employer] OTC catalog mailed to patient to obtain free BP meter or additional OTC supplies.  Patient is allotted $25/month to use in OTC items in catalog  Plan: -  I will follow up with patient after psych appointment on 03/08/2018  Regina Eck, PharmD, [place]  [phone]

## 2018-02-28 NOTE — Telephone Encounter (Signed)
Patient wanting to know about Occupational Therapy that she was supposed to be set up with. She hasn't heard anything. Please call her back at [phone]. Thanks!

## 2018-02-28 NOTE — Telephone Encounter (Signed)
Patient made aware OT order was faxed to [facility] to add to her home health orders with confirmation received. She will let me know in another week if they do not contact her.

## 2018-03-01 DIAGNOSIS — M6281 Muscle weakness (generalized): Secondary | ICD-10-CM | POA: Diagnosis not present

## 2018-03-01 DIAGNOSIS — R269 Unspecified abnormalities of gait and mobility: Secondary | ICD-10-CM | POA: Diagnosis not present

## 2018-03-01 DIAGNOSIS — M1711 Unilateral primary osteoarthritis, right knee: Secondary | ICD-10-CM | POA: Diagnosis not present

## 2018-03-01 DIAGNOSIS — M1712 Unilateral primary osteoarthritis, left knee: Secondary | ICD-10-CM | POA: Diagnosis not present

## 2018-03-03 ENCOUNTER — Other Ambulatory Visit: Payer: Self-pay

## 2018-03-03 DIAGNOSIS — M1712 Unilateral primary osteoarthritis, left knee: Secondary | ICD-10-CM | POA: Diagnosis not present

## 2018-03-03 DIAGNOSIS — M1711 Unilateral primary osteoarthritis, right knee: Secondary | ICD-10-CM | POA: Diagnosis not present

## 2018-03-03 DIAGNOSIS — R269 Unspecified abnormalities of gait and mobility: Secondary | ICD-10-CM | POA: Diagnosis not present

## 2018-03-03 DIAGNOSIS — M6281 Muscle weakness (generalized): Secondary | ICD-10-CM | POA: Diagnosis not present

## 2018-03-03 LAB — GLUTAMIC ACID DECARBOXYLASE AUTO ABS: Glutamic Acid Decarb Ab: <5 IU/mL (ref <5)

## 2018-03-03 NOTE — Patient Outreach (Signed)
[facility]) Care Management  03/03/2018  [name] 06/22/1955 [id number]   EMMI-GENERAL DISCHARGE FOLLOW-UP CALL  RED ON EMMI ALERT Day #4 Date:02/25/18 10:49 AM Red Alert Reason:"Able to fill today/tomorrow? No", "Lost interest in things? Yes", "Sad/hopeless/anxious/empty? Yes"  Outreach FOLLOW-UP attempt #1 successful.   Spoke with patient, HIPAA verified.   Follow-up: Patient confirms she has all of her medications in the home and is taking them as prescribed. Patient spoke with [facility] pharmacist Lottie Dawson yesterday. She will f/u with a new Psychiatrist at the end of the month. Patient feels she is on tract now but does report having come down with a "sinus infection'. She was not symptomatic when she saw her PCP this week. She is coughing and has post nasal drainage but denies having chills, night sweats, nausea, vomiting or diarrhea. She is unsure if she has been running a fever and does not have a thermometer in her home. Patient confirms she is using both of her inhalers as directed. One of her sisters is present with her in her home today. She does not feel that she will go into to see her PCP for these symptoms.   RN CM encouraged patient to notify her doctor of any new or worsening s/s including the above mentioned symptoms and or worsening cough/congestion or shortness of breath, body aches and or increased generalized weakness or fatigue. RN CM instructed patient to drink plenty of fluids, especially water and to balance her activity with rest. RN CM provided patient with the 24 hour nurse advice line phone number for non-urgent needs. Patient denies having any further questions or concerns at this time. She verbalizes understanding and is appreciative of the call today.   Plan:  RN CM will close case at this time due to patient is stable and no further CM needs are identified at this time.    Barb Merino, RN,CCM [facility] Care Management Care Management  Coordinator Direct Phone: [phone] Toll Free: [phone] Fax: [phone]

## 2018-03-04 DIAGNOSIS — J4520 Mild intermittent asthma, uncomplicated: Secondary | ICD-10-CM | POA: Diagnosis not present

## 2018-03-06 DIAGNOSIS — M1711 Unilateral primary osteoarthritis, right knee: Secondary | ICD-10-CM | POA: Diagnosis not present

## 2018-03-06 DIAGNOSIS — M1712 Unilateral primary osteoarthritis, left knee: Secondary | ICD-10-CM | POA: Diagnosis not present

## 2018-03-06 DIAGNOSIS — M6281 Muscle weakness (generalized): Secondary | ICD-10-CM | POA: Diagnosis not present

## 2018-03-06 DIAGNOSIS — R269 Unspecified abnormalities of gait and mobility: Secondary | ICD-10-CM | POA: Diagnosis not present

## 2018-03-08 DIAGNOSIS — R69 Illness, unspecified: Secondary | ICD-10-CM | POA: Diagnosis not present

## 2018-03-08 DIAGNOSIS — M6281 Muscle weakness (generalized): Secondary | ICD-10-CM | POA: Diagnosis not present

## 2018-03-08 DIAGNOSIS — F411 Generalized anxiety disorder: Secondary | ICD-10-CM | POA: Diagnosis not present

## 2018-03-08 DIAGNOSIS — F410 Panic disorder [episodic paroxysmal anxiety] without agoraphobia: Secondary | ICD-10-CM | POA: Diagnosis not present

## 2018-03-08 DIAGNOSIS — R269 Unspecified abnormalities of gait and mobility: Secondary | ICD-10-CM | POA: Diagnosis not present

## 2018-03-08 DIAGNOSIS — M1711 Unilateral primary osteoarthritis, right knee: Secondary | ICD-10-CM | POA: Diagnosis not present

## 2018-03-08 DIAGNOSIS — M1712 Unilateral primary osteoarthritis, left knee: Secondary | ICD-10-CM | POA: Diagnosis not present

## 2018-03-10 DIAGNOSIS — M6281 Muscle weakness (generalized): Secondary | ICD-10-CM | POA: Diagnosis not present

## 2018-03-10 DIAGNOSIS — M1711 Unilateral primary osteoarthritis, right knee: Secondary | ICD-10-CM | POA: Diagnosis not present

## 2018-03-10 DIAGNOSIS — R269 Unspecified abnormalities of gait and mobility: Secondary | ICD-10-CM | POA: Diagnosis not present

## 2018-03-10 DIAGNOSIS — M1712 Unilateral primary osteoarthritis, left knee: Secondary | ICD-10-CM | POA: Diagnosis not present

## 2018-03-14 DIAGNOSIS — R269 Unspecified abnormalities of gait and mobility: Secondary | ICD-10-CM | POA: Diagnosis not present

## 2018-03-14 DIAGNOSIS — M6281 Muscle weakness (generalized): Secondary | ICD-10-CM | POA: Diagnosis not present

## 2018-03-14 DIAGNOSIS — M1712 Unilateral primary osteoarthritis, left knee: Secondary | ICD-10-CM | POA: Diagnosis not present

## 2018-03-14 DIAGNOSIS — M1711 Unilateral primary osteoarthritis, right knee: Secondary | ICD-10-CM | POA: Diagnosis not present

## 2018-03-17 ENCOUNTER — Ambulatory Visit: Payer: Self-pay | Admitting: Pharmacist

## 2018-03-17 DIAGNOSIS — M1711 Unilateral primary osteoarthritis, right knee: Secondary | ICD-10-CM | POA: Diagnosis not present

## 2018-03-17 DIAGNOSIS — Z7689 Persons encountering health services in other specified circumstances: Secondary | ICD-10-CM | POA: Diagnosis not present

## 2018-03-17 DIAGNOSIS — M6281 Muscle weakness (generalized): Secondary | ICD-10-CM | POA: Diagnosis not present

## 2018-03-17 DIAGNOSIS — M1712 Unilateral primary osteoarthritis, left knee: Secondary | ICD-10-CM | POA: Diagnosis not present

## 2018-03-17 DIAGNOSIS — R269 Unspecified abnormalities of gait and mobility: Secondary | ICD-10-CM | POA: Diagnosis not present

## 2018-03-20 ENCOUNTER — Other Ambulatory Visit: Payer: Self-pay | Admitting: Pharmacist

## 2018-03-20 ENCOUNTER — Ambulatory Visit: Payer: Self-pay | Admitting: Pharmacist

## 2018-03-20 NOTE — Patient Outreach (Signed)
[facility]) Care Management [place]  03/20/2018  [name] Jun 29, 1955 [id number]  Reason for referral: medication management  [facility] pharmacy case is being closed due to the following reasons:  Goals have been met.  Successful call to [name] with HIPAA identifiers verified.  Patient states she recently saw new psychiatrist who increased carbamazepine to '200mg'$  BID.  No other changes noted.  Patient denies needing further pharmacy assistance.  Patient has been provided [facility] CM contact information if assistance needed in the future.    Thank you for allowing [facility] pharmacy to be involved in this patient's care.    Regina Eck, PharmD, [place]  [phone]

## 2018-03-21 DIAGNOSIS — M6281 Muscle weakness (generalized): Secondary | ICD-10-CM | POA: Diagnosis not present

## 2018-03-21 DIAGNOSIS — R269 Unspecified abnormalities of gait and mobility: Secondary | ICD-10-CM | POA: Diagnosis not present

## 2018-03-21 DIAGNOSIS — M1712 Unilateral primary osteoarthritis, left knee: Secondary | ICD-10-CM | POA: Diagnosis not present

## 2018-03-21 DIAGNOSIS — M1711 Unilateral primary osteoarthritis, right knee: Secondary | ICD-10-CM | POA: Diagnosis not present

## 2018-03-22 ENCOUNTER — Other Ambulatory Visit: Payer: Self-pay | Admitting: Family Medicine

## 2018-03-22 DIAGNOSIS — I493 Ventricular premature depolarization: Secondary | ICD-10-CM

## 2018-03-22 DIAGNOSIS — I10 Essential (primary) hypertension: Secondary | ICD-10-CM

## 2018-03-24 DIAGNOSIS — M1711 Unilateral primary osteoarthritis, right knee: Secondary | ICD-10-CM | POA: Diagnosis not present

## 2018-03-24 DIAGNOSIS — M6281 Muscle weakness (generalized): Secondary | ICD-10-CM | POA: Diagnosis not present

## 2018-03-24 DIAGNOSIS — M1712 Unilateral primary osteoarthritis, left knee: Secondary | ICD-10-CM | POA: Diagnosis not present

## 2018-03-24 DIAGNOSIS — R269 Unspecified abnormalities of gait and mobility: Secondary | ICD-10-CM | POA: Diagnosis not present

## 2018-03-27 DIAGNOSIS — M6281 Muscle weakness (generalized): Secondary | ICD-10-CM | POA: Diagnosis not present

## 2018-03-27 DIAGNOSIS — R269 Unspecified abnormalities of gait and mobility: Secondary | ICD-10-CM | POA: Diagnosis not present

## 2018-03-27 DIAGNOSIS — M1711 Unilateral primary osteoarthritis, right knee: Secondary | ICD-10-CM | POA: Diagnosis not present

## 2018-03-27 DIAGNOSIS — M1712 Unilateral primary osteoarthritis, left knee: Secondary | ICD-10-CM | POA: Diagnosis not present

## 2018-03-30 ENCOUNTER — Other Ambulatory Visit: Payer: Self-pay | Admitting: Allergy and Immunology

## 2018-03-30 DIAGNOSIS — M1712 Unilateral primary osteoarthritis, left knee: Secondary | ICD-10-CM | POA: Diagnosis not present

## 2018-03-30 DIAGNOSIS — R269 Unspecified abnormalities of gait and mobility: Secondary | ICD-10-CM | POA: Diagnosis not present

## 2018-03-30 DIAGNOSIS — M6281 Muscle weakness (generalized): Secondary | ICD-10-CM | POA: Diagnosis not present

## 2018-03-30 DIAGNOSIS — M1711 Unilateral primary osteoarthritis, right knee: Secondary | ICD-10-CM | POA: Diagnosis not present

## 2018-03-30 NOTE — Telephone Encounter (Signed)
Courtesy refill  

## 2018-04-03 DIAGNOSIS — M6281 Muscle weakness (generalized): Secondary | ICD-10-CM | POA: Diagnosis not present

## 2018-04-03 DIAGNOSIS — M1711 Unilateral primary osteoarthritis, right knee: Secondary | ICD-10-CM | POA: Diagnosis not present

## 2018-04-03 DIAGNOSIS — R269 Unspecified abnormalities of gait and mobility: Secondary | ICD-10-CM | POA: Diagnosis not present

## 2018-04-03 DIAGNOSIS — M1712 Unilateral primary osteoarthritis, left knee: Secondary | ICD-10-CM | POA: Diagnosis not present

## 2018-04-04 DIAGNOSIS — J4520 Mild intermittent asthma, uncomplicated: Secondary | ICD-10-CM | POA: Diagnosis not present

## 2018-04-05 DIAGNOSIS — F410 Panic disorder [episodic paroxysmal anxiety] without agoraphobia: Secondary | ICD-10-CM | POA: Diagnosis not present

## 2018-04-05 DIAGNOSIS — F411 Generalized anxiety disorder: Secondary | ICD-10-CM | POA: Diagnosis not present

## 2018-04-05 DIAGNOSIS — R69 Illness, unspecified: Secondary | ICD-10-CM | POA: Diagnosis not present

## 2018-04-06 DIAGNOSIS — M1712 Unilateral primary osteoarthritis, left knee: Secondary | ICD-10-CM | POA: Diagnosis not present

## 2018-04-06 DIAGNOSIS — M6281 Muscle weakness (generalized): Secondary | ICD-10-CM | POA: Diagnosis not present

## 2018-04-06 DIAGNOSIS — M1711 Unilateral primary osteoarthritis, right knee: Secondary | ICD-10-CM | POA: Diagnosis not present

## 2018-04-06 DIAGNOSIS — R269 Unspecified abnormalities of gait and mobility: Secondary | ICD-10-CM | POA: Diagnosis not present

## 2018-04-10 DIAGNOSIS — M1712 Unilateral primary osteoarthritis, left knee: Secondary | ICD-10-CM | POA: Diagnosis not present

## 2018-04-10 DIAGNOSIS — M6281 Muscle weakness (generalized): Secondary | ICD-10-CM | POA: Diagnosis not present

## 2018-04-10 DIAGNOSIS — R269 Unspecified abnormalities of gait and mobility: Secondary | ICD-10-CM | POA: Diagnosis not present

## 2018-04-10 DIAGNOSIS — M1711 Unilateral primary osteoarthritis, right knee: Secondary | ICD-10-CM | POA: Diagnosis not present

## 2018-04-13 DIAGNOSIS — R269 Unspecified abnormalities of gait and mobility: Secondary | ICD-10-CM | POA: Diagnosis not present

## 2018-04-13 DIAGNOSIS — M1712 Unilateral primary osteoarthritis, left knee: Secondary | ICD-10-CM | POA: Diagnosis not present

## 2018-04-13 DIAGNOSIS — M1711 Unilateral primary osteoarthritis, right knee: Secondary | ICD-10-CM | POA: Diagnosis not present

## 2018-04-13 DIAGNOSIS — M6281 Muscle weakness (generalized): Secondary | ICD-10-CM | POA: Diagnosis not present

## 2018-04-14 ENCOUNTER — Other Ambulatory Visit: Payer: Self-pay | Admitting: Family Medicine

## 2018-04-14 DIAGNOSIS — I10 Essential (primary) hypertension: Secondary | ICD-10-CM

## 2018-04-14 DIAGNOSIS — I493 Ventricular premature depolarization: Secondary | ICD-10-CM

## 2018-04-21 ENCOUNTER — Other Ambulatory Visit: Payer: Self-pay | Admitting: Allergy and Immunology

## 2018-04-22 ENCOUNTER — Other Ambulatory Visit: Payer: Self-pay | Admitting: Family Medicine

## 2018-04-22 DIAGNOSIS — E559 Vitamin D deficiency, unspecified: Secondary | ICD-10-CM

## 2018-04-26 ENCOUNTER — Telehealth: Payer: Self-pay | Admitting: Allergy and Immunology

## 2018-04-26 ENCOUNTER — Other Ambulatory Visit: Payer: Self-pay

## 2018-04-26 MED ORDER — MONTELUKAST SODIUM 10 MG PO TABS: 10.0000 mg | ORAL_TABLET | Freq: Every day | ORAL | 10 refills | Status: DC

## 2018-04-26 MED ORDER — UMECLIDINIUM BROMIDE 62.5 MCG/INH IN AEPB: 1.0000 | INHALATION_SPRAY | Freq: Every day | RESPIRATORY_TRACT | 0 refills | Status: DC

## 2018-04-26 MED ORDER — FLUTICASONE PROPIONATE HFA 220 MCG/ACT IN AERO: INHALATION_SPRAY | RESPIRATORY_TRACT | 1 refills | Status: DC

## 2018-04-26 NOTE — Telephone Encounter (Signed)
Pt called and needs to have Singulair, Incruse Ellipta, Flovent [facility] [phone].

## 2018-04-26 NOTE — Telephone Encounter (Signed)
Refills sent

## 2018-04-28 ENCOUNTER — Other Ambulatory Visit: Payer: Self-pay | Admitting: Internal Medicine

## 2018-04-28 ENCOUNTER — Ambulatory Visit (INDEPENDENT_AMBULATORY_CARE_PROVIDER_SITE_OTHER): Payer: Medicare HMO | Admitting: Family Medicine

## 2018-04-28 ENCOUNTER — Other Ambulatory Visit: Payer: Self-pay

## 2018-04-28 ENCOUNTER — Encounter: Payer: Self-pay | Admitting: Family Medicine

## 2018-04-28 VITALS — BP 122/74 | HR 69 | Temp 99.4°F | Resp 12 | Ht 60.0 in | Wt 121.1 lb

## 2018-04-28 DIAGNOSIS — I10 Essential (primary) hypertension: Secondary | ICD-10-CM | POA: Diagnosis not present

## 2018-04-28 DIAGNOSIS — R945 Abnormal results of liver function studies: Secondary | ICD-10-CM | POA: Diagnosis not present

## 2018-04-28 DIAGNOSIS — R630 Anorexia: Secondary | ICD-10-CM

## 2018-04-28 DIAGNOSIS — R7989 Other specified abnormal findings of blood chemistry: Secondary | ICD-10-CM

## 2018-04-28 DIAGNOSIS — D509 Iron deficiency anemia, unspecified: Secondary | ICD-10-CM

## 2018-04-28 DIAGNOSIS — E039 Hypothyroidism, unspecified: Secondary | ICD-10-CM | POA: Diagnosis not present

## 2018-04-28 DIAGNOSIS — Z1231 Encounter for screening mammogram for malignant neoplasm of breast: Secondary | ICD-10-CM

## 2018-04-28 LAB — CBC WITH DIFFERENTIAL/PLATELET
Basophils Absolute: 0 10*3/uL (ref 0.0–0.1)
Basophils Relative: 0.5 % (ref 0.0–3.0)
Eosinophils Absolute: 0 10*3/uL (ref 0.0–0.7)
Eosinophils Relative: 0.3 % (ref 0.0–5.0)
HCT: 40.3 % (ref 36.0–46.0)
Hemoglobin: 13.5 g/dL (ref 12.0–15.0)
Lymphocytes Relative: 19.6 % (ref 12.0–46.0)
Lymphs Abs: 2 10*3/uL (ref 0.7–4.0)
MCHC: 33.4 g/dL (ref 30.0–36.0)
MCV: 84.7 fl (ref 78.0–100.0)
Monocytes Absolute: 0.7 10*3/uL (ref 0.1–1.0)
Monocytes Relative: 6.8 % (ref 3.0–12.0)
NEUTROS ABS: 7.6 10*3/uL (ref 1.4–7.7)
Neutrophils Relative %: 72.8 % (ref 43.0–77.0)
Platelets: 235 10*3/uL (ref 150.0–400.0)
RBC: 4.76 Mil/uL (ref 3.87–5.11)
RDW: 15.9 % — ABNORMAL HIGH (ref 11.5–15.5)
WBC: 10.4 10*3/uL (ref 4.0–10.5)

## 2018-04-28 LAB — TSH: TSH: 0.54 u[IU]/mL (ref 0.35–4.50)

## 2018-04-28 LAB — HEPATIC FUNCTION PANEL
ALT: 18 U/L (ref 0–35)
AST: 17 U/L (ref 0–37)
Albumin: 4.5 g/dL (ref 3.5–5.2)
Alkaline Phosphatase: 105 U/L (ref 39–117)
Bilirubin, Direct: 0.1 mg/dL (ref 0.0–0.3)
Total Bilirubin: 0.4 mg/dL (ref 0.2–1.2)
Total Protein: 6.9 g/dL (ref 6.0–8.3)

## 2018-04-28 NOTE — Assessment & Plan Note (Signed)
Mild and asymptomatic. Last colonoscopy in 03/2014, otherwise negative, except for diminutive polyp. Further recommendations will be given according to CBC results.

## 2018-04-28 NOTE — Assessment & Plan Note (Signed)
Adequately controlled. No changes in current management. DASH diet recommended. Eye exam recommended annually. F/U in 6 months, before if needed.  

## 2018-04-28 NOTE — Progress Notes (Signed)
[name] is a 63 y.o.female, who is here today for chronic disease management. Last follow up visit: On 02/27/2018.  Hypertension and sinus tachycardia: Currently on metoprolol tartrate 25 mg twice daily, adjusted last visit. She is taking medications as instructed, no side effects reported. She is not checking BP, her BP monitor is not accurate. She has some BP checked during PT, most 120s-130's/70-80's HR: Most 80's-90's, occasionally in the low 100's  She has not noted visual changes, exertional chest pain, dyspnea,  focal weakness, or edema. OSA, she is not wearing CPAP at night, "it broke." According to patient, she to the machine today pulmonologist but it did work at his office.  Sometimes having morning headaches and does not feel rested first thing in the morning.  Lab Results  Component Value Date   CREATININE 0.59 01/23/2018   BUN 7 (L) 01/23/2018   NA 138 01/23/2018   K 3.7 01/23/2018   CL 100 01/23/2018   CO2 27 01/23/2018   Intermittently she has had abnormal LFTs. ALT has been normal for the past few months. Alkaline phosphatase 148 in 09/2015, 144 in 03/2016. She has not noted abdominal pain, jaundice, stool/urine color changes.  Lab Results  Component Value Date   ALT 41 01/16/2018   AST 34 01/16/2018   ALKPHOS 137 (H) 01/16/2018   BILITOT 0.8 01/16/2018   Mild anemia, she is not on iron supplementation. She has not noted nose/gum bleeding, more bruising than usual, gross hematuria, blood in the stool, or melena.   Lab Results  Component Value Date   WBC 11.9 (H) 01/22/2018   HGB 10.8 (L) 01/22/2018   HCT 34.0 (L) 01/22/2018   MCV 94.2 01/22/2018   PLT 283 01/22/2018   Today she is concerned about decreased appetite, she has lost some weight. She denies eating lunch. Food intake and certain hours cause nausea. She is drinking a boost in the morning and at night to advise of potato salad, chicken salad, and a yogurt.  She is  currently on levothyroxine 50 mcg daily. She has not noted worsening tremor, palpitation, or heat/cold intolerance.   Lab Results  Component Value Date   TSH 0.603 01/17/2018   According to patient, her psychiatrist thinks decreased appetite related to depression.  She saw her psychiatrist a month ago and has an appointment next week. She also follows with counselor, last seen in 12/2017, she has an appointment next Monday.  She wonders if she can take something to increase appetite.   Review of Systems  Constitutional: Positive for fatigue. Negative for activity change, appetite change and fever.  HENT: Negative for mouth sores, nosebleeds and trouble swallowing.   Eyes: Negative for redness and visual disturbance.  Respiratory: Negative for cough, shortness of breath and wheezing.   Cardiovascular: Negative for chest pain, palpitations and leg swelling.  Gastrointestinal: Negative for abdominal pain, nausea and vomiting.       Negative for changes in bowel habits.  Endocrine: Negative for cold intolerance and heat intolerance.  Genitourinary: Negative for decreased urine volume and hematuria.  Musculoskeletal: Positive for arthralgias.  Skin: Negative for rash and wound.  Neurological: Positive for tremors (No more than usual). Negative for syncope, weakness and headaches.  Hematological: Does not bruise/bleed easily.  Psychiatric/Behavioral: Negative for confusion and hallucinations. The patient is nervous/anxious.      Current Outpatient Medications on File Prior to Visit  Medication Sig Dispense Refill  . albuterol (PROVENTIL HFA;VENTOLIN HFA) 108 (90 Base)  MCG/ACT inhaler INHALE 1-2 PUFFS INTO THE LUNGS DAILY AS NEEDED FOR WHEEZING OR SHORTNESS OF BREATH. 54 Inhaler 0  . albuterol (PROVENTIL) (2.5 MG/3ML) 0.083% nebulizer solution TAKE 3 MLS BY NEBULIZATION EVERY 6 HOURS AS NEEDED FOR WHEEZING OR SHORTNESS OF BREATH. (Patient taking differently: Take 2.5 mg by nebulization  every 6 (six) hours as needed for wheezing or shortness of breath. ) 75 mL 1  . alendronate (FOSAMAX) 70 MG tablet TAKE 1 TABLET BY MOUTH EVERY 7 DAYS WITH FULL GLASS OF WATER ON EMPTY STOMACH 12 tablet 2  . amitriptyline (ELAVIL) 10 MG tablet Take 10 mg by mouth at bedtime as needed for sleep.   1  . Carbinoxamine Maleate 4 MG TABS TAKE 1 TABLET BY MOUTH EVERY 8 HOURS AS NEEDED (Patient taking differently: Take 4 mg by mouth every 8 (eight) hours as needed (allergies). ) 28 tablet 3  . clonazePAM (KLONOPIN) 0.5 MG tablet TAKE 1 TABLET DAILY AS NEEDED FOR PANIC    . [employer] D3 125 MCG (5000 UT) capsule TAKE 1 CAPSULE (5,000 UNITS TOTAL) BY MOUTH DAILY. 90 capsule 0  . ELMIRON 100 MG capsule Take 100 mg by mouth 2 (two) times daily.   1  . fluticasone (FLOVENT HFA) 220 MCG/ACT inhaler TAKE 2 PUFFS BY MOUTH TWICE A DAY 12 g 1  . hydrocortisone cream 1 % Apply 1 application topically 2 (two) times daily as needed for itching.     . levothyroxine (SYNTHROID, LEVOTHROID) 50 MCG tablet TAKE 1 TABLET EVERY DAY--- TAKES IN AM 90 tablet 1  . metoprolol tartrate (LOPRESSOR) 25 MG tablet TAKE 1 TABLET BY MOUTH TWICE A DAY 60 tablet 1  . montelukast (SINGULAIR) 10 MG tablet Take 1 tablet (10 mg total) by mouth at bedtime. 30 tablet 10  . Oxcarbazepine (TRILEPTAL) 300 MG tablet Take 300 mg by mouth daily.    . rizatriptan (MAXALT-MLT) 10 MG disintegrating tablet TAKE 1 TABLET EVERY DAY AS NEEDED FOR MIGRAINE, MAY REPEAT IN 2 HRS IF NEEDED. MAX 2 DOSES/WK (Patient taking differently: Take 10 mg by mouth every 2 (two) hours as needed for migraine. ) 9 tablet 3  . rosuvastatin (CRESTOR) 20 MG tablet TAKE 1 TABLET BY MOUTH EVERY DAY 90 tablet 1  . sertraline (ZOLOFT) 100 MG tablet Take 100 mg by mouth at bedtime.     Marland Kitchen umeclidinium bromide (INCRUSE ELLIPTA) 62.5 MCG/INH AEPB Inhale 1 puff into the lungs daily. 30 each 0   No current facility-administered medications on file prior to visit.      Past Medical  History:  Diagnosis Date  . Allergic rhinitis   . Anxiety   . Benign essential tremor    hands  . Bipolar 1 disorder, mixed, moderate ([place])   . Claustrophobia   . Depression   . Eczema   . Frequency of urination   . History of frequent urinary tract infections   . History of gastroesophageal reflux (GERD)    05-25-2017  per pt no issues since hiatal hernia repair 01/ 2018  . History of hiatal hernia   . History of melanoma excision    early 2000s-- BACK  . History of panic attacks   . 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 Hypothyroidism   . Intermittent palpitations    cardiology--  dr [place]  . Interstitial cystitis   . Limited jaw range of motion    s/p  bilateral TMJ surgery,  age 67s  .  Migraines   . Moderate persistent asthma    pulmologist-- dr Halford Chessman  . OA (osteoarthritis)    both knees  . OSA on CPAP    per last sleep study 09/ 2017  mild OSA, AHI13/hr  . PONV (postoperative nausea and vomiting)   . PVC (premature ventricular contraction)   . Rosacea   . Sensation of pressure in bladder area    per pt intermittant  . TMJ arthralgia   . Urticaria   . Wears glasses   . White coat syndrome without hypertension    05-25-2017  per pt hx hypertension yrs ago, no issues after quitting stressful job    Allergies  Allergen Reactions  . Indomethacin Other (See Comments)    Muscle spasms Causes muscle spasms in neck  . Amlodipine Swelling    Peripheral edema  . Carbamazepine Other (See Comments)    Easy and unusual bleeding   . Pseudoephedrine Other (See Comments)    I fly off the walls  CANNOT SLEEP  . Risperidone And Related Other (See Comments)    Stomach upset, insomnia, drooling, tremors, "jerks," sensitivity to touch.   . Bupivacaine Hives  . Pentazocine Other (See Comments)    Unknown reaction MADE ME "LACTATE"  . Propoxyphene Itching and Nausea And Vomiting    darvocet  . Sulfa Antibiotics Other (See Comments)    Unknown  reaction  . Tramadol Other (See Comments)    Patient can not remember  . Aspirin Other (See Comments)    upset stomach, ringing in the ears  . Codeine Itching and Other (See Comments)    Anything related to codeine  . Etodolac Rash and Other (See Comments)  . Ivp Dye [Iodinated Diagnostic Agents] Itching    Flushed and Fever, itch all over  . Propranolol Nausea Only and Other (See Comments)    Social History   Socioeconomic History  . Marital status: Divorced    Spouse name: Not on file  . Number of children: 0  . Years of education: Not on file  . Highest education level: Not on file  Occupational History  . Not on file  Social Needs  . Financial resource strain: Not on file  . Food insecurity:    Worry: Not on file    Inability: Not on file  . Transportation needs:    Medical: Not on file    Non-medical: Not on file  Tobacco Use  . Smoking status: Never Smoker  . Smokeless tobacco: Never Used  Substance and Sexual Activity  . Alcohol use: Yes    Alcohol/week: 0.0 standard drinks    Comment: once every 3-4 months  . Drug use: No  . Sexual activity: Not on file  Lifestyle  . Physical activity:    Days per week: Not on file    Minutes per session: Not on file  . Stress: Not on file  Relationships  . Social connections:    Talks on phone: Not on file    Gets together: Not on file    Attends religious service: Not on file    Active member of club or organization: Not on file    Attends meetings of clubs or organizations: Not on file    Relationship status: Not on file  Other Topics Concern  . Not on file  Social History Narrative  . Not on file    Vitals:   04/28/18 1026  BP: 122/74  Pulse: 69  Resp: 12  Temp: 99.4 F (37.4  C)  SpO2: 97%   Body mass index is 23.66 kg/m.    Physical Exam  Nursing note and vitals reviewed. Constitutional: She is oriented to person, place, and time. She appears well-developed. No distress.  HENT:  Head:  Normocephalic and atraumatic.  Mouth/Throat: Oropharynx is clear and moist and mucous membranes are normal.  Eyes: Pupils are equal, round, and reactive to light. Conjunctivae are normal.  Cardiovascular: Normal rate and regular rhythm.  No murmur heard. Pulses:      Dorsalis pedis pulses are 2+ on the right side and 2+ on the left side.  Respiratory: Effort normal and breath sounds normal. No respiratory distress.  GI: Soft. She exhibits no mass. There is no hepatomegaly. There is no abdominal tenderness.  Musculoskeletal:        General: No edema.  Lymphadenopathy:    She has no cervical adenopathy.  Neurological: She is alert and oriented to person, place, and time. She has normal strength. No cranial nerve deficit.  Otherwise stable gait,it is not assisted. Mildly antalgic.  Skin: Skin is warm. No rash noted. No erythema.  Psychiatric: Her mood appears anxious.  Well groomed, good eye contact.    ASSESSMENT AND PLAN:    [name] is here today for chronic disease management.  Orders Placed This Encounter  Procedures  . CBC with Differential/Platelet  . TSH  . Hepatic function panel    Lab Results  Component Value Date   TSH 0.54 04/28/2018   Lab Results  Component Value Date   ALT 18 04/28/2018   AST 17 04/28/2018   ALKPHOS 105 04/28/2018   BILITOT 0.4 04/28/2018   Lab Results  Component Value Date   WBC 10.4 04/28/2018   HGB 13.5 04/28/2018   HCT 40.3 04/28/2018   MCV 84.7 04/28/2018   PLT 235.0 04/28/2018    Hypothyroidism No changes in current management, will follow labs done today and will give further recommendations accordingly.    Essential hypertension Adequately controlled. No changes in current management. DASH diet recommended. Eye exam recommended annually. F/U in 6 months, before if needed.   Abnormal liver function test Last hepatic test with mildly elevated alkaline phosphatase. We will continue following. Further  recommendation will be given according to LFT results.  Iron deficiency anemia Mild and asymptomatic. Last colonoscopy in 03/2014, otherwise negative, except for diminutive polyp. Further recommendations will be given according to CBC results.   Decreased appetite Discussed possible etiologies. Recommend trying to increase food intake as tolerated.  She states that the "only food" she can tolerate is what she is already eating. For lunch she can try same food she eats for dinner. I do not recommend medication to increase appetite at this time.  Continue following with psychiatrist as recommended.   Return in about 6 months (around 10/29/2018) for F/U.    Janene Yousuf G. [place], MD  [facility]. [place] office.

## 2018-04-28 NOTE — Patient Instructions (Signed)
A few things to remember from today's visit:   Decreased appetite  Hypothyroidism, unspecified type - Plan: TSH  Iron deficiency anemia, unspecified iron deficiency anemia type - Plan: CBC with Differential/Platelet  Elevated alkaline phosphatase level - Plan: Hepatic function panel  Essential hypertension  Try adding 2 bites of chicken salad for lunch. No changes in metoprolol.  Please be sure medication list is accurate. If a new problem present, please set up appointment sooner than planned today.

## 2018-04-28 NOTE — Assessment & Plan Note (Addendum)
No changes in current management, will follow labs done today and will give further recommendations accordingly.  

## 2018-04-28 NOTE — Assessment & Plan Note (Signed)
Last hepatic test with mildly elevated alkaline phosphatase. We will continue following. Further recommendation will be given according to LFT results.

## 2018-05-01 DIAGNOSIS — R69 Illness, unspecified: Secondary | ICD-10-CM | POA: Diagnosis not present

## 2018-05-02 ENCOUNTER — Other Ambulatory Visit: Payer: Self-pay | Admitting: Family Medicine

## 2018-05-02 ENCOUNTER — Other Ambulatory Visit: Payer: Self-pay | Admitting: *Deleted

## 2018-05-02 ENCOUNTER — Telehealth: Payer: Self-pay | Admitting: *Deleted

## 2018-05-02 ENCOUNTER — Telehealth: Payer: Self-pay | Admitting: Family Medicine

## 2018-05-02 DIAGNOSIS — G43809 Other migraine, not intractable, without status migrainosus: Secondary | ICD-10-CM

## 2018-05-02 NOTE — Telephone Encounter (Signed)
Patient given lab results on 05/02/2018. Will mail results to address on file.  Copied from [place] [phone]. Topic: General - Other >> May 02, 2018  3:51 PM Rayann Heman wrote: Reason for CRM: pt called and that she would like lab results and also wanted to know if the provider had completed paper work that she left at her last apt. Please advise

## 2018-05-02 NOTE — Telephone Encounter (Signed)
Medication removed from list as requested.

## 2018-05-02 NOTE — Telephone Encounter (Signed)
Copied from [place] [id number]. Topic: General - Other >> May 02, 2018 10:00 AM Lennox Solders wrote: Reason for CRM: Pt left on voicemail on refill line that is not taking amitriptyline and would like med to be taken off her med list and request a callback

## 2018-05-03 DIAGNOSIS — J4520 Mild intermittent asthma, uncomplicated: Secondary | ICD-10-CM | POA: Diagnosis not present

## 2018-05-04 DIAGNOSIS — F411 Generalized anxiety disorder: Secondary | ICD-10-CM | POA: Diagnosis not present

## 2018-05-04 DIAGNOSIS — F410 Panic disorder [episodic paroxysmal anxiety] without agoraphobia: Secondary | ICD-10-CM | POA: Diagnosis not present

## 2018-05-04 DIAGNOSIS — R69 Illness, unspecified: Secondary | ICD-10-CM | POA: Diagnosis not present

## 2018-05-07 ENCOUNTER — Other Ambulatory Visit: Payer: Self-pay | Admitting: Family Medicine

## 2018-05-07 DIAGNOSIS — I10 Essential (primary) hypertension: Secondary | ICD-10-CM

## 2018-05-07 DIAGNOSIS — I493 Ventricular premature depolarization: Secondary | ICD-10-CM

## 2018-05-09 DIAGNOSIS — R69 Illness, unspecified: Secondary | ICD-10-CM | POA: Diagnosis not present

## 2018-05-15 DIAGNOSIS — R69 Illness, unspecified: Secondary | ICD-10-CM | POA: Diagnosis not present

## 2018-05-16 ENCOUNTER — Ambulatory Visit: Payer: Self-pay | Admitting: Neurology

## 2018-05-19 ENCOUNTER — Other Ambulatory Visit: Payer: Self-pay | Admitting: Allergy and Immunology

## 2018-05-22 ENCOUNTER — Ambulatory Visit (INDEPENDENT_AMBULATORY_CARE_PROVIDER_SITE_OTHER): Payer: Medicare HMO | Admitting: Allergy and Immunology

## 2018-05-22 ENCOUNTER — Encounter: Payer: Self-pay | Admitting: Allergy and Immunology

## 2018-05-22 DIAGNOSIS — J3089 Other allergic rhinitis: Secondary | ICD-10-CM

## 2018-05-22 DIAGNOSIS — J4540 Moderate persistent asthma, uncomplicated: Secondary | ICD-10-CM | POA: Diagnosis not present

## 2018-05-22 DIAGNOSIS — H1013 Acute atopic conjunctivitis, bilateral: Secondary | ICD-10-CM

## 2018-05-22 DIAGNOSIS — K219 Gastro-esophageal reflux disease without esophagitis: Secondary | ICD-10-CM

## 2018-05-22 MED ORDER — FLUTICASONE PROPIONATE 50 MCG/ACT NA SUSP: 1.0000 | Freq: Two times a day (BID) | NASAL | 5 refills | Status: DC | PRN

## 2018-05-22 MED ORDER — AZELASTINE HCL 0.15 % NA SOLN: 1.0000 | Freq: Two times a day (BID) | NASAL | 5 refills | Status: DC

## 2018-05-22 NOTE — Progress Notes (Signed)
Start time:  9:53 am Finish Time:  10:24 am Where are you located:  Patient is at home. Do you give [place] permission to bill your insurance:  Yes. Are you signed up for my chart:  Patient doesn't have access to computer.    [name] not working.

## 2018-05-22 NOTE — Assessment & Plan Note (Signed)
Stable.  For now, continue Flovent (fluticasone) 220 g, 2 inhalations twice a day, Incruse Ellipta 1 inhalation daily, montelukast 10 mg daily bedtime, and albuterol every 6 hours if needed.  I have encouraged consistent use of spacer device with HFA inhalers.  She has verbalized understanding.  Refill prescriptions have been provided.  Subjective and objective measures of pulmonary function will be followed and the treatment plan will be adjusted accordingly.

## 2018-05-22 NOTE — Patient Instructions (Addendum)
Moderate persistent asthma Stable.  For now, continue Flovent (fluticasone) 220 g, 2 inhalations twice a day, Incruse Ellipta 1 inhalation daily, montelukast 10 mg daily bedtime, and albuterol every 6 hours if needed.  I have encouraged consistent use of spacer device with HFA inhalers.  She has verbalized understanding.  Refill prescriptions have been provided.  Subjective and objective measures of pulmonary function will be followed and the treatment plan will be adjusted accordingly.  Perennial allergic rhinitis  Continue appropriate allergen avoidance measures.  Discontinue carbinoxamine maleate.    A prescription has been provided for azelastine nasal spray, 1-2 sprays per nostril 2 times daily as needed.   A prescription has been provided for fluticasone nasal spray, one spray per nostril 1-2 times daily as needed.  Nasal saline spray (i.e., Simply Saline) or nasal saline lavage (i.e., NeilMed) is recommended as needed and prior to medicated nasal sprays.   If allergen avoidance measures and medications fail to adequately relieve symptoms, aeroallergen immunotherapy will be considered.   Return in about 3 months (around 08/21/2018), or if symptoms worsen or fail to improve.

## 2018-05-22 NOTE — Assessment & Plan Note (Signed)
   Continue appropriate allergen avoidance measures.  Discontinue carbinoxamine maleate.    A prescription has been provided for azelastine nasal spray, 1-2 sprays per nostril 2 times daily as needed.   A prescription has been provided for fluticasone nasal spray, one spray per nostril 1-2 times daily as needed.  Nasal saline spray (i.e., Simply Saline) or nasal saline lavage (i.e., NeilMed) is recommended as needed and prior to medicated nasal sprays.   If allergen avoidance measures and medications fail to adequately relieve symptoms, aeroallergen immunotherapy will be considered.

## 2018-05-22 NOTE — Progress Notes (Signed)
Follow-up Telemedicine Note  RE: [name] MRN: [id number] DOB: May 31, 1955 Date of Telemedicine Visit: 05/22/2018  Primary care provider: Martinique, Betty G, MD Referring provider: Martinique, Betty G, MD  Telemedicine Follow Up Visit via Telephone: I connected with Dagny Fiorentino for a follow up on 05/22/18 by telephone and verified that I am speaking with the correct person using two identifiers.   The limitations, risks, security and privacy concerns of performing an evaluation and management service by telemedicine, the availability of in person appointments, and that there may be a patient responsible charge related to this service were discussed. The patient expressed understanding and agreed to proceed.  Patient is at home accompanied.  Provider is at the office.  Visit start time: 9:53 am Visit end time: 10:18 am Insurance consent/check in by: Lelan Pons Medical consent and medical assistant/nurse: Nira Conn  History of present illness: [name] is a 63 y.o. female with persistent asthma, allergic rhinoconjunctivitis, and history of persistent cough presenting today for telephone follow-up visit.  She was last seen in this clinic in August 2019.  She reports that in the interval since her previous visit she has been "overall doing okay."  She notes that the carbinoxamine has not provided perceived benefit therefore she discontinued this medication.  She has been experiencing sneezing, sniffling, and occasional maxillary sinus pressure.  For unclear reasons she did not have refills for medicated nasal sprays and therefore has been going without nasal sprays for the past few months.  She reports that she has required albuterol rescue 1-2 times per month on average in the interval since her previous visit, typically related to coughing spells.  The use of albuterol provides adequate temporary relief with coughing.  She is currently taking Flovent 220 g, 2 inhalations twice daily, and  montelukast 10 mg daily at bedtime.  She admits that she has not been using a spacer device since November.  Assessment and plan: Moderate persistent asthma Stable.  For now, continue Flovent (fluticasone) 220 g, 2 inhalations twice a day, Incruse Ellipta 1 inhalation daily, montelukast 10 mg daily bedtime, and albuterol every 6 hours if needed.  I have encouraged consistent use of spacer device with HFA inhalers.  She has verbalized understanding.  Refill prescriptions have been provided.  Subjective and objective measures of pulmonary function will be followed and the treatment plan will be adjusted accordingly.  Perennial allergic rhinitis  Continue appropriate allergen avoidance measures.  Discontinue carbinoxamine maleate.    A prescription has been provided for azelastine nasal spray, 1-2 sprays per nostril 2 times daily as needed.   A prescription has been provided for fluticasone nasal spray, one spray per nostril 1-2 times daily as needed.  Nasal saline spray (i.e., Simply Saline) or nasal saline lavage (i.e., NeilMed) is recommended as needed and prior to medicated nasal sprays.   If allergen avoidance measures and medications fail to adequately relieve symptoms, aeroallergen immunotherapy will be considered.   Meds ordered this encounter  Medications  . Azelastine HCl 0.15 % SOLN    Sig: Place 1-2 sprays into both nostrils 2 (two) times daily.    Dispense:  30 mL    Refill:  5  . fluticasone (FLONASE) 50 MCG/ACT nasal spray    Sig: Place 1 spray into both nostrils 2 (two) times daily as needed for allergies or rhinitis.    Dispense:  16 g    Refill:  5    Diagnostics: None.   Physical examination: Physical Exam Not  obtained as encounter was done via telephone.   The following portions of the patient's history were reviewed and updated as appropriate: allergies, current medications, past family history, past medical history, past social history, past  surgical history and problem list.  Allergies as of 05/22/2018      Reactions   Indomethacin Other (See Comments)   Muscle spasms Causes muscle spasms in neck   Amlodipine Swelling   Peripheral edema   Carbamazepine Other (See Comments)   Easy and unusual bleeding   Pseudoephedrine Other (See Comments)   I fly off the walls  CANNOT SLEEP   Risperidone And Related Other (See Comments)   Stomach upset, insomnia, drooling, tremors, "jerks," sensitivity to touch.    Bupivacaine Hives   Pentazocine Other (See Comments)   Unknown reaction MADE ME "LACTATE"   Propoxyphene Itching, Nausea And Vomiting   darvocet   Sulfa Antibiotics Other (See Comments)   Unknown reaction   Tramadol Other (See Comments)   Patient can not remember   Aspirin Other (See Comments)   upset stomach, ringing in the ears   Codeine Itching, Other (See Comments)   Anything related to codeine   Etodolac Rash, Other (See Comments)   Ivp Dye [iodinated Diagnostic Agents] Itching   Flushed and Fever, itch all over   Propranolol Nausea Only, Other (See Comments)      Medication List       Accurate as of May 22, 2018 11:38 AM. Always use your most recent med list.        albuterol (2.5 MG/3ML) 0.083% nebulizer solution Commonly known as:  PROVENTIL TAKE 3 MLS BY NEBULIZATION EVERY 6 HOURS AS NEEDED FOR WHEEZING OR SHORTNESS OF BREATH.   albuterol 108 (90 Base) MCG/ACT inhaler Commonly known as:  PROVENTIL HFA;VENTOLIN HFA INHALE 1-2 PUFFS INTO THE LUNGS DAILY AS NEEDED FOR WHEEZING OR SHORTNESS OF BREATH.   alendronate 70 MG tablet Commonly known as:  FOSAMAX TAKE 1 TABLET BY MOUTH EVERY 7 DAYS WITH FULL GLASS OF WATER ON EMPTY STOMACH   ARIPiprazole 5 MG tablet Commonly known as:  ABILIFY Take 5 mg by mouth daily.   Azelastine HCl 0.15 % Soln Place 1-2 sprays into both nostrils 2 (two) times daily.   Carbinoxamine Maleate 4 MG Tabs TAKE 1 TABLET BY MOUTH EVERY 8 HOURS AS NEEDED   clonazePAM  0.5 MG tablet Commonly known as:  KLONOPIN TAKE 1 TABLET DAILY AS NEEDED FOR PANIC   [facility] D3 125 MCG (5000 UT) capsule Generic drug:  Cholecalciferol TAKE 1 CAPSULE (5,000 UNITS TOTAL) BY MOUTH DAILY.   Elmiron 100 MG capsule Generic drug:  pentosan polysulfate Take 100 mg by mouth 2 (two) times daily.   fluticasone 220 MCG/ACT inhaler Commonly known as:  Flovent HFA TAKE 2 PUFFS BY MOUTH TWICE A DAY   fluticasone 50 MCG/ACT nasal spray Commonly known as:  FLONASE Place 1 spray into both nostrils 2 (two) times daily as needed for allergies or rhinitis.   hydrocortisone cream 1 % Apply 1 application topically 2 (two) times daily as needed for itching.   levothyroxine 50 MCG tablet Commonly known as:  SYNTHROID, LEVOTHROID TAKE 1 TABLET EVERY DAY--- TAKES IN AM   metoprolol tartrate 25 MG tablet Commonly known as:  LOPRESSOR TAKE 1 TABLET BY MOUTH TWICE A DAY   montelukast 10 MG tablet Commonly known as:  SINGULAIR Take 1 tablet (10 mg total) by mouth at bedtime.   rizatriptan 10 MG disintegrating tablet Commonly known as:  MAXALT-MLT TAKE 1 TABLET EVERY DAY AS NEEDED FOR MIGRAINE, MAY REPEAT IN 2 HRS IF NEEDED. MAX 2 DOSES/WK   rosuvastatin 20 MG tablet Commonly known as:  CRESTOR TAKE 1 TABLET BY MOUTH EVERY DAY   sertraline 100 MG tablet Commonly known as:  ZOLOFT Take 100 mg by mouth at bedtime.   umeclidinium bromide 62.5 MCG/INH Aepb Commonly known as:  INCRUSE ELLIPTA Inhale 1 puff into the lungs daily.       Allergies  Allergen Reactions  . Indomethacin Other (See Comments)    Muscle spasms Causes muscle spasms in neck  . Amlodipine Swelling    Peripheral edema  . Carbamazepine Other (See Comments)    Easy and unusual bleeding   . Pseudoephedrine Other (See Comments)    I fly off the walls  CANNOT SLEEP  . Risperidone And Related Other (See Comments)    Stomach upset, insomnia, drooling, tremors, "jerks," sensitivity to touch.   . Bupivacaine  Hives  . Pentazocine Other (See Comments)    Unknown reaction MADE ME "LACTATE"  . Propoxyphene Itching and Nausea And Vomiting    darvocet  . Sulfa Antibiotics Other (See Comments)    Unknown reaction  . Tramadol Other (See Comments)    Patient can not remember  . Aspirin Other (See Comments)    upset stomach, ringing in the ears  . Codeine Itching and Other (See Comments)    Anything related to codeine  . Etodolac Rash and Other (See Comments)  . Ivp Dye [Iodinated Diagnostic Agents] Itching    Flushed and Fever, itch all over  . Propranolol Nausea Only and Other (See Comments)   Review of systems: Review of systems negative except as noted in HPI / PMHx or noted below: Constitutional: Negative.  HENT: Negative.   Eyes: Negative.  Respiratory: Negative.   Cardiovascular: Negative.  Gastrointestinal: Negative.  Genitourinary: Negative.  Musculoskeletal: Negative.  Neurological: Negative.  Endo/Heme/Allergies: Negative.  Cutaneous: Negative.  Past Medical History:  Diagnosis Date  . Allergic rhinitis   . Anxiety   . Benign essential tremor    hands  . Bipolar 1 disorder, mixed, moderate ([place])   . Claustrophobia   . Depression   . Eczema   . Frequency of urination   . History of frequent urinary tract infections   . History of gastroesophageal reflux (GERD)    05-25-2017  per pt no issues since hiatal hernia repair 01/ 2018  . History of hiatal hernia   . History of melanoma excision    early 2000s-- BACK  . History of panic attacks   . 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 Hypothyroidism   . Intermittent palpitations    cardiology--  dr Martinique  . Interstitial cystitis   . Limited jaw range of motion    s/p  bilateral TMJ surgery,  age 50s  . Migraines   . Moderate persistent asthma    pulmologist-- dr Halford Chessman  . OA (osteoarthritis)    both knees  . OSA on CPAP    per last sleep study 09/ 2017  mild OSA, AHI13/hr  . PONV  (postoperative nausea and vomiting)   . PVC (premature ventricular contraction)   . Rosacea   . Sensation of pressure in bladder area    per pt intermittant  . TMJ arthralgia   . Urticaria   . Wears glasses   . White coat syndrome without hypertension    05-25-2017  per pt  hx hypertension yrs ago, no issues after quitting stressful job    Family History  Problem Relation Age of Onset  . Hypertension Mother        deceased from MVA complications  . Thyroid disease Mother   . Allergic rhinitis Mother   . Testicular cancer Father   . Allergic rhinitis Father   . Colon polyps Sister   . Coronary artery disease Unknown     Social History   Socioeconomic History  . Marital status: Divorced    Spouse name: Not on file  . Number of children: 0  . Years of education: Not on file  . Highest education level: Not on file  Occupational History  . Not on file  Social Needs  . Financial resource strain: Not on file  . Food insecurity:    Worry: Not on file    Inability: Not on file  . Transportation needs:    Medical: Not on file    Non-medical: Not on file  Tobacco Use  . Smoking status: Never Smoker  . Smokeless tobacco: Never Used  Substance and Sexual Activity  . Alcohol use: Yes    Alcohol/week: 0.0 standard drinks    Comment: once every 3-4 months  . Drug use: No  . Sexual activity: Not on file  Lifestyle  . Physical activity:    Days per week: Not on file    Minutes per session: Not on file  . Stress: Not on file  Relationships  . Social connections:    Talks on phone: Not on file    Gets together: Not on file    Attends religious service: Not on file    Active member of club or organization: Not on file    Attends meetings of clubs or organizations: Not on file    Relationship status: Not on file  . Intimate partner violence:    Fear of current or ex partner: Not on file    Emotionally abused: Not on file    Physically abused: Not on file    Forced sexual  activity: Not on file  Other Topics Concern  . Not on file  Social History Narrative  . Not on file     Previous notes and tests were reviewed.  I discussed the assessment and treatment plan with the patient. The patient was provided an opportunity to ask questions and all were answered. The patient agreed with the plan and demonstrated an understanding of the instructions.   The patient was advised to call back or seek an in-person evaluation if the symptoms worsen or if the condition fails to improve as anticipated.  I provided 25 minutes of non-face-to-face time during this encounter.  I appreciate the opportunity to take part in Shikha's care. Please do not hesitate to contact me with questions.  Sincerely,   R. Edgar Frisk, MD

## 2018-05-25 ENCOUNTER — Other Ambulatory Visit: Payer: Self-pay | Admitting: Family Medicine

## 2018-05-25 DIAGNOSIS — F3111 Bipolar disorder, current episode manic without psychotic features, mild: Secondary | ICD-10-CM

## 2018-05-30 ENCOUNTER — Other Ambulatory Visit: Payer: Self-pay | Admitting: Allergy and Immunology

## 2018-05-31 ENCOUNTER — Other Ambulatory Visit: Payer: Self-pay | Admitting: Family Medicine

## 2018-05-31 DIAGNOSIS — I493 Ventricular premature depolarization: Secondary | ICD-10-CM

## 2018-05-31 DIAGNOSIS — I10 Essential (primary) hypertension: Secondary | ICD-10-CM

## 2018-06-03 DIAGNOSIS — J4520 Mild intermittent asthma, uncomplicated: Secondary | ICD-10-CM | POA: Diagnosis not present

## 2018-06-05 DIAGNOSIS — R69 Illness, unspecified: Secondary | ICD-10-CM | POA: Diagnosis not present

## 2018-06-05 DIAGNOSIS — F411 Generalized anxiety disorder: Secondary | ICD-10-CM | POA: Diagnosis not present

## 2018-06-05 DIAGNOSIS — F410 Panic disorder [episodic paroxysmal anxiety] without agoraphobia: Secondary | ICD-10-CM | POA: Diagnosis not present

## 2018-06-09 ENCOUNTER — Ambulatory Visit: Payer: Self-pay

## 2018-06-21 DIAGNOSIS — G4733 Obstructive sleep apnea (adult) (pediatric): Secondary | ICD-10-CM | POA: Diagnosis not present

## 2018-06-29 DIAGNOSIS — R69 Illness, unspecified: Secondary | ICD-10-CM | POA: Diagnosis not present

## 2018-07-03 DIAGNOSIS — J4520 Mild intermittent asthma, uncomplicated: Secondary | ICD-10-CM | POA: Diagnosis not present

## 2018-07-04 ENCOUNTER — Other Ambulatory Visit: Payer: Self-pay | Admitting: Family Medicine

## 2018-07-04 DIAGNOSIS — I10 Essential (primary) hypertension: Secondary | ICD-10-CM

## 2018-07-04 DIAGNOSIS — I493 Ventricular premature depolarization: Secondary | ICD-10-CM

## 2018-07-05 DIAGNOSIS — R69 Illness, unspecified: Secondary | ICD-10-CM | POA: Diagnosis not present

## 2018-07-05 DIAGNOSIS — F410 Panic disorder [episodic paroxysmal anxiety] without agoraphobia: Secondary | ICD-10-CM | POA: Diagnosis not present

## 2018-07-05 DIAGNOSIS — F411 Generalized anxiety disorder: Secondary | ICD-10-CM | POA: Diagnosis not present

## 2018-07-10 ENCOUNTER — Other Ambulatory Visit: Payer: Self-pay | Admitting: Family Medicine

## 2018-07-10 DIAGNOSIS — E039 Hypothyroidism, unspecified: Secondary | ICD-10-CM

## 2018-07-11 DIAGNOSIS — R69 Illness, unspecified: Secondary | ICD-10-CM | POA: Diagnosis not present

## 2018-07-17 ENCOUNTER — Other Ambulatory Visit: Payer: Self-pay | Admitting: Family Medicine

## 2018-07-17 DIAGNOSIS — E559 Vitamin D deficiency, unspecified: Secondary | ICD-10-CM

## 2018-07-20 DIAGNOSIS — R69 Illness, unspecified: Secondary | ICD-10-CM | POA: Diagnosis not present

## 2018-07-21 DIAGNOSIS — G4733 Obstructive sleep apnea (adult) (pediatric): Secondary | ICD-10-CM | POA: Diagnosis not present

## 2018-07-22 DIAGNOSIS — G4733 Obstructive sleep apnea (adult) (pediatric): Secondary | ICD-10-CM | POA: Diagnosis not present

## 2018-07-27 ENCOUNTER — Other Ambulatory Visit: Payer: Self-pay | Admitting: Family Medicine

## 2018-07-27 DIAGNOSIS — I10 Essential (primary) hypertension: Secondary | ICD-10-CM

## 2018-07-27 DIAGNOSIS — I493 Ventricular premature depolarization: Secondary | ICD-10-CM

## 2018-07-31 ENCOUNTER — Telehealth: Payer: Self-pay | Admitting: Family Medicine

## 2018-07-31 DIAGNOSIS — I10 Essential (primary) hypertension: Secondary | ICD-10-CM

## 2018-07-31 DIAGNOSIS — I493 Ventricular premature depolarization: Secondary | ICD-10-CM

## 2018-07-31 MED ORDER — METOPROLOL TARTRATE 25 MG PO TABS: 25.0000 mg | ORAL_TABLET | Freq: Two times a day (BID) | ORAL | 1 refills | Status: DC

## 2018-07-31 NOTE — Telephone Encounter (Signed)
REFILL metoprolol tartrate (LOPRESSOR) 25 MG tablet  [facility]ARM[facility]Y [id number] - [place], [place] - [address] [phone] (Phone) [phone] (Fax)

## 2018-08-02 DIAGNOSIS — R69 Illness, unspecified: Secondary | ICD-10-CM | POA: Diagnosis not present

## 2018-08-03 ENCOUNTER — Other Ambulatory Visit: Payer: Self-pay | Admitting: Allergy and Immunology

## 2018-08-03 DIAGNOSIS — J4520 Mild intermittent asthma, uncomplicated: Secondary | ICD-10-CM | POA: Diagnosis not present

## 2018-08-03 NOTE — Telephone Encounter (Signed)
Courtesy refill  

## 2018-08-15 DIAGNOSIS — R69 Illness, unspecified: Secondary | ICD-10-CM | POA: Diagnosis not present

## 2018-08-17 DIAGNOSIS — Z96652 Presence of left artificial knee joint: Secondary | ICD-10-CM | POA: Diagnosis not present

## 2018-08-17 DIAGNOSIS — Z471 Aftercare following joint replacement surgery: Secondary | ICD-10-CM | POA: Diagnosis not present

## 2018-08-21 ENCOUNTER — Other Ambulatory Visit: Payer: Self-pay

## 2018-08-21 ENCOUNTER — Encounter: Payer: Self-pay | Admitting: Allergy and Immunology

## 2018-08-21 ENCOUNTER — Ambulatory Visit: Payer: Medicare HMO | Admitting: Allergy and Immunology

## 2018-08-21 VITALS — BP 118/72 | HR 70 | Temp 98.2°F | Resp 16 | Ht 59.0 in | Wt 140.2 lb

## 2018-08-21 DIAGNOSIS — J4540 Moderate persistent asthma, uncomplicated: Secondary | ICD-10-CM | POA: Diagnosis not present

## 2018-08-21 DIAGNOSIS — G4733 Obstructive sleep apnea (adult) (pediatric): Secondary | ICD-10-CM | POA: Diagnosis not present

## 2018-08-21 DIAGNOSIS — J3089 Other allergic rhinitis: Secondary | ICD-10-CM

## 2018-08-21 DIAGNOSIS — R490 Dysphonia: Secondary | ICD-10-CM

## 2018-08-21 DIAGNOSIS — R69 Illness, unspecified: Secondary | ICD-10-CM | POA: Diagnosis not present

## 2018-08-21 MED ORDER — FLUTICASONE PROPIONATE 50 MCG/ACT NA SUSP: 1.0000 | Freq: Two times a day (BID) | NASAL | 5 refills | Status: DC | PRN

## 2018-08-21 MED ORDER — ALBUTEROL SULFATE HFA 108 (90 BASE) MCG/ACT IN AERS: 1.0000 | INHALATION_SPRAY | Freq: Every day | RESPIRATORY_TRACT | 0 refills | Status: DC | PRN

## 2018-08-21 MED ORDER — MONTELUKAST SODIUM 10 MG PO TABS: 10.0000 mg | ORAL_TABLET | Freq: Every day | ORAL | 3 refills | Status: DC

## 2018-08-21 MED ORDER — INCRUSE ELLIPTA 62.5 MCG/INH IN AEPB: 1.0000 | INHALATION_SPRAY | Freq: Every day | RESPIRATORY_TRACT | 5 refills | Status: DC

## 2018-08-21 MED ORDER — FLOVENT HFA 220 MCG/ACT IN AERO: INHALATION_SPRAY | RESPIRATORY_TRACT | 3 refills | Status: DC

## 2018-08-21 MED ORDER — AZELASTINE HCL 0.15 % NA SOLN: 1.0000 | Freq: Two times a day (BID) | NASAL | 3 refills | Status: DC

## 2018-08-21 MED ORDER — ALBUTEROL SULFATE (2.5 MG/3ML) 0.083% IN NEBU: INHALATION_SOLUTION | RESPIRATORY_TRACT | 1 refills | Status: DC

## 2018-08-21 NOTE — Assessment & Plan Note (Addendum)
   Continue appropriate allergen avoidance measures, azelastine nasal spray as needed, fluticasone nasal spray as needed, and montelukast 10 mg daily.  Nasal saline spray (i.e., Simply Saline) or nasal saline lavage (i.e., NeilMed) is recommended as needed and prior to medicated nasal sprays.  For thick post nasal drainage, add guaifenesin [phone] mg (Mucinex)  twice daily as needed with adequate hydration as discussed.  If allergen avoidance measures and medications fail to adequately relieve symptoms, aeroallergen immunotherapy will be considered.

## 2018-08-21 NOTE — Progress Notes (Signed)
Subjective:   [name] was seen in consultation in the movement disorder clinic at the request of [place], Malka So, MD.  The evaluation is for tremor.  The patient is a 63 y.o. left handed female with a history of tremor.  Pt reports that tremor started many years ago; she recalls that intermittently throughout her life she would be tremulous when she was stressed or singing in crowds or doing pageants as a child.  However, since she has gotten older it has gotten worse and even if she is the "least bit anxious" she will be tremulous and it may last for days.  She states that some days she will be more tremulous more than others.  There is no known family hx of tremor (thinks that sister may but she doesn't know).  States that her paternal GM had PD.  She was admitted to psychiatry at the end of 2013-2014 to behavioral health and c/o tremor.  She was told that her tremor was due to stress/psychogenic according to the patient.  I cannot find notes about this but did find notes about tremor.  Affected by caffeine:  Unknown (rarely drinks caffeine) Affected by alcohol:  Rarely drinks Affected by stress:  Yes.   Affected by fatigue:  No., not unless very fatigued Spills soup if on spoon:  Yes.   (and has trouble eating peas) Spills glass of liquid if full:  Yes.  , if glass is too full Affects ADL's (tying shoes, brushing teeth, etc):  No., although will note tremor with zipping zippers and with measuring food when baking)  Current/Previously tried tremor medications: primidone (put on it by PA at [facility] per the patient - helped sometimes but not all the time - ran out of the medication and so is now off of it)  Current medications that may exacerbate tremor:  Lithium/VPA/geodon/albuterol  (albuterol will make her tremor so doesn't use it often; been on lithium since 1987; been on geodon for 3 months for hallucinations - visual and auditory)  07/08/17 update: Patient is  seen today in follow-up.  I have not seen her since 2017.  At that point, she was on multiple medications that can exacerbate tremor.  She was started on propranolol as needed.  I did ask her to follow-up with me in 6 months, but she did not return until today.  Records are reviewed.  She is off of lithium, but she is still on Geodon. Didn't notice a difference when getting off of lithium.  She states that the tremors interfere with her doing things.  She can awaken with them.  She believes she had "a problem"  with propranolol but cannot recall what the SE was.  Trouble eating, writing.   On primidone years ago and it seemed to help some, but when she ran out of it she did not take it any longer.  She recently saw the nurse practitioner at the pulmonary practice and was noncompliant with CPAP at the time.  11/08/17 update: Patient follows up today for tremor.  She was restarted back on primidone, 50 mg twice per day last visit.  She reports that she is doing much better; "I am so relieved."  Remains on Geodon.  Records are reviewed since our last visit.  She is scheduled for knee surgery on December 26, 2017.  Had cat bite and scratch her.  Saw PCP yesterday and on abx now.  02/23/18 update: Patient is seen back much  earlier than follow-up than anticipated.  This patient is accompanied in the office by her sister who supplements the history.  She is seen now for posthospitalization follow-up for weakness.  She was first in the hospital in November for a knee replacement.  She was discharged on November 21.  She returned right back to the hospital on December 9 because of trouble walking and weakness.  She was seen by neurology and psychiatry.  Discharge diagnoses included bipolar disorder as well as serotonin syndrome (felt less likely).  When I last saw her, I thought she was mildly parkinsonian from [place].  That Geodon was discontinued 1 week prior to going in the hospital because of the concerns about  parkinsonism (pt doesn't recall this at all).  She is supposed to be on primidone 50 mg bid, but reports she ran out of it but cannot tell me when.  She [facility] and stayed about 21 days.  She did well with that.  She is "weaning herself" off of the walker.   Sister states that her upper body got better faster than the lower body.  The records that were made available to me were reviewed (took 35 min in addition to speaking to PA neurohosp on the phone about the patient).  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  She has been off of Geodon since December but is now on abilify.  States that she was put on this 2-3 months ago.  Balance has been good - "its been better since before my knee surgery."  "seldom has tremor."   Cramping in toes is better.   Medical records have been reviewed since our last visit.  Last saw primary care on April 28, 2018.  She has also been following with allergy.  Outside reports reviewed: historical medical records, lab reports and referral letter/letters.  Allergies  Allergen Reactions  . Emetrol Itching  . Indomethacin Other (See Comments)    Muscle spasms Causes muscle spasms in neck  . Iodinated Diagnostic Agents Itching    Flushed and Fever, itch all over  . Amlodipine Swelling    Peripheral edema  . Carbamazepine Other (See Comments)    Easy and unusual bleeding   . Pseudoephedrine Other (See Comments)    I fly off the walls  CANNOT SLEEP  . Risperidone And Related Other (See Comments)    Stomach upset, insomnia, drooling, tremors, "jerks," sensitivity to touch.   . Bupivacaine Hives  . Pentazocine Other (See Comments)    Unknown reaction MADE ME "LACTATE"  . Propoxyphene Itching and Nausea And Vomiting    darvocet  . Sulfa Antibiotics Other (See Comments)    Unknown reaction  . Tramadol Other (See Comments)    Patient can not remember  . Aspirin Other (See Comments)    upset  stomach, ringing in the ears  . Codeine Itching and Other (See Comments)    Anything related to codeine  . Etodolac Rash and Other (See Comments)  . Propranolol Nausea Only and Other (See Comments)    Outpatient Encounter Medications as of 08/24/2018  Medication Sig  . albuterol (PROVENTIL) (2.5 MG/3ML) 0.083% nebulizer solution TAKE 3 MLS BY NEBULIZATION EVERY 6 HOURS AS NEEDED FOR WHEEZING OR SHORTNESS OF BREATH.  Marland Kitchen albuterol (VENTOLIN HFA) 108 (90 Base) MCG/ACT inhaler Inhale 1-2 puffs into the lungs daily as needed for wheezing or shortness of breath.  Marland Kitchen alendronate (FOSAMAX) 70 MG tablet TAKE  1 TABLET BY MOUTH EVERY 7 DAYS WITH FULL GLASS OF WATER ON EMPTY STOMACH  . ARIPiprazole (ABILIFY) 5 MG tablet Take 5 mg by mouth daily.  . Azelastine HCl 0.15 % SOLN Place 1-2 sprays into both nostrils 2 (two) times daily.  . clonazePAM (KLONOPIN) 0.5 MG tablet TAKE 1 TABLET DAILY AS NEEDED FOR PANIC  . [employer] D3 125 MCG (5000 UT) capsule TAKE 1 CAPSULE (5,000 UNITS TOTAL) BY MOUTH DAILY.  Marland Kitchen ELMIRON 100 MG capsule Take 100 mg by mouth 2 (two) times daily.   . fluticasone (FLONASE) 50 MCG/ACT nasal spray Place 1 spray into both nostrils 2 (two) times daily as needed for allergies or rhinitis.  . fluticasone (FLOVENT HFA) 220 MCG/ACT inhaler TAKE 2 PUFFS BY MOUTH TWICE A DAY  . hydrocortisone cream 1 % Apply 1 application topically 2 (two) times daily as needed for itching.   . levothyroxine (SYNTHROID) 50 MCG tablet TAKE 1 TABLET EVERY DAY--- TAKES IN AM  . metoprolol tartrate (LOPRESSOR) 25 MG tablet Take 1 tablet (25 mg total) by mouth 2 (two) times daily.  . montelukast (SINGULAIR) 10 MG tablet Take 1 tablet (10 mg total) by mouth at bedtime.  . rizatriptan (MAXALT-MLT) 10 MG disintegrating tablet TAKE 1 TABLET EVERY DAY AS NEEDED FOR MIGRAINE, MAY REPEAT IN 2 HRS IF NEEDED. MAX 2 DOSES/WK  . rosuvastatin (CRESTOR) 20 MG tablet TAKE 1 TABLET BY MOUTH EVERY DAY  . sertraline (ZOLOFT) 100 MG tablet  Take 100 mg by mouth at bedtime.   Marland Kitchen umeclidinium bromide (INCRUSE ELLIPTA) 62.5 MCG/INH AEPB Inhale 1 puff into the lungs daily.  Marland Kitchen zolpidem (AMBIEN) 5 MG tablet TAKE 1 TABLET BY MOUTH EVERYDAY AT BEDTIME   No facility-administered encounter medications on file as of 08/24/2018.     Past Medical History:  Diagnosis Date  . Allergic rhinitis   . Anxiety   . Benign essential tremor    hands  . Bipolar 1 disorder, mixed, moderate ([place])   . Claustrophobia   . Depression   . Eczema   . Frequency of urination   . History of frequent urinary tract infections   . History of gastroesophageal reflux (GERD)    05-25-2017  per pt no issues since hiatal hernia repair 01/ 2018  . History of hiatal hernia   . History of melanoma excision    early 2000s-- BACK  . History of panic attacks   . 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 Hypothyroidism   . Intermittent palpitations    cardiology--  dr [place]  . Interstitial cystitis   . Limited jaw range of motion    s/p  bilateral TMJ surgery,  age 64s  . Migraines   . Moderate persistent asthma    pulmologist-- dr Halford Chessman  . OA (osteoarthritis)    both knees  . OSA on CPAP    per last sleep study 09/ 2017  mild OSA, AHI13/hr  . PONV (postoperative nausea and vomiting)   . PVC (premature ventricular contraction)   . Rosacea   . Sensation of pressure in bladder area    per pt intermittant  . TMJ arthralgia   . Urticaria   . Wears glasses   . White coat syndrome without hypertension    05-25-2017  per pt hx hypertension yrs ago, no issues after quitting stressful job    Past Surgical History:  Procedure Laterality Date  . [place] STUDY N/A 09/22/2015  Procedure: [place] STUDY;  Surgeon: Doran Stabler, MD;  Location: [facility];  Service: Gastroenterology;  Laterality: N/A;  . ADENOIDECTOMY    . ANAL FISSURE REPAIR  age 38  (31)  . BREAST EXCISIONAL BIOPSY Right 04-16-2003  dr p. young  [place]    benign  . BUNIONECTOMY Right early 2000s  . CARPAL TUNNEL RELEASE Left age 50 (20)  . CHOLECYSTECTOMY OPEN  age 29  . CYSTO WITH HYDRODISTENSION N/A 06/02/2017   Procedure: CYSTOSCOPY/HYDRODISTENSION OF BLADDER;  Surgeon: Irine Seal, MD;  Location: [facility];  Service: Urology;  Laterality: N/A;  . CYSTO/ HYDRODISTENTION/  INSTILLATION THERAPY  1990s  . DX LAPAROSCOPY/  DX HYSTEROSCOPY/  D & C  08-07-2007   dr dove  [facility]  . ENDOMETRIAL ABLATION W/ NOVASURE  10-21-2008   dr Harolyn Rutherford  [facility]  . ESOPHAGEAL MANOMETRY N/A 09/22/2015   Procedure: ESOPHAGEAL MANOMETRY (EM);  Surgeon: Doran Stabler, MD;  Location: [facility];  Service: Gastroenterology;  Laterality: N/A;  with impedence   . FINGER ARTHROPLASTY Bilateral    left little finger 2016:  right middle finger 06/ 2018  . KNEE ARTHROSCOPY Bilateral x3 left ;  x3  right-- last one age 28 (25)  . NASAL SEPTUM SURGERY  age 86s  . ROBOT ASSISTED REDUCTION PARAESOPHAGEAL HIATAL HERNIA/ TYPE 2 MEDIASTINAL DISSECTION/ PRIMARY HIATAL HERNIA REPAIR/ ANTERIOR & POSTERIOR GASTROPEXY/ NISSEN FUNDOPLICATION  [id number]   DR GROSS  [facility]  . SINOSCOPY    . TEMPOROMANDIBULAR JOINT SURGERY Bilateral x3  -- age 71s   per pt used graft  . TONSILLECTOMY    . TOTAL KNEE ARTHROPLASTY Left 12/26/2017   Procedure: LEFT TOTAL KNEE ARTHROPLASTY;  Surgeon: Gaynelle Arabian, MD;  Location: [facility];  Service: Orthopedics;  Laterality: Left;  56min  . TRANSTHORACIC ECHOCARDIOGRAM  12-06-2017   dr [place]   ef 55-60%, grade 1 diastoilc dysfunction, trivial MR  . TRIGGER FINGER RELEASE Bilateral last one 2017   several release's bilaterally  . ULNAR NERVE TRANSPOSITION Bilateral age 37 (12)    Social History   Socioeconomic History  . Marital status: Divorced    Spouse name: Not on file  . Number of children: 0  . Years of education: Not on file  . Highest education level: Not on file  Occupational History  . Not on file  Social Needs  .  Financial resource strain: Not on file  . Food insecurity    Worry: Not on file    Inability: Not on file  . Transportation needs    Medical: Not on file    Non-medical: Not on file  Tobacco Use  . Smoking status: Never Smoker  . Smokeless tobacco: Never Used  Substance and Sexual Activity  . Alcohol use: Yes    Alcohol/week: 0.0 standard drinks    Comment: once every 3-4 months  . Drug use: No  . Sexual activity: Not on file  Lifestyle  . Physical activity    Days per week: Not on file    Minutes per session: Not on file  . Stress: Not on file  Relationships  . Social Herbalist on phone: Not on file    Gets together: Not on file    Attends religious service: Not on file    Active member of club or organization: Not on file    Attends meetings of clubs or organizations: Not on file    Relationship status: Not on  file  . Intimate partner violence    Fear of current or ex partner: Not on file    Emotionally abused: Not on file    Physically abused: Not on file    Forced sexual activity: Not on file  Other Topics Concern  . Not on file  Social History Narrative  . Not on file    Family Status  Relation Name Status  . Mother  Deceased       HTN, thyroid  . Father  Deceased       testicular cancer  . Sister sally Alive       colon polyps  . Other  (Not Specified)    Review of Systems Review of Systems  Constitutional: Negative.   HENT: Negative.   Eyes: Negative.   Respiratory: Negative.   Cardiovascular: Negative.   Gastrointestinal: Negative.   Genitourinary: Negative.   Skin: Negative.      Objective:   VITALS:   Vitals:   08/24/18 0913  BP: 124/70  Pulse: 87  Temp: 99.3 F (37.4 C)  SpO2: 95%  Weight: 140 lb 6.4 oz (63.7 kg)  Height: 5' (1.524 m)    GEN:  The patient appears stated age and is in NAD.  She is anxious. HEENT:  Normocephalic, atraumatic.  The mucous membranes are moist. The superficial temporal arteries are without  ropiness or tenderness. CV:  RRR Lungs:  CTAB Neck/HEME:  There are no carotid bruits bilaterally.  Neurological examination:  Orientation: The patient is alert and oriented x3. Cranial nerves: There is good facial symmetry. The speech is fluent and clear. Soft palate rises symmetrically and there is no tongue deviation. Hearing is intact to conversational tone. Sensation: Sensation is intact to light touch throughout Motor: Strength is 5/5 in the bilateral upper and lower extremities.   Shoulder shrug is equal and symmetric.  There is no pronator drift.  Movement examination: Tone: There is normal tone Abnormal movements: tremulousness can be felt in the bilateral UE but not seen even with distraction Coordination:  There is good RAM's Gait and Station: The patient is able to arise without the use of the hands.  She is short stepped with decreased arm swing bilaterally, L more than R.       Movement examination: Tone: There is normal tone in UE/LE Abnormal movements: Minimal tremor of the outstretched hands.  This does not change with intention.  Archimedes spirals are actually drawn quite well today.  She only spills a little bit of water when asked to pour from one glass to another. Coordination:  There is no decremation with RAM's, with any form of RAMS, including alternating supination and pronation of the forearm, hand opening and closing, finger taps, heel taps and toe taps. Gait and Station: The patient has no difficulty arising out of a deep-seated chair without the use of the hands.  She is stooped slightly at the waist.  She is short stepped.  Lab Results  Component Value Date   TSH 0.54 04/28/2018     Chemistry      Component Value Date/Time   NA 138 01/23/2018 0550   K 3.7 01/23/2018 0550   CL 100 01/23/2018 0550   CO2 27 01/23/2018 0550   BUN 7 (L) 01/23/2018 0550   CREATININE 0.59 01/23/2018 0550   CREATININE 1.00 11/21/2014 0959      Component Value Date/Time    CALCIUM 8.9 01/23/2018 0550   ALKPHOS 105 04/28/2018 1105   AST 17 04/28/2018  1105   ALT 18 04/28/2018 1105   BILITOT 0.4 04/28/2018 1105         Assessment/Plan:   1.Parkinsonism  -likely due to antipsychotics.  First due to geodon and now on abilify.  It is very mild.  I didn't necessarily recommend that she change it but recommend she be aware of the side effects.  She generally feels physically and mentally better.  She will let us and her prescribing nurse know if that changes. 2.  Severe anxiety and depression, with severe hoarding  -she is still dealing with this - "I'm not bringing in extra stuff but I have no place to put even my groceries." 3.  F/u as needed.

## 2018-08-21 NOTE — Assessment & Plan Note (Signed)
Stable.  For now, continue Flovent (fluticasone) 220 g, 2 inhalations via spacer device twice a day, Incruse Ellipta 1 inhalation daily, montelukast 10 mg daily bedtime, and albuterol every 6 hours if needed.  Refill prescriptions have been provided.  Subjective and objective measures of pulmonary function will be followed and the treatment plan will be adjusted accordingly.

## 2018-08-21 NOTE — Assessment & Plan Note (Signed)
   Treatment plan as outlined above for allergic rhinitis.  Continue use of a spacer device with inhaled corticosteroids as well as rinsing and gargling after the use of inhaled corticosteroids.  If this problem persists or progresses despite treatment plan as outlined above, further evaluation/treatment by otolaryngologist, Dr. Benjamine Mola, may be warranted.

## 2018-08-21 NOTE — Progress Notes (Signed)
Follow-up Note  RE: [name] MRN: [id number] DOB: 07/02/1955 Date of Office Visit: 08/21/2018  Primary care provider: Martinique, Betty G, MD Referring provider: Martinique, Betty G, MD  History of present illness: [name] is a 63 y.o. female with persistent asthma, allergic rhinoconjunctivitis, and history of persistent cough presenting today for follow-up.  She is previously evaluated in this clinic via telemedicine visit on May 22, 2018.  She reports that she has been compliant with Flovent 220 micro grams, 2 inhalations via spacer device twice daily, Incruse Ellipta, 1 inhalation daily, and montelukast 10 mg daily.  While on this regimen, she requires albuterol rescue 1 or 2 times per week on average.  Her asthma is typically triggered by heat and pollen exposure.  When she is not in the heat or exposed to pollen she does not experience limitations of normal daily activities or nocturnal awakenings due to lower respiratory symptoms. [name] reports that she has been sneezing more recently despite using azelastine nasal spray and fluticasone nasal spray.  She admits that she has not been using nasal saline. Coughing has not been a problem for her, however she still has difficulty singing, stating that she cannot reach high notes.  She states that she did not see Dr. Benjamine Mola because of rehab after knee surgery.  Assessment and plan: Moderate persistent asthma Stable.  For now, continue Flovent (fluticasone) 220 g, 2 inhalations via spacer device twice a day, Incruse Ellipta 1 inhalation daily, montelukast 10 mg daily bedtime, and albuterol every 6 hours if needed.  Refill prescriptions have been provided.  Subjective and objective measures of pulmonary function will be followed and the treatment plan will be adjusted accordingly.  Perennial allergic rhinitis  Continue appropriate allergen avoidance measures, azelastine nasal spray as needed, fluticasone nasal spray as needed, and  montelukast 10 mg daily.  Nasal saline spray (i.e., Simply Saline) or nasal saline lavage (i.e., NeilMed) is recommended as needed and prior to medicated nasal sprays.  For thick post nasal drainage, add guaifenesin [phone] mg (Mucinex)  twice daily as needed with adequate hydration as discussed.  If allergen avoidance measures and medications fail to adequately relieve symptoms, aeroallergen immunotherapy will be considered.  Dysphonia  Treatment plan as outlined above for allergic rhinitis.  Continue use of a spacer device with inhaled corticosteroids as well as rinsing and gargling after the use of inhaled corticosteroids.  If this problem persists or progresses despite treatment plan as outlined above, further evaluation/treatment by otolaryngologist, Dr. Benjamine Mola, may be warranted.   Meds ordered this encounter  Medications  . Azelastine HCl 0.15 % SOLN    Sig: Place 1-2 sprays into both nostrils 2 (two) times daily.    Dispense:  90 mL    Refill:  3  . albuterol (PROVENTIL) (2.5 MG/3ML) 0.083% nebulizer solution    Sig: TAKE 3 MLS BY NEBULIZATION EVERY 6 HOURS AS NEEDED FOR WHEEZING OR SHORTNESS OF BREATH.    Dispense:  150 mL    Refill:  1  . montelukast (SINGULAIR) 10 MG tablet    Sig: Take 1 tablet (10 mg total) by mouth at bedtime.    Dispense:  90 tablet    Refill:  3  . fluticasone (FLOVENT HFA) 220 MCG/ACT inhaler    Sig: TAKE 2 PUFFS BY MOUTH TWICE A DAY    Dispense:  36 g    Refill:  3  . fluticasone (FLONASE) 50 MCG/ACT nasal spray    Sig: Place 1 spray  into both nostrils 2 (two) times daily as needed for allergies or rhinitis.    Dispense:  16 g    Refill:  5  . umeclidinium bromide (INCRUSE ELLIPTA) 62.5 MCG/INH AEPB    Sig: Inhale 1 puff into the lungs daily.    Dispense:  30 each    Refill:  5  . albuterol (VENTOLIN HFA) 108 (90 Base) MCG/ACT inhaler    Sig: Inhale 1-2 puffs into the lungs daily as needed for wheezing or shortness of breath.    Dispense:   54 g    Refill:  0    Diagnostics: Spirometry:  Normal with an FEV1 of 109% predicted.  Please see scanned spirometry results for details.    Physical examination: Blood pressure 118/72, pulse 70, temperature 98.2 F (36.8 C), temperature source Temporal, resp. rate 16, height 4\' 11"  (1.499 m), weight 140 lb 3.2 oz (63.6 kg), last menstrual period 03/05/2012, SpO2 96 %.  General: Alert, interactive, in no acute distress. HEENT: TMs pearly gray, turbinates mildly edematous without discharge, post-pharynx mildly erythematous. Neck: Supple without lymphadenopathy. Lungs: Clear to auscultation without wheezing, rhonchi or rales. CV: Normal S1, S2 without murmurs. Skin: Warm and dry, without lesions or rashes.  The following portions of the patient's history were reviewed and updated as appropriate: allergies, current medications, past family history, past medical history, past social history, past surgical history and problem list.  Allergies as of 08/21/2018      Reactions   Emetrol Itching   Indomethacin Other (See Comments)   Muscle spasms Causes muscle spasms in neck   Iodinated Diagnostic Agents Itching   Flushed and Fever, itch all over   Amlodipine Swelling   Peripheral edema   Carbamazepine Other (See Comments)   Easy and unusual bleeding   Pseudoephedrine Other (See Comments)   I fly off the walls  CANNOT SLEEP   Risperidone And Related Other (See Comments)   Stomach upset, insomnia, drooling, tremors, "jerks," sensitivity to touch.    Bupivacaine Hives   Pentazocine Other (See Comments)   Unknown reaction MADE ME "LACTATE"   Propoxyphene Itching, Nausea And Vomiting   darvocet   Sulfa Antibiotics Other (See Comments)   Unknown reaction   Tramadol Other (See Comments)   Patient can not remember   Aspirin Other (See Comments)   upset stomach, ringing in the ears   Codeine Itching, Other (See Comments)   Anything related to codeine   Etodolac Rash, Other (See  Comments)   Propranolol Nausea Only, Other (See Comments)      Medication List       Accurate as of August 21, 2018 12:14 PM. If you have any questions, ask your nurse or doctor.        STOP taking these medications   Carbinoxamine Maleate 4 MG Tabs Stopped by: Edmonia Lynch, MD     TAKE these medications   albuterol (2.5 MG/3ML) 0.083% nebulizer solution Commonly known as: PROVENTIL TAKE 3 MLS BY NEBULIZATION EVERY 6 HOURS AS NEEDED FOR WHEEZING OR SHORTNESS OF BREATH. What changed: See the new instructions.   albuterol 108 (90 Base) MCG/ACT inhaler Commonly known as: VENTOLIN HFA Inhale 1-2 puffs into the lungs daily as needed for wheezing or shortness of breath. What changed: Another medication with the same name was changed. Make sure you understand how and when to take each.   alendronate 70 MG tablet Commonly known as: FOSAMAX TAKE 1 TABLET BY MOUTH EVERY 7 DAYS WITH FULL GLASS  OF WATER ON EMPTY STOMACH   ARIPiprazole 5 MG tablet Commonly known as: ABILIFY Take 5 mg by mouth daily.   Azelastine HCl 0.15 % Soln Place 1-2 sprays into both nostrils 2 (two) times daily.   clonazePAM 0.5 MG tablet Commonly known as: KLONOPIN TAKE 1 TABLET DAILY AS NEEDED FOR PANIC   [employer] D3 125 MCG (5000 UT) capsule Generic drug: Cholecalciferol TAKE 1 CAPSULE (5,000 UNITS TOTAL) BY MOUTH DAILY.   Elmiron 100 MG capsule Generic drug: pentosan polysulfate Take 100 mg by mouth 2 (two) times daily.   Flovent HFA 220 MCG/ACT inhaler Generic drug: fluticasone TAKE 2 PUFFS BY MOUTH TWICE A DAY   fluticasone 50 MCG/ACT nasal spray Commonly known as: FLONASE Place 1 spray into both nostrils 2 (two) times daily as needed for allergies or rhinitis.   hydrocortisone cream 1 % Apply 1 application topically 2 (two) times daily as needed for itching.   Incruse Ellipta 62.5 MCG/INH Aepb Generic drug: umeclidinium bromide Inhale 1 puff into the lungs daily.   levothyroxine 50 MCG  tablet Commonly known as: SYNTHROID TAKE 1 TABLET EVERY DAY--- TAKES IN AM   metoprolol tartrate 25 MG tablet Commonly known as: LOPRESSOR Take 1 tablet (25 mg total) by mouth 2 (two) times daily.   montelukast 10 MG tablet Commonly known as: SINGULAIR Take 1 tablet (10 mg total) by mouth at bedtime.   rizatriptan 10 MG disintegrating tablet Commonly known as: MAXALT-MLT TAKE 1 TABLET EVERY DAY AS NEEDED FOR MIGRAINE, MAY REPEAT IN 2 HRS IF NEEDED. MAX 2 DOSES/WK   rosuvastatin 20 MG tablet Commonly known as: CRESTOR TAKE 1 TABLET BY MOUTH EVERY DAY   sertraline 100 MG tablet Commonly known as: ZOLOFT Take 100 mg by mouth at bedtime.   zolpidem 5 MG tablet Commonly known as: AMBIEN TAKE 1 TABLET BY MOUTH EVERYDAY AT BEDTIME       Allergies  Allergen Reactions  . Emetrol Itching  . Indomethacin Other (See Comments)    Muscle spasms Causes muscle spasms in neck  . Iodinated Diagnostic Agents Itching    Flushed and Fever, itch all over  . Amlodipine Swelling    Peripheral edema  . Carbamazepine Other (See Comments)    Easy and unusual bleeding   . Pseudoephedrine Other (See Comments)    I fly off the walls  CANNOT SLEEP  . Risperidone And Related Other (See Comments)    Stomach upset, insomnia, drooling, tremors, "jerks," sensitivity to touch.   . Bupivacaine Hives  . Pentazocine Other (See Comments)    Unknown reaction MADE ME "LACTATE"  . Propoxyphene Itching and Nausea And Vomiting    darvocet  . Sulfa Antibiotics Other (See Comments)    Unknown reaction  . Tramadol Other (See Comments)    Patient can not remember  . Aspirin Other (See Comments)    upset stomach, ringing in the ears  . Codeine Itching and Other (See Comments)    Anything related to codeine  . Etodolac Rash and Other (See Comments)  . Propranolol Nausea Only and Other (See Comments)    Review of systems: Review of systems negative except as noted in HPI / PMHx or noted below:  Constitutional: Negative.  HENT: Negative.   Eyes: Negative.  Respiratory: Negative.   Cardiovascular: Negative.  Gastrointestinal: Negative.  Genitourinary: Negative.  Musculoskeletal: Negative.  Neurological: Negative.  Endo/Heme/Allergies: Negative.  Cutaneous: Negative.   Past Medical History:  Diagnosis Date  . Allergic rhinitis   . Anxiety   .  Benign essential tremor    hands  . Bipolar 1 disorder, mixed, moderate ([place])   . Claustrophobia   . Depression   . Eczema   . Frequency of urination   . History of frequent urinary tract infections   . History of gastroesophageal reflux (GERD)    05-25-2017  per pt no issues since hiatal hernia repair 01/ 2018  . History of hiatal hernia   . History of melanoma excision    early 2000s-- BACK  . History of panic attacks   . 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 Hypothyroidism   . Intermittent palpitations    cardiology--  dr Martinique  . Interstitial cystitis   . Limited jaw range of motion    s/p  bilateral TMJ surgery,  age 80s  . Migraines   . Moderate persistent asthma    pulmologist-- dr Halford Chessman  . OA (osteoarthritis)    both knees  . OSA on CPAP    per last sleep study 09/ 2017  mild OSA, AHI13/hr  . PONV (postoperative nausea and vomiting)   . PVC (premature ventricular contraction)   . Rosacea   . Sensation of pressure in bladder area    per pt intermittant  . TMJ arthralgia   . Urticaria   . Wears glasses   . White coat syndrome without hypertension    05-25-2017  per pt hx hypertension yrs ago, no issues after quitting stressful job    Family History  Problem Relation Age of Onset  . Hypertension Mother        deceased from MVA complications  . Thyroid disease Mother   . Allergic rhinitis Mother   . Testicular cancer Father   . Allergic rhinitis Father   . Colon polyps Sister   . Coronary artery disease Other     Social History   Socioeconomic History  . Marital status:  Divorced    Spouse name: Not on file  . Number of children: 0  . Years of education: Not on file  . Highest education level: Not on file  Occupational History  . Not on file  Social Needs  . Financial resource strain: Not on file  . Food insecurity    Worry: Not on file    Inability: Not on file  . Transportation needs    Medical: Not on file    Non-medical: Not on file  Tobacco Use  . Smoking status: Never Smoker  . Smokeless tobacco: Never Used  Substance and Sexual Activity  . Alcohol use: Yes    Alcohol/week: 0.0 standard drinks    Comment: once every 3-4 months  . Drug use: No  . Sexual activity: Not on file  Lifestyle  . Physical activity    Days per week: Not on file    Minutes per session: Not on file  . Stress: Not on file  Relationships  . Social Herbalist on phone: Not on file    Gets together: Not on file    Attends religious service: Not on file    Active member of club or organization: Not on file    Attends meetings of clubs or organizations: Not on file    Relationship status: Not on file  . Intimate partner violence    Fear of current or ex partner: Not on file    Emotionally abused: Not on file    Physically abused: Not on file  Forced sexual activity: Not on file  Other Topics Concern  . Not on file  Social History Narrative  . Not on file    I appreciate the opportunity to take part in Mariane's care. Please do not hesitate to contact me with questions.  Sincerely,   R. Edgar Frisk, MD

## 2018-08-21 NOTE — Patient Instructions (Addendum)
Moderate persistent asthma Stable.  For now, continue Flovent (fluticasone) 220 g, 2 inhalations via spacer device twice a day, Incruse Ellipta 1 inhalation daily, montelukast 10 mg daily bedtime, and albuterol every 6 hours if needed.  Refill prescriptions have been provided.  Subjective and objective measures of pulmonary function will be followed and the treatment plan will be adjusted accordingly.  Perennial allergic rhinitis  Continue appropriate allergen avoidance measures, azelastine nasal spray as needed, fluticasone nasal spray as needed, and montelukast 10 mg daily.  Nasal saline spray (i.e., Simply Saline) or nasal saline lavage (i.e., NeilMed) is recommended as needed and prior to medicated nasal sprays.  For thick post nasal drainage, add guaifenesin [phone] mg (Mucinex)  twice daily as needed with adequate hydration as discussed.  If allergen avoidance measures and medications fail to adequately relieve symptoms, aeroallergen immunotherapy will be considered.  Dysphonia  Treatment plan as outlined above for allergic rhinitis.  Continue use of a spacer device with inhaled corticosteroids as well as rinsing and gargling after the use of inhaled corticosteroids.  If this problem persists or progresses despite treatment plan as outlined above, further evaluation/treatment by otolaryngologist, Dr. Benjamine Mola, may be warranted.   Return in about 5 months (around 01/21/2019), or if symptoms worsen or fail to improve.

## 2018-08-23 ENCOUNTER — Telehealth: Payer: Self-pay | Admitting: *Deleted

## 2018-08-23 ENCOUNTER — Ambulatory Visit: Payer: Self-pay | Admitting: Neurology

## 2018-08-23 NOTE — Telephone Encounter (Signed)
Message sent to Dr. Martinique for review and approval.  Copied from [place] [phone]. Topic: General - Other >> Aug 23, 2018  8:34 AM Parke Poisson wrote: Reason for CRM: Pt would like her mammogram changed to 3 D. She says if it's medically necessary she found out her insurance will cover it.She has FBD and dense breast tissue

## 2018-08-23 NOTE — Telephone Encounter (Signed)
An order for 3 D mammogram is not needed.Some places do screening 3 D mammogram others ask pt for preference. She can request screening 3D mammogram when she goes for her appt.  Thanks, BJ

## 2018-08-24 ENCOUNTER — Ambulatory Visit (INDEPENDENT_AMBULATORY_CARE_PROVIDER_SITE_OTHER): Payer: Medicare HMO | Admitting: Neurology

## 2018-08-24 ENCOUNTER — Other Ambulatory Visit: Payer: Self-pay | Admitting: Family Medicine

## 2018-08-24 ENCOUNTER — Other Ambulatory Visit: Payer: Self-pay

## 2018-08-24 ENCOUNTER — Encounter: Payer: Self-pay | Admitting: Neurology

## 2018-08-24 VITALS — BP 124/70 | HR 87 | Temp 99.3°F | Ht 60.0 in | Wt 140.4 lb

## 2018-08-24 DIAGNOSIS — G2111 Neuroleptic induced parkinsonism: Secondary | ICD-10-CM | POA: Diagnosis not present

## 2018-08-24 DIAGNOSIS — R69 Illness, unspecified: Secondary | ICD-10-CM | POA: Diagnosis not present

## 2018-08-24 DIAGNOSIS — F423 Hoarding disorder: Secondary | ICD-10-CM

## 2018-08-24 DIAGNOSIS — I10 Essential (primary) hypertension: Secondary | ICD-10-CM

## 2018-08-24 DIAGNOSIS — I493 Ventricular premature depolarization: Secondary | ICD-10-CM

## 2018-08-24 NOTE — Telephone Encounter (Signed)
Spoke with patient and she stated that she spoke with insurance and they will need an order for 3 D so that they will cover it and will need to say that she is high risk. Will need referral placed to the [facility]. Please advise.

## 2018-08-25 NOTE — Telephone Encounter (Signed)
Referral can be placed. Thanks, BJ

## 2018-08-28 ENCOUNTER — Other Ambulatory Visit: Payer: Self-pay | Admitting: *Deleted

## 2018-08-28 DIAGNOSIS — R922 Inconclusive mammogram: Secondary | ICD-10-CM

## 2018-08-28 NOTE — Telephone Encounter (Signed)
Order placed as requested per Dr. Martinique.

## 2018-08-30 DIAGNOSIS — R69 Illness, unspecified: Secondary | ICD-10-CM | POA: Diagnosis not present

## 2018-09-02 DIAGNOSIS — J4520 Mild intermittent asthma, uncomplicated: Secondary | ICD-10-CM | POA: Diagnosis not present

## 2018-09-04 ENCOUNTER — Telehealth: Payer: Self-pay | Admitting: *Deleted

## 2018-09-04 NOTE — Telephone Encounter (Addendum)
Patient called to clarify what medications do the samples need to replace. She received Nasacort and Symbicort samples. Writer reviewed last office visit note by Dr Verlin Fester and those medications are not in note advised patient not to use samples until writer spoke to Dr Verlin Fester to clarify. Writer did go clarify with Dr Verlin Fester to make sure I was reading correctly. Per DR Verlin Fester patient is well controlled on medications and not sure why patient received samples but not to use them.   Writer reached out to patient and advised not to use samples. I did place Incruse samples up front for her to pick up patient is aware

## 2018-09-05 ENCOUNTER — Other Ambulatory Visit: Payer: Self-pay | Admitting: *Deleted

## 2018-09-05 MED ORDER — CARBINOXAMINE MALEATE 4 MG PO TABS: 1.0000 | ORAL_TABLET | Freq: Three times a day (TID) | ORAL | 5 refills | Status: DC | PRN

## 2018-09-12 ENCOUNTER — Other Ambulatory Visit: Payer: Self-pay

## 2018-09-12 ENCOUNTER — Ambulatory Visit
Admission: RE | Admit: 2018-09-12 | Discharge: 2018-09-12 | Disposition: A | Discharge status: Home / Self Care | Payer: Medicare HMO | Source: Ambulatory Visit | Attending: Internal Medicine | Admitting: Internal Medicine

## 2018-09-12 DIAGNOSIS — Z1231 Encounter for screening mammogram for malignant neoplasm of breast: Secondary | ICD-10-CM

## 2018-09-16 ENCOUNTER — Other Ambulatory Visit: Payer: Self-pay | Admitting: Family Medicine

## 2018-09-16 DIAGNOSIS — I493 Ventricular premature depolarization: Secondary | ICD-10-CM

## 2018-09-16 DIAGNOSIS — I10 Essential (primary) hypertension: Secondary | ICD-10-CM

## 2018-09-18 DIAGNOSIS — R69 Illness, unspecified: Secondary | ICD-10-CM | POA: Diagnosis not present

## 2018-09-20 ENCOUNTER — Encounter: Payer: Self-pay | Admitting: Family Medicine

## 2018-09-20 DIAGNOSIS — Z79899 Other long term (current) drug therapy: Secondary | ICD-10-CM | POA: Diagnosis not present

## 2018-09-21 DIAGNOSIS — N3010 Interstitial cystitis (chronic) without hematuria: Secondary | ICD-10-CM | POA: Diagnosis not present

## 2018-09-21 DIAGNOSIS — G4733 Obstructive sleep apnea (adult) (pediatric): Secondary | ICD-10-CM | POA: Diagnosis not present

## 2018-09-21 DIAGNOSIS — R351 Nocturia: Secondary | ICD-10-CM | POA: Diagnosis not present

## 2018-09-21 DIAGNOSIS — N3941 Urge incontinence: Secondary | ICD-10-CM | POA: Diagnosis not present

## 2018-09-23 ENCOUNTER — Other Ambulatory Visit: Payer: Self-pay | Admitting: Family Medicine

## 2018-09-23 DIAGNOSIS — M810 Age-related osteoporosis without current pathological fracture: Secondary | ICD-10-CM

## 2018-09-25 ENCOUNTER — Telehealth: Payer: Self-pay | Admitting: Allergy and Immunology

## 2018-09-25 NOTE — Telephone Encounter (Signed)
Patient states she was seen 2 weeks ago. After her visit, she woke up one morning and her ear was stopped up. Took carbinoxamine and it seemed to make it better. Ear is still a little stopped up, but not as bad. She would like to know if she needs to be doing anything else.

## 2018-09-25 NOTE — Telephone Encounter (Signed)
If her ear stopped up it might be beneficial for her to use the fluticasone nasal spray twice daily for the next several days.  She should spray up and out towards the eye on the same side.  Also, she should use nasal saline and blow her nose and let it dry prior to using the fluticasone nasal spray to clear any mucus residue barrier from the nose. Thanks.

## 2018-09-25 NOTE — Telephone Encounter (Signed)
Dr. Verlin Fester do you have any other recommendations for this patient besides what is already in the AVS.

## 2018-09-26 ENCOUNTER — Other Ambulatory Visit: Payer: Self-pay

## 2018-09-26 DIAGNOSIS — R69 Illness, unspecified: Secondary | ICD-10-CM | POA: Diagnosis not present

## 2018-09-26 MED ORDER — PREDNISONE 10 MG PO TABS: ORAL_TABLET | ORAL | 0 refills | Status: DC

## 2018-09-26 NOTE — Telephone Encounter (Signed)
Pt called back, information given to her.  Pt states that she is doing all of the things that are recommended and they are not helping.  Wants to know if you have any other suggestions.

## 2018-09-26 NOTE — Telephone Encounter (Signed)
Pt informed prednisone was sent to the pharmacy

## 2018-09-26 NOTE — Telephone Encounter (Signed)
Sent in rx.

## 2018-09-26 NOTE — Telephone Encounter (Signed)
Please provide prednisone, 20 mg x 4 days, 10 mg x1 day, then stop. If she still feels ear pressure/congestion despite the prednisone and other measures mentioned previously, she should probably see an otolaryngologist (ear nose and throat physician). Thanks.

## 2018-09-26 NOTE — Telephone Encounter (Signed)
LMOM FOR PATIENT TO CALL BACK

## 2018-09-27 ENCOUNTER — Ambulatory Visit: Payer: Self-pay | Admitting: Family Medicine

## 2018-10-03 DIAGNOSIS — R69 Illness, unspecified: Secondary | ICD-10-CM | POA: Diagnosis not present

## 2018-10-05 ENCOUNTER — Telehealth: Payer: Self-pay | Admitting: Neurology

## 2018-10-05 NOTE — Telephone Encounter (Signed)
Pt wants to speak to someone about a medication that she is on and can it show signs of parkinsons. She spells the medication as follow up Aripiprazole  Please call

## 2018-10-05 NOTE — Telephone Encounter (Signed)
Called spoke with patient she states that the Abilify is causing her to shake more, and hand written is getting cramp again.  We discuss last office note form 08/28/18 she will contact NP that wrote Rx to have her change medication.  Assessment/Plan:   1.Parkinsonism             -likely due to antipsychotics.  First due to geodon and now on abilify.  It is very mild.  I didn't necessarily recommend that she change it but recommend she be aware of the side effects.  She generally feels physically and mentally better.  She will let us and her prescribing nurse know if that changes.   Please be aware

## 2018-10-10 ENCOUNTER — Other Ambulatory Visit: Payer: Self-pay | Admitting: Family Medicine

## 2018-10-10 DIAGNOSIS — I10 Essential (primary) hypertension: Secondary | ICD-10-CM

## 2018-10-10 DIAGNOSIS — F410 Panic disorder [episodic paroxysmal anxiety] without agoraphobia: Secondary | ICD-10-CM | POA: Diagnosis not present

## 2018-10-10 DIAGNOSIS — R69 Illness, unspecified: Secondary | ICD-10-CM | POA: Diagnosis not present

## 2018-10-10 DIAGNOSIS — I493 Ventricular premature depolarization: Secondary | ICD-10-CM

## 2018-10-10 DIAGNOSIS — F411 Generalized anxiety disorder: Secondary | ICD-10-CM | POA: Diagnosis not present

## 2018-10-11 ENCOUNTER — Other Ambulatory Visit: Payer: Self-pay | Admitting: Family Medicine

## 2018-10-11 DIAGNOSIS — E559 Vitamin D deficiency, unspecified: Secondary | ICD-10-CM

## 2018-10-17 DIAGNOSIS — R69 Illness, unspecified: Secondary | ICD-10-CM | POA: Diagnosis not present

## 2018-10-19 ENCOUNTER — Ambulatory Visit (INDEPENDENT_AMBULATORY_CARE_PROVIDER_SITE_OTHER): Payer: Medicare HMO | Admitting: Otolaryngology

## 2018-10-19 DIAGNOSIS — R490 Dysphonia: Secondary | ICD-10-CM

## 2018-10-19 DIAGNOSIS — K219 Gastro-esophageal reflux disease without esophagitis: Secondary | ICD-10-CM

## 2018-10-19 DIAGNOSIS — J382 Nodules of vocal cords: Secondary | ICD-10-CM | POA: Diagnosis not present

## 2018-10-19 DIAGNOSIS — H6122 Impacted cerumen, left ear: Secondary | ICD-10-CM | POA: Diagnosis not present

## 2018-10-22 DIAGNOSIS — G4733 Obstructive sleep apnea (adult) (pediatric): Secondary | ICD-10-CM | POA: Diagnosis not present

## 2018-10-24 DIAGNOSIS — N3010 Interstitial cystitis (chronic) without hematuria: Secondary | ICD-10-CM | POA: Diagnosis not present

## 2018-10-24 DIAGNOSIS — R351 Nocturia: Secondary | ICD-10-CM | POA: Diagnosis not present

## 2018-10-24 DIAGNOSIS — N3941 Urge incontinence: Secondary | ICD-10-CM | POA: Diagnosis not present

## 2018-10-25 DIAGNOSIS — G4733 Obstructive sleep apnea (adult) (pediatric): Secondary | ICD-10-CM | POA: Diagnosis not present

## 2018-10-31 ENCOUNTER — Encounter: Payer: Self-pay | Admitting: Family Medicine

## 2018-10-31 ENCOUNTER — Other Ambulatory Visit: Payer: Self-pay

## 2018-10-31 ENCOUNTER — Ambulatory Visit (INDEPENDENT_AMBULATORY_CARE_PROVIDER_SITE_OTHER): Payer: Medicare HMO | Admitting: Family Medicine

## 2018-10-31 VITALS — BP 120/66 | HR 72 | Temp 97.5°F | Resp 12 | Ht 60.0 in | Wt 138.1 lb

## 2018-10-31 DIAGNOSIS — Z23 Encounter for immunization: Secondary | ICD-10-CM | POA: Diagnosis not present

## 2018-10-31 DIAGNOSIS — R7303 Prediabetes: Secondary | ICD-10-CM | POA: Diagnosis not present

## 2018-10-31 DIAGNOSIS — Z0000 Encounter for general adult medical examination without abnormal findings: Secondary | ICD-10-CM | POA: Diagnosis not present

## 2018-10-31 DIAGNOSIS — I493 Ventricular premature depolarization: Secondary | ICD-10-CM | POA: Diagnosis not present

## 2018-10-31 DIAGNOSIS — E559 Vitamin D deficiency, unspecified: Secondary | ICD-10-CM | POA: Diagnosis not present

## 2018-10-31 DIAGNOSIS — R7401 Elevation of levels of liver transaminase levels: Secondary | ICD-10-CM

## 2018-10-31 DIAGNOSIS — I10 Essential (primary) hypertension: Secondary | ICD-10-CM

## 2018-10-31 DIAGNOSIS — R682 Dry mouth, unspecified: Secondary | ICD-10-CM

## 2018-10-31 DIAGNOSIS — R740 Nonspecific elevation of levels of transaminase and lactic acid dehydrogenase [LDH]: Secondary | ICD-10-CM | POA: Diagnosis not present

## 2018-10-31 LAB — HEMOGLOBIN A1C: Hgb A1c MFr Bld: 6.1 % (ref 4.6–6.5)

## 2018-10-31 LAB — VITAMIN D 25 HYDROXY (VIT D DEFICIENCY, FRACTURES): VITD: 93.42 ng/mL (ref 30.00–100.00)

## 2018-10-31 MED ORDER — METOPROLOL TARTRATE 25 MG PO TABS: 25.0000 mg | ORAL_TABLET | Freq: Two times a day (BID) | ORAL | 1 refills | Status: DC

## 2018-10-31 NOTE — Patient Instructions (Addendum)
  [name] , Thank you for taking time to come for your Medicare Wellness Visit. I appreciate your ongoing commitment to your health goals. Please review the following plan we discussed and let me know if I can assist you in the future.   These are the goals we discussed: Goals   None     This is a list of the screening recommended for you and due dates:  Health Maintenance  Topic Date Due  . Flu Shot  09/09/2018  . Mammogram  09/11/2020  . Pap Smear  10/24/2020  . Tetanus Vaccine  06/26/2023  . Colon Cancer Screening  04/02/2024  .  Hepatitis C: One time screening is recommended by Center for Disease Control  (CDC) for  adults born from [birth year] through 1964.   Completed  . HIV Screening  Completed   A few things to remember from today's visit:   Essential hypertension  Prediabetes - Plan: Hemoglobin A1c  Vitamin D deficiency, unspecified - Plan: VITAMIN D 25 Hydroxy (Vit-D Deficiency, Fractures)  Medicare annual wellness visit, subsequent  Elevated ALT measurement  Dry mouth   A few tips:  -As we age balance is not as good as it was, so there is a higher risks for falls. Please remove small rugs and furniture that is "in your way" and could increase the risk of falls. Stretching exercises may help with fall prevention: Yoga and Tai Chi are some examples. Low impact exercise is better, so you are not very achy the next day.  -Sun screen and avoidance of direct sun light recommended. Caution with dehydration, if working outdoors be sure to drink enough fluids.  - Some medications are not safe as we age, increases the risk of side effects and can potentially interact with other medication you are also taken;  including some of over the counter medications. Be sure to let me know when you start a new medication even if it is a dietary/vitamin supplement.   -Healthy diet low in red meet/animal fat and sugar + regular physical activity is recommended.      Screening  schedule for the next 5-10 years:  Colonoscopy  Glaucoma screening/eye exam every 1-2 years.  Mammogram for breast cancer screening annually.  Flu vaccine annually.  Diabetes screening   Fall prevention   Advance directives:  Please see a lawyer and/or go to this website to help you with advanced directives and designating a health care power of attorney so that your wishes will be followed should you become too ill to make your own medical decisions.  [email or web address]

## 2018-10-31 NOTE — Progress Notes (Signed)
HPI:   [name] is a 63 y.o. female, who is here today for chronic disease management. She also would like to have her AWV today.  Her psychiatrist started her on new medication, blood work was ordered, she brought results to be reviewed today. Hyperlipidemia, currently she is on Crestor 20 mg daily. She is tolerating medication well. On 09/21/2018 TC 185, HDL 68, TG 112, and LDL 96.  Nonfasting glucose 125.  02/27/2018 A1c was mildly elevated at 6.3. Denies abdominal pain, nausea,vomiting, polydipsia,polyuria, or polyphagia.  Hypertension and SVT: Currently she is on metoprolol 25 mg twice daily. She is checking blood pressure occasionally, 120s/80s. She has not been checking HR. She denies unusual headache, visual changes, chest pain, dyspnea, or diaphoresis. She has had palpitations 2-3 times since her last visit.  Creatinine mildly elevated at 1.03, eGFR 59%, and ALT elevated at 35. CBC and rest of CMP in normal range.  Today she is complaining about dry mouth, she thinks it may be related to Myrbetriq, which was recently started for urine incontinence. She denies fever, chills, sore throat, or oral lesions. She denies using OTC medications.  Vitamin D deficiency: Currently she is on vitamin D 5000 units daily.  She lives alone. Independent ADL's and IADL's. No falls in the past year and denies depression symptoms.  Functional Status Survey: Is the patient deaf or have difficulty hearing?: No Does the patient have difficulty seeing, even when wearing glasses/contacts?: No Does the patient have difficulty concentrating, remembering, or making decisions?: No Does the patient have difficulty walking or climbing stairs?: No Does the patient have difficulty dressing or bathing?: No Does the patient have difficulty doing errands alone such as visiting a doctor's office or shopping?: No  Fall Risk  10/31/2018 02/23/2018 11/08/2017 10/24/2017 07/08/2017   Falls in the past year? 0 0 Yes Yes Yes  Comment - - - - -  Number falls in past yr: 0 0 _0 Injury with Fall? 0 0 No No No  Risk Factor Category  - - - - -  Risk for fall due to : Impaired balance/gait;Orthopedic patient - - - -  Follow up Education provided Falls evaluation completed Falls evaluation completed - Falls evaluation completed  Comment - - - - -    Providers she sees regularly: Dr Maryann Conners (s/p TKR) Dr Olevia Perches, GI Dr Bobbitt,immunologiast: Asthma and allergic rhinitis. Dr Sood,pulmonologist. Dr Wrenn,urologist  Depression screen [facility] 2 2/9 10/31/2018  Decreased Interest 1  Down, Depressed, Hopeless 1  PHQ - 2 Score 2  Altered sleeping -  Tired, decreased energy -  Change in appetite -  Feeling bad or failure about yourself  -  Trouble concentrating -  Moving slowly or fidgety/restless -  Suicidal thoughts -  PHQ-9 Score -  Difficult doing work/chores (No Data)  Some recent data might be hidden    Mini-Cog - 10/31/18 1839    Normal clock drawing test?  yes    How many words correct?  3         Hearing Screening   125Hz 250Hz 500Hz 1000Hz 2000Hz 3000Hz 4000Hz 6000Hz 8000Hz  Right ear:           Left ear:             Visual Acuity Screening   Right eye Left eye Both eyes  Without correction:     With correction: 20/40 20/30 20/30    Review of Systems  Constitutional: Positive  for fatigue. Negative for activity change, appetite change and unexpected weight change.  HENT: Negative for nosebleeds and trouble swallowing.   Respiratory: Negative for cough and wheezing.   Cardiovascular: Negative for leg swelling.  Gastrointestinal: Negative for abdominal pain, nausea and vomiting.       Negative for changes in bowel habits.  Genitourinary: Negative for decreased urine volume, dysuria and hematuria.  Skin: Negative for rash and wound.  Allergic/Immunologic: Positive for environmental allergies.  Neurological: Positive for tremors. Negative for  syncope and facial asymmetry.  Psychiatric/Behavioral: Negative for hallucinations. The patient is nervous/anxious.   Rest see pertinent positives and negatives per HPI.   Current Outpatient Medications on File Prior to Visit  Medication Sig Dispense Refill  . albuterol (PROVENTIL) (2.5 MG/3ML) 0.083% nebulizer solution TAKE 3 MLS BY NEBULIZATION EVERY 6 HOURS AS NEEDED FOR WHEEZING OR SHORTNESS OF BREATH. 150 mL 1  . albuterol (VENTOLIN HFA) 108 (90 Base) MCG/ACT inhaler Inhale 1-2 puffs into the lungs daily as needed for wheezing or shortness of breath. 54 g 0  . alendronate (FOSAMAX) 70 MG tablet TAKE 1 TABLET BY MOUTH EVERY 7 DAYS WITH FULL GLASS OF WATER ON EMPTY STOMACH 12 tablet 2  . ARIPiprazole (ABILIFY) 10 MG tablet Take 10 mg by mouth every morning.    . Azelastine HCl 0.15 % SOLN Place 1-2 sprays into both nostrils 2 (two) times daily. 90 mL 3  . benztropine (COGENTIN) 1 MG tablet Take 1 mg by mouth 2 (two) times daily.    . Carbinoxamine Maleate 4 MG TABS Take 1 tablet (4 mg total) by mouth every 8 (eight) hours as needed. 60 tablet 5  . clonazePAM (KLONOPIN) 0.5 MG tablet TAKE 1 TABLET DAILY AS NEEDED FOR PANIC    . [employer] D3 125 MCG (5000 UT) capsule TAKE 1 CAPSULE (5,000 UNITS TOTAL) BY MOUTH DAILY. 90 capsule 1  . ELMIRON 100 MG capsule Take 100 mg by mouth 2 (two) times daily.   1  . famotidine (PEPCID) 20 MG tablet Take 20 mg by mouth at bedtime.    . fluticasone (FLONASE) 50 MCG/ACT nasal spray Place 1 spray into both nostrils 2 (two) times daily as needed for allergies or rhinitis. 16 g 5  . fluticasone (FLOVENT HFA) 220 MCG/ACT inhaler TAKE 2 PUFFS BY MOUTH TWICE A DAY 36 g 3  . hydrocortisone cream 1 % Apply 1 application topically 2 (two) times daily as needed for itching.     Marland Kitchen ketoconazole (NIZORAL) 2 % cream Apply topically daily.    Marland Kitchen levothyroxine (SYNTHROID) 50 MCG tablet TAKE 1 TABLET EVERY DAY--- TAKES IN AM 90 tablet 1  . montelukast (SINGULAIR) 10 MG tablet Take  1 tablet (10 mg total) by mouth at bedtime. 90 tablet 3  . MYRBETRIQ 25 MG TB24 tablet Take 25 mg by mouth daily.    . predniSONE (DELTASONE) 10 MG tablet Take 2 tablets for 4 days then 1 tablet on day 5 then stop 9 tablet 0  . rizatriptan (MAXALT-MLT) 10 MG disintegrating tablet TAKE 1 TABLET EVERY DAY AS NEEDED FOR MIGRAINE, MAY REPEAT IN 2 HRS IF NEEDED. MAX 2 DOSES/WK 9 tablet 3  . rosuvastatin (CRESTOR) 20 MG tablet TAKE 1 TABLET BY MOUTH EVERY DAY 90 tablet 1  . sertraline (ZOLOFT) 100 MG tablet Take 100 mg by mouth at bedtime.     Marland Kitchen umeclidinium bromide (INCRUSE ELLIPTA) 62.5 MCG/INH AEPB Inhale 1 puff into the lungs daily. 30 each 5  . zolpidem (  AMBIEN) 5 MG tablet TAKE 1 TABLET BY MOUTH EVERYDAY AT BEDTIME     No current facility-administered medications on file prior to visit.      Past Medical History:  Diagnosis Date  . Allergic rhinitis   . Anxiety   . Benign essential tremor    hands  . Bipolar 1 disorder, mixed, moderate ([place])   . Claustrophobia   . Depression   . Eczema   . Frequency of urination   . History of frequent urinary tract infections   . History of gastroesophageal reflux (GERD)    05-25-2017  per pt no issues since hiatal hernia repair 01/ 2018  . History of hiatal hernia   . History of melanoma excision    early 2000s-- BACK  . History of panic attacks   . 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 Hypothyroidism   . Intermittent palpitations    cardiology--  dr Martinique  . Interstitial cystitis   . Limited jaw range of motion    s/p  bilateral TMJ surgery,  age 45s  . Migraines   . Moderate persistent asthma    pulmologist-- dr Halford Chessman  . OA (osteoarthritis)    both knees  . OSA on CPAP    per last sleep study 09/ 2017  mild OSA, AHI13/hr  . PONV (postoperative nausea and vomiting)   . PVC (premature ventricular contraction)   . Rosacea   . Sensation of pressure in bladder area    per pt intermittant  . TMJ arthralgia    . Urticaria   . Wears glasses   . White coat syndrome without hypertension    05-25-2017  per pt hx hypertension yrs ago, no issues after quitting stressful job   Allergies  Allergen Reactions  . Emetrol Itching  . Indomethacin Other (See Comments)    Muscle spasms Causes muscle spasms in neck  . Iodinated Diagnostic Agents Itching    Flushed and Fever, itch all over  . Amlodipine Swelling    Peripheral edema  . Carbamazepine Other (See Comments)    Easy and unusual bleeding   . Pseudoephedrine Other (See Comments)    I fly off the walls  CANNOT SLEEP  . Risperidone And Related Other (See Comments)    Stomach upset, insomnia, drooling, tremors, "jerks," sensitivity to touch.   . Bupivacaine Hives  . Pentazocine Other (See Comments)    Unknown reaction MADE ME "LACTATE"  . Propoxyphene Itching and Nausea And Vomiting    darvocet  . Sulfa Antibiotics Other (See Comments)    Unknown reaction  . Tramadol Other (See Comments)    Patient can not remember  . Aspirin Other (See Comments)    upset stomach, ringing in the ears  . Codeine Itching and Other (See Comments)    Anything related to codeine  . Etodolac Rash and Other (See Comments)  . Propranolol Nausea Only and Other (See Comments)    Social History   Socioeconomic History  . Marital status: Divorced    Spouse name: Not on file  . Number of children: 0  . Years of education: Not on file  . Highest education level: Bachelor's degree (e.g., BA, AB, BS)  Occupational History  . Not on file  Social Needs  . Financial resource strain: Not on file  . Food insecurity    Worry: Not on file    Inability: Not on file  . Transportation needs    Medical: Not  on file    Non-medical: Not on file  Tobacco Use  . Smoking status: Never Smoker  . Smokeless tobacco: Never Used  Substance and Sexual Activity  . Alcohol use: Yes    Alcohol/week: 0.0 standard drinks    Comment: once every 3-4 months  . Drug use: No  .  Sexual activity: Not on file  Lifestyle  . Physical activity    Days per week: Not on file    Minutes per session: Not on file  . Stress: Not on file  Relationships  . Social Herbalist on phone: Not on file    Gets together: Not on file    Attends religious service: Not on file    Active member of club or organization: Not on file    Attends meetings of clubs or organizations: Not on file    Relationship status: Not on file  Other Topics Concern  . Not on file  Social History Narrative  . Not on file    Vitals:   10/31/18 1004  BP: 120/66  Pulse: 72  Resp: 12  Temp: (!) 97.5 F (36.4 C)  SpO2: 96%   Body mass index is 26.98 kg/m.   Physical Exam  Nursing note and vitals reviewed. Constitutional: She is oriented to person, place, and time. She appears well-developed. No distress.  HENT:  Head: Normocephalic and atraumatic.  Mouth/Throat: Oropharynx is clear and moist and mucous membranes are normal.  Eyes: Pupils are equal, round, and reactive to light. Conjunctivae are normal.  Cardiovascular: Normal rate and regular rhythm.  No murmur heard. DP pulses present bilateral.  Respiratory: Effort normal and breath sounds normal. No respiratory distress.  GI: Soft. She exhibits no mass. There is no hepatomegaly. There is no abdominal tenderness.  Musculoskeletal:        General: No edema.  Lymphadenopathy:    She has no cervical adenopathy.  Neurological: She is alert and oriented to person, place, and time. She has normal strength. She displays tremor. No cranial nerve deficit.  Mildly unstable gait with no assistance.  Skin: Skin is warm. No rash noted. No erythema.  Psychiatric: Her mood appears anxious.  Well groomed, good eye contact.    ASSESSMENT AND PLAN:  [name] was seen today for follow-up.  Diagnoses and all orders for this visit: Orders Placed This Encounter  Procedures  . Flu Vaccine QUAD 36+ mos IM  . Hemoglobin A1c  . VITAMIN D  25 Hydroxy (Vit-D Deficiency, Fractures)    Lab Results  Component Value Date   HGBA1C 6.1 10/31/2018    Medicare annual wellness visit, subsequent We discussed the importance of staying active, physically and mentally, as well as the benefits of a healthy/balnace diet. Low impact exercise that involve stretching and strengthing are ideal. Vaccines up to date. We discussed preventive screening for the next 5-10 years, summery of recommendations given in AVS:  Colonoscopy 03/2014. Mammogram 09/12/18. Pap smear: 10/24/2017 Immunization History  Administered Date(s) Administered  . Influenza Split 02/09/2012  . Influenza Whole 12/08/2006, 12/01/2007  . Influenza,inj,Quad PF,6+ Mos 12/11/2013, 11/21/2014, 10/13/2016, 10/24/2017, 10/31/2018  . Influenza-Unspecified 12/01/2015  . Pneumococcal Polysaccharide-23 12/12/2013  . Tdap 04/16/2011, 06/25/2013  . Zoster Recombinat (Shingrix) 05/30/2018, 08/16/2018    Fall prevention.  Advance directives and end of life discussed, She does not have POA or living will,recommended,web site information on AVS.   Essential hypertension BP adequately controlled. Continue metoprolol 25 mg twice daily. Continue monitoring BP regularly. Low-salt  diet recommended. Follow-up in 6 months.  Prediabetes Healthy lifestyle for primary prevention. Further recommendation will be given according to A1c results.  -     Hemoglobin A1c  Vitamin D deficiency, unspecified Continue vitamin D 5000 units daily. Dose will be adjusted according to 25 OH vitamin D results if needed.  -     VITAMIN D 25 Hydroxy (Vit-D Deficiency, Fractures)  Elevated ALT measurement Mild. We will plan on repeating LFT's.  Dry mouth We discussed possible etiologies. Explained that it is not uncommon for some psychiatry medications to cause dry mouth. Myrbeqtry can also be contributing to problem but it is helping with urinary symptoms.  Balance pros and cons of taking meds  and make a decision about stopping or continuing meds. Discussed side effects with prescribers.  Need for influenza vaccination -     Flu Vaccine QUAD 36+ mos IM  Return in 6 months (on 04/30/2019) for cpe.   -[name] was advised to return sooner than planned today if new concerns arise.     G. Martinique, MD  [facility]. [place] office.

## 2018-11-13 DIAGNOSIS — F411 Generalized anxiety disorder: Secondary | ICD-10-CM | POA: Diagnosis not present

## 2018-11-13 DIAGNOSIS — F410 Panic disorder [episodic paroxysmal anxiety] without agoraphobia: Secondary | ICD-10-CM | POA: Diagnosis not present

## 2018-11-13 DIAGNOSIS — R69 Illness, unspecified: Secondary | ICD-10-CM | POA: Diagnosis not present

## 2018-11-20 DIAGNOSIS — R69 Illness, unspecified: Secondary | ICD-10-CM | POA: Diagnosis not present

## 2018-11-21 DIAGNOSIS — R3915 Urgency of urination: Secondary | ICD-10-CM | POA: Diagnosis not present

## 2018-11-21 DIAGNOSIS — N3010 Interstitial cystitis (chronic) without hematuria: Secondary | ICD-10-CM | POA: Diagnosis not present

## 2018-11-21 DIAGNOSIS — R351 Nocturia: Secondary | ICD-10-CM | POA: Diagnosis not present

## 2018-11-21 DIAGNOSIS — G4733 Obstructive sleep apnea (adult) (pediatric): Secondary | ICD-10-CM | POA: Diagnosis not present

## 2018-11-24 DIAGNOSIS — G4733 Obstructive sleep apnea (adult) (pediatric): Secondary | ICD-10-CM | POA: Diagnosis not present

## 2018-11-28 DIAGNOSIS — R69 Illness, unspecified: Secondary | ICD-10-CM | POA: Diagnosis not present

## 2018-11-29 ENCOUNTER — Telehealth: Payer: Self-pay | Admitting: Family Medicine

## 2018-11-29 NOTE — Telephone Encounter (Signed)
Patient is requesting call back from CMA to discuss latest lab results. She states she does not remember receiving them.

## 2018-12-01 ENCOUNTER — Other Ambulatory Visit: Payer: Self-pay | Admitting: Family Medicine

## 2018-12-01 DIAGNOSIS — E039 Hypothyroidism, unspecified: Secondary | ICD-10-CM

## 2018-12-04 DIAGNOSIS — J383 Other diseases of vocal cords: Secondary | ICD-10-CM | POA: Diagnosis not present

## 2018-12-04 DIAGNOSIS — R490 Dysphonia: Secondary | ICD-10-CM | POA: Diagnosis not present

## 2018-12-05 DIAGNOSIS — R69 Illness, unspecified: Secondary | ICD-10-CM | POA: Diagnosis not present

## 2018-12-06 ENCOUNTER — Telehealth: Payer: Self-pay | Admitting: Family Medicine

## 2018-12-06 DIAGNOSIS — L814 Other melanin hyperpigmentation: Secondary | ICD-10-CM | POA: Diagnosis not present

## 2018-12-06 DIAGNOSIS — D225 Melanocytic nevi of trunk: Secondary | ICD-10-CM | POA: Diagnosis not present

## 2018-12-06 DIAGNOSIS — L219 Seborrheic dermatitis, unspecified: Secondary | ICD-10-CM | POA: Diagnosis not present

## 2018-12-06 DIAGNOSIS — L570 Actinic keratosis: Secondary | ICD-10-CM | POA: Diagnosis not present

## 2018-12-06 DIAGNOSIS — L719 Rosacea, unspecified: Secondary | ICD-10-CM | POA: Diagnosis not present

## 2018-12-06 DIAGNOSIS — Z86018 Personal history of other benign neoplasm: Secondary | ICD-10-CM | POA: Diagnosis not present

## 2018-12-06 DIAGNOSIS — L821 Other seborrheic keratosis: Secondary | ICD-10-CM | POA: Diagnosis not present

## 2018-12-06 DIAGNOSIS — L720 Epidermal cyst: Secondary | ICD-10-CM | POA: Diagnosis not present

## 2018-12-06 DIAGNOSIS — Q809 Congenital ichthyosis, unspecified: Secondary | ICD-10-CM | POA: Diagnosis not present

## 2018-12-06 DIAGNOSIS — Z85828 Personal history of other malignant neoplasm of skin: Secondary | ICD-10-CM | POA: Diagnosis not present

## 2018-12-06 NOTE — Telephone Encounter (Signed)
alendronate (FOSAMAX) 70 MG tablet    Patient requesting call back from CMA to discuss this medication. Patient states she was under the impression she was to stop taking it.

## 2018-12-08 NOTE — Telephone Encounter (Signed)
Pt is calling back concerning the below message

## 2018-12-11 ENCOUNTER — Telehealth: Payer: Self-pay | Admitting: *Deleted

## 2018-12-11 NOTE — Telephone Encounter (Signed)
Please advise. Should patient continue or stop this medication?

## 2018-12-11 NOTE — Telephone Encounter (Signed)
I did not recommend stopping Fosamax. If she had any problem with medication? If she is having side effects, she can discontinue and we can discuss other options. We talked about Myrbetriq as possibly causing dry mouth.We also discussed cholesterol medication and Metoprolol. Thanks, BJ

## 2018-12-11 NOTE — Telephone Encounter (Signed)
Patient is calling with request to go over her last test results- she forgot what her readings were. Patient notified of Vit D and Hgb A1c levels. Patient satisfied with information- no questions noted.

## 2018-12-11 NOTE — Telephone Encounter (Signed)
Notes recorded by Martinique, Betty G, MD on 11/02/2018 at 3:27 PM EDT  Vit D in normal range.  HgA1C still mildly elevated but improved.   Thanks  Left message for patient to call back. CRM created.  Please relay results from above.

## 2018-12-12 NOTE — Telephone Encounter (Signed)
Patient is aware. Nothing further needed.  

## 2018-12-13 DIAGNOSIS — R69 Illness, unspecified: Secondary | ICD-10-CM | POA: Diagnosis not present

## 2018-12-21 ENCOUNTER — Telehealth: Payer: Self-pay | Admitting: Family Medicine

## 2018-12-21 NOTE — Telephone Encounter (Signed)
Pt stated she has been having nausea for no reason. She also has a sore on her tongue that has not fully healed. The nausea is not new to her but has been more frequent. She wants to know if Dr. Martinique will send a rx to her pharmacy. Please advise.

## 2018-12-22 ENCOUNTER — Telehealth (INDEPENDENT_AMBULATORY_CARE_PROVIDER_SITE_OTHER): Payer: Medicare HMO | Admitting: Family Medicine

## 2018-12-22 ENCOUNTER — Encounter: Payer: Self-pay | Admitting: Family Medicine

## 2018-12-22 ENCOUNTER — Other Ambulatory Visit: Payer: Self-pay

## 2018-12-22 DIAGNOSIS — R110 Nausea: Secondary | ICD-10-CM | POA: Diagnosis not present

## 2018-12-22 DIAGNOSIS — K219 Gastro-esophageal reflux disease without esophagitis: Secondary | ICD-10-CM | POA: Diagnosis not present

## 2018-12-22 DIAGNOSIS — G4733 Obstructive sleep apnea (adult) (pediatric): Secondary | ICD-10-CM | POA: Diagnosis not present

## 2018-12-22 MED ORDER — ONDANSETRON HCL 4 MG PO TABS: 4.0000 mg | ORAL_TABLET | Freq: Two times a day (BID) | ORAL | 0 refills | Status: DC | PRN

## 2018-12-22 NOTE — Telephone Encounter (Signed)
Can we squeeze in a virtual visit for 3:30 this afternoon for her ?

## 2018-12-22 NOTE — Progress Notes (Signed)
Virtual Visit via Telephone Note  I connected with [name] on 12/22/18 at  3:30 PM EST by telephone and verified that I am speaking with the correct person using two identifiers.   I discussed the limitations, risks, security and privacy concerns of performing an evaluation and management service by telephone and the availability of in person appointments. I also discussed with the patient that there may be a patient responsible charge related to this service. The patient expressed understanding and agreed to proceed.  Location patient: home Location provider: work office Participants present for the call: patient, provider Patient did not have a visit in the prior 7 days to address this/these issue(s).   History of Present Illness: [name] is a 63 yo female with history of bipolar disorder, anxiety, GERD, and migraines was complaining about a week of persistent nausea. She states that she has had nausea, "periodically for a while", usually problem resolved within a couple days. She has not identified exacerbating or alleviating factors. In the past she has been on Phenergan. This time she has tried OTC Pepto-Bismol and ginger ale, have not helped. She was looking for OTC Emetrol,which has helped in the past, could not find it.  She denies chills, fever, chest pain, dyspnea, abdominal pain, vomiting, changes in bowel habits, or unusual urinary symptoms (history of interstitial cystitis). Negative for sick contacts or recent travel. S/P cholecystectomy.  Problem has been stable. Recently evaluated by ENT because voice changes and according to patient, she was told she has some vocal cord inflammation related with GERD.  She was placed on Pepcid. She has not noted heartburn or acid reflux.  Observations/Objective: Patient sounds cheerful and well on the phone. I do not appreciate any SOB,cough,stridor,or wheezing. Speech and thought processing are grossly  intact. Patient reported vitals:N/A  Assessment and Plan:  1. Nausea without vomiting Problem is chronic. She agrees with trying Zofran instead of Phenergan. Continue small and frequent sips of clear fluids. Bland food intake. Clearly instructed about warning signs.  EDG in 03/2014 showed 3 cm hiatal hernia, she is status post Nissen application.  Also found antral gastritis.  - ondansetron (ZOFRAN) 4 MG tablet; Take 1 tablet (4 mg total) by mouth 2 (two) times daily as needed for nausea or vomiting.  Dispense: 15 tablet; Refill: 0  2. Gastroesophageal reflux disease, unspecified whether esophagitis present This problem could be contributing to nausea. Continue Pepcid 20 mg daily at bedtime. GERD precautions to continue.  Follow Up Instructions:  Return if symptoms worsen or fail to improve.  I did not refer this patient for an OV in the next 24 hours for this/these issue(s).  I discussed the assessment and treatment plan with the patient. The patient was provided an opportunity to ask questions and all were answered. The patient agreed with the plan and demonstrated an understanding of the instructions.   The patient was advised to call back or seek an in-person evaluation if the symptoms worsen or if the condition fails to improve as anticipated.  I provided 9 minutes of non-face-to-face time during this encounter.   Betty Martinique, MD

## 2018-12-22 NOTE — Telephone Encounter (Signed)
It is fine. Thanks, BJ

## 2018-12-22 NOTE — Telephone Encounter (Signed)
Patient has been scheduled

## 2018-12-25 DIAGNOSIS — G4733 Obstructive sleep apnea (adult) (pediatric): Secondary | ICD-10-CM | POA: Diagnosis not present

## 2018-12-26 DIAGNOSIS — R69 Illness, unspecified: Secondary | ICD-10-CM | POA: Diagnosis not present

## 2018-12-26 DIAGNOSIS — F411 Generalized anxiety disorder: Secondary | ICD-10-CM | POA: Diagnosis not present

## 2018-12-26 DIAGNOSIS — F410 Panic disorder [episodic paroxysmal anxiety] without agoraphobia: Secondary | ICD-10-CM | POA: Diagnosis not present

## 2018-12-27 DIAGNOSIS — R69 Illness, unspecified: Secondary | ICD-10-CM | POA: Diagnosis not present

## 2018-12-28 DIAGNOSIS — R490 Dysphonia: Secondary | ICD-10-CM | POA: Diagnosis not present

## 2018-12-28 DIAGNOSIS — J383 Other diseases of vocal cords: Secondary | ICD-10-CM | POA: Diagnosis not present

## 2019-01-10 DIAGNOSIS — R69 Illness, unspecified: Secondary | ICD-10-CM | POA: Diagnosis not present

## 2019-01-12 ENCOUNTER — Other Ambulatory Visit: Payer: Self-pay | Admitting: Family Medicine

## 2019-01-18 ENCOUNTER — Ambulatory Visit (INDEPENDENT_AMBULATORY_CARE_PROVIDER_SITE_OTHER): Payer: Medicare HMO | Admitting: Otolaryngology

## 2019-01-21 DIAGNOSIS — G4733 Obstructive sleep apnea (adult) (pediatric): Secondary | ICD-10-CM | POA: Diagnosis not present

## 2019-01-22 ENCOUNTER — Ambulatory Visit (INDEPENDENT_AMBULATORY_CARE_PROVIDER_SITE_OTHER): Payer: Medicare HMO | Admitting: Allergy and Immunology

## 2019-01-22 ENCOUNTER — Other Ambulatory Visit: Payer: Self-pay

## 2019-01-22 ENCOUNTER — Encounter: Payer: Self-pay | Admitting: Allergy and Immunology

## 2019-01-22 DIAGNOSIS — J4540 Moderate persistent asthma, uncomplicated: Secondary | ICD-10-CM

## 2019-01-22 DIAGNOSIS — J3089 Other allergic rhinitis: Secondary | ICD-10-CM | POA: Diagnosis not present

## 2019-01-22 MED ORDER — CARBINOXAMINE MALEATE 4 MG PO TABS: 1.0000 | ORAL_TABLET | Freq: Three times a day (TID) | ORAL | 1 refills | Status: DC | PRN

## 2019-01-22 NOTE — Patient Instructions (Addendum)
Moderate persistent asthma Stable.  For now, continue Flovent (fluticasone) 220 g, 2 inhalations via spacer device twice a day, Incruse Ellipta 1 inhalation daily, montelukast 10 mg daily bedtime, and albuterol every 6 hours if needed.  If subjective and objective measures of pulmonary function remain stable, we will consider stepping down therapy on the next visit.  Perennial allergic rhinitis  Continue appropriate allergen avoidance measures, azelastine nasal spray as needed, fluticasone nasal spray as needed, and montelukast 10 mg daily.  Nasal saline spray (i.e., Simply Saline) or nasal saline lavage (i.e., NeilMed) is recommended as needed and prior to medicated nasal sprays.  For thick post nasal drainage, add guaifenesin [phone] mg (Mucinex)  twice daily as needed with adequate hydration as discussed.  If allergen avoidance measures and medications fail to adequately relieve symptoms, aeroallergen immunotherapy will be considered.   Return in about 5 months (around 06/22/2019), or if symptoms worsen or fail to improve.

## 2019-01-22 NOTE — Assessment & Plan Note (Signed)
   Continue appropriate allergen avoidance measures, azelastine nasal spray as needed, fluticasone nasal spray as needed, and montelukast 10 mg daily.  Nasal saline spray (i.e., Simply Saline) or nasal saline lavage (i.e., NeilMed) is recommended as needed and prior to medicated nasal sprays.  For thick post nasal drainage, add guaifenesin [phone] mg (Mucinex)  twice daily as needed with adequate hydration as discussed.  If allergen avoidance measures and medications fail to adequately relieve symptoms, aeroallergen immunotherapy will be considered.

## 2019-01-22 NOTE — Assessment & Plan Note (Addendum)
Stable.  For now, continue Flovent (fluticasone) 220 g, 2 inhalations via spacer device twice a day, Incruse Ellipta 1 inhalation daily, montelukast 10 mg daily bedtime, and albuterol every 6 hours if needed.  If subjective and objective measures of pulmonary function remain stable, we will consider stepping down therapy on the next visit.

## 2019-01-22 NOTE — Progress Notes (Signed)
Follow-up Note  RE: [name] MRN: [id number] DOB: 11/28/1955 Date of Office Visit: 01/22/2019  Primary care provider: Martinique, Betty G, MD Referring provider: Martinique, Betty G, MD  History of present illness: [name] is a 63 y.o. female with persistent asthma, allergic rhinoconjunctivitis, and history of persistent cough presenting today for follow-up.  She was last seen in this clinic on August 21, 2018.  She reports that her asthma is well controlled while taking Flovent 110 g, 2 inhalations via spacer device twice daily, and Incruse Ellipta, 1 inhalation daily.  While on this regimen, she rarely requires albuterol rescue and does not experience limitations in normal daily activities or nocturnal awakenings due to lower respiratory symptoms. She reports that her nasal allergy symptoms are well controlled with azelastine nasal spray each morning and carbinoxamine as needed.  She was evaluated by an otolaryngologist because of some changes in her singing voice.  She was referred from the otolaryngologist to a speech pathologist who believe that she had thrush around her vocal folds.  She reports that the thrush was treated by the otolaryngologist.  Assessment and plan: Moderate persistent asthma Stable.  For now, continue Flovent (fluticasone) 220 g, 2 inhalations via spacer device twice a day, Incruse Ellipta 1 inhalation daily, montelukast 10 mg daily bedtime, and albuterol every 6 hours if needed.  If subjective and objective measures of pulmonary function remain stable, we will consider stepping down therapy on the next visit.  Perennial allergic rhinitis  Continue appropriate allergen avoidance measures, azelastine nasal spray as needed, fluticasone nasal spray as needed, and montelukast 10 mg daily.  Nasal saline spray (i.e., Simply Saline) or nasal saline lavage (i.e., NeilMed) is recommended as needed and prior to medicated nasal sprays.  For thick post  nasal drainage, add guaifenesin [phone] mg (Mucinex)  twice daily as needed with adequate hydration as discussed.  If allergen avoidance measures and medications fail to adequately relieve symptoms, aeroallergen immunotherapy will be considered.   Meds ordered this encounter  Medications  . Carbinoxamine Maleate 4 MG TABS    Sig: Take 1 tablet (4 mg total) by mouth every 8 (eight) hours as needed.    Dispense:  180 tablet    Refill:  1    Diagnostics: Due to COVID-19 precautions, spirometry was deferred today.    Physical examination: Blood pressure 116/62, pulse 73, temperature 97.6 F (36.4 C), temperature source Temporal, resp. rate 18, last menstrual period 03/05/2012, SpO2 98 %.  General: Alert, interactive, in no acute distress. HEENT: TMs pearly gray, turbinates mildly edematous without discharge, post-pharynx unremarkable. Neck: Supple without lymphadenopathy. Lungs: Clear to auscultation without wheezing, rhonchi or rales. CV: Normal S1, S2 without murmurs. Skin: Warm and dry, without lesions or rashes.  The following portions of the patient's history were reviewed and updated as appropriate: allergies, current medications, past family history, past medical history, past social history, past surgical history and problem list.  Current Outpatient Medications  Medication Sig Dispense Refill  . acetaminophen (TYLENOL) 500 MG tablet Take 500 mg by mouth every 6 (six) hours as needed.    Marland Kitchen albuterol (PROVENTIL) (2.5 MG/3ML) 0.083% nebulizer solution TAKE 3 MLS BY NEBULIZATION EVERY 6 HOURS AS NEEDED FOR WHEEZING OR SHORTNESS OF BREATH. 150 mL 1  . albuterol (VENTOLIN HFA) 108 (90 Base) MCG/ACT inhaler Inhale 1-2 puffs into the lungs daily as needed for wheezing or shortness of breath. 54 g 0  . alendronate (FOSAMAX) 70 MG tablet TAKE 1 TABLET  BY MOUTH EVERY 7 DAYS WITH FULL GLASS OF WATER ON EMPTY STOMACH 12 tablet 2  . ARIPiprazole (ABILIFY) 10 MG tablet Take 10 mg by mouth  every morning.    . Ascorbic Acid (VITAMIN C) 1000 MG tablet Take 1,000 mg by mouth daily.    . Azelastine HCl 0.15 % SOLN Place 1-2 sprays into both nostrils 2 (two) times daily. 90 mL 3  . Carbinoxamine Maleate 4 MG TABS Take 1 tablet (4 mg total) by mouth every 8 (eight) hours as needed. 180 tablet 1  . clonazePAM (KLONOPIN) 0.5 MG tablet TAKE 1 TABLET DAILY AS NEEDED FOR PANIC    . [employer] D3 125 MCG (5000 UT) capsule TAKE 1 CAPSULE (5,000 UNITS TOTAL) BY MOUTH DAILY. 90 capsule 1  . ELMIRON 100 MG capsule Take 100 mg by mouth 2 (two) times daily.   1  . estradiol (ESTRACE) 0.1 MG/GM vaginal cream Place 1 Applicatorful vaginally once a week.    . famotidine (PEPCID) 20 MG tablet Take 20 mg by mouth at bedtime.    . fluticasone (FLONASE) 50 MCG/ACT nasal spray Place 1 spray into both nostrils 2 (two) times daily as needed for allergies or rhinitis. 16 g 5  . fluticasone (FLOVENT HFA) 220 MCG/ACT inhaler TAKE 2 PUFFS BY MOUTH TWICE A DAY 36 g 3  . hydrocortisone cream 1 % Apply 1 application topically 2 (two) times daily as needed for itching.     Marland Kitchen ketoconazole (NIZORAL) 2 % cream Apply topically daily.    Marland Kitchen levothyroxine (SYNTHROID) 50 MCG tablet TAKE 1 TABLET EVERY DAY IN THE MORNING 90 tablet 1  . metoprolol tartrate (LOPRESSOR) 25 MG tablet Take 1 tablet (25 mg total) by mouth 2 (two) times daily. 180 tablet 1  . metroNIDAZOLE (METROCREAM) 0.75 % cream Apply 1 application topically 2 (two) times daily.    . montelukast (SINGULAIR) 10 MG tablet Take 1 tablet (10 mg total) by mouth at bedtime. 90 tablet 3  . MYRBETRIQ 25 MG TB24 tablet Take 25 mg by mouth daily.    . ondansetron (ZOFRAN) 4 MG tablet Take 1 tablet (4 mg total) by mouth 2 (two) times daily as needed for nausea or vomiting. 15 tablet 0  . rizatriptan (MAXALT-MLT) 10 MG disintegrating tablet TAKE 1 TABLET EVERY DAY AS NEEDED FOR MIGRAINE, MAY REPEAT IN 2 HRS IF NEEDED. MAX 2 DOSES/WK 9 tablet 3  . rosuvastatin (CRESTOR) 20 MG tablet  TAKE 1 TABLET BY MOUTH EVERY DAY 90 tablet 1  . sertraline (ZOLOFT) 100 MG tablet Take 100 mg by mouth at bedtime.     . sodium chloride (OCEAN) 0.65 % SOLN nasal spray Place 1 spray into both nostrils as needed for congestion.    . trihexyphenidyl (ARTANE) 2 MG tablet Take 2 mg by mouth daily.    Marland Kitchen umeclidinium bromide (INCRUSE ELLIPTA) 62.5 MCG/INH AEPB Inhale 1 puff into the lungs daily. 30 each 5  . zinc gluconate 50 MG tablet Take 50 mg by mouth daily.    Marland Kitchen zolpidem (AMBIEN) 5 MG tablet TAKE 1 TABLET BY MOUTH EVERYDAY AT BEDTIME     No current facility-administered medications for this visit.    Allergies  Allergen Reactions  . Emetrol Itching  . Indomethacin Other (See Comments)    Muscle spasms Causes muscle spasms in neck  . Iodinated Diagnostic Agents Itching    Flushed and Fever, itch all over  . Amlodipine Swelling    Peripheral edema  . Carbamazepine Other (  See Comments)    Easy and unusual bleeding   . Pseudoephedrine Other (See Comments)    I fly off the walls  CANNOT SLEEP  . Risperidone And Related Other (See Comments)    Stomach upset, insomnia, drooling, tremors, "jerks," sensitivity to touch.   . Bupivacaine Hives  . Pentazocine Other (See Comments)    Unknown reaction MADE ME "LACTATE"  . Propoxyphene Itching and Nausea And Vomiting    darvocet  . Sulfa Antibiotics Other (See Comments)    Unknown reaction  . Tramadol Other (See Comments)    Patient can not remember  . Aspirin Other (See Comments)    upset stomach, ringing in the ears  . Codeine Itching and Other (See Comments)    Anything related to codeine  . Etodolac Rash and Other (See Comments)  . Propranolol Nausea Only and Other (See Comments)    I appreciate the opportunity to take part in Kianah's care. Please do not hesitate to contact me with questions.  Sincerely,   R. Edgar Frisk, MD

## 2019-01-23 ENCOUNTER — Other Ambulatory Visit: Payer: Self-pay | Admitting: Family Medicine

## 2019-01-24 DIAGNOSIS — R69 Illness, unspecified: Secondary | ICD-10-CM | POA: Diagnosis not present

## 2019-01-27 ENCOUNTER — Other Ambulatory Visit: Payer: Self-pay | Admitting: Allergy and Immunology

## 2019-02-02 ENCOUNTER — Other Ambulatory Visit: Payer: Self-pay | Admitting: Family Medicine

## 2019-02-02 DIAGNOSIS — I493 Ventricular premature depolarization: Secondary | ICD-10-CM

## 2019-02-02 DIAGNOSIS — I10 Essential (primary) hypertension: Secondary | ICD-10-CM

## 2019-02-07 DIAGNOSIS — R69 Illness, unspecified: Secondary | ICD-10-CM | POA: Diagnosis not present

## 2019-02-20 DIAGNOSIS — R69 Illness, unspecified: Secondary | ICD-10-CM | POA: Diagnosis not present

## 2019-02-21 DIAGNOSIS — G4733 Obstructive sleep apnea (adult) (pediatric): Secondary | ICD-10-CM | POA: Diagnosis not present

## 2019-02-21 DIAGNOSIS — R490 Dysphonia: Secondary | ICD-10-CM | POA: Diagnosis not present

## 2019-02-21 DIAGNOSIS — K219 Gastro-esophageal reflux disease without esophagitis: Secondary | ICD-10-CM | POA: Diagnosis not present

## 2019-02-27 DIAGNOSIS — R69 Illness, unspecified: Secondary | ICD-10-CM | POA: Diagnosis not present

## 2019-03-07 DIAGNOSIS — R69 Illness, unspecified: Secondary | ICD-10-CM | POA: Diagnosis not present

## 2019-03-08 DIAGNOSIS — G4733 Obstructive sleep apnea (adult) (pediatric): Secondary | ICD-10-CM | POA: Diagnosis not present

## 2019-03-13 DIAGNOSIS — R69 Illness, unspecified: Secondary | ICD-10-CM | POA: Diagnosis not present

## 2019-03-21 DIAGNOSIS — R69 Illness, unspecified: Secondary | ICD-10-CM | POA: Diagnosis not present

## 2019-03-23 DIAGNOSIS — M19012 Primary osteoarthritis, left shoulder: Secondary | ICD-10-CM | POA: Diagnosis not present

## 2019-03-23 DIAGNOSIS — M25511 Pain in right shoulder: Secondary | ICD-10-CM | POA: Diagnosis not present

## 2019-03-23 DIAGNOSIS — M79644 Pain in right finger(s): Secondary | ICD-10-CM | POA: Diagnosis not present

## 2019-03-23 DIAGNOSIS — M180 Bilateral primary osteoarthritis of first carpometacarpal joints: Secondary | ICD-10-CM | POA: Diagnosis not present

## 2019-03-23 DIAGNOSIS — M25512 Pain in left shoulder: Secondary | ICD-10-CM | POA: Diagnosis not present

## 2019-03-23 DIAGNOSIS — M19011 Primary osteoarthritis, right shoulder: Secondary | ICD-10-CM | POA: Diagnosis not present

## 2019-03-23 DIAGNOSIS — M1812 Unilateral primary osteoarthritis of first carpometacarpal joint, left hand: Secondary | ICD-10-CM | POA: Diagnosis not present

## 2019-03-23 DIAGNOSIS — M1811 Unilateral primary osteoarthritis of first carpometacarpal joint, right hand: Secondary | ICD-10-CM | POA: Diagnosis not present

## 2019-03-24 DIAGNOSIS — G4733 Obstructive sleep apnea (adult) (pediatric): Secondary | ICD-10-CM | POA: Diagnosis not present

## 2019-03-26 DIAGNOSIS — M19019 Primary osteoarthritis, unspecified shoulder: Secondary | ICD-10-CM | POA: Insufficient documentation

## 2019-03-27 DIAGNOSIS — F411 Generalized anxiety disorder: Secondary | ICD-10-CM | POA: Diagnosis not present

## 2019-03-27 DIAGNOSIS — R69 Illness, unspecified: Secondary | ICD-10-CM | POA: Diagnosis not present

## 2019-03-27 DIAGNOSIS — F410 Panic disorder [episodic paroxysmal anxiety] without agoraphobia: Secondary | ICD-10-CM | POA: Diagnosis not present

## 2019-03-28 ENCOUNTER — Other Ambulatory Visit: Payer: Self-pay | Admitting: Allergy and Immunology

## 2019-03-28 DIAGNOSIS — R69 Illness, unspecified: Secondary | ICD-10-CM | POA: Diagnosis not present

## 2019-04-04 DIAGNOSIS — M19011 Primary osteoarthritis, right shoulder: Secondary | ICD-10-CM | POA: Diagnosis not present

## 2019-04-04 DIAGNOSIS — M19012 Primary osteoarthritis, left shoulder: Secondary | ICD-10-CM | POA: Diagnosis not present

## 2019-04-04 DIAGNOSIS — M1811 Unilateral primary osteoarthritis of first carpometacarpal joint, right hand: Secondary | ICD-10-CM | POA: Diagnosis not present

## 2019-04-04 DIAGNOSIS — M180 Bilateral primary osteoarthritis of first carpometacarpal joints: Secondary | ICD-10-CM | POA: Diagnosis not present

## 2019-04-05 DIAGNOSIS — R69 Illness, unspecified: Secondary | ICD-10-CM | POA: Diagnosis not present

## 2019-04-08 DIAGNOSIS — G4733 Obstructive sleep apnea (adult) (pediatric): Secondary | ICD-10-CM | POA: Diagnosis not present

## 2019-04-22 ENCOUNTER — Other Ambulatory Visit: Payer: Self-pay | Admitting: Family Medicine

## 2019-04-23 ENCOUNTER — Telehealth: Payer: Self-pay | Admitting: Family Medicine

## 2019-04-23 NOTE — Telephone Encounter (Signed)
Pt would like to make sure it is ok to get covid-19 vaccine.

## 2019-04-24 NOTE — Telephone Encounter (Signed)
I called and spoke with pt. She is aware it is okay for her to get the vaccine.

## 2019-04-24 NOTE — Telephone Encounter (Signed)
I do not see contraindication for COVID-19 vaccination. At this point given her risk factors, benefit is greater than risk, so I recommend it. Thanks, BJ

## 2019-04-25 DIAGNOSIS — R69 Illness, unspecified: Secondary | ICD-10-CM | POA: Diagnosis not present

## 2019-04-25 DIAGNOSIS — F410 Panic disorder [episodic paroxysmal anxiety] without agoraphobia: Secondary | ICD-10-CM | POA: Diagnosis not present

## 2019-04-25 DIAGNOSIS — F411 Generalized anxiety disorder: Secondary | ICD-10-CM | POA: Diagnosis not present

## 2019-04-26 DIAGNOSIS — R69 Illness, unspecified: Secondary | ICD-10-CM | POA: Diagnosis not present

## 2019-05-02 ENCOUNTER — Other Ambulatory Visit: Payer: Self-pay | Admitting: Family Medicine

## 2019-05-03 ENCOUNTER — Other Ambulatory Visit: Payer: Self-pay | Admitting: Family Medicine

## 2019-05-03 DIAGNOSIS — E039 Hypothyroidism, unspecified: Secondary | ICD-10-CM

## 2019-05-06 DIAGNOSIS — G4733 Obstructive sleep apnea (adult) (pediatric): Secondary | ICD-10-CM | POA: Diagnosis not present

## 2019-05-12 ENCOUNTER — Other Ambulatory Visit: Payer: Self-pay | Admitting: Allergy and Immunology

## 2019-05-14 ENCOUNTER — Other Ambulatory Visit: Payer: Self-pay

## 2019-05-15 ENCOUNTER — Ambulatory Visit: Payer: Medicare HMO | Admitting: Family Medicine

## 2019-05-16 DIAGNOSIS — M180 Bilateral primary osteoarthritis of first carpometacarpal joints: Secondary | ICD-10-CM | POA: Diagnosis not present

## 2019-05-16 DIAGNOSIS — M654 Radial styloid tenosynovitis [de Quervain]: Secondary | ICD-10-CM | POA: Diagnosis not present

## 2019-05-16 DIAGNOSIS — M19011 Primary osteoarthritis, right shoulder: Secondary | ICD-10-CM | POA: Diagnosis not present

## 2019-05-18 ENCOUNTER — Ambulatory Visit (INDEPENDENT_AMBULATORY_CARE_PROVIDER_SITE_OTHER): Payer: Medicare HMO | Admitting: Family Medicine

## 2019-05-18 ENCOUNTER — Encounter: Payer: Self-pay | Admitting: Family Medicine

## 2019-05-18 ENCOUNTER — Other Ambulatory Visit: Payer: Self-pay

## 2019-05-18 VITALS — BP 128/60 | HR 91 | Temp 97.9°F

## 2019-05-18 DIAGNOSIS — K149 Disease of tongue, unspecified: Secondary | ICD-10-CM

## 2019-05-18 DIAGNOSIS — R143 Flatulence: Secondary | ICD-10-CM | POA: Diagnosis not present

## 2019-05-18 DIAGNOSIS — R69 Illness, unspecified: Secondary | ICD-10-CM | POA: Diagnosis not present

## 2019-05-18 MED ORDER — DEXAMETHASONE 0.5 MG/5ML PO ELIX: 1.0000 mg | ORAL_SOLUTION | Freq: Four times a day (QID) | ORAL | 1 refills | Status: DC | PRN

## 2019-05-18 NOTE — Progress Notes (Signed)
Subjective:     Patient ID: [name], female   DOB: Nov 25, 1955, 64 y.o.   MRN: [id number]  HPI   Jamyriah was seen for the following issues:  She states she has had some mild discomfort around the lateral edges of her tongue-bilaterally.  She feels like she has had more of a smooth texture in those locations.  She has not seen any ulcerations.  She has not had any loss of taste.  No recent change of toothpaste or mouthwashes.  No new medications.  No sore throat.  Other issue is that she states she has had some increased flatulence recently.  She tried over-the-counter simethicone without much improvement.  No abdominal pain.  No change in bowel habits.  No nausea or vomiting.  Past Medical History:  Diagnosis Date  . Allergic rhinitis   . Anxiety   . Benign essential tremor    hands  . Bipolar 1 disorder, mixed, moderate ([place])   . Claustrophobia   . Depression   . Eczema   . Frequency of urination   . History of frequent urinary tract infections   . History of gastroesophageal reflux (GERD)    05-25-2017  per pt no issues since hiatal hernia repair 01/ 2018  . History of hiatal hernia   . History of melanoma excision    early 2000s-- BACK  . History of panic attacks   . 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 Hypothyroidism   . Intermittent palpitations    cardiology--  dr Martinique  . Interstitial cystitis   . Limited jaw range of motion    s/p  bilateral TMJ surgery,  age 61s  . Migraines   . Moderate persistent asthma    pulmologist-- dr Halford Chessman  . OA (osteoarthritis)    both knees  . OSA on CPAP    per last sleep study 09/ 2017  mild OSA, AHI13/hr  . PONV (postoperative nausea and vomiting)   . PVC (premature ventricular contraction)   . Rosacea   . Sensation of pressure in bladder area    per pt intermittant  . TMJ arthralgia   . Urticaria   . Wears glasses   . White coat syndrome without hypertension    05-25-2017  per pt hx  hypertension yrs ago, no issues after quitting stressful job   Past Surgical History:  Procedure Laterality Date  . [place] STUDY N/A 09/22/2015   Procedure: [place] STUDY;  Surgeon: Doran Stabler, MD;  Location: [facility];  Service: Gastroenterology;  Laterality: N/A;  . ADENOIDECTOMY    . ANAL FISSURE REPAIR  age 89  (35)  . BREAST EXCISIONAL BIOPSY Right 04-16-2003  dr p. young  [place]   benign  . BUNIONECTOMY Right early 2000s  . CARPAL TUNNEL RELEASE Left age 59 (66)  . CHOLECYSTECTOMY OPEN  age 43  . CYSTO WITH HYDRODISTENSION N/A 06/02/2017   Procedure: CYSTOSCOPY/HYDRODISTENSION OF BLADDER;  Surgeon: Irine Seal, MD;  [facility];  Service: Urology;  Laterality: N/A;  . CYSTO/ HYDRODISTENTION/  INSTILLATION THERAPY  1990s  . DX LAPAROSCOPY/  DX HYSTEROSCOPY/  D & C  08-07-2007   dr dove  [facility]  . ENDOMETRIAL ABLATION W/ NOVASURE  10-21-2008   dr Harolyn Rutherford  [facility]  . ESOPHAGEAL MANOMETRY N/A 09/22/2015   Procedure: ESOPHAGEAL MANOMETRY (EM);  Surgeon: Doran Stabler, MD;  Location: [facility];  Service: Gastroenterology;  Laterality:  N/A;  with impedence   . FINGER ARTHROPLASTY Bilateral    left little finger 2016:  right middle finger 06/ 2018  . KNEE ARTHROSCOPY Bilateral x3 left ;  x3  right-- last one age 4 (81)  . NASAL SEPTUM SURGERY  age 59s  . ROBOT ASSISTED REDUCTION PARAESOPHAGEAL HIATAL HERNIA/ TYPE 2 MEDIASTINAL DISSECTION/ PRIMARY HIATAL HERNIA REPAIR/ ANTERIOR & POSTERIOR GASTROPEXY/ NISSEN FUNDOPLICATION  0000000   DR GROSS  [facility]  . SINOSCOPY    . TEMPOROMANDIBULAR JOINT SURGERY Bilateral x3  -- age 31s   per pt used graft  . TONSILLECTOMY    . TOTAL KNEE ARTHROPLASTY Left 12/26/2017   Procedure: LEFT TOTAL KNEE ARTHROPLASTY;  Surgeon: Gaynelle Arabian, MD;  Location: [facility];  Service: Orthopedics;  Laterality: Left;  46min  . TRANSTHORACIC ECHOCARDIOGRAM  12-06-2017   dr Martinique   ef 55-60%, grade 1 diastoilc dysfunction,  trivial MR  . TRIGGER FINGER RELEASE Bilateral last one 2017   several release's bilaterally  . ULNAR NERVE TRANSPOSITION Bilateral age 3 (1980)    reports that she has never smoked. She has never used smokeless tobacco. She reports current alcohol use. She reports that she does not use drugs. family history includes Allergic rhinitis in her father and mother; Colon polyps in her sister; Coronary artery disease in an other family member; Healthy in her brother and sister; Hypertension in her mother; Testicular cancer in her father; Thyroid disease in her mother. Allergies  Allergen Reactions  . Emetrol Itching  . Indomethacin Other (See Comments)    Muscle spasms Causes muscle spasms in neck  . Iodinated Diagnostic Agents Itching    Flushed and Fever, itch all over  . Amlodipine Swelling    Peripheral edema  . Carbamazepine Other (See Comments)    Easy and unusual bleeding   . Pseudoephedrine Other (See Comments)    I fly off the walls  CANNOT SLEEP  . Risperidone And Related Other (See Comments)    Stomach upset, insomnia, drooling, tremors, "jerks," sensitivity to touch.   . Bupivacaine Hives  . Pentazocine Other (See Comments)    Unknown reaction MADE ME "LACTATE"  . Propoxyphene Itching and Nausea And Vomiting    darvocet  . Sulfa Antibiotics Other (See Comments)    Unknown reaction  . Tramadol Other (See Comments)    Patient can not remember  . Aspirin Other (See Comments)    upset stomach, ringing in the ears  . Codeine Itching and Other (See Comments)    Anything related to codeine  . Etodolac Rash and Other (See Comments)  . Propranolol Nausea Only and Other (See Comments)     Review of Systems  Constitutional: Negative for chills and fever.  HENT: Negative for sore throat and trouble swallowing.   Respiratory: Negative for shortness of breath.   Cardiovascular: Negative for chest pain.       Objective:   Physical Exam Vitals reviewed.  Constitutional:       Appearance: Normal appearance.  HENT:     Mouth/Throat:     Comments: Tongue appears normal with no loss of papillae.  No thrush.  No oral lesions noted.  No posterior pharyngeal lesions noted. Cardiovascular:     Rate and Rhythm: Normal rate and regular rhythm.  Musculoskeletal:     Cervical back: Neck supple.  Lymphadenopathy:     Cervical: No cervical adenopathy.  Neurological:     Mental Status: She is alert.  Assessment:     #1 complaints of intermittent mouth discomfort involving the tongue.  No evidence for thrush.  No lesions noted.  Exam is normal.  She is not describing typical burning mouth syndrome type symptoms  #2 increased flatulence    Plan:     -Recommended trial of dexamethasone elixir 2 teaspoons 4 times daily as needed and touch base if symptoms not improving over the next couple of weeks  -Discussed low FODMAP diet  Eulas Post MD [facility]

## 2019-05-18 NOTE — Patient Instructions (Signed)
Low-FODMAP Eating Plan  FODMAPs (fermentable oligosaccharides, disaccharides, monosaccharides, and polyols) are sugars that are hard for some people to digest. A low-FODMAP eating plan may help some people who have bowel (intestinal) diseases to manage their symptoms. This meal plan can be complicated to follow. Work with a diet and nutrition specialist (dietitian) to make a low-FODMAP eating plan that is right for you. A dietitian can make sure that you get enough nutrition from this diet. What are tips for following this plan? Reading food labels  Check labels for hidden FODMAPs such as: ? High-fructose syrup. ? Honey. ? Agave. ? Natural fruit flavors. ? Onion or garlic powder.  Choose low-FODMAP foods that contain 3-4 grams of fiber per serving.  Check food labels for serving sizes. Eat only one serving at a time to make sure FODMAP levels stay low. Meal planning  Follow a low-FODMAP eating plan for up to 6 weeks, or as told by your health care provider or dietitian.  To follow the eating plan: 1. Eliminate high-FODMAP foods from your diet completely. 2. Gradually reintroduce high-FODMAP foods into your diet one at a time. Most people should wait a few days after introducing one high-FODMAP food before they introduce the next high-FODMAP food. Your dietitian can recommend how quickly you may reintroduce foods. 3. Keep a daily record of what you eat and drink, and make note of any symptoms that you have after eating. 4. Review your daily record with a dietitian regularly. Your dietitian can help you identify which foods you can eat and which foods you should avoid. General tips  Drink enough fluid each day to keep your urine pale yellow.  Avoid processed foods. These often have added sugar and may be high in FODMAPs.  Avoid most dairy products, whole grains, and sweeteners.  Work with a dietitian to make sure you get enough fiber in your diet. Recommended  foods Grains  Gluten-free grains, such as rice, oats, buckwheat, quinoa, corn, polenta, and millet. Gluten-free pasta, bread, or cereal. Rice noodles. Corn tortillas. Vegetables  Eggplant, zucchini, cucumber, peppers, green beans, Brussels sprouts, bean sprouts, lettuce, arugula, kale, Swiss chard, spinach, collard greens, bok choy, summer squash, potato, and tomato. Limited amounts of corn, carrot, and sweet potato. Green parts of scallions. Fruits  Bananas, oranges, lemons, limes, blueberries, raspberries, strawberries, grapes, cantaloupe, honeydew melon, kiwi, papaya, passion fruit, and pineapple. Limited amounts of dried cranberries, banana chips, and shredded coconut. Dairy  Lactose-free milk, yogurt, and kefir. Lactose-free cottage cheese and ice cream. Non-dairy milks, such as almond, coconut, hemp, and rice milk. Yogurts made of non-dairy milks. Limited amounts of goat cheese, brie, mozzarella, parmesan, swiss, and other hard cheeses. Meats and other protein foods  Unseasoned beef, pork, poultry, or fish. Eggs. Bacon. Tofu (firm) and tempeh. Limited amounts of nuts and seeds, such as almonds, walnuts, brazil nuts, pecans, peanuts, pumpkin seeds, chia seeds, and sunflower seeds. Fats and oils  Butter-free spreads. Vegetable oils, such as olive, canola, and sunflower oil. Seasoning and other foods  Artificial sweeteners with names that do not end in "ol" such as aspartame, saccharine, and stevia. Maple syrup, white table sugar, raw sugar, brown sugar, and molasses. Fresh basil, coriander, parsley, rosemary, and thyme. Beverages  Water and mineral water. Sugar-sweetened soft drinks. Small amounts of orange juice or cranberry juice. Black and green tea. Most dry wines. Coffee. This may not be a complete list of low-FODMAP foods. Talk with your dietitian for more information. Foods to avoid Grains  Wheat,   including kamut, durum, and semolina. Barley and bulgur. Couscous. Wheat-based  cereals. Wheat noodles, bread, crackers, and pastries. Vegetables  Chicory root, artichoke, asparagus, cabbage, snow peas, sugar snap peas, mushrooms, and cauliflower. Onions, garlic, leeks, and the white part of scallions. Fruits  Fresh, dried, and juiced forms of apple, pear, watermelon, peach, plum, cherries, apricots, blackberries, boysenberries, figs, nectarines, and mango. Avocado. Dairy  Milk, yogurt, ice cream, and soft cheese. Cream and sour cream. Milk-based sauces. Custard. Meats and other protein foods  Fried or fatty meat. Sausage. Cashews and pistachios. Soybeans, baked beans, black beans, chickpeas, kidney beans, fava beans, navy beans, lentils, and split peas. Seasoning and other foods  Any sugar-free gum or candy. Foods that contain artificial sweeteners such as sorbitol, mannitol, isomalt, or xylitol. Foods that contain honey, high-fructose corn syrup, or agave. Bouillon, vegetable stock, beef stock, and chicken stock. Garlic and onion powder. Condiments made with onion, such as hummus, chutney, pickles, relish, salad dressing, and salsa. Tomato paste. Beverages  Chicory-based drinks. Coffee substitutes. Chamomile tea. Fennel tea. Sweet or fortified wines such as port or sherry. Diet soft drinks made with isomalt, mannitol, maltitol, sorbitol, or xylitol. Apple, pear, and mango juice. Juices with high-fructose corn syrup. This may not be a complete list of high-FODMAP foods. Talk with your dietitian to discuss what dietary choices are best for you.  Summary  A low-FODMAP eating plan is a short-term diet that eliminates FODMAPs from your diet to help ease symptoms of certain bowel diseases.  The eating plan usually lasts up to 6 weeks. After that, high-FODMAP foods are restarted gradually, one at a time, so you can find out which may be causing symptoms.  A low-FODMAP eating plan can be complicated. It is best to work with a dietitian who has experience with this type of  plan. This information is not intended to replace advice given to you by your health care provider. Make sure you discuss any questions you have with your health care provider. Document Revised: 01/07/2017 Document Reviewed: 09/21/2016 Elsevier Patient Education  2020 Elsevier Inc.  

## 2019-05-23 DIAGNOSIS — R351 Nocturia: Secondary | ICD-10-CM | POA: Diagnosis not present

## 2019-05-23 DIAGNOSIS — N3941 Urge incontinence: Secondary | ICD-10-CM | POA: Diagnosis not present

## 2019-05-23 DIAGNOSIS — N3010 Interstitial cystitis (chronic) without hematuria: Secondary | ICD-10-CM | POA: Diagnosis not present

## 2019-05-23 LAB — CBC AND DIFFERENTIAL
HCT: 39 (ref 36–46)
Hemoglobin: 12.1 (ref 12.0–16.0)
Platelets: 279 (ref 150–399)
WBC: 13.4

## 2019-05-23 LAB — HEPATIC FUNCTION PANEL
ALT: 36 — AB (ref 7–35)
AST: 31 (ref 13–35)
Alkaline Phosphatase: 78 (ref 25–125)
Bilirubin, Direct: 0.09
Bilirubin, Total: 0.4

## 2019-05-23 LAB — CBC: RBC: 4.32 (ref 3.87–5.11)

## 2019-05-23 LAB — COMPREHENSIVE METABOLIC PANEL: Albumin: 4.4 (ref 3.5–5.0)

## 2019-05-31 ENCOUNTER — Telehealth: Payer: Self-pay | Admitting: Family Medicine

## 2019-05-31 NOTE — Telephone Encounter (Signed)
Pt stated her [facility] office took a urine sample and they called her around 4/19 with the results. They inform the pt that her white blood cell count is too high and that they will inform her PCP of this information. Pt has not heard anything from her PCP and wondering if she will or what to do?  Pt can be reached at [phone]

## 2019-06-01 NOTE — Telephone Encounter (Signed)
Message sent to PCP for review and approval. °

## 2019-06-01 NOTE — Telephone Encounter (Signed)
I do not have the results of labs done at her urologist's office. We will need to obtain a copy and make recommendations accordingly. Thanks, BJ

## 2019-06-01 NOTE — Telephone Encounter (Signed)
Left message to return call to office.

## 2019-06-01 NOTE — Telephone Encounter (Signed)
Pt called again to check on the message she left yesterday. She is aware that Dr. Martinique was not in office yesterday but with it being 2:30 on Friday she expected a call back. I explained that Dr. Martinique is with patients and will get to her message as soon as she has a moment. She is requesting a call back today.

## 2019-06-04 NOTE — Telephone Encounter (Signed)
Left message for patient to return call to office. 

## 2019-06-05 NOTE — Telephone Encounter (Signed)
Spoke with patient and advised her of Dr. Doug Sou recommendations. Patient verbalized understanding and stated that she would contact [facility] and have them send over results to this office.

## 2019-06-05 NOTE — Telephone Encounter (Signed)
Pt called in advising she has been waiting for a call back. Advised of previous messages she informed she did not get a call or vm. Confirmed phone number with pt, what we have on file is correct.

## 2019-06-06 ENCOUNTER — Other Ambulatory Visit: Payer: Self-pay | Admitting: Family Medicine

## 2019-06-06 DIAGNOSIS — G4733 Obstructive sleep apnea (adult) (pediatric): Secondary | ICD-10-CM | POA: Diagnosis not present

## 2019-06-06 DIAGNOSIS — M810 Age-related osteoporosis without current pathological fracture: Secondary | ICD-10-CM

## 2019-06-06 NOTE — Telephone Encounter (Signed)
Pt called to see if Dr. Martinique would be treating her per the results from urology. Advised we have not received results from urology as of yet.

## 2019-06-07 DIAGNOSIS — H5213 Myopia, bilateral: Secondary | ICD-10-CM | POA: Diagnosis not present

## 2019-06-07 DIAGNOSIS — H2513 Age-related nuclear cataract, bilateral: Secondary | ICD-10-CM | POA: Diagnosis not present

## 2019-06-07 DIAGNOSIS — H43813 Vitreous degeneration, bilateral: Secondary | ICD-10-CM | POA: Diagnosis not present

## 2019-06-13 NOTE — Telephone Encounter (Signed)
Spoke to patient and informed her that office hasn't received results from Urology office. Patient stated that when she talked to them, they had the wrong office as her PCP and they were supposed to change it and send them here. Patient stated that she would call that office and have them send results over.

## 2019-06-15 ENCOUNTER — Encounter: Payer: Self-pay | Admitting: Family Medicine

## 2019-06-15 NOTE — Telephone Encounter (Signed)
Note read to patient. Patient verbalized understanding. CPE scheduled for June.

## 2019-06-15 NOTE — Telephone Encounter (Signed)
Received CBC results of labs done at her urologist's office on 05/23/19. WBC's mildly elevated at 13.4 (4.4-11.3). In my opinion this is not "too high." It has been elevated before, max 16.4 in 2019. We can certainly repeat lab during next visit. Thanks, BJ

## 2019-06-15 NOTE — Telephone Encounter (Signed)
Received results from [facility], abstracted results in chart. Copy of results given to PCP for review and recommendations.

## 2019-06-21 DIAGNOSIS — R490 Dysphonia: Secondary | ICD-10-CM | POA: Diagnosis not present

## 2019-06-21 DIAGNOSIS — K219 Gastro-esophageal reflux disease without esophagitis: Secondary | ICD-10-CM | POA: Diagnosis not present

## 2019-06-25 ENCOUNTER — Ambulatory Visit: Payer: Medicare HMO | Admitting: Allergy and Immunology

## 2019-06-25 ENCOUNTER — Encounter: Payer: Self-pay | Admitting: Allergy and Immunology

## 2019-06-25 ENCOUNTER — Other Ambulatory Visit: Payer: Self-pay

## 2019-06-25 VITALS — BP 120/78 | HR 72 | Temp 98.1°F | Resp 18 | Ht 59.0 in | Wt 176.0 lb

## 2019-06-25 DIAGNOSIS — J4540 Moderate persistent asthma, uncomplicated: Secondary | ICD-10-CM | POA: Diagnosis not present

## 2019-06-25 DIAGNOSIS — J3089 Other allergic rhinitis: Secondary | ICD-10-CM

## 2019-06-25 DIAGNOSIS — J0100 Acute maxillary sinusitis, unspecified: Secondary | ICD-10-CM

## 2019-06-25 DIAGNOSIS — H1013 Acute atopic conjunctivitis, bilateral: Secondary | ICD-10-CM

## 2019-06-25 MED ORDER — PREDNISONE 1 MG PO TABS: 10.0000 mg | ORAL_TABLET | Freq: Every day | ORAL | Status: DC

## 2019-06-25 MED ORDER — AMOXICILLIN-POT CLAVULANATE 875-125 MG PO TABS: 1.0000 | ORAL_TABLET | Freq: Two times a day (BID) | ORAL | 0 refills | Status: AC

## 2019-06-25 MED ORDER — OLOPATADINE HCL 0.2 % OP SOLN
1.0000 [drp] | Freq: Every day | OPHTHALMIC | 5 refills | Status: DC | PRN
Start: 2019-06-25 — End: 2020-09-03

## 2019-06-25 NOTE — Assessment & Plan Note (Signed)
   Treatment plan as outlined above for allergic rhinitis.  A prescription has been provided for generic Pataday, one drop per eye daily as needed.  If insurance does not cover this medication, it may be purchased over-the-counter.  I have also recommended eye lubricant drops (i.e., Natural Tears) as needed.

## 2019-06-25 NOTE — Assessment & Plan Note (Addendum)
Stable.  Cost of medication because of insurance coverage has been an issue.  Samples have been provided for Alvesco and we will prescribe Flovent or Alvesco, whichever is less expensive.  For now, continue Flovent 220 g (or Alvesco 160 g), 2 inhalations via spacer device twice a day, Incruse Ellipta 1 inhalation daily, montelukast 10 mg daily bedtime, and albuterol every 6 hours if needed.  If subjective and objective measures of pulmonary function remain stable, we will consider stepping down therapy on the next visit.

## 2019-06-25 NOTE — Patient Instructions (Addendum)
Acute maxillary/frontal sinusitis  Prednisone has been provided, 40 mg x3 days, 20 mg x1 day, 10 mg x1 day, then stop.  A prescription has been provided for Augmentin 875-125 mg, 1 p.o. twice daily x10 days.  Fluticasone nasal spray, 2 sprays per nostril daily for now.  Azelastine nasal spray, 1-2 sprays per nostril twice daily for now.  Nasal saline lavage (NeilMed) has been recommended as needed and prior to medicated nasal sprays along with instructions for proper administration.  For thick post nasal drainage, add guaifenesin 1200 mg (Mucinex Maximum Strength)  twice daily as needed with adequate hydration as discussed.  Perennial allergic rhinitis  Continue appropriate allergen avoidance measures.  Fluticasone nasal spray, azelastine nasal spray, and nasal saline lavage (as above).  Continue carbinoxamine as needed.  Continue montelukast 10 mg daily at bedtime.  Allergic conjunctivitis  Treatment plan as outlined above for allergic rhinitis.  A prescription has been provided for generic Pataday, one drop per eye daily as needed.  If insurance does not cover this medication, it may be purchased over-the-counter.  I have also recommended eye lubricant drops (i.e., Natural Tears) as needed.  Moderate persistent asthma Stable.  Cost of medication because of insurance coverage has been an issue.  Samples have been provided for Alvesco and we will prescribe Flovent or Alvesco, whichever is less expensive.  For now, continue Flovent 220 g (or Alvesco 160 g), 2 inhalations via spacer device twice a day, Incruse Ellipta 1 inhalation daily, montelukast 10 mg daily bedtime, and albuterol every 6 hours if needed.  If subjective and objective measures of pulmonary function remain stable, we will consider stepping down therapy on the next visit.   Return in about 5 months (around 11/25/2019).

## 2019-06-25 NOTE — Progress Notes (Signed)
Follow-up Note  RE: [name] MRN: [id number] DOB: 26-Dec-1955 Date of Office Visit: 06/25/2019  Primary care provider: Martinique, Betty G, MD Referring provider: Martinique, Betty G, MD  History of present illness: [name] is a 64 y.o. female with persistent asthma and allergic rhinitis presenting today for sick visit.  She was last seen in this clinic in December 2020.  She complains that over the past 5 days she has experienced progressively increasing sinus pressure over the cheekbones and forehead, as well as thick postnasal drainage.  She denies fevers or chills, though she has experienced some discolored yellow/green mucus production.  She has been attempted to alleviate the sinus symptoms with carbinoxamine and acetaminophen.  Prior to the onset of sinus pressure/pain, she had been experiencing sneezing, nasal congestion, and ocular pruritus.  She reports that in the interval since her previous visit her asthma has been well controlled.  She rarely requires albuterol rescue and denies limitations in normal daily activities and nocturnal awakenings due to lower respiratory symptoms. She is currently taking Flovent 220 mcg, 2 inhalations via spacer device twice daily, Incruse 1 inhalation daily, and montelukast 10 mg daily.   Assessment and plan: Acute maxillary/frontal sinusitis  Prednisone has been provided, 40 mg x3 days, 20 mg x1 day, 10 mg x1 day, then stop.  A prescription has been provided for Augmentin 875-125 mg, 1 p.o. twice daily x10 days.  Fluticasone nasal spray, 2 sprays per nostril daily for now.  Azelastine nasal spray, 1-2 sprays per nostril twice daily for now.  Nasal saline lavage (NeilMed) has been recommended as needed and prior to medicated nasal sprays along with instructions for proper administration.  For thick post nasal drainage, add guaifenesin 1200 mg (Mucinex Maximum Strength)  twice daily as needed with adequate hydration as  discussed.  Perennial allergic rhinitis  Continue appropriate allergen avoidance measures.  Fluticasone nasal spray, azelastine nasal spray, and nasal saline lavage (as above).  Continue carbinoxamine as needed.  Continue montelukast 10 mg daily at bedtime.  Allergic conjunctivitis  Treatment plan as outlined above for allergic rhinitis.  A prescription has been provided for generic Pataday, one drop per eye daily as needed.  If insurance does not cover this medication, it may be purchased over-the-counter.  I have also recommended eye lubricant drops (i.e., Natural Tears) as needed.  Moderate persistent asthma Stable.  Cost of medication because of insurance coverage has been an issue.  Samples have been provided for Alvesco and we will prescribe Flovent or Alvesco, whichever is less expensive.  For now, continue Flovent 220 g (or Alvesco 160 g), 2 inhalations via spacer device twice a day, Incruse Ellipta 1 inhalation daily, montelukast 10 mg daily bedtime, and albuterol every 6 hours if needed.  If subjective and objective measures of pulmonary function remain stable, we will consider stepping down therapy on the next visit.   Meds ordered this encounter  Medications  . amoxicillin-clavulanate (AUGMENTIN) 875-125 MG tablet    Sig: Take 1 tablet by mouth 2 (two) times daily for 10 days.    Dispense:  20 tablet    Refill:  0  . Olopatadine HCl (PATADAY) 0.2 % SOLN    Sig: Place 1 drop into both eyes daily as needed.    Dispense:  2.5 mL    Refill:  5  . predniSONE (DELTASONE) tablet 10 mg    Diagnostics: Spirometry:  Normal with an FEV1 of 109% predicted. This study was performed while the  patient was asymptomatic.  Please see scanned spirometry results for details.    Physical examination: Blood pressure 120/78, pulse 72, temperature 98.1 F (36.7 C), temperature source Temporal, resp. rate 18, height 4\' 11"  (1.499 m), weight 176 lb (79.8 kg), last menstrual period  03/05/2012, SpO2 98 %.  General: Alert, interactive, in no acute distress. HEENT: TMs pearly gray, turbinates moderately edematous with thick discharge, post-pharynx erythematous. Neck: Supple without lymphadenopathy. Lungs: Clear to auscultation without wheezing, rhonchi or rales. CV: Normal S1, S2 without murmurs. Skin: Warm and dry, without lesions or rashes.  The following portions of the patient's history were reviewed and updated as appropriate: allergies, current medications, past family history, past medical history, past social history, past surgical history and problem list.  Current Outpatient Medications  Medication Sig Dispense Refill  . acetaminophen (TYLENOL) 500 MG tablet Take 500 mg by mouth every 6 (six) hours as needed.    Marland Kitchen albuterol (PROVENTIL) (2.5 MG/3ML) 0.083% nebulizer solution TAKE 3 MLS BY NEBULIZATION EVERY 6 HOURS AS NEEDED FOR WHEEZING OR SHORTNESS OF BREATH. 150 mL 1  . albuterol (VENTOLIN HFA) 108 (90 Base) MCG/ACT inhaler INHALE 1-2 PUFFS INTO THE LUNGS DAILY AS NEEDED FOR WHEEZING OR SHORTNESS OF BREATH. 18 g 1  . alendronate (FOSAMAX) 70 MG tablet TAKE 1 TABLET BY MOUTH EVERY 7 DAYS WITH FULL GLASS OF WATER ON EMPTY STOMACH 12 tablet 2  . ARIPiprazole (ABILIFY) 10 MG tablet Take 10 mg by mouth every morning.    . Azelastine HCl 0.15 % SOLN Place 1-2 sprays into both nostrils 2 (two) times daily. 90 mL 3  . Carbinoxamine Maleate 4 MG TABS Take 1 tablet (4 mg total) by mouth every 8 (eight) hours as needed. 180 tablet 1  . clonazePAM (KLONOPIN) 0.5 MG tablet 2 (two) times daily.     . [facility] D3 125 MCG (5000 UT) capsule TAKE 1 CAPSULE (5,000 UNITS TOTAL) BY MOUTH DAILY. 90 capsule 1  . dexamethasone 0.5 MG/5ML elixir Take 10 mLs (1 mg total) by mouth 4 (four) times daily as needed. 237 mL 1  . ELMIRON 100 MG capsule Take 100 mg by mouth 2 (two) times daily.   1  . estradiol (ESTRACE) 0.1 MG/GM vaginal cream PLACE 1 APPLICATORFUL VAGINALLY ONCE A WEEK. 42.5 g 4    . famotidine (PEPCID) 20 MG tablet Take 20 mg by mouth at bedtime.    . fluticasone (FLONASE) 50 MCG/ACT nasal spray PLACE 1 SPRAY INTO BOTH NOSTRILS 2 (TWO) TIMES DAILY AS NEEDED FOR ALLERGIES OR RHINITIS. 48 mL 1  . fluticasone (FLOVENT HFA) 220 MCG/ACT inhaler TAKE 2 PUFFS BY MOUTH TWICE A DAY 36 g 3  . hydrocortisone cream 1 % Apply 1 application topically 2 (two) times daily as needed for itching.     . INCRUSE ELLIPTA 62.5 MCG/INH AEPB TAKE 1 PUFF BY MOUTH EVERY DAY 30 each 1  . ketoconazole (NIZORAL) 2 % cream Apply topically daily.    Marland Kitchen levothyroxine (SYNTHROID) 50 MCG tablet TAKE 1 TABLET EVERY DAY IN THE MORNING 90 tablet 3  . metoprolol tartrate (LOPRESSOR) 25 MG tablet TAKE 1 TABLET BY MOUTH TWICE A DAY 180 tablet 1  . metroNIDAZOLE (METROCREAM) 0.75 % cream Apply 1 application topically 2 (two) times daily.    . montelukast (SINGULAIR) 10 MG tablet Take 1 tablet (10 mg total) by mouth at bedtime. 90 tablet 3  . MYRBETRIQ 25 MG TB24 tablet Take 25 mg by mouth daily.    . ondansetron (  ZOFRAN) 4 MG tablet Take 1 tablet (4 mg total) by mouth 2 (two) times daily as needed for nausea or vomiting. 15 tablet 0  . rizatriptan (MAXALT-MLT) 10 MG disintegrating tablet TAKE 1 TABLET EVERY DAY AS NEEDED FOR MIGRAINE, MAY REPEAT IN 2 HRS IF NEEDED. MAX 2 DOSES/WK 9 tablet 3  . rosuvastatin (CRESTOR) 20 MG tablet TAKE 1 TABLET BY MOUTH EVERY DAY 90 tablet 2  . sertraline (ZOLOFT) 100 MG tablet Take 200 mg by mouth at bedtime.     . sodium chloride (OCEAN) 0.65 % SOLN nasal spray Place 1 spray into both nostrils as needed for congestion.    . trihexyphenidyl (ARTANE) 2 MG tablet Take 2 mg by mouth daily.    Marland Kitchen zinc gluconate 50 MG tablet Take 50 mg by mouth daily.    Marland Kitchen zolpidem (AMBIEN) 5 MG tablet TAKE 1 TABLET BY MOUTH EVERYDAY AT BEDTIME    . amoxicillin-clavulanate (AUGMENTIN) 875-125 MG tablet Take 1 tablet by mouth 2 (two) times daily for 10 days. 20 tablet 0  . Olopatadine HCl (PATADAY) 0.2  % SOLN Place 1 drop into both eyes daily as needed. 2.5 mL 5   Current Facility-Administered Medications  Medication Dose Route Frequency Provider Last Rate Last Admin  . [START ON 06/26/2019] predniSONE (DELTASONE) tablet 10 mg  10 mg Oral Q breakfast Meranda Dechaine, [place] Muta, MD        Allergies  Allergen Reactions  . Emetrol Itching  . Indomethacin Other (See Comments)    Muscle spasms Causes muscle spasms in neck  . Iodinated Diagnostic Agents Itching    Flushed and Fever, itch all over  . Amlodipine Swelling    Peripheral edema  . Carbamazepine Other (See Comments)    Easy and unusual bleeding   . Pseudoephedrine Other (See Comments)    I fly off the walls  CANNOT SLEEP  . Risperidone And Related Other (See Comments)    Stomach upset, insomnia, drooling, tremors, "jerks," sensitivity to touch.   . Bupivacaine Hives  . Pentazocine Other (See Comments)    Unknown reaction MADE ME "LACTATE"  . Propoxyphene Itching and Nausea And Vomiting    darvocet  . Sulfa Antibiotics Other (See Comments)    Unknown reaction  . Tramadol Other (See Comments)    Patient can not remember  . Aspirin Other (See Comments)    upset stomach, ringing in the ears  . Codeine Itching and Other (See Comments)    Anything related to codeine  . Etodolac Rash and Other (See Comments)  . Propranolol Nausea Only and Other (See Comments)   Review of systems: Review of systems negative except as noted in HPI / PMHx.  Past Medical History:  Diagnosis Date  . Allergic rhinitis   . Anxiety   . Benign essential tremor    hands  . Bipolar 1 disorder, mixed, moderate ([place])   . Claustrophobia   . Depression   . Eczema   . Frequency of urination   . History of frequent urinary tract infections   . History of gastroesophageal reflux (GERD)    05-25-2017  per pt no issues since hiatal hernia repair 01/ 2018  . History of hiatal hernia   . History of melanoma excision    early 2000s-- BACK  . History  of panic attacks   . 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 Hypothyroidism   . Intermittent palpitations    cardiology--  dr Martinique  .  Interstitial cystitis   . Limited jaw range of motion    s/p  bilateral TMJ surgery,  age 43s  . Migraines   . Moderate persistent asthma    pulmologist-- dr Halford Chessman  . OA (osteoarthritis)    both knees  . OSA on CPAP    per last sleep study 09/ 2017  mild OSA, AHI13/hr  . PONV (postoperative nausea and vomiting)   . PVC (premature ventricular contraction)   . Rosacea   . Sensation of pressure in bladder area    per pt intermittant  . TMJ arthralgia   . Urticaria   . Wears glasses   . White coat syndrome without hypertension    05-25-2017  per pt hx hypertension yrs ago, no issues after quitting stressful job    Family History  Problem Relation Age of Onset  . Hypertension Mother        deceased from MVA complications  . Thyroid disease Mother   . Allergic rhinitis Mother   . Testicular cancer Father   . Allergic rhinitis Father   . Colon polyps Sister   . Coronary artery disease Other   . Healthy Brother   . Healthy Sister     Social History   Socioeconomic History  . Marital status: Divorced    Spouse name: Not on file  . Number of children: 0  . Years of education: Not on file  . Highest education level: Bachelor's degree (e.g., BA, AB, BS)  Occupational History  . Not on file  Tobacco Use  . Smoking status: Never Smoker  . Smokeless tobacco: Never Used  Substance and Sexual Activity  . Alcohol use: Yes    Alcohol/week: 0.0 standard drinks    Comment: once every 3-4 months  . Drug use: No  . Sexual activity: Not on file  Other Topics Concern  . Not on file  Social History Narrative  . Not on file   Social Determinants of Health   Financial Resource Strain:   . Difficulty of Paying Living Expenses:   Food Insecurity:   . Worried About Charity fundraiser in the Last Year:   . Academic librarian in the Last Year:   Transportation Needs:   . Film/video editor (Medical):   Marland Kitchen Lack of Transportation (Non-Medical):   Physical Activity:   . Days of Exercise per Week:   . Minutes of Exercise per Session:   Stress:   . Feeling of Stress :   Social Connections:   . Frequency of Communication with Friends and Family:   . Frequency of Social Gatherings with Friends and Family:   . Attends Religious Services:   . Active Member of Clubs or Organizations:   . Attends Archivist Meetings:   Marland Kitchen Marital Status:   Intimate Partner Violence:   . Fear of Current or Ex-Partner:   . Emotionally Abused:   Marland Kitchen Physically Abused:   . Sexually Abused:     I appreciate the opportunity to take part in Kalisa's care. Please do not hesitate to contact me with questions.  Sincerely,   R. Edgar Frisk, MD

## 2019-06-25 NOTE — Assessment & Plan Note (Signed)
   Continue appropriate allergen avoidance measures.  Fluticasone nasal spray, azelastine nasal spray, and nasal saline lavage (as above).  Continue carbinoxamine as needed.  Continue montelukast 10 mg daily at bedtime.

## 2019-06-25 NOTE — Assessment & Plan Note (Addendum)
   Prednisone has been provided, 40 mg x3 days, 20 mg x1 day, 10 mg x1 day, then stop.  A prescription has been provided for Augmentin 875-125 mg, 1 p.o. twice daily x10 days.  Fluticasone nasal spray, 2 sprays per nostril daily for now.  Azelastine nasal spray, 1-2 sprays per nostril twice daily for now.  Nasal saline lavage (NeilMed) has been recommended as needed and prior to medicated nasal sprays along with instructions for proper administration.  For thick post nasal drainage, add guaifenesin 1200 mg (Mucinex Maximum Strength)  twice daily as needed with adequate hydration as discussed.

## 2019-06-27 DIAGNOSIS — M19011 Primary osteoarthritis, right shoulder: Secondary | ICD-10-CM | POA: Diagnosis not present

## 2019-06-27 DIAGNOSIS — M654 Radial styloid tenosynovitis [de Quervain]: Secondary | ICD-10-CM | POA: Diagnosis not present

## 2019-06-27 DIAGNOSIS — M180 Bilateral primary osteoarthritis of first carpometacarpal joints: Secondary | ICD-10-CM | POA: Diagnosis not present

## 2019-07-06 DIAGNOSIS — G4733 Obstructive sleep apnea (adult) (pediatric): Secondary | ICD-10-CM | POA: Diagnosis not present

## 2019-07-20 ENCOUNTER — Encounter: Payer: Medicare HMO | Admitting: Family Medicine

## 2019-07-22 ENCOUNTER — Other Ambulatory Visit: Payer: Self-pay | Admitting: Family Medicine

## 2019-07-22 DIAGNOSIS — G43809 Other migraine, not intractable, without status migrainosus: Secondary | ICD-10-CM

## 2019-07-24 DIAGNOSIS — R69 Illness, unspecified: Secondary | ICD-10-CM | POA: Diagnosis not present

## 2019-07-26 ENCOUNTER — Other Ambulatory Visit: Payer: Self-pay

## 2019-07-26 DIAGNOSIS — F410 Panic disorder [episodic paroxysmal anxiety] without agoraphobia: Secondary | ICD-10-CM | POA: Diagnosis not present

## 2019-07-26 DIAGNOSIS — R69 Illness, unspecified: Secondary | ICD-10-CM | POA: Diagnosis not present

## 2019-07-26 DIAGNOSIS — F411 Generalized anxiety disorder: Secondary | ICD-10-CM | POA: Diagnosis not present

## 2019-07-27 ENCOUNTER — Ambulatory Visit (INDEPENDENT_AMBULATORY_CARE_PROVIDER_SITE_OTHER): Payer: Medicare HMO | Admitting: Family Medicine

## 2019-07-27 ENCOUNTER — Telehealth: Payer: Self-pay | Admitting: Family Medicine

## 2019-07-27 ENCOUNTER — Encounter: Payer: Self-pay | Admitting: Family Medicine

## 2019-07-27 VITALS — BP 124/70 | HR 81 | Temp 97.2°F | Resp 12 | Ht 59.0 in

## 2019-07-27 DIAGNOSIS — K148 Other diseases of tongue: Secondary | ICD-10-CM | POA: Diagnosis not present

## 2019-07-27 DIAGNOSIS — G2111 Neuroleptic induced parkinsonism: Secondary | ICD-10-CM

## 2019-07-27 DIAGNOSIS — E559 Vitamin D deficiency, unspecified: Secondary | ICD-10-CM | POA: Diagnosis not present

## 2019-07-27 DIAGNOSIS — I10 Essential (primary) hypertension: Secondary | ICD-10-CM

## 2019-07-27 DIAGNOSIS — R600 Localized edema: Secondary | ICD-10-CM

## 2019-07-27 DIAGNOSIS — F3111 Bipolar disorder, current episode manic without psychotic features, mild: Secondary | ICD-10-CM

## 2019-07-27 DIAGNOSIS — E039 Hypothyroidism, unspecified: Secondary | ICD-10-CM | POA: Diagnosis not present

## 2019-07-27 DIAGNOSIS — R69 Illness, unspecified: Secondary | ICD-10-CM | POA: Diagnosis not present

## 2019-07-27 DIAGNOSIS — T43505A Adverse effect of unspecified antipsychotics and neuroleptics, initial encounter: Secondary | ICD-10-CM | POA: Insufficient documentation

## 2019-07-27 LAB — BASIC METABOLIC PANEL
BUN: 18 mg/dL (ref 6–23)
CO2: 27 mEq/L (ref 19–32)
Calcium: 10 mg/dL (ref 8.4–10.5)
Chloride: 104 mEq/L (ref 96–112)
Creatinine, Ser: 0.85 mg/dL (ref 0.40–1.20)
GFR: 67.61 mL/min (ref >60.00)
Glucose, Bld: 114 mg/dL — ABNORMAL HIGH (ref 70–99)
Potassium: 4.5 mEq/L (ref 3.5–5.1)
Sodium: 139 mEq/L (ref 135–145)

## 2019-07-27 LAB — TSH: TSH: 0.73 u[IU]/mL (ref 0.35–4.50)

## 2019-07-27 LAB — VITAMIN D 25 HYDROXY (VIT D DEFICIENCY, FRACTURES): VITD: 50.41 ng/mL (ref 30.00–100.00)

## 2019-07-27 NOTE — Patient Instructions (Signed)
A few things to remember from today's visit:   Swelling of legs seems to be related to varicose veins. Elevation and/or compression stocking may help.  Continue following with psychiatrist and ortho.   Varicose Veins Varicose veins are veins that have become enlarged, bulged, and twisted. They most often appear in the legs. What are the causes? This condition is caused by damage to the valves in the vein. These valves help blood return to your heart. When they are damaged and they stop working properly, blood may flow backward and back up in the veins near the skin, causing the veins to get larger and appear twisted. The condition can result from any issue that causes blood to back up, like pregnancy, prolonged standing, or obesity. What increases the risk? This condition is more likely to develop in people who are:  On their feet a lot.  Pregnant.  Overweight. What are the signs or symptoms? Symptoms of this condition include:  Bulging, twisted, and bluish veins.  A feeling of heaviness. This may be worse at the end of the day.  Leg pain. This may be worse at the end of the day.  Swelling in the leg.  Changes in skin color over the veins. How is this diagnosed? This condition may be diagnosed based on your symptoms, a physical exam, and an ultrasound test. How is this treated? Treatment for this condition may involve:  Avoiding sitting or standing in one position for long periods of time.  Wearing compression stockings. These stockings help to prevent blood clots and reduce swelling in the legs.  Raising (elevating) the legs when resting.  Losing weight.  Exercising regularly. If you have persistent symptoms or want to improve the way your varicose veins look, you may choose to have a procedure to close the varicose veins off or to remove them. Treatments to close off the veins include:  Sclerotherapy. In this treatment, a solution is injected into a vein to close  it off.  Laser treatment. In this treatment, the vein is heated with a laser to close it off.  Radiofrequency vein ablation. In this treatment, an electrical current produced by radio waves is used to close off the vein. Treatments to remove the veins include:  Phlebectomy. In this treatment, the veins are removed through small incisions made over the veins.  Vein ligation and stripping. In this treatment, incisions are made over the veins. The veins are then removed after being tied (ligated) with stitches (sutures). Follow these instructions at home: Activity  Walk as much as possible. Walking increases blood flow. This helps blood return to the heart and takes pressure off your veins. It also increases your cardiovascular strength.  Follow your health care provider's instructions about exercising.  Do not stand or sit in one position for a long period of time.  Do not sit with your legs crossed.  Rest with your legs raised during the day. General instructions   Follow any diet instructions given to you by your health care provider.  Wear compression stockings as directed by your health care provider. Do not wear other kinds of tight clothing around your legs, pelvis, or waist.  Elevate your legs at night to above the level of your heart.  If you get a cut in the skin over the varicose vein and the vein bleeds: ? Lie down with your leg raised. ? Apply firm pressure to the cut with a clean cloth until the bleeding stops. ? Place a bandage (  dressing) on the cut. Contact a health care provider if:  The skin around your varicose veins starts to break down.  You have pain, redness, tenderness, or hard swelling over a vein.  You are uncomfortable because of pain.  You get a cut in the skin over a varicose vein and it will not stop bleeding. Summary  Varicose veins are veins that have become enlarged, bulged, and twisted. They most often appear in the legs.  This condition  is caused by damage to the valves in the vein. These valves help blood return to your heart.  Treatment for this condition includes frequent movements, wearing compression stockings, losing weight, and exercising regularly. In some cases, procedures are done to close off or remove the veins.  Treatment for this condition may include wearing compression stockings, elevating the legs, losing weight, and engaging in regular activity. In some cases, procedures are done to close off or remove the veins. This information is not intended to replace advice given to you by your health care provider. Make sure you discuss any questions you have with your health care provider. Document Revised: 03/23/2018 Document Reviewed: 02/18/2016 Elsevier Patient Education  2020 [employer].  If you need refills please call your pharmacy. Do not use My Chart to request refills or for acute issues that need immediate attention.    Please be sure medication list is accurate. If a new problem present, please set up appointment sooner than planned today.

## 2019-07-27 NOTE — Progress Notes (Signed)
ACUTE VISIT Chief Complaint  Patient presents with  . Sore spot on tongue    has had for several months, very tender on the left side of tongue  . Foot Swelling    bilateral feet and legs swelling started a couple of weeks ago   HPI: [name] is a 64 y.o. female, who is here today with above concerns. LE edema noted a couple weeks ago. Stable. It is worse at the end of the day and better in the morning. She has not tried OTC medications/treatments.  Negative for erythema or pain. No numbness,tingling,or burning sensation. Negative for CP,SOB,PND,orthopnea,decreased urine output,gross hematuria,or foam in urine.  HTN on Metoprolol Tartrate 25 mg bid.  Mildly abnormal LFT's. No hx of high alcohol intake.  Lab Results  Component Value Date   ALT 36 (A) 05/23/2019   AST 31 05/23/2019   ALKPHOS 78 05/23/2019   BILITOT 0.4 04/28/2018   Hypothyroidism: She is on Levothyroxine 50 mcg daily.  Lab Results  Component Value Date   TSH 0.54 04/28/2018   She is following with her psychiatrist q 3 months.  She is also concerned about lesion on left side of tongue.  It is intermittent, sore. Not sure about exacerbating or alleviating factors. Evaluated for this problem on 05/18/19, treatment did not help. Negative for fever,sore throat, night sweats,or abnormal wt loss.  No hx of tobacco use.  Vit D deficiency: She is on Vit D3 5000 U daily., Last 56 OH vit D was 93.4 in 10/2018.  + Tremor, hx of neuroleptic induced parkinsonism. She follows with Dr Tat. Follows with psychiatrist regularly, bipolar disorder.  Review of Systems  Constitutional: Positive for fatigue. Negative for activity change and appetite change.  HENT: Negative for nosebleeds and trouble swallowing.   Eyes: Negative for redness and visual disturbance.  Respiratory: Negative for cough and wheezing.   Cardiovascular: Negative for chest pain and palpitations.  Gastrointestinal: Negative  for abdominal pain, nausea and vomiting.       Negative for changes in bowel habits.  Genitourinary: Negative for dysuria.  Musculoskeletal: Positive for arthralgias. Negative for gait problem.  Skin: Negative for pallor and wound.  Allergic/Immunologic: Positive for environmental allergies.  Rest see pertinent positives and negatives per HPI.  Current Outpatient Medications on File Prior to Visit  Medication Sig Dispense Refill  . acetaminophen (TYLENOL) 500 MG tablet Take 500 mg by mouth every 6 (six) hours as needed.    Marland Kitchen albuterol (PROVENTIL) (2.5 MG/3ML) 0.083% nebulizer solution TAKE 3 MLS BY NEBULIZATION EVERY 6 HOURS AS NEEDED FOR WHEEZING OR SHORTNESS OF BREATH. 150 mL 1  . albuterol (VENTOLIN HFA) 108 (90 Base) MCG/ACT inhaler INHALE 1-2 PUFFS INTO THE LUNGS DAILY AS NEEDED FOR WHEEZING OR SHORTNESS OF BREATH. 18 g 1  . alendronate (FOSAMAX) 70 MG tablet TAKE 1 TABLET BY MOUTH EVERY 7 DAYS WITH FULL GLASS OF WATER ON EMPTY STOMACH 12 tablet 2  . ARIPiprazole (ABILIFY) 10 MG tablet Take 10 mg by mouth every morning.    . Azelastine HCl 0.15 % SOLN Place 1-2 sprays into both nostrils 2 (two) times daily. 90 mL 3  . Carbinoxamine Maleate 4 MG TABS Take 1 tablet (4 mg total) by mouth every 8 (eight) hours as needed. 180 tablet 1  . clonazePAM (KLONOPIN) 0.5 MG tablet 2 (two) times daily.     . [facility] D3 125 MCG (5000 UT) capsule TAKE 1 CAPSULE (5,000 UNITS TOTAL) BY MOUTH DAILY. 90 capsule  1  . estradiol (ESTRACE) 0.1 MG/GM vaginal cream PLACE 1 APPLICATORFUL VAGINALLY ONCE A WEEK. 42.5 g 4  . famotidine (PEPCID) 20 MG tablet Take 20 mg by mouth at bedtime.    . fluticasone (FLONASE) 50 MCG/ACT nasal spray PLACE 1 SPRAY INTO BOTH NOSTRILS 2 (TWO) TIMES DAILY AS NEEDED FOR ALLERGIES OR RHINITIS. 48 mL 1  . fluticasone (FLOVENT HFA) 220 MCG/ACT inhaler TAKE 2 PUFFS BY MOUTH TWICE A DAY 36 g 3  . hydrocortisone cream 1 % Apply 1 application topically 2 (two) times daily as needed for itching.      . INCRUSE ELLIPTA 62.5 MCG/INH AEPB TAKE 1 PUFF BY MOUTH EVERY DAY 30 each 1  . ketoconazole (NIZORAL) 2 % cream Apply topically daily.    Marland Kitchen levothyroxine (SYNTHROID) 50 MCG tablet TAKE 1 TABLET EVERY DAY IN THE MORNING 90 tablet 3  . metoprolol tartrate (LOPRESSOR) 25 MG tablet TAKE 1 TABLET BY MOUTH TWICE A DAY 180 tablet 1  . metroNIDAZOLE (METROCREAM) 0.75 % cream Apply 1 application topically 2 (two) times daily.    . montelukast (SINGULAIR) 10 MG tablet Take 1 tablet (10 mg total) by mouth at bedtime. 90 tablet 3  . MYRBETRIQ 25 MG TB24 tablet Take 25 mg by mouth daily.    . Olopatadine HCl (PATADAY) 0.2 % SOLN Place 1 drop into both eyes daily as needed. 2.5 mL 5  . ondansetron (ZOFRAN) 4 MG tablet Take 1 tablet (4 mg total) by mouth 2 (two) times daily as needed for nausea or vomiting. 15 tablet 0  . rizatriptan (MAXALT-MLT) 10 MG disintegrating tablet TAKE 1 TABLET EVERY DAY AS NEEDED FOR MIGRAINE, MAY REPEAT IN 2 HRS IF NEEDED. MAX 2 DOSES/WK 9 tablet 2  . rosuvastatin (CRESTOR) 20 MG tablet TAKE 1 TABLET BY MOUTH EVERY DAY 90 tablet 2  . sertraline (ZOLOFT) 100 MG tablet Take 200 mg by mouth at bedtime.     . sodium chloride (OCEAN) 0.65 % SOLN nasal spray Place 1 spray into both nostrils as needed for congestion.    . trihexyphenidyl (ARTANE) 2 MG tablet Take 2 mg by mouth daily.    Marland Kitchen zinc gluconate 50 MG tablet Take 50 mg by mouth daily.    Marland Kitchen zolpidem (AMBIEN) 5 MG tablet TAKE 1 TABLET BY MOUTH EVERYDAY AT BEDTIME    . ELMIRON 100 MG capsule Take 100 mg by mouth 2 (two) times daily.  (Patient not taking: Reported on 07/27/2019)  1   Current Facility-Administered Medications on File Prior to Visit  Medication Dose Route Frequency Provider Last Rate Last Admin  . predniSONE (DELTASONE) tablet 10 mg  10 mg Oral Q breakfast Bobbitt, [place] Muta, MD        Past Medical History:  Diagnosis Date  . Allergic rhinitis   . Anxiety   . Benign essential tremor    hands  . Bipolar 1  disorder, mixed, moderate ([place])   . Claustrophobia   . Depression   . Eczema   . Frequency of urination   . History of frequent urinary tract infections   . History of gastroesophageal reflux (GERD)    05-25-2017  per pt no issues since hiatal hernia repair 01/ 2018  . History of hiatal hernia   . History of melanoma excision    early 2000s-- BACK  . History of panic attacks   . 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 Hypothyroidism   . Intermittent palpitations  cardiology--  dr Martinique  . Interstitial cystitis   . Limited jaw range of motion    s/p  bilateral TMJ surgery,  age 20s  . Migraines   . Moderate persistent asthma    pulmologist-- dr Halford Chessman  . OA (osteoarthritis)    both knees  . OSA on CPAP    per last sleep study 09/ 2017  mild OSA, AHI13/hr  . PONV (postoperative nausea and vomiting)   . PVC (premature ventricular contraction)   . Rosacea   . Sensation of pressure in bladder area    per pt intermittant  . TMJ arthralgia   . Urticaria   . Wears glasses   . White coat syndrome without hypertension    05-25-2017  per pt hx hypertension yrs ago, no issues after quitting stressful job   Allergies  Allergen Reactions  . Emetrol Itching  . Indomethacin Other (See Comments)    Muscle spasms Causes muscle spasms in neck  . Iodinated Diagnostic Agents Itching    Flushed and Fever, itch all over  . Amlodipine Swelling    Peripheral edema  . Carbamazepine Other (See Comments)    Easy and unusual bleeding   . Pseudoephedrine Other (See Comments)    I fly off the walls  CANNOT SLEEP  . Risperidone And Related Other (See Comments)    Stomach upset, insomnia, drooling, tremors, "jerks," sensitivity to touch.   . Bupivacaine Hives  . Pentazocine Other (See Comments)    Unknown reaction MADE ME "LACTATE"  . Propoxyphene Itching and Nausea And Vomiting    darvocet  . Sulfa Antibiotics Other (See Comments)    Unknown reaction  .  Tramadol Other (See Comments)    Patient can not remember  . Aspirin Other (See Comments)    upset stomach, ringing in the ears  . Codeine Itching and Other (See Comments)    Anything related to codeine  . Etodolac Rash and Other (See Comments)  . Propranolol Nausea Only and Other (See Comments)    Social History   Socioeconomic History  . Marital status: Divorced    Spouse name: Not on file  . Number of children: 0  . Years of education: Not on file  . Highest education level: Bachelor's degree (e.g., BA, AB, BS)  Occupational History  . Not on file  Tobacco Use  . Smoking status: Never Smoker  . Smokeless tobacco: Never Used  Vaping Use  . Vaping Use: Never used  Substance and Sexual Activity  . Alcohol use: Yes    Alcohol/week: 0.0 standard drinks    Comment: once every 3-4 months  . Drug use: No  . Sexual activity: Not on file  Other Topics Concern  . Not on file  Social History Narrative  . Not on file   Social Determinants of Health   Financial Resource Strain:   . Difficulty of Paying Living Expenses:   Food Insecurity:   . Worried About Charity fundraiser in the Last Year:   . Arboriculturist in the Last Year:   Transportation Needs:   . Film/video editor (Medical):   Marland Kitchen Lack of Transportation (Non-Medical):   Physical Activity:   . Days of Exercise per Week:   . Minutes of Exercise per Session:   Stress:   . Feeling of Stress :   Social Connections:   . Frequency of Communication with Friends and Family:   . Frequency of Social Gatherings with Friends and Family:   .  Attends Religious Services:   . Active Member of Clubs or Organizations:   . Attends Archivist Meetings:   Marland Kitchen Marital Status:     Vitals:   07/27/19 1116  BP: 124/70  Pulse: 81  Resp: 12  Temp: (!) 97.2 F (36.2 C)  SpO2: 95%   Body mass index is 35.55 kg/m.  Physical Exam  Nursing note and vitals reviewed. Constitutional: She is oriented to person, place,  and time. She appears well-developed. No distress.  HENT:  Head: Normocephalic and atraumatic.  Mouth/Throat: Uvula is midline. Mucous membranes are moist. No oral lesions. Oropharynx is clear.  Eyes: Pupils are equal, round, and reactive to light. Conjunctivae are normal.  Cardiovascular: Normal rate and regular rhythm.  No murmur heard. Pulses:      Dorsalis pedis pulses are 2+ on the right side and 2+ on the left side.  Small varicose veins in LE's.  Respiratory: Effort normal and breath sounds normal. No respiratory distress.  GI: Soft. She exhibits no mass. There is no hepatomegaly. There is no abdominal tenderness.  Musculoskeletal:     Right lower leg: Edema present.     Comments: Trace pitting edema LE,bilateral. 1+ pitting edema pedal ,bilateral.  Lymphadenopathy:    She has no cervical adenopathy.  Neurological: She is alert and oriented to person, place, and time. She displays tremor (Hands and head,mild). No cranial nerve deficit.  Stable gait, not assisted.  Skin: Skin is warm. No rash noted. No erythema.  Psychiatric: Her mood appears anxious. She has a flat affect.  Well groomed, good eye contact.   ASSESSMENT AND PLAN:  Ms.Jenese was seen today for sore spot on tongue and foot swelling.  Diagnoses and all orders for this visit: Orders Placed This Encounter  Procedures  . Basic metabolic panel  . TSH  . VITAMIN D 25 Hydroxy (Vit-D Deficiency, Fractures)   Lab Results  Component Value Date   CREATININE 0.85 07/27/2019   BUN 18 07/27/2019   NA 139 07/27/2019   K 4.5 07/27/2019   CL 104 07/27/2019   CO2 27 07/27/2019   Lab Results  Component Value Date   TSH 0.73 07/27/2019   Bilateral lower extremity edema We discussed possible etiologies. Hx and findings on examination today does not suggest a serious process. Most likely caused by vein disease. Recommend LE elevation,compression stocking. Adequate skin care.  We could try diuretics prn if problem  is not improved with compression stocking.  Essential hypertension BP adequately controlled. No changes in current management. Continue low salt diet.  Hypothyroidism, unspecified type Continue Levothyroxine 50 mcg daily. Further recommendations according to results.  Lesion of tongue Today I did not appreciate tongue lesions. ? Aphthous. She has an appt with her dentist next week, instructed to mention problem.  Vitamin D deficiency, unspecified No changes in current management, will follow 42 OH vit D and will give further recommendations accordingly.  Neuroleptic-induced Parkinsonism [facility]) Following with neurologist.  Bipolar affective disorder, currently manic, mild ([place]) Following with psychiatrist.    Return in about 4 months (around 11/26/2019).   Mindel Friscia G. Martinique, MD  [facility]. [place] office.  Discharge Instructions   None     A few things to remember from today's visit:   Swelling of legs seems to be related to varicose veins. Elevation and/or compression stocking may help.  Continue following with psychiatrist and ortho.   Varicose Veins Varicose veins are veins that have become enlarged, bulged, and twisted. They  most often appear in the legs. What are the causes? This condition is caused by damage to the valves in the vein. These valves help blood return to your heart. When they are damaged and they stop working properly, blood may flow backward and back up in the veins near the skin, causing the veins to get larger and appear twisted. The condition can result from any issue that causes blood to back up, like pregnancy, prolonged standing, or obesity. What increases the risk? This condition is more likely to develop in people who are:  On their feet a lot.  Pregnant.  Overweight. What are the signs or symptoms? Symptoms of this condition include:  Bulging, twisted, and bluish veins.  A feeling of heaviness. This may be worse  at the end of the day.  Leg pain. This may be worse at the end of the day.  Swelling in the leg.  Changes in skin color over the veins. How is this diagnosed? This condition may be diagnosed based on your symptoms, a physical exam, and an ultrasound test. How is this treated? Treatment for this condition may involve:  Avoiding sitting or standing in one position for long periods of time.  Wearing compression stockings. These stockings help to prevent blood clots and reduce swelling in the legs.  Raising (elevating) the legs when resting.  Losing weight.  Exercising regularly. If you have persistent symptoms or want to improve the way your varicose veins look, you may choose to have a procedure to close the varicose veins off or to remove them. Treatments to close off the veins include:  Sclerotherapy. In this treatment, a solution is injected into a vein to close it off.  Laser treatment. In this treatment, the vein is heated with a laser to close it off.  Radiofrequency vein ablation. In this treatment, an electrical current produced by radio waves is used to close off the vein. Treatments to remove the veins include:  Phlebectomy. In this treatment, the veins are removed through small incisions made over the veins.  Vein ligation and stripping. In this treatment, incisions are made over the veins. The veins are then removed after being tied (ligated) with stitches (sutures). Follow these instructions at home: Activity  Walk as much as possible. Walking increases blood flow. This helps blood return to the heart and takes pressure off your veins. It also increases your cardiovascular strength.  Follow your health care provider's instructions about exercising.  Do not stand or sit in one position for a long period of time.  Do not sit with your legs crossed.  Rest with your legs raised during the day. General instructions   Follow any diet instructions given to you by  your health care provider.  Wear compression stockings as directed by your health care provider. Do not wear other kinds of tight clothing around your legs, pelvis, or waist.  Elevate your legs at night to above the level of your heart.  If you get a cut in the skin over the varicose vein and the vein bleeds: ? Lie down with your leg raised. ? Apply firm pressure to the cut with a clean cloth until the bleeding stops. ? Place a bandage (dressing) on the cut. Contact a health care provider if:  The skin around your varicose veins starts to break down.  You have pain, redness, tenderness, or hard swelling over a vein.  You are uncomfortable because of pain.  You get a cut in the skin  over a varicose vein and it will not stop bleeding. Summary  Varicose veins are veins that have become enlarged, bulged, and twisted. They most often appear in the legs.  This condition is caused by damage to the valves in the vein. These valves help blood return to your heart.  Treatment for this condition includes frequent movements, wearing compression stockings, losing weight, and exercising regularly. In some cases, procedures are done to close off or remove the veins.  Treatment for this condition may include wearing compression stockings, elevating the legs, losing weight, and engaging in regular activity. In some cases, procedures are done to close off or remove the veins. This information is not intended to replace advice given to you by your health care provider. Make sure you discuss any questions you have with your health care provider. Document Revised: 03/23/2018 Document Reviewed: 02/18/2016 Elsevier Patient Education  2020 [employer].  If you need refills please call your pharmacy. Do not use My Chart to request refills or for acute issues that need immediate attention.    Please be sure medication list is accurate. If a new problem present, please set up appointment sooner than  planned today.

## 2019-07-27 NOTE — Telephone Encounter (Signed)
Pt forgot to mention during her visit that she is no longer taking Elmiron 100 mg. Thanks

## 2019-07-27 NOTE — Telephone Encounter (Signed)
Chart updated

## 2019-07-30 ENCOUNTER — Other Ambulatory Visit: Payer: Self-pay | Admitting: *Deleted

## 2019-07-30 MED ORDER — FUROSEMIDE 20 MG PO TABS
20.0000 mg | ORAL_TABLET | Freq: Every day | ORAL | 0 refills | Status: DC | PRN
Start: 2019-07-30 — End: 2019-08-28

## 2019-08-06 DIAGNOSIS — G4733 Obstructive sleep apnea (adult) (pediatric): Secondary | ICD-10-CM | POA: Diagnosis not present

## 2019-08-07 ENCOUNTER — Other Ambulatory Visit: Payer: Self-pay | Admitting: Family Medicine

## 2019-08-07 DIAGNOSIS — E559 Vitamin D deficiency, unspecified: Secondary | ICD-10-CM

## 2019-08-10 ENCOUNTER — Encounter: Payer: Medicare HMO | Admitting: Family Medicine

## 2019-08-14 ENCOUNTER — Other Ambulatory Visit: Payer: Self-pay | Admitting: Allergy and Immunology

## 2019-08-23 DIAGNOSIS — F422 Mixed obsessional thoughts and acts: Secondary | ICD-10-CM | POA: Diagnosis not present

## 2019-08-23 DIAGNOSIS — F423 Hoarding disorder: Secondary | ICD-10-CM | POA: Diagnosis not present

## 2019-08-23 DIAGNOSIS — R69 Illness, unspecified: Secondary | ICD-10-CM | POA: Diagnosis not present

## 2019-08-27 ENCOUNTER — Other Ambulatory Visit: Payer: Self-pay | Admitting: Family Medicine

## 2019-09-05 DIAGNOSIS — G4733 Obstructive sleep apnea (adult) (pediatric): Secondary | ICD-10-CM | POA: Diagnosis not present

## 2019-09-07 DIAGNOSIS — M25562 Pain in left knee: Secondary | ICD-10-CM | POA: Diagnosis not present

## 2019-09-11 ENCOUNTER — Encounter: Payer: Self-pay | Admitting: Family Medicine

## 2019-09-11 ENCOUNTER — Other Ambulatory Visit: Payer: Self-pay

## 2019-09-11 ENCOUNTER — Ambulatory Visit (INDEPENDENT_AMBULATORY_CARE_PROVIDER_SITE_OTHER): Payer: Medicare HMO | Admitting: Family Medicine

## 2019-09-11 VITALS — BP 120/78 | HR 82 | Temp 97.4°F | Resp 16 | Ht 59.0 in | Wt 176.2 lb

## 2019-09-11 DIAGNOSIS — E782 Mixed hyperlipidemia: Secondary | ICD-10-CM

## 2019-09-11 DIAGNOSIS — W19XXXA Unspecified fall, initial encounter: Secondary | ICD-10-CM

## 2019-09-11 DIAGNOSIS — R7303 Prediabetes: Secondary | ICD-10-CM | POA: Diagnosis not present

## 2019-09-11 DIAGNOSIS — I10 Essential (primary) hypertension: Secondary | ICD-10-CM

## 2019-09-11 DIAGNOSIS — Z0000 Encounter for general adult medical examination without abnormal findings: Secondary | ICD-10-CM | POA: Diagnosis not present

## 2019-09-11 DIAGNOSIS — N644 Mastodynia: Secondary | ICD-10-CM | POA: Diagnosis not present

## 2019-09-11 DIAGNOSIS — E039 Hypothyroidism, unspecified: Secondary | ICD-10-CM

## 2019-09-11 NOTE — Progress Notes (Signed)
HPI: [name] is a 64 y.o. female, who is here today for her routine physical.  Last CPE: 10/24/17  Regular exercise 3 or more time per week: She is not exercising regularly, some activities exacerbate joint pain. Following a healthful diet: She is trying to follow a healthier diet. Decreased sugar and fat intake. She lives alone. She keeps communication with her 2 sisters.  Chronic medical problems: Bipolar disorder,HTN,vit D deficiency,hypothyroidism,and generalized OA among some.  Pap smear:10/24/2017 negative for malignancy and HPV was not detected. Hx of abnormal pap smears: Negative. Hx of STD's: Negative.  Immunization History  Administered Date(s) Administered  . Influenza Split 02/09/2012  . Influenza Whole 12/08/2006, 12/01/2007  . Influenza,inj,Quad PF,6+ Mos 12/11/2013, 11/21/2014, 10/13/2016, 10/24/2017, 10/31/2018  . Influenza-Unspecified 12/01/2015  . Moderna SARS-COVID-2 Vaccination 04/28/2019, 05/29/2019  . Pneumococcal Polysaccharide-23 12/12/2013  . Tdap 04/16/2011, 06/25/2013  . Zoster Recombinat (Shingrix) 05/30/2018, 08/16/2018   Mammogram: 09/12/18 Bi-Rads I Colonoscopy: 04/02/14,10 years f/u was recommended. DEXA: 03/19/16 osteoporosis. She is on Fosamax 70 mg weekly. Hep C screening: 11/21/14.  Right wrist pain after a fall last night when she was walking around her house, 11:00 pm. She tried to kick a ball,slid and fell, landed on her right side. Achy RLE muscles and wrist. She does not think she has a serious injury. Generalized OA. Negative for deformity or ecchymosis around affected areas.  HTN: She is on Metoprolol Tartrate 25 mg bid. No CP, SOB,or focal weakness.       Lab Results  Component Value Date   CREATININE 0.85 07/27/2019   BUN 18 07/27/2019   NA 139 07/27/2019   K 4.5 07/27/2019   CL 104 07/27/2019   CO2 27 07/27/2019    HLD: She is on Rosuvastatin 20 mg daily. 09/2018: TC 185 LDL 96 TG 112 HDL  68.  Prediabetes:  Last HgA1C in 10/2018 was 6.1. Negative for polydipsia,polyuria, or polyphagia   Review of Systems  Constitutional: Negative for appetite change, fatigue and fever.  HENT: Negative for dental problem, hearing loss, mouth sores and sore throat.   Eyes: Negative for redness and visual disturbance.  Respiratory: Negative for cough, shortness of breath and wheezing.   Cardiovascular: Negative for chest pain and leg swelling.  Gastrointestinal: Negative for abdominal pain, nausea and vomiting.       No changes in bowel habits.  Endocrine: Negative for cold intolerance, heat intolerance, polydipsia, polyphagia and polyuria.  Genitourinary: Negative for decreased urine volume, dysuria, hematuria, vaginal bleeding and vaginal discharge.  Musculoskeletal: Positive for arthralgias. Negative for gait problem and myalgias.  Skin: Negative for color change and rash.  Allergic/Immunologic: Positive for environmental allergies.  Neurological: Negative for syncope, weakness and headaches.  Hematological: Negative for adenopathy. Does not bruise/bleed easily.  Psychiatric/Behavioral: Negative for confusion and hallucinations. The patient is nervous/anxious.   All other systems reviewed and are negative.  Current Outpatient Medications on File Prior to Visit  Medication Sig Dispense Refill  . acetaminophen (TYLENOL) 500 MG tablet Take 500 mg by mouth every 6 (six) hours as needed.    Marland Kitchen albuterol (PROVENTIL) (2.5 MG/3ML) 0.083% nebulizer solution TAKE 3 MLS BY NEBULIZATION EVERY 6 HOURS AS NEEDED FOR WHEEZING OR SHORTNESS OF BREATH. 150 mL 1  . albuterol (VENTOLIN HFA) 108 (90 Base) MCG/ACT inhaler INHALE 1-2 PUFFS INTO THE LUNGS DAILY AS NEEDED FOR WHEEZING OR SHORTNESS OF BREATH. 18 g 1  . alendronate (FOSAMAX) 70 MG tablet TAKE 1 TABLET BY MOUTH EVERY 7  DAYS WITH FULL GLASS OF WATER ON EMPTY STOMACH 12 tablet 2  . ARIPiprazole (ABILIFY) 10 MG tablet Take 10 mg by mouth every  morning.    . Azelastine HCl 0.15 % SOLN Place 1-2 sprays into both nostrils 2 (two) times daily. 90 mL 3  . Carbinoxamine Maleate 4 MG TABS Take 1 tablet (4 mg total) by mouth every 8 (eight) hours as needed. 180 tablet 1  . clonazePAM (KLONOPIN) 0.5 MG tablet 2 (two) times daily.     . [employer] D3 125 MCG (5000 UT) capsule TAKE 1 CAPSULE (5,000 UNITS TOTAL) BY MOUTH DAILY. 90 capsule 3  . estradiol (ESTRACE) 0.1 MG/GM vaginal cream PLACE 1 APPLICATORFUL VAGINALLY ONCE A WEEK. 42.5 g 4  . famotidine (PEPCID) 20 MG tablet Take 20 mg by mouth at bedtime.    . fluticasone (FLONASE) 50 MCG/ACT nasal spray PLACE 1 SPRAY INTO BOTH NOSTRILS 2 (TWO) TIMES DAILY AS NEEDED FOR ALLERGIES OR RHINITIS. 48 mL 1  . fluticasone (FLOVENT HFA) 220 MCG/ACT inhaler TAKE 2 PUFFS BY MOUTH TWICE A DAY 36 g 3  . furosemide (LASIX) 20 MG tablet TAKE 1 TABLET (20 MG TOTAL) BY MOUTH DAILY AS NEEDED FOR EDEMA. 30 tablet 1  . hydrocortisone cream 1 % Apply 1 application topically 2 (two) times daily as needed for itching.     . INCRUSE ELLIPTA 62.5 MCG/INH AEPB INHALE 1 PUFF BY MOUTH EVERY DAY 30 each 3  . ketoconazole (NIZORAL) 2 % cream Apply topically daily.    Marland Kitchen levothyroxine (SYNTHROID) 50 MCG tablet TAKE 1 TABLET EVERY DAY IN THE MORNING 90 tablet 3  . metoprolol tartrate (LOPRESSOR) 25 MG tablet TAKE 1 TABLET BY MOUTH TWICE A DAY 180 tablet 1  . metroNIDAZOLE (METROCREAM) 0.75 % cream Apply 1 application topically 2 (two) times daily.    . montelukast (SINGULAIR) 10 MG tablet Take 1 tablet (10 mg total) by mouth at bedtime. 90 tablet 3  . MYRBETRIQ 25 MG TB24 tablet Take 25 mg by mouth daily.    . Olopatadine HCl (PATADAY) 0.2 % SOLN Place 1 drop into both eyes daily as needed. 2.5 mL 5  . ondansetron (ZOFRAN) 4 MG tablet Take 1 tablet (4 mg total) by mouth 2 (two) times daily as needed for nausea or vomiting. 15 tablet 0  . rizatriptan (MAXALT-MLT) 10 MG disintegrating tablet TAKE 1 TABLET EVERY DAY AS NEEDED FOR  MIGRAINE, MAY REPEAT IN 2 HRS IF NEEDED. MAX 2 DOSES/WK 9 tablet 2  . rosuvastatin (CRESTOR) 20 MG tablet TAKE 1 TABLET BY MOUTH EVERY DAY 90 tablet 2  . sertraline (ZOLOFT) 100 MG tablet Take 200 mg by mouth at bedtime.     . sodium chloride (OCEAN) 0.65 % SOLN nasal spray Place 1 spray into both nostrils as needed for congestion.    . trihexyphenidyl (ARTANE) 2 MG tablet Take 2 mg by mouth daily.    Marland Kitchen zinc gluconate 50 MG tablet Take 50 mg by mouth daily.    Marland Kitchen zolpidem (AMBIEN) 10 MG tablet Take 10 mg by mouth at bedtime.    Marland Kitchen ELMIRON 100 MG capsule Take 100 mg by mouth 2 (two) times daily.  (Patient not taking: Reported on 07/27/2019)  1   Current Facility-Administered Medications on File Prior to Visit  Medication Dose Route Frequency Provider Last Rate Last Admin  . predniSONE (DELTASONE) tablet 10 mg  10 mg Oral Q breakfast Bobbitt, Sedalia Muta, MD        Past  Medical History:  Diagnosis Date  . Allergic rhinitis   . Anxiety   . Benign essential tremor    hands  . Bipolar 1 disorder, mixed, moderate ([place])   . Claustrophobia   . Depression   . Eczema   . Frequency of urination   . History of frequent urinary tract infections   . History of gastroesophageal reflux (GERD)    05-25-2017  per pt no issues since hiatal hernia repair 01/ 2018  . History of hiatal hernia   . History of melanoma excision    early 2000s-- BACK  . History of panic attacks   . 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 Hypothyroidism   . Intermittent palpitations    cardiology--  dr Martinique  . Interstitial cystitis   . Limited jaw range of motion    s/p  bilateral TMJ surgery,  age 89s  . Migraines   . Moderate persistent asthma    pulmologist-- dr Halford Chessman  . OA (osteoarthritis)    both knees  . OSA on CPAP    per last sleep study 09/ 2017  mild OSA, AHI13/hr  . PONV (postoperative nausea and vomiting)   . PVC (premature ventricular contraction)   . Rosacea   . Sensation of  pressure in bladder area    per pt intermittant  . TMJ arthralgia   . Urticaria   . Wears glasses   . White coat syndrome without hypertension    05-25-2017  per pt hx hypertension yrs ago, no issues after quitting stressful job    Past Surgical History:  Procedure Laterality Date  . [place] STUDY N/A 09/22/2015   Procedure: [place] STUDY;  Surgeon: Doran Stabler, MD;  Location: [facility];  Service: Gastroenterology;  Laterality: N/A;  . ADENOIDECTOMY    . ANAL FISSURE REPAIR  age 12  (31)  . BREAST EXCISIONAL BIOPSY Right 04-16-2003  dr p. young  [place]   benign  . BUNIONECTOMY Right early 2000s  . CARPAL TUNNEL RELEASE Left age 30 (36)  . CHOLECYSTECTOMY OPEN  age 59  . CYSTO WITH HYDRODISTENSION N/A 06/02/2017   Procedure: CYSTOSCOPY/HYDRODISTENSION OF BLADDER;  Surgeon: Irine Seal, MD;  Location: [facility];  Service: Urology;  Laterality: N/A;  . CYSTO/ HYDRODISTENTION/  INSTILLATION THERAPY  1990s  . DX LAPAROSCOPY/  DX HYSTEROSCOPY/  D & C  08-07-2007   dr dove  [facility]  . ENDOMETRIAL ABLATION W/ NOVASURE  10-21-2008   dr Harolyn Rutherford  [facility]  . ESOPHAGEAL MANOMETRY N/A 09/22/2015   Procedure: ESOPHAGEAL MANOMETRY (EM);  Surgeon: Doran Stabler, MD;  Location: [facility];  Service: Gastroenterology;  Laterality: N/A;  with impedence   . FINGER ARTHROPLASTY Bilateral    left little finger 2016:  right middle finger 06/ 2018  . KNEE ARTHROSCOPY Bilateral x3 left ;  x3  right-- last one age 23 (53)  . NASAL SEPTUM SURGERY  age 72s  . ROBOT ASSISTED REDUCTION PARAESOPHAGEAL HIATAL HERNIA/ TYPE 2 MEDIASTINAL DISSECTION/ PRIMARY HIATAL HERNIA REPAIR/ ANTERIOR & POSTERIOR GASTROPEXY/ NISSEN FUNDOPLICATION  [id number]   DR GROSS  [facility]  . SINOSCOPY    . TEMPOROMANDIBULAR JOINT SURGERY Bilateral x3  -- age 69s   per pt used graft  . TONSILLECTOMY    . TOTAL KNEE ARTHROPLASTY Left 12/26/2017   Procedure: LEFT TOTAL KNEE ARTHROPLASTY;  Surgeon: Gaynelle Arabian,  MD;  Location: [facility];  Service: Orthopedics;  Laterality: Left;  56min  . TRANSTHORACIC ECHOCARDIOGRAM  12-06-2017   dr Martinique   ef 55-60%, grade 1 diastoilc dysfunction, trivial MR  . TRIGGER FINGER RELEASE Bilateral last one 2017   several release's bilaterally  . ULNAR NERVE TRANSPOSITION Bilateral age 53 (1980)    Allergies  Allergen Reactions  . Emetrol Itching  . Indomethacin Other (See Comments)    Muscle spasms Causes muscle spasms in neck  . Iodinated Diagnostic Agents Itching    Flushed and Fever, itch all over  . Amlodipine Swelling    Peripheral edema  . Carbamazepine Other (See Comments)    Easy and unusual bleeding   . Pseudoephedrine Other (See Comments)    I fly off the walls  CANNOT SLEEP  . Risperidone And Related Other (See Comments)    Stomach upset, insomnia, drooling, tremors, "jerks," sensitivity to touch.   . Bupivacaine Hives  . Pentazocine Other (See Comments)    Unknown reaction MADE ME "LACTATE"  . Propoxyphene Itching and Nausea And Vomiting    darvocet  . Sulfa Antibiotics Other (See Comments)    Unknown reaction  . Tramadol Other (See Comments)    Patient can not remember  . Aspirin Other (See Comments)    upset stomach, ringing in the ears  . Codeine Itching and Other (See Comments)    Anything related to codeine  . Etodolac Rash and Other (See Comments)  . Propranolol Nausea Only and Other (See Comments)    Family History  Problem Relation Age of Onset  . Hypertension Mother        deceased from MVA complications  . Thyroid disease Mother   . Allergic rhinitis Mother   . Testicular cancer Father   . Allergic rhinitis Father   . Colon polyps Sister   . Coronary artery disease Other   . Healthy Brother   . Healthy Sister     Social History   Socioeconomic History  . Marital status: Divorced    Spouse name: Not on file  . Number of children: 0  . Years of education: Not on file  . Highest education level: Bachelor's  degree (e.g., BA, AB, BS)  Occupational History  . Not on file  Tobacco Use  . Smoking status: Never Smoker  . Smokeless tobacco: Never Used  Vaping Use  . Vaping Use: Never used  Substance and Sexual Activity  . Alcohol use: Yes    Alcohol/week: 0.0 standard drinks    Comment: once every 3-4 months  . Drug use: No  . Sexual activity: Not on file  Other Topics Concern  . Not on file  Social History Narrative  . Not on file   Social Determinants of Health   Financial Resource Strain:   . Difficulty of Paying Living Expenses:   Food Insecurity:   . Worried About Charity fundraiser in the Last Year:   . Arboriculturist in the Last Year:   Transportation Needs:   . Film/video editor (Medical):   Marland Kitchen Lack of Transportation (Non-Medical):   Physical Activity:   . Days of Exercise per Week:   . Minutes of Exercise per Session:   Stress:   . Feeling of Stress :   Social Connections:   . Frequency of Communication with Friends and Family:   . Frequency of Social Gatherings with Friends and Family:   . Attends Religious Services:   . Active Member of Clubs or Organizations:   . Attends Club  or Organization Meetings:   Marland Kitchen Marital Status:      Vitals:   09/11/19 1107  BP: 120/78  Pulse: 82  Resp: 16  Temp: (!) 97.4 F (36.3 C)  SpO2: 96%   Body mass index is 35.6 kg/m.  Wt Readings from Last 3 Encounters:  09/11/19 176 lb 4 oz (79.9 kg)  06/25/19 176 lb (79.8 kg)  10/31/18 138 lb 2 oz (62.7 kg)   Physical Exam Vitals and nursing note reviewed.  Constitutional:      General: She is not in acute distress.    Appearance: She is well-developed.  HENT:     Head: Normocephalic and atraumatic.     Right Ear: Hearing, tympanic membrane, ear canal and external ear normal.     Left Ear: Hearing, tympanic membrane, ear canal and external ear normal.     Mouth/Throat:     Pharynx: Uvula midline.  Eyes:     Conjunctiva/sclera: Conjunctivae normal.     Pupils:  Pupils are equal, round, and reactive to light.  Neck:     Thyroid: No thyromegaly.     Trachea: No tracheal deviation.  Cardiovascular:     Rate and Rhythm: Normal rate and regular rhythm.     Pulses:          Dorsalis pedis pulses are 2+ on the right side and 2+ on the left side.     Heart sounds: No murmur heard.   Pulmonary:     Effort: Pulmonary effort is normal. No respiratory distress.     Breath sounds: Normal breath sounds.  Abdominal:     Palpations: Abdomen is soft. There is no hepatomegaly or mass.     Tenderness: There is no abdominal tenderness.  Genitourinary:    Comments: Breast: No masses, nodularity bilateral,upper outer quadrants mainly. Left axillary tenderness. No skin changes or nipple discharge. Musculoskeletal:     Right wrist: Tenderness (Mild with ROM) present. No deformity. Decreased range of motion (Baseline).     Comments: No signs of synovitis appreciated. Splint left thumb.  Lymphadenopathy:     Cervical: No cervical adenopathy.     Upper Body:     Right upper body: No supraclavicular adenopathy.     Left upper body: No supraclavicular adenopathy.  Skin:    General: Skin is warm.     Findings: No erythema or rash.  Neurological:     Mental Status: She is alert and oriented to person, place, and time.     Cranial Nerves: No cranial nerve deficit.     Coordination: Coordination normal.     Gait: Gait normal.     Deep Tendon Reflexes:     Reflex Scores:      Bicep reflexes are 2+ on the right side and 2+ on the left side.      Patellar reflexes are 2+ on the right side and 2+ on the left side. Psychiatric:        Speech: Speech normal.     Comments: Well groomed, good eye contact.   ASSESSMENT AND PLAN:  [name] [id number] was here today annual physical examination.  Orders Placed This Encounter  Procedures  . MM DIAG BREAST TOMO BILATERAL  . Hepatic function panel  . Lipid panel  . Hemoglobin A1c        Lab Results   Component Value Date   HGBA1C 6.4 (H) 09/11/2019        Lab Results  Component Value Date   ALT  17 09/11/2019   AST 22 09/11/2019   ALKPHOS 78 05/23/2019   BILITOT 0.4 09/11/2019         Lab Results  Component Value Date   CHOL 174 09/11/2019   HDL 78 09/11/2019   LDLCALC 79 09/11/2019   LDLDIRECT 143.0 09/14/2016   TRIG 89 09/11/2019   CHOLHDL 2.2 09/11/2019   Routine general medical examination at a health care facility We discussed the importance of regular physical activity and healthy diet for prevention of chronic illness and/or complications. Preventive guidelines reviewed. Vaccination up to date.  Ca++ and vit D supplementation to continue. Next CPE in a year.  Essential hypertension BP adequately controlled. Continue Metoprolol 25 mg bid, which has also helped with sinus tach. Low salt diet to continue.  Mixed hyperlipidemia Continue Crestor 20 mg daily and low fat diet. Further recommendations according to FLP results.  Breast tenderness in female On examination, no masses. Dx mammogram will be arranged.  Prediabetes Healthy life style for primary prevention. We could consider preventive treatment with Metformin.  Fall, initial encounter Fall precautions discussed. For now I do not think imaging is needed.   Return in 6 months (on 03/13/2020) for awv after 10/31/19.   Thomasena Vandenheuvel G. Martinique, MD  [facility]. [place] office.  If we have ordered labs or studies at this visit, it can take up to 1-2 weeks for results and processing. IF results require follow up or explanation, we will call you with instructions. Clinically stable results will be released to your [facility]. If you have not heard from [place] or cannot find your results in [facility] in 2 weeks please contact our office at [phone].  If you are not yet signed up for [facility], please consider signing up.  Today you have you routine preventive visit. A few things to  remember from today's visit:   Routine general medical examination at a health care facility  Essential hypertension  Mixed hyperlipidemia - Plan: Hepatic function panel, Lipid panel  Breast tenderness in female - Plan: Mammogram Digital Diagnostic Bilateral  Prediabetes - Plan: Hemoglobin A1c  Fall, initial encounter  If you need refills please call your pharmacy. Do not use My Chart to request refills or for acute issues that need immediate attention.    Please be sure medication list is accurate. If a new problem present, please set up appointment sooner than planned today.  At least 150 minutes of moderate exercise per week, daily brisk walking for 15-30 min is a good exercise option. Healthy diet low in saturated (animal) fats and sweets and consisting of fresh fruits and vegetables, lean meats such as fish and white chicken and whole grains.  These are some of recommendations for screening depending of age and risk factors:  - Vaccines:  Tdap vaccine every 10 years.  Shingles vaccine recommended at age 64, could be given after 63 years of age but not sure about insurance coverage.   Pneumonia vaccines: Pneumovax at 63. Sometimes Pneumovax is giving earlier if history of smoking, lung disease,diabetes,kidney disease among some.  Screening for diabetes at age 88 and every 3 years.  Cervical cancer prevention:  Pap smear starts at 63 years of age and continues periodically until 63 years old in low risk women. Pap smear every 3 years between 65 and 55 years old. Pap smear every 3-5 years between women 65 and older if pap smear negative and HPV screening negative.   -Breast cancer: Mammogram: There is disagreement between experts about when  to start screening in low risk asymptomatic female but recent recommendations are to start screening at 49 and not later than 63 years old , every 1-2 years and after 63 yo q 2 years. Screening is recommended until 63 years old but  some women can continue screening depending of healthy issues.  Colon cancer screening: Has been recently changed to 63 yo. Insurance may not cover until you are 63 years old. Screening is recommended until 63 years old.  Cholesterol disorder screening at age 80 and every 3 years.N/A  Also recommended:  1. Dental visit- Brush and floss your teeth twice daily; visit your dentist twice a year. 2. Eye doctor- Get an eye exam at least every 2 years. 3. Helmet use- Always wear a helmet when riding a bicycle, motorcycle, rollerblading or skateboarding. 4. Safe sex- If you may be exposed to sexually transmitted infections, use a condom. 5. Seat belts- Seat belts can save your live; always wear one. 6. Smoke/Carbon Monoxide detectors- These detectors need to be installed on the appropriate level of your home. Replace batteries at least once a year. 7. Skin cancer- When out in the sun please cover up and use sunscreen 15 SPF or higher. 8. Violence- If anyone is threatening or hurting you, please tell your healthcare provider.  9. Drink alcohol in moderation- Limit alcohol intake to one drink or less per day. Never drink and drive. 10. Calcium supplementation 1000 to 1200 mg daily, ideally through your diet.  Vitamin D supplementation 800 units daily.

## 2019-09-11 NOTE — Patient Instructions (Addendum)
If we have ordered labs or studies at this visit, it can take up to 1-2 weeks for results and processing. IF results require follow up or explanation, we will call you with instructions. Clinically stable results will be released to your [facility]. If you have not heard from [place] or cannot find your results in [facility] in 2 weeks please contact our office at [phone].  If you are not yet signed up for [facility], please consider signing up.  Today you have you routine preventive visit. A few things to remember from today's visit:   Routine general medical examination at a health care facility  Essential hypertension  Mixed hyperlipidemia - Plan: Hepatic function panel, Lipid panel  Breast tenderness in female - Plan: Mammogram Digital Diagnostic Bilateral  Prediabetes - Plan: Hemoglobin A1c  Fall, initial encounter  If you need refills please call your pharmacy. Do not use My Chart to request refills or for acute issues that need immediate attention.    Please be sure medication list is accurate. If a new problem present, please set up appointment sooner than planned today.  At least 150 minutes of moderate exercise per week, daily brisk walking for 15-30 min is a good exercise option. Healthy diet low in saturated (animal) fats and sweets and consisting of fresh fruits and vegetables, lean meats such as fish and white chicken and whole grains.  These are some of recommendations for screening depending of age and risk factors:  - Vaccines:  Tdap vaccine every 10 years.  Shingles vaccine recommended at age 85, could be given after 63 years of age but not sure about insurance coverage.   Pneumonia vaccines: Pneumovax at 59. Sometimes Pneumovax is giving earlier if history of smoking, lung disease,diabetes,kidney disease among some.  Screening for diabetes at age 94 and every 3 years.  Cervical cancer prevention:  Pap smear starts at 62 years of age and continues periodically until  63 years old in low risk women. Pap smear every 3 years between 5 and 44 years old. Pap smear every 3-5 years between women 18 and older if pap smear negative and HPV screening negative.   -Breast cancer: Mammogram: There is disagreement between experts about when to start screening in low risk asymptomatic female but recent recommendations are to start screening at 18 and not later than 63 years old , every 1-2 years and after 63 yo q 2 years. Screening is recommended until 63 years old but some women can continue screening depending of healthy issues.  Colon cancer screening: Has been recently changed to 63 yo. Insurance may not cover until you are 63 years old. Screening is recommended until 63 years old.  Cholesterol disorder screening at age 16 and every 3 years.N/A  Also recommended:  1. Dental visit- Brush and floss your teeth twice daily; visit your dentist twice a year. 2. Eye doctor- Get an eye exam at least every 2 years. 3. Helmet use- Always wear a helmet when riding a bicycle, motorcycle, rollerblading or skateboarding. 4. Safe sex- If you may be exposed to sexually transmitted infections, use a condom. 5. Seat belts- Seat belts can save your live; always wear one. 6. Smoke/Carbon Monoxide detectors- These detectors need to be installed on the appropriate level of your home. Replace batteries at least once a year. 7. Skin cancer- When out in the sun please cover up and use sunscreen 15 SPF or higher. 8. Violence- If anyone is threatening or hurting you, please tell your healthcare provider.  9. Drink alcohol in moderation- Limit alcohol intake to one drink or less per day. Never drink and drive. 10. Calcium supplementation 1000 to 1200 mg daily, ideally through your diet.  Vitamin D supplementation 800 units daily.

## 2019-09-12 LAB — HEPATIC FUNCTION PANEL
AG Ratio: 1.8 (calc) (ref 1.0–2.5)
ALT: 17 U/L (ref 6–29)
AST: 22 U/L (ref 10–35)
Albumin: 4.4 g/dL (ref 3.6–5.1)
Alkaline phosphatase (APISO): 85 U/L (ref 37–153)
Bilirubin, Direct: 0.1 mg/dL (ref 0.0–0.2)
Globulin: 2.4 g/dL (calc) (ref 1.9–3.7)
Indirect Bilirubin: 0.3 mg/dL (calc) (ref 0.2–1.2)
Total Bilirubin: 0.4 mg/dL (ref 0.2–1.2)
Total Protein: 6.8 g/dL (ref 6.1–8.1)

## 2019-09-12 LAB — LIPID PANEL
Cholesterol: 174 mg/dL (ref <200)
HDL: 78 mg/dL (ref >=50)
LDL Cholesterol (Calc): 79 mg/dL (calc)
Non-HDL Cholesterol (Calc): 96 mg/dL (calc) (ref <130)
Total CHOL/HDL Ratio: 2.2 (calc) (ref <5.0)
Triglycerides: 89 mg/dL (ref <150)

## 2019-09-12 LAB — HEMOGLOBIN A1C
Hgb A1c MFr Bld: 6.4 % of total Hgb — ABNORMAL HIGH (ref <5.7)
Mean Plasma Glucose: 137 (calc)
eAG (mmol/L): 7.6 (calc)

## 2019-09-13 ENCOUNTER — Other Ambulatory Visit: Payer: Self-pay | Admitting: Family Medicine

## 2019-09-13 ENCOUNTER — Encounter: Payer: Self-pay | Admitting: Family Medicine

## 2019-09-13 DIAGNOSIS — N644 Mastodynia: Secondary | ICD-10-CM

## 2019-09-15 ENCOUNTER — Other Ambulatory Visit: Payer: Self-pay | Admitting: Family Medicine

## 2019-09-20 ENCOUNTER — Other Ambulatory Visit: Payer: Self-pay | Admitting: Family Medicine

## 2019-09-25 DIAGNOSIS — R69 Illness, unspecified: Secondary | ICD-10-CM | POA: Diagnosis not present

## 2019-09-25 DIAGNOSIS — F422 Mixed obsessional thoughts and acts: Secondary | ICD-10-CM | POA: Diagnosis not present

## 2019-09-25 DIAGNOSIS — F423 Hoarding disorder: Secondary | ICD-10-CM | POA: Diagnosis not present

## 2019-09-27 DIAGNOSIS — M1812 Unilateral primary osteoarthritis of first carpometacarpal joint, left hand: Secondary | ICD-10-CM | POA: Diagnosis not present

## 2019-09-27 DIAGNOSIS — M654 Radial styloid tenosynovitis [de Quervain]: Secondary | ICD-10-CM | POA: Diagnosis not present

## 2019-10-06 DIAGNOSIS — G4733 Obstructive sleep apnea (adult) (pediatric): Secondary | ICD-10-CM | POA: Diagnosis not present

## 2019-10-09 ENCOUNTER — Telehealth: Payer: Self-pay | Admitting: Family Medicine

## 2019-10-09 DIAGNOSIS — M19011 Primary osteoarthritis, right shoulder: Secondary | ICD-10-CM | POA: Diagnosis not present

## 2019-10-09 DIAGNOSIS — M1812 Unilateral primary osteoarthritis of first carpometacarpal joint, left hand: Secondary | ICD-10-CM | POA: Diagnosis not present

## 2019-10-09 DIAGNOSIS — M25642 Stiffness of left hand, not elsewhere classified: Secondary | ICD-10-CM | POA: Diagnosis not present

## 2019-10-09 DIAGNOSIS — M180 Bilateral primary osteoarthritis of first carpometacarpal joints: Secondary | ICD-10-CM | POA: Diagnosis not present

## 2019-10-09 DIAGNOSIS — M654 Radial styloid tenosynovitis [de Quervain]: Secondary | ICD-10-CM | POA: Diagnosis not present

## 2019-10-09 DIAGNOSIS — Z4789 Encounter for other orthopedic aftercare: Secondary | ICD-10-CM | POA: Diagnosis not present

## 2019-10-09 NOTE — Progress Notes (Signed)
  Chronic Care Management   Outreach Note  10/09/2019 Name: [name] MRN: [id number] DOB: 13-Aug-1955  Referred by: [place], Betty G, MD Reason for referral : No chief complaint on file.   An unsuccessful telephone outreach was attempted today. The patient was referred to the pharmacist for assistance with care management and care coordination.   Follow Up Plan:   Carley Perdue [employer] Scheduler

## 2019-10-11 DIAGNOSIS — F422 Mixed obsessional thoughts and acts: Secondary | ICD-10-CM | POA: Diagnosis not present

## 2019-10-11 DIAGNOSIS — R69 Illness, unspecified: Secondary | ICD-10-CM | POA: Diagnosis not present

## 2019-10-11 DIAGNOSIS — F423 Hoarding disorder: Secondary | ICD-10-CM | POA: Diagnosis not present

## 2019-10-14 ENCOUNTER — Other Ambulatory Visit: Payer: Self-pay | Admitting: Family Medicine

## 2019-10-14 DIAGNOSIS — G43809 Other migraine, not intractable, without status migrainosus: Secondary | ICD-10-CM

## 2019-10-16 ENCOUNTER — Other Ambulatory Visit: Payer: Self-pay | Admitting: Allergy and Immunology

## 2019-10-18 ENCOUNTER — Ambulatory Visit
Admission: RE | Admit: 2019-10-18 | Discharge: 2019-10-18 | Disposition: A | Discharge status: Home / Self Care | Payer: Medicare HMO | Source: Ambulatory Visit | Attending: Family Medicine | Admitting: Family Medicine

## 2019-10-18 ENCOUNTER — Other Ambulatory Visit: Payer: Self-pay

## 2019-10-18 DIAGNOSIS — N644 Mastodynia: Secondary | ICD-10-CM

## 2019-10-18 DIAGNOSIS — N6489 Other specified disorders of breast: Secondary | ICD-10-CM | POA: Diagnosis not present

## 2019-10-18 DIAGNOSIS — R922 Inconclusive mammogram: Secondary | ICD-10-CM | POA: Diagnosis not present

## 2019-10-19 DIAGNOSIS — K219 Gastro-esophageal reflux disease without esophagitis: Secondary | ICD-10-CM | POA: Diagnosis not present

## 2019-10-19 DIAGNOSIS — R490 Dysphonia: Secondary | ICD-10-CM | POA: Diagnosis not present

## 2019-10-23 DIAGNOSIS — M79642 Pain in left hand: Secondary | ICD-10-CM | POA: Diagnosis not present

## 2019-10-23 DIAGNOSIS — M25642 Stiffness of left hand, not elsewhere classified: Secondary | ICD-10-CM | POA: Diagnosis not present

## 2019-10-24 ENCOUNTER — Telehealth: Payer: Self-pay | Admitting: Family Medicine

## 2019-10-24 NOTE — Progress Notes (Signed)
  Chronic Care Management   Outreach Note  10/24/2019 Name: [name] MRN: [id number] DOB: [birth year]  Referred by: Martinique, Betty G, MD Reason for referral : No chief complaint on file.   A second unsuccessful telephone outreach was attempted today. The patient was referred to pharmacist for assistance with care management and care coordination.  Follow Up Plan:   Carley Perdue [employer] Scheduler

## 2019-10-30 ENCOUNTER — Other Ambulatory Visit: Payer: Self-pay

## 2019-10-30 ENCOUNTER — Other Ambulatory Visit: Payer: Self-pay | Admitting: Allergy and Immunology

## 2019-10-30 MED ORDER — FLOVENT HFA 220 MCG/ACT IN AERO: INHALATION_SPRAY | RESPIRATORY_TRACT | 3 refills | Status: DC

## 2019-10-31 ENCOUNTER — Telehealth: Payer: Self-pay | Admitting: Family Medicine

## 2019-10-31 NOTE — Telephone Encounter (Signed)
Patient is requesting a TOC from [place] to [place] Please advise?

## 2019-10-31 NOTE — Telephone Encounter (Signed)
[  I am surprised about her decision to change.] Appt with other provider that is taking new pts can be arranged. Thanks, BJ

## 2019-10-31 NOTE — Telephone Encounter (Signed)
I defer at this time. I am very booked with new patients.

## 2019-11-01 ENCOUNTER — Ambulatory Visit: Payer: Medicare HMO

## 2019-11-02 DIAGNOSIS — L304 Erythema intertrigo: Secondary | ICD-10-CM | POA: Diagnosis not present

## 2019-11-05 DIAGNOSIS — M25642 Stiffness of left hand, not elsewhere classified: Secondary | ICD-10-CM | POA: Diagnosis not present

## 2019-11-06 DIAGNOSIS — M19011 Primary osteoarthritis, right shoulder: Secondary | ICD-10-CM | POA: Diagnosis not present

## 2019-11-06 DIAGNOSIS — M654 Radial styloid tenosynovitis [de Quervain]: Secondary | ICD-10-CM | POA: Diagnosis not present

## 2019-11-06 DIAGNOSIS — M180 Bilateral primary osteoarthritis of first carpometacarpal joints: Secondary | ICD-10-CM | POA: Diagnosis not present

## 2019-11-06 DIAGNOSIS — M1812 Unilateral primary osteoarthritis of first carpometacarpal joint, left hand: Secondary | ICD-10-CM | POA: Diagnosis not present

## 2019-11-06 DIAGNOSIS — Z4789 Encounter for other orthopedic aftercare: Secondary | ICD-10-CM | POA: Diagnosis not present

## 2019-11-06 DIAGNOSIS — G4733 Obstructive sleep apnea (adult) (pediatric): Secondary | ICD-10-CM | POA: Diagnosis not present

## 2019-11-07 ENCOUNTER — Other Ambulatory Visit: Payer: Self-pay | Admitting: Allergy and Immunology

## 2019-11-08 ENCOUNTER — Ambulatory Visit (INDEPENDENT_AMBULATORY_CARE_PROVIDER_SITE_OTHER): Payer: Medicare HMO

## 2019-11-08 ENCOUNTER — Other Ambulatory Visit: Payer: Self-pay

## 2019-11-08 VITALS — BP 118/70 | HR 78 | Temp 98.4°F | Ht 59.0 in | Wt 178.1 lb

## 2019-11-08 DIAGNOSIS — Z23 Encounter for immunization: Secondary | ICD-10-CM | POA: Diagnosis not present

## 2019-11-08 DIAGNOSIS — Z0000 Encounter for general adult medical examination without abnormal findings: Secondary | ICD-10-CM

## 2019-11-08 NOTE — Patient Instructions (Signed)
[name] , Thank you for taking time to come for your Medicare Wellness Visit. I appreciate your ongoing commitment to your health goals. Please review the following plan we discussed and let me know if I can assist you in the future.   Screening recommendations/referrals: Colonoscopy: Up to date, next due 04/02/2024 Mammogram: Up to date, next due 10/17/2020 Bone Density: Currently due, orders placed this visit  Recommended yearly ophthalmology/optometry visit for glaucoma screening and checkup Recommended yearly dental visit for hygiene and checkup  Vaccinations: Influenza vaccine: Completed at today's visit  Pneumococcal vaccine: up to date, next due at age 1 Tdap vaccine: Up to date, next due 06/26/2023 Shingles vaccine: Completed series    Advanced directives: Copies of Advanced Directives given to patient this visit  Conditions/risks identified: None   Next appointment: 12/18/2019 @ 11:00 am with Pharmacist   Preventive Care 40-64 Years, Female Preventive care refers to lifestyle choices and visits with your health care provider that can promote health and wellness. What does preventive care include?  A yearly physical exam. This is also called an annual well check.  Dental exams once or twice a year.  Routine eye exams. Ask your health care provider how often you should have your eyes checked.  Personal lifestyle choices, including:  Daily care of your teeth and gums.  Regular physical activity.  Eating a healthy diet.  Avoiding tobacco and drug use.  Limiting alcohol use.  Practicing safe sex.  Taking low-dose aspirin daily starting at age 21.  Taking vitamin and mineral supplements as recommended by your health care provider. What happens during an annual well check? The services and screenings done by your health care provider during your annual well check will depend on your age, overall health, lifestyle risk factors, and family history of  disease. Counseling  Your health care provider may ask you questions about your:  Alcohol use.  Tobacco use.  Drug use.  Emotional well-being.  Home and relationship well-being.  Sexual activity.  Eating habits.  Work and work Statistician.  Method of birth control.  Menstrual cycle.  Pregnancy history. Screening  You may have the following tests or measurements:  Height, weight, and BMI.  Blood pressure.  Lipid and cholesterol levels. These may be checked every 5 years, or more frequently if you are over 57 years old.  Skin check.  Lung cancer screening. You may have this screening every year starting at age 80 if you have a 30-pack-year history of smoking and currently smoke or have quit within the past 15 years.  Fecal occult blood test (FOBT) of the stool. You may have this test every year starting at age 67.  Flexible sigmoidoscopy or colonoscopy. You may have a sigmoidoscopy every 5 years or a colonoscopy every 10 years starting at age 37.  Hepatitis C blood test.  Hepatitis B blood test.  Sexually transmitted disease (STD) testing.  Diabetes screening. This is done by checking your blood sugar (glucose) after you have not eaten for a while (fasting). You may have this done every 1-3 years.  Mammogram. This may be done every 1-2 years. Talk to your health care provider about when you should start having regular mammograms. This may depend on whether you have a family history of breast cancer.  BRCA-related cancer screening. This may be done if you have a family history of breast, ovarian, tubal, or peritoneal cancers.  Pelvic exam and Pap test. This may be done every 3 years starting at age  21. Starting at age 71, this may be done every 5 years if you have a Pap test in combination with an HPV test.  Bone density scan. This is done to screen for osteoporosis. You may have this scan if you are at high risk for osteoporosis. Discuss your test results,  treatment options, and if necessary, the need for more tests with your health care provider. Vaccines  Your health care provider may recommend certain vaccines, such as:  Influenza vaccine. This is recommended every year.  Tetanus, diphtheria, and acellular pertussis (Tdap, Td) vaccine. You may need a Td booster every 10 years.  Zoster vaccine. You may need this after age 74.  Pneumococcal 13-valent conjugate (PCV13) vaccine. You may need this if you have certain conditions and were not previously vaccinated.  Pneumococcal polysaccharide (PPSV23) vaccine. You may need one or two doses if you smoke cigarettes or if you have certain conditions. Talk to your health care provider about which screenings and vaccines you need and how often you need them. This information is not intended to replace advice given to you by your health care provider. Make sure you discuss any questions you have with your health care provider. Document Released: 02/21/2015 Document Revised: 10/15/2015 Document Reviewed: 11/26/2014 Elsevier Interactive Patient Education  2017 [place] Prevention in the Home Falls can cause injuries. They can happen to people of all ages. There are many things you can do to make your home safe and to help prevent falls. What can I do on the outside of my home?  Regularly fix the edges of walkways and driveways and fix any cracks.  Remove anything that might make you trip as you walk through a door, such as a raised step or threshold.  Trim any bushes or trees on the path to your home.  Use bright outdoor lighting.  Clear any walking paths of anything that might make someone trip, such as rocks or tools.  Regularly check to see if handrails are loose or broken. Make sure that both sides of any steps have handrails.  Any raised decks and porches should have guardrails on the edges.  Have any leaves, snow, or ice cleared regularly.  Use sand or salt on walking  paths during winter.  Clean up any spills in your garage right away. This includes oil or grease spills. What can I do in the bathroom?  Use night lights.  Install grab bars by the toilet and in the tub and shower. Do not use towel bars as grab bars.  Use non-skid mats or decals in the tub or shower.  If you need to sit down in the shower, use a plastic, non-slip stool.  Keep the floor dry. Clean up any water that spills on the floor as soon as it happens.  Remove soap buildup in the tub or shower regularly.  Attach bath mats securely with double-sided non-slip rug tape.  Do not have throw rugs and other things on the floor that can make you trip. What can I do in the bedroom?  Use night lights.  Make sure that you have a light by your bed that is easy to reach.  Do not use any sheets or blankets that are too big for your bed. They should not hang down onto the floor.  Have a firm chair that has side arms. You can use this for support while you get dressed.  Do not have throw rugs and other things on  the floor that can make you trip. What can I do in the kitchen?  Clean up any spills right away.  Avoid walking on wet floors.  Keep items that you use a lot in easy-to-reach places.  If you need to reach something above you, use a strong step stool that has a grab bar.  Keep electrical cords out of the way.  Do not use floor polish or wax that makes floors slippery. If you must use wax, use non-skid floor wax.  Do not have throw rugs and other things on the floor that can make you trip. What can I do with my stairs?  Do not leave any items on the stairs.  Make sure that there are handrails on both sides of the stairs and use them. Fix handrails that are broken or loose. Make sure that handrails are as long as the stairways.  Check any carpeting to make sure that it is firmly attached to the stairs. Fix any carpet that is loose or worn.  Avoid having throw rugs at  the top or bottom of the stairs. If you do have throw rugs, attach them to the floor with carpet tape.  Make sure that you have a light switch at the top of the stairs and the bottom of the stairs. If you do not have them, ask someone to add them for you. What else can I do to help prevent falls?  Wear shoes that:  Do not have high heels.  Have rubber bottoms.  Are comfortable and fit you well.  Are closed at the toe. Do not wear sandals.  If you use a stepladder:  Make sure that it is fully opened. Do not climb a closed stepladder.  Make sure that both sides of the stepladder are locked into place.  Ask someone to hold it for you, if possible.  Clearly mark and make sure that you can see:  Any grab bars or handrails.  First and last steps.  Where the edge of each step is.  Use tools that help you move around (mobility aids) if they are needed. These include:  Canes.  Walkers.  Scooters.  Crutches.  Turn on the lights when you go into a dark area. Replace any light bulbs as soon as they burn out.  Set up your furniture so you have a clear path. Avoid moving your furniture around.  If any of your floors are uneven, fix them.  If there are any pets around you, be aware of where they are.  Review your medicines with your doctor. Some medicines can make you feel dizzy. This can increase your chance of falling. Ask your doctor what other things that you can do to help prevent falls. This information is not intended to replace advice given to you by your health care provider. Make sure you discuss any questions you have with your health care provider. Document Released: 11/21/2008 Document Revised: 07/03/2015 Document Reviewed: 03/01/2014 Elsevier Interactive Patient Education  2017 [employer].

## 2019-11-08 NOTE — Addendum Note (Signed)
Addended by: Launa Grill on: 11/08/2019 03:43 PM   Modules accepted: Orders

## 2019-11-08 NOTE — Progress Notes (Signed)
Subjective:   [name] is a 64 y.o. female who presents for Medicare Annual (Subsequent) preventive examination.        Review of Systems    N/A Cardiac Risk Factors include: hypertension;dyslipidemia     Objective:    Today's Vitals   11/08/19 1319 11/08/19 1320  BP: 118/70   Pulse: 78   Temp: 98.4 F (36.9 C)   TempSrc: Oral   SpO2: 96%   Weight: 178 lb 2 oz (80.8 kg)   Height: 4\' 11"  (1.499 m)   PainSc:  2    Body mass index is 35.98 kg/m.  Advanced Directives 11/08/2019 08/24/2018 01/16/2018 01/16/2018 12/26/2017 12/21/2017 10/24/2017  Does Patient Have a Medical Advance Directive? No No No No No No No  Would patient like information on creating a medical advance directive? Yes (MAU/Ambulatory/Procedural Areas - Information given) No - Patient declined No - Patient declined - No - Patient declined - Yes (MAU/Ambulatory/Procedural Areas - Information given)  Pre-existing out of facility DNR order (yellow form or pink MOST form) - - - - - - -    Current Medications (verified) Outpatient Encounter Medications as of 11/08/2019  Medication Sig  . acetaminophen (TYLENOL) 500 MG tablet Take 500 mg by mouth every 6 (six) hours as needed.  Marland Kitchen albuterol (VENTOLIN HFA) 108 (90 Base) MCG/ACT inhaler INHALE 1-2 PUFFS INTO THE LUNGS DAILY AS NEEDED FOR WHEEZING OR SHORTNESS OF BREATH.  Marland Kitchen alendronate (FOSAMAX) 70 MG tablet TAKE 1 TABLET BY MOUTH EVERY 7 DAYS WITH FULL GLASS OF WATER ON EMPTY STOMACH  . ARIPiprazole (ABILIFY) 10 MG tablet Take 10 mg by mouth every morning.  . Ascorbic Acid (VITAMIN C CR) 500 MG CPCR Take by mouth.  . Azelastine HCl 0.15 % SOLN PLACE 1-2 SPRAYS INTO BOTH NOSTRILS 2 (TWO) TIMES DAILY.  . clonazePAM (KLONOPIN) 0.5 MG tablet 2 (two) times daily.   . [facility] D3 125 MCG (5000 UT) capsule TAKE 1 CAPSULE (5,000 UNITS TOTAL) BY MOUTH DAILY.  . famotidine (PEPCID) 20 MG tablet Take 20 mg by mouth at bedtime.  Marland Kitchen FLOVENT HFA 220 MCG/ACT inhaler TAKE 2  PUFFS BY MOUTH TWICE A DAY  . fluticasone (FLONASE) 50 MCG/ACT nasal spray PLACE 1 SPRAY INTO BOTH NOSTRILS 2 (TWO) TIMES DAILY AS NEEDED FOR ALLERGIES OR RHINITIS.  . furosemide (LASIX) 20 MG tablet TAKE 1 TABLET (20 MG TOTAL) BY MOUTH DAILY AS NEEDED FOR EDEMA.  . hydrocortisone 2.5 % ointment Apply topically 2 (two) times daily.  . INCRUSE ELLIPTA 62.5 MCG/INH AEPB INHALE 1 PUFF BY MOUTH EVERY DAY  . levothyroxine (SYNTHROID) 50 MCG tablet TAKE 1 TABLET EVERY DAY IN THE MORNING  . methocarbamol (ROBAXIN) 500 MG tablet Take 500 mg by mouth every 6 (six) hours as needed.  . metoprolol tartrate (LOPRESSOR) 25 MG tablet TAKE 1 TABLET BY MOUTH TWICE A DAY  . metroNIDAZOLE (METROCREAM) 0.75 % cream Apply 1 application topically 2 (two) times daily.  . miconazole (MICOTIN) 2 % cream Apply 1 application topically 2 (two) times daily.  . montelukast (SINGULAIR) 10 MG tablet TAKE 1 TABLET BY MOUTH EVERYDAY AT BEDTIME  . MYRBETRIQ 25 MG TB24 tablet Take 25 mg by mouth daily.  . Olopatadine HCl (PATADAY) 0.2 % SOLN Place 1 drop into both eyes daily as needed.  . ondansetron (ZOFRAN) 4 MG tablet Take 1 tablet (4 mg total) by mouth 2 (two) times daily as needed for nausea or vomiting.  Marland Kitchen oxyCODONE (OXY IR/ROXICODONE) 5 MG  immediate release tablet oxycodone 5 mg tablet  1 po q4-6 hrs prn pain  . rizatriptan (MAXALT-MLT) 10 MG disintegrating tablet TAKE 1 TABLET EVERY DAY AS NEEDED FOR MIGRAINE, MAY REPEAT IN 2 HRS IF NEEDED. MAX 2 DOSES/WK  . rosuvastatin (CRESTOR) 20 MG tablet TAKE 1 TABLET BY MOUTH EVERY DAY  . sertraline (ZOLOFT) 100 MG tablet Take 200 mg by mouth at bedtime.   . sodium chloride (OCEAN) 0.65 % SOLN nasal spray Place 1 spray into both nostrils as needed for congestion.  . trihexyphenidyl (ARTANE) 2 MG tablet Take 2 mg by mouth daily.  Marland Kitchen zinc gluconate 50 MG tablet Take 50 mg by mouth daily.  . Zinc Oxide 40 % PSTE Apply topically.  Marland Kitchen zolpidem (AMBIEN) 10 MG tablet Take 10 mg by mouth at  bedtime.  Marland Kitchen albuterol (PROVENTIL) (2.5 MG/3ML) 0.083% nebulizer solution TAKE 3 MLS BY NEBULIZATION EVERY 6 HOURS AS NEEDED FOR WHEEZING OR SHORTNESS OF BREATH. (Patient not taking: Reported on 11/08/2019)  . Carbinoxamine Maleate 4 MG TABS Take 1 tablet (4 mg total) by mouth every 8 (eight) hours as needed.  Marland Kitchen estradiol (ESTRACE) 0.1 MG/GM vaginal cream PLACE 1 APPLICATORFUL VAGINALLY ONCE A WEEK. (Patient not taking: Reported on 11/08/2019)  . hydrocortisone cream 1 % Apply 1 application topically 2 (two) times daily as needed for itching.  (Patient not taking: Reported on 11/08/2019)  . ketoconazole (NIZORAL) 2 % cream Apply topically daily. (Patient not taking: Reported on 11/08/2019)  . [DISCONTINUED] ELMIRON 100 MG capsule Take 100 mg by mouth 2 (two) times daily.  (Patient not taking: Reported on 07/27/2019)  . [DISCONTINUED] fluticasone (FLOVENT HFA) 220 MCG/ACT inhaler TAKE 2 PUFFS BY MOUTH TWICE A DAY   Facility-Administered Encounter Medications as of 11/08/2019  Medication  . predniSONE (DELTASONE) tablet 10 mg    Allergies (verified) Emetrol, Indomethacin, Iodinated diagnostic agents, Amlodipine, Carbamazepine, Pseudoephedrine, Risperidone and related, Bupivacaine, Pentazocine, Propoxyphene, Sulfa antibiotics, Tramadol, Aspirin, Codeine, Etodolac, and Propranolol   History: Past Medical History:  Diagnosis Date  . Allergic rhinitis   . Anxiety   . Benign essential tremor    hands  . Bipolar 1 disorder, mixed, moderate ([place])   . Claustrophobia   . Depression   . Eczema   . Frequency of urination   . History of frequent urinary tract infections   . History of gastroesophageal reflux (GERD)    05-25-2017  per pt no issues since hiatal hernia repair 01/ 2018  . History of hiatal hernia   . History of melanoma excision    early 2000s-- BACK  . History of panic attacks   . 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 Hypothyroidism   . Intermittent  palpitations    cardiology--  dr Martinique  . Interstitial cystitis   . Limited jaw range of motion    s/p  bilateral TMJ surgery,  age 45s  . Migraines   . Moderate persistent asthma    pulmologist-- dr Halford Chessman  . OA (osteoarthritis)    both knees  . OSA on CPAP    per last sleep study 09/ 2017  mild OSA, AHI13/hr  . PONV (postoperative nausea and vomiting)   . PVC (premature ventricular contraction)   . Rosacea   . Sensation of pressure in bladder area    per pt intermittant  . TMJ arthralgia   . Urticaria   . Wears glasses   . White coat syndrome without hypertension  05-25-2017  per pt hx hypertension yrs ago, no issues after quitting stressful job   Past Surgical History:  Procedure Laterality Date  . [place] STUDY N/A 09/22/2015   Procedure: [place] STUDY;  Surgeon: Doran Stabler, MD;  Location: [facility];  Service: Gastroenterology;  Laterality: N/A;  . ADENOIDECTOMY    . ANAL FISSURE REPAIR  age 24  (74)  . BREAST EXCISIONAL BIOPSY Right 04-16-2003  dr p. young  [place]   benign  . BUNIONECTOMY Right early 2000s  . CARPAL TUNNEL RELEASE Left age 57 (40)  . CHOLECYSTECTOMY OPEN  age 59  . CYSTO WITH HYDRODISTENSION N/A 06/02/2017   Procedure: CYSTOSCOPY/HYDRODISTENSION OF BLADDER;  Surgeon: Irine Seal, MD;  Location: [facility];  Service: Urology;  Laterality: N/A;  . CYSTO/ HYDRODISTENTION/  INSTILLATION THERAPY  1990s  . DX LAPAROSCOPY/  DX HYSTEROSCOPY/  D & C  08-07-2007   dr dove  [facility]  . ENDOMETRIAL ABLATION W/ NOVASURE  10-21-2008   dr Harolyn Rutherford  [facility]  . ESOPHAGEAL MANOMETRY N/A 09/22/2015   Procedure: ESOPHAGEAL MANOMETRY (EM);  Surgeon: Doran Stabler, MD;  Location: [facility];  Service: Gastroenterology;  Laterality: N/A;  with impedence   . FINGER ARTHROPLASTY Bilateral    left little finger 2016:  right middle finger 06/ 2018  . KNEE ARTHROSCOPY Bilateral x3 left ;  x3  right-- last one age 89 (58)  . NASAL SEPTUM SURGERY  age  80s  . ROBOT ASSISTED REDUCTION PARAESOPHAGEAL HIATAL HERNIA/ TYPE 2 MEDIASTINAL DISSECTION/ PRIMARY HIATAL HERNIA REPAIR/ ANTERIOR & POSTERIOR GASTROPEXY/ NISSEN FUNDOPLICATION  [phone]   DR GROSS  [facility]  . SINOSCOPY    . TEMPOROMANDIBULAR JOINT SURGERY Bilateral x3  -- age 73s   per pt used graft  . TONSILLECTOMY    . TOTAL KNEE ARTHROPLASTY Left 12/26/2017   Procedure: LEFT TOTAL KNEE ARTHROPLASTY;  Surgeon: Gaynelle Arabian, MD;  Location: [facility];  Service: Orthopedics;  Laterality: Left;  50min  . TRANSTHORACIC ECHOCARDIOGRAM  12-06-2017   dr Martinique   ef 55-60%, grade 1 diastoilc dysfunction, trivial MR  . TRIGGER FINGER RELEASE Bilateral last one 2017   several release's bilaterally  . ULNAR NERVE TRANSPOSITION Bilateral age 39 (45)   Family History  Problem Relation Age of Onset  . Hypertension Mother        deceased from MVA complications  . Thyroid disease Mother   . Allergic rhinitis Mother   . Testicular cancer Father   . Allergic rhinitis Father   . Colon polyps Sister   . Coronary artery disease Other   . Healthy Brother   . Healthy Sister    Social History   Socioeconomic History  . Marital status: Divorced    Spouse name: Not on file  . Number of children: 0  . Years of education: Not on file  . Highest education level: Bachelor's degree (e.g., BA, AB, BS)  Occupational History  . Not on file  Tobacco Use  . Smoking status: Never Smoker  . Smokeless tobacco: Never Used  Vaping Use  . Vaping Use: Never used  Substance and Sexual Activity  . Alcohol use: Yes    Alcohol/week: 0.0 standard drinks    Comment: once every 3-4 months  . Drug use: No  . Sexual activity: Not on file  Other Topics Concern  . Not on file  Social History Narrative  . Not on file   Social Determinants of Health  Financial Resource Strain: Low Risk   . Difficulty of Paying Living Expenses: Not hard at all  Food Insecurity: No Food Insecurity  . Worried About Sales executive in the Last Year: Never true  . Ran Out of Food in the Last Year: Never true  Transportation Needs: No Transportation Needs  . Lack of Transportation (Medical): No  . Lack of Transportation (Non-Medical): No  Physical Activity: Inactive  . Days of Exercise per Week: 0 days  . Minutes of Exercise per Session: 0 min  Stress: No Stress Concern Present  . Feeling of Stress : Not at all  Social Connections: Moderately Isolated  . Frequency of Communication with Friends and Family: More than three times a week  . Frequency of Social Gatherings with Friends and Family: Once a week  . Attends Religious Services: More than 4 times per year  . Active Member of Clubs or Organizations: No  . Attends Archivist Meetings: Never  . Marital Status: Divorced    Tobacco Counseling Counseling given: Not Answered   Clinical Intake:  Pre-visit preparation completed: Yes  Pain : 0-10 Pain Score: 2  Pain Type: Acute pain Pain Location: Wrist (hand) Pain Orientation: Left Pain Descriptors / Indicators: Sore, Tender Pain Onset: More than a month ago Pain Frequency: Intermittent Pain Relieving Factors: oxycodone  Pain Relieving Factors: oxycodone  Nutritional Risks: Unintentional weight gain Diabetes: No (Patient is prediabetic)  How often do you need to have someone help you when you read instructions, pamphlets, or other written materials from your doctor or pharmacy?: 1 - Never What is the last grade level you completed in school?: Bachelors Degree  Diabetic?No  Interpreter Needed?: No  Information entered by :: [place] of Daily Living In your present state of health, do you have any difficulty performing the following activities: 11/08/2019  Hearing? N  Vision? Y  Comment has floaters in vision in both eyes  Difficulty concentrating or making decisions? N  Walking or climbing stairs? Y  Comment has arthritis in right knee and gets pain  Dressing or  bathing? N  Doing errands, shopping? N  Preparing Food and eating ? N  Using the Toilet? N  In the past six months, have you accidently leaked urine? Y  Comment Has occassional bladder leakage with urgency  Do you have problems with loss of bowel control? N  Managing your Medications? N  Managing your Finances? N  Housekeeping or managing your Housekeeping? N  Some recent data might be hidden    Patient Care Team: Martinique, Betty G, MD as PCP - General (Family Medicine) Martinique, Peter M, MD as PCP - Cardiology (Cardiology) Clarene Reamer, MD as Consulting Physician (Psychiatry) Michael Boston, MD as Consulting Physician (General Surgery) Danis, Kirke Corin, MD as Consulting Physician (Gastroenterology)  Indicate any recent Medical Services you may have received from other than [facility] providers in the past year (date may be approximate).     Assessment:   This is a routine wellness examination for [name].  Hearing/Vision screen  Hearing Screening   125Hz  250Hz  500Hz  1000Hz  2000Hz  3000Hz  4000Hz  6000Hz  8000Hz   Right ear:           Left ear:           Vision Screening Comments: Patient states gets eyes checked bi-annually    Dietary issues and exercise activities discussed: Current Exercise Habits: The patient does not participate in regular exercise at present, Exercise limited by:  orthopedic condition(s)  Goals    . Exercise 3x per week (30 min per time)      Depression Screen PHQ 2/9 Scores 11/08/2019 10/31/2018 10/24/2017 05/30/2015 03/10/2015 11/21/2014  PHQ - 2 Score 0 2 1 3 4 5   PHQ- 9 Score 0 - - 9 16 14   Exception Documentation - - Other- indicate reason in comment box - - -  Not completed - - Hx of depression,bipolar,and anxiety. Follows with psychiatrist. - - -    Fall Risk Fall Risk  11/08/2019 10/31/2018 02/23/2018 11/08/2017 10/24/2017  Falls in the past year? 0 0 0 Yes Yes  Comment - - - - -  Number falls in past yr: 0 0 0 1 1  Injury with Fall? 0 0 0 No No  Risk  Factor Category  - - - - -  Risk for fall due to : Medication side effect Impaired balance/gait;Orthopedic patient - - -  Follow up Falls evaluation completed;Falls prevention discussed Education provided Falls evaluation completed Falls evaluation completed -  Comment - - - - -    Any stairs in or around the home? No  If so, are there any without handrails? No  Home free of loose throw rugs in walkways, pet beds, electrical cords, etc? Yes  Adequate lighting in your home to reduce risk of falls? Yes   ASSISTIVE DEVICES UTILIZED TO PREVENT FALLS:  Life alert? No  Use of a cane, walker or w/c? No  Grab bars in the bathroom? No  Shower chair or bench in shower? Yes  Elevated toilet seat or a handicapped toilet? No   TIMED UP AND GO:  Was the test performed? Yes .  Length of time to ambulate 10 feet: 8 sec.   Gait slow and steady without use of assistive device  Cognitive Function:    Cognition within normal limits based on direct observation    Immunizations Immunization History  Administered Date(s) Administered  . Influenza Split 02/09/2012  . Influenza Whole 12/08/2006, 12/01/2007  . Influenza,inj,Quad PF,6+ Mos 12/11/2013, 11/21/2014, 10/13/2016, 10/24/2017, 10/31/2018  . Influenza-Unspecified 12/01/2015  . Moderna SARS-COVID-2 Vaccination 04/28/2019, 05/29/2019  . Pneumococcal Polysaccharide-23 12/12/2013  . Tdap 04/16/2011, 06/25/2013  . Zoster Recombinat (Shingrix) 05/30/2018, 08/16/2018    TDAP status: Up to date Flu Vaccine status: Completed at today's visit Pneumococcal vaccine status: Up to date Covid-19 vaccine status: Completed vaccines  Qualifies for Shingles Vaccine? Yes   Zostavax completed No   Shingrix Completed?: Yes  Screening Tests Health Maintenance  Topic Date Due  . INFLUENZA VACCINE  12/12/2019 (Originally 09/09/2019)  . PAP SMEAR-Modifier  10/24/2020  . MAMMOGRAM  10/17/2021  . TETANUS/TDAP  06/26/2023  . COLONOSCOPY  04/02/2024  .  COVID-19 Vaccine  Completed  . Hepatitis C Screening  Completed  . HIV Screening  Completed    Health Maintenance  There are no preventive care reminders to display for this patient.  Colorectal cancer screening: Completed 04/02/2014. Repeat every 10 years Mammogram status: Completed 10/18/2019. Repeat every year Bone Density status: Completed 03/19/2016. Results reflect: Bone density results: OSTEOPOROSIS. Repeat every 2 years.  Lung Cancer Screening: (Low Dose CT Chest recommended if Age 67-80 years, 30 pack-year currently smoking OR have quit w/in 15years.) does not qualify.   Lung Cancer Screening Referral: N/A  Additional Screening:  Hepatitis C Screening: does qualify; Completed 11/21/2014  Vision Screening: Recommended annual ophthalmology exams for early detection of glaucoma and other disorders of the eye. Is the patient up to date with  their annual eye exam?  No  Who is the provider or what is the name of the office in which the patient attends annual eye exams? Does not have a particular doctor that she says that she see's a different provider each time. If pt is not established with a provider, would they like to be referred to a provider to establish care? No .   Dental Screening: Recommended annual dental exams for proper oral hygiene  Community Resource Referral / Chronic Care Management: CRR required this visit?  No   CCM required this visit?  No      Plan:     I have personally reviewed and noted the following in the patient's chart:   . Medical and social history . Use of alcohol, tobacco or illicit drugs  . Current medications and supplements . Functional ability and status . Nutritional status . Physical activity . Advanced directives . List of other physicians . Hospitalizations, surgeries, and ER visits in previous 12 months . Vitals . Screenings to include cognitive, depression, and falls . Referrals and appointments  In addition, I have  reviewed and discussed with patient certain preventive protocols, quality metrics, and best practice recommendations. A written personalized care plan for preventive services as well as general preventive health recommendations were provided to patient.     Ofilia Neas, LPN   9/48/0165   Nurse Notes: None

## 2019-11-13 DIAGNOSIS — R69 Illness, unspecified: Secondary | ICD-10-CM | POA: Diagnosis not present

## 2019-11-16 NOTE — Telephone Encounter (Signed)
Patient is aware of [name] not accepting at the moment

## 2019-11-19 DIAGNOSIS — M25632 Stiffness of left wrist, not elsewhere classified: Secondary | ICD-10-CM | POA: Diagnosis not present

## 2019-11-19 DIAGNOSIS — M25642 Stiffness of left hand, not elsewhere classified: Secondary | ICD-10-CM | POA: Diagnosis not present

## 2019-11-21 DIAGNOSIS — N3010 Interstitial cystitis (chronic) without hematuria: Secondary | ICD-10-CM | POA: Diagnosis not present

## 2019-11-21 DIAGNOSIS — R3915 Urgency of urination: Secondary | ICD-10-CM | POA: Diagnosis not present

## 2019-11-27 ENCOUNTER — Encounter: Payer: Self-pay | Admitting: Allergy & Immunology

## 2019-11-27 ENCOUNTER — Other Ambulatory Visit: Payer: Self-pay

## 2019-11-27 ENCOUNTER — Ambulatory Visit: Payer: Medicare HMO | Admitting: Allergy & Immunology

## 2019-11-27 VITALS — BP 104/60 | HR 84 | Temp 98.0°F | Ht 60.0 in

## 2019-11-27 DIAGNOSIS — J302 Other seasonal allergic rhinitis: Secondary | ICD-10-CM

## 2019-11-27 DIAGNOSIS — J3089 Other allergic rhinitis: Secondary | ICD-10-CM

## 2019-11-27 DIAGNOSIS — J4540 Moderate persistent asthma, uncomplicated: Secondary | ICD-10-CM | POA: Diagnosis not present

## 2019-11-27 MED ORDER — FLOVENT HFA 220 MCG/ACT IN AERO: 1.0000 | INHALATION_SPRAY | Freq: Two times a day (BID) | RESPIRATORY_TRACT | 2 refills | Status: DC

## 2019-11-27 MED ORDER — CARBINOXAMINE MALEATE 4 MG PO TABS
1.0000 | ORAL_TABLET | Freq: Three times a day (TID) | ORAL | 1 refills | Status: DC | PRN
Start: 2019-11-27 — End: 2020-06-12

## 2019-11-27 MED ORDER — ALBUTEROL SULFATE HFA 108 (90 BASE) MCG/ACT IN AERS: 1.0000 | INHALATION_SPRAY | Freq: Every day | RESPIRATORY_TRACT | 1 refills | Status: DC | PRN

## 2019-11-27 NOTE — Patient Instructions (Addendum)
1. Moderate persistent asthma, uncomplicated - Spirometry deferred today. - Spacer use reviewed. - Daily controller medication(s): Singulair 10mg  daily and Flovent 248mcg 1-2 puffs twice daily with spacer - Prior to physical activity: albuterol 2 puffs 10-15 minutes before physical activity. - Rescue medications: albuterol 4 puffs every 4-6 hours as needed - Changes during respiratory infections or worsening symptoms: Increase Flovent 249mcg to 4 puffs 2 times daily for TWO WEEKS. - Asthma control goals:  * Full participation in all desired activities (may need albuterol before activity) * Albuterol use two time or less a week on average (not counting use with activity) * Cough interfering with sleep two time or less a month * Oral steroids no more than once a year * No hospitalizations  2. Seasonal and perennial allergic rhinitis - Continue with Singulair 10mg  once daily. - Continue with carbinoxamine as needed. - Continue with azelastine one spray per nostril daily as needed.  - Continue with Pataday eye drops twice daily as needed.  3. Return in about 6 months (around 05/27/2020).    Please inform us of any Emergency Department visits, hospitalizations, or changes in symptoms. Call us before going to the ED for breathing or allergy symptoms since we might be able to fit you in for a sick visit. Feel free to contact us anytime with any questions, problems, or concerns.  It was a pleasure to meet you today!  Websites that have reliable patient information: 1. American Academy of Asthma, Allergy, and Immunology: www.aaaai.org 2. Food Allergy Research and Education (FARE): foodallergy.org 3. Mothers of Asthmatics: http://www.asthmacommunitynetwork.org 4. American College of Allergy, Asthma, and Immunology: www.acaai.org   COVID-19 Vaccine Information can be found at: ShippingScam.co.uk For questions related to vaccine  distribution or appointments, please email vaccine@[email or web address] .[email or web address] or call [phone].     "Like" [place] on Facebook and Instagram for our latest updates!     HAPPY FALL!     Make sure you are registered to vote! If you have moved or changed any of your contact information, you will need to get this updated before voting!  In some cases, you MAY be able to register to vote online: [email or web address]

## 2019-11-27 NOTE — Progress Notes (Signed)
FOLLOW UP  Date of Service/Encounter:  11/27/19   Assessment:   Moderate persistent asthma, uncomplicated  Seasonal and perennial allergic rhinitis   Fully vaccinated to COVID-19 [name]) - awaiting approval of the booster  Plan/Recommendations:   1. Moderate persistent asthma, uncomplicated - Spirometry deferred today. - Spacer use reviewed. - Daily controller medication(s): Singulair 10mg  daily and Flovent 210mcg 1-2 puffs twice daily with spacer - Prior to physical activity: albuterol 2 puffs 10-15 minutes before physical activity. - Rescue medications: albuterol 4 puffs every 4-6 hours as needed - Changes during respiratory infections or worsening symptoms: Increase Flovent 259mcg to 4 puffs 2 times daily for TWO WEEKS. - Asthma control goals:  * Full participation in all desired activities (may need albuterol before activity) * Albuterol use two time or less a week on average (not counting use with activity) * Cough interfering with sleep two time or less a month * Oral steroids no more than once a year * No hospitalizations  2. Seasonal and perennial allergic rhinitis - Continue with Singulair 10mg  once daily. - Continue with carbinoxamine as needed. - Continue with azelastine one spray per nostril daily as needed.  - Continue with Pataday eye drops twice daily as needed.  3. Return in about 6 months (around 05/27/2020).    Subjective:   [name] is a 64 y.o. 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   Acute maxillary/frontal sinusitis 06/25/2019   Iron deficiency anemia 04/28/2018   Ambulatory dysfunction 01/16/2018   Serotonin syndrome 01/16/2018   OA (osteoarthritis) of knee 12/26/2017   PVC (premature ventricular  contraction) 11/21/2017   Dysphonia 09/26/2017   Allergic conjunctivitis 08/01/2017   Abnormal liver function test 04/04/2017   Vitamin D deficiency, unspecified 10/13/2016   Hyperlipidemia 09/07/2016   Osteoporosis without current pathological fracture 03/22/2016   Paraesophageal hiatal hernia s/p robotic repair & fundoplication 9/79/4801 65/53/7482   Bipolar 1 disorder, mixed, moderate ([place]) 01/29/2016   Class 1 obesity with body mass index (BMI) of 32.0 to 32.9 in adult 01/06/2016   History of skin cancer 03/10/2015   Bilateral knee pain 12/06/2014   Migraines 11/21/2014   Increased frequency of urination 05/30/2013   Tremor 02/08/2012   OTH D/O MENSTRUATION&OTH ABN BLEED FE GNT TRACT 12/01/2007   PELVIC PAIN, HX OF 12/01/2007   Dyspareunia, female 03/24/2007   OSA on CPAP 01/30/2007   GERD 11/02/2006   DERMATITIS, HANDS 11/02/2006   Cough, persistent 11/02/2006   Hypothyroidism 10/07/2006   Bipolar affective ([place]) 10/07/2006   Essential hypertension 10/07/2006   RHINITIS, CHRONIC 10/07/2006   Perennial allergic rhinitis 10/07/2006   Moderate persistent asthma 10/07/2006   Irritable bowel syndrome 10/07/2006   INTERSTITIAL CYSTITIS 10/07/2006   ROSACEA 10/07/2006   Osteoarthritis of both knees 10/07/2006   Disturbance in sleep behavior 10/07/2006   Personal history of other malignant neoplasm of skin 10/07/2006    History obtained from: chart review and patient.  [name] is a 64 y.o. female presenting for a follow up visit.  She was last seen in May 2021.  At that time, she was started on prednisone and Augmentin for sinusitis.  For her allergic rhinitis, she was continued on Astelin as well as nasal saline lavage, montelukast, and carbinoxamine.  Her asthma was stable.  She was continued on Flovent 220 mcg 2 puffs twice a day as well as Incruse Ellipta 1 puff daily and Singulair 10 mg daily.  Since the last visit, she has done well.    Asthma/Respiratory Symptom History: She remains on the Flovent one puff twice daily, but she will go up to two puffs twice daily. She is on Incruse one puff once daily. She is good about using her medications. She has a rescue inhaler and she uses it a couple of times every few weeks. She has not needed prednisone for her breathing lately. She is sleeping well at night without wheezing ro coughing. The worst episode was around 25 years ago when she was hiking through the woods in the autumn. She also had another time here when she had spirometry performed that was very low here in the office and she received a nebulizer treatment. She is unsure of the trigger for htat one. Dust and mold are a definite trigger.   Allergic Rhinitis Symptom History: She remains on the montelukast. She has the carbinoxamine which she takes every day. She denies any sleepiness with this.She does not use her eye drops routinely. She has been on shots in the past on two occasions.   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 She undergoes speech therapy for this and sees Dr. Benjamine Mola.  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  She has bipolar disease as well, for which she receives disability.  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    Otherwise, there have been no changes to her past medical history, surgical history, family history, or social history.    Review of Systems  Constitutional: Negative.  Negative for chills, fever, malaise/fatigue and weight loss.  HENT: Negative.  Negative for congestion, ear discharge, ear pain and sore throat.   Eyes: Negative for pain, discharge and redness.  Respiratory: Negative for cough, sputum production, shortness of breath and wheezing.   Cardiovascular: Negative.  Negative for chest pain and palpitations.  Gastrointestinal: Negative for abdominal pain, constipation, diarrhea, heartburn, nausea and  vomiting.  Skin: Negative.  Negative for itching and rash.  Neurological: Negative for dizziness and headaches.  Endo/Heme/Allergies: Negative for environmental allergies. Does not bruise/bleed easily.       Objective:   Blood pressure 104/60, pulse 84, temperature 98 F (36.7 C), temperature source Temporal, height 5' (1.524 m), last menstrual period 03/05/2012, SpO2 96 %. Body mass index is 34.79 kg/m.   Physical Exam:  Physical Exam Constitutional:      Appearance: She is well-developed.     Comments: Very pleasant.  Talkative.  Reading a book.  HENT:     Head: Normocephalic and atraumatic.     Right Ear: Tympanic membrane, ear canal and external ear normal.     Left Ear: Tympanic membrane, ear canal and external ear normal.     Nose: No nasal deformity, septal deviation, mucosal edema or rhinorrhea.     Right Turbinates: Enlarged and swollen.     Left Turbinates: Enlarged and swollen.     Right Sinus: No maxillary sinus tenderness or frontal sinus tenderness.     Left Sinus: No maxillary sinus tenderness or frontal sinus tenderness.     Mouth/Throat:     Mouth: Mucous membranes are not pale and not dry.     Pharynx: Uvula midline.     Comments: Cobblestoning present in the  posterior oropharynx. Eyes:     General:        Right eye: No discharge.        Left eye: No discharge.     Conjunctiva/sclera: Conjunctivae normal.     Right eye: Right conjunctiva is not injected. No chemosis.    Left eye: Left conjunctiva is not injected. No chemosis.    Pupils: Pupils are equal, round, and reactive to light.  Cardiovascular:     Rate and Rhythm: Normal rate and regular rhythm.     Heart sounds: Normal heart sounds.  Pulmonary:     Effort: Pulmonary effort is normal. No tachypnea, accessory muscle usage or respiratory distress.     Breath sounds: Normal breath sounds. No wheezing, rhonchi or rales.     Comments: Moving air well in all lung fields.  No increased work of  breathing. Chest:     Chest wall: No tenderness.  Lymphadenopathy:     Cervical: No cervical adenopathy.  Skin:    Coloration: Skin is not pale.     Findings: No abrasion, erythema, petechiae or rash. Rash is not papular, urticarial or vesicular.  Neurological:     Mental Status: She is alert.      Diagnostic studies: none      Salvatore Marvel, MD  [facility]

## 2019-11-28 ENCOUNTER — Other Ambulatory Visit: Payer: Self-pay | Admitting: Family Medicine

## 2019-11-28 DIAGNOSIS — I493 Ventricular premature depolarization: Secondary | ICD-10-CM

## 2019-11-28 DIAGNOSIS — I10 Essential (primary) hypertension: Secondary | ICD-10-CM

## 2019-12-03 DIAGNOSIS — M25642 Stiffness of left hand, not elsewhere classified: Secondary | ICD-10-CM | POA: Diagnosis not present

## 2019-12-06 DIAGNOSIS — G4733 Obstructive sleep apnea (adult) (pediatric): Secondary | ICD-10-CM | POA: Diagnosis not present

## 2019-12-11 DIAGNOSIS — R69 Illness, unspecified: Secondary | ICD-10-CM | POA: Diagnosis not present

## 2019-12-11 DIAGNOSIS — L821 Other seborrheic keratosis: Secondary | ICD-10-CM | POA: Diagnosis not present

## 2019-12-11 DIAGNOSIS — L219 Seborrheic dermatitis, unspecified: Secondary | ICD-10-CM | POA: Diagnosis not present

## 2019-12-11 DIAGNOSIS — L719 Rosacea, unspecified: Secondary | ICD-10-CM | POA: Diagnosis not present

## 2019-12-11 DIAGNOSIS — L820 Inflamed seborrheic keratosis: Secondary | ICD-10-CM | POA: Diagnosis not present

## 2019-12-11 DIAGNOSIS — D225 Melanocytic nevi of trunk: Secondary | ICD-10-CM | POA: Diagnosis not present

## 2019-12-11 DIAGNOSIS — Z86018 Personal history of other benign neoplasm: Secondary | ICD-10-CM | POA: Diagnosis not present

## 2019-12-11 DIAGNOSIS — L304 Erythema intertrigo: Secondary | ICD-10-CM | POA: Diagnosis not present

## 2019-12-11 DIAGNOSIS — L814 Other melanin hyperpigmentation: Secondary | ICD-10-CM | POA: Diagnosis not present

## 2019-12-11 DIAGNOSIS — Z85828 Personal history of other malignant neoplasm of skin: Secondary | ICD-10-CM | POA: Diagnosis not present

## 2019-12-13 NOTE — Telephone Encounter (Signed)
Not at this time.  Thanks,  Dr .Rogers Blocker

## 2019-12-13 NOTE — Telephone Encounter (Signed)
Patient is calling in asking to do a TOC to [facility] as Dr.Andy isnt accepting new patients/toc.

## 2019-12-14 ENCOUNTER — Telehealth: Payer: Self-pay | Admitting: Pharmacist

## 2019-12-14 NOTE — Telephone Encounter (Signed)
Not at this time. [name]

## 2019-12-14 NOTE — Chronic Care Management (AMB) (Signed)
Chronic Care Management Pharmacy Assistant   Name: [name]  MRN: [id number] DOB: 1955/06/01  Reason for Encounter: Medication Review/Initial Questions for Pharmacist visit on 12/17/2019  Patient Questions: 1. Have you seen any other providers since your last visit?  Roger Shelter, Nicki Reaper A-Occupational Therapist 2. Any changes in your medications or health? No 3. Any side effects from any medications? No 4. Do you have any symptoms or problems not managed by your medications? .   Furosemide 20 mg- she states she does not see a difference in the swelling 5. Any concerns about your health right now? No 6. Has your provider asked that you check blood pressure, blood sugar, or follow a special diet at home? No 7. Do you get any type of exercise regularly? No 8. Can you think of a goal you would like to reach for your health?  . Yes, she would like to lose about 10 pounds 9. Do you have any problems getting your medications? No  10. Is there anything that you would like to discuss during the appointment? No  PCP : [place], Betty G, MD  Allergies:   Allergies  Allergen Reactions  . Emetrol Itching  . Indomethacin Other (See Comments)    Muscle spasms Causes muscle spasms in neck  . Iodinated Diagnostic Agents Itching    Flushed and Fever, itch all over  . Amlodipine Swelling    Peripheral edema  . Carbamazepine Other (See Comments)    Easy and unusual bleeding   . Pseudoephedrine Other (See Comments)    I fly off the walls  CANNOT SLEEP  . Risperidone And Related Other (See Comments)    Stomach upset, insomnia, drooling, tremors, "jerks," sensitivity to touch.   . Bupivacaine Hives  . Pentazocine Other (See Comments)    Unknown reaction MADE ME "LACTATE"  . Propoxyphene Itching and Nausea And Vomiting    darvocet  . Sulfa Antibiotics Other (See Comments)    Unknown reaction  . Tramadol Other (See Comments)    Patient can not remember  . Aspirin Other (See  Comments)    upset stomach, ringing in the ears  . Codeine Itching and Other (See Comments)    Anything related to codeine  . Etodolac Rash and Other (See Comments)  . Propranolol Nausea Only and Other (See Comments)    Medications: Outpatient Encounter Medications as of 12/14/2019  Medication Sig  . acetaminophen (TYLENOL) 500 MG tablet Take 500 mg by mouth every 6 (six) hours as needed.  Marland Kitchen albuterol (PROVENTIL) (2.5 MG/3ML) 0.083% nebulizer solution TAKE 3 MLS BY NEBULIZATION EVERY 6 HOURS AS NEEDED FOR WHEEZING OR SHORTNESS OF BREATH. (Patient not taking: Reported on 11/08/2019)  . albuterol (VENTOLIN HFA) 108 (90 Base) MCG/ACT inhaler Inhale 1-2 puffs into the lungs daily as needed for wheezing or shortness of breath.  Marland Kitchen alendronate (FOSAMAX) 70 MG tablet TAKE 1 TABLET BY MOUTH EVERY 7 DAYS WITH FULL GLASS OF WATER ON EMPTY STOMACH  . ARIPiprazole (ABILIFY) 10 MG tablet Take 10 mg by mouth every morning.  . Ascorbic Acid (VITAMIN C CR) 500 MG CPCR Take by mouth.  . Azelastine HCl 0.15 % SOLN PLACE 1-2 SPRAYS INTO BOTH NOSTRILS 2 (TWO) TIMES DAILY.  . Carbinoxamine Maleate 4 MG TABS Take 1 tablet (4 mg total) by mouth every 8 (eight) hours as needed.  . clonazePAM (KLONOPIN) 0.5 MG tablet 2 (two) times daily.   . [employer] D3 125 MCG (5000 UT) capsule TAKE 1 CAPSULE (  5,000 UNITS TOTAL) BY MOUTH DAILY.  Marland Kitchen estradiol (ESTRACE) 0.1 MG/GM vaginal cream PLACE 1 APPLICATORFUL VAGINALLY ONCE A WEEK.  . famotidine (PEPCID) 20 MG tablet Take 20 mg by mouth at bedtime.  . fluticasone (FLONASE) 50 MCG/ACT nasal spray PLACE 1 SPRAY INTO BOTH NOSTRILS 2 (TWO) TIMES DAILY AS NEEDED FOR ALLERGIES OR RHINITIS.  . fluticasone (FLOVENT HFA) 220 MCG/ACT inhaler Inhale 1-2 puffs into the lungs in the morning and at bedtime.  . furosemide (LASIX) 20 MG tablet TAKE 1 TABLET (20 MG TOTAL) BY MOUTH DAILY AS NEEDED FOR EDEMA.  . hydrocortisone 2.5 % ointment Apply topically 2 (two) times daily.  Marland Kitchen ketoconazole (NIZORAL)  2 % cream Apply topically daily.   Marland Kitchen levothyroxine (SYNTHROID) 50 MCG tablet TAKE 1 TABLET EVERY DAY IN THE MORNING  . methocarbamol (ROBAXIN) 500 MG tablet Take 500 mg by mouth every 6 (six) hours as needed.  . metoprolol tartrate (LOPRESSOR) 25 MG tablet TAKE 1 TABLET BY MOUTH TWICE A DAY  . metroNIDAZOLE (METROCREAM) 0.75 % cream Apply 1 application topically 2 (two) times daily.  . miconazole (MICOTIN) 2 % cream Apply 1 application topically 2 (two) times daily.  . montelukast (SINGULAIR) 10 MG tablet TAKE 1 TABLET BY MOUTH EVERYDAY AT BEDTIME  . MYRBETRIQ 25 MG TB24 tablet Take 25 mg by mouth daily.  . Olopatadine HCl (PATADAY) 0.2 % SOLN Place 1 drop into both eyes daily as needed.  . ondansetron (ZOFRAN) 4 MG tablet Take 1 tablet (4 mg total) by mouth 2 (two) times daily as needed for nausea or vomiting.  Marland Kitchen oxyCODONE (OXY IR/ROXICODONE) 5 MG immediate release tablet oxycodone 5 mg tablet  1 po q4-6 hrs prn pain  . rizatriptan (MAXALT-MLT) 10 MG disintegrating tablet TAKE 1 TABLET EVERY DAY AS NEEDED FOR MIGRAINE, MAY REPEAT IN 2 HRS IF NEEDED. MAX 2 DOSES/WK  . rosuvastatin (CRESTOR) 20 MG tablet TAKE 1 TABLET BY MOUTH EVERY DAY  . sertraline (ZOLOFT) 100 MG tablet Take 200 mg by mouth at bedtime.   . sodium chloride (OCEAN) 0.65 % SOLN nasal spray Place 1 spray into both nostrils as needed for congestion.  . trihexyphenidyl (ARTANE) 2 MG tablet Take 2 mg by mouth daily.  Marland Kitchen zinc gluconate 50 MG tablet Take 50 mg by mouth daily.  . Zinc Oxide 40 % PSTE Apply topically.  Marland Kitchen zolpidem (AMBIEN) 10 MG tablet Take 10 mg by mouth at bedtime.   Facility-Administered Encounter Medications as of 12/14/2019  Medication  . predniSONE (DELTASONE) tablet 10 mg    Current Diagnosis: Patient Active Problem List   Diagnosis Date Noted  . 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 Acute maxillary/frontal sinusitis 06/25/2019  . Iron deficiency anemia 04/28/2018  . Ambulatory dysfunction  01/16/2018  . Serotonin syndrome 01/16/2018  . OA (osteoarthritis) of knee 12/26/2017  . PVC (premature ventricular contraction) 11/21/2017  . Dysphonia 09/26/2017  . Allergic conjunctivitis 08/01/2017  . Abnormal liver function test 04/04/2017  . Vitamin D deficiency, unspecified 10/13/2016  . Hyperlipidemia 09/07/2016  . Osteoporosis without current pathological fracture 03/22/2016  . Paraesophageal hiatal hernia s/p robotic repair & fundoplication 2/42/3536 14/43/1540  . Bipolar 1 disorder, mixed, moderate ([place]) 01/29/2016  . Class 1 obesity with body mass index (BMI) of 32.0 to 32.9 in adult 01/06/2016  . History of skin cancer 03/10/2015  . Bilateral knee pain 12/06/2014  . Migraines 11/21/2014  . Increased frequency of urination 05/30/2013  . Tremor 02/08/2012  . OTH D/O MENSTRUATION&OTH ABN BLEED  FE GNT TRACT 12/01/2007  . PELVIC PAIN, HX OF 12/01/2007  . Dyspareunia, female 03/24/2007  . OSA on CPAP 01/30/2007  . GERD 11/02/2006  . DERMATITIS, HANDS 11/02/2006  . Cough, persistent 11/02/2006  . Hypothyroidism 10/07/2006  . Bipolar affective ([place]) 10/07/2006  . Essential hypertension 10/07/2006  . RHINITIS, CHRONIC 10/07/2006  . Perennial allergic rhinitis 10/07/2006  . Moderate persistent asthma 10/07/2006  . Irritable bowel syndrome 10/07/2006  . INTERSTITIAL CYSTITIS 10/07/2006  . ROSACEA 10/07/2006  . Osteoarthritis of both knees 10/07/2006  . Disturbance in sleep behavior 10/07/2006  . Personal history of other malignant neoplasm of skin 10/07/2006    Goals Addressed   None     Follow-Up:  Pharmacist Review   Maia Breslow, [place] Assistant [phone]

## 2019-12-14 NOTE — Telephone Encounter (Signed)
Pt returned call. Informed pt that Dr. Rogers Blocker was unable to accept her transfer. Pt asked if she could transfer care to [facility]. Ok to transfer? Please advise.

## 2019-12-14 NOTE — Telephone Encounter (Signed)
LVM letting patient know we were unable to do TOC to Dr.Wolfe.

## 2019-12-17 ENCOUNTER — Ambulatory Visit: Payer: Medicare HMO | Admitting: Pharmacist

## 2019-12-17 DIAGNOSIS — I10 Essential (primary) hypertension: Secondary | ICD-10-CM

## 2019-12-17 DIAGNOSIS — E782 Mixed hyperlipidemia: Secondary | ICD-10-CM

## 2019-12-17 NOTE — Chronic Care Management (AMB) (Signed)
Chronic Care Management Pharmacy  Name: [name]  MRN: [id number] DOB: 1955/03/15  Initial Planning Appointment: completed 12/14/19  Initial Questions: 1. Have you seen any other providers since your last visit? n/a 2. Any changes in your medicines or health? No   Chief Complaint/ HPI  [name],  64 y.o. , female presents for their Initial CCM visit with the clinical pharmacist via telephone due to COVID-19 Pandemic.  PCP : Martinique, Betty G, MD  Their chronic conditions include: Asthma, allergic rhinitis, HTN, HLD, Bipolar 1 disorder, anxiety, osteoporosis, hypothyroidism, tremors, migraines, GERD, OAB, insomnia, fluid retention, pain  Office Visits: -11/08/19 Ofilia Neas, LPN: Patient presented for medicare annual wellness exam.   -09/11/19 Betty Martinique, MD: Patient presented for annual exam. A1c increased from 6.1 to 6.4% - discussed trying metformin but patient preferred to work on lifestyle changes. LFTs and lipid panel were WNL. Increased ambien to 10 mg. Follow up in 6 months.  -07/27/19 Betty Martinique, MD: Patient presented with foot swelling and sore spot on tongue. TSH, vit D and BMP were WNL. Bilateral feet and leg swelling started a couple of weeks ago. Prescribed furosemide 20 mg PRN for swelling. Did not note any tongue lesions and recommended dental follow up. Follow up in 4 months.  Consult Visit: -12/03/19 Ann Lions, OT [facility]): Patient presented for stiffness of joint of left hand follow up. Unable to access notes.  -11/27/19 Salvatore Marvel, MD (allergy): Patient presented for asthma follow up. Flovent was increased to 4 puffs twice daily for 2 weeks. Follow up in 6 months.  -11/19/19 Ann Lions, OT [facility]): Patient presented for stiffness of joint of left hand follow up. Unable to access notes.  -11/06/19 Harlin Heys [facility]): Patient presented for orthopedic surgery follow up. Unable to access notes.  09/07/19 Gaynelle Arabian (orthopedic surgery): Patient presented for follow up for history of left knee replacement.   07/26/19 [facility]: Patient presented for follow up. Unable to access notes.  06/25/19 Golda Acre, MD (allergy): Patient presented for asthma follow up and sick visit. Patient reports over the past 5 days she has experienced progressively increasing sinus pressure over the cheekbones and forehead, as well as thick postnasal drainage. Prescribed Augmentin x 10 days, olopatadine eye drops PRN and prednisone taper.  06/21/19 Leta Baptist (otolaryngology): Patient presented for follow up. Unable to access notes.  06/07/19 Sharyne Peach (ophthalmology): Patient presented for eye exam. Unable to access notes.  Medications: Outpatient Encounter Medications as of 12/17/2019  Medication Sig  . acetaminophen (TYLENOL) 500 MG tablet Take 500 mg by mouth every 6 (six) hours as needed.  Marland Kitchen albuterol (PROVENTIL) (2.5 MG/3ML) 0.083% nebulizer solution TAKE 3 MLS BY NEBULIZATION EVERY 6 HOURS AS NEEDED FOR WHEEZING OR SHORTNESS OF BREATH.  Marland Kitchen albuterol (VENTOLIN HFA) 108 (90 Base) MCG/ACT inhaler Inhale 1-2 puffs into the lungs daily as needed for wheezing or shortness of breath.  Marland Kitchen alendronate (FOSAMAX) 70 MG tablet TAKE 1 TABLET BY MOUTH EVERY 7 DAYS WITH FULL GLASS OF WATER ON EMPTY STOMACH  . ARIPiprazole (ABILIFY) 10 MG tablet Take 10 mg by mouth every morning.  . Ascorbic Acid (VITAMIN C CR) 500 MG CPCR Take by mouth.  . Azelastine HCl 0.15 % SOLN PLACE 1-2 SPRAYS INTO BOTH NOSTRILS 2 (TWO) TIMES DAILY.  . Carbinoxamine Maleate 4 MG TABS Take 1 tablet (4 mg total) by mouth every 8 (eight) hours as needed.  . clonazePAM (KLONOPIN) 0.5 MG tablet 0.5 mg  2 (two) times daily as needed.   . [facility] D3 125 MCG (5000 UT) capsule TAKE 1 CAPSULE (5,000 UNITS TOTAL) BY MOUTH DAILY.  . estradiol (ESTRACE) 0.1 MG/GM vaginal cream PLACE 1 APPLICATORFUL VAGINALLY ONCE A WEEK.  . famotidine (PEPCID) 20  MG tablet Take 20 mg by mouth at bedtime.  . fluticasone (FLONASE) 50 MCG/ACT nasal spray PLACE 1 SPRAY INTO BOTH NOSTRILS 2 (TWO) TIMES DAILY AS NEEDED FOR ALLERGIES OR RHINITIS.  . fluticasone (FLOVENT HFA) 220 MCG/ACT inhaler Inhale 1-2 puffs into the lungs in the morning and at bedtime.  . furosemide (LASIX) 20 MG tablet TAKE 1 TABLET (20 MG TOTAL) BY MOUTH DAILY AS NEEDED FOR EDEMA.  . hydrocortisone 2.5 % ointment Apply topically 2 (two) times daily.  . ketoconazole (NIZORAL) 2 % cream Apply topically daily.   . levothyroxine (SYNTHROID) 50 MCG tablet TAKE 1 TABLET EVERY DAY IN THE MORNING  . metoprolol tartrate (LOPRESSOR) 25 MG tablet TAKE 1 TABLET BY MOUTH TWICE A DAY  . metroNIDAZOLE (METROCREAM) 0.75 % cream Apply 1 application topically 2 (two) times daily.  . montelukast (SINGULAIR) 10 MG tablet TAKE 1 TABLET BY MOUTH EVERYDAY AT BEDTIME  . MYRBETRIQ 25 MG TB24 tablet Take 25 mg by mouth daily.  . ondansetron (ZOFRAN) 4 MG tablet Take 1 tablet (4 mg total) by mouth 2 (two) times daily as needed for nausea or vomiting.  . oxyCODONE (OXY IR/ROXICODONE) 5 MG immediate release tablet oxycodone 5 mg tablet  1 po q4-6 hrs prn pain  . rizatriptan (MAXALT-MLT) 10 MG disintegrating tablet TAKE 1 TABLET EVERY DAY AS NEEDED FOR MIGRAINE, MAY REPEAT IN 2 HRS IF NEEDED. MAX 2 DOSES/WK  . rosuvastatin (CRESTOR) 20 MG tablet TAKE 1 TABLET BY MOUTH EVERY DAY  . sertraline (ZOLOFT) 100 MG tablet Take 200 mg by mouth at bedtime.   . sodium chloride (OCEAN) 0.65 % SOLN nasal spray Place 1 spray into both nostrils as needed for congestion.  . trihexyphenidyl (ARTANE) 2 MG tablet Take 2 mg by mouth daily.  . zinc gluconate 50 MG tablet Take 50 mg by mouth daily.  . Zinc Oxide 40 % PSTE Apply topically.  . zolpidem (AMBIEN) 10 MG tablet Take 10 mg by mouth at bedtime.  . methocarbamol (ROBAXIN) 500 MG tablet Take 500 mg by mouth every 6 (six) hours as needed. (Patient not taking: Reported on 12/17/2019)    . Olopatadine HCl (PATADAY) 0.2 % SOLN Place 1 drop into both eyes daily as needed. (Patient not taking: Reported on 12/17/2019)  . [DISCONTINUED] miconazole (MICOTIN) 2 % cream Apply 1 application topically 2 (two) times daily.   Facility-Administered Encounter Medications as of 12/17/2019  Medication  . predniSONE (DELTASONE) tablet 10 mg   Patient lives alone and is on disability. She takes care of her cats and has been sleeping more than she should lately. She spends most of her day watching certain TV programs and two soap operas in the middle of the day. She has 3 siblings in [place] thatshe does not see often but admits that they have been making more of an effort now than they did before.  Patient reports she had hand surgery on August 19th and goes back tomorrow for another evaluation and OT session. She hopes to get her hand splint off. She also has some vocal cord problems and does not have her full range right now. 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  Patient   doesn't do a lot of cooking but does the cleaning and only asks the neighbor for bringing heavy trash out. For breakfast she eats shredded wheat and yogurt, for lunch she eats a protein bar and for supper she sometimes goes out to eat or has leftovers. Other nights she eats sandwiches, raw veggies or lunch meat without the bread and doesn't eat a lot of meat. She hasn't done a lot of exercise lately and needs to get back into walking. She stopped as she was bored with her current route.  She admits that she still has problems falling asleep and gets up to go to the bathroom in the night. She reports the Ambien does help some.  She denies any problems with her current medications other than one of them makes her mouth really dry and that the furosemide is not helping enough.  Current Diagnosis/Assessment:  Goals Addressed            This Visit's Progress   . Pharmacy Care Plan       CARE PLAN  ENTRY (see longitudinal plan of care for additional care plan information)  Current Barriers:  . Chronic Disease Management support, education, and care coordination needs related to Hypertension, Hyperlipidemia, GERD, Asthma, Hypothyroidism, Anxiety, Osteoporosis, Overactive Bladder, and fluid retention   Hypertension BP Readings from Last 3 Encounters:  11/27/19 104/60  11/08/19 118/70  09/11/19 120/78   . Pharmacist Clinical Goal(s): o Over the next 90 days, patient will work with PharmD and providers to maintain BP goal <140/90 . Current regimen:   Metoprolol tartrate 25 mg 1 tablet twice daily . Interventions: o Discussed DASH eating plan recommendations: . Emphasizes vegetables, fruits, and whole-grains . Includes fat-free or low-fat dairy products, fish, poultry, beans, nuts, and vegetable oils . Limits foods that are high in saturated fat. These foods include fatty meats, full-fat dairy products, and tropical oils such as coconut, palm kernel, and palm oils. . Limits sugar-sweetened beverages and sweets . Limiting sodium intake to < 1500 mg/day o Discussed recommendations for moderate aerobic exercise for 150 minutes/week spread out over 5 days for heart healthy lifestyle o Discussed the importance of checking blood pressure at home . Patient self care activities - Over the next 90 days, patient will: o Check blood pressure weekly, document, and provide at future appointments o Ensure daily salt intake < 2300 mg/day o Bring BP cuff to next in office visit to help with directions for use  Hyperlipidemia Lab Results  Component Value Date/Time   LDLCALC 79 09/11/2019 12:01 PM   LDLDIRECT 143.0 09/14/2016 10:37 AM   . Pharmacist Clinical Goal(s): o Over the next 90 days, patient will work with PharmD and providers to maintain LDL goal < 100 . Current regimen:  o Rosuvastatin 20 mg 1 tablet daily . Interventions: o Discussed lowering cholesterol through diet  by: . Limiting foods with cholesterol such as liver and other organ meats, egg yolks, shrimp, and whole milk dairy products . Avoiding saturated fats and trans fats and incorporating healthier fats, such as lean meat, nuts, and unsaturated oils like canola and olive oils . Eating foods with soluble fiber such as whole-grain cereals such as oatmeal and oat bran, fruits such as apples, bananas, oranges, pears, and prunes, legumes such as kidney beans, lentils, chick peas, black-eyed peas, and lima beans, and green leafy vegetables . Limiting alcohol intake . Patient self care activities - Over the next 90 days, patient will: o Continue current medications  Asthma .   Pharmacist Clinical Goal(s): o Over the next 90 days, patient will work with PharmD and providers to control symptoms of asthma . Current regimen:  . Albuterol nebulizer 0.083% 3 mLs by nebulization PRN (only used a few times since shes had it) . Ventolin HFA 1-2 puffs as needed for wheezing or shortness of breath . Flovent HFA 220 mcg/act 1-2 puffs in the morning and at bedtime . Montelukast 10 mg 1 tablet every day at bedtime . Interventions: o Discuss proper inhaler technique and rinsing mouth out with each use of Flovent . Patient self care activities - Over the next 90 days, patient will: o Continue current medications  Hypothyroidism . Pharmacist Clinical Goal(s) o Over the next 90 days, patient will work with PharmD and providers to maintain TSH between 0.35-4.50 . Current regimen:  o Levothyroxine 50 mcg 1 tablet daily in the morning . Interventions: o Discussed taking levothyroxine 30 minutes after alendronate and 30 minutes before food o Recommended taking prior to lunch on Sundays if within a couple of hours of typical administration time . Patient self care activities - Over the next 90 days, patient will: o Continue current medications o Establish a routine with taking levothyroxine consistently and without  missing doses  Anxiety . Pharmacist Clinical Goal(s) o Over the next 90 days, patient will work with PharmD and providers to manage symptoms of anxiety . Current regimen:  . Clonazepam 0.5 mg 1 tablet twice daily . Sertraline 100 mg 2 tablets in the morning . Interventions: o Discussed the possibility of switching to other medications for anxiety . Patient self care activities - Over the next 90 days, patient will: o Discuss with Brittany Mitchell about changing medications for anxiety  Osteoporosis . Pharmacist Clinical Goal(s) o Over the next 90 days, patient will work with PharmD and providers to improve bone density and prevent fractures . Current regimen:  . Alendronate 70 mg 1 tablet every 7 days . Vitamin D3 5000 units 1 capsule daily . Interventions: o Discussed recommended 800-1000 units of vitamin D daily and 1200 mg of calcium daily from dietary and supplemental sources.  o Counseled on oral bisphosphonate administration: take in the morning, 30 minutes prior to food with 6-8 oz of water. Do not lie down for at least 30 minutes after taking.  o Recommended weight-bearing and muscle strengthening exercises for building and maintaining bone density. . Patient self care activities - Over the next 90 days, patient will: o Continue current medications o Ensure adequate supplementation with calcium  GERD . Pharmacist Clinical Goal(s) o Over the next 90 days, patient will work with PharmD and providers to minimize symptoms of heartburn . Current regimen:  o Famotidine 20 mg 1 tablet at bedtime . Interventions: o Discussed the option of increasing dose of famotidine . Patient self care activities - Over the next 90 days, patient will: o Reach out to Dr. Teoh about increasing dose or frequency of famotidine  Overactive bladder . Pharmacist Clinical Goal(s) o Over the next 90 days, patient will work with PharmD and providers to manage symptoms of overactive bladder . Current  regimen:  o Myrbetriq 25 mg 1 tablet daily . Interventions: o Discussed the possibility of increasing dose of Myrbetriq for additional bladder control . Patient self care activities - Over the next 90 days, patient will: o Discuss with Dr. Wrenn about increasing dose of Myrbetriq  Fluid retention . Pharmacist Clinical Goal(s) o Over the next 90 days, patient will work with PharmD and   providers to improve fluid retention . Current regimen:  o Furosemide 20 mg 1 tablet daily as needed  . Interventions: o Discussed the possibility of increasing the dose of furosemide for additional fluid retention . Patient self care activities - Over the next 90 days, patient will: o Continue current medications  Medication management . Pharmacist Clinical Goal(s): o Over the next 90 days, patient will work with PharmD and providers to maintain optimal medication adherence . [facility] . Interventions o Comprehensive medication review performed. o Continue current medication management strategy . Patient self care activities - Over the next 90 days, patient will: o Focus on medication adherence by taking levothyroxine prior to lunch on days that she cannot take it in the morning o Take medications as prescribed o Report any questions or concerns to PharmD and/or provider(s)  Initial goal documentation       SDOH Interventions     Most Recent Value  SDOH Interventions  Financial Strain Interventions Intervention Not Indicated  Transportation Interventions Intervention Not Indicated      Asthma   Last spirometry score: 06/25/19 FEV1 109% predicted  Patient has failed these meds in past: none Patient is currently controlled on the following medications:  . Albuterol nebulizer 0.083% 3 mLs by nebulization PRN (only used a few times since shes had it) . Ventolin HFA 1-2 puffs as needed for wheezing or shortness of breath . Flovent HFA 220 mcg/act 1-2 puffs in the morning and at  bedtime . Montelukast 10 mg 1 tablet every day at bedtime  Using maintenance inhaler regularly? Yes Frequency of rescue inhaler use:  a couple times a month  We discussed:  proper inhaler technique; gets samples as needed -Patient denies any problems affording inhalers as she current qualifies for medicare extra help  Plan  Continue current medications  Allergic rhinitis   Patient has failed these meds in past: unknown Patient is currently controlled on the following medications:  . Carbinoxamine maleate 4 mg 1 tablet every 8 hours as needed   . Ocean nasal spray 0.65% 1 spray in both nostrils as needed . Olopatadine 0.2% solution 1 drop in both eyes daily as needed (hasn't used in a couple of months) . Azelastine 0.15% 1-2 sprays in both nostrils twice daily . Flonase 50 mcg/act 1 spray in both nostrils twice daily as needed  We discussed: avoiding allergy triggers  Plan Managed by allergist.  Continue current medications   Hypertension  Some lightheadednesss.   BP goal is:  <140/90  Office blood pressures are  BP Readings from Last 3 Encounters:  11/27/19 104/60  11/08/19 118/70  09/11/19 120/78   Patient checks BP at home none Patient home BP readings are ranging: 116/60  Patient has failed these meds in the past: none  Patient is currently controlled on the following medications:   Metoprolol tartrate 25 mg 1 tablet twice daily  We discussed diet and exercise extensively  -Diet: eats a lot of canned soup -DASH eating plan recommendations: . Emphasizes vegetables, fruits, and whole-grains . Includes fat-free or low-fat dairy products, fish, poultry, beans, nuts, and vegetable oils . Limits foods that are high in saturated fat. These foods include fatty meats, full-fat dairy products, and tropical oils such as coconut, palm kernel, and palm oils. . Limits sugar-sweetened beverages and sweets . Limiting sodium intake to < 1500 mg/day -Discussed  recommendations for moderate aerobic exercise for 150 minutes/week spread out over 5 days for heart healthy lifestyle  Plan Recommended  for patient to bring BP cuff to her next appointment to get instructions for use. Continue current medications     Hyperlipidemia   LDL goal < 100  Last lipids Lab Results  Component Value Date   CHOL 174 09/11/2019   HDL 78 09/11/2019   LDLCALC 79 09/11/2019   LDLDIRECT 143.0 09/14/2016   TRIG 89 09/11/2019   CHOLHDL 2.2 09/11/2019   Hepatic Function Latest Ref Rng & Units 09/11/2019 05/23/2019 04/28/2018  Total Protein 6.1 - 8.1 g/dL 6.8 - 6.9  Albumin 3.5 - 5.0 - 4.4 4.5  AST 10 - 35 U/L _0 ALT 6 - 29 U/L 17 36(A) 18  Alk Phosphatase 25 - 125 - 78 105  Total Bilirubin 0.2 - 1.2 mg/dL 0.4 - 0.4  Bilirubin, Direct 0.0 - 0.2 mg/dL 0.1 0.09 0.1     The 10-year ASCVD risk score Mikey Bussing DC Jr., et al., 2013) is: 3.1%   Values used to calculate the score:     Age: 53 years     Sex: Female     Is Non-Hispanic African American: No     Diabetic: No     Tobacco smoker: No     Systolic Blood Pressure: 161 mmHg     Is BP treated: Yes     HDL Cholesterol: 78 mg/dL     Total Cholesterol: 174 mg/dL   Patient has failed these meds in past: simvastatin (myalgias) Patient is currently controlled on the following medications:  . Rosuvastatin 20 mg 1 tablet daily  We discussed:  diet and exercise extensively  -Lowering cholesterol through diet by: Marland Kitchen Limiting foods with cholesterol such as liver and other organ meats, egg yolks, shrimp, and whole milk dairy products . Avoiding saturated fats and trans fats and incorporating healthier fats, such as lean meat, nuts, and unsaturated oils like canola and olive oils . Eating foods with soluble fiber such as whole-grain cereals such as oatmeal and oat bran, fruits such as apples, bananas, oranges, pears, and prunes, legumes such as kidney beans, lentils, chick peas, black-eyed peas, and lima beans, and  green leafy vegetables . Limiting alcohol intake  Plan  Continue current medications    Bipolar disorder I   Patient has failed these meds in past: none Patient is currently controlled on the following medications:  . Abilify 10 mg 1 tablet daily in the morning . Clonazepam 0.5 mg 1 tablet twice daily  We discussed:  Regular follow up with psychiatry  Plan Managed by psychiatry Sharon Seller NP) every few months next appointment in December.  Continue current medications   Anxiety   Patient has failed these meds in past: none Patient is currently uncontrolled on the following medications:  . Clonazepam 0.5 mg 1 tablet twice daily . Sertraline 100 mg 2 tablets in the morning  We discussed: patient doesn't always feel controlled with anxiety - recommended to discuss with psychiatry  Plan Managed by psychiatry.  Continue current medications   Osteoporosis   Last DEXA Scan: 03/2016  T-Score femoral neck: RFN -2.3, LFN -2.5  T-Score total hip: n/a  T-Score lumbar spine: -0.8  T-Score forearm radius: n/a  10-year probability of major osteoporotic fracture: n/a  10-year probability of hip fracture: n/a  VITD  Date Value Ref Range Status  07/27/2019 50.41 30.00 - 100.00 ng/mL Final     Patient is a candidate for pharmacologic treatment due to history of vertebral fracture and T-Score < -2.5 in femoral neck  Patient  has failed these meds in past: none Patient is currently controlled on the following medications:  . Alendronate 70 mg 1 tablet every 7 days - taking Saturday  . Vitamin D3 5000 units 1 capsule daily (125 mcg)   We discussed:  Recommend 800-1000 units of vitamin D daily. Recommend 1200 mg of calcium daily from dietary and supplemental sources. Counseled on oral bisphosphonate administration: take in the morning, 30 minutes prior to food with 6-8 oz of water. Do not lie down for at least 30 minutes after taking. Recommend weight-bearing and muscle  strengthening exercises for building and maintaining bone density. - separating by 30 minutes from levothyroxine for Fosamax, then 30 minutes and then food   Plan  Continue current medications   Hypothyroidism   Lab Results  Component Value Date/Time   TSH 0.73 07/27/2019 12:06 PM   TSH 0.54 04/28/2018 11:05 AM   FREET4 0.89 09/14/2016 10:37 AM   FREET4 1.04 09/09/2015 02:15 PM    Patient has failed these meds in past: none Patient is currently controlled on the following medications:  . Levothyroxine 50 mcg 1 tablet daily in the morning  We discussed:  consistent administration of levothyroxine  Plan  Continue current medications  Tremors   Patient has failed these meds in past: none Patient is currently controlled on the following medications:  . Trihexyphenidyl 2 mg 1 tablet daily  Plan Managed by psychiatry (Brittany). Continue current medications   Migraines   Patient has failed these meds in past: other triptans (could not recall name) Patient is currently controlled on the following medications:  . Rizatriptan 10 mg ODT PRN (max 2 doses/week)  We discussed: patient reports it doesn't always cover migraines - patient has 1-2 per month  Plan Continue current medications   GERD   Patient has failed these meds in past: unknown Patient is currently uncontrolled on the following medications:  . Famotidine 20 mg 1 tablet at bedtime  We discussed:  Non-pharmacologic management of symptoms such as elevating the head of your bed, avoiding eating 2-3 hours before bed, avoiding triggering foods such as acidic, spicy, or fatty foods, eating smaller meals, and wearing clothes that are loose around the waist   Plan Recommended for patient to discuss with Dr. Teoh about increasing to twice daily. Continue current medications  Overactive bladder   Patient has failed these meds in past: none Patient is currently controlled on the following medications:   . Myrbetriq 25 mg 1 tablet daily  We discussed:  Patient reports some efficacy with current dose - recommended discussing with Dr. Wrenn about dose increase  Plan Managed by Dr. Wrenn. Continue current medications   Insomnia   Patient has failed these meds in past: none Patient is currently controlled on the following medications:  . Zolpidem 10 mg 1 tablet at bedtime PRN (taking every night)  We discussed:  Practicing good sleep hygiene by setting a sleep schedule and maintaining it, avoid excessive napping, following a nightly routine, avoiding screen time for 30-60 minutes before going to bed, and making the bedroom a cool, quiet and dark space  Plan  Continue current medications   Fluid retention   Patient has failed these meds in past: none Patient is currently uncontrolled on the following medications:  . Furosemide 20 mg 1 tablet daily as needed   We discussed: patient currently cannot afford compression stockings and reports dose is not strong enough   Plan Will discuss with PCP about increasing dose of furosemide.   Continue current medications    Pain   Patient has failed these meds in past: none Patient is currently controlled on the following medications:  . Oxycodone 5 mg 1 tablet every 4-6 hours as needed for pain . Tylenol 500 mg 1 tablet every 6 hours as needed . Methocarbamol 500 mg 1 tablet every 6 hours as needed  We discussed:  Patient sometimes takes oxycodone after OT; discussed maximum recommended daily dose of 3,000 mg of Tylenol; recommended for patient to try Voltaren gel for joint pain  Plan  Continue current medications  Vaginal atrophy   Patient has failed these meds in past: none Patient is currently controlled on the following medications:  . Estrace 0.1 mg/gm place 1 applicatorful vaginally once weekly  We discussed:  patient doesn't like inserting it so sometimes she skips it  Plan  Continue current medications    Miscellaneous   Patient is currently on the following medications:  Marland Kitchen Metronidazole 0.75% cream  . Miconazole cream  . Zofran 4 mg PRN  . Hydrocortisone 2.5% ointment uses with heat rash . Vitamin C 500 mg 1 tablet daily  . Ketoconazole 2% cream -  antifungal - try to alternate with metronidazole . Zinc gluconate 50 mg 1 tablet daily . Zinc oxide 40% apply topically   We discussed:  Avoiding using miconazole and ketoconazole as both are antifungal creams   Plan  Continue current medications   Vaccines   Reviewed and discussed patient's vaccination history.    Immunization History  Administered Date(s) Administered  . Influenza Split 02/09/2012  . Influenza Whole 12/08/2006, 12/01/2007  . Influenza,inj,Quad PF,6+ Mos 12/11/2013, 11/21/2014, 10/13/2016, 10/24/2017, 10/31/2018, 11/08/2019  . Influenza-Unspecified 12/01/2015  . Moderna SARS-COVID-2 Vaccination 04/28/2019, 05/29/2019  . Pneumococcal Polysaccharide-23 12/12/2013  . Tdap 04/16/2011, 06/25/2013  . Zoster Recombinat (Shingrix) 05/30/2018, 08/16/2018    Plan  Patient is up to date on all immunizations.   Medication Management   Pt [facility] medications Uses pill box? No - uses plastic containers and flips them when she has taken them Pt endorses 90% compliance - Alendronate (once in a while skips thyroid medicine on Sunday) - we discussed taking Thyroid medicine prior to lunch on Sundays if within a couple of hours of usual dose time  We discussed: Current pharmacy is preferred with insurance plan and patient is satisfied with pharmacy services    Plan  Continue current medication management strategy   Follow up: 3 month phone visit  Jeni Salles, PharmD Clinical Pharmacist [facility]

## 2019-12-18 ENCOUNTER — Telehealth: Payer: Medicare HMO

## 2019-12-18 DIAGNOSIS — M79642 Pain in left hand: Secondary | ICD-10-CM | POA: Diagnosis not present

## 2019-12-18 DIAGNOSIS — M19011 Primary osteoarthritis, right shoulder: Secondary | ICD-10-CM | POA: Diagnosis not present

## 2019-12-18 DIAGNOSIS — M654 Radial styloid tenosynovitis [de Quervain]: Secondary | ICD-10-CM | POA: Diagnosis not present

## 2019-12-18 DIAGNOSIS — M25642 Stiffness of left hand, not elsewhere classified: Secondary | ICD-10-CM | POA: Diagnosis not present

## 2019-12-18 DIAGNOSIS — M180 Bilateral primary osteoarthritis of first carpometacarpal joints: Secondary | ICD-10-CM | POA: Diagnosis not present

## 2019-12-18 DIAGNOSIS — Z4789 Encounter for other orthopedic aftercare: Secondary | ICD-10-CM | POA: Diagnosis not present

## 2019-12-18 DIAGNOSIS — M1812 Unilateral primary osteoarthritis of first carpometacarpal joint, left hand: Secondary | ICD-10-CM | POA: Diagnosis not present

## 2019-12-30 NOTE — Patient Instructions (Addendum)
Hi [name],  It was lovely getting to meet you over the phone! Below is a summary of some of the things we discussed.  Additionally, I just wanted to remind you to check your blood pressure at home and bring your cuff to your next in person visit to figure out how to use it.   Please give me a call if you need anything or have questions prior to our follow up telephone call!  Best, Maddie  Jeni Salles, PharmD Clinical Pharmacist [facility]   Visit Information  Goals Addressed            This Visit's Progress   . Pharmacy Care Plan       CARE PLAN ENTRY (see longitudinal plan of care for additional care plan information)  Current Barriers:  . Chronic Disease Management support, education, and care coordination needs related to Hypertension, Hyperlipidemia, GERD, Asthma, Hypothyroidism, Anxiety, Osteoporosis, Overactive Bladder, and fluid retention   Hypertension BP Readings from Last 3 Encounters:  11/27/19 104/60  11/08/19 118/70  09/11/19 120/78   . Pharmacist Clinical Goal(s): o Over the next 90 days, patient will work with PharmD and providers to maintain BP goal <140/90 . Current regimen:   Metoprolol tartrate 25 mg 1 tablet twice daily . Interventions: o Discussed DASH eating plan recommendations: . Emphasizes vegetables, fruits, and whole-grains . Includes fat-free or low-fat dairy products, fish, poultry, beans, nuts, and vegetable oils . Limits foods that are high in saturated fat. These foods include fatty meats, full-fat dairy products, and tropical oils such as coconut, palm kernel, and palm oils. . Limits sugar-sweetened beverages and sweets . Limiting sodium intake to < 1500 mg/day o Discussed recommendations for moderate aerobic exercise for 150 minutes/week spread out over 5 days for heart healthy lifestyle o Discussed the importance of checking blood pressure at home . Patient self care activities - Over the next  90 days, patient will: o Check blood pressure weekly, document, and provide at future appointments o Ensure daily salt intake < 2300 mg/day o Bring BP cuff to next in office visit to help with directions for use  Hyperlipidemia Lab Results  Component Value Date/Time   LDLCALC 79 09/11/2019 12:01 PM   LDLDIRECT 143.0 09/14/2016 10:37 AM   . Pharmacist Clinical Goal(s): o Over the next 90 days, patient will work with PharmD and providers to maintain LDL goal < 100 . Current regimen:  o Rosuvastatin 20 mg 1 tablet daily . Interventions: o Discussed lowering cholesterol through diet by: Marland Kitchen Limiting foods with cholesterol such as liver and other organ meats, egg yolks, shrimp, and whole milk dairy products . Avoiding saturated fats and trans fats and incorporating healthier fats, such as lean meat, nuts, and unsaturated oils like canola and olive oils . Eating foods with soluble fiber such as whole-grain cereals such as oatmeal and oat bran, fruits such as apples, bananas, oranges, pears, and prunes, legumes such as kidney beans, lentils, chick peas, black-eyed peas, and lima beans, and green leafy vegetables . Limiting alcohol intake . Patient self care activities - Over the next 90 days, patient will: o Continue current medications  Asthma . Pharmacist Clinical Goal(s): o Over the next 90 days, patient will work with PharmD and providers to control symptoms of asthma . Current regimen:  . Albuterol nebulizer 0.083% 3 mLs by nebulization PRN (only used a few times since shes had it) . Ventolin HFA 1-2 puffs as needed for wheezing or shortness of breath .  Flovent HFA 220 mcg/act 1-2 puffs in the morning and at bedtime . Montelukast 10 mg 1 tablet every day at bedtime . Interventions: o Discuss proper inhaler technique and rinsing mouth out with each use of Flovent . Patient self care activities - Over the next 90 days, patient will: o Continue current  medications  Hypothyroidism . Pharmacist Clinical Goal(s) o Over the next 90 days, patient will work with PharmD and providers to maintain TSH between 0.35-4.50 . Current regimen:  o Levothyroxine 50 mcg 1 tablet daily in the morning . Interventions: o Discussed taking levothyroxine 30 minutes after alendronate and 30 minutes before food o Recommended taking prior to lunch on Sundays if within a couple of hours of typical administration time . Patient self care activities - Over the next 90 days, patient will: o Continue current medications o Establish a routine with taking levothyroxine consistently and without missing doses  Anxiety . Pharmacist Clinical Goal(s) o Over the next 90 days, patient will work with PharmD and providers to manage symptoms of anxiety . Current regimen:  . Clonazepam 0.5 mg 1 tablet twice daily . Sertraline 100 mg 2 tablets in the morning . Interventions: o Discussed the possibility of switching to other medications for anxiety . Patient self care activities - Over the next 90 days, patient will: o Discuss with [place] about changing medications for anxiety  Osteoporosis . Pharmacist Clinical Goal(s) o Over the next 90 days, patient will work with PharmD and providers to improve bone density and prevent fractures . Current regimen:  . Alendronate 70 mg 1 tablet every 7 days . Vitamin D3 5000 units 1 capsule daily . Interventions: o Discussed recommended [phone] units of vitamin D daily and 1200 mg of calcium daily from dietary and supplemental sources.  o Counseled on oral bisphosphonate administration: take in the morning, 30 minutes prior to food with 6-8 oz of water. Do not lie down for at least 30 minutes after taking.  o Recommended weight-bearing and muscle strengthening exercises for building and maintaining bone density. . Patient self care activities - Over the next 90 days, patient will: o Continue current medications o Ensure  adequate supplementation with calcium  GERD . Pharmacist Clinical Goal(s) o Over the next 90 days, patient will work with PharmD and providers to minimize symptoms of heartburn . Current regimen:  o Famotidine 20 mg 1 tablet at bedtime . Interventions: o Discussed the option of increasing dose of famotidine . Patient self care activities - Over the next 90 days, patient will: o Reach out to Dr. Benjamine Mola about increasing dose or frequency of famotidine  Overactive bladder . Pharmacist Clinical Goal(s) o Over the next 90 days, patient will work with PharmD and providers to manage symptoms of overactive bladder . Current regimen:  o Myrbetriq 25 mg 1 tablet daily . Interventions: o Discussed the possibility of increasing dose of Myrbetriq for additional bladder control . Patient self care activities - Over the next 90 days, patient will: o Discuss with Dr. Jeffie Pollock about increasing dose of Myrbetriq  Fluid retention . Pharmacist Clinical Goal(s) o Over the next 90 days, patient will work with PharmD and providers to improve fluid retention . Current regimen:  o Furosemide 20 mg 1 tablet daily as needed  . Interventions: o Discussed the possibility of increasing the dose of furosemide for additional fluid retention . Patient self care activities - Over the next 90 days, patient will: o Continue current medications  Medication management . Pharmacist  Clinical Goal(s): o Over the next 90 days, patient will work with PharmD and providers to maintain optimal medication adherence . [facility] . Interventions o Comprehensive medication review performed. o Continue current medication management strategy . Patient self care activities - Over the next 90 days, patient will: o Focus on medication adherence by taking levothyroxine prior to lunch on days that she cannot take it in the morning o Take medications as prescribed o Report any questions or concerns to PharmD and/or  provider(s)  Initial goal documentation        Ms. Wallner was given information about Chronic Care Management services today including:  1. CCM service includes personalized support from designated clinical staff supervised by her physician, including individualized plan of care and coordination with other care providers 2. 24/7 contact phone numbers for assistance for urgent and routine care needs. 3. Standard insurance, coinsurance, copays and deductibles apply for chronic care management only during months in which we provide at least 20 minutes of these services. Most insurances cover these services at 100%, however patients may be responsible for any copay, coinsurance and/or deductible if applicable. This service may help you avoid the need for more expensive face-to-face services. 4. Only one practitioner may furnish and bill the service in a calendar month. 5. The patient may stop CCM services at any time (effective at the end of the month) by phone call to the office staff.  Patient agreed to services and verbal consent obtained.   The patient verbalized understanding of instructions, educational materials, and care plan provided today and agreed to receive a mailed copy of patient instructions, educational materials, and care plan.  Telephone follow up appointment with pharmacy team member scheduled for: 90 days

## 2020-01-05 ENCOUNTER — Other Ambulatory Visit: Payer: Self-pay | Admitting: Family Medicine

## 2020-01-05 DIAGNOSIS — G43809 Other migraine, not intractable, without status migrainosus: Secondary | ICD-10-CM

## 2020-01-05 DIAGNOSIS — G4733 Obstructive sleep apnea (adult) (pediatric): Secondary | ICD-10-CM | POA: Diagnosis not present

## 2020-01-09 DIAGNOSIS — R69 Illness, unspecified: Secondary | ICD-10-CM | POA: Diagnosis not present

## 2020-01-13 ENCOUNTER — Other Ambulatory Visit: Payer: Self-pay | Admitting: Allergy and Immunology

## 2020-01-17 ENCOUNTER — Other Ambulatory Visit: Payer: Self-pay | Admitting: Allergy and Immunology

## 2020-01-22 DIAGNOSIS — R69 Illness, unspecified: Secondary | ICD-10-CM | POA: Diagnosis not present

## 2020-01-22 DIAGNOSIS — F410 Panic disorder [episodic paroxysmal anxiety] without agoraphobia: Secondary | ICD-10-CM | POA: Diagnosis not present

## 2020-01-22 DIAGNOSIS — Z79891 Long term (current) use of opiate analgesic: Secondary | ICD-10-CM | POA: Diagnosis not present

## 2020-01-22 DIAGNOSIS — F411 Generalized anxiety disorder: Secondary | ICD-10-CM | POA: Diagnosis not present

## 2020-02-04 DIAGNOSIS — G4733 Obstructive sleep apnea (adult) (pediatric): Secondary | ICD-10-CM | POA: Diagnosis not present

## 2020-02-05 ENCOUNTER — Telehealth: Payer: Self-pay | Admitting: Family Medicine

## 2020-02-05 NOTE — Telephone Encounter (Signed)
It is ok to transfer care. Thanks, BJ

## 2020-02-05 NOTE — Telephone Encounter (Signed)
   Patient requesting [name] appointment with Dr Yetta Barre Patient states the location is better for her

## 2020-02-06 NOTE — Telephone Encounter (Signed)
Yes. I will see her.  TJ 

## 2020-02-09 ENCOUNTER — Other Ambulatory Visit: Payer: Self-pay | Admitting: Family Medicine

## 2020-02-09 DIAGNOSIS — M810 Age-related osteoporosis without current pathological fracture: Secondary | ICD-10-CM

## 2020-02-13 DIAGNOSIS — R69 Illness, unspecified: Secondary | ICD-10-CM | POA: Diagnosis not present

## 2020-02-18 DIAGNOSIS — K219 Gastro-esophageal reflux disease without esophagitis: Secondary | ICD-10-CM | POA: Diagnosis not present

## 2020-02-18 DIAGNOSIS — R490 Dysphonia: Secondary | ICD-10-CM | POA: Diagnosis not present

## 2020-02-21 DIAGNOSIS — F410 Panic disorder [episodic paroxysmal anxiety] without agoraphobia: Secondary | ICD-10-CM | POA: Diagnosis not present

## 2020-02-21 DIAGNOSIS — F411 Generalized anxiety disorder: Secondary | ICD-10-CM | POA: Diagnosis not present

## 2020-02-21 DIAGNOSIS — R69 Illness, unspecified: Secondary | ICD-10-CM | POA: Diagnosis not present

## 2020-02-28 ENCOUNTER — Encounter: Payer: Medicare HMO | Admitting: Internal Medicine

## 2020-03-05 DIAGNOSIS — R3915 Urgency of urination: Secondary | ICD-10-CM | POA: Diagnosis not present

## 2020-03-05 DIAGNOSIS — N3941 Urge incontinence: Secondary | ICD-10-CM | POA: Diagnosis not present

## 2020-03-05 DIAGNOSIS — N3010 Interstitial cystitis (chronic) without hematuria: Secondary | ICD-10-CM | POA: Diagnosis not present

## 2020-03-06 DIAGNOSIS — G4733 Obstructive sleep apnea (adult) (pediatric): Secondary | ICD-10-CM | POA: Diagnosis not present

## 2020-03-12 ENCOUNTER — Encounter: Payer: Self-pay | Admitting: Internal Medicine

## 2020-03-12 ENCOUNTER — Other Ambulatory Visit: Payer: Self-pay

## 2020-03-12 ENCOUNTER — Ambulatory Visit (INDEPENDENT_AMBULATORY_CARE_PROVIDER_SITE_OTHER): Payer: Medicare HMO | Admitting: Internal Medicine

## 2020-03-12 VITALS — BP 128/86 | HR 83 | Temp 98.8°F | Ht 60.0 in | Wt 193.0 lb

## 2020-03-12 DIAGNOSIS — I10 Essential (primary) hypertension: Secondary | ICD-10-CM | POA: Diagnosis not present

## 2020-03-12 DIAGNOSIS — R600 Localized edema: Secondary | ICD-10-CM | POA: Diagnosis not present

## 2020-03-12 DIAGNOSIS — E119 Type 2 diabetes mellitus without complications: Secondary | ICD-10-CM

## 2020-03-12 DIAGNOSIS — E039 Hypothyroidism, unspecified: Secondary | ICD-10-CM | POA: Diagnosis not present

## 2020-03-12 DIAGNOSIS — I5189 Other ill-defined heart diseases: Secondary | ICD-10-CM | POA: Diagnosis not present

## 2020-03-12 DIAGNOSIS — M170 Bilateral primary osteoarthritis of knee: Secondary | ICD-10-CM | POA: Diagnosis not present

## 2020-03-12 LAB — URINALYSIS, ROUTINE W REFLEX MICROSCOPIC
Bilirubin Urine: NEGATIVE
Hgb urine dipstick: NEGATIVE
Ketones, ur: NEGATIVE
Nitrite: NEGATIVE
Specific Gravity, Urine: 1.02 (ref 1.000–1.030)
Total Protein, Urine: NEGATIVE
Urine Glucose: NEGATIVE
Urobilinogen, UA: 0.2 (ref 0.0–1.0)
pH: 7 (ref 5.0–8.0)

## 2020-03-12 LAB — BASIC METABOLIC PANEL
BUN: 11 mg/dL (ref 6–23)
CO2: 31 mEq/L (ref 19–32)
Calcium: 9.6 mg/dL (ref 8.4–10.5)
Chloride: 104 mEq/L (ref 96–112)
Creatinine, Ser: 0.85 mg/dL (ref 0.40–1.20)
GFR: 72.96 mL/min (ref >60.00)
Glucose, Bld: 120 mg/dL — ABNORMAL HIGH (ref 70–99)
Potassium: 3.9 mEq/L (ref 3.5–5.1)
Sodium: 141 mEq/L (ref 135–145)

## 2020-03-12 LAB — TSH: TSH: 1.69 u[IU]/mL (ref 0.35–4.50)

## 2020-03-12 LAB — BRAIN NATRIURETIC PEPTIDE: Pro B Natriuretic peptide (BNP): 46 pg/mL (ref 0.0–100.0)

## 2020-03-12 LAB — MICROALBUMIN / CREATININE URINE RATIO
Creatinine,U: 124.5 mg/dL
Microalb Creat Ratio: 0.7 mg/g (ref 0.0–30.0)
Microalb, Ur: 0.9 mg/dL (ref 0.0–1.9)

## 2020-03-12 LAB — HEMOGLOBIN A1C: Hgb A1c MFr Bld: 6.5 % (ref 4.6–6.5)

## 2020-03-12 MED ORDER — TORSEMIDE 20 MG PO TABS: 20.0000 mg | ORAL_TABLET | Freq: Every day | ORAL | 1 refills | Status: DC

## 2020-03-12 NOTE — Patient Instructions (Signed)
Heart Failure, Diagnosis  Heart failure is a condition in which the heart has trouble pumping blood. This may mean that the heart cannot pump enough blood out to the body or that the heart does not fill up with enough blood. For some people with heart failure, fluid may back up into the lungs. There may also be swelling (edema) in the lower legs. Heart failure is usually a long-term (chronic) condition. It is important for you to take good care of yourself and follow the treatment plan from your health care provider. What are the causes? This condition may be caused by:  High blood pressure (hypertension). Hypertension causes the heart muscle to work harder than normal.  Coronary artery disease, or CAD. CAD is the buildup of cholesterol and fat (plaque) in the arteries of the heart.  Heart attack, also called myocardial infarction. This injures the heart muscle, making it hard for the heart to pump blood.  Abnormal heart valves. The valves do not open and close properly, forcing the heart to pump harder to keep the blood flowing.  Heart muscle disease, inflammation, or infection (cardiomyopathy or myocarditis). This is damage to the heart muscle. It can increase the risk of heart failure.  Lung disease. The heart works harder when the lungs are not healthy. What increases the risk? The risk of heart failure increases as a person ages. This condition is also more likely to develop in people who:  Are obese.  Are female.  Use tobacco or nicotine products.  Abuse alcohol or drugs.  Have taken medicines that can damage the heart, such as chemotherapy drugs.  Have any of these conditions: ? Diabetes. ? Abnormal heart rhythms. ? Thyroid problems. ? Low blood counts (anemia). ? Chronic kidney disease.  Have a family history of heart failure. What are the signs or symptoms? Symptoms of this condition include:  Shortness of breath with activity, such as when climbing stairs.  A cough  that does not go away.  Swelling of the feet, ankles, legs, or abdomen.  Losing or gaining weight for no reason.  Trouble breathing when lying flat.  Waking from sleep because of the need to sit up and get more air.  Rapid heartbeat.  Tiredness (fatigue) and loss of energy.  Feeling light-headed, dizzy, or close to fainting.  Nausea or loss of appetite.  Waking up more often during the night to urinate (nocturia).  Confusion. How is this diagnosed? This condition is diagnosed based on:  Your medical history, symptoms, and a physical exam.  Diagnostic tests, which may include: ? Echocardiogram. ? Electrocardiogram (ECG). ? Chest X-ray. ? Blood tests. ? Exercise stress test. ? Cardiac MRI. ? Cardiac catheterization and angiogram. ? Radionuclide scans. How is this treated? Treatment for this condition is aimed at managing the symptoms of heart failure. Medicines Treatment may include medicines that:  Help lower blood pressure by relaxing (dilating) the blood vessels. These medicines are called ACE inhibitors (angiotensin-converting enzyme), ARBs (angiotensin receptor blockers), or vasodilators.  Cause the kidneys to remove salt and water from the blood through urination (diuretics).  Improve heart muscle strength and prevent the heart from beating too fast (beta blockers).  Increase the force of the heartbeat (digoxin).  Lower heart rates. Certain diabetes medicines (SGLT-2 inhibitors) may also be used in treatment. Healthy behavior changes Treatment may also include making healthy lifestyle changes, such as:  Reaching and staying at a healthy weight.  Not using tobacco or nicotine products.  Eating heart-healthy foods.    Limiting or avoiding alcohol.  Stopping the use of illegal drugs.  Being physically active.  Participating in a cardiac rehabilitation program, which is a treatment program to improve your health and well-being through exercise training,  education, and counseling. Other treatments Other treatments may include:  Procedures to open blocked arteries or repair damaged valves.  Placing a pacemaker to improve heart function (cardiac resynchronization therapy).  Placing a device to treat serious abnormal heart rhythms (implantable cardioverter defibrillator, or ICD).  Placing a device to improve the pumping ability of the heart (left ventricular assist device, or LVAD).  Receiving a healthy heart from a donor (heart transplant). This is done when other treatments have not helped. Follow these instructions at home:  Manage other health conditions as told by your health care provider. These may include hypertension, diabetes, thyroid disease, or abnormal heart rhythms.  Get ongoing education and support as needed. Learn as much as you can about heart failure.  Keep all follow-up visits. This is important. Summary  Heart failure is a condition in which the heart has trouble pumping blood.  This condition is commonly caused by high blood pressure and other diseases of the heart and lungs.  Symptoms of this condition include shortness of breath, tiredness (fatigue), nausea, and swelling of the feet, ankles, legs, or abdomen.  Treatments for this condition may include medicines, lifestyle changes, and surgery.  Manage other health conditions as told by your health care provider. This information is not intended to replace advice given to you by your health care provider. Make sure you discuss any questions you have with your health care provider. Document Revised: 08/18/2019 Document Reviewed: 08/18/2019 [employer] Patient Education  2021 [employer]  

## 2020-03-12 NOTE — Progress Notes (Signed)
Subjective:  Patient ID: [name], female    DOB: 1956-01-02  Age: 65 y.o. MRN: [id number]  CC: Diabetes, Hypertension, and Congestive Heart Failure  This visit occurred during the SARS-CoV-2 public health emergency.  Safety protocols were in place, including screening questions prior to the visit, additional usage of staff PPE, and extensive cleaning of exam room while observing appropriate contact time as indicated for disinfecting solutions.    HPI [name] presents for establishing.  She complains of a several month history of weight gain and lower extremity edema.  She had an echocardiogram done about 3 years ago that showed grade 1 diastolic dysfunction.  She is not getting much symptom relief with furosemide.  She complains of DOE but denies chest pain, diaphoresis, dizziness, lightheadedness, or palpitations.  Outpatient Medications Prior to Visit  Medication Sig Dispense Refill  . acetaminophen (TYLENOL) 500 MG tablet Take 500 mg by mouth every 6 (six) hours as needed.    Marland Kitchen albuterol (PROVENTIL) (2.5 MG/3ML) 0.083% nebulizer solution TAKE 3 MLS BY NEBULIZATION EVERY 6 HOURS AS NEEDED FOR WHEEZING OR SHORTNESS OF BREATH. 150 mL 1  . albuterol (VENTOLIN HFA) 108 (90 Base) MCG/ACT inhaler Inhale 1-2 puffs into the lungs daily as needed for wheezing or shortness of breath. 18 g 1  . alendronate (FOSAMAX) 70 MG tablet TAKE 1 TABLET BY MOUTH EVERY 7 DAYS WITH FULL GLASS OF WATER ON EMPTY STOMACH 12 tablet 0  . ARIPiprazole (ABILIFY) 10 MG tablet Take 10 mg by mouth every morning.    . Ascorbic Acid (VITAMIN C CR) 500 MG CPCR Take by mouth.    . Azelastine HCl 0.15 % SOLN PLACE 1-2 SPRAYS INTO BOTH NOSTRILS 2 (TWO) TIMES DAILY. 30 mL 5  . Brexpiprazole (REXULTI) 3 MG TABS Take by mouth.    . Carbinoxamine Maleate 4 MG TABS Take 1 tablet (4 mg total) by mouth every 8 (eight) hours as needed. 180 tablet 1  . clonazePAM (KLONOPIN) 0.5 MG tablet 0.5 mg 2 (two) times  daily as needed.     . [facility] D3 125 MCG (5000 UT) capsule TAKE 1 CAPSULE (5,000 UNITS TOTAL) BY MOUTH DAILY. 90 capsule 3  . estradiol (ESTRACE) 0.1 MG/GM vaginal cream PLACE 1 APPLICATORFUL VAGINALLY ONCE A WEEK. 42.5 g 4  . famotidine (PEPCID) 20 MG tablet Take 20 mg by mouth at bedtime.    . fluticasone (FLONASE) 50 MCG/ACT nasal spray PLACE 1 SPRAY INTO BOTH NOSTRILS 2 (TWO) TIMES DAILY AS NEEDED FOR ALLERGIES OR RHINITIS. 48 mL 0  . fluticasone (FLOVENT HFA) 220 MCG/ACT inhaler Inhale 1-2 puffs into the lungs in the morning and at bedtime. 36 each 2  . hydrocortisone 2.5 % ointment Apply topically 2 (two) times daily.    Marland Kitchen ketoconazole (NIZORAL) 2 % cream Apply topically daily.     Marland Kitchen levothyroxine (SYNTHROID) 50 MCG tablet TAKE 1 TABLET EVERY DAY IN THE MORNING 90 tablet 3  . methocarbamol (ROBAXIN) 500 MG tablet Take 500 mg by mouth every 6 (six) hours as needed.    . metoprolol tartrate (LOPRESSOR) 25 MG tablet TAKE 1 TABLET BY MOUTH TWICE A DAY 180 tablet 1  . metroNIDAZOLE (METROCREAM) 0.75 % cream Apply 1 application topically 2 (two) times daily.    . montelukast (SINGULAIR) 10 MG tablet TAKE 1 TABLET BY MOUTH EVERYDAY AT BEDTIME 90 tablet 1  . MYRBETRIQ 25 MG TB24 tablet Take 25 mg by mouth daily.    . Olopatadine HCl (PATADAY)  0.2 % SOLN Place 1 drop into both eyes daily as needed. 2.5 mL 5  . ondansetron (ZOFRAN) 4 MG tablet Take 1 tablet (4 mg total) by mouth 2 (two) times daily as needed for nausea or vomiting. 15 tablet 0  . rizatriptan (MAXALT-MLT) 10 MG disintegrating tablet TAKE 1 TABLET EVERY DAY AS NEEDED FOR MIGRAINE, MAY REPEAT IN 2 HRS IF NEEDED. MAX 2 DOSES/WK 9 tablet 2  . rosuvastatin (CRESTOR) 20 MG tablet TAKE 1 TABLET BY MOUTH EVERY DAY 90 tablet 2  . sertraline (ZOLOFT) 100 MG tablet Take 200 mg by mouth at bedtime.     . sodium chloride (OCEAN) 0.65 % SOLN nasal spray Place 1 spray into both nostrils as needed for congestion.    . trihexyphenidyl (ARTANE) 2 MG  tablet Take 2 mg by mouth daily.    Marland Kitchen zinc gluconate 50 MG tablet Take 50 mg by mouth daily.    . Zinc Oxide 40 % PSTE Apply topically.    Marland Kitchen zolpidem (AMBIEN) 10 MG tablet Take 10 mg by mouth at bedtime.    . furosemide (LASIX) 20 MG tablet TAKE 1 TABLET (20 MG TOTAL) BY MOUTH DAILY AS NEEDED FOR EDEMA. 30 tablet 11  . oxyCODONE (OXY IR/ROXICODONE) 5 MG immediate release tablet oxycodone 5 mg tablet  1 po q4-6 hrs prn pain     Facility-Administered Medications Prior to Visit  Medication Dose Route Frequency Provider Last Rate Last Admin  . predniSONE (DELTASONE) tablet 10 mg  10 mg Oral Q breakfast Bobbitt, [place] Muta, MD        ROS Review of Systems  Constitutional: Positive for unexpected weight change. Negative for appetite change, chills, diaphoresis and fatigue.  HENT: Negative.   Eyes: Negative.   Respiratory: Positive for shortness of breath. Negative for cough, chest tightness and wheezing.   Cardiovascular: Positive for leg swelling. Negative for chest pain and palpitations.  Gastrointestinal: Negative for abdominal pain, constipation, diarrhea, nausea and vomiting.  Endocrine: Negative.  Negative for cold intolerance, heat intolerance, polydipsia, polyphagia and polyuria.  Genitourinary: Negative.  Negative for difficulty urinating, frequency and hematuria.  Musculoskeletal: Negative.  Negative for arthralgias, back pain and myalgias.  Skin: Negative.  Negative for color change and pallor.  Neurological: Negative.  Negative for dizziness, weakness, light-headedness and numbness.  Hematological: Negative for adenopathy. Does not bruise/bleed easily.  Psychiatric/Behavioral: Negative.     Objective:  BP 128/86   Pulse 83   Temp 98.8 F (37.1 C) (Oral)   Ht 5' (1.524 m)   Wt 193 lb (87.5 kg)   LMP 03/05/2012 (Approximate)   SpO2 97%   BMI 37.69 kg/m   BP Readings from Last 3 Encounters:  03/12/20 128/86  11/27/19 104/60  11/08/19 118/70    Wt Readings from  Last 3 Encounters:  03/12/20 193 lb (87.5 kg)  11/08/19 178 lb 2 oz (80.8 kg)  09/11/19 176 lb 4 oz (79.9 kg)    Physical Exam Vitals reviewed.  Constitutional:      Appearance: She is obese.  HENT:     Nose: Nose normal.     Mouth/Throat:     Mouth: Mucous membranes are moist.  Eyes:     General: No scleral icterus.    Conjunctiva/sclera: Conjunctivae normal.  Cardiovascular:     Rate and Rhythm: Normal rate and regular rhythm.     Heart sounds: Normal heart sounds, S1 normal and S2 normal. No friction rub. No gallop.      Comments:  EKG- NSR, 81 bpm Normal EKG Pulmonary:     Effort: Pulmonary effort is normal.     Breath sounds: No stridor. No wheezing, rhonchi or rales.  Abdominal:     General: Abdomen is protuberant. Bowel sounds are normal. There is no distension.     Palpations: Abdomen is soft. There is no hepatomegaly, splenomegaly or mass.     Tenderness: There is no abdominal tenderness.  Musculoskeletal:     Cervical back: Neck supple.     Right lower leg: 1+ Pitting Edema present.     Left lower leg: 1+ Pitting Edema present.  Lymphadenopathy:     Cervical: No cervical adenopathy.  Skin:    General: Skin is warm and dry.     Coloration: Skin is not pale.  Neurological:     General: No focal deficit present.     Mental Status: She is alert and oriented to person, place, and time. Mental status is at baseline.  Psychiatric:        Mood and Affect: Mood normal.        Behavior: Behavior normal.     Lab Results  Component Value Date   WBC 13.4 05/23/2019   HGB 12.1 05/23/2019   HCT 39 05/23/2019   PLT 279 05/23/2019   GLUCOSE 120 (H) 03/12/2020   CHOL 174 09/11/2019   TRIG 89 09/11/2019   HDL 78 09/11/2019   LDLDIRECT 143.0 09/14/2016   LDLCALC 79 09/11/2019   ALT 17 09/11/2019   AST 22 09/11/2019   NA 141 03/12/2020   K 3.9 03/12/2020   CL 104 03/12/2020   CREATININE 0.85 03/12/2020   BUN 11 03/12/2020   CO2 31 03/12/2020   TSH 1.69  03/12/2020   INR 0.94 12/21/2017   HGBA1C 6.5 03/12/2020   MICROALBUR 0.9 03/12/2020    US BREAST LTD UNI LEFT INC AXILLA  Result Date: 10/18/2019 CLINICAL DATA:  65 year old female presenting with focal tenderness in the left breast at 3 o'clock for approximately three months. Patient is also due for annual exam. EXAM: DIGITAL DIAGNOSTIC BILATERAL MAMMOGRAM WITH TOMO ULTRASOUND LEFT BREAST COMPARISON:  Previous exam(s). ACR Breast Density Category c: The breast tissue is heterogeneously dense, which may obscure small masses. FINDINGS: Mammogram: Right breast: No suspicious mass, distortion, or microcalcifications are identified to suggest presence of malignancy. Left breast: A skin BB marks the site of tenderness reported by the patient in the outer left breast. Spot compression tomosynthesis were performed in addition to standard views. There is no new abnormality identified at the site of concern or elsewhere in the left breast. On physical exam, I do not palpate a discrete mass at the site of concern reported by the patient in the outer left breast. There are no overt skin changes. Ultrasound: Targeted ultrasound is performed in the left breast at 2-4 o'clock at the site of pain reported by the patient demonstrating no suspicious cystic or solid mass or other finding to explain the patient's pain. IMPRESSION: No mammographic or sonographic evidence of malignancy at the site of pain reported by the patient in the left breast. No mammographic evidence of malignancy in the right breast. RECOMMENDATION: 1. Clinical follow-up as needed for the left breast pain. We discussed some of the issues regarding breast pain, including limiting caffeine, wearing adequate support, over-the-counter pain medication, low-fat diet, exercise, and ice as needed. 2.  Screening mammogram in one year.(Code:SM-B-01Y) I have discussed the findings and recommendations with the patient. If applicable, a reminder letter  will be sent to  the patient regarding the next appointment. BI-RADS CATEGORY  1: Negative. Electronically Signed   By: Audie Pinto M.D.   On: 10/18/2019 12:44   MM DIAG BREAST TOMO BILATERAL  Result Date: 10/18/2019 CLINICAL DATA:  65 year old female presenting with focal tenderness in the left breast at 3 o'clock for approximately three months. Patient is also due for annual exam. EXAM: DIGITAL DIAGNOSTIC BILATERAL MAMMOGRAM WITH TOMO ULTRASOUND LEFT BREAST COMPARISON:  Previous exam(s). ACR Breast Density Category c: The breast tissue is heterogeneously dense, which may obscure small masses. FINDINGS: Mammogram: Right breast: No suspicious mass, distortion, or microcalcifications are identified to suggest presence of malignancy. Left breast: A skin BB marks the site of tenderness reported by the patient in the outer left breast. Spot compression tomosynthesis were performed in addition to standard views. There is no new abnormality identified at the site of concern or elsewhere in the left breast. On physical exam, I do not palpate a discrete mass at the site of concern reported by the patient in the outer left breast. There are no overt skin changes. Ultrasound: Targeted ultrasound is performed in the left breast at 2-4 o'clock at the site of pain reported by the patient demonstrating no suspicious cystic or solid mass or other finding to explain the patient's pain. IMPRESSION: No mammographic or sonographic evidence of malignancy at the site of pain reported by the patient in the left breast. No mammographic evidence of malignancy in the right breast. RECOMMENDATION: 1. Clinical follow-up as needed for the left breast pain. We discussed some of the issues regarding breast pain, including limiting caffeine, wearing adequate support, over-the-counter pain medication, low-fat diet, exercise, and ice as needed. 2.  Screening mammogram in one year.(Code:SM-B-01Y) I have discussed the findings and recommendations with the  patient. If applicable, a reminder letter will be sent to the patient regarding the next appointment. BI-RADS CATEGORY  1: Negative. Electronically Signed   By: Audie Pinto M.D.   On: 10/18/2019 12:44    Assessment & Plan:   Evon was seen today for diabetes, hypertension and congestive heart failure.  Diagnoses and all orders for this visit:  Bilateral leg edema- Labs to screen for secondary causes are reassuring and normal.  This is likely related to diastolic dysfunction, obesity, and poor conditioning.  Will switch to torsemide which has better bioavailability. -     Brain natriuretic peptide; Future -     Brain natriuretic peptide -     EKG [id number]  Grade I diastolic dysfunction- See above -     torsemide (DEMADEX) 20 MG tablet; Take 1 tablet (20 mg total) by mouth daily. -     Brain natriuretic peptide; Future -     Brain natriuretic peptide  Essential hypertension- See above -     torsemide (DEMADEX) 20 MG tablet; Take 1 tablet (20 mg total) by mouth daily. -     Basic metabolic panel; Future -     Urinalysis, Routine w reflex microscopic; Future -     Urinalysis, Routine w reflex microscopic -     Basic metabolic panel  Hypothyroidism, unspecified type- Her TSH is in the normal range.  She will stay on the current dose of levothyroxine. -     TSH; Future -     TSH  Type 2 diabetes mellitus without complication, without long-term current use of insulin ([place])- Her A1c is at 6.5%.  Medical therapy is not indicated yet. -  Hemoglobin A1c; Future -     Microalbumin / creatinine urine ratio; Future -     Microalbumin / creatinine urine ratio -     Hemoglobin A1c   I have discontinued Makari B. Nemmers's furosemide and oxyCODONE. I am also having her start on torsemide. Additionally, I am having her maintain her sertraline, clonazePAM, albuterol, famotidine, ketoconazole, Myrbetriq, ARIPiprazole, ondansetron, acetaminophen, metroNIDAZOLE, sodium chloride, zinc gluconate,  trihexyphenidyl, rosuvastatin, levothyroxine, Olopatadine HCl, CVS D3, zolpidem, estradiol, Azelastine HCl, montelukast, methocarbamol, hydrocortisone, Zinc Oxide, Vitamin C CR, Flovent HFA, Carbinoxamine Maleate, albuterol, metoprolol tartrate, rizatriptan, fluticasone, alendronate, and Rexulti. We will continue to administer predniSONE.  Meds ordered this encounter  Medications  . torsemide (DEMADEX) 20 MG tablet    Sig: Take 1 tablet (20 mg total) by mouth daily.    Dispense:  90 tablet    Refill:  1     Follow-up: Return in about 3 months (around 06/09/2020).  Diane Calico, MD

## 2020-03-13 ENCOUNTER — Encounter: Payer: Self-pay | Admitting: Internal Medicine

## 2020-03-13 DIAGNOSIS — R69 Illness, unspecified: Secondary | ICD-10-CM | POA: Diagnosis not present

## 2020-03-14 ENCOUNTER — Ambulatory Visit: Payer: Medicare HMO | Admitting: Family Medicine

## 2020-03-21 ENCOUNTER — Telehealth: Payer: Self-pay | Admitting: Internal Medicine

## 2020-03-21 NOTE — Telephone Encounter (Signed)
Pt has been informed of results and expressed understanding.  °

## 2020-03-21 NOTE — Telephone Encounter (Signed)
Patient requesting a call back to go over her lab work

## 2020-03-25 DIAGNOSIS — F410 Panic disorder [episodic paroxysmal anxiety] without agoraphobia: Secondary | ICD-10-CM | POA: Diagnosis not present

## 2020-03-25 DIAGNOSIS — F411 Generalized anxiety disorder: Secondary | ICD-10-CM | POA: Diagnosis not present

## 2020-03-25 DIAGNOSIS — R69 Illness, unspecified: Secondary | ICD-10-CM | POA: Diagnosis not present

## 2020-04-01 ENCOUNTER — Telehealth: Payer: Medicare HMO

## 2020-04-01 DIAGNOSIS — M654 Radial styloid tenosynovitis [de Quervain]: Secondary | ICD-10-CM | POA: Diagnosis not present

## 2020-04-01 DIAGNOSIS — M180 Bilateral primary osteoarthritis of first carpometacarpal joints: Secondary | ICD-10-CM | POA: Diagnosis not present

## 2020-04-01 DIAGNOSIS — M1812 Unilateral primary osteoarthritis of first carpometacarpal joint, left hand: Secondary | ICD-10-CM | POA: Diagnosis not present

## 2020-04-01 DIAGNOSIS — M19011 Primary osteoarthritis, right shoulder: Secondary | ICD-10-CM | POA: Diagnosis not present

## 2020-04-02 ENCOUNTER — Other Ambulatory Visit: Payer: Self-pay

## 2020-04-02 ENCOUNTER — Other Ambulatory Visit: Payer: Self-pay | Admitting: Allergy & Immunology

## 2020-04-02 MED ORDER — AZELASTINE HCL 0.15 % NA SOLN: 1.0000 | Freq: Two times a day (BID) | NASAL | 0 refills | Status: DC

## 2020-04-02 NOTE — Telephone Encounter (Signed)
Patient falls under the protocol and refills sent up until May 2022

## 2020-04-02 NOTE — Telephone Encounter (Signed)
Will call patient to let her know that we will send a courtesy refill in for azelastine nasal spray and she must keep her appointment for 06/10/20

## 2020-04-04 DIAGNOSIS — G4733 Obstructive sleep apnea (adult) (pediatric): Secondary | ICD-10-CM | POA: Diagnosis not present

## 2020-04-05 ENCOUNTER — Other Ambulatory Visit: Payer: Self-pay | Admitting: Allergy & Immunology

## 2020-04-09 ENCOUNTER — Other Ambulatory Visit: Payer: Self-pay | Admitting: Family Medicine

## 2020-04-09 DIAGNOSIS — R69 Illness, unspecified: Secondary | ICD-10-CM | POA: Diagnosis not present

## 2020-04-10 ENCOUNTER — Ambulatory Visit: Payer: Medicare HMO | Admitting: Internal Medicine

## 2020-04-10 ENCOUNTER — Ambulatory Visit (INDEPENDENT_AMBULATORY_CARE_PROVIDER_SITE_OTHER): Payer: Medicare HMO | Admitting: Internal Medicine

## 2020-04-10 ENCOUNTER — Other Ambulatory Visit: Payer: Self-pay

## 2020-04-10 DIAGNOSIS — M79641 Pain in right hand: Secondary | ICD-10-CM

## 2020-04-10 MED ORDER — CEPHALEXIN 500 MG PO CAPS: 500.0000 mg | ORAL_CAPSULE | Freq: Two times a day (BID) | ORAL | 0 refills | Status: AC

## 2020-04-10 NOTE — Patient Instructions (Signed)
We have sent in keflex to take 1 pill twice a day for 5 days. 

## 2020-04-10 NOTE — Progress Notes (Signed)
   Subjective:   Patient ID: [name], female    DOB: Aug 04, 1955, 65 y.o.   MRN: [id number]  HPI The patient is a 65 YO female coming in for cat scratch on the hand. Happened earlier this week and did break the skin. She did clean and put antibiotic ointment and bandage. Since that time it has gotten some redness around it and still hurting. Denies fevers or chills. It was her cat and did bite her unexpectedly.   Review of Systems  Constitutional: Negative.   HENT: Negative.   Eyes: Negative.   Respiratory: Negative for cough, chest tightness and shortness of breath.   Cardiovascular: Negative for chest pain, palpitations and leg swelling.  Gastrointestinal: Negative for abdominal distention, abdominal pain, constipation, diarrhea, nausea and vomiting.  Musculoskeletal: Negative.   Skin: Positive for wound.  Neurological: Negative.   Psychiatric/Behavioral: Negative.     Objective:  Physical Exam Constitutional:      Appearance: She is well-developed and well-nourished.  HENT:     Head: Normocephalic and atraumatic.  Eyes:     Extraocular Movements: EOM normal.  Cardiovascular:     Rate and Rhythm: Normal rate and regular rhythm.  Pulmonary:     Effort: Pulmonary effort is normal. No respiratory distress.     Breath sounds: Normal breath sounds. No wheezing or rales.  Abdominal:     General: Bowel sounds are normal. There is no distension.     Palpations: Abdomen is soft.     Tenderness: There is no abdominal tenderness. There is no rebound.  Musculoskeletal:        General: No edema.     Cervical back: Normal range of motion.  Skin:    General: Skin is warm and dry.     Comments: Scratch with puncture close to right thumb with surrounding redness and tender, also on the top of the hand with some swelling and redness surrounding a scratch.  Neurological:     Mental Status: She is alert and oriented to person, place, and time.     Coordination: Coordination  normal.  Psychiatric:        Mood and Affect: Mood and affect normal.     Vitals:   04/10/20 1510  BP: 138/80  Pulse: 89  Temp: 98.1 F (36.7 C)  TempSrc: Oral  SpO2: 96%  Height: 5' (1.524 m)    This visit occurred during the SARS-CoV-2 public health emergency.  Safety protocols were in place, including screening questions prior to the visit, additional usage of staff PPE, and extensive cleaning of exam room while observing appropriate contact time as indicated for disinfecting solutions.   Assessment & Plan:

## 2020-04-11 ENCOUNTER — Encounter: Payer: Self-pay | Admitting: Internal Medicine

## 2020-04-11 DIAGNOSIS — M79641 Pain in right hand: Secondary | ICD-10-CM | POA: Insufficient documentation

## 2020-04-11 NOTE — Assessment & Plan Note (Signed)
Suspect some cellulitis. Rx keflex (patient states she does fine with pcn allergies reviewed with her) for 5 days. Can use bandages and antibiotic ointment. Return for worsening pain or redness or lack of improvement. Tdap up to date.

## 2020-04-24 DIAGNOSIS — M1711 Unilateral primary osteoarthritis, right knee: Secondary | ICD-10-CM | POA: Diagnosis not present

## 2020-05-01 DIAGNOSIS — M1711 Unilateral primary osteoarthritis, right knee: Secondary | ICD-10-CM | POA: Diagnosis not present

## 2020-05-02 DIAGNOSIS — G4733 Obstructive sleep apnea (adult) (pediatric): Secondary | ICD-10-CM | POA: Diagnosis not present

## 2020-05-03 ENCOUNTER — Other Ambulatory Visit: Payer: Self-pay | Admitting: Family Medicine

## 2020-05-03 DIAGNOSIS — M810 Age-related osteoporosis without current pathological fracture: Secondary | ICD-10-CM

## 2020-05-05 ENCOUNTER — Other Ambulatory Visit: Payer: Self-pay | Admitting: Family Medicine

## 2020-05-05 DIAGNOSIS — M810 Age-related osteoporosis without current pathological fracture: Secondary | ICD-10-CM

## 2020-05-07 DIAGNOSIS — M1711 Unilateral primary osteoarthritis, right knee: Secondary | ICD-10-CM | POA: Diagnosis not present

## 2020-05-09 DIAGNOSIS — R69 Illness, unspecified: Secondary | ICD-10-CM | POA: Diagnosis not present

## 2020-05-21 DIAGNOSIS — M654 Radial styloid tenosynovitis [de Quervain]: Secondary | ICD-10-CM | POA: Diagnosis not present

## 2020-05-21 DIAGNOSIS — M1812 Unilateral primary osteoarthritis of first carpometacarpal joint, left hand: Secondary | ICD-10-CM | POA: Diagnosis not present

## 2020-05-21 DIAGNOSIS — M25532 Pain in left wrist: Secondary | ICD-10-CM | POA: Diagnosis not present

## 2020-05-21 DIAGNOSIS — M180 Bilateral primary osteoarthritis of first carpometacarpal joints: Secondary | ICD-10-CM | POA: Diagnosis not present

## 2020-05-21 DIAGNOSIS — M19011 Primary osteoarthritis, right shoulder: Secondary | ICD-10-CM | POA: Diagnosis not present

## 2020-05-27 ENCOUNTER — Ambulatory Visit: Payer: Medicare HMO | Admitting: Allergy & Immunology

## 2020-06-02 DIAGNOSIS — G4733 Obstructive sleep apnea (adult) (pediatric): Secondary | ICD-10-CM | POA: Diagnosis not present

## 2020-06-04 DIAGNOSIS — M654 Radial styloid tenosynovitis [de Quervain]: Secondary | ICD-10-CM | POA: Diagnosis not present

## 2020-06-04 DIAGNOSIS — M25532 Pain in left wrist: Secondary | ICD-10-CM | POA: Diagnosis not present

## 2020-06-04 DIAGNOSIS — M180 Bilateral primary osteoarthritis of first carpometacarpal joints: Secondary | ICD-10-CM | POA: Diagnosis not present

## 2020-06-04 DIAGNOSIS — M19011 Primary osteoarthritis, right shoulder: Secondary | ICD-10-CM | POA: Diagnosis not present

## 2020-06-10 ENCOUNTER — Ambulatory Visit (INDEPENDENT_AMBULATORY_CARE_PROVIDER_SITE_OTHER): Payer: Medicare HMO | Admitting: Allergy & Immunology

## 2020-06-10 ENCOUNTER — Other Ambulatory Visit: Payer: Self-pay

## 2020-06-10 ENCOUNTER — Encounter: Payer: Self-pay | Admitting: Allergy & Immunology

## 2020-06-10 VITALS — BP 110/70 | HR 84 | Temp 97.9°F | Resp 16

## 2020-06-10 DIAGNOSIS — J383 Other diseases of vocal cords: Secondary | ICD-10-CM

## 2020-06-10 DIAGNOSIS — J3089 Other allergic rhinitis: Secondary | ICD-10-CM

## 2020-06-10 DIAGNOSIS — J4540 Moderate persistent asthma, uncomplicated: Secondary | ICD-10-CM

## 2020-06-10 DIAGNOSIS — J302 Other seasonal allergic rhinitis: Secondary | ICD-10-CM

## 2020-06-10 NOTE — Patient Instructions (Addendum)
1. Moderate persistent asthma, uncomplicated - Spirometry looked good today. - Stop the Flovent and start Symbicort 156mcg two puffs twice daily (this will allow you to use less of the rescue inhaler). - Daily controller medication(s): Singulair 10mg  daily and Symbicort 184mcg 2 puffs twice daily with spacer - Prior to physical activity: albuterol 2 puffs 10-15 minutes before physical activity. - Rescue medications: albuterol 4 puffs every 4-6 hours as needed - Changes during respiratory infections or worsening symptoms: Add on Flovent 258mcg to 4 puffs 2 times daily for TWO WEEKS. - Asthma control goals:  * Full participation in all desired activities (may need albuterol before activity) * Albuterol use two time or less a week on average (not counting use with activity) * Cough interfering with sleep two time or less a month * Oral steroids no more than once a year * No hospitalizations  2. Seasonal and perennial allergic rhinitis - Continue with Singulair 10mg  once daily. - Continue with carbinoxamine up to three times daily. - Continue with azelastine one spray per nostril daily as needed.  - Continue with Pataday eye drops twice daily as needed.  3. Return in about 6 months (around 12/11/2020).    Please inform us of any Emergency Department visits, hospitalizations, or changes in symptoms. Call us before going to the ED for breathing or allergy symptoms since we might be able to fit you in for a sick visit. Feel free to contact us anytime with any questions, problems, or concerns.  It was a pleasure to see you again today! I hope you heal fully!   Websites that have reliable patient information: 1. American Academy of Asthma, Allergy, and Immunology: www.aaaai.org 2. Food Allergy Research and Education (FARE): foodallergy.org 3. Mothers of Asthmatics: http://www.asthmacommunitynetwork.org 4. American College of Allergy, Asthma, and Immunology: www.acaai.org   COVID-19 Vaccine  Information can be found at: ShippingScam.co.uk For questions related to vaccine distribution or appointments, please email [email or web address] or call [phone].   We realize that you might be concerned about having an allergic reaction to the COVID19 vaccines. To help with that concern, WE ARE OFFERING THE COVID19 VACCINES IN OUR OFFICE! Ask the front desk for dates!     "Like" [place] on Facebook and Instagram for our latest updates!      A healthy democracy works best when [employer] participate! Make sure you are registered to vote! If you have moved or changed any of your contact information, you will need to get this updated before voting!  In some cases, you MAY be able to register to vote online: [email or web address]

## 2020-06-10 NOTE — Progress Notes (Signed)
FOLLOW UP  Date of Service/Encounter:  06/10/20   Assessment:   Moderate persistent asthma, uncomplicated  Paradoxical vocal fold motion - followed by Dr. Benjamine Mola  Seasonal and perennial allergic rhinitis - s/p two rounds of antibiotics   Fully vaccinated to COVID-19 [place] [place])   Plan/Recommendations:   1. Moderate persistent asthma, uncomplicated - Spirometry looked good today. - Stop the Flovent and start Symbicort 171mcg two puffs twice daily (this will allow you to use less of the rescue inhaler). - Daily controller medication(s): Singulair 10mg  daily and Symbicort 111mcg 2 puffs twice daily with spacer - Prior to physical activity: albuterol 2 puffs 10-15 minutes before physical activity. - Rescue medications: albuterol 4 puffs every 4-6 hours as needed - Changes during respiratory infections or worsening symptoms: Add on Flovent 223mcg to 4 puffs 2 times daily for TWO WEEKS. - Asthma control goals:  * Full participation in all desired activities (may need albuterol before activity) * Albuterol use two time or less a week on average (not counting use with activity) * Cough interfering with sleep two time or less a month * Oral steroids no more than once a year * No hospitalizations  2. Seasonal and perennial allergic rhinitis - Continue with Singulair 10mg  once daily. - Continue with carbinoxamine up to three times daily. - Continue with azelastine one spray per nostril daily as needed.  - Continue with Pataday eye drops twice daily as needed.  3. Return in about 6 months (around 12/11/2020).   Subjective:   [name] is a 65 y.o. female presenting today for follow up of  Chief Complaint  Patient presents with  . Allergies    Having to use inhaler more in the past month     [name] has a history of the following: Patient Active Problem List   Diagnosis Date Noted  . Right hand pain 04/11/2020  . Bilateral leg edema 03/12/2020  . Grade I  diastolic dysfunction 56/38/7564  . Type 2 diabetes mellitus without complication, without long-term current use of insulin ([place]) 03/12/2020  . 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[place]) 07/27/2019  . Iron deficiency anemia 04/28/2018  . Ambulatory dysfunction 01/16/2018  . Serotonin syndrome 01/16/2018  . OA (osteoarthritis) of knee 12/26/2017  . PVC (premature ventricular contraction) 11/21/2017  . Dysphonia 09/26/2017  . Abnormal liver function test 04/04/2017  . Vitamin D deficiency, unspecified 10/13/2016  . Hyperlipidemia 09/07/2016  . Osteoporosis without current pathological fracture 03/22/2016  . 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 Bipolar 1 disorder, mixed, moderate ([place]) 01/29/2016  . Class 1 obesity with body mass index (BMI) of 32.0 to 32.9 in adult 01/06/2016  . History of skin cancer 03/10/2015  . Bilateral knee pain 12/06/2014  . Migraines 11/21/2014  . Increased frequency of urination 05/30/2013  . Tremor 02/08/2012  . OTH D/O MENSTRUATION&OTH ABN BLEED FE GNT TRACT 12/01/2007  . OSA on CPAP 01/30/2007  . GERD 11/02/2006  . DERMATITIS, HANDS 11/02/2006  . Hypothyroidism 10/07/2006  . Bipolar affective ([place]) 10/07/2006  . Essential hypertension 10/07/2006  . RHINITIS, CHRONIC 10/07/2006  . Perennial allergic rhinitis 10/07/2006  . Moderate persistent asthma 10/07/2006  . Irritable bowel syndrome 10/07/2006  . INTERSTITIAL CYSTITIS 10/07/2006  . ROSACEA 10/07/2006  . Osteoarthritis of both knees 10/07/2006  . Disturbance in sleep behavior 10/07/2006  . Personal history of other malignant neoplasm of skin 10/07/2006    History obtained from: chart review and patient.  [name] is a 64  y.o. female presenting for a follow up visit.  She was last seen in October 2021.  At that time, her spirometry was not done.  We continued Flovent 220 mcg 1 to 2 puffs twice daily with a spacer as well as Singulair 10 mg daily.  For  her rhinitis, we continue with Singulair as well as carbinoxamine, Astelin, and Pataday as needed.  Since last visit, she has done well.    Asthma/Respiratory Symptom History: She remaisn on the Flovent 231mcg two puffs BID.  She has been using her inhaler more in the past month during the increased pollen exposure. She will sneeze between 3-6 times. She has been on Symbicort in the past. She does not think that there was a bad reaction to it.   Allergic Rhinitis Symptom History: Overall she is doing fairly well. She is still have a lot of rhinorrhea even with the carbinoxamine. She does use her nose sprays fairly consistently. She was on shots twice in the past. She prefers to not go on them again.   She fells three weeks ago and sprained her wrist on her left arm and got a hairline fracture. Thankfully this is not going to require any surgery.   Otherwise, there have been no changes to her past medical history, surgical history, family history, or social history.    Review of Systems  Constitutional: Negative.  Negative for chills, fever, malaise/fatigue and weight loss.  HENT: Negative for congestion, ear discharge, ear pain and sinus pain.   Eyes: Negative for pain, discharge and redness.  Respiratory: Negative for cough, sputum production, shortness of breath and wheezing.   Cardiovascular: Negative.  Negative for chest pain and palpitations.  Gastrointestinal: Negative for abdominal pain, constipation, diarrhea, heartburn, nausea and vomiting.  Skin: Negative.  Negative for itching and rash.  Neurological: Negative for dizziness and headaches.  Endo/Heme/Allergies: Positive for environmental allergies. Does not bruise/bleed easily.       Objective:   Blood pressure 110/70, pulse 84, temperature 97.9 F (36.6 C), temperature source Temporal, resp. rate 16, last menstrual period 03/05/2012, SpO2 96 %. There is no height or weight on file to calculate BMI.   Physical  Exam:  Physical Exam Constitutional:      Appearance: She is well-developed.  HENT:     Head: Normocephalic and atraumatic.     Right Ear: Tympanic membrane, ear canal and external ear normal.     Left Ear: Tympanic membrane, ear canal and external ear normal.     Nose: Mucosal edema and rhinorrhea present. No nasal deformity or septal deviation.     Right Turbinates: Enlarged, swollen and pale.     Left Turbinates: Enlarged, swollen and pale.     Right Sinus: No maxillary sinus tenderness or frontal sinus tenderness.     Left Sinus: No maxillary sinus tenderness or frontal sinus tenderness.     Mouth/Throat:     Mouth: Mucous membranes are not pale and not dry.     Pharynx: Uvula midline.  Eyes:     General:        Right eye: No discharge.        Left eye: No discharge.     Conjunctiva/sclera: Conjunctivae normal.     Right eye: Right conjunctiva is not injected. No chemosis.    Left eye: Left conjunctiva is not injected. No chemosis.    Pupils: Pupils are equal, round, and reactive to light.  Cardiovascular:     Rate and Rhythm: Normal rate  and regular rhythm.     Heart sounds: Normal heart sounds.  Pulmonary:     Effort: Pulmonary effort is normal. No tachypnea, accessory muscle usage or respiratory distress.     Breath sounds: Normal breath sounds. No wheezing, rhonchi or rales.     Comments: Moving air well in all lung fields. No increased work of breathing noted.  Chest:     Chest wall: No tenderness.  Lymphadenopathy:     Cervical: No cervical adenopathy.  Skin:    General: Skin is warm.     Capillary Refill: Capillary refill takes less than 2 seconds.     Coloration: Skin is not pale.     Findings: No abrasion, erythema, petechiae or rash. Rash is not papular, urticarial or vesicular.  Neurological:     Mental Status: She is alert.  Psychiatric:        Behavior: Behavior is cooperative.      Diagnostic studies:    Spirometry: results normal (FEV1: 1.74/90%,  FVC: 2.39/98%, FEV1/FVC: 73%).    Spirometry consistent with normal pattern.   Allergy Studies: none       Salvatore Marvel, MD  [facility]

## 2020-06-11 ENCOUNTER — Encounter: Payer: Self-pay | Admitting: Allergy & Immunology

## 2020-06-11 DIAGNOSIS — J383 Other diseases of vocal cords: Secondary | ICD-10-CM | POA: Insufficient documentation

## 2020-06-11 DIAGNOSIS — R69 Illness, unspecified: Secondary | ICD-10-CM | POA: Diagnosis not present

## 2020-06-12 ENCOUNTER — Telehealth: Payer: Self-pay | Admitting: Allergy & Immunology

## 2020-06-12 MED ORDER — MONTELUKAST SODIUM 10 MG PO TABS: ORAL_TABLET | ORAL | 1 refills | Status: DC

## 2020-06-12 MED ORDER — CARBINOXAMINE MALEATE 4 MG PO TABS: 1.0000 | ORAL_TABLET | Freq: Four times a day (QID) | ORAL | 1 refills | Status: DC

## 2020-06-12 NOTE — Telephone Encounter (Signed)
Spoke with patient, informed her that refills have been sent to the requested pharmacy. Patient verbalized understanding. 

## 2020-06-12 NOTE — Telephone Encounter (Signed)
Pt states she went to pharmacy to pick up medication but prescription had not been sent in. Pt is requesting prescription for Singulair & carbinoxamine maleate to be sent to [facility]. Pt was seen 06/10/20.  Please advise.

## 2020-06-17 ENCOUNTER — Telehealth (INDEPENDENT_AMBULATORY_CARE_PROVIDER_SITE_OTHER): Payer: Medicare HMO | Admitting: Internal Medicine

## 2020-06-17 ENCOUNTER — Other Ambulatory Visit: Payer: Self-pay

## 2020-06-17 ENCOUNTER — Encounter: Payer: Self-pay | Admitting: Internal Medicine

## 2020-06-17 DIAGNOSIS — J069 Acute upper respiratory infection, unspecified: Secondary | ICD-10-CM | POA: Insufficient documentation

## 2020-06-17 MED ORDER — PROMETHAZINE-DM 6.25-15 MG/5ML PO SYRP
5.0000 mL | ORAL_SOLUTION | Freq: Two times a day (BID) | ORAL | 0 refills | Status: DC | PRN
Start: 2020-06-17 — End: 2020-08-25

## 2020-06-17 NOTE — Progress Notes (Signed)
Virtual Visit via Audio Note  I connected with MARCELLE HEPNER on 06/17/20 at  8:00 AM EDT by an audio-only enabled telemedicine application and verified that I am speaking with the correct person using two identifiers.  The patient and the provider were at separate locations throughout the entire encounter. Patient location: home, Provider location: work   I discussed the limitations of evaluation and management by telemedicine and the availability of in person appointments. The patient expressed understanding and agreed to proceed. The patient and the provider were the only parties present for the visit unless noted in HPI below.  History of Present Illness: The patient is a 65 y.o. female with visit for cough and running nose and congestion. Started Thursday night with cough and did not sleep well. Then got nasal congestion. Felt she had a cold. Denies fevers or chills or muscle aches. Is tired some more than usual. Denies SOB. Overall it is not improving. Has tried her normal allergy medication without relief and singulair and otc products which are like dayquil and nyquil. Has had 1 booster and 2 original shots. Has not taken any covid-19 tests.   Observations/Objective: A and O times 3, voice nasally, no coughing during visit.   Assessment and Plan: See problem oriented charting  Follow Up Instructions: offered covid-19 screening which patient declines, rx promethazine/dm cough syrup as patient states she cannot take tessalon perles  Visit time 12 minutes in non-face to face communication with patient and coordination of care.  I discussed the assessment and treatment plan with the patient. The patient was provided an opportunity to ask questions and all were answered. The patient agreed with the plan and demonstrated an understanding of the instructions.   The patient was advised to call back or seek an in-person evaluation if the symptoms worsen or if the condition fails to improve as  anticipated.  Hoyt Koch, MD

## 2020-06-17 NOTE — Assessment & Plan Note (Signed)
Declines covid-19 testing and is outside window for oral anti-virals so this is acceptable. She has had 3 covid-19 vaccines. Rx promethazine/dm cough syrup. She does not qualify for any antibiotics at this time which she asks about for her sinuses and we discussed that for sinus infection antibiotics are not indicated until at least 2 weeks or more into symptoms.

## 2020-06-19 ENCOUNTER — Telehealth (INDEPENDENT_AMBULATORY_CARE_PROVIDER_SITE_OTHER): Payer: Medicare HMO | Admitting: Family Medicine

## 2020-06-19 DIAGNOSIS — R059 Cough, unspecified: Secondary | ICD-10-CM | POA: Diagnosis not present

## 2020-06-19 DIAGNOSIS — J40 Bronchitis, not specified as acute or chronic: Secondary | ICD-10-CM

## 2020-06-19 MED ORDER — AMOXICILLIN-POT CLAVULANATE 875-125 MG PO TABS: 1.0000 | ORAL_TABLET | Freq: Two times a day (BID) | ORAL | 0 refills | Status: DC

## 2020-06-19 MED ORDER — PREDNISONE 20 MG PO TABS: 40.0000 mg | ORAL_TABLET | Freq: Every day | ORAL | 0 refills | Status: DC

## 2020-06-19 NOTE — Progress Notes (Signed)
Virtual Visit via Video Note  I connected with [name]  on 06/19/20 at  5:00 PM EDT by a video enabled telemedicine application and verified that I am speaking with the correct person using two identifiers.  Location patient: home, [place] Location provider:work or home office Persons participating in the virtual visit: patient, provider  I discussed the limitations of evaluation and management by telemedicine and the availability of in person appointments. The patient expressed understanding and agreed to proceed.   HPI:  Acute telemedicine visit for cough: -Onset: about 7-8 days ago -Symptoms include: nasal congestion, cough, had stopped up ear, eye were irritated, sinus discomfort, has coughed so hard that her ribs hurt when she coughs and presses on her ribs, she reports he feels like the cough is "rattling" or wheezing; has used her albuterol a few times which seems to help a little bit -Denies: fevers, NVD, inability to eat/drink/get out of bed  -Pertinent past medical history: asthma on singulair and flovent;  -Pertinent medication allergies:  Allergies  Allergen Reactions  . Emetrol Itching  . Indomethacin Other (See Comments)    Muscle spasms Causes muscle spasms in neck  . Iodinated Diagnostic Agents Itching    Flushed and Fever, itch all over  . Amlodipine Swelling    Peripheral edema  . Carbamazepine Other (See Comments)    Easy and unusual bleeding   . Pseudoephedrine Other (See Comments)    I fly off the walls  CANNOT SLEEP  . Risperidone And Related Other (See Comments)    Stomach upset, insomnia, drooling, tremors, "jerks," sensitivity to touch.   . Bupivacaine Hives  . Pentazocine Other (See Comments)    Unknown reaction MADE ME "LACTATE"  . Propoxyphene Itching and Nausea And Vomiting    darvocet  . Sulfa Antibiotics Other (See Comments)    Unknown reaction  . Tramadol Other (See Comments)    Patient can not remember  . Aspirin Other (See Comments)    upset  stomach, ringing in the ears  . Codeine Itching and Other (See Comments)    Anything related to codeine  . Etodolac Rash and Other (See Comments)  . Propranolol Nausea Only and Other (See Comments)   -COVID-19 vaccine status: vaccinated and boosted, had flu shot  ROS: See pertinent positives and negatives per HPI.  Past Medical History:  Diagnosis Date  . Allergic rhinitis   . Anxiety   . Benign essential tremor    hands  . Bipolar 1 disorder, mixed, moderate ([place])   . Claustrophobia   . Depression   . Eczema   . Frequency of urination   . History of frequent urinary tract infections   . History of gastroesophageal reflux (GERD)    05-25-2017  per pt no issues since hiatal hernia repair 01/ 2018  . History of hiatal hernia   . History of melanoma excision    early 2000s-- BACK  . History of panic attacks   . 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 Hypothyroidism   . Intermittent palpitations    cardiology--  dr Martinique  . Interstitial cystitis   . Limited jaw range of motion    s/p  bilateral TMJ surgery,  age 56s  . Migraines   . Moderate persistent asthma    pulmologist-- dr Halford Chessman  . OA (osteoarthritis)    both knees  . OSA on CPAP    per last sleep study 09/ 2017  mild OSA, AHI13/hr  .  PONV (postoperative nausea and vomiting)   . PVC (premature ventricular contraction)   . Rosacea   . Sensation of pressure in bladder area    per pt intermittant  . TMJ arthralgia   . Urticaria   . Wears glasses   . White coat syndrome without hypertension    05-25-2017  per pt hx hypertension yrs ago, no issues after quitting stressful job    Past Surgical History:  Procedure Laterality Date  . [place] STUDY N/A 09/22/2015   Procedure: [place] STUDY;  Surgeon: Doran Stabler, MD;  Location: [facility];  Service: Gastroenterology;  Laterality: N/A;  . ADENOIDECTOMY    . ANAL FISSURE REPAIR  age 27  (68)  . BREAST EXCISIONAL BIOPSY Right  04-16-2003  dr p. young  [place]   benign  . BUNIONECTOMY Right early 2000s  . CARPAL TUNNEL RELEASE Left age 53 (56)  . CHOLECYSTECTOMY OPEN  age 33  . CYSTO WITH HYDRODISTENSION N/A 06/02/2017   Procedure: CYSTOSCOPY/HYDRODISTENSION OF BLADDER;  Surgeon: Irine Seal, MD;  [facility];  Service: Urology;  Laterality: N/A;  . CYSTO/ HYDRODISTENTION/  INSTILLATION THERAPY  1990s  . DX LAPAROSCOPY/  DX HYSTEROSCOPY/  D & C  08-07-2007   dr dove  [facility]  . ENDOMETRIAL ABLATION W/ NOVASURE  10-21-2008   dr Harolyn Rutherford  [facility]  . ESOPHAGEAL MANOMETRY N/A 09/22/2015   Procedure: ESOPHAGEAL MANOMETRY (EM);  Surgeon: Doran Stabler, MD;  Location: [facility];  Service: Gastroenterology;  Laterality: N/A;  with impedence   . FINGER ARTHROPLASTY Bilateral    left little finger 2016:  right middle finger 06/ 2018  . KNEE ARTHROSCOPY Bilateral x3 left ;  x3  right-- last one age 66 (14)  . NASAL SEPTUM SURGERY  age 68s  . ROBOT ASSISTED REDUCTION PARAESOPHAGEAL HIATAL HERNIA/ TYPE 2 MEDIASTINAL DISSECTION/ PRIMARY HIATAL HERNIA REPAIR/ ANTERIOR & POSTERIOR GASTROPEXY/ NISSEN FUNDOPLICATION  [id number]   DR GROSS  [facility]  . SINOSCOPY    . TEMPOROMANDIBULAR JOINT SURGERY Bilateral x3  -- age 71s   per pt used graft  . TONSILLECTOMY    . TOTAL KNEE ARTHROPLASTY Left 12/26/2017   Procedure: LEFT TOTAL KNEE ARTHROPLASTY;  Surgeon: Gaynelle Arabian, MD;  Location: [facility];  Service: Orthopedics;  Laterality: Left;  27min  . TRANSTHORACIC ECHOCARDIOGRAM  12-06-2017   dr Martinique   ef 55-60%, grade 1 diastoilc dysfunction, trivial MR  . TRIGGER FINGER RELEASE Bilateral last one 2017   several release's bilaterally  . ULNAR NERVE TRANSPOSITION Bilateral age 88 (1980)     Current Outpatient Medications:  .  amoxicillin-clavulanate (AUGMENTIN) 875-125 MG tablet, Take 1 tablet by mouth 2 (two) times daily., Disp: 20 tablet, Rfl: 0 .  predniSONE (DELTASONE) 20 MG tablet, Take 2 tablets (40 mg  total) by mouth daily with breakfast., Disp: 8 tablet, Rfl: 0 .  acetaminophen (TYLENOL) 500 MG tablet, Take 500 mg by mouth every 6 (six) hours as needed., Disp: , Rfl:  .  albuterol (VENTOLIN HFA) 108 (90 Base) MCG/ACT inhaler, Inhale 1-2 puffs into the lungs daily as needed for wheezing or shortness of breath., Disp: 18 g, Rfl: 1 .  Ascorbic Acid (VITAMIN C CR) 500 MG CPCR, Take by mouth., Disp: , Rfl:  .  Azelastine HCl 0.15 % SOLN, Place 1-2 sprays into both nostrils 2 (two) times daily., Disp: 12 mL, Rfl: 0 .  Brexpiprazole (REXULTI) 3 MG TABS, Take by mouth., Disp: ,  Rfl:  .  Carbinoxamine Maleate 4 MG TABS, Take 1 tablet (4 mg total) by mouth every 6 (six) hours., Disp: 180 tablet, Rfl: 1 .  celecoxib (CELEBREX) 200 MG capsule, Take 200 mg by mouth daily., Disp: , Rfl:  .  Cinnamon 500 MG capsule, Cinnamon 500 mg capsule, Disp: , Rfl:  .  famotidine (PEPCID) 20 MG tablet, Take 20 mg by mouth at bedtime., Disp: , Rfl:  .  fluticasone (FLONASE) 50 MCG/ACT nasal spray, PLACE 1 SPRAY INTO BOTH NOSTRILS 2 (TWO) TIMES DAILY AS NEEDED FOR ALLERGIES OR RHINITIS., Disp: 48 mL, Rfl: 0 .  fluticasone (FLOVENT HFA) 220 MCG/ACT inhaler, Inhale 1-2 puffs into the lungs in the morning and at bedtime., Disp: 36 each, Rfl: 2 .  furosemide (LASIX) 20 MG tablet, furosemide 20 mg tablet, Disp: , Rfl:  .  hydrocortisone 2.5 % ointment, Apply topically 2 (two) times daily., Disp: , Rfl:  .  ketoconazole (NIZORAL) 2 % cream, Apply topically daily. , Disp: , Rfl:  .  levothyroxine (SYNTHROID) 50 MCG tablet, TAKE 1 TABLET EVERY DAY IN THE MORNING, Disp: 90 tablet, Rfl: 3 .  metoprolol tartrate (LOPRESSOR) 25 MG tablet, TAKE 1 TABLET BY MOUTH TWICE A DAY, Disp: 180 tablet, Rfl: 1 .  metroNIDAZOLE (METROCREAM) 0.75 % cream, Apply 1 application topically 2 (two) times daily., Disp: , Rfl:  .  montelukast (SINGULAIR) 10 MG tablet, TAKE 1 TABLET BY MOUTH EVERYDAY AT BEDTIME, Disp: 90 tablet, Rfl: 1 .  MYRBETRIQ 25 MG  TB24 tablet, Take 25 mg by mouth daily., Disp: , Rfl:  .  Olopatadine HCl (PATADAY) 0.2 % SOLN, Place 1 drop into both eyes daily as needed., Disp: 2.5 mL, Rfl: 5 .  omeprazole (PRILOSEC) 20 MG capsule, omeprazole 20 mg capsule,delayed release, Disp: , Rfl:  .  promethazine-dextromethorphan (PROMETHAZINE-DM) 6.25-15 MG/5ML syrup, Take 5 mLs by mouth 2 (two) times daily as needed for cough., Disp: 118 mL, Rfl: 0 .  rizatriptan (MAXALT-MLT) 10 MG disintegrating tablet, TAKE 1 TABLET EVERY DAY AS NEEDED FOR MIGRAINE, MAY REPEAT IN 2 HRS IF NEEDED. MAX 2 DOSES/WK, Disp: 9 tablet, Rfl: 2 .  rosuvastatin (CRESTOR) 20 MG tablet, TAKE 1 TABLET BY MOUTH EVERY DAY, Disp: 90 tablet, Rfl: 1 .  sertraline (ZOLOFT) 100 MG tablet, Take 200 mg by mouth at bedtime. , Disp: , Rfl:  .  sodium chloride (OCEAN) 0.65 % SOLN nasal spray, Place 1 spray into both nostrils as needed for congestion., Disp: , Rfl:  .  torsemide (DEMADEX) 20 MG tablet, Take 1 tablet (20 mg total) by mouth daily., Disp: 90 tablet, Rfl: 1 .  trihexyphenidyl (ARTANE) 2 MG tablet, Take 2 mg by mouth daily., Disp: , Rfl:  .  zinc gluconate 50 MG tablet, Take 50 mg by mouth daily., Disp: , Rfl:  .  zolpidem (AMBIEN) 10 MG tablet, Take 10 mg by mouth at bedtime., Disp: , Rfl:   EXAM:  VITALS per patient if applicable:  GENERAL: alert, oriented, appears well and in no acute distress  HEENT: atraumatic, conjunttiva clear, no obvious abnormalities on inspection of external nose and ears  NECK: normal movements of the head and neck  LUNGS: on inspection no signs of respiratory distress, breathing rate appears normal, no obvious gross SOB, gasping or wheezing  CV: no obvious cyanosis  MS: moves all visible extremities without noticeable abnormality  PSYCH/NEURO: pleasant and cooperative, no obvious depression or anxiety, speech and thought processing grossly intact  ASSESSMENT AND PLAN:  Discussed the following assessment and  plan:  Cough  Bronchitis  -we discussed possible serious and likely etiologies, options for evaluation and workup, limitations of telemedicine visit vs in person visit, treatment, treatment risks and precautions. Pt prefers to treat via telemedicine empirically rather than in person at this moment. Given the worsening symptoms, lots of mucus and "rattle" she opted for treatment with Augmentin and Prednisone. She has a lengthy list of medication allergy/intolerances, but asures me she can take both of these without issue. She has albuterol to use as needed. Also advised a covid test, though at this point she is out of the acute treatment window. Did advise she follow up with PCP if not improving promptly or positive covid test.  Advised to seek prompt in person care if worsening, new symptoms arise, or if is not improving with treatment. Discussed options for inperson care if PCP office not available. Did let this patient know that I only do telemedicine on Tuesdays and Thursdays for [place]. Advised to schedule follow up visit with PCP or [facility] if any further questions or concerns to avoid delays in care.   I discussed the assessment and treatment plan with the patient. The patient was provided an opportunity to ask questions and all were answered. The patient agreed with the plan and demonstrated an understanding of the instructions.     Lucretia Kern, DO

## 2020-06-19 NOTE — Patient Instructions (Signed)
  HOME CARE TIPS:  -[place] testing information: [email or web address] OR [phone] Most pharmacies also offer testing and home test kits. If the Covid19 test is positive, please make a prompt follow up visit with your primary care office or with [facility] to discuss treatment options. Treatments for Covid19 are best given early in the course of the illness.   -I sent the medication(s) we discussed to your pharmacy: Meds ordered this encounter  Medications  . amoxicillin-clavulanate (AUGMENTIN) 875-125 MG tablet    Sig: Take 1 tablet by mouth 2 (two) times daily.    Dispense:  20 tablet    Refill:  0  . predniSONE (DELTASONE) 20 MG tablet    Sig: Take 2 tablets (40 mg total) by mouth daily with breakfast.    Dispense:  8 tablet    Refill:  0     -can use nasal saline a few times per day if you have nasal congestion; sometimes  a short course of Afrin nasal spray for 3 days can help with symptoms as well  -stay hydrated, drink plenty of fluids and eat small healthy meals - avoid dairy  -can take 1000 IU (21mcg) Vit D3 and 100-500 mg of Vit C daily per instructions  -If the Covid test is positive, check out the CDC website for more information on home care, transmission and treatment for COVID19  -follow up with your doctor in 2-3 days unless improving and feeling better  -stay home while sick, except to seek medical care. If you have COVID19, ideally it would be best to stay home for a full 10 days since the onset of symptoms PLUS one day of no fever and feeling better. Wear a good mask that fits snugly (such as N95 or KN95) if around others to reduce the risk of transmission.  It was nice to meet you today, and I really hope you are feeling better soon. I help [facility] out with telemedicine visits on Tuesdays and Thursdays and am available for visits on those days. If you have any concerns or questions following this visit please  schedule a follow up visit with your Primary Care doctor or seek care at a local urgent care clinic to avoid delays in care.    Seek in person care or schedule a follow up video visit promptly if your symptoms worsen, new concerns arise or you are not improving with treatment. Call 911 and/or seek emergency care if your symptoms are severe or life threatening.

## 2020-06-23 DIAGNOSIS — F411 Generalized anxiety disorder: Secondary | ICD-10-CM | POA: Diagnosis not present

## 2020-06-23 DIAGNOSIS — M25642 Stiffness of left hand, not elsewhere classified: Secondary | ICD-10-CM | POA: Diagnosis not present

## 2020-06-23 DIAGNOSIS — F410 Panic disorder [episodic paroxysmal anxiety] without agoraphobia: Secondary | ICD-10-CM | POA: Diagnosis not present

## 2020-06-23 DIAGNOSIS — R69 Illness, unspecified: Secondary | ICD-10-CM | POA: Diagnosis not present

## 2020-06-26 DIAGNOSIS — H2513 Age-related nuclear cataract, bilateral: Secondary | ICD-10-CM | POA: Diagnosis not present

## 2020-06-26 DIAGNOSIS — H5213 Myopia, bilateral: Secondary | ICD-10-CM | POA: Diagnosis not present

## 2020-06-26 DIAGNOSIS — E119 Type 2 diabetes mellitus without complications: Secondary | ICD-10-CM | POA: Diagnosis not present

## 2020-06-26 DIAGNOSIS — H524 Presbyopia: Secondary | ICD-10-CM | POA: Diagnosis not present

## 2020-06-26 LAB — HM DIABETES EYE EXAM

## 2020-07-02 DIAGNOSIS — M654 Radial styloid tenosynovitis [de Quervain]: Secondary | ICD-10-CM | POA: Diagnosis not present

## 2020-07-02 DIAGNOSIS — M25532 Pain in left wrist: Secondary | ICD-10-CM | POA: Diagnosis not present

## 2020-07-02 DIAGNOSIS — M19011 Primary osteoarthritis, right shoulder: Secondary | ICD-10-CM | POA: Diagnosis not present

## 2020-07-02 DIAGNOSIS — M180 Bilateral primary osteoarthritis of first carpometacarpal joints: Secondary | ICD-10-CM | POA: Diagnosis not present

## 2020-07-02 DIAGNOSIS — M1812 Unilateral primary osteoarthritis of first carpometacarpal joint, left hand: Secondary | ICD-10-CM | POA: Diagnosis not present

## 2020-07-03 DIAGNOSIS — M170 Bilateral primary osteoarthritis of knee: Secondary | ICD-10-CM | POA: Diagnosis not present

## 2020-07-03 DIAGNOSIS — M1712 Unilateral primary osteoarthritis, left knee: Secondary | ICD-10-CM | POA: Diagnosis not present

## 2020-07-03 DIAGNOSIS — M1711 Unilateral primary osteoarthritis, right knee: Secondary | ICD-10-CM | POA: Diagnosis not present

## 2020-07-04 ENCOUNTER — Other Ambulatory Visit: Payer: Self-pay | Admitting: Family Medicine

## 2020-07-04 DIAGNOSIS — E039 Hypothyroidism, unspecified: Secondary | ICD-10-CM

## 2020-07-05 ENCOUNTER — Other Ambulatory Visit: Payer: Self-pay | Admitting: Allergy & Immunology

## 2020-07-08 NOTE — Telephone Encounter (Signed)
Please advise patient uses azelastine nasal spray and previous office note does not have Flonase listed.

## 2020-07-11 ENCOUNTER — Other Ambulatory Visit: Payer: Self-pay | Admitting: Family Medicine

## 2020-07-11 DIAGNOSIS — I493 Ventricular premature depolarization: Secondary | ICD-10-CM

## 2020-07-11 DIAGNOSIS — I10 Essential (primary) hypertension: Secondary | ICD-10-CM

## 2020-07-14 ENCOUNTER — Other Ambulatory Visit: Payer: Self-pay | Admitting: Family Medicine

## 2020-07-14 ENCOUNTER — Other Ambulatory Visit: Payer: Self-pay | Admitting: Allergy & Immunology

## 2020-07-14 DIAGNOSIS — I10 Essential (primary) hypertension: Secondary | ICD-10-CM

## 2020-07-14 DIAGNOSIS — I493 Ventricular premature depolarization: Secondary | ICD-10-CM

## 2020-07-15 DIAGNOSIS — R69 Illness, unspecified: Secondary | ICD-10-CM | POA: Diagnosis not present

## 2020-07-16 ENCOUNTER — Other Ambulatory Visit: Payer: Self-pay | Admitting: Internal Medicine

## 2020-07-16 ENCOUNTER — Other Ambulatory Visit: Payer: Self-pay | Admitting: Family Medicine

## 2020-07-16 DIAGNOSIS — I493 Ventricular premature depolarization: Secondary | ICD-10-CM

## 2020-07-16 DIAGNOSIS — I10 Essential (primary) hypertension: Secondary | ICD-10-CM

## 2020-07-17 DIAGNOSIS — K219 Gastro-esophageal reflux disease without esophagitis: Secondary | ICD-10-CM | POA: Diagnosis not present

## 2020-07-17 DIAGNOSIS — R490 Dysphonia: Secondary | ICD-10-CM | POA: Diagnosis not present

## 2020-07-21 DIAGNOSIS — M25561 Pain in right knee: Secondary | ICD-10-CM | POA: Diagnosis not present

## 2020-07-31 ENCOUNTER — Telehealth: Payer: Self-pay | Admitting: Internal Medicine

## 2020-07-31 NOTE — Telephone Encounter (Signed)
    Patient called and said that she still has a cough. She had two virtual appointments in early May. She was wondering what she could do to get rid of the cough. She can be reached at [phone]. Please advise

## 2020-08-01 ENCOUNTER — Other Ambulatory Visit: Payer: Self-pay | Admitting: Internal Medicine

## 2020-08-01 DIAGNOSIS — R059 Cough, unspecified: Secondary | ICD-10-CM | POA: Insufficient documentation

## 2020-08-01 NOTE — Telephone Encounter (Signed)
Pt has been informed and expressed understanding.  

## 2020-08-13 DIAGNOSIS — M25561 Pain in right knee: Secondary | ICD-10-CM | POA: Diagnosis not present

## 2020-08-14 DIAGNOSIS — M1711 Unilateral primary osteoarthritis, right knee: Secondary | ICD-10-CM | POA: Diagnosis not present

## 2020-08-25 ENCOUNTER — Ambulatory Visit (INDEPENDENT_AMBULATORY_CARE_PROVIDER_SITE_OTHER): Payer: Medicare HMO | Admitting: Family Medicine

## 2020-08-25 ENCOUNTER — Encounter: Payer: Self-pay | Admitting: Family Medicine

## 2020-08-25 ENCOUNTER — Other Ambulatory Visit: Payer: Self-pay

## 2020-08-25 VITALS — BP 120/76 | HR 100 | Temp 98.7°F | Resp 16 | Ht 60.0 in | Wt 179.4 lb

## 2020-08-25 DIAGNOSIS — W5501XA Bitten by cat, initial encounter: Secondary | ICD-10-CM | POA: Diagnosis not present

## 2020-08-25 DIAGNOSIS — Z23 Encounter for immunization: Secondary | ICD-10-CM | POA: Diagnosis not present

## 2020-08-25 DIAGNOSIS — L03116 Cellulitis of left lower limb: Secondary | ICD-10-CM

## 2020-08-25 DIAGNOSIS — R110 Nausea: Secondary | ICD-10-CM | POA: Diagnosis not present

## 2020-08-25 DIAGNOSIS — S81852A Open bite, left lower leg, initial encounter: Secondary | ICD-10-CM | POA: Diagnosis not present

## 2020-08-25 MED ORDER — CEFTRIAXONE SODIUM 500 MG IJ SOLR
500.0000 mg | Freq: Once | INTRAMUSCULAR | Status: AC
  Administered 2020-08-25: 500 mg via INTRAMUSCULAR

## 2020-08-25 MED ORDER — AMOXICILLIN-POT CLAVULANATE 875-125 MG PO TABS: 1.0000 | ORAL_TABLET | Freq: Two times a day (BID) | ORAL | 0 refills | Status: DC

## 2020-08-25 MED ORDER — PROMETHAZINE HCL 25 MG PO TABS: 25.0000 mg | ORAL_TABLET | Freq: Three times a day (TID) | ORAL | 0 refills | Status: DC | PRN

## 2020-08-25 MED ORDER — HYDROCODONE-ACETAMINOPHEN 5-325 MG PO TABS: 1.0000 | ORAL_TABLET | Freq: Three times a day (TID) | ORAL | 0 refills | Status: AC | PRN

## 2020-08-25 NOTE — Progress Notes (Signed)
ACUTE VISIT Chief Complaint  Patient presents with   Animal Bite    Left leg, happened Friday night   HPI: [name] is a 65 y.o. female with history of hypothyroidism, hypertension, migraine, DM 2, PVCs,and bipolar disorder here today complaining of left LE painful wounds after one of her cats bit her as described above.  Her 19 yo cat "clammed down" and bit her left leg several times on 08/22/20 around 7:15 pm, she was getting ready to feed her. Attack seemed to be unprovoked, she thinks she may have stepped on her tail. She has not been sick and has vaccines up to date. After bite, she cleaned the area x 2 with soap and water, applied hydrogen peroxide and topical abx. She covered area with gauzed and paper tape because started bleeding. Pain is severe, constant, exacerbated by walking. Pain interferes with sleep. Is taking Tylenol at bedtime.  She has not noted fever,chills, CP,SOB,or palpitations.  Decreased appetite, which she thinks is caused by stress. She also had a migraine episode a day after cat bite, similar to her typical migraines.  Nausea without vomiting, attributed to pain and anxiety. She has taken Zofran in the past but Phenergan seems to do better. Last Tdap in 2015.  Review of Systems  Constitutional:  Positive for activity change and fatigue. Negative for chills.  HENT:  Negative for mouth sores, nosebleeds and sore throat.   Respiratory:  Negative for cough and wheezing.   Cardiovascular:  Positive for leg swelling.  Gastrointestinal:  Negative for abdominal pain and vomiting.       Negative for changes in bowel habits.  Musculoskeletal:  Positive for arthralgias and gait problem.  Skin:  Positive for rash.  Allergic/Immunologic: Negative for environmental allergies and food allergies.  Neurological:  Negative for syncope, facial asymmetry, weakness and numbness.  Psychiatric/Behavioral:  Negative for confusion. The patient is nervous/anxious.    Rest see pertinent positives and negatives per HPI.  Current Outpatient Medications on File Prior to Visit  Medication Sig Dispense Refill   acetaminophen (TYLENOL) 500 MG tablet Take 500 mg by mouth every 6 (six) hours as needed.     albuterol (VENTOLIN HFA) 108 (90 Base) MCG/ACT inhaler INHALE 1-2 PUFFS INTO THE LUNGS DAILY AS NEEDED FOR WHEEZING OR SHORTNESS OF BREATH. 18 each 1   Ascorbic Acid (VITAMIN C CR) 500 MG CPCR Take by mouth.     Brexpiprazole (REXULTI) 3 MG TABS Take by mouth.     Carbinoxamine Maleate 4 MG TABS Take 1 tablet (4 mg total) by mouth every 6 (six) hours. 180 tablet 1   celecoxib (CELEBREX) 200 MG capsule Take 200 mg by mouth daily.     Cinnamon 500 MG capsule Cinnamon 500 mg capsule     famotidine (PEPCID) 20 MG tablet Take 20 mg by mouth at bedtime.     fluticasone (FLONASE) 50 MCG/ACT nasal spray PLACE 1 SPRAY INTO BOTH NOSTRILS 2 (TWO) TIMES DAILY AS NEEDED FOR ALLERGIES OR RHINITIS. 48 mL 1   fluticasone (FLOVENT HFA) 220 MCG/ACT inhaler Inhale 1-2 puffs into the lungs in the morning and at bedtime. 36 each 2   furosemide (LASIX) 20 MG tablet furosemide 20 mg tablet     hydrocortisone 2.5 % ointment Apply topically 2 (two) times daily.     ketoconazole (NIZORAL) 2 % cream Apply topically daily.      levothyroxine (SYNTHROID) 50 MCG tablet TAKE 1 TABLET EVERY DAY IN THE MORNING 90 tablet 0  metoprolol tartrate (LOPRESSOR) 25 MG tablet TAKE 1 TABLET BY MOUTH TWICE A DAY 180 tablet 1   metroNIDAZOLE (METROCREAM) 0.75 % cream Apply 1 application topically 2 (two) times daily.     montelukast (SINGULAIR) 10 MG tablet TAKE 1 TABLET BY MOUTH EVERYDAY AT BEDTIME 90 tablet 1   Olopatadine HCl (PATADAY) 0.2 % SOLN Place 1 drop into both eyes daily as needed. 2.5 mL 5   omeprazole (PRILOSEC) 20 MG capsule omeprazole 20 mg capsule,delayed release     rizatriptan (MAXALT-MLT) 10 MG disintegrating tablet TAKE 1 TABLET EVERY DAY AS NEEDED FOR MIGRAINE, MAY REPEAT IN 2 HRS  IF NEEDED. MAX 2 DOSES/WK 9 tablet 2   rosuvastatin (CRESTOR) 20 MG tablet TAKE 1 TABLET BY MOUTH EVERY DAY 90 tablet 1   sertraline (ZOLOFT) 100 MG tablet Take 200 mg by mouth at bedtime.      sodium chloride (OCEAN) 0.65 % SOLN nasal spray Place 1 spray into both nostrils as needed for congestion.     torsemide (DEMADEX) 20 MG tablet Take 1 tablet (20 mg total) by mouth daily. 90 tablet 1   trihexyphenidyl (ARTANE) 2 MG tablet Take 2 mg by mouth daily.     zinc gluconate 50 MG tablet Take 50 mg by mouth daily.     zolpidem (AMBIEN) 10 MG tablet Take 10 mg by mouth at bedtime.     Azelastine HCl 0.15 % SOLN Place 1-2 sprays into both nostrils 2 (two) times daily. 12 mL 0   No current facility-administered medications on file prior to visit.     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   Hypothyroidism    Intermittent palpitations    cardiology--  dr Martinique   Interstitial cystitis    Limited jaw range of motion    s/p  bilateral TMJ surgery,  age 32s   Migraines    Moderate persistent asthma    pulmologist-- dr Halford Chessman   OA (osteoarthritis)    both knees   OSA on CPAP    per last sleep study 09/ 2017  mild OSA, AHI13/hr   PONV (postoperative nausea and vomiting)    PVC (premature ventricular contraction)    Rosacea    Sensation of pressure in bladder area    per pt intermittant   TMJ arthralgia    Urticaria    Wears glasses    White coat syndrome without hypertension     05-25-2017  per pt hx hypertension yrs ago, no issues after quitting stressful job   Allergies  Allergen Reactions   Emetrol Itching   Indomethacin Other (See Comments)    Muscle spasms Causes muscle spasms in neck   Iodinated Diagnostic Agents Itching    Flushed and Fever, itch all over   Amlodipine Swelling    Peripheral edema   Carbamazepine Other (  See Comments)    Easy and unusual bleeding    Pseudoephedrine Other (See Comments)    I fly off the walls  CANNOT SLEEP   Risperidone And Related Other (See Comments)    Stomach upset, insomnia, drooling, tremors, "jerks," sensitivity to touch.    Bupivacaine Hives   Pentazocine Other (See Comments)    Unknown reaction MADE ME "LACTATE"   Propoxyphene Itching and Nausea And Vomiting    darvocet   Sulfa Antibiotics Other (See Comments)    Unknown reaction   Tramadol Other (See Comments)    Patient can not remember   Aspirin Other (See Comments)    upset stomach, ringing in the ears   Codeine Itching and Other (See Comments)    Anything related to codeine   Etodolac Rash and Other (See Comments)   Propranolol Nausea Only and Other (See Comments)    Social History   Socioeconomic History   Marital status: Divorced    Spouse name: Not on file   Number of children: 0   Years of education: Not on file   Highest education level: Bachelor's degree (e.g., BA, AB, BS)  Occupational History   Not on file  Tobacco Use   Smoking status: Never   Smokeless tobacco: Never  Vaping Use   Vaping Use: Never used  Substance and Sexual Activity   Alcohol use: Yes    Alcohol/week: 0.0 standard drinks    Comment: once every 3-4 months   Drug use: No   Sexual activity: Not on file  Other Topics Concern   Not on file  Social History Narrative   Not on file   Social Determinants of Health   Financial Resource Strain: Low Risk    Difficulty of Paying Living Expenses: Not hard at all  Food Insecurity: No Food Insecurity    Worried About Charity fundraiser in the Last Year: Never true   [place] in the Last Year: Never true  Transportation Needs: No Transportation Needs   Lack of Transportation (Medical): No   Lack of Transportation (Non-Medical): No  Physical Activity: Inactive   Days of Exercise per Week: 0 days   Minutes of Exercise per Session: 0 min  Stress: No Stress Concern Present   Feeling of Stress : Not at all  Social Connections: Moderately Isolated   Frequency of Communication with Friends and Family: More than three times a week   Frequency of Social Gatherings with Friends and Family: Once a week   Attends Religious Services: More than 4 times per year   Active Member of Clubs or Organizations: No   Attends Archivist Meetings: Never   Marital Status: Divorced   Vitals:   08/25/20 1421 08/25/20 1509  BP: 120/76   Pulse: (!) 119 100  Resp: 16   Temp: 98.7 F (37.1 C)   SpO2: 97%    Body mass index is 35.03 kg/m.  Physical Exam Vitals and nursing note reviewed.  Constitutional:      General: She is not in acute distress.    Appearance: She is well-developed.  HENT:     Head: Normocephalic and atraumatic.  Eyes:     Conjunctiva/sclera: Conjunctivae normal.  Cardiovascular:     Rate and Rhythm: Normal rate and regular rhythm.     Heart sounds: No murmur heard.    Comments: Left DP and PT present. Pulmonary:     Effort: Pulmonary effort is normal. No respiratory distress.  Breath sounds: Normal breath sounds.  Skin:    General: Skin is warm.     Findings: Abrasion, erythema and signs of injury present.          Comments: 6 in area with erythematous, indurated,local heat medial aspect of distal leg, above medial malleolus. It is very tender. No fluctuant area appreciated.Punctuate teeth marks, some with active bleeding.  Other punctuate lesions and claw marks around ankle.  Neurological:     General: No focal deficit present.     Mental Status: She is  alert and oriented to person, place, and time.     Comments: Antalgic gait, not assisted.  Psychiatric:        Mood and Affect: Mood is anxious.        Speech: Speech normal.   ASSESSMENT AND PLAN:  [name] was seen today for animal bite.  Diagnoses and all orders for this visit:  Cat bite of left lower leg, initial encounter Area cleaned with water, covered with sterile gauze and wrapped with bandage. Recommend keeping wounds clean with soap and water twice daily. Uncovered during the day and cover at night.  Lower extremity elevation a few times throughout the day may also help with edema. Tdap given today.  For pain, she has tolerated hydrocodone in the past, so prescription for hydrocodone-acetaminophen 5-325 mg to take 3 times per day as needed given today. We discussed side effects of medication.  -     amoxicillin-clavulanate (AUGMENTIN) 875-125 MG tablet; Take 1 tablet by mouth 2 (two) times daily for 7 days. -     HYDROcodone-acetaminophen (NORCO/VICODIN) 5-325 MG tablet; Take 1 tablet by mouth every 8 (eight) hours as needed for up to 3 days for moderate pain. -     Tdap vaccine greater than or equal to 7yo IM -     cefTRIAXone (ROCEPHIN) injection 500 mg  Cellulitis of left lower extremity Here in the office and after verbal consent she received Rocephin 500 mg IM diluted in 0.9% saline sl (allergic to Lidocaine). She stayed in observation for 20 minutes, checking on her regularly, no side effects or complications after injection. We will hold on imaging for now.  She will continue with Augmentin x7 days. She was clearly instructed about warning signs. F/U in 2-3 days.  -     amoxicillin-clavulanate (AUGMENTIN) 875-125 MG tablet; Take 1 tablet by mouth 2 (two) times daily for 7 days. -     HYDROcodone-acetaminophen (NORCO/VICODIN) 5-325 MG tablet; Take 1 tablet by mouth every 8 (eight) hours as needed for up to 3 days for moderate pain. -     cefTRIAXone (ROCEPHIN)  injection 500 mg  Nausea without vomiting Felt better after drinking water. Phenergan has helped in the past. We discussed some side effects. If problem is persistent, recommend follow-up with PCP.  -     promethazine (PHENERGAN) 25 MG tablet; Take 1 tablet (25 mg total) by mouth every 8 (eight) hours as needed for up to 3 days for nausea or vomiting.  Spent 42 minutes.  During this time history was obtained and documented, examination was performed, and assessment/plan discussed.  Return in about 2 days (around 08/27/2020) for Cat bite with PCP.  Hali Balgobin G. Martinique, MD  [facility]. [place] office.   A few things to remember from today's visit:  Cat bite of left lower leg, initial encounter - Plan: amoxicillin-clavulanate (AUGMENTIN) 875-125 MG tablet, HYDROcodone-acetaminophen (NORCO/VICODIN) 5-325 MG tablet  Cellulitis of left lower extremity -  Plan: amoxicillin-clavulanate (AUGMENTIN) 875-125 MG tablet, HYDROcodone-acetaminophen (NORCO/VICODIN) 5-325 MG tablet  Nausea without vomiting - Plan: promethazine (PHENERGAN) 25 MG tablet  Today here in the office you received Rocephin 500 mg. Phenergan 25 mg 2-3 times per day as needed for nausea. Hydrocodone-Acetaminophen for pain every 8 hours as needed. You need to follow in 2-3 days,before if needed. Keep wounds uncovered during the day and and cover at night when going to bed.  Keep wounds clean with soap and water. Lower extremity elevation and ambulation.  Do not use My Chart to request refills or for acute issues that need immediate attention.   Please be sure medication list is accurate. If a new problem present, please set up appointment sooner than planned today.

## 2020-08-25 NOTE — Patient Instructions (Addendum)
A few things to remember from today's visit:  Cat bite of left lower leg, initial encounter - Plan: amoxicillin-clavulanate (AUGMENTIN) 875-125 MG tablet, HYDROcodone-acetaminophen (NORCO/VICODIN) 5-325 MG tablet  Cellulitis of left lower extremity - Plan: amoxicillin-clavulanate (AUGMENTIN) 875-125 MG tablet, HYDROcodone-acetaminophen (NORCO/VICODIN) 5-325 MG tablet  Nausea without vomiting - Plan: promethazine (PHENERGAN) 25 MG tablet  Today here in the office you received Rocephin 500 mg. Phenergan 25 mg 2-3 times per day as needed for nausea. Hydrocodone-Acetaminophen for pain every 8 hours as needed. You need to follow in 2-3 days,before if needed. Keep wounds uncovered during the day and and cover at night when going to bed.  Keep wounds clean with soap and water. Lower extremity elevation and ambulation.  Do not use My Chart to request refills or for acute issues that need immediate attention.   Please be sure medication list is accurate. If a new problem present, please set up appointment sooner than planned today.

## 2020-08-26 ENCOUNTER — Telehealth: Payer: Self-pay | Admitting: Internal Medicine

## 2020-08-26 NOTE — Telephone Encounter (Signed)
   Patient calling for advice on dressing bleeding cat bites Patient saw provider at [facility] on 7/18

## 2020-08-26 NOTE — Telephone Encounter (Signed)
If bleeding, she can apply pressure for a few minutes. If persistent , she can cover with gauze,wrap with compressive bandage and keep it on for a few hours. Follow as instructed. Thanks, BJ

## 2020-08-26 NOTE — Telephone Encounter (Signed)
I called and spoke with patient. We went over the information below and she verbalized understanding. Patient will try to set up a follow up with Dr. Ronnald Ramp for tomorrow, if he doesn't have any openings, she will call back to see Dr. Martinique tomorrow.

## 2020-08-27 ENCOUNTER — Encounter: Payer: Self-pay | Admitting: Family Medicine

## 2020-08-27 ENCOUNTER — Ambulatory Visit (INDEPENDENT_AMBULATORY_CARE_PROVIDER_SITE_OTHER): Payer: Medicare HMO | Admitting: Family Medicine

## 2020-08-27 ENCOUNTER — Other Ambulatory Visit: Payer: Self-pay

## 2020-08-27 VITALS — BP 124/80 | HR 98 | Temp 96.7°F | Resp 16 | Ht 60.0 in | Wt 179.0 lb

## 2020-08-27 DIAGNOSIS — W5501XD Bitten by cat, subsequent encounter: Secondary | ICD-10-CM

## 2020-08-27 DIAGNOSIS — S81852D Open bite, left lower leg, subsequent encounter: Secondary | ICD-10-CM | POA: Diagnosis not present

## 2020-08-27 DIAGNOSIS — L03116 Cellulitis of left lower limb: Secondary | ICD-10-CM

## 2020-08-27 DIAGNOSIS — W5501XA Bitten by cat, initial encounter: Secondary | ICD-10-CM

## 2020-08-27 MED ORDER — CEFTRIAXONE SODIUM 500 MG IJ SOLR
500.0000 mg | Freq: Once | INTRAMUSCULAR | Status: AC
  Administered 2020-08-27: 500 mg via INTRAMUSCULAR

## 2020-08-27 NOTE — Progress Notes (Signed)
Chief Complaint  Patient presents with   Follow-up   HPI: [name] is a 65 y.o. female, who is here today to follow on recent OV. Her PCP did not have available appts today. She was seen on 08/25/20 about 3 days after her cat bit her LLE. Rocephin 500 mg IM x 1 was given and started on Augmentin 875-125 mg bid. She has taken 4 tabs of Augmentin so far. Symptoms have not gotten worse, not greatly improved.  Intermittent bleeding from bite sites. She has not noted purulent drainage.  She has not noted fever but had chills yesterday. Still nauseated, which she attributes to pain. She is on Zofran 4 mg tid prn and for pain management Hydrocodone-Acetaminophen 5-325 mg tid prn was recommended. She has tolerated medications well.  Review of Systems  Constitutional:  Positive for activity change, appetite change and fatigue.  Respiratory:  Negative for cough, shortness of breath and wheezing.   Cardiovascular:  Negative for chest pain and palpitations.  Gastrointestinal:  Negative for abdominal pain and vomiting.  Genitourinary:  Negative for decreased urine volume, dysuria and hematuria.  Neurological:  Negative for syncope and weakness.  Psychiatric/Behavioral:  Negative for confusion. The patient is nervous/anxious.   Rest see pertinent positives and negatives per HPI.  Current Outpatient Medications on File Prior to Visit  Medication Sig Dispense Refill   acetaminophen (TYLENOL) 500 MG tablet Take 500 mg by mouth every 6 (six) hours as needed.     albuterol (VENTOLIN HFA) 108 (90 Base) MCG/ACT inhaler INHALE 1-2 PUFFS INTO THE LUNGS DAILY AS NEEDED FOR WHEEZING OR SHORTNESS OF BREATH. 18 each 1   amoxicillin-clavulanate (AUGMENTIN) 875-125 MG tablet Take 1 tablet by mouth 2 (two) times daily for 7 days. 14 tablet 0   Ascorbic Acid (VITAMIN C CR) 500 MG CPCR Take by mouth.     Brexpiprazole (REXULTI) 3 MG TABS Take by mouth.     Carbinoxamine Maleate 4 MG TABS Take 1  tablet (4 mg total) by mouth every 6 (six) hours. 180 tablet 1   celecoxib (CELEBREX) 200 MG capsule Take 200 mg by mouth daily.     Cinnamon 500 MG capsule Cinnamon 500 mg capsule     clonazePAM (KLONOPIN) 0.5 MG tablet Take 0.5 mg by mouth 2 (two) times daily as needed.     famotidine (PEPCID) 20 MG tablet Take 20 mg by mouth at bedtime.     fluticasone (FLONASE) 50 MCG/ACT nasal spray PLACE 1 SPRAY INTO BOTH NOSTRILS 2 (TWO) TIMES DAILY AS NEEDED FOR ALLERGIES OR RHINITIS. 48 mL 1   fluticasone (FLOVENT HFA) 220 MCG/ACT inhaler Inhale 1-2 puffs into the lungs in the morning and at bedtime. 36 each 2   furosemide (LASIX) 20 MG tablet furosemide 20 mg tablet     HYDROcodone-acetaminophen (NORCO/VICODIN) 5-325 MG tablet Take 1 tablet by mouth every 8 (eight) hours as needed for up to 3 days for moderate pain. 9 tablet 0   hydrocortisone 2.5 % ointment Apply topically 2 (two) times daily.     ketoconazole (NIZORAL) 2 % cream Apply topically daily.      levothyroxine (SYNTHROID) 50 MCG tablet TAKE 1 TABLET EVERY DAY IN THE MORNING 90 tablet 0   metoprolol tartrate (LOPRESSOR) 25 MG tablet TAKE 1 TABLET BY MOUTH TWICE A DAY 180 tablet 1   metroNIDAZOLE (METROCREAM) 0.75 % cream Apply 1 application topically 2 (two) times daily.     montelukast (SINGULAIR) 10 MG tablet TAKE  1 TABLET BY MOUTH EVERYDAY AT BEDTIME 90 tablet 1   MYRBETRIQ 50 MG TB24 tablet Take 50 mg by mouth daily.     Olopatadine HCl (PATADAY) 0.2 % SOLN Place 1 drop into both eyes daily as needed. 2.5 mL 5   omeprazole (PRILOSEC) 20 MG capsule omeprazole 20 mg capsule,delayed release     promethazine (PHENERGAN) 25 MG tablet Take 1 tablet (25 mg total) by mouth every 8 (eight) hours as needed for up to 3 days for nausea or vomiting. 9 tablet 0   rizatriptan (MAXALT-MLT) 10 MG disintegrating tablet TAKE 1 TABLET EVERY DAY AS NEEDED FOR MIGRAINE, MAY REPEAT IN 2 HRS IF NEEDED. MAX 2 DOSES/WK 9 tablet 2   rosuvastatin (CRESTOR) 20 MG  tablet TAKE 1 TABLET BY MOUTH EVERY DAY 90 tablet 1   sertraline (ZOLOFT) 100 MG tablet Take 200 mg by mouth at bedtime.      sodium chloride (OCEAN) 0.65 % SOLN nasal spray Place 1 spray into both nostrils as needed for congestion.     torsemide (DEMADEX) 20 MG tablet Take 1 tablet (20 mg total) by mouth daily. 90 tablet 1   trihexyphenidyl (ARTANE) 2 MG tablet Take 2 mg by mouth daily.     zinc gluconate 50 MG tablet Take 50 mg by mouth daily.     zolpidem (AMBIEN) 10 MG tablet Take 10 mg by mouth at bedtime.     Azelastine HCl 0.15 % SOLN Place 1-2 sprays into both nostrils 2 (two) times daily. 12 mL 0   No current facility-administered medications on file prior to visit.   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   Hypothyroidism    Intermittent palpitations    cardiology--  dr [place]   Interstitial cystitis    Limited jaw range of motion    s/p  bilateral TMJ surgery,  age 31s   Migraines    Moderate persistent asthma    pulmologist-- dr Halford Chessman   OA (osteoarthritis)    both knees   OSA on CPAP    per last sleep study 09/ 2017  mild OSA, AHI13/hr   PONV (postoperative nausea and vomiting)    PVC (premature ventricular contraction)    Rosacea    Sensation of pressure in bladder area    per pt intermittant   TMJ arthralgia    Urticaria    Wears glasses    White coat syndrome without hypertension    05-25-2017  per pt hx hypertension yrs ago, no issues after quitting  stressful job   Allergies  Allergen Reactions   Emetrol Itching   Indomethacin Other (See Comments)    Muscle spasms Causes muscle spasms in neck   Iodinated Diagnostic Agents Itching    Flushed and Fever, itch all over   Amlodipine Swelling    Peripheral edema  Carbamazepine Other (See Comments)    Easy and unusual bleeding    Pseudoephedrine Other (See Comments)    I fly off the walls  CANNOT SLEEP   Risperidone And Related Other (See Comments)    Stomach upset, insomnia, drooling, tremors, "jerks," sensitivity to touch.    Bupivacaine Hives   Pentazocine Other (See Comments)    Unknown reaction MADE ME "LACTATE"   Propoxyphene Itching and Nausea And Vomiting    darvocet   Sulfa Antibiotics Other (See Comments)    Unknown reaction   Tramadol Other (See Comments)    Patient can not remember   Aspirin Other (See Comments)    upset stomach, ringing in the ears   Codeine Itching and Other (See Comments)    Anything related to codeine   Etodolac Rash and Other (See Comments)   Propranolol Nausea Only and Other (See Comments)    Social History   Socioeconomic History   Marital status: Divorced    Spouse name: Not on file   Number of children: 0   Years of education: Not on file   Highest education level: Bachelor's degree (e.g., BA, AB, BS)  Occupational History   Not on file  Tobacco Use   Smoking status: Never   Smokeless tobacco: Never  Vaping Use   Vaping Use: Never used  Substance and Sexual Activity   Alcohol use: Yes    Alcohol/week: 0.0 standard drinks    Comment: once every 3-4 months   Drug use: No   Sexual activity: Not on file  Other Topics Concern   Not on file  Social History Narrative   Not on file   Social Determinants of Health   Financial Resource Strain: Low Risk    Difficulty of Paying Living Expenses: Not hard at all  Food Insecurity: No Food Insecurity   Worried About Charity fundraiser in the Last Year: Never true   [place] in the Last Year: Never true  Transportation Needs: No Transportation Needs   Lack of Transportation (Medical): No   Lack of Transportation (Non-Medical): No  Physical Activity: Inactive   Days of Exercise per Week: 0 days   Minutes of Exercise per Session: 0 min  Stress: No Stress Concern Present   Feeling of Stress : Not at all  Social Connections: Moderately Isolated   Frequency of Communication with Friends and Family: More than three times a week   Frequency of Social Gatherings with Friends and Family: Once a week   Attends Religious Services: More than 4 times per year   Active Member of Clubs or Organizations: No   Attends Archivist Meetings: Never   Marital Status: Divorced   Vitals:   08/27/20 1352  BP: 124/80  Pulse: 98  Resp: 16  Temp: (!) 96.7 F (35.9 C)  SpO2: 98%   Body mass index is 34.96 kg/m.  Physical Exam Vitals and nursing note reviewed.  Constitutional:      General: She is not in acute distress.    Appearance: She is well-developed.  HENT:     Head: Normocephalic and atraumatic.  Eyes:     Conjunctiva/sclera: Conjunctivae normal.  Cardiovascular:     Rate and Rhythm: Normal rate and regular rhythm.     Pulses:          Dorsalis pedis pulses are 2+ on the left side.       Posterior tibial pulses are 2+ on the left side.  Pulmonary:     Effort: Pulmonary effort is normal. No respiratory distress.  Skin:    General: Skin is warm.     Findings: Erythema present.          Comments: Superficial scratches around lateral malleolus are healing well, brown crust, no erythema. Area above meal malleolus erythematous, center induration and superficial hematoma. No active bleeding, no fluctuant area, some areas are very tender.  See pictures  Neurological:     General: No focal deficit present.     Mental Status: She is alert and oriented to person, place, and time.     Comments: Antalgic gait, not assisted.  Psychiatric:        Mood  and Affect: Mood is anxious.     Comments: Well groomed, good eye contact.    Wound with iodine/betadine on.  Wound with iodine/betadine on.  This picture was taken from her phone,taken 08/25/20.Last visit Epic app was not working.  ASSESSMENT AND PLAN:  Ms.Ryan was seen today for follow-up.  Diagnoses and all orders for this visit:  Cat bite of left lower leg, subsequent encounter -     cefTRIAXone (ROCEPHIN) injection 500 mg -     Ambulatory referral to Wound Clinic  Cellulitis of left lower extremity -     cefTRIAXone (ROCEPHIN) injection 500 mg  Problem has not greatly improved but stable otherwise. We tried to schedule appt with wound clinic but next available in 09/2020.  Explained that this type of injures may take time to completely heal. Wound was cleaned with alcohol and then with betadine x 2, a 3rd layer of betadine left on, let it dry and covered with gauze.  Area wrapped with adherent bandage,keep it for 24 hours then resume wound care as instructed.  After verbal consent  she received Rocephin 500 mg Im x 1. Observation for 20 min. Continue Augmentin 875-125 mg bid. Monitor for fever or worsening pain, in which case she was instructed to go to the ER.  Spent a total of 40 minutes in both face to face and non face to face activities for this visit on the date of this encounter. During this time history was obtained and documented, examination was performed, and assessment/plan discussed.  Return in about 2 days (around 08/29/2020).  Sumit Branham G. Martinique, MD  [facility]. [place] office.

## 2020-08-27 NOTE — Patient Instructions (Signed)
Monitor temperature at home. Continue Augmentin. Keep area covered until tomorrow. Follow up in 2 days.

## 2020-08-29 ENCOUNTER — Encounter: Payer: Self-pay | Admitting: Family Medicine

## 2020-08-29 ENCOUNTER — Ambulatory Visit (INDEPENDENT_AMBULATORY_CARE_PROVIDER_SITE_OTHER): Payer: Medicare HMO | Admitting: Family Medicine

## 2020-08-29 ENCOUNTER — Other Ambulatory Visit: Payer: Self-pay

## 2020-08-29 VITALS — BP 130/80 | HR 97 | Resp 16 | Ht 60.0 in

## 2020-08-29 DIAGNOSIS — S81852D Open bite, left lower leg, subsequent encounter: Secondary | ICD-10-CM | POA: Diagnosis not present

## 2020-08-29 DIAGNOSIS — W5501XD Bitten by cat, subsequent encounter: Secondary | ICD-10-CM | POA: Diagnosis not present

## 2020-08-29 DIAGNOSIS — L03116 Cellulitis of left lower limb: Secondary | ICD-10-CM | POA: Diagnosis not present

## 2020-08-29 MED ORDER — HYDROCODONE-ACETAMINOPHEN 5-325 MG PO TABS: 1.0000 | ORAL_TABLET | Freq: Every evening | ORAL | 0 refills | Status: AC | PRN

## 2020-08-29 NOTE — Progress Notes (Signed)
HPI:  [name] is a 65 y.o. female, who is here today to follow on recent OV. She was seen on 08/27/20. Seen on 08/25/2020 3 days after cat bite of left lower extremity.  Started on Augmentin 875-125 mg twice daily and given Rocephin 500 mg IM x1. Last visit Rocephin 500 mg IM x1 was repeated. Wound was clean, iodine applied.  She received a call from wound clinic and was told that the next available appt is in 3 weeks.  She has not had fever or chills. She is still has some intermittent oozing from some of the wound but it has greatly improved. She has also noted serous yellowish drainage from some of the wounds. + Fatigue. She is still having intermittent episodes of nausea, which she attributes to pain, even though pain has improved. Negative for abdominal pain, vomiting, changes in bowel habits.  She is taking Hydrocodone-Acetaminophen 5-325 mg mainly at bedtime. She has tolerated medication well, no side effects.  Review of Systems  Constitutional:  Positive for appetite change. Negative for diaphoresis.  Respiratory:  Negative for cough, shortness of breath and wheezing.   Cardiovascular:  Negative for chest pain and palpitations.  Neurological:  Negative for syncope and weakness.  Psychiatric/Behavioral:  Negative for confusion. The patient is nervous/anxious.   Rest see pertinent positives and negatives per HPI.  Current Outpatient Medications on File Prior to Visit  Medication Sig Dispense Refill   acetaminophen (TYLENOL) 500 MG tablet Take 500 mg by mouth every 6 (six) hours as needed.     albuterol (VENTOLIN HFA) 108 (90 Base) MCG/ACT inhaler INHALE 1-2 PUFFS INTO THE LUNGS DAILY AS NEEDED FOR WHEEZING OR SHORTNESS OF BREATH. 18 each 1   amoxicillin-clavulanate (AUGMENTIN) 875-125 MG tablet Take 1 tablet by mouth 2 (two) times daily for 7 days. 14 tablet 0   Ascorbic Acid (VITAMIN C CR) 500 MG CPCR Take by mouth.     Brexpiprazole (REXULTI) 3 MG TABS Take by  mouth.     Carbinoxamine Maleate 4 MG TABS Take 1 tablet (4 mg total) by mouth every 6 (six) hours. 180 tablet 1   celecoxib (CELEBREX) 200 MG capsule Take 200 mg by mouth daily.     Cinnamon 500 MG capsule Cinnamon 500 mg capsule     clonazePAM (KLONOPIN) 0.5 MG tablet Take 0.5 mg by mouth 2 (two) times daily as needed.     famotidine (PEPCID) 20 MG tablet Take 20 mg by mouth at bedtime.     fluticasone (FLONASE) 50 MCG/ACT nasal spray PLACE 1 SPRAY INTO BOTH NOSTRILS 2 (TWO) TIMES DAILY AS NEEDED FOR ALLERGIES OR RHINITIS. 48 mL 1   fluticasone (FLOVENT HFA) 220 MCG/ACT inhaler Inhale 1-2 puffs into the lungs in the morning and at bedtime. 36 each 2   furosemide (LASIX) 20 MG tablet furosemide 20 mg tablet     hydrocortisone 2.5 % ointment Apply topically 2 (two) times daily.     ketoconazole (NIZORAL) 2 % cream Apply topically daily.      levothyroxine (SYNTHROID) 50 MCG tablet TAKE 1 TABLET EVERY DAY IN THE MORNING 90 tablet 0   metoprolol tartrate (LOPRESSOR) 25 MG tablet TAKE 1 TABLET BY MOUTH TWICE A DAY 180 tablet 1   metroNIDAZOLE (METROCREAM) 0.75 % cream Apply 1 application topically 2 (two) times daily.     montelukast (SINGULAIR) 10 MG tablet TAKE 1 TABLET BY MOUTH EVERYDAY AT BEDTIME 90 tablet 1   MYRBETRIQ 50 MG TB24 tablet  Take 50 mg by mouth daily.     Olopatadine HCl (PATADAY) 0.2 % SOLN Place 1 drop into both eyes daily as needed. 2.5 mL 5   omeprazole (PRILOSEC) 20 MG capsule omeprazole 20 mg capsule,delayed release     rizatriptan (MAXALT-MLT) 10 MG disintegrating tablet TAKE 1 TABLET EVERY DAY AS NEEDED FOR MIGRAINE, MAY REPEAT IN 2 HRS IF NEEDED. MAX 2 DOSES/WK 9 tablet 2   rosuvastatin (CRESTOR) 20 MG tablet TAKE 1 TABLET BY MOUTH EVERY DAY 90 tablet 1   sertraline (ZOLOFT) 100 MG tablet Take 200 mg by mouth at bedtime.      sodium chloride (OCEAN) 0.65 % SOLN nasal spray Place 1 spray into both nostrils as needed for congestion.     torsemide (DEMADEX) 20 MG tablet Take  1 tablet (20 mg total) by mouth daily. 90 tablet 1   trihexyphenidyl (ARTANE) 2 MG tablet Take 2 mg by mouth daily.     zinc gluconate 50 MG tablet Take 50 mg by mouth daily.     zolpidem (AMBIEN) 10 MG tablet Take 10 mg by mouth at bedtime.     Azelastine HCl 0.15 % SOLN Place 1-2 sprays into both nostrils 2 (two) times daily. 12 mL 0   promethazine (PHENERGAN) 25 MG tablet Take 1 tablet (25 mg total) by mouth every 8 (eight) hours as needed for up to 3 days for nausea or vomiting. 9 tablet 0   No current facility-administered medications on file prior to visit.    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   Hypothyroidism    Intermittent palpitations    cardiology--  dr [place]   Interstitial cystitis    Limited jaw range of motion    s/p  bilateral TMJ surgery,  age 39s   Migraines    Moderate persistent asthma    pulmologist-- dr Halford Chessman   OA (osteoarthritis)    both knees   OSA on CPAP    per last sleep study 09/ 2017  mild OSA, AHI13/hr   PONV (postoperative nausea and vomiting)    PVC (premature ventricular contraction)    Rosacea    Sensation of pressure in bladder area    per pt intermittant   TMJ arthralgia    Urticaria    Wears glasses    White coat syndrome without hypertension    05-25-2017  per pt hx hypertension yrs ago, no issues after quitting stressful job   Allergies  Allergen Reactions   Emetrol Itching   Indomethacin Other (See Comments)     Muscle spasms Causes muscle spasms in neck   Iodinated Diagnostic Agents Itching    Flushed and Fever, itch all over   Amlodipine Swelling    Peripheral edema   Carbamazepine Other (See Comments)    Easy and unusual bleeding  Pseudoephedrine Other (See Comments)    I fly off the walls  CANNOT SLEEP   Risperidone And Related Other (See Comments)    Stomach upset, insomnia, drooling, tremors, "jerks," sensitivity to touch.    Bupivacaine Hives   Pentazocine Other (See Comments)    Unknown reaction MADE ME "LACTATE"   Propoxyphene Itching and Nausea And Vomiting    darvocet   Sulfa Antibiotics Other (See Comments)    Unknown reaction   Tramadol Other (See Comments)    Patient can not remember   Aspirin Other (See Comments)    upset stomach, ringing in the ears   Codeine Itching and Other (See Comments)    Anything related to codeine   Etodolac Rash and Other (See Comments)   Propranolol Nausea Only and Other (See Comments)    Social History   Socioeconomic History   Marital status: Divorced    Spouse name: Not on file   Number of children: 0   Years of education: Not on file   Highest education level: Bachelor's degree (e.g., BA, AB, BS)  Occupational History   Not on file  Tobacco Use   Smoking status: Never   Smokeless tobacco: Never  Vaping Use   Vaping Use: Never used  Substance and Sexual Activity   Alcohol use: Yes    Alcohol/week: 0.0 standard drinks    Comment: once every 3-4 months   Drug use: No   Sexual activity: Not on file  Other Topics Concern   Not on file  Social History Narrative   Not on file   Social Determinants of Health   Financial Resource Strain: Low Risk    Difficulty of Paying Living Expenses: Not hard at all  Food Insecurity: No Food Insecurity   Worried About Charity fundraiser in the Last Year: Never true   [place] in the Last Year: Never true  Transportation Needs: No Transportation Needs   Lack of Transportation  (Medical): No   Lack of Transportation (Non-Medical): No  Physical Activity: Inactive   Days of Exercise per Week: 0 days   Minutes of Exercise per Session: 0 min  Stress: No Stress Concern Present   Feeling of Stress : Not at all  Social Connections: Moderately Isolated   Frequency of Communication with Friends and Family: More than three times a week   Frequency of Social Gatherings with Friends and Family: Once a week   Attends Religious Services: More than 4 times per year   Active Member of [employer] or Organizations: No   Attends Archivist Meetings: Never   Marital Status: Divorced   Vitals:   08/29/20 1518  BP: 130/80  Pulse: 97  Resp: 16  SpO2: 97%   Body mass index is 34.96 kg/m.  Physical Exam Vitals and nursing note reviewed.  Constitutional:      General: She is not in acute distress.    Appearance: She is well-developed.  HENT:     Head: Normocephalic and atraumatic.  Eyes:     Conjunctiva/sclera: Conjunctivae normal.  Cardiovascular:     Rate and Rhythm: Normal rate and regular rhythm.     Pulses:          Dorsalis pedis pulses are 2+ on the left side.       Posterior tibial pulses are 2+ on the left side.  Pulmonary:     Effort: Pulmonary effort is normal. No respiratory distress.  Skin:    General: Skin is  warm.     Findings: Erythema present.          Comments: Medial aspect of distal LLE with erythema, induration, and local heat. Erythema extension has decreased. Some of wound are healing well. No purulent drainage noted. Area of induration has also decreased in diameter, there is a central fluctuant area that seems to be a small hematoma.  Neurological:     Mental Status: She is alert and oriented to person, place, and time.  Psychiatric:     Comments: Well groomed, good eye contact.       ASSESSMENT AND PLAN:  Ms.Mikeria was seen today for follow-up.  Diagnoses and all orders for this visit:  Cat bite of left lower leg,  subsequent encounter For pain management continue hydrocodone-acetaminophen 3-325 mg at bedtime. Continue keeping wounds clean with soap and water. Today I cleaned area with alcohol and apply iodine,covered with nonstick sterile bandage + adhesive bandage.  Instructed to keep bandage overnight. She tolerated the procedure well. Schedule appt with wound clinic.  Hydrocodone-acetaminophen could be contributing to nausea.  Monitor for new GI symptoms.  Cellulitis of left lower extremity I noted some improvement today. Continue Augmentin 875-125 mg twice daily. Recommend warm compresses with Epson salt and warm water for 10 to 15 minutes every 1 to 2 days. Continue LE elevation. Monitor for worsening pain and fever. Instructed about warning signs.  -     HYDROcodone-acetaminophen (NORCO/VICODIN) 5-325 MG tablet; Take 1 tablet by mouth at bedtime as needed for up to 7 days for moderate pain.  I spent a total of 34 minutes in both face to face and non face to face activities for this visit on the date of this encounter. During this time history was obtained and documented, examination was performed, and assessment/plan discussed.  Return in about 5 days (around 09/03/2020).   Sharian Delia G. [place], MD  [facility]. [place] office.

## 2020-08-29 NOTE — Patient Instructions (Addendum)
A few things to remember from today's visit:   Cat bite of left lower leg, subsequent encounter  Cellulitis of left lower extremity  Slowly healing. Continue antibiotic treatment. Hydrocodone-Acetaminophen at bedtime. Monitor for fever or worsening pain. Start soaking area in warm water with Epson salt for 10-15 min.  Do not use My Chart to request refills or for acute issues that need immediate attention.    Please be sure medication list is accurate. If a new problem present, please set up appointment sooner than planned today.

## 2020-09-01 ENCOUNTER — Encounter (HOSPITAL_COMMUNITY): Payer: Self-pay | Admitting: *Deleted

## 2020-09-01 ENCOUNTER — Other Ambulatory Visit: Payer: Self-pay

## 2020-09-01 ENCOUNTER — Telehealth: Payer: Self-pay

## 2020-09-01 ENCOUNTER — Ambulatory Visit (HOSPITAL_COMMUNITY)
Admission: EM | Admit: 2020-09-01 | Discharge: 2020-09-01 | Disposition: A | Discharge status: Home / Self Care | Payer: Medicare HMO | Attending: Student | Admitting: Student

## 2020-09-01 ENCOUNTER — Other Ambulatory Visit: Payer: Self-pay | Admitting: Allergy & Immunology

## 2020-09-01 DIAGNOSIS — S91052A Open bite, left ankle, initial encounter: Secondary | ICD-10-CM

## 2020-09-01 DIAGNOSIS — W5501XA Bitten by cat, initial encounter: Secondary | ICD-10-CM | POA: Diagnosis not present

## 2020-09-01 DIAGNOSIS — L03116 Cellulitis of left lower limb: Secondary | ICD-10-CM | POA: Diagnosis not present

## 2020-09-01 MED ORDER — DOXYCYCLINE HYCLATE 100 MG PO CAPS: 100.0000 mg | ORAL_CAPSULE | Freq: Two times a day (BID) | ORAL | 0 refills | Status: AC

## 2020-09-01 NOTE — Telephone Encounter (Signed)
Pt scheduled for 7/27 @ 11.20am

## 2020-09-01 NOTE — ED Provider Notes (Signed)
[place]    CSN: [id number] Arrival date & time: 09/01/20  1116      History   Chief Complaint Chief Complaint  Patient presents with   Animal Bite    HPI [name] is a 65 y.o. female presenting with cat bite x1 week.  Medical history extensive as below.  This patient is presenting for a cat bite that occurred 1 week ago, she has been seen by her primary care 3 times for this and has been prescribed Augmentin, hydrocodone, and been given 1 g of Rocephin.  States that she finished the Augmentin 1 day ago, and is concerned that the area of redness and swelling is only increasing.  Endorses pain that is poorly controlled on Tylenol alone.  Denies fever/chills, discharge from the wound.  HPI  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   Hypothyroidism    Intermittent palpitations    cardiology--  dr Martinique   Interstitial cystitis    Limited jaw range of motion    s/p  bilateral TMJ surgery,  age 89s   Migraines    Moderate persistent asthma    pulmologist-- dr Halford Chessman   OA (osteoarthritis)    both knees   OSA on CPAP    per last sleep study 09/ 2017  mild OSA, AHI13/hr   PONV (postoperative nausea and vomiting)    PVC (premature ventricular contraction)    Rosacea    Sensation of pressure in bladder area    per pt intermittant   TMJ arthralgia    Urticaria    Wears glasses    White coat syndrome without hypertension    05-25-2017  per pt hx  hypertension yrs ago, no issues after quitting stressful job    Patient Active Problem List   Diagnosis Date Noted   Cough 08/01/2020   Viral URI 06/17/2020   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   Right hand pain 04/11/2020   Bilateral leg edema 03/12/2020   Grade I diastolic dysfunction [id number]   Type 2 diabetes mellitus without complication, without long-term current use of insulin ([place]) 03/12/2020   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   Iron deficiency anemia 04/28/2018   Ambulatory dysfunction 01/16/2018   Serotonin syndrome 01/16/2018   OA (osteoarthritis) of knee 12/26/2017   PVC (premature ventricular contraction) 11/21/2017   Dysphonia 09/26/2017   Abnormal liver function test 04/04/2017   Vitamin D deficiency, unspecified 10/13/2016   Hyperlipidemia 09/07/2016   Osteoporosis without current pathological fracture 03/22/2016   Paraesophageal hiatal hernia s/p robotic repair & fundoplication [id number]   Bipolar 1 disorder, mixed, moderate ([place]) 01/29/2016   Class 1 obesity with body mass index (BMI) of 32.0 to 32.9 in adult 01/06/2016   History of skin cancer 03/10/2015   Bilateral knee pain 12/06/2014   Migraines 11/21/2014  Increased frequency of urination 05/30/2013   Tremor 02/08/2012   OTH D/O MENSTRUATION&OTH ABN BLEED FE GNT TRACT 12/01/2007   OSA on CPAP 01/30/2007   GERD 11/02/2006   DERMATITIS, HANDS 11/02/2006   Hypothyroidism 10/07/2006   Bipolar affective ([place]) 10/07/2006   Essential hypertension 10/07/2006   RHINITIS, CHRONIC 10/07/2006   Seasonal and perennial allergic rhinitis 10/07/2006   Moderate persistent asthma, uncomplicated [id number]   Irritable bowel syndrome 10/07/2006   INTERSTITIAL CYSTITIS 10/07/2006   ROSACEA 10/07/2006   Osteoarthritis of both knees 10/07/2006   Disturbance in sleep behavior 10/07/2006   Personal history of other malignant neoplasm of skin 10/07/2006    Past Surgical History:   Procedure Laterality Date   16 HOUR [place] STUDY N/A 09/22/2015   Procedure: 24 HOUR PH STUDY;  Surgeon: Doran Stabler, MD;  Location: [facility];  Service: Gastroenterology;  Laterality: N/A;   ADENOIDECTOMY     ANAL FISSURE REPAIR  age 75  (61)   BREAST EXCISIONAL BIOPSY Right 04-16-2003  dr p. young  [facility]   benign   BUNIONECTOMY Right early 2000s   CARPAL TUNNEL RELEASE Left age 89 (12)   CHOLECYSTECTOMY OPEN  age 89   CYSTO WITH HYDRODISTENSION N/A 06/02/2017   Procedure: CYSTOSCOPY/HYDRODISTENSION OF BLADDER;  Surgeon: Irine Seal, MD;  Location: [facility];  Service: Urology;  Laterality: N/A;   CYSTO/ HYDRODISTENTION/  INSTILLATION THERAPY  1990s   DX LAPAROSCOPY/  DX HYSTEROSCOPY/  D & C  08-07-2007   dr dove  [place]  10-21-2008   dr Harolyn Rutherford  [facility]   ESOPHAGEAL MANOMETRY N/A 09/22/2015   Procedure: ESOPHAGEAL MANOMETRY (EM);  Surgeon: Doran Stabler, MD;  Location: [facility];  Service: Gastroenterology;  Laterality: N/A;  with impedence    FINGER ARTHROPLASTY Bilateral    left little finger 2016:  right middle finger 06/ 2018   KNEE ARTHROSCOPY Bilateral x3 left ;  x3  right-- last one age 65 (60)   NASAL SEPTUM SURGERY  age 87s   ROBOT ASSISTED REDUCTION PARAESOPHAGEAL HIATAL HERNIA/ TYPE 2 MEDIASTINAL DISSECTION/ PRIMARY HIATAL HERNIA REPAIR/ ANTERIOR & POSTERIOR GASTROPEXY/ NISSEN FUNDOPLICATION  0000000   DR GROSS  [facility]   SINOSCOPY     TEMPOROMANDIBULAR JOINT SURGERY Bilateral x3  -- age 16s   per pt used graft   TONSILLECTOMY     TOTAL KNEE ARTHROPLASTY Left 12/26/2017   Procedure: LEFT TOTAL KNEE ARTHROPLASTY;  Surgeon: Gaynelle Arabian, MD;  Location: [facility];  Service: Orthopedics;  Laterality: Left;  110mn   TRANSTHORACIC ECHOCARDIOGRAM  12-06-2017   dr jMartinique  ef 55-60%, grade 1 diastoilc dysfunction, trivial MR   TRIGGER FINGER RELEASE Bilateral last one 2017   several release's bilaterally   ULNAR NERVE  TRANSPOSITION Bilateral age 65((36    OB History     Gravida  0   Para      Term      Preterm      AB      Living         SAB      IAB      Ectopic      Multiple      Live Births               Home Medications    Prior to Admission medications   Medication Sig Start Date End Date Taking? Authorizing Provider  doxycycline (VIBRAMYCIN) 100 MG capsule Take  1 capsule (100 mg total) by mouth 2 (two) times daily for 7 days. 09/01/20 09/08/20 Yes Hazel Sams, PA-C  acetaminophen (TYLENOL) 500 MG tablet Take 500 mg by mouth every 6 (six) hours as needed.    [provider]  albuterol (VENTOLIN HFA) 108 (90 Base) MCG/ACT inhaler INHALE 1-2 PUFFS INTO THE LUNGS DAILY AS NEEDED FOR WHEEZING OR SHORTNESS OF BREATH. 07/15/20   Valentina Shaggy, MD  Ascorbic Acid (VITAMIN C CR) 500 MG CPCR Take by mouth.    [provider]  Azelastine HCl 0.15 % SOLN Place 1-2 sprays into both nostrils 2 (two) times daily. 04/02/20 05/02/20  Valentina Shaggy, MD  Brexpiprazole (REXULTI) 3 MG TABS Take by mouth.    [provider]  Carbinoxamine Maleate 4 MG TABS Take 1 tablet (4 mg total) by mouth every 6 (six) hours. 06/12/20   Valentina Shaggy, MD  celecoxib (CELEBREX) 200 MG capsule Take 200 mg by mouth daily. 04/08/20   [provider]  Cinnamon 500 MG capsule Cinnamon 500 mg capsule    [provider]  clonazePAM (KLONOPIN) 0.5 MG tablet Take 0.5 mg by mouth 2 (two) times daily as needed. 08/20/20   [provider]  famotidine (PEPCID) 20 MG tablet Take 20 mg by mouth at bedtime. 10/24/18   [provider]  fluticasone (FLONASE) 50 MCG/ACT nasal spray PLACE 1 SPRAY INTO BOTH NOSTRILS 2 (TWO) TIMES DAILY AS NEEDED FOR ALLERGIES OR RHINITIS. 07/08/20   Valentina Shaggy, MD  fluticasone (FLOVENT HFA) 220 MCG/ACT inhaler Inhale 1-2 puffs into the lungs in the morning and at bedtime. 11/27/19   Valentina Shaggy, MD   furosemide (LASIX) 20 MG tablet furosemide 20 mg tablet    [provider]  HYDROcodone-acetaminophen (NORCO/VICODIN) 5-325 MG tablet Take 1 tablet by mouth at bedtime as needed for up to 7 days for moderate pain. 08/29/20 09/05/20  Martinique, Betty G, MD  hydrocortisone 2.5 % ointment Apply topically 2 (two) times daily.    [provider]  ketoconazole (NIZORAL) 2 % cream Apply topically daily.  10/09/18   [provider]  levothyroxine (SYNTHROID) 50 MCG tablet TAKE 1 TABLET EVERY DAY IN THE MORNING 07/04/20   Janith Lima, MD  metoprolol tartrate (LOPRESSOR) 25 MG tablet TAKE 1 TABLET BY MOUTH TWICE A DAY 07/17/20   Janith Lima, MD  metroNIDAZOLE (METROCREAM) 0.75 % cream Apply 1 application topically 2 (two) times daily.    [provider]  montelukast (SINGULAIR) 10 MG tablet TAKE 1 TABLET BY MOUTH EVERYDAY AT BEDTIME 06/12/20   Valentina Shaggy, MD  MYRBETRIQ 50 MG TB24 tablet Take 50 mg by mouth daily. 08/09/20   [provider]  Olopatadine HCl (PATADAY) 0.2 % SOLN Place 1 drop into both eyes daily as needed. 06/25/19   Bobbitt, Sedalia Muta, MD  omeprazole (PRILOSEC) 20 MG capsule omeprazole 20 mg capsule,delayed release    [provider]  promethazine (PHENERGAN) 25 MG tablet Take 1 tablet (25 mg total) by mouth every 8 (eight) hours as needed for up to 3 days for nausea or vomiting. 08/25/20 08/28/20  Martinique, Betty G, MD  rizatriptan (MAXALT-MLT) 10 MG disintegrating tablet TAKE 1 TABLET EVERY DAY AS NEEDED FOR MIGRAINE, MAY REPEAT IN 2 HRS IF NEEDED. MAX 2 DOSES/WK 01/07/20   Martinique, Betty G, MD  rosuvastatin (CRESTOR) 20 MG tablet TAKE 1 TABLET BY MOUTH EVERY DAY 04/09/20   Janith Lima, MD  sertraline (  ZOLOFT) 100 MG tablet Take 200 mg by mouth at bedtime.     [provider]  sodium chloride (OCEAN) 0.65 % SOLN nasal spray Place 1 spray into both nostrils as needed for congestion.    [provider]  torsemide  (DEMADEX) 20 MG tablet Take 1 tablet (20 mg total) by mouth daily. 03/12/20   Janith Lima, MD  trihexyphenidyl (ARTANE) 2 MG tablet Take 2 mg by mouth daily.    [provider]  zinc gluconate 50 MG tablet Take 50 mg by mouth daily.    [provider]  zolpidem (AMBIEN) 10 MG tablet Take 10 mg by mouth at bedtime. 09/03/19   [provider]    Family History Family History  Problem Relation Age of Onset   Hypertension Mother        deceased from MVA complications   Thyroid disease Mother    Allergic rhinitis Mother    Testicular cancer Father    Allergic rhinitis Father    Colon polyps Sister    Coronary artery disease Other    Healthy Brother    Healthy Sister     Social History Social History   Tobacco Use   Smoking status: Never   Smokeless tobacco: Never  Vaping Use   Vaping Use: Never used  Substance Use Topics   Alcohol use: Yes    Alcohol/week: 0.0 standard drinks    Comment: once every 3-4 months   Drug use: No     Allergies   Emetrol, Indomethacin, Iodinated diagnostic agents, Amlodipine, Carbamazepine, Pseudoephedrine, Risperidone and related, Bupivacaine, Pentazocine, Propoxyphene, Sulfa antibiotics, Tramadol, Aspirin, Codeine, Etodolac, and Propranolol   Review of Systems Review of Systems  Skin:  Positive for color change.  All other systems reviewed and are negative.   Physical Exam Triage Vital Signs ED Triage Vitals  Enc Vitals Group     BP 09/01/20 1358 122/67     Pulse Rate 09/01/20 1358 82     Resp 09/01/20 1358 20     Temp 09/01/20 1358 99 F (37.2 C)     Temp src --      SpO2 09/01/20 1358 99 %     Weight --      Height --      Head Circumference --      Peak Flow --      Pain Score 09/01/20 1359 7     Pain Loc --      Pain Edu? --      Excl. in [place]? --    No data found.  Updated Vital Signs BP 122/67   Pulse 82   Temp 99 F (37.2 C)   Resp 20   LMP 03/05/2012 (Approximate)   SpO2 99%    Visual Acuity Right Eye Distance:   Left Eye Distance:   Bilateral Distance:    Right Eye Near:   Left Eye Near:    Bilateral Near:     Physical Exam Vitals reviewed.  Constitutional:      General: She is not in acute distress.    Appearance: Normal appearance. She is not ill-appearing or diaphoretic.  HENT:     Head: Normocephalic and atraumatic.  Cardiovascular:     Rate and Rhythm: Normal rate and regular rhythm.     Heart sounds: Normal heart sounds.  Pulmonary:     Effort: Pulmonary effort is normal.     Breath sounds: Normal breath sounds.  Skin:    General: Skin  is warm.     Comments: L ankle- medial aspect with erythema, induration, and localized heat- comparable to photo from PCP note 08/29/20. Area of erythema seems to be similar size. No purulent drainage or fluctuance. There is some central induration. DP 2+.    Neurological:     General: No focal deficit present.     Mental Status: She is alert and oriented to person, place, and time.  Psychiatric:        Mood and Affect: Mood normal.        Behavior: Behavior normal.        Thought Content: Thought content normal.        Judgment: Judgment normal.     UC Treatments / Results  Labs (all labs ordered are listed, but only abnormal results are displayed) Labs Reviewed - No data to display  EKG   Radiology No results found.  Procedures Procedures (including critical care time)  Medications Ordered in UC Medications - No data to display  Initial Impression / Assessment and Plan / UC Course  I have reviewed the triage vital signs and the nursing notes.  Pertinent labs & imaging results that were available during my care of the patient were reviewed by me and considered in my medical decision making (see chart for details).     This patient is a very pleasant 65 y.o. year old female presenting with cellulitis L ankle due to cat bite. Afebrile, nontachy. Has already completed augmentin and rocephin  as prescribed by PCP. Doxycycline as below. Wound care discussed. F/u with PCP for recheck.. she has appt with wound center in 3 weeks. ED return precautions discussed. Patient verbalizes understanding and agreement. Tdap UTD and cat bite has already been reported appropriately.   Final Clinical Impressions(s) / UC Diagnoses   Final diagnoses:  Cat bite of ankle, left, initial encounter  Cellulitis of left ankle     Discharge Instructions      -Doxycycline twice daily for 7 days.  Make sure to wear sunscreen while spending time outside while on this medication as it can increase your chance of sunburn. You can take this medication with food if you have a sensitive stomach. -Continue wound care as provided by PCP -Follow-up with PCP for recheck in 2-3 days. You can ask PCP for refill on your hydrocodone.  -If symptoms are getting worse, like redness increases in size, increasing discharge from the wound, new fevers/chills- you may need to go to the ED for IV antibiotics.      ED Prescriptions     Medication Sig Dispense Auth. Provider   doxycycline (VIBRAMYCIN) 100 MG capsule Take 1 capsule (100 mg total) by mouth 2 (two) times daily for 7 days. 14 capsule Hazel Sams, PA-C      PDMP not reviewed this encounter.   Hazel Sams, PA-C 09/01/20 1439

## 2020-09-01 NOTE — Telephone Encounter (Signed)
Please advise as pt has stated she is in need to see Dr. Ronnald Ramp for a f/u of her cat bite wounds. Pt states Dr. Martinique had reached out to inform the office that pt is to be seen this week by her PCP due to the matter.  ** Pt can reached at [phone]

## 2020-09-01 NOTE — ED Triage Notes (Signed)
Pt reports cat bite over 1 week ago. Pt saw PCP last week . Pt now has redness and swelling to LT lower leg.

## 2020-09-01 NOTE — Discharge Instructions (Addendum)
-  Doxycycline twice daily for 7 days.  Make sure to wear sunscreen while spending time outside while on this medication as it can increase your chance of sunburn. You can take this medication with food if you have a sensitive stomach. -Continue wound care as provided by PCP -Follow-up with PCP for recheck in 2-3 days. You can ask PCP for refill on your hydrocodone.  -If symptoms are getting worse, like redness increases in size, increasing discharge from the wound, new fevers/chills- you may need to go to the ED for IV antibiotics.

## 2020-09-01 NOTE — Telephone Encounter (Signed)
07/14/20 an albuterol inhaler was sent in by Dr. Ernst Bowler with 1 refill.

## 2020-09-03 ENCOUNTER — Ambulatory Visit (INDEPENDENT_AMBULATORY_CARE_PROVIDER_SITE_OTHER): Payer: Medicare HMO | Admitting: Internal Medicine

## 2020-09-03 ENCOUNTER — Other Ambulatory Visit: Payer: Self-pay

## 2020-09-03 ENCOUNTER — Ambulatory Visit (INDEPENDENT_AMBULATORY_CARE_PROVIDER_SITE_OTHER): Payer: Medicare HMO

## 2020-09-03 ENCOUNTER — Encounter: Payer: Self-pay | Admitting: Internal Medicine

## 2020-09-03 VITALS — BP 106/66 | HR 81 | Temp 98.5°F | Ht 60.0 in | Wt 179.0 lb

## 2020-09-03 DIAGNOSIS — W5501XA Bitten by cat, initial encounter: Secondary | ICD-10-CM | POA: Insufficient documentation

## 2020-09-03 DIAGNOSIS — W5501XD Bitten by cat, subsequent encounter: Secondary | ICD-10-CM | POA: Diagnosis not present

## 2020-09-03 DIAGNOSIS — L089 Local infection of the skin and subcutaneous tissue, unspecified: Secondary | ICD-10-CM | POA: Insufficient documentation

## 2020-09-03 DIAGNOSIS — L02416 Cutaneous abscess of left lower limb: Secondary | ICD-10-CM

## 2020-09-03 DIAGNOSIS — S81852D Open bite, left lower leg, subsequent encounter: Secondary | ICD-10-CM

## 2020-09-03 DIAGNOSIS — R609 Edema, unspecified: Secondary | ICD-10-CM | POA: Diagnosis not present

## 2020-09-03 DIAGNOSIS — S81852A Open bite, left lower leg, initial encounter: Secondary | ICD-10-CM | POA: Insufficient documentation

## 2020-09-03 LAB — CBC WITH DIFFERENTIAL/PLATELET
Basophils Absolute: 0.1 10*3/uL (ref 0.0–0.1)
Basophils Relative: 0.7 % (ref 0.0–3.0)
Eosinophils Absolute: 0.1 10*3/uL (ref 0.0–0.7)
Eosinophils Relative: 1.1 % (ref 0.0–5.0)
HCT: 39.7 % (ref 36.0–46.0)
Hemoglobin: 13.5 g/dL (ref 12.0–15.0)
Lymphocytes Relative: 24.2 % (ref 12.0–46.0)
Lymphs Abs: 2.5 10*3/uL (ref 0.7–4.0)
MCHC: 34 g/dL (ref 30.0–36.0)
MCV: 85 fl (ref 78.0–100.0)
Monocytes Absolute: 0.7 10*3/uL (ref 0.1–1.0)
Monocytes Relative: 7.1 % (ref 3.0–12.0)
Neutro Abs: 7 10*3/uL (ref 1.4–7.7)
Neutrophils Relative %: 66.9 % (ref 43.0–77.0)
Platelets: 279 10*3/uL (ref 150.0–400.0)
RBC: 4.67 Mil/uL (ref 3.87–5.11)
RDW: 13.8 % (ref 11.5–15.5)
WBC: 10.4 10*3/uL (ref 4.0–10.5)

## 2020-09-03 LAB — SEDIMENTATION RATE: Sed Rate: 69 mm/hr — ABNORMAL HIGH (ref 0–30)

## 2020-09-03 NOTE — Progress Notes (Signed)
T bite  Subjective:  Patient ID: [name], female    DOB: 1955-12-29  Age: 65 y.o. MRN: [id number]  CC: Wound Infection  This visit occurred during the SARS-CoV-2 public health emergency.  Safety protocols were in place, including screening questions prior to the visit, additional usage of staff PPE, and extensive cleaning of exam room while observing appropriate contact time as indicated for disinfecting solutions.    HPI [name] presents for f/up - She sustained a cat bite left lower extremity about a week ago.  She has been treated with Rocephin, Augmentin, and doxycycline.  She tells me over the last few days the area starting to feel better with less pain, redness, and swelling.  She denies nausea, vomiting, fever, chills, or abdominal pain.  She does complain that there is some drainage from the area.  Outpatient Medications Prior to Visit  Medication Sig Dispense Refill   acetaminophen (TYLENOL) 500 MG tablet Take 500 mg by mouth every 6 (six) hours as needed.     albuterol (VENTOLIN HFA) 108 (90 Base) MCG/ACT inhaler INHALE 1-2 PUFFS INTO THE LUNGS DAILY AS NEEDED FOR WHEEZING OR SHORTNESS OF BREATH. 18 each 1   Ascorbic Acid (VITAMIN C CR) 500 MG CPCR Take by mouth.     Brexpiprazole (REXULTI) 3 MG TABS Take by mouth.     Carbinoxamine Maleate 4 MG TABS Take 1 tablet (4 mg total) by mouth every 6 (six) hours. 180 tablet 1   celecoxib (CELEBREX) 200 MG capsule Take 200 mg by mouth daily.     Cinnamon 500 MG capsule Cinnamon 500 mg capsule     clonazePAM (KLONOPIN) 0.5 MG tablet Take 0.5 mg by mouth 2 (two) times daily as needed.     doxycycline (VIBRAMYCIN) 100 MG capsule Take 1 capsule (100 mg total) by mouth 2 (two) times daily for 7 days. 14 capsule 0   famotidine (PEPCID) 20 MG tablet Take 20 mg by mouth at bedtime.     fluticasone (FLONASE) 50 MCG/ACT nasal spray PLACE 1 SPRAY INTO BOTH NOSTRILS 2 (TWO) TIMES DAILY AS NEEDED FOR ALLERGIES OR RHINITIS. 48 mL 1    fluticasone (FLOVENT HFA) 220 MCG/ACT inhaler Inhale 1-2 puffs into the lungs in the morning and at bedtime. 36 each 2   furosemide (LASIX) 20 MG tablet furosemide 20 mg tablet     HYDROcodone-acetaminophen (NORCO/VICODIN) 5-325 MG tablet Take 1 tablet by mouth at bedtime as needed for up to 7 days for moderate pain. 7 tablet 0   hydrocortisone 2.5 % ointment Apply topically 2 (two) times daily.     ketoconazole (NIZORAL) 2 % cream Apply topically daily.      levothyroxine (SYNTHROID) 50 MCG tablet TAKE 1 TABLET EVERY DAY IN THE MORNING 90 tablet 0   metoprolol tartrate (LOPRESSOR) 25 MG tablet TAKE 1 TABLET BY MOUTH TWICE A DAY 180 tablet 1   metroNIDAZOLE (METROCREAM) 0.75 % cream Apply 1 application topically 2 (two) times daily.     montelukast (SINGULAIR) 10 MG tablet TAKE 1 TABLET BY MOUTH EVERYDAY AT BEDTIME 90 tablet 1   MYRBETRIQ 50 MG TB24 tablet Take 50 mg by mouth daily.     omeprazole (PRILOSEC) 20 MG capsule omeprazole 20 mg capsule,delayed release     rizatriptan (MAXALT-MLT) 10 MG disintegrating tablet TAKE 1 TABLET EVERY DAY AS NEEDED FOR MIGRAINE, MAY REPEAT IN 2 HRS IF NEEDED. MAX 2 DOSES/WK 9 tablet 2   rosuvastatin (CRESTOR) 20 MG tablet TAKE 1  TABLET BY MOUTH EVERY DAY 90 tablet 1   sertraline (ZOLOFT) 100 MG tablet Take 200 mg by mouth at bedtime.      sodium chloride (OCEAN) 0.65 % SOLN nasal spray Place 1 spray into both nostrils as needed for congestion.     torsemide (DEMADEX) 20 MG tablet Take 1 tablet (20 mg total) by mouth daily. 90 tablet 1   trihexyphenidyl (ARTANE) 2 MG tablet Take 2 mg by mouth daily.     zinc gluconate 50 MG tablet Take 50 mg by mouth daily.     zolpidem (AMBIEN) 10 MG tablet Take 10 mg by mouth at bedtime.     Olopatadine HCl (PATADAY) 0.2 % SOLN Place 1 drop into both eyes daily as needed. 2.5 mL 5   Azelastine HCl 0.15 % SOLN Place 1-2 sprays into both nostrils 2 (two) times daily. 12 mL 0   promethazine (PHENERGAN) 25 MG tablet Take 1  tablet (25 mg total) by mouth every 8 (eight) hours as needed for up to 3 days for nausea or vomiting. 9 tablet 0   No facility-administered medications prior to visit.    ROS Review of Systems  Constitutional:  Negative for chills, fatigue and fever.  HENT: Negative.    Eyes: Negative.   Respiratory:  Negative for cough, shortness of breath and wheezing.   Cardiovascular:  Negative for chest pain, palpitations and leg swelling.  Gastrointestinal:  Negative for abdominal pain, diarrhea and nausea.  Endocrine: Negative.   Genitourinary:  Negative for difficulty urinating.  Musculoskeletal: Negative.   Skin:  Positive for color change and wound. Negative for rash.  Neurological: Negative.   Hematological:  Negative for adenopathy. Does not bruise/bleed easily.  Psychiatric/Behavioral: Negative.     Objective:  BP 106/66 (BP Location: Left Arm, Patient Position: Sitting, Cuff Size: Large)   Pulse 81   Temp 98.5 F (36.9 C) (Oral)   Ht 5' (1.524 m)   Wt 179 lb (81.2 kg)   LMP 03/05/2012 (Approximate)   SpO2 98%   BMI 34.96 kg/m   BP Readings from Last 3 Encounters:  09/03/20 106/66  09/01/20 122/67  08/29/20 130/80    Wt Readings from Last 3 Encounters:  09/03/20 179 lb (81.2 kg)  08/27/20 179 lb (81.2 kg)  08/25/20 179 lb 6 oz (81.4 kg)    Physical Exam Vitals reviewed.  Constitutional:      Appearance: She is not ill-appearing.  HENT:     Nose: Nose normal.  Eyes:     Conjunctiva/sclera: Conjunctivae normal.  Cardiovascular:     Rate and Rhythm: Normal rate and regular rhythm.     Heart sounds: No murmur heard. Pulmonary:     Effort: Pulmonary effort is normal.     Breath sounds: No stridor. No wheezing, rhonchi or rales.  Abdominal:     General: Abdomen is flat.     Palpations: There is no mass.     Tenderness: There is no abdominal tenderness. There is no guarding.  Musculoskeletal:     Cervical back: Neck supple.     Comments: Left lower extremity -  medial mid calf.  There is an area of erythema, fluctuance, faint warmth, and 2 areas that are draining serous fluid.  There is no purulent exudate.  There is no tenderness, streaking, or foreign body.  See photo.  Lymphadenopathy:     Cervical: No cervical adenopathy.  Neurological:     Mental Status: She is alert.    Lab Results  Component Value Date   WBC 10.4 09/03/2020   HGB 13.5 09/03/2020   HCT 39.7 09/03/2020   PLT 279.0 09/03/2020   GLUCOSE 120 (H) 03/12/2020   CHOL 174 09/11/2019   TRIG 89 09/11/2019   HDL 78 09/11/2019   LDLDIRECT 143.0 09/14/2016   LDLCALC 79 09/11/2019   ALT 17 09/11/2019   AST 22 09/11/2019   NA 141 03/12/2020   K 3.9 03/12/2020   CL 104 03/12/2020   CREATININE 0.85 03/12/2020   BUN 11 03/12/2020   CO2 31 03/12/2020   TSH 1.69 03/12/2020   INR 0.94 12/21/2017   HGBA1C 6.5 03/12/2020   MICROALBUR 0.9 03/12/2020    DG Tibia/Fibula Left  Result Date: 09/03/2020 CLINICAL DATA:  Cat bite of left lower leg with infection, subsequent encounter S81.852D, L08.9, W55.01XD (ICD-10-CM) Cat bite, look for gass/fb Abscess of left lower leg L02.416 (ICD-10-CM) EXAM: LEFT TIBIA AND FIBULA - 2 VIEW COMPARISON:  None. FINDINGS: There is no evidence of fracture or other focal bone lesions. Subcutaneous edema, which is greatest in the mid to lower leg. No evidence of subcutaneous gas or radiopaque foreign body. No focal lucency or erosive change of the bones to suggest osteomyelitis. Partially imaged knee arthroplasty. Mild ankle degenerative change. IMPRESSION: Subcutaneous edema, greatest in the mid to lower leg. No radiographic evidence of subcutaneous gas, radiopaque foreign body, or osteomyelitis. Electronically Signed   By: Margaretha Sheffield MD   On: 09/03/2020 12:27     Component 3 d ago   Source: ABSCESS/CELLULITS LLE, CAT BITE   Status: FINAL   Gram Stain: Few epithelial cells Many Polymorphonuclear leukocytes Many Gram negative bacilli   RESULT: Growth of  skin flora (note: Growth does not include S. aureus, beta-hemolytic Streptococci or P. aeruginosa).    Assessment & Plan:   [name] was seen today for wound infection.  Diagnoses and all orders for this visit:  Cat bite of left lower leg with infection, subsequent encounter- See below. -     WOUND CULTURE; Future -     DG Tibia/Fibula Left; Future -     WOUND CULTURE -     CBC with Differential/Platelet; Future -     Sedimentation rate; Future -     Sedimentation rate -     CBC with Differential/Platelet  Abscess of left lower leg- Plain film is negative for foreign body and there is no gas I do not think she has an anaerobic infection.  The culture is negative for significant bacteria.  I think she has a seroma but she prefers not to have this drained.  I recommended that she complete the course of antibiotics. -     WOUND CULTURE; Future -     DG Tibia/Fibula Left; Future -     WOUND CULTURE -     CBC with Differential/Platelet; Future -     Sedimentation rate; Future -     Sedimentation rate -     CBC with Differential/Platelet  I have discontinued [name]'s Olopatadine HCl and promethazine. I am also having her maintain her sertraline, famotidine, ketoconazole, acetaminophen, metroNIDAZOLE, sodium chloride, zinc gluconate, trihexyphenidyl, zolpidem, hydrocortisone, Vitamin C CR, Flovent HFA, rizatriptan, Rexulti, torsemide, Azelastine HCl, rosuvastatin, celecoxib, Cinnamon, omeprazole, furosemide, Carbinoxamine Maleate, montelukast, levothyroxine, fluticasone, albuterol, metoprolol tartrate, Myrbetriq, clonazePAM, and doxycycline.  No orders of the defined types were placed in this encounter.    Follow-up: No follow-ups on file.  [name], MD

## 2020-09-05 DIAGNOSIS — M25532 Pain in left wrist: Secondary | ICD-10-CM | POA: Diagnosis not present

## 2020-09-05 DIAGNOSIS — M180 Bilateral primary osteoarthritis of first carpometacarpal joints: Secondary | ICD-10-CM | POA: Diagnosis not present

## 2020-09-05 DIAGNOSIS — M19011 Primary osteoarthritis, right shoulder: Secondary | ICD-10-CM | POA: Diagnosis not present

## 2020-09-05 DIAGNOSIS — M654 Radial styloid tenosynovitis [de Quervain]: Secondary | ICD-10-CM | POA: Diagnosis not present

## 2020-09-05 NOTE — Telephone Encounter (Signed)
Patient calling to report cat bite is looking better. Still has some draining and bleeding When should patient follow up?  Please advise

## 2020-09-06 ENCOUNTER — Encounter: Payer: Self-pay | Admitting: Internal Medicine

## 2020-09-06 LAB — WOUND CULTURE

## 2020-09-06 NOTE — Patient Instructions (Signed)
Wound Infection A wound infection happens when tiny organisms (microorganisms) start to grow in a wound. A wound infection is most often caused by bacteria. Infection can cause the wound to break open or worsen. Wound infection needs treatment. If a wound infection is left untreated, complications can occur. Untreated wound infections may lead to an infection in the bloodstream (septicemia) or a bone infection (osteomyelitis). What are the causes? This condition is most often caused by bacteria growing in a wound. Othermicroorganisms, like yeast and fungi, can also cause wound infections. What increases the risk? The following factors may make you more likely to develop this condition: Having a weak body defense system (immune system). Having diabetes. Taking steroid medicines for a long time (chronic use). Smoking. Being an older person. Being overweight. Taking chemotherapy medicines. What are the signs or symptoms? Symptoms of this condition include: Having more redness, swelling, or pain at the wound site. Having more blood or fluid at the wound site. A bad smell coming from a wound or bandage (dressing). Having a fever. Feeling tired or fatigued. Having warmth at or around the wound. Having pus at the wound site. How is this diagnosed? This condition is diagnosed with a medical history and physical exam. You mayalso have a wound culture or blood tests or both. How is this treated? This condition is usually treated with an antibiotic medicine. The infection should improve 24-48 hours after you start antibiotics. After 24-48 hours, redness around the wound should stop spreading, and the wound should be less painful. Follow these instructions at home: Medicines Take or apply over-the-counter and prescription medicines only as told by your health care provider. If you were prescribed an antibiotic medicine, take or apply it as told by your health care provider. Do not stop using the  antibiotic even if you start to feel better. Wound care  Clean the wound each day, or as told by your health care provider. Wash the wound with mild soap and water. Rinse the wound with water to remove all soap. Pat the wound dry with a clean towel. Do not rub it. Follow instructions from your health care provider about how to take care of your wound. Make sure you: Wash your hands with soap and water before and after you change your dressing. If soap and water are not available, use hand sanitizer. Change your dressing as told by your health care provider. Leave stitches (sutures), skin glue, or adhesive strips in place if your wound has been closed. These skin closures may need to stay in place for 2 weeks or longer. If adhesive strip edges start to loosen and curl up, you may trim the loose edges. Do not remove adhesive strips completely unless your health care provider tells you to do that. Some wounds are left open to heal on their own. Check your wound every day for signs of infection. Watch for: More redness, swelling, or pain. More fluid or blood. Warmth. Pus or a bad smell.  General instructions Keep the dressing dry until your health care provider says it can be removed. Do not take baths, swim, or use a hot tub until your health care provider approves. Ask your health care provider if you may take showers. You may only be allowed to take sponge baths. Raise (elevate) the injured area above the level of your heart while you are sitting or lying down. Do not scratch or pick at the wound. Keep all follow-up visits as told by your health care provider.  This is important. Contact a health care provider if: Your pain is not controlled with medicine. You have more redness, swelling, or pain around your wound. You have more fluid or blood coming from your wound. Your wound feels warm to the touch. You have pus coming from your wound. You continue to notice a bad smell coming from your  wound or your dressing. Your wound that was closed breaks open. Get help right away if: You have a red streak going away from your wound. You have a fever. Summary A wound infection happens when tiny organisms (microorganisms) start to grow in a wound. This condition is usually treated with an antibiotic medicine. Follow instructions from your health care provider about how to take care of your wound. Contact a health care provider if your wound infection does not begin to improve in 24-48 hours, or your symptoms worsen. Keep all follow-up visits as told by your health care provider. This is important. This information is not intended to replace advice given to you by your health care provider. Make sure you discuss any questions you have with your healthcare provider. Document Revised: 09/06/2017 Document Reviewed: 09/06/2017 Elsevier Patient Education  [place].

## 2020-09-08 ENCOUNTER — Other Ambulatory Visit: Payer: Self-pay | Admitting: Internal Medicine

## 2020-09-08 ENCOUNTER — Telehealth: Payer: Self-pay

## 2020-09-08 DIAGNOSIS — T792XXA Traumatic secondary and recurrent hemorrhage and seroma, initial encounter: Secondary | ICD-10-CM | POA: Insufficient documentation

## 2020-09-08 NOTE — Telephone Encounter (Signed)
Pt has been informed that urgent referral was entered.

## 2020-09-08 NOTE — Telephone Encounter (Signed)
pt has stated she has am active wound that she was informed to f/u with wound care. However, pt has stated the wound care center can not see her until 09/30/2020.  *Pt is wanting to know what is her next steps as far as her cat bite wounds go. Pt has stated wounds are still oozing yellowish pink discharge a/w redness around the wound area. Pt has finished her ABX yesterday.

## 2020-09-09 DIAGNOSIS — R69 Illness, unspecified: Secondary | ICD-10-CM | POA: Diagnosis not present

## 2020-09-10 NOTE — Telephone Encounter (Signed)
She needs an appointment if she is using albuterol that often.  Salvatore Marvel, MD [facility]

## 2020-09-11 NOTE — Telephone Encounter (Signed)
Pt called back to inform that [facility] has not contacted her yet for the referral. Advised that the referral was put in and has been sent over.

## 2020-09-17 NOTE — Telephone Encounter (Signed)
Follow up message   Patient calling to report surgery center  called her and advised they do not do the wound care she needs, requesting referral to wound center

## 2020-09-18 NOTE — Telephone Encounter (Signed)
Pt has been scheduled for tomorrow 8/12 @ 9.50am

## 2020-09-18 NOTE — Telephone Encounter (Signed)
Patient calling back in regarding wound on leg  Patient says wound is starting to scab over but she still believes there is infection at the wound site  Would like a callback from provider or nurse or what needs to be done bc she does not feel comfortable waiting until her appt on 08.23  Followup as soon as possible [phone]

## 2020-09-19 ENCOUNTER — Encounter: Payer: Self-pay | Admitting: Internal Medicine

## 2020-09-19 ENCOUNTER — Ambulatory Visit (INDEPENDENT_AMBULATORY_CARE_PROVIDER_SITE_OTHER): Payer: Medicare HMO | Admitting: Internal Medicine

## 2020-09-19 ENCOUNTER — Other Ambulatory Visit: Payer: Self-pay

## 2020-09-19 VITALS — BP 118/78 | HR 75 | Temp 98.3°F | Resp 16 | Ht 60.0 in | Wt 179.0 lb

## 2020-09-19 DIAGNOSIS — I5189 Other ill-defined heart diseases: Secondary | ICD-10-CM | POA: Diagnosis not present

## 2020-09-19 DIAGNOSIS — L089 Local infection of the skin and subcutaneous tissue, unspecified: Secondary | ICD-10-CM

## 2020-09-19 DIAGNOSIS — W5501XD Bitten by cat, subsequent encounter: Secondary | ICD-10-CM

## 2020-09-19 DIAGNOSIS — S81852D Open bite, left lower leg, subsequent encounter: Secondary | ICD-10-CM

## 2020-09-19 DIAGNOSIS — I10 Essential (primary) hypertension: Secondary | ICD-10-CM

## 2020-09-19 MED ORDER — NUZYRA 150 MG PO TABS: 3.0000 | ORAL_TABLET | Freq: Every day | ORAL | 0 refills | Status: AC

## 2020-09-19 MED ORDER — TORSEMIDE 20 MG PO TABS: 20.0000 mg | ORAL_TABLET | Freq: Every day | ORAL | 1 refills | Status: DC

## 2020-09-19 MED ORDER — NUZYRA 150 MG PO TABS: 2.0000 | ORAL_TABLET | Freq: Every day | ORAL | 0 refills | Status: AC

## 2020-09-19 NOTE — Progress Notes (Signed)
Subjective:  Patient ID: [name], female    DOB: 1955-08-11  Age: 65 y.o. MRN: [id number]  CC: Follow-up  This visit occurred during the SARS-CoV-2 public health emergency.  Safety protocols were in place, including screening questions prior to the visit, additional usage of staff PPE, and extensive cleaning of exam room while observing appropriate contact time as indicated for disinfecting solutions.    HPI ELTA KAZAR presents for f/up -  She remains concerned about her left lower extremity.  She has completed a course of Augmentin and doxycycline.  I was concerned about a seroma. She was referred to surgery but they have not been able to see her.  She complains that the area was improving until the last few days ago  when she felt like it was becoming slightly more painful, swollen, and red.  There is 1 area that is draining fluid.  She denies nausea, vomiting, fever, chills, or diarrhea.  Outpatient Medications Prior to Visit  Medication Sig Dispense Refill   acetaminophen (TYLENOL) 500 MG tablet Take 500 mg by mouth every 6 (six) hours as needed.     albuterol (VENTOLIN HFA) 108 (90 Base) MCG/ACT inhaler INHALE 1-2 PUFFS INTO THE LUNGS DAILY AS NEEDED FOR WHEEZING OR SHORTNESS OF BREATH. 18 each 1   Ascorbic Acid (VITAMIN C CR) 500 MG CPCR Take by mouth.     Brexpiprazole (REXULTI) 3 MG TABS Take by mouth.     Carbinoxamine Maleate 4 MG TABS Take 1 tablet (4 mg total) by mouth every 6 (six) hours. 180 tablet 1   celecoxib (CELEBREX) 200 MG capsule Take 200 mg by mouth daily.     Cinnamon 500 MG capsule Cinnamon 500 mg capsule     clonazePAM (KLONOPIN) 0.5 MG tablet Take 0.5 mg by mouth 2 (two) times daily as needed.     famotidine (PEPCID) 20 MG tablet Take 20 mg by mouth at bedtime.     fluticasone (FLONASE) 50 MCG/ACT nasal spray PLACE 1 SPRAY INTO BOTH NOSTRILS 2 (TWO) TIMES DAILY AS NEEDED FOR ALLERGIES OR RHINITIS. 48 mL 1   fluticasone (FLOVENT HFA) 220 MCG/ACT  inhaler Inhale 1-2 puffs into the lungs in the morning and at bedtime. 36 each 2   hydrocortisone 2.5 % ointment Apply topically 2 (two) times daily.     ketoconazole (NIZORAL) 2 % cream Apply topically daily.      levothyroxine (SYNTHROID) 50 MCG tablet TAKE 1 TABLET EVERY DAY IN THE MORNING 90 tablet 0   metoprolol tartrate (LOPRESSOR) 25 MG tablet TAKE 1 TABLET BY MOUTH TWICE A DAY 180 tablet 1   metroNIDAZOLE (METROCREAM) 0.75 % cream Apply 1 application topically 2 (two) times daily.     montelukast (SINGULAIR) 10 MG tablet TAKE 1 TABLET BY MOUTH EVERYDAY AT BEDTIME 90 tablet 1   MYRBETRIQ 50 MG TB24 tablet Take 50 mg by mouth daily.     omeprazole (PRILOSEC) 20 MG capsule omeprazole 20 mg capsule,delayed release     rizatriptan (MAXALT-MLT) 10 MG disintegrating tablet TAKE 1 TABLET EVERY DAY AS NEEDED FOR MIGRAINE, MAY REPEAT IN 2 HRS IF NEEDED. MAX 2 DOSES/WK 9 tablet 2   rosuvastatin (CRESTOR) 20 MG tablet TAKE 1 TABLET BY MOUTH EVERY DAY 90 tablet 1   sertraline (ZOLOFT) 100 MG tablet Take 200 mg by mouth at bedtime.      sodium chloride (OCEAN) 0.65 % SOLN nasal spray Place 1 spray into both nostrils as needed for congestion.  trihexyphenidyl (ARTANE) 2 MG tablet Take 2 mg by mouth daily.     zinc gluconate 50 MG tablet Take 50 mg by mouth daily.     zolpidem (AMBIEN) 10 MG tablet Take 10 mg by mouth at bedtime.     furosemide (LASIX) 20 MG tablet furosemide 20 mg tablet     torsemide (DEMADEX) 20 MG tablet Take 1 tablet (20 mg total) by mouth daily. 90 tablet 1   Azelastine HCl 0.15 % SOLN Place 1-2 sprays into both nostrils 2 (two) times daily. 12 mL 0   No facility-administered medications prior to visit.    ROS Review of Systems  Constitutional:  Negative for diaphoresis and fatigue.  HENT: Negative.    Eyes: Negative.   Respiratory:  Negative for cough, chest tightness, shortness of breath and wheezing.   Cardiovascular:  Negative for chest pain, palpitations and leg  swelling.  Gastrointestinal:  Negative for abdominal pain, diarrhea, nausea and vomiting.  Endocrine: Negative.   Genitourinary: Negative.  Negative for difficulty urinating.  Musculoskeletal: Negative.  Negative for arthralgias and myalgias.  Skin:  Positive for color change and wound. Negative for rash.  Neurological: Negative.  Negative for dizziness, weakness and headaches.  Hematological:  Negative for adenopathy. Does not bruise/bleed easily.  Psychiatric/Behavioral: Negative.     Objective:  BP 118/78 (BP Location: Right Arm, Patient Position: Sitting, Cuff Size: Large)   Pulse 75   Temp 98.3 F (36.8 C) (Oral)   Resp 16   Ht 5' (1.524 m)   Wt 179 lb (81.2 kg)   LMP 03/05/2012 (Approximate)   SpO2 95%   BMI 34.96 kg/m   BP Readings from Last 3 Encounters:  09/19/20 118/78  09/03/20 106/66  09/01/20 122/67    Wt Readings from Last 3 Encounters:  09/19/20 179 lb (81.2 kg)  09/03/20 179 lb (81.2 kg)  08/27/20 179 lb (81.2 kg)    Physical Exam Vitals reviewed.  HENT:     Nose: Nose normal.     Mouth/Throat:     Mouth: Mucous membranes are moist.  Eyes:     Conjunctiva/sclera: Conjunctivae normal.  Cardiovascular:     Rate and Rhythm: Normal rate and regular rhythm.     Heart sounds: No murmur heard. Pulmonary:     Effort: Pulmonary effort is normal.     Breath sounds: No stridor. No wheezing, rhonchi or rales.  Abdominal:     General: Abdomen is flat. Bowel sounds are normal. There is no distension.     Palpations: Abdomen is soft. There is no hepatomegaly.  Musculoskeletal:     Cervical back: Neck supple.     Right lower leg: No edema.     Left lower leg: No edema.     Comments: LLE - There is a large area with scattered areas of scabbing.  There is a faint erythema and warmth.  There is one small central area that is open.  Culture is obtained.  There is no exudate and no deep tracking or loculations.  There is no induration, fluctuance, or streaking.   Lymphadenopathy:     Cervical: No cervical adenopathy.  Skin:    General: Skin is warm and dry.     Findings: No rash.  Neurological:     Mental Status: She is alert.    Lab Results  Component Value Date   WBC 10.4 09/03/2020   HGB 13.5 09/03/2020   HCT 39.7 09/03/2020   PLT 279.0 09/03/2020   GLUCOSE 120 (  H) 03/12/2020   CHOL 174 09/11/2019   TRIG 89 09/11/2019   HDL 78 09/11/2019   LDLDIRECT 143.0 09/14/2016   LDLCALC 79 09/11/2019   ALT 17 09/11/2019   AST 22 09/11/2019   NA 141 03/12/2020   K 3.9 03/12/2020   CL 104 03/12/2020   CREATININE 0.85 03/12/2020   BUN 11 03/12/2020   CO2 31 03/12/2020   TSH 1.69 03/12/2020   INR 0.94 12/21/2017   HGBA1C 6.5 03/12/2020   MICROALBUR 0.9 03/12/2020    No results found.  Assessment & Plan:   Anoop was seen today for follow-up.  Diagnoses and all orders for this visit:  Cat bite of left lower leg with infection, subsequent encounter- I will repeat the culture and will start a broad-spectrum antibiotic. -     Omadacycline Tosylate (NUZYRA) 150 MG TABS; Take 3 tablets by mouth daily for 1 day. -     Omadacycline Tosylate (NUZYRA) 150 MG TABS; Take 2 tablets by mouth daily for 7 days. -     WOUND CULTURE; Future -     WOUND CULTURE  Grade I diastolic dysfunction- She has a normal volume status. -     torsemide (DEMADEX) 20 MG tablet; Take 1 tablet (20 mg total) by mouth daily.  Essential hypertension- Her blood pressure is adequately well controlled. -     torsemide (DEMADEX) 20 MG tablet; Take 1 tablet (20 mg total) by mouth daily.  I have discontinued Reyonna B. Dona's furosemide. I am also having her start on [place]. Additionally, I am having her maintain her sertraline, famotidine, ketoconazole, acetaminophen, metroNIDAZOLE, sodium chloride, zinc gluconate, trihexyphenidyl, zolpidem, hydrocortisone, Vitamin C CR, Flovent HFA, rizatriptan, Rexulti, Azelastine HCl, rosuvastatin, celecoxib, Cinnamon,  omeprazole, Carbinoxamine Maleate, montelukast, levothyroxine, fluticasone, albuterol, metoprolol tartrate, Myrbetriq, clonazePAM, and torsemide.  Meds ordered this encounter  Medications   Omadacycline Tosylate (NUZYRA) 150 MG TABS    Sig: Take 3 tablets by mouth daily for 1 day.    Dispense:  6 tablet    Refill:  0   Omadacycline Tosylate (NUZYRA) 150 MG TABS    Sig: Take 2 tablets by mouth daily for 7 days.    Dispense:  28 tablet    Refill:  0   torsemide (DEMADEX) 20 MG tablet    Sig: Take 1 tablet (20 mg total) by mouth daily.    Dispense:  90 tablet    Refill:  1     Follow-up: Return in about 1 week (around 09/26/2020).  [name], MD

## 2020-09-19 NOTE — Patient Instructions (Signed)

## 2020-09-22 ENCOUNTER — Telehealth: Payer: Self-pay | Admitting: Internal Medicine

## 2020-09-22 LAB — WOUND CULTURE

## 2020-09-22 NOTE — Telephone Encounter (Signed)
I have spoken to the pharmacist and informed him that pt should only have a 7-day Rx per PCP.

## 2020-09-22 NOTE — Telephone Encounter (Signed)
[id number] - [address], [place] Phone:  [phone]  Fax:  [phone]     Calling reference how prescription is written, states take for 7 days, but written for a 14 days supply  Omadacycline Tosylate (NUZYRA) 150 MG TABS  Please follow-up with the pharmacy

## 2020-09-23 ENCOUNTER — Telehealth: Payer: Self-pay | Admitting: Internal Medicine

## 2020-09-23 NOTE — Telephone Encounter (Signed)
Pt has been informed of results and expressed understanding.  °

## 2020-09-23 NOTE — Telephone Encounter (Signed)
Requesting a call back to go over her results from her wound culture.   Best callback [phone]

## 2020-09-30 ENCOUNTER — Other Ambulatory Visit: Payer: Self-pay

## 2020-09-30 ENCOUNTER — Encounter (HOSPITAL_BASED_OUTPATIENT_CLINIC_OR_DEPARTMENT_OTHER): Payer: Medicare HMO | Attending: Internal Medicine | Admitting: Internal Medicine

## 2020-09-30 DIAGNOSIS — L97228 Non-pressure chronic ulcer of left calf with other specified severity: Secondary | ICD-10-CM | POA: Diagnosis not present

## 2020-09-30 DIAGNOSIS — L97822 Non-pressure chronic ulcer of other part of left lower leg with fat layer exposed: Secondary | ICD-10-CM | POA: Diagnosis not present

## 2020-09-30 DIAGNOSIS — L0889 Other specified local infections of the skin and subcutaneous tissue: Secondary | ICD-10-CM | POA: Diagnosis not present

## 2020-09-30 DIAGNOSIS — I890 Lymphedema, not elsewhere classified: Secondary | ICD-10-CM | POA: Diagnosis not present

## 2020-09-30 DIAGNOSIS — E11622 Type 2 diabetes mellitus with other skin ulcer: Secondary | ICD-10-CM | POA: Diagnosis present

## 2020-09-30 NOTE — Progress Notes (Signed)
[name], QUICK ([id number]) Visit Report for 09/30/2020 Abuse/Suicide Risk Screen Details Patient Name: Date of Service: [name], [name] 09/30/2020 9:00 A M Medical Record Number: [id number] Patient Account Number: [id number] Date of Birth/Sex: Treating RN: [birth year] (65 y.o. Sue Lush Primary Care Verdell Kincannon: [name] Other Clinician: Referring Recie Cirrincione: Treating Craig Ionescu/Extender: [name] in Treatment: 0 Abuse/Suicide Risk Screen Items Answer ABUSE RISK SCREEN: Has anyone close to you tried to hurt or harm you recentlyo No Do you feel uncomfortable with anyone in your familyo No Has anyone forced you do things that you didnt want to doo No Electronic Signature(s) Signed: 09/30/2020 5:51:35 PM By: Lorrin Jackson Entered By: Lorrin Jackson on 09/30/2020 09:23:08 -------------------------------------------------------------------------------- Activities of Daily Living Details Patient Name: Date of Service: [name], JAEGERS 09/30/2020 9:00 A M Medical Record Number: [id number] Patient Account Number: [id number] Date of Birth/Sex: Treating RN: 11-11-1955 (65 y.o. Sue Lush Primary Care Chaquana Nichols: [name] Other Clinician: Referring Pat Sires: Treating Ellwyn Ergle/Extender: [name] in Treatment: 0 Activities of Daily Living Items Answer Activities of Daily Living (Please select one for each item) Drive Automobile Completely Able T Medications ake Completely Able Use T elephone Completely Able Care for Appearance Completely Able Use T oilet Completely Able Bath / Shower Completely Able Dress Self Completely Able Feed Self Completely Able Walk Completely Able Get In / Out Bed Completely Able Housework Completely Able Prepare Meals Completely Able Handle Money Completely Able Shop for Self Completely Able Electronic Signature(s) Signed: 09/30/2020 5:51:35 PM By: Lorrin Jackson Entered By:  Lorrin Jackson on 09/30/2020 09:23:30 -------------------------------------------------------------------------------- Education Screening Details Patient Name: Date of Service: [name] 09/30/2020 9:00 A M Medical Record Number: [id number] Patient Account Number: [id number] Date of Birth/Sex: Treating RN: 14-Apr-1955 (65 y.o. Sue Lush Primary Care Ezechiel Stooksbury: [name] Other Clinician: Referring Charbel Los: Treating Mcarthur Ivins/Extender: [name] in Treatment: 0 Primary Learner Assessed: Patient Learning Preferences/Education Level/Primary Language Learning Preference: Explanation, Demonstration, Printed Material Highest Education Level: College or Above Preferred Language: English Cognitive Barrier Language Barrier: No Translator Needed: No Memory Deficit: No Emotional Barrier: No Cultural/Religious Beliefs Affecting Medical Care: No Physical Barrier Impaired Vision: Yes Glasses Impaired Hearing: No Decreased Hand dexterity: No Knowledge/Comprehension Knowledge Level: High Comprehension Level: High Ability to understand written instructions: High Ability to understand verbal instructions: High Motivation Anxiety Level: Calm Cooperation: Cooperative Education Importance: Acknowledges Need Interest in Health Problems: Asks Questions Perception: Coherent Willingness to Engage in Self-Management High Activities: Readiness to Engage in Self-Management High Activities: Electronic Signature(s) Signed: 09/30/2020 5:51:35 PM By: Lorrin Jackson Entered By: Lorrin Jackson on 09/30/2020 09:24:02 -------------------------------------------------------------------------------- Fall Risk Assessment Details Patient Name: Date of Service: [name] 09/30/2020 9:00 A M Medical Record Number: [id number] Patient Account Number: [id number] Date of Birth/Sex: Treating RN: 04-24-1955 (65 y.o. Sue Lush Primary Care Symiah Nowotny:  [name] Other Clinician: Referring Lavonne Kinderman: Treating Manraj Yeo/Extender: [name] in Treatment: 0 Fall Risk Assessment Items Have you had 2 or more falls in the last 12 monthso 0 No Have you had any fall that resulted in injury in the last 12 monthso 0 No FALLS RISK SCREEN History of falling - immediate or within 3 months 0 No Secondary diagnosis (Do you have 2 or more medical diagnoseso) 15 Yes Ambulatory aid None/bed rest/wheelchair/nurse 0 Yes Crutches/cane/walker 0 No Furniture 0 No Intravenous therapy Access/Saline/Heparin Lock 0 No Gait/Transferring Normal/ bed rest/ wheelchair 0 Yes Weak (short steps with  or without shuffle, stooped but able to lift head while walking, may seek 0 No support from furniture) Impaired (short steps with shuffle, may have difficulty arising from chair, head down, impaired 0 No balance) Mental Status Oriented to own ability 0 Yes Electronic Signature(s) Signed: 09/30/2020 5:51:35 PM By: Lorrin Jackson Entered By: Lorrin Jackson on 09/30/2020 09:24:40 -------------------------------------------------------------------------------- Foot Assessment Details Patient Name: Date of Service: [name] 09/30/2020 9:00 A M Medical Record Number: [id number] Patient Account Number: [id number] Date of Birth/Sex: Treating RN: 17-Oct-1955 (65 y.o. Sue Lush Primary Care Montrice Gracey: [name] Other Clinician: Referring Trish Mancinelli: Treating Nikolos Billig/Extender: [name] in Treatment: 0 Foot Assessment Items Site Locations + = Sensation present, - = Sensation absent, C = Callus, U = Ulcer R = Redness, W = Warmth, M = Maceration, PU = Pre-ulcerative lesion F = Fissure, S = Swelling, D = Dryness Assessment Right: Left: Other Deformity: No No Prior Foot Ulcer: No No Prior Amputation: No No Charcot Joint: No No Ambulatory Status: Ambulatory Without Help Gait: Steady Electronic  Signature(s) Signed: 09/30/2020 5:51:35 PM By: Lorrin Jackson Entered By: Lorrin Jackson on 09/30/2020 09:33:10 -------------------------------------------------------------------------------- Nutrition Risk Screening Details Patient Name: Date of Service: KATIANNE, Whitney 09/30/2020 9:00 A M Medical Record Number: [id number] Patient Account Number: [id number] Date of Birth/Sex: Treating RN: 06/14/1955 (65 y.o. Sue Lush Primary Care Amyrie Illingworth: [name] Other Clinician: Referring Gemini Beaumier: Treating Shia Delaine/Extender: [name] in Treatment: 0 Height (in): 60 Weight (lbs): 174 Body Mass Index (BMI): 34 Nutrition Risk Screening Items Score Screening NUTRITION RISK SCREEN: I have an illness or condition that made me change the kind and/or amount of food I eat 0 No I eat fewer than two meals per day 0 No I eat few fruits and vegetables, or milk products 0 No I have three or more drinks of beer, liquor or wine almost every day 0 No I have tooth or mouth problems that make it hard for me to eat 0 No I don't always have enough money to buy the food I need 0 No I eat alone most of the time 0 No I take three or more different prescribed or over-the-counter drugs a day 1 Yes Without wanting to, I have lost or gained 10 pounds in the last six months 0 No I am not always physically able to shop, cook and/or feed myself 0 No Nutrition Protocols Good Risk Protocol 0 No interventions needed Moderate Risk Protocol High Risk Proctocol Risk Level: Good Risk Score: 1 Electronic Signature(s) Signed: 09/30/2020 5:51:35 PM By: Lorrin Jackson Entered By: Lorrin Jackson on 09/30/2020 09:25:01

## 2020-10-01 NOTE — Progress Notes (Signed)
[name], [name] ([id number]) Visit Report for 09/30/2020 Chief Complaint Document Details Patient Name: Date of Service: [name], [name] 09/30/2020 9:00 A M Medical Record Number: [id number] Patient Account Number: [id number] Date of Birth/Sex: Treating RN: [birth year] (65 y.o. [name], [name] Primary Care Provider: [name] Other Clinician: Referring Provider: Treating Provider/Extender: [name] in Treatment: 0 Information Obtained from: Patient Chief Complaint 09/30/2020; patient is here for a small remaining wound area on the left medial calf Electronic Signature(s) Signed: 10/01/2020 8:02:04 AM By: Linton Ham MD Entered By: Linton Ham on 09/30/2020 10:27:32 -------------------------------------------------------------------------------- HPI Details Patient Name: Date of Service: [name] 09/30/2020 9:00 A M Medical Record Number: [id number] Patient Account Number: [id number] Date of Birth/Sex: Treating RN: 10/07/1955 (65 y.o. [name], [name] Primary Care Provider: [name] Other Clinician: Referring Provider: Treating Provider/Extender: [name] in Treatment: 0 History of Present Illness HPI Description: ADMISSION 09/30/2020 This is a previously healthy 65 year old woman who is a type II diabetic but not on any current treatment. On 08/22/2020 she may have stepped on her pet cats tail and the cat violently attacked her biting her on her medial and lateral left lower leg. The cat is declined in the front. The patient was seen on 08/25/2020 by her primary physician diagnosed with cellulitis was given a shot of Rocephin and Augmentin for 7 days. The patient has had multiple visits since then including 09/03/2020 7/25 7/27 and most recently 8/12 mostly by her primary physician but also urgent care. She received a prolonged course of Augmentin, 7-day course of doxycycline and most recently an  8-day course of Nuzyra. In general looking at the pictures in epic and also the pictures on the patient's phone today things have gotten a lot better with intense angry erythema initially on the left medial lower leg with multiple small puncture wounds. Currently she has 1 small open area. There is an erythematous firm area medially. Everything is healed on the left although there is still some subcutaneous firmness to some of these areas. The patient stated that the maximum intensity of the infection she was running fevers in the mid 99 range but no other systemic symptoms. She still has 1 remaining small punched out area on the left medial calf. This still has some depth. There is firmness around this area which I think is probably subcutaneous tissue necrosis and mild erythema but no tenderness or crepitus. 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 She has been on oral diuretics for edema in her leg. She tells me she was reviewed by cardiology a year ago ABI on the left leg was 0.98 Electronic Signature(s) Signed: 10/01/2020 8:02:04 AM By: Linton Ham MD Entered By: Linton Ham on 09/30/2020 10:43:58 -------------------------------------------------------------------------------- Physical Exam Details Patient Name: Date of Service: [name] 09/30/2020 9:00 A M Medical Record Number: [id number] Patient Account Number: [id number] Date of Birth/Sex: Treating RN: 07-Sep-1955 (65 y.o. [name], [name] Primary Care Provider: [name] Other Clinician: Referring Provider: Treating Provider/Extender: [name] in Treatment: 0 Constitutional Sitting or standing Blood Pressure is within target range for patient.. Pulse regular and within target range for patient.Marland Kitchen Respirations  regular, non-labored and within target range.. Temperature is normal and within the target range for the patient.Marland Kitchen Appears in no distress. Cardiovascular Mild  to moderate nonpitting edema in the left leg. No visible overt signs of chronic venous insufficiency. Lymphatic She has no palpable lymph nodes in the popliteal or inguinal area. Notes Wound exam; areas on the left medial calf. Small punched-out open area with perhaps 2 mm in depth. The entire soft tissue around this is firm and somewhat erythematous especially anteriorly but no palpable tenderness no crepitus Electronic Signature(s) Signed: 10/01/2020 8:02:04 AM By: Linton Ham MD Entered By: Linton Ham on 09/30/2020 10:47:01 -------------------------------------------------------------------------------- Physician Orders Details Patient Name: Date of Service: [name] 09/30/2020 9:00 A M Medical Record Number: [id number] Patient Account Number: [id number] Date of Birth/Sex: Treating RN: 13-Oct-1955 (65 y.o. [name], [name] Primary Care Provider: [name] Other Clinician: Referring Provider: Treating Provider/Extender: [name] in Treatment: 0 Verbal / Phone Orders: No Diagnosis Coding Follow-up Appointments Return Appointment in 1 week. Bathing/ Shower/ Hygiene May shower with protection but do not get wound dressing(s) wet. - May use a cast wrap to protect the wrap. You can get these at [facility], [place], or [place]!!:) Edema Control - Lymphedema / SCD / Other Elevate legs to the level of the heart or above for 30 minutes daily and/or when sitting, a frequency of: Avoid standing for long periods of time. Patient to wear own compression stockings every day. Wound Treatment Wound #1 - Lower Leg Wound Laterality: Left, Medial Cleanser: Soap and Water 1 x Per Week/7 Days Discharge Instructions: May shower and wash wound with dial antibacterial soap and water prior to dressing  change. Cleanser: Wound Cleanser (Generic) 1 x Per Week/7 Days Discharge Instructions: Cleanse the wound with wound cleanser prior to applying a clean dressing using gauze sponges, not tissue or cotton balls. Peri-Wound Care: Triamcinolone 15 (g) 1 x Per Week/7 Days Discharge Instructions: Use triamcinolone 15 (g) as directed Peri-Wound Care: Sween Lotion (Moisturizing lotion) 1 x Per Week/7 Days Discharge Instructions: Apply moisturizing lotion as directed Prim Dressing: Promogran Prisma Matrix, 4.34 (sq in) (silver collagen) 1 x Per Week/7 Days ary Discharge Instructions: Moisten collagen with saline or hydrogel Secondary Dressing: Woven Gauze Sponge, Non-Sterile 4x4 in (Generic) 1 x Per Week/7 Days Discharge Instructions: Apply over primary dressing as directed. Secondary Dressing: ABD Pad, 5x9 (Generic) 1 x Per Week/7 Days Discharge Instructions: Apply over primary dressing as directed. Compression Wrap: ThreePress (3 layer compression wrap) 1 x Per Week/7 Days Discharge Instructions: Apply three layer compression as directed. Electronic Signature(s) Signed: 09/30/2020 5:31:05 PM By: Rhae Hammock RN Signed: 10/01/2020 8:02:04 AM By: Linton Ham MD Entered By: Rhae Hammock on 09/30/2020 10:03:37 -------------------------------------------------------------------------------- Problem List Details Patient Name: Date of Service: [name] 09/30/2020 9:00 A M Medical Record Number: [id number] Patient Account Number: [id number] Date of Birth/Sex: Treating RN: [birth year] (65 y.o. 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 09/30/2020 No Yes I89.0 Lymphedema, not elsewhere classified  09/30/2020 No Yes A28.1 Cat-scratch disease 09/30/2020 No Yes E11.622 Type 2 diabetes mellitus with other skin ulcer 09/30/2020 No Yes Inactive Problems Resolved Problems Electronic Signature(s) Signed: 10/01/2020 8:02:04 AM By: Linton Ham MD Entered By: Linton Ham on 09/30/2020 10:44:45 -------------------------------------------------------------------------------- Progress Note Details Patient Name: Date of Service: [name] 09/30/2020 9:00 A M Medical Record Number: [id number] Patient Account Number: [id number] Date of Birth/Sex: Treating  RN: 11/15/1955 (65 y.o. [name], [name] Primary Care Provider: [name] Other Clinician: Referring Provider: Treating Provider/Extender: [name] in Treatment: 0 Subjective Chief Complaint Information obtained from Patient 09/30/2020; patient is here for a small remaining wound area on the left medial calf History of Present Illness (HPI) ADMISSION 09/30/2020 This is a previously healthy 65 year old woman who is a type II diabetic but not on any current treatment. On 08/22/2020 she may have stepped on her pet cats tail and the cat violently attacked her biting her on her medial and lateral left lower leg. The cat is declined in the front. The patient was seen on 08/25/2020 by her primary physician diagnosed with cellulitis was given a shot of Rocephin and Augmentin for 7 days. The patient has had multiple visits since then including 09/03/2020 7/25 7/27 and most recently 8/12 mostly by her primary physician but also urgent care. She received a prolonged course of Augmentin, 7-day course of doxycycline and most recently an 8-day course of Nuzyra. In general looking at the pictures in epic and also the pictures on the patient's phone today things have gotten a lot better with intense angry erythema initially on the left medial lower leg with multiple small puncture wounds. Currently she has 1 small open  area. There is an erythematous firm area medially. Everything is healed on the left although there is still some subcutaneous firmness to some of these areas. The patient stated that the maximum intensity of the infection she was running fevers in the mid 99 range but no other systemic symptoms. She still has 1 remaining small punched out area on the left medial calf. This still has some depth. There is firmness around this area which I think is probably subcutaneous tissue necrosis and mild erythema but no tenderness or crepitus. 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 She has been on oral diuretics for edema in her leg. She tells me she was reviewed by cardiology a year ago ABI on the left leg was 0.98 Patient History Information obtained from Patient. Allergies Emetrol (Reaction: itching), indomethacin, Iodinated Contrast Media, amlodipine (Reaction: swelling), carbamazepine, pseudoephedrine, risperidone, bupivacaine (Reaction: hives), pentazocine, propoxyphene, Sulfa (Sulfonamide Antibiotics), tramadol, aspirin, codeine, etodolac, propranolol Family History Cancer - Father,Mother, Heart Disease - Mother,Paternal Grandparents,Maternal Grandparents, Hypertension - Mother, Lung Disease - Paternal Grandparents, Stroke - Maternal Grandparents, Thyroid Problems - Mother,Siblings, No family history of Diabetes, Hereditary Spherocytosis, Kidney Disease, Seizures, Tuberculosis. Social History Never smoker, Marital Status - Divorced, Alcohol Use - Rarely, Drug Use - No History, Caffeine Use - Moderate. Medical History Eyes Patient has history of Cataracts Ear/Nose/Mouth/Throat Patient has history of Middle ear problems - Tinnitis Respiratory Patient has history of Asthma, Sleep Apnea Cardiovascular Patient has history of  Hypertension Endocrine Patient has history of Type II Diabetes Musculoskeletal Patient has history of Osteoarthritis Psychiatric Patient has history of Confinement Anxiety Patient is treated with Controlled Diet. Medical A Surgical History Notes nd Cardiovascular Premature Ventricular Contraction Gastrointestinal GERD, IBS Endocrine Hypothyroidis Genitourinary Interstitial Cystitis Neurologic Neuroleptic Induced Parkinsonism Oncologic Skin Cancer-Ear Migraines Psychiatric Depression Review of Systems (ROS) Eyes Complains or has symptoms of Glasses / Contacts. Ear/Nose/Mouth/Throat Complains or has symptoms of Chronic sinus problems or rhinitis. Integumentary (Skin) Complains or has symptoms of Wounds. Psychiatric Complains or has symptoms of Claustrophobia. Objective Constitutional Sitting or standing Blood  Pressure is within target range for patient.. Pulse regular and within target range for patient.Marland Kitchen Respirations regular, non-labored and within target range.. Temperature is normal and within the target range for the patient.Marland Kitchen Appears in no distress. Vitals Time Taken: 9:13 AM, Height: 60 in, Source: Stated, Weight: 174 lbs, Source: Stated, BMI: 34, Temperature: 99.0 F, Pulse: 76 bpm, Respiratory Rate: 18 breaths/min, Blood Pressure: 102/62 mmHg. Cardiovascular Mild to moderate nonpitting edema in the left leg. No visible overt signs of chronic venous insufficiency. Lymphatic She has no palpable lymph nodes in the popliteal or inguinal area. General Notes: Wound exam; areas on the left medial calf. Small punched-out open area with perhaps 2 mm in depth. The entire soft tissue around this is firm and somewhat erythematous especially anteriorly but no palpable tenderness no crepitus Integumentary (Hair, Skin) Wound #1 status is Open. Original cause of wound was Trauma. The date acquired was: 08/22/2020. The wound is located on the Left,Medial Lower Leg. The wound  measures 0.5cm length x 0.6cm width x 0.4cm depth; 0.236cm^2 area and 0.094cm^3 volume. There is Fat Layer (Subcutaneous Tissue) exposed. There is no tunneling noted, however, there is undermining starting at 5:00 and ending at 9:00 with a maximum distance of 0.7cm. There is a medium amount of sanguinous drainage noted. The wound margin is well defined and not attached to the wound base. There is large (67-100%) red granulation within the wound bed. There is no necrotic tissue within the wound bed. General Notes: Periwound erythema Assessment Active Problems ICD-10 Non-pressure chronic ulcer of left calf with other specified severity Lymphedema, not elsewhere classified Cat-scratch disease Type 2 diabetes mellitus with other skin ulcer Procedures Wound #1 Pre-procedure diagnosis of Wound #1 is an Infection - not elsewhere classified located on the Left,Medial Lower Leg . There was a Three Layer Compression Therapy Procedure by Rhae Hammock, RN. Post procedure Diagnosis Wound #1: Same as Pre-Procedure Plan Follow-up Appointments: Return Appointment in 1 week. Bathing/ Shower/ Hygiene: May shower with protection but do not get wound dressing(s) wet. - May use a cast wrap to protect the wrap. You can get these at [facility], [place], or [place]!!:) Edema Control - Lymphedema / SCD / Other: Elevate legs to the level of the heart or above for 30 minutes daily and/or when sitting, a frequency of: Avoid standing for long periods of time. Patient to wear own compression stockings every day. WOUND #1: - Lower Leg Wound Laterality: Left, Medial Cleanser: Soap and Water 1 x Per Week/7 Days Discharge Instructions: May shower and wash wound with dial antibacterial soap and water prior to dressing change. Cleanser: Wound Cleanser (Generic) 1 x Per Week/7 Days Discharge Instructions: Cleanse the wound with wound cleanser prior to applying a clean dressing using gauze sponges, not tissue or cotton  balls. Peri-Wound Care: Triamcinolone 15 (g) 1 x Per Week/7 Days Discharge Instructions: Use triamcinolone 15 (g) as directed Peri-Wound Care: Sween Lotion (Moisturizing lotion) 1 x Per Week/7 Days Discharge Instructions: Apply moisturizing lotion as directed Prim Dressing: Promogran Prisma Matrix, 4.34 (sq in) (silver collagen) 1 x Per Week/7 Days ary Discharge Instructions: Moisten collagen with saline or hydrogel Secondary Dressing: Woven Gauze Sponge, Non-Sterile 4x4 in (Generic) 1 x Per Week/7 Days Discharge Instructions: Apply over primary dressing as directed. Secondary Dressing: ABD Pad, 5x9 (Generic) 1 x Per Week/7 Days Discharge Instructions: Apply over primary dressing as directed. Com pression Wrap: ThreePress (3 layer compression wrap) 1 x Per Week/7 Days Discharge Instructions: Apply three layer compression as directed. #  1 small remaining open wound secondary to an injury from cat bites. 2. It seems fairly clear from looking at available pictures on epic on the patient's cell phone that she had a very significant infection probably involving the subcutaneous tissues. This would probably account for why there is still firmness in the area and some degree of residual erythema. She has had multiple antibiotics including recently an 8-day course of Elesa Hacker that she just finished 2 days ago. At this point I did not feel it necessary to give her additional antibiotics 3. I suspect the patient probably has some degree of mild to moderate lymphedema which is nonpitting. This may be longstanding. This would complicate healing of any wounds and eradication of any infection 4. I used silver collagen on the wound that is still open, TCA to all the erythematous areas especially on the left medial lower leg and put her at 3 layer compression to see if we can control the swelling and hopefully that will allow this to close over. I have asked her to keep her legs elevated when she is sitting for  a prolonged period of time 5. There was never identifiable culture done at the 1 culture that was done was negative. I wondered about cat scratch disease although whether that could have been possible is probably not important at this point I spent 40 minutes in review of this patient's past medical history, face-to-face evaluation and preparation of this record Electronic Signature(s) Signed: 10/01/2020 8:02:04 AM By: Linton Ham MD Entered By: Linton Ham on 09/30/2020 10:50:29 -------------------------------------------------------------------------------- HxROS Details Patient Name: Date of Service: [name] 09/30/2020 9:00 A M Medical Record Number: [id number] Patient Account Number: [id number] Date of Birth/Sex: Treating RN: 1955/10/17 (65 y.o. [name] Primary Care Provider: [name] Other Clinician: Referring Provider: Treating Provider/Extender: [name] in Treatment: 0 Information Obtained From Patient Eyes Complaints and Symptoms: Positive for: Glasses / Contacts Medical History: Positive for: Cataracts Ear/Nose/Mouth/Throat Complaints and Symptoms: Positive for: Chronic sinus problems or rhinitis Medical History: Positive for: Middle ear problems - Tinnitis Integumentary (Skin) Complaints and Symptoms: Positive for: Wounds Psychiatric Complaints and Symptoms: Positive for: Claustrophobia Medical History: Positive for: Confinement Anxiety Past Medical History Notes: Depression Hematologic/Lymphatic Respiratory Medical History: Positive for: Asthma; Sleep Apnea Cardiovascular Medical History: Positive for: Hypertension Past Medical History Notes: Premature Ventricular Contraction Gastrointestinal Medical History: Past Medical History Notes: GERD, IBS Endocrine Medical History: Positive for: Type II Diabetes Past Medical History Notes: Hypothyroidis Treated with: Diet Genitourinary Medical  History: Past Medical History Notes: Interstitial Cystitis Immunological Musculoskeletal Medical History: Positive for: Osteoarthritis Neurologic Medical History: Past Medical History Notes: Neuroleptic Induced Parkinsonism Oncologic Medical History: Past Medical History Notes: Skin Cancer-Ear Migraines HBO Extended History Items Eyes: Ear/Nose/Mouth/Throat: Cataracts Middle ear problems Immunizations Pneumococcal Vaccine: Received Pneumococcal Vaccination: Yes Received Pneumococcal Vaccination On or After 60th Birthday: No Implantable Devices None Family and Social History Cancer: Yes - Father,Mother; Diabetes: No; Heart Disease: Yes - Mother,Paternal Grandparents,Maternal Grandparents; Hereditary Spherocytosis: No; Hypertension: Yes - Mother; Kidney Disease: No; Lung Disease: Yes - Paternal Grandparents; Seizures: No; Stroke: Yes - Maternal Grandparents; Thyroid Problems: Yes - Mother,Siblings; Tuberculosis: No; Never smoker; Marital Status - Divorced; Alcohol Use: Rarely; Drug Use: No History; Caffeine Use: Moderate; Financial Concerns: No; Food, Clothing or Shelter Needs: No; Support System Lacking: No; Transportation Concerns: No Electronic Signature(s) Signed: 09/30/2020 5:51:35 PM By: Lorrin Jackson Signed: 10/01/2020 8:02:04 AM By: Linton Ham MD Entered By: Lorrin Jackson on 09/30/2020  09:22:53 -------------------------------------------------------------------------------- SuperBill Details Patient Name: Date of Service: [name], [name] 09/30/2020 Medical Record Number: [id number] Patient Account Number: [id number] Date of Birth/Sex: Treating RN: Jun 20, 1955 (65 y.o. [name], [name] Primary Care Provider: [name] Other Clinician: Referring Provider: Treating Provider/Extender: [name] in Treatment: 0 Diagnosis Coding ICD-10 Codes Code Description [phone] Non-pressure chronic ulcer of left calf with other specified  severity I89.0 Lymphedema, not elsewhere classified A28.1 Cat-scratch disease E11.622 Type 2 diabetes mellitus with other skin ulcer Facility Procedures CPT4 Code: [id number] Description: [id number] - WOUND CARE VISIT-LEV 4 EST PT Modifier: Quantity: 1 CPT4 Code: [id number] Description: (Facility Use Only) [id number] - [place] [id number] LWR LT LEG Modifier: Quantity: 1 Physician Procedures : CPT4 Code Description Modifier [id number] [place] PHYS LEVEL 3 NEW PT ICD-10 Diagnosis Description L97.228 Non-pressure chronic ulcer of left calf with other specified severity I89.0 Lymphedema, not elsewhere classified A28.1 Cat-scratch disease E11.622 Type 2  diabetes mellitus with other skin ulcer Quantity: 1 Electronic Signature(s) Signed: 10/01/2020 8:02:04 AM By: Linton Ham MD Entered By: Linton Ham on 09/30/2020 10:51:08

## 2020-10-01 NOTE — Progress Notes (Signed)
[name], [name] ([id number]) Visit Report for 09/30/2020 Allergy List Details Patient Name: Date of Service: [name], [name] 09/30/2020 9:00 A M Medical Record Number: [id number] Patient Account Number: [id number] Date of Birth/Sex: Treating RN: [birth year] (65 y.o. [name] Primary Care Sheina Mcleish: [name] Other Clinician: Referring Dante Roudebush: Treating Raedyn Klinck/Extender: [name] in Treatment: 0 Allergies Active Allergies Emetrol Reaction: itching indomethacin Iodinated Contrast Media amlodipine Reaction: swelling carbamazepine pseudoephedrine risperidone bupivacaine Reaction: hives pentazocine propoxyphene Sulfa (Sulfonamide Antibiotics) tramadol aspirin codeine etodolac propranolol Allergy Notes Electronic Signature(s) Signed: 09/30/2020 5:51:35 PM By: Lorrin Jackson Entered By: Lorrin Jackson on 09/30/2020 09:06:45 -------------------------------------------------------------------------------- Arrival Information Details Patient Name: Date of Service: [name] 09/30/2020 9:00 A M Medical Record Number: [id number] Patient Account Number: [id number] Date of Birth/Sex: Treating RN: [birth year] (65 y.o. [name] Primary Care Newell Wafer: [name] Other Clinician: Referring Devery Odwyer: Treating Manfred Laspina/Extender: [name] in Treatment: 0 Visit Information Patient Arrived: Ambulatory Arrival Time: 09:02 Transfer Assistance: None Patient Identification Verified: Yes Secondary Verification Process Completed: Yes Patient Requires Transmission-Based Precautions: No Patient Has Alerts: Yes Patient Alerts: *No Lidocaine* Electronic Signature(s) Signed: 09/30/2020 5:51:35 PM By: Lorrin Jackson Entered By: Lorrin Jackson on 09/30/2020 09:45:14 -------------------------------------------------------------------------------- Clinic Level of Care Assessment Details Patient Name: Date of  Service: [name], [name] 09/30/2020 9:00 A M Medical Record Number: [id number] Patient Account Number: [id number] Date of Birth/Sex: Treating RN: August 14, 1955 (65 y.o. [name], [name] Primary Care Jerin Franzel: [name] Other Clinician: Referring Kurstyn Larios: Treating Yudith Norlander/Extender: [name] in Treatment: 0 Clinic Level of Care Assessment Items TOOL 3 Quantity Score X- 1 0 Use when EandM and Procedure is performed on FOLLOW-UP visit ASSESSMENTS - Nursing Assessment / Reassessment X- 1 10 Reassessment of Co-morbidities (includes updates in patient status) X- 1 5 Reassessment of Adherence to Treatment Plan ASSESSMENTS - Wound and Skin Assessment / Reassessment '[]'$  - Points for Wound Assessment can only be taken for a new wound of unknown or different etiology and a procedure is 0 NOT performed to that wound X- 1 5 Simple Wound Assessment / Reassessment - one wound '[]'$  - 0 Complex Wound Assessment / Reassessment - multiple wounds X- 1 10 Dermatologic / Skin Assessment (not related to wound area) ASSESSMENTS - Focused Assessment X- 1 5 Circumferential Edema Measurements - multi extremities '[]'$  - 0 Nutritional Assessment / Counseling / Intervention '[]'$  - 0 Lower Extremity Assessment (monofilament, tuning fork, pulses) '[]'$  - 0 Peripheral Arterial Disease Assessment (using hand held doppler) ASSESSMENTS - Ostomy and/or Continence Assessment and Care '[]'$  - 0 Incontinence Assessment and Management '[]'$  - 0 Ostomy Care Assessment and Management (repouching, etc.) PROCESS - Coordination of Care '[]'$  - Points for Discharge Coordination can only be taken for a new wound of unknown or different etiology and a procedure 0 is NOT performed to that wound X- 1 15 Simple Patient / Family Education for ongoing care '[]'$  - 0 Complex (extensive) Patient / Family Education for ongoing care X- 1 10 Staff obtains Programmer, systems, Records, T Results / Process Orders est '[]'$  -  0 Staff telephones HHA, Nursing Homes / Clarify orders / etc '[]'$  - 0 Routine Transfer to another Facility (non-emergent condition) '[]'$  - 0 Routine Hospital Admission (non-emergent condition) X- 1 15 New Admissions / Biomedical engineer / Ordering NPWT Apligraf, etc. , '[]'$  - 0 Emergency Hospital Admission (emergent condition) X- 1 10 Simple Discharge Coordination '[]'$  - 0 Complex (extensive) Discharge Coordination PROCESS - Special  Needs '[]'$  - 0 Pediatric / Minor Patient Management '[]'$  - 0 Isolation Patient Management '[]'$  - 0 Hearing / Language / Visual special needs '[]'$  - 0 Assessment of Community assistance (transportation, D/C planning, etc.) '[]'$  - 0 Additional assistance / Altered mentation '[]'$  - 0 Support Surface(s) Assessment (bed, cushion, seat, etc.) INTERVENTIONS - Wound Cleansing / Measurement '[]'$  - Points for Wound Cleaning / Measurement, Wound Dressing, Specimen Collection and Specimen taken to lab can only 0 be taken for a new wound of unknown or different etiology and a procedure is NOT performed to that wound X- 1 5 Simple Wound Cleansing - one wound '[]'$  - 0 Complex Wound Cleansing - multiple wounds X- 1 5 Wound Imaging (photographs - any number of wounds) '[]'$  - 0 Wound Tracing (instead of photographs) X- 1 5 Simple Wound Measurement - one wound '[]'$  - 0 Complex Wound Measurement - multiple wounds INTERVENTIONS - Wound Dressings '[]'$  - 0 Small Wound Dressing one or multiple wounds X- 1 15 Medium Wound Dressing one or multiple wounds '[]'$  - 0 Large Wound Dressing one or multiple wounds INTERVENTIONS - Miscellaneous '[]'$  - 0 External ear exam '[]'$  - 0 Specimen Collection (cultures, biopsies, blood, body fluids, etc.) '[]'$  - 0 Specimen(s) / Culture(s) sent or taken to Lab for analysis '[]'$  - 0 Patient Transfer (multiple staff / Civil Service fast streamer / Similar devices) '[]'$  - 0 Simple Staple / Suture removal (25 or less) '[]'$  - 0 Complex Staple / Suture removal (26 or more) '[]'$  -  0 Hypo / Hyperglycemic Management (close monitor of Blood Glucose) '[]'$  - 0 Ankle / Brachial Index (ABI) - do not check if billed separately X- 1 5 Vital Signs Has the patient been seen at the hospital within the last three years: Yes Total Score: 120 Level Of Care: New/Established - Level 4 Electronic Signature(s) Signed: 09/30/2020 5:31:05 PM By: Rhae Hammock RN Entered By: Rhae Hammock on 09/30/2020 09:55:20 -------------------------------------------------------------------------------- Compression Therapy Details Patient Name: Date of Service: [name] 09/30/2020 9:00 A M Medical Record Number: [id number] Patient Account Number: [id number] Date of Birth/Sex: Treating RN: 1956-01-10 (65 y.o. [name], [name] Primary Care Marvelous Woolford: [name] Other Clinician: Referring Jayah Balthazar: Treating Franchot Pollitt/Extender: [name] in Treatment: 0 Compression Therapy Performed for Wound Assessment: Wound #1 Left,Medial Lower Leg Performed By: Clinician Rhae Hammock, RN Compression Type: Three Layer Post Procedure Diagnosis Same as Pre-procedure Electronic Signature(s) Signed: 09/30/2020 5:31:05 PM By: Rhae Hammock RN Entered By: Rhae Hammock on 09/30/2020 09:55:46 -------------------------------------------------------------------------------- Encounter Discharge Information Details Patient Name: Date of Service: [name] 09/30/2020 9:00 A M Medical Record Number: [id number] Patient Account Number: [id number] Date of Birth/Sex: Treating RN: December 29, 1955 (65 y.o. [name] Primary Care Denyce Harr: [name] Other Clinician: Referring Ameliana Brashear: Treating Freedom Lopezperez/Extender: [name] in Treatment: 0 Encounter Discharge Information Items Discharge Condition: Stable Ambulatory Status: Ambulatory Discharge Destination: Home Transportation: Private Auto Accompanied By:  self Schedule Follow-up Appointment: Yes Clinical Summary of Care: Patient Declined Electronic Signature(s) Signed: 09/30/2020 6:01:23 PM By: Baruch Gouty RN, BSN Entered By: Baruch Gouty on 09/30/2020 10:24:35 -------------------------------------------------------------------------------- Lower Extremity Assessment Details Patient Name: Date of Service: [name] 09/30/2020 9:00 A M Medical Record Number: [id number] Patient Account Number: [id number] Date of Birth/Sex: Treating RN: [birth year] (65 y.o. [name] Primary Care Celicia Minahan: [name] Other Clinician: Referring Haniyah Maciolek: Treating Derell Bruun/Extender: [name] in Treatment: 0 Edema Assessment Assessed: Shirlyn Goltz: Yes] Patrice Paradise: No] Edema: [Left: N] [  Right: o] Calf Left: Right: Point of Measurement: 25 cm From Medial Instep 37.2 cm Ankle Left: Right: Point of Measurement: 7 cm From Medial Instep 23.6 cm Knee To Floor Left: Right: From Medial Instep 33 cm Vascular Assessment Pulses: Dorsalis Pedis Palpable: [Left:Yes] Doppler Audible: [Left:Yes] Blood Pressure: Brachial: [Left:102] Ankle: [Left:Dorsalis Pedis: 100 0.98] Electronic Signature(s) Signed: 09/30/2020 5:51:35 PM By: Lorrin Jackson Entered By: Lorrin Jackson on 09/30/2020 09:44:26 -------------------------------------------------------------------------------- Multi Wound Chart Details Patient Name: Date of Service: [name] 09/30/2020 9:00 A M Medical Record Number: [id number] Patient Account Number: [id number] Date of Birth/Sex: Treating RN: 18-Nov-1955 (65 y.o. [name], [name] Primary Care Maryclaire Stoecker: [name] Other Clinician: Referring Felicity Penix: Treating Kupono Marling/Extender: [name] in Treatment: 0 Vital Signs Height(in): 60 Pulse(bpm): 17 Weight(lbs): 174 Blood Pressure(mmHg): 102/62 Body Mass Index(BMI): 34 Temperature(F): 99.0 Respiratory  Rate(breaths/min): 18 Photos: [N/A:N/A] Left, Medial Lower Leg N/A N/A Wound Location: Trauma N/A N/A Wounding Event: Infection - not elsewhere classified N/A N/A Primary Etiology: Cataracts, Middle ear problems, N/A N/A Comorbid History: Asthma, Sleep Apnea, Hypertension, Type II Diabetes, Osteoarthritis, Confinement Anxiety 08/22/2020 N/A N/A Date Acquired: 0 N/A N/A Weeks of Treatment: Open N/A N/A Wound Status: 0.5x0.6x0.4 N/A N/A Measurements L x W x D (cm) 0.236 N/A N/A A (cm) : rea 0.094 N/A N/A Volume (cm) : 0.00% N/A N/A % Reduction in A rea: 0.00% N/A N/A % Reduction in Volume: 5 Starting Position 1 (o'clock): 9 Ending Position 1 (o'clock): 0.7 Maximum Distance 1 (cm): Yes N/A N/A Undermining: Full Thickness Without Exposed N/A N/A Classification: Support Structures Medium N/A N/A Exudate Amount: Sanguinous N/A N/A Exudate Type: red N/A N/A Exudate Color: Well defined, not attached N/A N/A Wound Margin: Large (67-100%) N/A N/A Granulation Amount: Red N/A N/A Granulation Quality: None Present (0%) N/A N/A Necrotic Amount: Fat Layer (Subcutaneous Tissue): Yes N/A N/A Exposed Structures: Fascia: No Tendon: No Muscle: No Joint: No Bone: No None N/A N/A Epithelialization: Periwound erythema N/A N/A Assessment Notes: Compression Therapy N/A N/A Procedures Performed: Treatment Notes Wound #1 (Lower Leg) Wound Laterality: Left, Medial Cleanser Soap and Water Discharge Instruction: May shower and wash wound with dial antibacterial soap and water prior to dressing change. Wound Cleanser Discharge Instruction: Cleanse the wound with wound cleanser prior to applying a clean dressing using gauze sponges, not tissue or cotton balls. Peri-Wound Care Triamcinolone 15 (g) Discharge Instruction: Use triamcinolone 15 (g) as directed Sween Lotion (Moisturizing lotion) Discharge Instruction: Apply moisturizing lotion as directed Topical Primary  Dressing Promogran Prisma Matrix, 4.34 (sq in) (silver collagen) Discharge Instruction: Moisten collagen with saline or hydrogel Secondary Dressing Woven Gauze Sponge, Non-Sterile 4x4 in Discharge Instruction: Apply over primary dressing as directed. ABD Pad, 5x9 Discharge Instruction: Apply over primary dressing as directed. Secured With Compression Wrap ThreePress (3 layer compression wrap) Discharge Instruction: Apply three layer compression as directed. Compression Stockings Add-Ons Electronic Signature(s) Signed: 09/30/2020 5:31:05 PM By: Rhae Hammock RN Signed: 10/01/2020 8:02:04 AM By: Linton Ham MD Entered By: Linton Ham on 09/30/2020 10:27:07 -------------------------------------------------------------------------------- Multi-Disciplinary Care Plan Details Patient Name: Date of Service: [name], [name] 09/30/2020 9:00 A M Medical Record Number: [id number] Patient Account Number: [id number] Date of Birth/Sex: Treating RN: 05/20/1955 (65 y.o. [name], [name] Primary Care Corwyn Vora: [name] Other Clinician: Referring Zephyra Bernardi: Treating Sachin Ferencz/Extender: [name] in Treatment: 0 Active Inactive Orientation to the Wound Care Program Nursing Diagnoses: Knowledge deficit related to the wound healing center program Goals: Patient/caregiver will verbalize  understanding of the [facility] Program Date Initiated: 09/30/2020 Target Resolution Date: 10/17/2020 Goal Status: Active Interventions: Provide education on orientation to the wound center Notes: Wound/Skin Impairment Nursing Diagnoses: Impaired tissue integrity Knowledge deficit related to ulceration/compromised skin integrity Goals: Patient will have a decrease in wound volume by X% from date: (specify in notes) Date Initiated: 09/30/2020 Target Resolution Date: 10/16/2020 Goal Status: Active Patient/caregiver will verbalize understanding of skin care  regimen Date Initiated: 09/30/2020 Target Resolution Date: 10/15/2020 Goal Status: Active Ulcer/skin breakdown will have a volume reduction of 30% by week 4 Date Initiated: 09/30/2020 Target Resolution Date: 10/16/2020 Goal Status: Active Interventions: Assess patient/caregiver ability to obtain necessary supplies Assess patient/caregiver ability to perform ulcer/skin care regimen upon admission and as needed Assess ulceration(s) every visit Notes: Electronic Signature(s) Signed: 09/30/2020 5:31:05 PM By: Rhae Hammock RN Entered By: Rhae Hammock on 09/30/2020 09:54:23 -------------------------------------------------------------------------------- Pain Assessment Details Patient Name: Date of Service: [name] 09/30/2020 9:00 A M Medical Record Number: [id number] Patient Account Number: [id number] Date of Birth/Sex: Treating RN: August 22, 1955 (65 y.o. [name] Primary Care Lunette Tapp: [name] Other Clinician: Referring Timeka Goette: Treating Topanga Alvelo/Extender: [name] in Treatment: 0 Active Problems Location of Pain Severity and Description of Pain Patient Has Paino Yes Site Locations Pain Location: Pain in Ulcers With Dressing Change: Yes Duration of the Pain. Constant / Intermittento Intermittent Rate the pain. Current Pain Level: 3 Worst Pain Level: 8 Character of Pain Describe the Pain: Aching, Tender Pain Management and Medication Current Pain Management: Medication: Yes Cold Application: No Rest: Yes Massage: No Activity: No T.E.N.S.: No Heat Application: No Leg drop or elevation: No Is the Current Pain Management Adequate: Inadequate How does your wound impact your activities of daily livingo Sleep: No Bathing: No Appetite: No Relationship With Others: No Bladder Continence: No Emotions: No Bowel Continence: No Work: No Toileting: No Drive: No Dressing: No Hobbies: No Electronic  Signature(s) Signed: 09/30/2020 5:51:35 PM By: Lorrin Jackson Entered By: Lorrin Jackson on 09/30/2020 09:32:43 -------------------------------------------------------------------------------- Patient/Caregiver Education Details Patient Name: Date of Service: [name], Diane N B. 8/23/2022andnbsp9:00 A M Medical Record Number: [id number] Patient Account Number: [id number] Date of Birth/Gender: Treating RN: 05-08-1955 (65 y.o. [name], [name] Primary Care Physician: [name] Other Clinician: Referring Physician: Treating Physician/Extender: [name] in Treatment: 0 Education Assessment Education Provided To: Patient Education Topics Provided Welcome T [facility]: o Methods: Explain/Verbal Responses: State content correctly Electronic Signature(s) Signed: 09/30/2020 5:31:05 PM By: Rhae Hammock RN Entered By: Rhae Hammock on 09/30/2020 09:54:31 -------------------------------------------------------------------------------- Wound Assessment Details Patient Name: Date of Service: [name] 09/30/2020 9:00 A M Medical Record Number: [id number] Patient Account Number: [id number] Date of Birth/Sex: Treating RN: [birth year] (65 y.o. [name] Primary Care Vinicio Lynk: [name] Other Clinician: Referring Patte Winkel: Treating Kayron Kalmar/Extender: [name] in Treatment: 0 Wound Status Wound Number: 1 Primary Infection - not elsewhere classified Etiology: Wound Location: Left, Medial Lower Leg Wound Open Wounding Event: Trauma Status: Date Acquired: 08/22/2020 Comorbid Cataracts, Middle ear problems, Asthma, Sleep Apnea, Weeks Of Treatment: 0 History: Hypertension, Type II Diabetes, Osteoarthritis, Confinement Clustered Wound: No Anxiety Photos Wound Measurements Length: (cm) 0.5 Width: (cm) 0.6 Depth: (cm) 0.4 Area: (cm) 0.236 Volume: (cm) 0.094 % Reduction in Area: 0% %  Reduction in Volume: 0% Epithelialization: None Tunneling: No Undermining: Yes Starting Position (o'clock): 5 Ending Position (o'clock): 9 Maximum Distance: (cm) 0.7 Wound Description Classification: Full Thickness Without Exposed  Support Structures Wound Margin: Well defined, not attached Exudate Amount: Medium Exudate Type: Sanguinous Exudate Color: red Foul Odor After Cleansing: No Slough/Fibrino No Wound Bed Granulation Amount: Large (67-100%) Exposed Structure Granulation Quality: Red Fascia Exposed: No Necrotic Amount: None Present (0%) Fat Layer (Subcutaneous Tissue) Exposed: Yes Tendon Exposed: No Muscle Exposed: No Joint Exposed: No Bone Exposed: No Assessment Notes Periwound erythema Treatment Notes Wound #1 (Lower Leg) Wound Laterality: Left, Medial Cleanser Soap and Water Discharge Instruction: May shower and wash wound with dial antibacterial soap and water prior to dressing change. Wound Cleanser Discharge Instruction: Cleanse the wound with wound cleanser prior to applying a clean dressing using gauze sponges, not tissue or cotton balls. Peri-Wound Care Triamcinolone 15 (g) Discharge Instruction: Use triamcinolone 15 (g) as directed Sween Lotion (Moisturizing lotion) Discharge Instruction: Apply moisturizing lotion as directed Topical Primary Dressing Promogran Prisma Matrix, 4.34 (sq in) (silver collagen) Discharge Instruction: Moisten collagen with saline or hydrogel Secondary Dressing Woven Gauze Sponge, Non-Sterile 4x4 in Discharge Instruction: Apply over primary dressing as directed. ABD Pad, 5x9 Discharge Instruction: Apply over primary dressing as directed. Secured With Compression Wrap ThreePress (3 layer compression wrap) Discharge Instruction: Apply three layer compression as directed. Compression Stockings Add-Ons Electronic Signature(s) Signed: 09/30/2020 5:51:35 PM By: Lorrin Jackson Entered By: Lorrin Jackson on 09/30/2020  09:38:39 -------------------------------------------------------------------------------- [facility] Details Patient Name: Date of Service: [name] 09/30/2020 9:00 A M Medical Record Number: [id number] Patient Account Number: [id number] Date of Birth/Sex: Treating RN: 1955/05/03 (65 y.o. [name] Primary Care Latorria Zeoli: [name] Other Clinician: Referring Nasra Counce: Treating Hilda Wexler/Extender: [name] in Treatment: 0 Vital Signs Time Taken: 09:13 Temperature (F): 99.0 Height (in): 60 Pulse (bpm): 76 Source: Stated Respiratory Rate (breaths/min): 18 Weight (lbs): 174 Blood Pressure (mmHg): 102/62 Source: Stated Reference Range: 80 - 120 mg / dl Body Mass Index (BMI): 34 Electronic Signature(s) Signed: 09/30/2020 5:51:35 PM By: Lorrin Jackson Entered By: Lorrin Jackson on 09/30/2020 09:14:34

## 2020-10-07 ENCOUNTER — Encounter (HOSPITAL_BASED_OUTPATIENT_CLINIC_OR_DEPARTMENT_OTHER): Payer: Medicare HMO | Admitting: Internal Medicine

## 2020-10-07 ENCOUNTER — Other Ambulatory Visit: Payer: Self-pay

## 2020-10-07 DIAGNOSIS — G4733 Obstructive sleep apnea (adult) (pediatric): Secondary | ICD-10-CM | POA: Diagnosis not present

## 2020-10-07 DIAGNOSIS — L97822 Non-pressure chronic ulcer of other part of left lower leg with fat layer exposed: Secondary | ICD-10-CM | POA: Diagnosis not present

## 2020-10-07 DIAGNOSIS — I890 Lymphedema, not elsewhere classified: Secondary | ICD-10-CM | POA: Diagnosis not present

## 2020-10-07 DIAGNOSIS — L97228 Non-pressure chronic ulcer of left calf with other specified severity: Secondary | ICD-10-CM | POA: Diagnosis not present

## 2020-10-07 DIAGNOSIS — E11622 Type 2 diabetes mellitus with other skin ulcer: Secondary | ICD-10-CM | POA: Diagnosis not present

## 2020-10-08 NOTE — Progress Notes (Signed)
[name], [name] ([id number]) Visit Report for 10/07/2020 HPI Details Patient Name: Date of Service: [name], [name] 10/07/2020 1:15 PM Medical Record Number: [id number] Patient Account Number: [id number] Date of Birth/Sex: Treating RN: 04/20/1955 (65 y.o. [name], [name] Primary Care Provider: Scarlette Whitney Other Clinician: Referring Provider: Treating Provider/Extender: [name] in Treatment: 1 History of Present Illness HPI Description: ADMISSION 09/30/2020 This is a previously healthy 65 year old woman who is a type II diabetic but not on any current treatment. On 08/22/2020 she may have stepped on her pet cats tail and the cat violently attacked her biting her on her medial and lateral left lower leg. The cat is declined in the front. The patient was seen on 08/25/2020 by her primary physician diagnosed with cellulitis was given a shot of Rocephin and Augmentin for 7 days. The patient has had multiple visits since then including 09/03/2020 7/25 7/27 and most recently 8/12 mostly by her primary physician but also urgent care. She received a prolonged course of Augmentin, 7-day course of doxycycline and most recently an 8-day course of Nuzyra. In general looking at the pictures in epic and also the pictures on the patient's phone today things have gotten a lot better with intense angry erythema initially on the left medial lower leg with multiple small puncture wounds. Currently she has 1 small open area. There is an erythematous firm area medially. Everything is healed on the left although there is still some subcutaneous firmness to some of these areas. The patient stated that the maximum intensity of the infection she was running fevers in the mid 99 range but no other systemic symptoms. She still has 1 remaining small punched out area on the left medial calf. This still has some depth. There is firmness around this area which I think is probably subcutaneous  tissue necrosis and mild erythema but no tenderness or crepitus. 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 She has been on oral diuretics for edema in her leg. She tells me she was reviewed by cardiology a year ago ABI on the left leg was 0.98 10/07/2020; patient is back into clinic this week. Much better edema control in her left leg. The firm areas which I think were secondary to subcutaneous tissue necrosis all feel a lot better however she still has the small but deeply punched out wound on the left medial lower leg Electronic Signature(s) Signed: 10/08/2020 7:53:49 AM By: Diane Ham MD Entered By: [name] on 10/07/2020 14:48:05 -------------------------------------------------------------------------------- Physical Exam Details Patient Name: Date of Service: [name] 10/07/2020 1:15 PM Medical Record Number: [id number] Patient Account Number: [id number] Date of Birth/Sex: Treating RN: [birth year] (65 y.o. [name], [name] Primary Care Provider: Scarlette Whitney Other Clinician: Referring Provider: Treating Provider/Extender: [name] in Treatment: 1 Constitutional Sitting or standing Blood Pressure is within target range for patient.. Pulse regular and within target range for patient.Marland Kitchen Respirations regular, non-labored and within target range.. Temperature is normal and within the target range for the patient.Marland Kitchen Appears in no distress. Notes Wound exam; left medial lower leg still small punched-out area. The skin tissue around here which was hard last week seems less hard and simply not tender enough to suggest ongoing infection. There is nothing on the left lateral lower leg Electronic Signature(s) Signed: 10/08/2020 7:53:49 AM By:  Diane Ham MD Entered By:  [name] on 10/07/2020 14:48:57 -------------------------------------------------------------------------------- Physician Orders Details Patient Name: Date of Service: [name], [name] 10/07/2020 1:15 PM Medical Record Number: [id number] Patient Account Number: [id number] Date of Birth/Sex: Treating RN: 1955/03/09 (65 y.o. 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 Bathing/ Shower/ Hygiene May shower with protection but do not get wound dressing(s) wet. - May use a cast wrap to protect the wrap. You can get these at [facility], [place], or [place]!!:) Edema Control - Lymphedema / SCD / Other Elevate legs to the level of the heart or above for 30 minutes daily and/or when sitting, a frequency of: Avoid standing for long periods of time. Patient to wear own compression stockings every day. Wound Treatment Wound #1 - Lower Leg Wound Laterality: Left, Medial Cleanser: Soap and Water 1 x Per Week/7 Days Discharge Instructions: May shower and wash wound with dial antibacterial soap and water prior to dressing change. Cleanser: Wound Cleanser (Generic) 1 x Per Week/7 Days Discharge Instructions: Cleanse the wound with wound cleanser prior to applying a clean dressing using gauze sponges, not tissue or cotton balls. Peri-Wound Care: Triamcinolone 15 (g) 1 x Per Week/7 Days Discharge Instructions: Use triamcinolone 15 (g) as directed Peri-Wound Care: Sween Lotion (Moisturizing lotion) 1 x Per Week/7 Days Discharge Instructions:  Apply moisturizing lotion as directed Prim Dressing: Endoform 2x2 in 1 x Per Week/7 Days ary Discharge Instructions: Moisten with saline Secondary Dressing: Woven Gauze Sponge, Non-Sterile 4x4 in (Generic) 1 x Per Week/7 Days Discharge Instructions: Apply over primary dressing as directed. Secondary Dressing: ABD Pad, 5x9 (Generic) 1 x Per Week/7 Days Discharge Instructions: Apply over primary dressing as directed. Compression Wrap: ThreePress (3 layer compression wrap) 1 x Per Week/7 Days Discharge Instructions: Apply three layer compression as directed. Electronic Signature(s) Signed: 10/07/2020 5:24:19 PM By: Rhae Hammock RN Signed: 10/08/2020 7:53:49 AM By: Diane Ham MD Entered By: Rhae Hammock on 10/07/2020 14:49:57 -------------------------------------------------------------------------------- Problem List Details Patient Name: Date of Service: [name], [name] 10/07/2020 1:15 PM Medical Record Number: [id number] Patient Account Number: [id number] Date of Birth/Sex: Treating RN: Feb 05, 1956 (65 y.o. 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 [name], [name] Primary Care Provider: Scarlette Whitney Other Clinician: Referring Provider: Treating Provider/Extender: [name] in Treatment: 1 Subjective History of Present Illness (HPI) ADMISSION 09/30/2020 This is a previously healthy 65 year old woman who is a type II diabetic but not on any current  treatment. On 08/22/2020 she may have stepped on her pet cats tail and the cat violently attacked her biting her on her medial and lateral left lower leg. The cat is declined in the front. The patient was seen on 08/25/2020 by her primary physician diagnosed with cellulitis was given a shot of Rocephin and Augmentin for 7 days. The patient has had multiple visits since then including 09/03/2020 7/25 7/27 and most recently 8/12 mostly by her primary physician but also urgent care. She received a prolonged course of Augmentin, 7-day course of doxycycline and most recently an 8-day course of Nuzyra. In general looking at the pictures in epic and also the pictures on the patient's phone today things have gotten a lot better with intense angry erythema initially on the left medial lower leg with multiple small puncture wounds. Currently she has 1 small open area. There is an erythematous firm area medially. Everything is healed on the left although there is still some subcutaneous firmness to some of these areas. The patient stated that the maximum intensity of the infection she was running fevers in the mid 99 range but no other systemic symptoms. She still has 1 remaining small punched out area on the left medial calf. This still has some depth. There is firmness around this area which I think is probably subcutaneous tissue necrosis and mild erythema but no tenderness or crepitus. 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 She has been on oral diuretics for edema in her leg. She tells me she was reviewed by cardiology a year ago ABI on the left leg was 0.98 10/07/2020; patient is back into clinic this week. Much better edema control in her left leg. The firm areas which I think were secondary to subcutaneous tissue necrosis all feel a lot better however she still has the small but deeply punched out wound on the left medial lower leg Objective Constitutional Sitting or standing Blood Pressure is within target range for patient.. Pulse regular and within target range for patient.Marland Kitchen Respirations regular, non-labored and within target range.. Temperature is normal and within the target range for the patient.Marland Kitchen Appears in no distress. Vitals Time Taken: 2:12 PM, Height: 60 in, Weight: 174 lbs, BMI: 34, Temperature: 98.5 F, Pulse: 73 bpm, Respiratory Rate: 17 breaths/min, Blood Pressure: 124/86 mmHg. General Notes: Wound exam; left medial lower leg still small punched-out area. The skin tissue around here which was hard last week seems less hard and simply not tender enough to suggest ongoing infection. There is nothing on the left lateral lower leg Integumentary (Hair, Skin) Wound #1 status is Open. Original cause of wound was Trauma. The date acquired was: 08/22/2020. The wound has been in treatment 1 weeks. The wound is located on the Left,Medial Lower Leg. The wound measures 0.4cm length x 0.4cm width x 0.6cm depth; 0.126cm^2 area and 0.075cm^3 volume. There is Fat Layer (Subcutaneous Tissue) exposed. There is no tunneling noted, however, there is undermining starting at 12:00 and ending at 12:00 with a maximum distance of 0.6cm. There is a medium amount of sanguinous drainage noted. The wound margin is well defined and not attached to the wound base. There is large (67-100%) red  granulation within the wound bed. There is a small (1-33%) amount of necrotic tissue within the wound bed including Adherent Slough. Assessment Active Problems ICD-10 Non-pressure  chronic ulcer of left calf with other specified severity Lymphedema, not elsewhere classified Cat-scratch disease Type 2 diabetes mellitus with other skin ulcer Plan 1. I change the primary dressing to endoform from Prisma 2. Still under 3 layer compression 3. Case manager suggested an advanced treatment product and well I think it somewhat early for this in the process it certainly is possible little will come to this perhaps single-layer Oasis for a small tunneling wound. Electronic Signature(s) Signed: 10/08/2020 7:53:49 AM By: Diane Ham MD Entered By: [name] on 10/07/2020 14:50:05 -------------------------------------------------------------------------------- SuperBill Details Patient Name: Date of Service: [name], [name] 10/07/2020 Medical Record Number: [id number] Patient Account Number: [id number] Date of Birth/Sex: Treating RN: 06-Jul-1955 (65 y.o. 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 E11.622 Type 2 diabetes mellitus with other skin ulcer Facility Procedures CPT4 Code: [id number] Description: (Facility Use Only) [id number] - [facility] COMPRS LWR LT LEG Modifier: Quantity: 1 Physician Procedures : CPT4 Code Description Modifier [id number] - WC PHYS LEVEL 3 - EST PT ICD-10 Diagnosis Description L97.228 Non-pressure chronic ulcer of left calf with other specified severity I89.0 Lymphedema, not elsewhere classified Quantity: 1 Electronic  Signature(s) Signed: 10/07/2020 5:24:19 PM By: Rhae Hammock RN Signed: 10/08/2020 7:53:49 AM By: Diane Ham MD Entered By: Rhae Hammock on 10/07/2020 14:52:38

## 2020-10-08 NOTE — Progress Notes (Signed)
[name], [name] ([id number]) Visit Report for 10/07/2020 Arrival Information Details Patient Name: Date of Service: [name], [name] 10/07/2020 1:15 PM Medical Record Number: [id number] Patient Account Number: [id number] Date of Birth/Sex: Treating RN: 1955-05-04 (65 y.o. [name], [name] Primary Care Keywon Mestre: [name] Other Clinician: Referring Trumaine Wimer: Treating Sacred Roa/Extender: [name] in Treatment: 1 Visit Information History Since Last Visit Added or deleted any medications: No Patient Arrived: Ambulatory Any new allergies or adverse reactions: No Arrival Time: 14:10 Had a fall or experienced change in No Accompanied By: self activities of daily living that may affect Transfer Assistance: None risk of falls: Patient Identification Verified: Yes Signs or symptoms of abuse/neglect since last visito No Secondary Verification Process Completed: Yes Hospitalized since last visit: No Patient Requires Transmission-Based Precautions: No Implantable device outside of the clinic excluding No Patient Has Alerts: Yes cellular tissue based products placed in the center Patient Alerts: *No Lidocaine* since last visit: Has Dressing in Place as Prescribed: Yes Has Compression in Place as Prescribed: Yes Pain Present Now: Yes Electronic Signature(s) Signed: 10/07/2020 5:24:19 PM By: Rhae Hammock RN Entered By: Rhae Hammock on 10/07/2020 14:12:28 -------------------------------------------------------------------------------- Compression Therapy Details Patient Name: Date of Service: [name] 10/07/2020 1:15 PM Medical Record Number: [id number] Patient Account Number: [id number] Date of Birth/Sex: Treating RN: December 14, 1955 (64 y.o. [name], [name] Primary Care Miley Lindon: [name] Other Clinician: Referring Lauris Serviss: Treating Raelie Lohr/Extender: [name] in Treatment: 1 Compression Therapy  Performed for Wound Assessment: Wound #1 Left,Medial Lower Leg Performed By: Clinician Rhae Hammock, RN Compression Type: Three Layer Post Procedure Diagnosis Same as Pre-procedure Electronic Signature(s) Signed: 10/07/2020 5:24:19 PM By: Rhae Hammock RN Entered By: Rhae Hammock on 10/07/2020 14:51:17 -------------------------------------------------------------------------------- Encounter Discharge Information Details Patient Name: Date of Service: [name], [name] 10/07/2020 1:15 PM Medical Record Number: [id number] Patient Account Number: [id number] Date of Birth/Sex: Treating RN: 1955/09/02 (65 y.o. [name], [name] Primary Care Taliana Mersereau: [name] Other Clinician: Referring Junie Engram: Treating Karmel Patricelli/Extender: [name] in Treatment: 1 Encounter Discharge Information Items Discharge Condition: Stable Ambulatory Status: Ambulatory Discharge Destination: Home Transportation: Private Auto Accompanied By: self Schedule Follow-up Appointment: Yes Clinical Summary of Care: Electronic Signature(s) Signed: 10/07/2020 5:24:19 PM By: Rhae Hammock RN Entered By: Rhae Hammock on 10/07/2020 17:21:29 -------------------------------------------------------------------------------- Lower Extremity Assessment Details Patient Name: Date of Service: [name], [name] 10/07/2020 1:15 PM Medical Record Number: [id number] Patient Account Number: [id number] Date of Birth/Sex: Treating RN: 1955-05-27 (65 y.o. [name], [name] Primary Care Mead Slane: [name] Other Clinician: Referring Saki Legore: Treating Rashon Rezek/Extender: [name] in Treatment: 1 Edema Assessment Assessed: Shirlyn Goltz: Yes] Patrice Paradise: No] Edema: [Left: Ye] [Right: s] Calf Left: Right: Point of Measurement: 25 cm From Medial Instep 38.5 cm Ankle Left: Right: Point of Measurement: 7 cm From Medial Instep 23.5 cm Electronic  Signature(s) Signed: 10/07/2020 5:24:19 PM By: Rhae Hammock RN Entered By: Rhae Hammock on 10/07/2020 14:18:58 -------------------------------------------------------------------------------- Multi Wound Chart Details Patient Name: Date of Service: [name] 10/07/2020 1:15 PM Medical Record Number: [id number] Patient Account Number: [id number] Date of Birth/Sex: Treating RN: [birth year] (65 y.o. [name], [name] Primary Care Cookie Pore: [name] Other Clinician: Referring Bobi Daudelin: Treating Shelsie Tijerino/Extender: [name] in Treatment: 1 Vital Signs Height(in): 60 Pulse(bpm): 13 Weight(lbs): 174 Blood Pressure(mmHg): 124/86 Body Mass Index(BMI): 34 Temperature(F): 98.5 Respiratory Rate(breaths/min): 17 Photos: [1:No Photos Left, Medial Lower Leg] [N/A:N/A N/A] Wound Location: [1:Trauma] [N/A:N/A] Wounding Event: [1:Infection -  not elsewhere classified N/A] Primary Etiology: [1:Cataracts, Middle ear problems,] [N/A:N/A] Comorbid History: [1:Asthma, Sleep Apnea, Hypertension, Type II Diabetes, Osteoarthritis, Confinement Anxiety 08/22/2020] [N/A:N/A] Date Acquired: [1:1] [N/A:N/A] Weeks of Treatment: [1:Open] [N/A:N/A] Wound Status: [1:0.4x0.4x0.6] [N/A:N/A] Measurements L x W x D (cm) [1:0.126] [N/A:N/A] A (cm) : rea [1:0.075] [N/A:N/A] Volume (cm) : [1:46.60%] [N/A:N/A] % Reduction in A rea: [1:20.20%] [N/A:N/A] % Reduction in Volume: [1:12] Starting Position 1 (o'clock): [1:12] Ending Position 1 (o'clock): [1:0.6] Maximum Distance 1 (cm): [1:Yes] [N/A:N/A] Undermining: [1:Full Thickness Without Exposed] [N/A:N/A] Classification: [1:Support Structures Medium] [N/A:N/A] Exudate Amount: [1:Sanguinous] [N/A:N/A] Exudate Type: [1:red] [N/A:N/A] Exudate Color: [1:Well defined, not attached] [N/A:N/A] Wound Margin: [1:Large (67-100%)] [N/A:N/A] Granulation Amount: [1:Red] [N/A:N/A] Granulation Quality: [1:Small (1-33%)]  [N/A:N/A] Necrotic Amount: [1:Fat Layer (Subcutaneous Tissue): Yes N/A] Exposed Structures: [1:Fascia: No Tendon: No Muscle: No Joint: No Bone: No None] [N/A:N/A] Treatment Notes Electronic Signature(s) Signed: 10/07/2020 5:24:19 PM By: Rhae Hammock RN Signed: 10/08/2020 7:53:49 AM By: Linton Ham MD Entered By: Linton Ham on 10/07/2020 14:45:52 -------------------------------------------------------------------------------- Multi-Disciplinary Care Plan Details Patient Name: Date of Service: [name], [name] 10/07/2020 1:15 PM Medical Record Number: [id number] Patient Account Number: [id number] Date of Birth/Sex: Treating RN: 1955-04-13 (65 y.o. [name], [name] Primary Care Charmine Bockrath: [name] Other Clinician: Referring Billal Rollo: Treating Rosie Torrez/Extender: [name] in Treatment: 1 Active Inactive Orientation to the Wound Care Program Nursing Diagnoses: Knowledge deficit related to the wound healing center program Goals: Patient/caregiver will verbalize understanding of the [place] Date Initiated: 09/30/2020 Target Resolution Date: 10/17/2020 Goal Status: Active Interventions: Provide education on orientation to the wound center Notes: Wound/Skin Impairment Nursing Diagnoses: Impaired tissue integrity Knowledge deficit related to ulceration/compromised skin integrity Goals: Patient will have a decrease in wound volume by X% from date: (specify in notes) Date Initiated: 09/30/2020 Target Resolution Date: 10/16/2020 Goal Status: Active Patient/caregiver will verbalize understanding of skin care regimen Date Initiated: 09/30/2020 Target Resolution Date: 10/15/2020 Goal Status: Active Ulcer/skin breakdown will have a volume reduction of 30% by week 4 Date Initiated: 09/30/2020 Target Resolution Date: 10/16/2020 Goal Status: Active Interventions: Assess patient/caregiver ability to obtain necessary  supplies Assess patient/caregiver ability to perform ulcer/skin care regimen upon admission and as needed Assess ulceration(s) every visit Notes: Electronic Signature(s) Signed: 10/07/2020 5:24:19 PM By: Rhae Hammock RN Entered By: Rhae Hammock on 10/07/2020 14:50:14 -------------------------------------------------------------------------------- Pain Assessment Details Patient Name: Date of Service: [name] 10/07/2020 1:15 PM Medical Record Number: [id number] Patient Account Number: [id number] Date of Birth/Sex: Treating RN: Sep 05, 1955 (65 y.o. [name], [name] Primary Care Jakiyah Stepney: [name] Other Clinician: Referring Pattiann Solanki: Treating Brittin Janik/Extender: [name] in Treatment: 1 Active Problems Location of Pain Severity and Description of Pain Patient Has Paino Yes Site Locations Pain Location: Pain Location: Pain in Ulcers With Dressing Change: Yes Duration of the Pain. Constant / Intermittento Intermittent Rate the pain. Current Pain Level: 3 Worst Pain Level: 10 Least Pain Level: 0 Tolerable Pain Level: 3 Character of Pain Describe the Pain: Aching Pain Management and Medication Current Pain Management: Medication: Yes Cold Application: No Rest: Yes Massage: No Activity: No T.E.N.S.: No Heat Application: No Leg drop or elevation: No Is the Current Pain Management Adequate: Adequate How does your wound impact your activities of daily livingo Sleep: No Bathing: No Appetite: No Relationship With Others: No Bladder Continence: No Emotions: No Bowel Continence: No Work: No Toileting: No Drive: No Dressing: No Hobbies: No Electronic Signature(s) Signed: 10/07/2020 5:24:19 PM By:  Rhae Hammock RN Entered By: Rhae Hammock on 10/07/2020 14:13:30 -------------------------------------------------------------------------------- Patient/Caregiver Education Details Patient Name: Date of  Service: [name], [name] 8/30/2022andnbsp1:15 PM Medical Record Number: [id number] Patient Account Number: [id number] Date of Birth/Gender: Treating RN: 03-11-1955 (65 y.o. [name], [name] Primary Care Physician: [name] Other Clinician: Referring Physician: Treating Physician/Extender: [name] in Treatment: 1 Education Assessment Education Provided To: Patient Education Topics Provided Welcome T [facility]: o Methods: Explain/Verbal Responses: State content correctly Electronic Signature(s) Signed: 10/07/2020 5:24:19 PM By: Rhae Hammock RN Entered By: Rhae Hammock on 10/07/2020 14:51:54 -------------------------------------------------------------------------------- Wound Assessment Details Patient Name: Date of Service: [name], [name] 10/07/2020 1:15 PM Medical Record Number: [id number] Patient Account Number: [id number] Date of Birth/Sex: Treating RN: 1955/08/24 (65 y.o. [name], [name] Primary Care Inge Waldroup: [name] Other Clinician: Referring Shirley Decamp: Treating Sam Wunschel/Extender: [name] in Treatment: 1 Wound Status Wound Number: 1 Primary Infection - not elsewhere classified Etiology: Wound Location: Left, Medial Lower Leg Wound Open Wounding Event: Trauma Status: Date Acquired: 08/22/2020 Comorbid Cataracts, Middle ear problems, Asthma, Sleep Apnea, Weeks Of Treatment: 1 History: Hypertension, Type II Diabetes, Osteoarthritis, Confinement Clustered Wound: No Anxiety Wound Measurements Length: (cm) 0.4 Width: (cm) 0.4 Depth: (cm) 0.6 Area: (cm) 0.126 Volume: (cm) 0.075 % Reduction in Area: 46.6% % Reduction in Volume: 20.2% Epithelialization: None Tunneling: No Undermining: Yes Starting Position (o'clock): 12 Ending Position (o'clock): 12 Maximum Distance: (cm) 0.6 Wound Description Classification: Full Thickness Without Exposed Support  Structures Wound Margin: Well defined, not attached Exudate Amount: Medium Exudate Type: Sanguinous Exudate Color: red Foul Odor After Cleansing: No Slough/Fibrino Yes Wound Bed Granulation Amount: Large (67-100%) Exposed Structure Granulation Quality: Red Fascia Exposed: No Necrotic Amount: Small (1-33%) Fat Layer (Subcutaneous Tissue) Exposed: Yes Necrotic Quality: Adherent Slough Tendon Exposed: No Muscle Exposed: No Joint Exposed: No Bone Exposed: No Treatment Notes Wound #1 (Lower Leg) Wound Laterality: Left, Medial Cleanser Soap and Water Discharge Instruction: May shower and wash wound with dial antibacterial soap and water prior to dressing change. Wound Cleanser Discharge Instruction: Cleanse the wound with wound cleanser prior to applying a clean dressing using gauze sponges, not tissue or cotton balls. Peri-Wound Care Triamcinolone 15 (g) Discharge Instruction: Use triamcinolone 15 (g) as directed Sween Lotion (Moisturizing lotion) Discharge Instruction: Apply moisturizing lotion as directed Topical Primary Dressing Endoform 2x2 in Discharge Instruction: Moisten with saline Secondary Dressing Woven Gauze Sponge, Non-Sterile 4x4 in Discharge Instruction: Apply over primary dressing as directed. ABD Pad, 5x9 Discharge Instruction: Apply over primary dressing as directed. Secured With Compression Wrap ThreePress (3 layer compression wrap) Discharge Instruction: Apply three layer compression as directed. Compression Stockings Add-Ons Electronic Signature(s) Signed: 10/07/2020 5:24:19 PM By: Rhae Hammock RN Entered By: Rhae Hammock on 10/07/2020 14:31:13 -------------------------------------------------------------------------------- Vitals Details Patient Name: Date of Service: [name], [name] 10/07/2020 1:15 PM Medical Record Number: [id number] Patient Account Number: [id number] Date of Birth/Sex: Treating RN: 06-26-1955 (65 y.o. [name],  [name] Primary Care Iaan Oregel: [name] Other Clinician: Referring Shaylinn Hladik: Treating Nica Friske/Extender: [name] in Treatment: 1 Vital Signs Time Taken: 14:12 Temperature (F): 98.5 Height (in): 60 Pulse (bpm): 73 Weight (lbs): 174 Respiratory Rate (breaths/min): 17 Body Mass Index (BMI): 34 Blood Pressure (mmHg): 124/86 Reference Range: 80 - 120 mg / dl Electronic Signature(s) Signed: 10/07/2020 5:24:19 PM By: Rhae Hammock RN Entered By: Rhae Hammock on 10/07/2020 14:13:05

## 2020-10-10 DIAGNOSIS — R69 Illness, unspecified: Secondary | ICD-10-CM | POA: Diagnosis not present

## 2020-10-10 DIAGNOSIS — F422 Mixed obsessional thoughts and acts: Secondary | ICD-10-CM | POA: Diagnosis not present

## 2020-10-14 ENCOUNTER — Encounter (HOSPITAL_BASED_OUTPATIENT_CLINIC_OR_DEPARTMENT_OTHER): Payer: Medicare HMO | Attending: Internal Medicine | Admitting: Internal Medicine

## 2020-10-14 ENCOUNTER — Other Ambulatory Visit: Payer: Self-pay

## 2020-10-14 DIAGNOSIS — A281 Cat-scratch disease: Secondary | ICD-10-CM | POA: Insufficient documentation

## 2020-10-14 DIAGNOSIS — E11622 Type 2 diabetes mellitus with other skin ulcer: Secondary | ICD-10-CM | POA: Diagnosis not present

## 2020-10-14 DIAGNOSIS — I890 Lymphedema, not elsewhere classified: Secondary | ICD-10-CM | POA: Diagnosis not present

## 2020-10-14 DIAGNOSIS — R609 Edema, unspecified: Secondary | ICD-10-CM | POA: Insufficient documentation

## 2020-10-14 DIAGNOSIS — L97228 Non-pressure chronic ulcer of left calf with other specified severity: Secondary | ICD-10-CM | POA: Insufficient documentation

## 2020-10-14 DIAGNOSIS — Z96652 Presence of left artificial knee joint: Secondary | ICD-10-CM | POA: Diagnosis not present

## 2020-10-14 DIAGNOSIS — L0889 Other specified local infections of the skin and subcutaneous tissue: Secondary | ICD-10-CM | POA: Diagnosis not present

## 2020-10-14 NOTE — Progress Notes (Signed)
MELAH, SCHUPBACH ([id number]) Visit Report for 10/14/2020 HPI Details Patient Name: Date of Service: [name], [name] 10/14/2020 2:00 PM Medical Record Number: [id number] Patient Account Number: [id number] Date of Birth/Sex: Treating RN: Mar 07, 1955 (65 y.o. [name], [name] Primary Care Provider: Scarlette Whitney Other Clinician: Referring Provider: Treating Provider/Extender: [name] in Treatment: 2 History of Present Illness HPI Description: ADMISSION 09/30/2020 This is a previously healthy 65 year old woman who is a type II diabetic but not on any current treatment. On 08/22/2020 she may have stepped on her pet cats tail and the cat violently attacked her biting her on her medial and lateral left lower leg. The cat is declined in the front. The patient was seen on 08/25/2020 by her primary physician diagnosed with cellulitis was given a shot of Rocephin and Augmentin for 7 days. The patient has had multiple visits since then including 09/03/2020 7/25 7/27 and most recently 8/12 mostly by her primary physician but also urgent care. She received a prolonged course of Augmentin, 7-day course of doxycycline and most recently an 8-day course of Nuzyra. In general looking at the pictures in epic and also the pictures on the patient's phone today things have gotten a lot better with intense angry erythema initially on the left medial lower leg with multiple small puncture wounds. Currently she has 1 small open area. There is an erythematous firm area medially. Everything is healed on the left although there is still some subcutaneous firmness to some of these areas. The patient stated that the maximum intensity of the infection she was running fevers in the mid 99 range but no other systemic symptoms. She still has 1 remaining small punched out area on the left medial calf. This still has some depth. There is firmness around this area which I think is probably subcutaneous  tissue necrosis and mild erythema but no tenderness or crepitus. 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 She has been on oral diuretics for edema in her leg. She tells me she was reviewed by cardiology a year ago ABI on the left leg was 0.98 10/07/2020; patient is back into clinic this week. Much better edema control in her left leg. The firm areas which I think were secondary to subcutaneous tissue necrosis all feel a lot better however she still has the small but deeply punched out wound on the left medial lower leg 9/6; the patient has 1 open area roughly 0.3 cm deep on the left lower leg. Much better condition of her skin. We have been using endoform under compression Electronic Signature(s) Signed: 10/14/2020 5:13:37 PM By: Linton Ham MD Entered By: Linton Ham on 10/14/2020 16:16:51 -------------------------------------------------------------------------------- Physical Exam Details Patient Name: Date of Service: [name] 10/14/2020 2:00 PM Medical Record Number: [id number] Patient Account Number: [id number] Date of Birth/Sex: Treating RN: [birth year] (65 y.o. [name], [name] Primary Care Provider: Scarlette Whitney Other Clinician: Referring Provider: Treating Provider/Extender: [name] in Treatment: 2 Constitutional Sitting or standing Blood Pressure is within target range for patient.. Pulse regular and within target range for patient.Marland Kitchen Respirations regular, non-labored and within target range.. Temperature is normal and within the target range for the patient.Marland Kitchen Appears in no distress. Cardiovascular Her edema control is much better. Notes Wound exam; 1 small open area remains on the left medial lower  leg. The original firm erythematous tissue around this is almost  completely resolved. There is no evidence of surrounding infection no tenderness. Everything looks much better there is no open areas on the left lateral leg Electronic Signature(s) Signed: 10/14/2020 5:13:37 PM By: Linton Ham MD Entered By: Linton Ham on 10/14/2020 16:18:44 -------------------------------------------------------------------------------- Physician Orders Details Patient Name: Date of Service: [name]. 10/14/2020 2:00 PM Medical Record Number: [id number] Patient Account Number: [id number] Date of Birth/Sex: Treating RN: 06/03/1955 (65 y.o. [name], [name] Primary Care Provider: Scarlette Whitney Other Clinician: Referring Provider: Treating Provider/Extender: [name] in Treatment: 2 Verbal / Phone Orders: No Diagnosis Coding Follow-up Appointments Return Appointment in 1 week. Bathing/ Shower/ Hygiene May shower with protection but do not get wound dressing(s) wet. - May use a cast wrap to protect the wrap. You can get these at [facility], [place], or [place]!!:) Edema Control - Lymphedema / SCD / Other Elevate legs to the level of the heart or above for 30 minutes daily and/or when sitting, a frequency of: Avoid standing for long periods of time. Patient to wear own compression stockings every day. Wound Treatment Wound #1 - Lower Leg Wound Laterality: Left, Medial Cleanser: Soap and Water 1 x Per Week/7 Days Discharge Instructions: May shower and wash wound with dial antibacterial soap and water prior to dressing change. Cleanser: Wound Cleanser (Generic) 1 x Per Week/7 Days Discharge Instructions: Cleanse the wound with wound cleanser prior to applying a clean dressing using gauze sponges, not tissue or cotton balls. Peri-Wound Care: Triamcinolone 15 (g) 1 x Per Week/7 Days Discharge Instructions: Use triamcinolone 15 (g) as directed Peri-Wound Care: Sween Lotion (Moisturizing lotion) 1 x Per Week/7 Days Discharge  Instructions: Apply moisturizing lotion as directed Prim Dressing: Endoform 2x2 in 1 x Per Week/7 Days ary Discharge Instructions: Moisten with saline Secondary Dressing: Woven Gauze Sponge, Non-Sterile 4x4 in (Generic) 1 x Per Week/7 Days Discharge Instructions: Apply over primary dressing as directed. Secondary Dressing: ABD Pad, 5x9 (Generic) 1 x Per Week/7 Days Discharge Instructions: Apply over primary dressing as directed. Compression Wrap: ThreePress (3 layer compression wrap) 1 x Per Week/7 Days Discharge Instructions: Apply three layer compression as directed. Electronic Signature(s) Signed: 10/14/2020 5:13:37 PM By: Linton Ham MD Signed: 10/14/2020 5:22:11 PM By: Rhae Hammock RN Entered By: Rhae Hammock on 10/14/2020 15:28:05 -------------------------------------------------------------------------------- Problem List Details Patient Name: Date of Service: [name] 10/14/2020 2:00 PM Medical Record Number: [id number] Patient Account Number: [id number] Date of Birth/Sex: Treating RN: 1955-12-22 (65 y.o. [name], [name] Primary Care Provider: Scarlette Whitney Other Clinician: Referring Provider: Treating Provider/Extender: [name] in Treatment: 2 Active Problems ICD-10 Encounter Code Description Active Date MDM Diagnosis L97.228 Non-pressure chronic ulcer of left calf with other specified severity 09/30/2020 No Yes I89.0 Lymphedema, not elsewhere classified 09/30/2020 No Yes A28.1 Cat-scratch disease 09/30/2020 No Yes E11.622 Type 2 diabetes mellitus with other skin ulcer 09/30/2020 No Yes Inactive Problems Resolved Problems Electronic Signature(s) Signed: 10/14/2020 5:13:37 PM By: Linton Ham MD Entered By: Linton Ham on 10/14/2020 16:05:19 -------------------------------------------------------------------------------- Progress Note Details Patient Name: Date of Service: [name] 10/14/2020 2:00 PM Medical  Record Number: [id number] Patient Account Number: [id number] Date of Birth/Sex: Treating RN: Jun 05, 1955 (65 y.o. [name], [name] Primary Care Provider: Scarlette Whitney Other Clinician: Referring Provider: Treating Provider/Extender: [name] in Treatment: 2 Subjective History of Present Illness (HPI) ADMISSION 09/30/2020 This is a previously healthy 65 year old woman who  is a type II diabetic but not on any current treatment. On 08/22/2020 she may have stepped on her pet cats tail and the cat violently attacked her biting her on her medial and lateral left lower leg. The cat is declined in the front. The patient was seen on 08/25/2020 by her primary physician diagnosed with cellulitis was given a shot of Rocephin and Augmentin for 7 days. The patient has had multiple visits since then including 09/03/2020 7/25 7/27 and most recently 8/12 mostly by her primary physician but also urgent care. She received a prolonged course of Augmentin, 7-day course of doxycycline and most recently an 8-day course of Nuzyra. In general looking at the pictures in epic and also the pictures on the patient's phone today things have gotten a lot better with intense angry erythema initially on the left medial lower leg with multiple small puncture wounds. Currently she has 1 small open area. There is an erythematous firm area medially. Everything is healed on the left although there is still some subcutaneous firmness to some of these areas. The patient stated that the maximum intensity of the infection she was running fevers in the mid 99 range but no other systemic symptoms. She still has 1 remaining small punched out area on the left medial calf. This still has some depth. There is firmness around this area which I think is probably subcutaneous tissue necrosis and mild erythema but no tenderness or crepitus. 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 She has been on oral diuretics for edema in her leg. She tells me she was reviewed by cardiology a year ago ABI on the left leg was 0.98 10/07/2020; patient is back into clinic this week. Much better edema control in her left leg. The firm areas which I think were secondary to subcutaneous tissue necrosis all feel a lot better however she still has the small but deeply punched out wound on the left medial lower leg 9/6; the patient has 1 open area roughly 0.3 cm deep on the left lower leg. Much better condition of her skin. We have been using endoform under compression Objective Constitutional Sitting or standing Blood Pressure is within target range for patient.. Pulse regular and within target range for patient.Marland Kitchen Respirations regular, non-labored and within target range.. Temperature is normal and within the target range for the patient.Marland Kitchen Appears in no distress. Vitals Time Taken: 3:02 PM, Height: 60 in, Weight: 174 lbs, BMI: 34, Temperature: 98.0 F, Pulse: 67 bpm, Respiratory Rate: 17 breaths/min, Blood Pressure: 120/82 mmHg. Cardiovascular Her edema control is much better. General Notes: Wound exam; 1 small open area remains on the left medial lower leg. The original firm erythematous tissue around this is almost completely resolved. There is no evidence of surrounding infection no tenderness. Everything looks much better there is no open areas on the left lateral leg Integumentary (Hair, Skin) Wound #1 status is Open. Original cause of wound was Trauma. The date acquired was: 08/22/2020. The wound has been in treatment 2 weeks. The wound is located on the Left,Medial Lower Leg. The wound measures 0.3cm length x 0.4cm width x 0.3cm depth; 0.094cm^2 area and 0.028cm^3 volume. There is Fat Layer (Subcutaneous Tissue) exposed. There is a medium amount of sanguinous  drainage noted. The wound margin is well defined and not attached to the wound base. There  is large (67-100%) red granulation within the wound bed. There is a small (1-33%) amount of necrotic tissue within the wound bed including Adherent Slough. Assessment Active Problems ICD-10 Non-pressure chronic ulcer of left calf with other specified severity Lymphedema, not elsewhere classified Cat-scratch disease Type 2 diabetes mellitus with other skin ulcer Procedures Wound #1 Pre-procedure diagnosis of Wound #1 is an Infection - not elsewhere classified located on the Left,Medial Lower Leg . There was a Three Layer Compression Therapy Procedure by Rhae Hammock, RN. Post procedure Diagnosis Wound #1: Same as Pre-Procedure Plan Follow-up Appointments: Return Appointment in 1 week. Bathing/ Shower/ Hygiene: May shower with protection but do not get wound dressing(s) wet. - May use a cast wrap to protect the wrap. You can get these at [facility], [place], or [place]!!:) Edema Control - Lymphedema / SCD / Other: Elevate legs to the level of the heart or above for 30 minutes daily and/or when sitting, a frequency of: Avoid standing for long periods of time. Patient to wear own compression stockings every day. WOUND #1: - Lower Leg Wound Laterality: Left, Medial Cleanser: Soap and Water 1 x Per Week/7 Days Discharge Instructions: May shower and wash wound with dial antibacterial soap and water prior to dressing change. Cleanser: Wound Cleanser (Generic) 1 x Per Week/7 Days Discharge Instructions: Cleanse the wound with wound cleanser prior to applying a clean dressing using gauze sponges, not tissue or cotton balls. Peri-Wound Care: Triamcinolone 15 (g) 1 x Per Week/7 Days Discharge Instructions: Use triamcinolone 15 (g) as directed Peri-Wound Care: Sween Lotion (Moisturizing lotion) 1 x Per Week/7 Days Discharge Instructions: Apply moisturizing lotion as directed Prim Dressing: Endoform 2x2 in 1  x Per Week/7 Days ary Discharge Instructions: Moisten with saline Secondary Dressing: Woven Gauze Sponge, Non-Sterile 4x4 in (Generic) 1 x Per Week/7 Days Discharge Instructions: Apply over primary dressing as directed. Secondary Dressing: ABD Pad, 5x9 (Generic) 1 x Per Week/7 Days Discharge Instructions: Apply over primary dressing as directed. Com pression Wrap: ThreePress (3 layer compression wrap) 1 x Per Week/7 Days Discharge Instructions: Apply three layer compression as directed. 1. Considerable improvement. 2. Still using endoform and the small remaining wound area still under compression 3. May be closed by next week 4. I think this actually caused a necrotizing subcutaneous infection whether this was cat scratch disease or not it is largely irrelevant at this point. I think this explains the condition of her leg on arrival Electronic Signature(s) Signed: 10/14/2020 5:13:37 PM By: Linton Ham MD Entered By: Linton Ham on 10/14/2020 16:20:04 -------------------------------------------------------------------------------- SuperBill Details Patient Name: Date of Service: [name] 10/14/2020 Medical Record Number: [id number] Patient Account Number: [id number] Date of Birth/Sex: Treating RN: 10-Jan-1956 (65 y.o. [name], [name] Primary Care Provider: Scarlette Whitney Other Clinician: Referring Provider: Treating Provider/Extender: [name] in Treatment: 2 Diagnosis Coding ICD-10 Codes Code Description [phone] Non-pressure chronic ulcer of left calf with other specified severity I89.0 Lymphedema, not elsewhere classified A28.1 Cat-scratch disease E11.622 Type 2 diabetes mellitus with other skin ulcer Facility Procedures CPT4 Code: [id number] Description: (Facility Use Only) [phone] - [facility] LEG Modifier: Quantity: 1 Physician Procedures : CPT4 Code Description Modifier [id number] - WC PHYS LEVEL 3 - EST PT  ICD-10 Diagnosis Description L97.228 Non-pressure chronic ulcer of left calf with other specified severity I89.0 Lymphedema, not elsewhere classified Quantity: 1 Electronic Signature(s) Signed: 10/14/2020 5:13:37 PM By: Linton Ham MD Entered By: Linton Ham on 10/14/2020 16:20:24

## 2020-10-16 DIAGNOSIS — R490 Dysphonia: Secondary | ICD-10-CM | POA: Diagnosis not present

## 2020-10-16 DIAGNOSIS — K219 Gastro-esophageal reflux disease without esophagitis: Secondary | ICD-10-CM | POA: Diagnosis not present

## 2020-10-16 NOTE — Progress Notes (Signed)
[name], [name] ([id number]) Visit Report for 10/14/2020 Arrival Information Details Patient Name: Date of Service: [name], [name] 10/14/2020 2:00 PM Medical Record Number: [id number] Patient Account Number: [id number] Date of Birth/Sex: Treating RN: 05/26/1955 (65 y.o. [name], [name] Primary Care Keshan Reha: [name] Other Clinician: Referring Salathiel Ferrara: Treating Colyn Miron/Extender: [name] in Treatment: 2 Visit Information History Since Last Visit Added or deleted any medications: No Patient Arrived: Ambulatory Any new allergies or adverse reactions: No Arrival Time: 15:02 Had a fall or experienced change in No Accompanied By: self activities of daily living that may affect Transfer Assistance: None risk of falls: Patient Identification Verified: Yes Signs or symptoms of abuse/neglect since last visito No Secondary Verification Process Completed: Yes Hospitalized since last visit: No Patient Requires Transmission-Based Precautions: No Implantable device outside of the clinic excluding No Patient Has Alerts: Yes cellular tissue based products placed in the center Patient Alerts: *No Lidocaine* since last visit: Has Dressing in Place as Prescribed: Yes Pain Present Now: No Electronic Signature(s) Signed: 10/16/2020 11:10:38 AM By: Sandre Kitty Entered By: Sandre Kitty on 10/14/2020 15:02:34 -------------------------------------------------------------------------------- Compression Therapy Details Patient Name: Date of Service: [name] 10/14/2020 2:00 PM Medical Record Number: [id number] Patient Account Number: [id number] Date of Birth/Sex: Treating RN: Apr 14, 1955 (65 y.o. [name], [name] Primary Care Win Guajardo: [name] Other Clinician: Referring Develle Sievers: Treating Siddhanth Denk/Extender: [name] in Treatment: 2 Compression Therapy Performed for Wound Assessment: Wound #1 Left,Medial Lower  Leg Performed By: Clinician Rhae Hammock, RN Compression Type: Three Layer Post Procedure Diagnosis Same as Pre-procedure Electronic Signature(s) Signed: 10/14/2020 5:22:11 PM By: Rhae Hammock RN Entered By: Rhae Hammock on 10/14/2020 15:29:34 -------------------------------------------------------------------------------- Encounter Discharge Information Details Patient Name: Date of Service: [name] 10/14/2020 2:00 PM Medical Record Number: [id number] Patient Account Number: [id number] Date of Birth/Sex: Treating RN: 1955/10/17 (65 y.o. [name], [name] Primary Care Ashly Goethe: [name] Other Clinician: Referring Denzal Meir: Treating Kaziyah Parkison/Extender: [name] in Treatment: 2 Encounter Discharge Information Items Discharge Condition: Stable Ambulatory Status: Ambulatory Discharge Destination: Home Transportation: Private Auto Accompanied By: self Schedule Follow-up Appointment: Yes Clinical Summary of Care: Electronic Signature(s) Signed: 10/14/2020 5:22:11 PM By: Rhae Hammock RN Entered By: Rhae Hammock on 10/14/2020 15:41:37 -------------------------------------------------------------------------------- Lower Extremity Assessment Details Patient Name: Date of Service: [name] 10/14/2020 2:00 PM Medical Record Number: [id number] Patient Account Number: [id number] Date of Birth/Sex: Treating RN: Oct 13, 1955 (65 y.o. [name], [name] Primary Care Corie Allis: [name] Other Clinician: Referring Tyquarius Paglia: Treating Elizibeth Breau/Extender: [name] in Treatment: 2 Edema Assessment Assessed: Shirlyn Goltz: Yes] Patrice Paradise: No] Edema: [Left: Ye] [Right: s] Calf Left: Right: Point of Measurement: 25 cm From Medial Instep 38.5 cm Ankle Left: Right: Point of Measurement: 7 cm From Medial Instep 23.5 cm Vascular Assessment Pulses: Dorsalis Pedis Palpable: [Left:Yes] Posterior  Tibial Palpable: [Left:Yes] Electronic Signature(s) Signed: 10/14/2020 5:22:11 PM By: Rhae Hammock RN Entered By: Rhae Hammock on 10/14/2020 15:22:11 -------------------------------------------------------------------------------- Multi Wound Chart Details Patient Name: Date of Service: [name] 10/14/2020 2:00 PM Medical Record Number: [id number] Patient Account Number: [id number] Date of Birth/Sex: Treating RN: 15-Feb-1955 (65 y.o. [name], [name] Primary Care Mete Purdum: [name] Other Clinician: Referring Elinda Bunten: Treating Tabitha Riggins/Extender: [name] in Treatment: 2 Vital Signs Height(in): 60 Pulse(bpm): 45 Weight(lbs): 174 Blood Pressure(mmHg): 120/82 Body Mass Index(BMI): 34 Temperature(F): 98.0 Respiratory Rate(breaths/min): 17 Photos: [N/A:N/A] Left, Medial Lower Leg N/A N/A Wound Location: Trauma N/A N/A  Wounding Event: Infection - not elsewhere classified N/A N/A Primary Etiology: Cataracts, Middle ear problems, N/A N/A Comorbid History: Asthma, Sleep Apnea, Hypertension, Type II Diabetes, Osteoarthritis, Confinement Anxiety 08/22/2020 N/A N/A Date Acquired: 2 N/A N/A Weeks of Treatment: Open N/A N/A Wound Status: 0.3x0.4x0.3 N/A N/A Measurements L x W x D (cm) 0.094 N/A N/A A (cm) : rea 0.028 N/A N/A Volume (cm) : 60.20% N/A N/A % Reduction in Area: 70.20% N/A N/A % Reduction in Volume: Full Thickness Without Exposed N/A N/A Classification: Support Structures Medium N/A N/A Exudate Amount: Sanguinous N/A N/A Exudate Type: red N/A N/A Exudate Color: Well defined, not attached N/A N/A Wound Margin: Large (67-100%) N/A N/A Granulation Amount: Red N/A N/A Granulation Quality: Small (1-33%) N/A N/A Necrotic Amount: Fat Layer (Subcutaneous Tissue): Yes N/A N/A Exposed Structures: Fascia: No Tendon: No Muscle: No Joint: No Bone: No None N/A N/A Epithelialization: Compression  Therapy N/A N/A Procedures Performed: Treatment Notes Wound #1 (Lower Leg) Wound Laterality: Left, Medial Cleanser Soap and Water Discharge Instruction: May shower and wash wound with dial antibacterial soap and water prior to dressing change. Wound Cleanser Discharge Instruction: Cleanse the wound with wound cleanser prior to applying a clean dressing using gauze sponges, not tissue or cotton balls. Peri-Wound Care Triamcinolone 15 (g) Discharge Instruction: Use triamcinolone 15 (g) as directed Sween Lotion (Moisturizing lotion) Discharge Instruction: Apply moisturizing lotion as directed Topical Primary Dressing Endoform 2x2 in Discharge Instruction: Moisten with saline Secondary Dressing Woven Gauze Sponge, Non-Sterile 4x4 in Discharge Instruction: Apply over primary dressing as directed. ABD Pad, 5x9 Discharge Instruction: Apply over primary dressing as directed. Secured With Compression Wrap ThreePress (3 layer compression wrap) Discharge Instruction: Apply three layer compression as directed. Compression Stockings Add-Ons Electronic Signature(s) Signed: 10/14/2020 5:13:37 PM By: Linton Ham MD Signed: 10/14/2020 5:22:11 PM By: Rhae Hammock RN Entered By: Linton Ham on 10/14/2020 16:05:27 -------------------------------------------------------------------------------- Multi-Disciplinary Care Plan Details Patient Name: Date of Service: [name] 10/14/2020 2:00 PM Medical Record Number: [id number] Patient Account Number: [id number] Date of Birth/Sex: Treating RN: 03-15-1955 (65 y.o. [name], [name] Primary Care Keyetta Hollingworth: [name] Other Clinician: Referring Meredeth Furber: Treating Surena Welge/Extender: [name] in Treatment: 2 Active Inactive Orientation to the Wound Care Program Nursing Diagnoses: Knowledge deficit related to the wound healing center program Goals: Patient/caregiver will verbalize understanding of  the [facility] Date Initiated: 09/30/2020 Target Resolution Date: 10/30/2020 Goal Status: Active Interventions: Provide education on orientation to the wound center Notes: Wound/Skin Impairment Nursing Diagnoses: Impaired tissue integrity Knowledge deficit related to ulceration/compromised skin integrity Goals: Patient will have a decrease in wound volume by X% from date: (specify in notes) Date Initiated: 09/30/2020 Target Resolution Date: 10/31/2020 Goal Status: Active Patient/caregiver will verbalize understanding of skin care regimen Date Initiated: 09/30/2020 Target Resolution Date: 11/01/2020 Goal Status: Active Ulcer/skin breakdown will have a volume reduction of 30% by week 4 Date Initiated: 09/30/2020 Target Resolution Date: 11/02/2020 Goal Status: Active Interventions: Assess patient/caregiver ability to obtain necessary supplies Assess patient/caregiver ability to perform ulcer/skin care regimen upon admission and as needed Assess ulceration(s) every visit Notes: Electronic Signature(s) Signed: 10/14/2020 5:22:11 PM By: Rhae Hammock RN Entered By: Rhae Hammock on 10/14/2020 15:28:20 -------------------------------------------------------------------------------- Pain Assessment Details Patient Name: Date of Service: [name] 10/14/2020 2:00 PM Medical Record Number: [id number] Patient Account Number: [id number] Date of Birth/Sex: Treating RN: [birth year] (65 y.o. [name], [name] Primary Care Nathania Waldman: [name] Other Clinician: Referring Jamale Spangler: Treating Torrey Horseman/Extender: Linton Ham  [name] Weeks in Treatment: 2 Active Problems Location of Pain Severity and Description of Pain Patient Has Paino No Site Locations Pain Management and Medication Current Pain Management: Electronic Signature(s) Signed: 10/14/2020 5:22:11 PM By: Rhae Hammock RN Signed: 10/16/2020 11:10:38 AM By: Sandre Kitty Entered By:  Sandre Kitty on 10/14/2020 15:03:03 -------------------------------------------------------------------------------- Patient/Caregiver Education Details Patient Name: Date of Service: [name] 9/6/2022andnbsp2:00 PM Medical Record Number: [id number] Patient Account Number: [id number] Date of Birth/Gender: Treating RN: 1955-09-21 (65 y.o. [name], [name] Primary Care Physician: [name] Other Clinician: Referring Physician: Treating Physician/Extender: [name] in Treatment: 2 Education Assessment Education Provided To: Patient Education Topics Provided Welcome T [facility]: o Electronic Signature(s) Signed: 10/14/2020 5:22:11 PM By: Rhae Hammock RN Entered By: Rhae Hammock on 10/14/2020 15:28:50 -------------------------------------------------------------------------------- Wound Assessment Details Patient Name: Date of Service: [name] 10/14/2020 2:00 PM Medical Record Number: [id number] Patient Account Number: [id number] Date of Birth/Sex: Treating RN: 1955/05/23 (65 y.o. [name], [name] Primary Care Qiara Minetti: [name] Other Clinician: Referring Etrulia Zarr: Treating Shamera Yarberry/Extender: [name] in Treatment: 2 Wound Status Wound Number: 1 Primary Infection - not elsewhere classified Etiology: Wound Location: Left, Medial Lower Leg Wound Open Wounding Event: Trauma Status: Date Acquired: 08/22/2020 Comorbid Cataracts, Middle ear problems, Asthma, Sleep Apnea, Weeks Of Treatment: 2 History: Hypertension, Type II Diabetes, Osteoarthritis, Confinement Clustered Wound: No Anxiety Photos Wound Measurements Length: (cm) 0.3 Width: (cm) 0.4 Depth: (cm) 0.3 Area: (cm) 0.094 Volume: (cm) 0.028 % Reduction in Area: 60.2% % Reduction in Volume: 70.2% Epithelialization: None Wound Description Classification: Full Thickness Without Exposed Support  Structu Wound Margin: Well defined, not attached Exudate Amount: Medium Exudate Type: Sanguinous Exudate Color: red Wound Bed Granulation Amount: Large (67-100%) Granulation Quality: Red Necrotic Amount: Small (1-33%) Necrotic Quality: Adherent Slough res Foul Odor After Cleansing: No Slough/Fibrino Yes Exposed Structure Fascia Exposed: No Fat Layer (Subcutaneous Tissue) Exposed: Yes Tendon Exposed: No Muscle Exposed: No Joint Exposed: No Bone Exposed: No Treatment Notes Wound #1 (Lower Leg) Wound Laterality: Left, Medial Cleanser Soap and Water Discharge Instruction: May shower and wash wound with dial antibacterial soap and water prior to dressing change. Wound Cleanser Discharge Instruction: Cleanse the wound with wound cleanser prior to applying a clean dressing using gauze sponges, not tissue or cotton balls. Peri-Wound Care Triamcinolone 15 (g) Discharge Instruction: Use triamcinolone 15 (g) as directed Sween Lotion (Moisturizing lotion) Discharge Instruction: Apply moisturizing lotion as directed Topical Primary Dressing Endoform 2x2 in Discharge Instruction: Moisten with saline Secondary Dressing Woven Gauze Sponge, Non-Sterile 4x4 in Discharge Instruction: Apply over primary dressing as directed. ABD Pad, 5x9 Discharge Instruction: Apply over primary dressing as directed. Secured With Compression Wrap ThreePress (3 layer compression wrap) Discharge Instruction: Apply three layer compression as directed. Compression Stockings Add-Ons Electronic Signature(s) Signed: 10/14/2020 5:22:11 PM By: Rhae Hammock RN Signed: 10/16/2020 11:10:38 AM By: Sandre Kitty Entered By: Sandre Kitty on 10/14/2020 15:08:12 -------------------------------------------------------------------------------- Vitals Details Patient Name: Date of Service: [name]. 10/14/2020 2:00 PM Medical Record Number: [id number] Patient Account Number: [id number] Date of  Birth/Sex: Treating RN: 1955-10-31 (65 y.o. [name], [name] Primary Care Gift Rueckert: [name] Other Clinician: Referring Aanika Defoor: Treating Keymari Sato/Extender: [name] in Treatment: 2 Vital Signs Time Taken: 15:02 Temperature (F): 98.0 Height (in): 60 Pulse (bpm): 67 Weight (lbs): 174 Respiratory Rate (breaths/min): 17 Body Mass Index (BMI): 34 Blood Pressure (mmHg): 120/82 Reference Range: 80 - 120 mg / dl  Electronic Signature(s) Signed: 10/16/2020 11:10:38 AM By: Sandre Kitty Entered By: Sandre Kitty on 10/14/2020 15:02:53

## 2020-10-17 ENCOUNTER — Encounter: Payer: Self-pay | Admitting: Gastroenterology

## 2020-10-21 ENCOUNTER — Encounter (HOSPITAL_BASED_OUTPATIENT_CLINIC_OR_DEPARTMENT_OTHER): Payer: Medicare HMO | Admitting: Internal Medicine

## 2020-10-21 DIAGNOSIS — E11622 Type 2 diabetes mellitus with other skin ulcer: Secondary | ICD-10-CM | POA: Diagnosis not present

## 2020-10-21 DIAGNOSIS — R609 Edema, unspecified: Secondary | ICD-10-CM | POA: Diagnosis not present

## 2020-10-21 DIAGNOSIS — Z96652 Presence of left artificial knee joint: Secondary | ICD-10-CM | POA: Diagnosis not present

## 2020-10-21 DIAGNOSIS — L97228 Non-pressure chronic ulcer of left calf with other specified severity: Secondary | ICD-10-CM | POA: Diagnosis not present

## 2020-10-21 DIAGNOSIS — L97822 Non-pressure chronic ulcer of other part of left lower leg with fat layer exposed: Secondary | ICD-10-CM | POA: Diagnosis not present

## 2020-10-21 DIAGNOSIS — L0889 Other specified local infections of the skin and subcutaneous tissue: Secondary | ICD-10-CM | POA: Diagnosis not present

## 2020-10-21 DIAGNOSIS — I890 Lymphedema, not elsewhere classified: Secondary | ICD-10-CM | POA: Diagnosis not present

## 2020-10-21 DIAGNOSIS — A281 Cat-scratch disease: Secondary | ICD-10-CM | POA: Diagnosis not present

## 2020-10-22 ENCOUNTER — Other Ambulatory Visit: Payer: Self-pay | Admitting: Internal Medicine

## 2020-10-22 DIAGNOSIS — E785 Hyperlipidemia, unspecified: Secondary | ICD-10-CM

## 2020-10-22 DIAGNOSIS — M810 Age-related osteoporosis without current pathological fracture: Secondary | ICD-10-CM

## 2020-10-22 DIAGNOSIS — E039 Hypothyroidism, unspecified: Secondary | ICD-10-CM

## 2020-10-22 MED ORDER — ROSUVASTATIN CALCIUM 20 MG PO TABS: 20.0000 mg | ORAL_TABLET | Freq: Every day | ORAL | 1 refills | Status: DC

## 2020-10-22 MED ORDER — LEVOTHYROXINE SODIUM 50 MCG PO TABS: 50.0000 ug | ORAL_TABLET | Freq: Every day | ORAL | 0 refills | Status: DC

## 2020-10-22 NOTE — Progress Notes (Signed)
FINA, FREAD ([id number]) Visit Report for 10/21/2020 Arrival Information Details Patient Name: Date of Service: [name], [name] 10/21/2020 10:30 A M Medical Record Number: [id number] Patient Account Number: [id number] Date of Birth/Sex: Treating RN: 1955/02/21 (65 y.o. [name], [name] Primary Care Keiandre Cygan: [name] Other Clinician: Referring Lexus Barletta: Treating Lalah Durango/Extender: [name] in Treatment: 3 Visit Information History Since Last Visit Added or deleted any medications: No Patient Arrived: Ambulatory Any new allergies or adverse reactions: No Arrival Time: 10:36 Had a fall or experienced change in No Accompanied By: self activities of daily living that may affect Transfer Assistance: None risk of falls: Patient Identification Verified: Yes Signs or symptoms of abuse/neglect since last visito No Secondary Verification Process Completed: Yes Hospitalized since last visit: No Patient Requires Transmission-Based Precautions: No Implantable device outside of the clinic excluding No Patient Has Alerts: Yes cellular tissue based products placed in the center Patient Alerts: *No Lidocaine* since last visit: Has Dressing in Place as Prescribed: Yes Pain Present Now: Yes Electronic Signature(s) Signed: 10/21/2020 10:48:45 AM By: Sandre Kitty Entered By: Sandre Kitty on 10/21/2020 10:37:40 -------------------------------------------------------------------------------- Compression Therapy Details Patient Name: Date of Service: [name] 10/21/2020 10:30 A M Medical Record Number: [id number] Patient Account Number: [id number] Date of Birth/Sex: Treating RN: [birth year] (65 y.o. [name], [name] Primary Care Adella Manolis: [name] Other Clinician: Referring Tinslee Klare: Treating Hortense Cantrall/Extender: [name] in Treatment: 3 Compression Therapy Performed for Wound Assessment: Wound #1  Left,Medial Lower Leg Performed By: Clinician Rhae Hammock, RN Compression Type: Three Layer Post Procedure Diagnosis Same as Pre-procedure Electronic Signature(s) Signed: 10/21/2020 5:15:03 PM By: Rhae Hammock RN Entered By: Rhae Hammock on 10/21/2020 11:19:34 -------------------------------------------------------------------------------- Encounter Discharge Information Details Patient Name: Date of Service: [name]. 10/21/2020 10:30 A M Medical Record Number: [id number] Patient Account Number: [id number] Date of Birth/Sex: Treating RN: 03-08-1955 (65 y.o. [name], [name] Primary Care Deane Wattenbarger: [name] Other Clinician: Referring Simren Popson: Treating Lynanne Delgreco/Extender: [name] in Treatment: 3 Encounter Discharge Information Items Post Procedure Vitals Discharge Condition: Stable Temperature (F): 98.7 Ambulatory Status: Ambulatory Pulse (bpm): 74 Discharge Destination: Home Respiratory Rate (breaths/min): 17 Transportation: Private Auto Blood Pressure (mmHg): 134/74 Accompanied By: self Schedule Follow-up Appointment: Yes Clinical Summary of Care: Patient Declined Electronic Signature(s) Signed: 10/21/2020 5:15:03 PM By: Rhae Hammock RN Entered By: Rhae Hammock on 10/21/2020 11:36:13 -------------------------------------------------------------------------------- Lower Extremity Assessment Details Patient Name: Date of Service: [name]. 10/21/2020 10:30 A M Medical Record Number: [id number] Patient Account Number: [id number] Date of Birth/Sex: Treating RN: 20-Dec-1955 (65 y.o. [name], [name] Primary Care Braeden Kennan: [name] Other Clinician: Referring Tarius Stangelo: Treating Zamyia Gowell/Extender: [name] in Treatment: 3 Edema Assessment Assessed: Shirlyn Goltz: No] Patrice Paradise: No] Edema: [Left: Ye] [Right: s] Calf Left: Right: Point of Measurement: 25 cm From Medial  Instep 38.5 cm Ankle Left: Right: Point of Measurement: 7 cm From Medial Instep 23.5 cm Electronic Signature(s) Signed: 10/21/2020 5:15:03 PM By: Rhae Hammock RN Entered By: Rhae Hammock on 10/21/2020 11:18:18 -------------------------------------------------------------------------------- Multi Wound Chart Details Patient Name: Date of Service: [name]. 10/21/2020 10:30 A M Medical Record Number: [id number] Patient Account Number: [id number] Date of Birth/Sex: Treating RN: 1955-12-23 (65 y.o. [name], [name] Primary Care Beckam Abdulaziz: [name] Other Clinician: Referring Defne Gerling: Treating Kaydin Karbowski/Extender: [name] in Treatment: 3 Vital Signs Height(in): 60 Pulse(bpm): 98 Weight(lbs): 174 Blood Pressure(mmHg): 118/80 Body Mass Index(BMI): 34 Temperature(F): 98.8 Respiratory Rate(breaths/min): 17  Photos: [N/A:N/A] Left, Medial Lower Leg N/A N/A Wound Location: Trauma N/A N/A Wounding Event: Infection - not elsewhere classified N/A N/A Primary Etiology: Cataracts, Middle ear problems, N/A N/A Comorbid History: Asthma, Sleep Apnea, Hypertension, Type II Diabetes, Osteoarthritis, Confinement Anxiety 08/22/2020 N/A N/A Date Acquired: 3 N/A N/A Weeks of Treatment: Open N/A N/A Wound Status: 0.3x0.3x0.2 N/A N/A Measurements L x W x D (cm) 0.071 N/A N/A A (cm) : rea 0.014 N/A N/A Volume (cm) : 69.90% N/A N/A % Reduction in A rea: 85.10% N/A N/A % Reduction in Volume: Full Thickness Without Exposed N/A N/A Classification: Support Structures Medium N/A N/A Exudate A mount: Sanguinous N/A N/A Exudate Type: red N/A N/A Exudate Color: Well defined, not attached N/A N/A Wound Margin: Large (67-100%) N/A N/A Granulation A mount: Red N/A N/A Granulation Quality: Small (1-33%) N/A N/A Necrotic A mount: Fat Layer (Subcutaneous Tissue): Yes N/A N/A Exposed Structures: Fascia: No Tendon: No Muscle:  No Joint: No Bone: No None N/A N/A Epithelialization: Debridement - Excisional N/A N/A Debridement: Pre-procedure Verification/Time Out 11:24 N/A N/A Taken: Lidocaine N/A N/A Pain Control: Subcutaneous N/A N/A Tissue Debrided: Skin/Subcutaneous Tissue N/A N/A Level: 0.09 N/A N/A Debridement A (sq cm): rea Curette N/A N/A Instrument: Minimum N/A N/A Bleeding: Pressure N/A N/A Hemostasis A chieved: 0 N/A N/A Procedural Pain: 0 N/A N/A Post Procedural Pain: Procedure was tolerated well N/A N/A Debridement Treatment Response: 0.3x0.3x0.2 N/A N/A Post Debridement Measurements L x W x D (cm) 0.014 N/A N/A Post Debridement Volume: (cm) Compression Therapy N/A N/A Procedures Performed: Debridement Treatment Notes Wound #1 (Lower Leg) Wound Laterality: Left, Medial Cleanser Soap and Water Discharge Instruction: May shower and wash wound with dial antibacterial soap and water prior to dressing change. Wound Cleanser Discharge Instruction: Cleanse the wound with wound cleanser prior to applying a clean dressing using gauze sponges, not tissue or cotton balls. Peri-Wound Care Triamcinolone 15 (g) Discharge Instruction: Use triamcinolone 15 (g) as directed Sween Lotion (Moisturizing lotion) Discharge Instruction: Apply moisturizing lotion as directed Topical Primary Dressing Endoform 2x2 in Discharge Instruction: Moisten with saline Secondary Dressing Woven Gauze Sponge, Non-Sterile 4x4 in Discharge Instruction: Apply over primary dressing as directed. ABD Pad, 5x9 Discharge Instruction: Apply over primary dressing as directed. Secured With Compression Wrap ThreePress (3 layer compression wrap) Discharge Instruction: Apply three layer compression as directed. Compression Stockings Add-Ons Electronic Signature(s) Signed: 10/21/2020 5:15:03 PM By: Rhae Hammock RN Signed: 10/22/2020 3:40:47 PM By: Linton Ham MD Entered By: Linton Ham on 10/21/2020  12:26:27 -------------------------------------------------------------------------------- Multi-Disciplinary Care Plan Details Patient Name: Date of Service: [name] 10/21/2020 10:30 A M Medical Record Number: [id number] Patient Account Number: [id number] Date of Birth/Sex: Treating RN: 1955/07/01 (65 y.o. [name], [name] Primary Care Cayne Yom: [name] Other Clinician: Referring Tisa Weisel: Treating Breeana Sawtelle/Extender: [name] in Treatment: 3 Active Inactive Orientation to the Wound Care Program Nursing Diagnoses: Knowledge deficit related to the wound healing center program Goals: Patient/caregiver will verbalize understanding of the [place] Date Initiated: 09/30/2020 Target Resolution Date: 10/31/2020 Goal Status: Active Interventions: Provide education on orientation to the wound center Notes: Wound/Skin Impairment Nursing Diagnoses: Impaired tissue integrity Knowledge deficit related to ulceration/compromised skin integrity Goals: Patient will have a decrease in wound volume by X% from date: (specify in notes) Date Initiated: 09/30/2020 Target Resolution Date: 11/01/2020 Goal Status: Active Patient/caregiver will verbalize understanding of skin care regimen Date Initiated: 09/30/2020 Target Resolution Date: 11/02/2020 Goal Status: Active Ulcer/skin breakdown will have a  volume reduction of 30% by week 4 Date Initiated: 09/30/2020 Target Resolution Date: 11/03/2020 Goal Status: Active Interventions: Assess patient/caregiver ability to obtain necessary supplies Assess patient/caregiver ability to perform ulcer/skin care regimen upon admission and as needed Assess ulceration(s) every visit Notes: Electronic Signature(s) Signed: 10/21/2020 5:15:03 PM By: Rhae Hammock RN Entered By: Rhae Hammock on 10/21/2020  11:18:38 -------------------------------------------------------------------------------- Pain Assessment Details Patient Name: Date of Service: [name]. 10/21/2020 10:30 A M Medical Record Number: [id number] Patient Account Number: [id number] Date of Birth/Sex: Treating RN: 1955-10-16 (65 y.o. [name], [name] Primary Care Somnang Mahan: [name] Other Clinician: Referring Aneisha Skyles: Treating Zyra Parrillo/Extender: [name] in Treatment: 3 Active Problems Location of Pain Severity and Description of Pain Patient Has Paino Yes Site Locations Rate the pain. Current Pain Level: 3 Pain Management and Medication Current Pain Management: Electronic Signature(s) Signed: 10/21/2020 10:48:45 AM By: Sandre Kitty Signed: 10/21/2020 5:15:03 PM By: Rhae Hammock RN Entered By: Sandre Kitty on 10/21/2020 10:38:18 -------------------------------------------------------------------------------- Patient/Caregiver Education Details Patient Name: Date of Service: [name] 9/13/2022andnbsp10:30 [place] Record Number: [id number] Patient Account Number: [id number] Date of Birth/Gender: Treating RN: 10-Feb-1955 (65 y.o. [name], [name] Primary Care Physician: [name] Other Clinician: Referring Physician: Treating Physician/Extender: [name] in Treatment: 3 Education Assessment Education Provided To: Patient Education Topics Provided Welcome T [facility] [place]: o Methods: Explain/Verbal Responses: State content correctly Electronic Signature(s) Signed: 10/21/2020 5:15:03 PM By: Rhae Hammock RN Entered By: Rhae Hammock on 10/21/2020 11:18:50 -------------------------------------------------------------------------------- Wound Assessment Details Patient Name: Date of Service: [name] 10/21/2020 10:30 A M Medical Record Number: [id number] Patient Account Number:  [id number] Date of Birth/Sex: Treating RN: 09-22-1955 (65 y.o. [name], [name] Primary Care Kaytlan Behrman: [name] Other Clinician: Referring Kyree Adriano: Treating Alessa Mazur/Extender: [name] in Treatment: 3 Wound Status Wound Number: 1 Primary Infection - not elsewhere classified Etiology: Wound Location: Left, Medial Lower Leg Wound Open Wounding Event: Trauma Status: Date Acquired: 08/22/2020 Comorbid Cataracts, Middle ear problems, Asthma, Sleep Apnea, Weeks Of Treatment: 3 History: Hypertension, Type II Diabetes, Osteoarthritis, Confinement Clustered Wound: No Anxiety Photos Wound Measurements Length: (cm) 0.3 Width: (cm) 0.3 Depth: (cm) 0.2 Area: (cm) 0.071 Volume: (cm) 0.014 % Reduction in Area: 69.9% % Reduction in Volume: 85.1% Epithelialization: None Wound Description Classification: Full Thickness Without Exposed Support Structures Wound Margin: Well defined, not attached Exudate Amount: Medium Exudate Type: Sanguinous Exudate Color: red Foul Odor After Cleansing: No Slough/Fibrino Yes Wound Bed Granulation Amount: Large (67-100%) Exposed Structure Granulation Quality: Red Fascia Exposed: No Necrotic Amount: Small (1-33%) Fat Layer (Subcutaneous Tissue) Exposed: Yes Necrotic Quality: Adherent Slough Tendon Exposed: No Muscle Exposed: No Joint Exposed: No Bone Exposed: No Treatment Notes Wound #1 (Lower Leg) Wound Laterality: Left, Medial Cleanser Soap and Water Discharge Instruction: May shower and wash wound with dial antibacterial soap and water prior to dressing change. Wound Cleanser Discharge Instruction: Cleanse the wound with wound cleanser prior to applying a clean dressing using gauze sponges, not tissue or cotton balls. Peri-Wound Care Triamcinolone 15 (g) Discharge Instruction: Use triamcinolone 15 (g) as directed Sween Lotion (Moisturizing lotion) Discharge Instruction: Apply moisturizing lotion as  directed Topical Primary Dressing Endoform 2x2 in Discharge Instruction: Moisten with saline Secondary Dressing Woven Gauze Sponge, Non-Sterile 4x4 in Discharge Instruction: Apply over primary dressing as directed. ABD Pad, 5x9 Discharge Instruction: Apply over primary dressing as directed. Secured With Compression Wrap ThreePress (3 layer compression wrap) Discharge Instruction: Apply  three layer compression as directed. Compression Stockings Add-Ons Electronic Signature(s) Signed: 10/21/2020 10:48:45 AM By: Sandre Kitty Signed: 10/21/2020 5:15:03 PM By: Rhae Hammock RN Entered By: Sandre Kitty on 10/21/2020 10:42:56 -------------------------------------------------------------------------------- Vitals Details Patient Name: Date of Service: [name]. 10/21/2020 10:30 A M Medical Record Number: [id number] Patient Account Number: [id number] Date of Birth/Sex: Treating RN: 06-11-1955 (65 y.o. [name], [name] Primary Care Maxime Beckner: [name] Other Clinician: Referring Shyquan Stallbaumer: Treating Ina Scrivens/Extender: [name] in Treatment: 3 Vital Signs Time Taken: 10:37 Temperature (F): 98.8 Height (in): 60 Pulse (bpm): 98 Weight (lbs): 174 Respiratory Rate (breaths/min): 17 Body Mass Index (BMI): 34 Blood Pressure (mmHg): 118/80 Reference Range: 80 - 120 mg / dl Electronic Signature(s) Signed: 10/21/2020 10:48:45 AM By: Sandre Kitty Entered By: Sandre Kitty on 10/21/2020 10:38:03

## 2020-10-22 NOTE — Progress Notes (Signed)
[name], [name] ([id number]) Visit Report for 10/21/2020 Debridement Details Patient Name: Date of Service: [name] 10/21/2020 10:30 A M Medical Record Number: [id number] Patient Account Number: [id number] Date of Birth/Sex: Treating RN: January 25, 1956 (65 y.o. [name], [name] Primary Care Provider: [name] Other Clinician: Referring Provider: Treating Provider/Extender: [name] in Treatment: 3 Debridement Performed for Assessment: Wound #1 Left,Medial Lower Leg Performed By: Physician Ricard Dillon., MD Debridement Type: Debridement Level of Consciousness (Pre-procedure): Awake and Alert Pre-procedure Verification/Time Out Yes - 11:24 Taken: Start Time: 11:24 Pain Control: Lidocaine T Area Debrided (L x W): otal 0.3 (cm) x 0.3 (cm) = 0.09 (cm) Tissue and other material debrided: Viable, Non-Viable, Subcutaneous, Skin: Dermis , Skin: Epidermis Level: Skin/Subcutaneous Tissue Debridement Description: Excisional Instrument: Curette Bleeding: Minimum Hemostasis Achieved: Pressure End Time: 11:24 Procedural Pain: 0 Post Procedural Pain: 0 Response to Treatment: Procedure was tolerated well Level of Consciousness (Post- Awake and Alert procedure): Post Debridement Measurements of Total Wound Length: (cm) 0.3 Width: (cm) 0.3 Depth: (cm) 0.2 Volume: (cm) 0.014 Character of Wound/Ulcer Post Debridement: Improved Post Procedure Diagnosis Same as Pre-procedure Electronic Signature(s) Signed: 10/21/2020 5:15:03 PM By: Rhae Hammock RN Signed: 10/22/2020 3:40:47 PM By: Linton Ham MD Entered By: Linton Ham on 10/21/2020 12:26:38 -------------------------------------------------------------------------------- HPI Details Patient Name: Date of Service: Diane Shoulder B. 10/21/2020 10:30 A M Medical Record Number: [id number] Patient Account Number: [id number] Date of Birth/Sex: Treating RN: 08-01-1955 (65 y.o. [name], [name] Primary Care Provider: [name] Other Clinician: Referring Provider: Treating Provider/Extender: [name] in Treatment: 3 History of Present Illness HPI Description: ADMISSION 09/30/2020 This is a previously healthy 65 year old woman who is a type II diabetic but not on any current treatment. On 08/22/2020 she may have stepped on her pet cats tail and the cat violently attacked her biting her on her medial and lateral left lower leg. The cat is declined in the front. The patient was seen on 08/25/2020 by her primary physician diagnosed with cellulitis was given a shot of Rocephin and Augmentin for 7 days. The patient has had multiple visits since then including 09/03/2020 7/25 7/27 and most recently 8/12 mostly by her primary physician but also urgent care. She received a prolonged course of Augmentin, 7-day course of doxycycline and most recently an 8-day course of Nuzyra. In general looking at the pictures in epic and also the pictures on the patient's phone today things have gotten a lot better with intense angry erythema initially on the left medial lower leg with multiple small puncture wounds. Currently she has 1 small open area. There is an erythematous firm area medially. Everything is healed on the left although there is still some subcutaneous firmness to some of these areas. The patient stated that the maximum intensity of the infection she was running fevers in the mid 99 range but no other systemic symptoms. She still has 1 remaining small punched out area on the left medial calf. This still has some depth. There is firmness around this area which I think is probably subcutaneous tissue necrosis and mild erythema but no tenderness or crepitus. 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 She has been on oral diuretics for edema in her leg. She tells me she was reviewed by cardiology a year ago ABI on the  left leg was 0.98 10/07/2020; patient is back into clinic this week. Much better edema control in her left leg. The firm areas which I think were secondary to subcutaneous tissue necrosis all feel a lot better however she still has the small but deeply punched out wound on the left medial lower leg 9/6; the patient has 1 open area roughly 0.3 cm deep on the left lower leg. Much better condition of her skin. We have been using endoform under compression 9/13; unfortunately over the last week the small area on the left lower leg has not really improved at all. Still the same dimensions. Thick rims of skin on the edges not well adhered. We have been using endoform under compression Electronic Signature(s) Signed: 10/22/2020 3:40:47 PM By: Linton Ham MD Entered By: Linton Ham on 10/21/2020 12:27:14 -------------------------------------------------------------------------------- Physical Exam Details Patient Name: Date of Service: [name]. 10/21/2020 10:30 A M Medical Record Number: [id number] Patient Account Number: [id number] Date of Birth/Sex: Treating RN: February 27, 1955 (65 y.o. [name], [name] Primary Care Provider: [name] Other Clinician: Referring Provider: Treating Provider/Extender: [name] in Treatment: 3 Constitutional Sitting or standing Blood Pressure is within target range for patient.. Pulse regular and within target range for patient.Marland Kitchen Respirations regular, non-labored and within target range.. Temperature is normal and within the target range for the patient.Marland Kitchen Appears in no distress. Notes Wound exam; 1 small open area remains on the left medial lower leg. I used a #3 curette to remove nonviable edges of skin. Hemostasis with direct pressure Electronic Signature(s) Signed: 10/22/2020  3:40:47 PM By: Linton Ham MD Entered By: Linton Ham on 10/21/2020 12:28:46 -------------------------------------------------------------------------------- Physician Orders Details Patient Name: Date of Service: [name] 10/21/2020 10:30 A M Medical Record Number: [id number] Patient Account Number: [id number] Date of Birth/Sex: Treating RN: [birth year] (65 y.o. [name], [name] Primary Care Provider: [name] Other Clinician: Referring Provider: Treating Provider/Extender: [name] in Treatment: 3 Verbal / Phone Orders: No Diagnosis Coding Follow-up Appointments ppointment in 1 week. - Dr. Dellia Nims Return A Bathing/ Shower/ Hygiene May shower with protection but do not get wound dressing(s) wet. - May use a cast wrap to protect the wrap. You can get these at [facility], [place], or [place]!!:) Edema Control - Lymphedema / SCD / Other Elevate legs to the level of the heart or above for 30 minutes daily and/or when sitting, a frequency of: Avoid standing for long periods of time. Patient to wear own compression stockings every day. Wound Treatment Wound #1 - Lower Leg Wound Laterality: Left, Medial Cleanser: Soap and Water 1 x Per Week/7 Days Discharge Instructions: May shower and wash wound with dial antibacterial soap and water prior to dressing change. Cleanser: Wound Cleanser (Generic) 1 x Per Week/7 Days Discharge Instructions: Cleanse the wound with wound cleanser prior to applying a clean dressing using gauze sponges, not tissue or cotton balls. Peri-Wound Care: Triamcinolone 15 (g) 1 x Per Week/7 Days Discharge Instructions: Use triamcinolone 15 (g) as directed Peri-Wound Care: Sween Lotion (Moisturizing lotion) 1 x Per Week/7 Days Discharge Instructions: Apply moisturizing lotion as directed Prim Dressing: Endoform 2x2 in 1 x Per Week/7 Days ary Discharge Instructions: Moisten with saline Secondary Dressing: Woven Gauze Sponge,  Non-Sterile 4x4 in (Generic) 1 x Per Week/7 Days Discharge Instructions: Apply over primary dressing as directed. Secondary Dressing: ABD Pad, 5x9 (Generic) 1 x Per Week/7 Days Discharge Instructions: Apply over primary dressing as directed. Compression Wrap: ThreePress (3  layer compression wrap) 1 x Per Week/7 Days Discharge Instructions: Apply three layer compression as directed. Electronic Signature(s) Signed: 10/21/2020 5:15:03 PM By: Rhae Hammock RN Signed: 10/22/2020 3:40:47 PM By: Linton Ham MD Entered By: Rhae Hammock on 10/21/2020 11:19:18 -------------------------------------------------------------------------------- Problem List Details Patient Name: Date of Service: [name] 10/21/2020 10:30 A M Medical Record Number: [id number] Patient Account Number: [id number] Date of Birth/Sex: Treating RN: August 09, 1955 (65 y.o. 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 10/21/2020 10:30 A M Medical Record Number: [id number] Patient Account Number: [id number] Date of Birth/Sex: Treating RN: January 29, 1956 (65 y.o. [name], [name] Primary Care Provider: [name] Other  Clinician: Referring Provider: Treating Provider/Extender: [name] in Treatment: 3 Subjective History of Present Illness (HPI) ADMISSION 09/30/2020 This is a previously healthy 65 year old woman who is a type II diabetic but not on any current treatment. On 08/22/2020 she may have stepped on her pet cats tail and the cat violently attacked her biting her on her medial and lateral left lower leg. The cat is declined in the front. The patient was seen on 08/25/2020 by her primary physician diagnosed with cellulitis was given a shot of Rocephin and Augmentin for 7 days. The patient has had multiple visits since then including 09/03/2020 7/25 7/27 and most recently 8/12 mostly by her primary physician but also urgent care. She received a prolonged course of Augmentin, 7-day course of doxycycline and most recently an 8-day course of Nuzyra. In general looking at the pictures in epic and also the pictures on the patient's phone today things have gotten a lot better with intense angry erythema initially on the left medial lower leg with multiple small puncture wounds. Currently she has 1 small open area. There is an erythematous firm area medially. Everything is healed on the left although there is still some subcutaneous firmness to some of these areas. The patient stated that the maximum intensity of the infection she was running fevers in the mid 99 range but no other systemic symptoms. She still has 1 remaining small punched out area on the left medial calf. This still has some depth. There is firmness around this area which I think is probably subcutaneous tissue necrosis and mild erythema but no tenderness or crepitus. 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 She has been on oral diuretics for edema in  her leg. She tells me she was reviewed by cardiology a year ago ABI on the left leg was 0.98 10/07/2020; patient is back into clinic this week. Much better edema control in her left leg. The firm areas which I think were secondary to subcutaneous tissue necrosis all feel a lot better however she still has the small but deeply punched out wound on the left medial lower  leg 9/6; the patient has 1 open area roughly 0.3 cm deep on the left lower leg. Much better condition of her skin. We have been using endoform under compression 9/13; unfortunately over the last week the small area on the left lower leg has not really improved at all. Still the same dimensions. Thick rims of skin on the edges not well adhered. We have been using endoform under compression Objective Constitutional Sitting or standing Blood Pressure is within target range for patient.. Pulse regular and within target range for patient.Marland Kitchen Respirations regular, non-labored and within target range.. Temperature is normal and within the target range for the patient.Marland Kitchen Appears in no distress. Vitals Time Taken: 10:37 AM, Height: 60 in, Weight: 174 lbs, BMI: 34, Temperature: 98.8 F, Pulse: 98 bpm, Respiratory Rate: 17 breaths/min, Blood Pressure: 118/80 mmHg. General Notes: Wound exam; 1 small open area remains on the left medial lower leg. I used a #3 curette to remove nonviable edges of skin. Hemostasis with direct pressure Integumentary (Hair, Skin) Wound #1 status is Open. Original cause of wound was Trauma. The date acquired was: 08/22/2020. The wound has been in treatment 3 weeks. The wound is located on the Left,Medial Lower Leg. The wound measures 0.3cm length x 0.3cm width x 0.2cm depth; 0.071cm^2 area and 0.014cm^3 volume. There is Fat Layer (Subcutaneous Tissue) exposed. There is a medium amount of sanguinous drainage noted. The wound margin is well defined and not attached to the wound base. There is large (67-100%) red  granulation within the wound bed. There is a small (1-33%) amount of necrotic tissue within the wound bed including Adherent Slough. Assessment Active Problems ICD-10 Non-pressure chronic ulcer of left calf with other specified severity Lymphedema, not elsewhere classified Cat-scratch disease Type 2 diabetes mellitus with other skin ulcer Procedures Wound #1 Pre-procedure diagnosis of Wound #1 is an Infection - not elsewhere classified located on the Left,Medial Lower Leg . There was a Excisional Skin/Subcutaneous Tissue Debridement with a total area of 0.09 sq cm performed by Ricard Dillon., MD. With the following instrument(s): Curette to remove Viable and Non-Viable tissue/material. Material removed includes Subcutaneous Tissue, Skin: Dermis, and Skin: Epidermis after achieving pain control using Lidocaine. No specimens were taken. A time out was conducted at 11:24, prior to the start of the procedure. A Minimum amount of bleeding was controlled with Pressure. The procedure was tolerated well with a pain level of 0 throughout and a pain level of 0 following the procedure. Post Debridement Measurements: 0.3cm length x 0.3cm width x 0.2cm depth; 0.014cm^3 volume. Character of Wound/Ulcer Post Debridement is improved. Post procedure Diagnosis Wound #1: Same as Pre-Procedure Pre-procedure diagnosis of Wound #1 is an Infection - not elsewhere classified located on the Left,Medial Lower Leg . There was a Three Layer Compression Therapy Procedure by Rhae Hammock, RN. Post procedure Diagnosis Wound #1: Same as Pre-Procedure Plan Follow-up Appointments: Return Appointment in 1 week. - Dr. Dellia Nims Bathing/ Shower/ Hygiene: May shower with protection but do not get wound dressing(s) wet. - May use a cast wrap to protect the wrap. You can get these at [facility], [place], or [place]!!:) Edema Control - Lymphedema / SCD / Other: Elevate legs to the level of the heart or above for 30 minutes  daily and/or when sitting, a frequency of: Avoid standing for long periods of time. Patient to wear own compression stockings every day. WOUND #1: - Lower Leg Wound Laterality: Left, Medial Cleanser: Soap and Water 1 x Per Week/7 Days Discharge Instructions:  May shower and wash wound with dial antibacterial soap and water prior to dressing change. Cleanser: Wound Cleanser (Generic) 1 x Per Week/7 Days Discharge Instructions: Cleanse the wound with wound cleanser prior to applying a clean dressing using gauze sponges, not tissue or cotton balls. Peri-Wound Care: Triamcinolone 15 (g) 1 x Per Week/7 Days Discharge Instructions: Use triamcinolone 15 (g) as directed Peri-Wound Care: Sween Lotion (Moisturizing lotion) 1 x Per Week/7 Days Discharge Instructions: Apply moisturizing lotion as directed Prim Dressing: Endoform 2x2 in 1 x Per Week/7 Days ary Discharge Instructions: Moisten with saline Secondary Dressing: Woven Gauze Sponge, Non-Sterile 4x4 in (Generic) 1 x Per Week/7 Days Discharge Instructions: Apply over primary dressing as directed. Secondary Dressing: ABD Pad, 5x9 (Generic) 1 x Per Week/7 Days Discharge Instructions: Apply over primary dressing as directed. Com pression Wrap: ThreePress (3 layer compression wrap) 1 x Per Week/7 Days Discharge Instructions: Apply three layer compression as directed. 1. After debridement I am hopeful to be able to get some additional response from endoform still under compression 2. I did not see anything else that dictated culturing needed to be done Electronic Signature(s) Signed: 10/22/2020 3:40:47 PM By: Linton Ham MD Entered By: Linton Ham on 10/21/2020 12:32:55 -------------------------------------------------------------------------------- SuperBill Details Patient Name: Date of Service: [name] 10/21/2020 Medical Record Number: [id number] Patient Account Number: [id number] Date of Birth/Sex: Treating RN: 01/27/1956  (65 y.o. 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 E11.622 Type 2 diabetes mellitus with other skin ulcer Facility Procedures CPT4 Code: [id number] Description: [id number] - DEB SUBQ TISSUE 20 SQ CM/< ICD-10 Diagnosis Description L97.228 Non-pressure chronic ulcer of left calf with other specified severity Modifier: Quantity: 1 Physician Procedures : CPT4 Code Description Modifier [id number] - WC PHYS SUBQ TISS 20 SQ CM ICD-10 Diagnosis Description L97.228 Non-pressure chronic ulcer of left calf with other specified severity Quantity: 1 Electronic Signature(s) Signed: 10/22/2020 3:40:47 PM By: Linton Ham MD Entered By: Linton Ham on 10/21/2020 12:33:06

## 2020-10-28 ENCOUNTER — Encounter (HOSPITAL_BASED_OUTPATIENT_CLINIC_OR_DEPARTMENT_OTHER): Payer: Medicare HMO | Admitting: Internal Medicine

## 2020-10-28 ENCOUNTER — Other Ambulatory Visit: Payer: Self-pay

## 2020-10-28 DIAGNOSIS — L97822 Non-pressure chronic ulcer of other part of left lower leg with fat layer exposed: Secondary | ICD-10-CM | POA: Diagnosis not present

## 2020-10-28 DIAGNOSIS — E11622 Type 2 diabetes mellitus with other skin ulcer: Secondary | ICD-10-CM | POA: Diagnosis not present

## 2020-10-28 DIAGNOSIS — L97228 Non-pressure chronic ulcer of left calf with other specified severity: Secondary | ICD-10-CM | POA: Diagnosis not present

## 2020-10-28 DIAGNOSIS — A281 Cat-scratch disease: Secondary | ICD-10-CM | POA: Diagnosis not present

## 2020-10-28 DIAGNOSIS — I890 Lymphedema, not elsewhere classified: Secondary | ICD-10-CM | POA: Diagnosis not present

## 2020-10-28 DIAGNOSIS — B957 Other staphylococcus as the cause of diseases classified elsewhere: Secondary | ICD-10-CM | POA: Diagnosis not present

## 2020-10-28 DIAGNOSIS — R609 Edema, unspecified: Secondary | ICD-10-CM | POA: Diagnosis not present

## 2020-10-28 DIAGNOSIS — Z1629 Resistance to other single specified antibiotic: Secondary | ICD-10-CM | POA: Diagnosis not present

## 2020-10-28 DIAGNOSIS — Z1611 Resistance to penicillins: Secondary | ICD-10-CM | POA: Diagnosis not present

## 2020-10-28 DIAGNOSIS — Z96652 Presence of left artificial knee joint: Secondary | ICD-10-CM | POA: Diagnosis not present

## 2020-10-28 NOTE — Progress Notes (Signed)
[name] ([id number]) Visit Report for 10/28/2020 Arrival Information Details Patient Name: Date of Service: [name], [name] 10/28/2020 12:45 PM Medical Record Number: [id number] Patient Account Number: [id number] Date of Birth/Sex: Treating RN: 03/26/1955 (65 y.o. [name] Primary Care Renita Brocks: [name] Other Clinician: Referring Tira Lafferty: Treating Jhade Berko/Extender: [name] in Treatment: 4 Visit Information History Since Last Visit Added or deleted any medications: No Patient Arrived: Ambulatory Any new allergies or adverse reactions: No Arrival Time: 12:46 Had a fall or experienced change in No Accompanied By: alone activities of daily living that may affect Transfer Assistance: None risk of falls: Patient Identification Verified: Yes Signs or symptoms of abuse/neglect since last visito No Secondary Verification Process Completed: Yes Hospitalized since last visit: No Patient Requires Transmission-Based Precautions: No Implantable device outside of the clinic excluding No Patient Has Alerts: Yes cellular tissue based products placed in the center Patient Alerts: *No Lidocaine* since last visit: Has Dressing in Place as Prescribed: Yes Has Compression in Place as Prescribed: Yes Pain Present Now: No Electronic Signature(s) Signed: 10/28/2020 5:42:07 PM By: Levan Hurst RN, BSN Entered By: Levan Hurst on 10/28/2020 12:57:51 -------------------------------------------------------------------------------- Clinic Level of Care Assessment Details Patient Name: Date of Service: [name], [name] 10/28/2020 12:45 PM Medical Record Number: [id number] Patient Account Number: [id number] Date of Birth/Sex: Treating RN: 1955-05-19 (65 y.o. [name] Primary Care [name]: [name] Other Clinician: Referring [name]: Treating [name]/Extender: [name] in Treatment: 4 Clinic Level of  Care Assessment Items TOOL 4 Quantity Score X- 1 0 Use when only an EandM is performed on FOLLOW-UP visit ASSESSMENTS - Nursing Assessment / Reassessment X- 1 10 Reassessment of Co-morbidities (includes updates in patient status) X- 1 5 Reassessment of Adherence to Treatment Plan ASSESSMENTS - Wound and Skin A ssessment / Reassessment X - Simple Wound Assessment / Reassessment - one wound 1 5 []  - 0 Complex Wound Assessment / Reassessment - multiple wounds []  - 0 Dermatologic / Skin Assessment (not related to wound area) ASSESSMENTS - Focused Assessment []  - 0 Circumferential Edema Measurements - multi extremities []  - 0 Nutritional Assessment / Counseling / Intervention X- 1 5 Lower Extremity Assessment (monofilament, tuning fork, pulses) []  - 0 Peripheral Arterial Disease Assessment (using hand held doppler) ASSESSMENTS - Ostomy and/or Continence Assessment and Care []  - 0 Incontinence Assessment and Management []  - 0 Ostomy Care Assessment and Management (repouching, etc.) PROCESS - Coordination of Care X - Simple Patient / Family Education for ongoing care 1 15 []  - 0 Complex (extensive) Patient / Family Education for ongoing care X- 1 10 Staff obtains Programmer, systems, Records, T Results / Process Orders est []  - 0 Staff telephones HHA, Nursing Homes / Clarify orders / etc []  - 0 Routine Transfer to another Facility (non-emergent condition) []  - 0 Routine Hospital Admission (non-emergent condition) []  - 0 New Admissions / Biomedical engineer / Ordering NPWT Apligraf, etc. , []  - 0 Emergency Hospital Admission (emergent condition) X- 1 10 Simple Discharge Coordination []  - 0 Complex (extensive) Discharge Coordination PROCESS - Special Needs []  - 0 Pediatric / Minor Patient Management []  - 0 Isolation Patient Management []  - 0 Hearing / Language / Visual special needs []  - 0 Assessment of Community assistance (transportation, D/C planning, etc.) []  -  0 Additional assistance / Altered mentation []  - 0 Support Surface(s) Assessment (bed, cushion, seat, etc.) INTERVENTIONS - Wound Cleansing / Measurement X - Simple Wound Cleansing - one wound  1 5 $R'[]'Xe$  - 0 Complex Wound Cleansing - multiple wounds X- 1 5 Wound Imaging (photographs - any number of wounds) $RemoveBe'[]'suykXFwUH$  - 0 Wound Tracing (instead of photographs) X- 1 5 Simple Wound Measurement - one wound $RemoveB'[]'GDAMfgTm$  - 0 Complex Wound Measurement - multiple wounds INTERVENTIONS - Wound Dressings X - Small Wound Dressing one or multiple wounds 1 10 $Re'[]'JVg$  - 0 Medium Wound Dressing one or multiple wounds $RemoveBeforeD'[]'CsnMOGdaEolNol$  - 0 Large Wound Dressing one or multiple wounds $RemoveBeforeD'[]'aOPcfCsjTuIMCS$  - 0 Application of Medications - topical $RemoveB'[]'lNHNTGKD$  - 0 Application of Medications - injection INTERVENTIONS - Miscellaneous $RemoveBeforeD'[]'rwjoVtBtzIjxfV$  - 0 External ear exam $Remove'[]'dSbrYSn$  - 0 Specimen Collection (cultures, biopsies, blood, body fluids, etc.) $RemoveBefor'[]'TGtsYyAdojbz$  - 0 Specimen(s) / Culture(s) sent or taken to Lab for analysis $RemoveBefo'[]'NBHVMAEzSoi$  - 0 Patient Transfer (multiple staff / Civil Service fast streamer / Similar devices) $RemoveBeforeDE'[]'ebWViKhmYltytyg$  - 0 Simple Staple / Suture removal (25 or less) $Remove'[]'lrPhfPx$  - 0 Complex Staple / Suture removal (26 or more) $Remove'[]'UwdXDOw$  - 0 Hypo / Hyperglycemic Management (close monitor of Blood Glucose) $RemoveBefore'[]'KjjRGtTWFBQfg$  - 0 Ankle / Brachial Index (ABI) - do not check if billed separately X- 1 5 Vital Signs Has the patient been seen at the hospital within the last three years: Yes Total Score: 90 Level Of Care: New/Established - Level 3 Electronic Signature(s) Signed: 10/28/2020 5:42:07 PM By: Levan Hurst RN, BSN Entered By: Levan Hurst on 10/28/2020 16:55:42 -------------------------------------------------------------------------------- Encounter Discharge Information Details Patient Name: Date of Service: [name] 10/28/2020 12:45 PM Medical Record Number: [id number] Patient Account Number: [id number] Date of Birth/Sex: Treating RN: Jul 01, 1955 (65 y.o. [name] Primary Care Udell Blasingame: [name] Other  Clinician: Referring Elaf Clauson: Treating Jonatha Gagen/Extender: [name] in Treatment: 4 Encounter Discharge Information Items Discharge Condition: Stable Ambulatory Status: Ambulatory Discharge Destination: Home Transportation: Private Auto Accompanied By: alone Schedule Follow-up Appointment: Yes Clinical Summary of Care: Patient Declined Electronic Signature(s) Signed: 10/28/2020 5:42:07 PM By: Levan Hurst RN, BSN Entered By: Levan Hurst on 10/28/2020 16:56:22 -------------------------------------------------------------------------------- Lower Extremity Assessment Details Patient Name: Date of Service: [name] 10/28/2020 12:45 PM Medical Record Number: [id number] Patient Account Number: [id number] Date of Birth/Sex: Treating RN: [birth year] (65 y.o. [name] Primary Care Tion Tse: [name] Other Clinician: Referring Shermeka Rutt: Treating Malique Driskill/Extender: [name] in Treatment: 4 Edema Assessment Assessed: Shirlyn Goltz: No] Patrice Paradise: No] Edema: [Left: Ye] [Right: s] Calf Left: Right: Point of Measurement: 25 cm From Medial Instep 36 cm Ankle Left: Right: Point of Measurement: 7 cm From Medial Instep 24.5 cm Vascular Assessment Pulses: Dorsalis Pedis Palpable: [Left:Yes] Electronic Signature(s) Signed: 10/28/2020 5:42:07 PM By: Levan Hurst RN, BSN Entered By: Levan Hurst on 10/28/2020 12:58:19 -------------------------------------------------------------------------------- Multi Wound Chart Details Patient Name: Date of Service: [name] 10/28/2020 12:45 PM Medical Record Number: [id number] Patient Account Number: [id number] Date of Birth/Sex: Treating RN: 02/12/1955 (65 y.o. [name], [name] Primary Care Neithan Day: [name] Other Clinician: Referring Yafet Cline: Treating Rosser Collington/Extender: [name] in Treatment: 4 Vital Signs Height(in):  60 Pulse(bpm): 62 Weight(lbs): 174 Blood Pressure(mmHg): 123/81 Body Mass Index(BMI): 34 Temperature(F): 98.7 Respiratory Rate(breaths/min): 16 Photos: [N/A:N/A] Left, Medial Lower Leg N/A N/A Wound Location: Trauma N/A N/A Wounding Event: Infection - not elsewhere classified N/A N/A Primary Etiology: Cataracts, Middle ear problems, N/A N/A Comorbid History: Asthma, Sleep Apnea, Hypertension, Type II Diabetes, Osteoarthritis, Confinement Anxiety 08/22/2020 N/A N/A Date Acquired: 4 N/A N/A Weeks of Treatment: Open N/A N/A Wound Status: 0.9x1.2x0.2 N/A N/A Measurements  L x W x D (cm) 0.848 N/A N/A A (cm) : rea 0.17 N/A N/A Volume (cm) : -259.30% N/A N/A % Reduction in A rea: -80.90% N/A N/A % Reduction in Volume: 7 Starting Position 1 (o'clock): 9 Ending Position 1 (o'clock): 0.8 Maximum Distance 1 (cm): Yes N/A N/A Undermining: Full Thickness Without Exposed N/A N/A Classification: Support Structures Medium N/A N/A Exudate A mount: Sanguinous N/A N/A Exudate Type: red N/A N/A Exudate Color: Well defined, not attached N/A N/A Wound Margin: Large (67-100%) N/A N/A Granulation Amount: Red, Pink N/A N/A Granulation Quality: Small (1-33%) N/A N/A Necrotic Amount: Eschar, Adherent Slough N/A N/A Necrotic Tissue: Fat Layer (Subcutaneous Tissue): Yes N/A N/A Exposed Structures: Fascia: No Tendon: No Muscle: No Joint: No Bone: No None N/A N/A Epithelialization: Treatment Notes Electronic Signature(s) Signed: 10/28/2020 4:30:53 PM By: Linton Ham MD Signed: 10/28/2020 6:09:57 PM By: Rhae Hammock RN Entered By: Linton Ham on 10/28/2020 13:10:13 -------------------------------------------------------------------------------- Multi-Disciplinary Care Plan Details Patient Name: Date of Service: [name], [name] 10/28/2020 12:45 PM Medical Record Number: [id number] Patient Account Number: [id number] Date of Birth/Sex: Treating  RN: 04/23/1955 (65 y.o. [name] Primary Care Zonia Caplin: [name] Other Clinician: Referring Bridgette Wolden: Treating Liba Hulsey/Extender: [name] in Treatment: 4 Active Inactive Wound/Skin Impairment Nursing Diagnoses: Impaired tissue integrity Knowledge deficit related to ulceration/compromised skin integrity Goals: Patient/caregiver will verbalize understanding of skin care regimen Date Initiated: 09/30/2020 Target Resolution Date: 11/28/2020 Goal Status: Active Interventions: Assess patient/caregiver ability to obtain necessary supplies Assess patient/caregiver ability to perform ulcer/skin care regimen upon admission and as needed Assess ulceration(s) every visit Notes: Electronic Signature(s) Signed: 10/28/2020 5:42:07 PM By: Levan Hurst RN, BSN Entered By: Levan Hurst on 10/28/2020 16:54:57 -------------------------------------------------------------------------------- Pain Assessment Details Patient Name: Date of Service: [name] 10/28/2020 12:45 PM Medical Record Number: [id number] Patient Account Number: [id number] Date of Birth/Sex: Treating RN: [birth year] (65 y.o. [name] Primary Care Shanee Batch: [name] Other Clinician: Referring Carly Applegate: Treating Hetvi Shawhan/Extender: [name] in Treatment: 4 Active Problems Location of Pain Severity and Description of Pain Patient Has Paino No Site Locations Pain Management and Medication Current Pain Management: Electronic Signature(s) Signed: 10/28/2020 5:42:07 PM By: Levan Hurst RN, BSN Entered By: Levan Hurst on 10/28/2020 12:58:11 -------------------------------------------------------------------------------- Patient/Caregiver Education Details Patient Name: Date of Service: [name] 9/20/2022andnbsp12:45 PM Medical Record Number: [id number] Patient Account Number: [id number] Date of Birth/Gender: Treating  RN: [birth year] (64 y.o. [name] Primary Care Physician: [name] Other Clinician: Referring Physician: Treating Physician/Extender: [name] in Treatment: 4 Education Assessment Education Provided To: Patient Education Topics Provided Wound/Skin Impairment: Methods: Explain/Verbal Responses: State content correctly Electronic Signature(s) Signed: 10/28/2020 5:42:07 PM By: Levan Hurst RN, BSN Entered By: Levan Hurst on 10/28/2020 16:55:08 -------------------------------------------------------------------------------- Wound Assessment Details Patient Name: Date of Service: [name] 10/28/2020 12:45 PM Medical Record Number: [id number] Patient Account Number: [id number] Date of Birth/Sex: Treating RN: 12/23/1955 (65 y.o. [name] Primary Care Jamar Casagrande: [name] Other Clinician: Referring Korra Christine: Treating Rhoda Waldvogel/Extender: [name] in Treatment: 4 Wound Status Wound Number: 1 Primary Infection - not elsewhere classified Etiology: Wound Location: Left, Medial Lower Leg Wound Open Wounding Event: Trauma Status: Date Acquired: 08/22/2020 Comorbid Cataracts, Middle ear problems, Asthma, Sleep Apnea, Weeks Of Treatment: 4 History: Hypertension, Type II Diabetes, Osteoarthritis, Confinement Clustered Wound: No Anxiety Photos Wound Measurements Length: (cm) 0.9 Width: (cm) 1.2 Depth: (cm) 0.2 Area: (cm) 0.848 Volume: (  cm) 0.17 % Reduction in Area: -259.3% % Reduction in Volume: -80.9% Epithelialization: None Tunneling: No Undermining: Yes Starting Position (o'clock): 7 Ending Position (o'clock): 9 Maximum Distance: (cm) 0.8 Wound Description Classification: Full Thickness Without Exposed Support Structures Wound Margin: Well defined, not attached Exudate Amount: Medium Exudate Type: Sanguinous Exudate Color: red Foul Odor After Cleansing: No Slough/Fibrino  Yes Wound Bed Granulation Amount: Large (67-100%) Exposed Structure Granulation Quality: Red, Pink Fascia Exposed: No Necrotic Amount: Small (1-33%) Fat Layer (Subcutaneous Tissue) Exposed: Yes Necrotic Quality: Eschar, Adherent Slough Tendon Exposed: No Muscle Exposed: No Joint Exposed: No Bone Exposed: No Treatment Notes Wound #1 (Lower Leg) Wound Laterality: Left, Medial Cleanser Soap and Water Discharge Instruction: May shower and wash wound with dial antibacterial soap and water prior to dressing change. Wound Cleanser Discharge Instruction: Cleanse the wound with wound cleanser prior to applying a clean dressing using gauze sponges, not tissue or cotton balls. [place] Ancillary Kit - 15 Day Supply Discharge Instruction: Use supplies as instructed; Kit contains: (15) Saline Bullets; (15) 3x3 Gauze; 15 pr Gloves Peri-Wound Care Topical Primary Dressing KerraCel Ag Gelling Fiber Dressing, 2x2 in (silver alginate) Discharge Instruction: Apply silver alginate to wound bed as instructed Secondary Dressing Woven Gauze Sponge, Non-Sterile 4x4 in Discharge Instruction: Apply over primary dressing as directed. Bordered Gauze, 4x4 in Discharge Instruction: Apply over primary dressing as directed. Secured With Compression Wrap Compression Stockings Environmental education officer) Signed: 10/28/2020 5:42:07 PM By: Levan Hurst RN, BSN Entered By: Levan Hurst on 10/28/2020 13:03:11 -------------------------------------------------------------------------------- [place] Details Patient Name: Date of Service: [name] 10/28/2020 12:45 PM Medical Record Number: [id number] Patient Account Number: [id number] Date of Birth/Sex: Treating RN: November 16, 1955 (65 y.o. [name] Primary Care Merve Hotard: [name] Other Clinician: Referring Khole Arterburn: Treating Shayne Deerman/Extender: [name] in Treatment: 4 Vital Signs Time Taken:  12:46 Temperature (F): 98.7 Height (in): 60 Pulse (bpm): 88 Weight (lbs): 174 Respiratory Rate (breaths/min): 16 Body Mass Index (BMI): 34 Blood Pressure (mmHg): 123/81 Reference Range: 80 - 120 mg / dl Electronic Signature(s) Signed: 10/28/2020 5:42:07 PM By: Levan Hurst RN, BSN Entered By: Levan Hurst on 10/28/2020 12:58:06

## 2020-10-29 NOTE — Progress Notes (Signed)
[name], [name] ([id number]) Visit Report for 10/28/2020 HPI Details Patient Name: Date of Service: [name], [name] 10/28/2020 12:45 PM Medical Record Number: [id number] Patient Account Number: [id number] Date of Birth/Sex: Treating RN: [birth year] (65 y.o. [name], [name] Primary Care Provider: Scarlette Whitney Other Clinician: Referring Provider: Treating Provider/Extender: [name] in Treatment: 4 History of Present Illness HPI Description: ADMISSION 09/30/2020 This is a previously healthy 65 year old woman who is a type II diabetic but not on any current treatment. On 08/22/2020 she may have stepped on her pet cats tail and the cat violently attacked her biting her on her medial and lateral left lower leg. The cat is declined in the front. The patient was seen on 08/25/2020 by her primary physician diagnosed with cellulitis was given a shot of Rocephin and Augmentin for 7 days. The patient has had multiple visits since then including 09/03/2020 7/25 7/27 and most recently 8/12 mostly by her primary physician but also urgent care. She received a prolonged course of Augmentin, 7-day course of doxycycline and most recently an 8-day course of Nuzyra. In general looking at the pictures in epic and also the pictures on the patient's phone today things have gotten a lot better with intense angry erythema initially on the left medial lower leg with multiple small puncture wounds. Currently she has 1 small open area. There is an erythematous firm area medially. Everything is healed on the left although there is still some subcutaneous firmness to some of these areas. The patient stated that the maximum intensity of the infection she was running fevers in the mid 99 range but no other systemic symptoms. She still has 1 remaining small punched out area on the left medial calf. This still has some depth. There is firmness around this area which I think is  probably subcutaneous tissue necrosis and mild erythema but no tenderness or crepitus. 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 She has been on oral diuretics for edema in her leg. She tells me she was reviewed by cardiology a year ago ABI on the left leg was 0.98 10/07/2020; patient is back into clinic this week. Much better edema control in her left leg. The firm areas which I think were secondary to subcutaneous tissue necrosis all feel a lot better however she still has the small but deeply punched out wound on the left medial lower leg 9/6; the patient has 1 open area roughly 0.3 cm deep on the left lower leg. Much better condition of her skin. We have been using endoform under compression 9/13; unfortunately over the last week the small area on the left lower leg has not really improved at all. Still the same dimensions. Thick rims of skin on the edges not well adhered. We have been using endoform under compression 9/20 comes in today with a fairly marked deterioration. The area is larger and tunnels posteriorly to a second small open area. There is some more significant tenderness reported by the patient around the wound. We had been using endoform and until last week everything looked fairly well Electronic Signature(s) Signed: 10/28/2020 4:30:53 PM By: Linton Ham MD Entered By: Linton Ham on 10/28/2020 13:11:37 -------------------------------------------------------------------------------- Physical Exam Details Patient Name: Date of Service: [name] 10/28/2020 12:45 PM Medical Record Number: [id number] Patient Account Number: [id number] Date of Birth/Sex:  Treating RN: [birth year] (65 y.o. [name], [name] Primary Care Provider: Scarlette Whitney Other Clinician: Referring Provider: Treating  Provider/Extender: [name] in Treatment: 4 Constitutional Sitting or standing Blood Pressure is within target range for patient.. Pulse regular and within target range for patient.Marland Kitchen Respirations regular, non-labored and within target range.. Temperature is normal and within the target range for the patient.Marland Kitchen Appears in no distress. Notes Wound exam; left medial lower leg. The wound itself is deeper and probes posteriorly to a small second open area. No overt infection around this although the patient notes increasing tenderness. Electronic Signature(s) Signed: 10/28/2020 4:30:53 PM By: Linton Ham MD Entered By: Linton Ham on 10/28/2020 13:12:35 -------------------------------------------------------------------------------- Physician Orders Details Patient Name: Date of Service: [name], [name] 10/28/2020 12:45 PM Medical Record Number: [id number] Patient Account Number: [id number] Date of Birth/Sex: Treating RN: 1955-08-22 (65 y.o. 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 - Dr. Dellia Nims Return A Bathing/ Shower/ Hygiene May shower and wash wound with soap and water. Edema Control - Lymphedema / SCD / Other Elevate legs to the level of the heart or above for 30 minutes daily and/or when sitting, a frequency of: - throughout the day Avoid standing for long periods of time. Patient to wear own compression stockings every day. Wound Treatment Wound #1 - Lower Leg Wound Laterality: Left,  Medial Cleanser: Soap and Water Every Other Day/15 Days Discharge Instructions: May shower and wash wound with dial antibacterial soap and water prior to dressing change. Cleanser: Wound Cleanser Every Other Day/15 Days Discharge Instructions: Cleanse the wound with wound cleanser prior to applying a clean dressing using gauze sponges, not tissue or cotton balls. Cleanser: [place] Ancillary Kit - 15 Day Supply (DME) (Generic) Every Other Day/15 Days Discharge Instructions: Use supplies as instructed; Kit contains: (15) Saline Bullets; (15) 3x3 Gauze; 15 pr Gloves Prim Dressing: KerraCel Ag Gelling Fiber Dressing, 2x2 in (silver alginate) (DME) (Generic) Every Other Day/15 Days ary Discharge Instructions: Apply silver alginate to wound bed as instructed Secondary Dressing: Woven Gauze Sponge, Non-Sterile 4x4 in (DME) (Generic) Every Other Day/15 Days Discharge Instructions: Apply over primary dressing as directed. Secondary Dressing: Bordered Gauze, 4x4 in (DME) (Generic) Every Other Day/15 Days Discharge Instructions: Apply over primary dressing as directed. Laboratory naerobe culture (MICRO) - left medial lower leg - (ICD10 L97.228 - Non-pressure chronic ulcer of Bacteria identified in Unspecified specimen by A left calf with other specified severity) LOINC Code: 544-9 Convenience Name: Anerobic culture Electronic Signature(s) Signed: 10/28/2020 5:42:07 PM By: Levan Hurst RN, BSN Signed: 10/29/2020 4:56:16 PM By: Linton Ham MD Previous Signature: 10/28/2020 4:30:53 PM Version By: Linton Ham MD Entered By: Levan Hurst on 10/28/2020 16:30:16 -------------------------------------------------------------------------------- Problem List Details Patient Name: Date of Service: [name], [name] 10/28/2020 12:45 PM Medical Record Number: [id number] Patient Account Number: [id number] Date of Birth/Sex: Treating RN: [birth year] (65 y.o. 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 [name], [name] Primary Care Provider: Scarlette Whitney Other Clinician: Referring Provider: Treating Provider/Extender: [name] in Treatment: 4 Subjective History of Present Illness (HPI) ADMISSION 09/30/2020 This is a previously healthy 65 year old woman who is a type II diabetic but not on any current treatment. On 08/22/2020 she may have stepped on her pet cats tail and the cat violently attacked her biting her on her medial and lateral left lower leg. The cat is declined in the front. The patient was seen on 08/25/2020 by her primary physician diagnosed with cellulitis was given a shot of Rocephin and Augmentin for 7 days. The patient has had multiple visits since then including 09/03/2020 7/25 7/27 and most recently 8/12 mostly by her primary physician but also urgent care. She received a prolonged course of Augmentin, 7-day course of doxycycline and most recently an 8-day course of Nuzyra. In general looking at the pictures in epic and  also the pictures on the patient's phone today things have gotten a lot better with intense angry erythema initially on the left medial lower leg with multiple small puncture wounds. Currently she has 1 small open area. There is an erythematous firm area medially. Everything is healed on the left although there is still some subcutaneous firmness to some of these areas. The patient stated that the maximum intensity of the infection she was running fevers in the mid 99 range but no other systemic symptoms. She still has 1 remaining small punched out area on the left medial calf. This still has some depth. There is firmness around this area which I think is probably subcutaneous tissue necrosis and mild erythema but no tenderness or crepitus. 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 She has been on oral diuretics for edema in her leg. She tells me she was reviewed by cardiology a year ago ABI on the left leg was 0.98 10/07/2020; patient is back into clinic this week. Much better edema control in her left leg. The firm areas which I think were secondary to subcutaneous tissue necrosis all feel a lot better however she still has the small but deeply punched out wound on the left medial lower leg 9/6; the patient has 1 open area roughly 0.3 cm deep on the left lower leg. Much better condition of her skin. We have been using endoform under compression 9/13; unfortunately over the last week the small area on the left lower leg has not really improved at all. Still the same dimensions. Thick rims of skin on the edges not well adhered. We have been using endoform under compression 9/20 comes in today with a fairly marked deterioration. The area is larger and tunnels posteriorly to a second small open area. There is some more  significant tenderness reported by the patient around the wound. We had been using endoform and until last week everything looked fairly well Objective  Constitutional Sitting or standing Blood Pressure is within target range for patient.. Pulse regular and within target range for patient.Marland Kitchen Respirations regular, non-labored and within target range.. Temperature is normal and within the target range for the patient.Marland Kitchen Appears in no distress. Vitals Time Taken: 12:46 PM, Height: 60 in, Weight: 174 lbs, BMI: 34, Temperature: 98.7 F, Pulse: 88 bpm, Respiratory Rate: 16 breaths/min, Blood Pressure: 123/81 mmHg. General Notes: Wound exam; left medial lower leg. The wound itself is deeper and probes posteriorly to a small second open area. No overt infection around this although the patient notes increasing tenderness. Integumentary (Hair, Skin) Wound #1 status is Open. Original cause of wound was Trauma. The date acquired was: 08/22/2020. The wound has been in treatment 4 weeks. The wound is located on the Left,Medial Lower Leg. The wound measures 0.9cm length x 1.2cm width x 0.2cm depth; 0.848cm^2 area and 0.17cm^3 volume. There is Fat Layer (Subcutaneous Tissue) exposed. There is no tunneling noted, however, there is undermining starting at 7:00 and ending at 9:00 with a maximum distance of 0.8cm. There is a medium amount of sanguinous drainage noted. The wound margin is well defined and not attached to the wound base. There is large (67- 100%) red, pink granulation within the wound bed. There is a small (1-33%) amount of necrotic tissue within the wound bed including Eschar and Adherent Slough. Assessment Active Problems ICD-10 Non-pressure chronic ulcer of left calf with other specified severity Lymphedema, not elsewhere classified Cat-scratch disease Type 2 diabetes mellitus with other skin ulcer Plan Follow-up Appointments: Return Appointment in 1 week. - Dr. Dellia Nims Bathing/ Shower/  Hygiene: May shower and wash wound with soap and water. Edema Control - Lymphedema / SCD / Other: Elevate legs to the level of the heart or above for 30 minutes daily and/or when sitting, a frequency of: - throughout the day Avoid standing for long periods of time. Patient to wear own compression stockings every day. WOUND #1: - Lower Leg Wound Laterality: Left, Medial Cleanser: Soap and Water Every Other Day/15 Days Discharge Instructions: May shower and wash wound with dial antibacterial soap and water prior to dressing change. Cleanser: Wound Cleanser (Generic) Every Other Day/15 Days Discharge Instructions: Cleanse the wound with wound cleanser prior to applying a clean dressing using gauze sponges, not tissue or cotton balls. Prim Dressing: KerraCel Ag Gelling Fiber Dressing, 2x2 in (silver alginate) Every Other Day/15 Days ary Discharge Instructions: Apply silver alginate to wound bed as instructed Secondary Dressing: Bordered Gauze, 4x4 in Every Other Day/15 Days Discharge Instructions: Apply over primary dressing as directed. 1. The exact reason for the deterioration this week is unclear 2. PCR culture done but no empiric antibiotics until this returns which should be Thursday morning at which time we will address further systemic antibiotics 3. I am going to allow her to change the dressing herself rather than putting her in compression Electronic Signature(s) Signed: 10/28/2020 4:30:53 PM By: Linton Ham MD Entered By: Linton Ham on 10/28/2020 13:14:38 -------------------------------------------------------------------------------- SuperBill Details Patient Name: Date of Service: [name] 10/28/2020 Medical Record Number: [id number] Patient Account Number: [id number] Date of Birth/Sex: Treating RN: 04-27-1955 (65 y.o. 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 E11.622 Type 2 diabetes mellitus with other skin ulcer Facility  Procedures CPT4 Code: [id number] Description: 52778 - WOUND CARE VISIT-LEV 3 EST PT Modifier: Quantity: 1 Physician Procedures : CPT4 Code Description Modifier [id number] - WC PHYS LEVEL 3 - EST PT ICD-10 Diagnosis Description L97.228 Non-pressure chronic ulcer of left calf with other specified severity I89.0 Lymphedema, not elsewhere classified Quantity: 1 Electronic Signature(s) Signed: 10/28/2020 5:42:07 PM By: Levan Hurst RN, BSN Signed: 10/29/2020 4:56:16 PM By: Linton Ham MD Previous Signature: 10/28/2020 4:30:53 PM Version By: Linton Ham MD Entered By: Levan Hurst on 10/28/2020 16:55:49

## 2020-10-30 DIAGNOSIS — E11622 Type 2 diabetes mellitus with other skin ulcer: Secondary | ICD-10-CM | POA: Diagnosis not present

## 2020-10-30 DIAGNOSIS — S81802A Unspecified open wound, left lower leg, initial encounter: Secondary | ICD-10-CM | POA: Diagnosis not present

## 2020-11-04 ENCOUNTER — Encounter (HOSPITAL_BASED_OUTPATIENT_CLINIC_OR_DEPARTMENT_OTHER): Payer: Medicare HMO | Admitting: Internal Medicine

## 2020-11-04 ENCOUNTER — Other Ambulatory Visit: Payer: Self-pay

## 2020-11-04 DIAGNOSIS — E11622 Type 2 diabetes mellitus with other skin ulcer: Secondary | ICD-10-CM | POA: Diagnosis not present

## 2020-11-04 DIAGNOSIS — Z96652 Presence of left artificial knee joint: Secondary | ICD-10-CM | POA: Diagnosis not present

## 2020-11-04 DIAGNOSIS — I890 Lymphedema, not elsewhere classified: Secondary | ICD-10-CM | POA: Diagnosis not present

## 2020-11-04 DIAGNOSIS — L0889 Other specified local infections of the skin and subcutaneous tissue: Secondary | ICD-10-CM | POA: Diagnosis not present

## 2020-11-04 DIAGNOSIS — L97822 Non-pressure chronic ulcer of other part of left lower leg with fat layer exposed: Secondary | ICD-10-CM | POA: Diagnosis not present

## 2020-11-04 DIAGNOSIS — L97228 Non-pressure chronic ulcer of left calf with other specified severity: Secondary | ICD-10-CM | POA: Diagnosis not present

## 2020-11-04 DIAGNOSIS — R609 Edema, unspecified: Secondary | ICD-10-CM | POA: Diagnosis not present

## 2020-11-04 DIAGNOSIS — A281 Cat-scratch disease: Secondary | ICD-10-CM | POA: Diagnosis not present

## 2020-11-06 NOTE — Progress Notes (Signed)
[name], [name] ([id number]) Visit Report for 11/04/2020 HPI Details Patient Name: Date of Service: [name], [name] 11/04/2020 1:30 PM Medical Record Number: [id number] Patient Account Number: [id number] Date of Birth/Sex: Treating RN: 1955/11/01 (65 y.o. [name], [name] Primary Care Provider: Scarlette Whitney Other Clinician: Referring Provider: Treating Provider/Extender: [name] in Treatment: 5 History of Present Illness HPI Description: ADMISSION 09/30/2020 This is a previously healthy 65 year old woman who is a type II diabetic but not on any current treatment. On 08/22/2020 she may have stepped on her pet cats tail and the cat violently attacked her biting her on her medial and lateral left lower leg. The cat is declined in the front. The patient was seen on 08/25/2020 by her primary physician diagnosed with cellulitis was given a shot of Rocephin and Augmentin for 7 days. The patient has had multiple visits since then including 09/03/2020 7/25 7/27 and most recently 8/12 mostly by her primary physician but also urgent care. She received a prolonged course of Augmentin, 7-day course of doxycycline and most recently an 8-day course of Nuzyra. In general looking at the pictures in epic and also the pictures on the patient's phone today things have gotten a lot better with intense angry erythema initially on the left medial lower leg with multiple small puncture wounds. Currently she has 1 small open area. There is an erythematous firm area medially. Everything is healed on the left although there is still some subcutaneous firmness to some of these areas. The patient stated that the maximum intensity of the infection she was running fevers in the mid 99 range but no other systemic symptoms. She still has 1 remaining small punched out area on the left medial calf. This still has some depth. There is firmness around this area which I think is probably subcutaneous  tissue necrosis and mild erythema but no tenderness or crepitus. 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 She has been on oral diuretics for edema in her leg. She tells me she was reviewed by cardiology a year ago ABI on the left leg was 0.98 10/07/2020; patient is back into clinic this week. Much better edema control in her left leg. The firm areas which I think were secondary to subcutaneous tissue necrosis all feel a lot better however she still has the small but deeply punched out wound on the left medial lower leg 9/6; the patient has 1 open area roughly 0.3 cm deep on the left lower leg. Much better condition of her skin. We have been using endoform under compression 9/13; unfortunately over the last week the small area on the left lower leg has not really improved at all. Still the same dimensions. Thick rims of skin on the edges not well adhered. We have been using endoform under compression 9/20 comes in today with a fairly marked deterioration. The area is larger and tunnels posteriorly to a second small open area. There is some more significant tenderness reported by the patient around the wound. We had been using endoform and until last week everything looked fairly well 9/27; PCR culture from last week grew Staph epidermidis. I gave her a course of Elesa Hacker which she is actually taking currently. Complains of nausea. We have been using silver alginate under compression. Electronic Signature(s) Signed: 11/04/2020 4:21:38 PM By: Diane Ham MD Entered  By: [name] on 11/04/2020 14:36:08 -------------------------------------------------------------------------------- Physical Exam Details Patient Name: Date of Service: [name] 11/04/2020 1:30 PM Medical Record Number: [id number] Patient Account  Number: [id number] Date of Birth/Sex: Treating RN: [birth year] (65 y.o. [name], [name] Primary Care Provider: Scarlette Whitney Other Clinician: Referring Provider: Treating Provider/Extender: [name] in Treatment: 5 Constitutional Sitting or standing Blood Pressure is within target range for patient.. Pulse regular and within target range for patient.Marland Kitchen Respirations regular, non-labored and within target range.. Temperature is normal and within the target range for the patient.Marland Kitchen Appears in no distress. Notes Wound exam; left medial lower leg. The wound itself now looks like a small circular area. The second connecting area has closed over and there is minimal undermining. The base of the wound looks fairly healthy there is no surrounding erythema. No palpable subcutaneous involvement Electronic Signature(s) Signed: 11/04/2020 4:21:38 PM By: Diane Ham MD Entered By: [name] on 11/04/2020 14:36:57 -------------------------------------------------------------------------------- Physician Orders Details Patient Name: Date of Service: [name] 11/04/2020 1:30 PM Medical Record Number: [id number] Patient Account Number: [id number] Date of Birth/Sex: Treating RN: [birth year] (65 y.o. 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 - [name] Return A Bathing/ Shower/ Hygiene May shower and wash wound with soap and water. Edema Control - Lymphedema / SCD / Other Elevate legs to the level of the  heart or above for 30 minutes daily and/or when sitting, a frequency of: - throughout the day Avoid standing for long periods of time. Patient to wear own compression stockings every day. Wound Treatment Wound #1 - Lower Leg Wound Laterality: Left, Medial Cleanser: Soap and Water 1 x Per Week/7 Days Discharge Instructions: May shower and wash wound with dial antibacterial soap and water prior to dressing change. Cleanser: Wound Cleanser 1 x Per Week/7 Days Discharge Instructions: Cleanse the wound with wound cleanser prior to applying a clean dressing using gauze sponges, not tissue or cotton balls. Peri-Wound Care: Sween Lotion (Moisturizing lotion) 1 x Per Week/7 Days Discharge Instructions: Apply moisturizing lotion as directed Prim Dressing: Promogran Prisma Matrix, 4.34 (sq in) (silver collagen) 1 x Per Week/7 Days ary Discharge Instructions: Moisten collagen with saline or hydrogel Secondary Dressing: Woven Gauze Sponge, Non-Sterile 4x4 in (Generic) 1 x Per Week/7 Days Discharge Instructions: Apply over primary dressing as directed. Compression Wrap: ThreePress (3 layer compression wrap) 1 x Per Week/7 Days Discharge Instructions: Apply three layer compression as directed. Electronic Signature(s) Signed: 11/04/2020 4:21:38 PM By: Diane Ham MD Signed: 11/06/2020 4:49:13 PM By: Levan Hurst RN, BSN Entered By: Levan Hurst on 11/04/2020 14:57:50 -------------------------------------------------------------------------------- Problem List Details Patient Name: Date of Service: [name] 11/04/2020 1:30 PM Medical Record Number: [id number] Patient Account Number: [id number] Date of Birth/Sex: Treating RN: 09/17/1955 (65 y.o. 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 [name], [name] Primary Care Provider: Scarlette Whitney Other Clinician: Referring Provider: Treating Provider/Extender: [name] in Treatment: 5 Subjective History of Present Illness (HPI) ADMISSION 09/30/2020 This is a previously healthy 65 year old woman who is a type II diabetic but not on any current treatment. On 08/22/2020 she may have stepped on her pet cats tail and the cat violently attacked her biting her on her medial and lateral left lower leg. The cat is declined in the front. The patient was seen on 08/25/2020 by her primary physician diagnosed with cellulitis was given a shot of Rocephin and Augmentin for 7 days. The patient has had multiple visits since then including 09/03/2020 7/25 7/27 and most recently 8/12 mostly by her primary physician but also urgent care. She received a prolonged course of Augmentin, 7-day course of doxycycline and most recently an 8-day course of Nuzyra. In general looking at the pictures in epic and also the pictures on the patient's phone today things have gotten a lot better with intense angry erythema initially on the left medial lower leg with multiple small puncture wounds. Currently she  has 1 small open area. There is an erythematous firm area medially. Everything is healed on the left although there is still some subcutaneous firmness to some of these areas. The patient stated that the maximum intensity of the infection she was running fevers in the mid 99 range but no other systemic symptoms. She still has 1 remaining small punched out area on the left medial calf. This still has some depth. There is firmness around this area which I think is probably subcutaneous tissue necrosis and mild erythema but no tenderness or crepitus. 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 She has been on oral diuretics for edema in her leg. She tells me she was reviewed by cardiology a year ago ABI on the left leg was 0.98 10/07/2020; patient is back into clinic this week. Much better edema control in her left leg. The firm areas which I think were secondary to subcutaneous tissue necrosis all feel a lot better however she still has the small but deeply punched out wound on the left medial lower leg 9/6; the patient has 1 open area roughly 0.3 cm deep on the left lower leg. Much better condition of her skin. We have been using endoform under compression 9/13; unfortunately over the last week the small area on the left lower leg has not really improved at all. Still the same dimensions. Thick rims of skin on the edges not well adhered. We have been using endoform under compression 9/20 comes in today with a fairly marked deterioration. The area is larger and tunnels posteriorly to a second small open area. There is some more significant tenderness reported by the patient around the wound. We had been using endoform and until last week everything looked fairly well 9/27; PCR culture from last week grew Staph epidermidis. I gave her  a course of [place]  which she is actually taking currently. Complains of nausea. We have been using silver alginate under compression. Objective Constitutional Sitting or standing Blood Pressure is within target range for patient.. Pulse regular and within target range for patient.Marland Kitchen Respirations regular, non-labored and within target range.. Temperature is normal and within the target range for the patient.Marland Kitchen Appears in no distress. Vitals Time Taken: 2:12 PM, Height: 60 in, Weight: 174 lbs, BMI: 34, Temperature: 98.6 F, Pulse: 83 bpm, Respiratory Rate: 16 breaths/min, Blood Pressure: 111/69 mmHg. General Notes: Wound exam; left medial lower leg. The wound itself now looks like a small circular area. The second connecting area has closed over and there is minimal undermining. The base of the wound looks fairly healthy there is no surrounding erythema. No palpable subcutaneous involvement Integumentary (Hair, Skin) Wound #1 status is Open. Original cause of wound was Trauma. The date acquired was: 08/22/2020. The wound has been in treatment 5 weeks. The wound is located on the Left,Medial Lower Leg. The wound measures 0.7cm length x 1cm width x 0.2cm depth; 0.55cm^2 area and 0.11cm^3 volume. There is Fat Layer (Subcutaneous Tissue) exposed. There is no tunneling or undermining noted. There is a medium amount of serosanguineous drainage noted. The wound margin is well defined and not attached to the wound base. There is large (67-100%) red, pink granulation within the wound bed. There is a small (1-33%) amount of necrotic tissue within the wound bed including Adherent Slough. Assessment Active Problems ICD-10 Non-pressure chronic ulcer of left calf with other specified severity Lymphedema, not elsewhere classified Cat-scratch disease Type 2 diabetes mellitus with other skin ulcer Procedures Wound #1 Pre-procedure diagnosis of Wound #1 is an Infection - not elsewhere classified located on the  Left,Medial Lower Leg . There was a Three Layer Compression Therapy Procedure by Levan Hurst, RN. Post procedure Diagnosis Wound #1: Same as Pre-Procedure Plan Follow-up Appointments: Return Appointment in 1 week. - [name] Bathing/ Shower/ Hygiene: May shower and wash wound with soap and water. Edema Control - Lymphedema / SCD / Other: Elevate legs to the level of the heart or above for 30 minutes daily and/or when sitting, a frequency of: - throughout the day Avoid standing for long periods of time. Patient to wear own compression stockings every day. WOUND #1: - Lower Leg Wound Laterality: Left, Medial Cleanser: Soap and Water Every Other Day/15 Days Discharge Instructions: May shower and wash wound with dial antibacterial soap and water prior to dressing change. Cleanser: Wound Cleanser Every Other Day/15 Days Discharge Instructions: Cleanse the wound with wound cleanser prior to applying a clean dressing using gauze sponges, not tissue or cotton balls. Prim Dressing: Promogran Prisma Matrix, 4.34 (sq in) (silver collagen) Every Other Day/15 Days ary Discharge Instructions: Moisten collagen with saline or hydrogel Secondary Dressing: Woven Gauze Sponge, Non-Sterile 4x4 in (Generic) Every Other Day/15 Days Discharge Instructions: Apply over primary dressing as directed. Com pression Wrap: ThreePress (3 layer compression wrap) Every Other Day/15 Days Discharge Instructions: Apply three layer compression as directed. 1. I change the primary dressing to silver collagen still under compression 2. Urgent completion of Elesa Hacker even if she needs to take this with food. Fortunately she did not have an extensive out-of-pocket cost Electronic Signature(s) Signed: 11/04/2020 4:21:38 PM By: Diane Ham MD Entered By: [name] on 11/04/2020 14:37:41 -------------------------------------------------------------------------------- SuperBill Details Patient Name: Date of  Service: [name] 11/04/2020 Medical Record Number: [id number] Patient Account Number: [id number] Date of Birth/Sex: Treating RN: 1955/11/13 (65 y.o. 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 E11.622 Type 2 diabetes mellitus with other skin ulcer Facility Procedures CPT4 Code: [id number] Description: (Facility Use Only) [phone] - [place] [facility] LT LEG Modifier: Quantity: 1 Physician Procedures : CPT4 Code Description Modifier [id number] - WC PHYS LEVEL 3 - EST PT ICD-10 Diagnosis Description L97.228 Non-pressure chronic ulcer of left calf with other specified severity I89.0 Lymphedema, not elsewhere classified Quantity: 1 Electronic Signature(s) Signed: 11/04/2020 4:21:38 PM By: Diane Ham MD Signed: 11/06/2020 4:49:13 PM By: Levan Hurst RN, BSN Entered By: Levan Hurst on 11/04/2020 14:56:44

## 2020-11-06 NOTE — Progress Notes (Signed)
[name], [name] ([id number]) Visit Report for 11/04/2020 Arrival Information Details Patient Name: Date of Service: [name] 11/04/2020 1:30 PM Medical Record Number: [id number] Patient Account Number: [id number] Date of Birth/Sex: Treating RN: 03-20-1955 (65 y.o. [name] Primary Care Skylar Flynt: [name] Other Clinician: Referring Berenize Gatlin: Treating Temia Debroux/Extender: [name] in Treatment: 5 Visit Information History Since Last Visit Added or deleted any medications: No Patient Arrived: Ambulatory Any new allergies or adverse reactions: No Arrival Time: 14:12 Had a fall or experienced change in No Accompanied By: alone activities of daily living that may affect Transfer Assistance: None risk of falls: Patient Identification Verified: Yes Signs or symptoms of abuse/neglect since last visito No Secondary Verification Process Completed: Yes Hospitalized since last visit: No Patient Requires Transmission-Based Precautions: No Implantable device outside of the clinic excluding No Patient Has Alerts: Yes cellular tissue based products placed in the center Patient Alerts: *No Lidocaine* since last visit: Has Dressing in Place as Prescribed: Yes Pain Present Now: No Electronic Signature(s) Signed: 11/06/2020 4:49:13 PM By: Levan Hurst RN, BSN Entered By: Levan Hurst on 11/04/2020 14:13:17 -------------------------------------------------------------------------------- Compression Therapy Details Patient Name: Date of Service: [name] 11/04/2020 1:30 PM Medical Record Number: [id number] Patient Account Number: [id number] Date of Birth/Sex: Treating RN: Sep 03, 1955 (65 y.o. [name] Primary Care Jewett Mcgann: [name] Other Clinician: Referring Lorenz Donley: Treating Hosey Burmester/Extender: [name] in Treatment: 5 Compression Therapy Performed for Wound Assessment: Wound #1 Left,Medial  Lower Leg Performed By: Clinician Levan Hurst, RN Compression Type: Three Layer Post Procedure Diagnosis Same as Pre-procedure Electronic Signature(s) Signed: 11/06/2020 4:49:13 PM By: Levan Hurst RN, BSN Entered By: Levan Hurst on 11/04/2020 14:29:40 -------------------------------------------------------------------------------- Encounter Discharge Information Details Patient Name: Date of Service: [name] 11/04/2020 1:30 PM Medical Record Number: [id number] Patient Account Number: [id number] Date of Birth/Sex: Treating RN: Feb 01, 1956 (65 y.o. [name] Primary Care Deandrea Rion: [name] Other Clinician: Referring Kaitland Lewellyn: Treating Britania Shreeve/Extender: [name] in Treatment: 5 Encounter Discharge Information Items Discharge Condition: Stable Ambulatory Status: Ambulatory Discharge Destination: Home Transportation: Private Auto Accompanied By: alone Schedule Follow-up Appointment: Yes Clinical Summary of Care: Patient Declined Electronic Signature(s) Signed: 11/06/2020 4:49:13 PM By: Levan Hurst RN, BSN Entered By: Levan Hurst on 11/04/2020 15:02:29 -------------------------------------------------------------------------------- Lower Extremity Assessment Details Patient Name: Date of Service: [name] 11/04/2020 1:30 PM Medical Record Number: [id number] Patient Account Number: [id number] Date of Birth/Sex: Treating RN: 11-17-1955 (65 y.o. [name] Primary Care Ambriella Kitt: [name] Other Clinician: Referring Yeray Tomas: Treating Analisa Sledd/Extender: [name] in Treatment: 5 Edema Assessment Assessed: Shirlyn Goltz: No] Patrice Paradise: No] Edema: [Left: Ye] [Right: s] Calf Left: Right: Point of Measurement: 25 cm From Medial Instep 37.5 cm Ankle Left: Right: Point of Measurement: 7 cm From Medial Instep 23.5 cm Vascular Assessment Pulses: Dorsalis Pedis Palpable:  [Left:Yes] Electronic Signature(s) Signed: 11/06/2020 4:49:13 PM By: Levan Hurst RN, BSN Entered By: Levan Hurst on 11/04/2020 14:19:33 -------------------------------------------------------------------------------- Multi Wound Chart Details Patient Name: Date of Service: [name] 11/04/2020 1:30 PM Medical Record Number: [id number] Patient Account Number: [id number] Date of Birth/Sex: Treating RN: Jul 08, 1955 (65 y.o. [name], [name] Primary Care Ahnyla Mendel: [name] Other Clinician: Referring Danielly Ackerley: Treating Martrell Eguia/Extender: [name] in Treatment: 5 Vital Signs Height(in): 60 Pulse(bpm): 61 Weight(lbs): 174 Blood Pressure(mmHg): 111/69 Body Mass Index(BMI): 34 Temperature(F): 98.6 Respiratory Rate(breaths/min): 16 Photos: [N/A:N/A] Left, Medial Lower Leg N/A N/A Wound Location:  Trauma N/A N/A Wounding Event: Infection - not elsewhere classified N/A N/A Primary Etiology: Cataracts, Middle ear problems, N/A N/A Comorbid History: Asthma, Sleep Apnea, Hypertension, Type II Diabetes, Osteoarthritis, Confinement Anxiety 08/22/2020 N/A N/A Date Acquired: 5 N/A N/A Weeks of Treatment: Open N/A N/A Wound Status: 0.7x1x0.2 N/A N/A Measurements L x W x D (cm) 0.55 N/A N/A A (cm) : rea 0.11 N/A N/A Volume (cm) : -133.10% N/A N/A % Reduction in Area: -17.00% N/A N/A % Reduction in Volume: Full Thickness Without Exposed N/A N/A Classification: Support Structures Medium N/A N/A Exudate Amount: Serosanguineous N/A N/A Exudate Type: red, brown N/A N/A Exudate Color: Well defined, not attached N/A N/A Wound Margin: Large (67-100%) N/A N/A Granulation Amount: Red, Pink N/A N/A Granulation Quality: Small (1-33%) N/A N/A Necrotic Amount: Fat Layer (Subcutaneous Tissue): Yes N/A N/A Exposed Structures: Fascia: No Tendon: No Muscle: No Joint: No Bone: No Small (1-33%) N/A  N/A Epithelialization: Compression Therapy N/A N/A Procedures Performed: Treatment Notes Electronic Signature(s) Signed: 11/04/2020 4:21:38 PM By: Linton Ham MD Signed: 11/05/2020 5:21:18 PM By: Rhae Hammock RN Entered By: Linton Ham on 11/04/2020 14:35:05 -------------------------------------------------------------------------------- Multi-Disciplinary Care Plan Details Patient Name: Date of Service: [name]. 11/04/2020 1:30 PM Medical Record Number: [id number] Patient Account Number: [id number] Date of Birth/Sex: Treating RN: [birth year] (65 y.o. [name] Primary Care Aemon Koeller: Other Clinician: [name] Referring Shaleta Ruacho: Treating Tommie Dejoseph/Extender: [name] in Treatment: 5 Active Inactive Wound/Skin Impairment Nursing Diagnoses: Impaired tissue integrity Knowledge deficit related to ulceration/compromised skin integrity Goals: Patient/caregiver will verbalize understanding of skin care regimen Date Initiated: 09/30/2020 Target Resolution Date: 11/28/2020 Goal Status: Active Interventions: Assess patient/caregiver ability to obtain necessary supplies Assess patient/caregiver ability to perform ulcer/skin care regimen upon admission and as needed Assess ulceration(s) every visit Notes: Electronic Signature(s) Signed: 11/06/2020 4:49:13 PM By: Levan Hurst RN, BSN Entered By: Levan Hurst on 11/04/2020 14:26:36 -------------------------------------------------------------------------------- Pain Assessment Details Patient Name: Date of Service: [name] 11/04/2020 1:30 PM Medical Record Number: [id number] Patient Account Number: [id number] Date of Birth/Sex: Treating RN: 1955-03-08 (65 y.o. [name] Primary Care Yanette Tripoli: [name] Other Clinician: Referring Monnica Saltsman: Treating Viann Nielson/Extender: [name] in Treatment: 5 Active Problems Location of  Pain Severity and Description of Pain Patient Has Paino No Site Locations Pain Management and Medication Current Pain Management: Electronic Signature(s) Signed: 11/06/2020 4:49:13 PM By: Levan Hurst RN, BSN Entered By: Levan Hurst on 11/04/2020 14:14:44 -------------------------------------------------------------------------------- Patient/Caregiver Education Details Patient Name: Date of Service: [name] 9/27/2022andnbsp1:30 PM Medical Record Number: [id number] Patient Account Number: [id number] Date of Birth/Gender: Treating RN: [birth year] (64 y.o. [name] Primary Care Physician: [name] Other Clinician: Referring Physician: Treating Physician/Extender: [name] in Treatment: 5 Education Assessment Education Provided To: Patient Education Topics Provided Wound/Skin Impairment: Methods: Explain/Verbal Responses: State content correctly Electronic Signature(s) Signed: 11/06/2020 4:49:13 PM By: Levan Hurst RN, BSN Entered By: Levan Hurst on 11/04/2020 14:27:01 -------------------------------------------------------------------------------- Wound Assessment Details Patient Name: Date of Service: [name] 11/04/2020 1:30 PM Medical Record Number: [id number] Patient Account Number: [id number] Date of Birth/Sex: Treating RN: [birth year] (65 y.o. [name] Primary Care Siren Porrata: [name] Other Clinician: Referring Ronell Boldin: Treating Onia Shiflett/Extender: [name] in Treatment: 5 Wound Status Wound Number: 1 Primary Infection - not elsewhere classified Etiology: Wound Location: Left, Medial Lower Leg Wound Open Wounding Event: Trauma Status: Date Acquired: 08/22/2020 Comorbid Cataracts, Middle ear problems, Asthma, Sleep  Apnea, Weeks Of Treatment: 5 History: Hypertension, Type II Diabetes, Osteoarthritis, Confinement Clustered Wound: No Anxiety Photos Wound  Measurements Length: (cm) 0.7 Width: (cm) 1 Depth: (cm) 0.2 Area: (cm) 0.55 Volume: (cm) 0.11 % Reduction in Area: -133.1% % Reduction in Volume: -17% Epithelialization: Small (1-33%) Tunneling: No Undermining: No Wound Description Classification: Full Thickness Without Exposed Support Structures Wound Margin: Well defined, not attached Exudate Amount: Medium Exudate Type: Serosanguineous Exudate Color: red, brown Foul Odor After Cleansing: No Slough/Fibrino Yes Wound Bed Granulation Amount: Large (67-100%) Exposed Structure Granulation Quality: Red, Pink Fascia Exposed: No Necrotic Amount: Small (1-33%) Fat Layer (Subcutaneous Tissue) Exposed: Yes Necrotic Quality: Adherent Slough Tendon Exposed: No Muscle Exposed: No Joint Exposed: No Bone Exposed: No Treatment Notes Wound #1 (Lower Leg) Wound Laterality: Left, Medial Cleanser Soap and Water Discharge Instruction: May shower and wash wound with dial antibacterial soap and water prior to dressing change. Wound Cleanser Discharge Instruction: Cleanse the wound with wound cleanser prior to applying a clean dressing using gauze sponges, not tissue or cotton balls. Peri-Wound Care Sween Lotion (Moisturizing lotion) Discharge Instruction: Apply moisturizing lotion as directed Topical Primary Dressing Promogran Prisma Matrix, 4.34 (sq in) (silver collagen) Discharge Instruction: Moisten collagen with saline or hydrogel Secondary Dressing Woven Gauze Sponge, Non-Sterile 4x4 in Discharge Instruction: Apply over primary dressing as directed. Secured With Compression Wrap ThreePress (3 layer compression wrap) Discharge Instruction: Apply three layer compression as directed. Compression Stockings Add-Ons Electronic Signature(s) Signed: 11/06/2020 4:49:13 PM By: Levan Hurst RN, BSN Entered By: Levan Hurst on 11/04/2020 14:21:36 -------------------------------------------------------------------------------- [place]  Details Patient Name: Date of Service: [name] 11/04/2020 1:30 PM Medical Record Number: [id number] Patient Account Number: [id number] Date of Birth/Sex: Treating RN: 1955-09-09 (65 y.o. [name] Primary Care Hether Anselmo: [name] Other Clinician: Referring Camari Wisham: Treating Talynn Lebon/Extender: [name] in Treatment: 5 Vital Signs Time Taken: 14:12 Temperature (F): 98.6 Height (in): 60 Pulse (bpm): 83 Weight (lbs): 174 Respiratory Rate (breaths/min): 16 Body Mass Index (BMI): 34 Blood Pressure (mmHg): 111/69 Reference Range: 80 - 120 mg / dl Electronic Signature(s) Signed: 11/06/2020 4:49:13 PM By: Levan Hurst RN, BSN Entered By: Levan Hurst on 11/04/2020 14:16:41

## 2020-11-08 ENCOUNTER — Other Ambulatory Visit: Payer: Self-pay | Admitting: Allergy & Immunology

## 2020-11-10 ENCOUNTER — Other Ambulatory Visit: Payer: Self-pay

## 2020-11-11 ENCOUNTER — Encounter (HOSPITAL_BASED_OUTPATIENT_CLINIC_OR_DEPARTMENT_OTHER): Payer: Medicare HMO | Attending: Internal Medicine | Admitting: Internal Medicine

## 2020-11-11 ENCOUNTER — Other Ambulatory Visit: Payer: Self-pay

## 2020-11-11 DIAGNOSIS — E1151 Type 2 diabetes mellitus with diabetic peripheral angiopathy without gangrene: Secondary | ICD-10-CM | POA: Diagnosis not present

## 2020-11-11 DIAGNOSIS — L97228 Non-pressure chronic ulcer of left calf with other specified severity: Secondary | ICD-10-CM | POA: Diagnosis not present

## 2020-11-11 DIAGNOSIS — E11622 Type 2 diabetes mellitus with other skin ulcer: Secondary | ICD-10-CM | POA: Insufficient documentation

## 2020-11-11 DIAGNOSIS — L0889 Other specified local infections of the skin and subcutaneous tissue: Secondary | ICD-10-CM | POA: Diagnosis not present

## 2020-11-11 DIAGNOSIS — L97822 Non-pressure chronic ulcer of other part of left lower leg with fat layer exposed: Secondary | ICD-10-CM | POA: Diagnosis not present

## 2020-11-12 NOTE — Progress Notes (Signed)
[name], [name] ([id number]) Visit Report for 11/11/2020 HPI Details Patient Name: Date of Service: [name], [name] 11/11/2020 2:45 PM Medical Record Number: [id number] Patient Account Number: [id number] Date of Birth/Sex: Treating RN: [birth year] (65 y.o. [name], [name] Primary Care Provider: Scarlette Whitney Other Clinician: Referring Provider: Treating Provider/Extender: [name] in Treatment: 6 History of Present Illness HPI Description: ADMISSION 09/30/2020 This is a previously healthy 65 year old woman who is a type II diabetic but not on any current treatment. On 08/22/2020 she may have stepped on her pet cats tail and the cat violently attacked her biting her on her medial and lateral left lower leg. The cat is declined in the front. The patient was seen on 08/25/2020 by her primary physician diagnosed with cellulitis was given a shot of Rocephin and Augmentin for 7 days. The patient has had multiple visits since then including 09/03/2020 7/25 7/27 and most recently 8/12 mostly by her primary physician but also urgent care. She received a prolonged course of Augmentin, 7-day course of doxycycline and most recently an 8-day course of Nuzyra. In general looking at the pictures in epic and also the pictures on the patient's phone today things have gotten a lot better with intense angry erythema initially on the left medial lower leg with multiple small puncture wounds. Currently she has 1 small open area. There is an erythematous firm area medially. Everything is healed on the left although there is still some subcutaneous firmness to some of these areas. The patient stated that the maximum intensity of the infection she was running fevers in the mid 99 range but no other systemic symptoms. She still has 1 remaining small punched out area on the left medial calf. This still has some depth. There is firmness around this area which I think is probably subcutaneous  tissue necrosis and mild erythema but no tenderness or crepitus. 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 She has been on oral diuretics for edema in her leg. She tells me she was reviewed by cardiology a year ago ABI on the left leg was 0.98 10/07/2020; patient is back into clinic this week. Much better edema control in her left leg. The firm areas which I think were secondary to subcutaneous tissue necrosis all feel a lot better however she still has the small but deeply punched out wound on the left medial lower leg 9/6; the patient has 1 open area roughly 0.3 cm deep on the left lower leg. Much better condition of her skin. We have been using endoform under compression 9/13; unfortunately over the last week the small area on the left lower leg has not really improved at all. Still the same dimensions. Thick rims of skin on the edges not well adhered. We have been using endoform under compression 9/20 comes in today with a fairly marked deterioration. The area is larger and tunnels posteriorly to a second small open area. There is some more significant tenderness reported by the patient around the wound. We had been using endoform and until last week everything looked fairly well 9/27; PCR culture from last week grew Staph epidermidis. I gave her a course of Elesa Hacker which she is actually taking currently. Complains of nausea. We have been using silver alginate under compression. 10/4 the patient has 1 small open area which under illumination  looks just about fully epithelialized. In the interest of preventing a quick return I have put her back in 4-layer compression. She will be healed by next week Electronic Signature(s) Signed: 11/12/2020 10:14:24 AM By: Linton Ham MD Entered By: Linton Ham on 11/11/2020  16:38:23 -------------------------------------------------------------------------------- Physical Exam Details Patient Name: Date of Service: [name] 11/11/2020 2:45 PM Medical Record Number: [id number] Patient Account Number: [id number] Date of Birth/Sex: Treating RN: [birth year] (65 y.o. [name], [name] Primary Care Provider: Scarlette Whitney Other Clinician: Referring Provider: Treating Provider/Extender: [name] in Treatment: 6 Constitutional Sitting or standing Blood Pressure is within target range for patient.. Pulse regular and within target range for patient.Marland Kitchen Respirations regular, non-labored and within target range.. Temperature is normal and within the target range for the patient.Marland Kitchen Appears in no distress. Notes Wound exam; medial left lower leg. Almost fully epithelialized. Good edema control no evidence of surrounding infection. Electronic Signature(s) Signed: 11/12/2020 10:14:24 AM By: Linton Ham MD Entered By: Linton Ham on 11/11/2020 16:39:39 -------------------------------------------------------------------------------- Physician Orders Details Patient Name: Date of Service: [name] 11/11/2020 2:45 PM Medical Record Number: [id number] Patient Account Number: [id number] Date of Birth/Sex: Treating RN: 1955-09-30 (65 y.o. [name], [name] Primary Care Provider: Scarlette Whitney Other Clinician: Referring Provider: Treating Provider/Extender: [name] in Treatment: 6 Verbal / Phone Orders: No Diagnosis Coding Follow-up Appointments ppointment in 1 week. - [name] Return A Bathing/ Shower/ Hygiene May shower and wash wound with soap and water. Edema Control - Lymphedema / SCD / Other Elevate legs to the level of the heart or above for 30 minutes daily and/or when sitting, a frequency of: - throughout the day Avoid standing for long periods of time. Patient to wear own  compression stockings every day. Wound Treatment Wound #1 - Lower Leg Wound Laterality: Left, Medial Cleanser: Soap and Water 1 x Per Week/7 Days Discharge Instructions: May shower and wash wound with dial antibacterial soap and water prior to dressing change. Cleanser: Wound Cleanser 1 x Per Week/7 Days Discharge Instructions: Cleanse the wound with wound cleanser prior to applying a clean dressing using gauze sponges, not tissue or cotton balls. Peri-Wound Care: Sween Lotion (Moisturizing lotion) 1 x Per Week/7 Days Discharge Instructions: Apply moisturizing lotion as directed Prim Dressing: Promogran Prisma Matrix, 4.34 (sq in) (silver collagen) 1 x Per Week/7 Days ary Discharge Instructions: Moisten collagen with saline or hydrogel Secondary Dressing: Woven Gauze Sponge, Non-Sterile 4x4 in (Generic) 1 x Per Week/7 Days Discharge Instructions: Apply over primary dressing as directed. Compression Wrap: ThreePress (3 layer compression wrap) 1 x Per Week/7 Days Discharge Instructions: Apply three layer compression as directed. Electronic Signature(s) Signed: 11/11/2020 5:26:57 PM By: Rhae Hammock RN Signed: 11/12/2020 10:14:24 AM By: Linton Ham MD Entered By: Rhae Hammock on 11/11/2020 15:43:35 -------------------------------------------------------------------------------- Problem List Details Patient Name: Date of Service: [name], [name] 11/11/2020 2:45 PM Medical Record Number: [id number] Patient Account Number: [id number] Date of Birth/Sex: Treating RN: 1955/03/20 (65 y.o. [name], [name] Primary Care Provider: Scarlette Whitney Other Clinician: Referring Provider: Treating Provider/Extender: [name] in Treatment: 6 Active Problems ICD-10 Encounter Code Description Active Date MDM Diagnosis [phone] Non-pressure chronic ulcer of left calf with other specified severity 09/30/2020 No Yes I89.0 Lymphedema, not elsewhere classified  09/30/2020 No Yes A28.1 Cat-scratch disease 09/30/2020 No Yes E11.622 Type 2 diabetes mellitus with other skin ulcer 09/30/2020 No Yes Inactive Problems Resolved Problems Electronic Signature(s) Signed: 11/12/2020 10:14:24 AM  By: Linton Ham MD Entered By: Linton Ham on 11/11/2020 16:36:19 -------------------------------------------------------------------------------- Progress Note Details Patient Name: Date of Service: [name] 11/11/2020 2:45 PM Medical Record Number: [id number] Patient Account Number: [id number] Date of Birth/Sex: Treating RN: 07/28/1955 (65 y.o. [name], [name] Primary Care Provider: Scarlette Whitney Other Clinician: Referring Provider: Treating Provider/Extender: [name] in Treatment: 6 Subjective History of Present Illness (HPI) ADMISSION 09/30/2020 This is a previously healthy 65 year old woman who is a type II diabetic but not on any current treatment. On 08/22/2020 she may have stepped on her pet cats tail and the cat violently attacked her biting her on her medial and lateral left lower leg. The cat is declined in the front. The patient was seen on 08/25/2020 by her primary physician diagnosed with cellulitis was given a shot of Rocephin and Augmentin for 7 days. The patient has had multiple visits since then including 09/03/2020 7/25 7/27 and most recently 8/12 mostly by her primary physician but also urgent care. She received a prolonged course of Augmentin, 7-day course of doxycycline and most recently an 8-day course of Nuzyra. In general looking at the pictures in epic and also the pictures on the patient's phone today things have gotten a lot better with intense angry erythema initially on the left medial lower leg with multiple small puncture wounds. Currently she has 1 small open area. There is an erythematous firm area medially. Everything is healed on the left although there is still some subcutaneous firmness  to some of these areas. The patient stated that the maximum intensity of the infection she was running fevers in the mid 99 range but no other systemic symptoms. She still has 1 remaining small punched out area on the left medial calf. This still has some depth. There is firmness around this area which I think is probably subcutaneous tissue necrosis and mild erythema but no tenderness or crepitus. 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 She has been on oral diuretics for edema in her leg. She tells me she was reviewed by cardiology a year ago ABI on the left leg was 0.98 10/07/2020; patient is back into clinic this week. Much better edema control in her left leg. The firm areas which I think were secondary to subcutaneous tissue necrosis all feel a lot better however she still has the small but deeply punched out wound on the left medial lower leg 9/6; the patient has 1 open area roughly 0.3 cm deep on the left lower leg. Much better condition of her skin. We have been using endoform under compression 9/13; unfortunately over the last week the small area on the left lower leg has not really improved at all. Still the same dimensions. Thick rims of skin on the edges not well adhered. We have been using endoform under compression 9/20 comes in today with a fairly marked deterioration. The area is larger and tunnels posteriorly to a second small open area. There is some more significant tenderness reported by the patient around the wound. We had been using endoform and until last week everything looked fairly well 9/27; PCR culture from last week grew Staph epidermidis. I gave her a course of Elesa Hacker which she is actually taking currently. Complains of nausea. We have been using silver alginate under compression. 10/4 the patient  has 1  small open area which under illumination looks just about fully epithelialized. In the interest of preventing a quick return I have put her back in 4-layer compression. She will be healed by next week Objective Constitutional Sitting or standing Blood Pressure is within target range for patient.. Pulse regular and within target range for patient.Marland Kitchen Respirations regular, non-labored and within target range.. Temperature is normal and within the target range for the patient.Marland Kitchen Appears in no distress. Vitals Time Taken: 3:42 PM, Height: 60 in, Weight: 174 lbs, BMI: 34, Temperature: 98.7 F, Pulse: 74 bpm, Respiratory Rate: 17 breaths/min, Blood Pressure: 110/70 mmHg. General Notes: Wound exam; medial left lower leg. Almost fully epithelialized. Good edema control no evidence of surrounding infection. Integumentary (Hair, Skin) Wound #1 status is Open. Original cause of wound was Trauma. The date acquired was: 08/22/2020. The wound has been in treatment 6 weeks. The wound is located on the Left,Medial Lower Leg. The wound measures 0.1cm length x 0.1cm width x 0.1cm depth; 0.008cm^2 area and 0.001cm^3 volume. There is Fat Layer (Subcutaneous Tissue) exposed. There is no tunneling or undermining noted. There is a medium amount of serosanguineous drainage noted. The wound margin is well defined and not attached to the wound base. There is large (67-100%) red, pink granulation within the wound bed. There is a small (1-33%) amount of necrotic tissue within the wound bed including Adherent Slough. Assessment Active Problems ICD-10 Non-pressure chronic ulcer of left calf with other specified severity Lymphedema, not elsewhere classified Cat-scratch disease Type 2 diabetes mellitus with other skin ulcer Procedures Wound #1 Pre-procedure diagnosis of Wound #1 is an Infection - not elsewhere classified located on the Left,Medial Lower Leg . There was a Three Layer Compression Therapy Procedure by  Rhae Hammock, RN. Post procedure Diagnosis Wound #1: Same as Pre-Procedure Plan Follow-up Appointments: Return Appointment in 1 week. - [name] Bathing/ Shower/ Hygiene: May shower and wash wound with soap and water. Edema Control - Lymphedema / SCD / Other: Elevate legs to the level of the heart or above for 30 minutes daily and/or when sitting, a frequency of: - throughout the day Avoid standing for long periods of time. Patient to wear own compression stockings every day. WOUND #1: - Lower Leg Wound Laterality: Left, Medial Cleanser: Soap and Water 1 x Per Week/7 Days Discharge Instructions: May shower and wash wound with dial antibacterial soap and water prior to dressing change. Cleanser: Wound Cleanser 1 x Per Week/7 Days Discharge Instructions: Cleanse the wound with wound cleanser prior to applying a clean dressing using gauze sponges, not tissue or cotton balls. Peri-Wound Care: Sween Lotion (Moisturizing lotion) 1 x Per Week/7 Days Discharge Instructions: Apply moisturizing lotion as directed Prim Dressing: Promogran Prisma Matrix, 4.34 (sq in) (silver collagen) 1 x Per Week/7 Days ary Discharge Instructions: Moisten collagen with saline or hydrogel Secondary Dressing: Woven Gauze Sponge, Non-Sterile 4x4 in (Generic) 1 x Per Week/7 Days Discharge Instructions: Apply over primary dressing as directed. Com pression Wrap: ThreePress (3 layer compression wrap) 1 x Per Week/7 Days Discharge Instructions: Apply three layer compression as directed. 1. I am still going to use I am still going to use Prisma under 3 layer compression. She should be dischargeable next week 2. I think this patient had cat scratch disease or a necrotizing infection underneath the dermis. Electronic Signature(s) Signed: 11/12/2020 10:14:24 AM By: Linton Ham MD Entered By: Linton Ham on 11/11/2020  16:40:50 -------------------------------------------------------------------------------- SuperBill Details Patient Name: Date of Service: Diane Shoulder B. 11/11/2020  Medical Record Number: [id number] Patient Account Number: [id number] Date of Birth/Sex: Treating RN: [birth year] (65 y.o. [name], [name] Primary Care Provider: Scarlette Whitney Other Clinician: Referring Provider: Treating Provider/Extender: [name] in Treatment: 6 Diagnosis Coding ICD-10 Codes Code Description [phone] Non-pressure chronic ulcer of left calf with other specified severity I89.0 Lymphedema, not elsewhere classified A28.1 Cat-scratch disease E11.622 Type 2 diabetes mellitus with other skin ulcer Facility Procedures CPT4 Code: [id number] Description: (Facility Use Only) [phone] - [place] [facility] LT LEG Modifier: Quantity: 1 Physician Procedures : CPT4 Code Description Modifier [id number] - WC PHYS LEVEL 3 - EST PT ICD-10 Diagnosis Description L97.228 Non-pressure chronic ulcer of left calf with other specified severity I89.0 Lymphedema, not elsewhere classified Quantity: 1 Electronic Signature(s) Signed: 11/12/2020 10:14:24 AM By: Linton Ham MD Entered By: Linton Ham on 11/11/2020 16:41:19

## 2020-11-12 NOTE — Progress Notes (Signed)
[name], [name] ([id number]) Visit Report for 11/11/2020 Arrival Information Details Patient Name: Date of Service: [name], [name] 11/11/2020 2:45 PM Medical Record Number: [id number] Patient Account Number: [id number] Date of Birth/Sex: Treating RN: 1955/08/17 (65 y.o. [name], [name] Primary Care Tiffini Blacksher: [name] Other Clinician: Referring Anahit Klumb: Treating Jaki Steptoe/Extender: [name] in Treatment: 6 Visit Information History Since Last Visit Added or deleted any medications: No Patient Arrived: Ambulatory Any new allergies or adverse reactions: No Arrival Time: 15:32 Had a fall or experienced change in No Accompanied By: self activities of daily living that may affect Transfer Assistance: None risk of falls: Patient Identification Verified: Yes Signs or symptoms of abuse/neglect since last visito No Secondary Verification Process Completed: Yes Hospitalized since last visit: No Patient Requires Transmission-Based Precautions: No Implantable device outside of the clinic excluding No Patient Has Alerts: Yes cellular tissue based products placed in the center Patient Alerts: *No Lidocaine* since last visit: Has Dressing in Place as Prescribed: Yes Pain Present Now: No Electronic Signature(s) Signed: 11/11/2020 5:26:57 PM By: Rhae Hammock RN Entered By: Rhae Hammock on 11/11/2020 15:32:31 -------------------------------------------------------------------------------- Compression Therapy Details Patient Name: Date of Service: [name] 11/11/2020 2:45 PM Medical Record Number: [id number] Patient Account Number: [id number] Date of Birth/Sex: Treating RN: 08/28/1955 (65 y.o. [name], [name] Primary Care Lemont Sitzmann: [name] Other Clinician: Referring Mariesa Grieder: Treating Emylia Latella/Extender: [name] in Treatment: 6 Compression Therapy Performed for Wound Assessment: Wound #1  Left,Medial Lower Leg Performed By: Clinician Rhae Hammock, RN Compression Type: Three Layer Post Procedure Diagnosis Same as Pre-procedure Electronic Signature(s) Signed: 11/11/2020 5:26:57 PM By: Rhae Hammock RN Entered By: Rhae Hammock on 11/11/2020 15:55:33 -------------------------------------------------------------------------------- Encounter Discharge Information Details Patient Name: Date of Service: [name] 11/11/2020 2:45 PM Medical Record Number: [id number] Patient Account Number: [id number] Date of Birth/Sex: Treating RN: September 19, 1955 (65 y.o. [name], [name] Primary Care Braydon Kullman: [name] Other Clinician: Referring Travus Oren: Treating Arihana Ambrocio/Extender: [name] in Treatment: 6 Encounter Discharge Information Items Discharge Condition: Stable Ambulatory Status: Ambulatory Discharge Destination: Home Transportation: Private Auto Accompanied By: self Schedule Follow-up Appointment: Yes Clinical Summary of Care: Patient Declined Electronic Signature(s) Signed: 11/11/2020 5:26:57 PM By: Rhae Hammock RN Entered By: Rhae Hammock on 11/11/2020 15:44:28 -------------------------------------------------------------------------------- Lower Extremity Assessment Details Patient Name: Date of Service: [name] 11/11/2020 2:45 PM Medical Record Number: [id number] Patient Account Number: [id number] Date of Birth/Sex: Treating RN: Jun 23, 1955 (65 y.o. [name], [name] Primary Care Reta Norgren: [name] Other Clinician: Referring Kery Batzel: Treating Latayna Ritchie/Extender: [name] in Treatment: 6 Edema Assessment Assessed: Shirlyn Goltz: Yes] Patrice Paradise: No] Edema: [Left: Ye] [Right: s] Calf Left: Right: Point of Measurement: 25 cm From Medial Instep 37.5 cm Ankle Left: Right: Point of Measurement: 7 cm From Medial Instep 23.5 cm Vascular Assessment Pulses: Dorsalis  Pedis Palpable: [Left:Yes] Posterior Tibial Palpable: [Left:Yes] Electronic Signature(s) Signed: 11/11/2020 5:26:57 PM By: Rhae Hammock RN Entered By: Rhae Hammock on 11/11/2020 15:42:52 -------------------------------------------------------------------------------- Multi Wound Chart Details Patient Name: Date of Service: [name] 11/11/2020 2:45 PM Medical Record Number: [id number] Patient Account Number: [id number] Date of Birth/Sex: Treating RN: [birth year] (65 y.o. [name], [name] Primary Care Saesha Llerenas: [name] Other Clinician: Referring Amala Petion: Treating Xana Bradt/Extender: [name] in Treatment: 6 Vital Signs Height(in): 60 Pulse(bpm): 1 Weight(lbs): 174 Blood Pressure(mmHg): 110/70 Body Mass Index(BMI): 34 Temperature(F): 98.7 Respiratory Rate(breaths/min): 17 Photos: [1:No Photos Left, Medial Lower Leg] [N/A:N/A N/A] Wound  Location: [1:Trauma] [N/A:N/A] Wounding Event: [1:Infection - not elsewhere classified N/A] Primary Etiology: [1:Cataracts, Middle ear problems,] [N/A:N/A] Comorbid History: [1:Asthma, Sleep Apnea, Hypertension, Type II Diabetes, Osteoarthritis, Confinement Anxiety 08/22/2020] [N/A:N/A] Date Acquired: [1:6] [N/A:N/A] Weeks of Treatment: [1:Open] [N/A:N/A] Wound Status: [1:0.1x0.1x0.1] [N/A:N/A] Measurements L x W x D (cm) [1:0.008] [N/A:N/A] A (cm) : rea [1:0.001] [N/A:N/A] Volume (cm) : [1:96.60%] [N/A:N/A] % Reduction in Area: [1:98.90%] [N/A:N/A] % Reduction in Volume: [1:Full Thickness Without Exposed] [N/A:N/A] Classification: [1:Support Structures Medium] [N/A:N/A] Exudate Amount: [1:Serosanguineous] [N/A:N/A] Exudate Type: [1:red, brown] [N/A:N/A] Exudate Color: [1:Well defined, not attached] [N/A:N/A] Wound Margin: [1:Large (67-100%)] [N/A:N/A] Granulation Amount: [1:Red, Pink] [N/A:N/A] Granulation Quality: [1:Small (1-33%)] [N/A:N/A] Necrotic Amount: [1:Fat Layer  (Subcutaneous Tissue): Yes N/A] Exposed Structures: [1:Fascia: No Tendon: No Muscle: No Joint: No Bone: No Small (1-33%)] [N/A:N/A] Epithelialization: [1:Compression Therapy] [N/A:N/A] Treatment Notes Wound #1 (Lower Leg) Wound Laterality: Left, Medial Cleanser Soap and Water Discharge Instruction: May shower and wash wound with dial antibacterial soap and water prior to dressing change. Wound Cleanser Discharge Instruction: Cleanse the wound with wound cleanser prior to applying a clean dressing using gauze sponges, not tissue or cotton balls. Peri-Wound Care Sween Lotion (Moisturizing lotion) Discharge Instruction: Apply moisturizing lotion as directed Topical Primary Dressing Promogran Prisma Matrix, 4.34 (sq in) (silver collagen) Discharge Instruction: Moisten collagen with saline or hydrogel Secondary Dressing Woven Gauze Sponge, Non-Sterile 4x4 in Discharge Instruction: Apply over primary dressing as directed. Secured With Compression Wrap ThreePress (3 layer compression wrap) Discharge Instruction: Apply three layer compression as directed. Compression Stockings Add-Ons Electronic Signature(s) Signed: 11/11/2020 5:26:57 PM By: Rhae Hammock RN Signed: 11/12/2020 10:14:24 AM By: Linton Ham MD Entered By: Linton Ham on 11/11/2020 16:36:26 -------------------------------------------------------------------------------- Multi-Disciplinary Care Plan Details Patient Name: Date of Service: [name], [name] 11/11/2020 2:45 PM Medical Record Number: [id number] Patient Account Number: [id number] Date of Birth/Sex: Treating RN: January 22, 1956 (65 y.o. [name], [name] Primary Care Zniyah Midkiff: [name] Other Clinician: Referring Aariah Godette: Treating Perseus Westall/Extender: [name] in Treatment: 6 Active Inactive Wound/Skin Impairment Nursing Diagnoses: Impaired tissue integrity Knowledge deficit related to ulceration/compromised skin  integrity Goals: Patient/caregiver will verbalize understanding of skin care regimen Date Initiated: 09/30/2020 Target Resolution Date: 11/28/2020 Goal Status: Active Interventions: Assess patient/caregiver ability to obtain necessary supplies Assess patient/caregiver ability to perform ulcer/skin care regimen upon admission and as needed Assess ulceration(s) every visit Notes: Electronic Signature(s) Signed: 11/11/2020 5:26:57 PM By: Rhae Hammock RN Entered By: Rhae Hammock on 11/11/2020 15:43:40 -------------------------------------------------------------------------------- Pain Assessment Details Patient Name: Date of Service: [name] 11/11/2020 2:45 PM Medical Record Number: [id number] Patient Account Number: [id number] Date of Birth/Sex: Treating RN: Apr 21, 1955 (64 y.o. [name], [name] Primary Care Kimmi Acocella: [name] Other Clinician: Referring Inika Bellanger: Treating Cheetara Hoge/Extender: [name] in Treatment: 6 Active Problems Location of Pain Severity and Description of Pain Patient Has Paino No Site Locations Pain Management and Medication Current Pain Management: Electronic Signature(s) Signed: 11/11/2020 5:26:57 PM By: Rhae Hammock RN Entered By: Rhae Hammock on 11/11/2020 15:42:46 -------------------------------------------------------------------------------- Patient/Caregiver Education Details Patient Name: Date of Service: [name] 10/4/2022andnbsp2:45 PM Medical Record Number: [id number] Patient Account Number: [id number] Date of Birth/Gender: Treating RN: 07-Sep-1955 (65 y.o. Benjaman Lobe Primary Care Physician: [name] Other Clinician: Referring Physician: Treating Physician/Extender: [name] in Treatment: 6 Education Assessment Education Provided To: Patient Education Topics Provided Basic Hygiene: Methods: Explain/Verbal Responses:  Reinforcements needed, State content correctly Electronic Signature(s) Signed: 11/11/2020 5:26:57 PM By:  Rhae Hammock RN Entered By: Rhae Hammock on 11/11/2020 15:43:54 -------------------------------------------------------------------------------- Wound Assessment Details Patient Name: Date of Service: [name], [name] 11/11/2020 2:45 PM Medical Record Number: [id number] Patient Account Number: [id number] Date of Birth/Sex: Treating RN: 31-Oct-1955 (65 y.o. [name], [name] Primary Care Wynn Alldredge: [name] Other Clinician: Referring Nyara Capell: Treating Alp Goldwater/Extender: [name] in Treatment: 6 Wound Status Wound Number: 1 Primary Infection - not elsewhere classified Etiology: Wound Location: Left, Medial Lower Leg Wound Open Wounding Event: Trauma Status: Date Acquired: 08/22/2020 Comorbid Cataracts, Middle ear problems, Asthma, Sleep Apnea, Weeks Of Treatment: 6 History: Hypertension, Type II Diabetes, Osteoarthritis, Confinement Clustered Wound: No Anxiety Wound Measurements Length: (cm) 0.1 Width: (cm) 0.1 Depth: (cm) 0.1 Area: (cm) 0.008 Volume: (cm) 0.001 % Reduction in Area: 96.6% % Reduction in Volume: 98.9% Epithelialization: Small (1-33%) Tunneling: No Undermining: No Wound Description Classification: Full Thickness Without Exposed Support Structures Wound Margin: Well defined, not attached Exudate Amount: Medium Exudate Type: Serosanguineous Exudate Color: red, brown Foul Odor After Cleansing: No Slough/Fibrino Yes Wound Bed Granulation Amount: Large (67-100%) Exposed Structure Granulation Quality: Red, Pink Fascia Exposed: No Necrotic Amount: Small (1-33%) Fat Layer (Subcutaneous Tissue) Exposed: Yes Necrotic Quality: Adherent Slough Tendon Exposed: No Muscle Exposed: No Joint Exposed: No Bone Exposed: No Treatment Notes Wound #1 (Lower Leg) Wound Laterality: Left, Medial Cleanser Soap and  Water Discharge Instruction: May shower and wash wound with dial antibacterial soap and water prior to dressing change. Wound Cleanser Discharge Instruction: Cleanse the wound with wound cleanser prior to applying a clean dressing using gauze sponges, not tissue or cotton balls. Peri-Wound Care Sween Lotion (Moisturizing lotion) Discharge Instruction: Apply moisturizing lotion as directed Topical Primary Dressing Promogran Prisma Matrix, 4.34 (sq in) (silver collagen) Discharge Instruction: Moisten collagen with saline or hydrogel Secondary Dressing Woven Gauze Sponge, Non-Sterile 4x4 in Discharge Instruction: Apply over primary dressing as directed. Secured With Compression Wrap ThreePress (3 layer compression wrap) Discharge Instruction: Apply three layer compression as directed. Compression Stockings Add-Ons Electronic Signature(s) Signed: 11/11/2020 5:26:57 PM By: Rhae Hammock RN Signed: 11/11/2020 5:26:57 PM By: Rhae Hammock RN Entered By: Rhae Hammock on 11/11/2020 15:43:08 -------------------------------------------------------------------------------- Vitals Details Patient Name: Date of Service: [name] 11/11/2020 2:45 PM Medical Record Number: [id number] Patient Account Number: [id number] Date of Birth/Sex: Treating RN: 02-Dec-1955 (65 y.o. [name], [name] Primary Care Eliaz Fout: [name] Other Clinician: Referring Baylei Siebels: Treating Alva Kuenzel/Extender: [name] in Treatment: 6 Vital Signs Time Taken: 15:42 Temperature (F): 98.7 Height (in): 60 Pulse (bpm): 74 Weight (lbs): 174 Respiratory Rate (breaths/min): 17 Body Mass Index (BMI): 34 Blood Pressure (mmHg): 110/70 Reference Range: 80 - 120 mg / dl Electronic Signature(s) Signed: 11/11/2020 5:26:57 PM By: Rhae Hammock RN Entered By: Rhae Hammock on 11/11/2020 15:42:41

## 2020-11-14 ENCOUNTER — Encounter: Payer: Self-pay | Admitting: Gastroenterology

## 2020-11-14 ENCOUNTER — Ambulatory Visit (INDEPENDENT_AMBULATORY_CARE_PROVIDER_SITE_OTHER): Payer: Medicare HMO | Admitting: Gastroenterology

## 2020-11-14 VITALS — BP 110/70 | HR 65 | Ht 60.0 in | Wt 178.1 lb

## 2020-11-14 DIAGNOSIS — F411 Generalized anxiety disorder: Secondary | ICD-10-CM | POA: Diagnosis not present

## 2020-11-14 DIAGNOSIS — R143 Flatulence: Secondary | ICD-10-CM

## 2020-11-14 DIAGNOSIS — Z8719 Personal history of other diseases of the digestive system: Secondary | ICD-10-CM | POA: Diagnosis not present

## 2020-11-14 DIAGNOSIS — Z9889 Other specified postprocedural states: Secondary | ICD-10-CM

## 2020-11-14 DIAGNOSIS — R490 Dysphonia: Secondary | ICD-10-CM

## 2020-11-14 DIAGNOSIS — R070 Pain in throat: Secondary | ICD-10-CM

## 2020-11-14 DIAGNOSIS — R110 Nausea: Secondary | ICD-10-CM

## 2020-11-14 DIAGNOSIS — R69 Illness, unspecified: Secondary | ICD-10-CM | POA: Diagnosis not present

## 2020-11-14 MED ORDER — PROMETHAZINE HCL 25 MG PO TABS: 25.0000 mg | ORAL_TABLET | Freq: Four times a day (QID) | ORAL | 2 refills | Status: DC | PRN

## 2020-11-14 NOTE — Patient Instructions (Signed)
If you are age 64 or older, your body mass index should be between 23-30. Your Body mass index is 34.79 kg/m. If this is out of the aforementioned range listed, please consider follow up with your Primary Care Provider.  If you are age 64 or younger, your body mass index should be between 19-25. Your Body mass index is 34.79 kg/m. If this is out of the aformentioned range listed, please consider follow up with your Primary Care Provider.   __________________________________________________________  The [facility] providers would like to encourage you to use [facility] to communicate with providers for non-urgent requests or questions.  Due to long hold times on the telephone, sending your provider a message by [facility] may be a faster and more efficient way to get a response.  Please allow 48 business hours for a response.  Please remember that this is for non-urgent requests.   You have been scheduled for an endoscopy. Please follow written instructions given to you at your visit today. If you use inhalers (even only as needed), please bring them with you on the day of your procedure.  You have been scheduled for an Upper GI Series at [facility]. Your appointment is on 11-24-2020 at 11am. Please arrive 15 minutes prior to your test for registration. Make sure not to eat or drink anything after midnight on the night before your test. If you need to reschedule, please call radiology at [phone]. ________________________________________________________________ An upper GI series uses x rays to help diagnose problems of the upper GI tract, which includes the esophagus, stomach, and duodenum. The duodenum is the first part of the small intestine. An upper GI series is conducted by a radiology technologist or a radiologist--a doctor who specializes in x-ray imaging--at a hospital or outpatient center. While sitting or standing in front of an x-ray machine, the patient drinks barium liquid,  which is often white and has a chalky consistency and taste. The barium liquid coats the lining of the upper GI tract and makes signs of disease show up more clearly on x rays. X-ray video, called fluoroscopy, is used to view the barium liquid moving through the esophagus, stomach, and duodenum. Additional x rays and fluoroscopy are performed while the patient lies on an x-ray table. To fully coat the upper GI tract with barium liquid, the technologist or radiologist may press on the abdomen or ask the patient to change position. Patients hold still in various positions, allowing the technologist or radiologist to take x rays of the upper GI tract at different angles. If a technologist conducts the upper GI series, a radiologist will later examine the images to look for problems.  This test typically takes about 1 hour to complete. __________________________________________________________________   _______________________________________________________  Food Guidelines for those with chronic digestive trouble:  Many people have difficulty digesting certain foods, causing a variety of distressing and embarrassing symptoms such as abdominal pain, bloating and gas.  These foods may need to be avoided or consumed in small amounts.  Here are some tips that might be helpful for you.  1.   Lactose intolerance is the difficulty or complete inability to digest lactose, the natural sugar in milk and anything made from milk.  This condition is harmless, common, and can begin any time during life.  Some people can digest a modest amount of lactose while others cannot tolerate any.  Also, not all dairy products contain equal amounts of lactose.  For example, hard cheeses such as parmesan have  less lactose than soft cheeses such as cheddar.  Yogurt has less lactose than milk or cheese.  Many packaged foods (even many brands of bread) have milk, so read ingredient lists carefully.  It is difficult to test for lactose  intolerance, so just try avoiding lactose as much as possible for a week and see what happens with your symptoms.  If you seem to be lactose intolerant, the best plan is to avoid it (but make sure you get calcium from another source).  The next best thing is to use lactase enzyme supplements, available over the counter everywhere.  Just know that many lactose intolerant people need to take several tablets with each serving of dairy to avoid symptoms.  Lastly, a lot of restaurant food is made with milk or butter.  Many are things you might not suspect, such as mashed potatoes, rice and pasta (cooked with butter) and "grilled" items.  If you are lactose intolerant, it never hurts to ask your server what has milk or butter.  2.   Fiber is an important part of your diet, but not all fiber is well-tolerated.  Insoluble fiber such as bran is often consumed by normal gut bacteria and converted into gas.  Soluble fiber such as oats, squash, carrots and green beans are typically tolerated better.  3.   Some types of carbohydrates can be poorly digested.  Examples include: fructose (apples, cherries, pears, raisins and other dried fruits), fructans (onions, zucchini, large amounts of wheat), sorbitol/mannitol/xylitol and sucralose/Splenda (common artificial sweeteners), and raffinose (lentils, broccoli, cabbage, asparagus, brussel sprouts, many types of beans).  Do a Development worker, community for [employer] and you will find helpful information. Beano, a dietary supplement, will often help with raffinose-containing foods.  As with lactase tablets, you may need several per serving.  4.   Whenever possible, avoid processed food&meats and chemical additives.  High fructose corn syrup, a common sweetener, may be difficult to digest.  Eggs and soy (comes from the soybean, and added to many foods now) are other common bloating/gassy foods.  5.  Regarding gluten:  gluten is a protein mainly found in wheat, but also rye and barley.   There is a condition called celiac sprue, which is an inflammatory reaction in the small intestine causing a variety of digestive symptoms.  Blood testing is highly reliable to look for this condition, and sometimes upper endoscopy with small bowel biopsies may be necessary to make the diagnosis.  Many patients who test negative for celiac sprue report improvement in their digestive symptoms when they switch to a gluten-free diet.  However, in these "non-celiac gluten sensitive" patients, the true role of gluten in their symptoms is unclear.  Reducing carbohydrates in general may decrease the gas and bloating caused when gut bacteria consume carbs. Also, some of these patients may actually be intolerant of the baker's yeast in bread products rather than the gluten.  Flatbread and other reduced yeast breads might therefore be tolerated.  There is no specific testing available for most food intolerances, which are discovered mainly by dietary elimination.  Please do not embark on a gluten free diet unless directed by your doctor, as it is highly restrictive, and may lead to nutritional deficiencies if not carefully monitored.  Lastly, beware of internet claims offering "personalized" tests for food intolerances.  Such testing has no reliable scientific evidence to support its reliability and correlation to symptoms.    6.  The best advice is old advice, especially for those  with chronic digestive trouble - try to eat "clean".  Balanced diet, avoid processed food, plenty of fruits and vegetables, cut down the sugar, minimal alcohol, avoid tobacco. Make time to care for yourself, get enough sleep, exercise when you can, reduce stress.  Your guts will thank you for it.   - Dr. Herma [facility]  ____________________________________________________________   It was a pleasure to see you today!  Thank you for trusting me with your gastrointestinal care!

## 2020-11-14 NOTE — Progress Notes (Signed)
[facility] Consult Note:  History: [name] 11/14/2020  Referring provider: Janith Lima, MD  Reason for consult/chief complaint: Gastroesophageal Reflux (Patient states Dr.Teoh said that she has reflux going into her vocal cords. Patient states she can feel acid in her throat at night. ), Nausea (Nausea that comes and goes randomly.), Gas (Patient states she has gas that comes and goes periodically. ), and Abdominal Pain (RLQ pain that comes and goes separate from the gas that patient is having.)   Subjective  HPI:  [name] saw me in 2017 for persistent reflux symptoms with regurgitation and pyrosis despite high-dose PPI.  Breakthrough GERD confirmed on pH/impedance testing, and I felt there was likely some element of visceral hypersensitivity (patient was already on TCA for other indication).  She also had a reportedly 3 cm hiatal hernia on last upper endoscopy by Dr. Olevia Perches in February 2016.  At last visit with me in November 2017, Pete was agreeable to surgical referral for fundoplication and hernia repair, which she underwent with Dr. Johney Maine in January 2018. Allergy, pulmonary, ENT for cough and dysphonia - ENT Benjamine Mola) recommended she come back to see me. ___________________________  [name] went to her ENT physician for for about 18 months of dysphonia, with intermittent hoarseness and vocal fatigue affecting her singing voice.  Famotidine was started, which has provided little or no relief.  She does not get regurgitation.  Sometimes if she lays on the right side there will be a burning sensation of the right side of the neck.  She has occasional right flank pain with no associated symptoms or clear triggers or relieving factors.  She also has chronic sinus and allergy issues.  She tells me her ENT physician did a fiberoptic exam in the office and told her that her vocal cords did not come together as they should.  For years [name] has had occasional nausea that  does not seem to improve with ondansetron, Phenergan always worked best on her prior PCP prescribed it for her. Lastly, she has intermittent flatulence for unclear reasons, bowel habits are regular. ROS:  Review of Systems  Constitutional:  Negative for appetite change and unexpected weight change.  HENT:  Negative for mouth sores and voice change.   Eyes:  Negative for pain and redness.  Respiratory:  Negative for cough and shortness of breath.   Cardiovascular:  Negative for chest pain and palpitations.  Genitourinary:  Negative for dysuria and hematuria.  Musculoskeletal:  Positive for arthralgias. Negative for myalgias.  Skin:  Negative for pallor and rash.  Allergic/Immunologic: Positive for environmental allergies.  Neurological:  Negative for weakness and headaches.  Hematological:  Negative for adenopathy.  Psychiatric/Behavioral:         Anxiety    Past Medical History: 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  Hypothyroidism    Intermittent palpitations    cardiology--  dr [place]   Interstitial cystitis    Limited jaw range of motion    s/p  bilateral TMJ surgery,  age 65s   Migraines    Moderate persistent asthma    pulmologist-- dr Halford Chessman   OA (osteoarthritis)    both knees   OSA on CPAP    per last sleep study 09/ 2017  mild OSA, AHI13/hr   PONV (postoperative nausea and vomiting)    PVC (premature ventricular contraction)    Rosacea    Sensation of  pressure in bladder area    per pt intermittant   TMJ arthralgia    Urticaria    Wears glasses    White coat syndrome without hypertension    05-25-2017  per pt hx hypertension yrs ago, no issues after quitting stressful job     Past Surgical History: Past Surgical History:  Procedure Laterality Date   22 HOUR [place] STUDY N/A 09/22/2015   Procedure: 24 HOUR [place] STUDY;  Surgeon: Doran Stabler, MD;  Location: [facility];  Service: Gastroenterology;  Laterality: N/A;   ADENOIDECTOMY     ANAL FISSURE REPAIR  age 65  (65)   BREAST EXCISIONAL BIOPSY Right 04-16-2003  dr p. young  [facility]   benign   BUNIONECTOMY Right early 2000s   CARPAL TUNNEL RELEASE Left age 65 (65)   CHOLECYSTECTOMY OPEN  age 65   CYSTO WITH HYDRODISTENSION N/A 06/02/2017   Procedure: CYSTOSCOPY/HYDRODISTENSION OF BLADDER;  Surgeon: Irine Seal, MD;  Location: [facility];  Service: Urology;  Laterality: N/A;   CYSTO/ HYDRODISTENTION/  INSTILLATION THERAPY  1990s   DX LAPAROSCOPY/  DX HYSTEROSCOPY/  D & C  08-07-2007   dr dove  [place]  10-21-2008   dr Harolyn Rutherford  [facility]   ESOPHAGEAL MANOMETRY N/A 09/22/2015   Procedure: ESOPHAGEAL MANOMETRY (EM);  Surgeon: Doran Stabler, MD;  Location: [facility];  Service: Gastroenterology;  Laterality: N/A;  with impedence    FINGER ARTHROPLASTY Bilateral    left little finger 2016:  right middle finger 06/ 2018   KNEE ARTHROSCOPY Bilateral x3 left ;  x3  right-- last one age 65 (64)   NASAL SEPTUM SURGERY  age 65s   ROBOT ASSISTED REDUCTION PARAESOPHAGEAL HIATAL HERNIA/ TYPE 2 MEDIASTINAL DISSECTION/ PRIMARY HIATAL HERNIA REPAIR/ ANTERIOR & POSTERIOR GASTROPEXY/ NISSEN FUNDOPLICATION  [id number]   DR GROSS  [facility]   SINOSCOPY     TEMPOROMANDIBULAR JOINT SURGERY Bilateral x3  -- age 65s   per pt used graft   TONSILLECTOMY     TOTAL KNEE ARTHROPLASTY Left 12/26/2017   Procedure: LEFT TOTAL KNEE ARTHROPLASTY;  Surgeon: Gaynelle Arabian,  MD;  Location: [facility];  Service: Orthopedics;  Laterality: Left;  40min   TRANSTHORACIC ECHOCARDIOGRAM  12-06-2017   dr [place]   ef 55-60%, grade 1 diastoilc dysfunction, trivial MR   TRIGGER FINGER RELEASE Bilateral last one 2017   several release's bilaterally   ULNAR NERVE TRANSPOSITION Bilateral age 63 (40)     Family History: Family History  Problem Relation Age of Onset   Hypertension Mother        deceased from MVA complications   Thyroid disease Mother    Allergic rhinitis Mother    Testicular cancer Father    Allergic rhinitis Father    Colon polyps Sister    Healthy Sister    Healthy Brother  Coronary artery disease Other    Colon cancer Neg Hx    Stomach cancer Neg Hx     Social History: Social History   Socioeconomic History   Marital status: Divorced    Spouse name: Not on file   Number of children: 0   Years of education: Not on file   Highest education level: Bachelor's degree (e.g., BA, AB, BS)  Occupational History   Not on file  Tobacco Use   Smoking status: Never   Smokeless tobacco: Never  Vaping Use   Vaping Use: Never used  Substance and Sexual Activity   Alcohol use: Yes    Alcohol/week: 0.0 standard drinks    Comment: once every 3-4 months   Drug use: No   Sexual activity: Not on file  Other Topics Concern   Not on file  Social History Narrative   Not on file   Social Determinants of Health   Financial Resource Strain: Low Risk    Difficulty of Paying Living Expenses: Not hard at all  Food Insecurity: Not on file  Transportation Needs: No Transportation Needs   Lack of Transportation (Medical): No   Lack of Transportation (Non-Medical): No  Physical Activity: Not on file  Stress: Not on file  Social Connections: Not on file    Allergies: Allergies  Allergen Reactions   Emetrol Itching   Indomethacin Other (See Comments)    Muscle spasms Causes muscle spasms in neck   Iodinated Diagnostic Agents Itching    Flushed  and Fever, itch all over   Amlodipine Swelling    Peripheral edema   Carbamazepine Other (See Comments)    Easy and unusual bleeding    Pseudoephedrine Other (See Comments)    I fly off the walls  CANNOT SLEEP   Risperidone And Related Other (See Comments)    Stomach upset, insomnia, drooling, tremors, "jerks," sensitivity to touch.    Bupivacaine Hives   Pentazocine Other (See Comments)    Unknown reaction MADE ME "LACTATE"   Propoxyphene Itching and Nausea And Vomiting    darvocet   Sulfa Antibiotics Other (See Comments)    Unknown reaction   Tramadol Other (See Comments)    Patient can not remember   Aspirin Other (See Comments)    upset stomach, ringing in the ears   Codeine Itching and Other (See Comments)    Anything related to codeine   Etodolac Rash and Other (See Comments)   Propranolol Nausea Only and Other (See Comments)    Outpatient Meds: Current Outpatient Medications  Medication Sig Dispense Refill   acetaminophen (TYLENOL) 500 MG tablet Take 500 mg by mouth every 6 (six) hours as needed.     albuterol (VENTOLIN HFA) 108 (90 Base) MCG/ACT inhaler INHALE 1-2 PUFFS INTO THE LUNGS DAILY AS NEEDED FOR WHEEZING OR SHORTNESS OF BREATH. 18 each 1   Ascorbic Acid (VITAMIN C CR) 500 MG CPCR Take by mouth.     Brexpiprazole (REXULTI) 3 MG TABS Take by mouth.     Carbinoxamine Maleate 4 MG TABS Take 1 tablet (4 mg total) by mouth every 6 (six) hours. 180 tablet 1   celecoxib (CELEBREX) 200 MG capsule Take 200 mg by mouth daily.     Cinnamon 500 MG capsule Cinnamon 500 mg capsule     clonazePAM (KLONOPIN) 0.5 MG tablet Take 0.5 mg by mouth 2 (two) times daily as needed.     famotidine (PEPCID) 20 MG tablet Take 20 mg by mouth at bedtime.  fluticasone (FLONASE) 50 MCG/ACT nasal spray PLACE 1 SPRAY INTO BOTH NOSTRILS 2 (TWO) TIMES DAILY AS NEEDED FOR ALLERGIES OR RHINITIS. 48 mL 1   fluticasone (FLOVENT HFA) 220 MCG/ACT inhaler INHALE 1-2 PUFFS INTO THE LUNGS IN THE  MORNING AND AT BEDTIME. 1 each 2   HYDROcodone-acetaminophen (NORCO/VICODIN) 5-325 MG tablet Take 1 tablet by mouth at bedtime.     hydrocortisone 2.5 % ointment Apply topically 2 (two) times daily.     ketoconazole (NIZORAL) 2 % cream Apply topically daily.      levothyroxine (SYNTHROID) 50 MCG tablet Take 1 tablet (50 mcg total) by mouth daily before breakfast. 90 tablet 0   metoprolol tartrate (LOPRESSOR) 25 MG tablet TAKE 1 TABLET BY MOUTH TWICE A DAY 180 tablet 1   metroNIDAZOLE (METROCREAM) 0.75 % cream Apply 1 application topically 2 (two) times daily.     montelukast (SINGULAIR) 10 MG tablet TAKE 1 TABLET BY MOUTH EVERYDAY AT BEDTIME 90 tablet 1   MYRBETRIQ 50 MG TB24 tablet Take 50 mg by mouth daily.     NUZYRA 150 MG TABS Take by mouth.     omeprazole (PRILOSEC) 20 MG capsule omeprazole 20 mg capsule,delayed release     rizatriptan (MAXALT-MLT) 10 MG disintegrating tablet TAKE 1 TABLET EVERY DAY AS NEEDED FOR MIGRAINE, MAY REPEAT IN 2 HRS IF NEEDED. MAX 2 DOSES/WK 9 tablet 2   rosuvastatin (CRESTOR) 20 MG tablet Take 1 tablet (20 mg total) by mouth daily. 90 tablet 1   sertraline (ZOLOFT) 100 MG tablet Take 200 mg by mouth at bedtime.      sodium chloride (OCEAN) 0.65 % SOLN nasal spray Place 1 spray into both nostrils as needed for congestion.     torsemide (DEMADEX) 20 MG tablet Take 1 tablet (20 mg total) by mouth daily. 90 tablet 1   trihexyphenidyl (ARTANE) 2 MG tablet Take 2 mg by mouth daily.     zinc gluconate 50 MG tablet Take 50 mg by mouth daily.     zolpidem (AMBIEN) 10 MG tablet Take 10 mg by mouth at bedtime.     Azelastine HCl 0.15 % SOLN Place 1-2 sprays into both nostrils 2 (two) times daily. 12 mL 0   No current facility-administered medications for this visit.      ___________________________________________________________________ Objective   Exam:  BP 110/70   Pulse 65   Ht 5' (1.524 m)   Wt 178 lb 2 oz (80.8 kg)   LMP 03/05/2012 (Approximate)   SpO2  96%   BMI 34.79 kg/m  Wt Readings from Last 3 Encounters:  11/14/20 178 lb 2 oz (80.8 kg)  09/19/20 179 lb (81.2 kg)  09/03/20 179 lb (81.2 kg)   Antalgic gait.  Left lower leg boot General: Well-appearing, normal vocal quality Eyes: sclera anicteric, no redness ENT: oral mucosa moist without lesions, no cervical or supraclavicular lymphadenopathy CV: RRR without murmur, S1/S2, no JVD, no peripheral edema Resp: clear to auscultation bilaterally, normal RR and effort noted GI: soft, no tenderness, with active bowel sounds. No guarding or palpable organomegaly noted. Skin; warm and dry, no rash or jaundice noted Neuro: awake, alert and oriented x 3. Normal gross motor function and fluent speech    Assessment: Encounter Diagnoses  Name Primary?   Dysphonia Yes   History of gastroesophageal reflux (GERD)    S/P laparoscopic fundoplication    Throat pain    Nausea in adult    Flatulence     I am doubtful that Zorianna's  symptoms are reflux related.  She no longer has the regurgitation that troubled her before surgery, she no longer has a pyrosis in the chest, and she has had a fundoplication and hernia repair.  Chronic stable nausea  Flatulence, often dietary related. Plan: Upper endoscopy.  She was agreeable after discussion of procedure and risks.  The benefits and risks of the planned procedure were described in detail with the patient or (when appropriate) their health care proxy.  Risks were outlined as including, but not limited to, bleeding, infection, perforation, adverse medication reaction leading to cardiac or pulmonary decompensation, pancreatitis (if ERCP).  The limitation of incomplete mucosal visualization was also discussed.  No guarantees or warranties were given.  Upper GI series (no small bowel follow-through), to see if there is any demonstrable reflux, especially in supine position.  Written dietary advice given  Phenergan prescription given.  She says a 25 mg  dose worked better than 12.5mg .   Thank you for the courtesy of this consult.  Please call me with any questions or concerns.  Nelida Meuse III  CC: Referring provider noted above

## 2020-11-18 ENCOUNTER — Other Ambulatory Visit: Payer: Self-pay

## 2020-11-18 ENCOUNTER — Encounter (HOSPITAL_BASED_OUTPATIENT_CLINIC_OR_DEPARTMENT_OTHER): Payer: Medicare HMO | Admitting: Internal Medicine

## 2020-11-18 DIAGNOSIS — I890 Lymphedema, not elsewhere classified: Secondary | ICD-10-CM | POA: Diagnosis not present

## 2020-11-18 DIAGNOSIS — A281 Cat-scratch disease: Secondary | ICD-10-CM

## 2020-11-18 DIAGNOSIS — E11622 Type 2 diabetes mellitus with other skin ulcer: Secondary | ICD-10-CM

## 2020-11-18 DIAGNOSIS — L97228 Non-pressure chronic ulcer of left calf with other specified severity: Secondary | ICD-10-CM

## 2020-11-18 DIAGNOSIS — E1151 Type 2 diabetes mellitus with diabetic peripheral angiopathy without gangrene: Secondary | ICD-10-CM | POA: Diagnosis not present

## 2020-11-18 NOTE — Progress Notes (Signed)
XIANA, CARNS ([id number]) Visit Report for 11/18/2020 Chief Complaint Document Details Patient Name: Date of Service: [name], [name] 11/18/2020 2:30 PM Medical Record Number: [id number] Patient Account Number: [id number] Date of Birth/Sex: Treating RN: 03-23-1955 (65 y.o. [name], [name] Primary Care Provider: [name] Other Clinician: Referring Provider: Treating Provider/Extender: Jerl Santos in Treatment: 7 Information Obtained from: Patient Chief Complaint 09/30/2020; patient is here for a small remaining wound area on the left medial calf Electronic Signature(s) Signed: 11/18/2020 3:52:54 PM By: Kalman Shan DO Entered By: Kalman Shan on 11/18/2020 15:48:42 -------------------------------------------------------------------------------- HPI Details Patient Name: Date of Service: [name] 11/18/2020 2:30 PM Medical Record Number: [id number] Patient Account Number: [id number] Date of Birth/Sex: Treating RN: 1955/08/20 (65 y.o. Debby Bud Primary Care Provider: [name] Other Clinician: Referring Provider: Treating Provider/Extender: Jerl Santos in Treatment: 7 History of Present Illness HPI Description: ADMISSION 09/30/2020 This is a previously healthy 65 year old woman who is a type II diabetic but not on any current treatment. On 08/22/2020 she may have stepped on her pet cats tail and the cat violently attacked her biting her on her medial and lateral left lower leg. The cat is declined in the front. The patient was seen on 08/25/2020 by her primary physician diagnosed with cellulitis was given a shot of Rocephin and Augmentin for 7 days. The patient has had multiple visits since then including 09/03/2020 7/25 7/27 and most recently 8/12 mostly by her primary physician but also urgent care. She received a prolonged course of Augmentin, 7-day course of doxycycline and most recently an 8-day  course of Nuzyra. In general looking at the pictures in epic and also the pictures on the patient's phone today things have gotten a lot better with intense angry erythema initially on the left medial lower leg with multiple small puncture wounds. Currently she has 1 small open area. There is an erythematous firm area medially. Everything is healed on the left although there is still some subcutaneous firmness to some of these areas. The patient stated that the maximum intensity of the infection she was running fevers in the mid 99 range but no other systemic symptoms. She still has 1 remaining small punched out area on the left medial calf. This still has some depth. There is firmness around this area which I think is probably subcutaneous tissue necrosis and mild erythema but no tenderness or crepitus. 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 She has been on oral diuretics for edema in her leg. She tells me she was reviewed by cardiology a year ago ABI on the left leg was 0.98 10/07/2020; patient is back into clinic this week. Much better edema control in her left leg. The firm areas which I think were secondary to subcutaneous tissue necrosis all feel a lot better however she still has the small but deeply punched out wound on the left medial lower leg 9/6; the patient has 1 open area roughly 0.3 cm deep on the left lower leg. Much better condition of her skin. We have been using endoform under compression 9/13; unfortunately over the last week the small area on the left lower leg has not really improved at all. Still the same dimensions. Thick rims of skin on the edges not well adhered. We have been using endoform under  compression 9/20 comes in today with a fairly marked deterioration. The area is larger and tunnels posteriorly  to a second small open area. There is some more significant tenderness reported by the patient around the wound. We had been using endoform and until last week everything looked fairly well 9/27; PCR culture from last week grew Staph epidermidis. I gave her a course of Elesa Hacker which she is actually taking currently. Complains of nausea. We have been using silver alginate under compression. 10/4 the patient has 1 small open area which under illumination looks just about fully epithelialized. In the interest of preventing a quick return I have put her back in 4-layer compression. She will be healed by next week 10/11; patient presents for follow-up. She has no issues or complaints today. She has her juxta light compressions with her. Her wound is healed. Electronic Signature(s) Signed: 11/18/2020 3:52:54 PM By: Kalman Shan DO Entered By: Kalman Shan on 11/18/2020 15:50:03 -------------------------------------------------------------------------------- Physical Exam Details Patient Name: Date of Service: [name], [name] 11/18/2020 2:30 PM Medical Record Number: [id number] Patient Account Number: [id number] Date of Birth/Sex: Treating RN: 08-23-1955 (65 y.o. Debby Bud Primary Care Provider: [name] Other Clinician: Referring Provider: Treating Provider/Extender: Jerl Santos in Treatment: 7 Constitutional respirations regular, non-labored and within target range for patient.. Cardiovascular 2+ dorsalis pedis/posterior tibialis pulses. Psychiatric pleasant and cooperative. Notes T the left medial aspect there is epithelialization to previous wound site. Surrounding skin is intact. No signs of infection. o Electronic Signature(s) Signed: 11/18/2020 3:52:54 PM By: Kalman Shan DO Entered By: Kalman Shan on 11/18/2020 15:50:47 -------------------------------------------------------------------------------- Physician Orders  Details Patient Name: Date of Service: [name] 11/18/2020 2:30 PM Medical Record Number: [id number] Patient Account Number: [id number] Date of Birth/Sex: Treating RN: [birth year] (65 y.o. [name], [name] Primary Care Provider: [name] Other Clinician: Referring Provider: Treating Provider/Extender: Jerl Santos in Treatment: 7 Verbal / Phone Orders: No Diagnosis Coding ICD-10 Coding Code Description [phone] Non-pressure chronic ulcer of left calf with other specified severity I89.0 Lymphedema, not elsewhere classified A28.1 Cat-scratch disease E11.622 Type 2 diabetes mellitus with other skin ulcer Discharge From [facility] Services Discharge from [place] - Call if any future wound care needs. Edema Control - Lymphedema / SCD / Other Elevate legs to the level of the heart or above for 30 minutes daily and/or when sitting, a frequency of: - throughout the day Avoid standing for long periods of time. Patient to wear own compression stockings every day. - apply in the morning and remove at night. Exercise regularly Moisturize legs daily. - apply every night before bed. Electronic Signature(s) Signed: 11/18/2020 3:52:54 PM By: Kalman Shan DO Entered By: Kalman Shan on 11/18/2020 15:51:03 -------------------------------------------------------------------------------- Problem List Details Patient Name: Date of Service: [name] 11/18/2020 2:30 PM Medical Record Number: [id number] Patient Account Number: [id number] Date of Birth/Sex: Treating RN: [birth year] (65 y.o. 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 [name], [name] Primary Care Provider: [name] Other Clinician: Referring Provider: Treating Provider/Extender: Jerl Santos in Treatment: 7 Subjective Chief Complaint Information obtained from Patient 09/30/2020; patient is here for a small remaining wound area on the left medial calf History of Present Illness (HPI) ADMISSION 09/30/2020 This is a previously healthy 65 year old woman who is a type II diabetic but not on any current treatment. On 08/22/2020 she may have stepped on her pet cats tail and the cat violently attacked her biting her on her medial and lateral left lower leg. The cat is declined in the front. The patient was seen on 08/25/2020 by her primary physician diagnosed with cellulitis was given a shot of Rocephin and Augmentin for 7 days. The patient has had multiple visits since then including 09/03/2020 7/25 7/27 and most recently 8/12 mostly by her primary physician but also urgent care. She received a prolonged course of Augmentin, 7-day course of doxycycline and most recently an 8-day course of Nuzyra. In general looking at the pictures in epic and also the pictures on the patient's phone today things have gotten a lot better with intense angry erythema initially on the left medial lower leg with multiple small puncture  wounds. Currently she has 1 small open area. There is an erythematous firm area medially. Everything is healed on the left although there is still some subcutaneous firmness to some of these areas. The patient stated that the maximum intensity of the infection she was running fevers in the mid 99 range but no other systemic symptoms. She still has 1 remaining small punched out area on the left medial calf. This still has some depth. There is firmness around this area which I think is probably subcutaneous tissue necrosis and mild erythema but no tenderness or crepitus. 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 She has been on oral diuretics for edema in her leg. She tells me she was reviewed by cardiology a year ago ABI on the left leg was 0.98 10/07/2020; patient is back into clinic this week. Much better edema control in her left leg. The firm areas which I think were secondary to subcutaneous tissue necrosis all feel a lot better however she still has the small but deeply punched out wound on the left medial lower leg 9/6; the patient has 1 open area roughly 0.3 cm deep on the left lower leg. Much better condition of her skin. We have been using endoform under compression 9/13; unfortunately over the last week the small area on the left lower leg has not really improved at all. Still the same dimensions. Thick rims of skin on the edges not well adhered. We have been using endoform under compression 9/20 comes in today with a fairly marked deterioration. The area is larger and tunnels posteriorly to a second small open area. There is some more significant tenderness reported by the patient around the wound. We had been using endoform and until last week everything looked fairly well 9/27; PCR culture from last week grew Staph  epidermidis. I gave her a course of Elesa Hacker which she is actually taking currently. Complains of nausea. We have been using silver alginate  under compression. 10/4 the patient has 1 small open area which under illumination looks just about fully epithelialized. In the interest of preventing a quick return I have put her back in 4-layer compression. She will be healed by next week 10/11; patient presents for follow-up. She has no issues or complaints today. She has her juxta light compressions with her. Her wound is healed. Patient History Information obtained from Patient. Family History Cancer - Father,Mother, Heart Disease - Mother,Paternal Grandparents,Maternal Grandparents, Hypertension - Mother, Lung Disease - Paternal Grandparents, Stroke - Maternal Grandparents, Thyroid Problems - Mother,Siblings, No family history of Diabetes, Hereditary Spherocytosis, Kidney Disease, Seizures, Tuberculosis. Social History Never smoker, Marital Status - Divorced, Alcohol Use - Rarely, Drug Use - No History, Caffeine Use - Moderate. Medical History Eyes Patient has history of Cataracts Ear/Nose/Mouth/Throat Patient has history of Middle ear problems - Tinnitis Respiratory Patient has history of Asthma, Sleep Apnea Cardiovascular Patient has history of Hypertension Endocrine Patient has history of Type II Diabetes Musculoskeletal Patient has history of Osteoarthritis Psychiatric Patient has history of Confinement Anxiety Medical A Surgical History Notes nd Cardiovascular Premature Ventricular Contraction Gastrointestinal GERD, IBS Endocrine Hypothyroidis Genitourinary Interstitial Cystitis Neurologic Neuroleptic Induced Parkinsonism Oncologic Skin Cancer-Ear Migraines Psychiatric Depression Objective Constitutional respirations regular, non-labored and within target range for patient.. Vitals Time Taken: 2:30 PM, Height: 60 in, Weight: 174 lbs, BMI: 34, Temperature: 98.3 F,  Pulse: 74 bpm, Respiratory Rate: 20 breaths/min, Blood Pressure: 131/84 mmHg. Cardiovascular 2+ dorsalis pedis/posterior tibialis pulses. Psychiatric pleasant and cooperative. General Notes: T the left medial aspect there is epithelialization to previous wound site. Surrounding skin is intact. No signs of infection. o Integumentary (Hair, Skin) Wound #1 status is Healed - Epithelialized. Original cause of wound was Trauma. The date acquired was: 08/22/2020. The wound has been in treatment 7 weeks. The wound is located on the Left,Medial Lower Leg. The wound measures 0cm length x 0cm width x 0cm depth; 0cm^2 area and 0cm^3 volume. There is no tunneling or undermining noted. There is a none present amount of drainage noted. The wound margin is well defined and not attached to the wound base. There is no granulation within the wound bed. There is no necrotic tissue within the wound bed. Assessment Active Problems ICD-10 Non-pressure chronic ulcer of left calf with other specified severity Lymphedema, not elsewhere classified Cat-scratch disease Type 2 diabetes mellitus with other skin ulcer Patient has done well with compression wraps and collagen. Her wound is healed. I recommended using daily compression stockings. She can follow-up as needed. Plan Discharge From [facility]: Discharge from [place] - Call if any future wound care needs. Edema Control - Lymphedema / SCD / Other: Elevate legs to the level of the heart or above for 30 minutes daily and/or when sitting, a frequency of: - throughout the day Avoid standing for long periods of time. Patient to wear own compression stockings every day. - apply in the morning and remove at night. Exercise regularly Moisturize legs daily. - apply every night before bed. 1. Daily compression stockings 2. Follow-up as needed 3. Discharge from clinic due to closed wound Electronic Signature(s) Signed: 11/18/2020 3:52:54 PM By: Kalman Shan DO Entered By: Kalman Shan on 11/18/2020 15:52:12 -------------------------------------------------------------------------------- HxROS Details Patient Name: Date of Service: [name] 11/18/2020 2:30 PM Medical Record Number: [id number] Patient Account Number: [id number] Date of Birth/Sex: Treating RN: 1956/01/17 (65 y.o. Debby Bud Primary Care Provider: [name] Other Clinician: Referring Provider: Treating Provider/Extender: Heber   Noreene Larsson in Treatment: 7 Information Obtained From Patient Eyes Medical History: Positive for: Cataracts Ear/Nose/Mouth/Throat Medical History: Positive for: Middle ear problems - Tinnitis Respiratory Medical History: Positive for: Asthma; Sleep Apnea Cardiovascular Medical History: Positive for: Hypertension Past Medical History Notes: Premature Ventricular Contraction Gastrointestinal Medical History: Past Medical History Notes: GERD, IBS Endocrine Medical History: Positive for: Type II Diabetes Past Medical History Notes: Hypothyroidis Treated with: Diet Genitourinary Medical History: Past Medical History Notes: Interstitial Cystitis Musculoskeletal Medical History: Positive for: Osteoarthritis Neurologic Medical History: Past Medical History Notes: Neuroleptic Induced Parkinsonism Oncologic Medical History: Past Medical History Notes: Skin Cancer-Ear Migraines Psychiatric Medical History: Positive for: Confinement Anxiety Past Medical History Notes: Depression HBO Extended History Items Eyes: Ear/Nose/Mouth/Throat: Cataracts Middle ear problems Immunizations Pneumococcal Vaccine: Received Pneumococcal Vaccination: Yes Received Pneumococcal Vaccination On or After 60th Birthday: No Implantable Devices None Family and Social History Cancer: Yes - Father,Mother; Diabetes: No; Heart Disease: Yes - Mother,Paternal Grandparents,Maternal Grandparents; Hereditary  Spherocytosis: No; Hypertension: Yes - Mother; Kidney Disease: No; Lung Disease: Yes - Paternal Grandparents; Seizures: No; Stroke: Yes - Maternal Grandparents; Thyroid Problems: Yes - Mother,Siblings; Tuberculosis: No; Never smoker; Marital Status - Divorced; Alcohol Use: Rarely; Drug Use: No History; Caffeine Use: Moderate; Financial Concerns: No; Food, Clothing or Shelter Needs: No; Support System Lacking: No; Transportation Concerns: No Electronic Signature(s) Signed: 11/18/2020 3:52:54 PM By: Kalman Shan DO Signed: 11/18/2020 5:07:29 PM By: Deon Pilling RN, BSN Entered By: Kalman Shan on 11/18/2020 15:50:10 -------------------------------------------------------------------------------- SuperBill Details Patient Name: Date of Service: [name] 11/18/2020 Medical Record Number: [id number] Patient Account Number: [id number] Date of Birth/Sex: Treating RN: 1955-02-21 (65 y.o. [name], [name] Primary Care Provider: [name] Other Clinician: Referring Provider: Treating Provider/Extender: Jerl Santos in Treatment: 7 Diagnosis Coding ICD-10 Codes Code Description [phone] Non-pressure chronic ulcer of left calf with other specified severity I89.0 Lymphedema, not elsewhere classified A28.1 Cat-scratch disease E11.622 Type 2 diabetes mellitus with other skin ulcer Facility Procedures CPT4 Code: [id number] Description: 99213 - WOUND CARE VISIT-LEV 3 EST PT Modifier: Quantity: 1 Physician Procedures : CPT4 Code Description Modifier [id number] - WC PHYS LEVEL 3 - EST PT ICD-10 Diagnosis Description L97.228 Non-pressure chronic ulcer of left calf with other specified severity I89.0 Lymphedema, not elsewhere classified A28.1 Cat-scratch disease  E11.622 Type 2 diabetes mellitus with other skin ulcer Quantity: 1 Electronic Signature(s) Signed: 11/18/2020 3:52:54 PM By: Kalman Shan DO Entered By: Kalman Shan on 11/18/2020  15:52:25

## 2020-11-19 NOTE — Progress Notes (Signed)
[name], [name] ([id number]) Visit Report for 11/18/2020 Arrival Information Details Patient Name: Date of Service: ANJALEE, COPE 11/18/2020 2:30 PM Medical Record Number: [id number] Patient Account Number: [id number] Date of Birth/Sex: Treating RN: September 25, 1955 (65 y.o. [name], [name] Primary Care Orvis Stann: [name] Other Clinician: Referring Dara Beidleman: Treating Andre Gallego/Extender: Jerl Santos in Treatment: 7 Visit Information History Since Last Visit Added or deleted any medications: No Patient Arrived: Ambulatory Any new allergies or adverse reactions: No Arrival Time: 14:33 Had a fall or experienced change in No Accompanied By: self activities of daily living that may affect Transfer Assistance: None risk of falls: Patient Identification Verified: Yes Signs or symptoms of abuse/neglect since No Secondary Verification Process Completed: Yes last visito Patient Requires Transmission-Based Precautions: No Hospitalized since last visit: No Patient Has Alerts: Yes Implantable device outside of the clinic No Patient Alerts: *No Lidocaine* excluding cellular tissue based products placed in the center since last visit: Has Dressing in Place as Prescribed: Yes Has Compression in Place as Prescribed: Yes Has Footwear/Offloading in Place as Yes Prescribed: Left: Surgical Whitney with Pressure Relief Insole Pain Present Now: No Electronic Signature(s) Signed: 11/18/2020 5:07:29 PM By: Deon Pilling RN, BSN Entered By: Deon Pilling on 11/18/2020 14:33:33 -------------------------------------------------------------------------------- Clinic Level of Care Assessment Details Patient Name: Date of Service: NIKYA, BUSLER 11/18/2020 2:30 PM Medical Record Number: [id number] Patient Account Number: [id number] Date of Birth/Sex: Treating RN: 07-May-1955 (65 y.o. [name], [name] Primary Care Cullan Launer: [name] Other Clinician: Referring  Charlei Ramsaran: Treating Nikkole Placzek/Extender: Jerl Santos in Treatment: 7 Clinic Level of Care Assessment Items TOOL 4 Quantity Score X- 1 0 Use when only an EandM is performed on FOLLOW-UP visit ASSESSMENTS - Nursing Assessment / Reassessment X- 1 10 Reassessment of Co-morbidities (includes updates in patient status) X- 1 5 Reassessment of Adherence to Treatment Plan ASSESSMENTS - Wound and Skin A ssessment / Reassessment X - Simple Wound Assessment / Reassessment - one wound 1 5 []  - 0 Complex Wound Assessment / Reassessment - multiple wounds X- 1 10 Dermatologic / Skin Assessment (not related to wound area) ASSESSMENTS - Focused Assessment X- 1 5 Circumferential Edema Measurements - multi extremities X- 1 10 Nutritional Assessment / Counseling / Intervention []  - 0 Lower Extremity Assessment (monofilament, tuning fork, pulses) []  - 0 Peripheral Arterial Disease Assessment (using hand held doppler) ASSESSMENTS - Ostomy and/or Continence Assessment and Care []  - 0 Incontinence Assessment and Management []  - 0 Ostomy Care Assessment and Management (repouching, etc.) PROCESS - Coordination of Care X - Simple Patient / Family Education for ongoing care 1 15 []  - 0 Complex (extensive) Patient / Family Education for ongoing care X- 1 10 Staff obtains Programmer, systems, Records, T Results / Process Orders est []  - 0 Staff telephones HHA, Nursing Homes / Clarify orders / etc []  - 0 Routine Transfer to another Facility (non-emergent condition) []  - 0 Routine Hospital Admission (non-emergent condition) []  - 0 New Admissions / Biomedical engineer / Ordering NPWT Apligraf, etc. , []  - 0 Emergency Hospital Admission (emergent condition) X- 1 10 Simple Discharge Coordination []  - 0 Complex (extensive) Discharge Coordination PROCESS - Special Needs []  - 0 Pediatric / Minor Patient Management []  - 0 Isolation Patient Management []  - 0 Hearing / Language /  Visual special needs []  - 0 Assessment of Community assistance (transportation, D/C planning, etc.) []  - 0 Additional assistance / Altered mentation []  - 0 Support Surface(s) Assessment (bed, cushion, seat, etc.)  INTERVENTIONS - Wound Cleansing / Measurement X - Simple Wound Cleansing - one wound 1 5 []  - 0 Complex Wound Cleansing - multiple wounds X- 1 5 Wound Imaging (photographs - any number of wounds) []  - 0 Wound Tracing (instead of photographs) X- 1 5 Simple Wound Measurement - one wound []  - 0 Complex Wound Measurement - multiple wounds INTERVENTIONS - Wound Dressings []  - 0 Small Wound Dressing one or multiple wounds []  - 0 Medium Wound Dressing one or multiple wounds []  - 0 Large Wound Dressing one or multiple wounds []  - 0 Application of Medications - topical []  - 0 Application of Medications - injection INTERVENTIONS - Miscellaneous []  - 0 External ear exam []  - 0 Specimen Collection (cultures, biopsies, blood, body fluids, etc.) []  - 0 Specimen(s) / Culture(s) sent or taken to Lab for analysis []  - 0 Patient Transfer (multiple staff / Civil Service fast streamer / Similar devices) []  - 0 Simple Staple / Suture removal (25 or less) []  - 0 Complex Staple / Suture removal (26 or more) []  - 0 Hypo / Hyperglycemic Management (close monitor of Blood Glucose) []  - 0 Ankle / Brachial Index (ABI) - do not check if billed separately X- 1 5 Vital Signs Has the patient been seen at the hospital within the last three years: Yes Total Score: 100 Level Of Care: New/Established - Level 3 Electronic Signature(s) Signed: 11/18/2020 5:07:29 PM By: Deon Pilling RN, BSN Entered By: Deon Pilling on 11/18/2020 14:44:00 -------------------------------------------------------------------------------- Encounter Discharge Information Details Patient Name: Date of Service: [name] 11/18/2020 2:30 PM Medical Record Number: [id number] Patient Account Number: [id number] Date of  Birth/Sex: Treating RN: [birth year] (65 y.o. [name] Primary Care Najae Rathert: [name] Other Clinician: Referring Birdie Beveridge: Treating Jonisha Kindig/Extender: Jerl Santos in Treatment: 7 Encounter Discharge Information Items Discharge Condition: Stable Ambulatory Status: Ambulatory Discharge Destination: Home Transportation: Private Auto Accompanied By: self Schedule Follow-up Appointment: No Clinical Summary of Care: Electronic Signature(s) Signed: 11/18/2020 5:07:29 PM By: Deon Pilling RN, BSN Entered By: Deon Pilling on 11/18/2020 15:24:35 -------------------------------------------------------------------------------- Lower Extremity Assessment Details Patient Name: Date of Service: [name] 11/18/2020 2:30 PM Medical Record Number: [id number] Patient Account Number: [id number] Date of Birth/Sex: Treating RN: 1955-12-21 (65 y.o. [name], [name] Primary Care Cayleb Jarnigan: [name] Other Clinician: Referring Emmalynn Pinkham: Treating Armarion Greek/Extender: Jerl Santos in Treatment: 7 Edema Assessment Assessed: Shirlyn Goltz: Yes] Patrice Paradise: No] Edema: [Left: N] [Right: o] Calf Left: Right: Point of Measurement: 25 cm From Medial Instep 35 cm Ankle Left: Right: Point of Measurement: 7 cm From Medial Instep 24 cm Vascular Assessment Pulses: Dorsalis Pedis Palpable: [Left:Yes] Electronic Signature(s) Signed: 11/18/2020 5:07:29 PM By: Deon Pilling RN, BSN Entered By: Deon Pilling on 11/18/2020 14:40:00 -------------------------------------------------------------------------------- Multi Wound Chart Details Patient Name: Date of Service: [name] 11/18/2020 2:30 PM Medical Record Number: [id number] Patient Account Number: [id number] Date of Birth/Sex: Treating RN: 04/07/1955 (65 y.o. [name], [name] Primary Care Trenice Mesa: [name] Other Clinician: Referring Teren Zurcher: Treating Thera Basden/Extender: Jerl Santos in Treatment: 7 Vital Signs Height(in): 60 Pulse(bpm): 82 Weight(lbs): 174 Blood Pressure(mmHg): 131/84 Body Mass Index(BMI): 34 Temperature(F): 98.3 Respiratory Rate(breaths/min): 20 Photos: [N/A:N/A] Left, Medial Lower Leg N/A N/A Wound Location: Trauma N/A N/A Wounding Event: Infection - not elsewhere classified N/A N/A Primary Etiology: Cataracts, Middle ear problems, N/A N/A Comorbid History: Asthma, Sleep Apnea, Hypertension, Type II Diabetes, Osteoarthritis, Confinement Anxiety 08/22/2020 N/A N/A Date Acquired: 7 N/A N/A Weeks  of Treatment: Healed - Epithelialized N/A N/A Wound Status: 0x0x0 N/A N/A Measurements L x W x D (cm) 0 N/A N/A A (cm) : rea 0 N/A N/A Volume (cm) : 100.00% N/A N/A % Reduction in Area: 100.00% N/A N/A % Reduction in Volume: Full Thickness Without Exposed N/A N/A Classification: Support Structures None Present N/A N/A Exudate Amount: Well defined, not attached N/A N/A Wound Margin: None Present (0%) N/A N/A Granulation Amount: None Present (0%) N/A N/A Necrotic Amount: Fascia: No N/A N/A Exposed Structures: Fat Layer (Subcutaneous Tissue): No Tendon: No Muscle: No Joint: No Bone: No Large (67-100%) N/A N/A Epithelialization: Treatment Notes Electronic Signature(s) Signed: 11/18/2020 3:52:54 PM By: Kalman Shan DO Signed: 11/18/2020 5:07:29 PM By: Deon Pilling RN, BSN Entered By: Kalman Shan on 11/18/2020 15:48:34 -------------------------------------------------------------------------------- Multi-Disciplinary Care Plan Details Patient Name: Date of Service: [name] 11/18/2020 2:30 PM Medical Record Number: [id number] Patient Account Number: [id number] Date of Birth/Sex: Treating RN: [birth year] (65 y.o. [name] Primary Care Quante Pettry: [name] Other Clinician: Referring Lindsay Soulliere: Treating Bridgid Printz/Extender: Jerl Santos in  Treatment: 7 Active Inactive Electronic Signature(s) Signed: 11/18/2020 5:07:29 PM By: Deon Pilling RN, BSN Entered By: Deon Pilling on 11/18/2020 14:42:19 -------------------------------------------------------------------------------- Pain Assessment Details Patient Name: Date of Service: [name] 11/18/2020 2:30 PM Medical Record Number: [id number] Patient Account Number: [id number] Date of Birth/Sex: Treating RN: January 14, 1956 (65 y.o. [name] Primary Care Shervon Kerwin: [name] Other Clinician: Referring Tarissa Kerin: Treating Ioannis Schuh/Extender: Jerl Santos in Treatment: 7 Active Problems Location of Pain Severity and Description of Pain Patient Has Paino No Site Locations Rate the pain. Current Pain Level: 0 Pain Management and Medication Current Pain Management: Medication: No Cold Application: No Rest: No Massage: No Activity: No T.E.N.S.: No Heat Application: No Leg drop or elevation: No Is the Current Pain Management Adequate: Adequate How does your wound impact your activities of daily livingo Sleep: No Bathing: No Appetite: No Relationship With Others: No Bladder Continence: No Emotions: No Bowel Continence: No Work: No Toileting: No Drive: No Dressing: No Hobbies: No Engineer, maintenance) Signed: 11/18/2020 5:07:29 PM By: Deon Pilling RN, BSN Entered By: Deon Pilling on 11/18/2020 14:34:00 -------------------------------------------------------------------------------- Patient/Caregiver Education Details Patient Name: Date of Service: [name] 10/11/2022andnbsp2:30 PM Medical Record Number: [id number] Patient Account Number: [id number] Date of Birth/Gender: Treating RN: Dec 18, 1955 (65 y.o. [name] Primary Care Physician: [name] Other Clinician: Referring Physician: Treating Physician/Extender: Jerl Santos in Treatment: 7 Education  Assessment Education Provided To: Patient Education Topics Provided Wound/Skin Impairment: Handouts: Skin Care Do's and Dont's Methods: Explain/Verbal Responses: Reinforcements needed Electronic Signature(s) Signed: 11/18/2020 5:07:29 PM By: Deon Pilling RN, BSN Entered By: Deon Pilling on 11/18/2020 14:42:31 -------------------------------------------------------------------------------- Wound Assessment Details Patient Name: Date of Service: [name] 11/18/2020 2:30 PM Medical Record Number: [id number] Patient Account Number: [id number] Date of Birth/Sex: Treating RN: 29-Dec-1955 (65 y.o. [name], [name] Primary Care Suhaan Perleberg: [name] Other Clinician: Referring Lamonte Hartt: Treating Lancer Thurner/Extender: Jerl Santos in Treatment: 7 Wound Status Wound Number: 1 Primary Infection - not elsewhere classified Etiology: Wound Location: Left, Medial Lower Leg Wound Healed - Epithelialized Wounding Event: Trauma Status: Date Acquired: 08/22/2020 Comorbid Cataracts, Middle ear problems, Asthma, Sleep Apnea, Weeks Of Treatment: 7 History: Hypertension, Type II Diabetes, Osteoarthritis, Confinement Clustered Wound: No Anxiety Photos Wound Measurements Length: (cm) Width: (cm) Depth: (cm) Area: (cm) Volume: (cm) 0 % Reduction in Area: 100% 0 %  Reduction in Volume: 100% 0 Epithelialization: Large (67-100%) 0 Tunneling: No 0 Undermining: No Wound Description Classification: Full Thickness Without Exposed Support Structures Wound Margin: Well defined, not attached Exudate Amount: None Present Foul Odor After Cleansing: No Slough/Fibrino No Wound Bed Granulation Amount: None Present (0%) Exposed Structure Necrotic Amount: None Present (0%) Fascia Exposed: No Fat Layer (Subcutaneous Tissue) Exposed: No Tendon Exposed: No Muscle Exposed: No Joint Exposed: No Bone Exposed: No Electronic Signature(s) Signed: 11/18/2020 5:07:29 PM By:  Deon Pilling RN, BSN Signed: 11/19/2020 1:46:44 PM By: Sandre Kitty Entered By: Sandre Kitty on 11/18/2020 14:41:43 -------------------------------------------------------------------------------- [place] Details Patient Name: Date of Service: [name] 11/18/2020 2:30 PM Medical Record Number: [id number] Patient Account Number: [id number] Date of Birth/Sex: Treating RN: [birth year] (65 y.o. [name], [name] Primary Care Shaterria Sager: [name] Other Clinician: Referring Lissie Hinesley: Treating Karli Wickizer/Extender: Jerl Santos in Treatment: 7 Vital Signs Time Taken: 14:30 Temperature (F): 98.3 Height (in): 60 Pulse (bpm): 74 Weight (lbs): 174 Respiratory Rate (breaths/min): 20 Body Mass Index (BMI): 34 Blood Pressure (mmHg): 131/84 Reference Range: 80 - 120 mg / dl Electronic Signature(s) Signed: 11/18/2020 5:07:29 PM By: Deon Pilling RN, BSN Entered By: Deon Pilling on 11/18/2020 14:33:49

## 2020-11-24 ENCOUNTER — Inpatient Hospital Stay (HOSPITAL_COMMUNITY): Admission: RE | Admit: 2020-11-24 | Payer: Medicare HMO | Source: Ambulatory Visit

## 2020-11-24 ENCOUNTER — Encounter (HOSPITAL_COMMUNITY): Payer: Self-pay

## 2020-11-30 ENCOUNTER — Other Ambulatory Visit: Payer: Self-pay | Admitting: Allergy & Immunology

## 2020-12-01 ENCOUNTER — Ambulatory Visit (HOSPITAL_COMMUNITY)
Admission: RE | Admit: 2020-12-01 | Discharge: 2020-12-01 | Disposition: A | Discharge status: Home / Self Care | Payer: Medicare HMO | Source: Ambulatory Visit | Attending: Gastroenterology | Admitting: Gastroenterology

## 2020-12-01 ENCOUNTER — Other Ambulatory Visit: Payer: Self-pay

## 2020-12-01 DIAGNOSIS — R490 Dysphonia: Secondary | ICD-10-CM | POA: Insufficient documentation

## 2020-12-01 DIAGNOSIS — Z9889 Other specified postprocedural states: Secondary | ICD-10-CM | POA: Diagnosis not present

## 2020-12-01 DIAGNOSIS — Z8719 Personal history of other diseases of the digestive system: Secondary | ICD-10-CM | POA: Insufficient documentation

## 2020-12-01 DIAGNOSIS — R143 Flatulence: Secondary | ICD-10-CM | POA: Diagnosis not present

## 2020-12-01 DIAGNOSIS — R110 Nausea: Secondary | ICD-10-CM | POA: Insufficient documentation

## 2020-12-01 DIAGNOSIS — R070 Pain in throat: Secondary | ICD-10-CM | POA: Diagnosis not present

## 2020-12-01 DIAGNOSIS — K5900 Constipation, unspecified: Secondary | ICD-10-CM | POA: Diagnosis not present

## 2020-12-09 ENCOUNTER — Other Ambulatory Visit: Payer: Self-pay | Admitting: Allergy & Immunology

## 2020-12-09 ENCOUNTER — Ambulatory Visit: Payer: Medicare HMO | Admitting: Allergy & Immunology

## 2020-12-09 ENCOUNTER — Other Ambulatory Visit: Payer: Self-pay

## 2020-12-09 VITALS — BP 110/74 | HR 88 | Temp 97.6°F | Resp 18 | Ht 60.0 in | Wt 173.0 lb

## 2020-12-09 DIAGNOSIS — J3089 Other allergic rhinitis: Secondary | ICD-10-CM

## 2020-12-09 DIAGNOSIS — J383 Other diseases of vocal cords: Secondary | ICD-10-CM

## 2020-12-09 DIAGNOSIS — J302 Other seasonal allergic rhinitis: Secondary | ICD-10-CM

## 2020-12-09 DIAGNOSIS — J4540 Moderate persistent asthma, uncomplicated: Secondary | ICD-10-CM

## 2020-12-09 MED ORDER — BUDESONIDE-FORMOTEROL FUMARATE 160-4.5 MCG/ACT IN AERO: 2.0000 | INHALATION_SPRAY | Freq: Two times a day (BID) | RESPIRATORY_TRACT | 5 refills | Status: DC

## 2020-12-09 NOTE — Progress Notes (Signed)
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 Moderate persistent asthma, uncomplicated - Spirometry looked good today. - We will not charge you for that. - [place] and Me form provided to get the Symbicort  for free or a reduced price.  - Daily controller medication(s): Singulair 10mg  daily and Symbicort 169mcg 2 puffs twice daily with spacer - Prior to physical activity: albuterol 2 puffs 10-15 minutes before physical activity. - Rescue medications: albuterol 4 puffs every 4-6 hours as needed - Changes during respiratory infections or worsening symptoms: Add on Flovent 241mcg to 4 puffs  2 times daily for TWO WEEKS. - Asthma control goals:  * Full participation in all desired activities (may need albuterol before activity) * Albuterol use two time or less a week on average (not counting use with activity) * Cough interfering with sleep two time or less a month * Oral steroids no more than once a year * No hospitalizations  2. Seasonal and perennial allergic rhinitis - Continue with Singulair 10mg  once daily. - Continue with carbinoxamine up to three times daily. - Continue with azelastine one spray per nostril up to TWICE as needed depending on your symptoms.  - Continue with Pataday eye drops twice daily as needed.  3. Return in about 6 months (around 06/08/2021).   Subjective:   [name] is a 65 y.o. 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   Right hand pain 04/11/2020   Bilateral leg edema 03/12/2020   Grade I diastolic dysfunction 28/31/5176   Type 2 diabetes mellitus without complication, without long-term current use of insulin ([place]) 03/12/2020   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   Iron deficiency anemia 04/28/2018   Ambulatory dysfunction 01/16/2018   Serotonin syndrome 01/16/2018   OA (osteoarthritis) of knee 12/26/2017   PVC (premature ventricular contraction) 11/21/2017   Dysphonia 09/26/2017   Abnormal liver function test 04/04/2017   Vitamin D deficiency, unspecified 10/13/2016   Hyperlipidemia 09/07/2016   Osteoporosis without current pathological fracture 03/22/2016   Paraesophageal hiatal hernia s/p robotic repair & fundoplication 1/60/7371 08/04/9483   Bipolar 1 disorder, mixed, moderate ([place]) 01/29/2016   Class 1 obesity with body mass index (BMI) of 32.0 to 32.9 in adult 01/06/2016   History of skin cancer 03/10/2015   Bilateral knee pain 12/06/2014   Migraines 11/21/2014   Increased frequency of urination 05/30/2013   Tremor 02/08/2012   OTH D/O MENSTRUATION&OTH ABN BLEED FE GNT TRACT 12/01/2007   OSA on CPAP 01/30/2007   GERD 11/02/2006   DERMATITIS, HANDS 11/02/2006   Hypothyroidism 10/07/2006   Bipolar affective ([place]) 10/07/2006   Essential hypertension 10/07/2006   RHINITIS, CHRONIC 10/07/2006   Seasonal and perennial allergic rhinitis 10/07/2006   Moderate persistent asthma, uncomplicated 46/27/0350   Irritable bowel syndrome 10/07/2006   INTERSTITIAL CYSTITIS 10/07/2006   ROSACEA 10/07/2006  Osteoarthritis of both knees 10/07/2006   Disturbance in sleep behavior 10/07/2006   Personal history of other malignant neoplasm of skin 10/07/2006    History obtained from: chart review and patient.  [name] is a 65 y.o. female presenting for a follow up visit. She was last seen in May 2022. At  that time, her lung testing looked good.  We stopped her Flovent and started on Symbicort because she was using her rescue inhaler more than she was in the past.  We continue with Singulair 10 mg daily.  For her rhinitis, we will continue with Singulair as well as carbinoxamine and Astelin.  We also continue with Pataday.  Since the last visit, she has done very well. She has done well for the most part, especially from an atopic perspective.  Asthma/Respiratory Symptom History: Asthma has been well controlled. Sometimes when she breathes she feels like she is making noise (meaning wheezing or a deep sigh). She has not needed to use her albuterol last time. She was changed from Flovent to Symbicort. She is going to have her copay reset in January 2023.   Allergic Rhinitis Symptom History: She has been sneezing more lately. She was using the Astelin only once.  This seems to be controlling her symptoms to a certain extent.  She does have the carbinoxamine on board as well.  She has not needed entirely biotics for sinus infections or pneumonia.  She had a cat bite which has required five different antibiotics. This was on her left anterior lower extremity.  It sounds like she had to have quite the debridement.  She was stepping over her cats at the time and also stepped on a cat still because the cat lashed out on her and bit her multiple times.  She ended up going to a wound doctor as well.  She has to wear compression stockings.  She did not need a wound VAC from what I can tell.  She is healing fairly well.  Otherwise, there have been no changes to her past medical history, surgical history, family history, or social history.    Review of Systems  Constitutional: Negative.  Negative for chills, fever, malaise/fatigue and weight loss.  HENT: Negative.  Negative for congestion, ear discharge and ear pain.        Positive for sneezing.  Eyes:  Negative for pain, discharge and redness.  Respiratory:   Negative for cough, sputum production, shortness of breath and wheezing.   Cardiovascular: Negative.  Negative for chest pain and palpitations.  Gastrointestinal:  Negative for abdominal pain, constipation, diarrhea, heartburn, nausea and vomiting.  Skin: Negative.  Negative for itching and rash.  Neurological:  Negative for dizziness and headaches.  Endo/Heme/Allergies:  Negative for environmental allergies. Does not bruise/bleed easily.      Objective:   Blood pressure 110/74, pulse 88, temperature 97.6 F (36.4 C), temperature source Temporal, resp. rate 18, height 5' (1.524 m), weight 173 lb (78.5 kg), last menstrual period 03/05/2012, SpO2 98 %. Body mass index is 33.79 kg/m.   Physical Exam:  Physical Exam Vitals reviewed.  Constitutional:      Appearance: She is well-developed.  HENT:     Head: Normocephalic and atraumatic.     Right Ear: Tympanic membrane, ear canal and external ear normal.     Left Ear: Tympanic membrane, ear canal and external ear normal.     Nose: No nasal deformity, septal deviation, mucosal edema or rhinorrhea.     Right Turbinates: Enlarged  and swollen.     Left Turbinates: Enlarged and swollen.     Right Sinus: No maxillary sinus tenderness or frontal sinus tenderness.     Left Sinus: No maxillary sinus tenderness or frontal sinus tenderness.     Mouth/Throat:     Mouth: Mucous membranes are not pale and not dry.     Pharynx: Uvula midline.  Eyes:     General: Lids are normal. No allergic shiner.       Right eye: No discharge.        Left eye: No discharge.     Conjunctiva/sclera: Conjunctivae normal.     Right eye: Right conjunctiva is not injected. No chemosis.    Left eye: Left conjunctiva is not injected. No chemosis.    Pupils: Pupils are equal, round, and reactive to light.  Cardiovascular:     Rate and Rhythm: Normal rate and regular rhythm.     Heart sounds: Normal heart sounds.  Pulmonary:     Effort: Pulmonary effort is normal.  No tachypnea, accessory muscle usage or respiratory distress.     Breath sounds: Normal breath sounds. No wheezing, rhonchi or rales.     Comments: Moving air well in all lung fields.  No increased work of breathing. Chest:     Chest wall: No tenderness.  Lymphadenopathy:     Cervical: No cervical adenopathy.  Skin:    General: Skin is warm.     Capillary Refill: Capillary refill takes less than 2 seconds.     Coloration: Skin is not pale.     Findings: No abrasion, erythema, petechiae or rash. Rash is not papular, urticarial or vesicular.     Comments: She does have the resolving lesion on her left lower extremity.  There is no erythema or discharge.  Neurological:     Mental Status: She is alert.  Psychiatric:        Behavior: Behavior is cooperative.     Diagnostic studies:    Spirometry: results normal (FEV1: 2.10/102%, FVC: 2.57/95%, FEV1/FVC: 82%).    Spirometry consistent with normal pattern.    Allergy Studies: none        Salvatore Marvel, MD  [facility]

## 2020-12-09 NOTE — Patient Instructions (Addendum)
1. Moderate persistent asthma, uncomplicated - Spirometry looked good today. - We will not charge you for that. - [place] and Me form provided to get the Symbicort  for free or a reduced price.  - Daily controller medication(s): Singulair 10mg  daily and Symbicort 123mcg 2 puffs twice daily with spacer - Prior to physical activity: albuterol 2 puffs 10-15 minutes before physical activity. - Rescue medications: albuterol 4 puffs every 4-6 hours as needed - Changes during respiratory infections or worsening symptoms: Add on Flovent 290mcg to 4 puffs  2 times daily for TWO WEEKS. - Asthma control goals:  * Full participation in all desired activities (may need albuterol before activity) * Albuterol use two time or less a week on average (not counting use with activity) * Cough interfering with sleep two time or less a month * Oral steroids no more than once a year * No hospitalizations  2. Seasonal and perennial allergic rhinitis - Continue with Singulair 10mg  once daily. - Continue with carbinoxamine up to three times daily. - Continue with azelastine one spray per nostril up to TWICE as needed depending on your symptoms.  - Continue with Pataday eye drops twice daily as needed.  3. Return in about 6 months (around 06/08/2021).    Please inform us of any Emergency Department visits, hospitalizations, or changes in symptoms. Call us before going to the ED for breathing or allergy symptoms since we might be able to fit you in for a sick visit. Feel free to contact us anytime with any questions, problems, or concerns.  It was a pleasure to see you again today! I hope you heal fully!   Websites that have reliable patient information: 1. American Academy of Asthma, Allergy, and Immunology: www.aaaai.org 2. Food Allergy Research and Education (FARE): foodallergy.org 3. Mothers of Asthmatics: http://www.asthmacommunitynetwork.org 4. American College of Allergy, Asthma, and Immunology:  www.acaai.org   COVID-19 Vaccine Information can be found at: ShippingScam.co.uk For questions related to vaccine distribution or appointments, please email vaccine@[email or web address] or call [phone].   We realize that you might be concerned about having an allergic reaction to the COVID19 vaccines. To help with that concern, WE ARE OFFERING THE COVID19 VACCINES IN OUR OFFICE! Ask the front desk for dates!     "Like" [place] on Facebook and Instagram for our latest updates!      A healthy democracy works best when [employer] participate! Make sure you are registered to vote! If you have moved or changed any of your contact information, you will need to get this updated before voting!  In some cases, you MAY be able to register to vote online: [email or web address]

## 2020-12-10 ENCOUNTER — Encounter: Payer: Self-pay | Admitting: Allergy & Immunology

## 2020-12-12 ENCOUNTER — Encounter: Payer: Self-pay | Admitting: Gastroenterology

## 2020-12-16 DIAGNOSIS — F423 Hoarding disorder: Secondary | ICD-10-CM | POA: Diagnosis not present

## 2020-12-16 DIAGNOSIS — R69 Illness, unspecified: Secondary | ICD-10-CM | POA: Diagnosis not present

## 2020-12-18 DIAGNOSIS — F3132 Bipolar disorder, current episode depressed, moderate: Secondary | ICD-10-CM | POA: Diagnosis not present

## 2020-12-18 DIAGNOSIS — F410 Panic disorder [episodic paroxysmal anxiety] without agoraphobia: Secondary | ICD-10-CM | POA: Diagnosis not present

## 2020-12-18 DIAGNOSIS — R69 Illness, unspecified: Secondary | ICD-10-CM | POA: Diagnosis not present

## 2020-12-18 DIAGNOSIS — F411 Generalized anxiety disorder: Secondary | ICD-10-CM | POA: Diagnosis not present

## 2020-12-22 ENCOUNTER — Ambulatory Visit (AMBULATORY_SURGERY_CENTER): Payer: Medicare HMO | Admitting: Gastroenterology

## 2020-12-22 ENCOUNTER — Encounter: Payer: Self-pay | Admitting: Gastroenterology

## 2020-12-22 ENCOUNTER — Other Ambulatory Visit: Payer: Self-pay

## 2020-12-22 VITALS — BP 117/73 | HR 76 | Temp 98.4°F | Resp 14 | Ht 60.0 in | Wt 178.0 lb

## 2020-12-22 DIAGNOSIS — K2970 Gastritis, unspecified, without bleeding: Secondary | ICD-10-CM

## 2020-12-22 DIAGNOSIS — K219 Gastro-esophageal reflux disease without esophagitis: Secondary | ICD-10-CM | POA: Diagnosis not present

## 2020-12-22 DIAGNOSIS — R110 Nausea: Secondary | ICD-10-CM

## 2020-12-22 DIAGNOSIS — K319 Disease of stomach and duodenum, unspecified: Secondary | ICD-10-CM

## 2020-12-22 MED ORDER — SODIUM CHLORIDE 0.9 % IV SOLN
500.0000 mL | Freq: Once | INTRAVENOUS | Status: DC
Start: 2020-12-22 — End: 2020-12-22

## 2020-12-22 NOTE — Patient Instructions (Signed)
Handout provided on gastritis.   Return to referring physician ([facility]) for re-evaluation of dysphonia, as it is not from GERD.   YOU HAD AN ENDOSCOPIC PROCEDURE TODAY AT [facility]:   Refer to the procedure report that was given to you for any specific questions about what was found during the examination.  If the procedure report does not answer your questions, please call your gastroenterologist to clarify.  If you requested that your care partner not be given the details of your procedure findings, then the procedure report has been included in a sealed envelope for you to review at your convenience later.  YOU SHOULD EXPECT: Some feelings of bloating in the abdomen. Passage of more gas than usual.  Walking can help get rid of the air that was put into your GI tract during the procedure and reduce the bloating. If you had a lower endoscopy (such as a colonoscopy or flexible sigmoidoscopy) you may notice spotting of blood in your stool or on the toilet paper. If you underwent a bowel prep for your procedure, you may not have a normal bowel movement for a few days.  Please Note:  You might notice some irritation and congestion in your nose or some drainage.  This is from the oxygen used during your procedure.  There is no need for concern and it should clear up in a day or so.  SYMPTOMS TO REPORT IMMEDIATELY:  Following upper endoscopy (EGD)  Vomiting of blood or coffee ground material  New chest pain or pain under the shoulder blades  Painful or persistently difficult swallowing  New shortness of breath  Fever of 100F or higher  Black, tarry-looking stools  For urgent or emergent issues, a gastroenterologist can be reached at any hour by calling [phone]. Do not use MyChart messaging for urgent concerns.    DIET:  We do recommend a small meal at first, but then you may proceed to your regular diet.  Drink plenty of fluids but you should avoid alcoholic beverages  for 24 hours.  ACTIVITY:  You should plan to take it easy for the rest of today and you should NOT DRIVE or use heavy machinery until tomorrow (because of the sedation medicines used during the test).    FOLLOW UP: Our staff will call the number listed on your records 48-72 hours following your procedure to check on you and address any questions or concerns that you may have regarding the information given to you following your procedure. If we do not reach you, we will leave a message.  We will attempt to reach you two times.  During this call, we will ask if you have developed any symptoms of COVID 19. If you develop any symptoms (ie: fever, flu-like symptoms, shortness of breath, cough etc.) before then, please call [phone].  If you test positive for Covid 19 in the 2 weeks post procedure, please call and report this information to [place].    If any biopsies were taken you will be contacted by phone or by letter within the next 1-3 weeks.  Please call us at [phone] if you have not heard about the biopsies in 3 weeks.    SIGNATURES/CONFIDENTIALITY: You and/or your care partner have signed paperwork which will be entered into your electronic medical record.  These signatures attest to the fact that that the information above on your After Visit Summary has been reviewed and is understood.  Full responsibility of the confidentiality of this  discharge information lies with you and/or your care-partner.

## 2020-12-22 NOTE — Progress Notes (Signed)
Called to room to assist during endoscopic procedure.  Patient ID and intended procedure confirmed with present staff. Received instructions for my participation in the procedure from the performing physician.  

## 2020-12-22 NOTE — Progress Notes (Signed)
Pt's states no medical or surgical changes since previsit or office visit. 

## 2020-12-22 NOTE — Progress Notes (Signed)
History and Physical:  This patient presents for endoscopic testing for: Encounter Diagnosis  Name Primary?   Nausea Yes    Nausea. Clinical details in 11/14/20 office note  ROS: Patient denies chest pain or cough   Past Medical History: 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   Hypothyroidism    Intermittent palpitations    cardiology--  dr [place]   Interstitial cystitis    Limited jaw range of motion    s/p  bilateral TMJ surgery,  age 38s   Migraines    Moderate persistent asthma    pulmologist-- dr Halford Chessman   OA (osteoarthritis)    both knees   OSA on CPAP    per last sleep study 09/ 2017  mild OSA, AHI13/hr   PONV (postoperative nausea and vomiting)    PVC (premature ventricular contraction)    Rosacea    Sensation of pressure in bladder area    per pt intermittant   TMJ arthralgia    Urticaria    Wears glasses    White coat syndrome without hypertension    05-25-2017  per pt hx hypertension yrs ago, no issues after quitting stressful job     Past Surgical History: Past Surgical History:  Procedure Laterality Date   80 HOUR [place] STUDY N/A 09/22/2015   Procedure: 24 HOUR [place] STUDY;  Surgeon: Doran Stabler, MD;  Location: [facility];  Service: Gastroenterology;  Laterality: N/A;   ADENOIDECTOMY     ANAL FISSURE REPAIR  age 65  (64)   BREAST EXCISIONAL BIOPSY Right 04-16-2003  dr p. young  [facility]   benign   BUNIONECTOMY Right early 2000s   CARPAL  TUNNEL RELEASE Left age 61 (42)   CHOLECYSTECTOMY OPEN  age 39   CYSTO WITH HYDRODISTENSION N/A 06/02/2017   Procedure: CYSTOSCOPY/HYDRODISTENSION OF BLADDER;  Surgeon: Irine Seal, MD;  Location: [facility];  Service: Urology;  Laterality: N/A;   CYSTO/ HYDRODISTENTION/  INSTILLATION THERAPY  1990s   DX LAPAROSCOPY/  DX HYSTEROSCOPY/  D & C  08-07-2007   dr dove  [place]  10-21-2008   dr Harolyn Rutherford  [facility]   ESOPHAGEAL MANOMETRY N/A 09/22/2015   Procedure: ESOPHAGEAL MANOMETRY (EM);  Surgeon: Doran Stabler, MD;  Location: [facility];  Service: Gastroenterology;  Laterality: N/A;  with impedence    FINGER ARTHROPLASTY Bilateral    left little finger 2016:  right middle finger 06/ 2018   KNEE ARTHROSCOPY Bilateral x3 left ;  x3  right-- last one age 54 (18)   NASAL SEPTUM SURGERY  age 25s   ROBOT ASSISTED REDUCTION PARAESOPHAGEAL HIATAL HERNIA/ TYPE 2 MEDIASTINAL DISSECTION/ PRIMARY HIATAL HERNIA REPAIR/ ANTERIOR & POSTERIOR GASTROPEXY/ NISSEN FUNDOPLICATION  [id number]   DR GROSS  [facility]   SINOSCOPY  TEMPOROMANDIBULAR JOINT SURGERY Bilateral x3  -- age 75s   per pt used graft   TONSILLECTOMY     TOTAL KNEE ARTHROPLASTY Left 12/26/2017   Procedure: LEFT TOTAL KNEE ARTHROPLASTY;  Surgeon: Gaynelle Arabian, MD;  Location: [facility];  Service: Orthopedics;  Laterality: Left;  54min   TRANSTHORACIC ECHOCARDIOGRAM  12-06-2017   dr [place]   ef 55-60%, grade 1 diastoilc dysfunction, trivial MR   TRIGGER FINGER RELEASE Bilateral last one 2017   several release's bilaterally   ULNAR NERVE TRANSPOSITION Bilateral age 3 (1980)    Allergies: Allergies  Allergen Reactions   Emetrol Itching   Indomethacin Other (See Comments)    Muscle spasms Causes muscle spasms in neck   Iodinated Diagnostic Agents Itching    Flushed and Fever, itch all over   Amlodipine Swelling    Peripheral edema   Carbamazepine Other (See Comments)    Easy and unusual  bleeding    Pseudoephedrine Other (See Comments)    I fly off the walls  CANNOT SLEEP   Risperidone And Related Other (See Comments)    Stomach upset, insomnia, drooling, tremors, "jerks," sensitivity to touch.    Bupivacaine Hives   Pentazocine Other (See Comments)    Unknown reaction MADE ME "LACTATE"   Propoxyphene Itching and Nausea And Vomiting    darvocet   Sulfa Antibiotics Other (See Comments)    Unknown reaction   Tramadol Other (See Comments)    Patient can not remember   Aspirin Other (See Comments)    upset stomach, ringing in the ears   Codeine Itching and Other (See Comments)    Anything related to codeine   Etodolac Rash and Other (See Comments)   Propranolol Nausea Only and Other (See Comments)    Outpatient Meds: Current Outpatient Medications  Medication Sig Dispense Refill   acetaminophen (TYLENOL) 500 MG tablet Take 500 mg by mouth every 6 (six) hours as needed.     budesonide-formoterol (SYMBICORT) 160-4.5 MCG/ACT inhaler Inhale 2 puffs into the lungs in the morning and at bedtime. 3 each 5   Carbinoxamine Maleate 4 MG TABS Take 1 tablet (4 mg total) by mouth every 6 (six) hours. 180 tablet 1   Cinnamon 500 MG capsule Cinnamon 500 mg capsule     clonazePAM (KLONOPIN) 0.5 MG tablet Take 0.5 mg by mouth 2 (two) times daily as needed.     famotidine (PEPCID) 20 MG tablet Take 20 mg by mouth at bedtime.     fluticasone (FLONASE) 50 MCG/ACT nasal spray PLACE 1 SPRAY INTO BOTH NOSTRILS 2 (TWO) TIMES DAILY AS NEEDED FOR ALLERGIES OR RHINITIS. 48 mL 1   hydrocortisone 2.5 % ointment Apply topically 2 (two) times daily.     levothyroxine (SYNTHROID) 50 MCG tablet Take 1 tablet (50 mcg total) by mouth daily before breakfast. 90 tablet 0   metoprolol tartrate (LOPRESSOR) 25 MG tablet TAKE 1 TABLET BY MOUTH TWICE A DAY 180 tablet 1   montelukast (SINGULAIR) 10 MG tablet TAKE 1 TABLET BY MOUTH EVERYDAY AT BEDTIME 90 tablet 0   MYRBETRIQ 50 MG TB24 tablet Take 50 mg by  mouth daily.     rosuvastatin (CRESTOR) 20 MG tablet Take 1 tablet (20 mg total) by mouth daily. 90 tablet 1   sertraline (ZOLOFT) 100 MG tablet Take 200 mg by mouth at bedtime.      sodium chloride (OCEAN) 0.65 % SOLN nasal spray Place 1 spray into both nostrils as needed for congestion.  torsemide (DEMADEX) 20 MG tablet Take 1 tablet (20 mg total) by mouth daily. 90 tablet 1   trihexyphenidyl (ARTANE) 2 MG tablet Take 2 mg by mouth daily.     zinc gluconate 50 MG tablet Take 50 mg by mouth daily.     albuterol (VENTOLIN HFA) 108 (90 Base) MCG/ACT inhaler INHALE 1-2 PUFFS INTO THE LUNGS DAILY AS NEEDED FOR WHEEZING OR SHORTNESS OF BREATH. 18 each 1   Ascorbic Acid (VITAMIN C CR) 500 MG CPCR Take by mouth.     Azelastine HCl 0.15 % SOLN Place 1-2 sprays into both nostrils 2 (two) times daily. 12 mL 0   Brexpiprazole (REXULTI) 3 MG TABS Take by mouth.     fluticasone (FLOVENT HFA) 220 MCG/ACT inhaler INHALE 1-2 PUFFS INTO THE LUNGS IN THE MORNING AND AT BEDTIME. 1 each 2   ketoconazole (NIZORAL) 2 % cream Apply topically daily.      metroNIDAZOLE (METROCREAM) 0.75 % cream Apply 1 application topically 2 (two) times daily.     NUZYRA 150 MG TABS Take by mouth.     omeprazole (PRILOSEC) 20 MG capsule omeprazole 20 mg capsule,delayed release     promethazine (PHENERGAN) 25 MG tablet Take 1 tablet (25 mg total) by mouth every 6 (six) hours as needed for nausea or vomiting. 45 tablet 2   rizatriptan (MAXALT-MLT) 10 MG disintegrating tablet TAKE 1 TABLET EVERY DAY AS NEEDED FOR MIGRAINE, MAY REPEAT IN 2 HRS IF NEEDED. MAX 2 DOSES/WK 9 tablet 2   Current Facility-Administered Medications  Medication Dose Route Frequency Provider Last Rate Last Admin   0.9 %  sodium chloride infusion  500 mL Intravenous Once Nelida Meuse III, MD          ___________________________________________________________________ Objective   Exam:  BP 116/75   Pulse 88   Temp 98.4 F (36.9 C)   Ht 5' (1.524 m)    Wt 178 lb (80.7 kg)   LMP 03/05/2012 (Approximate)   SpO2 100%   BMI 34.76 kg/m   CV: RRR without murmur, S1/S2 Resp: clear to auscultation bilaterally, normal RR and effort noted GI: soft, no tenderness, with active bowel sounds.   Assessment: Encounter Diagnosis  Name Primary?   Nausea Yes     Plan: EGD  The benefits and risks of the planned procedure were described in detail with the patient or (when appropriate) their health care proxy.  Risks were outlined as including, but not limited to, bleeding, infection, perforation, adverse medication reaction leading to cardiac or pulmonary decompensation, pancreatitis (if ERCP).  The limitation of incomplete mucosal visualization was also discussed.  No guarantees or warranties were given.    The patient is appropriate for an endoscopic procedure in the ambulatory setting.   - Wilfrid Lund, MD

## 2020-12-22 NOTE — Op Note (Signed)
[facility] Patient Name: [name] Procedure Date: 12/22/2020 2:59 PM MRN: [id number] Endoscopist: Morven. Loletha Carrow , MD Age: 65 Referring MD:  Date of Birth: 01-14-1956 Gender: Female Account #: [id number] Procedure:                Upper GI endoscopy Indications:              Nausea                           Also dysphonia - referred by ENT for question of                            GERD as cause.                           recent office note with clinical details. Prior                            fundoplication aand no haertburn or regurgitation.                            recent UGIS without reflux elicited                           H pylori negative on 2016 Bx Medicines:                Monitored Anesthesia Care Procedure:                Pre-Anesthesia Assessment:                           - Prior to the procedure, a History and Physical                            was performed, and patient medications and                            allergies were reviewed. The patient's tolerance of                            previous anesthesia was also reviewed. The risks                            and benefits of the procedure and the sedation                            options and risks were discussed with the patient.                            All questions were answered, and informed consent                            was obtained. Prior Anticoagulants: The patient has  taken no previous anticoagulant or antiplatelet                            agents. ASA Grade Assessment: III - A patient with                            severe systemic disease. After reviewing the risks                            and benefits, the patient was deemed in                            satisfactory condition to undergo the procedure.                           After obtaining informed consent, the endoscope was                            passed under direct vision. Throughout  the                            procedure, the patient's blood pressure, pulse, and                            oxygen saturations were monitored continuously. The                            GIF [id number] #[id number] was introduced through the                            mouth, and advanced to the second part of duodenum.                            The upper GI endoscopy was accomplished without                            difficulty. The patient tolerated the procedure                            well. Scope In: Scope Out: Findings:                 The examined esophagus was normal.                           Evidence of a fundoplication was found in the                            cardia and in the gastric fundus. The wrap appeared                            intact. This was traversed.                           Diffuse atrophic mucosa was found  in the entire                            examined stomach.                           Striped mildly erythematous mucosa with bile                            staining was found in the gastric body and in the                            gastric antrum. Several biopsies were obtained on                            the greater curvature of the gastric body, on the                            lesser curvature of the gastric body, on the                            greater curvature of the gastric antrum and on the                            lesser curvature of the gastric antrum with cold                            forceps for histology (to rule out H pylori).                           The cardia and gastric fundus were normal on                            retroflexion.                           The examined duodenum was normal. Complications:            No immediate complications. Estimated Blood Loss:     Estimated blood loss was minimal. Impression:               - Normal esophagus.                           - A fundoplication was found. The wrap appears                             intact.                           - Gastric mucosal atrophy.                           - Erythematous mucosa in the gastric body and  antrum. Mild,nonspecific finding that may be from                            bile stasis and of doubtful clinical significance.                           - Normal examined duodenum.                           - Several biopsies were obtained on the greater                            curvature of the gastric body, on the lesser                            curvature of the gastric body, on the greater                            curvature of the gastric antrum and on the lesser                            curvature of the gastric antrum. Recommendation:           - Patient has a contact number available for                            emergencies. The signs and symptoms of potential                            delayed complications were discussed with the                            patient. Return to normal activities tomorrow.                            Written discharge instructions were provided to the                            patient.                           - Resume previous diet.                           - Continue present medications.                           - Await pathology results.                           - Return to referring physician ([facility]) for                            re-evaluation of dysphonia, as it is not from GERD. [name] L. Loletha Carrow, MD 12/22/2020 3:41:35 PM This report has been signed electronically.

## 2020-12-22 NOTE — Progress Notes (Signed)
Vss nad pt transferred

## 2020-12-22 NOTE — Progress Notes (Signed)
D.T. vital signs. °

## 2020-12-24 ENCOUNTER — Telehealth: Payer: Self-pay

## 2020-12-24 NOTE — Telephone Encounter (Signed)
  Follow up Call-  Call back number 12/22/2020  Post procedure Call Back phone  # [phone]  Permission to leave phone message Yes  Some recent data might be hidden     Patient questions:  Do you have a fever, pain , or abdominal swelling? No. Pain Score  0 *  Have you tolerated food without any problems? Yes.    Have you been able to return to your normal activities? Yes.    Do you have any questions about your discharge instructions: Diet   No. Medications  No. Follow up visit  No.  Do you have questions or concerns about your Care? No.  Actions: * If pain score is 4 or above: No action needed, pain <4.  Have you developed a fever since your procedure? no  2.   Have you had an respiratory symptoms (SOB or cough) since your procedure? no  3.   Have you tested positive for COVID 19 since your procedure no  4.   Have you had any family members/close contacts diagnosed with the COVID 19 since your procedure?  no   If yes to any of these questions please route to Joylene John, RN and Joella Prince, RN

## 2020-12-26 ENCOUNTER — Encounter: Payer: Self-pay | Admitting: Gastroenterology

## 2021-01-06 ENCOUNTER — Other Ambulatory Visit: Payer: Self-pay | Admitting: Internal Medicine

## 2021-01-06 DIAGNOSIS — G4733 Obstructive sleep apnea (adult) (pediatric): Secondary | ICD-10-CM | POA: Diagnosis not present

## 2021-01-06 DIAGNOSIS — I10 Essential (primary) hypertension: Secondary | ICD-10-CM

## 2021-01-06 DIAGNOSIS — I493 Ventricular premature depolarization: Secondary | ICD-10-CM

## 2021-01-07 ENCOUNTER — Other Ambulatory Visit: Payer: Self-pay | Admitting: Allergy & Immunology

## 2021-01-07 DIAGNOSIS — L821 Other seborrheic keratosis: Secondary | ICD-10-CM | POA: Diagnosis not present

## 2021-01-07 DIAGNOSIS — Z23 Encounter for immunization: Secondary | ICD-10-CM | POA: Diagnosis not present

## 2021-01-07 DIAGNOSIS — L814 Other melanin hyperpigmentation: Secondary | ICD-10-CM | POA: Diagnosis not present

## 2021-01-07 DIAGNOSIS — D1801 Hemangioma of skin and subcutaneous tissue: Secondary | ICD-10-CM | POA: Diagnosis not present

## 2021-01-07 DIAGNOSIS — L219 Seborrheic dermatitis, unspecified: Secondary | ICD-10-CM | POA: Diagnosis not present

## 2021-01-07 DIAGNOSIS — Z85828 Personal history of other malignant neoplasm of skin: Secondary | ICD-10-CM | POA: Diagnosis not present

## 2021-01-07 DIAGNOSIS — B356 Tinea cruris: Secondary | ICD-10-CM | POA: Diagnosis not present

## 2021-01-07 DIAGNOSIS — L578 Other skin changes due to chronic exposure to nonionizing radiation: Secondary | ICD-10-CM | POA: Diagnosis not present

## 2021-01-07 DIAGNOSIS — D229 Melanocytic nevi, unspecified: Secondary | ICD-10-CM | POA: Diagnosis not present

## 2021-01-07 DIAGNOSIS — Z86018 Personal history of other benign neoplasm: Secondary | ICD-10-CM | POA: Diagnosis not present

## 2021-01-13 DIAGNOSIS — F411 Generalized anxiety disorder: Secondary | ICD-10-CM | POA: Diagnosis not present

## 2021-01-13 DIAGNOSIS — F410 Panic disorder [episodic paroxysmal anxiety] without agoraphobia: Secondary | ICD-10-CM | POA: Diagnosis not present

## 2021-01-13 DIAGNOSIS — R69 Illness, unspecified: Secondary | ICD-10-CM | POA: Diagnosis not present

## 2021-01-13 DIAGNOSIS — F3132 Bipolar disorder, current episode depressed, moderate: Secondary | ICD-10-CM | POA: Diagnosis not present

## 2021-01-15 DIAGNOSIS — F411 Generalized anxiety disorder: Secondary | ICD-10-CM | POA: Diagnosis not present

## 2021-01-15 DIAGNOSIS — R69 Illness, unspecified: Secondary | ICD-10-CM | POA: Diagnosis not present

## 2021-01-20 ENCOUNTER — Other Ambulatory Visit: Payer: Self-pay | Admitting: Allergy & Immunology

## 2021-01-23 ENCOUNTER — Other Ambulatory Visit: Payer: Self-pay | Admitting: Internal Medicine

## 2021-01-23 DIAGNOSIS — E039 Hypothyroidism, unspecified: Secondary | ICD-10-CM

## 2021-01-25 ENCOUNTER — Other Ambulatory Visit: Payer: Self-pay | Admitting: Family Medicine

## 2021-01-25 DIAGNOSIS — G43809 Other migraine, not intractable, without status migrainosus: Secondary | ICD-10-CM

## 2021-02-02 ENCOUNTER — Other Ambulatory Visit: Payer: Self-pay | Admitting: Allergy & Immunology

## 2021-02-05 DIAGNOSIS — G4733 Obstructive sleep apnea (adult) (pediatric): Secondary | ICD-10-CM | POA: Diagnosis not present

## 2021-02-16 DIAGNOSIS — F410 Panic disorder [episodic paroxysmal anxiety] without agoraphobia: Secondary | ICD-10-CM | POA: Diagnosis not present

## 2021-02-16 DIAGNOSIS — R69 Illness, unspecified: Secondary | ICD-10-CM | POA: Diagnosis not present

## 2021-02-16 DIAGNOSIS — F411 Generalized anxiety disorder: Secondary | ICD-10-CM | POA: Diagnosis not present

## 2021-02-16 DIAGNOSIS — F3132 Bipolar disorder, current episode depressed, moderate: Secondary | ICD-10-CM | POA: Diagnosis not present

## 2021-02-26 ENCOUNTER — Other Ambulatory Visit: Payer: Self-pay | Admitting: Internal Medicine

## 2021-02-26 DIAGNOSIS — E039 Hypothyroidism, unspecified: Secondary | ICD-10-CM

## 2021-03-04 ENCOUNTER — Other Ambulatory Visit: Payer: Self-pay | Admitting: Allergy & Immunology

## 2021-03-05 DIAGNOSIS — F422 Mixed obsessional thoughts and acts: Secondary | ICD-10-CM | POA: Diagnosis not present

## 2021-03-05 DIAGNOSIS — R69 Illness, unspecified: Secondary | ICD-10-CM | POA: Diagnosis not present

## 2021-03-08 DIAGNOSIS — G4733 Obstructive sleep apnea (adult) (pediatric): Secondary | ICD-10-CM | POA: Diagnosis not present

## 2021-03-09 ENCOUNTER — Ambulatory Visit (INDEPENDENT_AMBULATORY_CARE_PROVIDER_SITE_OTHER): Payer: Medicare HMO | Admitting: Internal Medicine

## 2021-03-09 ENCOUNTER — Encounter: Payer: Self-pay | Admitting: Internal Medicine

## 2021-03-09 ENCOUNTER — Other Ambulatory Visit: Payer: Self-pay

## 2021-03-09 ENCOUNTER — Telehealth: Payer: Self-pay

## 2021-03-09 VITALS — BP 126/84 | HR 91 | Temp 99.0°F | Ht 60.0 in | Wt 163.0 lb

## 2021-03-09 DIAGNOSIS — E559 Vitamin D deficiency, unspecified: Secondary | ICD-10-CM

## 2021-03-09 DIAGNOSIS — I5189 Other ill-defined heart diseases: Secondary | ICD-10-CM | POA: Diagnosis not present

## 2021-03-09 DIAGNOSIS — I10 Essential (primary) hypertension: Secondary | ICD-10-CM

## 2021-03-09 DIAGNOSIS — E039 Hypothyroidism, unspecified: Secondary | ICD-10-CM

## 2021-03-09 DIAGNOSIS — Z1231 Encounter for screening mammogram for malignant neoplasm of breast: Secondary | ICD-10-CM

## 2021-03-09 DIAGNOSIS — Z0001 Encounter for general adult medical examination with abnormal findings: Secondary | ICD-10-CM

## 2021-03-09 DIAGNOSIS — E2839 Other primary ovarian failure: Secondary | ICD-10-CM | POA: Diagnosis not present

## 2021-03-09 DIAGNOSIS — R2231 Localized swelling, mass and lump, right upper limb: Secondary | ICD-10-CM

## 2021-03-09 DIAGNOSIS — E119 Type 2 diabetes mellitus without complications: Secondary | ICD-10-CM | POA: Diagnosis not present

## 2021-03-09 DIAGNOSIS — Z23 Encounter for immunization: Secondary | ICD-10-CM

## 2021-03-09 DIAGNOSIS — Z124 Encounter for screening for malignant neoplasm of cervix: Secondary | ICD-10-CM

## 2021-03-09 DIAGNOSIS — E785 Hyperlipidemia, unspecified: Secondary | ICD-10-CM

## 2021-03-09 DIAGNOSIS — E876 Hypokalemia: Secondary | ICD-10-CM

## 2021-03-09 LAB — TSH: TSH: 0.34 u[IU]/mL — ABNORMAL LOW (ref 0.35–5.50)

## 2021-03-09 LAB — BASIC METABOLIC PANEL
BUN: 13 mg/dL (ref 6–23)
CO2: 33 mEq/L — ABNORMAL HIGH (ref 19–32)
Calcium: 9.3 mg/dL (ref 8.4–10.5)
Chloride: 96 mEq/L (ref 96–112)
Creatinine, Ser: 1.16 mg/dL (ref 0.40–1.20)
GFR: 49.89 mL/min — ABNORMAL LOW (ref >60.00)
Glucose, Bld: 140 mg/dL — ABNORMAL HIGH (ref 70–99)
Potassium: 2.6 mEq/L — CL (ref 3.5–5.1)
Sodium: 143 mEq/L (ref 135–145)

## 2021-03-09 LAB — CBC WITH DIFFERENTIAL/PLATELET
Basophils Absolute: 0 10*3/uL (ref 0.0–0.1)
Basophils Relative: 0.6 % (ref 0.0–3.0)
Eosinophils Absolute: 0.1 10*3/uL (ref 0.0–0.7)
Eosinophils Relative: 1.1 % (ref 0.0–5.0)
HCT: 42.4 % (ref 36.0–46.0)
Hemoglobin: 14.6 g/dL (ref 12.0–15.0)
Lymphocytes Relative: 27.1 % (ref 12.0–46.0)
Lymphs Abs: 2.4 10*3/uL (ref 0.7–4.0)
MCHC: 34.3 g/dL (ref 30.0–36.0)
MCV: 85.5 fl (ref 78.0–100.0)
Monocytes Absolute: 0.8 10*3/uL (ref 0.1–1.0)
Monocytes Relative: 9 % (ref 3.0–12.0)
Neutro Abs: 5.5 10*3/uL (ref 1.4–7.7)
Neutrophils Relative %: 62.2 % (ref 43.0–77.0)
Platelets: 227 10*3/uL (ref 150.0–400.0)
RBC: 4.96 Mil/uL (ref 3.87–5.11)
RDW: 13.4 % (ref 11.5–15.5)
WBC: 8.9 10*3/uL (ref 4.0–10.5)

## 2021-03-09 LAB — HEPATIC FUNCTION PANEL
ALT: 16 U/L (ref 0–35)
AST: 22 U/L (ref 0–37)
Albumin: 4.4 g/dL (ref 3.5–5.2)
Alkaline Phosphatase: 119 U/L — ABNORMAL HIGH (ref 39–117)
Bilirubin, Direct: 0.2 mg/dL (ref 0.0–0.3)
Total Bilirubin: 0.9 mg/dL (ref 0.2–1.2)
Total Protein: 7.7 g/dL (ref 6.0–8.3)

## 2021-03-09 LAB — LIPID PANEL
Cholesterol: 258 mg/dL — ABNORMAL HIGH (ref 0–200)
HDL: 71.9 mg/dL (ref >39.00)
LDL Cholesterol: 163 mg/dL — ABNORMAL HIGH (ref 0–99)
NonHDL: 186.58
Total CHOL/HDL Ratio: 4
Triglycerides: 116 mg/dL (ref 0.0–149.0)
VLDL: 23.2 mg/dL (ref 0.0–40.0)

## 2021-03-09 LAB — HEMOGLOBIN A1C: Hgb A1c MFr Bld: 6.5 % (ref 4.6–6.5)

## 2021-03-09 LAB — VITAMIN D 25 HYDROXY (VIT D DEFICIENCY, FRACTURES): VITD: 41.43 ng/mL (ref 30.00–100.00)

## 2021-03-09 MED ORDER — LEVOTHYROXINE SODIUM 25 MCG PO TABS: 25.0000 ug | ORAL_TABLET | Freq: Every day | ORAL | 0 refills | Status: DC

## 2021-03-09 MED ORDER — SPIRONOLACTONE 25 MG PO TABS: 25.0000 mg | ORAL_TABLET | Freq: Every day | ORAL | 0 refills | Status: DC

## 2021-03-09 NOTE — Progress Notes (Signed)
Subjective:  Patient ID: [name], female    DOB: [birth year]  Age: 66 y.o. MRN: [id number]  CC: Annual Exam, Hypertension, Diabetes, and Hyperlipidemia  This visit occurred during the SARS-CoV-2 public health emergency.  Safety protocols were in place, including screening questions prior to the visit, additional usage of staff PPE, and extensive cleaning of exam room while observing appropriate contact time as indicated for disinfecting solutions.    HPI [name] presents for a CPX and f/up -   She has noticed a mass over her right, proximal, dorsal forearm for the last few months.  It does not bother her.  She denies chest pain, shortness of breath, diaphoresis, dizziness, lightheadedness, or edema.  Outpatient Medications Prior to Visit  Medication Sig Dispense Refill   acetaminophen (TYLENOL) 500 MG tablet Take 500 mg by mouth every 6 (six) hours as needed.     albuterol (VENTOLIN HFA) 108 (90 Base) MCG/ACT inhaler INHALE 1-2 PUFFS INTO THE LUNGS DAILY AS NEEDED FOR WHEEZING OR SHORTNESS OF BREATH. 18 each 1   Ascorbic Acid (VITAMIN C CR) 500 MG CPCR Take by mouth.     budesonide-formoterol (SYMBICORT) 160-4.5 MCG/ACT inhaler Inhale 2 puffs into the lungs in the morning and at bedtime. 3 each 5   Carbinoxamine Maleate 4 MG TABS TAKE 1 TABLET BY MOUTH EVERY 6 HOURS. 180 tablet 1   Cinnamon 500 MG capsule Cinnamon 500 mg capsule     clonazePAM (KLONOPIN) 0.5 MG tablet Take 0.5 mg by mouth 2 (two) times daily as needed.     fluticasone (FLONASE) 50 MCG/ACT nasal spray PLACE 1 SPRAY INTO BOTH NOSTRILS 2 (TWO) TIMES DAILY AS NEEDED FOR ALLERGIES OR RHINITIS. 48 mL 1   fluticasone (FLOVENT HFA) 220 MCG/ACT inhaler During respiratory infections or worsening symptoms Inhale 4 puffs 2 times daily for up to TWO WEEKS. 12 each 2   hydrocortisone 2.5 % ointment Apply topically 2 (two) times daily.     ketoconazole (NIZORAL) 2 % cream Apply topically daily.      metoprolol tartrate  (LOPRESSOR) 25 MG tablet TAKE 1 TABLET BY MOUTH TWICE A DAY 180 tablet 0   montelukast (SINGULAIR) 10 MG tablet TAKE 1 TABLET BY MOUTH EVERYDAY AT BEDTIME 90 tablet 1   MYRBETRIQ 50 MG TB24 tablet Take 50 mg by mouth daily.     omeprazole (PRILOSEC) 20 MG capsule omeprazole 20 mg capsule,delayed release     promethazine (PHENERGAN) 25 MG tablet Take 1 tablet (25 mg total) by mouth every 6 (six) hours as needed for nausea or vomiting. 45 tablet 2   rizatriptan (MAXALT-MLT) 10 MG disintegrating tablet TAKE 1 TABLET EVERY DAY AS NEEDED FOR MIGRAINE, MAY REPEAT IN 2 HRS IF NEEDED. MAX 2 DOSES/WK 9 tablet 2   rosuvastatin (CRESTOR) 20 MG tablet Take 1 tablet (20 mg total) by mouth daily. 90 tablet 1   sodium chloride (OCEAN) 0.65 % SOLN nasal spray Place 1 spray into both nostrils as needed for congestion.     trihexyphenidyl (ARTANE) 2 MG tablet Take 2 mg by mouth daily.     zinc gluconate 50 MG tablet Take 50 mg by mouth daily.     levothyroxine (SYNTHROID) 50 MCG tablet TAKE 1 TABLET BY MOUTH DAILY BEFORE BREAKFAST 30 tablet 0   torsemide (DEMADEX) 20 MG tablet Take 1 tablet (20 mg total) by mouth daily. 90 tablet 1   Azelastine HCl 0.15 % SOLN Place 1-2 sprays into both nostrils 2 (two) times  daily. 12 mL 0   Brexpiprazole (REXULTI) 3 MG TABS Take by mouth.     famotidine (PEPCID) 20 MG tablet Take 20 mg by mouth at bedtime.     metroNIDAZOLE (METROCREAM) 0.75 % cream Apply 1 application topically 2 (two) times daily.     NUZYRA 150 MG TABS Take by mouth.     sertraline (ZOLOFT) 100 MG tablet Take 200 mg by mouth at bedtime.      No facility-administered medications prior to visit.    ROS Review of Systems  Constitutional:  Negative for chills, diaphoresis, fatigue and fever.  HENT: Negative.    Eyes: Negative.   Respiratory:  Negative for cough, chest tightness, shortness of breath and wheezing.   Cardiovascular:  Negative for chest pain, palpitations and leg swelling.  Gastrointestinal:   Negative for abdominal pain, constipation, diarrhea, nausea and vomiting.  Endocrine: Negative.   Genitourinary: Negative.  Negative for difficulty urinating.  Musculoskeletal:  Negative for myalgias.  Skin: Negative.   Neurological:  Negative for dizziness, weakness and light-headedness.  Hematological:  Negative for adenopathy. Does not bruise/bleed easily.  Psychiatric/Behavioral: Negative.     Objective:  BP 126/84 (BP Location: Left Arm, Patient Position: Sitting, Cuff Size: Large)    Pulse 91    Temp 99 F (37.2 C) (Oral)    Ht 5' (1.524 m)    Wt 163 lb (73.9 kg)    LMP 03/05/2012 (Approximate)    SpO2 94%    BMI 31.83 kg/m   BP Readings from Last 3 Encounters:  03/09/21 126/84  12/22/20 117/73  12/09/20 110/74    Wt Readings from Last 3 Encounters:  03/09/21 163 lb (73.9 kg)  12/22/20 178 lb (80.7 kg)  12/09/20 173 lb (78.5 kg)    Physical Exam Vitals reviewed. Exam conducted with a chaperone present (shirron).  HENT:     Nose: Nose normal.     Mouth/Throat:     Mouth: Mucous membranes are moist.  Eyes:     General: No scleral icterus.    Conjunctiva/sclera: Conjunctivae normal.  Cardiovascular:     Rate and Rhythm: Normal rate and regular rhythm.     Heart sounds: No murmur heard. Pulmonary:     Effort: Pulmonary effort is normal.     Breath sounds: No stridor. No wheezing, rhonchi or rales.  Chest:     Chest wall: No mass, swelling or tenderness.  Breasts:    Right: Normal.     Left: Normal.    Abdominal:     General: Abdomen is flat.     Palpations: There is no mass.     Tenderness: There is no abdominal tenderness. There is no guarding.     Hernia: No hernia is present.  Musculoskeletal:        General: Deformity present. No swelling or tenderness.     Right forearm: Deformity present.       Arms:     Cervical back: Neck supple.     Right lower leg: No edema.     Left lower leg: No edema.  Lymphadenopathy:     Cervical: No cervical adenopathy.      Upper Body:     Right upper body: No supraclavicular, axillary or pectoral adenopathy.     Left upper body: No supraclavicular, axillary or pectoral adenopathy.  Skin:    General: Skin is warm and dry.     Findings: No rash.  Neurological:     General: No focal deficit present.  Mental Status: She is alert.  Psychiatric:        Mood and Affect: Mood normal.        Behavior: Behavior normal.    Lab Results  Component Value Date   WBC 8.9 03/09/2021   HGB 14.6 03/09/2021   HCT 42.4 03/09/2021   PLT 227.0 03/09/2021   GLUCOSE 140 (H) 03/09/2021   CHOL 258 (H) 03/09/2021   TRIG 116.0 03/09/2021   HDL 71.90 03/09/2021   LDLDIRECT 143.0 09/14/2016   LDLCALC 163 (H) 03/09/2021   ALT 16 03/09/2021   AST 22 03/09/2021   NA 143 03/09/2021   K 2.6 (LL) 03/09/2021   CL 96 03/09/2021   CREATININE 1.16 03/09/2021   BUN 13 03/09/2021   CO2 33 (H) 03/09/2021   TSH 0.34 (L) 03/09/2021   INR 0.94 12/21/2017   HGBA1C 6.5 03/09/2021   MICROALBUR 0.9 03/12/2020    DG UGI W SINGLE CM (SOL OR THIN BA)  Result Date: 12/01/2020 CLINICAL DATA:  A 66 year old female presents for evaluation of suspected reflux, history of Nissen fundoplication and prior esophageal hernia reduction. EXAM: UPPER GI SERIES WITH KUB TECHNIQUE: After obtaining a scout radiograph a routine upper GI series was performed using thin barium FLUOROSCOPY TIME:  Fluoroscopy Time:  3 minutes 18 seconds Radiation Exposure Index (if provided by the fluoroscopic device): 27.2 mGy Number of Acquired Spot Images: 2 COMPARISON:  Previous hiatal hernia repair and this in fundoplication. FINDINGS: KUB without acute process.  Moderate stool in the ascending colon. Swallow assessment in lateral projection shows a small outpouching along the posterior esophagus at the C5-6 level which is transient initially but hold barium on some later images. Esophageal contour and caliber is normal. Expected changes related to fundoplication are  noted at the GE junction. Single swallow assessment shows an intact primary wave with mild proximal escape of the bolus and tertiary peristaltic activity along the length of the esophagus. In this projection the small outpouching from the proximal cervical esophagus compatible was anchors diverticulum measuring 5 mm is noted to retain contrast. Assessment for reflux was negative despite multiple maneuvers and attempts to cause reflux including water siphon. Stomach was normal. Small bowel with normal appearance and normal anatomy. IMPRESSION: Small Zenker's diverticulum suspected, attention on upcoming endoscopy, otherwise unremarkable upper GI assessment with single contrast with changes of Nissen fundoplication. No recurrent hiatal hernia or reflux. Electronically Signed   By: Zetta Bills M.D.   On: 12/01/2020 12:54    Assessment & Plan:   Mabrey was seen today for annual exam, hypertension, diabetes and hyperlipidemia.  Diagnoses and all orders for this visit:  Estrogen deficiency -     DG Bone Density; Future  Cervical cancer screening -     Ambulatory referral to Gynecology  Subcutaneous mass of right forearm -     Ambulatory referral to Orthopedic Surgery  Visit for screening mammogram -     MM DIGITAL SCREENING BILATERAL; Future  Essential hypertension- Her blood pressure is somewhat overcontrolled and she has developed hypokalemia.  She has a normal volume status.  I recommended that she stop taking the loop diuretic and start taking a potassium sparing diuretic. -     Basic metabolic panel; Future -     Urinalysis, Routine w reflex microscopic; Future -     CBC with Differential/Platelet; Future -     CBC with Differential/Platelet -     Urinalysis, Routine w reflex microscopic -     Basic metabolic  panel  Hypothyroidism, unspecified type- Her TSH is suppressed.  I recommended that she decrease her T4 dosage. -     TSH; Future -     TSH -     levothyroxine (SYNTHROID) 25  MCG tablet; Take 1 tablet (25 mcg total) by mouth daily.  Type 2 diabetes mellitus without complication, without long-term current use of insulin ([place])- Her blood sugar is adequately well controlled. -     Hemoglobin A1c; Future -     HM Diabetes Foot Exam -     Hemoglobin A1c  Vitamin D deficiency, unspecified- Vitamin D level is normal now. -     VITAMIN D 25 Hydroxy (Vit-D Deficiency, Fractures); Future -     VITAMIN D 25 Hydroxy (Vit-D Deficiency, Fractures)  Dyslipidemia, goal LDL below 100- She has not achieved her LDL goal.  I have asked her to be more compliant with the statin. -     Lipid panel; Future -     Hepatic function panel; Future -     TSH; Future -     TSH -     Hepatic function panel -     Lipid panel  Need for vaccination -     Pneumococcal conjugate vaccine 20-valent (Prevnar 20)  Grade I diastolic dysfunction- She has a normal volume status.  Chronic hypokalemia -     spironolactone (ALDACTONE) 25 MG tablet; Take 1 tablet (25 mg total) by mouth daily.   I have discontinued Langston B. Terra's sertraline, famotidine, metroNIDAZOLE, Rexulti, torsemide, Nuzyra, and levothyroxine. I am also having her start on levothyroxine and spironolactone. Additionally, I am having her maintain her ketoconazole, acetaminophen, sodium chloride, zinc gluconate, trihexyphenidyl, hydrocortisone, Vitamin C CR, rizatriptan, Azelastine HCl, Cinnamon, omeprazole, Myrbetriq, clonazePAM, rosuvastatin, promethazine, fluticasone, budesonide-formoterol, metoprolol tartrate, Carbinoxamine Maleate, albuterol, fluticasone, and montelukast.  Meds ordered this encounter  Medications   levothyroxine (SYNTHROID) 25 MCG tablet    Sig: Take 1 tablet (25 mcg total) by mouth daily.    Dispense:  90 tablet    Refill:  0   spironolactone (ALDACTONE) 25 MG tablet    Sig: Take 1 tablet (25 mg total) by mouth daily.    Dispense:  90 tablet    Refill:  0     Follow-up: Return in about 3 months  (around 06/07/2021).  [name], MD

## 2021-03-09 NOTE — Telephone Encounter (Signed)
CRITICAL VALUE STICKER  CRITICAL VALUE: Potassium 2.6  RECEIVER (on-site recipient of call):  Lorenda Peck, RN    DATE & TIME NOTIFIED: 03/09/2021 1705  MESSENGER (representative from lab):  Hope Scales  MD NOTIFIED: Dr. Ronnald Ramp  TIME OF NOTIFICATION: [phone]

## 2021-03-09 NOTE — Patient Instructions (Signed)

## 2021-03-10 ENCOUNTER — Telehealth: Payer: Self-pay

## 2021-03-10 NOTE — Telephone Encounter (Signed)
noted 

## 2021-03-10 NOTE — Telephone Encounter (Signed)
Pt calling in to get results since she is having trouble logging in to [facility]. I advised pt of Dr. Ronnald Ramp recommendations and the Rx sent to the pharmacy.   Pt scheduled a F/U appt 04/28/21.  Pt expressed understand and agreed with the new Rx recommendations.  FYI

## 2021-03-12 LAB — URINALYSIS, ROUTINE W REFLEX MICROSCOPIC
Bilirubin Urine: NEGATIVE
Hgb urine dipstick: NEGATIVE
Ketones, ur: NEGATIVE
Nitrite: NEGATIVE
Specific Gravity, Urine: <=1.005 — AB (ref 1.000–1.030)
Total Protein, Urine: NEGATIVE
Urine Glucose: NEGATIVE
Urobilinogen, UA: 0.2 (ref 0.0–1.0)
pH: 6.5 (ref 5.0–8.0)

## 2021-03-13 ENCOUNTER — Ambulatory Visit: Payer: Medicare HMO | Admitting: Orthopedic Surgery

## 2021-03-14 ENCOUNTER — Other Ambulatory Visit: Payer: Self-pay | Admitting: Internal Medicine

## 2021-03-14 DIAGNOSIS — I10 Essential (primary) hypertension: Secondary | ICD-10-CM

## 2021-03-14 DIAGNOSIS — I5189 Other ill-defined heart diseases: Secondary | ICD-10-CM

## 2021-03-14 DIAGNOSIS — Z0001 Encounter for general adult medical examination with abnormal findings: Secondary | ICD-10-CM | POA: Insufficient documentation

## 2021-03-14 DIAGNOSIS — E876 Hypokalemia: Secondary | ICD-10-CM | POA: Insufficient documentation

## 2021-03-14 NOTE — Assessment & Plan Note (Signed)
Exam completed Labs reviewed Vaccines reviewed Cancer screenings addressed Patient education was given

## 2021-03-16 ENCOUNTER — Ambulatory Visit: Payer: Medicare HMO

## 2021-03-16 ENCOUNTER — Other Ambulatory Visit: Payer: Self-pay

## 2021-03-19 ENCOUNTER — Ambulatory Visit: Payer: Medicare HMO | Admitting: Orthopedic Surgery

## 2021-03-19 ENCOUNTER — Encounter: Payer: Self-pay | Admitting: Orthopedic Surgery

## 2021-03-19 ENCOUNTER — Other Ambulatory Visit: Payer: Self-pay

## 2021-03-19 DIAGNOSIS — R2231 Localized swelling, mass and lump, right upper limb: Secondary | ICD-10-CM

## 2021-03-19 NOTE — Progress Notes (Signed)
Office Visit Note   Patient: [name]           Date of Birth: September 13, 1955           MRN: [id number] Visit Date: 03/19/2021              Requested by: Janith Lima, MD [address] [place],  [place] [address] PCP: Janith Lima, MD   Assessment & Plan: Visit Diagnoses:  1. Subcutaneous mass of right forearm     Plan: Discussed with patient that her mass seems subcutaneous in nature.  I would like to get an ultrasound to further characterize the mass.  I can see her back in the office after the study is completed.   Follow-Up Instructions: No follow-ups on file.   Orders:  No orders of the defined types were placed in this encounter.  No orders of the defined types were placed in this encounter.     Procedures: No procedures performed   Clinical Data: No additional findings.   Subjective: Chief Complaint  Patient presents with   Right Forearm - Pain, Mass    This is a 66 yo RHD F who presents with an incidentally noticed mass at the dorsal aspect of the forearm.  It was noticed months ago.  She thinks it has enlarged over time.  It is occasionally painful. She has no issues with elbow or forearm ROM.  She denies any other lumps or bumps.  She denies any fevers, chills, night sweats, unintended weight loss, or other systemic symptoms.    Review of Systems   Objective: Vital Signs: LMP 03/05/2012 (Approximate)   Physical Exam Constitutional:      Appearance: Normal appearance.  Cardiovascular:     Rate and Rhythm: Normal rate.     Pulses: Normal pulses.  Pulmonary:     Effort: Pulmonary effort is normal.  Skin:    General: Skin is warm and dry.     Capillary Refill: Capillary refill takes less than 2 seconds.  Neurological:     Mental Status: She is alert.    Right Hand Exam   Tenderness  Right hand tenderness location: Mild tenderness w/ firm compresion of dorsal forearm mass.  Other  Erythema: absent Sensation: normal Pulse:  present  Comments:  Irregularly shaped, firm, mobile mass at dorsal aspect of proximal forearm along subcutaneous border of the ulna.  No surrounding erythema or induration.  Does not appear to be adherent to underlying tissues.    Right Elbow Exam   Range of Motion  The patient has normal right elbow ROM.  Muscle Strength  The patient has normal right elbow strength.     Specialty Comments:  No specialty comments available.  Imaging: No results found.   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   Iron deficiency anemia 04/28/2018   OA (osteoarthritis) of knee 12/26/2017   PVC (premature ventricular contraction) 11/21/2017   Dysphonia 09/26/2017   Vitamin D deficiency, unspecified 10/13/2016   Osteoporosis without current pathological fracture 03/22/2016  Paraesophageal hiatal hernia s/p robotic repair & fundoplication 5/85/2778 24/23/5361   Bipolar 1 disorder, mixed, moderate (HCC) 01/29/2016   Class 1 obesity with body mass index (BMI) of 32.0 to 32.9 in adult 01/06/2016   Migraines 11/21/2014   Increased frequency of urination 05/30/2013   OSA on CPAP 01/30/2007   GERD 11/02/2006   DERMATITIS, HANDS 11/02/2006   Hypothyroidism 10/07/2006   Bipolar affective ([place]) 10/07/2006   Essential hypertension 10/07/2006   RHINITIS, CHRONIC 10/07/2006   Seasonal and perennial allergic rhinitis 10/07/2006   Moderate persistent asthma, uncomplicated 44/31/5400   Irritable  bowel syndrome 10/07/2006   INTERSTITIAL CYSTITIS 10/07/2006   ROSACEA 10/07/2006   Osteoarthritis of both knees 10/07/2006   Disturbance in sleep behavior 10/07/2006   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   Hypothyroidism    Intermittent palpitations    cardiology--  dr [place]   Interstitial cystitis    Limited jaw range of motion    s/p  bilateral TMJ surgery,  age 21s   Migraines    Moderate persistent asthma    pulmologist-- dr Halford Chessman   OA (osteoarthritis)    both knees   OSA on CPAP    per last sleep study 09/ 2017  mild OSA, AHI13/hr   PONV (postoperative nausea and vomiting)    PVC (premature ventricular contraction)    Rosacea    Sensation of pressure in bladder area    per pt intermittant   TMJ arthralgia    Urticaria    Wears glasses    White coat syndrome without hypertension    05-25-2017  per pt hx hypertension yrs ago, no issues after quitting stressful job    Family History  Problem Relation Age of Onset   Hypertension Mother        deceased from MVA complications   Thyroid disease Mother    Allergic rhinitis Mother    Testicular cancer Father    Allergic rhinitis Father    Colon polyps Sister    Healthy Sister    Healthy Brother    Coronary artery disease Other    Colon cancer Neg Hx    Stomach cancer Neg Hx     Past Surgical History:  Procedure Laterality Date   60 HOUR [place] STUDY N/A 09/22/2015   Procedure: 24 HOUR PH STUDY;  Surgeon:  Doran Stabler, MD;  Location: [facility];  Service: Gastroenterology;  Laterality: N/A;   ADENOIDECTOMY     ANAL FISSURE REPAIR  age 47  (50)   BREAST EXCISIONAL BIOPSY Right 04-16-2003  dr p. young  [facility]   benign   BUNIONECTOMY Right early 2000s   CARPAL TUNNEL RELEASE Left age 11 (52)   CHOLECYSTECTOMY OPEN  age 71   CYSTO WITH HYDRODISTENSION N/A 06/02/2017   Procedure: CYSTOSCOPY/HYDRODISTENSION OF BLADDER;  Surgeon: Jeffie Pollock,  Jenny Reichmann, MD;  Location: [facility];  Service: Urology;  Laterality: N/A;   CYSTO/ HYDRODISTENTION/  INSTILLATION THERAPY  1990s   DX LAPAROSCOPY/  DX HYSTEROSCOPY/  D & C  08-07-2007   dr dove  [place]  10-21-2008   dr Harolyn Rutherford  [facility]   ESOPHAGEAL MANOMETRY N/A 09/22/2015   Procedure: ESOPHAGEAL MANOMETRY (EM);  Surgeon: Doran Stabler, MD;  Location: [facility];  Service: Gastroenterology;  Laterality: N/A;  with impedence    FINGER ARTHROPLASTY Bilateral    left little finger 2016:  right middle finger 06/ 2018   KNEE ARTHROSCOPY Bilateral x3 left ;  x3  right-- last one age 59 (78)   NASAL SEPTUM SURGERY  age 7s   ROBOT ASSISTED REDUCTION PARAESOPHAGEAL HIATAL HERNIA/ TYPE 2 MEDIASTINAL DISSECTION/ PRIMARY HIATAL HERNIA REPAIR/ ANTERIOR & POSTERIOR GASTROPEXY/ NISSEN FUNDOPLICATION  [phone]   DR GROSS  [facility]   SINOSCOPY     TEMPOROMANDIBULAR JOINT SURGERY Bilateral x3  -- age 89s   per pt used graft   TONSILLECTOMY     TOTAL KNEE ARTHROPLASTY Left 12/26/2017   Procedure: LEFT TOTAL KNEE ARTHROPLASTY;  Surgeon: Gaynelle Arabian, MD;  Location: [facility];  Service: Orthopedics;  Laterality: Left;  7min   TRANSTHORACIC ECHOCARDIOGRAM  12-06-2017   dr [place]   ef 55-60%, grade 1 diastoilc dysfunction, trivial MR   TRIGGER FINGER RELEASE Bilateral last one 2017   several release's bilaterally   ULNAR NERVE TRANSPOSITION Bilateral age 40 (84)   Social History   Occupational History   Not on file  Tobacco  Use   Smoking status: Never   Smokeless tobacco: Never  Vaping Use   Vaping Use: Never used  Substance and Sexual Activity   Alcohol use: Yes    Alcohol/week: 0.0 standard drinks    Comment: once every 3-4 months   Drug use: No   Sexual activity: Not on file

## 2021-03-23 ENCOUNTER — Other Ambulatory Visit: Payer: Self-pay | Admitting: Internal Medicine

## 2021-03-23 DIAGNOSIS — E039 Hypothyroidism, unspecified: Secondary | ICD-10-CM

## 2021-03-24 ENCOUNTER — Other Ambulatory Visit: Payer: Self-pay

## 2021-03-24 ENCOUNTER — Ambulatory Visit (INDEPENDENT_AMBULATORY_CARE_PROVIDER_SITE_OTHER): Payer: Medicare HMO

## 2021-03-24 ENCOUNTER — Ambulatory Visit: Payer: Medicare HMO

## 2021-03-24 VITALS — Wt 163.0 lb

## 2021-03-24 DIAGNOSIS — E785 Hyperlipidemia, unspecified: Secondary | ICD-10-CM | POA: Diagnosis not present

## 2021-03-24 DIAGNOSIS — Z0000 Encounter for general adult medical examination without abnormal findings: Secondary | ICD-10-CM | POA: Diagnosis not present

## 2021-03-24 MED ORDER — ROSUVASTATIN CALCIUM 20 MG PO TABS: 20.0000 mg | ORAL_TABLET | Freq: Every day | ORAL | 0 refills | Status: DC

## 2021-03-24 NOTE — Progress Notes (Signed)
Subjective:   NIMRIT KEHRES is a 66 y.o. female who presents for Medicare Annual (Subsequent) preventive examination.  Virtual Visit via Telephone Note  I connected with  MYLINDA BROOK on 03/24/21 at  1:00 PM EST by telephone and verified that I am speaking with the correct person using two identifiers.  Location: Patient: Home Provider: [facility] Persons participating in the virtual visit: patient/Nurse Health Advisor   I discussed the limitations, risks, security and privacy concerns of performing an evaluation and management service by telephone and the availability of in person appointments. The patient expressed understanding and agreed to proceed.  Interactive audio and video telecommunications were attempted between this nurse and patient, however failed, due to patient having technical difficulties OR patient did not have access to video capability.  We continued and completed visit with audio only.  Some vital signs may be absent or patient reported.   Preston Garabedian E Jarl Sellitto, LPN   Review of Systems     Cardiac Risk Factors include: hypertension;sedentary lifestyle;obesity (BMI >30kg/m2);diabetes mellitus;Other (see comment);dyslipidemia, Risk factor comments: OSA - not on CPAP     Objective:    Today's Vitals   03/24/21 1255  Weight: 163 lb (73.9 kg)   Body mass index is 31.83 kg/m.  Advanced Directives 03/24/2021 11/08/2019 08/24/2018 01/16/2018 01/16/2018 12/26/2017 12/21/2017  Does Patient Have a Medical Advance Directive? No No No No No No No  Would patient like information on creating a medical advance directive? No - Patient declined Yes (MAU/Ambulatory/Procedural Areas - Information given) No - Patient declined No - Patient declined - No - Patient declined -  Pre-existing out of facility DNR order (yellow form or pink MOST form) - - - - - - -    Current Medications (verified) Outpatient Encounter Medications as of 03/24/2021  Medication Sig   acetaminophen (TYLENOL)  500 MG tablet Take 500 mg by mouth every 6 (six) hours as needed.   albuterol (VENTOLIN HFA) 108 (90 Base) MCG/ACT inhaler INHALE 1-2 PUFFS INTO THE LUNGS DAILY AS NEEDED FOR WHEEZING OR SHORTNESS OF BREATH.   Ascorbic Acid (VITAMIN C CR) 500 MG CPCR Take by mouth.   Carbinoxamine Maleate 4 MG TABS TAKE 1 TABLET BY MOUTH EVERY 6 HOURS.   cariprazine (VRAYLAR) 3 MG capsule    Cinnamon 500 MG capsule Cinnamon 500 mg capsule   clonazePAM (KLONOPIN) 0.5 MG tablet Take 0.5 mg by mouth 2 (two) times daily as needed.   DULoxetine HCl 30 MG CSDR    fluticasone (FLONASE) 50 MCG/ACT nasal spray PLACE 1 SPRAY INTO BOTH NOSTRILS 2 (TWO) TIMES DAILY AS NEEDED FOR ALLERGIES OR RHINITIS.   hydrocortisone 2.5 % ointment Apply topically 2 (two) times daily.   ketoconazole (NIZORAL) 2 % cream Apply topically daily.    levothyroxine (SYNTHROID) 25 MCG tablet Take 1 tablet (25 mcg total) by mouth daily.   metoprolol tartrate (LOPRESSOR) 25 MG tablet TAKE 1 TABLET BY MOUTH TWICE A DAY   montelukast (SINGULAIR) 10 MG tablet TAKE 1 TABLET BY MOUTH EVERYDAY AT BEDTIME   MYRBETRIQ 50 MG TB24 tablet Take 50 mg by mouth daily.   rizatriptan (MAXALT-MLT) 10 MG disintegrating tablet TAKE 1 TABLET EVERY DAY AS NEEDED FOR MIGRAINE, MAY REPEAT IN 2 HRS IF NEEDED. MAX 2 DOSES/WK   sodium chloride (OCEAN) 0.65 % SOLN nasal spray Place 1 spray into both nostrils as needed for congestion.   spironolactone (ALDACTONE) 25 MG tablet Take 1 tablet (25 mg total) by mouth daily.  trihexyphenidyl (ARTANE) 2 MG tablet Take 2 mg by mouth daily.   zinc gluconate 50 MG tablet Take 50 mg by mouth daily.   zolpidem (AMBIEN) 10 MG tablet Take 10 mg by mouth at bedtime.   Azelastine HCl 0.15 % SOLN Place 1-2 sprays into both nostrils 2 (two) times daily.   budesonide-formoterol (SYMBICORT) 160-4.5 MCG/ACT inhaler Inhale 2 puffs into the lungs in the morning and at bedtime.   fluticasone (FLOVENT HFA) 220 MCG/ACT inhaler During respiratory  infections or worsening symptoms Inhale 4 puffs 2 times daily for up to TWO WEEKS. (Patient not taking: Reported on 03/24/2021)   omeprazole (PRILOSEC) 20 MG capsule omeprazole 20 mg capsule,delayed release (Patient not taking: Reported on 03/24/2021)   promethazine (PHENERGAN) 25 MG tablet Take 1 tablet (25 mg total) by mouth every 6 (six) hours as needed for nausea or vomiting. (Patient not taking: Reported on 03/24/2021)   [DISCONTINUED] rosuvastatin (CRESTOR) 20 MG tablet Take 1 tablet (20 mg total) by mouth daily.   No facility-administered encounter medications on file as of 03/24/2021.    Allergies (verified) Emetrol, Indomethacin, Iodinated contrast media, Amlodipine, Carbamazepine, Pseudoephedrine, Risperidone and related, Bupivacaine, Pentazocine, Propoxyphene, Sulfa antibiotics, Tramadol, Aspirin, Codeine, Etodolac, and Propranolol   History: 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   Hypothyroidism    Intermittent palpitations    cardiology--  dr Martinique   Interstitial cystitis    Limited jaw range of motion    s/p  bilateral TMJ surgery,  age 43s   Migraines    Moderate persistent asthma    pulmologist-- dr Halford Chessman   OA (osteoarthritis)    both knees   OSA on CPAP    per last sleep study 09/ 2017  mild OSA, AHI13/hr   PONV (postoperative nausea and vomiting)    PVC (premature ventricular contraction)    Rosacea    Sensation of pressure in bladder area    per pt  intermittant   TMJ arthralgia    Urticaria    Wears glasses    White coat syndrome without hypertension    05-25-2017  per pt hx hypertension yrs ago, no issues after quitting stressful job   Past Surgical History:  Procedure Laterality Date   8 HOUR [place] STUDY N/A 09/22/2015   Procedure: 24 HOUR [place] STUDY;  Surgeon: Doran Stabler, MD;  Location: [facility];  Service: Gastroenterology;  Laterality: N/A;   ADENOIDECTOMY     ANAL FISSURE REPAIR  age 59  (12)   BREAST EXCISIONAL BIOPSY Right 04-16-2003  dr p. young  [facility]   benign   BUNIONECTOMY Right early 2000s   CARPAL TUNNEL RELEASE Left age 59 (57)   CHOLECYSTECTOMY OPEN  age 39   CYSTO WITH HYDRODISTENSION N/A 06/02/2017   Procedure: CYSTOSCOPY/HYDRODISTENSION OF BLADDER;  Surgeon: Irine Seal, MD;  Location:  [place];  Service: Urology;  Laterality: N/A;   CYSTO/ HYDRODISTENTION/  INSTILLATION THERAPY  1990s   DX LAPAROSCOPY/  DX HYSTEROSCOPY/  D & C  08-07-2007   dr dove  [place]  10-21-2008   dr Harolyn Rutherford  [facility]   ESOPHAGEAL MANOMETRY N/A 09/22/2015   Procedure: ESOPHAGEAL MANOMETRY (EM);  Surgeon: Doran Stabler, MD;  Location: [facility];  Service: Gastroenterology;  Laterality: N/A;  with impedence    FINGER ARTHROPLASTY Bilateral    left little finger 2016:  right middle finger 06/ 2018   KNEE ARTHROSCOPY Bilateral x3 left ;  x3  right-- last one age 65 (42)   NASAL SEPTUM SURGERY  age 89s   ROBOT ASSISTED REDUCTION PARAESOPHAGEAL HIATAL HERNIA/ TYPE 2 MEDIASTINAL DISSECTION/ PRIMARY HIATAL HERNIA REPAIR/ ANTERIOR & POSTERIOR GASTROPEXY/ NISSEN FUNDOPLICATION  22-03-5425   DR GROSS  [facility]   SINOSCOPY     TEMPOROMANDIBULAR JOINT SURGERY Bilateral x3  -- age 72s   per pt used graft   TONSILLECTOMY     TOTAL KNEE ARTHROPLASTY Left 12/26/2017   Procedure: LEFT TOTAL KNEE ARTHROPLASTY;  Surgeon: Gaynelle Arabian, MD;  Location: [facility];  Service: Orthopedics;  Laterality: Left;   34min   TRANSTHORACIC ECHOCARDIOGRAM  12-06-2017   dr [place]ique   ef 55-60%, grade 1 diastoilc dysfunction, trivial MR   TRIGGER FINGER RELEASE Bilateral last one 2017   several release's bilaterally   ULNAR NERVE TRANSPOSITION Bilateral age 24 (65)   Family History  Problem Relation Age of Onset   Hypertension Mother        deceased from MVA complications   Thyroid disease Mother    Allergic rhinitis Mother    Testicular cancer Father    Allergic rhinitis Father    Colon polyps Sister    Healthy Sister    Healthy Brother    Coronary artery disease Other    Colon cancer Neg Hx    Stomach cancer Neg Hx    Social History   Socioeconomic History   Marital status: Divorced    Spouse name: Not on file   Number of children: 0   Years of education: Not on file   Highest education level: Bachelor's degree (e.g., BA, AB, BS)  Occupational History   Occupation: disability  Tobacco Use   Smoking status: Never   Smokeless tobacco: Never  Vaping Use   Vaping Use: Never used  Substance and Sexual Activity   Alcohol use: Yes    Alcohol/week: 0.0 standard drinks    Comment: once every 3-4 months   Drug use: No   Sexual activity: Not on file  Other Topics Concern   Not on file  Social History Narrative   Not on file   Social Determinants of Health   Financial Resource Strain: Low Risk    Difficulty of Paying Living Expenses: Not hard at all  Food Insecurity: No Food Insecurity   Worried About Charity fundraiser in the Last Year: Never true   [place] in the Last Year: Never true  Transportation Needs: No Transportation Needs   Lack of Transportation (Medical): No   Lack of Transportation (Non-Medical): No  Physical Activity: Insufficiently Active   Days of Exercise per Week: 7 days   Minutes of Exercise per Session: 20 min  Stress: Stress Concern Present   Feeling of Stress : To some extent  Social Connections: Moderately Integrated   Frequency of Communication  with Friends and Family: More than three times a week   Frequency of Social Gatherings with Friends and Family: Twice a week   Attends Religious Services: More than 4 times per year   Active Member of [employer] or Organizations: Yes   Attends Music therapist: More than 4 times per year   Marital Status: Divorced    Tobacco Counseling Counseling given: Not Answered   Clinical Intake:  Pre-visit preparation completed: Yes  Pain : 0-10 Pain Type: Chronic pain Pain Location: Knee Pain Orientation: Right Pain Descriptors / Indicators: Aching, Sore, Tender Pain Frequency: Intermittent     BMI - recorded: 31.83 Nutritional Status: BMI > 30  Obese Nutritional Risks: None Diabetes: Yes CBG done?: No Did pt. bring in CBG monitor from home?: No  How often do you need to have someone help you when you read instructions, pamphlets, or other written materials from your doctor or pharmacy?: 1 - Never  Diabetic? Nutrition Risk Assessment:  Has the patient had any N/V/D within the last 2 months?  No  Does the patient have any non-healing wounds?  Yes  - she has a sore on thigh that has been there for months - thinking of seeing Dermatology Has the patient had any unintentional weight loss or weight gain?  No   Diabetes:  Is the patient diabetic?  Yes  If diabetic, was a CBG obtained today?  No  Did the patient bring in their glucometer from home?  No  How often do you monitor your CBG's? never.   Financial Strains and Diabetes Management:  Are you having any financial strains with the device, your supplies or your medication? No .  Does the patient want to be seen by Chronic Care Management for management of their diabetes?  No  Would the patient like to be referred to a Nutritionist or for Diabetic Management?  No   Diabetic Exams:  Diabetic Eye Exam: Completed 06/26/2020.   Diabetic Foot Exam: Pt has been advised about the importance in completing this exam. Pt is  scheduled for diabetic foot exam on 04/30/2021.    Interpreter Needed?: No  Information entered by :: Loghan Kurtzman, LPN   Activities of Daily Living In your present state of health, do you have any difficulty performing the following activities: 03/24/2021  Hearing? N  Vision? N  Difficulty concentrating or making decisions? N  Walking or climbing stairs? N  Dressing or bathing? N  Doing errands, shopping? N  Preparing Food and eating ? N  Using the Toilet? N  In the past six months, have you accidently leaked urine? Y  Comment taking Myrbetriq for IC  Do you have problems with loss of bowel control? Y  Comment rare - if she doesn't eat right  Managing your Medications? N  Managing your Finances? N  Housekeeping or managing your Housekeeping? N  Some recent data might be hidden    Patient Care Team: Janith Lima, MD as PCP - General (Internal Medicine) Martinique, Peter M, MD as PCP - Cardiology (Cardiology) Michael Boston, MD as Consulting Physician (General Surgery) Union, Kirke Corin, MD as Consulting Physician (Gastroenterology) Viona Gilmore, [facility] as Pharmacist (Pharmacist) Raylene Miyamoto, MD as Referring Physician (Otolaryngology) Linward Headland, NP as Nurse Practitioner (Psychology) Chesley Mires, MD as Consulting Physician (Pulmonary Disease) Valentina Shaggy, MD as Consulting Physician (Allergy and Immunology) Melissa Noon, OD as Referring Physician (Optometry)  Indicate any recent Medical Services you may have received  from other than [facility] providers in the past year (date may be approximate).     Assessment:   This is a routine wellness examination for Hafsa.  Hearing/Vision screen Hearing Screening - Comments:: Denies hearing difficulties  except when there is a lot of background noise. Vision Screening - Comments:: Wears rx glasses - up to date with routine eye exams with [facility]  Dietary issues and exercise activities discussed: Current Exercise  Habits: Home exercise routine, Type of exercise: walking, Time (Minutes): 20, Frequency (Times/Week): 7, Weekly Exercise (Minutes/Week): 140, Intensity: Mild, Exercise limited by: None identified   Goals Addressed             This Visit's Progress    Exercise 3x per week (30 min per time)   On track      Depression Screen PHQ 2/9 Scores 03/24/2021 11/08/2019 10/31/2018 10/24/2017 05/30/2015 03/10/2015 11/21/2014  PHQ - 2 Score 0 0 2 1 3 4 5   PHQ- 9 Score - 0 - - 9 16 14   Exception Documentation - - - Other- indicate reason in comment box - - -  Not completed - - - Hx of depression,bipolar,and anxiety. Follows with psychiatrist. - - -    Fall Risk Fall Risk  03/24/2021 11/08/2019 10/31/2018 02/23/2018 11/08/2017  Falls in the past year? 0 0 0 0 Yes  Comment - - - - -  Number falls in past yr: 0 0 0 0 1  Injury with Fall? 0 0 0 0 No  Risk Factor Category  - - - - -  Risk for fall due to : Orthopedic patient Medication side effect Impaired balance/gait;Orthopedic patient - -  Follow up Falls prevention discussed Falls evaluation completed;Falls prevention discussed Education provided Falls evaluation completed Falls evaluation completed  Comment - - - - -    FALL RISK PREVENTION PERTAINING TO THE HOME:  Any stairs in or around the home? No  If so, are there any without handrails? No  Home free of loose throw rugs in walkways, pet beds, electrical cords, etc? Yes  Adequate lighting in your home to reduce risk of falls? Yes   ASSISTIVE DEVICES UTILIZED TO PREVENT FALLS:  Life alert? No  Use of a cane, walker or w/c? No  Grab bars in the bathroom? No  Shower chair or bench in shower? Yes  Elevated toilet seat or a handicapped toilet? No   TIMED UP AND GO:  Was the test performed? No . Telephonic visit  Cognitive Function:     6CIT Screen 03/24/2021  What Year? 0 points  What month? 0 points  What time? 0 points  Count back from 20 0 points  Months in reverse 0 points  Repeat  phrase 0 points  Total Score 0    Immunizations Immunization History  Administered Date(s) Administered   Influenza Split 02/09/2012   Influenza Whole 12/08/2006, 12/01/2007   Influenza,inj,Quad PF,6+ Mos 12/11/2013, 11/21/2014, 10/13/2016, 10/24/2017, 10/31/2018, 11/08/2019   Influenza-Unspecified 12/01/2015, 10/21/2020   Moderna Covid-19 Vaccine Bivalent Booster 43yrs & up 12/29/2020   Moderna Sars-Covid-2 Vaccination 04/28/2019, 05/29/2019, 01/26/2020   PNEUMOCOCCAL CONJUGATE-20 03/09/2021   Pneumococcal Polysaccharide-23 12/12/2013   Tdap 04/16/2011, 06/25/2013, 08/25/2020   Zoster Recombinat (Shingrix) 05/30/2018, 08/16/2018    TDAP status: Up to date  Flu Vaccine status: Up to date  Pneumococcal vaccine status: Up to date  Covid-19 vaccine status: Completed vaccines  Qualifies for Shingles Vaccine? Yes   Zostavax completed Yes   Shingrix Completed?: Yes  Screening Tests Health Maintenance  Topic Date Due   DEXA SCAN  03/19/2018   MAMMOGRAM  10/17/2020   PAP SMEAR-Modifier  10/24/2020   URINE MICROALBUMIN  03/12/2021   OPHTHALMOLOGY EXAM  06/26/2021   HEMOGLOBIN A1C  09/06/2021   FOOT EXAM  03/09/2022   COLONOSCOPY (Pts 45-49yrs Insurance coverage will need to be confirmed)  04/02/2024   TETANUS/TDAP  08/26/2030   INFLUENZA VACCINE  Completed   COVID-19 Vaccine  Completed   Hepatitis C Screening  Completed   HIV Screening  Completed   Zoster Vaccines- Shingrix  Completed   HPV VACCINES  Aged Out    Health Maintenance  Health Maintenance Due  Topic Date Due   DEXA SCAN  03/19/2018   MAMMOGRAM  10/17/2020   PAP SMEAR-Modifier  10/24/2020   URINE MICROALBUMIN  03/12/2021    Colorectal cancer screening: Type of screening: Colonoscopy. Completed 04/02/2014. Repeat every 10 years  Mammogram status: Completed 10/18/2019. Repeat every year - order in chart  Bone Density status: Completed 03/19/2016. Results reflect: Bone density results: OSTEOPOROSIS. Repeat  every 2 years.  Lung Cancer Screening: (Low Dose CT Chest recommended if Age 66-80 years, 30 pack-year currently smoking OR have quit w/in 15years.) does not qualify.   Additional Screening:  Hepatitis C Screening: does qualify; Completed 11/21/2014  Vision Screening: Recommended annual ophthalmology exams for early detection of glaucoma and other disorders of the eye. Is the patient up to date with their annual eye exam?  Yes  Who is the provider or what is the name of the office in which the patient attends annual eye exams? [facility] If pt is not established with a provider, would they like to be referred to a provider to establish care? No .   Dental Screening: Recommended annual dental exams for proper oral hygiene  Community Resource Referral / Chronic Care Management: CRR required this visit?  No   CCM required this visit?  No      Plan:     I have personally reviewed and noted the following in the patients chart:   Medical and social history Use of alcohol, tobacco or illicit drugs  Current medications and supplements including opioid prescriptions.  Functional ability and status Nutritional status Physical activity Advanced directives List of other physicians Hospitalizations, surgeries, and ER visits in previous 12 months Vitals Screenings to include cognitive, depression, and falls Referrals and appointments  In addition, I have reviewed and discussed with patient certain preventive protocols, quality metrics, and best practice recommendations. A written personalized care plan for preventive services as well as general preventive health recommendations were provided to patient.     Sandrea Hammond, LPN   [id number]   Nurse Notes: Felt rushed at last dermatology appt - when she sees Dr Ronnald Ramp next month, wants him to look at skin on thigh as well as on breast - she feels these places are abnormal

## 2021-03-24 NOTE — Patient Instructions (Signed)
[name] , Thank you for taking time to come for your Medicare Wellness Visit. I appreciate your ongoing commitment to your health goals. Please review the following plan we discussed and let me know if I can assist you in the future.   Screening recommendations/referrals: Colonoscopy: Done 04/02/2014 - Repeat in 10 years Mammogram: Done 10/18/2019 - Repeat annually *due - order in chart Bone Density: Done 03/19/2016 - Repeat every 2 years *due - order in chart Recommended yearly ophthalmology/optometry visit for glaucoma screening and checkup Recommended yearly dental visit for hygiene and checkup  Vaccinations: Influenza vaccine: Done 10/21/2020 - Repeat annually Pneumococcal vaccine: Done 12/12/2013 & 03/09/2021 Tdap vaccine: Done 08/25/2020 - Repeat in 10 years Shingles vaccine: Done 05/30/2018 & 08/16/2018  Covid-19: Done 04/28/2019, 05/29/2019, 01/26/2020, & 12/30/2019  Advanced directives: Advance directive discussed with you today. Even though you declined this today, please call our office should you change your mind, and we can give you the proper paperwork for you to fill out.   Conditions/risks identified: Aim for 30 minutes of exercise or brisk walking each day, drink 6-8 glasses of water and eat lots of fruits and vegetables.   Next appointment: Follow up in one year for your annual wellness visit.   Preventive Care 40-64 Years, Female Preventive care refers to lifestyle choices and visits with your health care provider that can promote health and wellness. What does preventive care include? A yearly physical exam. This is also called an annual well check. Dental exams once or twice a year. Routine eye exams. Ask your health care provider how often you should have your eyes checked. Personal lifestyle choices, including: Daily care of your teeth and gums. Regular physical activity. Eating a healthy diet. Avoiding tobacco and drug use. Limiting alcohol use. Practicing safe  sex. Taking low-dose aspirin daily starting at age 13. Taking vitamin and mineral supplements as recommended by your health care provider. What happens during an annual well check? The services and screenings done by your health care provider during your annual well check will depend on your age, overall health, lifestyle risk factors, and family history of disease. Counseling  Your health care provider may ask you questions about your: Alcohol use. Tobacco use. Drug use. Emotional well-being. Home and relationship well-being. Sexual activity. Eating habits. Work and work Statistician. Method of birth control. Menstrual cycle. Pregnancy history. Screening  You may have the following tests or measurements: Height, weight, and BMI. Blood pressure. Lipid and cholesterol levels. These may be checked every 5 years, or more frequently if you are over 60 years old. Skin check. Lung cancer screening. You may have this screening every year starting at age 45 if you have a 30-pack-year history of smoking and currently smoke or have quit within the past 15 years. Fecal occult blood test (FOBT) of the stool. You may have this test every year starting at age 92. Flexible sigmoidoscopy or colonoscopy. You may have a sigmoidoscopy every 5 years or a colonoscopy every 10 years starting at age 41. Hepatitis C blood test. Hepatitis B blood test. Sexually transmitted disease (STD) testing. Diabetes screening. This is done by checking your blood sugar (glucose) after you have not eaten for a while (fasting). You may have this done every 1-3 years. Mammogram. This may be done every 1-2 years. Talk to your health care provider about when you should start having regular mammograms. This may depend on whether you have a family history of breast cancer. BRCA-related cancer screening. This may  be done if you have a family history of breast, ovarian, tubal, or peritoneal cancers. Pelvic exam and Pap test. This  may be done every 3 years starting at age 59. Starting at age 84, this may be done every 5 years if you have a Pap test in combination with an HPV test. Bone density scan. This is done to screen for osteoporosis. You may have this scan if you are at high risk for osteoporosis. Discuss your test results, treatment options, and if necessary, the need for more tests with your health care provider. Vaccines  Your health care provider may recommend certain vaccines, such as: Influenza vaccine. This is recommended every year. Tetanus, diphtheria, and acellular pertussis (Tdap, Td) vaccine. You may need a Td booster every 10 years. Zoster vaccine. You may need this after age 53. Pneumococcal 13-valent conjugate (PCV13) vaccine. You may need this if you have certain conditions and were not previously vaccinated. Pneumococcal polysaccharide (PPSV23) vaccine. You may need one or two doses if you smoke cigarettes or if you have certain conditions. Talk to your health care provider about which screenings and vaccines you need and how often you need them. This information is not intended to replace advice given to you by your health care provider. Make sure you discuss any questions you have with your health care provider. Document Released: 02/21/2015 Document Revised: 10/15/2015 Document Reviewed: 11/26/2014 Elsevier Interactive Patient Education  2017 [employer] Prevention in the Home Falls can cause injuries. They can happen to people of all ages. There are many things you can do to make your home safe and to help prevent falls. What can I do on the outside of my home? Regularly fix the edges of walkways and driveways and fix any cracks. Remove anything that might make you trip as you walk through a door, such as a raised step or threshold. Trim any bushes or trees on the path to your home. Use bright outdoor lighting. Clear any walking paths of anything that might make someone trip, such  as rocks or tools. Regularly check to see if handrails are loose or broken. Make sure that both sides of any steps have handrails. Any raised decks and porches should have guardrails on the edges. Have any leaves, snow, or ice cleared regularly. Use sand or salt on walking paths during winter. Clean up any spills in your garage right away. This includes oil or grease spills. What can I do in the bathroom? Use night lights. Install grab bars by the toilet and in the tub and shower. Do not use towel bars as grab bars. Use non-skid mats or decals in the tub or shower. If you need to sit down in the shower, use a plastic, non-slip stool. Keep the floor dry. Clean up any water that spills on the floor as soon as it happens. Remove soap buildup in the tub or shower regularly. Attach bath mats securely with double-sided non-slip rug tape. Do not have throw rugs and other things on the floor that can make you trip. What can I do in the bedroom? Use night lights. Make sure that you have a light by your bed that is easy to reach. Do not use any sheets or blankets that are too big for your bed. They should not hang down onto the floor. Have a firm chair that has side arms. You can use this for support while you get dressed. Do not have throw rugs and other things  on the floor that can make you trip. What can I do in the kitchen? Clean up any spills right away. Avoid walking on wet floors. Keep items that you use a lot in easy-to-reach places. If you need to reach something above you, use a strong step stool that has a grab bar. Keep electrical cords out of the way. Do not use floor polish or wax that makes floors slippery. If you must use wax, use non-skid floor wax. Do not have throw rugs and other things on the floor that can make you trip. What can I do with my stairs? Do not leave any items on the stairs. Make sure that there are handrails on both sides of the stairs and use them. Fix  handrails that are broken or loose. Make sure that handrails are as long as the stairways. Check any carpeting to make sure that it is firmly attached to the stairs. Fix any carpet that is loose or worn. Avoid having throw rugs at the top or bottom of the stairs. If you do have throw rugs, attach them to the floor with carpet tape. Make sure that you have a light switch at the top of the stairs and the bottom of the stairs. If you do not have them, ask someone to add them for you. What else can I do to help prevent falls? Wear shoes that: Do not have high heels. Have rubber bottoms. Are comfortable and fit you well. Are closed at the toe. Do not wear sandals. If you use a stepladder: Make sure that it is fully opened. Do not climb a closed stepladder. Make sure that both sides of the stepladder are locked into place. Ask someone to hold it for you, if possible. Clearly mark and make sure that you can see: Any grab bars or handrails. First and last steps. Where the edge of each step is. Use tools that help you move around (mobility aids) if they are needed. These include: Canes. Walkers. Scooters. Crutches. Turn on the lights when you go into a dark area. Replace any light bulbs as soon as they burn out. Set up your furniture so you have a clear path. Avoid moving your furniture around. If any of your floors are uneven, fix them. If there are any pets around you, be aware of where they are. Review your medicines with your doctor. Some medicines can make you feel dizzy. This can increase your chance of falling. Ask your doctor what other things that you can do to help prevent falls. This information is not intended to replace advice given to you by your health care provider. Make sure you discuss any questions you have with your health care provider. Document Released: 11/21/2008 Document Revised: 07/03/2015 Document Reviewed: 03/01/2014 Elsevier Interactive Patient Education  2017  [employer].

## 2021-03-26 ENCOUNTER — Emergency Department (HOSPITAL_COMMUNITY): Payer: Medicare HMO

## 2021-03-26 ENCOUNTER — Encounter (HOSPITAL_COMMUNITY): Payer: Self-pay

## 2021-03-26 ENCOUNTER — Emergency Department (HOSPITAL_COMMUNITY)
Admission: EM | Admit: 2021-03-26 | Discharge: 2021-03-26 | Disposition: A | Discharge status: Home / Self Care | Payer: Medicare HMO | Attending: Emergency Medicine | Admitting: Emergency Medicine

## 2021-03-26 DIAGNOSIS — M26602 Left temporomandibular joint disorder, unspecified: Secondary | ICD-10-CM | POA: Diagnosis not present

## 2021-03-26 DIAGNOSIS — W1839XA Other fall on same level, initial encounter: Secondary | ICD-10-CM | POA: Insufficient documentation

## 2021-03-26 DIAGNOSIS — M79642 Pain in left hand: Secondary | ICD-10-CM | POA: Diagnosis not present

## 2021-03-26 DIAGNOSIS — Z7951 Long term (current) use of inhaled steroids: Secondary | ICD-10-CM | POA: Diagnosis not present

## 2021-03-26 DIAGNOSIS — W19XXXA Unspecified fall, initial encounter: Secondary | ICD-10-CM

## 2021-03-26 DIAGNOSIS — M2578 Osteophyte, vertebrae: Secondary | ICD-10-CM | POA: Diagnosis not present

## 2021-03-26 DIAGNOSIS — M19032 Primary osteoarthritis, left wrist: Secondary | ICD-10-CM | POA: Diagnosis not present

## 2021-03-26 DIAGNOSIS — S022XXA Fracture of nasal bones, initial encounter for closed fracture: Secondary | ICD-10-CM | POA: Diagnosis not present

## 2021-03-26 DIAGNOSIS — S0993XA Unspecified injury of face, initial encounter: Secondary | ICD-10-CM | POA: Diagnosis present

## 2021-03-26 DIAGNOSIS — R519 Headache, unspecified: Secondary | ICD-10-CM | POA: Diagnosis not present

## 2021-03-26 DIAGNOSIS — M7989 Other specified soft tissue disorders: Secondary | ICD-10-CM | POA: Diagnosis not present

## 2021-03-26 DIAGNOSIS — M19042 Primary osteoarthritis, left hand: Secondary | ICD-10-CM | POA: Diagnosis not present

## 2021-03-26 DIAGNOSIS — M542 Cervicalgia: Secondary | ICD-10-CM | POA: Diagnosis not present

## 2021-03-26 MED ORDER — HYDROCODONE-ACETAMINOPHEN 5-325 MG PO TABS: 2.0000 | ORAL_TABLET | Freq: Four times a day (QID) | ORAL | 0 refills | Status: DC | PRN

## 2021-03-26 MED ORDER — BACITRACIN ZINC 500 UNIT/GM EX OINT
TOPICAL_OINTMENT | Freq: Once | CUTANEOUS | Status: AC
  Administered 2021-03-26: 1 via TOPICAL
  Filled 2021-03-26: qty 0.9

## 2021-03-26 NOTE — ED Provider Notes (Signed)
[facility] DEPT Provider Note   CSN: [id number] Arrival date & time: 03/26/21  1604     History  Chief Complaint  Patient presents with   [name] is a 66 y.o. female who presents to the ED complaining of a fall onset prior to arrival.  Patient notes she was pet sitting when she went to take the recycling can out and she fell due to the gravel driveway.  Patient notes she fell directly onto her face.  Has not tried any medications prior to arrival to the ED.  Has associated wound to nasal bridge with pain, pain to upper lip, pain to left hand.  Denies chest pain, shortness of breath, abdominal pain, nausea, vomiting.  Patient is not on anticoagulants.  Patient unsure of tetanus status.  The history is provided by the patient. No language interpreter was used.      Home Medications Prior to Admission medications   Medication Sig Start Date End Date Taking? Authorizing Provider  acetaminophen (TYLENOL) 500 MG tablet Take 500 mg by mouth every 6 (six) hours as needed.    [provider]  albuterol (VENTOLIN HFA) 108 (90 Base) MCG/ACT inhaler INHALE 1-2 PUFFS INTO THE LUNGS DAILY AS NEEDED FOR WHEEZING OR SHORTNESS OF BREATH. 01/20/21   Valentina Shaggy, MD  Ascorbic Acid (VITAMIN C CR) 500 MG CPCR Take by mouth.    [provider]  Azelastine HCl 0.15 % SOLN Place 1-2 sprays into both nostrils 2 (two) times daily. 04/02/20 12/09/20  Valentina Shaggy, MD  budesonide-formoterol [facility]) 160-4.5 MCG/ACT inhaler Inhale 2 puffs into the lungs in the morning and at bedtime. 12/09/20 03/09/21  Valentina Shaggy, MD  Carbinoxamine Maleate 4 MG TABS TAKE 1 TABLET BY MOUTH EVERY 6 HOURS. 01/07/21   Valentina Shaggy, MD  cariprazine (VRAYLAR) 3 MG capsule     [provider]  Cinnamon 500 MG capsule Cinnamon 500 mg capsule    [provider]  clonazePAM (KLONOPIN) 0.5 MG tablet Take 0.5 mg by mouth 2  (two) times daily as needed. 08/20/20   [provider]  DULoxetine HCl 30 MG CSDR     [provider]  fluticasone (FLONASE) 50 MCG/ACT nasal spray PLACE 1 SPRAY INTO BOTH NOSTRILS 2 (TWO) TIMES DAILY AS NEEDED FOR ALLERGIES OR RHINITIS. 12/01/20   Valentina Shaggy, MD  fluticasone (FLOVENT HFA) 220 MCG/ACT inhaler During respiratory infections or worsening symptoms Inhale 4 puffs 2 times daily for up to TWO WEEKS. Patient not taking: Reported on 03/24/2021 02/04/21   Valentina Shaggy, MD  hydrocortisone 2.5 % ointment Apply topically 2 (two) times daily.    [provider]  ketoconazole (NIZORAL) 2 % cream Apply topically daily.  10/09/18   [provider]  levothyroxine (SYNTHROID) 25 MCG tablet Take 1 tablet (25 mcg total) by mouth daily. 03/09/21   Janith Lima, MD  metoprolol tartrate (LOPRESSOR) 25 MG tablet TAKE 1 TABLET BY MOUTH TWICE A DAY 01/06/21   Janith Lima, MD  montelukast (SINGULAIR) 10 MG tablet TAKE 1 TABLET BY MOUTH EVERYDAY AT BEDTIME 03/04/21   Valentina Shaggy, MD  MYRBETRIQ 50 MG TB24 tablet Take 50 mg by mouth daily. 08/09/20   [provider]  omeprazole (PRILOSEC) 20 MG capsule omeprazole 20 mg capsule,delayed release Patient not taking: Reported on 03/24/2021    [provider]  promethazine (PHENERGAN) 25 MG tablet Take 1 tablet (25 mg  total) by mouth every 6 (six) hours as needed for nausea or vomiting. Patient not taking: Reported on 03/24/2021 11/14/20   Nelida Meuse III, MD  rizatriptan (MAXALT-MLT) 10 MG disintegrating tablet TAKE 1 TABLET EVERY DAY AS NEEDED FOR MIGRAINE, MAY REPEAT IN 2 HRS IF NEEDED. MAX 2 DOSES/WK 01/07/20   Martin[place], Betty G, MD  rosuvastatin (CRESTOR) 20 MG tablet Take 1 tablet (20 mg total) by mouth daily. 03/24/21   Janith Lima, MD  sodium chloride (OCEAN) 0.65 % SOLN nasal spray Place 1 spray into both nostrils as needed for congestion.    [provider]   spironolactone (ALDACTONE) 25 MG tablet Take 1 tablet (25 mg total) by mouth daily. 03/09/21   Janith Lima, MD  trihexyphenidyl (ARTANE) 2 MG tablet Take 2 mg by mouth daily.    [provider]  zinc gluconate 50 MG tablet Take 50 mg by mouth daily.    [provider]  zolpidem (AMBIEN) 10 MG tablet Take 10 mg by mouth at bedtime. 03/17/21   [provider]      Allergies    Emetrol, Indomethacin, Iodinated contrast media, Amlodipine, Carbamazepine, Pseudoephedrine, Risperidone and related, Bupivacaine, Pentazocine, Propoxyphene, Sulfa antibiotics, Tramadol, Aspirin, Codeine, Etodolac, and Propranolol    Review of Systems   Review of Systems  Constitutional:  Negative for chills and fever.  Eyes:  Negative for visual disturbance.  Respiratory:  Negative for shortness of breath.   Cardiovascular:  Negative for chest pain.  Gastrointestinal:  Negative for abdominal pain, nausea and vomiting.  Musculoskeletal:  Positive for arthralgias. Negative for joint swelling.  Skin:  Positive for wound. Negative for color change and rash.  Neurological:  Negative for syncope and headaches.  All other systems reviewed and are negative.  Physical Exam Updated Vital Signs BP (!) 139/93 (BP Location: Right Arm)    Pulse (!) 119    Temp 98.1 F (36.7 C) (Oral)    Resp 17    LMP 03/05/2012 (Approximate)    SpO2 97%  Physical Exam Vitals and nursing note reviewed.  Constitutional:      General: She is not in acute distress.    Appearance: She is not diaphoretic.  HENT:     Head: Normocephalic and atraumatic.     Nose: Signs of injury present. No nasal deformity, septal deviation or laceration.     Right Nostril: No foreign body, epistaxis, septal hematoma or occlusion.     Left Nostril: No foreign body, epistaxis, septal hematoma or occlusion.      Comments: Area of missing skin noted to the right upper nasal bridge, no obvious laceration.  No obvious nasal deformity,  septal deviation noted.  Small abrasion noted to tip of nose.  No septal hematoma noted bilaterally.  No evidence of epistaxis noted to bilateral nares.  No evidence of foreign body noted to bilateral nares.  No frontal or maxillary sinus tenderness to palpation bilaterally.  See picture below.    Mouth/Throat:     Mouth: Mucous membranes are moist. Injury present. No lacerations.     Pharynx: Oropharynx is clear. Uvula midline. No oropharyngeal exudate or uvula swelling.     Comments: Area of ecchymosis noted to upper lip without area of laceration requiring repair.  Patent airway, uvula midline without swelling. Eyes:     General: No scleral icterus.    Conjunctiva/sclera: Conjunctivae normal.  Neck:     Comments: C-collar in place during exam. Cardiovascular:  Rate and Rhythm: Normal rate and regular rhythm.     Pulses: Normal pulses.     Heart sounds: Normal heart sounds.  Pulmonary:     Effort: Pulmonary effort is normal. No respiratory distress.     Breath sounds: Normal breath sounds. No wheezing.  Abdominal:     General: Bowel sounds are normal.     Palpations: Abdomen is soft. There is no mass.     Tenderness: There is no abdominal tenderness. There is no guarding or rebound.  Musculoskeletal:        General: Normal range of motion.     Cervical back: Normal range of motion and neck supple.     Comments: No C, T, L, S spinal tenderness to palpation.  Skin:    General: Skin is warm and dry.  Neurological:     Mental Status: She is alert.  Psychiatric:        Behavior: Behavior normal.      ED Results / Procedures / Treatments   Labs (all labs ordered are listed, but only abnormal results are displayed) Labs Reviewed - No data to display  EKG None  Radiology DG Wrist Complete Left  Result Date: 03/26/2021 CLINICAL DATA:  fall, pain EXAM: LEFT HAND - COMPLETE 3+ VIEW; LEFT WRIST - COMPLETE 3+ VIEW COMPARISON:  None. FINDINGS: Left wrist: Surgical hardware noted  along the first and second metacarpals. There is no evidence of fracture or dislocation. At least mild atherosclerotic plaque degenerative changes of the first carpometacarpal joint. Mild moderate radiocarpal joint degenerative changes. No aggressive appearing focal bone abnormality. Soft tissues are unremarkable. Left hand: Surgical hardware of the fifth digit proximal interphalangeal joint. Severe degenerative changes of the distal interphalangeal joints of first, second, fifth digits. The other interphalangeal joints demonstrate at least mild to moderate degenerative changes. No evidence of fracture, dislocation, or joint effusion. No aggressive appearing focal bone abnormality. Soft tissues are unremarkable. IMPRESSION: No acute displaced fracture or dislocation of the bones of the left hand and wrist. Electronically Signed   By: Iven Finn M.D.   On: 03/26/2021 17:23   CT Head Wo Contrast  Result Date: 03/26/2021 CLINICAL DATA:  Post traumatic headache.  Old down by animal. EXAM: CT HEAD WITHOUT CONTRAST TECHNIQUE: Contiguous axial images were obtained from the base of the skull through the vertex without intravenous contrast. RADIATION DOSE REDUCTION: This exam was performed according to the departmental dose-optimization program which includes automated exposure control, adjustment of the mA and/or kV according to patient size and/or use of iterative reconstruction technique. COMPARISON:  None. FINDINGS: Brain: The brain shows a normal appearance without evidence of malformation, atrophy, old or acute small or large vessel infarction, mass lesion, hemorrhage, hydrocephalus or extra-axial collection. Vascular: There is atherosclerotic calcification of the major vessels at the base of the brain. Skull: Normal.  No traumatic finding.  No focal bone lesion. Sinuses/Orbits: Sinuses are clear. Orbits appear normal. Mastoids are clear. Other: None significant IMPRESSION: Normal head CT.  No traumatic  cranial or intracranial finding. Electronically Signed   By: Nelson Chimes M.D.   On: 03/26/2021 17:27   CT Cervical Spine Wo Contrast  Result Date: 03/26/2021 CLINICAL DATA:  Old down by animal.  Neck pain. EXAM: CT CERVICAL SPINE WITHOUT CONTRAST TECHNIQUE: Multidetector CT imaging of the cervical spine was performed without intravenous contrast. Multiplanar CT image reconstructions were also generated. RADIATION DOSE REDUCTION: This exam was performed according to the departmental dose-optimization program which includes automated exposure  control, adjustment of the mA and/or kV according to patient size and/or use of iterative reconstruction technique. COMPARISON:  None. FINDINGS: Alignment: No traumatic malalignment. Degenerative anterolisthesis at C7-T1 measuring 2 mm. Skull base and vertebrae: No regional fracture or focal bone finding. Soft tissues and spinal canal: Negative Disc levels: Chronic degenerative spondylosis, most notable at C3-4, C5-6 and C6-7. Disc space narrowing with endplate osteophytes. Facet osteoarthritis most notable on the right at C3-4, C4-5 and C7-T1 and on the left at C3-4, C4-5 and C7-T1. No compressive canal stenosis. Bony foraminal encroachment on the right at C3-4, C6-7 and C7-T1 and on the left at C3-4, C5-6, C6-7 and C7-T1. Upper chest: Negative Other: None IMPRESSION: No acute or traumatic finding. Chronic degenerative spondylosis and facet arthritis as outlined above. Electronically Signed   By: Nelson Chimes M.D.   On: 03/26/2021 17:34   DG Hand Complete Left  Result Date: 03/26/2021 CLINICAL DATA:  fall, pain EXAM: LEFT HAND - COMPLETE 3+ VIEW; LEFT WRIST - COMPLETE 3+ VIEW COMPARISON:  None. FINDINGS: Left wrist: Surgical hardware noted along the first and second metacarpals. There is no evidence of fracture or dislocation. At least mild atherosclerotic plaque degenerative changes of the first carpometacarpal joint. Mild moderate radiocarpal joint degenerative  changes. No aggressive appearing focal bone abnormality. Soft tissues are unremarkable. Left hand: Surgical hardware of the fifth digit proximal interphalangeal joint. Severe degenerative changes of the distal interphalangeal joints of first, second, fifth digits. The other interphalangeal joints demonstrate at least mild to moderate degenerative changes. No evidence of fracture, dislocation, or joint effusion. No aggressive appearing focal bone abnormality. Soft tissues are unremarkable. IMPRESSION: No acute displaced fracture or dislocation of the bones of the left hand and wrist. Electronically Signed   By: Iven Finn M.D.   On: 03/26/2021 17:23   CT Maxillofacial WO CM  Result Date: 03/26/2021 CLINICAL DATA:  Old down by animal.  Facial injury. EXAM: CT MAXILLOFACIAL WITHOUT CONTRAST TECHNIQUE: Multidetector CT imaging of the maxillofacial structures was performed. Multiplanar CT image reconstructions were also generated. RADIATION DOSE REDUCTION: This exam was performed according to the departmental dose-optimization program which includes automated exposure control, adjustment of the mA and/or kV according to patient size and/or use of iterative reconstruction technique. COMPARISON:  None. FINDINGS: Osseous: Minor acute nasal fractures. No other acute facial fracture. There is advanced degenerative disease of the temporomandibular joint on the right with evidence of prior surgery in that region. Advanced arthropathy of the left temporomandibular joint as well. Orbits: No evidence of orbital injury. Sinuses: Clear Soft tissues: Soft tissue swelling of the nose. Limited intracranial: Negative IMPRESSION: Minor acute nasal fractures. Chronic arthropathy of the temporomandibular joints, without evidence of previous surgery on the right. Electronically Signed   By: Nelson Chimes M.D.   On: 03/26/2021 17:29    Procedures Procedures   Medications Ordered in ED Medications - No data to display  ED  Course/ Medical Decision Making/ A&P Clinical Course as of 03/26/21 2121  Thu Mar 26, 2021  1722 Attempted to evaluate patient, patient at x-ray at this time. [SB]  1750 Minor nasal fractures otherwise CT scans unremarkable.  X-rays unremarkable.  Can give ulnar gutter splint the hand for comfort.  We will have patient follow-up with her orthopedist that did her hand surgery.  Changed dressing to nose. [SB]  7096 Patient reevaluated prior to discharge and notified of discharge treatment plan.  Patient agreeable this time.  Patient notes that she has taken hydrocodone-acetaminophen before  in the past without any issues.  We will send prescription to patient's pharmacy.  Patient appears safe for discharge. [SB]  1922 Patient notes that she has been given prescriptions for hydrocodone-acetaminophen before in the past with no issues.  Chart review notes that patient most recent prescription of hydrocodone-acetaminophen was in July-August 2022.  Confirmed with patient that she can tolerate Norco.  Patient confirms.  Patient appears safe for discharge. [SB]    Clinical Course User Index [SB] Bosten Newstrom A, PA-C                           Medical Decision Making Risk OTC drugs. Prescription drug management.   Pt with fall occurring today prior to arrival to the ED. Pt is not on anticoagulants. On exam, patient with area of missing skin noticed to right nasal bridge, no obvious laceration.  No obvious nasal deformity or septal deviation noted.  No evidence of epistaxis noted to bilateral nares.  No evidence of foreign body noted.  Small abrasion noted to tip of nose.  Area of ecchymosis noted to inside of upper lip without laceration requiring repair.  C-collar in place during initial exam.  No spinal tenderness to palpation. Per patient chart review: Most recent Tdap was on 08/25/2020.  Imaging: I ordered imaging studies including CT cervical spine, CT maxillofacial, CT head, left hand x-ray, left  wrist x-ray I independently visualized and interpreted imaging which showed: No acute intracranial abnormality.  Minor nasal fractures noted.  No fracture or herniation noted of cervical spine.  No acute fracture or dislocation noted of left hand or wrist. I agree with the radiologist interpretation   Disposition: Patient presentation notable for nasal fractures in the setting of fall.  Doubt fracture, dislocation of hand or wrist. Doubt herniation of cervical spine. After consideration of the diagnostic results and the patients response to treatment, I feel that the patient would benefit from discharge home with wrist splint, short course Norco.  PDMP reviewed.  We will also provide information for the on-call ENT specialist, Dr. Janace Hoard and instructed patient to call to set up a follow-up appointment regarding today's ED visit.  Also instructed patient to call her orthopedist, Dr. Tempie Donning to set up a follow-up appointment.  Supportive care measures and strict return precautions discussed with patient at bedside. Pt acknowledges and verbalizes understanding. Pt appears safe for discharge. Follow up as indicated in discharge paperwork.    This chart was dictated using voice recognition software, Dragon. Despite the best efforts of this provider to proofread and correct errors, errors may still occur which can change documentation meaning.  Final Clinical Impression(s) / ED Diagnoses Final diagnoses:  Fall, initial encounter  Closed fracture of nasal bone, initial encounter  Left hand pain    Rx / DC Orders ED Discharge Orders     None         Lamari Beckles A, PA-C 03/26/21 2122    Dorie Rank, MD 03/26/21 2358

## 2021-03-26 NOTE — ED Provider Triage Note (Signed)
Emergency Medicine Provider Triage Evaluation Note  [name] , a 66 y.o. female  was evaluated in triage.  Pt complains of fall.  She reports that she was pet sitting and the trash can went out and pulled her down.  She reports that she is allergic to all -Caine like lidocaine.    She injured her nose, her left wrist. Broke her left small finger nail and hurts in the hand.  She reports that her elbows and shoulders are ok.  She reports that she has a cut inside her mouth.  Unknown last tdap.    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  Left small finger nail is broken.  Other:  Large avulsion over the nose.  Medical Decision Making  Medically screening exam initiated at 4:26 PM.  Appropriate orders placed.  [name] was informed that the remainder of the evaluation will be completed by another provider, this initial triage assessment does not replace that evaluation, and the importance of remaining in the ED until their evaluation is complete.  Patient reports neck pain, she is diffuse midline tenderness, c-collar is ordered. Will obtain CT scans on head, face, neck, along with x-rays of the left hand and wrist.  She is instructed to remove the rings on her left hand which she did while I was in the room.   Lorin Glass, [place] 03/26/21 1640

## 2021-03-26 NOTE — ED Triage Notes (Signed)
Pt presents with c/o fall that occurred today. Pt reports she fell onto her face in the driveway. Pt has a laceration to her nose across the bridge. Pt is also c/o left wrist pain, a cut on her lip and pain in her chin. Pt denies any LOC.

## 2021-03-26 NOTE — Discharge Instructions (Addendum)
It was a pleasure taking care of you!   Your CT scan of your face showed minor nasal fractures. Otherwise, no acute fracture or dislocation noted on your remaining images.  You will be given a Velcro wrist splint in the ED, you may wear it as needed throughout the day and remove at night.  You may apply ice to the affected areas for up to 15 minutes at a time.  Ensure to place a barrier between your skin and the ice.  Attached is information for the on-call ENT specialist, Dr. Janace Hoard call the office in the morning to set up a follow-up appointment regarding today's ED visit.  Call your orthopedist, Dr. Tempie Donning in the morning to set up a follow-up appointment on today's ED visit.  You may follow-up with your primary care provider as needed.  Return to the ED if you are experiencing increasing/worsening bleeding, drainage, color change.

## 2021-03-28 ENCOUNTER — Other Ambulatory Visit: Payer: Self-pay | Admitting: Allergy & Immunology

## 2021-03-30 DIAGNOSIS — F411 Generalized anxiety disorder: Secondary | ICD-10-CM | POA: Diagnosis not present

## 2021-03-30 DIAGNOSIS — F3132 Bipolar disorder, current episode depressed, moderate: Secondary | ICD-10-CM | POA: Diagnosis not present

## 2021-03-30 DIAGNOSIS — R69 Illness, unspecified: Secondary | ICD-10-CM | POA: Diagnosis not present

## 2021-03-30 DIAGNOSIS — F410 Panic disorder [episodic paroxysmal anxiety] without agoraphobia: Secondary | ICD-10-CM | POA: Diagnosis not present

## 2021-03-31 ENCOUNTER — Ambulatory Visit (INDEPENDENT_AMBULATORY_CARE_PROVIDER_SITE_OTHER): Payer: Medicare HMO | Admitting: Orthopedic Surgery

## 2021-03-31 ENCOUNTER — Other Ambulatory Visit: Payer: Self-pay

## 2021-03-31 DIAGNOSIS — S60222A Contusion of left hand, initial encounter: Secondary | ICD-10-CM | POA: Insufficient documentation

## 2021-03-31 DIAGNOSIS — R2231 Localized swelling, mass and lump, right upper limb: Secondary | ICD-10-CM

## 2021-03-31 NOTE — Progress Notes (Signed)
Office Visit Note   Patient: [name]           Date of Birth: 08/03/1955           MRN: [id number] Visit Date: 03/31/2021              Requested by: Janith Lima, MD [address] [place],  [place] [address] PCP: Janith Lima, MD   Assessment & Plan: Visit Diagnoses:  1. Subcutaneous mass of right forearm   2. Contusion of left hand, initial encounter     Plan: Reviewed the x-rays of the left hand and wrist from her recent ER visit.  There are no apparent fractures or bony instability.  She has ecchymosis of the ulnar aspect of the hand and wrist.  She is wearing a removable brace.  Discussed that she can take tylenol and/or NSAIDs for symptom relief.  I can see her back after the RUE ultrasound is completed to evaluate the forearm mass.   Follow-Up Instructions: No follow-ups on file.   Orders:  Orders Placed This Encounter  Procedures   [place] RT UPPER EXTREM LTD SOFT TISSUE NON VASCULAR   No orders of the defined types were placed in this encounter.     Procedures: No procedures performed   Clinical Data: No additional findings.   Subjective: Chief Complaint  Patient presents with   Left Hand - Pain, Injury    DOI 03/26/20     This is a 66 yo RHD F who presents for ER follow up of left hand pain following a fall on 2/16.  She slipped and fell on a gravel driveway while pet sitting.  She fell landed on her left hand and face.  She has a nasal fracture.  She also describes pain along the ulnar aspect of the hand w/ associated swelling and ecchymosis.   Injury   Review of Systems   Objective: Vital Signs: LMP 03/05/2012 (Approximate)   Physical Exam Constitutional:      Appearance: Normal appearance.  Cardiovascular:     Rate and Rhythm: Normal rate.     Pulses: Normal pulses.  Pulmonary:     Effort: Pulmonary effort is normal.  Skin:    General: Skin is warm and dry.     Capillary Refill: Capillary refill takes less than 2 seconds.   Neurological:     Mental Status: She is alert.    Left Hand Exam   Tenderness  Left hand tenderness location: TTP in ulnar palm and ulnar side of wrist.   Range of Motion  The patient has normal left wrist ROM.  Other  Erythema: absent Sensation: normal Pulse: present  Comments:  Able to make complete fist without evidence of malrotation or finger angulation.  No pain w/ PROM of wrist.  Ecchymosis of ulnar palm and ulnar aspect of wrist.      Specialty Comments:  No specialty comments available.  Imaging: No results found.   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[place]) 07/27/2019  Osteoarthritis of glenohumeral joint 03/26/2019   Iron deficiency anemia 04/28/2018   Stiffness of left knee 12/31/2017   OA (osteoarthritis) of knee 12/26/2017   PVC (premature ventricular contraction) 11/21/2017   Dysphonia 09/26/2017   Vitamin D deficiency, unspecified 10/13/2016   Primary osteoarthritis of left hand 06/30/2016   Tendinitis of flexor tendon of left hand 06/30/2016   Osteoporosis without current pathological fracture 03/22/2016   Paraesophageal hiatal hernia s/p robotic repair & fundoplication 05/22/2438 12/05/2534   Bipolar 1 disorder, mixed, moderate ([place]) 01/29/2016   Class 1 obesity with body mass index (BMI) of 32.0 to 32.9 in adult 01/06/2016   Migraines 11/21/2014   Increased frequency of urination 05/30/2013   OSA on CPAP 01/30/2007    GERD 11/02/2006   DERMATITIS, HANDS 11/02/2006   Hypothyroidism 10/07/2006   Bipolar affective ([place]) 10/07/2006   Essential hypertension 10/07/2006   RHINITIS, CHRONIC 10/07/2006   Seasonal and perennial allergic rhinitis 10/07/2006   Moderate persistent asthma, uncomplicated 64/40/3474   Irritable bowel syndrome 10/07/2006   INTERSTITIAL CYSTITIS 10/07/2006   ROSACEA 10/07/2006   Osteoarthritis of both knees 10/07/2006   Disturbance in sleep behavior 10/07/2006   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   Hypothyroidism    Intermittent palpitations    cardiology--  dr [place]   Interstitial cystitis    Limited jaw range of motion    s/p  bilateral TMJ surgery,  age 49s   Migraines    Moderate persistent asthma    pulmologist-- dr Halford Chessman   OA (osteoarthritis)    both knees   OSA on CPAP    per last sleep study 09/ 2017  mild OSA, AHI13/hr   PONV (postoperative nausea and vomiting)    PVC (premature ventricular contraction)    Rosacea    Sensation of pressure in bladder area    per pt intermittant   TMJ arthralgia    Urticaria    Wears glasses    White coat syndrome without hypertension    05-25-2017  per pt hx hypertension yrs ago, no issues after quitting stressful job    Family History  Problem Relation Age of Onset   Hypertension Mother        deceased from MVA complications   Thyroid disease Mother    Allergic rhinitis Mother    Testicular cancer Father     Allergic rhinitis Father    Colon cancer Sister    Colon polyps Sister    Healthy Sister    Healthy Brother    Coronary artery disease Other    Stomach cancer Neg Hx     Past Surgical History:  Procedure Laterality Date   47 HOUR [place] STUDY N/A 09/22/2015   Procedure: 24 HOUR [place] STUDY;  Surgeon: Doran Stabler, MD;  Location: [facility];  Service: Gastroenterology;  Laterality:  N/A;   ADENOIDECTOMY     ANAL FISSURE REPAIR  age 57  (74)   BREAST EXCISIONAL BIOPSY Right 04-16-2003  dr p. young  [facility]   benign   BUNIONECTOMY Right early 2000s   CARPAL TUNNEL RELEASE Left age 50 (10)   CHOLECYSTECTOMY OPEN  age 38   CYSTO WITH HYDRODISTENSION N/A 06/02/2017   Procedure: CYSTOSCOPY/HYDRODISTENSION OF BLADDER;  Surgeon: Irine Seal, MD;  Location: [facility];  Service: Urology;  Laterality: N/A;   CYSTO/ HYDRODISTENTION/  INSTILLATION THERAPY  1990s   DX LAPAROSCOPY/  DX HYSTEROSCOPY/  D & C  08-07-2007   dr dove  [place]  10-21-2008   dr Harolyn Rutherford  [facility]   ESOPHAGEAL MANOMETRY N/A 09/22/2015   Procedure: ESOPHAGEAL MANOMETRY (EM);  Surgeon: Doran Stabler, MD;  Location: [facility];  Service: Gastroenterology;  Laterality: N/A;  with impedence    FINGER ARTHROPLASTY Bilateral    left little finger 2016:  right middle finger 06/ 2018   KNEE ARTHROSCOPY Bilateral x3 left ;  x3  right-- last one age 73 (35)   NASAL SEPTUM SURGERY  age 37s   ROBOT ASSISTED REDUCTION PARAESOPHAGEAL HIATAL HERNIA/ TYPE 2 MEDIASTINAL DISSECTION/ PRIMARY HIATAL HERNIA REPAIR/ ANTERIOR & POSTERIOR GASTROPEXY/ NISSEN FUNDOPLICATION  [id number]   DR GROSS  [facility]   SINOSCOPY     TEMPOROMANDIBULAR JOINT SURGERY Bilateral x3  -- age 48s   per pt used graft   TONSILLECTOMY     TOTAL KNEE ARTHROPLASTY Left 12/26/2017   Procedure: LEFT TOTAL KNEE ARTHROPLASTY;  Surgeon: Gaynelle Arabian, MD;  Location: [facility];  Service: Orthopedics;  Laterality: Left;  80min    TRANSTHORACIC ECHOCARDIOGRAM  12-06-2017   dr [place]   ef 55-60%, grade 1 diastoilc dysfunction, trivial MR   TRIGGER FINGER RELEASE Bilateral last one 2017   several release's bilaterally   ULNAR NERVE TRANSPOSITION Bilateral age 5 (64)   Social History   Occupational History   Occupation: disability  Tobacco Use   Smoking status: Never   Smokeless tobacco: Never  Vaping Use   Vaping Use: Never used  Substance and Sexual Activity   Alcohol use: Yes    Alcohol/week: 0.0 standard drinks    Comment: once every 3-4 months   Drug use: No   Sexual activity: Not on file

## 2021-04-01 ENCOUNTER — Other Ambulatory Visit: Payer: Self-pay | Admitting: Internal Medicine

## 2021-04-01 DIAGNOSIS — F411 Generalized anxiety disorder: Secondary | ICD-10-CM | POA: Diagnosis not present

## 2021-04-01 DIAGNOSIS — R69 Illness, unspecified: Secondary | ICD-10-CM | POA: Diagnosis not present

## 2021-04-01 DIAGNOSIS — I10 Essential (primary) hypertension: Secondary | ICD-10-CM

## 2021-04-01 DIAGNOSIS — I493 Ventricular premature depolarization: Secondary | ICD-10-CM

## 2021-04-03 DIAGNOSIS — S022XXD Fracture of nasal bones, subsequent encounter for fracture with routine healing: Secondary | ICD-10-CM | POA: Diagnosis not present

## 2021-04-03 DIAGNOSIS — S0120XA Unspecified open wound of nose, initial encounter: Secondary | ICD-10-CM | POA: Diagnosis not present

## 2021-04-10 ENCOUNTER — Ambulatory Visit
Admission: RE | Admit: 2021-04-10 | Discharge: 2021-04-10 | Disposition: A | Discharge status: Home / Self Care | Payer: Medicare HMO | Source: Ambulatory Visit | Attending: Orthopedic Surgery | Admitting: Orthopedic Surgery

## 2021-04-10 DIAGNOSIS — R2231 Localized swelling, mass and lump, right upper limb: Secondary | ICD-10-CM | POA: Diagnosis not present

## 2021-04-14 ENCOUNTER — Other Ambulatory Visit: Payer: Self-pay

## 2021-04-14 ENCOUNTER — Ambulatory Visit: Payer: Medicare HMO | Admitting: Orthopedic Surgery

## 2021-04-14 ENCOUNTER — Ambulatory Visit
Admission: RE | Admit: 2021-04-14 | Discharge: 2021-04-14 | Disposition: A | Discharge status: Home / Self Care | Payer: Medicare HMO | Source: Ambulatory Visit | Attending: Internal Medicine | Admitting: Internal Medicine

## 2021-04-14 DIAGNOSIS — R2231 Localized swelling, mass and lump, right upper limb: Secondary | ICD-10-CM

## 2021-04-14 DIAGNOSIS — Z1231 Encounter for screening mammogram for malignant neoplasm of breast: Secondary | ICD-10-CM

## 2021-04-14 NOTE — Progress Notes (Signed)
Office Visit Note   Patient: [name]           Date of Birth: 09-02-1955           MRN: [id number] Visit Date: 04/14/2021              Requested by: Janith Lima, MD [address] [place],  [place] [address] PCP: Janith Lima, MD   Assessment & Plan: Visit Diagnoses:  1. Subcutaneous mass of right forearm     Plan: Patient presents today for discussion of ultrasound results.  We reviewed these results which suggest possible dystrophic calcification from prior trauma or fat necrosis.  There is not any internal vascularity suggestive of a growing lesion.  We discussed continued observation versus excisional biopsy.  Patient wants to continue to monitor the lesion for now.  Follow-Up Instructions: No follow-ups on file.   Orders:  No orders of the defined types were placed in this encounter.  No orders of the defined types were placed in this encounter.     Procedures: No procedures performed   Clinical Data: No additional findings.   Subjective: Chief Complaint  Patient presents with   Right Wrist - Follow-up    This is a 67 year old right-hand-dominant female who presents with an incidentally noted mass at the dorsal aspect of the right proximal forearm.  She presents today for review of her ultrasound.  Ultrasound was notable for a subcutaneous mass without internal vascularity that could be related to prior trauma.  Denies has been unchanged since her last visit.  She denies any new symptoms today.  Her left wrist pain from her recent fall is much improved today.   Review of Systems   Objective: Vital Signs: LMP 03/05/2012 (Approximate)   Physical Exam  Right Hand Exam   Tenderness  The patient is experiencing no tenderness.   Other  Erythema: absent Sensation: normal Pulse: present  Comments:  Irregularly shaped, firm, mobile mass at dorsal aspect of proximal forearm along subcutaneous border of the ulna.  No surrounding erythema or  induration.  Does not appear to be adherent to underlying tissues.      Specialty Comments:  No specialty comments available.  Imaging: MM 3D SCREEN BREAST BILATERAL  Result Date: 04/14/2021 CLINICAL DATA:  Screening. EXAM: DIGITAL SCREENING BILATERAL MAMMOGRAM WITH TOMOSYNTHESIS AND CAD TECHNIQUE: Bilateral screening digital craniocaudal and mediolateral oblique mammograms were obtained. Bilateral screening digital breast tomosynthesis was performed. The images were evaluated with computer-aided detection. COMPARISON:  Previous exam(s). ACR Breast Density Category c: The breast tissue is heterogeneously dense, which may obscure small masses. FINDINGS: There are no findings suspicious for malignancy. IMPRESSION: No mammographic evidence of malignancy. A result letter of this screening mammogram will be mailed directly to the patient. RECOMMENDATION: Screening mammogram in one year. (Code:SM-B-01Y) BI-RADS CATEGORY  1: Negative. Electronically Signed   By: Kristopher Oppenheim M.D.   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/27/2019   Osteoarthritis  of glenohumeral joint 03/26/2019   Iron deficiency anemia 04/28/2018   Stiffness of left knee 12/31/2017   OA (osteoarthritis) of knee 12/26/2017   PVC (premature ventricular contraction) 11/21/2017   Dysphonia 09/26/2017   Vitamin D deficiency, unspecified 10/13/2016   Primary osteoarthritis of  left hand 06/30/2016   Tendinitis of flexor tendon of left hand 06/30/2016   Osteoporosis without current pathological fracture 03/22/2016   Paraesophageal hiatal hernia s/p robotic repair & fundoplication 3/76/2831 51/76/1607   Bipolar 1 disorder, mixed, moderate ([place]) 01/29/2016   Class 1 obesity with body mass index (BMI) of 32.0 to 32.9 in adult 01/06/2016   Migraines 11/21/2014   Increased frequency of urination 05/30/2013   OSA on CPAP 01/30/2007   GERD 11/02/2006   DERMATITIS, HANDS 11/02/2006   Hypothyroidism 10/07/2006   Bipolar affective ([place]) 10/07/2006   Essential hypertension 10/07/2006   RHINITIS, CHRONIC 10/07/2006   Seasonal and perennial allergic rhinitis 10/07/2006   Moderate persistent asthma, uncomplicated 37/11/6267   Irritable bowel syndrome 10/07/2006   INTERSTITIAL CYSTITIS 10/07/2006   ROSACEA 10/07/2006   Osteoarthritis of both knees 10/07/2006   Disturbance in sleep behavior 10/07/2006   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   Hypothyroidism    Intermittent palpitations    cardiology--  dr [place]   Interstitial cystitis    Limited jaw range of motion    s/p  bilateral TMJ surgery,  age 83s   Migraines    Moderate persistent asthma    pulmologist-- dr Halford Chessman   OA (osteoarthritis)    both knees   OSA on CPAP    per last sleep study 09/ 2017  mild OSA, AHI13/hr   PONV (postoperative nausea and vomiting)    PVC (premature ventricular  contraction)    Rosacea    Sensation of pressure in bladder area    per pt intermittant   TMJ arthralgia    Urticaria    Wears glasses    White coat syndrome without hypertension    05-25-2017  per pt hx hypertension yrs ago, no issues after quitting stressful job    Family History  Problem Relation Age of Onset   Hypertension Mother        deceased from MVA complications   Thyroid disease Mother    Allergic rhinitis Mother    Testicular cancer Father    Allergic rhinitis Father    Colon cancer Sister    Colon polyps Sister    Healthy Sister    Healthy Brother    Coronary artery disease Other    Stomach cancer Neg Hx     Past Surgical History:  Procedure Laterality Date   74 HOUR [place] STUDY N/A 09/22/2015   Procedure: 24 HOUR [place] STUDY;  Surgeon: Doran Stabler, MD;  Location: [facility];  Service: Gastroenterology;  Laterality: N/A;  ADENOIDECTOMY     ANAL FISSURE REPAIR  age 72  (57)   BREAST EXCISIONAL BIOPSY Right 04-16-2003  dr p. young  [facility]   benign   BUNIONECTOMY Right early 2000s   CARPAL TUNNEL RELEASE Left age 55 (62)   CHOLECYSTECTOMY OPEN  age 54   CYSTO WITH HYDRODISTENSION N/A 06/02/2017   Procedure: CYSTOSCOPY/HYDRODISTENSION OF BLADDER;  Surgeon: Irine Seal, MD;  Location: [facility];  Service: Urology;  Laterality: N/A;   CYSTO/ HYDRODISTENTION/  INSTILLATION THERAPY  1990s   DX LAPAROSCOPY/  DX HYSTEROSCOPY/  D & C  08-07-2007   dr dove  [place]  10-21-2008   dr Harolyn Rutherford  [facility]   ESOPHAGEAL MANOMETRY N/A 09/22/2015   Procedure: ESOPHAGEAL MANOMETRY (EM);  Surgeon: Doran Stabler, MD;  Location: [facility];  Service: Gastroenterology;  Laterality: N/A;  with impedence    FINGER ARTHROPLASTY Bilateral    left little finger 2016:  right middle finger 06/ 2018   KNEE ARTHROSCOPY Bilateral x3 left ;  x3  right-- last one age 18 (59)   NASAL SEPTUM SURGERY  age 76s   ROBOT ASSISTED REDUCTION  PARAESOPHAGEAL HIATAL HERNIA/ TYPE 2 MEDIASTINAL DISSECTION/ PRIMARY HIATAL HERNIA REPAIR/ ANTERIOR & POSTERIOR GASTROPEXY/ NISSEN FUNDOPLICATION  [id number]   DR GROSS  [facility]   SINOSCOPY     TEMPOROMANDIBULAR JOINT SURGERY Bilateral x3  -- age 65s   per pt used graft   TONSILLECTOMY     TOTAL KNEE ARTHROPLASTY Left 12/26/2017   Procedure: LEFT TOTAL KNEE ARTHROPLASTY;  Surgeon: Gaynelle Arabian, MD;  Location: [facility];  Service: Orthopedics;  Laterality: Left;  4mn   TRANSTHORACIC ECHOCARDIOGRAM  12-06-2017   dr [place]  ef 55-60%, grade 1 diastoilc dysfunction, trivial MR   TRIGGER FINGER RELEASE Bilateral last one 2017   several release's bilaterally   ULNAR NERVE TRANSPOSITION Bilateral age 66((69   Social History   Occupational History   Occupation: disability  Tobacco Use   Smoking status: Never   Smokeless tobacco: Never  Vaping Use   Vaping Use: Never used  Substance and Sexual Activity   Alcohol use: Yes    Alcohol/week: 0.0 standard drinks    Comment: once every 3-4 months   Drug use: No   Sexual activity: Not on file

## 2021-04-22 DIAGNOSIS — G4733 Obstructive sleep apnea (adult) (pediatric): Secondary | ICD-10-CM | POA: Diagnosis not present

## 2021-04-27 ENCOUNTER — Other Ambulatory Visit: Payer: Self-pay | Admitting: Internal Medicine

## 2021-04-27 DIAGNOSIS — I5189 Other ill-defined heart diseases: Secondary | ICD-10-CM

## 2021-04-27 DIAGNOSIS — I10 Essential (primary) hypertension: Secondary | ICD-10-CM

## 2021-04-29 ENCOUNTER — Ambulatory Visit: Payer: Medicare HMO | Admitting: Internal Medicine

## 2021-04-29 DIAGNOSIS — F411 Generalized anxiety disorder: Secondary | ICD-10-CM | POA: Diagnosis not present

## 2021-04-29 DIAGNOSIS — F3132 Bipolar disorder, current episode depressed, moderate: Secondary | ICD-10-CM | POA: Diagnosis not present

## 2021-04-29 DIAGNOSIS — R69 Illness, unspecified: Secondary | ICD-10-CM | POA: Diagnosis not present

## 2021-04-29 DIAGNOSIS — F410 Panic disorder [episodic paroxysmal anxiety] without agoraphobia: Secondary | ICD-10-CM | POA: Diagnosis not present

## 2021-05-05 DIAGNOSIS — R69 Illness, unspecified: Secondary | ICD-10-CM | POA: Diagnosis not present

## 2021-05-05 DIAGNOSIS — F422 Mixed obsessional thoughts and acts: Secondary | ICD-10-CM | POA: Diagnosis not present

## 2021-05-06 ENCOUNTER — Other Ambulatory Visit (HOSPITAL_COMMUNITY)
Admission: RE | Admit: 2021-05-06 | Discharge: 2021-05-06 | Disposition: A | Discharge status: Home / Self Care | Payer: Medicare HMO | Source: Ambulatory Visit | Attending: Family Medicine | Admitting: Family Medicine

## 2021-05-06 ENCOUNTER — Ambulatory Visit (INDEPENDENT_AMBULATORY_CARE_PROVIDER_SITE_OTHER): Payer: Medicare HMO | Admitting: Family Medicine

## 2021-05-06 ENCOUNTER — Encounter: Payer: Self-pay | Admitting: Family Medicine

## 2021-05-06 VITALS — BP 124/77 | HR 78 | Ht 60.0 in | Wt 156.1 lb

## 2021-05-06 DIAGNOSIS — Z01419 Encounter for gynecological examination (general) (routine) without abnormal findings: Secondary | ICD-10-CM | POA: Diagnosis not present

## 2021-05-06 DIAGNOSIS — Z1151 Encounter for screening for human papillomavirus (HPV): Secondary | ICD-10-CM | POA: Insufficient documentation

## 2021-05-06 DIAGNOSIS — Z124 Encounter for screening for malignant neoplasm of cervix: Secondary | ICD-10-CM

## 2021-05-06 DIAGNOSIS — R69 Illness, unspecified: Secondary | ICD-10-CM | POA: Diagnosis not present

## 2021-05-06 NOTE — Progress Notes (Signed)
Patient presents as a new patient [facility]. Complains of having a rash on her left groin which she uses ketoconazole cream for. ? ?Patient states that her last Pap was about 2-3 years ago.  ?Last MM: Jan/Feb 2023 ?Last colonoscopy: not sure when she had the last one. ?

## 2021-05-06 NOTE — Progress Notes (Signed)
? ? ?GYNECOLOGY ANNUAL PREVENTATIVE CARE ENCOUNTER NOTE ? ?History:    ? [name] is a 66 y.o. G0P0 female here for a routine annual gynecologic exam.  Current complaints: evaluation of her left nipple.  Denies abnormal vaginal bleeding, discharge, pelvic pain, or other gynecologic concerns. Not sexually active. Does not want STD screening.  ?  ?Gynecologic History ?Patient's last menstrual period was 03/05/2012 (approximate). ?Contraception: post menopausal status ?Last Pap: 10/2017. Results were: normal with negative HPV ?Last mammogram: 04/2021. Results were: normal ? ?Obstetric History ?OB History  ?Gravida Para Term Preterm AB Living  ?0            ?SAB IAB Ectopic Multiple Live Births  ?           ? ? ?Past Medical History:  ?Diagnosis Date  ? Allergic rhinitis   ? Anxiety   ? Benign essential tremor   ? hands  ? Bipolar 1 disorder, mixed, moderate (HCC)   ? Claustrophobia   ? Depression   ? Eczema   ? Frequency of urination   ? History of frequent urinary tract infections   ? History of gastroesophageal reflux (GERD)   ? 05-25-2017  per pt no issues since hiatal hernia repair 01/ 2018  ? History of hiatal hernia   ? History of melanoma excision   ? early 2000s-- BACK  ? History of panic attacks   ? History of squamous cell carcinoma excision 2004  ? left ear  ? Hypothyroidism   ? Intermittent palpitations   ? cardiology--  dr Martinique  ? Interstitial cystitis   ? Limited jaw range of motion   ? s/p  bilateral TMJ surgery,  age 8s  ? Migraines   ? Moderate persistent asthma   ? pulmologist-- dr Halford Chessman  ? OA (osteoarthritis)   ? both knees  ? OSA on CPAP   ? per last sleep study 09/ 2017  mild OSA, AHI13/hr  ? PONV (postoperative nausea and vomiting)   ? PVC (premature ventricular contraction)   ? Rosacea   ? Sensation of pressure in bladder area   ? per pt intermittant  ? TMJ arthralgia   ? Urticaria   ? Wears glasses   ? White coat syndrome without hypertension   ? 05-25-2017  per pt hx hypertension yrs  ago, no issues after quitting stressful job  ? ? ?Past Surgical History:  ?Procedure Laterality Date  ? 89 HOUR [place] STUDY N/A 09/22/2015  ? Procedure: [place] STUDY;  Surgeon: Doran Stabler, MD;  Location: [facility];  Service: Gastroenterology;  Laterality: N/A;  ? ADENOIDECTOMY    ? ANAL FISSURE REPAIR  age 45  (65)  ? BREAST EXCISIONAL BIOPSY Right 04-16-2003  dr p. young  [place]  ? benign  ? BUNIONECTOMY Right early 2000s  ? CARPAL TUNNEL RELEASE Left age 66 (37)  ? CHOLECYSTECTOMY OPEN  age 63  ? CYSTO WITH HYDRODISTENSION N/A 06/02/2017  ? Procedure: CYSTOSCOPY/HYDRODISTENSION OF BLADDER;  Surgeon: Irine Seal, MD;  Location: [facility];  Service: Urology;  Laterality: N/A;  ? CYSTO/ HYDRODISTENTION/  INSTILLATION THERAPY  1990s  ? DX LAPAROSCOPY/  DX HYSTEROSCOPY/  D & C  08-07-2007   dr dove  [facility]  ? ENDOMETRIAL ABLATION W/ NOVASURE  10-21-2008   dr anyanwu  [place]  ? ESOPHAGEAL MANOMETRY N/A 09/22/2015  ? Procedure: ESOPHAGEAL MANOMETRY (EM);  Surgeon: Doran Stabler, MD;  Location: [facility];  Service: Gastroenterology;  Laterality: N/A;  with impedence   ? FINGER ARTHROPLASTY Bilateral   ? left little finger 2016:  right middle finger 06/ 2018  ? KNEE ARTHROSCOPY Bilateral x3 left ;  x3  right-- last one age 89 (1)  ? NASAL SEPTUM SURGERY  age 70s  ? ROBOT ASSISTED REDUCTION PARAESOPHAGEAL HIATAL HERNIA/ TYPE 2 MEDIASTINAL DISSECTION/ PRIMARY HIATAL HERNIA REPAIR/ ANTERIOR & POSTERIOR GASTROPEXY/ NISSEN FUNDOPLICATION  [id number]   DR GROSS  [facility]  ? SINOSCOPY    ? TEMPOROMANDIBULAR JOINT SURGERY Bilateral x3  -- age 13s  ? per pt used graft  ? TONSILLECTOMY    ? TOTAL KNEE ARTHROPLASTY Left 12/26/2017  ? Procedure: LEFT TOTAL KNEE ARTHROPLASTY;  Surgeon: Gaynelle Arabian, MD;  Location: [facility];  Service: Orthopedics;  Laterality: Left;  33mn  ? TRANSTHORACIC ECHOCARDIOGRAM  12-06-2017   dr jMartinique ? ef 55-60%, grade 1 diastoilc dysfunction, trivial MR  ? TRIGGER FINGER RELEASE  Bilateral last one 2017  ? several release's bilaterally  ? ULNAR NERVE TRANSPOSITION Bilateral age 66((77  ? ? ?Current Outpatient Medications on File Prior to Visit  ?Medication Sig Dispense Refill  ? acetaminophen (TYLENOL) 500 MG tablet Take 500 mg by mouth every 6 (six) hours as needed.    ? albuterol (VENTOLIN HFA) 108 (90 Base) MCG/ACT inhaler INHALE 1-2 PUFFS INTO THE LUNGS DAILY AS NEEDED FOR WHEEZING OR SHORTNESS OF BREATH. 18 each 1  ? Ascorbic Acid (VITAMIN C CR) 500 MG CPCR Take by mouth.    ? Carbinoxamine Maleate 4 MG TABS TAKE 1 TABLET BY MOUTH EVERY 6 HOURS. 180 tablet 1  ? cariprazine (VRAYLAR) 3 MG capsule     ? Cinnamon 500 MG capsule Cinnamon 500 mg capsule    ? clonazePAM (KLONOPIN) 0.5 MG tablet Take 0.5 mg by mouth 2 (two) times daily as needed.    ? DULoxetine (CYMBALTA) 60 MG capsule Take 60 mg by mouth 2 (two) times daily at 10 AM and 5 PM.    ? DULoxetine HCl 30 MG CSDR     ? fluticasone (FLONASE) 50 MCG/ACT nasal spray PLACE 1 SPRAY INTO BOTH NOSTRILS 2 (TWO) TIMES DAILY AS NEEDED FOR ALLERGIES OR RHINITIS. 48 mL 1  ? fluticasone (FLOVENT HFA) 220 MCG/ACT inhaler During respiratory infections or worsening symptoms Inhale 4 puffs 2 times daily for up to TWO WEEKS. 12 each 2  ? hydrocortisone 2.5 % ointment Apply topically 2 (two) times daily.    ? ketoconazole (NIZORAL) 2 % cream Apply topically daily.     ? levothyroxine (SYNTHROID) 25 MCG tablet Take 1 tablet (25 mcg total) by mouth daily. 90 tablet 0  ? montelukast (SINGULAIR) 10 MG tablet TAKE 1 TABLET BY MOUTH EVERYDAY AT BEDTIME 90 tablet 1  ? MYRBETRIQ 50 MG TB24 tablet Take 50 mg by mouth daily.    ? promethazine (PHENERGAN) 25 MG tablet Take 1 tablet (25 mg total) by mouth every 6 (six) hours as needed for nausea or vomiting. 45 tablet 2  ? rizatriptan (MAXALT-MLT) 10 MG disintegrating tablet TAKE 1 TABLET EVERY DAY AS NEEDED FOR MIGRAINE, MAY REPEAT IN 2 HRS IF NEEDED. MAX 2 DOSES/WK 9 tablet 2  ? sodium chloride (OCEAN) 0.65  % SOLN nasal spray Place 1 spray into both nostrils as needed for congestion.    ? spironolactone (ALDACTONE) 25 MG tablet Take 1 tablet (25 mg total) by mouth daily. 90 tablet 0  ? trihexyphenidyl (ARTANE) 2 MG tablet Take 2 mg by mouth  daily.    ? zinc gluconate 50 MG tablet Take 50 mg by mouth daily.    ? zolpidem (AMBIEN) 10 MG tablet Take 10 mg by mouth at bedtime.    ? Azelastine HCl 0.15 % SOLN Place 1-2 sprays into both nostrils 2 (two) times daily. 12 mL 0  ? budesonide-formoterol (SYMBICORT) 160-4.5 MCG/ACT inhaler Inhale 2 puffs into the lungs in the morning and at bedtime. 3 each 5  ? metoprolol tartrate (LOPRESSOR) 25 MG tablet TAKE 1 TABLET BY MOUTH TWICE A DAY 180 tablet 0  ? rosuvastatin (CRESTOR) 20 MG tablet Take 1 tablet (20 mg total) by mouth daily. (Patient not taking: Reported on 05/06/2021) 90 tablet 0  ? ?No current facility-administered medications on file prior to visit.  ? ? ?Allergies  ?Allergen Reactions  ? Emetrol Itching  ? Indomethacin Other (See Comments)  ?  Muscle spasms ?Causes muscle spasms in neck  ? Iodinated Contrast Media Itching  ?  Flushed and Fever, itch all over  ? Amlodipine Swelling  ?  Peripheral edema  ? Carbamazepine Other (See Comments)  ?  Easy and unusual bleeding ?  ? Pseudoephedrine Other (See Comments)  ?  I fly off the walls  ?CANNOT SLEEP  ? Risperidone And Related Other (See Comments)  ?  Stomach upset, insomnia, drooling, tremors, "jerks," sensitivity to touch.   ? Bupivacaine Hives  ? Pentazocine Other (See Comments)  ?  Unknown reaction ?MADE ME "LACTATE"  ? Propoxyphene Itching and Nausea And Vomiting  ?  darvocet  ? Sulfa Antibiotics Other (See Comments)  ?  Unknown reaction  ? Tramadol Other (See Comments)  ?  Patient can not remember  ? Aspirin Other (See Comments)  ?  upset stomach, ringing in the ears  ? Codeine Itching and Other (See Comments)  ?  Anything related to codeine  ? Etodolac Rash and Other (See Comments)  ? Propranolol Nausea Only and  Other (See Comments)  ? ? ?Social History:  reports that she has never smoked. She has never used smokeless tobacco. She reports current alcohol use. She reports that she does not use drugs. ? ?Family Histo

## 2021-05-10 LAB — CYTOLOGY - PAP
Comment: NEGATIVE
Diagnosis: NEGATIVE
High risk HPV: NEGATIVE

## 2021-05-12 ENCOUNTER — Other Ambulatory Visit: Payer: Self-pay | Admitting: Family Medicine

## 2021-05-12 ENCOUNTER — Other Ambulatory Visit: Payer: Self-pay | Admitting: Allergy & Immunology

## 2021-05-12 ENCOUNTER — Other Ambulatory Visit: Payer: Self-pay | Admitting: Internal Medicine

## 2021-05-12 DIAGNOSIS — E876 Hypokalemia: Secondary | ICD-10-CM

## 2021-05-12 DIAGNOSIS — G43809 Other migraine, not intractable, without status migrainosus: Secondary | ICD-10-CM

## 2021-05-15 DIAGNOSIS — M25562 Pain in left knee: Secondary | ICD-10-CM | POA: Diagnosis not present

## 2021-05-19 ENCOUNTER — Other Ambulatory Visit: Payer: Self-pay | Admitting: Internal Medicine

## 2021-05-19 DIAGNOSIS — E876 Hypokalemia: Secondary | ICD-10-CM

## 2021-05-23 DIAGNOSIS — G4733 Obstructive sleep apnea (adult) (pediatric): Secondary | ICD-10-CM | POA: Diagnosis not present

## 2021-05-25 DIAGNOSIS — R490 Dysphonia: Secondary | ICD-10-CM | POA: Diagnosis not present

## 2021-05-25 DIAGNOSIS — K219 Gastro-esophageal reflux disease without esophagitis: Secondary | ICD-10-CM | POA: Diagnosis not present

## 2021-05-28 DIAGNOSIS — R69 Illness, unspecified: Secondary | ICD-10-CM | POA: Diagnosis not present

## 2021-05-28 DIAGNOSIS — F422 Mixed obsessional thoughts and acts: Secondary | ICD-10-CM | POA: Diagnosis not present

## 2021-06-02 ENCOUNTER — Other Ambulatory Visit: Payer: Self-pay | Admitting: Allergy & Immunology

## 2021-06-03 ENCOUNTER — Encounter: Payer: Self-pay | Admitting: Internal Medicine

## 2021-06-03 ENCOUNTER — Ambulatory Visit (INDEPENDENT_AMBULATORY_CARE_PROVIDER_SITE_OTHER): Payer: Medicare HMO | Admitting: Internal Medicine

## 2021-06-03 VITALS — BP 128/74 | HR 87 | Temp 97.9°F | Ht 60.0 in | Wt 152.0 lb

## 2021-06-03 DIAGNOSIS — G2111 Neuroleptic induced parkinsonism: Secondary | ICD-10-CM | POA: Diagnosis not present

## 2021-06-03 DIAGNOSIS — I10 Essential (primary) hypertension: Secondary | ICD-10-CM

## 2021-06-03 DIAGNOSIS — R69 Illness, unspecified: Secondary | ICD-10-CM | POA: Diagnosis not present

## 2021-06-03 DIAGNOSIS — E785 Hyperlipidemia, unspecified: Secondary | ICD-10-CM

## 2021-06-03 DIAGNOSIS — E876 Hypokalemia: Secondary | ICD-10-CM

## 2021-06-03 DIAGNOSIS — E039 Hypothyroidism, unspecified: Secondary | ICD-10-CM | POA: Diagnosis not present

## 2021-06-03 DIAGNOSIS — F3111 Bipolar disorder, current episode manic without psychotic features, mild: Secondary | ICD-10-CM

## 2021-06-03 LAB — BASIC METABOLIC PANEL
BUN: 11 mg/dL (ref 6–23)
CO2: 28 mEq/L (ref 19–32)
Calcium: 9.5 mg/dL (ref 8.4–10.5)
Chloride: 101 mEq/L (ref 96–112)
Creatinine, Ser: 0.98 mg/dL (ref 0.40–1.20)
GFR: 60.97 mL/min (ref >60.00)
Glucose, Bld: 124 mg/dL — ABNORMAL HIGH (ref 70–99)
Potassium: 3.7 mEq/L (ref 3.5–5.1)
Sodium: 138 mEq/L (ref 135–145)

## 2021-06-03 LAB — MAGNESIUM: Magnesium: 1.8 mg/dL (ref 1.5–2.5)

## 2021-06-03 LAB — TSH: TSH: 0.84 u[IU]/mL (ref 0.35–5.50)

## 2021-06-03 MED ORDER — ROSUVASTATIN CALCIUM 20 MG PO TABS: 20.0000 mg | ORAL_TABLET | Freq: Every day | ORAL | 0 refills | Status: DC

## 2021-06-03 MED ORDER — TRIHEXYPHENIDYL HCL 2 MG PO TABS: 2.0000 mg | ORAL_TABLET | Freq: Every day | ORAL | 1 refills | Status: DC

## 2021-06-03 MED ORDER — LEVOTHYROXINE SODIUM 25 MCG PO TABS: 25.0000 ug | ORAL_TABLET | Freq: Every day | ORAL | 1 refills | Status: DC

## 2021-06-03 NOTE — Patient Instructions (Signed)
Hypokalemia Hypokalemia means that the amount of potassium in the blood is lower than normal. Potassium is a mineral (electrolyte) that helps regulate the amount of fluid in the body. It also stimulates muscle tightening (contraction) and helps nerves work properly. Normally, most of the body's potassium is inside cells, and only a very small amount is in the blood. Because the amount in the blood is so small, minor changes to potassium levels in the blood can be life-threatening. What are the causes? This condition may be caused by: Antibiotic medicine. Diarrhea or vomiting. Taking too much of a medicine that helps you have a bowel movement (laxative) can cause diarrhea and lead to hypokalemia. Chronic kidney disease (CKD). Medicines that help the body get rid of excess fluid (diuretics). Eating disorders, such as anorexia or bulimia. Low magnesium levels in the body. Sweating a lot. What are the signs or symptoms? Symptoms of this condition include: Weakness. Constipation. Fatigue. Muscle cramps. Mental confusion. Skipped heartbeats or irregular heartbeat (palpitations). Tingling or numbness. How is this diagnosed? This condition is diagnosed with a blood test. How is this treated? This condition may be treated by: Taking potassium supplements. Adjusting the medicines that you take. Eating more foods that contain a lot of potassium. If your potassium level is very low, you may need to get potassium through an IV and be monitored in the hospital. Follow these instructions at home: Eating and drinking  Eat a healthy diet. A healthy diet includes fresh fruits and vegetables, whole grains, healthy fats, and lean proteins. If told, eat more foods that contain a lot of potassium. These include: Nuts, such as peanuts and pistachios. Seeds, such as sunflower seeds and pumpkin seeds. Peas, lentils, and lima beans. Whole grain and bran cereals and breads. Fresh fruits and vegetables,  such as apricots, avocado, bananas, cantaloupe, kiwi, oranges, tomatoes, asparagus, and potatoes. Juices, such as orange, tomato, and prune. Lean meats, including fish. Milk and milk products, such as yogurt. General instructions Take over-the-counter and prescription medicines only as told by your health care provider. This includes vitamins, natural food products, and supplements. Keep all follow-up visits. This is important. Contact a health care provider if: You have weakness that gets worse. You feel your heart pounding or racing. You vomit. You have diarrhea. You have diabetes and you have trouble keeping your blood sugar in your target range. Get help right away if: You have chest pain. You have shortness of breath. You have vomiting or diarrhea that lasts for more than 2 days. You faint. These symptoms may be an emergency. Get help right away. Call 911. Do not wait to see if the symptoms will go away. Do not drive yourself to the hospital. Summary Hypokalemia means that the amount of potassium in the blood is lower than normal. This condition is diagnosed with a blood test. Hypokalemia may be treated by taking potassium supplements, adjusting the medicines that you take, or eating more foods that are high in potassium. If your potassium level is very low, you may need to get potassium through an IV and be monitored in the hospital. This information is not intended to replace advice given to you by your health care provider. Make sure you discuss any questions you have with your health care provider. Document Revised: 10/09/2020 Document Reviewed: 10/09/2020 Elsevier Patient Education  2023 Elsevier Inc.  

## 2021-06-03 NOTE — Progress Notes (Signed)
? ?Subjective:  ?Patient ID: [name], female    DOB: August 05, 1955  Age: 66 y.o. MRN: [id number] ? ?CC: Hypertension, Diabetes, and Osteoarthritis ? ? ?HPI ?[name] presents for f/up -  ? ?Other than arthralgias she has felt well recently and offers no other complaints. ? ?Outpatient Medications Prior to Visit  ?Medication Sig Dispense Refill  ? acetaminophen (TYLENOL) 500 MG tablet Take 500 mg by mouth every 6 (six) hours as needed.    ? albuterol (VENTOLIN HFA) 108 (90 Base) MCG/ACT inhaler INHALE 1-2 PUFFS INTO THE LUNGS DAILY AS NEEDED FOR WHEEZING OR SHORTNESS OF BREATH. 18 each 1  ? Ascorbic Acid (VITAMIN C CR) 500 MG CPCR Take by mouth.    ? Carbinoxamine Maleate 4 MG TABS TAKE 1 TABLET BY MOUTH EVERY 6 HOURS. 180 tablet 1  ? cariprazine (VRAYLAR) 3 MG capsule     ? Cinnamon 500 MG capsule Cinnamon 500 mg capsule    ? clonazePAM (KLONOPIN) 0.5 MG tablet Take 0.5 mg by mouth 2 (two) times daily as needed.    ? DULoxetine (CYMBALTA) 60 MG capsule Take 60 mg by mouth 2 (two) times daily at 10 AM and 5 PM.    ? fluticasone (FLONASE) 50 MCG/ACT nasal spray PLACE 1 SPRAY INTO BOTH NOSTRILS 2 (TWO) TIMES DAILY AS NEEDED FOR ALLERGIES OR RHINITIS. 16 mL 0  ? fluticasone (FLOVENT HFA) 220 MCG/ACT inhaler During respiratory infections or worsening symptoms Inhale 4 puffs 2 times daily for up to TWO WEEKS. 12 each 2  ? hydrocortisone 2.5 % ointment Apply topically 2 (two) times daily.    ? ketoconazole (NIZORAL) 2 % cream Apply topically daily.     ? metoprolol tartrate (LOPRESSOR) 25 MG tablet TAKE 1 TABLET BY MOUTH TWICE A DAY 180 tablet 0  ? montelukast (SINGULAIR) 10 MG tablet TAKE 1 TABLET BY MOUTH EVERYDAY AT BEDTIME 90 tablet 0  ? MYRBETRIQ 50 MG TB24 tablet Take 50 mg by mouth daily.    ? promethazine (PHENERGAN) 25 MG tablet Take 1 tablet (25 mg total) by mouth every 6 (six) hours as needed for nausea or vomiting. 45 tablet 2  ? rizatriptan (MAXALT-MLT) 10 MG disintegrating tablet TAKE 1 TABLET  EVERY DAY AS NEEDED FOR MIGRAINE, MAY REPEAT IN 2 HRS IF NEEDED. MAX 2 DOSES/WK 9 tablet 2  ? spironolactone (ALDACTONE) 25 MG tablet TAKE 1 TABLET (25 MG TOTAL) BY MOUTH DAILY. 90 tablet 0  ? zinc gluconate 50 MG tablet Take 50 mg by mouth daily.    ? zolpidem (AMBIEN) 10 MG tablet Take 10 mg by mouth at bedtime.    ? levothyroxine (SYNTHROID) 25 MCG tablet Take 1 tablet (25 mcg total) by mouth daily. 90 tablet 0  ? rosuvastatin (CRESTOR) 20 MG tablet Take 1 tablet (20 mg total) by mouth daily. 90 tablet 0  ? sodium chloride (OCEAN) 0.65 % SOLN nasal spray Place 1 spray into both nostrils as needed for congestion.    ? trihexyphenidyl (ARTANE) 2 MG tablet Take 2 mg by mouth daily.    ? Azelastine HCl 0.15 % SOLN Place 1-2 sprays into both nostrils 2 (two) times daily. 12 mL 0  ? budesonide-formoterol (SYMBICORT) 160-4.5 MCG/ACT inhaler Inhale 2 puffs into the lungs in the morning and at bedtime. 3 each 5  ? DULoxetine HCl 30 MG CSDR     ? ?No facility-administered medications prior to visit.  ? ? ?ROS ?Review of Systems  ?Constitutional:  Negative for diaphoresis  and fatigue.  ?HENT: Negative.    ?Eyes: Negative.   ?Respiratory:  Negative for cough, chest tightness, shortness of breath and wheezing.   ?Cardiovascular:  Negative for chest pain, palpitations and leg swelling.  ?Gastrointestinal:  Negative for abdominal pain, constipation, diarrhea, nausea and vomiting.  ?Endocrine: Negative.   ?Genitourinary: Negative.   ?Musculoskeletal:  Negative for myalgias.  ?Skin: Negative.   ?Neurological:  Positive for tremors. Negative for dizziness.  ?Hematological:  Negative for adenopathy. Does not bruise/bleed easily.  ?Psychiatric/Behavioral: Negative.    ? ?Objective:  ?BP 128/74 (BP Location: Right Arm, Patient Position: Sitting, Cuff Size: Large)   Pulse 87   Temp 97.9 ?F (36.6 ?C) (Oral)   Ht 5' (1.524 m)   Wt 152 lb (68.9 kg)   LMP 03/05/2012 (Approximate)   SpO2 95%   BMI 29.69 kg/m?  ? ?BP Readings from  Last 3 Encounters:  ?06/03/21 128/74  ?05/06/21 124/77  ?03/26/21 116/86  ? ? ?Wt Readings from Last 3 Encounters:  ?06/03/21 152 lb (68.9 kg)  ?05/06/21 156 lb 1.6 oz (70.8 kg)  ?03/24/21 163 lb (73.9 kg)  ? ? ?Physical Exam ?Vitals reviewed.  ?HENT:  ?   Mouth/Throat:  ?   Mouth: Mucous membranes are moist.  ?Eyes:  ?   General: No scleral icterus. ?   Conjunctiva/sclera: Conjunctivae normal.  ?Cardiovascular:  ?   Rate and Rhythm: Normal rate and regular rhythm.  ?   Heart sounds: No murmur heard. ?Pulmonary:  ?   Effort: Pulmonary effort is normal.  ?   Breath sounds: No stridor. No wheezing, rhonchi or rales.  ?Abdominal:  ?   General: Abdomen is flat.  ?   Palpations: There is no mass.  ?   Tenderness: There is no abdominal tenderness. There is no guarding.  ?   Hernia: No hernia is present.  ?Musculoskeletal:     ?   General: Deformity (djd) present. No swelling or tenderness.  ?   Cervical back: Neck supple.  ?   Right lower leg: No edema.  ?   Left lower leg: No edema.  ?Lymphadenopathy:  ?   Cervical: No cervical adenopathy.  ?Skin: ?   General: Skin is warm and dry.  ?Neurological:  ?   General: No focal deficit present.  ?   Mental Status: She is alert.  ?Psychiatric:     ?   Mood and Affect: Mood normal.     ?   Behavior: Behavior normal.  ? ? ?Lab Results  ?Component Value Date  ? WBC 8.9 03/09/2021  ? HGB 14.6 03/09/2021  ? HCT 42.4 03/09/2021  ? PLT 227.0 03/09/2021  ? GLUCOSE 124 (H) 06/03/2021  ? CHOL 258 (H) 03/09/2021  ? TRIG 116.0 03/09/2021  ? HDL 71.90 03/09/2021  ? LDLDIRECT 143.0 09/14/2016  ? LDLCALC 163 (H) 03/09/2021  ? ALT 16 03/09/2021  ? AST 22 03/09/2021  ? NA 138 06/03/2021  ? K 3.7 06/03/2021  ? CL 101 06/03/2021  ? CREATININE 0.98 06/03/2021  ? BUN 11 06/03/2021  ? CO2 28 06/03/2021  ? TSH 0.84 06/03/2021  ? INR 0.94 12/21/2017  ? HGBA1C 6.5 03/09/2021  ? MICROALBUR 0.9 03/12/2020  ? ? ?No results found. ? ?Assessment & Plan:  ? ?Breniyah was seen today for hypertension, diabetes and  osteoarthritis. ? ?Diagnoses and all orders for this visit: ? ?Essential hypertension- Her blood pressure is adequately well controlled. ?-     Basic metabolic panel; Future ?-  Magnesium; Future ?-     Magnesium ?-     Basic metabolic panel ? ?Chronic hypokalemia ?-     Basic metabolic panel; Future ?-     Magnesium; Future ?-     Magnesium ?-     Basic metabolic panel ? ?Hypothyroidism, unspecified type- Her TSH is in the normal range.  She will stay on the current T4 dosage. ?-     TSH; Future ?-     TSH ?-     levothyroxine (SYNTHROID) 25 MCG tablet; Take 1 tablet (25 mcg total) by mouth daily. ? ?Hyperlipidemia LDL goal <100 ?-     rosuvastatin (CRESTOR) 20 MG tablet; Take 1 tablet (20 mg total) by mouth daily. ? ?Bipolar affective disorder, currently manic, mild ([place]) ?-     Discontinue: trihexyphenidyl (ARTANE) 2 MG tablet; Take 1 tablet (2 mg total) by mouth daily. ? ?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HCC) ?-     trihexyphenidyl (ARTANE) 2 MG tablet; Take 1 tablet (2 mg total) by mouth daily. ? ? ?I have discontinued Katira B. Olgin's sodium chloride and DULoxetine HCl. I am also having her maintain her ketoconazole, acetaminophen, zinc gluconate, hydrocortisone, Vitamin C CR, Azelastine HCl, Cinnamon, Myrbetriq, clonazePAM, promethazine, budesonide-formoterol, Carbinoxamine Maleate, fluticasone, zolpidem, cariprazine, albuterol, metoprolol tartrate, DULoxetine, rizatriptan, fluticasone, spironolactone, montelukast, rosuvastatin, trihexyphenidyl, and levothyroxine. ? ?Meds ordered this encounter  ?Medications  ? rosuvastatin (CRESTOR) 20 MG tablet  ?  Sig: Take 1 tablet (20 mg total) by mouth daily.  ?  Dispense:  90 tablet  ?  Refill:  0  ? DISCONTD: trihexyphenidyl (ARTANE) 2 MG tablet  ?  Sig: Take 1 tablet (2 mg total) by mouth daily.  ?  Dispense:  90 tablet  ?  Refill:  1  ? trihexyphenidyl (ARTANE) 2 MG tablet  ?  Sig: Take 1 tablet (2 mg total) by mouth daily.  ?  Dispense:  90 tablet  ?  Refill:   1  ? levothyroxine (SYNTHROID) 25 MCG tablet  ?  Sig: Take 1 tablet (25 mcg total) by mouth daily.  ?  Dispense:  90 tablet  ?  Refill:  1  ? ? ? ?Follow-up: Return in about 6 months (around 12/03/2021). ? ?Blair Heys

## 2021-06-04 DIAGNOSIS — F410 Panic disorder [episodic paroxysmal anxiety] without agoraphobia: Secondary | ICD-10-CM | POA: Diagnosis not present

## 2021-06-04 DIAGNOSIS — F3132 Bipolar disorder, current episode depressed, moderate: Secondary | ICD-10-CM | POA: Diagnosis not present

## 2021-06-04 DIAGNOSIS — F411 Generalized anxiety disorder: Secondary | ICD-10-CM | POA: Diagnosis not present

## 2021-06-04 DIAGNOSIS — R69 Illness, unspecified: Secondary | ICD-10-CM | POA: Diagnosis not present

## 2021-06-09 ENCOUNTER — Encounter: Payer: Self-pay | Admitting: Allergy & Immunology

## 2021-06-09 ENCOUNTER — Ambulatory Visit: Payer: Medicare HMO | Admitting: Allergy & Immunology

## 2021-06-09 VITALS — BP 128/70 | HR 75 | Temp 98.7°F | Resp 12 | Ht <= 58 in | Wt 153.2 lb

## 2021-06-09 DIAGNOSIS — J3089 Other allergic rhinitis: Secondary | ICD-10-CM

## 2021-06-09 DIAGNOSIS — J302 Other seasonal allergic rhinitis: Secondary | ICD-10-CM

## 2021-06-09 DIAGNOSIS — J4540 Moderate persistent asthma, uncomplicated: Secondary | ICD-10-CM

## 2021-06-09 DIAGNOSIS — J383 Other diseases of vocal cords: Secondary | ICD-10-CM | POA: Diagnosis not present

## 2021-06-09 DIAGNOSIS — J45998 Other asthma: Secondary | ICD-10-CM | POA: Diagnosis not present

## 2021-06-09 MED ORDER — AMOXICILLIN-POT CLAVULANATE 875-125 MG PO TABS: 1.0000 | ORAL_TABLET | Freq: Two times a day (BID) | ORAL | 0 refills | Status: AC

## 2021-06-09 NOTE — Patient Instructions (Addendum)
1. Moderate persistent asthma, uncomplicated ?- Spirometry looked good today. ?- We will not make any changes today.  ?- New spacer provided today.  ?- Daily controller medication(s): Singulair '10mg'$  daily and Symbicort 166mg 2 puffs twice daily with spacer ?- Prior to physical activity: albuterol 2 puffs 10-15 minutes before physical activity. ?- Rescue medications: albuterol 4 puffs every 4-6 hours as needed ?- Changes during respiratory infections or worsening symptoms: Add on Flovent 2236m to 4 puffs  2 times daily for TWO WEEKS. ?- Asthma control goals:  ?* Full participation in all desired activities (may need albuterol before activity) ?* Albuterol use two time or less a week on average (not counting use with activity) ?* Cough interfering with sleep two time or less a month ?* Oral steroids no more than once a year ?* No hospitalizations ? ?2. Seasonal and perennial allergic rhinitis ?- Continue with Singulair '10mg'$  once daily. ?- Continue with carbinoxamine up to three times daily. ?- Continue with azelastine one spray per nostril up to TWICE as needed depending on your symptoms.  ?- Continue with Pataday eye drops twice daily as needed. ?- Start prednisone pack provided to get you through this worsening sinus flare you are experiencing.  ?- We are also sending in Augmentin to use twice daily for ten days.  ? ?3. Return in about 6 months (around 12/10/2021).  ? ? ?Please inform usKoreaf any Emergency Department visits, hospitalizations, or changes in symptoms. Call usKoreaefore going to the ED for breathing or allergy symptoms since we might be able to fit you in for a sick visit. Feel free to contact usKoreanytime with any questions, problems, or concerns. ? ?It was a pleasure to see you again today! I hope you heal fully!  ? ?Websites that have reliable patient information: ?1. American Academy of Asthma, Allergy, and Immunology: www.aaaai.org ?2. Food Allergy Research and Education (FARE): foodallergy.org ?3.  Mothers of Asthmatics: http://www.asthmacommunitynetwork.org ?4. [employer] Allergy, Asthma, and Immunology: wwMonthlyElectricBill.co.uk ? ?COVID-19 Vaccine Information can be found at: [email or web address] questions related to vaccine distribution or appointments, please email [email or web address] or call [phone] ? ?We realize that you might be concerned about having an allergic reaction to the COVID19 vaccines. To help with that concern, WE ARE OFFERING THE COVID19 VACCINES IN OUR OFFICE! Ask the front desk for dates!  ? ? ? ??Like? usKorean Facebook and Instagram for our latest updates!  ?  ? ? ?A healthy democracy works best when ALNew York Life Insurancearticipate! Make sure you are registered to vote! If you have moved or changed any of your contact information, you will need to get this updated before voting! ? ?In some cases, you MAY be able to register to vote online: [email or web address] ? ? ? ?
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
## 2021-06-11 ENCOUNTER — Encounter: Payer: Self-pay | Admitting: Allergy & Immunology

## 2021-06-11 ENCOUNTER — Other Ambulatory Visit: Payer: Self-pay | Admitting: Allergy & Immunology

## 2021-06-11 NOTE — Addendum Note (Signed)
Addended by: Eloy End D on: 06/11/2021 09:16 AM ? ? Modules accepted: Orders ? ?

## 2021-06-12 ENCOUNTER — Telehealth: Payer: Self-pay | Admitting: Allergy & Immunology

## 2021-06-12 NOTE — Telephone Encounter (Signed)
Patient called the office today with an update on how she's feeling. She states the Prednisone helped a lot and all her symptoms are almost gone. She decided to hold off on the Augmentin so she never started it.  ?

## 2021-06-12 NOTE — Telephone Encounter (Signed)
Noted - thank you! Glad she is feeling better!  ? ?Salvatore Marvel, MD ?[facility] [place] [facility] ? ?

## 2021-06-17 DIAGNOSIS — R69 Illness, unspecified: Secondary | ICD-10-CM | POA: Diagnosis not present

## 2021-06-17 DIAGNOSIS — F411 Generalized anxiety disorder: Secondary | ICD-10-CM | POA: Diagnosis not present

## 2021-06-18 ENCOUNTER — Telehealth: Payer: Self-pay | Admitting: Internal Medicine

## 2021-06-18 NOTE — Telephone Encounter (Signed)
PT calls today in need of someone to discuss recent lab results with them. PT acknowledged that she does not use MyChart. PT also wanted to know why her diagnosis of being diabetic was on her form as opposed to pre-diabetic. If anyone can call her back to discuss the results ? ?CB: [phone] ?

## 2021-06-19 NOTE — Telephone Encounter (Signed)
Left voicemail for patient to return office call in regards to her recent call. ?

## 2021-06-22 DIAGNOSIS — G4733 Obstructive sleep apnea (adult) (pediatric): Secondary | ICD-10-CM | POA: Diagnosis not present

## 2021-06-24 NOTE — Telephone Encounter (Signed)
Pt called in and requesting call back in regards to this message.  ? ?Please call as soon as possible, as pt states she is "very disturbed that she has not heard anything."  ?

## 2021-06-28 ENCOUNTER — Other Ambulatory Visit: Payer: Self-pay | Admitting: Internal Medicine

## 2021-06-28 DIAGNOSIS — I10 Essential (primary) hypertension: Secondary | ICD-10-CM

## 2021-06-28 DIAGNOSIS — I493 Ventricular premature depolarization: Secondary | ICD-10-CM

## 2021-06-29 DIAGNOSIS — R351 Nocturia: Secondary | ICD-10-CM | POA: Diagnosis not present

## 2021-06-29 DIAGNOSIS — N3941 Urge incontinence: Secondary | ICD-10-CM | POA: Diagnosis not present

## 2021-06-29 DIAGNOSIS — N3010 Interstitial cystitis (chronic) without hematuria: Secondary | ICD-10-CM | POA: Diagnosis not present

## 2021-07-09 ENCOUNTER — Other Ambulatory Visit: Payer: Self-pay | Admitting: Allergy & Immunology

## 2021-07-17 DIAGNOSIS — R69 Illness, unspecified: Secondary | ICD-10-CM | POA: Diagnosis not present

## 2021-07-17 DIAGNOSIS — F423 Hoarding disorder: Secondary | ICD-10-CM | POA: Diagnosis not present

## 2021-08-19 ENCOUNTER — Ambulatory Visit
Admission: RE | Admit: 2021-08-19 | Discharge: 2021-08-19 | Disposition: A | Discharge status: Home / Self Care | Payer: Medicare HMO | Source: Ambulatory Visit | Attending: Internal Medicine | Admitting: Internal Medicine

## 2021-08-19 DIAGNOSIS — M85831 Other specified disorders of bone density and structure, right forearm: Secondary | ICD-10-CM | POA: Diagnosis not present

## 2021-08-19 DIAGNOSIS — E2839 Other primary ovarian failure: Secondary | ICD-10-CM

## 2021-08-19 DIAGNOSIS — M810 Age-related osteoporosis without current pathological fracture: Secondary | ICD-10-CM | POA: Diagnosis not present

## 2021-08-19 DIAGNOSIS — Z780 Asymptomatic menopausal state: Secondary | ICD-10-CM | POA: Diagnosis not present

## 2021-08-20 ENCOUNTER — Telehealth: Payer: Self-pay

## 2021-08-20 NOTE — Telephone Encounter (Signed)
Evenity VOB initiated via [email or web address]  New start    Janith Lima, MD  Jasper Loser, CMA Will you see if she can get evenity?

## 2021-08-23 NOTE — Telephone Encounter (Signed)
Prior auth required for EVENITY  PA PROCESS DETAILS: PA is required. Please call [phone], or fax [phone] 

## 2021-08-24 ENCOUNTER — Encounter: Payer: Self-pay | Admitting: Internal Medicine

## 2021-08-24 ENCOUNTER — Ambulatory Visit (INDEPENDENT_AMBULATORY_CARE_PROVIDER_SITE_OTHER): Payer: Medicare HMO | Admitting: Internal Medicine

## 2021-08-24 VITALS — BP 128/78 | HR 87 | Temp 98.1°F | Ht <= 58 in | Wt 145.0 lb

## 2021-08-24 DIAGNOSIS — I10 Essential (primary) hypertension: Secondary | ICD-10-CM | POA: Diagnosis not present

## 2021-08-24 DIAGNOSIS — M19011 Primary osteoarthritis, right shoulder: Secondary | ICD-10-CM

## 2021-08-24 DIAGNOSIS — G43809 Other migraine, not intractable, without status migrainosus: Secondary | ICD-10-CM | POA: Diagnosis not present

## 2021-08-24 DIAGNOSIS — E785 Hyperlipidemia, unspecified: Secondary | ICD-10-CM

## 2021-08-24 DIAGNOSIS — E119 Type 2 diabetes mellitus without complications: Secondary | ICD-10-CM

## 2021-08-24 LAB — BASIC METABOLIC PANEL
BUN: 14 mg/dL (ref 6–23)
CO2: 29 mEq/L (ref 19–32)
Calcium: 9.5 mg/dL (ref 8.4–10.5)
Chloride: 101 mEq/L (ref 96–112)
Creatinine, Ser: 0.96 mg/dL (ref 0.40–1.20)
GFR: 62.4 mL/min (ref >60.00)
Glucose, Bld: 122 mg/dL — ABNORMAL HIGH (ref 70–99)
Potassium: 3.9 mEq/L (ref 3.5–5.1)
Sodium: 139 mEq/L (ref 135–145)

## 2021-08-24 LAB — URINALYSIS, ROUTINE W REFLEX MICROSCOPIC
Bilirubin Urine: NEGATIVE
Hgb urine dipstick: NEGATIVE
Ketones, ur: NEGATIVE
Nitrite: NEGATIVE
Specific Gravity, Urine: 1.02 (ref 1.000–1.030)
Total Protein, Urine: NEGATIVE
Urine Glucose: NEGATIVE
Urobilinogen, UA: 0.2 (ref 0.0–1.0)
pH: 6 (ref 5.0–8.0)

## 2021-08-24 LAB — CBC WITH DIFFERENTIAL/PLATELET
Basophils Absolute: 0 10*3/uL (ref 0.0–0.1)
Basophils Relative: 0.5 % (ref 0.0–3.0)
Eosinophils Absolute: 0.2 10*3/uL (ref 0.0–0.7)
Eosinophils Relative: 2.4 % (ref 0.0–5.0)
HCT: 41.6 % (ref 36.0–46.0)
Hemoglobin: 14 g/dL (ref 12.0–15.0)
Lymphocytes Relative: 28.5 % (ref 12.0–46.0)
Lymphs Abs: 2 10*3/uL (ref 0.7–4.0)
MCHC: 33.7 g/dL (ref 30.0–36.0)
MCV: 89.4 fl (ref 78.0–100.0)
Monocytes Absolute: 0.5 10*3/uL (ref 0.1–1.0)
Monocytes Relative: 7.7 % (ref 3.0–12.0)
Neutro Abs: 4.2 10*3/uL (ref 1.4–7.7)
Neutrophils Relative %: 60.9 % (ref 43.0–77.0)
Platelets: 207 10*3/uL (ref 150.0–400.0)
RBC: 4.65 Mil/uL (ref 3.87–5.11)
RDW: 12.5 % (ref 11.5–15.5)
WBC: 6.9 10*3/uL (ref 4.0–10.5)

## 2021-08-24 LAB — MICROALBUMIN / CREATININE URINE RATIO
Creatinine,U: 119.2 mg/dL
Microalb Creat Ratio: 1.1 mg/g (ref 0.0–30.0)
Microalb, Ur: 1.3 mg/dL (ref 0.0–1.9)

## 2021-08-24 LAB — HEMOGLOBIN A1C: Hgb A1c MFr Bld: 5.6 % (ref 4.6–6.5)

## 2021-08-24 MED ORDER — ROSUVASTATIN CALCIUM 20 MG PO TABS: 20.0000 mg | ORAL_TABLET | Freq: Every day | ORAL | 1 refills | Status: DC

## 2021-08-24 MED ORDER — RIZATRIPTAN BENZOATE 10 MG PO TBDP: ORAL_TABLET | ORAL | 2 refills | Status: DC

## 2021-08-24 NOTE — Progress Notes (Signed)
Subjective:  Patient ID: [name], female    DOB: [birth year]  Age: 66 y.o. MRN: [id number]  CC: Diabetes, Hyperlipidemia, Hypertension, and Osteoarthritis   HPI [name] presents for f/up -  She complains of a 1 month history of worsening right shoulder pain with no injury or trauma.  She has a history of OA and would like to see an orthopedist.  Outpatient Medications Prior to Visit  Medication Sig Dispense Refill   albuterol (VENTOLIN HFA) 108 (90 Base) MCG/ACT inhaler INHALE 1-2 PUFFS INTO THE LUNGS DAILY AS NEEDED FOR WHEEZING OR SHORTNESS OF BREATH. 18 each 1   Ascorbic Acid (VITAMIN C CR) 500 MG CPCR Take by mouth.     Carbinoxamine Maleate 4 MG TABS TAKE 1 TABLET BY MOUTH EVERY 6 HOURS. 180 tablet 1   cariprazine (VRAYLAR) 3 MG capsule      Cinnamon 500 MG capsule Cinnamon 500 mg capsule     clonazePAM (KLONOPIN) 0.5 MG tablet Take 0.5 mg by mouth 2 (two) times daily as needed.     DULoxetine (CYMBALTA) 60 MG capsule Take 60 mg by mouth 2 (two) times daily at 10 AM and 5 PM.     fluticasone (FLONASE) 50 MCG/ACT nasal spray PLACE 1 SPRAY INTO BOTH NOSTRILS 2 (TWO) TIMES DAILY AS NEEDED FOR ALLERGIES OR RHINITIS. 16 mL 5   fluticasone (FLOVENT HFA) 220 MCG/ACT inhaler DURING RESPIRATORY INFECTIONS OR WORSENING SYMPTOMS INHALE 4 PUFFS 2 TIMES DAILY FOR UP TO TWO WEEKS 12 g 5   hydrocortisone 2.5 % ointment Apply topically 2 (two) times daily.     ketoconazole (NIZORAL) 2 % cream Apply topically daily.      levothyroxine (SYNTHROID) 25 MCG tablet Take 1 tablet (25 mcg total) by mouth daily. 90 tablet 1   metoprolol tartrate (LOPRESSOR) 25 MG tablet TAKE 1 TABLET BY MOUTH TWICE A DAY 180 tablet 0   MYRBETRIQ 50 MG TB24 tablet Take 50 mg by mouth daily.     spironolactone (ALDACTONE) 25 MG tablet TAKE 1 TABLET (25 MG TOTAL) BY MOUTH DAILY. 90 tablet 0   trihexyphenidyl (ARTANE) 2 MG tablet Take 1 tablet (2 mg total) by mouth daily. 90 tablet 1   zinc gluconate 50 MG  tablet Take 50 mg by mouth daily.     zolpidem (AMBIEN) 10 MG tablet Take 10 mg by mouth at bedtime.     montelukast (SINGULAIR) 10 MG tablet TAKE 1 TABLET BY MOUTH EVERYDAY AT BEDTIME 90 tablet 0   promethazine (PHENERGAN) 25 MG tablet Take 1 tablet (25 mg total) by mouth every 6 (six) hours as needed for nausea or vomiting. 45 tablet 2   rizatriptan (MAXALT-MLT) 10 MG disintegrating tablet TAKE 1 TABLET EVERY DAY AS NEEDED FOR MIGRAINE, MAY REPEAT IN 2 HRS IF NEEDED. MAX 2 DOSES/WK 9 tablet 2   rosuvastatin (CRESTOR) 20 MG tablet Take 1 tablet (20 mg total) by mouth daily. 90 tablet 0   Azelastine HCl 0.15 % SOLN Place 1-2 sprays into both nostrils 2 (two) times daily. 12 mL 0   budesonide-formoterol (SYMBICORT) 160-4.5 MCG/ACT inhaler Inhale 2 puffs into the lungs in the morning and at bedtime. 3 each 5   No facility-administered medications prior to visit.    ROS Review of Systems  Constitutional: Negative.  Negative for diaphoresis and fatigue.  HENT: Negative.    Eyes: Negative.   Respiratory:  Negative for cough, chest tightness, shortness of breath and wheezing.   Cardiovascular:  Negative  for chest pain, palpitations and leg swelling.  Gastrointestinal:  Negative for abdominal pain, constipation, diarrhea, nausea and vomiting.  Endocrine: Negative.   Genitourinary: Negative.  Negative for difficulty urinating.  Musculoskeletal:  Positive for arthralgias. Negative for back pain and myalgias.  Neurological:  Negative for dizziness, weakness and headaches.  Hematological:  Negative for adenopathy. Does not bruise/bleed easily.  Psychiatric/Behavioral: Negative.      Objective:  BP 128/78 (BP Location: Left Arm, Patient Position: Sitting, Cuff Size: Large)   Pulse 87   Temp 98.1 F (36.7 C) (Oral)   Ht '4\' 9"'$  (1.448 m)   Wt 145 lb (65.8 kg)   LMP 03/05/2012 (Approximate)   SpO2 93%   BMI 31.38 kg/m   BP Readings from Last 3 Encounters:  08/24/21 128/78  06/09/21 128/70   06/03/21 128/74    Wt Readings from Last 3 Encounters:  08/24/21 145 lb (65.8 kg)  06/09/21 153 lb 3.2 oz (69.5 kg)  06/03/21 152 lb (68.9 kg)    Physical Exam Vitals reviewed.  HENT:     Mouth/Throat:     Mouth: Mucous membranes are moist.  Eyes:     General: No scleral icterus.    Conjunctiva/sclera: Conjunctivae normal.  Cardiovascular:     Rate and Rhythm: Normal rate and regular rhythm.     Pulses: Normal pulses.     Heart sounds: No murmur heard. Pulmonary:     Effort: Pulmonary effort is normal.     Breath sounds: No stridor. No wheezing, rhonchi or rales.  Abdominal:     General: Abdomen is flat.     Palpations: There is no mass.     Tenderness: There is no abdominal tenderness. There is no guarding.     Hernia: No hernia is present.  Musculoskeletal:        General: Normal range of motion.     Cervical back: Neck supple.     Right lower leg: No edema.     Left lower leg: No edema.  Lymphadenopathy:     Cervical: No cervical adenopathy.  Skin:    General: Skin is warm and dry.  Neurological:     General: No focal deficit present.     Mental Status: She is alert.  Psychiatric:        Mood and Affect: Mood normal.        Behavior: Behavior normal.     Lab Results  Component Value Date   WBC 6.9 08/24/2021   HGB 14.0 08/24/2021   HCT 41.6 08/24/2021   PLT 207.0 08/24/2021   GLUCOSE 122 (H) 08/24/2021   CHOL 258 (H) 03/09/2021   TRIG 116.0 03/09/2021   HDL 71.90 03/09/2021   LDLDIRECT 143.0 09/14/2016   LDLCALC 163 (H) 03/09/2021   ALT 16 03/09/2021   AST 22 03/09/2021   NA 139 08/24/2021   K 3.9 08/24/2021   CL 101 08/24/2021   CREATININE 0.96 08/24/2021   BUN 14 08/24/2021   CO2 29 08/24/2021   TSH 0.84 06/03/2021   INR 0.94 12/21/2017   HGBA1C 5.6 08/24/2021   MICROALBUR 1.3 08/24/2021    DG Bone Density  Result Date: 08/19/2021 EXAM: DUAL X-RAY ABSORPTIOMETRY (DXA) FOR BONE MINERAL DENSITY IMPRESSION: Referring Physician:  Janith Lima Your patient completed a bone mineral density test using GE Lunar iDXA system (analysis version: 16). Technologist: [place] PATIENT: Name: [name] Patient ID: [id number] Birth Date: [birth year] Height: 58.0 in. Sex: Female Measured: 08/19/2021 Weight: 145.0 lbs. Indications:  Estrogen Deficient, Postmenopausal Fractures: Nose, Right Ankle Treatments: None ASSESSMENT: The BMD measured at Femur Neck Left is 0.683 g/cm2 with a T-score of -2.6. This patient is considered osteoporotic according to [facility]) criteria. The quality of the exam is good. L3 was excluded due to degenerative changes. Site Region Measured Date Measured Age YA BMD Significant CHANGE T-score DualFemur Neck Left 08/19/2021 64.7 -2.6 0.683 g/cm2 AP Spine L1-L4 (L3) 08/19/2021 64.7 -0.7 1.080 g/cm2 DualFemur Total Mean 08/19/2021 64.7 -2.3 0.716 g/cm2 Right Forearm Radius 33% 08/19/2021 64.7 -1.5 0.746 g/cm2 World Health Organization [facility]) criteria for post-menopausal, Caucasian Women: Normal       T-score at or above -1 SD Osteopenia   T-score between -1 and -2.5 SD Osteoporosis T-score at or below -2.5 SD RECOMMENDATION: 1. All patients should optimize calcium and vitamin D intake. 2. Consider FDA-approved medical therapies in postmenopausal women and men aged 67 years and older, based on the following: a. A hip or vertebral (clinical or morphometric) fracture. b. T-score = -2.5 at the femoral neck or spine after appropriate evaluation to exclude secondary causes. c. Low bone mass (T-score between -1.0 and -2.5 at the femoral neck or spine) and a 10-year probability of a hip fracture = 3% or a 10-year probability of a major osteoporosis-related fracture = 20% based on the [place]-adapted WHO algorithm. d. Clinician judgment and/or patient preferences may indicate treatment for people with 10-year fracture probabilities above or below these levels. FOLLOW-UP: Patients with diagnosis of osteoporosis or at high risk for  fracture should have regular bone mineral density tests.? Patients eligible for Medicare are allowed routine testing every 2 years.? The testing frequency can be increased to one year for patients who have rapidly progressing disease, are receiving or discontinuing medical therapy to restore bone mass, or have additional risk factors. I have reviewed this study and agree with the findings. [facility] Electronically Signed   By: Fidela Salisbury M.D.   On: 08/19/2021 12:24    Assessment & Plan:   Tatiyana was seen today for diabetes, hyperlipidemia, hypertension and osteoarthritis.  Diagnoses and all orders for this visit:  Type 2 diabetes mellitus without complication, without long-term current use of insulin ([place])- Her blood sugar is very well controlled. -     Basic metabolic panel; Future -     Microalbumin / creatinine urine ratio; Future -     Hemoglobin A1c; Future -     Ambulatory referral to Ophthalmology -     Hemoglobin A1c -     Microalbumin / creatinine urine ratio -     Basic metabolic panel  Other migraine without status migrainosus, not intractable -     rizatriptan (MAXALT-MLT) 10 MG disintegrating tablet; TAKE 1 TABLET EVERY DAY AS NEEDED FOR MIGRAINE, MAY REPEAT IN 2 HRS IF NEEDED. MAX 2 DOSES/WK  Essential hypertension- Her BP is well controlled. -     Basic metabolic panel; Future -     CBC with Differential/Platelet; Future -     Urinalysis, Routine w reflex microscopic; Future -     Urinalysis, Routine w reflex microscopic -     CBC with Differential/Platelet -     Basic metabolic panel  Osteoarthritis of right glenohumeral joint -     Ambulatory referral to Orthopedic Surgery  Hyperlipidemia LDL goal <100 -     rosuvastatin (CRESTOR) 20 MG tablet; Take 1 tablet (20 mg total) by mouth daily.   I have discontinued Luverne B. Cumberledge's promethazine.  I am also having her maintain her ketoconazole, zinc gluconate, hydrocortisone, Vitamin C CR,  Azelastine HCl, Cinnamon, Myrbetriq, clonazePAM, budesonide-formoterol, Carbinoxamine Maleate, zolpidem, cariprazine, albuterol, DULoxetine, spironolactone, trihexyphenidyl, levothyroxine, fluticasone, metoprolol tartrate, fluticasone, rizatriptan, and rosuvastatin.  Meds ordered this encounter  Medications   rizatriptan (MAXALT-MLT) 10 MG disintegrating tablet    Sig: TAKE 1 TABLET EVERY DAY AS NEEDED FOR MIGRAINE, MAY REPEAT IN 2 HRS IF NEEDED. MAX 2 DOSES/WK    Dispense:  9 tablet    Refill:  2   rosuvastatin (CRESTOR) 20 MG tablet    Sig: Take 1 tablet (20 mg total) by mouth daily.    Dispense:  90 tablet    Refill:  1     Follow-up: Return in about 6 months (around 02/24/2022).  [name], MD

## 2021-08-24 NOTE — Patient Instructions (Signed)

## 2021-08-26 ENCOUNTER — Ambulatory Visit: Payer: Medicare HMO | Admitting: Orthopaedic Surgery

## 2021-08-26 ENCOUNTER — Ambulatory Visit (INDEPENDENT_AMBULATORY_CARE_PROVIDER_SITE_OTHER): Payer: Medicare HMO

## 2021-08-26 ENCOUNTER — Encounter: Payer: Self-pay | Admitting: Orthopaedic Surgery

## 2021-08-26 DIAGNOSIS — M25511 Pain in right shoulder: Secondary | ICD-10-CM

## 2021-08-26 MED ORDER — METHYLPREDNISOLONE ACETATE 40 MG/ML IJ SUSP
40.0000 mg | INTRAMUSCULAR | Status: AC | PRN
  Administered 2021-08-26: 40 mg via INTRA_ARTICULAR

## 2021-08-26 NOTE — Progress Notes (Signed)
Office Visit Note   Patient: [name]           Date of Birth: 1955/02/12           MRN: [id number] Visit Date: 08/26/2021              Requested by: Janith Lima, MD [address] [place],  [place] [address] PCP: Janith Lima, MD   Assessment & Plan: Visit Diagnoses:  1. Acute pain of right shoulder     Plan: I spoke to her in length in detail about trying Voltaren gel to the area where she is having the most tenderness 2-3 times a day.  She can get this over-the-counter.  I also recommended a steroid injection in her right shoulder subacromial outlet which she agreed to.  She is allergic to lidocaine and Marcaine so we recommended the injection with Depo-Medrol diluted with some saline.  She did tolerate that injection well.  I gave her reassurance that I do not see thing worrisome on her plain films including the lesions in the proximal humerus.  We will see her back in 4 weeks to see how she is doing overall.  All question concerns were answered and addressed.  Follow-Up Instructions: Return in about 4 weeks (around 09/23/2021).   Orders:  Orders Placed This Encounter  Procedures   Large Joint Inj   XR Shoulder Right   No orders of the defined types were placed in this encounter.     Procedures: Large Joint Inj: R subacromial bursa on 08/26/2021 10:37 AM Indications: pain and diagnostic evaluation Details: 22 G 1.5 in needle  Arthrogram: No  Medications: 40 mg methylPREDNISolone acetate 40 MG/ML Outcome: tolerated well, no immediate complications Procedure, treatment alternatives, risks and benefits explained, specific risks discussed. Consent was given by the patient. Immediately prior to procedure a time out was called to verify the correct patient, procedure, equipment, support staff and site/side marked as required. Patient was prepped and draped in the usual sterile fashion.       Clinical Data: No additional findings.   Subjective: Chief  Complaint  Patient presents with   Right Shoulder - Pain  The patient is a very pleasant 66 year old female who comes in with right upper arm pain that may be radiating from her shoulder.  This been going on for about a month with no known injury.  It hurts mainly at the mid humerus area.  There is actually a bruise on her arm from where someone was assessing her arm and palpating hard which I still feel is appropriate.  She is not on blood thinning medications and she is a diabetic but her hemoglobin A1c just a few days ago was 5.6.  She is not on medications since she is really prediabetic.  She is never had surgery on the right shoulder.  She had a fall back in February and a CT scan was obtained of the cervical spine showing significant degenerative changes at several levels in terms of degenerative disc disease.  She denies any radicular symptoms.  HPI  Review of Systems There is currently listed no fever, chills, nausea, vomiting  Objective: Vital Signs: LMP 03/05/2012 (Approximate)   Physical Exam She is alert and orient x3 and in no acute distress Ortho Exam Examination of the right shoulder shows a move smoothly and fluidly but there is some slight pain in the shoulder itself that radiates into the subdeltoid area and I think that is where she  is experiencing her pain from just some slight impingement. Specialty Comments:  No specialty comments available.  Imaging: XR Shoulder Right  Result Date: 08/26/2021 3 views of the right shoulder show no acute findings.    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   Osteoarthritis of glenohumeral joint 03/26/2019   Iron deficiency anemia 04/28/2018   OA (osteoarthritis) of knee 12/26/2017   PVC (premature ventricular contraction) 11/21/2017   Dysphonia 09/26/2017   Vitamin D deficiency, unspecified 10/13/2016   Primary osteoarthritis of left hand 06/30/2016   Osteoporosis without current pathological fracture 03/22/2016   Paraesophageal hiatal hernia s/p robotic repair & fundoplication 1/93/7902 40/97/3532   Bipolar 1 disorder, mixed, moderate ([place]) 01/29/2016   Class 1 obesity with body mass index (BMI) of 32.0 to 32.9 in adult 01/06/2016   Migraines 11/21/2014   OSA on CPAP 01/30/2007   GERD 11/02/2006   DERMATITIS, HANDS 11/02/2006   Hypothyroidism 10/07/2006   Bipolar affective ([place]) 10/07/2006   Essential hypertension 10/07/2006   RHINITIS, CHRONIC 10/07/2006   Seasonal and perennial allergic rhinitis 10/07/2006   Moderate persistent asthma, uncomplicated 99/24/2683   Irritable bowel syndrome 10/07/2006   INTERSTITIAL CYSTITIS 10/07/2006   ROSACEA 10/07/2006   Osteoarthritis of both knees 10/07/2006   Disturbance in sleep behavior 10/07/2006   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   Hypothyroidism    Intermittent palpitations    cardiology--  dr [place]   Interstitial cystitis    Limited jaw range of motion    s/p  bilateral  TMJ surgery,  age 38s   Migraines    Moderate persistent asthma    pulmologist-- dr Halford Chessman   OA (osteoarthritis)    both knees   OSA on CPAP    per last sleep study 09/ 2017  mild OSA, AHI13/hr   PONV (postoperative nausea and vomiting)    PVC (premature ventricular contraction)    Rosacea    Sensation of pressure in bladder area    per pt intermittant   TMJ arthralgia    Urticaria    Wears glasses    White coat syndrome without hypertension    05-25-2017  per pt hx hypertension yrs ago, no issues after quitting stressful job    Family History  Problem Relation Age of Onset   Hypertension Mother        deceased from MVA complications   Thyroid disease Mother  Allergic rhinitis Mother    Testicular cancer Father    Allergic rhinitis Father    Colon cancer Sister    Colon polyps Sister    Healthy Sister    Healthy Brother    Coronary artery disease Other    Stomach cancer Neg Hx     Past Surgical History:  Procedure Laterality Date   33 HOUR [place] STUDY N/A 09/22/2015   Procedure: 24 HOUR PH STUDY;  Surgeon: Doran Stabler, MD;  Location: [facility];  Service: Gastroenterology;  Laterality: N/A;   ADENOIDECTOMY     ANAL FISSURE REPAIR  age 81  (17)   BREAST EXCISIONAL BIOPSY Right 04-16-2003  dr p. young  [facility]   benign   BUNIONECTOMY Right early 2000s   CARPAL TUNNEL RELEASE Left age 45 (74)   CHOLECYSTECTOMY OPEN  age 70   CYSTO WITH HYDRODISTENSION N/A 06/02/2017   Procedure: CYSTOSCOPY/HYDRODISTENSION OF BLADDER;  Surgeon: Irine Seal, MD;  Location: [facility];  Service: Urology;  Laterality: N/A;   CYSTO/ HYDRODISTENTION/  INSTILLATION THERAPY  1990s   DX LAPAROSCOPY/  DX HYSTEROSCOPY/  D & C  08-07-2007   dr dove  [facility]  10-21-2008   dr Harolyn Rutherford  [facility]   ESOPHAGEAL MANOMETRY N/A 09/22/2015   Procedure: ESOPHAGEAL MANOMETRY (EM);  Surgeon: Doran Stabler, MD;  Location: [facility];  Service: Gastroenterology;   Laterality: N/A;  with impedence    FINGER ARTHROPLASTY Bilateral    left little finger 2016:  right middle finger 06/ 2018   KNEE ARTHROSCOPY Bilateral x3 left ;  x3  right-- last one age 83 (20)   NASAL SEPTUM SURGERY  age 80s   ROBOT ASSISTED REDUCTION PARAESOPHAGEAL HIATAL HERNIA/ TYPE 2 MEDIASTINAL DISSECTION/ PRIMARY HIATAL HERNIA REPAIR/ ANTERIOR & POSTERIOR GASTROPEXY/ NISSEN FUNDOPLICATION  [phone]   DR GROSS  [facility]   SINOSCOPY     TEMPOROMANDIBULAR JOINT SURGERY Bilateral x3  -- age 73s   per pt used graft   TONSILLECTOMY     TOTAL KNEE ARTHROPLASTY Left 12/26/2017   Procedure: LEFT TOTAL KNEE ARTHROPLASTY;  Surgeon: Gaynelle Arabian, MD;  Location: [facility];  Service: Orthopedics;  Laterality: Left;  65mn   TRANSTHORACIC ECHOCARDIOGRAM  12-06-2017   dr jMartinique  ef 55-60%, grade 1 diastoilc dysfunction, trivial MR   TRIGGER FINGER RELEASE Bilateral last one 2017   several release's bilaterally   ULNAR NERVE TRANSPOSITION Bilateral age 66((51   Social History   Occupational History   Occupation: disability  Tobacco Use   Smoking status: Never   Smokeless tobacco: Never  Vaping Use   Vaping Use: Never used  Substance and Sexual Activity   Alcohol use: Yes    Alcohol/week: 0.0 standard drinks of alcohol    Comment: once every 3-4 months   Drug use: No   Sexual activity: Not Currently    Partners: Male    Birth control/protection: Post-menopausal

## 2021-08-27 NOTE — Telephone Encounter (Signed)
Prior Authorization initiated for [facility] via [employer] Case ID: [id number]

## 2021-08-28 ENCOUNTER — Other Ambulatory Visit: Payer: Self-pay | Admitting: Allergy & Immunology

## 2021-09-02 DIAGNOSIS — F410 Panic disorder [episodic paroxysmal anxiety] without agoraphobia: Secondary | ICD-10-CM | POA: Diagnosis not present

## 2021-09-02 DIAGNOSIS — R69 Illness, unspecified: Secondary | ICD-10-CM | POA: Diagnosis not present

## 2021-09-02 DIAGNOSIS — F3132 Bipolar disorder, current episode depressed, moderate: Secondary | ICD-10-CM | POA: Diagnosis not present

## 2021-09-02 DIAGNOSIS — F411 Generalized anxiety disorder: Secondary | ICD-10-CM | POA: Diagnosis not present

## 2021-09-02 NOTE — Telephone Encounter (Addendum)
Coverage for EVENITY DENIED The office should receive a fax from [place] explaining reason for denial.

## 2021-09-07 DIAGNOSIS — F3132 Bipolar disorder, current episode depressed, moderate: Secondary | ICD-10-CM | POA: Diagnosis not present

## 2021-09-07 DIAGNOSIS — R69 Illness, unspecified: Secondary | ICD-10-CM | POA: Diagnosis not present

## 2021-09-09 ENCOUNTER — Ambulatory Visit: Payer: Medicare HMO | Admitting: Internal Medicine

## 2021-09-28 ENCOUNTER — Ambulatory Visit: Payer: Medicare HMO | Admitting: Orthopaedic Surgery

## 2021-09-29 ENCOUNTER — Institutional Professional Consult (permissible substitution): Payer: Medicare HMO | Admitting: Pulmonary Disease

## 2021-10-05 ENCOUNTER — Telehealth: Payer: Self-pay | Admitting: Internal Medicine

## 2021-10-05 NOTE — Telephone Encounter (Signed)
Called pt, LVM informing her that PCP is out of the office this week and that her message has been forwarded to him for review upon his return.

## 2021-10-05 NOTE — Telephone Encounter (Signed)
Patient called in stating that her insurance has denied the osteoporosis medication that Dr. Ronnald Ramp prescribed. She would like to know what the plan is now that medication has been denied. Call back number is [phone]

## 2021-10-07 DIAGNOSIS — F410 Panic disorder [episodic paroxysmal anxiety] without agoraphobia: Secondary | ICD-10-CM | POA: Diagnosis not present

## 2021-10-07 DIAGNOSIS — F3132 Bipolar disorder, current episode depressed, moderate: Secondary | ICD-10-CM | POA: Diagnosis not present

## 2021-10-07 DIAGNOSIS — R69 Illness, unspecified: Secondary | ICD-10-CM | POA: Diagnosis not present

## 2021-10-14 NOTE — Telephone Encounter (Signed)
Prolia VOB initiated via [email or web address]  Last OV:  New start

## 2021-10-15 DIAGNOSIS — H2513 Age-related nuclear cataract, bilateral: Secondary | ICD-10-CM | POA: Diagnosis not present

## 2021-10-15 DIAGNOSIS — H524 Presbyopia: Secondary | ICD-10-CM | POA: Diagnosis not present

## 2021-10-15 DIAGNOSIS — E119 Type 2 diabetes mellitus without complications: Secondary | ICD-10-CM | POA: Diagnosis not present

## 2021-10-15 LAB — HM DIABETES EYE EXAM

## 2021-10-17 NOTE — Telephone Encounter (Signed)
Prior auth required for PROLIA  PA PROCESS DETAILS: Precertification is required. Call [phone] or complete the Precertification form available at [email or web address] 

## 2021-10-19 ENCOUNTER — Ambulatory Visit: Payer: Medicare HMO | Admitting: Orthopaedic Surgery

## 2021-10-19 ENCOUNTER — Encounter: Payer: Self-pay | Admitting: Orthopaedic Surgery

## 2021-10-19 DIAGNOSIS — M25511 Pain in right shoulder: Secondary | ICD-10-CM

## 2021-10-19 NOTE — Progress Notes (Signed)
The patient comes in today for follow-up as it relates to her right shoulder.  I saw her recently 4 weeks ago and provided a steroid injection in the right shoulder subacromial outlet.  She said it took about 2 weeks for her to start taking effect and then lasted for about a month.  She is still experiencing anterior shoulder pain and pain with abduction of the shoulder as well as some weakness.  She is 66 years old.  She has also tried other conservative treatment including anti-inflammatories and a home exercise program for the right shoulder.  On today the shoulder moves smoothly and fluidly but does have a lot of pain in the anterior shoulder to palpation and range of motion.  There shows signs of impingement of the shoulder with positive Neer and Hawkins signs.  Her previous x-rays were unremarkable.  There is some weakness with external rotation of the rotator cuff on the right shoulder.  At this standpoint I would like to obtain a MRI of the right shoulder to rule out a rotator cuff tear and to help diagnose the pathology she may be having in that right shoulder that is causing continued pain.  She agrees with this treatment plan.  We will see her back after the MRI.  I recommended her at least try topical Voltaren gel in the interim at least 2-3 times a day for her right shoulder.

## 2021-10-20 ENCOUNTER — Other Ambulatory Visit: Payer: Self-pay

## 2021-10-20 DIAGNOSIS — M25511 Pain in right shoulder: Secondary | ICD-10-CM

## 2021-10-26 ENCOUNTER — Other Ambulatory Visit: Payer: Self-pay | Admitting: Allergy & Immunology

## 2021-10-26 ENCOUNTER — Other Ambulatory Visit: Payer: Self-pay | Admitting: Internal Medicine

## 2021-10-26 DIAGNOSIS — E876 Hypokalemia: Secondary | ICD-10-CM

## 2021-10-27 NOTE — Telephone Encounter (Signed)
PA# [id number] Valid:10/27/21-10/27/22

## 2021-10-27 NOTE — Telephone Encounter (Signed)
Pt ready for scheduling on or after 10/27/21  Out-of-pocket cost due at time of visit: $276  Primary: [employer] Medicare Adv HMO Prolia co-insurance: 20% (approximately $276) Admin fee co-insurance: 20% (approximately $25)  Secondary: n/a Prolia co-insurance:  Admin fee co-insurance:   Deductible: does not apply  Prior Auth: APPROVED PA# [id number] Valid:10/27/21-10/27/22  ** This summary of benefits is an estimation of the patient's out-of-pocket cost. Exact cost may vary based on individual plan coverage.

## 2021-10-27 NOTE — Telephone Encounter (Signed)
Pt has been informed that Prolia was approved. Pt has been made aware that her OOP due at time of visit is $276.  Pt was not at home during the time of call to check her schedule. Pt stated that she would call the office this afternoon to schedule a nurse visit for Prolia.  When pt calls back proceed to schedule.

## 2021-10-28 ENCOUNTER — Telehealth: Payer: Self-pay

## 2021-10-28 NOTE — Telephone Encounter (Signed)
Patient is scheduled for tomorrow but wants to know if she can receive the prolia and flu shot at same time?

## 2021-10-28 NOTE — Telephone Encounter (Signed)
Patient is calling in wondering if she can get scheduled for her prolia and flu shot.

## 2021-10-29 ENCOUNTER — Ambulatory Visit: Payer: Medicare HMO

## 2021-10-29 DIAGNOSIS — M810 Age-related osteoporosis without current pathological fracture: Secondary | ICD-10-CM | POA: Diagnosis not present

## 2021-10-29 DIAGNOSIS — Z23 Encounter for immunization: Secondary | ICD-10-CM | POA: Diagnosis not present

## 2021-10-29 MED ORDER — DENOSUMAB 60 MG/ML ~~LOC~~ SOSY: 60.0000 mg | PREFILLED_SYRINGE | Freq: Once | SUBCUTANEOUS | 0 refills | Status: DC

## 2021-10-29 MED ORDER — DENOSUMAB 60 MG/ML ~~LOC~~ SOSY
60.0000 mg | PREFILLED_SYRINGE | Freq: Once | SUBCUTANEOUS | Status: AC
  Administered 2021-10-29: 60 mg via SUBCUTANEOUS

## 2021-10-29 NOTE — Addendum Note (Signed)
Addended by: Marcina Millard on: 10/29/2021 03:51 PM   Modules accepted: Orders

## 2021-10-30 DIAGNOSIS — F3132 Bipolar disorder, current episode depressed, moderate: Secondary | ICD-10-CM | POA: Diagnosis not present

## 2021-10-30 DIAGNOSIS — R69 Illness, unspecified: Secondary | ICD-10-CM | POA: Diagnosis not present

## 2021-11-02 ENCOUNTER — Institutional Professional Consult (permissible substitution): Payer: Medicare HMO | Admitting: Pulmonary Disease

## 2021-11-09 ENCOUNTER — Ambulatory Visit
Admission: RE | Admit: 2021-11-09 | Discharge: 2021-11-09 | Disposition: A | Discharge status: Home / Self Care | Payer: Medicare HMO | Source: Ambulatory Visit | Attending: Orthopaedic Surgery | Admitting: Orthopaedic Surgery

## 2021-11-09 DIAGNOSIS — M6258 Muscle wasting and atrophy, not elsewhere classified, other site: Secondary | ICD-10-CM | POA: Diagnosis not present

## 2021-11-09 DIAGNOSIS — M75111 Incomplete rotator cuff tear or rupture of right shoulder, not specified as traumatic: Secondary | ICD-10-CM | POA: Diagnosis not present

## 2021-11-09 DIAGNOSIS — M19011 Primary osteoarthritis, right shoulder: Secondary | ICD-10-CM | POA: Diagnosis not present

## 2021-11-09 DIAGNOSIS — M948X1 Other specified disorders of cartilage, shoulder: Secondary | ICD-10-CM | POA: Diagnosis not present

## 2021-11-09 DIAGNOSIS — M25511 Pain in right shoulder: Secondary | ICD-10-CM

## 2021-11-11 ENCOUNTER — Ambulatory Visit (INDEPENDENT_AMBULATORY_CARE_PROVIDER_SITE_OTHER): Payer: Medicare HMO | Admitting: Internal Medicine

## 2021-11-11 ENCOUNTER — Encounter: Payer: Self-pay | Admitting: Internal Medicine

## 2021-11-11 VITALS — BP 126/78 | HR 78 | Temp 98.5°F | Ht 59.0 in | Wt 144.0 lb

## 2021-11-11 DIAGNOSIS — G43109 Migraine with aura, not intractable, without status migrainosus: Secondary | ICD-10-CM

## 2021-11-11 MED ORDER — NURTEC 75 MG PO TBDP: 1.0000 | ORAL_TABLET | ORAL | 1 refills | Status: DC

## 2021-11-11 MED ORDER — PROMETHAZINE HCL 12.5 MG PO TABS: 12.5000 mg | ORAL_TABLET | Freq: Four times a day (QID) | ORAL | 1 refills | Status: DC | PRN

## 2021-11-11 NOTE — Patient Instructions (Signed)
Migraine Headache A migraine headache is an intense, throbbing pain on one side or both sides of the head. Migraine headaches may also cause other symptoms, such as nausea, vomiting, and sensitivity to light and noise. A migraine headache can last from 4 hours to 3 days. Talk with your doctor about what things may bring on (trigger) your migraine headaches. What are the causes? The exact cause of this condition is not known. However, a migraine may be caused when nerves in the brain become irritated and release chemicals that cause inflammation of blood vessels. This inflammation causes pain. This condition may be triggered or caused by: Drinking alcohol. Smoking. Taking medicines, such as: Medicine used to treat chest pain (nitroglycerin). Birth control pills. Estrogen. Certain blood pressure medicines. Eating or drinking products that contain nitrates, glutamate, aspartame, or tyramine. Aged cheeses, chocolate, or caffeine may also be triggers. Doing physical activity. Other things that may trigger a migraine headache include: Menstruation. Pregnancy. Hunger. Stress. Lack of sleep or too much sleep. Weather changes. Fatigue. What increases the risk? The following factors may make you more likely to experience migraine headaches: Being a certain age. This condition is more common in people who are 25-55 years old. Being female. Having a family history of migraine headaches. Being Caucasian. Having a mental health condition, such as depression or anxiety. Being obese. What are the signs or symptoms? The main symptom of this condition is pulsating or throbbing pain. This pain may: Happen in any area of the head, such as on one side or both sides. Interfere with daily activities. Get worse with physical activity. Get worse with exposure to bright lights or loud noises. Other symptoms may include: Nausea. Vomiting. Dizziness. General sensitivity to bright lights, loud noises, or  smells. Before you get a migraine headache, you may get warning signs (an aura). An aura may include: Seeing flashing lights or having blind spots. Seeing bright spots, halos, or zigzag lines. Having tunnel vision or blurred vision. Having numbness or a tingling feeling. Having trouble talking. Having muscle weakness. Some people have symptoms after a migraine headache (postdromal phase), such as: Feeling tired. Difficulty concentrating. How is this diagnosed? A migraine headache can be diagnosed based on: Your symptoms. A physical exam. Tests, such as: CT scan or an MRI of the head. These imaging tests can help rule out other causes of headaches. Taking fluid from the spine (lumbar puncture) and analyzing it (cerebrospinal fluid analysis, or CSF analysis). How is this treated? This condition may be treated with medicines that: Relieve pain. Relieve nausea. Prevent migraine headaches. Treatment for this condition may also include: Acupuncture. Lifestyle changes like avoiding foods that trigger migraine headaches. Biofeedback. Cognitive behavioral therapy. Follow these instructions at home: Medicines Take over-the-counter and prescription medicines only as told by your health care provider. Ask your health care provider if the medicine prescribed to you: Requires you to avoid driving or using heavy machinery. Can cause constipation. You may need to take these actions to prevent or treat constipation: Drink enough fluid to keep your urine pale yellow. Take over-the-counter or prescription medicines. Eat foods that are high in fiber, such as beans, whole grains, and fresh fruits and vegetables. Limit foods that are high in fat and processed sugars, such as fried or sweet foods. Lifestyle Do not drink alcohol. Do not use any products that contain nicotine or tobacco, such as cigarettes, e-cigarettes, and chewing tobacco. If you need help quitting, ask your health care  provider. Get at least 8   hours of sleep every night. Find ways to manage stress, such as meditation, deep breathing, or yoga. General instructions     Keep a journal to find out what may trigger your migraine headaches. For example, write down: What you eat and drink. How much sleep you get. Any change to your diet or medicines. If you have a migraine headache: Avoid things that make your symptoms worse, such as bright lights. It may help to lie down in a dark, quiet room. Do not drive or use heavy machinery. Ask your health care provider what activities are safe for you while you are experiencing symptoms. Keep all follow-up visits as told by your health care provider. This is important. Contact a health care provider if: You develop symptoms that are different or more severe than your usual migraine headache symptoms. You have more than 15 headache days in one month. Get help right away if: Your migraine headache becomes severe. Your migraine headache lasts longer than 72 hours. You have a fever. You have a stiff neck. You have vision loss. Your muscles feel weak or like you cannot control them. You start to lose your balance often. You have trouble walking. You faint. You have a seizure. Summary A migraine headache is an intense, throbbing pain on one side or both sides of the head. Migraines may also cause other symptoms, such as nausea, vomiting, and sensitivity to light and noise. This condition may be treated with medicines and lifestyle changes. You may also need to avoid certain things that trigger a migraine headache. Keep a journal to find out what may trigger your migraine headaches. Contact your health care provider if you have more than 15 headache days in a month or you develop symptoms that are different or more severe than your usual migraine headache symptoms. This information is not intended to replace advice given to you by your health care provider. Make sure  you discuss any questions you have with your health care provider. Document Revised: 05/19/2018 Document Reviewed: 03/09/2018 Elsevier Patient Education  2023 [employer]  

## 2021-11-11 NOTE — Progress Notes (Signed)
Subjective:  Patient ID: [name], female    DOB: Mar 15, 1955  Age: 66 y.o. MRN: [id number]  CC: Headache   HPI [name] presents for f/up -  She requests a prescription of phenergan for nausea without vomiting for migraine headaches.She gets some sx relief with maxalt. She has a headache about 2 times per week and they can last for 2-3 days.  Outpatient Medications Prior to Visit  Medication Sig Dispense Refill   albuterol (VENTOLIN HFA) 108 (90 Base) MCG/ACT inhaler INHALE 1-2 PUFFS INTO THE LUNGS DAILY AS NEEDED FOR WHEEZING OR SHORTNESS OF BREATH. 18 each 1   Carbinoxamine Maleate 4 MG TABS TAKE 1 TABLET BY MOUTH EVERY 6 HOURS. 180 tablet 1   cariprazine (VRAYLAR) 3 MG capsule      clonazePAM (KLONOPIN) 0.5 MG tablet Take 0.5 mg by mouth 2 (two) times daily as needed.     DULoxetine (CYMBALTA) 60 MG capsule Take 60 mg by mouth 2 (two) times daily at 10 AM and 5 PM.     fluticasone (FLONASE) 50 MCG/ACT nasal spray PLACE 1 SPRAY INTO BOTH NOSTRILS 2 (TWO) TIMES DAILY AS NEEDED FOR ALLERGIES OR RHINITIS. 16 mL 5   fluticasone (FLOVENT HFA) 220 MCG/ACT inhaler DURING RESPIRATORY INFECTIONS OR WORSENING SYMPTOMS INHALE 4 PUFFS 2 TIMES DAILY FOR UP TO TWO WEEKS 12 g 5   hydrocortisone 2.5 % ointment Apply topically 2 (two) times daily.     ketoconazole (NIZORAL) 2 % cream Apply topically daily.      levothyroxine (SYNTHROID) 25 MCG tablet Take 1 tablet (25 mcg total) by mouth daily. 90 tablet 1   metoprolol tartrate (LOPRESSOR) 25 MG tablet TAKE 1 TABLET BY MOUTH TWICE A DAY 180 tablet 0   montelukast (SINGULAIR) 10 MG tablet TAKE 1 TABLET BY MOUTH EVERYDAY AT BEDTIME 90 tablet 1   MYRBETRIQ 50 MG TB24 tablet Take 50 mg by mouth daily.     rizatriptan (MAXALT-MLT) 10 MG disintegrating tablet TAKE 1 TABLET EVERY DAY AS NEEDED FOR MIGRAINE, MAY REPEAT IN 2 HRS IF NEEDED. MAX 2 DOSES/WK 9 tablet 2   rosuvastatin (CRESTOR) 20 MG tablet Take 1 tablet (20 mg total) by mouth daily.  90 tablet 1   spironolactone (ALDACTONE) 25 MG tablet TAKE 1 TABLET (25 MG TOTAL) BY MOUTH DAILY. 90 tablet 0   Suvorexant (BELSOMRA) 10 MG TABS Take 10 mg by mouth at bedtime.     trihexyphenidyl (ARTANE) 2 MG tablet Take 1 tablet (2 mg total) by mouth daily. 90 tablet 1   zinc gluconate 50 MG tablet Take 50 mg by mouth daily.     Ascorbic Acid (VITAMIN C CR) 500 MG CPCR Take by mouth.     Cinnamon 500 MG capsule Cinnamon 500 mg capsule     Azelastine HCl 0.15 % SOLN Place 1-2 sprays into both nostrils 2 (two) times daily. 12 mL 0   budesonide-formoterol (SYMBICORT) 160-4.5 MCG/ACT inhaler Inhale 2 puffs into the lungs in the morning and at bedtime. 3 each 5   zolpidem (AMBIEN) 10 MG tablet Take 10 mg by mouth at bedtime.     No facility-administered medications prior to visit.    ROS Review of Systems  Constitutional:  Negative for chills, diaphoresis, fatigue and fever.  HENT: Negative.    Eyes: Negative.   Respiratory: Negative.  Negative for cough, chest tightness, shortness of breath and wheezing.   Cardiovascular:  Negative for chest pain, palpitations and leg swelling.  Gastrointestinal:  Negative for abdominal pain, diarrhea and vomiting.  Endocrine: Negative.   Genitourinary: Negative.  Negative for difficulty urinating.  Musculoskeletal:  Negative for arthralgias and myalgias.  Skin: Negative.   Neurological:  Positive for headaches. Negative for dizziness, weakness, light-headedness and numbness.  Hematological:  Negative for adenopathy. Does not bruise/bleed easily.  Psychiatric/Behavioral: Negative.      Objective:  BP 126/78 (BP Location: Right Arm, Patient Position: Sitting, Cuff Size: Large)   Pulse 78   Temp 98.5 F (36.9 C) (Oral)   Ht '4\' 11"'$  (1.499 m)   Wt 144 lb (65.3 kg)   LMP 03/05/2012 (Approximate)   SpO2 93%   BMI 29.08 kg/m   BP Readings from Last 3 Encounters:  11/11/21 126/78  08/24/21 128/78  06/09/21 128/70    Wt Readings from Last 3  Encounters:  11/11/21 144 lb (65.3 kg)  08/24/21 145 lb (65.8 kg)  06/09/21 153 lb 3.2 oz (69.5 kg)    Physical Exam Vitals reviewed.  HENT:     Nose: Nose normal.     Mouth/Throat:     Mouth: Mucous membranes are moist.  Eyes:     General: No scleral icterus.    Conjunctiva/sclera: Conjunctivae normal.  Cardiovascular:     Rate and Rhythm: Normal rate and regular rhythm.     Heart sounds: No murmur heard. Pulmonary:     Effort: Pulmonary effort is normal.     Breath sounds: No stridor. No wheezing, rhonchi or rales.  Abdominal:     General: Abdomen is flat.     Palpations: There is no mass.     Tenderness: There is no abdominal tenderness. There is no guarding.     Hernia: No hernia is present.  Musculoskeletal:        General: Normal range of motion.     Cervical back: Neck supple.     Right lower leg: No edema.     Left lower leg: No edema.  Lymphadenopathy:     Cervical: No cervical adenopathy.  Skin:    General: Skin is warm and dry.  Neurological:     General: No focal deficit present.     Mental Status: She is alert. Mental status is at baseline.  Psychiatric:        Mood and Affect: Mood normal.        Behavior: Behavior normal.     Lab Results  Component Value Date   WBC 6.9 08/24/2021   HGB 14.0 08/24/2021   HCT 41.6 08/24/2021   PLT 207.0 08/24/2021   GLUCOSE 122 (H) 08/24/2021   CHOL 258 (H) 03/09/2021   TRIG 116.0 03/09/2021   HDL 71.90 03/09/2021   LDLDIRECT 143.0 09/14/2016   LDLCALC 163 (H) 03/09/2021   ALT 16 03/09/2021   AST 22 03/09/2021   NA 139 08/24/2021   K 3.9 08/24/2021   CL 101 08/24/2021   CREATININE 0.96 08/24/2021   BUN 14 08/24/2021   CO2 29 08/24/2021   TSH 0.84 06/03/2021   INR 0.94 12/21/2017   HGBA1C 5.6 08/24/2021   MICROALBUR 1.3 08/24/2021    No results found.  Assessment & Plan:   [name] was seen today for headache.  Diagnoses and all orders for this visit:  Migraine with aura and without status  migrainosus, not intractable- Will prophylax against migraines with a CGRP-antagonist. -     Rimegepant Sulfate (NURTEC) 75 MG TBDP; Take 1 tablet by mouth every other day. -     promethazine (PHENERGAN)  12.5 MG tablet; Take 1 tablet (12.5 mg total) by mouth every 6 (six) hours as needed for nausea or vomiting.   I have discontinued [name]'s Vitamin C CR, Cinnamon, and zolpidem. I am also having her start on Nurtec and promethazine. Additionally, I am having her maintain her ketoconazole, zinc gluconate, hydrocortisone, Azelastine HCl, Myrbetriq, clonazePAM, budesonide-formoterol, Carbinoxamine Maleate, cariprazine, DULoxetine, trihexyphenidyl, levothyroxine, fluticasone, metoprolol tartrate, fluticasone, rizatriptan, rosuvastatin, montelukast, spironolactone, albuterol, and Belsomra.  Meds ordered this encounter  Medications   Rimegepant Sulfate (NURTEC) 75 MG TBDP    Sig: Take 1 tablet by mouth every other day.    Dispense:  46 tablet    Refill:  1   promethazine (PHENERGAN) 12.5 MG tablet    Sig: Take 1 tablet (12.5 mg total) by mouth every 6 (six) hours as needed for nausea or vomiting.    Dispense:  30 tablet    Refill:  1     Follow-up: Return in about 3 months (around 02/11/2022).  [name], MD

## 2021-11-18 ENCOUNTER — Institutional Professional Consult (permissible substitution): Payer: Medicare HMO | Admitting: Pulmonary Disease

## 2021-11-19 ENCOUNTER — Telehealth: Payer: Self-pay

## 2021-11-19 ENCOUNTER — Telehealth: Payer: Self-pay | Admitting: Internal Medicine

## 2021-11-19 DIAGNOSIS — G43109 Migraine with aura, not intractable, without status migrainosus: Secondary | ICD-10-CM

## 2021-11-19 MED ORDER — NURTEC 75 MG PO TBDP: 1.0000 | ORAL_TABLET | ORAL | 1 refills | Status: DC

## 2021-11-19 NOTE — Telephone Encounter (Signed)
Key: [id number]

## 2021-11-19 NOTE — Telephone Encounter (Signed)
Patient's [facility]tec [facility] by [facility] - this rx should be sent to [facility] [address] at [facility].  Patient was seen in office on 11/11/2021.  Patient would like to be notified as soon as this is corrected.

## 2021-11-19 NOTE — Telephone Encounter (Signed)
Approved 03/11/2021 - 02/07/2022

## 2021-11-25 ENCOUNTER — Encounter: Payer: Self-pay | Admitting: Orthopaedic Surgery

## 2021-11-25 ENCOUNTER — Ambulatory Visit: Payer: Medicare HMO | Admitting: Orthopaedic Surgery

## 2021-11-25 DIAGNOSIS — M75111 Incomplete rotator cuff tear or rupture of right shoulder, not specified as traumatic: Secondary | ICD-10-CM | POA: Diagnosis not present

## 2021-11-25 DIAGNOSIS — M7541 Impingement syndrome of right shoulder: Secondary | ICD-10-CM | POA: Diagnosis not present

## 2021-11-25 DIAGNOSIS — M25511 Pain in right shoulder: Secondary | ICD-10-CM | POA: Diagnosis not present

## 2021-11-25 DIAGNOSIS — M24011 Loose body in right shoulder: Secondary | ICD-10-CM | POA: Diagnosis not present

## 2021-11-25 NOTE — Progress Notes (Signed)
The patient is a 66 year old female who comes in to go over an MRI of her right shoulder after dealing with shoulder pain for few months now.  She also reports weakness of that shoulder with no trauma.  On exam there is no grinding of the glenohumeral joint.  There is pain in the anterior shoulder and evidence of impingement.  There is some weakness of the rotator cuff with abduction and external rotation.  There is only some slight atrophy of the shoulder girdle.  She can abduct her shoulder on her own.  The MRI of the right shoulder shows significant subacromial subdeltoid bursitis.  There is a loose body in the anterior aspect of the shoulder in the subcu deltoid and subacromial bursa area.  There is partial thickness tearing of the supraspinatus tendon.  There is moderate arthritis of the glenohumeral joint.  The shoulder itself is not high riding and there is only slight atrophy of the muscles.  At this point given the very conservative treatment including activity modification, shoulder exercises and steroid injections combined with the patient's shoulder weakness, continued pain and MRI findings, we are recommending an arthroscopic intervention of the right shoulder.  I went over her MRI findings with her and showed her a shoulder model.  I explained in detail what this type of surgery involves.  We discussed the risk and benefits of surgery and what to expect from an intraoperative and postoperative course.  All questions and concerns were answered and addressed.  We will work on getting this scheduled as an outpatient and then see her back in 1 week after surgery.

## 2021-12-01 ENCOUNTER — Other Ambulatory Visit: Payer: Self-pay | Admitting: Internal Medicine

## 2021-12-01 DIAGNOSIS — I10 Essential (primary) hypertension: Secondary | ICD-10-CM

## 2021-12-01 DIAGNOSIS — I493 Ventricular premature depolarization: Secondary | ICD-10-CM

## 2021-12-06 NOTE — Telephone Encounter (Signed)
Last Prolia inj 10/29/21 Next Prolia inj due 04/30/22

## 2021-12-07 DIAGNOSIS — R69 Illness, unspecified: Secondary | ICD-10-CM | POA: Diagnosis not present

## 2021-12-07 DIAGNOSIS — F3132 Bipolar disorder, current episode depressed, moderate: Secondary | ICD-10-CM | POA: Diagnosis not present

## 2021-12-07 DIAGNOSIS — F410 Panic disorder [episodic paroxysmal anxiety] without agoraphobia: Secondary | ICD-10-CM | POA: Diagnosis not present

## 2021-12-08 ENCOUNTER — Ambulatory Visit (INDEPENDENT_AMBULATORY_CARE_PROVIDER_SITE_OTHER): Payer: Medicare HMO | Admitting: Pulmonary Disease

## 2021-12-08 ENCOUNTER — Encounter: Payer: Self-pay | Admitting: Pulmonary Disease

## 2021-12-08 VITALS — BP 126/80 | HR 90 | Temp 98.7°F | Ht 59.0 in | Wt 149.0 lb

## 2021-12-08 DIAGNOSIS — G4733 Obstructive sleep apnea (adult) (pediatric): Secondary | ICD-10-CM

## 2021-12-08 NOTE — Progress Notes (Signed)
[name]    [id number]    09-16-1955  Primary Care Physician:Jones, Arvid Right, MD  Referring Physician: Janith Lima, MD [address] [place],  [place] [address]  Chief complaint:   Patient being seen for history of obstructive sleep apnea  HPI:  Sleep apnea diagnosed in 2017, noted to have mild sleep apnea Was on CPAP for many years  Patient had a fall about March, had injury to her nasal bridge and has not been able to tolerate CPAP since then  Has managed to lose about 50 pounds in the last year.  Home sleep study in 2017 did reveal an AHI of 12.8  She does have some difficulty initiating sleep and wakes up a few times to use the bathroom She does require Belsomra to help her sleep sometimes takes up to 1 hour to fall asleep Usually goes to bed between 10 and 11 She has a dry mouth almost always, no morning headaches No significant memory issues  Her health has been relatively good the last year  She is not aware whether she still snores Has tried multiple times to use the CPAP since the nasal bridge injury and has not been able to, feels she may be feeling a little claustrophobic with trying CPAP  Outpatient Encounter Medications as of 12/08/2021  Medication Sig   albuterol (VENTOLIN HFA) 108 (90 Base) MCG/ACT inhaler INHALE 1-2 PUFFS INTO THE LUNGS DAILY AS NEEDED FOR WHEEZING OR SHORTNESS OF BREATH.   Azelastine HCl 0.15 % SOLN Place 1-2 sprays into both nostrils 2 (two) times daily.   budesonide-formoterol (SYMBICORT) 160-4.5 MCG/ACT inhaler Inhale 2 puffs into the lungs in the morning and at bedtime.   Carbinoxamine Maleate 4 MG TABS TAKE 1 TABLET BY MOUTH EVERY 6 HOURS.   cariprazine (VRAYLAR) 3 MG capsule    clonazePAM (KLONOPIN) 0.5 MG tablet Take 0.5 mg by mouth 2 (two) times daily as needed.   DULoxetine (CYMBALTA) 60 MG capsule Take 60 mg by mouth 2 (two) times daily at 10 AM and 5 PM.   fluticasone (FLONASE) 50 MCG/ACT nasal spray  PLACE 1 SPRAY INTO BOTH NOSTRILS 2 (TWO) TIMES DAILY AS NEEDED FOR ALLERGIES OR RHINITIS.   fluticasone (FLOVENT HFA) 220 MCG/ACT inhaler DURING RESPIRATORY INFECTIONS OR WORSENING SYMPTOMS INHALE 4 PUFFS 2 TIMES DAILY FOR UP TO TWO WEEKS   hydrocortisone 2.5 % ointment Apply topically 2 (two) times daily.   ketoconazole (NIZORAL) 2 % cream Apply topically daily.    levothyroxine (SYNTHROID) 25 MCG tablet Take 1 tablet (25 mcg total) by mouth daily.   metoprolol tartrate (LOPRESSOR) 25 MG tablet TAKE 1 TABLET BY MOUTH TWICE A DAY   montelukast (SINGULAIR) 10 MG tablet TAKE 1 TABLET BY MOUTH EVERYDAY AT BEDTIME   MYRBETRIQ 50 MG TB24 tablet Take 50 mg by mouth daily.   promethazine (PHENERGAN) 12.5 MG tablet Take 1 tablet (12.5 mg total) by mouth every 6 (six) hours as needed for nausea or vomiting.   Rimegepant Sulfate (NURTEC) 75 MG TBDP Take 1 tablet by mouth every other day.   rizatriptan (MAXALT-MLT) 10 MG disintegrating tablet TAKE 1 TABLET EVERY DAY AS NEEDED FOR MIGRAINE, MAY REPEAT IN 2 HRS IF NEEDED. MAX 2 DOSES/WK   rosuvastatin (CRESTOR) 20 MG tablet Take 1 tablet (20 mg total) by mouth daily.   spironolactone (ALDACTONE) 25 MG tablet TAKE 1 TABLET (25 MG TOTAL) BY MOUTH DAILY.   Suvorexant (BELSOMRA) 10 MG  TABS Take 10 mg by mouth at bedtime.   trihexyphenidyl (ARTANE) 2 MG tablet Take 1 tablet (2 mg total) by mouth daily.   zinc gluconate 50 MG tablet Take 50 mg by mouth daily.   No facility-administered encounter medications on file as of 12/08/2021.    Allergies as of 12/08/2021 - Review Complete 12/08/2021  Allergen Reaction Noted   Emetrol Itching 08/21/2018   Indomethacin Other (See Comments) 08/17/2016   Iodinated contrast media Itching 04/16/2011   Amlodipine Swelling 12/26/2014   Carbamazepine Other (See Comments) 04/28/2018   Pseudoephedrine Other (See Comments) 06/20/2014   Risperidone and related Other (See Comments) 12/12/2012   Bupivacaine Hives 05/30/2013    Pentazocine Other (See Comments) 06/20/2014   Propoxyphene Itching and Nausea And Vomiting 06/20/2014   Sulfa antibiotics Other (See Comments) 05/24/2017   Tramadol Other (See Comments) 06/30/2016   Aspirin Other (See Comments) 02/15/2014   Codeine Itching and Other (See Comments) 05/30/2013   Etodolac Rash and Other (See Comments) 06/20/2014   Propranolol Nausea Only and Other (See Comments) 12/12/2012    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   Hypothyroidism    Intermittent palpitations    cardiology--  dr Martinique   Interstitial cystitis    Limited jaw range of motion    s/p  bilateral TMJ surgery,  age 41s   Migraines    Moderate persistent asthma    pulmologist-- dr Halford Chessman   OA (osteoarthritis)    both knees   OSA on CPAP    per last sleep study 09/ 2017  mild OSA, AHI13/hr   PONV (postoperative nausea and vomiting)    PVC (premature ventricular contraction)    Rosacea    Sensation of pressure in bladder area    per pt intermittant   TMJ arthralgia    Urticaria    Wears glasses    White coat syndrome without hypertension    05-25-2017  per pt hx hypertension yrs ago, no issues after quitting stressful job    Past Surgical History:  Procedure Laterality Date   24 HOUR [place] STUDY N/A 09/22/2015   Procedure: 24 HOUR [place] STUDY;  Surgeon: Doran Stabler, MD;  Location: [facility];  Service: Gastroenterology;  Laterality: N/A;   ADENOIDECTOMY     ANAL FISSURE  REPAIR  age 55  (21)   BREAST EXCISIONAL BIOPSY Right 04-16-2003  dr p. young  [facility]   benign   BUNIONECTOMY Right early 2000s   CARPAL TUNNEL RELEASE Left age 25 (32)   CHOLECYSTECTOMY OPEN  age 39   CYSTO WITH HYDRODISTENSION N/A 06/02/2017   Procedure: CYSTOSCOPY/HYDRODISTENSION OF BLADDER;  Surgeon: Irine Seal, MD;  Location: [facility];  Service: Urology;  Laterality: N/A;   CYSTO/ HYDRODISTENTION/  INSTILLATION THERAPY  1990s   DX LAPAROSCOPY/  DX HYSTEROSCOPY/  D & C  08-07-2007   dr dove  [facility]   ENDOMETRIAL ABLATION W/ NOVASURE  10-21-2008   dr Harolyn Rutherford  [facility]   ESOPHAGEAL MANOMETRY N/A 09/22/2015   Procedure: ESOPHAGEAL MANOMETRY (EM);  Surgeon: Doran Stabler, MD;  Location: [facility];  Service: Gastroenterology;  Laterality: N/A;  with impedence    FINGER ARTHROPLASTY Bilateral    left little finger 2016:  right middle finger 06/ 2018   KNEE ARTHROSCOPY Bilateral x3 left ;  x3  right-- last one age 39 (69)   NASAL SEPTUM SURGERY  age 8s   ROBOT ASSISTED REDUCTION PARAESOPHAGEAL HIATAL HERNIA/ TYPE 2 MEDIASTINAL DISSECTION/ PRIMARY HIATAL HERNIA REPAIR/ ANTERIOR & POSTERIOR GASTROPEXY/ NISSEN FUNDOPLICATION  [id number]   DR GROSS  [facility]   SINOSCOPY     TEMPOROMANDIBULAR JOINT SURGERY Bilateral x3  -- age 68s   per pt used graft   TONSILLECTOMY     TOTAL KNEE ARTHROPLASTY Left 12/26/2017   Procedure: LEFT TOTAL KNEE ARTHROPLASTY;  Surgeon: Gaynelle Arabian, MD;  Location: [facility];  Service: Orthopedics;  Laterality: Left;  9mn   TRANSTHORACIC ECHOCARDIOGRAM  12-06-2017   dr jMartinique  ef 55-60%, grade 1 diastoilc dysfunction, trivial MR   TRIGGER FINGER RELEASE Bilateral last one 2017   several release's bilaterally   ULNAR NERVE TRANSPOSITION Bilateral age 66((30    Family History  Problem Relation Age of Onset   Hypertension Mother        deceased from MVA complications   Thyroid disease Mother    Allergic rhinitis Mother    Testicular cancer Father     Allergic rhinitis Father    Colon cancer Sister    Colon polyps Sister    Healthy Sister    Healthy Brother    Coronary artery disease Other    Stomach cancer Neg Hx     Social History   Socioeconomic History   Marital status: Divorced    Spouse name: Not on file   Number of children: 0   Years of education: Not on file   Highest education level: Bachelor's degree (e.g., BA, AB, BS)  Occupational History   Occupation: disability  Tobacco Use   Smoking status: Never   Smokeless tobacco: Never  Vaping Use   Vaping Use: Never used  Substance and Sexual Activity   Alcohol use: Yes    Alcohol/week: 0.0 standard drinks of alcohol    Comment: once every 3-4 months   Drug use: No   Sexual activity: Not Currently    Partners: Male    Birth control/protection: Post-menopausal  Other Topics Concern   Not on file  Social History Narrative   Not on file   Social Determinants of Health   Financial Resource Strain: Low Risk  (03/24/2021)   Overall Financial Resource Strain (CARDIA)    Difficulty of Paying Living Expenses: Not hard at all  Food Insecurity: No Food Insecurity (03/24/2021)   Hunger Vital Sign    Worried About Running Out of Food in the Last Year: Never true    Ran Out of Food in the Last Year: Never true  Transportation Needs: No Transportation Needs (03/24/2021)   PRAPARE - THydrologist(Medical): No    Lack of Transportation (Non-Medical): No  Physical Activity: Insufficiently Active (03/24/2021)   Exercise Vital Sign    Days of Exercise per Week: 7 days    Minutes of Exercise per Session: 20 min  Stress: Stress Concern Present (  03/24/2021)   [place]    Feeling of Stress : To some extent  Social Connections: Moderately Integrated (03/24/2021)   Social Connection and Isolation Panel [NHANES]    Frequency of Communication with Friends and Family: More than three  times a week    Frequency of Social Gatherings with Friends and Family: Twice a week    Attends Religious Services: More than 4 times per year    Active Member of [employer] or Organizations: Yes    Attends Archivist Meetings: More than 4 times per year    Marital Status: Divorced  Intimate Partner Violence: Not At Risk (03/24/2021)   Humiliation, Afraid, Rape, and Kick questionnaire    Fear of Current or Ex-Partner: No    Emotionally Abused: No    Physically Abused: No    Sexually Abused: No    Review of Systems  Respiratory:  Positive for apnea. Negative for shortness of breath.   Psychiatric/Behavioral:  Positive for sleep disturbance.     Vitals:   12/08/21 1044  BP: 126/80  Pulse: 90  Temp: 98.7 F (37.1 C)  SpO2: 100%     Physical Exam Constitutional:      Appearance: She is obese.  HENT:     Head: Normocephalic.     Mouth/Throat:     Mouth: Mucous membranes are moist.     Comments: Mallampati 3 Eyes:     Conjunctiva/sclera: Conjunctivae normal.  Cardiovascular:     Rate and Rhythm: Normal rate and regular rhythm.     Heart sounds: No murmur heard.    No friction rub.  Pulmonary:     Effort: No respiratory distress.     Breath sounds: No stridor. No wheezing or rhonchi.  Musculoskeletal:     Cervical back: No rigidity or tenderness.  Neurological:     Mental Status: She is alert.  Psychiatric:        Mood and Affect: Mood normal.      Data Reviewed: Previous sleep study from 2017 reviewed showing AHI of 12.8  Assessment:  History of mild obstructive sleep apnea  Intolerance of CPAP therapy  Significant weight loss since last study  Pathophysiology of sleep disordered breathing was discussed with the patient  Treatment options for sleep disordered breathing discussed with the patient  With significant weight loss over the last year, and patient may no longer have significant obstructive sleep apnea  Plan/Recommendations: Patient will  benefit from an in lab sleep study  Because the likelihood of significant sleep apnea is not as high, home sleep study will not be appropriate  Encouraged to continue weight loss efforts  Encouraged to call with significant concerns  Follow-up in about 3 months   Sherrilyn Rist MD [facility] 12/08/2021, 11:13 AM  CC: Janith Lima, MD

## 2021-12-08 NOTE — Patient Instructions (Signed)
Past history of mild obstructive sleep apnea  With significant weight loss you may not have significant sleep apnea any longer  Starting point would be to confirm whether you still need CPAP therapy.  Options of treatment as we discussed  If very mild, focusing on weight management may suffice as long as you are not having significant symptoms  Continue with Belsomra  We will schedule you for a sleep study in the sleep lab  Tentative follow-up in about 3 months

## 2021-12-10 ENCOUNTER — Other Ambulatory Visit: Payer: Self-pay | Admitting: Allergy & Immunology

## 2021-12-10 ENCOUNTER — Encounter: Payer: Self-pay | Admitting: Allergy & Immunology

## 2021-12-10 ENCOUNTER — Telehealth: Payer: Self-pay

## 2021-12-10 ENCOUNTER — Other Ambulatory Visit: Payer: Self-pay

## 2021-12-10 ENCOUNTER — Ambulatory Visit: Payer: Medicare HMO | Admitting: Allergy & Immunology

## 2021-12-10 VITALS — BP 118/70 | HR 83 | Temp 98.3°F | Resp 16

## 2021-12-10 DIAGNOSIS — J4540 Moderate persistent asthma, uncomplicated: Secondary | ICD-10-CM

## 2021-12-10 DIAGNOSIS — J3089 Other allergic rhinitis: Secondary | ICD-10-CM

## 2021-12-10 DIAGNOSIS — G43109 Migraine with aura, not intractable, without status migrainosus: Secondary | ICD-10-CM

## 2021-12-10 DIAGNOSIS — J302 Other seasonal allergic rhinitis: Secondary | ICD-10-CM

## 2021-12-10 MED ORDER — FLUTICASONE PROPIONATE 50 MCG/ACT NA SUSP: 1.0000 | Freq: Two times a day (BID) | NASAL | 1 refills | Status: DC | PRN

## 2021-12-10 MED ORDER — CARBINOXAMINE MALEATE 4 MG PO TABS: 1.0000 | ORAL_TABLET | Freq: Four times a day (QID) | ORAL | 5 refills | Status: DC

## 2021-12-10 MED ORDER — BUDESONIDE-FORMOTEROL FUMARATE 160-4.5 MCG/ACT IN AERO: 2.0000 | INHALATION_SPRAY | Freq: Two times a day (BID) | RESPIRATORY_TRACT | 5 refills | Status: DC

## 2021-12-10 MED ORDER — MONTELUKAST SODIUM 10 MG PO TABS: 10.0000 mg | ORAL_TABLET | Freq: Every day | ORAL | 1 refills | Status: DC

## 2021-12-10 MED ORDER — AZELASTINE HCL 0.15 % NA SOLN: 1.0000 | Freq: Two times a day (BID) | NASAL | 5 refills | Status: DC

## 2021-12-10 NOTE — Telephone Encounter (Signed)
PA request received for Carbinoxamine Maleate '4MG'$  tablets from [employer] through [facility].  PA has been submitted and is awaiting determination.   Key: [id number] - PA Case ID: [id number]

## 2021-12-10 NOTE — Patient Instructions (Addendum)
1. Moderate persistent asthma, uncomplicated - Spirometry looked good today. - Daily controller medication(s): Singulair '10mg'$  daily and Symbicort 165mg 2 puffs twice daily with spacer - Prior to physical activity: albuterol 2 puffs 10-15 minutes before physical activity. - Rescue medications: albuterol 4 puffs every 4-6 hours as needed - Changes during respiratory infections or worsening symptoms: Add on Flovent 2286m to 4 puffs  2 times daily for TWO WEEKS. - Asthma control goals:  * Full participation in all desired activities (may need albuterol before activity) * Albuterol use two time or less a week on average (not counting use with activity) * Cough interfering with sleep two time or less a month * Oral steroids no more than once a year * No hospitalizations  2. Seasonal and perennial allergic rhinitis - Continue with Singulair '10mg'$  once daily. - Continue with carbinoxamine up to three times daily. - Continue with azelastine one spray per nostril up to TWICE as needed depending on your symptoms.  - Continue with Pataday eye drops twice daily as needed.  3. Return in about 6 months (around 06/10/2022).    Please inform usKoreaf any Emergency Department visits, hospitalizations, or changes in symptoms. Call usKoreaefore going to the ED for breathing or allergy symptoms since we might be able to fit you in for a sick visit. Feel free to contact usKoreanytime with any questions, problems, or concerns.  It was a pleasure to see you again today! I hope you heal fully!   Websites that have reliable patient information: 1. American Academy of Asthma, Allergy, and Immunology: www.aaaai.org 2. Food Allergy Research and Education (FARE): foodallergy.org 3. Mothers of Asthmatics: http://www.asthmacommunitynetwork.org 4. American College of Allergy, Asthma, and Immunology: www.acaai.org   COVID-19 Vaccine Information can be found at:  [email or web address] questions related to vaccine distribution or appointments, please email [email or web address] or call [phone]  We realize that you might be concerned about having an allergic reaction to the COVID19 vaccines. To help with that concern, WE ARE OFFERING THE COVID19 VACCINES IN OUR OFFICE! Ask the front desk for dates!     "Like" usKorean Facebook and Instagram for our latest updates!      A healthy democracy works best when ALNew York Life Insurancearticipate! Make sure you are registered to vote! If you have moved or changed any of your contact information, you will need to get this updated before voting!  In some cases, you MAY be able to register to vote online: [email or web address]

## 2021-12-10 NOTE — Progress Notes (Signed)
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 Moderate persistent asthma, uncomplicated - Spirometry looked good today. - Daily controller medication(s): Singulair '10mg'$  daily and Symbicort 173mg 2 puffs twice daily with spacer - Prior to physical activity: albuterol 2 puffs 10-15 minutes before physical activity. - Rescue medications: albuterol 4 puffs every 4-6 hours as needed - Changes during respiratory infections or worsening symptoms: Add on Flovent 2229m to 4 puffs  2 times daily for TWO WEEKS. - Asthma control goals:  * Full participation in all desired activities (may need albuterol before activity) * Albuterol use two time or less a week on average (not counting use with activity) * Cough interfering with sleep two time or less a month * Oral steroids no more than once a year * No hospitalizations  2. Seasonal and perennial allergic rhinitis - Continue with Singulair '10mg'$  once daily. - Continue with carbinoxamine up to three times daily. - Continue with azelastine one spray per nostril up to TWICE as needed depending on your symptoms.  - Continue with Pataday eye drops twice daily as needed.  3. Return in about 6 months (around 06/10/2022).     Subjective:   SuALFA LEIBENSPERGERs a [age].o. female presenting today for follow up of  Chief Complaint  Patient presents with   Asthma    SuKAHO SELLEas a history of the following: Patient Active Problem List   Diagnosis Date Noted   Hyperlipidemia LDL goal <100 08/24/2021   Encounter for general adult medical examination with abnormal findings 03/14/2021   Estrogen deficiency 03/09/2021   Cervical cancer screening 03/09/2021   Paradoxical vocal fold  movement 06/11/2020   Grade I diastolic dysfunction 0218/84/1660 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HCClarkton06/18/2021   Osteoarthritis of glenohumeral joint 03/26/2019   Iron deficiency anemia 04/28/2018   OA (osteoarthritis) of knee 12/26/2017   PVC (premature ventricular contraction) 11/21/2017   Dysphonia 09/26/2017   Vitamin D deficiency, unspecified 10/13/2016   Primary osteoarthritis of left hand 06/30/2016   Osteoporosis without current pathological fracture 03/22/2016   Paraesophageal hiatal hernia s/p robotic repair & fundoplication 02/14/28/1601109/32/3557 Bipolar 1 disorder, mixed, moderate (HCNormanna12/21/2017   Class 1 obesity with body mass index (BMI) of 32.0 to 32.9 in adult 01/06/2016   Migraines 11/21/2014   OSA on CPAP 01/30/2007   GERD 11/02/2006   DERMATITIS, HANDS 11/02/2006   Hypothyroidism 10/07/2006   Bipolar affective (HCCamino [place]08/29/2008   Essential hypertension 10/07/2006   RHINITIS, CHRONIC 10/07/2006   Seasonal and perennial allergic rhinitis 10/07/2006   Moderate persistent asthma, uncomplicated 0832/20/2542 Irritable bowel syndrome 10/07/2006   INTERSTITIAL CYSTITIS 10/07/2006   ROSACEA 10/07/2006   Osteoarthritis of both knees 10/07/2006   Disturbance in sleep behavior 10/07/2006    History obtained from: chart review and patient.  SuAneiras a [age].o. female presenting for a follow up visit. She was last seen in May 2023. At that time, spirometry was normal. We provided her with a new spacer. We continued with Singulair as well as Symbicort two puffs twice daily. For her PSAR, we continued with montelukast as well as carbinoxamine and azelastine as needed. We also continued with prednisone. We also started with Augmentin  twice daily.   Since the last visit, he has done well. She has a tickle in her throat today. She reports that she had a tickle in her throat. This has been ongoing since she came in here.   Asthma/Respiratory Symptom History: She remains on the  the Symbicort. She has been compliant with this. Autumn is typically the worst time of the year for her symptoms. It does generally improve after it gets below freezing. She did have a problem with hiking last year when she was in the leaves.  Symbicort is affordable. She is getting it for $0 and she is hoping that this is going to last for the next year. She is still on the montelukast.   Allergic Rhinitis Symptom History: She reports that she is not symptomatic to cats. She just adopted a little kitten Jonne Ply, who is pure white and has an adorable pink rose). A neighbor found her. She now has two cats. She had one that needed to be put be put to sleep due to a urethra problem.  She is taking carbinoxamine, montelukast, and her nose spray. She completed her Augmentin and prednisone from the last visit but she has done well otherwise. She did have sinus issues at the last visit, but these improved before starting antibiotics.   Otherwise, there have been no changes to her past medical history, surgical history, family history, or social history.    Review of Systems  Constitutional: Negative.  Negative for chills, fever, malaise/fatigue and weight loss.  HENT: Negative.  Negative for congestion, ear discharge, ear pain and sinus pain.   Eyes:  Negative for pain, discharge and redness.  Respiratory:  Negative for cough, sputum production, shortness of breath, wheezing and stridor.   Cardiovascular: Negative.  Negative for chest pain and palpitations.  Gastrointestinal:  Negative for abdominal pain, constipation, diarrhea, heartburn, nausea and vomiting.  Skin: Negative.  Negative for itching and rash.  Neurological:  Negative for dizziness and headaches.  Endo/Heme/Allergies:  Negative for environmental allergies. Does not bruise/bleed easily.       Objective:   Blood pressure 118/70, pulse 83, temperature 98.3 F (36.8 C), temperature source Temporal, resp. rate 16, last menstrual  period 03/05/2012, SpO2 98 %. There is no height or weight on file to calculate BMI.    Physical Exam Vitals reviewed.  Constitutional:      Appearance: She is well-developed.     Comments: Very pleasant.  Talkative.  HENT:     Head: Normocephalic and atraumatic.     Right Ear: Tympanic membrane, ear canal and external ear normal.     Left Ear: Tympanic membrane, ear canal and external ear normal.     Nose: No nasal deformity, septal deviation, mucosal edema or rhinorrhea.     Right Turbinates: Enlarged, swollen and pale.     Left Turbinates: Enlarged, swollen and pale.     Right Sinus: No maxillary sinus tenderness or frontal sinus tenderness.     Left Sinus: No maxillary sinus tenderness or frontal sinus tenderness.     Mouth/Throat:     Lips: Pink.     Mouth: Mucous membranes are moist. Mucous membranes are not pale and not dry.     Pharynx: Uvula midline.  Eyes:     General: Lids are normal. No allergic shiner.       Right eye: No discharge.        Left eye: No discharge.     Conjunctiva/sclera: Conjunctivae normal.  Right eye: Right conjunctiva is not injected. No chemosis.    Left eye: Left conjunctiva is not injected. No chemosis.    Pupils: Pupils are equal, round, and reactive to light.  Cardiovascular:     Rate and Rhythm: Normal rate and regular rhythm.     Heart sounds: Normal heart sounds.  Pulmonary:     Effort: Pulmonary effort is normal. No tachypnea, accessory muscle usage or respiratory distress.     Breath sounds: Normal breath sounds. No wheezing, rhonchi or rales.     Comments: Moving air well in all lung fields.  No increased work of breathing. No crackles noted.  Chest:     Chest wall: No tenderness.  Lymphadenopathy:     Cervical: No cervical adenopathy.  Skin:    General: Skin is warm.     Capillary Refill: Capillary refill takes less than 2 seconds.     Coloration: Skin is not pale.     Findings: No abrasion, erythema, petechiae or rash. Rash  is not papular, urticarial or vesicular.     Comments: She does have the resolving lesion on her left lower extremity.  There is no erythema or discharge.  Neurological:     Mental Status: She is alert.  Psychiatric:        Behavior: Behavior is cooperative.      Diagnostic studies:   Spirometry: results normal (FEV1: 1.89/105%, FVC: 2.21/94%, FEV1/FVC: 86%).    Spirometry consistent with normal pattern.   Allergy Studies: none        Salvatore Marvel, MD  [facility]

## 2021-12-10 NOTE — Telephone Encounter (Signed)
PA has been APPROVED from 12/10/2021-02/07/2022

## 2021-12-11 ENCOUNTER — Encounter: Payer: Self-pay | Admitting: Allergy & Immunology

## 2021-12-16 ENCOUNTER — Other Ambulatory Visit: Payer: Self-pay | Admitting: Internal Medicine

## 2021-12-16 DIAGNOSIS — E039 Hypothyroidism, unspecified: Secondary | ICD-10-CM

## 2021-12-17 ENCOUNTER — Telehealth: Payer: Self-pay

## 2021-12-17 NOTE — Telephone Encounter (Signed)
Please call patient.  She would like you to explain more about what you are going to do in surgery next Thursday.  [phone]

## 2021-12-24 ENCOUNTER — Telehealth: Payer: Self-pay | Admitting: Orthopaedic Surgery

## 2021-12-24 ENCOUNTER — Telehealth: Payer: Self-pay

## 2021-12-24 ENCOUNTER — Other Ambulatory Visit: Payer: Self-pay | Admitting: Orthopaedic Surgery

## 2021-12-24 DIAGNOSIS — M75111 Incomplete rotator cuff tear or rupture of right shoulder, not specified as traumatic: Secondary | ICD-10-CM | POA: Diagnosis not present

## 2021-12-24 DIAGNOSIS — M2341 Loose body in knee, right knee: Secondary | ICD-10-CM | POA: Diagnosis not present

## 2021-12-24 DIAGNOSIS — M7551 Bursitis of right shoulder: Secondary | ICD-10-CM | POA: Diagnosis not present

## 2021-12-24 DIAGNOSIS — M19011 Primary osteoarthritis, right shoulder: Secondary | ICD-10-CM | POA: Diagnosis not present

## 2021-12-24 DIAGNOSIS — M7541 Impingement syndrome of right shoulder: Secondary | ICD-10-CM | POA: Diagnosis not present

## 2021-12-24 DIAGNOSIS — M67813 Other specified disorders of tendon, right shoulder: Secondary | ICD-10-CM | POA: Diagnosis not present

## 2021-12-24 MED ORDER — HYDROCODONE-ACETAMINOPHEN 5-325 MG PO TABS: 1.0000 | ORAL_TABLET | Freq: Four times a day (QID) | ORAL | 0 refills | Status: DC | PRN

## 2021-12-24 MED ORDER — OXYCODONE HCL 5 MG PO TABS: 5.0000 mg | ORAL_TABLET | ORAL | 0 refills | Status: DC | PRN

## 2021-12-24 NOTE — Telephone Encounter (Signed)
Pt called asking for [place] to call pre auth for pain medication. Pt has surgery today and went to pick up meds and pharmacy need Dr Ninfa Linden to call with pre auth. Please call pt after pre Josem Kaufmann has been sent. Pt phone number is [phone]

## 2021-12-24 NOTE — Telephone Encounter (Signed)
Patient aware of the below message and that we will try Hydrocodone and see if this will go through

## 2021-12-24 NOTE — Telephone Encounter (Signed)
Pharmacy called stating we may need to change the oxycodone because she also take a "mood stabilizer" Vraylar '3mg'$ ;  which is throwing  a huge red flag reaction with combining the two

## 2021-12-30 ENCOUNTER — Ambulatory Visit (INDEPENDENT_AMBULATORY_CARE_PROVIDER_SITE_OTHER): Payer: Medicare HMO | Admitting: Orthopaedic Surgery

## 2021-12-30 ENCOUNTER — Other Ambulatory Visit: Payer: Self-pay | Admitting: Internal Medicine

## 2021-12-30 ENCOUNTER — Other Ambulatory Visit: Payer: Self-pay

## 2021-12-30 ENCOUNTER — Encounter: Payer: Self-pay | Admitting: Orthopaedic Surgery

## 2021-12-30 DIAGNOSIS — M7541 Impingement syndrome of right shoulder: Secondary | ICD-10-CM

## 2021-12-30 DIAGNOSIS — Z9889 Other specified postprocedural states: Secondary | ICD-10-CM

## 2021-12-30 DIAGNOSIS — I493 Ventricular premature depolarization: Secondary | ICD-10-CM

## 2021-12-30 DIAGNOSIS — I10 Essential (primary) hypertension: Secondary | ICD-10-CM

## 2021-12-30 DIAGNOSIS — M24011 Loose body in right shoulder: Secondary | ICD-10-CM

## 2021-12-30 DIAGNOSIS — M25511 Pain in right shoulder: Secondary | ICD-10-CM

## 2021-12-30 MED ORDER — TRAMADOL HCL 50 MG PO TABS: 50.0000 mg | ORAL_TABLET | Freq: Three times a day (TID) | ORAL | 0 refills | Status: DC | PRN

## 2021-12-30 NOTE — Progress Notes (Signed)
The patient is here for her first postoperative visit status post a right shoulder arthroscopy.  We found partial-thickness rotator cuff tearing and moderate arthritis of the glenohumeral joint.  There was also a large loose body surprising in the subacromial outlet that we removed.  She has been having just only some mild pain.  I did go over the arthroscopy pictures with her.  Her incisions look good and the sutures been removed.  Her shoulder is well located with just some slight weakness and pain.  I would like to set her up for outpatient physical therapy for any modalities that can help get her shoulder moving better and stronger as well as decrease her shoulder pain.  I will send in some tramadol for mild pain.  Will see her back in 4 weeks to see how she is doing overall.  All questions and concerns were addressed and answered.

## 2021-12-30 NOTE — Telephone Encounter (Signed)
Patient called to check up on this also.

## 2022-01-11 NOTE — Therapy (Signed)
OUTPATIENT PHYSICAL THERAPY SHOULDER EVALUATION   Patient Name: [name] MRN: [id number] DOB:1955/09/28, 66 y.o., female Today's Date: 01/12/2022  END OF SESSION:  PT End of Session - 01/12/22 1344     Visit Number 1    Number of Visits 20    Date for PT Re-Evaluation 03/26/22    Progress Note Due on Visit 10    PT Start Time 1300    PT Stop Time 1345    PT Time Calculation (min) 45 min    Activity Tolerance Patient tolerated treatment well    Behavior During Therapy WFL for tasks assessed/performed             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   Hypothyroidism    Intermittent palpitations    cardiology--  dr [place]   Interstitial cystitis    Limited jaw range of motion    s/p  bilateral TMJ surgery,  age 32s   Migraines    Moderate persistent asthma    pulmologist-- dr Halford Chessman   OA (osteoarthritis)    both knees   OSA on CPAP    per last sleep study 09/ 2017  mild OSA, AHI13/hr   PONV (postoperative nausea and vomiting)    PVC (premature ventricular contraction)    Rosacea    Sensation of pressure in bladder area    per pt intermittant   TMJ arthralgia    Urticaria    Wears glasses    White coat syndrome without hypertension    05-25-2017  per pt hx hypertension yrs ago, no issues after quitting stressful job   Past Surgical History:  Procedure Laterality Date   67 HOUR [place] STUDY N/A 09/22/2015   Procedure: 24 HOUR [place] STUDY;  Surgeon:  Doran Stabler, MD;  Location: [facility];  Service: Gastroenterology;  Laterality: N/A;   ADENOIDECTOMY     ANAL FISSURE REPAIR  age 47  (72)   BREAST EXCISIONAL BIOPSY Right 04-16-2003  dr p. young  [facility]   benign   BUNIONECTOMY Right early 2000s   CARPAL TUNNEL RELEASE Left age 52 (59)   CHOLECYSTECTOMY OPEN  age 41   CYSTO WITH HYDRODISTENSION N/A 06/02/2017   Procedure: CYSTOSCOPY/HYDRODISTENSION OF BLADDER;  Surgeon: Irine Seal, MD;  Location: [facility];  Service: Urology;  Laterality: N/A;   CYSTO/ HYDRODISTENTION/  INSTILLATION THERAPY  1990s   DX LAPAROSCOPY/  DX HYSTEROSCOPY/  D & C  08-07-2007   dr dove  [place]  10-21-2008   dr Harolyn Rutherford  [facility]   ESOPHAGEAL MANOMETRY N/A 09/22/2015   Procedure: ESOPHAGEAL MANOMETRY (EM);  Surgeon: Doran Stabler, MD;  Location: [facility];  Service: Gastroenterology;  Laterality: N/A;  with impedence    FINGER ARTHROPLASTY Bilateral    left little  finger 2016:  right middle finger 06/ 2018   KNEE ARTHROSCOPY Bilateral x3 left ;  x3  right-- last one age 57 (10)   NASAL SEPTUM SURGERY  age 89s   ROBOT ASSISTED REDUCTION PARAESOPHAGEAL HIATAL HERNIA/ TYPE 2 MEDIASTINAL DISSECTION/ PRIMARY HIATAL HERNIA REPAIR/ ANTERIOR & POSTERIOR GASTROPEXY/ NISSEN FUNDOPLICATION  [id number]   DR GROSS  [facility]   SINOSCOPY     TEMPOROMANDIBULAR JOINT SURGERY Bilateral x3  -- age 39s   per pt used graft   TONSILLECTOMY     TOTAL KNEE ARTHROPLASTY Left 12/26/2017   Procedure: LEFT TOTAL KNEE ARTHROPLASTY;  Surgeon: Gaynelle Arabian, MD;  Location: [facility];  Service: Orthopedics;  Laterality: Left;  65mn   TRANSTHORACIC ECHOCARDIOGRAM  12-06-2017   dr [place]ique  ef 55-60%, grade 1 diastoilc dysfunction, trivial MR   TRIGGER FINGER RELEASE Bilateral last one 2017   several release's bilaterally   ULNAR NERVE TRANSPOSITION Bilateral age 89(1980)   Patient Active Problem List   Diagnosis Date Noted    Hyperlipidemia LDL goal <100 08/24/2021   Encounter for general adult medical examination with abnormal findings 03/14/2021   Estrogen deficiency 03/09/2021   Cervical cancer screening 03/09/2021   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   Grade I diastolic dysfunction 029/51/8841  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[place] 07/27/2019   Osteoarthritis of glenohumeral joint 03/26/2019   Iron deficiency anemia 04/28/2018   OA (osteoarthritis) of knee 12/26/2017   PVC (premature ventricular contraction) 11/21/2017   Dysphonia 09/26/2017   Vitamin D deficiency, unspecified 10/13/2016   Primary osteoarthritis of left hand 06/30/2016   Osteoporosis without current pathological fracture 03/22/2016   Paraesophageal hiatal hernia s/p robotic repair & fundoplication 16/60/6301060/11/9321  Bipolar 1 disorder, mixed, moderate ([place] 01/29/2016   Class 1 obesity with body mass index (BMI) of 32.0 to 32.9 in adult 01/06/2016   Migraines 11/21/2014   OSA on CPAP 01/30/2007   GERD 11/02/2006   DERMATITIS, HANDS 11/02/2006   Hypothyroidism 10/07/2006   Bipolar affective ([place] 10/07/2006   Essential hypertension 10/07/2006   RHINITIS, CHRONIC 10/07/2006   Seasonal and perennial allergic rhinitis 10/07/2006   Moderate persistent asthma, uncomplicated 055/73/2202  Irritable bowel syndrome 10/07/2006   INTERSTITIAL CYSTITIS 10/07/2006   ROSACEA 10/07/2006   Osteoarthritis of both knees 10/07/2006   Disturbance in sleep behavior 10/07/2006    PCP:  JJanith Lima MD   REFERRING PROVIDER: BMcarthur Rossetti MD  REFERRING DIAG: M75.41 (ICD-10-CM) - Impingement syndrome of right shoulder  THERAPY DIAG:  Acute pain of right shoulder  Muscle weakness (generalized)  Stiffness of right shoulder, not elsewhere classified  Localized edema  Rationale for Evaluation and Treatment: Rehabilitation  ONSET DATE: 12/24/21  SUBJECTIVE:  SUBJECTIVE STATEMENT: Pt arriving to therapy s/p  Rt shoulder arthroscopy on 12/24/21. Pt arriving reporting 3/10 at rest. Pt stating at it's worse her pain can get to 6-6/44 with certain movements. Pt reporting she is on a 5# lifting restriction.   PERTINENT HISTORY:  Rt shoulder arthroscopy 12/24/21 DM2, anxiety, bipolar, depression, eczema, h/o melanoma 2000's, OA, migraines, white coat symdrome without HTN, TKA-left 2019, knee arthroscopy bil 1999,   PAIN:  NPRS scale: 3/10 Pain location: Rt shoulder Pain description: achy,  Aggravating factors: resting Relieving factors: ice, pt stating she has pain meds but hasn't been using them  PRECAUTIONS: no lifting over 5 pounds, shoulder arthroscopy on 12/24/21  WEIGHT BEARING RESTRICTIONS: No  FALLS:  Has patient fallen in last 6 months? Yes, pt stated she was pushing a recycle can down the driveway and she fell down when the can got out of control in March of 2023.   LIVING ENVIRONMENT: Lives with: lives with their family and lives with their spouse Lives in: House/apartment Stairs: Yes: External: 3 steps; none Has following equipment at home: None  OCCUPATION: On disability, pt volunteers at church  PLOF: [place] hurting, reach the back of my head, donning clothes without pain or difficutly    OBJECTIVE:   DIAGNOSTIC FINDINGS:  11/12/21 IMPRESSION: 1. Partial-thickness bursal sided tear of the supraspinatus tendon measuring up to 12 mm in AP dimension and involving up to 50% of the transverse tendon dimension. 2. Mild anterior supraspinatus muscle atrophy. 3. Mild degenerative changes of the  acromioclavicular joint. 4. Mild-to-moderate subacromial/subdeltoid bursitis. 11 mm loose body within the anterior aspect of the subacromial/subdeltoid bursa, just inferior to the lateral clavicle. 5. Moderate glenohumeral osteoarthritis.  PATIENT SURVEYS:  01/12/22: FOTO intake:   48%  COGNITION: Overall cognitive status: WFL     SENSATION: 01/12/22: WFL  POSTURE: 01/12/22: rounded shoulders, forward head   UPPER EXTREMITY ROM:   ROM Right 01/12/22 Left 01/12/22  Shoulder flexion 138 155  Shoulder extension 30 40  Shoulder abduction 120 160  Shoulder adduction    Shoulder internal rotation 55 62  Shoulder external rotation 60 70  Elbow flexion 150 152  Elbow extension 0 0  (Blank rows = not tested)  UPPER EXTREMITY MMT:  MMT Right 01/12/22 Left 01/12/22  Shoulder flexion 3/5 4/5  Shoulder extension 3/5 4/5  Shoulder abduction    Shoulder adduction    Shoulder internal rotation 3/5 4/5  Shoulder external rotation 3/5 4/5  Middle trapezius    Lower trapezius    Elbow flexion    Elbow extension    Grip strength (lbs)    (Blank rows = not tested)   PALPATION:  TTP: Right anterior, lateral and posterior.    TODAY'S TREATMENT:                                                                                                                           DATE: 01/12/22:  Therex:  HEP instruction/performance c cues for techniques, handout provided.  Trial set performed of each for comprehension and symptom assessment.  See below for exercise list   PATIENT EDUCATION: Education details: HEP, POC Person educated: Patient Education method: Explanation, Demonstration, Verbal cues, and Handouts Education comprehension: verbalized understanding, returned demonstration, and verbal cues required  HOME EXERCISE PROGRAM: Access Code: [email or web address] URL: [email or web address] Date: 01/12/2022 Prepared by: Kearney Hard  Exercises - Supine Shoulder Flexion  Extension AAROM with Dowel  - 2-3 x daily - 7 x weekly - 10 reps - Supine Shoulder External Rotation in 45 Degrees Abduction AAROM with Dowel  - 2-3 x daily - 7 x weekly - 10 reps - Standing Row with Anchored Resistance  - 2-3 x daily - 7 x weekly - 2 sets - 10 reps - Seated Shoulder Flexion Towel Slide at Table Top Full Range of Motion  - 2-3 x daily - 7 x weekly - 10 reps - 5 seconds hold - Seated Shoulder External Rotation PROM on Table  - 2-3 x daily - 7 x weekly - 10 reps - 5 seconds hold  ASSESSMENT:  CLINICAL IMPRESSION: Patient is a 66 y.o. who comes to clinic with complaints of Rt shoulder pain s/p Rt shoulder arthroscopy. Pt presents with pain with mobility, strength and movement coordination deficits that impair their ability to perform usual daily and recreational functional activities without increase difficulty/symptoms at this time.  Patient to benefit from skilled PT services to address impairments and limitations to improve to previous level of function without restriction secondary to condition.   OBJECTIVE IMPAIRMENTS: decreased mobility, decreased ROM, decreased strength, increased edema, impaired UE functional use, and pain.   ACTIVITY LIMITATIONS: carrying, lifting, sleeping, bathing, dressing, and reach over head  PARTICIPATION LIMITATIONS: community activity  PERSONAL FACTORS:  see pertinent history above  are also affecting patient's functional outcome.   REHAB POTENTIAL: Good  CLINICAL DECISION MAKING: Stable/uncomplicated  EVALUATION COMPLEXITY: Moderate   GOALS: Goals reviewed with patient? Yes  SHORT TERM GOALS: (target date for Short term goals are 3 weeks 02/05/22)   1.Patient will demonstrate independent use of home exercise program to maintain progress from in clinic treatments. Goal status: New  LONG TERM GOALS: (target dates for all long term goals are 10 weeks  03/26/2022)    1. Patient will demonstrate/report pain at worst less than or equal to  2/10 to facilitate minimal limitation in daily activity secondary to pain symptoms. Goal status: New   2. Patient will demonstrate independent use of home exercise program to facilitate ability to maintain/progress functional gains from skilled physical therapy services. Goal status: New   3. Patient will demonstrate FOTO outcome > or = 61 % to indicate reduced disability due to condition. Goal status: New   4.  Patient will demonstrate right  UE MMT 5/5 throughout to facilitate lifting, reaching, carrying at [facility] in daily activity.   Goal status: New   5.  Patient will demonstrate right [place] joint AROM WFL s symptoms to facilitate usual overhead reaching, self care, dressing at PLOF.    Goal status: New   6.  Pt will improve her Rt shoulder ER to >/= 70 degrees for ADL's.   Goal status: New  7. Pt will be able to lift 5 # from floor to counter height using her Rt UE.    Goal Status: New  PLAN:  PT FREQUENCY: 1-2x/week  PT DURATION: 10 weeks  PLANNED INTERVENTIONS: Therapeutic exercises, Therapeutic activity, Neuro Muscular re-education, Balance  training, Gait training, Patient/Family education, Joint mobilization, Stair training, DME instructions, Dry Needling, Electrical stimulation, Traction, Cryotherapy, vasopneumatic device Moist heat, Taping, Ultrasound, Ionotophoresis '4mg'$ /ml Dexamethasone, and Manual therapy.  All included unless contraindicated  PLAN FOR NEXT SESSION: Review HEP knowledge/results, shoulder ROM, manual therapy as needed     Oretha Caprice, PT, MPT 01/12/2022, 4:16 PM

## 2022-01-12 ENCOUNTER — Ambulatory Visit: Payer: Medicare HMO | Admitting: Physical Therapy

## 2022-01-12 ENCOUNTER — Encounter: Payer: Self-pay | Admitting: Physical Therapy

## 2022-01-12 DIAGNOSIS — M6281 Muscle weakness (generalized): Secondary | ICD-10-CM | POA: Diagnosis not present

## 2022-01-12 DIAGNOSIS — M25511 Pain in right shoulder: Secondary | ICD-10-CM

## 2022-01-12 DIAGNOSIS — R600 Localized edema: Secondary | ICD-10-CM | POA: Diagnosis not present

## 2022-01-12 DIAGNOSIS — M25611 Stiffness of right shoulder, not elsewhere classified: Secondary | ICD-10-CM | POA: Diagnosis not present

## 2022-01-13 DIAGNOSIS — L814 Other melanin hyperpigmentation: Secondary | ICD-10-CM | POA: Diagnosis not present

## 2022-01-13 DIAGNOSIS — L821 Other seborrheic keratosis: Secondary | ICD-10-CM | POA: Diagnosis not present

## 2022-01-13 DIAGNOSIS — Z86018 Personal history of other benign neoplasm: Secondary | ICD-10-CM | POA: Diagnosis not present

## 2022-01-13 DIAGNOSIS — D229 Melanocytic nevi, unspecified: Secondary | ICD-10-CM | POA: Diagnosis not present

## 2022-01-13 DIAGNOSIS — L853 Xerosis cutis: Secondary | ICD-10-CM | POA: Diagnosis not present

## 2022-01-13 DIAGNOSIS — L578 Other skin changes due to chronic exposure to nonionizing radiation: Secondary | ICD-10-CM | POA: Diagnosis not present

## 2022-01-13 DIAGNOSIS — Z85828 Personal history of other malignant neoplasm of skin: Secondary | ICD-10-CM | POA: Diagnosis not present

## 2022-01-14 DIAGNOSIS — F411 Generalized anxiety disorder: Secondary | ICD-10-CM | POA: Diagnosis not present

## 2022-01-14 DIAGNOSIS — F3132 Bipolar disorder, current episode depressed, moderate: Secondary | ICD-10-CM | POA: Diagnosis not present

## 2022-01-14 DIAGNOSIS — R69 Illness, unspecified: Secondary | ICD-10-CM | POA: Diagnosis not present

## 2022-01-19 ENCOUNTER — Ambulatory Visit: Payer: Medicare HMO | Admitting: Physical Therapy

## 2022-01-19 ENCOUNTER — Encounter: Payer: Self-pay | Admitting: Physical Therapy

## 2022-01-19 DIAGNOSIS — M6281 Muscle weakness (generalized): Secondary | ICD-10-CM | POA: Diagnosis not present

## 2022-01-19 DIAGNOSIS — M25611 Stiffness of right shoulder, not elsewhere classified: Secondary | ICD-10-CM | POA: Diagnosis not present

## 2022-01-19 DIAGNOSIS — M25511 Pain in right shoulder: Secondary | ICD-10-CM

## 2022-01-19 DIAGNOSIS — R600 Localized edema: Secondary | ICD-10-CM | POA: Diagnosis not present

## 2022-01-19 NOTE — Therapy (Signed)
OUTPATIENT PHYSICAL THERAPY SHOULDER TREATMENT   Patient Name: [name] MRN: [id number] DOB:1955-04-23, 66 y.o., female Today's Date: 01/19/2022  END OF SESSION:  PT End of Session - 01/19/22 1025     Visit Number 2    Number of Visits 20    Date for PT Re-Evaluation 03/26/22    Progress Note Due on Visit 10    PT Start Time 0930    PT Stop Time 1010    PT Time Calculation (min) 40 min    Activity Tolerance Patient tolerated treatment well    Behavior During Therapy WFL for tasks assessed/performed              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   Hypothyroidism    Intermittent palpitations    cardiology--  dr [place]   Interstitial cystitis    Limited jaw range of motion    s/p  bilateral TMJ surgery,  age 81s   Migraines    Moderate persistent asthma    pulmologist-- dr Halford Chessman   OA (osteoarthritis)    both knees   OSA on CPAP    per last sleep study 09/ 2017  mild OSA, AHI13/hr   PONV (postoperative nausea and vomiting)    PVC (premature ventricular contraction)    Rosacea    Sensation of pressure in bladder area    per pt intermittant   TMJ arthralgia    Urticaria    Wears glasses    White coat syndrome without hypertension    05-25-2017  per pt hx hypertension yrs ago, no issues after quitting stressful job   Past Surgical History:  Procedure Laterality Date   75 HOUR [place] STUDY N/A 09/22/2015   Procedure: 24 HOUR [place] STUDY;  Surgeon:  Doran Stabler, MD;  Location: [facility];  Service: Gastroenterology;  Laterality: N/A;   ADENOIDECTOMY     ANAL FISSURE REPAIR  age 71  (69)   BREAST EXCISIONAL BIOPSY Right 04-16-2003  dr p. young  [facility]   benign   BUNIONECTOMY Right early 2000s   CARPAL TUNNEL RELEASE Left age 38 (62)   CHOLECYSTECTOMY OPEN  age 35   CYSTO WITH HYDRODISTENSION N/A 06/02/2017   Procedure: CYSTOSCOPY/HYDRODISTENSION OF BLADDER;  Surgeon: Irine Seal, MD;  Location: [facility];  Service: Urology;  Laterality: N/A;   CYSTO/ HYDRODISTENTION/  INSTILLATION THERAPY  1990s   DX LAPAROSCOPY/  DX HYSTEROSCOPY/  D & C  08-07-2007   dr dove  [place]  10-21-2008   dr Harolyn Rutherford  [facility]   ESOPHAGEAL MANOMETRY N/A 09/22/2015   Procedure: ESOPHAGEAL MANOMETRY (EM);  Surgeon: Doran Stabler, MD;  Location: [facility];  Service: Gastroenterology;  Laterality: N/A;  with impedence    FINGER ARTHROPLASTY Bilateral    left  little finger 2016:  right middle finger 06/ 2018   KNEE ARTHROSCOPY Bilateral x3 left ;  x3  right-- last one age 80 (74)   NASAL SEPTUM SURGERY  age 37s   ROBOT ASSISTED REDUCTION PARAESOPHAGEAL HIATAL HERNIA/ TYPE 2 MEDIASTINAL DISSECTION/ PRIMARY HIATAL HERNIA REPAIR/ ANTERIOR & POSTERIOR GASTROPEXY/ NISSEN FUNDOPLICATION  [phone]   DR GROSS  [facility]   SINOSCOPY     TEMPOROMANDIBULAR JOINT SURGERY Bilateral x3  -- age 73s   per pt used graft   TONSILLECTOMY     TOTAL KNEE ARTHROPLASTY Left 12/26/2017   Procedure: LEFT TOTAL KNEE ARTHROPLASTY;  Surgeon: Gaynelle Arabian, MD;  Location: [facility];  Service: Orthopedics;  Laterality: Left;  40mn   TRANSTHORACIC ECHOCARDIOGRAM  12-06-2017   dr jMartinique  ef 55-60%, grade 1 diastoilc dysfunction, trivial MR   TRIGGER FINGER RELEASE Bilateral last one 2017   several release's bilaterally   ULNAR NERVE TRANSPOSITION Bilateral age 66(1980)   Patient Active Problem List   Diagnosis Date Noted    Hyperlipidemia LDL goal <100 08/24/2021   Encounter for general adult medical examination with abnormal findings 03/14/2021   Estrogen deficiency 03/09/2021   Cervical cancer screening 03/09/2021   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   Grade I diastolic dysfunction [id number]/[id number]  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[place] 07/27/2019   Osteoarthritis of glenohumeral joint 03/26/2019   Iron deficiency anemia 04/28/2018   OA (osteoarthritis) of knee 12/26/2017   PVC (premature ventricular contraction) 11/21/2017   Dysphonia 09/26/2017   Vitamin D deficiency, unspecified 10/13/2016   Primary osteoarthritis of left hand 06/30/2016   Osteoporosis without current pathological fracture 03/22/2016   Paraesophageal hiatal hernia s/p robotic repair & fundoplication [id number]/0355  Bipolar 1 disorder, mixed, moderate ([place] 01/29/2016   Class 1 obesity with body mass index (BMI) of 32.0 to 32.9 in adult 01/06/2016   Migraines 11/21/2014   OSA on CPAP 01/30/2007   GERD 11/02/2006   DERMATITIS, HANDS 11/02/2006   Hypothyroidism 10/07/2006   Bipolar affective ([place] 10/07/2006   Essential hypertension 10/07/2006   RHINITIS, CHRONIC 10/07/2006   Seasonal and perennial allergic rhinitis 10/07/2006   Moderate persistent asthma, uncomplicated 097/41/6384  Irritable bowel syndrome 10/07/2006   INTERSTITIAL CYSTITIS 10/07/2006   ROSACEA 10/07/2006   Osteoarthritis of both knees 10/07/2006   Disturbance in sleep behavior 10/07/2006    PCP:  JJanith Lima MD   REFERRING PROVIDER: BMcarthur Rossetti MD  REFERRING DIAG: M75.41 (ICD-10-CM) - Impingement syndrome of right shoulder  THERAPY DIAG:  Acute pain of right shoulder  Stiffness of right shoulder, not elsewhere classified  Localized edema  Muscle weakness (generalized)  Rationale for Evaluation and Treatment: Rehabilitation  ONSET DATE: 12/24/21  SUBJECTIVE:  SUBJECTIVE STATEMENT: Pt states that's she is having a 2/10 pain this morning. She states that she is not currently taking any medications for pain.   PERTINENT HISTORY:  Rt shoulder arthroscopy 12/24/21 DM2, anxiety, bipolar, depression, eczema, h/o melanoma 2000's, OA, migraines, white coat symdrome without HTN, TKA-left 2019, knee arthroscopy bil 1999,   PAIN:  NPRS scale: 3/10 Pain location: Rt shoulder Pain description: achy,  Aggravating factors: resting Relieving factors: ice, pt stating she has pain meds but hasn't been using them  PRECAUTIONS: no lifting over 5 pounds, shoulder arthroscopy on 12/24/21  WEIGHT BEARING RESTRICTIONS: No  FALLS:  Has patient fallen in last 6 months? Yes, pt stated she was pushing a recycle can down the driveway and she fell down when the can got out of control in March of 2023.   LIVING ENVIRONMENT: Lives with: lives with their family and lives with their spouse Lives in: House/apartment Stairs: Yes: External: 3 steps; none Has following equipment at home: None  OCCUPATION: On disability, pt volunteers at church  PLOF: [place] hurting, reach the back of my head, donning clothes without pain or difficutly    OBJECTIVE:   DIAGNOSTIC FINDINGS:  11/12/21 IMPRESSION: 1. Partial-thickness bursal sided tear of the supraspinatus tendon measuring up to 12 mm in AP dimension and involving up to 50% of the transverse tendon dimension. 2. Mild anterior supraspinatus muscle atrophy. 3. Mild degenerative changes of the acromioclavicular joint. 4. Mild-to-moderate subacromial/subdeltoid bursitis. 11 mm loose body within the  anterior aspect of the subacromial/subdeltoid bursa, just inferior to the lateral clavicle. 5. Moderate glenohumeral osteoarthritis.  PATIENT SURVEYS:  01/12/22: FOTO intake:   48%  COGNITION: Overall cognitive status: WFL     SENSATION: 01/12/22: WFL  POSTURE: 01/12/22: rounded shoulders, forward head   UPPER EXTREMITY ROM:   ROM Right 01/12/22 Left 01/12/22  Shoulder flexion 138 155  Shoulder extension 30 40  Shoulder abduction 120 160  Shoulder adduction    Shoulder internal rotation 55 62  Shoulder external rotation 60 70  Elbow flexion 150 152  Elbow extension 0 0  (Blank rows = not tested)  UPPER EXTREMITY MMT:  MMT Right 01/12/22 Left 01/12/22  Shoulder flexion 3/5 4/5  Shoulder extension 3/5 4/5  Shoulder abduction    Shoulder adduction    Shoulder internal rotation 3/5 4/5  Shoulder external rotation 3/5 4/5  Middle trapezius    Lower trapezius    Elbow flexion    Elbow extension    Grip strength (lbs)    (Blank rows = not tested)   PALPATION:  TTP: Right anterior, lateral and posterior.    TODAY'S TREATMENT:                                                                                                                           DATE: 01/19/2022:  Therex: - Supine [place] flexion with 1# dowel, x20 with 2-3 sec hold at end range - Supine [facility] ER  with 1# dowel, x20 with 2-3 sec hold at end range - Supine serratus punches with 1# dowel, x20 with 2-3 sec hold at end range - Lat pull downs with GTB 2x10  - Rows with GTB 2x10  - IR with GTB 2x10  - ER with GTB 2x10   01/12/22:  Therex:    HEP instruction/performance c cues for techniques, handout provided.  Trial set performed of each for comprehension and symptom assessment.  See below for exercise list   PATIENT EDUCATION: Education details: HEP, POC Person educated: Patient Education method: Explanation, Demonstration, Verbal cues, and Handouts Education comprehension: verbalized understanding,  returned demonstration, and verbal cues required  HOME EXERCISE PROGRAM: Access Code: [email or web address] URL: [email or web address] Date: 01/12/2022 Prepared by: Kearney Hard  Exercises - Supine Shoulder Flexion Extension AAROM with Dowel  - 2-3 x daily - 7 x weekly - 10 reps - Supine Shoulder External Rotation in 45 Degrees Abduction AAROM with Dowel  - 2-3 x daily - 7 x weekly - 10 reps - Standing Row with Anchored Resistance  - 2-3 x daily - 7 x weekly - 2 sets - 10 reps - Seated Shoulder Flexion Towel Slide at Table Top Full Range of Motion  - 2-3 x daily - 7 x weekly - 10 reps - 5 seconds hold - Seated Shoulder External Rotation PROM on Table  - 2-3 x daily - 7 x weekly - 10 reps - 5 seconds hold  ASSESSMENT:  CLINICAL IMPRESSION: Pt arrives to her first follow up appt with reports of mild pain at rest. She state states that she has been active with her HEP intermittently as she did not have a cane to perform the exercises with, but found an umbrella. Pt also struggled with placement of her theraband. Pt was provided alternative ways to anchor her band in the door. She tolerated session well with slight increase in pain to 3/10 with resisted ER today. Pt demonstrates good posture, but requires cues to perform slow eccentric movements. HEP updated. Pt will continue to benefit from skilled PT to address continued deficits.    OBJECTIVE IMPAIRMENTS: decreased mobility, decreased ROM, decreased strength, increased edema, impaired UE functional use, and pain.   ACTIVITY LIMITATIONS: carrying, lifting, sleeping, bathing, dressing, and reach over head  PARTICIPATION LIMITATIONS: community activity  PERSONAL FACTORS:  see pertinent history above  are also affecting patient's functional outcome.   REHAB POTENTIAL: Good  CLINICAL DECISION MAKING: Stable/uncomplicated  EVALUATION COMPLEXITY: Moderate   GOALS: Goals reviewed with patient? Yes  SHORT TERM GOALS: (target date for  Short term goals are 3 weeks 02/05/22)   1.Patient will demonstrate independent use of home exercise program to maintain progress from in clinic treatments. Goal status: New  LONG TERM GOALS: (target dates for all long term goals are 10 weeks  03/26/2022)    1. Patient will demonstrate/report pain at worst less than or equal to 2/10 to facilitate minimal limitation in daily activity secondary to pain symptoms. Goal status: New   2. Patient will demonstrate independent use of home exercise program to facilitate ability to maintain/progress functional gains from skilled physical therapy services. Goal status: New   3. Patient will demonstrate FOTO outcome > or = 61 % to indicate reduced disability due to condition. Goal status: New   4.  Patient will demonstrate right  UE MMT 5/5 throughout to facilitate lifting, reaching, carrying at [facility] in daily activity.   Goal status: New   5.  Patient will demonstrate right  [place] joint AROM WFL s symptoms to facilitate usual overhead reaching, self care, dressing at PLOF.    Goal status: New   6.  Pt will improve her Rt shoulder ER to >/= 70 degrees for ADL's.   Goal status: New  7. Pt will be able to lift 5 # from floor to counter height using her Rt UE.    Goal Status: New  PLAN:  PT FREQUENCY: 1-2x/week  PT DURATION: 10 weeks  PLANNED INTERVENTIONS: Therapeutic exercises, Therapeutic activity, Neuro Muscular re-education, Balance training, Gait training, Patient/Family education, Joint mobilization, Stair training, DME instructions, Dry Needling, Electrical stimulation, Traction, Cryotherapy, vasopneumatic device Moist heat, Taping, Ultrasound, Ionotophoresis '4mg'$ /ml Dexamethasone, and Manual therapy.  All included unless contraindicated  PLAN FOR NEXT SESSION: Review HEP knowledge/results, shoulder ROM, manual therapy as needed     Lynden Ang, PT, MPT 01/19/2022, 10:25 AM

## 2022-01-21 ENCOUNTER — Encounter: Payer: Medicare HMO | Admitting: Physical Therapy

## 2022-01-23 ENCOUNTER — Other Ambulatory Visit: Payer: Self-pay | Admitting: Internal Medicine

## 2022-01-23 DIAGNOSIS — E876 Hypokalemia: Secondary | ICD-10-CM

## 2022-01-25 ENCOUNTER — Encounter: Payer: Self-pay | Admitting: Physical Therapy

## 2022-01-25 ENCOUNTER — Ambulatory Visit: Payer: Medicare HMO | Admitting: Physical Therapy

## 2022-01-25 DIAGNOSIS — M6281 Muscle weakness (generalized): Secondary | ICD-10-CM

## 2022-01-25 DIAGNOSIS — R600 Localized edema: Secondary | ICD-10-CM | POA: Diagnosis not present

## 2022-01-25 DIAGNOSIS — M25511 Pain in right shoulder: Secondary | ICD-10-CM

## 2022-01-25 DIAGNOSIS — M25611 Stiffness of right shoulder, not elsewhere classified: Secondary | ICD-10-CM

## 2022-01-25 NOTE — Therapy (Signed)
OUTPATIENT PHYSICAL THERAPY SHOULDER TREATMENT   Patient Name: [name] MRN: [id number] DOB:1955-05-11, 66 y.o., female Today's Date: 01/25/2022  END OF SESSION:  PT End of Session - 01/25/22 1429     Visit Number 3    Number of Visits 20    Date for PT Re-Evaluation 03/26/22    Progress Note Due on Visit 10    PT Start Time 1430    PT Stop Time 1515    PT Time Calculation (min) 45 min    Activity Tolerance Patient tolerated treatment well    Behavior During Therapy WFL for tasks assessed/performed               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   Hypothyroidism    Intermittent palpitations    cardiology--  dr [place]   Interstitial cystitis    Limited jaw range of motion    s/p  bilateral TMJ surgery,  age 33s   Migraines    Moderate persistent asthma    pulmologist-- dr Halford Chessman   OA (osteoarthritis)    both knees   OSA on CPAP    per last sleep study 09/ 2017  mild OSA, AHI13/hr   PONV (postoperative nausea and vomiting)    PVC (premature ventricular contraction)    Rosacea    Sensation of pressure in bladder area    per pt intermittant   TMJ arthralgia    Urticaria    Wears glasses    White coat syndrome without hypertension    05-25-2017  per pt hx hypertension yrs ago, no issues after quitting stressful job   Past Surgical History:  Procedure Laterality Date   38 HOUR [place] STUDY N/A 09/22/2015   Procedure: 24 HOUR [place] STUDY;   Surgeon: Doran Stabler, MD;  Location: [facility];  Service: Gastroenterology;  Laterality: N/A;   ADENOIDECTOMY     ANAL FISSURE REPAIR  age 31  (51)   BREAST EXCISIONAL BIOPSY Right 04-16-2003  dr p. young  [facility]   benign   BUNIONECTOMY Right early 2000s   CARPAL TUNNEL RELEASE Left age 55 (30)   CHOLECYSTECTOMY OPEN  age 57   CYSTO WITH HYDRODISTENSION N/A 06/02/2017   Procedure: CYSTOSCOPY/HYDRODISTENSION OF BLADDER;  Surgeon: Irine Seal, MD;  Location: [facility];  Service: Urology;  Laterality: N/A;   CYSTO/ HYDRODISTENTION/  INSTILLATION THERAPY  1990s   DX LAPAROSCOPY/  DX HYSTEROSCOPY/  D & C  08-07-2007   dr dove  [place]  10-21-2008   dr Harolyn Rutherford  [facility]   ESOPHAGEAL MANOMETRY N/A 09/22/2015   Procedure: ESOPHAGEAL MANOMETRY (EM);  Surgeon: Doran Stabler, MD;  Location: [facility];  Service: Gastroenterology;  Laterality: N/A;  with impedence    FINGER ARTHROPLASTY Bilateral  left little finger 2016:  right middle finger 06/ 2018   KNEE ARTHROSCOPY Bilateral x3 left ;  x3  right-- last one age 36 (52)   NASAL SEPTUM SURGERY  age 51s   ROBOT ASSISTED REDUCTION PARAESOPHAGEAL HIATAL HERNIA/ TYPE 2 MEDIASTINAL DISSECTION/ PRIMARY HIATAL HERNIA REPAIR/ ANTERIOR & POSTERIOR GASTROPEXY/ NISSEN FUNDOPLICATION  [id number]   DR GROSS  [facility]   SINOSCOPY     TEMPOROMANDIBULAR JOINT SURGERY Bilateral x3  -- age 44s   per pt used graft   TONSILLECTOMY     TOTAL KNEE ARTHROPLASTY Left 12/26/2017   Procedure: LEFT TOTAL KNEE ARTHROPLASTY;  Surgeon: Gaynelle Arabian, MD;  Location: [facility];  Service: Orthopedics;  Laterality: Left;  34mn   TRANSTHORACIC ECHOCARDIOGRAM  12-06-2017   dr jMartinique  ef 55-60%, grade 1 diastoilc dysfunction, trivial MR   TRIGGER FINGER RELEASE Bilateral last one 2017   several release's bilaterally   ULNAR NERVE TRANSPOSITION Bilateral age 66(1980)   Patient Active Problem List   Diagnosis Date Noted    Hyperlipidemia LDL goal <100 08/24/2021   Encounter for general adult medical examination with abnormal findings 03/14/2021   Estrogen deficiency 03/09/2021   Cervical cancer screening 03/09/2021   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   Grade I diastolic dysfunction 060/11/9321  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[place] 07/27/2019   Osteoarthritis of glenohumeral joint 03/26/2019   Iron deficiency anemia 04/28/2018   OA (osteoarthritis) of knee 12/26/2017   PVC (premature ventricular contraction) 11/21/2017   Dysphonia 09/26/2017   Vitamin D deficiency, unspecified 10/13/2016   Primary osteoarthritis of left hand 06/30/2016   Osteoporosis without current pathological fracture 03/22/2016   Paraesophageal hiatal hernia s/p robotic repair & fundoplication 15/57/3220025/42/7062  Bipolar 1 disorder, mixed, moderate ([place] 01/29/2016   Class 1 obesity with body mass index (BMI) of 32.0 to 32.9 in adult 01/06/2016   Migraines 11/21/2014   OSA on CPAP 01/30/2007   GERD 11/02/2006   DERMATITIS, HANDS 11/02/2006   Hypothyroidism 10/07/2006   Bipolar affective ([place] 10/07/2006   Essential hypertension 10/07/2006   RHINITIS, CHRONIC 10/07/2006   Seasonal and perennial allergic rhinitis 10/07/2006   Moderate persistent asthma, uncomplicated 037/62/8315  Irritable bowel syndrome 10/07/2006   INTERSTITIAL CYSTITIS 10/07/2006   ROSACEA 10/07/2006   Osteoarthritis of both knees 10/07/2006   Disturbance in sleep behavior 10/07/2006    PCP:  JJanith Lima MD   REFERRING PROVIDER: BMcarthur Rossetti MD  REFERRING DIAG: M75.41 (ICD-10-CM) - Impingement syndrome of right shoulder  THERAPY DIAG:  Acute pain of right shoulder  Stiffness of right shoulder, not elsewhere classified  Localized edema  Muscle weakness (generalized)  Rationale for Evaluation and Treatment: Rehabilitation  ONSET DATE: 12/24/21  SUBJECTIVE:  SUBJECTIVE STATEMENT: Shoulder is doing well for the most part, still pain with extension and reaching overhead.    PERTINENT HISTORY:  Rt shoulder arthroscopy 12/24/21 DM2, anxiety, bipolar, depression, eczema, h/o melanoma 2000's, OA, migraines, white coat symdrome without HTN, TKA-left 2019, knee arthroscopy bil 1999,   PAIN:  NPRS scale: 2/10 Pain location: Rt shoulder Pain description: achy,  Aggravating factors: resting Relieving factors: ice, pt stating she has pain meds but hasn't been using them  PRECAUTIONS: no lifting over 5 pounds, shoulder arthroscopy on 12/24/21  WEIGHT BEARING RESTRICTIONS: No  FALLS:  Has patient fallen in last 6 months? Yes, pt stated she was pushing a recycle can down the driveway and she fell down when the can got out of control in March of 2023.   LIVING ENVIRONMENT: Lives with: lives with their family and lives with their spouse Lives in: House/apartment Stairs: Yes: External: 3 steps; none Has following equipment at home: None  OCCUPATION: On disability, pt volunteers at church  PLOF: Love hurting, reach the back of my head, donning clothes without pain or difficutly    OBJECTIVE:   PATIENT SURVEYS:  01/12/22: FOTO intake:   48%  POSTURE: 01/12/22: rounded shoulders, forward head   UPPER EXTREMITY ROM:   ROM Right 01/12/22 Left 01/12/22 Right 01/25/22  Shoulder flexion 138 155 140  (Siting)  Shoulder extension 30 40   Shoulder abduction 120 160 114 (Sitting)  Shoulder adduction     Shoulder internal rotation 55 62   Shoulder external rotation 60 70   Elbow flexion 150 152   Elbow extension 0 0   (Blank  rows = not tested)  UPPER EXTREMITY MMT:  MMT Right 01/12/22 Left 01/12/22  Shoulder flexion 3/5 4/5  Shoulder extension 3/5 4/5  Shoulder abduction    Shoulder adduction    Shoulder internal rotation 3/5 4/5  Shoulder external rotation 3/5 4/5  (Blank rows = not tested)   PALPATION:  TTP: Right anterior, lateral and posterior.    TODAY'S TREATMENT:                                                                                                                           DATE: 01/25/22 TherEx: Rows L3 band 2 x 10 Rt IR with L3 band 2 x 10 Rt ER with L3 band 2 x 10  Bil shoulder ext L3 band 2 x 10 AA shoulder flexion 2# bar in supine x 20 reps Supine Rt shoulder flexion 2# 2 x 10 Sidelying Rt abduction to 90 deg 2 x 10  01/19/2022:  Therex: - Supine OH flexion with 1# dowel, x20 with 2-3 sec hold at end range - Supine OH ER with 1# dowel, x20 with 2-3 sec hold at end range - Supine serratus punches with 1# dowel, x20 with 2-3 sec hold at end range - Lat pull downs with GTB 2x10  - Rows with GTB 2x10  - IR with GTB 2x10  -  ER with GTB 2x10   01/12/22:  Therex:    HEP instruction/performance c cues for techniques, handout provided.  Trial set performed of each for comprehension and symptom assessment.  See below for exercise list   PATIENT EDUCATION: Education details: HEP, POC Person educated: Patient Education method: Explanation, Demonstration, Verbal cues, and Handouts Education comprehension: verbalized understanding, returned demonstration, and verbal cues required  HOME EXERCISE PROGRAM: Access Code: [email or web address] URL: [email or web address] Date: 01/12/2022 Prepared by: Kearney Hard  Exercises - Supine Shoulder Flexion Extension AAROM with Dowel  - 2-3 x daily - 7 x weekly - 10 reps - Supine Shoulder External Rotation in 45 Degrees Abduction AAROM with Dowel  - 2-3 x daily - 7 x weekly - 10 reps - Standing Row with Anchored Resistance  - 2-3 x  daily - 7 x weekly - 2 sets - 10 reps - Seated Shoulder Flexion Towel Slide at Table Top Full Range of Motion  - 2-3 x daily - 7 x weekly - 10 reps - 5 seconds hold - Seated Shoulder External Rotation PROM on Table  - 2-3 x daily - 7 x weekly - 10 reps - 5 seconds hold  ASSESSMENT:  CLINICAL IMPRESSION: Pt tolerated session well today with progression of light strengthening, which she tolerated well.  AROM overall good for standing, and with minimal pain.  Will continue to benefit from PT to maximize function.  OBJECTIVE IMPAIRMENTS: decreased mobility, decreased ROM, decreased strength, increased edema, impaired UE functional use, and pain.   ACTIVITY LIMITATIONS: carrying, lifting, sleeping, bathing, dressing, and reach over head  PARTICIPATION LIMITATIONS: community activity  PERSONAL FACTORS:  see pertinent history above  are also affecting patient's functional outcome.   REHAB POTENTIAL: Good  CLINICAL DECISION MAKING: Stable/uncomplicated  EVALUATION COMPLEXITY: Moderate   GOALS: Goals reviewed with patient? Yes  SHORT TERM GOALS: (target date for Short term goals are 3 weeks 02/05/22)   1.Patient will demonstrate independent use of home exercise program to maintain progress from in clinic treatments. Goal status: New  LONG TERM GOALS: (target dates for all long term goals are 10 weeks  03/26/2022)    1. Patient will demonstrate/report pain at worst less than or equal to 2/10 to facilitate minimal limitation in daily activity secondary to pain symptoms. Goal status: New   2. Patient will demonstrate independent use of home exercise program to facilitate ability to maintain/progress functional gains from skilled physical therapy services. Goal status: New   3. Patient will demonstrate FOTO outcome > or = 61 % to indicate reduced disability due to condition. Goal status: New   4.  Patient will demonstrate right  UE MMT 5/5 throughout to facilitate lifting, reaching,  carrying at [facility] in daily activity.   Goal status: New   5.  Patient will demonstrate right [place] joint AROM WFL s symptoms to facilitate usual overhead reaching, self care, dressing at PLOF.    Goal status: New   6.  Pt will improve her Rt shoulder ER to >/= 70 degrees for ADL's.   Goal status: New  7. Pt will be able to lift 5 # from floor to counter height using her Rt UE.    Goal Status: New  PLAN:  PT FREQUENCY: 1-2x/week  PT DURATION: 10 weeks  PLANNED INTERVENTIONS: Therapeutic exercises, Therapeutic activity, Neuro Muscular re-education, Balance training, Gait training, Patient/Family education, Joint mobilization, Stair training, DME instructions, Dry Needling, Electrical stimulation, Traction, Cryotherapy, vasopneumatic device Moist heat, Taping, Ultrasound, Ionotophoresis '4mg'$ /ml Dexamethasone, and Manual  therapy.  All included unless contraindicated  PLAN FOR NEXT SESSION: Shoulder ROM, manual therapy as needed, may want to decr to 1x/wk due to finances     Laureen Abrahams, PT, DPT 01/25/22 3:21 PM

## 2022-01-29 ENCOUNTER — Ambulatory Visit: Payer: Medicare HMO | Admitting: Physical Therapy

## 2022-01-29 ENCOUNTER — Encounter: Payer: Self-pay | Admitting: Physical Therapy

## 2022-01-29 DIAGNOSIS — M6281 Muscle weakness (generalized): Secondary | ICD-10-CM

## 2022-01-29 DIAGNOSIS — R600 Localized edema: Secondary | ICD-10-CM | POA: Diagnosis not present

## 2022-01-29 DIAGNOSIS — M25611 Stiffness of right shoulder, not elsewhere classified: Secondary | ICD-10-CM | POA: Diagnosis not present

## 2022-01-29 DIAGNOSIS — M25511 Pain in right shoulder: Secondary | ICD-10-CM | POA: Diagnosis not present

## 2022-01-29 NOTE — Therapy (Signed)
OUTPATIENT PHYSICAL THERAPY SHOULDER TREATMENT   Patient Name: [name] MRN: [id number] DOB:April 27, 1955, 66 y.o., female Today's Date: 01/29/2022  END OF SESSION:  PT End of Session - 01/29/22 1010     Visit Number 4    Number of Visits 20    Date for PT Re-Evaluation 03/26/22    Progress Note Due on Visit 10    PT Start Time 1013    PT Stop Time 1054    PT Time Calculation (min) 41 min    Activity Tolerance Patient tolerated treatment well    Behavior During Therapy WFL for tasks assessed/performed                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   Hypothyroidism    Intermittent palpitations    cardiology--  dr [place]   Interstitial cystitis    Limited jaw range of motion    s/p  bilateral TMJ surgery,  age 86s   Migraines    Moderate persistent asthma    pulmologist-- dr Halford Chessman   OA (osteoarthritis)    both knees   OSA on CPAP    per last sleep study 09/ 2017  mild OSA, AHI13/hr   PONV (postoperative nausea and vomiting)    PVC (premature ventricular contraction)    Rosacea    Sensation of pressure in bladder area    per pt intermittant   TMJ arthralgia    Urticaria    Wears glasses    White coat syndrome without hypertension    05-25-2017  per pt hx hypertension yrs ago, no issues after quitting stressful job   Past Surgical History:  Procedure Laterality Date   10 HOUR [place] STUDY N/A 09/22/2015   Procedure: 24 HOUR [place] STUDY;   Surgeon: Doran Stabler, MD;  Location: [facility];  Service: Gastroenterology;  Laterality: N/A;   ADENOIDECTOMY     ANAL FISSURE REPAIR  age 67  (77)   BREAST EXCISIONAL BIOPSY Right 04-16-2003  dr p. young  [facility]   benign   BUNIONECTOMY Right early 2000s   CARPAL TUNNEL RELEASE Left age 66 (73)   CHOLECYSTECTOMY OPEN  age 18   CYSTO WITH HYDRODISTENSION N/A 06/02/2017   Procedure: CYSTOSCOPY/HYDRODISTENSION OF BLADDER;  Surgeon: Irine Seal, MD;  Location[facility];  Service: Urology;  Laterality: N/A;   CYSTO/ HYDRODISTENTION/  INSTILLATION THERAPY  1990s   DX LAPAROSCOPY/  DX HYSTEROSCOPY/  D & C  08-07-2007   dr dove  [place]  10-21-2008   dr Harolyn Rutherford  [facility]   ESOPHAGEAL MANOMETRY N/A 09/22/2015   Procedure: ESOPHAGEAL MANOMETRY (EM);  Surgeon: Doran Stabler, MD;  Location: [facility];  Service: Gastroenterology;  Laterality: N/A;  with impedence    FINGER ARTHROPLASTY Bilateral  left little finger 2016:  right middle finger 06/ 2018   KNEE ARTHROSCOPY Bilateral x3 left ;  x3  right-- last one age 83 (73)   NASAL SEPTUM SURGERY  age 51s   ROBOT ASSISTED REDUCTION PARAESOPHAGEAL HIATAL HERNIA/ TYPE 2 MEDIASTINAL DISSECTION/ PRIMARY HIATAL HERNIA REPAIR/ ANTERIOR & POSTERIOR GASTROPEXY/ NISSEN FUNDOPLICATION  [phone]   DR GROSS  [facility]   SINOSCOPY     TEMPOROMANDIBULAR JOINT SURGERY Bilateral x3  -- age 64s   per pt used graft   TONSILLECTOMY     TOTAL KNEE ARTHROPLASTY Left 12/26/2017   Procedure: LEFT TOTAL KNEE ARTHROPLASTY;  Surgeon: Gaynelle Arabian, MD;  Location: [facility];  Service: Orthopedics;  Laterality: Left;  88mn   TRANSTHORACIC ECHOCARDIOGRAM  12-06-2017   dr jMartinique  ef 55-60%, grade 1 diastoilc dysfunction, trivial MR   TRIGGER FINGER RELEASE Bilateral last one 2017   several release's bilaterally   ULNAR NERVE TRANSPOSITION Bilateral age 66(1980)   Patient Active Problem List   Diagnosis Date Noted    Hyperlipidemia LDL goal <100 08/24/2021   Encounter for general adult medical examination with abnormal findings 03/14/2021   Estrogen deficiency 03/09/2021   Cervical cancer screening 03/09/2021   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   Grade I diastolic dysfunction 016/60/6301  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[place] 07/27/2019   Osteoarthritis of glenohumeral joint 03/26/2019   Iron deficiency anemia 04/28/2018   OA (osteoarthritis) of knee 12/26/2017   PVC (premature ventricular contraction) 11/21/2017   Dysphonia 09/26/2017   Vitamin D deficiency, unspecified 10/13/2016   Primary osteoarthritis of left hand 06/30/2016   Osteoporosis without current pathological fracture 03/22/2016   Paraesophageal hiatal hernia s/p robotic repair & fundoplication 16/01/0932035/57/3220  Bipolar 1 disorder, mixed, moderate ([place] 01/29/2016   Class 1 obesity with body mass index (BMI) of 32.0 to 32.9 in adult 01/06/2016   Migraines 11/21/2014   OSA on CPAP 01/30/2007   GERD 11/02/2006   DERMATITIS, HANDS 11/02/2006   Hypothyroidism 10/07/2006   Bipolar affective ([place] 10/07/2006   Essential hypertension 10/07/2006   RHINITIS, CHRONIC 10/07/2006   Seasonal and perennial allergic rhinitis 10/07/2006   Moderate persistent asthma, uncomplicated 025/42/7062  Irritable bowel syndrome 10/07/2006   INTERSTITIAL CYSTITIS 10/07/2006   ROSACEA 10/07/2006   Osteoarthritis of both knees 10/07/2006   Disturbance in sleep behavior 10/07/2006    PCP:  JJanith Lima MD   REFERRING PROVIDER: BMcarthur Rossetti MD  REFERRING DIAG: M75.41 (ICD-10-CM) - Impingement syndrome of right shoulder  THERAPY DIAG:  Acute pain of right shoulder  Stiffness of right shoulder, not elsewhere classified  Localized edema  Muscle weakness (generalized)  Rationale for Evaluation and Treatment: Rehabilitation  ONSET DATE: 12/24/21  SUBJECTIVE:  SUBJECTIVE STATEMENT: No pain upon arrival  PERTINENT HISTORY: Rt shoulder arthroscopy 12/24/21 DM2, anxiety, bipolar, depression, eczema, h/o melanoma 2000's, OA, migraines, white coat symdrome without HTN, TKA-left 2019, knee arthroscopy bil 1999,   PAIN:  NPRS scale: 0/10 Pain location: Rt shoulder Pain description: achy,  Aggravating factors: resting Relieving factors: ice, pt stating she has pain meds but hasn't been using them  PRECAUTIONS: no lifting over 5 pounds, shoulder arthroscopy on 12/24/21  WEIGHT BEARING RESTRICTIONS: No  FALLS:  Has patient fallen in last 6 months? Yes, pt stated she was pushing a recycle can down the driveway and she fell down when the can got out of control in March of 2023.   LIVING ENVIRONMENT: Lives with: lives with their family and lives with their spouse Lives in: House/apartment Stairs: Yes: External: 3 steps; none Has following equipment at home: None  OCCUPATION: On disability, pt volunteers at church  PLOF: [place] hurting, reach the back of my head, donning clothes without pain or difficutly    OBJECTIVE:   PATIENT SURVEYS:  01/12/22: FOTO intake:   48%  POSTURE: 01/12/22: rounded shoulders, forward head   UPPER EXTREMITY ROM:   ROM Right 01/12/22 Left 01/12/22 Right 01/25/22  Shoulder flexion 138 155 140  (Siting)  Shoulder extension 30 40   Shoulder abduction 120 160 114 (Sitting)  Shoulder adduction     Shoulder internal rotation 55 62   Shoulder external rotation 60 70   Elbow flexion 150 152   Elbow extension 0 0   (Blank rows = not tested)  UPPER EXTREMITY MMT:  MMT Right 01/12/22  Left 01/12/22  Shoulder flexion 3/5 4/5  Shoulder extension 3/5 4/5  Shoulder abduction    Shoulder adduction    Shoulder internal rotation 3/5 4/5  Shoulder external rotation 3/5 4/5  (Blank rows = not tested)   PALPATION:  TTP: Right anterior, lateral and posterior.    TODAY'S TREATMENT:                                                                                                                           DATE: 01/29/22 TherEx: Rows L3 band 2 x 10 Rt IR with L3 band 2 x 10 Rt ER with L3 band 2 x 10  Rt shoulder flexion to 90 deg 2x10; 1# standing Rt shoulder abduction to 90 deg 2x10; 1# standing Standing AA flexion x 20 reps; 1# bar Standing AA Rt abduction x 20 reps; 1# bar; active eccentric on Rt Wall ladder with 1# wrist weight flexion and scaption x 10 each   01/25/22 TherEx: Rows L3 band 2 x 10 Rt IR with L3 band 2 x 10 Rt ER with L3 band 2 x 10  Bil shoulder ext L3 band 2 x 10 AA shoulder flexion 2# bar in supine x 20 reps Supine Rt shoulder flexion 2# 2 x 10 Sidelying Rt abduction to 90 deg 2 x 10  01/19/2022:  Therex: -  Supine OH flexion with 1# dowel, x20 with 2-3 sec hold at end range - Supine OH ER with 1# dowel, x20 with 2-3 sec hold at end range - Supine serratus punches with 1# dowel, x20 with 2-3 sec hold at end range - Lat pull downs with GTB 2x10  - Rows with GTB 2x10  - IR with GTB 2x10  - ER with GTB 2x10   01/12/22:  Therex:    HEP instruction/performance c cues for techniques, handout provided.  Trial set performed of each for comprehension and symptom assessment.  See below for exercise list   PATIENT EDUCATION: Education details: HEP, POC Person educated: Patient Education method: Explanation, Demonstration, Verbal cues, and Handouts Education comprehension: verbalized understanding, returned demonstration, and verbal cues required  HOME EXERCISE PROGRAM: Access Code: [email or web address] URL: [email or web address] Date:  01/12/2022 Prepared by: Kearney Hard  Exercises - Supine Shoulder Flexion Extension AAROM with Dowel  - 2-3 x daily - 7 x weekly - 10 reps - Supine Shoulder External Rotation in 45 Degrees Abduction AAROM with Dowel  - 2-3 x daily - 7 x weekly - 10 reps - Standing Row with Anchored Resistance  - 2-3 x daily - 7 x weekly - 2 sets - 10 reps - Seated Shoulder Flexion Towel Slide at Table Top Full Range of Motion  - 2-3 x daily - 7 x weekly - 10 reps - 5 seconds hold - Seated Shoulder External Rotation PROM on Table  - 2-3 x daily - 7 x weekly - 10 reps - 5 seconds hold  ASSESSMENT:  CLINICAL IMPRESSION: Pt tolerated session well today with continued light strengthening exercises, AROM overall good for standing, and with minimal pain.  Will continue to benefit from PT to maximize function.  OBJECTIVE IMPAIRMENTS: decreased mobility, decreased ROM, decreased strength, increased edema, impaired UE functional use, and pain.   ACTIVITY LIMITATIONS: carrying, lifting, sleeping, bathing, dressing, and reach over head  PARTICIPATION LIMITATIONS: community activity  PERSONAL FACTORS:  see pertinent history above  are also affecting patient's functional outcome.   REHAB POTENTIAL: Good  CLINICAL DECISION MAKING: Stable/uncomplicated  EVALUATION COMPLEXITY: Moderate   GOALS: Goals reviewed with patient? Yes  SHORT TERM GOALS: (target date for Short term goals are 3 weeks 02/05/22)   1.Patient will demonstrate independent use of home exercise program to maintain progress from in clinic treatments. Goal status: New  LONG TERM GOALS: (target dates for all long term goals are 10 weeks  03/26/2022)    1. Patient will demonstrate/report pain at worst less than or equal to 2/10 to facilitate minimal limitation in daily activity secondary to pain symptoms. Goal status: New   2. Patient will demonstrate independent use of home exercise program to facilitate ability to maintain/progress  functional gains from skilled physical therapy services. Goal status: New   3. Patient will demonstrate FOTO outcome > or = 61 % to indicate reduced disability due to condition. Goal status: New   4.  Patient will demonstrate right  UE MMT 5/5 throughout to facilitate lifting, reaching, carrying at [facility] in daily activity.   Goal status: New   5.  Patient will demonstrate right Corinne joint AROM WFL s symptoms to facilitate usual overhead reaching, self care, dressing at PLOF.    Goal status: New   6.  Pt will improve her Rt shoulder ER to >/= 70 degrees for ADL's.   Goal status: New  7. Pt will be able to lift 5 # from floor to counter height  using her Rt UE.    Goal Status: New  PLAN:  PT FREQUENCY: 1-2x/week  PT DURATION: 10 weeks  PLANNED INTERVENTIONS: Therapeutic exercises, Therapeutic activity, Neuro Muscular re-education, Balance training, Gait training, Patient/Family education, Joint mobilization, Stair training, DME instructions, Dry Needling, Electrical stimulation, Traction, Cryotherapy, vasopneumatic device Moist heat, Taping, Ultrasound, Ionotophoresis '4mg'$ /ml Dexamethasone, and Manual therapy.  All included unless contraindicated  PLAN FOR NEXT SESSION: Shoulder ROM/strengthening, manual therapy as needed; pt requesting no UBE as seat doesn't fit her well    Laureen Abrahams, PT, DPT 01/29/22 10:54 AM

## 2022-02-03 ENCOUNTER — Ambulatory Visit: Payer: Medicare HMO | Admitting: Rehabilitative and Restorative Service Providers"

## 2022-02-03 ENCOUNTER — Encounter: Payer: Self-pay | Admitting: Rehabilitative and Restorative Service Providers"

## 2022-02-03 ENCOUNTER — Encounter: Payer: Self-pay | Admitting: Orthopaedic Surgery

## 2022-02-03 ENCOUNTER — Ambulatory Visit (INDEPENDENT_AMBULATORY_CARE_PROVIDER_SITE_OTHER): Payer: Medicare HMO | Admitting: Orthopaedic Surgery

## 2022-02-03 DIAGNOSIS — R600 Localized edema: Secondary | ICD-10-CM

## 2022-02-03 DIAGNOSIS — M25511 Pain in right shoulder: Secondary | ICD-10-CM

## 2022-02-03 DIAGNOSIS — M25611 Stiffness of right shoulder, not elsewhere classified: Secondary | ICD-10-CM

## 2022-02-03 DIAGNOSIS — M25551 Pain in right hip: Secondary | ICD-10-CM

## 2022-02-03 DIAGNOSIS — Z9889 Other specified postprocedural states: Secondary | ICD-10-CM

## 2022-02-03 DIAGNOSIS — M6281 Muscle weakness (generalized): Secondary | ICD-10-CM | POA: Diagnosis not present

## 2022-02-03 NOTE — Therapy (Signed)
OUTPATIENT PHYSICAL THERAPY SHOULDER TREATMENT   Patient Name: [name] MRN: [id number] DOB:Aug 04, 1955, 66 y.o., female Today's Date: 02/03/2022  END OF SESSION:  PT End of Session - 02/03/22 1346     Visit Number 5    Number of Visits 20    Date for PT Re-Evaluation 03/26/22    Progress Note Due on Visit 10    PT Start Time 4562    PT Stop Time 1428    PT Time Calculation (min) 43 min    Activity Tolerance Patient tolerated treatment well;No increased pain    Behavior During Therapy WFL for tasks assessed/performed              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   Hypothyroidism    Intermittent palpitations    cardiology--  dr [place]   Interstitial cystitis    Limited jaw range of motion    s/p  bilateral TMJ surgery,  age 50s   Migraines    Moderate persistent asthma    pulmologist-- dr Halford Chessman   OA (osteoarthritis)    both knees   OSA on CPAP    per last sleep study 09/ 2017  mild OSA, AHI13/hr   PONV (postoperative nausea and vomiting)    PVC (premature ventricular contraction)    Rosacea    Sensation of pressure in bladder area    per pt intermittant   TMJ arthralgia    Urticaria    Wears glasses    White coat syndrome without hypertension    05-25-2017  per pt hx hypertension yrs ago, no issues after quitting stressful job   Past Surgical History:  Procedure Laterality Date   24 HOUR [place] STUDY N/A 09/22/2015   Procedure: 24 HOUR  [place] STUDY;  Surgeon: Doran Stabler, MD;  Location: [facility];  Service: Gastroenterology;  Laterality: N/A;   ADENOIDECTOMY     ANAL FISSURE REPAIR  age 67  (45)   BREAST EXCISIONAL BIOPSY Right 04-16-2003  dr p. young  [facility]   benign   BUNIONECTOMY Right early 2000s   CARPAL TUNNEL RELEASE Left age 80 (5)   CHOLECYSTECTOMY OPEN  age 38   CYSTO WITH HYDRODISTENSION N/A 06/02/2017   Procedure: CYSTOSCOPY/HYDRODISTENSION OF BLADDER;  Surgeon: Irine Seal, MD;  Location: [facility];  Service: Urology;  Laterality: N/A;   CYSTO/ HYDRODISTENTION/  INSTILLATION THERAPY  1990s   DX LAPAROSCOPY/  DX HYSTEROSCOPY/  D & C  08-07-2007   dr dove  [place]  10-21-2008   dr Harolyn Rutherford  [facility]   ESOPHAGEAL MANOMETRY N/A 09/22/2015   Procedure: ESOPHAGEAL MANOMETRY (EM);  Surgeon: Doran Stabler, MD;  Location: [facility];  Service: Gastroenterology;  Laterality: N/A;  with impedence    FINGER ARTHROPLASTY Bilateral  left little finger 2016:  right middle finger 06/ 2018   KNEE ARTHROSCOPY Bilateral x3 left ;  x3  right-- last one age 82 (82)   NASAL SEPTUM SURGERY  age 39s   ROBOT ASSISTED REDUCTION PARAESOPHAGEAL HIATAL HERNIA/ TYPE 2 MEDIASTINAL DISSECTION/ PRIMARY HIATAL HERNIA REPAIR/ ANTERIOR & POSTERIOR GASTROPEXY/ NISSEN FUNDOPLICATION  [id number]   DR GROSS  [facility]   SINOSCOPY     TEMPOROMANDIBULAR JOINT SURGERY Bilateral x3  -- age 68s   per pt used graft   TONSILLECTOMY     TOTAL KNEE ARTHROPLASTY Left 12/26/2017   Procedure: LEFT TOTAL KNEE ARTHROPLASTY;  Surgeon: Gaynelle Arabian, MD;  Location: [facility];  Service: Orthopedics;  Laterality: Left;  41mn   TRANSTHORACIC ECHOCARDIOGRAM  12-06-2017   dr jMartinique  ef 55-60%, grade 1 diastoilc dysfunction, trivial MR   TRIGGER FINGER RELEASE Bilateral last one 2017   several release's bilaterally   ULNAR NERVE TRANSPOSITION Bilateral age 66(1980)   Patient Active Problem List   Diagnosis Date  Noted   Hyperlipidemia LDL goal <100 08/24/2021   Encounter for general adult medical examination with abnormal findings 03/14/2021   Estrogen deficiency 03/09/2021   Cervical cancer screening 03/09/2021   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   Grade I diastolic dysfunction 014/11/3011  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   Osteoarthritis of glenohumeral joint 03/26/2019   Iron deficiency anemia 04/28/2018   OA (osteoarthritis) of knee 12/26/2017   PVC (premature ventricular contraction) 11/21/2017   Dysphonia 09/26/2017   Vitamin D deficiency, unspecified 10/13/2016   Primary osteoarthritis of left hand 06/30/2016   Osteoporosis without current pathological fracture 03/22/2016   Paraesophageal hiatal hernia s/p robotic repair & fundoplication 11/43/8887057/97/2820  Bipolar 1 disorder, mixed, moderate ([place] 01/29/2016   Class 1 obesity with body mass index (BMI) of 32.0 to 32.9 in adult 01/06/2016   Migraines 11/21/2014   OSA on CPAP 01/30/2007   GERD 11/02/2006   DERMATITIS, HANDS 11/02/2006   Hypothyroidism 10/07/2006   Bipolar affective ([place] 10/07/2006   Essential hypertension 10/07/2006   RHINITIS, CHRONIC 10/07/2006   Seasonal and perennial allergic rhinitis 10/07/2006   Moderate persistent asthma, uncomplicated 060/15/6153  Irritable bowel syndrome 10/07/2006   INTERSTITIAL CYSTITIS 10/07/2006   ROSACEA 10/07/2006   Osteoarthritis of both knees 10/07/2006   Disturbance in sleep behavior 10/07/2006    PCP:  JJanith Lima MD   REFERRING PROVIDER: BMcarthur Rossetti MD  REFERRING DIAG: M75.41 (ICD-10-CM) - Impingement syndrome of right shoulder  THERAPY DIAG:  Acute pain of right shoulder  Stiffness of right shoulder, not elsewhere classified  Localized edema  Muscle weakness (generalized)  Rationale for Evaluation and Treatment: Rehabilitation  ONSET DATE: 12/24/21  SUBJECTIVE:  SUBJECTIVE STATEMENT: Doralyn reports functional progress with her right shoulder status post right shoulder arthroscopy approximately 6 weeks ago.  Pain has not been "very bad."  PERTINENT HISTORY: Rt shoulder arthroscopy 12/24/21 DM2, anxiety, bipolar, depression, eczema, h/o melanoma 2000's, OA, migraines, white coat symdrome without HTN, TKA-left 2019, knee arthroscopy bil 1999,   PAIN:  NPRS scale: 1-4/10 this week Pain location: Rt shoulder Pain description: achy, sore Aggravating factors: sleeping on the right side Relieving factors: ice, pt stating she has pain meds but hasn't been using them  PRECAUTIONS: no lifting over 5 pounds, shoulder arthroscopy on 12/24/21  WEIGHT BEARING RESTRICTIONS: No  FALLS:  Has patient fallen in last 6 months? Yes, pt stated she was pushing a recycle can down the driveway and she fell down when the can got out of control in March of 2023.   LIVING ENVIRONMENT: Lives with: lives with their family and lives with their spouse Lives in: House/apartment Stairs: Yes: External: 3 steps; none Has following equipment at home: None  OCCUPATION: On disability, pt volunteers at church  PLOF: [place] hurting, reach the back of my head, donning clothes without pain or difficutly    OBJECTIVE:   PATIENT SURVEYS:  01/12/22: FOTO intake:   48%  POSTURE: 01/12/22: rounded shoulders, forward head   UPPER EXTREMITY ROM:   ROM Right 01/12/22 Left 01/12/22 Right 01/25/22 Right 02/03/2022  Shoulder flexion 138 155 140  (Siting) 150  Shoulder extension 30 40    Shoulder abduction 120 160 114 (Sitting)   Shoulder horizontal  adduction    20  Shoulder internal rotation 55 62  25  Shoulder external rotation 60 70  90  Elbow flexion 150 152    Elbow extension 0 0    (Blank rows = not tested)  UPPER EXTREMITY MMT:  MMT Right 01/12/22 Left 01/12/22  Shoulder flexion 3/5 4/5  Shoulder extension 3/5 4/5  Shoulder abduction    Shoulder adduction    Shoulder internal rotation 3/5 4/5  Shoulder external rotation 3/5 4/5  (Blank rows = not tested)   PALPATION:  TTP: Right anterior, lateral and posterior.    TODAY'S TREATMENT:                                                                                                                           DATE: 02/03/22 Supine arm raises 2 sets of 20 for 3 seconds (2# and 3#) Supine IR stretch 10X 10 seconds Thumb up the back 10X 10 seconds Pull to chest/Rows Green 20X 3 seconds Shoulder IR Green theraband 2 sets of 10 slow eccentrics  Shoulder ER Green theraband 2 sets of 10 slow eccentrics   01/29/22 TherEx: Rows L3 band 2 x 10 Rt IR with L3 band 2 x 10 Rt ER with L3 band 2 x 10  Rt shoulder flexion to 90 deg 2x10; 1# standing Rt shoulder abduction to 90 deg 2x10; 1# standing Standing AA flexion x 20  reps; 1# bar Standing AA Rt abduction x 20 reps; 1# bar; active eccentric on Rt Wall ladder with 1# wrist weight flexion and scaption x 10 each   01/25/22 TherEx: Rows L3 band 2 x 10 Rt IR with L3 band 2 x 10 Rt ER with L3 band 2 x 10  Bil shoulder ext L3 band 2 x 10 AA shoulder flexion 2# bar in supine x 20 reps Supine Rt shoulder flexion 2# 2 x 10 Sidelying Rt abduction to 90 deg 2 x 10   PATIENT EDUCATION: Education details: HEP, POC Person educated: Patient Education method: Consulting civil engineer, Demonstration, Verbal cues, and Handouts Education comprehension: verbalized understanding, returned demonstration, and verbal cues required  HOME EXERCISE PROGRAM: Access Code: [email or web address] URL: [email or web address] Date: 02/03/2022 Prepared by:  Vista Mink  Exercises - Supine Shoulder Flexion Extension AAROM with Dowel  - 2-3 x daily - 7 x weekly - 10 reps - Supine Shoulder External Rotation in 45 Degrees Abduction AAROM with Dowel  - 2-3 x daily - 7 x weekly - 10 reps - Standing Row with Anchored Resistance  - 2-3 x daily - 7 x weekly - 2 sets - 10 reps - Seated Shoulder Flexion Towel Slide at Table Top Full Range of Motion  - 2-3 x daily - 7 x weekly - 10 reps - 5 seconds hold - Seated Shoulder External Rotation PROM on Table  - 2-3 x daily - 7 x weekly - 10 reps - 5 seconds hold - Shoulder External Rotation with Anchored Resistance  - 2 x daily - 7 x weekly - 2 sets - 10 reps - Shoulder extension with resistance - Neutral  - 2 x daily - 7 x weekly - 2 sets - 10 reps - Shoulder Internal Rotation with Resistance  - 2 x daily - 7 x weekly - 2 sets - 10 reps - Supine Scapular Protraction in Flexion with Dumbbells  - 1 x daily - 7 x weekly - 2 sets - 20 reps - 3 seconds hold - Standing Shoulder Internal Rotation Stretch with Hands Behind Back  - 2 x daily - 7 x weekly - 1 sets - 10 reps - 10 seconds hold - Supine Shoulder Internal Rotation Stretch  - 2 x daily - 7 x weekly - 1 sets - 20 reps - 10 seconds hold  ASSESSMENT:  CLINICAL IMPRESSION: Sherhonda is happy with her progress in physical therapy thus far.  She does note some soreness when sleeping on the right side and with reaching and overhead function.  She did have a tight posterior capsule today and this will be addressed moving forward as her other AROM and strength is progressing well.  Continue overall capsular flexibility, strength and functional work to  meet long-term goals.  OBJECTIVE IMPAIRMENTS: decreased mobility, decreased ROM, decreased strength, increased edema, impaired UE functional use, and pain.   ACTIVITY LIMITATIONS: carrying, lifting, sleeping, bathing, dressing, and reach over head  PARTICIPATION LIMITATIONS: community activity  PERSONAL FACTORS:  see  pertinent history above  are also affecting patient's functional outcome.   REHAB POTENTIAL: Good  CLINICAL DECISION MAKING: Stable/uncomplicated  EVALUATION COMPLEXITY: Moderate   GOALS: Goals reviewed with patient? Yes  SHORT TERM GOALS: (target date for Short term goals are 3 weeks 02/05/22)   1.Patient will demonstrate independent use of home exercise program to maintain progress from in clinic treatments. Goal status: On Going 02/03/2022  LONG TERM GOALS: (target dates for all long term goals are 10 weeks  03/26/2022)  1. Patient will demonstrate/report pain at worst less than or equal to 2/10 to facilitate minimal limitation in daily activity secondary to pain symptoms. Goal status: On Going 02/03/2022   2. Patient will demonstrate independent use of home exercise program to facilitate ability to maintain/progress functional gains from skilled physical therapy services. Goal status: On Going 02/03/2022   3. Patient will demonstrate FOTO outcome > or = 61 % to indicate reduced disability due to condition. Goal status: New   4.  Patient will demonstrate right  UE MMT 5/5 throughout to facilitate lifting, reaching, carrying at [facility] in daily activity.   Goal status: New   5.  Patient will demonstrate right [place] joint AROM WFL s symptoms to facilitate usual overhead reaching, self care, dressing at PLOF.    Goal status: New   6.  Pt will improve her Rt shoulder ER to >/= 70 degrees for ADL's.   Goal status: Met 02/03/2022  7. Pt will be able to lift 5 # from floor to counter height using her Rt UE.    Goal Status: New  PLAN:  PT FREQUENCY: 1-2x/week  PT DURATION: 10 weeks  PLANNED INTERVENTIONS: Therapeutic exercises, Therapeutic activity, Neuro Muscular re-education, Balance training, Gait training, Patient/Family education, Joint mobilization, Stair training, DME instructions, Dry Needling, Electrical stimulation, Traction, Cryotherapy, vasopneumatic device Moist  heat, Taping, Ultrasound, Ionotophoresis 4m/ml Dexamethasone, and Manual therapy.  All included unless contraindicated  PLAN FOR NEXT SESSION: Shoulder AROM/strengthening, manual therapy as needed, pt requesting no UBE as seat doesn't fit her well, consider a posterior capsule stretch to address 20 degrees of horizontal adduction    RFarley Ly PT, MPT 02/03/22 5:05 PM

## 2022-02-03 NOTE — Progress Notes (Signed)
The patient is here for continued follow-up as a relates to her right shoulder.  We perform arthroscopic intervention about 6 weeks ago for the right shoulder with removing the loose body and performing a subacromial decompression.  She has been in outpatient physical therapy and said the shoulder is doing much better overall.  She does not take any medications for pain and she is improving in terms of range of motion and strength of the right shoulder.  On my exam her right shoulder is moving much more smoothly.  She is using her rotator cuff appropriately to abduct her shoulder.  There is no significant grinding at the glenohumeral joint.  She will continue her outpatient physical therapy.  At the end of the visit she did let me know that she has been having right hip and groin pain especially when she has been sitting for long period of time and goes up to stand and walk.  On my exam her hip moves smoothly and fluidly but she has having some pain in the groin.  I would like to send her to my partner Dr. Rolena Infante for evaluation of her right hip in terms of a steroid injection under ultrasound in the right hip joint.  At that visit I would also like him to x-ray her right hip and then get her back to me in follow-up for the right hip 2 weeks after his injection.  She agrees with this treatment plan.

## 2022-02-05 ENCOUNTER — Telehealth: Payer: Self-pay | Admitting: Orthopaedic Surgery

## 2022-02-05 NOTE — Telephone Encounter (Signed)
Advised and helped pt schedule f/u

## 2022-02-05 NOTE — Telephone Encounter (Signed)
Patient called in stating she is unsure of her weight baring restrictions being she had surgery and she is also unsure of when she should come in for her next appt, please adivse

## 2022-02-09 ENCOUNTER — Encounter: Payer: Medicare HMO | Admitting: Rehabilitative and Restorative Service Providers"

## 2022-02-11 ENCOUNTER — Encounter: Payer: Medicare HMO | Admitting: Rehabilitative and Restorative Service Providers"

## 2022-02-13 ENCOUNTER — Telehealth: Payer: Self-pay | Admitting: Pulmonary Disease

## 2022-02-13 NOTE — Telephone Encounter (Signed)
Please order/ change ordered sleep study to a split night sleep study

## 2022-02-13 NOTE — Telephone Encounter (Signed)
-----   Message from Dawayne Cirri sent at 02/04/2022  1:53 PM EST ----- Regarding: In Lab Study Good Afternoon,  You ordered a NPSG CPT Code 91675. The insurance company states patient meets the guidelines for Split Night [phone] and this has been approved. Can you change the order to a split night?

## 2022-02-15 ENCOUNTER — Other Ambulatory Visit: Payer: Self-pay

## 2022-02-15 ENCOUNTER — Encounter: Payer: Self-pay | Admitting: Physical Therapy

## 2022-02-15 ENCOUNTER — Ambulatory Visit: Payer: Medicare HMO | Admitting: Physical Therapy

## 2022-02-15 DIAGNOSIS — G4733 Obstructive sleep apnea (adult) (pediatric): Secondary | ICD-10-CM

## 2022-02-15 DIAGNOSIS — M25611 Stiffness of right shoulder, not elsewhere classified: Secondary | ICD-10-CM | POA: Diagnosis not present

## 2022-02-15 DIAGNOSIS — M25511 Pain in right shoulder: Secondary | ICD-10-CM | POA: Diagnosis not present

## 2022-02-15 DIAGNOSIS — M6281 Muscle weakness (generalized): Secondary | ICD-10-CM

## 2022-02-15 DIAGNOSIS — R600 Localized edema: Secondary | ICD-10-CM

## 2022-02-15 NOTE — Telephone Encounter (Signed)
Order signed.

## 2022-02-15 NOTE — Telephone Encounter (Signed)
I have placed the order for the split night study.  Dr. Ander Slade can you please sign?

## 2022-02-15 NOTE — Therapy (Signed)
OUTPATIENT PHYSICAL THERAPY SHOULDER TREATMENT   Patient Name: [name] MRN: [id number] DOB:1955/08/03, 67 y.o., female Today's Date: 02/15/2022  END OF SESSION:  PT End of Session - 02/15/22 1439     Visit Number 6    Number of Visits 20    Date for PT Re-Evaluation 03/26/22    Progress Note Due on Visit 10    PT Start Time 1425    PT Stop Time 1513    PT Time Calculation (min) 48 min    Activity Tolerance Patient tolerated treatment well;No increased pain    Behavior During Therapy WFL for tasks assessed/performed               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   Hypothyroidism    Intermittent palpitations    cardiology--  dr [place]   Interstitial cystitis    Limited jaw range of motion    s/p  bilateral TMJ surgery,  age 25s   Migraines    Moderate persistent asthma    pulmologist-- dr Halford Chessman   OA (osteoarthritis)    both knees   OSA on CPAP    per last sleep study 09/ 2017  mild OSA, AHI13/hr   PONV (postoperative nausea and vomiting)    PVC (premature ventricular contraction)    Rosacea    Sensation of pressure in bladder area    per pt intermittant   TMJ arthralgia    Urticaria    Wears glasses    White coat syndrome without hypertension    05-25-2017  per pt hx hypertension yrs ago, no issues after quitting stressful job   Past Surgical History:  Procedure Laterality Date   24 HOUR [place] STUDY N/A 09/22/2015   Procedure: 24 HOUR  [place] STUDY;  Surgeon: Doran Stabler, MD;  [facility];  Service: Gastroenterology;  Laterality: N/A;   ADENOIDECTOMY     ANAL FISSURE REPAIR  age 20  (30)   BREAST EXCISIONAL BIOPSY Right 04-16-2003  dr p. young  [facility]   benign   BUNIONECTOMY Right early 2000s   CARPAL TUNNEL RELEASE Left age 51 (28)   CHOLECYSTECTOMY OPEN  age 7   CYSTO WITH HYDRODISTENSION N/A 06/02/2017   Procedure: CYSTOSCOPY/HYDRODISTENSION OF BLADDER;  Surgeon: Irine Seal, MD;  Location: [facility];  Service: Urology;  Laterality: N/A;   CYSTO/ HYDRODISTENTION/  INSTILLATION THERAPY  1990s   DX LAPAROSCOPY/  DX HYSTEROSCOPY/  D & C  08-07-2007   dr dove  [place]  10-21-2008   dr Harolyn Rutherford  [facility]   ESOPHAGEAL MANOMETRY N/A 09/22/2015   Procedure: ESOPHAGEAL MANOMETRY (EM);  Surgeon: Doran Stabler, MD;  [facility];  Service: Gastroenterology;  Laterality: N/A;  with impedence    FINGER ARTHROPLASTY Bilateral  left little finger 2016:  right middle finger 06/ 2018   KNEE ARTHROSCOPY Bilateral x3 left ;  x3  right-- last one age 82 (83)   NASAL SEPTUM SURGERY  age 39s   ROBOT ASSISTED REDUCTION PARAESOPHAGEAL HIATAL HERNIA/ TYPE 2 MEDIASTINAL DISSECTION/ PRIMARY HIATAL HERNIA REPAIR/ ANTERIOR & POSTERIOR GASTROPEXY/ NISSEN FUNDOPLICATION  [id number]   DR GROSS  [facility]   SINOSCOPY     TEMPOROMANDIBULAR JOINT SURGERY Bilateral x3  -- age 68s   per pt used graft   TONSILLECTOMY     TOTAL KNEE ARTHROPLASTY Left 12/26/2017   Procedure: LEFT TOTAL KNEE ARTHROPLASTY;  Surgeon: Gaynelle Arabian, MD;  [facility];  Service: Orthopedics;  Laterality: Left;  41mn   TRANSTHORACIC ECHOCARDIOGRAM  12-06-2017   dr jMartinique  ef 55-60%, grade 1 diastoilc dysfunction, trivial MR   TRIGGER FINGER RELEASE Bilateral last one 2017   several release's bilaterally   ULNAR NERVE TRANSPOSITION Bilateral age 67(1980)   Patient Active Problem List   Diagnosis Date  Noted   Hyperlipidemia LDL goal <100 08/24/2021   Encounter for general adult medical examination with abnormal findings 03/14/2021   Estrogen deficiency 03/09/2021   Cervical cancer screening 03/09/2021   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   Grade I diastolic dysfunction 014/11/3011  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   Osteoarthritis of glenohumeral joint 03/26/2019   Iron deficiency anemia 04/28/2018   OA (osteoarthritis) of knee 12/26/2017   PVC (premature ventricular contraction) 11/21/2017   Dysphonia 09/26/2017   Vitamin D deficiency, unspecified 10/13/2016   Primary osteoarthritis of left hand 06/30/2016   Osteoporosis without current pathological fracture 03/22/2016   Paraesophageal hiatal hernia s/p robotic repair & fundoplication 11/43/8887057/97/2820  Bipolar 1 disorder, mixed, moderate ([place] 01/29/2016   Class 1 obesity with body mass index (BMI) of 32.0 to 32.9 in adult 01/06/2016   Migraines 11/21/2014   OSA on CPAP 01/30/2007   GERD 11/02/2006   DERMATITIS, HANDS 11/02/2006   Hypothyroidism 10/07/2006   Bipolar affective ([place] 10/07/2006   Essential hypertension 10/07/2006   RHINITIS, CHRONIC 10/07/2006   Seasonal and perennial allergic rhinitis 10/07/2006   Moderate persistent asthma, uncomplicated 060/15/6153  Irritable bowel syndrome 10/07/2006   INTERSTITIAL CYSTITIS 10/07/2006   ROSACEA 10/07/2006   Osteoarthritis of both knees 10/07/2006   Disturbance in sleep behavior 10/07/2006    PCP:  JJanith Lima MD   REFERRING PROVIDER: BMcarthur Rossetti MD  REFERRING DIAG: M75.41 (ICD-10-CM) - Impingement syndrome of right shoulder  THERAPY DIAG:  Acute pain of right shoulder  Stiffness of right shoulder, not elsewhere classified  Localized edema  Muscle weakness (generalized)  Rationale for Evaluation and Treatment: Rehabilitation  ONSET DATE: 12/24/21  SUBJECTIVE:  SUBJECTIVE STATEMENT: Having more days with less pain   PERTINENT HISTORY: Rt shoulder arthroscopy 12/24/21 DM2, anxiety, bipolar, depression, eczema, h/o melanoma 2000's, OA, migraines, white coat symdrome without HTN, TKA-left 2019, knee arthroscopy bil 1999,   PAIN:  NPRS scale: 2/10 this week Pain location: Rt shoulder Pain description: achy, sore Aggravating factors: sleeping on the right side Relieving factors: ice, pt stating she has pain meds but hasn't been using them  PRECAUTIONS: no lifting over 5 pounds, shoulder arthroscopy on 12/24/21  WEIGHT BEARING RESTRICTIONS: No  FALLS:  Has patient fallen in last 6 months? Yes, pt stated she was pushing a recycle can down the driveway and she fell down when the can got out of control in March of 2023.   LIVING ENVIRONMENT: Lives with: lives with their family and lives with their spouse Lives in: House/apartment Stairs: Yes: External: 3 steps; none Has following equipment at home: None  OCCUPATION: On disability, pt volunteers at church  PLOF: [place] hurting, reach the back of my head, donning clothes without pain or difficutly    OBJECTIVE:   PATIENT SURVEYS:  01/12/22: FOTO intake:   48% 02/15/22: FOTO 55  POSTURE: 01/12/22: rounded shoulders, forward head   UPPER EXTREMITY ROM:   ROM Right 01/12/22 Left 01/12/22 Right 01/25/22 Right 02/03/2022  Shoulder flexion 138 155 140  (Siting) 150  Shoulder extension 30 40    Shoulder abduction 120 160 114 (Sitting)   Shoulder horizontal adduction    20  Shoulder internal rotation 55 62  25  Shoulder external rotation 60 70  90  Elbow  flexion 150 152    Elbow extension 0 0    (Blank rows = not tested)  UPPER EXTREMITY MMT:  MMT Right 01/12/22 Left 01/12/22  Shoulder flexion 3/5 4/5  Shoulder extension 3/5 4/5  Shoulder abduction    Shoulder adduction    Shoulder internal rotation 3/5 4/5  Shoulder external rotation 3/5 4/5  (Blank rows = not tested)   PALPATION:  TTP: Right anterior, lateral and posterior.    TODAY'S TREATMENT:                                                                                                                           DATE: 02/15/22 TherEx: Supine shoulder flexion with 2# bar AA 2x10; 3 sec hold Supine shoulder flexion on Rt 3# 2x10 Sidelying ER on Rt 3# 2x10 Sidelying Rt abduction to 90 deg 3# 2x10 Rows L4 band 2x10 Seated internal rotation stretch with strap 5x5 sec hold (modified HEP to this exercise as pt prefers this one) Bicep curl to overhead press 2# bil 2x10  02/03/22 Supine arm raises 2 sets of 20 for 3 seconds (2# and 3#) Supine IR stretch 10X 10 seconds Thumb up the back 10X 10 seconds Pull to chest/Rows Green 20X 3 seconds Shoulder IR Green theraband 2 sets of 10 slow eccentrics  Shoulder ER Green theraband 2 sets of 10  slow eccentrics   01/29/22 TherEx: Rows L3 band 2 x 10 Rt IR with L3 band 2 x 10 Rt ER with L3 band 2 x 10  Rt shoulder flexion to 90 deg 2x10; 1# standing Rt shoulder abduction to 90 deg 2x10; 1# standing Standing AA flexion x 20 reps; 1# bar Standing AA Rt abduction x 20 reps; 1# bar; active eccentric on Rt Wall ladder with 1# wrist weight flexion and scaption x 10 each   01/25/22 TherEx: Rows L3 band 2 x 10 Rt IR with L3 band 2 x 10 Rt ER with L3 band 2 x 10  Bil shoulder ext L3 band 2 x 10 AA shoulder flexion 2# bar in supine x 20 reps Supine Rt shoulder flexion 2# 2 x 10 Sidelying Rt abduction to 90 deg 2 x 10   PATIENT EDUCATION: Education details: HEP, POC Person educated: Patient Education method: Consulting civil engineer,  Demonstration, Verbal cues, and Handouts Education comprehension: verbalized understanding, returned demonstration, and verbal cues required  HOME EXERCISE PROGRAM: Access Code: [email or web address] URL: [email or web address] Date: 02/15/2022 Prepared by: Faustino Congress  Exercises - Supine Shoulder Flexion Extension AAROM with Dowel  - 2-3 x daily - 7 x weekly - 10 reps - Supine Shoulder External Rotation in 45 Degrees Abduction AAROM with Dowel  - 2-3 x daily - 7 x weekly - 10 reps - Standing Row with Anchored Resistance  - 2-3 x daily - 7 x weekly - 2 sets - 10 reps - Seated Shoulder Flexion Towel Slide at Table Top Full Range of Motion  - 2-3 x daily - 7 x weekly - 10 reps - 5 seconds hold - Seated Shoulder External Rotation PROM on Table  - 2-3 x daily - 7 x weekly - 10 reps - 5 seconds hold - Shoulder External Rotation with Anchored Resistance  - 2 x daily - 7 x weekly - 2 sets - 10 reps - Shoulder extension with resistance - Neutral  - 2 x daily - 7 x weekly - 2 sets - 10 reps - Shoulder Internal Rotation with Resistance  - 2 x daily - 7 x weekly - 2 sets - 10 reps - Supine Scapular Protraction in Flexion with Dumbbells  - 1 x daily - 7 x weekly - 2 sets - 20 reps - 3 seconds hold - Supine Shoulder Internal Rotation Stretch  - 2 x daily - 7 x weekly - 1 sets - 20 reps - 10 seconds hold - Standing Shoulder Internal Rotation Stretch with Towel  - 2 x daily - 7 x weekly - 1 sets - 10 reps - 10 sec hold  ASSESSMENT:  CLINICAL IMPRESSION: Pt has demonstrated improvement in FOTO score at this time, and is progressing well with PT.  Still having some tightness and Rt shoulder weakness, and will continue to benefit from PT to maximize function.   OBJECTIVE IMPAIRMENTS: decreased mobility, decreased ROM, decreased strength, increased edema, impaired UE functional use, and pain.   ACTIVITY LIMITATIONS: carrying, lifting, sleeping, bathing, dressing, and reach over  head  PARTICIPATION LIMITATIONS: community activity  PERSONAL FACTORS:  see pertinent history above  are also affecting patient's functional outcome.   REHAB POTENTIAL: Good  CLINICAL DECISION MAKING: Stable/uncomplicated  EVALUATION COMPLEXITY: Moderate   GOALS: Goals reviewed with patient? Yes  SHORT TERM GOALS: (target date for Short term goals are 3 weeks 02/05/22)   1.Patient will demonstrate independent use of home exercise program to maintain progress from in clinic treatments. Goal status: On  Going 02/03/2022  LONG TERM GOALS: (target dates for all long term goals are 10 weeks  03/26/2022)    1. Patient will demonstrate/report pain at worst less than or equal to 2/10 to facilitate minimal limitation in daily activity secondary to pain symptoms. Goal status: On Going 02/03/2022   2. Patient will demonstrate independent use of home exercise program to facilitate ability to maintain/progress functional gains from skilled physical therapy services. Goal status: On Going 02/03/2022   3. Patient will demonstrate FOTO outcome > or = 61 % to indicate reduced disability due to condition. Goal status: New   4.  Patient will demonstrate right  UE MMT 5/5 throughout to facilitate lifting, reaching, carrying at [facility] in daily activity.   Goal status: New   5.  Patient will demonstrate right [place] joint AROM WFL s symptoms to facilitate usual overhead reaching, self care, dressing at PLOF.    Goal status: New   6.  Pt will improve her Rt shoulder ER to >/= 70 degrees for ADL's.   Goal status: Met 02/03/2022  7. Pt will be able to lift 5 # from floor to counter height using her Rt UE.    Goal Status: New  PLAN:  PT FREQUENCY: 1-2x/week  PT DURATION: 10 weeks  PLANNED INTERVENTIONS: Therapeutic exercises, Therapeutic activity, Neuro Muscular re-education, Balance training, Gait training, Patient/Family education, Joint mobilization, Stair training, DME instructions, Dry  Needling, Electrical stimulation, Traction, Cryotherapy, vasopneumatic device Moist heat, Taping, Ultrasound, Ionotophoresis '4mg'$ /ml Dexamethasone, and Manual therapy.  All included unless contraindicated  PLAN FOR NEXT SESSION: Shoulder AROM/strengthening, manual therapy as needed, pt requesting no UBE as seat doesn't fit her well, consider a posterior capsule stretch to address 20 degrees of horizontal adduction, standing strengthening exercises    Laureen Abrahams, PT, DPT 02/15/22 3:22 PM

## 2022-02-18 ENCOUNTER — Ambulatory Visit (INDEPENDENT_AMBULATORY_CARE_PROVIDER_SITE_OTHER): Payer: Medicare HMO

## 2022-02-18 ENCOUNTER — Encounter: Payer: Medicare HMO | Admitting: Physical Therapy

## 2022-02-18 ENCOUNTER — Ambulatory Visit: Payer: Self-pay

## 2022-02-18 ENCOUNTER — Encounter: Payer: Self-pay | Admitting: Sports Medicine

## 2022-02-18 ENCOUNTER — Ambulatory Visit (INDEPENDENT_AMBULATORY_CARE_PROVIDER_SITE_OTHER): Payer: Medicare HMO | Admitting: Sports Medicine

## 2022-02-18 DIAGNOSIS — M25551 Pain in right hip: Secondary | ICD-10-CM

## 2022-02-18 MED ORDER — METHYLPREDNISOLONE ACETATE 40 MG/ML IJ SUSP
80.0000 mg | INTRAMUSCULAR | Status: AC | PRN
  Administered 2022-02-18: 80 mg via INTRA_ARTICULAR

## 2022-02-18 NOTE — Progress Notes (Signed)
[name] - 67 y.o. female MRN [id number]  Date of birth: 12-07-1955  Office Visit Note: Visit Date: 02/18/2022 PCP: Janith Lima, MD Referred by: Janith Lima, MD  Subjective: Chief Complaint  Patient presents with   Right Hip - Pain   HPI: [name] is a pleasant 67 y.o. female who presents today for right hip pain.  She has had right hip pain for about 2 months.  Denies any specific injury, insidious onset.  Pain is more so over the anterior hip and will radiate into the groin at times.  She has pain with sitting for long periods of time.  Denies any redness, swelling.  No previous hip injury or surgeries.  Pertinent ROS were reviewed with the patient and found to be negative unless otherwise specified above in HPI.   Assessment & Plan: Visit Diagnoses:  1. Pain of right hip    Plan: Discussed with Kaneisha and her right hip pain and did review her x-rays with her today which fortunately did not show any significant osteoarthritis of the right hip.  She does have a very small bony spur that could potentially be causing some aggravation or impingement.  Through shared decision-making with the patient, we did proceed with an ultrasound-guided intra-articular hip injection.  No anesthetic was used for this given her allergy.  She may use ice and over-the-counter Tylenol for any postinjection pain.  She will monitor how her pain improves over the coming days to weeks.  She will follow-up in 2 weeks with Dr. Ninfa Linden.  If her pain is markedly improved, this is likely coming from within the hip joint, if not may turn our attention to her anterior hip flexors.  Follow-up: Return in about 2 weeks (around 03/04/2022) for f/u with Dr. Ninfa Linden for right hip in 2 weeks.   Meds & Orders: No orders of the defined types were placed in this encounter.   Orders Placed This Encounter  Procedures   Large Joint Inj   XR HIP UNILAT W OR W/O PELVIS 2-3 VIEWS RIGHT   US Guided Needle  Placement - No Linked Charges     Procedures: Large Joint Inj: R hip joint on 02/18/2022 10:43 AM Indications: pain and diagnostic evaluation Details: 22 G 3.5 in needle, ultrasound-guided anterior approach Medications: 80 mg methylPREDNISolone acetate 40 MG/ML Outcome: tolerated well, no immediate complications  Procedure: US-guided intra-articular hip injection, right After discussion on risks/benefits/indications and informed verbal consent was obtained, a timeout was performed. Patient was lying supine on exam table. The hip was cleaned with betadine and alcohol swabs. Then utilizing ultrasound guidance, the patient's femoral head and neck junction was identified and subsequently injected with 1:2 dextrose 5% :depomedrol via an in-plane approach with ultrasound visualization of the injectate administered into the hip joint. Patient tolerated procedure well without immediate complications.  No anesthetic agent was used 2/2 to her allergy.  Procedure, treatment alternatives, risks and benefits explained, specific risks discussed. Consent was given by the patient. Immediately prior to procedure a time out was called to verify the correct patient, procedure, equipment, support staff and site/side marked as required. Patient was prepped and draped in the usual sterile fashion.          Clinical History: No specialty comments available.  She reports that she has never smoked. She has never used smokeless tobacco.  Recent Labs    03/09/21 1449 08/24/21 1111  HGBA1C 6.5 5.6    Objective:   Vital Signs:  LMP 03/05/2012 (Approximate)   Physical Exam  Gen: Well-appearing, in no acute distress; non-toxic CV:  Well-perfused. Warm.  Resp: Breathing unlabored on room air; no wheezing. Psych: Fluid speech in conversation; appropriate affect; normal thought process Neuro: Sensation intact throughout. No gross coordination deficits.   Ortho Exam - Right hip: There is some mild TTP over the  anterior aspect of the hip joint without any surrounding skin changes, swelling or redness.  Negative logroll with fluid range of motion with passive internal and external rotation.  There is some pain at endrange hip flexion and positive FADIR test. + Stinchfield test.  5/5 strength of the right lower extremity.  Neurovascular intact distally.  Imaging: XR HIP UNILAT W OR W/O PELVIS 2-3 VIEWS RIGHT  Result Date: 02/18/2022 2 views of the right hip including AP and lateral femoral ordered and reviewed by myself.  X-rays do show a small spur off the superior lateral aspect of the femoral head.  There is no significant arthritis within the hip joint.  Mild SI joint sclerosis.  No acute fracture or otherwise bony abnormality noted.   Past Medical/Family/Surgical/Social History: Medications & Allergies reviewed per EMR, new medications updated. 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[place]) 07/27/2019   Osteoarthritis of glenohumeral joint 03/26/2019   Iron deficiency anemia 04/28/2018   OA (osteoarthritis) of knee 12/26/2017   PVC (premature ventricular contraction) 11/21/2017   Dysphonia 09/26/2017   Vitamin D deficiency, unspecified 10/13/2016   Primary osteoarthritis of left hand 06/30/2016   Osteoporosis without current pathological fracture 03/22/2016   Paraesophageal hiatal hernia s/p robotic repair & fundoplication 2/83/1517 61/60/7371   Bipolar 1 disorder, mixed, moderate ([place]) 01/29/2016   Class 1 obesity with body mass index (BMI) of 32.0 to 32.9 in adult 01/06/2016   Migraines 11/21/2014   OSA on CPAP 01/30/2007   GERD 11/02/2006   DERMATITIS, HANDS  11/02/2006   Hypothyroidism 10/07/2006   Bipolar affective ([place]) 10/07/2006   Essential hypertension 10/07/2006   RHINITIS, CHRONIC 10/07/2006   Seasonal and perennial allergic rhinitis 10/07/2006   Moderate persistent asthma, uncomplicated 08/04/9483   Irritable bowel syndrome 10/07/2006   INTERSTITIAL CYSTITIS 10/07/2006   ROSACEA 10/07/2006   Osteoarthritis of both knees 10/07/2006   Disturbance in sleep behavior 10/07/2006   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   Hypothyroidism    Intermittent palpitations    cardiology--  dr [place]   Interstitial cystitis    Limited jaw range of motion    s/p  bilateral TMJ surgery,  age 22s  Migraines    Moderate persistent asthma    pulmologist-- dr Halford Chessman   OA (osteoarthritis)    both knees   OSA on CPAP    per last sleep study 09/ 2017  mild OSA, AHI13/hr   PONV (postoperative nausea and vomiting)    PVC (premature ventricular contraction)    Rosacea    Sensation of pressure in bladder area    per pt intermittant   TMJ arthralgia    Urticaria    Wears glasses    White coat syndrome without hypertension    05-25-2017  per pt hx hypertension yrs ago, no issues after quitting stressful job   Family History  Problem Relation Age of Onset   Hypertension Mother        deceased from MVA complications   Thyroid disease Mother    Allergic rhinitis Mother    Testicular cancer Father    Allergic rhinitis Father    Colon  cancer Sister    Colon polyps Sister    Healthy Sister    Healthy Brother    Coronary artery disease Other    Stomach cancer Neg Hx    Past Surgical History:  Procedure Laterality Date   6 HOUR [place] STUDY N/A 09/22/2015   Procedure: 24 HOUR [place] STUDY;  Surgeon: Doran Stabler, MD;  [facility];  Service: Gastroenterology;  Laterality: N/A;   ADENOIDECTOMY     ANAL FISSURE REPAIR  age 17  (49)   BREAST EXCISIONAL BIOPSY Right 04-16-2003  dr p. young  [facility]   benign   BUNIONECTOMY Right early 2000s   CARPAL TUNNEL RELEASE Left age 79 (79)   CHOLECYSTECTOMY OPEN  age 46   CYSTO WITH HYDRODISTENSION N/A 06/02/2017   Procedure: CYSTOSCOPY/HYDRODISTENSION OF BLADDER;  Surgeon: Irine Seal, MD;  Location: [facility];  Service: Urology;  Laterality: N/A;   CYSTO/ HYDRODISTENTION/  INSTILLATION THERAPY  1990s   DX LAPAROSCOPY/  DX HYSTEROSCOPY/  D & C  08-07-2007   dr dove  [place]  10-21-2008   dr Harolyn Rutherford  [facility]   ESOPHAGEAL MANOMETRY N/A 09/22/2015   Procedure: ESOPHAGEAL MANOMETRY (EM);  Surgeon: Doran Stabler, MD;  [facility];  Service: Gastroenterology;  Laterality: N/A;  with impedence    FINGER ARTHROPLASTY Bilateral    left little finger 2016:  right middle finger 06/ 2018   KNEE ARTHROSCOPY Bilateral x3 left ;  x3  right-- last one age 45 (43)   NASAL SEPTUM SURGERY  age 66s   ROBOT ASSISTED REDUCTION PARAESOPHAGEAL HIATAL HERNIA/ TYPE 2 MEDIASTINAL DISSECTION/ PRIMARY HIATAL HERNIA REPAIR/ ANTERIOR & POSTERIOR GASTROPEXY/ NISSEN FUNDOPLICATION  [id number]   DR GROSS  [facility]   SINOSCOPY     TEMPOROMANDIBULAR JOINT SURGERY Bilateral x3  -- age 71s   per pt used graft   TONSILLECTOMY     TOTAL KNEE ARTHROPLASTY Left 12/26/2017   Procedure: LEFT TOTAL KNEE ARTHROPLASTY;  Surgeon: Gaynelle Arabian, MD;  Location: [facility];  Service: Orthopedics;  Laterality: Left;  12mn   TRANSTHORACIC ECHOCARDIOGRAM  12-06-2017   dr  [place]ique  ef 55-60%, grade 1 diastoilc dysfunction, trivial MR   TRIGGER FINGER RELEASE Bilateral last one 2017   several release's bilaterally   ULNAR NERVE TRANSPOSITION Bilateral age 67((66   Social History   Occupational History   Occupation: disability  Tobacco Use   Smoking status: Never   Smokeless tobacco: Never  Vaping Use  Vaping Use: Never used  Substance and Sexual Activity   Alcohol use: Yes    Alcohol/week: 0.0 standard drinks of alcohol    Comment: once every 3-4 months   Drug use: No   Sexual activity: Not Currently    Partners: Male    Birth control/protection: Post-menopausal

## 2022-02-22 ENCOUNTER — Ambulatory Visit: Payer: Medicare HMO | Admitting: Physical Therapy

## 2022-02-22 ENCOUNTER — Encounter: Payer: Self-pay | Admitting: Physical Therapy

## 2022-02-22 DIAGNOSIS — M25511 Pain in right shoulder: Secondary | ICD-10-CM | POA: Diagnosis not present

## 2022-02-22 DIAGNOSIS — M6281 Muscle weakness (generalized): Secondary | ICD-10-CM | POA: Diagnosis not present

## 2022-02-22 DIAGNOSIS — R600 Localized edema: Secondary | ICD-10-CM | POA: Diagnosis not present

## 2022-02-22 DIAGNOSIS — M25611 Stiffness of right shoulder, not elsewhere classified: Secondary | ICD-10-CM

## 2022-02-22 NOTE — Therapy (Signed)
OUTPATIENT PHYSICAL THERAPY SHOULDER TREATMENT   Patient Name: [name] MRN: [id number] DOB:[birth year], 67 y.o., female Today's Date: 02/22/2022  END OF SESSION:  PT End of Session - 02/22/22 1017     Visit Number 7    Number of Visits 20    Date for PT Re-Evaluation 03/26/22    Progress Note Due on Visit 10    PT Start Time 1015    PT Stop Time 1055    PT Time Calculation (min) 40 min    Activity Tolerance Patient tolerated treatment well;No increased pain    Behavior During Therapy WFL for tasks assessed/performed                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   Hypothyroidism    Intermittent palpitations    cardiology--  dr [place]   Interstitial cystitis    Limited jaw range of motion    s/p  bilateral TMJ surgery,  age 39s   Migraines    Moderate persistent asthma    pulmologist-- dr Halford Chessman   OA (osteoarthritis)    both knees   OSA on CPAP    per last sleep study 09/ 2017  mild OSA, AHI13/hr   PONV (postoperative nausea and vomiting)    PVC (premature ventricular contraction)    Rosacea    Sensation of pressure in bladder area    per pt intermittant   TMJ arthralgia    Urticaria    Wears glasses    White coat syndrome without hypertension    05-25-2017  per pt hx hypertension yrs ago, no issues after quitting stressful job   Past Surgical History:  Procedure Laterality Date   24 HOUR [place] STUDY N/A 09/22/2015   Procedure: 24  HOUR [place] STUDY;  Surgeon: Doran Stabler, MD;  Location: [facility];  Service: Gastroenterology;  Laterality: N/A;   ADENOIDECTOMY     ANAL FISSURE REPAIR  age 89  (63)   BREAST EXCISIONAL BIOPSY Right 04-16-2003  dr p. young  [facility]   benign   BUNIONECTOMY Right early 2000s   CARPAL TUNNEL RELEASE Left age 12 (89)   CHOLECYSTECTOMY OPEN  age 69   CYSTO WITH HYDRODISTENSION N/A 06/02/2017   Procedure: CYSTOSCOPY/HYDRODISTENSION OF BLADDER;  Surgeon: Irine Seal, MD;  Location: [facility];  Service: Urology;  Laterality: N/A;   CYSTO/ HYDRODISTENTION/  INSTILLATION THERAPY  1990s   DX LAPAROSCOPY/  DX HYSTEROSCOPY/  D & C  08-07-2007   dr dove  [place]  10-21-2008   dr Harolyn Rutherford  [facility]   ESOPHAGEAL MANOMETRY N/A 09/22/2015   Procedure: ESOPHAGEAL MANOMETRY (EM);  Surgeon: Doran Stabler, MD;  Location: [facility];  Service: Gastroenterology;  Laterality: N/A;  with impedence    FINGER ARTHROPLASTY Bilateral  left little finger 2016:  right middle finger 06/ 2018   KNEE ARTHROSCOPY Bilateral x3 left ;  x3  right-- last one age 40 (75)   NASAL SEPTUM SURGERY  age 42s   ROBOT ASSISTED REDUCTION PARAESOPHAGEAL HIATAL HERNIA/ TYPE 2 MEDIASTINAL DISSECTION/ PRIMARY HIATAL HERNIA REPAIR/ ANTERIOR & POSTERIOR GASTROPEXY/ NISSEN FUNDOPLICATION  14-48-1856   DR GROSS  [facility]   SINOSCOPY     TEMPOROMANDIBULAR JOINT SURGERY Bilateral x3  -- age 72s   per pt used graft   TONSILLECTOMY     TOTAL KNEE ARTHROPLASTY Left 12/26/2017   Procedure: LEFT TOTAL KNEE ARTHROPLASTY;  Surgeon: Gaynelle Arabian, MD;  Location: [facility];  Service: Orthopedics;  Laterality: Left;  36mn   TRANSTHORACIC ECHOCARDIOGRAM  12-06-2017   dr jMartinique  ef 55-60%, grade 1 diastoilc dysfunction, trivial MR   TRIGGER FINGER RELEASE Bilateral last one 2017   several release's bilaterally   ULNAR NERVE TRANSPOSITION Bilateral age 67(1980)   Patient Active Problem List   Diagnosis  Date Noted   Hyperlipidemia LDL goal <100 08/24/2021   Encounter for general adult medical examination with abnormal findings 03/14/2021   Estrogen deficiency 03/09/2021   Cervical cancer screening 03/09/2021   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   Grade I diastolic dysfunction 031/49/7026  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[place] 07/27/2019   Osteoarthritis of glenohumeral joint 03/26/2019   Iron deficiency anemia 04/28/2018   OA (osteoarthritis) of knee 12/26/2017   PVC (premature ventricular contraction) 11/21/2017   Dysphonia 09/26/2017   Vitamin D deficiency, unspecified 10/13/2016   Primary osteoarthritis of left hand 06/30/2016   Osteoporosis without current pathological fracture 03/22/2016   Paraesophageal hiatal hernia s/p robotic repair & fundoplication 13/78/5885002/77/4128  Bipolar 1 disorder, mixed, moderate ([place] 01/29/2016   Class 1 obesity with body mass index (BMI) of 32.0 to 32.9 in adult 01/06/2016   Migraines 11/21/2014   OSA on CPAP 01/30/2007   GERD 11/02/2006   DERMATITIS, HANDS 11/02/2006   Hypothyroidism 10/07/2006   Bipolar affective ([place] 10/07/2006   Essential hypertension 10/07/2006   RHINITIS, CHRONIC 10/07/2006   Seasonal and perennial allergic rhinitis 10/07/2006   Moderate persistent asthma, uncomplicated 078/67/6720  Irritable bowel syndrome 10/07/2006   INTERSTITIAL CYSTITIS 10/07/2006   ROSACEA 10/07/2006   Osteoarthritis of both knees 10/07/2006   Disturbance in sleep behavior 10/07/2006    PCP:  JJanith Lima MD   REFERRING PROVIDER: BMcarthur Rossetti MD  REFERRING DIAG: M75.41 (ICD-10-CM) - Impingement syndrome of right shoulder  THERAPY DIAG:  Acute pain of right shoulder  Stiffness of right shoulder, not elsewhere classified  Localized edema  Muscle weakness (generalized)  Rationale for Evaluation and Treatment: Rehabilitation  ONSET DATE: 12/24/21  SUBJECTIVE:  SUBJECTIVE STATEMENT: Didn't sleep well last night, unrelated to shoulder.  Reports "a little" pain.   Pain with opening food boxes still reported at this time  PERTINENT HISTORY: Rt shoulder arthroscopy 12/24/21 DM2, anxiety, bipolar, depression, eczema, h/o melanoma 2000's, OA, migraines, white coat symdrome without HTN, TKA-left 2019, knee arthroscopy bil 1999,   PAIN:  NPRS scale: 1/10 this week Pain location: Rt shoulder Pain description: achy, sore Aggravating factors: sleeping on the right side Relieving factors: ice, pt stating she has pain meds but hasn't been using them  PRECAUTIONS: no lifting over 5 pounds, shoulder arthroscopy on 12/24/21  WEIGHT BEARING RESTRICTIONS: No  FALLS:  Has patient fallen in last 6 months? Yes, pt stated she was pushing a recycle can down the driveway and she fell down when the can got out of control in March of 2023.   LIVING ENVIRONMENT: Lives with: lives with their family and lives with their spouse Lives in: House/apartment Stairs: Yes: External: 3 steps; none Has following equipment at home: None  OCCUPATION: On disability, pt volunteers at church  PLOF: [place] hurting, reach the back of my head, donning clothes without pain or difficutly    OBJECTIVE:   PATIENT SURVEYS:  01/12/22: FOTO intake:   48% 02/15/22: FOTO 55  POSTURE: 01/12/22: rounded shoulders, forward head   UPPER EXTREMITY ROM:   ROM Right 01/12/22 Left 01/12/22 Right 01/25/22 Right 02/03/2022 Right 02/22/22  Shoulder flexion 138 155 140  (Siting) 150 141 (Standing)  Shoulder extension 30 40     Shoulder abduction 120 160  114 (Sitting)  140 (Standing)  Shoulder horizontal adduction    20   Shoulder internal rotation 55 62  25 FIR: T8/9  Shoulder external rotation 60 70  90 85  (standing)  Elbow flexion 150 152     Elbow extension 0 0     (Blank rows = not tested)  UPPER EXTREMITY MMT:  MMT Right 01/12/22 Left 01/12/22  Shoulder flexion 3/5 4/5  Shoulder extension 3/5 4/5  Shoulder abduction    Shoulder adduction    Shoulder internal rotation 3/5 4/5  Shoulder external rotation 3/5 4/5  (Blank rows = not tested)   PALPATION:  TTP: Right anterior, lateral and posterior.    TODAY'S TREATMENT:                                                                                                                           DATE: 02/22/22 TherEx: Rt row with palm down 2x10; 3 sec hold; L3 band Bent over row on Rt with L3 band; 3 sec hold; 2x10 Palm down with low anchor row; L3 band; 2x10 Overhead press 2x10 bil; 2# DB Standing abduction with palms down bil 2#; 2x10 Standing flexion with palms down bil 2# 2x10   02/15/22 TherEx: Supine shoulder flexion with 2# bar AA 2x10; 3 sec hold Supine shoulder flexion on Rt 3# 2x10 Sidelying ER on Rt 3# 2x10 Sidelying Rt  abduction to 90 deg 3# 2x10 Rows L4 band 2x10 Seated internal rotation stretch with strap 5x5 sec hold (modified HEP to this exercise as pt prefers this one) Bicep curl to overhead press 2# bil 2x10  02/03/22 Supine arm raises 2 sets of 20 for 3 seconds (2# and 3#) Supine IR stretch 10X 10 seconds Thumb up the back 10X 10 seconds Pull to chest/Rows Green 20X 3 seconds Shoulder IR Green theraband 2 sets of 10 slow eccentrics  Shoulder ER Green theraband 2 sets of 10 slow eccentrics   01/29/22 TherEx: Rows L3 band 2 x 10 Rt IR with L3 band 2 x 10 Rt ER with L3 band 2 x 10  Rt shoulder flexion to 90 deg 2x10; 1# standing Rt shoulder abduction to 90 deg 2x10; 1# standing Standing AA flexion x 20 reps; 1# bar Standing AA Rt abduction x 20  reps; 1# bar; active eccentric on Rt Wall ladder with 1# wrist weight flexion and scaption x 10 each  PATIENT EDUCATION: Education details: HEP, POC Person educated: Patient Education method: Consulting civil engineer, Demonstration, Verbal cues, and Handouts Education comprehension: verbalized understanding, returned demonstration, and verbal cues required  HOME EXERCISE PROGRAM: Access Code: [email or web address] URL: [email or web address] Date: 02/22/2022 Prepared by: Faustino Congress  Exercises - Supine Shoulder Flexion Extension AAROM with Dow[place]  - 2-3 x daily - 7 x weekly - 10 reps - Supine Shoulder External Rotation in 45 Degrees Abduction AAROM with Dowel  - 2-3 x daily - 7 x weekly - 10 reps - Standing Row with Anchored Resistance  - 2-3 x daily - 7 x weekly - 2 sets - 10 reps - Seated Shoulder Flexion Towel Slide at Table Top Full Range of Motion  - 2-3 x daily - 7 x weekly - 10 reps - 5 seconds hold - Seated Shoulder External Rotation PROM on Table  - 2-3 x daily - 7 x weekly - 10 reps - 5 seconds hold - Shoulder External Rotation with Anchored Resistance  - 2 x daily - 7 x weekly - 2 sets - 10 reps - Shoulder extension with resistance - Neutral  - 2 x daily - 7 x weekly - 2 sets - 10 reps - Shoulder Internal Rotation with Resistance  - 2 x daily - 7 x weekly - 2 sets - 10 reps - Supine Scapular Protraction in Flexion with Dumbbells  - 1 x daily - 7 x weekly - 2 sets - 20 reps - 3 seconds hold - Supine Shoulder Internal Rotation Stretch  - 2 x daily - 7 x weekly - 1 sets - 20 reps - 10 seconds hold - Standing Shoulder Internal Rotation Stretch with Towel  - 2 x daily - 7 x weekly - 1 sets - 10 reps - 10 sec hold - Standing Bent Over Shoulder Row with Resistance  - 1 x daily - 7 x weekly - 2 sets - 10 reps - Standing Single Arm Low Row with Anchored Resistance  - 1 x daily - 7 x weekly - 2 sets - 10 reps  ASSESSMENT:  CLINICAL IMPRESSION: Session today focused on exercises to help  with functional tasks she has difficulty with, expressing this as opening cracker or soda boxes.  Will continue to benefit from PT to maximize function.  AROM WFL at this time. Pt has demonstrated improvement in FOTO score at this time, and is progressing well with PT.  Still having some tightness and Rt shoulder weakness, and will continue to benefit  from PT to maximize function.   OBJECTIVE IMPAIRMENTS: decreased mobility, decreased ROM, decreased strength, increased edema, impaired UE functional use, and pain.   ACTIVITY LIMITATIONS: carrying, lifting, sleeping, bathing, dressing, and reach over head  PARTICIPATION LIMITATIONS: community activity  PERSONAL FACTORS:  see pertinent history above  are also affecting patient's functional outcome.   REHAB POTENTIAL: Good  CLINICAL DECISION MAKING: Stable/uncomplicated  EVALUATION COMPLEXITY: Moderate   GOALS: Goals reviewed with patient? Yes  SHORT TERM GOALS: (target date for Short term goals are 3 weeks 02/05/22)   1.Patient will demonstrate independent use of home exercise program to maintain progress from in clinic treatments. Goal status: On Going 02/03/2022  LONG TERM GOALS: (target dates for all long term goals are 10 weeks  03/26/2022)    1. Patient will demonstrate/report pain at worst less than or equal to 2/10 to facilitate minimal limitation in daily activity secondary to pain symptoms. Goal status: On Going 02/03/2022   2. Patient will demonstrate independent use of home exercise program to facilitate ability to maintain/progress functional gains from skilled physical therapy services. Goal status: On Going 02/03/2022   3. Patient will demonstrate FOTO outcome > or = 61 % to indicate reduced disability due to condition. Goal status: New   4.  Patient will demonstrate right  UE MMT 5/5 throughout to facilitate lifting, reaching, carrying at [facility] in daily activity.   Goal status: New   5.  Patient will demonstrate  right [place] joint AROM WFL s symptoms to facilitate usual overhead reaching, self care, dressing at PLOF.    Goal status: New   6.  Pt will improve her Rt shoulder ER to >/= 70 degrees for ADL's.   Goal status: Met 02/03/2022  7. Pt will be able to lift 5 # from floor to counter height using her Rt UE.    Goal Status: New  PLAN:  PT FREQUENCY: 1-2x/week  PT DURATION: 10 weeks  PLANNED INTERVENTIONS: Therapeutic exercises, Therapeutic activity, Neuro Muscular re-education, Balance training, Gait training, Patient/Family education, Joint mobilization, Stair training, DME instructions, Dry Needling, Electrical stimulation, Traction, Cryotherapy, vasopneumatic device Moist heat, Taping, Ultrasound, Ionotophoresis '4mg'$ /ml Dexamethasone, and Manual therapy.  All included unless contraindicated  PLAN FOR NEXT SESSION: see how new exercises are going; continue functional tasks; Shoulder AROM/strengthening, manual therapy as needed, pt requesting no UBE as seat doesn't fit her well, consider a posterior capsule stretch to address 20 degrees of horizontal adduction, standing strengthening exercises    Laureen Abrahams, PT, DPT 02/22/22 11:03 AM

## 2022-02-24 DIAGNOSIS — R69 Illness, unspecified: Secondary | ICD-10-CM | POA: Diagnosis not present

## 2022-02-24 DIAGNOSIS — F410 Panic disorder [episodic paroxysmal anxiety] without agoraphobia: Secondary | ICD-10-CM | POA: Diagnosis not present

## 2022-02-24 DIAGNOSIS — F3132 Bipolar disorder, current episode depressed, moderate: Secondary | ICD-10-CM | POA: Diagnosis not present

## 2022-02-24 DIAGNOSIS — F411 Generalized anxiety disorder: Secondary | ICD-10-CM | POA: Diagnosis not present

## 2022-02-25 ENCOUNTER — Encounter: Payer: Medicare HMO | Admitting: Rehabilitative and Restorative Service Providers"

## 2022-02-25 ENCOUNTER — Telehealth: Payer: Self-pay

## 2022-02-25 DIAGNOSIS — Z79899 Other long term (current) drug therapy: Secondary | ICD-10-CM | POA: Diagnosis not present

## 2022-02-25 NOTE — Telephone Encounter (Signed)
Per CoverMyMeds:  The patient currently has access to the requested medication and a Prior Authorization is not needed for the patient/medication.

## 2022-02-25 NOTE — Telephone Encounter (Signed)
Key: [id number]

## 2022-02-26 ENCOUNTER — Other Ambulatory Visit: Payer: Self-pay | Admitting: Allergy & Immunology

## 2022-02-26 ENCOUNTER — Other Ambulatory Visit: Payer: Self-pay | Admitting: Internal Medicine

## 2022-02-26 DIAGNOSIS — G43109 Migraine with aura, not intractable, without status migrainosus: Secondary | ICD-10-CM

## 2022-02-26 DIAGNOSIS — E785 Hyperlipidemia, unspecified: Secondary | ICD-10-CM

## 2022-02-26 DIAGNOSIS — E876 Hypokalemia: Secondary | ICD-10-CM

## 2022-02-28 ENCOUNTER — Ambulatory Visit (HOSPITAL_BASED_OUTPATIENT_CLINIC_OR_DEPARTMENT_OTHER): Payer: Medicare HMO | Attending: Pulmonary Disease | Admitting: Pulmonary Disease

## 2022-02-28 DIAGNOSIS — I10 Essential (primary) hypertension: Secondary | ICD-10-CM | POA: Insufficient documentation

## 2022-02-28 DIAGNOSIS — G4736 Sleep related hypoventilation in conditions classified elsewhere: Secondary | ICD-10-CM | POA: Insufficient documentation

## 2022-02-28 DIAGNOSIS — G4733 Obstructive sleep apnea (adult) (pediatric): Secondary | ICD-10-CM | POA: Insufficient documentation

## 2022-03-01 ENCOUNTER — Telehealth: Payer: Self-pay | Admitting: Pulmonary Disease

## 2022-03-01 ENCOUNTER — Encounter: Payer: Self-pay | Admitting: Physical Therapy

## 2022-03-01 ENCOUNTER — Ambulatory Visit: Payer: Medicare HMO | Admitting: Physician Assistant

## 2022-03-01 ENCOUNTER — Ambulatory Visit: Payer: Medicare HMO | Admitting: Physical Therapy

## 2022-03-01 ENCOUNTER — Telehealth (HOSPITAL_BASED_OUTPATIENT_CLINIC_OR_DEPARTMENT_OTHER): Payer: Medicare HMO | Admitting: Pulmonary Disease

## 2022-03-01 DIAGNOSIS — R600 Localized edema: Secondary | ICD-10-CM | POA: Diagnosis not present

## 2022-03-01 DIAGNOSIS — M25511 Pain in right shoulder: Secondary | ICD-10-CM

## 2022-03-01 DIAGNOSIS — M6281 Muscle weakness (generalized): Secondary | ICD-10-CM | POA: Diagnosis not present

## 2022-03-01 DIAGNOSIS — M25611 Stiffness of right shoulder, not elsewhere classified: Secondary | ICD-10-CM | POA: Diagnosis not present

## 2022-03-01 DIAGNOSIS — G4733 Obstructive sleep apnea (adult) (pediatric): Secondary | ICD-10-CM | POA: Diagnosis not present

## 2022-03-01 NOTE — Procedures (Signed)
POLYSOMNOGRAPHY  Last, First: [name], [name] MRN: [id number] Gender: Female Age (years): 80 Weight (lbs): 142 DOB: [birth year] BMI: 29 Primary Care: No PCP Epworth Score: 3 Referring: Laurin Coder MD Technician: Gwenyth Allegra Interpreting: Laurin Coder MD Study Type: NPSG Ordered Study Type: NPSG Study date: 02/28/2022 Location: [facility] CLINICAL INFORMATION [name] is a 67 year old Female and was referred to the sleep center for evaluation of G47.33 OSA: Adult and Pediatric (327.23). Indications include Hypertension.  MEDICATIONS Patient self administered medications include: Suvorexant. Medications administered during study include No sleep medicine administered.  SLEEP STUDY TECHNIQUE A multi-channel overnight Polysomnography study was performed. The channels recorded and monitored were central and occipital EEG, electrooculogram (EOG), submentalis EMG (chin), nasal and oral airflow, thoracic and abdominal wall motion, anterior tibialis EMG, snore microphone, electrocardiogram, and a pulse oximetry. TECHNICIAN COMMENTS Comments added by Technician: None Comments added by Scorer: N/A SLEEP ARCHITECTURE The study was initiated at 10:52:17 PM and terminated at 5:09:32 AM. The total recorded time was 377.2 minutes. EEG confirmed total sleep time was 288 minutes yielding a sleep efficiency of 76.3%. Sleep onset after lights out was 43.2 minutes with a REM latency of 292.0 minutes. The patient spent 7.5% of the night in stage N1 sleep, 79.3% in stage N2 sleep, 0.0% in stage N3 and 13.2% in REM. Wake after sleep onset (WASO) was 46.1 minutes. The Arousal Index was 19.4/hour. RESPIRATORY PARAMETERS There were a total of 72 respiratory disturbances out of which 9 were apneas ( 9 obstructive, 0 mixed, 0 central) and 63 hypopneas. The apnea/hypopnea index (AHI) was 15.0 events/hour. The central sleep apnea index was 0 events/hour. The REM AHI was 44.2 events/hour and NREM AHI  was 10.6 events/hour. The supine AHI was N/A events/hour and the non supine AHI was 15 supine during 0.00% of sleep. Respiratory disturbances were associated with oxygen desaturation down to a nadir of 79.0% during sleep. The mean oxygen saturation during the study was 92.7%.   LEG MOVEMENT DATA The total leg movements were 0 with a resulting leg movement index of 0.0/hr .Associated arousal with leg movement index was 0.0/hr.  CARDIAC DATA The underlying cardiac rhythm was most consistent with sinus rhythm. The mean heart rate during sleep was 71.5 bpm. Additional rhythm abnormalities include None.  IMPRESSIONS - Moderate Obstructive Sleep apnea(OSA) - EKG showed no cardiac abnormalities. - Severe Oxygen Desaturation - The patient snored with a moderate snoring volume. - No significant periodic leg movements(PLMs) during sleep. However, no significant associated arousals.  DIAGNOSIS - Moderate Obstructive Sleep Apnea (G47.33) - Nocturnal Hypoxemia (G47.36)  RECOMMENDATIONS - Auto CPAP, with settings of 5-20 with heated humidification with patients' mask of choice should be considered as an option for treatment. - Therapeutic CPAP titration to determine the optimal pressure required to alleviate sleep-disordered breathing may be considered. - Avoid alcohol, sedatives and other CNS depressants that may worsen sleep apnea and disrupt normal sleep architecture. - Sleep hygiene should be reviewed to assess factors that may improve sleep quality. - Weight management and regular exercise should be initiated or continued.  [Electronically signed] 03/01/2022 08:40 AM  Sherrilyn Rist MD NPI: [id number]

## 2022-03-01 NOTE — Telephone Encounter (Signed)
Call patient  Sleep study result  Date of study: 02/28/2022  Impression: Moderate obstructive sleep apnea Nocturnal desaturations  Recommendation:  DME referral  Recommend CPAP therapy for moderate obstructive sleep apnea  Auto titrating CPAP with pressure settings of 5-20 will be appropriate  Encourage weight loss measures  Follow-up in the office 4 to 6 weeks following initiation of treatment

## 2022-03-01 NOTE — Telephone Encounter (Signed)
Called the pt and there was no answer- LMTCB    

## 2022-03-01 NOTE — Therapy (Signed)
OUTPATIENT PHYSICAL THERAPY SHOULDER TREATMENT   Patient Name: [name] MRN: [id number] DOB:[birth year], 67 y.o., female Today's Date: 03/01/2022  END OF SESSION:  PT End of Session - 03/01/22 1149     Visit Number 8    Number of Visits 20    Date for PT Re-Evaluation 03/26/22    Progress Note Due on Visit 10    PT Start Time 1146    PT Stop Time 1225    PT Time Calculation (min) 39 min    Activity Tolerance Patient tolerated treatment well;No increased pain    Behavior During Therapy WFL for tasks assessed/performed                 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   Hypothyroidism    Intermittent palpitations    cardiology--  dr [place]   Interstitial cystitis    Limited jaw range of motion    s/p  bilateral TMJ surgery,  age 69s   Migraines    Moderate persistent asthma    pulmologist-- dr Halford Chessman   OA (osteoarthritis)    both knees   OSA on CPAP    per last sleep study 09/ 2017  mild OSA, AHI13/hr   PONV (postoperative nausea and vomiting)    PVC (premature ventricular contraction)    Rosacea    Sensation of pressure in bladder area    per pt intermittant   TMJ arthralgia    Urticaria    Wears glasses    White coat syndrome without hypertension    05-25-2017  per pt hx hypertension yrs ago, no issues after quitting stressful job   Past Surgical History:  Procedure Laterality Date   24 HOUR [place] STUDY N/A 09/22/2015   Procedure: 24  HOUR [place] STUDY;  Surgeon: Doran Stabler, MD;  Location: [facility];  Service: Gastroenterology;  Laterality: N/A;   ADENOIDECTOMY     ANAL FISSURE REPAIR  age 10  (17)   BREAST EXCISIONAL BIOPSY Right 04-16-2003  dr p. young  [facility]   benign   BUNIONECTOMY Right early 2000s   CARPAL TUNNEL RELEASE Left age 72 (30)   CHOLECYSTECTOMY OPEN  age 59   CYSTO WITH HYDRODISTENSION N/A 06/02/2017   Procedure: CYSTOSCOPY/HYDRODISTENSION OF BLADDER;  Surgeon: Irine Seal, MD;  Location: [facility];  Service: Urology;  Laterality: N/A;   CYSTO/ HYDRODISTENTION/  INSTILLATION THERAPY  1990s   DX LAPAROSCOPY/  DX HYSTEROSCOPY/  D & C  08-07-2007   dr dove  [place]  10-21-2008   dr Harolyn Rutherford  [facility]   ESOPHAGEAL MANOMETRY N/A 09/22/2015   Procedure: ESOPHAGEAL MANOMETRY (EM);  Surgeon: Doran Stabler, MD;  Location: [facility];  Service: Gastroenterology;  Laterality: N/A;  with impedence    FINGER ARTHROPLASTY  Bilateral    left little finger 2016:  right middle finger 06/ 2018   KNEE ARTHROSCOPY Bilateral x3 left ;  x3  right-- last one age 39 (53)   NASAL SEPTUM SURGERY  age 8s   ROBOT ASSISTED REDUCTION PARAESOPHAGEAL HIATAL HERNIA/ TYPE 2 MEDIASTINAL DISSECTION/ PRIMARY HIATAL HERNIA REPAIR/ ANTERIOR & POSTERIOR GASTROPEXY/ NISSEN FUNDOPLICATION  [id number]   DR GROSS  [facility]   SINOSCOPY     TEMPOROMANDIBULAR JOINT SURGERY Bilateral x3  -- age 64s   per pt used graft   TONSILLECTOMY     TOTAL KNEE ARTHROPLASTY Left 12/26/2017   Procedure: LEFT TOTAL KNEE ARTHROPLASTY;  Surgeon: Gaynelle Arabian, MD;  Location: [facility];  Service: Orthopedics;  Laterality: Left;  67mn   TRANSTHORACIC ECHOCARDIOGRAM  12-06-2017   dr jMartinique  ef 55-60%, grade 1 diastoilc dysfunction, trivial MR   TRIGGER FINGER RELEASE Bilateral last one 2017   several release's bilaterally   ULNAR NERVE TRANSPOSITION Bilateral age 67(1980)   Patient Active Problem List   Diagnosis  Date Noted   Hyperlipidemia LDL goal <100 08/24/2021   Encounter for general adult medical examination with abnormal findings 03/14/2021   Estrogen deficiency 03/09/2021   Cervical cancer screening 03/09/2021   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   Grade I diastolic dysfunction 025/95/6387  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[place] 07/27/2019   Osteoarthritis of glenohumeral joint 03/26/2019   Iron deficiency anemia 04/28/2018   OA (osteoarthritis) of knee 12/26/2017   PVC (premature ventricular contraction) 11/21/2017   Dysphonia 09/26/2017   Vitamin D deficiency, unspecified 10/13/2016   Primary osteoarthritis of left hand 06/30/2016   Osteoporosis without current pathological fracture 03/22/2016   Paraesophageal hiatal hernia s/p robotic repair & fundoplication [id number]/64/[id number]/4166  Bipolar 1 disorder, mixed, moderate ([place] 01/29/2016   Class 1 obesity with body mass index (BMI) of 32.0 to 32.9 in adult 01/06/2016   Migraines 11/21/2014   OSA on CPAP 01/30/2007   GERD 11/02/2006   DERMATITIS, HANDS 11/02/2006   Hypothyroidism 10/07/2006   Bipolar affective ([place] 10/07/2006   Essential hypertension 10/07/2006   RHINITIS, CHRONIC 10/07/2006   Seasonal and perennial allergic rhinitis 10/07/2006   Moderate persistent asthma, uncomplicated 006/30/1601  Irritable bowel syndrome 10/07/2006   INTERSTITIAL CYSTITIS 10/07/2006   ROSACEA 10/07/2006   Osteoarthritis of both knees 10/07/2006   Disturbance in sleep behavior 10/07/2006    PCP:  JJanith Lima MD   REFERRING PROVIDER: BMcarthur Rossetti MD  REFERRING DIAG: M75.41 (ICD-10-CM) - Impingement syndrome of right shoulder  THERAPY DIAG:  Acute pain of right shoulder  Stiffness of right shoulder, not elsewhere classified  Localized edema  Muscle weakness (generalized)  Rationale for Evaluation and Treatment: Rehabilitation  ONSET DATE: 12/24/21  SUBJECTIVE:  SUBJECTIVE STATEMENT: Had a sleep study last night and almost canceled this morning; has a big headache today.  Wants to try to work through.   PERTINENT HISTORY: Rt shoulder arthroscopy 12/24/21 DM2, anxiety, bipolar, depression, eczema, h/o melanoma 2000's, OA, migraines, white coat symdrome without HTN, TKA-left 2019, knee arthroscopy bil 1999,   PAIN:  NPRS scale: 1/10 this week Pain location: Rt shoulder Pain description: achy, sore Aggravating factors: sleeping on the right side Relieving factors: ice, pt stating she has pain meds but hasn't been using them  PRECAUTIONS: no lifting over 5 pounds, shoulder arthroscopy on 12/24/21  WEIGHT BEARING RESTRICTIONS: No  FALLS:  Has patient fallen in last 6 months? Yes, pt stated she was pushing a recycle can down the driveway and she fell down when the can got out of control in March of 2023.   LIVING ENVIRONMENT: Lives with: lives with their family and lives with their spouse Lives in: House/apartment Stairs: Yes: External: 3 steps; none Has following equipment at home: None  OCCUPATION: On disability, pt volunteers at church  PLOF: [place] hurting, reach the back of my head, donning clothes without pain or difficutly    OBJECTIVE:   PATIENT SURVEYS:  01/12/22: FOTO intake:   48% 02/15/22: FOTO 55  POSTURE: 01/12/22: rounded shoulders, forward head   UPPER EXTREMITY ROM:   (Blank rows = not tested)  UPPER EXTREMITY MMT:  MMT Right 01/12/22 Left 01/12/22 Right 03/01/22 Left 03/01/21  Shoulder flexion 3/5 4/5 5/5   Shoulder extension 3/5 4/5 5/5   Shoulder abduction      Shoulder  adduction      Shoulder internal rotation 3/5 4/5 5/5   Shoulder external rotation 3/5 4/5 5/5   Grip Strength   31.5# 39.4#  (Blank rows = not tested)   PALPATION:  TTP: Right anterior, lateral and posterior.    TODAY'S TREATMENT:                                                                                                                           DATE: 03/01/22: TherEx: Rows 2x10; 1st set neutral wrist, 2nd set pronated; 3 sec hold; L4 band Low rows in pronation 2x10; L4 band MMT assessments - see above with review of metrics and overall progress Provided L1 putty and demonstrated exercises for grip strength and pinch strength   02/22/22 TherEx: Rt row with palm down 2x10; 3 sec hold; L3 band Bent over row on Rt with L3 band; 3 sec hold; 2x10 Palm down with low anchor row; L3 band; 2x10 Overhead press 2x10 bil; 2# DB Standing abduction with palms down bil 2#; 2x10 Standing flexion with palms down bil 2# 2x10   02/15/22 TherEx: Supine shoulder flexion with 2# bar AA 2x10; 3 sec hold Supine shoulder flexion on Rt 3# 2x10 Sidelying ER on Rt 3# 2x10 Sidelying Rt abduction to 90 deg 3# 2x10 Rows L4 band 2x10 Seated internal rotation stretch with strap  5x5 sec hold (modified HEP to this exercise as pt prefers this one) Bicep curl to overhead press 2# bil 2x10  02/03/22 Supine arm raises 2 sets of 20 for 3 seconds (2# and 3#) Supine IR stretch 10X 10 seconds Thumb up the back 10X 10 seconds Pull to chest/Rows Green 20X 3 seconds Shoulder IR Green theraband 2 sets of 10 slow eccentrics  Shoulder ER Green theraband 2 sets of 10 slow eccentrics  PATIENT EDUCATION: Education details: HEP, POC Person educated: Patient Education method: Explanation, Demonstration, Verbal cues, and Handouts Education comprehension: verbalized understanding, returned demonstration, and verbal cues required  HOME EXERCISE PROGRAM: Access Code: [email or web address] URL:  [email or web address] Date: 02/22/2022 Prepared by: Faustino Congress  Exercises - Supine Shoulder Flexion Extension AAROM with Dowel  - 2-3 x daily - 7 x weekly - 10 reps - Supine Shoulder External Rotation in 45 Degrees Abduction AAROM with Dowel  - 2-3 x daily - 7 x weekly - 10 reps - Standing Row with Anchored Resistance  - 2-3 x daily - 7 x weekly - 2 sets - 10 reps - Seated Shoulder Flexion Towel Slide at Table Top Full Range of Motion  - 2-3 x daily - 7 x weekly - 10 reps - 5 seconds hold - Seated Shoulder External Rotation PROM on Table  - 2-3 x daily - 7 x weekly - 10 reps - 5 seconds hold - Shoulder External Rotation with Anchored Resistance  - 2 x daily - 7 x weekly - 2 sets - 10 reps - Shoulder extension with resistance - Neutral  - 2 x daily - 7 x weekly - 2 sets - 10 reps - Shoulder Internal Rotation with Resistance  - 2 x daily - 7 x weekly - 2 sets - 10 reps - Supine Scapular Protraction in Flexion with Dumbbells  - 1 x daily - 7 x weekly - 2 sets - 20 reps - 3 seconds hold - Supine Shoulder Internal Rotation Stretch  - 2 x daily - 7 x weekly - 1 sets - 20 reps - 10 seconds hold - Standing Shoulder Internal Rotation Stretch with Towel  - 2 x daily - 7 x weekly - 1 sets - 10 reps - 10 sec hold - Standing Bent Over Shoulder Row with Resistance  - 1 x daily - 7 x weekly - 2 sets - 10 reps - Standing Single Arm Low Row with Anchored Resistance  - 1 x daily - 7 x weekly - 2 sets - 10 reps  ASSESSMENT:  CLINICAL IMPRESSION: Pt has met 4 LTGs to date, and is on track to meet remaining goals.  Anticipate she will be ready for d/c next 1-2 visits.     OBJECTIVE IMPAIRMENTS: decreased mobility, decreased ROM, decreased strength, increased edema, impaired UE functional use, and pain.   ACTIVITY LIMITATIONS: carrying, lifting, sleeping, bathing, dressing, and reach over head  PARTICIPATION LIMITATIONS: community activity  PERSONAL FACTORS:  see pertinent history  above  are also affecting patient's functional outcome.   REHAB POTENTIAL: Good  CLINICAL DECISION MAKING: Stable/uncomplicated  EVALUATION COMPLEXITY: Moderate   GOALS: Goals reviewed with patient? Yes  SHORT TERM GOALS: (target date for Short term goals are 3 weeks 02/05/22)   1.Patient will demonstrate independent use of home exercise program to maintain progress from in clinic treatments. Goal status: MET 02/06/23  LONG TERM GOALS: (target dates for all long term goals are 10 weeks  03/26/2022)    1. Patient will demonstrate/report pain at  worst less than or equal to 2/10 to facilitate minimal limitation in daily activity secondary to pain symptoms. Goal status: MET 03/01/22   2. Patient will demonstrate independent use of home exercise program to facilitate ability to maintain/progress functional gains from skilled physical therapy services. Goal status: On Going 02/03/2022   3. Patient will demonstrate FOTO outcome > or = 61 % to indicate reduced disability due to condition. Goal status: New   4.  Patient will demonstrate right  UE MMT 5/5 throughout to facilitate lifting, reaching, carrying at [facility] in daily activity.  Goal status: MET 03/01/22   5.  Patient will demonstrate right Enoch joint AROM WFL s symptoms to facilitate usual overhead reaching, self care, dressing at PLOF.   Goal status: MET 03/01/22   6.  Pt will improve her Rt shoulder ER to >/= 70 degrees for ADL's.  Goal status: Met 02/03/2022  7. Pt will be able to lift 5 # from floor to counter height using her Rt UE.  Goal Status: New  PLAN:  PT FREQUENCY: 1-2x/week  PT DURATION: 10 weeks  PLANNED INTERVENTIONS: Therapeutic exercises, Therapeutic activity, Neuro Muscular re-education, Balance training, Gait training, Patient/Family education, Joint mobilization, Stair training, DME instructions, Dry Needling, Electrical stimulation, Traction, Cryotherapy, vasopneumatic device Moist heat, Taping, Ultrasound,  Ionotophoresis '4mg'$ /ml Dexamethasone, and Manual therapy.  All included unless contraindicated  PLAN FOR NEXT SESSION: likely d/c if doing well, check remaining goals   Laureen Abrahams, PT, DPT 03/01/22 1:09 PM

## 2022-03-02 ENCOUNTER — Ambulatory Visit (INDEPENDENT_AMBULATORY_CARE_PROVIDER_SITE_OTHER): Payer: Medicare HMO | Admitting: Internal Medicine

## 2022-03-02 ENCOUNTER — Encounter: Payer: Self-pay | Admitting: Internal Medicine

## 2022-03-02 VITALS — BP 136/78 | HR 68 | Temp 98.3°F | Resp 16 | Ht 59.0 in | Wt 150.0 lb

## 2022-03-02 DIAGNOSIS — E039 Hypothyroidism, unspecified: Secondary | ICD-10-CM | POA: Diagnosis not present

## 2022-03-02 DIAGNOSIS — I10 Essential (primary) hypertension: Secondary | ICD-10-CM

## 2022-03-02 DIAGNOSIS — R739 Hyperglycemia, unspecified: Secondary | ICD-10-CM | POA: Insufficient documentation

## 2022-03-02 DIAGNOSIS — I493 Ventricular premature depolarization: Secondary | ICD-10-CM | POA: Diagnosis not present

## 2022-03-02 DIAGNOSIS — K580 Irritable bowel syndrome with diarrhea: Secondary | ICD-10-CM

## 2022-03-02 LAB — CBC WITH DIFFERENTIAL/PLATELET
Basophils Absolute: 0 10*3/uL (ref 0.0–0.1)
Basophils Relative: 0.5 % (ref 0.0–3.0)
Eosinophils Absolute: 0.1 10*3/uL (ref 0.0–0.7)
Eosinophils Relative: 1.3 % (ref 0.0–5.0)
HCT: 40.3 % (ref 36.0–46.0)
Hemoglobin: 13.3 g/dL (ref 12.0–15.0)
Lymphocytes Relative: 26.1 % (ref 12.0–46.0)
Lymphs Abs: 2.6 10*3/uL (ref 0.7–4.0)
MCHC: 33.1 g/dL (ref 30.0–36.0)
MCV: 90.3 fl (ref 78.0–100.0)
Monocytes Absolute: 0.7 10*3/uL (ref 0.1–1.0)
Monocytes Relative: 7.3 % (ref 3.0–12.0)
Neutro Abs: 6.4 10*3/uL (ref 1.4–7.7)
Neutrophils Relative %: 64.8 % (ref 43.0–77.0)
Platelets: 199 10*3/uL (ref 150.0–400.0)
RBC: 4.46 Mil/uL (ref 3.87–5.11)
RDW: 13.3 % (ref 11.5–15.5)
WBC: 9.8 10*3/uL (ref 4.0–10.5)

## 2022-03-02 LAB — HEPATIC FUNCTION PANEL
ALT: 42 U/L — ABNORMAL HIGH (ref 0–35)
AST: 28 U/L (ref 0–37)
Albumin: 4.2 g/dL (ref 3.5–5.2)
Alkaline Phosphatase: 74 U/L (ref 39–117)
Bilirubin, Direct: 0.1 mg/dL (ref 0.0–0.3)
Total Bilirubin: 0.5 mg/dL (ref 0.2–1.2)
Total Protein: 7.1 g/dL (ref 6.0–8.3)

## 2022-03-02 LAB — URINALYSIS, ROUTINE W REFLEX MICROSCOPIC
Bilirubin Urine: NEGATIVE
Hgb urine dipstick: NEGATIVE
Ketones, ur: NEGATIVE
Leukocytes,Ua: NEGATIVE
Nitrite: NEGATIVE
RBC / HPF: NONE SEEN (ref 0–?)
Specific Gravity, Urine: <=1.005 — AB (ref 1.000–1.030)
Total Protein, Urine: NEGATIVE
Urine Glucose: NEGATIVE
Urobilinogen, UA: 0.2 (ref 0.0–1.0)
pH: 6.5 (ref 5.0–8.0)

## 2022-03-02 LAB — HEMOGLOBIN A1C: Hgb A1c MFr Bld: 6.3 % (ref 4.6–6.5)

## 2022-03-02 LAB — BASIC METABOLIC PANEL
BUN: 14 mg/dL (ref 6–23)
CO2: 30 mEq/L (ref 19–32)
Calcium: 9.3 mg/dL (ref 8.4–10.5)
Chloride: 104 mEq/L (ref 96–112)
Creatinine, Ser: 0.9 mg/dL (ref 0.40–1.20)
GFR: 67.18 mL/min (ref >60.00)
Glucose, Bld: 78 mg/dL (ref 70–99)
Potassium: 3.8 mEq/L (ref 3.5–5.1)
Sodium: 141 mEq/L (ref 135–145)

## 2022-03-02 LAB — TSH: TSH: 0.95 u[IU]/mL (ref 0.35–5.50)

## 2022-03-02 MED ORDER — METOPROLOL TARTRATE 25 MG PO TABS: 25.0000 mg | ORAL_TABLET | Freq: Two times a day (BID) | ORAL | 0 refills | Status: DC

## 2022-03-02 NOTE — Progress Notes (Signed)
Subjective:  Patient ID: [name], female    DOB: [birth year]  Age: 67 y.o. MRN: [id number]  CC: Hypertension and Hypothyroidism   HPI [name] presents for f/up -  She complains of alternations in her stool color - sometimes dark brown, sometimes light brown, and sometimes black.  She is eating a lot of fiber and beans and using simethicone and she says it is causing gas and bloating.  Outpatient Medications Prior to Visit  Medication Sig Dispense Refill   albuterol (VENTOLIN HFA) 108 (90 Base) MCG/ACT inhaler INHALE 1-2 PUFFS INTO THE LUNGS DAILY AS NEEDED FOR WHEEZING OR SHORTNESS OF BREATH. 18 each 1   budesonide-formoterol (SYMBICORT) 160-4.5 MCG/ACT inhaler Inhale 2 puffs into the lungs in the morning and at bedtime. 3 each 5   cariprazine (VRAYLAR) 3 MG capsule      clonazePAM (KLONOPIN) 0.5 MG tablet Take 0.5 mg by mouth 2 (two) times daily as needed.     DULoxetine (CYMBALTA) 60 MG capsule Take 60 mg by mouth 2 (two) times daily at 10 AM and 5 PM.     fluticasone (FLONASE) 50 MCG/ACT nasal spray Place 1 spray into both nostrils 2 (two) times daily as needed for allergies or rhinitis. 48 mL 1   hydrocortisone 2.5 % ointment Apply topically 2 (two) times daily.     ketoconazole (NIZORAL) 2 % cream Apply topically daily.      levothyroxine (SYNTHROID) 25 MCG tablet TAKE 1 TABLET BY MOUTH EVERY DAY 90 tablet 1   montelukast (SINGULAIR) 10 MG tablet Take 1 tablet (10 mg total) by mouth at bedtime. 90 tablet 1   MYRBETRIQ 50 MG TB24 tablet Take 50 mg by mouth daily.     NURTEC 75 MG TBDP TAKE 1 TABLET BY MOUTH EVERY OTHER DAY 40 tablet 2   promethazine (PHENERGAN) 12.5 MG tablet Take 1 tablet (12.5 mg total) by mouth every 6 (six) hours as needed for nausea or vomiting. 30 tablet 1   rosuvastatin (CRESTOR) 20 MG tablet TAKE 1 TABLET BY MOUTH EVERY DAY 90 tablet 1   spironolactone (ALDACTONE) 25 MG tablet TAKE 1 TABLET (25 MG TOTAL) BY MOUTH DAILY. 90 tablet 0    trihexyphenidyl (ARTANE) 2 MG tablet Take 1 tablet (2 mg total) by mouth daily. 90 tablet 1   zinc gluconate 50 MG tablet Take 50 mg by mouth daily.     metoprolol tartrate (LOPRESSOR) 25 MG tablet TAKE 1 TABLET BY MOUTH TWICE A DAY 180 tablet 0   Azelastine HCl 0.15 % SOLN Place 1-2 sprays into both nostrils 2 (two) times daily. 30 mL 5   Carbinoxamine Maleate 4 MG TABS Take 1 tablet (4 mg total) by mouth every 6 (six) hours. 120 tablet 5   HYDROcodone-acetaminophen (NORCO/VICODIN) 5-325 MG tablet Take 1-2 tablets by mouth every 6 (six) hours as needed for moderate pain. (Patient not taking: Reported on 01/19/2022) 30 tablet 0   oxyCODONE (ROXICODONE) 5 MG immediate release tablet Take 1-2 tablets (5-10 mg total) by mouth every 4 (four) hours as needed for severe pain. No more than 6 tablets per day. (Patient not taking: Reported on 01/19/2022) 30 tablet 0   rizatriptan (MAXALT-MLT) 10 MG disintegrating tablet TAKE 1 TABLET EVERY DAY AS NEEDED FOR MIGRAINE, MAY REPEAT IN 2 HRS IF NEEDED. MAX 2 DOSES/WK (Patient not taking: Reported on 01/19/2022) 9 tablet 2   Suvorexant (BELSOMRA) 10 MG TABS Take 10 mg by mouth at bedtime.  traMADol (ULTRAM) 50 MG tablet Take 1-2 tablets (50-100 mg total) by mouth 3 (three) times daily as needed. (Patient not taking: Reported on 01/19/2022) 30 tablet 0   No facility-administered medications prior to visit.    ROS Review of Systems  Constitutional:  Negative for chills, diaphoresis, fatigue and fever.  HENT: Negative.    Eyes: Negative.   Respiratory:  Negative for cough, chest tightness, shortness of breath and wheezing.   Cardiovascular:  Negative for chest pain, palpitations and leg swelling.  Gastrointestinal:  Positive for constipation and diarrhea. Negative for abdominal pain, blood in stool, nausea and vomiting.       Intermittent cramping and bloating  Genitourinary:  Positive for flank pain. Negative for dysuria and hematuria.       Right flank  pain for many years  Musculoskeletal:  Negative for arthralgias and myalgias.  Skin: Negative.   Neurological: Negative.  Negative for dizziness and weakness.  Hematological:  Negative for adenopathy. Does not bruise/bleed easily.  Psychiatric/Behavioral: Negative.      Objective:  BP 136/78 (BP Location: Right Arm, Patient Position: Sitting, Cuff Size: Large)   Pulse 68   Temp 98.3 F (36.8 C) (Oral)   Resp 16   Ht '4\' 11"'$  (1.499 m)   Wt 150 lb (68 kg)   LMP 03/05/2012 (Approximate)   SpO2 97%   BMI 30.30 kg/m   BP Readings from Last 3 Encounters:  03/02/22 136/78  12/10/21 118/70  12/08/21 126/80    Wt Readings from Last 3 Encounters:  03/02/22 150 lb (68 kg)  02/28/22 142 lb (64.4 kg)  12/08/21 149 lb (67.6 kg)    Physical Exam Vitals reviewed.  HENT:     Nose: Nose normal.     Mouth/Throat:     Mouth: Mucous membranes are moist.  Eyes:     General: No scleral icterus.    Conjunctiva/sclera: Conjunctivae normal.  Cardiovascular:     Rate and Rhythm: Normal rate and regular rhythm.     Heart sounds: No murmur heard. Pulmonary:     Effort: Pulmonary effort is normal.     Breath sounds: No stridor. No wheezing, rhonchi or rales.  Abdominal:     General: Abdomen is protuberant. Bowel sounds are normal. There is no distension.     Palpations: There is no hepatomegaly, splenomegaly or mass.     Tenderness: There is abdominal tenderness. There is no guarding or rebound.     Hernia: No hernia is present.     Comments: Ttp in right flank  Musculoskeletal:        General: Normal range of motion.     Cervical back: Neck supple.     Right lower leg: No edema.     Left lower leg: No edema.  Lymphadenopathy:     Cervical: No cervical adenopathy.  Skin:    General: Skin is warm and dry.  Neurological:     General: No focal deficit present.     Mental Status: She is alert.  Psychiatric:        Mood and Affect: Mood normal.        Behavior: Behavior normal.      Lab Results  Component Value Date   WBC 9.8 03/02/2022   HGB 13.3 03/02/2022   HCT 40.3 03/02/2022   PLT 199.0 03/02/2022   GLUCOSE 78 03/02/2022   CHOL 258 (H) 03/09/2021   TRIG 116.0 03/09/2021   HDL 71.90 03/09/2021   LDLDIRECT 143.0 09/14/2016  LDLCALC 163 (H) 03/09/2021   ALT 42 (H) 03/02/2022   AST 28 03/02/2022   NA 141 03/02/2022   K 3.8 03/02/2022   CL 104 03/02/2022   CREATININE 0.90 03/02/2022   BUN 14 03/02/2022   CO2 30 03/02/2022   TSH 0.95 03/02/2022   INR 0.94 12/21/2017   HGBA1C 6.3 03/02/2022   MICROALBUR 1.3 08/24/2021    MR SHOULDER RIGHT WO CONTRAST  Result Date: 11/12/2021 CLINICAL DATA:  Shoulder pain. Rotator cuff disorder suspected. Evaluate for rotator cuff tear. EXAM: MRI OF THE RIGHT SHOULDER WITHOUT CONTRAST TECHNIQUE: Multiplanar, multisequence MR imaging of the shoulder was performed. No intravenous contrast was administered. COMPARISON:  Right shoulder radiographs 08/26/2021 FINDINGS: Rotator cuff: There is fluid bright signal extending into a partial-thickness tear of the anterior supraspinatus bursal sided tendon fibers measuring up to 12 mm in AP dimension (sagittal series 9, image 13) and involving up to 50% of the transverse tendon dimension (coronal series 12 images 11 and 12). Up to approximately 1.3 cm tendon retraction. The infraspinatus, subscapularis, and teres minor are intact. Muscles:  Mild anterior supraspinatus muscle atrophy. Biceps long head: The intra-articular long head of the biceps tendon is intact. Acromioclavicular Joint: There are mild degenerative changes of the acromioclavicular joint including joint space narrowing, subchondral marrow edema, and peripheral osteophytosis. Type II acromion. Mild-to-moderate fluid within the subacromial/subdeltoid bursa. There is a low signal loose body measuring up to 11 mm within the anterior aspect of the subacromial/subdeltoid bursa, just inferior to the lateral clavicular metaphysis  (coronal image 11, sagittal image 5, and axial image 5). Additional punctate adjacent 2 mm loose body slightly more laterally (axial image 5). Glenohumeral Joint: Diffuse high-grade partial and full-thickness cartilage loss throughout the glenoid and humeral head, greatest within the superomedial aspect of the humeral head in the anterior glenoid. Labrum: Moderate attenuation and degenerative irregularity of the posterosuperior glenoid labrum. Bones:  No acute fracture. Other: None. IMPRESSION: 1. Partial-thickness bursal sided tear of the supraspinatus tendon measuring up to 12 mm in AP dimension and involving up to 50% of the transverse tendon dimension. 2. Mild anterior supraspinatus muscle atrophy. 3. Mild degenerative changes of the acromioclavicular joint. 4. Mild-to-moderate subacromial/subdeltoid bursitis. 11 mm loose body within the anterior aspect of the subacromial/subdeltoid bursa, just inferior to the lateral clavicle. 5. Moderate glenohumeral osteoarthritis. Electronically Signed   By: Yvonne Kendall M.D.   On: 11/12/2021 09:17    Assessment & Plan:   Teesha was seen today for hypertension and hypothyroidism.  Diagnoses and all orders for this visit:  Hypothyroidism, unspecified type- She is euthyroid. -     TSH; Future -     TSH  PVC (premature ventricular contraction) -     metoprolol tartrate (LOPRESSOR) 25 MG tablet; Take 1 tablet (25 mg total) by mouth 2 (two) times daily. -     TSH; Future -     TSH  Essential hypertension- Her blood pressure is well-controlled. -     metoprolol tartrate (LOPRESSOR) 25 MG tablet; Take 1 tablet (25 mg total) by mouth 2 (two) times daily. -     Urinalysis, Routine w reflex microscopic; Future -     Hepatic function panel; Future -     CBC with Differential/Platelet; Future -     Basic metabolic panel; Future -     Basic metabolic panel -     CBC with Differential/Platelet -     Hepatic function panel -     Urinalysis, Routine  w reflex  microscopic  Chronic hyperglycemia- She has prediabetes. -     Hemoglobin A1c; Future -     Basic metabolic panel; Future -     Basic metabolic panel -     Hemoglobin A1c  Irritable bowel syndrome with diarrhea- Her symptoms are adequately well-controlled with supplements.   I have discontinued Keonna B. Linse's rizatriptan, Belsomra, oxyCODONE, HYDROcodone-acetaminophen, and traMADol. I have also changed her metoprolol tartrate. Additionally, I am having her maintain her ketoconazole, zinc gluconate, hydrocortisone, Myrbetriq, clonazePAM, cariprazine, DULoxetine, trihexyphenidyl, promethazine, budesonide-formoterol, Azelastine HCl, Carbinoxamine Maleate, fluticasone, montelukast, levothyroxine, rosuvastatin, Nurtec, spironolactone, and albuterol.  Meds ordered this encounter  Medications   metoprolol tartrate (LOPRESSOR) 25 MG tablet    Sig: Take 1 tablet (25 mg total) by mouth 2 (two) times daily.    Dispense:  180 tablet    Refill:  0    DX Code Needed  .     Follow-up: Return in about 6 months (around 08/31/2022).  [name], MD

## 2022-03-02 NOTE — Patient Instructions (Signed)
Irritable Bowel Syndrome, Adult  Irritable bowel syndrome (IBS) is a group of symptoms that affects the organs responsible for digestion (gastrointestinal tract, or GI tract). IBS is not one specific disease. To regulate how the GI tract works, the body sends signals back and forth between the intestines and the brain. If you have IBS, there may be a problem with these signals. As a result, the GI tract does not function normally. The intestines may become more sensitive and overreact to certain things. This may be especially true when you eat certain foods or when you are under stress. There are four main types of IBS. These may be determined based on the consistency of your stool (feces): IBS with mostly (predominance of) diarrhea. IBS with predominance of constipation. IBS with mixed bowel habits. This includes both diarrhea and constipation. IBS unclassified. This includes IBS that cannot be categorized into one of the other three main types. It is important to know which type of IBS you have. Certain treatments are more likely to be helpful for certain types of IBS. What are the causes? The exact cause of IBS is not known. What increases the risk? You may have a higher risk for IBS if you: Are female. Are younger than 40 years. Have a family history of IBS. Have a mental health condition, such as depression, anxiety, or post-traumatic stress disorder. Have had a bacterial infection of your GI tract. What are the signs or symptoms? Symptoms of IBS vary from person to person. The main symptom is abdominal pain or discomfort. Other symptoms usually include one or more of the following: Diarrhea, constipation, or both. Swelling or bloating in the abdomen. Feeling full after eating a small or regular-sized meal. Frequent gas. Mucus in the stool. A feeling of having more stool left after a bowel movement. Symptoms tend to come and go. They may be triggered by stress, mental health  conditions, or certain foods. How is this diagnosed? This condition may be diagnosed based on a physical exam, your medical history, and your symptoms. You may have tests, such as: Blood tests. Stool test. Colonoscopy. This is a procedure in which your GI tract is viewed with a long, thin, flexible tube. How is this treated? There is no cure for IBS, but treatment can help relieve symptoms. Treatment depends on the type of IBS you have, and may include: Changes to your diet, such as: Avoiding foods that cause symptoms. Drinking more water. Following a low-FODMAP (fermentable oligosaccharides, disaccharides, monosaccharides, and polyols) diet for up to 6 weeks, or as told by your health care provider. FODMAPs are sugars that are hard for some people to digest. Eating more fiber. Eating small meals at the same times every day. Medicines. These may include: Fiber supplements, if you have constipation. Medicine to control diarrhea (antidiarrheal medicines). Medicine to help control muscle tightening (spasms) in your GI tract (antispasmodic medicines). Medicines to help with mental health conditions, such as antidepressants. Talk therapy or counseling. Working with a dietitian to help create a food plan that is right for you. Managing your stress. Follow these instructions at home: Eating and drinking  Eat a healthy diet. Eat 5-6 small meals a day. Try to eat meals at about the same times each day. Do not eat large meals. Gradually eat more fiber-rich foods. These include whole grains, fruits, and vegetables. This may be especially helpful if you have IBS with constipation. Eat a diet low in FODMAPs. You may need to avoid foods such as   citrus fruits, cabbage, garlic, and onions. Drink enough fluid to keep your urine pale yellow. Keep a journal of foods that seem to trigger symptoms. Avoid foods and drinks that: Contain added sugar. Make your symptoms worse. These may include dairy  products, caffeinated drinks, and carbonated drinks. Alcohol use Do not drink alcohol if: Your health care provider tells you not to drink. You are pregnant, may be pregnant, or are planning to become pregnant. If you drink alcohol: Limit how much you have to: 0-1 drink a day for women. 0-2 drinks a day for men. Know how much alcohol is in your drink. In the [place] one drink equals one 12 oz bottle of beer (355 mL), one 5 oz glass of wine (148 mL), or one 1 oz glass of hard liquor (44 mL) General instructions Take over-the-counter and prescription medicines only as told by your health care provider. This includes supplements. Get enough exercise. Do at least 150 minutes of moderate-intensity exercise each week. Manage your stress. Getting enough sleep and exercise can help you manage stress. Keep all follow-up visits. This is important. This includes all visits with your health care provider and therapist. Where to find more information International Foundation for Functional Gastrointestinal Disorders: aboutibs.org National Institute of Diabetes and Digestive and Kidney Diseases: niddk.nih.gov Contact a health care provider if: You have constant pain. You lose weight. You have diarrhea that gets worse. You have bleeding from the rectum. You vomit often. You have a fever. Get help right away if: You have severe abdominal pain. You have diarrhea with symptoms of dehydration, such as dizziness or dry mouth. You have bloody or black stools. You have severe abdominal bloating. You have vomiting that does not stop. You have blood in your vomit. Summary Irritable bowel syndrome (IBS) is not one specific disease. It is a group of symptoms that affects digestion. Your intestines may become more sensitive and overreact to certain things. This may be especially true when you eat certain foods or when you are under stress. There is no cure for IBS, but treatment can help relieve  symptoms. This information is not intended to replace advice given to you by your health care provider. Make sure you discuss any questions you have with your health care provider. Document Revised: 01/07/2021 Document Reviewed: 01/07/2021 Elsevier Patient Education  2023 Elsevier Inc.  

## 2022-03-02 NOTE — Telephone Encounter (Signed)
Called patient to discuss sleep study results she states she currently has a cpap machine and can't stand to wear it. Patient would like to know is there any other options for her. Dr. Ander Slade please advise

## 2022-03-03 ENCOUNTER — Encounter: Payer: Self-pay | Admitting: Orthopaedic Surgery

## 2022-03-03 ENCOUNTER — Ambulatory Visit (INDEPENDENT_AMBULATORY_CARE_PROVIDER_SITE_OTHER): Payer: Medicare HMO | Admitting: Orthopaedic Surgery

## 2022-03-03 DIAGNOSIS — F3132 Bipolar disorder, current episode depressed, moderate: Secondary | ICD-10-CM | POA: Diagnosis not present

## 2022-03-03 DIAGNOSIS — R69 Illness, unspecified: Secondary | ICD-10-CM | POA: Diagnosis not present

## 2022-03-03 DIAGNOSIS — Z9889 Other specified postprocedural states: Secondary | ICD-10-CM

## 2022-03-03 NOTE — Progress Notes (Signed)
The patient is here for follow-up as a relates to her right shoulder and her right hip.  She did have an intra-articular steroid injection under ultrasound earlier this month by Dr. Rolena Infante.  She says her hip pain is gone now.  Her x-rays were normal and according to his note he still no obvious findings or pathology around the right hip with ultrasound guidance.  The injection went well.  She says the hip is now pain-free.  She is able to get out of a chair easier.  She has been going to therapy for her right shoulder and she says she has no complaints of the right shoulder and is doing well.  Her right shoulder arthroscopy was several months ago.  Her right shoulder is moving well with minimal discomfort and some weakness.  Her right hip moves smoothly and fluidly and is asymptomatic.  At this point follow-up can be as needed since she is doing so well.  If she does develop any issues at all she knows to reach out to [place].

## 2022-03-04 ENCOUNTER — Encounter: Payer: Medicare HMO | Admitting: Rehabilitative and Restorative Service Providers"

## 2022-03-05 ENCOUNTER — Encounter: Payer: Self-pay | Admitting: Internal Medicine

## 2022-03-08 ENCOUNTER — Ambulatory Visit: Payer: Medicare HMO | Admitting: Physical Therapy

## 2022-03-08 ENCOUNTER — Telehealth: Payer: Self-pay | Admitting: Internal Medicine

## 2022-03-08 ENCOUNTER — Encounter: Payer: Self-pay | Admitting: Physical Therapy

## 2022-03-08 DIAGNOSIS — M25511 Pain in right shoulder: Secondary | ICD-10-CM | POA: Diagnosis not present

## 2022-03-08 DIAGNOSIS — M25611 Stiffness of right shoulder, not elsewhere classified: Secondary | ICD-10-CM | POA: Diagnosis not present

## 2022-03-08 DIAGNOSIS — R600 Localized edema: Secondary | ICD-10-CM

## 2022-03-08 DIAGNOSIS — K582 Mixed irritable bowel syndrome: Secondary | ICD-10-CM

## 2022-03-08 DIAGNOSIS — M6281 Muscle weakness (generalized): Secondary | ICD-10-CM | POA: Diagnosis not present

## 2022-03-08 NOTE — Telephone Encounter (Signed)
Pt already notified of sleep study results  See other phone note dated 03/01/22

## 2022-03-08 NOTE — Telephone Encounter (Signed)
Pt had appointment on 1.23.24 and they discussed her getting an RX for IBS, pt declined the RX at the time but would now like to be prescribed something.   Please call pt to discuss: [phone]

## 2022-03-08 NOTE — Therapy (Addendum)
OUTPATIENT PHYSICAL THERAPY SHOULDER TREATMENT DISCHARGE SUMMARY   Patient Name: [name] MRN: [id number] DOB:06-29-1955, 67 y.o., female Today's Date: 03/08/2022  END OF SESSION:  PT End of Session - 03/08/22 1144     Visit Number 9    Number of Visits 20    Date for PT Re-Evaluation 03/26/22    Progress Note Due on Visit 10    PT Start Time 1143    PT Stop Time 1223    PT Time Calculation (min) 40 min    Activity Tolerance Patient tolerated treatment well;No increased pain    Behavior During Therapy WFL for tasks assessed/performed                  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   Hypothyroidism    Intermittent palpitations    cardiology--  dr [place]   Interstitial cystitis    Limited jaw range of motion    s/p  bilateral TMJ surgery,  age 54s   Migraines    Moderate persistent asthma    pulmologist-- dr Halford Chessman   OA (osteoarthritis)    both knees   OSA on CPAP    per last sleep study 09/ 2017  mild OSA, AHI13/hr   PONV (postoperative nausea and vomiting)    PVC (premature ventricular contraction)    Rosacea    Sensation of pressure in bladder area    per pt intermittant   TMJ arthralgia    Urticaria    Wears glasses    White coat syndrome without hypertension    05-25-2017  per pt hx hypertension yrs ago, no issues after quitting stressful job   Past Surgical History:  Procedure Laterality Date   24 HOUR [place] STUDY N/A  09/22/2015   Procedure: 24 HOUR [place] STUDY;  Surgeon: Doran Stabler, MD;  Location: [facility];  Service: Gastroenterology;  Laterality: N/A;   ADENOIDECTOMY     ANAL FISSURE REPAIR  age 35  (102)   BREAST EXCISIONAL BIOPSY Right 04-16-2003  dr p. young  [facility]   benign   BUNIONECTOMY Right early 2000s   CARPAL TUNNEL RELEASE Left age 48 (8)   CHOLECYSTECTOMY OPEN  age 14   CYSTO WITH HYDRODISTENSION N/A 06/02/2017   Procedure: CYSTOSCOPY/HYDRODISTENSION OF BLADDER;  Surgeon: Irine Seal, MD;  Location: [facility];  Service: Urology;  Laterality: N/A;   CYSTO/ HYDRODISTENTION/  INSTILLATION THERAPY  1990s   DX LAPAROSCOPY/  DX HYSTEROSCOPY/  D & C  08-07-2007   dr dove  [place]  10-21-2008   dr Harolyn Rutherford  [facility]   ESOPHAGEAL MANOMETRY N/A 09/22/2015   Procedure: ESOPHAGEAL MANOMETRY (EM);  Surgeon: Doran Stabler, MD;  Location: [facility];  Service: Gastroenterology;  Laterality: N/A;  with impedence  FINGER ARTHROPLASTY Bilateral    left little finger 2016:  right middle finger 06/ 2018   KNEE ARTHROSCOPY Bilateral x3 left ;  x3  right-- last one age 89 (79)   NASAL SEPTUM SURGERY  age 89s   ROBOT ASSISTED REDUCTION PARAESOPHAGEAL HIATAL HERNIA/ TYPE 2 MEDIASTINAL DISSECTION/ PRIMARY HIATAL HERNIA REPAIR/ ANTERIOR & POSTERIOR GASTROPEXY/ NISSEN FUNDOPLICATION  0000000   DR GROSS  [facility]   SINOSCOPY     TEMPOROMANDIBULAR JOINT SURGERY Bilateral x3  -- age 89s   per pt used graft   TONSILLECTOMY     TOTAL KNEE ARTHROPLASTY Left 12/26/2017   Procedure: LEFT TOTAL KNEE ARTHROPLASTY;  Surgeon: Gaynelle Arabian, MD;  Location: [facility];  Service: Orthopedics;  Laterality: Left;  32mn   TRANSTHORACIC ECHOCARDIOGRAM  12-06-2017   dr [place]  ef 55-60%, grade 1 diastoilc dysfunction, trivial MR   TRIGGER FINGER RELEASE Bilateral last one 2017   several release's bilaterally   ULNAR NERVE TRANSPOSITION Bilateral age 83104(1980)   Patient Active  Problem List   Diagnosis Date Noted   Chronic hyperglycemia 03/02/2022   Hyperlipidemia LDL goal <100 08/24/2021   Encounter for general adult medical examination with abnormal findings 03/14/2021   Estrogen deficiency 03/09/2021   Cervical cancer screening 03/09/2021   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   Grade I diastolic dysfunction [id number]  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/2019   Osteoarthritis of glenohumeral joint 03/26/2019   Iron deficiency anemia 04/28/2018   OA (osteoarthritis) of knee 12/26/2017   PVC (premature ventricular contraction) 11/21/2017   Dysphonia 09/26/2017   Vitamin D deficiency, unspecified 10/13/2016   Primary osteoarthritis of left hand 06/30/2016   Osteoporosis without current pathological fracture 03/22/2016   Paraesophageal hiatal hernia s/p robotic repair & fundoplication [id number]  Bipolar 1 disorder, mixed, moderate ([place] 01/29/2016   Class 1 obesity with body mass index (BMI) of 32.0 to 32.9 in adult 01/06/2016   Migraines 11/21/2014   OSA on CPAP 01/30/2007   GERD 11/02/2006   DERMATITIS, HANDS 11/02/2006   Hypothyroidism 10/07/2006   Bipolar affective ([place] 10/07/2006   Essential hypertension 10/07/2006   RHINITIS, CHRONIC 10/07/2006   Seasonal and perennial allergic rhinitis 10/07/2006   Moderate persistent asthma, uncomplicated [id number]  Irritable bowel syndrome 10/07/2006   INTERSTITIAL CYSTITIS 10/07/2006   ROSACEA 10/07/2006   Osteoarthritis of both knees 10/07/2006   Disturbance in sleep behavior 10/07/2006    PCP:  JJanith Lima MD   REFERRING PROVIDER: BMcarthur Rossetti MD  REFERRING DIAG: M75.41 (ICD-10-CM) - Impingement syndrome of right shoulder  THERAPY DIAG:  Acute pain of right shoulder  Stiffness of right shoulder, not elsewhere classified  Localized edema  Muscle weakness (generalized)  Rationale for Evaluation and Treatment: Rehabilitation  ONSET DATE:  12/24/21  SUBJECTIVE:  SUBJECTIVE STATEMENT: Shoulder is more sore and tight today; woke up in the middle of the night, and hasn't subsided.  PERTINENT HISTORY: Rt shoulder arthroscopy 12/24/21 DM2, anxiety, bipolar, depression, eczema, h/o melanoma 2000's, OA, migraines, white coat symdrome without HTN, TKA-left 2019, knee arthroscopy bil 1999,   PAIN:  NPRS scale: 4/10 today, prior to yesterday 1/10 Pain location: Rt shoulder Pain description: achy, sore Aggravating factors: sleeping on the right side Relieving factors: ice, pt stating she has pain meds but hasn't been using them  PRECAUTIONS: no lifting over 5 pounds, shoulder arthroscopy on 12/24/21  WEIGHT BEARING RESTRICTIONS: No  FALLS:  Has patient fallen in last 6 months? Yes, pt stated she was pushing a recycle can down the driveway and she fell down when the can got out of control in March of 2023.   LIVING ENVIRONMENT: Lives with: lives with their family and lives with their spouse Lives in: House/apartment Stairs: Yes: External: 3 steps; none Has following equipment at home: None  OCCUPATION: On disability, pt volunteers at church  PLOF: [place] hurting, reach the back of my head, donning clothes without pain or difficutly    OBJECTIVE:   PATIENT SURVEYS:  01/12/22: FOTO intake:   48% 02/15/22: FOTO 55 03/08/22: FOTO 57  POSTURE: 01/12/22: rounded shoulders, forward head   UPPER EXTREMITY ROM:   (Blank rows = not tested)  UPPER EXTREMITY MMT:  MMT Right 01/12/22 Left 01/12/22 Right 03/01/22 Left 03/01/21  Shoulder flexion 3/5  4/5 5/5   Shoulder extension 3/5 4/5 5/5   Shoulder abduction      Shoulder adduction      Shoulder internal rotation 3/5 4/5 5/5   Shoulder external rotation 3/5 4/5 5/5   Grip Strength   31.5# 39.4#  (Blank rows = not tested)   PALPATION:  TTP: Right anterior, lateral and posterior.    TODAY'S TREATMENT:                                                                                                                           DATE: 03/08/22 TherEx: Rows pronated; 3 sec hold; L4 band; x10 reps Low rows in pronation x10; L4 band Demonstrated ability to lift 5# weight from floor to counter with RUE only, no difficulty or pain Discussed continued HEP and pt verbalized understanding, provided next level putty for pt to continue to work on grip strength.  Self Care Discussed FOTO results as well as PT recommendations.  Will keep scheduled appts but it pt is doing well she may cancel and d/c.    03/01/22 TherEx: Rows 2x10; 1st set neutral wrist, 2nd set pronated; 3 sec hold; L4 band Low rows in pronation 2x10; L4 band MMT assessments - see above with review of metrics and overall progress Provided L1 putty and demonstrated exercises for grip strength and pinch strength   02/22/22 TherEx: Rt row with palm down 2x10; 3 sec hold; L3 band Bent over row on  Rt with L3 band; 3 sec hold; 2x10 Palm down with low anchor row; L3 band; 2x10 Overhead press 2x10 bil; 2# DB Standing abduction with palms down bil 2#; 2x10 Standing flexion with palms down bil 2# 2x10   02/15/22 TherEx: Supine shoulder flexion with 2# bar AA 2x10; 3 sec hold Supine shoulder flexion on Rt 3# 2x10 Sidelying ER on Rt 3# 2x10 Sidelying Rt abduction to 90 deg 3# 2x10 Rows L4 band 2x10 Seated internal rotation stretch with strap 5x5 sec hold (modified HEP to this exercise as pt prefers this one) Bicep curl to overhead press 2# bil 2x10  02/03/22 Supine arm raises 2 sets of 20 for 3 seconds (2# and 3#) Supine IR  stretch 10X 10 seconds Thumb up the back 10X 10 seconds Pull to chest/Rows Green 20X 3 seconds Shoulder IR Green theraband 2 sets of 10 slow eccentrics  Shoulder ER Green theraband 2 sets of 10 slow eccentrics  PATIENT EDUCATION: Education details: HEP, POC Person educated: Patient Education method: Consulting civil engineer, Demonstration, Verbal cues, and Handouts Education comprehension: verbalized understanding, returned demonstration, and verbal cues required  HOME EXERCISE PROGRAM: Access Code: [email or web address] URL: [email or web address] Date: 02/22/2022 Prepared by: Faustino Congress  Exercises - Supine Shoulder Flexion Extension AAROM with Dowel  - 2-3 x daily - 7 x weekly - 10 reps - Supine Shoulder External Rotation in 45 Degrees Abduction AAROM with Dowel  - 2-3 x daily - 7 x weekly - 10 reps - Standing Row with Anchored Resistance  - 2-3 x daily - 7 x weekly - 2 sets - 10 reps - Seated Shoulder Flexion Towel Slide at Table Top Full Range of Motion  - 2-3 x daily - 7 x weekly - 10 reps - 5 seconds hold - Seated Shoulder External Rotation PROM on Table  - 2-3 x daily - 7 x weekly - 10 reps - 5 seconds hold - Shoulder External Rotation with Anchored Resistance  - 2 x daily - 7 x weekly - 2 sets - 10 reps - Shoulder extension with resistance - Neutral  - 2 x daily - 7 x weekly - 2 sets - 10 reps - Shoulder Internal Rotation with Resistance  - 2 x daily - 7 x weekly - 2 sets - 10 reps - Supine Scapular Protraction in Flexion with Dumbbells  - 1 x daily - 7 x weekly - 2 sets - 20 reps - 3 seconds hold - Supine Shoulder Internal Rotation Stretch  - 2 x daily - 7 x weekly - 1 sets - 20 reps - 10 seconds hold - Standing Shoulder Internal Rotation Stretch with Towel  - 2 x daily - 7 x weekly - 1 sets - 10 reps - 10 sec hold - Standing Bent Over Shoulder Row with Resistance  - 1 x daily - 7 x weekly - 2 sets - 10 reps - Standing Single Arm Low Row with Anchored Resistance  - 1 x daily - 7 x  weekly - 2 sets - 10 reps  ASSESSMENT:  CLINICAL IMPRESSION: Pt has met all LTGs except FOTO at this time.  Will likely d/c PT, but with new onset of pain will see if that subsides over the next week and if not pt will return to PT to reassess.     OBJECTIVE IMPAIRMENTS: decreased mobility, decreased ROM, decreased strength, increased edema, impaired UE functional use, and pain.   ACTIVITY LIMITATIONS: carrying, lifting, sleeping, bathing, dressing, and reach over head  PARTICIPATION LIMITATIONS: community  activity  PERSONAL FACTORS:  see pertinent history above  are also affecting patient's functional outcome.   REHAB POTENTIAL: Good  CLINICAL DECISION MAKING: Stable/uncomplicated  EVALUATION COMPLEXITY: Moderate   GOALS: Goals reviewed with patient? Yes  SHORT TERM GOALS: (target date for Short term goals are 3 weeks 02/05/22)   1.Patient will demonstrate independent use of home exercise program to maintain progress from in clinic treatments. Goal status: MET 02/06/23  LONG TERM GOALS: (target dates for all long term goals are 10 weeks  03/26/2022)    1. Patient will demonstrate/report pain at worst less than or equal to 2/10 to facilitate minimal limitation in daily activity secondary to pain symptoms. Goal status: MET 03/01/22   2. Patient will demonstrate independent use of home exercise program to facilitate ability to maintain/progress functional gains from skilled physical therapy services. Goal status:  MET 03/08/21   3. Patient will demonstrate FOTO outcome > or = 61 % to indicate reduced disability due to condition. Goal status: ONGOING 03/08/22   4.  Patient will demonstrate right  UE MMT 5/5 throughout to facilitate lifting, reaching, carrying at [facility] in daily activity.  Goal status: MET 03/01/22   5.  Patient will demonstrate right [place] joint AROM WFL s symptoms to facilitate usual overhead reaching, self care, dressing at PLOF.   Goal status: MET 03/01/22   6.   Pt will improve her Rt shoulder ER to >/= 70 degrees for ADL's.  Goal status: Met 02/03/2022  7. Pt will be able to lift 5 # from floor to counter height using her Rt UE.  Goal Status: MET 03/08/22  PLAN:  PT FREQUENCY: 1-2x/week  PT DURATION: 10 weeks  PLANNED INTERVENTIONS: Therapeutic exercises, Therapeutic activity, Neuro Muscular re-education, Balance training, Gait training, Patient/Family education, Joint mobilization, Stair training, DME instructions, Dry Needling, Electrical stimulation, Traction, Cryotherapy, vasopneumatic device Moist heat, Taping, Ultrasound, Ionotophoresis '4mg'$ /ml Dexamethasone, and Manual therapy.  All included unless contraindicated  PLAN FOR NEXT SESSION: likely d/c if doing well, reassess if pain still present (10th visit)  Laureen Abrahams, PT, DPT 03/08/22 12:31 PM   PHYSICAL THERAPY DISCHARGE SUMMARY  Visits from Start of Care: 9  Current functional level related to goals / functional outcomes: See above   Remaining deficits: See above   Education / Equipment: HEP   Patient agrees to discharge. Patient goals were partially met. Patient is being discharged due to being pleased with the current functional level.  Laureen Abrahams, PT, DPT 04/13/22 1:54 PM  [facility] [address] [place], [place], [address] Phone: [phone]   Fax:  [phone]

## 2022-03-10 MED ORDER — DICYCLOMINE HCL 10 MG PO CAPS: 10.0000 mg | ORAL_CAPSULE | Freq: Three times a day (TID) | ORAL | 2 refills | Status: DC

## 2022-03-10 NOTE — Telephone Encounter (Signed)
Options of treatment will include surgical options-evaluation by ENT, inspire device, aggressive weight loss efforts will always help    Schedule for follow-up with either myself or one of the APP's to discuss options of treatment

## 2022-03-10 NOTE — Telephone Encounter (Signed)
Patient called back to follow up on previous message, was told once we have more information we will give her a call.

## 2022-03-10 NOTE — Telephone Encounter (Signed)
Called and spoke with pt and have scheduled her an appt with Dr. Jenetta Downer. Nothing further needed.

## 2022-03-10 NOTE — Addendum Note (Signed)
Addended by: Janith Lima on: 03/10/2022 10:58 AM   Modules accepted: Orders

## 2022-03-11 ENCOUNTER — Encounter: Payer: Medicare HMO | Admitting: Rehabilitative and Restorative Service Providers"

## 2022-03-12 ENCOUNTER — Telehealth: Payer: Self-pay

## 2022-03-12 ENCOUNTER — Ambulatory Visit: Payer: Medicare HMO | Admitting: Pulmonary Disease

## 2022-03-12 ENCOUNTER — Encounter: Payer: Self-pay | Admitting: Pulmonary Disease

## 2022-03-12 VITALS — BP 112/64 | HR 82 | Ht 59.0 in | Wt 142.0 lb

## 2022-03-12 DIAGNOSIS — G4733 Obstructive sleep apnea (adult) (pediatric): Secondary | ICD-10-CM | POA: Diagnosis not present

## 2022-03-12 NOTE — Telephone Encounter (Signed)
Key: [id number]

## 2022-03-12 NOTE — Addendum Note (Signed)
Addended by: June Leap on: 03/12/2022 04:05 PM   Modules accepted: Orders

## 2022-03-12 NOTE — Patient Instructions (Signed)
Referral to Dr. Redmond Baseman for evaluation for an inspire device  [email or web address] to obtain more information about an inspire device  Other options of treatment as we discussed including an oral device, surgical interventions to widen the space of the back of the throat Weight loss efforts  Call us with significant concerns  Follow-up in 4 to 5 months

## 2022-03-12 NOTE — Telephone Encounter (Signed)
Your request has been approved Authorization Expiration Date: 02/08/2023

## 2022-03-12 NOTE — Progress Notes (Signed)
[name]    [id number]    02-Mar-1955  Primary Care Physician:Jones, Arvid Right, MD  Referring Physician: Janith Lima, MD [address] [place],  [place] [address]  Chief complaint:   Patient being seen for history of obstructive sleep apnea  HPI:  Sleep apnea diagnosed in 2017, noted to have mild sleep apnea Was on CPAP for many years  Patient had a fall about March, had injury to her nasal bridge and has not been able to tolerate CPAP since then  Has managed to lose about 50 pounds in the last year.  She did have a repeat study that shows an AHI of 15  She is in today for discussion regarding other options of treatment as she is does not believe she will be able to tolerate CPAP therapy  Options of treatment discussed include an oral device, inspire device, surgical options  She does have some difficulty initiating and maintaining sleep dry mouth in the morning  No morning headaches Denies significant memory issues  her health has been relatively good the last year  She feels radiated with claustrophobic and CPAP will not work for her  Outpatient Encounter Medications as of 03/12/2022  Medication Sig   albuterol (VENTOLIN HFA) 108 (90 Base) MCG/ACT inhaler INHALE 1-2 PUFFS INTO THE LUNGS DAILY AS NEEDED FOR WHEEZING OR SHORTNESS OF BREATH.   cariprazine (VRAYLAR) 3 MG capsule    clonazePAM (KLONOPIN) 0.5 MG tablet Take 0.5 mg by mouth 2 (two) times daily as needed.   DULoxetine (CYMBALTA) 60 MG capsule Take 60 mg by mouth 2 (two) times daily at 10 AM and 5 PM.   fluticasone (FLONASE) 50 MCG/ACT nasal spray Place 1 spray into both nostrils 2 (two) times daily as needed for allergies or rhinitis.   hydrocortisone 2.5 % ointment Apply topically 2 (two) times daily.   ketoconazole (NIZORAL) 2 % cream Apply topically daily.    levothyroxine (SYNTHROID) 25 MCG tablet TAKE 1 TABLET BY MOUTH EVERY DAY   metoprolol tartrate (LOPRESSOR) 25 MG tablet Take 1  tablet (25 mg total) by mouth 2 (two) times daily.   MYRBETRIQ 50 MG TB24 tablet Take 50 mg by mouth daily.   NURTEC 75 MG TBDP TAKE 1 TABLET BY MOUTH EVERY OTHER DAY   promethazine (PHENERGAN) 12.5 MG tablet Take 1 tablet (12.5 mg total) by mouth every 6 (six) hours as needed for nausea or vomiting.   rosuvastatin (CRESTOR) 20 MG tablet TAKE 1 TABLET BY MOUTH EVERY DAY   spironolactone (ALDACTONE) 25 MG tablet TAKE 1 TABLET (25 MG TOTAL) BY MOUTH DAILY.   trihexyphenidyl (ARTANE) 2 MG tablet Take 1 tablet (2 mg total) by mouth daily.   zinc gluconate 50 MG tablet Take 50 mg by mouth daily.   Azelastine HCl 0.15 % SOLN Place 1-2 sprays into both nostrils 2 (two) times daily.   budesonide-formoterol (SYMBICORT) 160-4.5 MCG/ACT inhaler Inhale 2 puffs into the lungs in the morning and at bedtime.   Carbinoxamine Maleate 4 MG TABS Take 1 tablet (4 mg total) by mouth every 6 (six) hours.   dicyclomine (BENTYL) 10 MG capsule Take 1 capsule (10 mg total) by mouth 3 (three) times daily before meals. (Patient not taking: Reported on 03/12/2022)   montelukast (SINGULAIR) 10 MG tablet Take 1 tablet (10 mg total) by mouth at bedtime.   No facility-administered encounter medications on file as of 03/12/2022.    Allergies as of 03/12/2022 -  Review Complete 03/12/2022  Allergen Reaction Noted   Emetrol Itching 08/21/2018   Indomethacin Other (See Comments) 08/17/2016   Iodinated contrast media Itching 04/16/2011   Amlodipine Swelling 12/26/2014   Carbamazepine Other (See Comments) 04/28/2018   Pseudoephedrine Other (See Comments) 06/20/2014   Risperidone and related Other (See Comments) 12/12/2012   Bupivacaine Hives 05/30/2013   Pentazocine Other (See Comments) 06/20/2014   Propoxyphene Itching and Nausea And Vomiting 06/20/2014   Sulfa antibiotics Other (See Comments) 05/24/2017   Tramadol Other (See Comments) 06/30/2016   Aspirin Other (See Comments) 02/15/2014   Codeine Itching and Other (See  Comments) 05/30/2013   Etodolac Rash and Other (See Comments) 06/20/2014   Propranolol Nausea Only and Other (See Comments) 12/12/2012    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   Hypothyroidism    Intermittent palpitations    cardiology--  dr [place]   Interstitial cystitis    Limited jaw range of motion    s/p  bilateral TMJ surgery,  age 78s   Migraines    Moderate persistent asthma    pulmologist-- dr Halford Chessman   OA (osteoarthritis)    both knees   OSA on CPAP    per last sleep study 09/ 2017  mild OSA, AHI13/hr   PONV (postoperative nausea and vomiting)    PVC (premature ventricular contraction)    Rosacea    Sensation of pressure in bladder area    per pt intermittant   TMJ arthralgia    Urticaria    Wears glasses    White coat syndrome without hypertension    05-25-2017  per pt hx hypertension yrs ago, no issues after quitting stressful job    Past Surgical History:  Procedure Laterality Date   24 HOUR [place] STUDY N/A 09/22/2015   Procedure: 24 HOUR [place] STUDY;  Surgeon: Doran Stabler, MD;  Location: [facility];  Service: Gastroenterology;  Laterality: N/A;   ADENOIDECTOMY     ANAL FISSURE REPAIR  age 66  (39)   BREAST EXCISIONAL BIOPSY Right 04-16-2003  dr p. young  [facility]   benign   BUNIONECTOMY Right early 2000s   CARPAL TUNNEL RELEASE Left age 20 (80)   CHOLECYSTECTOMY OPEN  age 83   CYSTO WITH HYDRODISTENSION N/A 06/02/2017   Procedure:  CYSTOSCOPY/HYDRODISTENSION OF BLADDER;  Surgeon: Irine Seal, MD;  Location: [facility];  Service: Urology;  Laterality: N/A;   CYSTO/ HYDRODISTENTION/  INSTILLATION THERAPY  1990s   DX LAPAROSCOPY/  DX HYSTEROSCOPY/  D & C  08-07-2007   dr dove  [place]  10-21-2008   dr Harolyn Rutherford  [facility]   ESOPHAGEAL MANOMETRY N/A 09/22/2015   Procedure: ESOPHAGEAL MANOMETRY (EM);  Surgeon: Doran Stabler, MD;  Location: [facility];  Service: Gastroenterology;  Laterality: N/A;  with  impedence    FINGER ARTHROPLASTY Bilateral    left little finger 2016:  right middle finger 06/ 2018   KNEE ARTHROSCOPY Bilateral x3 left ;  x3  right-- last one age 80 (66)   NASAL SEPTUM SURGERY  age 76s   ROBOT ASSISTED REDUCTION PARAESOPHAGEAL HIATAL HERNIA/ TYPE 2 MEDIASTINAL DISSECTION/ PRIMARY HIATAL HERNIA REPAIR/ ANTERIOR & POSTERIOR GASTROPEXY/ NISSEN FUNDOPLICATION  [id number]   DR GROSS  [facility]   SINOSCOPY     TEMPOROMANDIBULAR JOINT SURGERY Bilateral x3  -- age 89s   per pt used graft   TONSILLECTOMY     TOTAL KNEE ARTHROPLASTY Left 12/26/2017   Procedure: LEFT TOTAL KNEE ARTHROPLASTY;  Surgeon: Gaynelle Arabian, MD;  Location: [facility];  Service: Orthopedics;  Laterality: Left;  21mn   TRANSTHORACIC ECHOCARDIOGRAM  12-06-2017   dr jMartinique  ef 55-60%, grade 1 diastoilc dysfunction, trivial MR   TRIGGER FINGER RELEASE Bilateral last one 2017   several release's bilaterally   ULNAR NERVE TRANSPOSITION Bilateral age 67((31    Family History  Problem Relation Age of Onset   Hypertension Mother        deceased from MVA complications   Thyroid disease Mother    Allergic rhinitis Mother    Testicular cancer Father    Allergic rhinitis Father    Colon cancer Sister    Colon polyps Sister    Healthy Sister    Healthy Brother    Coronary artery disease Other    Stomach cancer Neg Hx     Social History   Socioeconomic History   Marital status: Divorced    Spouse  name: Not on file   Number of children: 0   Years of education: Not on file   Highest education level: Bachelor's degree (e.g., BA, AB, BS)  Occupational History   Occupation: disability  Tobacco Use   Smoking status: Never   Smokeless tobacco: Never  Vaping Use   Vaping Use: Never used  Substance and Sexual Activity   Alcohol use: Yes    Alcohol/week: 0.0 standard drinks of alcohol    Comment: once every 3-4 months   Drug use: No   Sexual activity: Not Currently    Partners: Male    Birth control/protection: Post-menopausal  Other Topics Concern   Not on file  Social History Narrative   Not on file   Social Determinants of Health   Financial Resource Strain: Low Risk  (03/24/2021)   Overall Financial Resource Strain (CARDIA)    Difficulty of Paying Living Expenses: Not hard at all  Food Insecurity: No Food Insecurity (03/24/2021)   Hunger Vital Sign    Worried About Running Out of Food in the Last Year: Never true    Ran Out of Food in the Last Year: Never true  Transportation Needs: No Transportation Needs (03/24/2021)   PRAPARE - THydrologist(Medical): No    Lack of Transportation (Non-Medical): No  Physical Activity: Insufficiently Active (03/24/2021)   Exercise Vital Sign    Days of Exercise per Week: 7 days    Minutes of Exercise per Session: 20 min  Stress: Stress Concern Present (03/24/2021)   FHugo   Feeling of Stress : To some extent  Social Connections: Moderately Integrated (03/24/2021)   Social Connection and Isolation Panel [NHANES]    Frequency of Communication with Friends and Family: More than three times a week    Frequency  of Social Gatherings with Friends and Family: Twice a week    Attends Religious Services: More than 4 times per year    Active Member of [employer] or Organizations: Yes    Attends Music therapist: More than 4 times per year     Marital Status: Divorced  Intimate Partner Violence: Not At Risk (03/24/2021)   Humiliation, Afraid, Rape, and Kick questionnaire    Fear of Current or Ex-Partner: No    Emotionally Abused: No    Physically Abused: No    Sexually Abused: No    Review of Systems  Respiratory:  Positive for apnea. Negative for shortness of breath.   Psychiatric/Behavioral:  Positive for sleep disturbance.     There were no vitals filed for this visit.    Physical Exam Constitutional:      Appearance: She is obese.  HENT:     Head: Normocephalic.     Mouth/Throat:     Mouth: Mucous membranes are moist.     Comments: Mallampati 3 Eyes:     General: No scleral icterus.    Conjunctiva/sclera: Conjunctivae normal.  Cardiovascular:     Rate and Rhythm: Normal rate and regular rhythm.     Heart sounds: No murmur heard.    No friction rub.  Pulmonary:     Effort: No respiratory distress.     Breath sounds: No stridor. No wheezing or rhonchi.  Musculoskeletal:     Cervical back: No rigidity or tenderness.  Neurological:     Mental Status: She is alert.  Psychiatric:        Mood and Affect: Mood normal.       03/12/2022   10:00 AM  Results of the Epworth flowsheet  Sitting and reading 0  Watching TV 0  Sitting, inactive in a public place (e.g. a theatre or a meeting) 0  As a passenger in a car for an hour without a break 0  Lying down to rest in the afternoon when circumstances permit 1  Sitting and talking to someone 0  Sitting quietly after a lunch without alcohol 0  In a car, while stopped for a few minutes in traffic 0  Total score 1    Data Reviewed: Previous sleep study from 2017 reviewed showing AHI of 12.8  Most recent sleep study 02/28/2022 does reveal moderate obstructive sleep apnea  Assessment:  Moderate obstructive sleep apnea -In for discussions regarding options of treatment -Has not tolerated CPAP therapy -Feels claustrophobic with CPAP use  Options of treatment  including oral device, surgical options, inspire device were discussed with the patient today  Other information regarding inspire device also was suggested to the patient  Pathophysiology of sleep disordered breathing reviewed Treatment options reviewed  Still has moderate sleep apnea despite good amount of weight loss  Plan/Recommendations: Will schedule the patient for referral with Dr. Redmond Baseman to evaluate for inspire device  Encouraged to continue weight loss efforts  Encouraged to call with significant concerns  Tentative follow-up in 4 to 5 months   Sherrilyn Rist MD [facility] 03/12/2022, 10:37 AM  CC: Janith Lima, MD

## 2022-03-15 ENCOUNTER — Encounter: Payer: Medicare HMO | Admitting: Physical Therapy

## 2022-03-16 NOTE — Telephone Encounter (Signed)
Prolia VOB initiated via [email or web address]  Last Prolia inj 10/29/21 Next Prolia inj due 04/30/22

## 2022-03-18 ENCOUNTER — Encounter: Payer: Medicare HMO | Admitting: Rehabilitative and Restorative Service Providers"

## 2022-03-20 ENCOUNTER — Other Ambulatory Visit: Payer: Self-pay | Admitting: Internal Medicine

## 2022-03-20 DIAGNOSIS — E039 Hypothyroidism, unspecified: Secondary | ICD-10-CM

## 2022-03-22 ENCOUNTER — Encounter: Payer: Medicare HMO | Admitting: Physical Therapy

## 2022-03-25 ENCOUNTER — Ambulatory Visit (INDEPENDENT_AMBULATORY_CARE_PROVIDER_SITE_OTHER): Payer: Medicare HMO

## 2022-03-25 ENCOUNTER — Encounter: Payer: Medicare HMO | Admitting: Rehabilitative and Restorative Service Providers"

## 2022-03-25 VITALS — Ht 59.0 in | Wt 142.0 lb

## 2022-03-25 DIAGNOSIS — Z0000 Encounter for general adult medical examination without abnormal findings: Secondary | ICD-10-CM | POA: Diagnosis not present

## 2022-03-25 DIAGNOSIS — Z1211 Encounter for screening for malignant neoplasm of colon: Secondary | ICD-10-CM | POA: Diagnosis not present

## 2022-03-25 NOTE — Patient Instructions (Signed)
[name] , Thank you for taking time to come for your Medicare Wellness Visit. I appreciate your ongoing commitment to your health goals. Please review the following plan we discussed and let me know if I can assist you in the future.   These are the goals we discussed:  Goals      Exercise 3x per week (30 min per time)     Ray (see longitudinal plan of care for additional care plan information)  Current Barriers:  Chronic Disease Management support, education, and care coordination needs related to Hypertension, Hyperlipidemia, GERD, Asthma, Hypothyroidism, Anxiety, Osteoporosis, Overactive Bladder, and fluid retention   Hypertension BP Readings from Last 3 Encounters:  11/27/19 104/60  11/08/19 118/70  09/11/19 120/78  Pharmacist Clinical Goal(s): Over the next 90 days, patient will work with PharmD and providers to maintain BP goal <140/90 Current regimen:  Metoprolol tartrate 25 mg 1 tablet twice daily Interventions: Discussed DASH eating plan recommendations: Emphasizes vegetables, fruits, and whole-grains Includes fat-free or low-fat dairy products, fish, poultry, beans, nuts, and vegetable oils Limits foods that are high in saturated fat. These foods include fatty meats, full-fat dairy products, and tropical oils such as coconut, palm kernel, and palm oils. Limits sugar-sweetened beverages and sweets Limiting sodium intake to < 1500 mg/day Discussed recommendations for moderate aerobic exercise for 150 minutes/week spread out over 5 days for heart healthy lifestyle Discussed the importance of checking blood pressure at home Patient self care activities - Over the next 90 days, patient will: Check blood pressure weekly, document, and provide at future appointments Ensure daily salt intake < 2300 mg/day Bring BP cuff to next in office visit to help with directions for use  Hyperlipidemia Lab Results  Component Value Date/Time    LDLCALC 79 09/11/2019 12:01 PM   LDLDIRECT 143.0 09/14/2016 10:37 AM  Pharmacist Clinical Goal(s): Over the next 90 days, patient will work with PharmD and providers to maintain LDL goal < 100 Current regimen:  Rosuvastatin 20 mg 1 tablet daily Interventions: Discussed lowering cholesterol through diet by: Limiting foods with cholesterol such as liver and other organ meats, egg yolks, shrimp, and whole milk dairy products Avoiding saturated fats and trans fats and incorporating healthier fats, such as lean meat, nuts, and unsaturated oils like canola and olive oils Eating foods with soluble fiber such as whole-grain cereals such as oatmeal and oat bran, fruits such as apples, bananas, oranges, pears, and prunes, legumes such as kidney beans, lentils, chick peas, black-eyed peas, and lima beans, and green leafy vegetables Limiting alcohol intake Patient self care activities - Over the next 90 days, patient will: Continue current medications  Asthma Pharmacist Clinical Goal(s): Over the next 90 days, patient will work with PharmD and providers to control symptoms of asthma Current regimen:  Albuterol nebulizer 0.083% 3 mLs by nebulization PRN (only used a few times since shes had it) Ventolin HFA 1-2 puffs as needed for wheezing or shortness of breath Flovent HFA 220 mcg/act 1-2 puffs in the morning and at bedtime Montelukast 10 mg 1 tablet every day at bedtime Interventions: Discuss proper inhaler technique and rinsing mouth out with each use of Flovent Patient self care activities - Over the next 90 days, patient will: Continue current medications  Hypothyroidism Pharmacist Clinical Goal(s) Over the next 90 days, patient will work with PharmD and providers to maintain TSH between 0.35-4.50 Current regimen:  Levothyroxine 50 mcg 1 tablet daily in the morning  Interventions: Discussed taking levothyroxine 30 minutes after alendronate and 30 minutes before food Recommended taking prior  to lunch on Sundays if within a couple of hours of typical administration time Patient self care activities - Over the next 90 days, patient will: Continue current medications Establish a routine with taking levothyroxine consistently and without missing doses  Anxiety Pharmacist Clinical Goal(s) Over the next 90 days, patient will work with PharmD and providers to manage symptoms of anxiety Current regimen:  Clonazepam 0.5 mg 1 tablet twice daily Sertraline 100 mg 2 tablets in the morning Interventions: Discussed the possibility of switching to other medications for anxiety Patient self care activities - Over the next 90 days, patient will: Discuss with Sharon Seller about changing medications for anxiety  Osteoporosis Pharmacist Clinical Goal(s) Over the next 90 days, patient will work with PharmD and providers to improve bone density and prevent fractures Current regimen:  Alendronate 70 mg 1 tablet every 7 days Vitamin D3 5000 units 1 capsule daily Interventions: Discussed recommended [phone] units of vitamin D daily and 1200 mg of calcium daily from dietary and supplemental sources.  Counseled on oral bisphosphonate administration: take in the morning, 30 minutes prior to food with 6-8 oz of water. Do not lie down for at least 30 minutes after taking.  Recommended weight-bearing and muscle strengthening exercises for building and maintaining bone density. Patient self care activities - Over the next 90 days, patient will: Continue current medications Ensure adequate supplementation with calcium  GERD Pharmacist Clinical Goal(s) Over the next 90 days, patient will work with PharmD and providers to minimize symptoms of heartburn Current regimen:  Famotidine 20 mg 1 tablet at bedtime Interventions: Discussed the option of increasing dose of famotidine Patient self care activities - Over the next 90 days, patient will: Reach out to Dr. Benjamine Mola about increasing dose or  frequency of famotidine  Overactive bladder Pharmacist Clinical Goal(s) Over the next 90 days, patient will work with PharmD and providers to manage symptoms of overactive bladder Current regimen:  Myrbetriq 25 mg 1 tablet daily Interventions: Discussed the possibility of increasing dose of Myrbetriq for additional bladder control Patient self care activities - Over the next 90 days, patient will: Discuss with Dr. Jeffie Pollock about increasing dose of Myrbetriq  Fluid retention Pharmacist Clinical Goal(s) Over the next 90 days, patient will work with PharmD and providers to improve fluid retention Current regimen:  Furosemide 20 mg 1 tablet daily as needed  Interventions: Discussed the possibility of increasing the dose of furosemide for additional fluid retention Patient self care activities - Over the next 90 days, patient will: Continue current medications  Medication management Pharmacist Clinical Goal(s): Over the next 90 days, patient will work with PharmD and providers to maintain optimal medication adherence Current pharmacy: [facility] Interventions Comprehensive medication review performed. Continue current medication management strategy Patient self care activities - Over the next 90 days, patient will: Focus on medication adherence by taking levothyroxine prior to lunch on days that she cannot take it in the morning Take medications as prescribed Report any questions or concerns to PharmD and/or provider(s)  Initial goal documentation         This is a list of the screening recommended for you and due dates:  Health Maintenance  Topic Date Due   Complete foot exam   03/09/2022   Mammogram  04/15/2022   Yearly kidney health urinalysis for diabetes  08/25/2022   Hemoglobin A1C  08/31/2022   Eye exam for diabetics  10/16/2022   Yearly kidney function blood test for diabetes  03/03/2023   Medicare Annual Wellness Visit  03/26/2023   DEXA scan (bone density measurement)   08/20/2023   Colon Cancer Screening  04/02/2024   Pap Smear  05/06/2024   DTaP/Tdap/Td vaccine (4 - Td or Tdap) 08/26/2030   Pneumonia Vaccine  Completed   Flu Shot  Completed   Hepatitis C Screening: USPSTF Recommendation to screen - Ages 39-79 yo.  Completed   HIV Screening  Completed   Zoster (Shingles) Vaccine  Completed   HPV Vaccine  Aged Out   COVID-19 Vaccine  Discontinued    Advanced directives: yes  Conditions/risks identified: none  Next appointment: Follow up in one year for your annual wellness visit 03/30/2023@3pm$  telephone   Preventive Care 65 Years and Older, Female Preventive care refers to lifestyle choices and visits with your health care provider that can promote health and wellness. What does preventive care include? A yearly physical exam. This is also called an annual well check. Dental exams once or twice a year. Routine eye exams. Ask your health care provider how often you should have your eyes checked. Personal lifestyle choices, including: Daily care of your teeth and gums. Regular physical activity. Eating a healthy diet. Avoiding tobacco and drug use. Limiting alcohol use. Practicing safe sex. Taking low-dose aspirin every day. Taking vitamin and mineral supplements as recommended by your health care provider. What happens during an annual well check? The services and screenings done by your health care provider during your annual well check will depend on your age, overall health, lifestyle risk factors, and family history of disease. Counseling  Your health care provider may ask you questions about your: Alcohol use. Tobacco use. Drug use. Emotional well-being. Home and relationship well-being. Sexual activity. Eating habits. History of falls. Memory and ability to understand (cognition). Work and work Statistician. Reproductive health. Screening  You may have the following tests or measurements: Height, weight, and BMI. Blood  pressure. Lipid and cholesterol levels. These may be checked every 5 years, or more frequently if you are over 17 years old. Skin check. Lung cancer screening. You may have this screening every year starting at age 42 if you have a 30-pack-year history of smoking and currently smoke or have quit within the past 15 years. Fecal occult blood test (FOBT) of the stool. You may have this test every year starting at age 33. Flexible sigmoidoscopy or colonoscopy. You may have a sigmoidoscopy every 5 years or a colonoscopy every 10 years starting at age 34. Hepatitis C blood test. Hepatitis B blood test. Sexually transmitted disease (STD) testing. Diabetes screening. This is done by checking your blood sugar (glucose) after you have not eaten for a while (fasting). You may have this done every 1-3 years. Bone density scan. This is done to screen for osteoporosis. You may have this done starting at age 65. Mammogram. This may be done every 1-2 years. Talk to your health care provider about how often you should have regular mammograms. Talk with your health care provider about your test results, treatment options, and if necessary, the need for more tests. Vaccines  Your health care provider may recommend certain vaccines, such as: Influenza vaccine. This is recommended every year. Tetanus, diphtheria, and acellular pertussis (Tdap, Td) vaccine. You may need a Td booster every 10 years. Zoster vaccine. You may need this after age 23. Pneumococcal 13-valent conjugate (PCV13) vaccine. One dose is recommended after age 56. Pneumococcal polysaccharide (  PPSV23) vaccine. One dose is recommended after age 41. Talk to your health care provider about which screenings and vaccines you need and how often you need them. This information is not intended to replace advice given to you by your health care provider. Make sure you discuss any questions you have with your health care provider. Document Released:  02/21/2015 Document Revised: 10/15/2015 Document Reviewed: 11/26/2014 Elsevier Interactive Patient Education  2017 [place] Prevention in the Home Falls can cause injuries. They can happen to people of all ages. There are many things you can do to make your home safe and to help prevent falls. What can I do on the outside of my home? Regularly fix the edges of walkways and driveways and fix any cracks. Remove anything that might make you trip as you walk through a door, such as a raised step or threshold. Trim any bushes or trees on the path to your home. Use bright outdoor lighting. Clear any walking paths of anything that might make someone trip, such as rocks or tools. Regularly check to see if handrails are loose or broken. Make sure that both sides of any steps have handrails. Any raised decks and porches should have guardrails on the edges. Have any leaves, snow, or ice cleared regularly. Use sand or salt on walking paths during winter. Clean up any spills in your garage right away. This includes oil or grease spills. What can I do in the bathroom? Use night lights. Install grab bars by the toilet and in the tub and shower. Do not use towel bars as grab bars. Use non-skid mats or decals in the tub or shower. If you need to sit down in the shower, use a plastic, non-slip stool. Keep the floor dry. Clean up any water that spills on the floor as soon as it happens. Remove soap buildup in the tub or shower regularly. Attach bath mats securely with double-sided non-slip rug tape. Do not have throw rugs and other things on the floor that can make you trip. What can I do in the bedroom? Use night lights. Make sure that you have a light by your bed that is easy to reach. Do not use any sheets or blankets that are too big for your bed. They should not hang down onto the floor. Have a firm chair that has side arms. You can use this for support while you get dressed. Do not have  throw rugs and other things on the floor that can make you trip. What can I do in the kitchen? Clean up any spills right away. Avoid walking on wet floors. Keep items that you use a lot in easy-to-reach places. If you need to reach something above you, use a strong step stool that has a grab bar. Keep electrical cords out of the way. Do not use floor polish or wax that makes floors slippery. If you must use wax, use non-skid floor wax. Do not have throw rugs and other things on the floor that can make you trip. What can I do with my stairs? Do not leave any items on the stairs. Make sure that there are handrails on both sides of the stairs and use them. Fix handrails that are broken or loose. Make sure that handrails are as long as the stairways. Check any carpeting to make sure that it is firmly attached to the stairs. Fix any carpet that is loose or worn. Avoid having throw rugs at the top or  bottom of the stairs. If you do have throw rugs, attach them to the floor with carpet tape. Make sure that you have a light switch at the top of the stairs and the bottom of the stairs. If you do not have them, ask someone to add them for you. What else can I do to help prevent falls? Wear shoes that: Do not have high heels. Have rubber bottoms. Are comfortable and fit you well. Are closed at the toe. Do not wear sandals. If you use a stepladder: Make sure that it is fully opened. Do not climb a closed stepladder. Make sure that both sides of the stepladder are locked into place. Ask someone to hold it for you, if possible. Clearly mark and make sure that you can see: Any grab bars or handrails. First and last steps. Where the edge of each step is. Use tools that help you move around (mobility aids) if they are needed. These include: Canes. Walkers. Scooters. Crutches. Turn on the lights when you go into a dark area. Replace any light bulbs as soon as they burn out. Set up your furniture so  you have a clear path. Avoid moving your furniture around. If any of your floors are uneven, fix them. If there are any pets around you, be aware of where they are. Review your medicines with your doctor. Some medicines can make you feel dizzy. This can increase your chance of falling. Ask your doctor what other things that you can do to help prevent falls. This information is not intended to replace advice given to you by your health care provider. Make sure you discuss any questions you have with your health care provider. Document Released: 11/21/2008 Document Revised: 07/03/2015 Document Reviewed: 03/01/2014 Elsevier Interactive Patient Education  2017 [employer].

## 2022-03-25 NOTE — Progress Notes (Signed)
I connected with  MADELYN STOBBE on 03/25/22 by a audio enabled telemedicine application and verified that I am speaking with the correct person using two identifiers.  Patient Location: Home  Provider Location: Office/Clinic  I discussed the limitations of evaluation and management by telemedicine. The patient expressed understanding and agreed to proceed.  Subjective:   JYKERIA GRANITO is a 67 y.o. female who presents for Medicare Annual (Subsequent) preventive examination.  Review of Systems    Cardiac Risk Factors include: advanced age (>67mn, >>60women);diabetes mellitus;hypertension    Objective:    Today's Vitals   03/25/22 1506  Weight: 142 lb (64.4 kg)  Height: 4' 11"$  (1.499 m)   Body mass index is 28.68 kg/m.     03/25/2022    3:19 PM 02/28/2022    8:07 PM 01/12/2022    1:44 PM 03/26/2021    4:13 PM 03/24/2021    1:22 PM 11/08/2019    1:35 PM 08/24/2018    9:21 AM  Advanced Directives  Does Patient Have a Medical Advance Directive? No No No No No No No  Would patient like information on creating a medical advance directive? No - Patient declined No - Patient declined No - Patient declined No - Patient declined No - Patient declined Yes (MAU/Ambulatory/Procedural Areas - Information given) No - Patient declined    Current Medications (verified) Outpatient Encounter Medications as of 03/25/2022  Medication Sig   albuterol (VENTOLIN HFA) 108 (90 Base) MCG/ACT inhaler INHALE 1-2 PUFFS INTO THE LUNGS DAILY AS NEEDED FOR WHEEZING OR SHORTNESS OF BREATH.   Azelastine HCl 0.15 % SOLN Place 1-2 sprays into both nostrils 2 (two) times daily.   budesonide-formoterol (SYMBICORT) 160-4.5 MCG/ACT inhaler Inhale 2 puffs into the lungs in the morning and at bedtime.   Carbinoxamine Maleate 4 MG TABS Take 1 tablet (4 mg total) by mouth every 6 (six) hours.   cariprazine (VRAYLAR) 3 MG capsule    clonazePAM (KLONOPIN) 0.5 MG tablet Take 0.5 mg by mouth 2 (two) times daily as needed.    dicyclomine (BENTYL) 10 MG capsule Take 1 capsule (10 mg total) by mouth 3 (three) times daily before meals. (Patient not taking: Reported on 03/12/2022)   DULoxetine (CYMBALTA) 60 MG capsule Take 60 mg by mouth 2 (two) times daily at 10 AM and 5 PM.   fluticasone (FLONASE) 50 MCG/ACT nasal spray Place 1 spray into both nostrils 2 (two) times daily as needed for allergies or rhinitis.   hydrocortisone 2.5 % ointment Apply topically 2 (two) times daily.   ketoconazole (NIZORAL) 2 % cream Apply topically daily.    levothyroxine (SYNTHROID) 25 MCG tablet TAKE 1 TABLET BY MOUTH EVERY DAY   metoprolol tartrate (LOPRESSOR) 25 MG tablet Take 1 tablet (25 mg total) by mouth 2 (two) times daily.   montelukast (SINGULAIR) 10 MG tablet Take 1 tablet (10 mg total) by mouth at bedtime.   MYRBETRIQ 50 MG TB24 tablet Take 50 mg by mouth daily.   NURTEC 75 MG TBDP TAKE 1 TABLET BY MOUTH EVERY OTHER DAY   promethazine (PHENERGAN) 12.5 MG tablet Take 1 tablet (12.5 mg total) by mouth every 6 (six) hours as needed for nausea or vomiting.   rosuvastatin (CRESTOR) 20 MG tablet TAKE 1 TABLET BY MOUTH EVERY DAY   spironolactone (ALDACTONE) 25 MG tablet TAKE 1 TABLET (25 MG TOTAL) BY MOUTH DAILY.   trihexyphenidyl (ARTANE) 2 MG tablet Take 1 tablet (2 mg total) by mouth daily.   zinc  gluconate 50 MG tablet Take 50 mg by mouth daily.   No facility-administered encounter medications on file as of 03/25/2022.    Allergies (verified) Emetrol, Indomethacin, Iodinated contrast media, Amlodipine, Carbamazepine, Pseudoephedrine, Risperidone and related, Bupivacaine, Pentazocine, Propoxyphene, Sulfa antibiotics, Tramadol, Aspirin, Codeine, Etodolac, and Propranolol   History: Past Medical History:  Diagnosis Date   Allergic rhinitis    Anxiety    Benign essential tremor    hands   Bipolar 1 disorder, mixed, moderate (HCC)    Claustrophobia    Depression    Eczema    Frequency of urination    History of frequent  urinary tract infections    History of gastroesophageal reflux (GERD)    05-25-2017  per pt no issues since hiatal hernia repair 01/ 2018   History of hiatal hernia    History of melanoma excision    early 2000s-- BACK   History of panic attacks    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   Hypothyroidism    Intermittent palpitations    cardiology--  dr Martinique   Interstitial cystitis    Limited jaw range of motion    s/p  bilateral TMJ surgery,  age 54s   Migraines    Moderate persistent asthma    pulmologist-- dr Halford Chessman   OA (osteoarthritis)    both knees   OSA on CPAP    per last sleep study 09/ 2017  mild OSA, AHI13/hr   PONV (postoperative nausea and vomiting)    PVC (premature ventricular contraction)    Rosacea    Sensation of pressure in bladder area    per pt intermittant   TMJ arthralgia    Urticaria    Wears glasses    White coat syndrome without hypertension    05-25-2017  per pt hx hypertension yrs ago, no issues after quitting stressful job   Past Surgical History:  Procedure Laterality Date   64 HOUR [place] STUDY N/A 09/22/2015   Procedure: 24 HOUR [place] STUDY;  Surgeon: Doran Stabler, MD;  Location: [facility];  Service: Gastroenterology;  Laterality: N/A;   ADENOIDECTOMY     ANAL FISSURE REPAIR  age 38  (86)   BREAST EXCISIONAL BIOPSY Right 04-16-2003  dr p. young  [facility]   benign   BUNIONECTOMY Right early 2000s   CARPAL TUNNEL RELEASE Left age 66 (54)   CHOLECYSTECTOMY OPEN  age 83   CYSTO WITH HYDRODISTENSION N/A 06/02/2017   Procedure: CYSTOSCOPY/HYDRODISTENSION OF BLADDER;  Surgeon: Irine Seal, MD;  Location[facility];  Service: Urology;  Laterality: N/A;   CYSTO/ HYDRODISTENTION/  INSTILLATION THERAPY  1990s   DX LAPAROSCOPY/  DX HYSTEROSCOPY/  D & C  08-07-2007   dr dove  [place]  10-21-2008   dr Harolyn Rutherford  [facility]   ESOPHAGEAL MANOMETRY N/A 09/22/2015   Procedure: ESOPHAGEAL MANOMETRY  (EM);  Surgeon: Doran Stabler, MD;  Location: [facility];  Service: Gastroenterology;  Laterality: N/A;  with impedence    FINGER ARTHROPLASTY Bilateral    left little finger 2016:  right middle finger 06/ 2018   KNEE ARTHROSCOPY Bilateral x3 left ;  x3  right-- last one age 84 (64)   NASAL SEPTUM SURGERY  age 75s   ROBOT ASSISTED REDUCTION PARAESOPHAGEAL HIATAL HERNIA/ TYPE 2 MEDIASTINAL DISSECTION/ PRIMARY HIATAL HERNIA REPAIR/ ANTERIOR & POSTERIOR GASTROPEXY/ NISSEN FUNDOPLICATION  0000000   DR GROSS  [facility]   SINOSCOPY  TEMPOROMANDIBULAR JOINT SURGERY Bilateral x3  -- age 89s   per pt used graft   TONSILLECTOMY     TOTAL KNEE ARTHROPLASTY Left 12/26/2017   Procedure: LEFT TOTAL KNEE ARTHROPLASTY;  Surgeon: Gaynelle Arabian, MD;  Location: [facility];  Service: Orthopedics;  Laterality: Left;  88mn   TRANSTHORACIC ECHOCARDIOGRAM  12-06-2017   dr jMartinique  ef 55-60%, grade 1 diastoilc dysfunction, trivial MR   TRIGGER FINGER RELEASE Bilateral last one 2017   several release's bilaterally   ULNAR NERVE TRANSPOSITION Bilateral age 67((34   Family History  Problem Relation Age of Onset   Hypertension Mother        deceased from MVA complications   Thyroid disease Mother    Allergic rhinitis Mother    Testicular cancer Father    Allergic rhinitis Father    Colon cancer Sister    Colon polyps Sister    Healthy Sister    Healthy Brother    Coronary artery disease Other    Stomach cancer Neg Hx    Social History   Socioeconomic History   Marital status: Divorced    Spouse name: Not on file   Number of children: 0   Years of education: Not on file   Highest education level: Bachelor's degree (e.g., BA, AB, BS)  Occupational History   Occupation: disability  Tobacco Use   Smoking status: Never   Smokeless tobacco: Never  Vaping Use   Vaping Use: Never used  Substance and Sexual Activity   Alcohol use: Yes    Alcohol/week: 0.0 standard drinks of alcohol    Comment:  once every 3-4 months   Drug use: No   Sexual activity: Not Currently    Partners: Male    Birth control/protection: Post-menopausal  Other Topics Concern   Not on file  Social History Narrative   Not on file   Social Determinants of Health   Financial Resource Strain: Low Risk  (03/23/2022)   Overall Financial Resource Strain (CARDIA)    Difficulty of Paying Living Expenses: Not hard at all  Food Insecurity: No Food Insecurity (03/23/2022)   Hunger Vital Sign    Worried About Running Out of Food in the Last Year: Never true    Ran Out of Food in the Last Year: Never true  Transportation Needs: No Transportation Needs (03/23/2022)   PRAPARE - THydrologist(Medical): No    Lack of Transportation (Non-Medical): No  Physical Activity: Insufficiently Active (03/23/2022)   Exercise Vital Sign    Days of Exercise per Week: 2 days    Minutes of Exercise per Session: 60 min  Stress: Unknown (03/23/2022)   [place]   Feeling of Stress : Patient refused  Social Connections: Unknown (03/23/2022)   Social Connection and Isolation Panel [NHANES]    Frequency of Communication with Friends and Family: Three times a week    Frequency of Social Gatherings with Friends and Family: Once a week    Attends Religious Services: Not on fAdvertising copywriteror Organizations: Yes    Attends CArchivistMeetings: More than 4 times per year    Marital Status: Divorced    Tobacco Counseling Counseling given: Not Answered   Clinical Intake:              How often do you need to have someone help you when you read instructions, pamphlets,  or other written materials from your doctor or pharmacy?: 1 - Never  Diabetic?no         Activities of Daily Living    03/23/2022    8:02 PM  In your present state of health, do you have any difficulty performing the following activities:   Hearing? 0  Vision? 1  Difficulty concentrating or making decisions? 0  Walking or climbing stairs? 1  Dressing or bathing? 0  Doing errands, shopping? 0  Preparing Food and eating ? N  Using the Toilet? N  In the past six months, have you accidently leaked urine? Y  Do you have problems with loss of bowel control? N  Managing your Medications? N  Managing your Finances? N  Housekeeping or managing your Housekeeping? N    Patient Care Team: Janith Lima, MD as PCP - General (Internal Medicine) [place], Peter M, MD as PCP - Cardiology (Cardiology) Michael Boston, MD as Consulting Physician (General Surgery) Danis, Kirke Corin, MD as Consulting Physician (Gastroenterology) Viona Gilmore, [facility] (Inactive) as Pharmacist (Pharmacist) Raylene Miyamoto, MD as Referring Physician (Otolaryngology) Linward Headland, NP as Nurse Practitioner (Psychology) Chesley Mires, MD as Consulting Physician (Pulmonary Disease) Valentina Shaggy, MD as Consulting Physician (Allergy and Immunology) Melissa Noon, OD as Referring Physician (Optometry)  Indicate any recent Medical Services you may have received from other than [facility] providers in the past year (date may be approximate).     Assessment:   This is a routine wellness examination for Tamme.  Hearing/Vision screen Hearing Screening - Comments:: Adequate hearing Vision Screening - Comments:: Adequate vision w/glasses. [place] [employer]  Dietary issues and exercise activities discussed: Current Exercise Habits: Home exercise routine, Type of exercise: yoga (chair yoga), Time (Minutes): 20, Frequency (Times/Week): 3, Weekly Exercise (Minutes/Week): 60, Intensity: Mild, Exercise limited by: neurologic condition(s);orthopedic condition(s)   Goals Addressed   None    Depression Screen    03/25/2022    3:12 PM 08/24/2021   10:41 AM 03/24/2021    1:13 PM 11/08/2019    1:40 PM 10/31/2018    6:38 PM 10/24/2017    3:05 PM 05/30/2015     1:50 PM  PHQ 2/9 Scores  PHQ - 2 Score 2 2 0 0 2 1 3  $ PHQ- 9 Score 10 12  0   9  Exception Documentation      Other- indicate reason in comment box   Not completed      Hx of depression,bipolar,and anxiety. Follows with psychiatrist.     Fall Risk    03/23/2022    8:02 PM 03/24/2021    1:01 PM 11/08/2019    1:37 PM 10/31/2018    6:38 PM 02/23/2018    8:34 AM  Fall Risk   Falls in the past year? 1 0 0 0 0  Number falls in past yr: 1 0 0 0 0  Injury with Fall? 1 0 0 0 0  Risk for fall due to :  Orthopedic patient Medication side effect Impaired balance/gait;Orthopedic patient   Follow up  Falls prevention discussed Falls evaluation completed;Falls prevention discussed Education provided Falls evaluation completed    FALL RISK PREVENTION PERTAINING TO THE HOME:  Any stairs in or around the home? Yes  If so, are there any without handrails? Yes  Home free of loose throw rugs in walkways, pet beds, electrical cords, etc? Yes  Adequate lighting in your home to reduce risk of falls? Yes   ASSISTIVE DEVICES UTILIZED  TO PREVENT FALLS:  Life alert? No  Use of a cane, walker or w/c? No  Grab bars in the bathroom? Yes  Shower chair or bench in shower? Yes  Elevated toilet seat or a handicapped toilet? No   Cognitive Function:        03/25/2022    3:21 PM 03/24/2021    1:22 PM  6CIT Screen  What Year? 0 points 0 points  What month? 0 points 0 points  What time? 0 points 0 points  Count back from 20 0 points 0 points  Months in reverse 0 points 0 points  Repeat phrase 2 points 0 points  Total Score 2 points 0 points    Immunizations Immunization History  Administered Date(s) Administered   Fluad Quad(high Dose 65+) 10/29/2021   Influenza Split 02/09/2012   Influenza Whole 12/08/2006, 12/01/2007   Influenza,inj,Quad PF,6+ Mos 12/11/2013, 11/21/2014, 10/13/2016, 10/24/2017, 10/31/2018, 11/08/2019   Influenza-Unspecified 12/01/2015, 10/21/2020   Moderna Covid-19 Vaccine  Bivalent Booster 6yr & up 12/29/2020   Moderna Sars-Covid-2 Vaccination 04/28/2019, 05/29/2019, 01/26/2020   PNEUMOCOCCAL CONJUGATE-20 03/09/2021   Pneumococcal Polysaccharide-23 12/12/2013   Tdap 04/16/2011, 06/25/2013, 08/25/2020   Zoster Recombinat (Shingrix) 05/30/2018, 08/16/2018    TDAP status: Up to date  Flu Vaccine status: Up to date  Pneumococcal vaccine status: Up to date  Covid-19 vaccine status: Completed vaccines  Qualifies for Shingles Vaccine? Yes   Zostavax completed Yes   Shingrix Completed?: No.    Education has been provided regarding the importance of this vaccine. Patient has been advised to call insurance company to determine out of pocket expense if they have not yet received this vaccine. Advised may also receive vaccine at local pharmacy or Health Dept. Verbalized acceptance and understanding.  Screening Tests Health Maintenance  Topic Date Due   FOOT EXAM  03/09/2022   MAMMOGRAM  04/15/2022   Diabetic kidney evaluation - Urine ACR  08/25/2022   HEMOGLOBIN A1C  08/31/2022   OPHTHALMOLOGY EXAM  10/16/2022   Diabetic kidney evaluation - eGFR measurement  03/03/2023   Medicare Annual Wellness (AWV)  03/26/2023   DEXA SCAN  08/20/2023   COLONOSCOPY (Pts 45-451yrInsurance coverage will need to be confirmed)  04/02/2024   PAP SMEAR-Modifier  05/06/2024   DTaP/Tdap/Td (4 - Td or Tdap) 08/26/2030   Pneumonia Vaccine 6561Years old  Completed   INFLUENZA VACCINE  Completed   Hepatitis C Screening  Completed   HIV Screening  Completed   Zoster Vaccines- Shingrix  Completed   HPV VACCINES  Aged Out   COVID-19 Vaccine  Discontinued    Health Maintenance  Health Maintenance Due  Topic Date Due   FOOT EXAM  03/09/2022    Colorectal cancer screening: Referral to GI placed yes. Pt aware the office will call re: appt.  Mammogram status: Ordered yes. Pt provided with contact info and advised to call to schedule appt.   Bone Density status: Completed  yes. Results reflect: Bone density results: NORMAL. Repeat every 5 years.  Lung Cancer Screening: (Low Dose CT Chest recommended if Age 66-80ears, 30 pack-year currently smoking OR have quit w/in 15years.) does not qualify.   Lung Cancer Screening Referral: no  Additional Screening:  Hepatitis C Screening: does not qualify; Completed yes  Vision Screening: Recommended annual ophthalmology exams for early detection of glaucoma and other disorders of the eye. Is the patient up to date with their annual eye exam?  Yes  Who is the provider or what is the name  of the office in which the patient attends annual eye exams? [place] If pt is not established with a provider, would they like to be referred to a provider to establish care? Yes .   Dental Screening: Recommended annual dental exams for proper oral hygiene  Community Resource Referral / Chronic Care Management: CRR required this visit?  no  CCM required this visit?  No      Plan:     I have personally reviewed and noted the following in the patient's chart:   Medical and social history Use of alcohol, tobacco or illicit drugs  Current medications and supplements including opioid prescriptions. Patient is not currently taking opioid prescriptions. Functional ability and status Nutritional status Physical activity Advanced directives List of other physicians Hospitalizations, surgeries, and ER visits in previous 12 months Vitals Screenings to include cognitive, depression, and falls Referrals and appointments  In addition, I have reviewed and discussed with patient certain preventive protocols, quality metrics, and best practice recommendations. A written personalized care plan for preventive services as well as general preventive health recommendations were provided to patient.     Roger Shelter, LPN   [id number]   Nurse Notes: pt sts she is doing well other than still some rt shoulder pain from  surgery a few months ago. **Pt does indicate she cannot get into Mychart and wants to know her lab results from January.** wants PCP to call and discuss Colonoscopy referral done.  **MMG due but pt states a dx MMG is needed due to fibrocystic disease of the breasts. **

## 2022-03-30 DIAGNOSIS — F410 Panic disorder [episodic paroxysmal anxiety] without agoraphobia: Secondary | ICD-10-CM | POA: Diagnosis not present

## 2022-03-30 DIAGNOSIS — R69 Illness, unspecified: Secondary | ICD-10-CM | POA: Diagnosis not present

## 2022-03-30 DIAGNOSIS — F3132 Bipolar disorder, current episode depressed, moderate: Secondary | ICD-10-CM | POA: Diagnosis not present

## 2022-03-30 DIAGNOSIS — F411 Generalized anxiety disorder: Secondary | ICD-10-CM | POA: Diagnosis not present

## 2022-04-06 NOTE — Telephone Encounter (Signed)
Forwarding to Rx Prior Auth Team 

## 2022-04-09 ENCOUNTER — Other Ambulatory Visit (HOSPITAL_COMMUNITY): Payer: Self-pay

## 2022-04-09 NOTE — Telephone Encounter (Signed)
Pt ready for scheduling for Prolia '60mg'$  on or after : 04/30/22  Out-of-pocket cost due at time of visit: $0 through pharmacy benefit  Primary: [employer] Prolia co-insurance: 20% Admin fee co-insurance: $20  Secondary: --- Prolia co-insurance:  Admin fee co-insurance:   Medical Benefit Details: Date Benefits were checked: 03/16/22 Deductible: NO/ Coinsurance: 20%/ Admin Fee: $20  Prior Auth: APPROVED PA# [id number]  Expiration Date: 10/17/22   Pharmacy benefit: Copay $0 If patient wants fill through the pharmacy benefit please send prescription to: [employer], and include estimated need by date in rx notes. Pharmacy will ship medication directly to the office.  Patient not eligible for Prolia Copay Card. Copay Card can make patient's cost as little as $25. Link to apply: [email or web address]  ** This summary of benefits is an estimation of the patient's out-of-pocket cost. Exact cost may very based on individual plan coverage.

## 2022-04-15 ENCOUNTER — Encounter: Payer: Self-pay | Admitting: Radiology

## 2022-04-28 DIAGNOSIS — R69 Illness, unspecified: Secondary | ICD-10-CM | POA: Diagnosis not present

## 2022-04-28 DIAGNOSIS — F3132 Bipolar disorder, current episode depressed, moderate: Secondary | ICD-10-CM | POA: Diagnosis not present

## 2022-05-03 ENCOUNTER — Other Ambulatory Visit: Payer: Self-pay | Admitting: Allergy & Immunology

## 2022-05-05 DIAGNOSIS — F410 Panic disorder [episodic paroxysmal anxiety] without agoraphobia: Secondary | ICD-10-CM | POA: Diagnosis not present

## 2022-05-05 DIAGNOSIS — F411 Generalized anxiety disorder: Secondary | ICD-10-CM | POA: Diagnosis not present

## 2022-05-05 DIAGNOSIS — R69 Illness, unspecified: Secondary | ICD-10-CM | POA: Diagnosis not present

## 2022-05-05 DIAGNOSIS — F3132 Bipolar disorder, current episode depressed, moderate: Secondary | ICD-10-CM | POA: Diagnosis not present

## 2022-05-20 ENCOUNTER — Encounter: Payer: Self-pay | Admitting: Family

## 2022-05-20 ENCOUNTER — Ambulatory Visit (INDEPENDENT_AMBULATORY_CARE_PROVIDER_SITE_OTHER): Payer: Medicare HMO | Admitting: Family

## 2022-05-20 VITALS — BP 118/78 | HR 85 | Temp 97.7°F | Resp 16 | Ht 59.0 in

## 2022-05-20 DIAGNOSIS — H6122 Impacted cerumen, left ear: Secondary | ICD-10-CM | POA: Diagnosis not present

## 2022-05-20 DIAGNOSIS — J0110 Acute frontal sinusitis, unspecified: Secondary | ICD-10-CM

## 2022-05-20 MED ORDER — AMOXICILLIN-POT CLAVULANATE 875-125 MG PO TABS: 1.0000 | ORAL_TABLET | Freq: Two times a day (BID) | ORAL | 0 refills | Status: DC

## 2022-05-20 NOTE — Progress Notes (Signed)
Patient ID: [name], female    DOB: [birth year], 67 y.o.   MRN: [id number]  Chief Complaint  Patient presents with   Sinusitis    X 2 weeks Green mucus and increased sinus pressure    HPI:      Sinus sx:    pt reports sinus pressure/pain, green nasal mucus, right ear congestion, headache for 2 weeks. Denies fever, sore throat or cough. She uses Astelin nasal spray daily, Karbinal antihistamine,Singulair as pt has many allergies including environmental.   Assessment & Plan:  1. Acute non-recurrent frontal sinusitis - sending Augmentin, advised on use & SE, continue home sinus meds, advised discussing steroid nasal spray w/Allergist at upcoming appt, recommend saline nasal spray tid prn, drink plenty of fluids. Ok to take Tylenol prn for HA.  - amoxicillin-clavulanate (AUGMENTIN) 875-125 MG tablet; Take 1 tablet by mouth 2 (two) times daily after a meal.  Dispense: 14 tablet; Refill: 0   Subjective:    Outpatient Medications Prior to Visit  Medication Sig Dispense Refill   albuterol (VENTOLIN HFA) 108 (90 Base) MCG/ACT inhaler INHALE 1-2 PUFFS INTO THE LUNGS DAILY AS NEEDED FOR WHEEZING OR SHORTNESS OF BREATH. 18 each 1   Azelastine HCl 0.15 % SOLN Place 1-2 sprays into both nostrils 2 (two) times daily. 30 mL 5   Carbinoxamine Maleate 4 MG TABS Take 1 tablet (4 mg total) by mouth every 6 (six) hours. 120 tablet 5   cariprazine (VRAYLAR) 3 MG capsule      clonazePAM (KLONOPIN) 0.5 MG tablet Take 0.5 mg by mouth 2 (two) times daily as needed.     dicyclomine (BENTYL) 10 MG capsule Take 1 capsule (10 mg total) by mouth 3 (three) times daily before meals. 90 capsule 2   fluticasone (FLONASE) 50 MCG/ACT nasal spray Place 1 spray into both nostrils 2 (two) times daily as needed for allergies or rhinitis. 48 mL 1   hydrocortisone 2.5 % ointment Apply topically 2 (two) times daily.     ketoconazole (NIZORAL) 2 % cream Apply topically daily.      levothyroxine (SYNTHROID) 25 MCG tablet  TAKE 1 TABLET BY MOUTH EVERY DAY 90 tablet 1   metoprolol tartrate (LOPRESSOR) 25 MG tablet Take 1 tablet (25 mg total) by mouth 2 (two) times daily. 180 tablet 0   MYRBETRIQ 50 MG TB24 tablet Take 50 mg by mouth daily.     NURTEC 75 MG TBDP TAKE 1 TABLET BY MOUTH EVERY OTHER DAY 40 tablet 2   promethazine (PHENERGAN) 12.5 MG tablet Take 1 tablet (12.5 mg total) by mouth every 6 (six) hours as needed for nausea or vomiting. 30 tablet 1   QUVIVIQ 25 MG TABS Take 1 tablet by mouth at bedtime.     rosuvastatin (CRESTOR) 20 MG tablet TAKE 1 TABLET BY MOUTH EVERY DAY 90 tablet 1   spironolactone (ALDACTONE) 25 MG tablet TAKE 1 TABLET (25 MG TOTAL) BY MOUTH DAILY. 90 tablet 0   trihexyphenidyl (ARTANE) 2 MG tablet Take 1 tablet (2 mg total) by mouth daily. 90 tablet 1   zinc gluconate 50 MG tablet Take 50 mg by mouth daily.     budesonide-formoterol (SYMBICORT) 160-4.5 MCG/ACT inhaler Inhale 2 puffs into the lungs in the morning and at bedtime. 3 each 5   DULoxetine (CYMBALTA) 60 MG capsule Take 60 mg by mouth 2 (two) times daily at 10 AM and 5 PM. (Patient not taking: Reported on 05/20/2022)     montelukast (SINGULAIR) 10  MG tablet Take 1 tablet (10 mg total) by mouth at bedtime. 90 tablet 1   No facility-administered medications prior to visit.   Past Medical History:  Diagnosis Date   Allergic rhinitis    Anxiety    Benign essential tremor    hands   Bipolar 1 disorder, mixed, moderate    Claustrophobia    Depression    Eczema    Frequency of urination    History of frequent urinary tract infections    History of gastroesophageal reflux (GERD)    05-25-2017  per pt no issues since hiatal hernia repair 01/ 2018   History of hiatal hernia    History of melanoma excision    early 2000s-- BACK   History of panic attacks    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   Hypothyroidism    Intermittent palpitations    cardiology--  dr [place]jordan   Interstitial cystitis     Limited jaw range of motion    s/p  bilateral TMJ surgery,  age 6030s   Migraines    Moderate persistent asthma    pulmologist-- dr Craige Cottasood   OA (osteoarthritis)    both knees   OSA on CPAP    per last sleep study 09/ 2017  mild OSA, AHI13/hr   PONV (postoperative nausea and vomiting)    PVC (premature ventricular contraction)    Rosacea    Sensation of pressure in bladder area    per pt intermittant   TMJ arthralgia    Urticaria    Wears glasses    White coat syndrome without hypertension    05-25-2017  per pt hx hypertension yrs ago, no issues after quitting stressful job   Past Surgical History:  Procedure Laterality Date   24 HOUR PH STUDY N/A 09/22/2015   Procedure: 24 HOUR PH STUDY;  Surgeon: Sherrilyn RistHenry L Danis III, MD;  Location: [facility];  Service: Gastroenterology;  Laterality: N/A;   ADENOIDECTOMY     ANAL FISSURE REPAIR  age 67  21(1977)   BREAST EXCISIONAL BIOPSY Right 04-16-2003  dr p. young  [facility]   benign   BUNIONECTOMY Right early 2000s   CARPAL TUNNEL RELEASE Left age 67 73(1981)   CHOLECYSTECTOMY OPEN  age 67   CYSTO WITH HYDRODISTENSION N/A 06/02/2017   Procedure: CYSTOSCOPY/HYDRODISTENSION OF BLADDER;  Surgeon: Bjorn PippinWrenn, John, MD;  [facility];  Service: Urology;  Laterality: N/A;   CYSTO/ HYDRODISTENTION/  INSTILLATION THERAPY  1990s   DX LAPAROSCOPY/  DX HYSTEROSCOPY/  D & C  08-07-2007   [facility]   ENDOMETRIAL ABLATION W/ NOVASURE  10-21-2008   [facility]   ESOPHAGEAL MANOMETRY N/A 09/22/2015   Procedure: ESOPHAGEAL MANOMETRY (EM);  Surgeon: Sherrilyn RistHenry L Danis III, MD;  Location: [facility];  Service: Gastroenterology;  Laterality: N/A;  with impedence    FINGER ARTHROPLASTY Bilateral    left little finger 2016:  right middle finger 06/ 2018   KNEE ARTHROSCOPY Bilateral x3 left ;  x3  right-- last one age 67 46(1999)   NASAL SEPTUM SURGERY  age [age]s   ROBOT ASSISTED REDUCTION PARAESOPHAGEAL HIATAL HERNIA/ TYPE 2 MEDIASTINAL DISSECTION/ PRIMARY  HIATAL HERNIA REPAIR/ ANTERIOR & POSTERIOR GASTROPEXY/ NISSEN FUNDOPLICATION  03-04-2017   [facility]   SINOSCOPY     TEMPOROMANDIBULAR JOINT SURGERY Bilateral x3  -- age 31s   per pt used graft   TONSILLECTOMY     TOTAL  KNEE ARTHROPLASTY Left 12/26/2017   Procedure: LEFT TOTAL KNEE ARTHROPLASTY;  Surgeon: Ollen Gross, MD;  Location: [facility];  Service: Orthopedics;  Laterality: Left;    TRANSTHORACIC ECHOCARDIOGRAM  12-06-2017   dr [place]   ef 55-60%, grade 1 diastoilc dysfunction, trivial MR   TRIGGER FINGER RELEASE Bilateral last one 2017   several release's bilaterally   ULNAR NERVE TRANSPOSITION Bilateral age 62 (1980)   Allergies  Allergen Reactions   Emetrol Itching   Indomethacin Other (See Comments)    Muscle spasms Causes muscle spasms in neck   Iodinated Contrast Media Itching    Flushed and Fever, itch all over   Amlodipine Swelling    Peripheral edema   Carbamazepine Other (See Comments)    Easy and unusual bleeding    Pseudoephedrine Other (See Comments)    I fly off the walls  CANNOT SLEEP   Risperidone And Related Other (See Comments)    Stomach upset, insomnia, drooling, tremors, "jerks," sensitivity to touch.    Bupivacaine Hives   Pentazocine Other (See Comments)    Unknown reaction MADE ME "LACTATE"   Propoxyphene Itching and Nausea And Vomiting    darvocet   Sulfa Antibiotics Other (See Comments)    Unknown reaction   Tramadol Other (See Comments)    Patient can not remember   Aspirin Other (See Comments)    upset stomach, ringing in the ears   Codeine Itching and Other (See Comments)    Anything related to codeine   Etodolac Rash and Other (See Comments)   Propranolol Nausea Only and Other (See Comments)      Objective:    Physical Exam Vitals and nursing note reviewed.  Constitutional:      Appearance: Normal appearance. She is not ill-appearing.     Interventions: Face mask in place.  HENT:     Right Ear: Tympanic membrane  and ear canal normal.     Left Ear: Tympanic membrane and ear canal normal. There is impacted cerumen.     Nose:     Right Sinus: Frontal sinus tenderness present.     Left Sinus: Frontal sinus tenderness present.     Mouth/Throat:     Mouth: Mucous membranes are moist.     Pharynx: Posterior oropharyngeal erythema present. No pharyngeal swelling, oropharyngeal exudate or uvula swelling.     Tonsils: No tonsillar exudate or tonsillar abscesses.  Cardiovascular:     Rate and Rhythm: Normal rate and regular rhythm.  Pulmonary:     Effort: Pulmonary effort is normal.     Breath sounds: Normal breath sounds.  Musculoskeletal:        General: Normal range of motion.  Lymphadenopathy:     Head:     Right side of head: No preauricular or posterior auricular adenopathy.     Left side of head: No preauricular or posterior auricular adenopathy.     Cervical: No cervical adenopathy.  Skin:    General: Skin is warm and dry.  Neurological:     Mental Status: She is alert.  Psychiatric:        Mood and Affect: Mood normal.        Behavior: Behavior normal.    BP 118/78   Pulse 85   Temp 97.7 F (36.5 C) (Temporal)   Resp 16   Ht 4\' 11"  (1.499 m)   LMP 03/05/2012 (Approximate)   SpO2 96%   BMI 28.68 kg/m  Wt Readings from Last 3 Encounters:  03/25/22  142 lb (64.4 kg)  03/12/22 142 lb (64.4 kg)  03/02/22 150 lb (68 kg)       Dulce Sellar, NP

## 2022-05-28 DIAGNOSIS — G4733 Obstructive sleep apnea (adult) (pediatric): Secondary | ICD-10-CM | POA: Diagnosis not present

## 2022-06-03 ENCOUNTER — Other Ambulatory Visit: Payer: Self-pay | Admitting: *Deleted

## 2022-06-03 DIAGNOSIS — G4733 Obstructive sleep apnea (adult) (pediatric): Secondary | ICD-10-CM

## 2022-06-03 NOTE — Addendum Note (Signed)
Addended by: Wyvonne Lenz on: 06/03/2022 04:22 PM   Modules accepted: Orders

## 2022-06-10 ENCOUNTER — Ambulatory Visit: Payer: Medicare HMO | Admitting: Allergy & Immunology

## 2022-06-22 ENCOUNTER — Ambulatory Visit: Payer: Medicare HMO | Admitting: Allergy & Immunology

## 2022-06-27 DIAGNOSIS — G4733 Obstructive sleep apnea (adult) (pediatric): Secondary | ICD-10-CM | POA: Diagnosis not present

## 2022-06-30 DIAGNOSIS — N3941 Urge incontinence: Secondary | ICD-10-CM | POA: Diagnosis not present

## 2022-06-30 DIAGNOSIS — N3010 Interstitial cystitis (chronic) without hematuria: Secondary | ICD-10-CM | POA: Diagnosis not present

## 2022-07-01 ENCOUNTER — Encounter: Payer: Self-pay | Admitting: Internal Medicine

## 2022-07-01 ENCOUNTER — Ambulatory Visit (INDEPENDENT_AMBULATORY_CARE_PROVIDER_SITE_OTHER): Payer: Medicare HMO | Admitting: Internal Medicine

## 2022-07-01 VITALS — BP 128/78 | HR 100 | Temp 98.2°F | Resp 16 | Ht 59.0 in | Wt 154.0 lb

## 2022-07-01 DIAGNOSIS — I10 Essential (primary) hypertension: Secondary | ICD-10-CM | POA: Diagnosis not present

## 2022-07-01 DIAGNOSIS — D509 Iron deficiency anemia, unspecified: Secondary | ICD-10-CM | POA: Diagnosis not present

## 2022-07-01 DIAGNOSIS — E039 Hypothyroidism, unspecified: Secondary | ICD-10-CM

## 2022-07-01 DIAGNOSIS — Z0001 Encounter for general adult medical examination with abnormal findings: Secondary | ICD-10-CM

## 2022-07-01 DIAGNOSIS — K219 Gastro-esophageal reflux disease without esophagitis: Secondary | ICD-10-CM | POA: Diagnosis not present

## 2022-07-01 DIAGNOSIS — E876 Hypokalemia: Secondary | ICD-10-CM

## 2022-07-01 DIAGNOSIS — Z0000 Encounter for general adult medical examination without abnormal findings: Secondary | ICD-10-CM

## 2022-07-01 DIAGNOSIS — E785 Hyperlipidemia, unspecified: Secondary | ICD-10-CM

## 2022-07-01 DIAGNOSIS — Z1231 Encounter for screening mammogram for malignant neoplasm of breast: Secondary | ICD-10-CM

## 2022-07-01 LAB — LIPID PANEL
Cholesterol: 155 mg/dL (ref 0–200)
HDL: 68.4 mg/dL (ref >39.00)
LDL Cholesterol: 72 mg/dL (ref 0–99)
NonHDL: 86.7
Total CHOL/HDL Ratio: 2
Triglycerides: 75 mg/dL (ref 0.0–149.0)
VLDL: 15 mg/dL (ref 0.0–40.0)

## 2022-07-01 LAB — BASIC METABOLIC PANEL
BUN: 10 mg/dL (ref 6–23)
CO2: 30 mEq/L (ref 19–32)
Calcium: 9.8 mg/dL (ref 8.4–10.5)
Chloride: 103 mEq/L (ref 96–112)
Creatinine, Ser: 1.07 mg/dL (ref 0.40–1.20)
GFR: 54.46 mL/min — ABNORMAL LOW (ref >60.00)
Glucose, Bld: 129 mg/dL — ABNORMAL HIGH (ref 70–99)
Potassium: 4.3 mEq/L (ref 3.5–5.1)
Sodium: 141 mEq/L (ref 135–145)

## 2022-07-01 LAB — CBC WITH DIFFERENTIAL/PLATELET
Basophils Absolute: 0 10*3/uL (ref 0.0–0.1)
Basophils Relative: 0.5 % (ref 0.0–3.0)
Eosinophils Absolute: 0.2 10*3/uL (ref 0.0–0.7)
Eosinophils Relative: 1.8 % (ref 0.0–5.0)
HCT: 37.5 % (ref 36.0–46.0)
Hemoglobin: 12.2 g/dL (ref 12.0–15.0)
Lymphocytes Relative: 25.6 % (ref 12.0–46.0)
Lymphs Abs: 2.2 10*3/uL (ref 0.7–4.0)
MCHC: 32.4 g/dL (ref 30.0–36.0)
MCV: 90.5 fl (ref 78.0–100.0)
Monocytes Absolute: 0.6 10*3/uL (ref 0.1–1.0)
Monocytes Relative: 6.6 % (ref 3.0–12.0)
Neutro Abs: 5.6 10*3/uL (ref 1.4–7.7)
Neutrophils Relative %: 65.5 % (ref 43.0–77.0)
Platelets: 278 10*3/uL (ref 150.0–400.0)
RBC: 4.14 Mil/uL (ref 3.87–5.11)
RDW: 12.6 % (ref 11.5–15.5)
WBC: 8.6 10*3/uL (ref 4.0–10.5)

## 2022-07-01 LAB — IBC + FERRITIN
Ferritin: 16.9 ng/mL (ref 10.0–291.0)
Iron: 64 ug/dL (ref 42–145)
Saturation Ratios: 17.4 % — ABNORMAL LOW (ref 20.0–50.0)
TIBC: 368.2 ug/dL (ref 250.0–450.0)
Transferrin: 263 mg/dL (ref 212.0–360.0)

## 2022-07-01 LAB — TSH: TSH: 0.66 u[IU]/mL (ref 0.35–5.50)

## 2022-07-01 MED ORDER — SPIRONOLACTONE 25 MG PO TABS
25.0000 mg | ORAL_TABLET | Freq: Every day | ORAL | 0 refills | Status: DC
Start: 2022-07-01 — End: 2022-12-31

## 2022-07-01 NOTE — Patient Instructions (Signed)

## 2022-07-01 NOTE — Progress Notes (Signed)
Subjective:  Patient ID: [name], female    DOB: Sep 29, 1955  Age: 67 y.o. MRN: [id number]  CC: Annual Exam and Gastroesophageal Reflux   HPI [name] presents for a CPX and f/up ---   6 days ago she thinks she had an episode of melena.  She has chronic nausea but no recent vomiting.  She has an achy sensation in her stomach and intermittent heartburn.  She is getting symptom relief with Tums and Pepto-Bismol.  She denies odynophagia, dysphagia, loss of appetite, or weight loss.  Outpatient Medications Prior to Visit  Medication Sig Dispense Refill   albuterol (VENTOLIN HFA) 108 (90 Base) MCG/ACT inhaler INHALE 1-2 PUFFS INTO THE LUNGS DAILY AS NEEDED FOR WHEEZING OR SHORTNESS OF BREATH. 18 each 1   cariprazine (VRAYLAR) 3 MG capsule      dicyclomine (BENTYL) 10 MG capsule Take 1 capsule (10 mg total) by mouth 3 (three) times daily before meals. 90 capsule 2   DULoxetine (CYMBALTA) 60 MG capsule Take 60 mg by mouth 2 (two) times daily at 10 AM and 5 PM.     fluticasone (FLONASE) 50 MCG/ACT nasal spray Place 1 spray into both nostrils 2 (two) times daily as needed for allergies or rhinitis. 48 mL 1   hydrocortisone 2.5 % ointment Apply topically 2 (two) times daily.     levothyroxine (SYNTHROID) 25 MCG tablet TAKE 1 TABLET BY MOUTH EVERY DAY 90 tablet 1   metoprolol tartrate (LOPRESSOR) 25 MG tablet Take 1 tablet (25 mg total) by mouth 2 (two) times daily. 180 tablet 0   MYRBETRIQ 50 MG TB24 tablet Take 50 mg by mouth daily.     NURTEC 75 MG TBDP TAKE 1 TABLET BY MOUTH EVERY OTHER DAY 40 tablet 2   QUVIVIQ 25 MG TABS Take 1 tablet by mouth at bedtime.     rosuvastatin (CRESTOR) 20 MG tablet TAKE 1 TABLET BY MOUTH EVERY DAY 90 tablet 1   trihexyphenidyl (ARTANE) 2 MG tablet Take 1 tablet (2 mg total) by mouth daily. 90 tablet 1   zinc gluconate 50 MG tablet Take 50 mg by mouth daily.     spironolactone (ALDACTONE) 25 MG tablet TAKE 1 TABLET (25 MG TOTAL) BY MOUTH DAILY. 90  tablet 0   Azelastine HCl 0.15 % SOLN Place 1-2 sprays into both nostrils 2 (two) times daily. 30 mL 5   budesonide-formoterol (SYMBICORT) 160-4.5 MCG/ACT inhaler Inhale 2 puffs into the lungs in the morning and at bedtime. 3 each 5   Carbinoxamine Maleate 4 MG TABS Take 1 tablet (4 mg total) by mouth every 6 (six) hours. 120 tablet 5   montelukast (SINGULAIR) 10 MG tablet Take 1 tablet (10 mg total) by mouth at bedtime. 90 tablet 1   amoxicillin-clavulanate (AUGMENTIN) 875-125 MG tablet Take 1 tablet by mouth 2 (two) times daily after a meal. 14 tablet 0   clonazePAM (KLONOPIN) 0.5 MG tablet Take 0.5 mg by mouth 2 (two) times daily as needed.     ketoconazole (NIZORAL) 2 % cream Apply topically daily.      promethazine (PHENERGAN) 12.5 MG tablet Take 1 tablet (12.5 mg total) by mouth every 6 (six) hours as needed for nausea or vomiting. 30 tablet 1   No facility-administered medications prior to visit.    ROS Review of Systems  Constitutional:  Positive for unexpected weight change (wt gain). Negative for appetite change, chills, diaphoresis and fatigue.  HENT: Negative.    Eyes: Negative.  Respiratory:  Negative for cough, chest tightness, shortness of breath and wheezing.   Cardiovascular:  Negative for chest pain, palpitations and leg swelling.  Gastrointestinal:  Positive for abdominal pain and nausea. Negative for blood in stool, constipation, diarrhea, rectal pain and vomiting.  Endocrine: Negative.   Genitourinary: Negative.  Negative for difficulty urinating.  Musculoskeletal: Negative.  Negative for arthralgias and myalgias.  Skin: Negative.  Negative for color change and pallor.  Neurological:  Negative for dizziness, weakness and light-headedness.  Hematological:  Negative for adenopathy. Does not bruise/bleed easily.  Psychiatric/Behavioral: Negative.      Objective:  BP 128/78 (BP Location: Left Arm, Patient Position: Sitting, Cuff Size: Large)   Pulse 100   Temp 98.2  F (36.8 C) (Oral)   Resp 16   Ht 4\' 11"  (1.499 m)   Wt 154 lb (69.9 kg)   LMP 03/05/2012 (Approximate)   SpO2 92%   BMI 31.10 kg/m   BP Readings from Last 3 Encounters:  07/01/22 128/78  05/20/22 118/78  03/12/22 112/64    Wt Readings from Last 3 Encounters:  07/01/22 154 lb (69.9 kg)  03/25/22 142 lb (64.4 kg)  03/12/22 142 lb (64.4 kg)    Physical Exam Vitals reviewed. Exam conducted with a chaperone present (Shirron).  Constitutional:      Appearance: Normal appearance.  HENT:     Nose: Nose normal.     Mouth/Throat:     Mouth: Mucous membranes are moist.  Eyes:     General: No scleral icterus.    Conjunctiva/sclera: Conjunctivae normal.  Cardiovascular:     Rate and Rhythm: Normal rate and regular rhythm.     Heart sounds: No murmur heard.    No friction rub. No gallop.  Pulmonary:     Effort: Pulmonary effort is normal.     Breath sounds: No stridor. No wheezing, rhonchi or rales.  Abdominal:     General: Abdomen is protuberant. Bowel sounds are normal. There is no distension.     Palpations: Abdomen is soft. There is no hepatomegaly, splenomegaly or mass.     Tenderness: There is no abdominal tenderness. There is no guarding or rebound.     Hernia: No hernia is present.  Genitourinary:    Rectum: Guaiac result negative. No mass, tenderness, anal fissure, external hemorrhoid or internal hemorrhoid. Normal anal tone.  Musculoskeletal:        General: Normal range of motion.     Cervical back: Neck supple.     Right lower leg: No edema.     Left lower leg: No edema.  Lymphadenopathy:     Cervical: No cervical adenopathy.  Skin:    General: Skin is warm and dry.     Coloration: Skin is not pale.  Neurological:     General: No focal deficit present.     Mental Status: She is alert. Mental status is at baseline.  Psychiatric:        Mood and Affect: Mood normal.        Behavior: Behavior normal.        Thought Content: Thought content normal.         Judgment: Judgment normal.     Lab Results  Component Value Date   WBC 8.6 07/01/2022   HGB 12.2 07/01/2022   HCT 37.5 07/01/2022   PLT 278.0 07/01/2022   GLUCOSE 129 (H) 07/01/2022   CHOL 155 07/01/2022   TRIG 75.0 07/01/2022   HDL 68.40 07/01/2022   LDLDIRECT 143.0  09/14/2016   LDLCALC 72 07/01/2022   ALT 42 (H) 03/02/2022   AST 28 03/02/2022   NA 141 07/01/2022   K 4.3 07/01/2022   CL 103 07/01/2022   CREATININE 1.07 07/01/2022   BUN 10 07/01/2022   CO2 30 07/01/2022   TSH 0.66 07/01/2022   INR 0.94 12/21/2017   HGBA1C 6.3 03/02/2022   MICROALBUR 1.3 08/24/2021    MR SHOULDER RIGHT WO CONTRAST  Result Date: 11/12/2021 CLINICAL DATA:  Shoulder pain. Rotator cuff disorder suspected. Evaluate for rotator cuff tear. EXAM: MRI OF THE RIGHT SHOULDER WITHOUT CONTRAST TECHNIQUE: Multiplanar, multisequence MR imaging of the shoulder was performed. No intravenous contrast was administered. COMPARISON:  Right shoulder radiographs 08/26/2021 FINDINGS: Rotator cuff: There is fluid bright signal extending into a partial-thickness tear of the anterior supraspinatus bursal sided tendon fibers measuring up to 12 mm in AP dimension (sagittal series 9, image 13) and involving up to 50% of the transverse tendon dimension (coronal series 12 images 11 and 12). Up to approximately 1.3 cm tendon retraction. The infraspinatus, subscapularis, and teres minor are intact. Muscles:  Mild anterior supraspinatus muscle atrophy. Biceps long head: The intra-articular long head of the biceps tendon is intact. Acromioclavicular Joint: There are mild degenerative changes of the acromioclavicular joint including joint space narrowing, subchondral marrow edema, and peripheral osteophytosis. Type II acromion. Mild-to-moderate fluid within the subacromial/subdeltoid bursa. There is a low signal loose body measuring up to 11 mm within the anterior aspect of the subacromial/subdeltoid bursa, just inferior to the lateral  clavicular metaphysis (coronal image 11, sagittal image 5, and axial image 5). Additional punctate adjacent 2 mm loose body slightly more laterally (axial image 5). Glenohumeral Joint: Diffuse high-grade partial and full-thickness cartilage loss throughout the glenoid and humeral head, greatest within the superomedial aspect of the humeral head in the anterior glenoid. Labrum: Moderate attenuation and degenerative irregularity of the posterosuperior glenoid labrum. Bones:  No acute fracture. Other: None. IMPRESSION: 1. Partial-thickness bursal sided tear of the supraspinatus tendon measuring up to 12 mm in AP dimension and involving up to 50% of the transverse tendon dimension. 2. Mild anterior supraspinatus muscle atrophy. 3. Mild degenerative changes of the acromioclavicular joint. 4. Mild-to-moderate subacromial/subdeltoid bursitis. 11 mm loose body within the anterior aspect of the subacromial/subdeltoid bursa, just inferior to the lateral clavicle. 5. Moderate glenohumeral osteoarthritis. Electronically Signed   By: Neita Garnet M.D.   On: 11/12/2021 09:17    Assessment & Plan:  Encounter for general adult medical examination with abnormal findings- Exam completed, labs reviewed, vaccines are up-to-date, cancer screenings addressed, patient education was given.  Essential hypertension- Her BP is well controlled. -     Basic metabolic panel; Future -     Spironolactone; Take 1 tablet (25 mg total) by mouth daily.  Dispense: 90 tablet; Refill: 0  Gastroesophageal reflux disease without esophagitis- There is no evidence of GI blood loss.  She prefers to continue taking Pepto-Bismol and Tums. -     CBC with Differential/Platelet; Future  Hyperlipidemia LDL goal <100- LDL goal achieved. Doing well on the statin  -     Lipid panel; Future  Hypothyroidism, unspecified type- She is euthyroid. -     TSH; Future  Screening mammogram for breast cancer -     3D Screening Mammogram, Left and Right;  Future  Iron deficiency anemia, unspecified iron deficiency anemia type- Her H&H and iron levels are normal. -     IBC + Ferritin; Future -  CBC with Differential/Platelet; Future     Follow-up: Return in about 6 months (around 01/01/2023).  Sanda Linger, MD

## 2022-07-07 ENCOUNTER — Telehealth: Payer: Self-pay | Admitting: Internal Medicine

## 2022-07-07 ENCOUNTER — Other Ambulatory Visit: Payer: Self-pay | Admitting: Internal Medicine

## 2022-07-07 NOTE — Telephone Encounter (Signed)
Pt called wanting a nurse to go over lab results with pt. Please advise.

## 2022-07-07 NOTE — Telephone Encounter (Signed)
Per 5/23 letter:  "Your kidney function has declined slightly. Your blood sugar was mildly elevated. The other labs were all normal."   Pt has been informed and expressed understanding. She would like to know what would cause her kidney function to decrease. Please advise.

## 2022-07-08 ENCOUNTER — Other Ambulatory Visit: Payer: Self-pay | Admitting: Allergy & Immunology

## 2022-07-09 ENCOUNTER — Other Ambulatory Visit: Payer: Self-pay | Admitting: Internal Medicine

## 2022-07-09 DIAGNOSIS — I5189 Other ill-defined heart diseases: Secondary | ICD-10-CM

## 2022-07-09 DIAGNOSIS — N1831 Chronic kidney disease, stage 3a: Secondary | ICD-10-CM

## 2022-07-09 MED ORDER — EMPAGLIFLOZIN 10 MG PO TABS
10.0000 mg | ORAL_TABLET | Freq: Every day | ORAL | 1 refills | Status: DC
Start: 2022-07-09 — End: 2023-06-21

## 2022-07-09 MED ORDER — EMPAGLIFLOZIN 10 MG PO TABS
10.0000 mg | ORAL_TABLET | Freq: Every day | ORAL | 1 refills | Status: DC
Start: 2022-07-09 — End: 2022-07-09

## 2022-07-09 NOTE — Telephone Encounter (Signed)
LVM informing pt to starting taking Jardiance to help with her kidneys.

## 2022-07-09 NOTE — Progress Notes (Unsigned)
Subjective:  Patient ID: [name], female    DOB: 03-06-1955  Age: 67 y.o. MRN: [id number]  CC: No chief complaint on file.   HPI YAKISHA DETORRES presents for ***  Outpatient Medications Prior to Visit  Medication Sig Dispense Refill   albuterol (VENTOLIN HFA) 108 (90 Base) MCG/ACT inhaler INHALE 1-2 PUFFS INTO THE LUNGS DAILY AS NEEDED FOR WHEEZING OR SHORTNESS OF BREATH. 18 each 1   Azelastine HCl 0.15 % SOLN Place 1-2 sprays into both nostrils 2 (two) times daily. 30 mL 5   budesonide-formoterol (SYMBICORT) 160-4.5 MCG/ACT inhaler Inhale 2 puffs into the lungs in the morning and at bedtime. 3 each 5   Carbinoxamine Maleate 4 MG TABS Take 1 tablet (4 mg total) by mouth every 6 (six) hours. 120 tablet 5   cariprazine (VRAYLAR) 3 MG capsule      dicyclomine (BENTYL) 10 MG capsule Take 1 capsule (10 mg total) by mouth 3 (three) times daily before meals. 90 capsule 2   DULoxetine (CYMBALTA) 60 MG capsule Take 60 mg by mouth 2 (two) times daily at 10 AM and 5 PM.     fluticasone (FLONASE) 50 MCG/ACT nasal spray Place 1 spray into both nostrils 2 (two) times daily as needed for allergies or rhinitis. 48 mL 1   hydrocortisone 2.5 % ointment Apply topically 2 (two) times daily.     levothyroxine (SYNTHROID) 25 MCG tablet TAKE 1 TABLET BY MOUTH EVERY DAY 90 tablet 1   metoprolol tartrate (LOPRESSOR) 25 MG tablet Take 1 tablet (25 mg total) by mouth 2 (two) times daily. 180 tablet 0   montelukast (SINGULAIR) 10 MG tablet Take 1 tablet (10 mg total) by mouth at bedtime. 90 tablet 1   MYRBETRIQ 50 MG TB24 tablet Take 50 mg by mouth daily.     NURTEC 75 MG TBDP TAKE 1 TABLET BY MOUTH EVERY OTHER DAY 40 tablet 2   QUVIVIQ 25 MG TABS Take 1 tablet by mouth at bedtime.     rosuvastatin (CRESTOR) 20 MG tablet TAKE 1 TABLET BY MOUTH EVERY DAY 90 tablet 1   spironolactone (ALDACTONE) 25 MG tablet Take 1 tablet (25 mg total) by mouth daily. 90 tablet 0   trihexyphenidyl (ARTANE) 2 MG tablet Take 1  tablet (2 mg total) by mouth daily. 90 tablet 1   zinc gluconate 50 MG tablet Take 50 mg by mouth daily.     No facility-administered medications prior to visit.    ROS Review of Systems  Objective:  LMP 03/05/2012 (Approximate)   BP Readings from Last 3 Encounters:  07/01/22 128/78  05/20/22 118/78  03/12/22 112/64    Wt Readings from Last 3 Encounters:  07/01/22 154 lb (69.9 kg)  03/25/22 142 lb (64.4 kg)  03/12/22 142 lb (64.4 kg)    Physical Exam  Lab Results  Component Value Date   WBC 8.6 07/01/2022   HGB 12.2 07/01/2022   HCT 37.5 07/01/2022   PLT 278.0 07/01/2022   GLUCOSE 129 (H) 07/01/2022   CHOL 155 07/01/2022   TRIG 75.0 07/01/2022   HDL 68.40 07/01/2022   LDLDIRECT 143.0 09/14/2016   LDLCALC 72 07/01/2022   ALT 42 (H) 03/02/2022   AST 28 03/02/2022   NA 141 07/01/2022   K 4.3 07/01/2022   CL 103 07/01/2022   CREATININE 1.07 07/01/2022   BUN 10 07/01/2022   CO2 30 07/01/2022   TSH 0.66 07/01/2022   INR 0.94 12/21/2017   HGBA1C 6.3 03/02/2022  MICROALBUR 1.3 08/24/2021    MR SHOULDER RIGHT WO CONTRAST  Result Date: 11/12/2021 CLINICAL DATA:  Shoulder pain. Rotator cuff disorder suspected. Evaluate for rotator cuff tear. EXAM: MRI OF THE RIGHT SHOULDER WITHOUT CONTRAST TECHNIQUE: Multiplanar, multisequence MR imaging of the shoulder was performed. No intravenous contrast was administered. COMPARISON:  Right shoulder radiographs 08/26/2021 FINDINGS: Rotator cuff: There is fluid bright signal extending into a partial-thickness tear of the anterior supraspinatus bursal sided tendon fibers measuring up to 12 mm in AP dimension (sagittal series 9, image 13) and involving up to 50% of the transverse tendon dimension (coronal series 12 images 11 and 12). Up to approximately 1.3 cm tendon retraction. The infraspinatus, subscapularis, and teres minor are intact. Muscles:  Mild anterior supraspinatus muscle atrophy. Biceps long head: The intra-articular long  head of the biceps tendon is intact. Acromioclavicular Joint: There are mild degenerative changes of the acromioclavicular joint including joint space narrowing, subchondral marrow edema, and peripheral osteophytosis. Type II acromion. Mild-to-moderate fluid within the subacromial/subdeltoid bursa. There is a low signal loose body measuring up to 11 mm within the anterior aspect of the subacromial/subdeltoid bursa, just inferior to the lateral clavicular metaphysis (coronal image 11, sagittal image 5, and axial image 5). Additional punctate adjacent 2 mm loose body slightly more laterally (axial image 5). Glenohumeral Joint: Diffuse high-grade partial and full-thickness cartilage loss throughout the glenoid and humeral head, greatest within the superomedial aspect of the humeral head in the anterior glenoid. Labrum: Moderate attenuation and degenerative irregularity of the posterosuperior glenoid labrum. Bones:  No acute fracture. Other: None. IMPRESSION: 1. Partial-thickness bursal sided tear of the supraspinatus tendon measuring up to 12 mm in AP dimension and involving up to 50% of the transverse tendon dimension. 2. Mild anterior supraspinatus muscle atrophy. 3. Mild degenerative changes of the acromioclavicular joint. 4. Mild-to-moderate subacromial/subdeltoid bursitis. 11 mm loose body within the anterior aspect of the subacromial/subdeltoid bursa, just inferior to the lateral clavicle. 5. Moderate glenohumeral osteoarthritis. Electronically Signed   By: Neita Garnet M.D.   On: 11/12/2021 09:17    Assessment & Plan:  Type 2 diabetes mellitus with stage 3a chronic kidney disease, without long-term current use of insulin (HCC)     Follow-up: No follow-ups on file.  Sanda Linger, MD

## 2022-07-16 ENCOUNTER — Telehealth: Payer: Self-pay | Admitting: Internal Medicine

## 2022-07-16 NOTE — Telephone Encounter (Signed)
Patient is concerned about taking jardiance because of her pancreas issues.  Please call patient to discuss this.  Phone:  [phone]

## 2022-07-19 NOTE — Telephone Encounter (Signed)
Pt stated the she read over the Jardiance information and it states that if you have had pancreas issues or pancreatitis you should not take this medication. She mentioned that she had an issue when she was 67yrs old. She would like to know PCPs recommendations now that he knows that information.   Please advise.

## 2022-07-21 NOTE — Telephone Encounter (Signed)
Patient called back and would like someone to call her.

## 2022-07-22 ENCOUNTER — Ambulatory Visit
Admission: RE | Admit: 2022-07-22 | Discharge: 2022-07-22 | Disposition: A | Discharge status: Home / Self Care | Payer: Medicare HMO | Source: Ambulatory Visit | Attending: Internal Medicine | Admitting: Internal Medicine

## 2022-07-22 DIAGNOSIS — Z1231 Encounter for screening mammogram for malignant neoplasm of breast: Secondary | ICD-10-CM

## 2022-07-22 NOTE — Telephone Encounter (Signed)
Called pt, LVM.   

## 2022-07-22 NOTE — Telephone Encounter (Signed)
Pt has been informed PCP response and expressed understanding.

## 2022-07-28 DIAGNOSIS — F3132 Bipolar disorder, current episode depressed, moderate: Secondary | ICD-10-CM | POA: Diagnosis not present

## 2022-07-28 DIAGNOSIS — G4733 Obstructive sleep apnea (adult) (pediatric): Secondary | ICD-10-CM | POA: Diagnosis not present

## 2022-07-29 ENCOUNTER — Other Ambulatory Visit: Payer: Self-pay

## 2022-07-29 ENCOUNTER — Encounter: Payer: Self-pay | Admitting: Allergy & Immunology

## 2022-07-29 ENCOUNTER — Ambulatory Visit: Payer: Medicare HMO | Admitting: Allergy & Immunology

## 2022-07-29 VITALS — BP 130/78 | HR 78 | Temp 98.0°F | Resp 16 | Ht 58.66 in | Wt 160.0 lb

## 2022-07-29 DIAGNOSIS — J383 Other diseases of vocal cords: Secondary | ICD-10-CM | POA: Diagnosis not present

## 2022-07-29 DIAGNOSIS — J069 Acute upper respiratory infection, unspecified: Secondary | ICD-10-CM

## 2022-07-29 DIAGNOSIS — J302 Other seasonal allergic rhinitis: Secondary | ICD-10-CM

## 2022-07-29 DIAGNOSIS — J4540 Moderate persistent asthma, uncomplicated: Secondary | ICD-10-CM

## 2022-07-29 DIAGNOSIS — J3089 Other allergic rhinitis: Secondary | ICD-10-CM | POA: Diagnosis not present

## 2022-07-29 MED ORDER — MONTELUKAST SODIUM 10 MG PO TABS: 10.0000 mg | ORAL_TABLET | Freq: Every day | ORAL | 1 refills | Status: DC

## 2022-07-29 MED ORDER — CARBINOXAMINE MALEATE 4 MG PO TABS: 1.0000 | ORAL_TABLET | Freq: Four times a day (QID) | ORAL | 5 refills | Status: DC

## 2022-07-29 MED ORDER — BUDESONIDE-FORMOTEROL FUMARATE 160-4.5 MCG/ACT IN AERO: 2.0000 | INHALATION_SPRAY | Freq: Two times a day (BID) | RESPIRATORY_TRACT | 5 refills | Status: DC

## 2022-07-29 MED ORDER — AZELASTINE HCL 0.15 % NA SOLN: 1.0000 | Freq: Two times a day (BID) | NASAL | 5 refills | Status: DC

## 2022-07-29 MED ORDER — ALBUTEROL SULFATE HFA 108 (90 BASE) MCG/ACT IN AERS: 1.0000 | INHALATION_SPRAY | Freq: Every day | RESPIRATORY_TRACT | 1 refills | Status: DC | PRN

## 2022-07-29 MED ORDER — FLUTICASONE PROPIONATE 50 MCG/ACT NA SUSP: 1.0000 | Freq: Two times a day (BID) | NASAL | 1 refills | Status: DC | PRN

## 2022-07-29 NOTE — Progress Notes (Signed)
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 Moderate persistent asthma, uncomplicated - Spirometry looked fairly good. - We are going to give you a sample of Breztri to use instead of Symbicort. - Breztri contains Symbicort as well as a third medication to help loosen your lungs.  - We will go ahead and send this in since you are not paying anything for your medications currently.  - Daily controller medication(s): Singulair 10mg  daily and Breztri 2 puffs twice daily with spacer - Prior to physical activity: albuterol 2 puffs 10-15 minutes before physical activity. - Rescue medications: albuterol 4 puffs every 4-6 hours as needed - Changes during respiratory infections or worsening symptoms: Add on Flovent to 4 puffs  2 times daily for TWO WEEKS. - Asthma control goals:  * Full participation in all desired activities (may need albuterol before activity) * Albuterol use two time or less a week on average (not counting use with activity) * Cough interfering with sleep two time or less a month * Oral steroids no more than once a year * No hospitalizations  2. Seasonal and perennial allergic rhinitis - Continue with Singulair 10mg  once daily. - Continue with carbinoxamine up to three times daily. - Continue with azelastine one spray per nostril up to TWICE as needed depending on your symptoms.  - Continue with Pataday eye drops twice daily as needed.  3. Return in about 3 months (around 10/29/2022) since we are making medication changes today.    Subjective:   [name] is a 67 y.o. 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, without long-term current use of insulin (HCC) 07/09/2022   Chronic hyperglycemia 03/02/2022   Hyperlipidemia LDL goal <100 08/24/2021   Encounter for general adult medical examination with abnormal findings 03/14/2021   Estrogen deficiency 03/09/2021   Cervical cancer screening 03/09/2021   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   Grade I diastolic dysfunction 03/12/2020   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   Osteoarthritis of glenohumeral joint 03/26/2019   Iron deficiency anemia 04/28/2018   OA (osteoarthritis) of knee 12/26/2017   PVC (premature ventricular contraction) 11/21/2017   Vitamin D deficiency, unspecified 10/13/2016   Primary osteoarthritis of left hand 06/30/2016   Osteoporosis without current pathological fracture 03/22/2016   Paraesophageal hiatal hernia s/p robotic repair & fundoplication 03/04/2016 03/04/2016   Bipolar 1 disorder, mixed, moderate (HCC) 01/29/2016   Class 1 obesity with body mass index (BMI) of 32.0 to 32.9 in adult 01/06/2016   Migraines 11/21/2014   OSA on CPAP 01/30/2007   GERD 11/02/2006   Hypothyroidism 10/07/2006   Bipolar affective (HCC) 10/07/2006   Essential hypertension 10/07/2006   RHINITIS, CHRONIC 10/07/2006   Seasonal and perennial allergic rhinitis 10/07/2006   Moderate persistent asthma, uncomplicated 10/07/2006   Irritable bowel syndrome 10/07/2006   INTERSTITIAL CYSTITIS 10/07/2006   ROSACEA  10/07/2006   Osteoarthritis of both knees 10/07/2006    History obtained from: chart review and patient.  [name] is a 67 y.o. female presenting for a follow up visit.  She was last seen in November 2023.  At that time, her spirometry looked great.  We continue  with Singulair as well as Symbicort 160 mcg 2 puffs twice daily.  For her allergic rhinitis, we continue with Singulair as well as carbinoxamine and Astelin.  Since last visit, she has done well. She did wake up feeling badly this morning. She did feel better by lunch time or shortly thereafter. She has not had a fever at all to her knowledge. She has just had symptoms today. She does not have cold medicine at home.   One of her favorite cousins recently passed away. She is very broken up over this.   Asthma/Respiratory Symptom History: She remains on the Symbicort two puffs twice daily. She feels that she sometimes feels that she is having a hard time getting a deep breath. She has a lot of personal things going on, including losing a cousin.  She is using her emergency inhaler 1-2 times per week.   Allergic Rhinitis Symptom History: She has two cats in her home. She is wearing a colorful cat shirt today.  Her cats have a temper. They definitely seem to have strong personalities. She remains on the carbinoxamine up to TID which works well. She is also using her montelukast on a routine basis. She has the Astelin nose spray and the Pataday eye drops to use as needed. She has not needed any antibiotics for any of her symptoms recently.   Otherwise, there have been no changes to her past medical history, surgical history, family history, or social history.    Review of Systems  Constitutional: Negative.  Negative for chills, fever, malaise/fatigue and weight loss.  HENT: Negative.  Negative for congestion, ear discharge, ear pain and sinus pain.   Eyes:  Negative for pain, discharge and redness.  Respiratory:  Negative for cough, sputum production, shortness of breath, wheezing and stridor.   Cardiovascular: Negative.  Negative for chest pain and palpitations.  Gastrointestinal:  Negative for abdominal pain, constipation, diarrhea, heartburn, nausea and vomiting.  Skin: Negative.  Negative for  itching and rash.  Neurological:  Negative for dizziness and headaches.  Endo/Heme/Allergies:  Negative for environmental allergies. Does not bruise/bleed easily.       Objective:   Blood pressure 130/78, pulse 78, temperature 98 F (36.7 C), resp. rate 16, height 4' 10.66" (1.49 m), weight 160 lb (72.6 kg), last menstrual period 03/05/2012, SpO2 98 %. Body mass index is 32.69 kg/m.    Physical Exam Vitals reviewed.  Constitutional:      Appearance: She is well-developed.     Comments: Very pleasant.  Talkative. Seems somewhat more down today compared to previous visits.   HENT:     Head: Normocephalic and atraumatic.     Right Ear: Tympanic membrane, ear canal and external ear normal.     Left Ear: Tympanic membrane, ear canal and external ear normal.     Nose: No nasal deformity, septal deviation, mucosal edema or rhinorrhea.     Right Turbinates: Enlarged, swollen and pale.     Left Turbinates: Enlarged, swollen and pale.     Right Sinus: No maxillary sinus tenderness or frontal sinus tenderness.     Left Sinus: No maxillary sinus tenderness or frontal sinus tenderness.     Mouth/Throat:  Lips: Pink.     Mouth: Mucous membranes are moist. Mucous membranes are not pale and not dry.     Pharynx: Uvula midline.  Eyes:     General: Lids are normal. No allergic shiner.       Right eye: No discharge.        Left eye: No discharge.     Conjunctiva/sclera: Conjunctivae normal.     Right eye: Right conjunctiva is not injected. No chemosis.    Left eye: Left conjunctiva is not injected. No chemosis.    Pupils: Pupils are equal, round, and reactive to light.  Cardiovascular:     Rate and Rhythm: Normal rate and regular rhythm.     Heart sounds: Normal heart sounds.  Pulmonary:     Effort: Pulmonary effort is normal. No tachypnea, accessory muscle usage or respiratory distress.     Breath sounds: Normal breath sounds. No wheezing, rhonchi or rales.     Comments: Moving air  well in all lung fields.  No increased work of breathing. No crackles noted.  Chest:     Chest wall: No tenderness.  Lymphadenopathy:     Cervical: No cervical adenopathy.  Skin:    General: Skin is warm.     Capillary Refill: Capillary refill takes less than 2 seconds.     Coloration: Skin is not pale.     Findings: No abrasion, erythema, petechiae or rash. Rash is not papular, urticarial or vesicular.     Comments: She does have the resolving lesion on her left lower extremity.  There is no erythema or discharge.  Neurological:     Mental Status: She is alert.  Psychiatric:        Behavior: Behavior is cooperative.      Diagnostic studies:    Spirometry: results normal (FEV1: 2.42/128%, FVC: 2.89/121%, FEV1/FVC: 84%).    Spirometry consistent with normal pattern.   Allergy Studies: none       Malachi Bonds, MD  [facility]

## 2022-07-29 NOTE — Patient Instructions (Addendum)
1. Moderate persistent asthma, uncomplicated - Spirometry looked fairly good. - We are going to give you a sample of Breztri to use instead of Symbicort. - Breztri contains Symbicort as well as a third medication to help loosen your lungs.  - We will go ahead and send this in since you are not paying anything for your medications currently.  - Daily controller medication(s): Singulair 10mg  daily and Breztri 2 puffs twice daily with spacer - Prior to physical activity: albuterol 2 puffs 10-15 minutes before physical activity. - Rescue medications: albuterol 4 puffs every 4-6 hours as needed - Changes during respiratory infections or worsening symptoms: Add on Flovent to 4 puffs  2 times daily for TWO WEEKS. - Asthma control goals:  * Full participation in all desired activities (may need albuterol before activity) * Albuterol use two time or less a week on average (not counting use with activity) * Cough interfering with sleep two time or less a month * Oral steroids no more than once a year * No hospitalizations  2. Seasonal and perennial allergic rhinitis - Continue with Singulair 10mg  once daily. - Continue with carbinoxamine up to three times daily. - Continue with azelastine one spray per nostril up to TWICE as needed depending on your symptoms.  - Continue with Pataday eye drops twice daily as needed.  3. Return in about 3 months (around 10/29/2022) since we are making medication changes today.    Please inform us of any Emergency Department visits, hospitalizations, or changes in symptoms. Call us before going to the ED for breathing or allergy symptoms since we might be able to fit you in for a sick visit. Feel free to contact us anytime with any questions, problems, or concerns.  It was a pleasure to see you again today!   Websites that have reliable patient information: 1. American Academy of Asthma, Allergy, and Immunology: www.aaaai.org 2. Food Allergy Research  and Education (FARE): foodallergy.org 3. Mothers of Asthmatics: http://www.asthmacommunitynetwork.org 4. American College of Allergy, Asthma, and Immunology: www.acaai.org   COVID-19 Vaccine Information can be found at: [email or web address].[email or web address] For questions related to vaccine distribution or appointments, please email [email or web address] or call [phone].   We realize that you might be concerned about having an allergic reaction to the COVID19 vaccines. To help with that concern, WE ARE OFFERING THE COVID19 VACCINES IN OUR OFFICE! Ask the front desk for dates!     "Like" [place] on Facebook and Instagram for our latest updates!      A healthy democracy works best when [employer] participate! Make sure you are registered to vote! If you have moved or changed any of your contact information, you will need to get this updated before voting!  In some cases, you MAY be able to register to vote online: [email or web address]

## 2022-07-31 ENCOUNTER — Encounter: Payer: Self-pay | Admitting: Allergy & Immunology

## 2022-08-02 ENCOUNTER — Ambulatory Visit: Payer: Medicare HMO | Admitting: Orthopaedic Surgery

## 2022-08-02 ENCOUNTER — Other Ambulatory Visit: Payer: Self-pay

## 2022-08-02 ENCOUNTER — Encounter: Payer: Self-pay | Admitting: Orthopaedic Surgery

## 2022-08-02 DIAGNOSIS — G8929 Other chronic pain: Secondary | ICD-10-CM

## 2022-08-02 DIAGNOSIS — M25511 Pain in right shoulder: Secondary | ICD-10-CM | POA: Diagnosis not present

## 2022-08-02 DIAGNOSIS — M25512 Pain in left shoulder: Secondary | ICD-10-CM | POA: Diagnosis not present

## 2022-08-02 MED ORDER — METHYLPREDNISOLONE ACETATE 40 MG/ML IJ SUSP
40.0000 mg | INTRAMUSCULAR | Status: AC | PRN
Start: 2022-08-02 — End: 2022-08-02
  Administered 2022-08-02: 40 mg via INTRA_ARTICULAR

## 2022-08-02 MED ORDER — LIDOCAINE HCL 1 % IJ SOLN
5.0000 mL | INTRAMUSCULAR | Status: AC | PRN
Start: 2022-08-02 — End: 2022-08-02
  Administered 2022-08-02: 5 mL

## 2022-08-02 MED ORDER — TIZANIDINE HCL 2 MG PO TABS: 2.0000 mg | ORAL_TABLET | Freq: Two times a day (BID) | ORAL | 0 refills | Status: DC | PRN

## 2022-08-02 NOTE — Progress Notes (Signed)
The patient is very well-known to me.  She is an active 67 year old female we have seen her for her right shoulder before.  Back in October of last year and a MRI of her shoulder showed some bursitis within that shoulder and moderate glenohumeral arthritis.  There was also some partial tearing of the rotator cuff.  We did perform arthroscopic surgery in early November of last year with her left shoulder and she had gotten better.  After a series of steroid injections and physical therapy and that shoulder got significantly better as well.  She comes in today saying she is got a lot of muscle pain with that right shoulder but now her left shoulder is hurting with significant muscle pain and just worse pain overall in the right shoulder.  She has never had surgery on the left shoulder and she has not had any type of injections in the left shoulder.  Both of them are waking her up at night.  On exam she can abduct both her shoulders and rotate them but they are very painful throughout the arc of motion.  On my exam there is no blocks to rotation and the rotator cuff seem to be functioning appropriately but she does have a lot of pain in both shoulders with evidence of impingement.  I did place a steroid injection in both shoulder subacromial outlets which she tolerated well.  I would like to send her upstairs for physical therapy on both of her shoulders with any modalities per the therapist discretion to help improve her shoulder function and decrease her shoulder pain.  I will send in some methocarbamol to try and we will see her back in about 6 weeks to see how she is doing overall.    Procedure Note  Patient: [name]             Date of Birth: 10/26/1955           MRN: [id number]             Visit Date: 08/02/2022  Procedures: Visit Diagnoses:  1. Chronic pain of both shoulders     Large Joint Inj: bilateral subacromial bursa on 08/02/2022 1:43 PM Indications: pain and diagnostic  evaluation Details: 22 G 1.5 in needle  Arthrogram: No  Medications (Right): 5 mL lidocaine 1 %; 40 mg methylPREDNISolone acetate 40 MG/ML Medications (Left): 5 mL lidocaine 1 %; 40 mg methylPREDNISolone acetate 40 MG/ML Outcome: tolerated well, no immediate complications Procedure, treatment alternatives, risks and benefits explained, specific risks discussed. Consent was given by the patient. Immediately prior to procedure a time out was called to verify the correct patient, procedure, equipment, support staff and site/side marked as required. Patient was prepped and draped in the usual sterile fashion.

## 2022-08-04 DIAGNOSIS — F410 Panic disorder [episodic paroxysmal anxiety] without agoraphobia: Secondary | ICD-10-CM | POA: Diagnosis not present

## 2022-08-04 DIAGNOSIS — F411 Generalized anxiety disorder: Secondary | ICD-10-CM | POA: Diagnosis not present

## 2022-08-04 DIAGNOSIS — F3132 Bipolar disorder, current episode depressed, moderate: Secondary | ICD-10-CM | POA: Diagnosis not present

## 2022-08-07 ENCOUNTER — Other Ambulatory Visit: Payer: Self-pay | Admitting: Allergy & Immunology

## 2022-08-07 ENCOUNTER — Other Ambulatory Visit: Payer: Self-pay | Admitting: Family

## 2022-08-07 ENCOUNTER — Other Ambulatory Visit: Payer: Self-pay | Admitting: Internal Medicine

## 2022-08-07 DIAGNOSIS — J0110 Acute frontal sinusitis, unspecified: Secondary | ICD-10-CM

## 2022-08-07 DIAGNOSIS — G43809 Other migraine, not intractable, without status migrainosus: Secondary | ICD-10-CM

## 2022-08-08 ENCOUNTER — Other Ambulatory Visit: Payer: Self-pay | Admitting: Orthopaedic Surgery

## 2022-08-08 ENCOUNTER — Other Ambulatory Visit: Payer: Self-pay | Admitting: Internal Medicine

## 2022-08-08 ENCOUNTER — Other Ambulatory Visit: Payer: Self-pay | Admitting: Allergy & Immunology

## 2022-08-08 DIAGNOSIS — I10 Essential (primary) hypertension: Secondary | ICD-10-CM

## 2022-08-08 DIAGNOSIS — I493 Ventricular premature depolarization: Secondary | ICD-10-CM

## 2022-08-08 DIAGNOSIS — G2111 Neuroleptic induced parkinsonism: Secondary | ICD-10-CM

## 2022-08-09 ENCOUNTER — Other Ambulatory Visit: Payer: Self-pay | Admitting: Allergy & Immunology

## 2022-08-11 ENCOUNTER — Other Ambulatory Visit: Payer: Self-pay | Admitting: Internal Medicine

## 2022-08-11 DIAGNOSIS — I10 Essential (primary) hypertension: Secondary | ICD-10-CM

## 2022-08-11 DIAGNOSIS — G2111 Neuroleptic induced parkinsonism: Secondary | ICD-10-CM

## 2022-08-11 DIAGNOSIS — I493 Ventricular premature depolarization: Secondary | ICD-10-CM

## 2022-08-14 ENCOUNTER — Other Ambulatory Visit: Payer: Self-pay | Admitting: Internal Medicine

## 2022-08-14 DIAGNOSIS — E785 Hyperlipidemia, unspecified: Secondary | ICD-10-CM

## 2022-08-16 ENCOUNTER — Encounter: Payer: Self-pay | Admitting: Physical Therapy

## 2022-08-16 ENCOUNTER — Ambulatory Visit: Payer: Medicare HMO | Admitting: Physical Therapy

## 2022-08-16 ENCOUNTER — Other Ambulatory Visit: Payer: Self-pay

## 2022-08-16 DIAGNOSIS — M25611 Stiffness of right shoulder, not elsewhere classified: Secondary | ICD-10-CM

## 2022-08-16 DIAGNOSIS — M6281 Muscle weakness (generalized): Secondary | ICD-10-CM

## 2022-08-16 DIAGNOSIS — M25512 Pain in left shoulder: Secondary | ICD-10-CM

## 2022-08-16 DIAGNOSIS — R600 Localized edema: Secondary | ICD-10-CM | POA: Diagnosis not present

## 2022-08-16 DIAGNOSIS — M25511 Pain in right shoulder: Secondary | ICD-10-CM

## 2022-08-16 NOTE — Therapy (Signed)
OUTPATIENT PHYSICAL THERAPY SHOULDER EVALUATION   Patient Name: [name] MRN: [id number] DOB:September 30, 1955, 67 y.o., female Today's Date: 08/16/2022  END OF SESSION:  PT End of Session - 08/16/22 1256     Visit Number 1    Number of Visits 6    Date for PT Re-Evaluation 09/27/22    Authorization Type [employer]    Progress Note Due on Visit 10    PT Start Time 1300    PT Stop Time 1336    PT Time Calculation (min) 36 min    Activity Tolerance Patient tolerated treatment well    Behavior During Therapy WFL for tasks assessed/performed              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   Hypothyroidism    Intermittent palpitations    cardiology--  dr [place]   Interstitial cystitis    Limited jaw range of motion    s/p  bilateral TMJ surgery,  age 85s   Migraines    Moderate persistent asthma    pulmologist-- dr Craige Cotta   OA (osteoarthritis)    both knees   OSA on CPAP    per last sleep study 09/ 2017  mild OSA, AHI13/hr   PONV (postoperative nausea and vomiting)    PVC (premature ventricular contraction)    Rosacea    Sensation of pressure in bladder area    per pt intermittant   TMJ arthralgia    Urticaria    Wears glasses    White coat syndrome without hypertension    05-25-2017  per pt hx hypertension yrs ago, no issues after quitting stressful job   Past Surgical History:  Procedure Laterality Date   24 HOUR PH STUDY N/A 09/22/2015    Procedure: 24 HOUR PH STUDY;  Surgeon: Sherrilyn Rist, MD;  Location: [facility];  Service: Gastroenterology;  Laterality: N/A;   ADENOIDECTOMY     ANAL FISSURE REPAIR  age 37  (62)   BREAST EXCISIONAL BIOPSY Right 04-16-2003  dr p. young  [facility]   benign   BUNIONECTOMY Right early 2000s   CARPAL TUNNEL RELEASE Left age 20 (60)   CHOLECYSTECTOMY OPEN  age 78   CYSTO WITH HYDRODISTENSION N/A 06/02/2017   Procedure: CYSTOSCOPY/HYDRODISTENSION OF BLADDER;  Surgeon: Bjorn Pippin, MD;  Location: [facility];  Service: Urology;  Laterality: N/A;   CYSTO/ HYDRODISTENTION/  INSTILLATION THERAPY  1990s   DX LAPAROSCOPY/  DX HYSTEROSCOPY/  D & C  08-07-2007   dr dove  [facility]   ENDOMETRIAL ABLATION W/ NOVASURE  10-21-2008   dr Macon Large  [facility]   ESOPHAGEAL MANOMETRY N/A 09/22/2015   Procedure: ESOPHAGEAL MANOMETRY (EM);  Surgeon: Sherrilyn Rist, MD;  Location: [facility];  Service: Gastroenterology;  Laterality: N/A;  with impedence  FINGER ARTHROPLASTY Bilateral    left little finger 2016:  right middle finger 06/ 2018   KNEE ARTHROSCOPY Bilateral x3 left ;  x3  right-- last one age 26 (10)   NASAL SEPTUM SURGERY  age 66s   ROBOT ASSISTED REDUCTION PARAESOPHAGEAL HIATAL HERNIA/ TYPE 2 MEDIASTINAL DISSECTION/ PRIMARY HIATAL HERNIA REPAIR/ ANTERIOR & POSTERIOR GASTROPEXY/ NISSEN FUNDOPLICATION  03-04-2017   DR GROSS  [facility]   SINOSCOPY     TEMPOROMANDIBULAR JOINT SURGERY Bilateral x3  -- age 32s   per pt used graft   TONSILLECTOMY     TOTAL KNEE ARTHROPLASTY Left 12/26/2017   Procedure: LEFT TOTAL KNEE ARTHROPLASTY;  Surgeon: Ollen Gross, MD;  Location: [facility];  Service: Orthopedics;  Laterality: Left;    TRANSTHORACIC ECHOCARDIOGRAM  12-06-2017   dr [place]   ef 55-60%, grade 1 diastoilc dysfunction, trivial MR   TRIGGER FINGER RELEASE Bilateral last one 2017   several release's bilaterally   ULNAR NERVE TRANSPOSITION Bilateral age 67 (1980)   Patient Active Problem List    Diagnosis Date Noted   Type 2 diabetes mellitus with stage 3a chronic kidney disease, without long-term current use of insulin (HCC) 07/09/2022   Chronic hyperglycemia 03/02/2022   Hyperlipidemia LDL goal <100 08/24/2021   Encounter for general adult medical examination with abnormal findings 03/14/2021   Estrogen deficiency 03/09/2021   Cervical cancer screening 03/09/2021   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   Grade I diastolic dysfunction 03/12/2020   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   Osteoarthritis of glenohumeral joint 03/26/2019   Iron deficiency anemia 04/28/2018   OA (osteoarthritis) of knee 12/26/2017   PVC (premature ventricular contraction) 11/21/2017   Vitamin D deficiency, unspecified 10/13/2016   Primary osteoarthritis of left hand 06/30/2016   Osteoporosis without current pathological fracture 03/22/2016   Paraesophageal hiatal hernia s/p robotic repair & fundoplication 03/04/2016 03/04/2016   Bipolar 1 disorder, mixed, moderate (HCC) 01/29/2016   Class 1 obesity with body mass index (BMI) of 32.0 to 32.9 in adult 01/06/2016   Migraines 11/21/2014   OSA on CPAP 01/30/2007   GERD 11/02/2006   Hypothyroidism 10/07/2006   Bipolar affective (HCC) 10/07/2006   Essential hypertension 10/07/2006   RHINITIS, CHRONIC 10/07/2006   Seasonal and perennial allergic rhinitis 10/07/2006   Moderate persistent asthma, uncomplicated 10/07/2006   Irritable bowel syndrome 10/07/2006   INTERSTITIAL CYSTITIS 10/07/2006   ROSACEA 10/07/2006   Osteoarthritis of both knees 10/07/2006    PCP:  Etta Grandchild, MD   REFERRING PROVIDER: Kathryne Hitch, MD  REFERRING DIAG:M25.511,G89.29,M25.512 (ICD-10-CM) - Chronic pain of both shoulders  THERAPY DIAG:  Acute pain of right shoulder - Plan: PT plan of care cert/re-cert  Stiffness of right shoulder, not elsewhere classified - Plan: PT plan of care cert/re-cert  Localized edema - Plan: PT plan of  care cert/re-cert  Muscle weakness (generalized) - Plan: PT plan of care cert/re-cert  Acute pain of left shoulder - Plan: PT plan of care cert/re-cert  Rationale for Evaluation and Treatment: Rehabilitation  ONSET DATE: Feb/Mar 2024   SUBJECTIVE:  SUBJECTIVE STATEMENT: Pt reporting pain in bil shoulders and radiating into her elbows.  She has history or Rt shoulder scope in Nov 2023.  Recently had bil subacromial injections with a positive response, but pain not entirely resolved.   PERTINENT HISTORY: Rt shoulder arthroscopy 12/24/21, DM2, anxiety, bipolar, depression, eczema, h/o melanoma 2000's, OA, migraines, white coat symdrome without HTN, TKA-left 2019, knee arthroscopy bil 1999  PAIN:  NPRS scale: 3.5 currently; up to 10/10 Pain location: bil shoulder Pain description: achy Aggravating factors: reaching for items overhead or too low, getting dressed Relieving factors: injections  PRECAUTIONS: None  WEIGHT BEARING RESTRICTIONS: No  FALLS:  Has patient fallen in last 6 months? No   LIVING ENVIRONMENT: Lives with:  2 cats Lives in: Mobile home Stairs: Yes: External: 3 steps; can reach both Has following equipment at home: None  OCCUPATION: On disability, pt volunteers at church  PLOF: Independent  PATIENT GOALS: improve pain, function normally   OBJECTIVE:   DIAGNOSTIC FINDINGS:  None recently  PATIENT SURVEYS:  08/16/22: FOTO 51 (predicted 62)  COGNITION: Overall cognitive status: WFL     SENSATION: WFL  POSTURE: 08/16/22: rounded shoulders, forward head   UPPER EXTREMITY ROM: all measured  sitting  ROM Right 08/16/22 Left 08/16/22  Shoulder flexion 140 125  Shoulder extension    Shoulder abduction 132 135  Shoulder adduction    Shoulder internal rotation L2 L2  Shoulder external rotation 85 87  Elbow flexion    Elbow extension    (Blank rows = not tested)  UPPER EXTREMITY MMT:   MMT Right 08/16/22 Left 08/16/22  Shoulder flexion 4/5 4+/5  Shoulder extension    Shoulder abduction 4/5 4/5  Shoulder adduction    Shoulder internal rotation 5/5 5/5  Shoulder external rotation 4/5 4/5  (Blank rows = not tested)  SHOULDER SPECIAL TESTS: 08/16/22 Impingement tests: Hawkins/Kennedy impingement test: positive  bil Rotator cuff assessment: Empty can test: negative and Full can test: negative (pain, no weakness)  PALPATION:  08/16/22 TTP: with trigger points upper trap, infraspinatus   TODAY'S TREATMENT: DATE: 08/16/22:  Therex: See HEP - instruction/performance c cues for techniques, handout provided.  Trial set performed of each for comprehension and symptom assessment.  See below for exercise list   PATIENT EDUCATION: Education details: HEP, POC Person educated: Patient Education method: Explanation, Demonstration, Verbal cues, and Handouts Education comprehension: verbalized understanding, returned demonstration, and verbal cues required  HOME EXERCISE PROGRAM: Access Code: [email or web address] URL: [email or web address] Date: 08/16/2022 Prepared by: Moshe Cipro  Exercises - Standing Scapular Retraction  - 5-10 x daily - 7 x weekly - 1 sets - 5-10 reps - 5 sec hold - Shoulder External Rotation and Scapular Retraction with Resistance  - 2 x daily - 7 x weekly - 1 sets - 10 reps - 5 sec hold - Scapular Retraction with Resistance  - 2 x daily - 7 x weekly - 1 sets - 10 reps - 5 sec hold - Standing Scapular Retraction in Abduction  - 2 x daily - 7 x weekly - 1 sets - 10 reps - 5 sec hold - Standing Infraspinatus/Teres Minor Release with Ball at Wall  - 2 x  daily - 7 x weekly - 1 sets - 1 reps - 2-3 min hold   ASSESSMENT:  CLINICAL IMPRESSION: Patient is a 67 y.o. who comes to clinic with complaints of bil shoulder pain. She demonstrates decreased strength and mild ROM limitations, postural abnormalities and continued pain affecting functional mobility.  She will benefit from PT to address deficits listed.  .   OBJECTIVE IMPAIRMENTS: decreased ROM, decreased strength, hypomobility, increased fascial restrictions, increased muscle spasms, impaired UE functional use, postural dysfunction, and pain.   ACTIVITY LIMITATIONS: carrying, lifting, sleeping, bathing, toileting, dressing, reach over head, and hygiene/grooming  PARTICIPATION LIMITATIONS: meal prep, cleaning, laundry, driving, shopping, and community activity  PERSONAL FACTORS: 3+ comorbidities: Rt shoulder arthroscopy 12/24/21, DM2, anxiety, bipolar, depression, eczema, h/o melanoma 2000's, OA, migraines, white coat symdrome without HTN, TKA-left 2019, knee arthroscopy bil 1999  are also affecting patient's functional outcome.   REHAB POTENTIAL: Good  CLINICAL DECISION MAKING: Evolving/moderate complexity  EVALUATION COMPLEXITY: Moderate   GOALS: Goals reviewed with patient? Yes  SHORT TERM GOALS: Target date: 09/06/2022  Independent with initial HEP Goal status: INITIAL   LONG TERM GOALS: Target date: 09/27/2022  Independent with final HEP Goal status: INITIAL  2.  FOTO score improved to 62 Goal status: INITIAL  3.  Bil shoulder strength improved to 4+/5 for improved function Goal status: INIITAL  4.  Report pain < 3/10 with overhead activities for improved function Goal status: INITIAL   PLAN:  PT FREQUENCY: 1x/week  PT DURATION: 6 weeks  PLANNED INTERVENTIONS: Therapeutic exercises, Therapeutic activity, Neuro Muscular re-education, Balance training, Gait training, Patient/Family education, Joint mobilization, Stair training, DME instructions, Dry Needling,  Electrical stimulation, Traction, Cryotherapy, vasopneumatic device Moist heat, Taping, Ultrasound, Ionotophoresis 4mg /ml Dexamethasone, and Manual therapy.  All included unless contraindicated  PLAN FOR NEXT SESSION:  Review HEP, shoulder ROM, manual/modalities therapy as needed     Clarita Crane, PT, DPT 08/16/22 1:40 PM

## 2022-08-23 ENCOUNTER — Other Ambulatory Visit: Payer: Self-pay | Admitting: Orthopaedic Surgery

## 2022-08-23 ENCOUNTER — Ambulatory Visit: Payer: Medicare HMO | Admitting: Physical Therapy

## 2022-08-23 ENCOUNTER — Encounter: Payer: Self-pay | Admitting: Physical Therapy

## 2022-08-23 DIAGNOSIS — R600 Localized edema: Secondary | ICD-10-CM | POA: Diagnosis not present

## 2022-08-23 DIAGNOSIS — M6281 Muscle weakness (generalized): Secondary | ICD-10-CM

## 2022-08-23 DIAGNOSIS — M25512 Pain in left shoulder: Secondary | ICD-10-CM | POA: Diagnosis not present

## 2022-08-23 DIAGNOSIS — M25511 Pain in right shoulder: Secondary | ICD-10-CM

## 2022-08-23 DIAGNOSIS — M25611 Stiffness of right shoulder, not elsewhere classified: Secondary | ICD-10-CM | POA: Diagnosis not present

## 2022-08-23 NOTE — Therapy (Signed)
OUTPATIENT PHYSICAL THERAPY TREATMENT   Patient Name: [name] MRN: [id number] DOB:Mar 08, 1955, 67 y.o., female Today's Date: 08/23/2022  END OF SESSION:  PT End of Session - 08/23/22 1259     Visit Number 2    Number of Visits 6    Date for PT Re-Evaluation 09/27/22    Authorization Type [employer] Medicare    Progress Note Due on Visit 10    PT Start Time 1300    PT Stop Time 1340    PT Time Calculation (min) 40 min    Activity Tolerance Patient tolerated treatment well    Behavior During Therapy WFL for tasks assessed/performed               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   Hypothyroidism    Intermittent palpitations    cardiology--  dr [place]   Interstitial cystitis    Limited jaw range of motion    s/p  bilateral TMJ surgery,  age 43s   Migraines    Moderate persistent asthma    pulmologist-- dr Craige Cotta   OA (osteoarthritis)    both knees   OSA on CPAP    per last sleep study 09/ 2017  mild OSA, AHI13/hr   PONV (postoperative nausea and vomiting)    PVC (premature ventricular contraction)    Rosacea    Sensation of pressure in bladder area    per pt intermittant   TMJ arthralgia    Urticaria    Wears glasses    White coat syndrome without hypertension    05-25-2017  per pt hx hypertension yrs ago, no issues after quitting stressful job   Past Surgical History:  Procedure Laterality Date   24 HOUR PH STUDY N/A 09/22/2015    Procedure: 24 HOUR PH STUDY;  Surgeon: Sherrilyn Rist, MD;  Location: [facility];  Service: Gastroenterology;  Laterality: N/A;   ADENOIDECTOMY     ANAL FISSURE REPAIR  age 4  (84)   BREAST EXCISIONAL BIOPSY Right 04-16-2003  dr p. young  [facility]   benign   BUNIONECTOMY Right early 2000s   CARPAL TUNNEL RELEASE Left age 11 (29)   CHOLECYSTECTOMY OPEN  age 80   CYSTO WITH HYDRODISTENSION N/A 06/02/2017   Procedure: CYSTOSCOPY/HYDRODISTENSION OF BLADDER;  Surgeon: Bjorn Pippin, MD;  Location: [facility];  Service: Urology;  Laterality: N/A;   CYSTO/ HYDRODISTENTION/  INSTILLATION THERAPY  1990s   DX LAPAROSCOPY/  DX HYSTEROSCOPY/  D & C  08-07-2007   dr dove  [facility]   ENDOMETRIAL ABLATION W/ NOVASURE  10-21-2008   dr Macon Large  [facility]   ESOPHAGEAL MANOMETRY N/A 09/22/2015   Procedure: ESOPHAGEAL MANOMETRY (EM);  Surgeon: Sherrilyn Rist, MD;  Location: [facility];  Service: Gastroenterology;  Laterality: N/A;  with impedence  FINGER ARTHROPLASTY Bilateral    left little finger 2016:  right middle finger 06/ 2018   KNEE ARTHROSCOPY Bilateral x3 left ;  x3  right-- last one age 28 (69)   NASAL SEPTUM SURGERY  age 60s   ROBOT ASSISTED REDUCTION PARAESOPHAGEAL HIATAL HERNIA/ TYPE 2 MEDIASTINAL DISSECTION/ PRIMARY HIATAL HERNIA REPAIR/ ANTERIOR & POSTERIOR GASTROPEXY/ NISSEN FUNDOPLICATION  03-04-2017   DR GROSS  [facility]   SINOSCOPY     TEMPOROMANDIBULAR JOINT SURGERY Bilateral x3  -- age 32s   per pt used graft   TONSILLECTOMY     TOTAL KNEE ARTHROPLASTY Left 12/26/2017   Procedure: LEFT TOTAL KNEE ARTHROPLASTY;  Surgeon: Ollen Gross, MD;  Location: [facility];  Service: Orthopedics;  Laterality: Left;    TRANSTHORACIC ECHOCARDIOGRAM  12-06-2017   dr [place]   ef 55-60%, grade 1 diastoilc dysfunction, trivial MR   TRIGGER FINGER RELEASE Bilateral last one 2017   several release's bilaterally   ULNAR NERVE TRANSPOSITION Bilateral age 23 (1980)   Patient Active Problem List    Diagnosis Date Noted   Type 2 diabetes mellitus with stage 3a chronic kidney disease, without long-term current use of insulin (HCC) 07/09/2022   Chronic hyperglycemia 03/02/2022   Hyperlipidemia LDL goal <100 08/24/2021   Encounter for general adult medical examination with abnormal findings 03/14/2021   Estrogen deficiency 03/09/2021   Cervical cancer screening 03/09/2021   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   Grade I diastolic dysfunction 03/12/2020   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   Osteoarthritis of glenohumeral joint 03/26/2019   Iron deficiency anemia 04/28/2018   OA (osteoarthritis) of knee 12/26/2017   PVC (premature ventricular contraction) 11/21/2017   Vitamin D deficiency, unspecified 10/13/2016   Primary osteoarthritis of left hand 06/30/2016   Osteoporosis without current pathological fracture 03/22/2016   Paraesophageal hiatal hernia s/p robotic repair & fundoplication 03/04/2016 03/04/2016   Bipolar 1 disorder, mixed, moderate (HCC) 01/29/2016   Class 1 obesity with body mass index (BMI) of 32.0 to 32.9 in adult 01/06/2016   Migraines 11/21/2014   OSA on CPAP 01/30/2007   GERD 11/02/2006   Hypothyroidism 10/07/2006   Bipolar affective (HCC) 10/07/2006   Essential hypertension 10/07/2006   RHINITIS, CHRONIC 10/07/2006   Seasonal and perennial allergic rhinitis 10/07/2006   Moderate persistent asthma, uncomplicated 10/07/2006   Irritable bowel syndrome 10/07/2006   INTERSTITIAL CYSTITIS 10/07/2006   ROSACEA 10/07/2006   Osteoarthritis of both knees 10/07/2006    PCP:  Etta Grandchild, MD   REFERRING PROVIDER: Kathryne Hitch, MD  REFERRING DIAG:M25.511,G89.29,M25.512 (ICD-10-CM) - Chronic pain of both shoulders  THERAPY DIAG:  Acute pain of right shoulder  Stiffness of right shoulder, not elsewhere classified  Localized edema  Muscle weakness (generalized)  Acute pain of left shoulder  Rationale for Evaluation and  Treatment: Rehabilitation  ONSET DATE: Feb/Mar 2024   SUBJECTIVE:  SUBJECTIVE STATEMENT: Doing her HEP pretty consistently; not sure if it's helping.   PERTINENT HISTORY: Rt shoulder arthroscopy 12/24/21, DM2, anxiety, bipolar, depression, eczema, h/o melanoma 2000's, OA, migraines, white coat symdrome without HTN, TKA-left 2019, knee arthroscopy bil 1999  PAIN:  NPRS scale: 2 currently; up to 10/10 Pain location: bil shoulder Pain description: achy Aggravating factors: reaching for items overhead or too low, getting dressed Relieving factors: injections  PRECAUTIONS: None  WEIGHT BEARING RESTRICTIONS: No  FALLS:  Has patient fallen in last 6 months? No   LIVING ENVIRONMENT: Lives with:  2 cats Lives in: Mobile home Stairs: Yes: External: 3 steps; can reach both Has following equipment at home: None  OCCUPATION: On disability, pt volunteers at church  PLOF: Independent  PATIENT GOALS: improve pain, function normally   OBJECTIVE:   DIAGNOSTIC FINDINGS:  None recently  PATIENT SURVEYS:  08/16/22: FOTO 51 (predicted 62)  COGNITION: Overall cognitive status: WFL     SENSATION: WFL  POSTURE: 08/16/22: rounded shoulders, forward head   UPPER EXTREMITY ROM: all measured sitting  ROM Right 08/16/22 Left 08/16/22  Shoulder flexion 140 125  Shoulder extension    Shoulder abduction 132 135  Shoulder adduction    Shoulder internal rotation L2 L2  Shoulder external rotation 85 87  Elbow flexion    Elbow extension    (Blank rows = not tested)  UPPER EXTREMITY MMT:   MMT Right 08/16/22 Left 08/16/22  Shoulder flexion  4/5 4+/5  Shoulder extension    Shoulder abduction 4/5 4/5  Shoulder adduction    Shoulder internal rotation 5/5 5/5  Shoulder external rotation 4/5 4/5  (Blank rows = not tested)  SHOULDER SPECIAL TESTS: 08/16/22 Impingement tests: Hawkins/Kennedy impingement test: positive  bil Rotator cuff assessment: Empty can test: negative and Full can test: negative (pain, no weakness)  PALPATION:  08/16/22 TTP: with trigger points upper trap, infraspinatus   TODAY'S TREATMENT: DATE: 08/23/22 TherEx Scapular retraction 10 x 5 sec hold Seated bil ER with scapular retraction with L2 band 2x10 Seated W back 2x10; 5 sec hold Rows L2 band 2x10; 5 sec hold Extension L2 band 2x10 Bil shoulder flexion to 90 deg with 1# 2x10 Bil shoulder abduction to 90 deg with 1# 2x10 Bil overhead press 1# each 2x10 Supine AA flexion with 2# bar and 5-10 sec hold at end range 2x10 Supine protraction 2# 2x10 bil - max cues needed for technique   08/16/22:  Therex: See HEP - instruction/performance c cues for techniques, handout provided.  Trial set performed of each for comprehension and symptom assessment.  See below for exercise list   PATIENT EDUCATION: Education details: HEP, POC Person educated: Patient Education method: Explanation, Demonstration, Verbal cues, and Handouts Education comprehension: verbalized understanding, returned demonstration, and verbal cues required  HOME EXERCISE PROGRAM: Access Code: [email or web address] URL: [email or web address] Date: 08/16/2022 Prepared by: Moshe Cipro  Exercises - Standing Scapular Retraction  - 5-10 x daily - 7 x weekly - 1 sets - 5-10 reps - 5 sec hold - Shoulder External Rotation and Scapular Retraction with Resistance  - 2 x daily - 7 x weekly - 1 sets - 10 reps - 5 sec hold - Scapular Retraction with Resistance  - 2 x daily - 7 x weekly - 1 sets - 10 reps - 5 sec hold - Standing Scapular Retraction in Abduction  - 2 x daily - 7 x weekly - 1  sets - 10 reps - 5 sec hold - Standing Infraspinatus/Teres Minor Release  with Ball at [facility]  - 2 x daily - 7 x weekly - 1 sets - 1 reps - 2-3 min hold   ASSESSMENT:  CLINICAL IMPRESSION: Pt with good understanding of initial HEP which we reviewed today.  No goals met as only 2nd visit, and will continue to benefit from PT to maximize function.    OBJECTIVE IMPAIRMENTS: decreased ROM, decreased strength, hypomobility, increased fascial restrictions, increased muscle spasms, impaired UE functional use, postural dysfunction, and pain.   ACTIVITY LIMITATIONS: carrying, lifting, sleeping, bathing, toileting, dressing, reach over head, and hygiene/grooming  PARTICIPATION LIMITATIONS: meal prep, cleaning, laundry, driving, shopping, and community activity  PERSONAL FACTORS: 3+ comorbidities: Rt shoulder arthroscopy 12/24/21, DM2, anxiety, bipolar, depression, eczema, h/o melanoma 2000's, OA, migraines, white coat symdrome without HTN, TKA-left 2019, knee arthroscopy bil 1999  are also affecting patient's functional outcome.   REHAB POTENTIAL: Good  CLINICAL DECISION MAKING: Evolving/moderate complexity  EVALUATION COMPLEXITY: Moderate   GOALS: Goals reviewed with patient? Yes  SHORT TERM GOALS: Target date: 09/06/2022  Independent with initial HEP Goal status: INITIAL   LONG TERM GOALS: Target date: 09/27/2022  Independent with final HEP Goal status: INITIAL  2.  FOTO score improved to 62 Goal status: INITIAL  3.  Bil shoulder strength improved to 4+/5 for improved function Goal status: INIITAL  4.  Report pain < 3/10 with overhead activities for improved function Goal status: INITIAL   PLAN:  PT FREQUENCY: 1x/week  PT DURATION: 6 weeks  PLANNED INTERVENTIONS: Therapeutic exercises, Therapeutic activity, Neuro Muscular re-education, Balance training, Gait training, Patient/Family education, Joint mobilization, Stair training, DME instructions, Dry Needling, Electrical  stimulation, Traction, Cryotherapy, vasopneumatic device Moist heat, Taping, Ultrasound, Ionotophoresis 4mg /ml Dexamethasone, and Manual therapy.  All included unless contraindicated  PLAN FOR NEXT SESSION:  shoulder ROM and strengthening,  manual/modalities therapy as needed     Clarita Crane, PT, DPT 08/23/22 1:49 PM

## 2022-08-27 DIAGNOSIS — G4733 Obstructive sleep apnea (adult) (pediatric): Secondary | ICD-10-CM | POA: Diagnosis not present

## 2022-08-30 ENCOUNTER — Encounter: Payer: Self-pay | Admitting: Physical Therapy

## 2022-08-30 ENCOUNTER — Ambulatory Visit: Payer: Medicare HMO | Admitting: Physical Therapy

## 2022-08-30 DIAGNOSIS — R600 Localized edema: Secondary | ICD-10-CM | POA: Diagnosis not present

## 2022-08-30 DIAGNOSIS — M25611 Stiffness of right shoulder, not elsewhere classified: Secondary | ICD-10-CM | POA: Diagnosis not present

## 2022-08-30 DIAGNOSIS — M6281 Muscle weakness (generalized): Secondary | ICD-10-CM

## 2022-08-30 DIAGNOSIS — M25512 Pain in left shoulder: Secondary | ICD-10-CM | POA: Diagnosis not present

## 2022-08-30 DIAGNOSIS — M25511 Pain in right shoulder: Secondary | ICD-10-CM

## 2022-08-30 NOTE — Therapy (Signed)
OUTPATIENT PHYSICAL THERAPY TREATMENT   Patient Name: [name] MRN: [id number] DOB:[birth year], 67 y.o., female Today's Date: 08/30/2022  END OF SESSION:  PT End of Session - 08/30/22 1423     Visit Number 3    Number of Visits 6    Date for PT Re-Evaluation 09/27/22    Authorization Type [employer]    Progress Note Due on Visit 10    PT Start Time 1424    PT Stop Time 1505    PT Time Calculation (min) 41 min    Activity Tolerance Patient tolerated treatment well    Behavior During Therapy WFL for tasks assessed/performed                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   Hypothyroidism    Intermittent palpitations    cardiology--  dr [place]   Interstitial cystitis    Limited jaw range of motion    s/p  bilateral TMJ surgery,  age 73s   Migraines    Moderate persistent asthma    pulmologist-- dr Craige Cotta   OA (osteoarthritis)    both knees   OSA on CPAP    per last sleep study 09/ 2017  mild OSA, AHI13/hr   PONV (postoperative nausea and vomiting)    PVC (premature ventricular contraction)    Rosacea    Sensation of pressure in bladder area    per pt intermittant   TMJ arthralgia    Urticaria    Wears glasses    White coat syndrome without hypertension    05-25-2017  per pt hx hypertension yrs ago, no issues after quitting stressful job   Past Surgical History:  Procedure Laterality Date   24 HOUR PH STUDY N/A 09/22/2015    Procedure: 24 HOUR PH STUDY;  Surgeon: Sherrilyn Rist, MD;  Location: [facility];  Service: Gastroenterology;  Laterality: N/A;   ADENOIDECTOMY     ANAL FISSURE REPAIR  age 71  (71)   BREAST EXCISIONAL BIOPSY Right 04-16-2003  dr p. young  [facility]   benign   BUNIONECTOMY Right early 2000s   CARPAL TUNNEL RELEASE Left age 21 (62)   CHOLECYSTECTOMY OPEN  age 61   CYSTO WITH HYDRODISTENSION N/A 06/02/2017   Procedure: CYSTOSCOPY/HYDRODISTENSION OF BLADDER;  Surgeon: Bjorn Pippin, MD;  Location: [facility];  Service: Urology;  Laterality: N/A;   CYSTO/ HYDRODISTENTION/  INSTILLATION THERAPY  1990s   DX LAPAROSCOPY/  DX HYSTEROSCOPY/  D & C  08-07-2007   dr dove  [facility]   ENDOMETRIAL ABLATION W/ NOVASURE  10-21-2008   dr Macon Large  [facility]   ESOPHAGEAL MANOMETRY N/A 09/22/2015   Procedure: ESOPHAGEAL MANOMETRY (EM);  Surgeon: Sherrilyn Rist, MD;  Location: [facility];  Service: Gastroenterology;  Laterality: N/A;  with impedence  FINGER ARTHROPLASTY Bilateral    left little finger 2016:  right middle finger 06/ 2018   KNEE ARTHROSCOPY Bilateral x3 left ;  x3  right-- last one age 36 (79)   NASAL SEPTUM SURGERY  age 71s   ROBOT ASSISTED REDUCTION PARAESOPHAGEAL HIATAL HERNIA/ TYPE 2 MEDIASTINAL DISSECTION/ PRIMARY HIATAL HERNIA REPAIR/ ANTERIOR & POSTERIOR GASTROPEXY/ NISSEN FUNDOPLICATION  03-04-2017   DR GROSS  [facility]   SINOSCOPY     TEMPOROMANDIBULAR JOINT SURGERY Bilateral x3  -- age 89s   per pt used graft   TONSILLECTOMY     TOTAL KNEE ARTHROPLASTY Left 12/26/2017   Procedure: LEFT TOTAL KNEE ARTHROPLASTY;  Surgeon: Ollen Gross, MD;  Location: [facility];  Service: Orthopedics;  Laterality: Left;    TRANSTHORACIC ECHOCARDIOGRAM  12-06-2017   dr [place]   ef 55-60%, grade 1 diastoilc dysfunction, trivial MR   TRIGGER FINGER RELEASE Bilateral last one 2017   several release's bilaterally   ULNAR NERVE TRANSPOSITION Bilateral age 46 (1980)   Patient Active Problem List    Diagnosis Date Noted   Type 2 diabetes mellitus with stage 3a chronic kidney disease, without long-term current use of insulin (HCC) 07/09/2022   Chronic hyperglycemia 03/02/2022   Hyperlipidemia LDL goal <100 08/24/2021   Encounter for general adult medical examination with abnormal findings 03/14/2021   Estrogen deficiency 03/09/2021   Cervical cancer screening 03/09/2021   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   Grade I diastolic dysfunction 03/12/2020   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   Osteoarthritis of glenohumeral joint 03/26/2019   Iron deficiency anemia 04/28/2018   OA (osteoarthritis) of knee 12/26/2017   PVC (premature ventricular contraction) 11/21/2017   Vitamin D deficiency, unspecified 10/13/2016   Primary osteoarthritis of left hand 06/30/2016   Osteoporosis without current pathological fracture 03/22/2016   Paraesophageal hiatal hernia s/p robotic repair & fundoplication 03/04/2016 03/04/2016   Bipolar 1 disorder, mixed, moderate (HCC) 01/29/2016   Class 1 obesity with body mass index (BMI) of 32.0 to 32.9 in adult 01/06/2016   Migraines 11/21/2014   OSA on CPAP 01/30/2007   GERD 11/02/2006   Hypothyroidism 10/07/2006   Bipolar affective (HCC) 10/07/2006   Essential hypertension 10/07/2006   RHINITIS, CHRONIC 10/07/2006   Seasonal and perennial allergic rhinitis 10/07/2006   Moderate persistent asthma, uncomplicated 10/07/2006   Irritable bowel syndrome 10/07/2006   INTERSTITIAL CYSTITIS 10/07/2006   ROSACEA 10/07/2006   Osteoarthritis of both knees 10/07/2006    PCP:  Etta Grandchild, MD   REFERRING PROVIDER: Kathryne Hitch, MD  REFERRING DIAG:M25.511,G89.29,M25.512 (ICD-10-CM) - Chronic pain of both shoulders  THERAPY DIAG:  Acute pain of right shoulder  Stiffness of right shoulder, not elsewhere classified  Localized edema  Muscle weakness (generalized)  Acute pain of left shoulder  Rationale for Evaluation and  Treatment: Rehabilitation  ONSET DATE: Feb/Mar 2024   SUBJECTIVE:  SUBJECTIVE STATEMENT: Doing okay; some days are better than others   PERTINENT HISTORY: Rt shoulder arthroscopy 12/24/21, DM2, anxiety, bipolar, depression, eczema, h/o melanoma 2000's, OA, migraines, white coat symdrome without HTN, TKA-left 2019, knee arthroscopy bil 1999  PAIN:  NPRS scale: 4 currently; up to 7/10 Pain location: bil shoulder Pain description: achy Aggravating factors: reaching for items overhead or too low, getting dressed Relieving factors: injections  PRECAUTIONS: None  WEIGHT BEARING RESTRICTIONS: No  FALLS:  Has patient fallen in last 6 months? No   LIVING ENVIRONMENT: Lives with:  2 cats Lives in: Mobile home Stairs: Yes: External: 3 steps; can reach both Has following equipment at home: None  OCCUPATION: On disability, pt volunteers at church  PLOF: Independent  PATIENT GOALS: improve pain, function normally   OBJECTIVE:   DIAGNOSTIC FINDINGS:  None recently  PATIENT SURVEYS:  08/16/22: FOTO 51 (predicted 62)  COGNITION: Overall cognitive status: WFL     SENSATION: WFL  POSTURE: 08/16/22: rounded shoulders, forward head   UPPER EXTREMITY ROM: all measured sitting  ROM Right 08/16/22 Left 08/16/22  Shoulder flexion 140 125  Shoulder extension    Shoulder abduction 132 135  Shoulder adduction    Shoulder internal rotation L2 L2  Shoulder external rotation 85 87  Elbow flexion    Elbow extension    (Blank rows = not tested)  UPPER EXTREMITY MMT:   MMT Right 08/16/22 Left 08/16/22  Shoulder flexion 4/5 4+/5   Shoulder extension    Shoulder abduction 4/5 4/5  Shoulder adduction    Shoulder internal rotation 5/5 5/5  Shoulder external rotation 4/5 4/5  (Blank rows = not tested)  SHOULDER SPECIAL TESTS: 08/16/22 Impingement tests: Hawkins/Kennedy impingement test: positive  bil Rotator cuff assessment: Empty can test: negative and Full can test: negative (pain, no weakness)  PALPATION:  08/16/22 TTP: with trigger points upper trap, infraspinatus   TODAY'S TREATMENT: DATE: 08/30/22 TherEx Rows L3 band 2x10; 5 sec hold Shoulder extension L3 band 2x10 ER L2 band 2x10 bil IR L2 band 2x10 bil Shoulder flexion to 90 deg 2x10 bil; 2# standing Shoulder abduction to 90 deg 2x10 bil; 2# standing Standing bicep curls 2x10; 2# bil Bil overhead press 2# each 2x10 Plank at counter with alternating shoulder taps 2x10 bil Ball on wall (2# medicine ball) circles x 10 each direction; bil   08/23/22 TherEx Scapular retraction 10 x 5 sec hold Seated bil ER with scapular retraction with L2 band 2x10 Seated W back 2x10; 5 sec hold Rows L2 band 2x10; 5 sec hold Extension L2 band 2x10 Bil shoulder flexion to 90 deg with 1# 2x10 Bil shoulder abduction to 90 deg with 1# 2x10 Bil overhead press 1# each 2x10 Supine AA flexion with 2# bar and 5-10 sec hold at end range 2x10 Supine protraction 2# 2x10 bil - max cues needed for technique   08/16/22:  Therex: See HEP - instruction/performance c cues for techniques, handout provided.  Trial set performed of each for comprehension and symptom assessment.  See below for exercise list   PATIENT EDUCATION: Education details: HEP, POC Person educated: Patient Education method: Explanation, Demonstration, Verbal cues, and Handouts Education comprehension: verbalized understanding, returned demonstration, and verbal cues required  HOME EXERCISE PROGRAM: Access Code: [email or web address] URL: [email or web address] Date: 08/16/2022 Prepared by: Moshe Cipro  Exercises - Standing Scapular Retraction  - 5-10 x daily - 7 x weekly - 1 sets - 5-10 reps - 5 sec hold - Shoulder External Rotation and Scapular Retraction  with Resistance  - 2 x daily - 7 x weekly - 1 sets - 10 reps - 5 sec hold - Scapular Retraction with Resistance  - 2 x daily - 7 x weekly - 1 sets - 10 reps - 5 sec hold - Standing Scapular Retraction in Abduction  - 2 x daily - 7 x weekly - 1 sets - 10 reps - 5 sec hold - Standing Infraspinatus/Teres Minor Release with Ball at Wall  - 2 x daily - 7 x weekly - 1 sets - 1 reps - 2-3 min hold   ASSESSMENT:  CLINICAL IMPRESSION: Pt with decreased pain overall reported.  Able to progress strengthening exercises today.  Will continue to benefit from PT to maximize function.    OBJECTIVE IMPAIRMENTS: decreased ROM, decreased strength, hypomobility, increased fascial restrictions, increased muscle spasms, impaired UE functional use, postural dysfunction, and pain.   ACTIVITY LIMITATIONS: carrying, lifting, sleeping, bathing, toileting, dressing, reach over head, and hygiene/grooming  PARTICIPATION LIMITATIONS: meal prep, cleaning, laundry, driving, shopping, and community activity  PERSONAL FACTORS: 3+ comorbidities: Rt shoulder arthroscopy 12/24/21, DM2, anxiety, bipolar, depression, eczema, h/o melanoma 2000's, OA, migraines, white coat symdrome without HTN, TKA-left 2019, knee arthroscopy bil 1999  are also affecting patient's functional outcome.   REHAB POTENTIAL: Good  CLINICAL DECISION MAKING: Evolving/moderate complexity  EVALUATION COMPLEXITY: Moderate   GOALS: Goals reviewed with patient? Yes  SHORT TERM GOALS: Target date: 09/06/2022  Independent with initial HEP Goal status: INITIAL   LONG TERM GOALS: Target date: 09/27/2022  Independent with final HEP Goal status: INITIAL  2.  FOTO score improved to 62 Goal status: INITIAL  3.  Bil shoulder strength improved to 4+/5 for improved function Goal status:  INIITAL  4.  Report pain < 3/10 with overhead activities for improved function Goal status: INITIAL   PLAN:  PT FREQUENCY: 1x/week  PT DURATION: 6 weeks  PLANNED INTERVENTIONS: Therapeutic exercises, Therapeutic activity, Neuro Muscular re-education, Balance training, Gait training, Patient/Family education, Joint mobilization, Stair training, DME instructions, Dry Needling, Electrical stimulation, Traction, Cryotherapy, vasopneumatic device Moist heat, Taping, Ultrasound, Ionotophoresis 4mg /ml Dexamethasone, and Manual therapy.  All included unless contraindicated  PLAN FOR NEXT SESSION:  continue shoulder ROM and strengthening,  manual/modalities therapy as needed     Clarita Crane, PT, DPT 08/30/22 3:05 PM

## 2022-09-01 ENCOUNTER — Telehealth: Payer: Self-pay | Admitting: Internal Medicine

## 2022-09-01 NOTE — Telephone Encounter (Signed)
Patient lost 2 of her 3 jardiance prescriptions.  She would like to know if you have any samples.  Please call patient and let her know.  [phone]

## 2022-09-01 NOTE — Telephone Encounter (Signed)
Patient has picked up medication 

## 2022-09-01 NOTE — Telephone Encounter (Signed)
Pt informed samples ready for pick up at the front desk

## 2022-09-05 ENCOUNTER — Other Ambulatory Visit: Payer: Self-pay | Admitting: Internal Medicine

## 2022-09-05 DIAGNOSIS — E039 Hypothyroidism, unspecified: Secondary | ICD-10-CM

## 2022-09-06 ENCOUNTER — Encounter: Payer: Self-pay | Admitting: Physical Therapy

## 2022-09-06 ENCOUNTER — Ambulatory Visit: Payer: Medicare HMO | Admitting: Physical Therapy

## 2022-09-06 DIAGNOSIS — R600 Localized edema: Secondary | ICD-10-CM | POA: Diagnosis not present

## 2022-09-06 DIAGNOSIS — M25511 Pain in right shoulder: Secondary | ICD-10-CM | POA: Diagnosis not present

## 2022-09-06 DIAGNOSIS — M25611 Stiffness of right shoulder, not elsewhere classified: Secondary | ICD-10-CM

## 2022-09-06 DIAGNOSIS — M6281 Muscle weakness (generalized): Secondary | ICD-10-CM

## 2022-09-06 NOTE — Therapy (Signed)
OUTPATIENT PHYSICAL THERAPY TREATMENT   Patient Name: [name] MRN: [id number] DOB:1955-06-21, 67 y.o., female Today's Date: 09/06/2022  END OF SESSION:  PT End of Session - 09/06/22 1428     Visit Number 4    Number of Visits 6    Date for PT Re-Evaluation 09/27/22    Authorization Type [employer]    Progress Note Due on Visit 10    PT Start Time 1430    PT Stop Time 1508    PT Time Calculation (min) 38 min    Activity Tolerance Patient tolerated treatment well    Behavior During Therapy WFL for tasks assessed/performed                 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   Hypothyroidism    Intermittent palpitations    cardiology--  dr [place]   Interstitial cystitis    Limited jaw range of motion    s/p  bilateral TMJ surgery,  age 43s   Migraines    Moderate persistent asthma    pulmologist-- dr Craige Cotta   OA (osteoarthritis)    both knees   OSA on CPAP    per last sleep study 09/ 2017  mild OSA, AHI13/hr   PONV (postoperative nausea and vomiting)    PVC (premature ventricular contraction)    Rosacea    Sensation of pressure in bladder area    per pt intermittant   TMJ arthralgia    Urticaria    Wears glasses    White coat syndrome without hypertension    05-25-2017  per pt hx hypertension yrs ago, no issues after quitting stressful job   Past Surgical History:  Procedure Laterality Date   24 HOUR PH STUDY N/A 09/22/2015    Procedure: 24 HOUR PH STUDY;  Surgeon: Sherrilyn Rist, MD;  Location: [facility];  Service: Gastroenterology;  Laterality: N/A;   ADENOIDECTOMY     ANAL FISSURE REPAIR  age 14  (46)   BREAST EXCISIONAL BIOPSY Right 04-16-2003  dr p. young  [facility]   benign   BUNIONECTOMY Right early 2000s   CARPAL TUNNEL RELEASE Left age 56 (74)   CHOLECYSTECTOMY OPEN  age 85   CYSTO WITH HYDRODISTENSION N/A 06/02/2017   Procedure: CYSTOSCOPY/HYDRODISTENSION OF BLADDER;  Surgeon: Bjorn Pippin, MD;  Location: [facility];  Service: Urology;  Laterality: N/A;   CYSTO/ HYDRODISTENTION/  INSTILLATION THERAPY  1990s   DX LAPAROSCOPY/  DX HYSTEROSCOPY/  D & C  08-07-2007   dr dove  [facility]   ENDOMETRIAL ABLATION W/ NOVASURE  10-21-2008   dr Macon Large  [facility]   ESOPHAGEAL MANOMETRY N/A 09/22/2015   Procedure: ESOPHAGEAL MANOMETRY (EM);  Surgeon: Sherrilyn Rist, MD;  Location: [facility];  Service: Gastroenterology;  Laterality: N/A;  with impedence  FINGER ARTHROPLASTY Bilateral    left little finger 2016:  right middle finger 06/ 2018   KNEE ARTHROSCOPY Bilateral x3 left ;  x3  right-- last one age 23 (92)   NASAL SEPTUM SURGERY  age 68s   ROBOT ASSISTED REDUCTION PARAESOPHAGEAL HIATAL HERNIA/ TYPE 2 MEDIASTINAL DISSECTION/ PRIMARY HIATAL HERNIA REPAIR/ ANTERIOR & POSTERIOR GASTROPEXY/ NISSEN FUNDOPLICATION  03-04-2017   DR GROSS  [facility]   SINOSCOPY     TEMPOROMANDIBULAR JOINT SURGERY Bilateral x3  -- age 60s   per pt used graft   TONSILLECTOMY     TOTAL KNEE ARTHROPLASTY Left 12/26/2017   Procedure: LEFT TOTAL KNEE ARTHROPLASTY;  Surgeon: Ollen Gross, MD;  Location: [facility];  Service: Orthopedics;  Laterality: Left;    TRANSTHORACIC ECHOCARDIOGRAM  12-06-2017   dr [place]   ef 55-60%, grade 1 diastoilc dysfunction, trivial MR   TRIGGER FINGER RELEASE Bilateral last one 2017   several release's bilaterally   ULNAR NERVE TRANSPOSITION Bilateral age 42 (1980)   Patient Active Problem List    Diagnosis Date Noted   Type 2 diabetes mellitus with stage 3a chronic kidney disease, without long-term current use of insulin (HCC) 07/09/2022   Chronic hyperglycemia 03/02/2022   Hyperlipidemia LDL goal <100 08/24/2021   Encounter for general adult medical examination with abnormal findings 03/14/2021   Estrogen deficiency 03/09/2021   Cervical cancer screening 03/09/2021   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   Grade I diastolic dysfunction 03/12/2020   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   Osteoarthritis of glenohumeral joint 03/26/2019   Iron deficiency anemia 04/28/2018   OA (osteoarthritis) of knee 12/26/2017   PVC (premature ventricular contraction) 11/21/2017   Vitamin D deficiency, unspecified 10/13/2016   Primary osteoarthritis of left hand 06/30/2016   Osteoporosis without current pathological fracture 03/22/2016   Paraesophageal hiatal hernia s/p robotic repair & fundoplication 03/04/2016 03/04/2016   Bipolar 1 disorder, mixed, moderate (HCC) 01/29/2016   Class 1 obesity with body mass index (BMI) of 32.0 to 32.9 in adult 01/06/2016   Migraines 11/21/2014   OSA on CPAP 01/30/2007   GERD 11/02/2006   Hypothyroidism 10/07/2006   Bipolar affective (HCC) 10/07/2006   Essential hypertension 10/07/2006   RHINITIS, CHRONIC 10/07/2006   Seasonal and perennial allergic rhinitis 10/07/2006   Moderate persistent asthma, uncomplicated 10/07/2006   Irritable bowel syndrome 10/07/2006   INTERSTITIAL CYSTITIS 10/07/2006   ROSACEA 10/07/2006   Osteoarthritis of both knees 10/07/2006    PCP:  Etta Grandchild, MD   REFERRING PROVIDER: Kathryne Hitch, MD  REFERRING DIAG:M25.511,G89.29,M25.512 (ICD-10-CM) - Chronic pain of both shoulders  THERAPY DIAG:  Acute pain of right shoulder  Stiffness of right shoulder, not elsewhere classified  Localized edema  Muscle weakness (generalized)  Rationale for Evaluation and Treatment:  Rehabilitation  ONSET DATE: Feb/Mar 2024   SUBJECTIVE:  SUBJECTIVE STATEMENT: Band slipped out of hand last night; and hit her in the face; didn't fall.  Had trouble sleeping last night; but better today   PERTINENT HISTORY: Rt shoulder arthroscopy 12/24/21, DM2, anxiety, bipolar, depression, eczema, h/o melanoma 2000's, OA, migraines, white coat symdrome without HTN, TKA-left 2019, knee arthroscopy bil 1999  PAIN:  NPRS scale: 3 currently; up to 7/10 Pain location: bil shoulder Pain description: achy Aggravating factors: reaching for items overhead or too low, getting dressed Relieving factors: injections  PRECAUTIONS: None  WEIGHT BEARING RESTRICTIONS: No  FALLS:  Has patient fallen in last 6 months? No   LIVING ENVIRONMENT: Lives with:  2 cats Lives in: Mobile home Stairs: Yes: External: 3 steps; can reach both Has following equipment at home: None  OCCUPATION: On disability, pt volunteers at church  PLOF: Independent  PATIENT GOALS: improve pain, function normally   OBJECTIVE:   DIAGNOSTIC FINDINGS:  None recently  PATIENT SURVEYS:  08/16/22: FOTO 51 (predicted 62)  COGNITION: Overall cognitive status: WFL     SENSATION: WFL  POSTURE: 08/16/22: rounded shoulders, forward head   UPPER EXTREMITY ROM: all measured sitting  ROM Right 08/16/22 Left 08/16/22 Rt/Lt 09/06/22  Shoulder flexion 140 125 150/145  Shoulder extension     Shoulder abduction 132 135 142/143  Shoulder adduction     Shoulder internal rotation L2 L2 T10/T10  Shoulder external rotation 85 87   Elbow flexion     Elbow extension     (Blank  rows = not tested)  UPPER EXTREMITY MMT:   MMT Right 08/16/22 Left 08/16/22  Shoulder flexion 4/5 4+/5  Shoulder extension    Shoulder abduction 4/5 4/5  Shoulder adduction    Shoulder internal rotation 5/5 5/5  Shoulder external rotation 4/5 4/5  (Blank rows = not tested)  SHOULDER SPECIAL TESTS: 08/16/22 Impingement tests: Hawkins/Kennedy impingement test: positive  bil Rotator cuff assessment: Empty can test: negative and Full can test: negative (pain, no weakness)  PALPATION:  08/16/22 TTP: with trigger points upper trap, infraspinatus   TODAY'S TREATMENT: DATE: 09/06/22 TherEx Supine bil flexion 2# 2x10 Supine shoulder circles in 90 deg flex x 20 each CW/CCW bil; 2# Sidelying ER bil 2#; 2x10 Sidelying abduction 2# 2x10 bil AROM measurements - see above Bicep curls 2x10; 2# bil BATCA rows 10# 2x10 BATCA lat pull downs 15# 2x10  08/30/22 TherEx Rows L3 band 2x10; 5 sec hold Shoulder extension L3 band 2x10 ER L2 band 2x10 bil IR L2 band 2x10 bil Shoulder flexion to 90 deg 2x10 bil; 2# standing Shoulder abduction to 90 deg 2x10 bil; 2# standing Standing bicep curls 2x10; 2# bil Bil overhead press 2# each 2x10 Plank at counter with alternating shoulder taps 2x10 bil Ball on wall (2# medicine ball) circles x 10 each direction; bil   08/23/22 TherEx Scapular retraction 10 x 5 sec hold Seated bil ER with scapular retraction with L2 band 2x10 Seated W back 2x10; 5 sec hold Rows L2 band 2x10; 5 sec hold Extension L2 band 2x10 Bil shoulder flexion to 90 deg with 1# 2x10 Bil shoulder abduction to 90 deg with 1# 2x10 Bil overhead press 1# each 2x10 Supine AA flexion with 2# bar and 5-10 sec hold at end range 2x10 Supine protraction 2# 2x10 bil - max cues needed for technique   08/16/22:  Therex: See HEP - instruction/performance c cues for techniques, handout provided.  Trial set performed of each for comprehension and symptom assessment.  See below for exercise  list   PATIENT EDUCATION: Education details: HEP, POC Person educated: Patient Education method: Explanation, Demonstration, Verbal cues, and Handouts Education comprehension: verbalized understanding, returned demonstration, and verbal cues required  HOME EXERCISE PROGRAM: Access Code: [email or web address] URL: [email or web address] Date: 08/16/2022 Prepared by: Moshe Cipro  Exercises - Standing Scapular Retraction  - 5-10 x daily - 7 x weekly - 1 sets - 5-10 reps - 5 sec hold - Shoulder External Rotation and Scapular Retraction with Resistance  - 2 x daily - 7 x weekly - 1 sets - 10 reps - 5 sec hold - Scapular Retraction with Resistance  - 2 x daily - 7 x weekly - 1 sets - 10 reps - 5 sec hold - Standing Scapular Retraction in Abduction  - 2 x daily - 7 x weekly - 1 sets - 10 reps - 5 sec hold - Standing Infraspinatus/Teres Minor Release with Ball at Wall  - 2 x daily - 7 x weekly - 1 sets - 1 reps - 2-3 min hold   ASSESSMENT:  CLINICAL IMPRESSION: Pt has met her STG at this time; and AROM steadily improving.  Still having some episodes of pain, but feel this is improving overall as well.  Will continue to benefit from PT to maximize function.    OBJECTIVE IMPAIRMENTS: decreased ROM, decreased strength, hypomobility, increased fascial restrictions, increased muscle spasms, impaired UE functional use, postural dysfunction, and pain.   ACTIVITY LIMITATIONS: carrying, lifting, sleeping, bathing, toileting, dressing, reach over head, and hygiene/grooming  PARTICIPATION LIMITATIONS: meal prep, cleaning, laundry, driving, shopping, and community activity  PERSONAL FACTORS: 3+ comorbidities: Rt shoulder arthroscopy 12/24/21, DM2, anxiety, bipolar, depression, eczema, h/o melanoma 2000's, OA, migraines, white coat symdrome without HTN, TKA-left 2019, knee arthroscopy bil 1999  are also affecting patient's functional outcome.   REHAB POTENTIAL: Good  CLINICAL DECISION  MAKING: Evolving/moderate complexity  EVALUATION COMPLEXITY: Moderate   GOALS: Goals reviewed with patient? Yes  SHORT TERM GOALS: Target date: 09/06/2022  Independent with initial HEP Goal status: MET 09/06/22   LONG TERM GOALS: Target date: 09/27/2022  Independent with final HEP Goal status: ONGOING 09/06/22  2.  FOTO score improved to 62 Goal status: ONGOING 09/06/22  3.  Bil shoulder strength improved to 4+/5 for improved function Goal status: ONGOING 09/06/22  4.  Report pain < 3/10 with overhead activities for improved function Goal status: ONGOING 09/06/22   PLAN:  PT FREQUENCY: 1x/week  PT DURATION: 6 weeks  PLANNED INTERVENTIONS: Therapeutic exercises, Therapeutic activity, Neuro Muscular re-education, Balance training, Gait training, Patient/Family education, Joint mobilization, Stair training, DME instructions, Dry Needling, Electrical stimulation, Traction, Cryotherapy, vasopneumatic device Moist heat, Taping, Ultrasound, Ionotophoresis 4mg /ml Dexamethasone, and Manual therapy.  All included unless contraindicated  PLAN FOR NEXT SESSION:  scapular stabilization,  continue shoulder ROM and strengthening,  manual/modalities therapy as needed     Clarita Crane, PT, DPT 09/06/22 3:07 PM

## 2022-09-11 ENCOUNTER — Other Ambulatory Visit: Payer: Self-pay | Admitting: Internal Medicine

## 2022-09-11 DIAGNOSIS — E785 Hyperlipidemia, unspecified: Secondary | ICD-10-CM

## 2022-09-20 ENCOUNTER — Encounter: Payer: Self-pay | Admitting: Orthopaedic Surgery

## 2022-09-20 ENCOUNTER — Ambulatory Visit (INDEPENDENT_AMBULATORY_CARE_PROVIDER_SITE_OTHER): Payer: Medicare HMO | Admitting: Orthopaedic Surgery

## 2022-09-20 DIAGNOSIS — M25512 Pain in left shoulder: Secondary | ICD-10-CM

## 2022-09-20 DIAGNOSIS — M25511 Pain in right shoulder: Secondary | ICD-10-CM | POA: Diagnosis not present

## 2022-09-20 DIAGNOSIS — G8929 Other chronic pain: Secondary | ICD-10-CM | POA: Diagnosis not present

## 2022-09-20 NOTE — Progress Notes (Signed)
The patient is seen in follow-up as it relates to chronic bilateral shoulder pain.  She is 67 years old.  We did put her through physical therapy for both shoulders and she said that is really help with her range of motion improving.  However she still reports significant pain in both shoulders.  We did place a steroid injection in both shoulders at her last visit and these took a while to kick in and only lasted maybe a week or 2.  On exam her range of motion is improving with her right and left shoulder.  She is reaching above and behind her better.  However she is having significant pain in her shoulder still.  This point we need a MRI of both shoulders to assess the rotator cuff and the cartilage given the failure of conservative treatment including rest, anti-inflammatories, steroid injections and outpatient physical therapy.  Will see her back once we have MRI of both shoulders.  She agrees with this treatment plan.

## 2022-09-21 ENCOUNTER — Ambulatory Visit: Payer: Medicare HMO | Admitting: Physical Therapy

## 2022-09-21 ENCOUNTER — Other Ambulatory Visit: Payer: Self-pay

## 2022-09-21 ENCOUNTER — Encounter: Payer: Self-pay | Admitting: Physical Therapy

## 2022-09-21 DIAGNOSIS — M25511 Pain in right shoulder: Secondary | ICD-10-CM

## 2022-09-21 DIAGNOSIS — M6281 Muscle weakness (generalized): Secondary | ICD-10-CM | POA: Diagnosis not present

## 2022-09-21 DIAGNOSIS — R600 Localized edema: Secondary | ICD-10-CM

## 2022-09-21 DIAGNOSIS — M25512 Pain in left shoulder: Secondary | ICD-10-CM

## 2022-09-21 DIAGNOSIS — M25611 Stiffness of right shoulder, not elsewhere classified: Secondary | ICD-10-CM

## 2022-09-21 DIAGNOSIS — G8929 Other chronic pain: Secondary | ICD-10-CM

## 2022-09-21 NOTE — Therapy (Addendum)
OUTPATIENT PHYSICAL THERAPY TREATMENT RECERTIFICATION/PROGRESS NOTE  / DISCHARGE  Progress Note Reporting Period 08/16/22 to 09/21/22  See note below for Objective Data and Assessment of Progress/Goals.       Patient Name: [name] MRN: [id number] DOB:03-25-1955, 67 y.o., female Today's Date: 09/21/2022  END OF SESSION:  PT End of Session - 09/21/22 1106     Visit Number 5    Number of Visits 8    Date for PT Re-Evaluation 11/02/22    Authorization Type [employer]    Progress Note Due on Visit 10    PT Start Time 1103    PT Stop Time 1145    PT Time Calculation (min) 42 min    Activity Tolerance Patient tolerated treatment well    Behavior During Therapy WFL for tasks assessed/performed                  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   Hypothyroidism    Intermittent palpitations    cardiology--  dr [place]   Interstitial cystitis    Limited jaw range of motion    s/p  bilateral TMJ surgery,  age 20s   Migraines    Moderate persistent asthma    pulmologist-- dr Craige Cotta   OA (osteoarthritis)    both knees   OSA on CPAP    per last sleep study 09/ 2017  mild OSA, AHI13/hr   PONV (postoperative nausea and vomiting)    PVC (premature ventricular contraction)    Rosacea    Sensation of pressure in bladder area    per pt intermittant   TMJ arthralgia    Urticaria    Wears glasses    White coat syndrome without hypertension     05-25-2017  per pt hx hypertension yrs ago, no issues after quitting stressful job   Past Surgical History:  Procedure Laterality Date   24 HOUR PH STUDY N/A 09/22/2015   Procedure: 24 HOUR PH STUDY;  Surgeon: Sherrilyn Rist, MD;  Location: [facility];  Service: Gastroenterology;  Laterality: N/A;   ADENOIDECTOMY     ANAL FISSURE REPAIR  age 68  (57)   BREAST EXCISIONAL BIOPSY Right 04-16-2003  dr p. young  [facility]   benign   BUNIONECTOMY Right early 2000s   CARPAL TUNNEL RELEASE Left age 42 (1)   CHOLECYSTECTOMY OPEN  age 65   CYSTO WITH HYDRODISTENSION N/A 06/02/2017   Procedure: CYSTOSCOPY/HYDRODISTENSION OF BLADDER;  Surgeon: Bjorn Pippin, MD;  [facility];  Service: Urology;  Laterality: N/A;   CYSTO/ HYDRODISTENTION/  INSTILLATION THERAPY  1990s   DX LAPAROSCOPY/  DX HYSTEROSCOPY/  D & C  08-07-2007   dr dove  [facility]   ENDOMETRIAL ABLATION W/ NOVASURE  10-21-2008   dr Macon Large  [facility]   ESOPHAGEAL MANOMETRY  N/A 09/22/2015   Procedure: ESOPHAGEAL MANOMETRY (EM);  Surgeon: Sherrilyn Rist, MD;  Location: [facility];  Service: Gastroenterology;  Laterality: N/A;  with impedence    FINGER ARTHROPLASTY Bilateral    left little finger 2016:  right middle finger 06/ 2018   KNEE ARTHROSCOPY Bilateral x3 left ;  x3  right-- last one age 48 (40)   NASAL SEPTUM SURGERY  age 61s   ROBOT ASSISTED REDUCTION PARAESOPHAGEAL HIATAL HERNIA/ TYPE 2 MEDIASTINAL DISSECTION/ PRIMARY HIATAL HERNIA REPAIR/ ANTERIOR & POSTERIOR GASTROPEXY/ NISSEN FUNDOPLICATION  03-04-2017   DR GROSS  [facility]   SINOSCOPY     TEMPOROMANDIBULAR JOINT SURGERY Bilateral x3  -- age 6s   per pt used graft   TONSILLECTOMY     TOTAL KNEE ARTHROPLASTY Left 12/26/2017   Procedure: LEFT TOTAL KNEE ARTHROPLASTY;  Surgeon: Ollen Gross, MD;  Location: [facility];  Service: Orthopedics;  Laterality: Left;    TRANSTHORACIC ECHOCARDIOGRAM  12-06-2017   dr [place]   ef 55-60%, grade 1 diastoilc dysfunction,  trivial MR   TRIGGER FINGER RELEASE Bilateral last one 2017   several release's bilaterally   ULNAR NERVE TRANSPOSITION Bilateral age 89 (1980)   Patient Active Problem List   Diagnosis Date Noted   Type 2 diabetes mellitus with stage 3a chronic kidney disease, without long-term current use of insulin (HCC) 07/09/2022   Chronic hyperglycemia 03/02/2022   Hyperlipidemia LDL goal <100 08/24/2021   Encounter for general adult medical examination with abnormal findings 03/14/2021   Estrogen deficiency 03/09/2021   Cervical cancer screening 03/09/2021   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   Grade I diastolic dysfunction 03/12/2020   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   Osteoarthritis of glenohumeral joint 03/26/2019   Iron deficiency anemia 04/28/2018   OA (osteoarthritis) of knee 12/26/2017   PVC (premature ventricular contraction) 11/21/2017   Vitamin D deficiency, unspecified 10/13/2016   Primary osteoarthritis of left hand 06/30/2016   Osteoporosis without current pathological fracture 03/22/2016   Paraesophageal hiatal hernia s/p robotic repair & fundoplication 03/04/2016 03/04/2016   Bipolar 1 disorder, mixed, moderate (HCC) 01/29/2016   Class 1 obesity with body mass index (BMI) of 32.0 to 32.9 in adult 01/06/2016   Migraines 11/21/2014   OSA on CPAP 01/30/2007   GERD 11/02/2006   Hypothyroidism 10/07/2006   Bipolar affective (HCC) 10/07/2006   Essential hypertension 10/07/2006   RHINITIS, CHRONIC 10/07/2006   Seasonal and perennial allergic rhinitis 10/07/2006   Moderate persistent asthma, uncomplicated 10/07/2006   Irritable bowel syndrome 10/07/2006   INTERSTITIAL CYSTITIS 10/07/2006   ROSACEA 10/07/2006   Osteoarthritis of both knees 10/07/2006    PCP:  Etta Grandchild, MD   REFERRING PROVIDER: Kathryne Hitch, MD  REFERRING DIAG:M25.511,G89.29,M25.512 (ICD-10-CM) - Chronic pain of both shoulders  THERAPY DIAG:  Acute pain of right  shoulder - Plan: PT plan of care cert/re-cert  Stiffness of right shoulder, not elsewhere classified - Plan: PT plan of care cert/re-cert  Localized edema - Plan: PT plan of care cert/re-cert  Muscle weakness (generalized) - Plan: PT plan of care cert/re-cert  Acute pain of left shoulder - Plan: PT plan of care cert/re-cert  Rationale for Evaluation and Treatment: Rehabilitation  ONSET DATE: Feb/Mar 2024   SUBJECTIVE:  SUBJECTIVE STATEMENT: Feels her ROM is improved; pain is higher today due to the rainy weather   PERTINENT HISTORY: Rt shoulder arthroscopy 12/24/21, DM2, anxiety, bipolar, depression, eczema, h/o melanoma 2000's, OA, migraines, white coat symdrome without HTN, TKA-left 2019, knee arthroscopy bil 1999  PAIN:  NPRS scale: Rt: 2-7; Lt 2-9/10 Pain location: bil shoulder Pain description: achy Aggravating factors: reaching for items overhead or too low, getting dressed Relieving factors: injections  PRECAUTIONS: None  WEIGHT BEARING RESTRICTIONS: No  FALLS:  Has patient fallen in last 6 months? No   LIVING ENVIRONMENT: Lives with:  2 cats Lives in: Mobile home Stairs: Yes: External: 3 steps; can reach both Has following equipment at home: None  OCCUPATION: On disability, pt volunteers at church  PLOF: Independent  PATIENT GOALS: improve pain, function normally   OBJECTIVE:   DIAGNOSTIC FINDINGS:  None recently  PATIENT SURVEYS:  08/16/22: FOTO 51 (predicted 62) 09/21/22: FOTO 53  COGNITION: Overall cognitive status: WFL     SENSATION: WFL  POSTURE: 08/16/22: rounded shoulders, forward head    UPPER EXTREMITY ROM: all measured sitting  ROM Right 08/16/22 Left 08/16/22 Rt/Lt 09/06/22 Rt/Lt 09/21/22  Shoulder flexion 140 125 150/145 140/144  Shoulder extension      Shoulder abduction 132 135 142/143 154/165  Shoulder adduction      Shoulder internal rotation L2 L2 T10/T10 T10/T8  Shoulder external rotation 85 87    Elbow flexion      Elbow extension      (Blank rows = not tested)  UPPER EXTREMITY MMT:   MMT Right 08/16/22 Left 08/16/22 Rt/Lt 09/21/22  Shoulder flexion 4/5 4+/5 4/4+  Shoulder extension     Shoulder abduction 4/5 4/5 4+/5  Shoulder adduction     Shoulder internal rotation 5/5 5/5   Shoulder external rotation 4/5 4/5 5/5  (Blank rows = not tested)  SHOULDER SPECIAL TESTS: 08/16/22 Impingement tests: Hawkins/Kennedy impingement test: positive  bil Rotator cuff assessment: Empty can test: negative and Full can test: negative (pain, no weakness)  PALPATION:  08/16/22 TTP: with trigger points upper trap, infraspinatus   TODAY'S TREATMENT: DATE: 09/21/22 TherEx Rows L4 3x10 Shoulder extension L4 3x10 BATCA rows 10# 2x10 BATCA lat pull downs 15# 2x10 ROM and MMT - see above for details Standing bil shoulder flexion 2# 2x10 Standing bil shoulder abduction 2# 2x10  09/06/22 TherEx Supine bil flexion 2# 2x10 Supine shoulder circles in 90 deg flex x 20 each CW/CCW bil; 2# Sidelying ER bil 2#; 2x10 Sidelying abduction 2# 2x10 bil AROM measurements - see above Bicep curls 2x10; 2# bil BATCA rows 10# 2x10 BATCA lat pull downs 15# 2x10  08/30/22 TherEx Rows L3 band 2x10; 5 sec hold Shoulder extension L3 band 2x10 ER L2 band 2x10 bil IR L2 band 2x10 bil Shoulder flexion to 90 deg 2x10 bil; 2# standing Shoulder abduction to 90 deg 2x10 bil; 2# standing Standing bicep curls 2x10; 2# bil Bil overhead press 2# each 2x10 Plank at counter with alternating shoulder taps 2x10 bil Ball on wall (2# medicine ball) circles x 10 each direction;  bil   08/23/22 TherEx Scapular retraction 10 x 5 sec hold Seated bil ER with scapular retraction with L2 band 2x10 Seated W back 2x10; 5 sec hold Rows L2 band 2x10; 5 sec hold Extension L2 band 2x10 Bil shoulder flexion to 90 deg with 1# 2x10 Bil shoulder abduction to 90 deg with 1# 2x10 Bil overhead press 1# each 2x10 Supine AA flexion with 2# bar  and 5-10 sec hold at end range 2x10 Supine protraction 2# 2x10 bil - max cues needed for technique   08/16/22:  Therex: See HEP - instruction/performance c cues for techniques, handout provided.  Trial set performed of each for comprehension and symptom assessment.  See below for exercise list   PATIENT EDUCATION: Education details: HEP, POC Person educated: Patient Education method: Explanation, Demonstration, Verbal cues, and Handouts Education comprehension: verbalized understanding, returned demonstration, and verbal cues required  HOME EXERCISE PROGRAM: Access Code: [email or web address] URL: [email or web address] Date: 08/16/2022 Prepared by: Moshe Cipro  Exercises - Standing Scapular Retraction  - 5-10 x daily - 7 x weekly - 1 sets - 5-10 reps - 5 sec hold - Shoulder External Rotation and Scapular Retraction with Resistance  - 2 x daily - 7 x weekly - 1 sets - 10 reps - 5 sec hold - Scapular Retraction with Resistance  - 2 x daily - 7 x weekly - 1 sets - 10 reps - 5 sec hold - Standing Scapular Retraction in Abduction  - 2 x daily - 7 x weekly - 1 sets - 10 reps - 5 sec hold - Standing Infraspinatus/Teres Minor Release with Ball at Wall  - 2 x daily - 7 x weekly - 1 sets - 1 reps - 2-3 min hold   ASSESSMENT:  CLINICAL IMPRESSION: Pt met her strength goal today.  She still has increased pain and MD has ordered an MRI.  Agree to hold PT until after MRI and pt will return if indicated.      OBJECTIVE IMPAIRMENTS: decreased ROM, decreased strength, hypomobility, increased fascial restrictions, increased muscle  spasms, impaired UE functional use, postural dysfunction, and pain.   ACTIVITY LIMITATIONS: carrying, lifting, sleeping, bathing, toileting, dressing, reach over head, and hygiene/grooming  PARTICIPATION LIMITATIONS: meal prep, cleaning, laundry, driving, shopping, and community activity  PERSONAL FACTORS: 3+ comorbidities: Rt shoulder arthroscopy 12/24/21, DM2, anxiety, bipolar, depression, eczema, h/o melanoma 2000's, OA, migraines, white coat symdrome without HTN, TKA-left 2019, knee arthroscopy bil 1999  are also affecting patient's functional outcome.   REHAB POTENTIAL: Good  CLINICAL DECISION MAKING: Evolving/moderate complexity  EVALUATION COMPLEXITY: Moderate   GOALS: Goals reviewed with patient? Yes  SHORT TERM GOALS: Target date: 09/06/2022  Independent with initial HEP Goal status: MET 09/06/22   LONG TERM GOALS: Target date: 11/02/2022    Independent with final HEP Goal status: ONGOING 09/21/22  2.  FOTO score improved to 62 Goal status: ONGOING 09/21/22  3.  Bil shoulder strength improved to 4+/5 for improved function Goal status: MET 09/21/22  4.  Report pain < 3/10 with overhead activities for improved function Goal status: ONGOING 09/21/22   PLAN:  PT FREQUENCY: Every other week  PT DURATION: 6 weeks  PLANNED INTERVENTIONS: Therapeutic exercises, Therapeutic activity, Neuro Muscular re-education, Balance training, Gait training, Patient/Family education, Joint mobilization, Stair training, DME instructions, Dry Needling, Electrical stimulation, Traction, Cryotherapy, vasopneumatic device Moist heat, Taping, Ultrasound, Ionotophoresis 4mg /ml Dexamethasone, and Manual therapy.  All included unless contraindicated  PLAN FOR NEXT SESSION:  hold PT until after MRI, see what MD says,  scapular stabilization,  continue shoulder ROM and strengthening,  manual/modalities therapy as needed     Clarita Crane, PT, DPT 09/21/22 11:55 AM    PHYSICAL  THERAPY DISCHARGE SUMMARY  Visits from Start of Care: 5  Current functional level related to goals / functional outcomes: See note   Remaining deficits: See note   Education / Equipment: HEP  Patient goals were  partially met. Patient is being discharged due to not returning since the last visit.  Chyrel Masson, PT, DPT, OCS, ATC 11/11/22  10:41 AM

## 2022-09-24 ENCOUNTER — Other Ambulatory Visit: Payer: Self-pay | Admitting: Allergy & Immunology

## 2022-09-27 DIAGNOSIS — G4733 Obstructive sleep apnea (adult) (pediatric): Secondary | ICD-10-CM | POA: Diagnosis not present

## 2022-09-28 DIAGNOSIS — Z6833 Body mass index (BMI) 33.0-33.9, adult: Secondary | ICD-10-CM | POA: Diagnosis not present

## 2022-09-28 DIAGNOSIS — G4733 Obstructive sleep apnea (adult) (pediatric): Secondary | ICD-10-CM | POA: Diagnosis not present

## 2022-09-29 DIAGNOSIS — F3132 Bipolar disorder, current episode depressed, moderate: Secondary | ICD-10-CM | POA: Diagnosis not present

## 2022-10-01 ENCOUNTER — Other Ambulatory Visit: Payer: Self-pay | Admitting: Allergy & Immunology

## 2022-10-08 ENCOUNTER — Ambulatory Visit (INDEPENDENT_AMBULATORY_CARE_PROVIDER_SITE_OTHER): Payer: Medicare HMO | Admitting: Family Medicine

## 2022-10-08 ENCOUNTER — Encounter: Payer: Self-pay | Admitting: Family Medicine

## 2022-10-08 VITALS — BP 114/74 | HR 81 | Temp 97.1°F | Ht 58.66 in | Wt 162.0 lb

## 2022-10-08 DIAGNOSIS — I10 Essential (primary) hypertension: Secondary | ICD-10-CM | POA: Diagnosis not present

## 2022-10-08 DIAGNOSIS — K582 Mixed irritable bowel syndrome: Secondary | ICD-10-CM

## 2022-10-08 DIAGNOSIS — R609 Edema, unspecified: Secondary | ICD-10-CM

## 2022-10-08 DIAGNOSIS — Z23 Encounter for immunization: Secondary | ICD-10-CM | POA: Diagnosis not present

## 2022-10-08 DIAGNOSIS — R197 Diarrhea, unspecified: Secondary | ICD-10-CM | POA: Diagnosis not present

## 2022-10-08 MED ORDER — DICYCLOMINE HCL 10 MG PO CAPS: 10.0000 mg | ORAL_CAPSULE | Freq: Three times a day (TID) | ORAL | 0 refills | Status: DC

## 2022-10-08 NOTE — Progress Notes (Signed)
Subjective:     Patient ID: [name], female    DOB: [birth year], 67 y.o.   MRN: [id number]  Chief Complaint  Patient presents with   Irritable Bowel Syndrome    Patient states that she has already been diagnosed and just needs some treatment    Joint Swelling    Couple of weeks. Patient states that this used to happen a lot, it quit then it started back up again.     HPI  Discussed the use of AI scribe software for clinical note transcription with the patient, who gave verbal consent to proceed.  History of Present Illness         C/o a 3 wk hx of abdominal cramping and diarrhea. States she had this before. States she has IBS.  She is out of Bentyl   No new medications. No changes in diet.   Nausea. No vomiting.   Ankle and foot swelling for years. Not as bad today.    Health Maintenance Due  Topic Date Due   FOOT EXAM  03/09/2022   Diabetic kidney evaluation - Urine ACR  08/25/2022   HEMOGLOBIN A1C  08/31/2022    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   Hypothyroidism    Intermittent palpitations    cardiology--  dr [place]   Interstitial cystitis    Limited jaw range of motion    s/p  bilateral TMJ surgery,  age 16s   Migraines    Moderate persistent asthma    pulmologist-- dr Craige Cotta   OA (osteoarthritis)    both knees   OSA on CPAP    per last sleep study 09/ 2017  mild OSA, AHI13/hr   PONV (postoperative nausea and vomiting)    PVC  (premature ventricular contraction)    Rosacea    Sensation of pressure in bladder area    per pt intermittant   TMJ arthralgia    Urticaria    Wears glasses    White coat syndrome without hypertension    05-25-2017  per pt hx hypertension yrs ago, no issues after quitting stressful job    Past Surgical History:  Procedure Laterality Date   24 HOUR PH STUDY N/A 09/22/2015   Procedure: 24 HOUR PH STUDY;  Surgeon: Sherrilyn Rist, MD;  Location: [facility];  Service: Gastroenterology;  Laterality: N/A;   ADENOIDECTOMY     ANAL FISSURE REPAIR  age 53  (61)   BREAST EXCISIONAL BIOPSY Right 04-16-2003  dr p. young  [facility]   benign   BUNIONECTOMY Right early 2000s   CARPAL TUNNEL RELEASE Left age 13 (7)   CHOLECYSTECTOMY OPEN  age 64   CYSTO WITH HYDRODISTENSION N/A 06/02/2017   Procedure: CYSTOSCOPY/HYDRODISTENSION OF BLADDER;  Surgeon: Bjorn Pippin, MD;  Location: [facility];  Service: Urology;  Laterality: N/A;   CYSTO/ HYDRODISTENTION/  INSTILLATION THERAPY  1990s   DX LAPAROSCOPY/  DX HYSTEROSCOPY/  D & C  08-07-2007   dr dove  [facility]   ENDOMETRIAL ABLATION W/ NOVASURE  10-21-2008   dr Macon Large  [facility]   ESOPHAGEAL MANOMETRY N/A 09/22/2015   Procedure: ESOPHAGEAL MANOMETRY (EM);  Surgeon: Sherrilyn Rist, MD;  Location: [facility];  Service: Gastroenterology;  Laterality: N/A;  with impedence    FINGER ARTHROPLASTY Bilateral    left little finger 2016:  right middle finger 06/ 2018   KNEE ARTHROSCOPY Bilateral x3 left ;  x3  right-- last one age 66 (79)   NASAL SEPTUM SURGERY  age 53s   ROBOT ASSISTED REDUCTION PARAESOPHAGEAL HIATAL HERNIA/ TYPE 2 MEDIASTINAL DISSECTION/ PRIMARY HIATAL HERNIA REPAIR/ ANTERIOR & POSTERIOR GASTROPEXY/ NISSEN FUNDOPLICATION  03-04-2017   DR GROSS  [facility]   SINOSCOPY     TEMPOROMANDIBULAR JOINT SURGERY Bilateral x3  -- age 57s   per pt used graft   TONSILLECTOMY     TOTAL KNEE ARTHROPLASTY Left 12/26/2017   Procedure: LEFT TOTAL KNEE  ARTHROPLASTY;  Surgeon: Ollen Gross, MD;  Location: [facility];  Service: Orthopedics;  Laterality: Left;    TRANSTHORACIC ECHOCARDIOGRAM  12-06-2017   dr [place]   ef 55-60%, grade 1 diastoilc dysfunction, trivial MR   TRIGGER FINGER RELEASE Bilateral last one 2017   several release's bilaterally   ULNAR NERVE TRANSPOSITION Bilateral age 23 (15)    Family History  Problem Relation Age of Onset   Hypertension Mother        deceased from MVA complications   Thyroid disease Mother    Allergic rhinitis Mother    Testicular cancer Father    Allergic rhinitis Father    Colon cancer Sister    Colon polyps Sister    Healthy Sister    Healthy Brother    Coronary artery disease Other    Stomach cancer Neg Hx     Social History   Socioeconomic History   Marital status: Divorced    Spouse name: Not on file   Number of children: 0   Years of education: Not on file   Highest education level: Bachelor's degree (e.g., BA, AB, BS)  Occupational History   Occupation: disability  Tobacco Use   Smoking status: Never   Smokeless tobacco: Never  Vaping Use   Vaping status: Never Used  Substance and Sexual Activity   Alcohol use: Yes    Alcohol/week: 0.0 standard drinks of alcohol    Comment: once every 3-4 months   Drug use: No   Sexual activity: Not Currently    Partners: Male    Birth control/protection: Post-menopausal  Other Topics Concern   Not on file  Social History Narrative   Not on file   Social Determinants of Health   Financial Resource Strain: Low Risk  (07/01/2022)   Overall Financial Resource Strain (CARDIA)    Difficulty of Paying Living Expenses: Not very hard  Food Insecurity: No Food Insecurity (07/01/2022)   Hunger Vital Sign    Worried About Running Out of Food in the Last Year: Never true    Ran Out of Food in the Last Year: Never true  Transportation Needs: No Transportation Needs (07/01/2022)   PRAPARE - Administrator, Civil Service  (Medical): No    Lack of Transportation (Non-Medical): No  Physical Activity: Insufficiently Active (07/01/2022)   Exercise Vital Sign    Days of Exercise  per Week: 2 days    Minutes of Exercise per Session: 30 min  Stress: No Stress Concern Present (07/01/2022)   [employer] of Occupational Health - Occupational Stress Questionnaire    Feeling of Stress : Not at all  Social Connections: Moderately Integrated (07/01/2022)   Social Connection and Isolation Panel [NHANES]    Frequency of Communication with Friends and Family: Twice a week    Frequency of Social Gatherings with Friends and Family: Once a week    Attends Religious Services: More than 4 times per year    Active Member of [employer] or Organizations: Yes    Attends Banker Meetings: More than 4 times per year    Marital Status: Divorced  Intimate Partner Violence: Not At Risk (03/25/2022)   Humiliation, Afraid, Rape, and Kick questionnaire    Fear of Current or Ex-Partner: No    Emotionally Abused: No    Physically Abused: No    Sexually Abused: No    Outpatient Medications Prior to Visit  Medication Sig Dispense Refill   albuterol (VENTOLIN HFA) 108 (90 Base) MCG/ACT inhaler INHALE 1-2 PUFFS INTO THE LUNGS DAILY AS NEEDED FOR WHEEZING OR SHORTNESS OF BREATH. 18 each 1   Azelastine HCl 0.15 % SOLN Place 1-2 sprays into both nostrils 2 (two) times daily. 30 mL 5   budesonide-formoterol (SYMBICORT) 160-4.5 MCG/ACT inhaler Inhale 2 puffs into the lungs in the morning and at bedtime. 3 each 5   Carbinoxamine Maleate 4 MG TABS TAKE 1 TABLET BY MOUTH EVERY 6 HOURS. 120 tablet 5   cariprazine (VRAYLAR) 3 MG capsule      Daridorexant HCl 50 MG TABS Take 1 tablet by mouth at bedtime.     DULoxetine (CYMBALTA) 60 MG capsule Take 60 mg by mouth 2 (two) times daily at 10 AM and 5 PM.     empagliflozin (JARDIANCE) 10 MG TABS tablet Take 1 tablet (10 mg total) by mouth daily before breakfast. 90 tablet 1   fluticasone  (FLONASE) 50 MCG/ACT nasal spray Place 1 spray into both nostrils 2 (two) times daily as needed for allergies or rhinitis. 48 mL 1   hydrocortisone 2.5 % ointment Apply topically 2 (two) times daily.     levothyroxine (SYNTHROID) 25 MCG tablet TAKE 1 TABLET BY MOUTH EVERY DAY 90 tablet 0   metoprolol tartrate (LOPRESSOR) 25 MG tablet TAKE 1 TABLET BY MOUTH TWICE A DAY 180 tablet 0   montelukast (SINGULAIR) 10 MG tablet Take 1 tablet (10 mg total) by mouth at bedtime. 90 tablet 1   MYRBETRIQ 50 MG TB24 tablet Take 50 mg by mouth daily.     NURTEC 75 MG TBDP TAKE 1 TABLET BY MOUTH EVERY OTHER DAY 40 tablet 2   rosuvastatin (CRESTOR) 20 MG tablet TAKE 1 TABLET BY MOUTH EVERY DAY 90 tablet 0   spironolactone (ALDACTONE) 25 MG tablet Take 1 tablet (25 mg total) by mouth daily. 90 tablet 0   tiZANidine (ZANAFLEX) 2 MG tablet TAKE 1 TABLET (2MG  TOTAL) BY MOUTH TWICE A DAY AS NEEDED FOR MUSCLE SPASM 180 tablet 1   trihexyphenidyl (ARTANE) 2 MG tablet TAKE 1 TABLET BY MOUTH EVERY DAY 90 tablet 0   zinc gluconate 50 MG tablet Take 50 mg by mouth daily.     dicyclomine (BENTYL) 10 MG capsule Take 1 capsule (10 mg total) by mouth 3 (three) times daily before meals. 90 capsule 2   No facility-administered medications prior to visit.    Allergies  Allergen Reactions   Emetrol Itching   Indomethacin Other (See Comments)    Muscle spasms Causes muscle spasms in neck   Iodinated Contrast Media Itching    Flushed and Fever, itch all over   Amlodipine Swelling    Peripheral edema   Carbamazepine Other (See Comments)    Easy and unusual bleeding    Pseudoephedrine Other (See Comments)    I fly off the walls  CANNOT SLEEP   Risperidone And Related Other (See Comments)    Stomach upset, insomnia, drooling, tremors, "jerks," sensitivity to touch.    Bupivacaine Hives   Pentazocine Other (See Comments)    Unknown reaction MADE ME "LACTATE"   Propoxyphene Itching and Nausea And Vomiting    darvocet    Sulfa Antibiotics Other (See Comments)    Unknown reaction   Tramadol Other (See Comments)    Patient can not remember   Aspirin Other (See Comments)    upset stomach, ringing in the ears   Codeine Itching and Other (See Comments)    Anything related to codeine   Etodolac Rash and Other (See Comments)   Propranolol Nausea Only and Other (See Comments)    Review of Systems  Constitutional:  Negative for chills and fever.  Respiratory:  Negative for shortness of breath.   Cardiovascular:  Positive for leg swelling. Negative for chest pain, palpitations and claudication.  Gastrointestinal:  Positive for abdominal pain, diarrhea and nausea. Negative for vomiting.  Genitourinary:  Negative for dysuria, frequency and urgency.  Neurological:  Negative for dizziness and focal weakness.       Objective:    Physical Exam Constitutional:      General: She is not in acute distress.    Appearance: She is not ill-appearing.  HENT:     Mouth/Throat:     Mouth: Mucous membranes are moist.  Eyes:     Extraocular Movements: Extraocular movements intact.     Conjunctiva/sclera: Conjunctivae normal.  Cardiovascular:     Rate and Rhythm: Normal rate and regular rhythm.  Pulmonary:     Effort: Pulmonary effort is normal.     Breath sounds: Normal breath sounds.  Abdominal:     General: There is no distension.     Palpations: Abdomen is soft.     Tenderness: There is generalized abdominal tenderness. There is no guarding or rebound.  Musculoskeletal:     Cervical back: Normal range of motion and neck supple.     Right lower leg: No edema.     Left lower leg: No edema.  Skin:    General: Skin is warm and dry.  Neurological:     General: No focal deficit present.     Mental Status: She is alert and oriented to person, place, and time.     Cranial Nerves: No cranial nerve deficit.     Motor: No weakness.     Coordination: Coordination normal.     Gait: Gait normal.  Psychiatric:         Mood and Affect: Mood normal.        Behavior: Behavior normal.        Thought Content: Thought content normal.      BP 114/74 (BP Location: Left Arm, Patient Position: Sitting, Cuff Size: Normal)   Pulse 81   Temp (!) 97.1 F (36.2 C) (Temporal)   Ht 4' 10.66" (1.49 m)   Wt 162 lb (73.5 kg)   LMP 03/05/2012 (Approximate)   SpO2 97%   BMI  33.10 kg/m  Wt Readings from Last 3 Encounters:  10/08/22 162 lb (73.5 kg)  07/29/22 160 lb (72.6 kg)  07/01/22 154 lb (69.9 kg)       Assessment & Plan:   Problem List Items Addressed This Visit       Cardiovascular and Mediastinum   Essential hypertension     Digestive   Irritable bowel syndrome   Relevant Medications   dicyclomine (BENTYL) 10 MG capsule   Other Visit Diagnoses     Diarrhea, unspecified type    -  Primary   Relevant Orders   CBC   Basic metabolic panel   Need for influenza vaccination       Relevant Orders   Flu Vaccine Trivalent High Dose (Fluad) (Completed)   Dependent edema          No acute distress. No acute abdomen. Hx of same.  Refilled Bentyl.  Check labs. No pedal edema today. Follow up with PCP   I am having Shamyla B. Myren maintain her zinc gluconate, hydrocortisone, Myrbetriq, cariprazine, DULoxetine, Nurtec, Daridorexant HCl, spironolactone, empagliflozin, Azelastine HCl, budesonide-formoterol, fluticasone, montelukast, metoprolol tartrate, trihexyphenidyl, tiZANidine, levothyroxine, rosuvastatin, albuterol, Carbinoxamine Maleate, and dicyclomine.  Meds ordered this encounter  Medications   dicyclomine (BENTYL) 10 MG capsule    Sig: Take 1 capsule (10 mg total) by mouth 3 (three) times daily before meals.    Dispense:  90 capsule    Refill:  0    Order Specific Question:   Supervising Provider    Answer:   Hillard Danker A [[id number]]

## 2022-10-09 LAB — CBC
HCT: 34.3 % — ABNORMAL LOW (ref 35.0–45.0)
Hemoglobin: 11.3 g/dL — ABNORMAL LOW (ref 11.7–15.5)
MCH: 28.3 pg (ref 27.0–33.0)
MCHC: 32.9 g/dL (ref 32.0–36.0)
MCV: 85.8 fL (ref 80.0–100.0)
MPV: 10.7 fL (ref 7.5–12.5)
Platelets: 302 10*3/uL (ref 140–400)
RBC: 4 10*6/uL (ref 3.80–5.10)
RDW: 13.7 % (ref 11.0–15.0)
WBC: 9.8 10*3/uL (ref 3.8–10.8)

## 2022-10-09 LAB — BASIC METABOLIC PANEL
BUN/Creatinine Ratio: 14 (calc) (ref 6–22)
BUN: 16 mg/dL (ref 7–25)
CO2: 24 mmol/L (ref 20–32)
Calcium: 9.5 mg/dL (ref 8.6–10.4)
Chloride: 105 mmol/L (ref 98–110)
Creat: 1.14 mg/dL — ABNORMAL HIGH (ref 0.50–1.05)
Glucose, Bld: 92 mg/dL (ref 65–99)
Potassium: 4.1 mmol/L (ref 3.5–5.3)
Sodium: 141 mmol/L (ref 135–146)

## 2022-10-21 ENCOUNTER — Ambulatory Visit: Payer: Medicare HMO | Admitting: Allergy & Immunology

## 2022-10-22 ENCOUNTER — Other Ambulatory Visit: Payer: Medicare HMO

## 2022-10-23 ENCOUNTER — Other Ambulatory Visit: Payer: Self-pay | Admitting: Allergy & Immunology

## 2022-10-25 ENCOUNTER — Encounter: Payer: Self-pay | Admitting: Internal Medicine

## 2022-10-25 ENCOUNTER — Ambulatory Visit (INDEPENDENT_AMBULATORY_CARE_PROVIDER_SITE_OTHER): Payer: Medicare HMO | Admitting: Internal Medicine

## 2022-10-25 VITALS — BP 118/68 | HR 81 | Temp 97.8°F | Resp 16 | Ht 58.66 in | Wt 160.0 lb

## 2022-10-25 DIAGNOSIS — N1831 Chronic kidney disease, stage 3a: Secondary | ICD-10-CM

## 2022-10-25 DIAGNOSIS — D509 Iron deficiency anemia, unspecified: Secondary | ICD-10-CM | POA: Diagnosis not present

## 2022-10-25 DIAGNOSIS — N1832 Chronic kidney disease, stage 3b: Secondary | ICD-10-CM

## 2022-10-25 DIAGNOSIS — E1122 Type 2 diabetes mellitus with diabetic chronic kidney disease: Secondary | ICD-10-CM | POA: Diagnosis not present

## 2022-10-25 DIAGNOSIS — I10 Essential (primary) hypertension: Secondary | ICD-10-CM

## 2022-10-25 DIAGNOSIS — E039 Hypothyroidism, unspecified: Secondary | ICD-10-CM

## 2022-10-25 DIAGNOSIS — I950 Idiopathic hypotension: Secondary | ICD-10-CM

## 2022-10-25 LAB — IBC + FERRITIN
Ferritin: 13.5 ng/mL (ref 10.0–291.0)
Iron: 42 ug/dL (ref 42–145)
Saturation Ratios: 9.9 % — ABNORMAL LOW (ref 20.0–50.0)
TIBC: 425.6 ug/dL (ref 250.0–450.0)
Transferrin: 304 mg/dL (ref 212.0–360.0)

## 2022-10-25 LAB — URINALYSIS, ROUTINE W REFLEX MICROSCOPIC
Bilirubin Urine: NEGATIVE
Hgb urine dipstick: NEGATIVE
Ketones, ur: NEGATIVE
Leukocytes,Ua: NEGATIVE
Nitrite: NEGATIVE
Specific Gravity, Urine: <=1.005 — AB (ref 1.000–1.030)
Total Protein, Urine: NEGATIVE
Urine Glucose: >=1000 — AB
Urobilinogen, UA: 0.2 (ref 0.0–1.0)
pH: 6.5 (ref 5.0–8.0)

## 2022-10-25 LAB — MICROALBUMIN / CREATININE URINE RATIO
Creatinine,U: 46.3 mg/dL
Microalb Creat Ratio: 1.5 mg/g (ref 0.0–30.0)
Microalb, Ur: <0.7 mg/dL (ref 0.0–1.9)

## 2022-10-25 LAB — BASIC METABOLIC PANEL
BUN: 8 mg/dL (ref 6–23)
CO2: 29 meq/L (ref 19–32)
Calcium: 9 mg/dL (ref 8.4–10.5)
Chloride: 102 meq/L (ref 96–112)
Creatinine, Ser: 1.04 mg/dL (ref 0.40–1.20)
GFR: 56.23 mL/min — ABNORMAL LOW (ref >60.00)
Glucose, Bld: 94 mg/dL (ref 70–99)
Potassium: 4.1 meq/L (ref 3.5–5.1)
Sodium: 137 meq/L (ref 135–145)

## 2022-10-25 LAB — HEMOGLOBIN A1C: Hgb A1c MFr Bld: 6.8 % — ABNORMAL HIGH (ref 4.6–6.5)

## 2022-10-25 LAB — TSH: TSH: 0.33 u[IU]/mL — ABNORMAL LOW (ref 0.35–5.50)

## 2022-10-25 LAB — CORTISOL: Cortisol, Plasma: 6 ug/dL

## 2022-10-25 NOTE — Patient Instructions (Signed)

## 2022-10-25 NOTE — Progress Notes (Unsigned)
Subjective:  Patient ID: [name], female    DOB: [birth year]  Age: 67 y.o. MRN: [id number]  CC: Anemia, Hypertension, and Diabetes   HPI [name] presents for f/up ----  She is active and denies chest pain, shortness of breath, diaphoresis, edema, or fatigue.  Outpatient Medications Prior to Visit  Medication Sig Dispense Refill   azelastine (ASTELIN) 0.1 % nasal spray Place 2 sprays into both nostrils 2 (two) times daily. Use in each nostril as directed 30 mL 5   budesonide-formoterol (SYMBICORT) 160-4.5 MCG/ACT inhaler Inhale 2 puffs into the lungs in the morning and at bedtime. 3 each 5   Carbinoxamine Maleate 4 MG TABS TAKE 1 TABLET BY MOUTH EVERY 6 HOURS. 120 tablet 5   cariprazine (VRAYLAR) 3 MG capsule      Daridorexant HCl 50 MG TABS Take 1 tablet by mouth at bedtime.     dicyclomine (BENTYL) 10 MG capsule Take 1 capsule (10 mg total) by mouth 3 (three) times daily before meals. 90 capsule 0   DULoxetine (CYMBALTA) 60 MG capsule Take 60 mg by mouth 2 (two) times daily at 10 AM and 5 PM.     empagliflozin (JARDIANCE) 10 MG TABS tablet Take 1 tablet (10 mg total) by mouth daily before breakfast. 90 tablet 1   fluticasone (FLONASE) 50 MCG/ACT nasal spray Place 1 spray into both nostrils 2 (two) times daily as needed for allergies or rhinitis. 48 mL 1   hydrocortisone 2.5 % ointment Apply topically 2 (two) times daily.     metoprolol tartrate (LOPRESSOR) 25 MG tablet TAKE 1 TABLET BY MOUTH TWICE A DAY 180 tablet 0   montelukast (SINGULAIR) 10 MG tablet Take 1 tablet (10 mg total) by mouth at bedtime. 90 tablet 1   MYRBETRIQ 50 MG TB24 tablet Take 50 mg by mouth daily.     NURTEC 75 MG TBDP TAKE 1 TABLET BY MOUTH EVERY OTHER DAY 40 tablet 2   rosuvastatin (CRESTOR) 20 MG tablet TAKE 1 TABLET BY MOUTH EVERY DAY 90 tablet 0   spironolactone (ALDACTONE) 25 MG tablet Take 1 tablet (25 mg total) by mouth daily. 90 tablet 0   tiZANidine (ZANAFLEX) 2 MG tablet TAKE 1 TABLET  (2MG  TOTAL) BY MOUTH TWICE A DAY AS NEEDED FOR MUSCLE SPASM 180 tablet 1   trihexyphenidyl (ARTANE) 2 MG tablet TAKE 1 TABLET BY MOUTH EVERY DAY 90 tablet 0   zinc gluconate 50 MG tablet Take 50 mg by mouth daily.     albuterol (VENTOLIN HFA) 108 (90 Base) MCG/ACT inhaler INHALE 1-2 PUFFS INTO THE LUNGS DAILY AS NEEDED FOR WHEEZING OR SHORTNESS OF BREATH. 18 each 1   levothyroxine (SYNTHROID) 25 MCG tablet TAKE 1 TABLET BY MOUTH EVERY DAY 90 tablet 0   No facility-administered medications prior to visit.    ROS Review of Systems  Constitutional:  Negative for diaphoresis, fatigue and unexpected weight change.  HENT: Negative.    Eyes: Negative.   Respiratory:  Negative for cough, chest tightness, shortness of breath and wheezing.   Cardiovascular:  Negative for chest pain, palpitations and leg swelling.  Gastrointestinal:  Negative for abdominal pain, constipation, diarrhea, nausea and vomiting.  Endocrine: Negative.   Genitourinary: Negative.  Negative for difficulty urinating and dysuria.  Musculoskeletal: Negative.  Negative for arthralgias and myalgias.  Skin: Negative.   Neurological: Negative.  Negative for dizziness, weakness and light-headedness.  Hematological:  Negative for adenopathy. Does not bruise/bleed easily.  Psychiatric/Behavioral: Negative.  Objective:  BP 118/68 (BP Location: Left Arm, Patient Position: Sitting, Cuff Size: Large)   Pulse 81   Temp 97.8 F (36.6 C) (Oral)   Resp 16   Ht 4' 10.66" (1.49 m)   Wt 160 lb (72.6 kg)   LMP 03/05/2012 (Approximate)   SpO2 93%   BMI 32.69 kg/m   BP Readings from Last 3 Encounters:  10/25/22 118/68  10/08/22 114/74  07/29/22 130/78    Wt Readings from Last 3 Encounters:  10/25/22 160 lb (72.6 kg)  10/08/22 162 lb (73.5 kg)  07/29/22 160 lb (72.6 kg)    Physical Exam Vitals reviewed.  HENT:     Mouth/Throat:     Mouth: Mucous membranes are moist.  Eyes:     General: No scleral icterus.     Conjunctiva/sclera: Conjunctivae normal.  Cardiovascular:     Rate and Rhythm: Normal rate and regular rhythm.     Heart sounds: No murmur heard.    No friction rub. No gallop.     Comments: EKG-- NSR, 75 bpm No LVH, Q waves, or ST/T waves  Pulmonary:     Effort: Pulmonary effort is normal.     Breath sounds: No stridor. No wheezing, rhonchi or rales.  Abdominal:     General: Abdomen is flat.     Palpations: There is no mass.     Tenderness: There is no abdominal tenderness. There is no guarding.     Hernia: No hernia is present.  Musculoskeletal:     Cervical back: Neck supple.     Right lower leg: No edema.     Left lower leg: No edema.  Lymphadenopathy:     Cervical: No cervical adenopathy.  Skin:    General: Skin is warm and dry.  Neurological:     General: No focal deficit present.     Mental Status: She is alert. Mental status is at baseline.  Psychiatric:        Mood and Affect: Mood normal.        Behavior: Behavior normal.     Lab Results  Component Value Date   WBC 9.8 10/08/2022   HGB 11.3 (L) 10/08/2022   HCT 34.3 (L) 10/08/2022   PLT 302 10/08/2022   GLUCOSE 94 10/25/2022   CHOL 155 07/01/2022   TRIG 75.0 07/01/2022   HDL 68.40 07/01/2022   LDLDIRECT 143.0 09/14/2016   LDLCALC 72 07/01/2022   ALT 42 (H) 03/02/2022   AST 28 03/02/2022   NA 137 10/25/2022   K 4.1 10/25/2022   CL 102 10/25/2022   CREATININE 1.04 10/25/2022   BUN 8 10/25/2022   CO2 29 10/25/2022   TSH 0.33 (L) 10/25/2022   INR 0.94 12/21/2017   HGBA1C 6.8 (H) 10/25/2022   MICROALBUR <0.7 10/25/2022    MM 3D SCREENING MAMMOGRAM BILATERAL BREAST  Result Date: 07/26/2022 CLINICAL DATA:  Screening. EXAM: DIGITAL SCREENING BILATERAL MAMMOGRAM WITH TOMOSYNTHESIS AND CAD TECHNIQUE: Bilateral screening digital craniocaudal and mediolateral oblique mammograms were obtained. Bilateral screening digital breast tomosynthesis was performed. The images were evaluated with computer-aided  detection. COMPARISON:  Previous exam(s). ACR Breast Density Category c: The breasts are heterogeneously dense, which may obscure small masses. FINDINGS: There are no findings suspicious for malignancy. IMPRESSION: No mammographic evidence of malignancy. A result letter of this screening mammogram will be mailed directly to the patient. RECOMMENDATION: Screening mammogram in one year. (Code:SM-B-01Y) BI-RADS CATEGORY  1: Negative. Electronically Signed   By: Elberta Fortis M.D.  On: 07/26/2022 13:48    Assessment & Plan:   Acquired hypothyroidism- Her TSH is suppressed.  Will discontinue the T4. -     TSH; Future  Essential hypertension- Her blood pressure is well-controlled.  EKG is negative for LVH. -     Urinalysis, Routine w reflex microscopic; Future -     EKG 12-Lead -     Cortisol; Future -     TSH; Future -     Basic metabolic panel; Future  Type 2 diabetes mellitus with stage 3a chronic kidney disease, without long-term current use of insulin (HCC)- Her blood sugar is adequately well-controlled. -     Microalbumin / creatinine urine ratio; Future -     Urinalysis, Routine w reflex microscopic; Future -     Hemoglobin A1c; Future -     HM Diabetes Foot Exam -     Basic metabolic panel; Future  Iron deficiency anemia, unspecified iron deficiency anemia type -     IBC + Ferritin; Future -     Hemoccult Cards (X3 cards); Future  Idiopathic hypotension- Labs are negative for secondary causes. -     Cortisol; Future -     TSH; Future -     Basic metabolic panel; Future     Follow-up: Return in about 4 months (around 02/24/2023).  Sanda Linger, MD

## 2022-10-26 DIAGNOSIS — N1832 Chronic kidney disease, stage 3b: Secondary | ICD-10-CM | POA: Insufficient documentation

## 2022-10-27 ENCOUNTER — Other Ambulatory Visit: Payer: Self-pay | Admitting: Allergy & Immunology

## 2022-10-27 DIAGNOSIS — H43813 Vitreous degeneration, bilateral: Secondary | ICD-10-CM | POA: Diagnosis not present

## 2022-10-27 DIAGNOSIS — E119 Type 2 diabetes mellitus without complications: Secondary | ICD-10-CM | POA: Diagnosis not present

## 2022-10-27 DIAGNOSIS — H2513 Age-related nuclear cataract, bilateral: Secondary | ICD-10-CM | POA: Diagnosis not present

## 2022-10-27 DIAGNOSIS — H524 Presbyopia: Secondary | ICD-10-CM | POA: Diagnosis not present

## 2022-10-27 LAB — HM DIABETES EYE EXAM

## 2022-10-28 ENCOUNTER — Ambulatory Visit: Payer: Medicare HMO | Admitting: Orthopaedic Surgery

## 2022-10-28 ENCOUNTER — Encounter: Payer: Self-pay | Admitting: Internal Medicine

## 2022-10-28 DIAGNOSIS — G4733 Obstructive sleep apnea (adult) (pediatric): Secondary | ICD-10-CM | POA: Diagnosis not present

## 2022-10-30 ENCOUNTER — Other Ambulatory Visit: Payer: Self-pay | Admitting: Family Medicine

## 2022-10-30 DIAGNOSIS — K582 Mixed irritable bowel syndrome: Secondary | ICD-10-CM

## 2022-11-03 ENCOUNTER — Ambulatory Visit: Payer: Medicare HMO | Admitting: Pulmonary Disease

## 2022-11-10 ENCOUNTER — Telehealth: Payer: Self-pay | Admitting: Internal Medicine

## 2022-11-10 NOTE — Telephone Encounter (Signed)
Patient saw Dr. Yetta Barre on 10/25/2022. She said she was sent home with a kit and she misplaced it. Patient would like to know what Dr. Yetta Barre would like for her to do. She would like a call back at [phone].

## 2022-11-10 NOTE — Telephone Encounter (Signed)
Pt has been informed to come to the lab to pick up another one.

## 2022-11-11 ENCOUNTER — Other Ambulatory Visit: Payer: Self-pay | Admitting: Internal Medicine

## 2022-11-11 DIAGNOSIS — T43505A Adverse effect of unspecified antipsychotics and neuroleptics, initial encounter: Secondary | ICD-10-CM

## 2022-11-18 ENCOUNTER — Ambulatory Visit: Payer: Medicare HMO | Admitting: Allergy & Immunology

## 2022-11-18 ENCOUNTER — Other Ambulatory Visit: Payer: Self-pay

## 2022-11-18 ENCOUNTER — Encounter: Payer: Self-pay | Admitting: Allergy & Immunology

## 2022-11-18 VITALS — BP 132/80 | HR 107 | Temp 97.4°F | Resp 20 | Ht 59.0 in | Wt 163.6 lb

## 2022-11-18 DIAGNOSIS — J3089 Other allergic rhinitis: Secondary | ICD-10-CM

## 2022-11-18 DIAGNOSIS — J302 Other seasonal allergic rhinitis: Secondary | ICD-10-CM | POA: Diagnosis not present

## 2022-11-18 DIAGNOSIS — J4540 Moderate persistent asthma, uncomplicated: Secondary | ICD-10-CM

## 2022-11-18 DIAGNOSIS — J383 Other diseases of vocal cords: Secondary | ICD-10-CM | POA: Diagnosis not present

## 2022-11-18 NOTE — Patient Instructions (Addendum)
1. Moderate persistent asthma, uncomplicated - Spirometry looked very good today. - I think that the Markus Daft has been helpful for you. - Let us know if this is too expensive.  - Daily controller medication(s): Singulair 10mg  daily and Breztri 2 puffs twice daily with spacer - Prior to physical activity: albuterol 2 puffs 10-15 minutes before physical activity. - Rescue medications: albuterol 4 puffs every 4-6 hours as needed - Changes during respiratory infections or worsening symptoms: Add on Flovent to 4 puffs  2 times daily for TWO WEEKS. - Asthma control goals:  * Full participation in all desired activities (may need albuterol before activity) * Albuterol use two time or less a week on average (not counting use with activity) * Cough interfering with sleep two time or less a month * Oral steroids no more than once a year * No hospitalizations  2. Seasonal and perennial allergic rhinitis - Continue with Singulair 10mg  once daily. - Continue with carbinoxamine up to three times daily. - Continue with azelastine one spray per nostril up to TWICE as needed depending on your symptoms.  - Continue with Pataday eye drops twice daily as needed.  3. Return in about 6 months (around 05/19/2023). You can have the follow up appointment with Dr. Dellis Anes or a Nurse Practicioner (our Nurse Practitioners are excellent and always have Physician oversight!).    Please inform us of any Emergency Department visits, hospitalizations, or changes in symptoms. Call us before going to the ED for breathing or allergy symptoms since we might be able to fit you in for a sick visit. Feel free to contact us anytime with any questions, problems, or concerns.  It was a pleasure to see you again today!  Websites that have reliable patient information: 1. American Academy of Asthma, Allergy, and Immunology: www.aaaai.org 2. Food Allergy Research and Education (FARE): foodallergy.org 3. Mothers of  Asthmatics: http://www.asthmacommunitynetwork.org 4. American College of Allergy, Asthma, and Immunology: www.acaai.org   COVID-19 Vaccine Information can be found at: [email or web address].[email or web address] For questions related to vaccine distribution or appointments, please email [email or web address] or call [phone].     "Like" [place] on Facebook and Instagram for our latest updates!      A healthy democracy works best when [employer] participate! Make sure you are registered to vote! If you have moved or changed any of your contact information, you will need to get this updated before voting! Scan the QR codes below to learn more!

## 2022-11-18 NOTE — Progress Notes (Signed)
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 Moderate persistent asthma, uncomplicated - Spirometry looked very good today. - I think that the Markus Daft has been helpful for you. - Let us know if this is too expensive.  - Daily controller medication(s): Singulair 10mg  daily and Breztri 2 puffs twice daily with spacer - Prior to physical activity: albuterol 2 puffs 10-15 minutes before physical activity. - Rescue medications: albuterol 4 puffs every 4-6 hours as needed - Changes during respiratory infections or worsening symptoms: Add on Flovent to 4 puffs  2 times daily for TWO WEEKS. - Asthma control goals:  * Full participation in all desired activities (may need albuterol before activity) * Albuterol use two time or less a week on average (not counting use with activity) * Cough interfering with sleep two time or less a month * Oral steroids no more than once a year * No hospitalizations  2. Seasonal and perennial allergic rhinitis - Continue with Singulair 10mg  once daily. - Continue with carbinoxamine up to three times daily. - Continue with azelastine one spray per nostril up to TWICE as needed depending on your symptoms.  - Continue with Pataday eye drops twice daily as needed.  3. Return in about 6 months (around 05/19/2023). You can have the follow up appointment with Dr. Dellis Anes or a Nurse Practicioner (our Nurse Practitioners are excellent and always have Physician oversight!).   Subjective:   [name] is a 67 y.o. female presenting today for follow up of  Chief Complaint  Patient presents with   Asthma    3 mth f/u - Doing Good    [name] has a history of the following: Patient Active Problem List   Diagnosis Date Noted   Type 2 diabetes  mellitus with stage 3b chronic kidney disease, without long-term current use of insulin (HCC) 10/26/2022   Idiopathic hypotension 10/25/2022   Type 2 diabetes mellitus with stage 3a chronic kidney disease, without long-term current use of insulin (HCC) 07/09/2022   Hyperlipidemia LDL goal <100 08/24/2021   Encounter for general adult medical examination with abnormal findings 03/14/2021   Estrogen deficiency 03/09/2021   Cervical cancer screening 03/09/2021   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   Grade I diastolic dysfunction 03/12/2020   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   Osteoarthritis of glenohumeral joint 03/26/2019   Iron deficiency anemia 04/28/2018   OA (osteoarthritis) of knee 12/26/2017   PVC (premature ventricular contraction) 11/21/2017   Vitamin D deficiency, unspecified 10/13/2016   Primary osteoarthritis of left hand 06/30/2016   Osteoporosis without current pathological fracture 03/22/2016   Paraesophageal hiatal hernia s/p robotic repair & fundoplication 03/04/2016 03/04/2016   Bipolar 1 disorder, mixed, moderate (HCC) 01/29/2016   Class 1 obesity with body mass index (BMI) of 32.0 to 32.9 in adult 01/06/2016   Migraines 11/21/2014   OSA on CPAP 01/30/2007   GERD 11/02/2006   Hypothyroidism 10/07/2006   Bipolar affective (HCC) 10/07/2006   Essential hypertension 10/07/2006   RHINITIS, CHRONIC 10/07/2006   Seasonal and perennial allergic rhinitis 10/07/2006   Moderate persistent asthma, uncomplicated 10/07/2006   Irritable bowel syndrome 10/07/2006   INTERSTITIAL CYSTITIS 10/07/2006   ROSACEA 10/07/2006   Osteoarthritis of both knees 10/07/2006    History obtained from: chart review and patient.  Discussed the use of AI scribe software for clinical note transcription with the patient and/or guardian, who gave verbal consent to proceed.  [name] is a 67 y.o. female presenting for a follow up visit.  We last saw her in June 2024.  At that time,  we changed her from Symbicort to [facility] 2 puffs twice daily.  We continue with Singulair 10 mg daily.  For her allergic rhinitis, we continued with Singulair as well as carbinoxamine and Astelin.  Since last visit, she has done well.  Asthma/Respiratory Symptom History: The patient, with a history of respiratory issues, reports a positive response to a new inhaler, Breztri, which they believe is more effective than the previous medication, Symbicort. They note a decrease in the frequency of albuterol use, with only one recent episode of increased coughing due to suspected exposure to dust or mold.  She has not been on prednisone for breathing.  She has not been to the hospital.  She is on Xyzal, but she is not interested in stopping it.  She has never tried stopping it, but she did rather stay on it and stay stable.  Allergic Rhinitis Symptom History: The patient also reports occasional bouts of sneezing, typically five to six times in a row, which they attribute to exposure to unknown allergens. They are currently on Singulair and carbinoxamine for allergy management, which they believe are effective, although the carboxylamine causes dry mouth.  She has not been on antibiotics.  She has been hydrating with a decaffeinated iced tea as well as a lot of water.  The patient also has a history of artistic pursuits, including singing and sewing, but reports a recent loss of high notes in their singing due to vocal fold issues. They express frustration with this loss, as singing was a significant part of their life.  The patient's brother, [name], was recently incarcerated, which has caused significant emotional distress for the patient, leading to a temporary loss of appetite and subsequent weight loss. Despite this, the patient reports that they have returned to normal eating habits.  Her family has been rallying around [place] and ensuring that he is being treated appropriately in jail.  The patient also  mentions a family tradition of visiting [facility] every summer, which they were able to continue this year. They express a strong appreciation for the arts and nature, particularly during the autumn season.  She has never pursued painting, but she always talks in very artistic tones.  Otherwise, there have been no changes to her past medical history, surgical history, family history, or social history.    Review of systems otherwise negative other than that mentioned in the HPI.    Objective:   Blood pressure 132/80, pulse (!) 107, temperature (!) 97.4 F (36.3 C), temperature source Temporal, resp. rate 20, height 4\' 11"  (1.499 m), weight 163 lb 9.6 oz (74.2 kg), last menstrual period 03/05/2012, SpO2 95%. Body mass index is 33.04 kg/m.    Physical Exam Vitals reviewed.  Constitutional:      Appearance: She is well-developed.     Comments: Very pleasant.  Talkative.   HENT:     Head: Normocephalic and atraumatic.     Right Ear: Tympanic membrane, ear canal and external ear normal.     Left Ear: Tympanic membrane, ear canal and external ear normal.     Nose: No nasal deformity, septal deviation, mucosal edema or rhinorrhea.     Right Turbinates: Enlarged, swollen and pale.  Left Turbinates: Enlarged, swollen and pale.     Right Sinus: No maxillary sinus tenderness or frontal sinus tenderness.     Left Sinus: No maxillary sinus tenderness or frontal sinus tenderness.     Comments: No nasal polyps.    Mouth/Throat:     Lips: Pink.     Mouth: Mucous membranes are moist. Mucous membranes are not pale and not dry.     Pharynx: Uvula midline.  Eyes:     General: Lids are normal. No allergic shiner.       Right eye: No discharge.        Left eye: No discharge.     Conjunctiva/sclera: Conjunctivae normal.     Right eye: Right conjunctiva is not injected. No chemosis.    Left eye: Left conjunctiva is not injected. No chemosis.    Pupils: Pupils are equal, round, and  reactive to light.  Cardiovascular:     Rate and Rhythm: Normal rate and regular rhythm.     Heart sounds: Normal heart sounds.  Pulmonary:     Effort: Pulmonary effort is normal. No tachypnea, accessory muscle usage or respiratory distress.     Breath sounds: Normal breath sounds. No wheezing, rhonchi or rales.     Comments: Moving air well in all lung fields.  No increased work of breathing. No crackles noted.  Chest:     Chest wall: No tenderness.  Lymphadenopathy:     Cervical: No cervical adenopathy.  Skin:    General: Skin is warm.     Capillary Refill: Capillary refill takes less than 2 seconds.     Coloration: Skin is not pale.     Findings: No abrasion, erythema, petechiae or rash. Rash is not papular, urticarial or vesicular.     Comments: She does have the resolving lesion on her left lower extremity.  There is no erythema or discharge.  Neurological:     Mental Status: She is alert.  Psychiatric:        Behavior: Behavior is cooperative.      Diagnostic studies:    Spirometry: results normal (FEV1: 2.29/121%, FVC: 2.73/115%, FEV1/FVC: 84%).    Spirometry consistent with normal pattern.  Allergy Studies: none        Malachi Bonds, MD  [facility]

## 2022-11-19 DIAGNOSIS — G4733 Obstructive sleep apnea (adult) (pediatric): Secondary | ICD-10-CM | POA: Diagnosis not present

## 2022-11-23 ENCOUNTER — Ambulatory Visit (INDEPENDENT_AMBULATORY_CARE_PROVIDER_SITE_OTHER): Payer: Medicare HMO | Admitting: Internal Medicine

## 2022-11-23 ENCOUNTER — Ambulatory Visit: Payer: Medicare HMO | Admitting: Internal Medicine

## 2022-11-23 ENCOUNTER — Encounter: Payer: Self-pay | Admitting: Internal Medicine

## 2022-11-23 VITALS — BP 134/82 | HR 71 | Temp 98.3°F | Ht 59.0 in

## 2022-11-23 DIAGNOSIS — L233 Allergic contact dermatitis due to drugs in contact with skin: Secondary | ICD-10-CM | POA: Diagnosis not present

## 2022-11-23 DIAGNOSIS — F3132 Bipolar disorder, current episode depressed, moderate: Secondary | ICD-10-CM | POA: Diagnosis not present

## 2022-11-23 MED ORDER — HYDROXYZINE HCL 10 MG PO TABS
10.0000 mg | ORAL_TABLET | Freq: Three times a day (TID) | ORAL | 0 refills | Status: DC | PRN
Start: 2022-11-23 — End: 2023-04-28

## 2022-11-23 MED ORDER — FLUOCINONIDE EMULSIFIED BASE 0.05 % EX CREA
1.0000 | TOPICAL_CREAM | Freq: Two times a day (BID) | CUTANEOUS | 0 refills | Status: DC
Start: 2022-11-23 — End: 2023-03-24

## 2022-11-23 MED ORDER — METHYLPREDNISOLONE 4 MG PO TBPK
ORAL_TABLET | ORAL | 0 refills | Status: AC
Start: 2022-11-23 — End: 2022-11-29

## 2022-11-23 NOTE — Patient Instructions (Signed)
Contact Dermatitis Dermatitis is redness, soreness, and swelling (inflammation) of the skin. Contact dermatitis is a reaction to certain substances that touch the skin. There are two types of this condition: Irritant contact dermatitis. This is the most common type. It happens when something irritates your skin, such as when your hands get dry from washing them too often with soap. You can get this type of reaction even if you have not been exposed to the irritant before. Allergic contact dermatitis. This type is caused by a substance that you are allergic to, such as poison ivy. It occurs when you have been exposed to the substance (allergen) and form a sensitivity to it. In some cases, the reaction may start soon after your first exposure to the allergen. In other cases, it may not start until you are exposed to the allergen again. It may then occur every time you are exposed to the allergen in the future. What are the causes? Irritant contact dermatitis is often caused by exposure to: Makeup. Soaps, detergents, and bleaches. Acids. Metal salts, such as nickel. Allergic contact dermatitis is often caused by exposure to: Poisonous plants. Chemicals. Jewelry. Latex. Medicines. Preservatives in products, such as clothes. What increases the risk? You are more likely to get this condition if you have: A job that exposes you to irritants or allergens. Certain medical conditions. These include asthma and eczema. What are the signs or symptoms? Symptoms of this condition may occur in any place on your body that has been touched by the irritant. Symptoms include: Dryness, flaking, or cracking. Redness. Itching. Pain or a burning feeling. Blisters. Drainage of small amounts of blood or clear fluid from skin cracks. With allergic contact dermatitis, there may also be swelling in areas such as the eyelids, mouth, or genitals. How is this diagnosed? This condition is diagnosed with a medical  history and physical exam. A patch skin test may be done to help figure out the cause. If the condition is related to your job, you may need to see an expert in health problems in the workplace (occupational medicine specialist). How is this treated? This condition is treated by staying away from the cause of the reaction and protecting your skin from further contact. Treatment may also include: Steroid creams or ointments. Steroid medicines may need be taken by mouth (orally) in more severe cases. Antibiotics or medicines applied to the skin to kill bacteria (antibacterial ointments). These may be needed if a skin infection is present. Antihistamines. These may be taken orally or put on as a lotion to ease itching. A bandage (dressing). Follow these instructions at home: Skin care Moisturize your skin as needed. Put cool, wet cloths (cool compresses) on the affected areas. Try applying baking soda paste to your skin. Stir water into baking soda until it has the consistency of a paste. Do not scratch your skin. Avoid friction to the affected area. Avoid the use of soaps, perfumes, and dyes. Check the affected areas every day for signs of infection. Check for: More redness, swelling, or pain. More fluid or blood. Warmth. Pus or a bad smell. Medicines Take or apply over-the-counter and prescription medicines only as told by your health care provider. If you were prescribed antibiotics, take or apply them as told by your health care provider. Do not stop using the antibiotic even if you start to feel better. Bathing Try taking a bath with: Epsom salts. Follow the instructions on the packaging. You can get these at your local pharmacy  or grocery store. Baking soda. Pour a small amount into the bath as told by your health care provider. Colloidal oatmeal. Follow the instructions on the packaging. You can get this at your local pharmacy or grocery store. Bathe less often. This may mean bathing  every other day. Bathe in lukewarm water. Avoid using hot water. Bandage care If you were given a dressing, change it as told by your health care provider. Wash your hands with soap and water for at least 20 seconds before and after you change your dressing. If soap and water are not available, use hand sanitizer. General instructions Avoid the substance that caused your reaction. If you do not know what caused it, keep a journal to try to track what caused it. Write down: What you eat and drink. What cosmetics you use. What you wear in the affected area. This includes jewelry. Contact a health care provider if: Your condition does not get better with treatment. Your condition gets worse. You have any signs of infection. You have a fever. You have new symptoms. Your bone or joint under the affected area becomes painful after the skin has healed. Get help right away if: You notice red streaks coming from the affected area. The affected area turns darker. You have trouble breathing. This information is not intended to replace advice given to you by your health care provider. Make sure you discuss any questions you have with your health care provider. Document Revised: 07/31/2021 Document Reviewed: 07/31/2021 Elsevier Patient Education  2024 [employer].

## 2022-11-23 NOTE — Progress Notes (Unsigned)
Subjective:  Patient ID: [name], female    DOB: 1955/06/06  Age: 67 y.o. MRN: [id number]  CC: Rash   HPI [name] presents for a rash ----  Discussed the use of AI scribe software for clinical note transcription with the patient, who gave verbal consent to proceed.  History of Present Illness   The patient presents with a rash that began on Saturday morning. The rash, described as similar to hives, is located on the right neck/face and both sides of the chest. The rash started one day after an endoscopy procedure. She reports intense itching but denies any associated pain, stinging, or burning sensations. She has not experienced any fever or chills. The patient notes a 'poofy' appearance to the skin in the area of the rash. She has been managing the symptoms with prednisone cream. The patient has a history of shingles vaccination and denies any previous episodes of shingles.        Outpatient Medications Prior to Visit  Medication Sig Dispense Refill   albuterol (VENTOLIN HFA) 108 (90 Base) MCG/ACT inhaler INHALE 1-2 PUFFS INTO THE LUNGS DAILY AS NEEDED FOR WHEEZING OR SHORTNESS OF BREATH. 18 each 1   azelastine (ASTELIN) 0.1 % nasal spray Place 2 sprays into both nostrils 2 (two) times daily. Use in each nostril as directed 30 mL 5   Carbinoxamine Maleate 4 MG TABS TAKE 1 TABLET BY MOUTH EVERY 6 HOURS. 120 tablet 5   cariprazine (VRAYLAR) 3 MG capsule      clonazePAM (KLONOPIN) 1 MG tablet Take 1 mg by mouth daily as needed.     Daridorexant HCl 50 MG TABS Take 1 tablet by mouth at bedtime.     dicyclomine (BENTYL) 10 MG capsule TAKE 1 CAPSULE (10 MG TOTAL) BY MOUTH 3 (THREE) TIMES DAILY BEFORE MEALS. 270 capsule 1   DULoxetine (CYMBALTA) 60 MG capsule Take 60 mg by mouth 2 (two) times daily at 10 AM and 5 PM.     empagliflozin (JARDIANCE) 10 MG TABS tablet Take 1 tablet (10 mg total) by mouth daily before breakfast. 90 tablet 1   fluticasone (FLONASE) 50  MCG/ACT nasal spray Place 1 spray into both nostrils 2 (two) times daily as needed for allergies or rhinitis. 48 mL 1   hydrocortisone 2.5 % ointment Apply topically 2 (two) times daily.     levothyroxine (SYNTHROID) 25 MCG tablet Take 25 mcg by mouth daily.     metoprolol tartrate (LOPRESSOR) 25 MG tablet TAKE 1 TABLET BY MOUTH TWICE A DAY 180 tablet 0   montelukast (SINGULAIR) 10 MG tablet Take 1 tablet (10 mg total) by mouth at bedtime. 90 tablet 1   MYRBETRIQ 50 MG TB24 tablet Take 50 mg by mouth daily.     NURTEC 75 MG TBDP TAKE 1 TABLET BY MOUTH EVERY OTHER DAY 40 tablet 2   rosuvastatin (CRESTOR) 20 MG tablet TAKE 1 TABLET BY MOUTH EVERY DAY 90 tablet 0   spironolactone (ALDACTONE) 25 MG tablet Take 1 tablet (25 mg total) by mouth daily. 90 tablet 0   tiZANidine (ZANAFLEX) 2 MG tablet TAKE 1 TABLET (2MG  TOTAL) BY MOUTH TWICE A DAY AS NEEDED FOR MUSCLE SPASM 180 tablet 1   trihexyphenidyl (ARTANE) 2 MG tablet TAKE 1 TABLET BY MOUTH EVERY DAY 90 tablet 0   zinc gluconate 50 MG tablet Take 50 mg by mouth daily.     budesonide-formoterol (SYMBICORT) 160-4.5 MCG/ACT inhaler Inhale  2 puffs into the lungs in the morning and at bedtime. 3 each 5   No facility-administered medications prior to visit.    ROS Review of Systems  Constitutional:  Negative for chills, diaphoresis, fatigue and fever.  HENT: Negative.    Eyes: Negative.   Respiratory:  Negative for cough, chest tightness, shortness of breath and wheezing.   Cardiovascular:  Negative for chest pain, palpitations and leg swelling.  Gastrointestinal:  Negative for abdominal pain, diarrhea, nausea and vomiting.  Endocrine: Negative.   Genitourinary: Negative.  Negative for difficulty urinating.  Musculoskeletal: Negative.   Skin:  Positive for rash.  Neurological: Negative.  Negative for dizziness and weakness.  Hematological:  Negative for adenopathy. Does not bruise/bleed easily.  Psychiatric/Behavioral: Negative.       Objective:  BP 134/82 (BP Location: Left Arm, Patient Position: Sitting, Cuff Size: Large)   Pulse 71   Temp 98.3 F (36.8 C) (Oral)   Ht 4\' 11"  (1.499 m)   LMP 03/05/2012 (Approximate)   SpO2 97%   BMI 33.04 kg/m   BP Readings from Last 3 Encounters:  11/23/22 134/82  11/18/22 132/80  10/25/22 118/68    Wt Readings from Last 3 Encounters:  11/18/22 163 lb 9.6 oz (74.2 kg)  10/25/22 160 lb (72.6 kg)  10/08/22 162 lb (73.5 kg)    Physical Exam Vitals reviewed.  Constitutional:      Appearance: She is not ill-appearing.  HENT:     Mouth/Throat:     Mouth: Mucous membranes are moist.  Eyes:     General: No scleral icterus.    Conjunctiva/sclera: Conjunctivae normal.  Cardiovascular:     Rate and Rhythm: Normal rate and regular rhythm.     Heart sounds: No murmur heard. Pulmonary:     Effort: Pulmonary effort is normal.     Breath sounds: No stridor. No wheezing, rhonchi or rales.  Abdominal:     General: Abdomen is flat.     Palpations: There is no mass.     Tenderness: There is no abdominal tenderness. There is no guarding or rebound.     Hernia: No hernia is present.  Musculoskeletal:        General: Normal range of motion.     Cervical back: Neck supple.     Right lower leg: No edema.     Left lower leg: No edema.  Lymphadenopathy:     Cervical: No cervical adenopathy.  Skin:    Findings: Erythema and rash present. No abscess or lesion. Rash is macular and scaling. Rash is not crusting, nodular, papular, purpuric, urticarial or vesicular.  Neurological:     General: No focal deficit present.     Mental Status: She is alert. Mental status is at baseline.  Psychiatric:        Mood and Affect: Mood normal.        Behavior: Behavior normal.     Lab Results  Component Value Date   WBC 9.8 10/08/2022   HGB 11.3 (L) 10/08/2022   HCT 34.3 (L) 10/08/2022   PLT 302 10/08/2022   GLUCOSE 94 10/25/2022   CHOL 155 07/01/2022   TRIG 75.0 07/01/2022   HDL  68.40 07/01/2022   LDLDIRECT 143.0 09/14/2016   LDLCALC 72 07/01/2022   ALT 42 (H) 03/02/2022   AST 28 03/02/2022   NA 137 10/25/2022   K 4.1 10/25/2022   CL 102 10/25/2022   CREATININE 1.04 10/25/2022   BUN 8 10/25/2022   CO2 29 10/25/2022  TSH 0.33 (L) 10/25/2022   INR 0.94 12/21/2017   HGBA1C 6.8 (H) 10/25/2022   MICROALBUR <0.7 10/25/2022    MM 3D SCREENING MAMMOGRAM BILATERAL BREAST  Result Date: 07/26/2022 CLINICAL DATA:  Screening. EXAM: DIGITAL SCREENING BILATERAL MAMMOGRAM WITH TOMOSYNTHESIS AND CAD TECHNIQUE: Bilateral screening digital craniocaudal and mediolateral oblique mammograms were obtained. Bilateral screening digital breast tomosynthesis was performed. The images were evaluated with computer-aided detection. COMPARISON:  Previous exam(s). ACR Breast Density Category c: The breasts are heterogeneously dense, which may obscure small masses. FINDINGS: There are no findings suspicious for malignancy. IMPRESSION: No mammographic evidence of malignancy. A result letter of this screening mammogram will be mailed directly to the patient. RECOMMENDATION: Screening mammogram in one year. (Code:SM-B-01Y) BI-RADS CATEGORY  1: Negative. Electronically Signed   By: Elberta Fortis M.D.   On: 07/26/2022 13:48    Assessment & Plan:   Allergic contact dermatitis due to drugs in contact with skin -     methylPREDNISolone; TAKE AS DIRECTED  Dispense: 21 tablet; Refill: 0 -     hydrOXYzine HCl; Take 1 tablet (10 mg total) by mouth every 8 (eight) hours as needed.  Dispense: 30 tablet; Refill: 0 -     Fluocinonide Emulsified Base; Apply 1 Application topically 2 (two) times daily.  Dispense: 60 g; Refill: 0     Follow-up: Return in about 3 months (around 02/23/2023).  Diane Linger, MD

## 2022-12-06 ENCOUNTER — Telehealth: Payer: Self-pay | Admitting: Orthopaedic Surgery

## 2022-12-06 NOTE — Telephone Encounter (Signed)
Pt called in wanting to find out if we are still getting her left shoulder MRI order sent out so she can get them done together

## 2022-12-07 ENCOUNTER — Other Ambulatory Visit (INDEPENDENT_AMBULATORY_CARE_PROVIDER_SITE_OTHER): Payer: Medicare HMO

## 2022-12-07 DIAGNOSIS — D509 Iron deficiency anemia, unspecified: Secondary | ICD-10-CM

## 2022-12-07 LAB — HEMOCCULT SLIDES (X 3 CARDS)
Fecal Occult Blood: NEGATIVE
OCCULT 1: NEGATIVE
OCCULT 2: NEGATIVE
OCCULT 3: NEGATIVE
OCCULT 4: NEGATIVE
OCCULT 5: NEGATIVE

## 2022-12-22 NOTE — Telephone Encounter (Signed)
Pt authorization has come back with approval of [id number] Valid 11/12/22-05/13/23 pt is aware of the approval and number given to call imaging

## 2022-12-24 ENCOUNTER — Encounter: Payer: Self-pay | Admitting: Orthopaedic Surgery

## 2022-12-31 ENCOUNTER — Other Ambulatory Visit: Payer: Self-pay | Admitting: Internal Medicine

## 2022-12-31 DIAGNOSIS — I10 Essential (primary) hypertension: Secondary | ICD-10-CM

## 2023-01-02 ENCOUNTER — Other Ambulatory Visit: Payer: Self-pay | Admitting: Allergy & Immunology

## 2023-01-03 ENCOUNTER — Encounter: Payer: Self-pay | Admitting: Pulmonary Disease

## 2023-01-03 ENCOUNTER — Ambulatory Visit: Payer: Medicare HMO | Admitting: Pulmonary Disease

## 2023-01-03 VITALS — BP 124/80 | HR 84 | Temp 98.3°F | Ht 59.0 in | Wt 162.0 lb

## 2023-01-03 DIAGNOSIS — G4733 Obstructive sleep apnea (adult) (pediatric): Secondary | ICD-10-CM

## 2023-01-03 NOTE — Progress Notes (Unsigned)
[name]    [id number]    [birth year]  Primary Care Physician:Jones, Bernadene Bell, MD  Referring Physician: Etta Grandchild, MD [address] [place],  [place] [address]  Chief complaint:   Patient being seen for history of obstructive sleep apnea  HPI:  Sleep apnea diagnosed in 2017, noted to have mild sleep apnea Was on CPAP for many years  Patient had a fall about March, had injury to her nasal bridge and has not been able to tolerate CPAP since then  Has managed to lose about 50 pounds in the last year.  She did have a repeat study that shows an AHI of 15  Was referred to ENT for further evaluation and management of sleep disordered breathing  She did have a sleep induced endoscopy which she tolerated well and was felt to be appropriate for an inspire device She does not have an appointment to follow-up with Dr. Jenne Pane at the present time but was encouraged to make the appointment to follow-up with  She feels about the same She still feels that her sleep is nonrestorative  Difficulty initiating or maintaining sleep, dry mouth in the mornings Tiredness during the day  No morning headaches Denies significant memory issues  her health has been relatively good the last year  Outpatient Encounter Medications as of 01/03/2023  Medication Sig   albuterol (VENTOLIN HFA) 108 (90 Base) MCG/ACT inhaler INHALE 1-2 PUFFS INTO THE LUNGS DAILY AS NEEDED FOR WHEEZING OR SHORTNESS OF BREATH.   azelastine (ASTELIN) 0.1 % nasal spray Place 2 sprays into both nostrils 2 (two) times daily. Use in each nostril as directed   budesonide-formoterol (SYMBICORT) 160-4.5 MCG/ACT inhaler Inhale 2 puffs into the lungs in the morning and at bedtime.   Carbinoxamine Maleate 4 MG TABS TAKE 1 TABLET BY MOUTH EVERY 6 HOURS.   cariprazine (VRAYLAR) 3 MG capsule    clonazePAM (KLONOPIN) 1 MG tablet Take 1 mg by mouth daily as needed.   Daridorexant HCl 50 MG TABS Take 1 tablet by mouth  at bedtime.   dicyclomine (BENTYL) 10 MG capsule TAKE 1 CAPSULE (10 MG TOTAL) BY MOUTH 3 (THREE) TIMES DAILY BEFORE MEALS.   DULoxetine (CYMBALTA) 60 MG capsule Take 60 mg by mouth 2 (two) times daily at 10 AM and 5 PM.   empagliflozin (JARDIANCE) 10 MG TABS tablet Take 1 tablet (10 mg total) by mouth daily before breakfast.   fluocinonide-emollient (LIDEX-E) 0.05 % cream Apply 1 Application topically 2 (two) times daily.   fluticasone (FLONASE) 50 MCG/ACT nasal spray Place 1 spray into both nostrils 2 (two) times daily as needed for allergies or rhinitis.   hydrocortisone 2.5 % ointment Apply topically 2 (two) times daily.   hydrOXYzine (ATARAX) 10 MG tablet Take 1 tablet (10 mg total) by mouth every 8 (eight) hours as needed.   levothyroxine (SYNTHROID) 25 MCG tablet Take 25 mcg by mouth daily.   metoprolol tartrate (LOPRESSOR) 25 MG tablet TAKE 1 TABLET BY MOUTH TWICE A DAY   montelukast (SINGULAIR) 10 MG tablet Take 1 tablet (10 mg total) by mouth at bedtime.   MYRBETRIQ 50 MG TB24 tablet Take 50 mg by mouth daily.   NURTEC 75 MG TBDP TAKE 1 TABLET BY MOUTH EVERY OTHER DAY   rosuvastatin (CRESTOR) 20 MG tablet TAKE 1 TABLET BY MOUTH EVERY DAY   spironolactone (ALDACTONE) 25 MG tablet TAKE 1 TABLET (25 MG TOTAL) BY MOUTH DAILY.  tiZANidine (ZANAFLEX) 2 MG tablet TAKE 1 TABLET (2MG  TOTAL) BY MOUTH TWICE A DAY AS NEEDED FOR MUSCLE SPASM   trihexyphenidyl (ARTANE) 2 MG tablet TAKE 1 TABLET BY MOUTH EVERY DAY   zinc gluconate 50 MG tablet Take 50 mg by mouth daily.   No facility-administered encounter medications on file as of 01/03/2023.    Allergies as of 01/03/2023 - Review Complete 11/23/2022  Allergen Reaction Noted   Emetrol Itching 08/21/2018   Indomethacin Other (See Comments) 08/17/2016   Iodinated contrast media Itching 04/16/2011   Amlodipine Swelling 12/26/2014   Carbamazepine Other (See Comments) 04/28/2018   Pseudoephedrine Other (See Comments) 06/20/2014   Risperidone and  related Other (See Comments) 12/12/2012   Bupivacaine Hives 05/30/2013   Pentazocine Other (See Comments) 06/20/2014   Propoxyphene Itching and Nausea And Vomiting 06/20/2014   Sulfa antibiotics Other (See Comments) 05/24/2017   Tramadol Other (See Comments) 06/30/2016   Aspirin Other (See Comments) 02/15/2014   Codeine Itching and Other (See Comments) 05/30/2013   Etodolac Rash and Other (See Comments) 06/20/2014   Propranolol Nausea Only and Other (See Comments) 12/12/2012    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   Hypothyroidism    Intermittent palpitations    cardiology--  dr [place]   Interstitial cystitis    Limited jaw range of motion    s/p  bilateral TMJ surgery,  age 67s   Migraines    Moderate persistent asthma    pulmologist-- dr Craige Cotta   OA (osteoarthritis)    both knees   OSA on CPAP    per last sleep study 09/ 2017  mild OSA, AHI13/hr   PONV (postoperative nausea and vomiting)    PVC (premature ventricular contraction)    Rosacea    Sensation of pressure in bladder area    per pt intermittant   TMJ arthralgia    Urticaria    Wears glasses    White coat syndrome without hypertension    05-25-2017  per pt hx hypertension yrs ago, no issues after quitting stressful job    Past Surgical History:  Procedure Laterality Date   24 HOUR PH STUDY N/A 09/22/2015   Procedure: 24 HOUR PH STUDY;  Surgeon: Sherrilyn Rist, MD;  Location: [facility];   Service: Gastroenterology;  Laterality: N/A;   ADENOIDECTOMY     ANAL FISSURE REPAIR  age 67  (5)   BREAST EXCISIONAL BIOPSY Right 04-16-2003  dr p. young  [facility]   benign   BUNIONECTOMY Right early 2000s   CARPAL TUNNEL RELEASE Left age 67 (79)   CHOLECYSTECTOMY OPEN  age 17   CYSTO WITH HYDRODISTENSION N/A 06/02/2017   Procedure: CYSTOSCOPY/HYDRODISTENSION OF BLADDER;  Surgeon: Bjorn Pippin, MD;  Location: [facility];  Service: Urology;  Laterality: N/A;   CYSTO/ HYDRODISTENTION/  INSTILLATION THERAPY  1990s   DX LAPAROSCOPY/  DX HYSTEROSCOPY/  D & C  08-07-2007   dr dove  [facility]   ENDOMETRIAL ABLATION W/ NOVASURE  10-21-2008   dr Macon Large  [facility]   ESOPHAGEAL MANOMETRY N/A 09/22/2015   Procedure: ESOPHAGEAL MANOMETRY (EM);  Surgeon: Sherrilyn Rist, MD;  Location: [facility];  Service: Gastroenterology;  Laterality: N/A;  with impedence    FINGER ARTHROPLASTY Bilateral    left little finger 2016:  right middle finger 06/ 2018   KNEE ARTHROSCOPY Bilateral x3 left ;  x3  right-- last one age 54 (52)   NASAL SEPTUM SURGERY  age 66s   ROBOT ASSISTED REDUCTION PARAESOPHAGEAL HIATAL HERNIA/ TYPE 2 MEDIASTINAL DISSECTION/ PRIMARY HIATAL HERNIA REPAIR/ ANTERIOR & POSTERIOR GASTROPEXY/ NISSEN FUNDOPLICATION  03-04-2017   DR GROSS  [facility]   SINOSCOPY     TEMPOROMANDIBULAR JOINT SURGERY Bilateral x3  -- age 53s   per pt used graft   TONSILLECTOMY     TOTAL KNEE ARTHROPLASTY Left 12/26/2017   Procedure: LEFT TOTAL KNEE ARTHROPLASTY;  Surgeon: Ollen Gross, MD;  Location: [facility];  Service: Orthopedics;  Laterality: Left;    TRANSTHORACIC ECHOCARDIOGRAM  12-06-2017   dr [place]   ef 55-60%, grade 1 diastoilc dysfunction, trivial MR   TRIGGER FINGER RELEASE Bilateral last one 2017   several release's bilaterally   ULNAR NERVE TRANSPOSITION Bilateral age 78 (78)    Family History  Problem Relation Age of Onset   Hypertension Mother        deceased from MVA complications    Thyroid disease Mother    Allergic rhinitis Mother    Testicular cancer Father    Allergic rhinitis Father    Colon cancer Sister    Colon polyps Sister    Healthy Sister    Healthy Brother    Coronary artery disease Other    Stomach cancer Neg Hx     Social History   Socioeconomic History   Marital status: Divorced    Spouse name: Not on file   Number of children: 0   Years of education: Not on file   Highest education level: Bachelor's degree (e.g., BA, AB, BS)  Occupational History   Occupation: disability  Tobacco Use   Smoking status: Never    Passive exposure: Never   Smokeless tobacco: Never  Vaping Use   Vaping status: Never Used  Substance and Sexual Activity   Alcohol use: Yes    Alcohol/week: 0.0 standard drinks of alcohol    Comment: once every 3-4 months   Drug use: No   Sexual activity: Not Currently    Partners: Male    Birth control/protection: Post-menopausal  Other Topics Concern   Not on file  Social History Narrative   Not on file   Social Determinants of Health   Financial Resource Strain: Low Risk  (07/01/2022)   Overall Financial Resource Strain (CARDIA)    Difficulty of Paying Living Expenses: Not very hard  Food Insecurity: No Food Insecurity (07/01/2022)   Hunger Vital Sign    Worried About Running Out of Food in the Last Year: Never true    Ran Out of Food in the Last Year: Never true  Transportation Needs: No Transportation Needs (07/01/2022)   PRAPARE - Administrator, Civil Service (Medical): No    Lack of Transportation (Non-Medical): No  Physical Activity: Insufficiently Active (07/01/2022)   Exercise Vital Sign    Days of Exercise per Week: 2 days  Minutes of Exercise per Session: 30 min  Stress: No Stress Concern Present (07/01/2022)   [employer] of Occupational Health - Occupational Stress Questionnaire    Feeling of Stress : Not at all  Social Connections: Moderately Integrated (07/01/2022)   Social  Connection and Isolation Panel [NHANES]    Frequency of Communication with Friends and Family: Twice a week    Frequency of Social Gatherings with Friends and Family: Once a week    Attends Religious Services: More than 4 times per year    Active Member of [employer] or Organizations: Yes    Attends Banker Meetings: More than 4 times per year    Marital Status: Divorced  Intimate Partner Violence: Not At Risk (03/25/2022)   Humiliation, Afraid, Rape, and Kick questionnaire    Fear of Current or Ex-Partner: No    Emotionally Abused: No    Physically Abused: No    Sexually Abused: No    Review of Systems  Respiratory:  Positive for apnea. Negative for shortness of breath.   Psychiatric/Behavioral:  Positive for sleep disturbance.     There were no vitals filed for this visit.    Physical Exam Constitutional:      Appearance: She is obese.  HENT:     Head: Normocephalic.     Mouth/Throat:     Mouth: Mucous membranes are moist.     Comments: Mallampati 3 Eyes:     General: No scleral icterus.    Conjunctiva/sclera: Conjunctivae normal.  Cardiovascular:     Rate and Rhythm: Normal rate and regular rhythm.     Heart sounds: No murmur heard.    No friction rub.  Pulmonary:     Effort: No respiratory distress.     Breath sounds: No stridor. No wheezing or rhonchi.  Musculoskeletal:     Cervical back: No rigidity or tenderness.  Neurological:     Mental Status: She is alert.  Psychiatric:        Mood and Affect: Mood normal.      03/12/2022   10:00 AM  Results of the Epworth flowsheet  Sitting and reading 0  Watching TV 0  Sitting, inactive in a public place (e.g. a theatre or a meeting) 0  As a passenger in a car for an hour without a break 0  Lying down to rest in the afternoon when circumstances permit 1  Sitting and talking to someone 0  Sitting quietly after a lunch without alcohol 0  In a car, while stopped for a few minutes in traffic 0  Total score 1     Data Reviewed: Previous sleep study from 2017 reviewed showing AHI of 12.8  Most recent sleep study 02/28/2022 does reveal moderate obstructive sleep apnea  Assessment:  Moderate obstructive sleep apnea -Intolerant of CPAP -Followed up with ENT following which she had a sleep induced endoscopy performed -Awaiting follow-up  Her last study was reviewed showing an AHI of 15  An AHI of 15 should qualify the patient for an inspire device in the context of intolerance to CPAP  Still has moderate sleep apnea despite good amount of weight loss  Plan/Recommendations: Encouraged to make an appointment to follow-up with Dr. Jenne Pane  If a new sleep study is needed, a home sleep study can be ordered  Tentative follow-up in about 6 months  Encouraged to continue to optimize sleep hygiene as able   Virl Diamond MD [facility] 01/03/2023, 9:22 AM  CC: Yetta Barre,  Bernadene Bell, MD

## 2023-01-03 NOTE — Patient Instructions (Signed)
Follow-up in 6 months  Make sure you call Dr. Jenne Pane office to figure out next appointment and when you will be scheduled for implantation of inspire device  Your last sleep study was borderline, number of events was 15 which is the cutoff number to qualify you for the device  Call us with significant concerns  It appears that you will benefit from the device once it is placed from the recent evaluation by Dr. Jenne Pane

## 2023-01-17 ENCOUNTER — Telehealth: Payer: Self-pay | Admitting: Orthopaedic Surgery

## 2023-01-17 ENCOUNTER — Other Ambulatory Visit: Payer: Self-pay | Admitting: Orthopaedic Surgery

## 2023-01-17 MED ORDER — ALPRAZOLAM 0.5 MG PO TABS: 0.5000 mg | ORAL_TABLET | Freq: Once | ORAL | 0 refills | Status: AC

## 2023-01-17 NOTE — Telephone Encounter (Signed)
Patient called. She has a MRI 12/12. Would like something called in for her anxiety. Her cb# is [phone]

## 2023-01-18 DIAGNOSIS — F3132 Bipolar disorder, current episode depressed, moderate: Secondary | ICD-10-CM | POA: Diagnosis not present

## 2023-01-20 ENCOUNTER — Ambulatory Visit
Admission: RE | Admit: 2023-01-20 | Discharge: 2023-01-20 | Disposition: A | Discharge status: Home / Self Care | Payer: Medicare HMO | Source: Ambulatory Visit | Attending: Orthopaedic Surgery | Admitting: Orthopaedic Surgery

## 2023-01-20 DIAGNOSIS — G8929 Other chronic pain: Secondary | ICD-10-CM

## 2023-01-20 DIAGNOSIS — M7582 Other shoulder lesions, left shoulder: Secondary | ICD-10-CM | POA: Diagnosis not present

## 2023-01-20 DIAGNOSIS — M7581 Other shoulder lesions, right shoulder: Secondary | ICD-10-CM | POA: Diagnosis not present

## 2023-01-20 DIAGNOSIS — M7552 Bursitis of left shoulder: Secondary | ICD-10-CM | POA: Diagnosis not present

## 2023-01-20 DIAGNOSIS — M19011 Primary osteoarthritis, right shoulder: Secondary | ICD-10-CM | POA: Diagnosis not present

## 2023-01-24 ENCOUNTER — Other Ambulatory Visit: Payer: Self-pay | Admitting: Otolaryngology

## 2023-01-25 ENCOUNTER — Other Ambulatory Visit: Payer: Self-pay | Admitting: Allergy & Immunology

## 2023-01-25 DIAGNOSIS — L578 Other skin changes due to chronic exposure to nonionizing radiation: Secondary | ICD-10-CM | POA: Diagnosis not present

## 2023-01-25 DIAGNOSIS — Z85828 Personal history of other malignant neoplasm of skin: Secondary | ICD-10-CM | POA: Diagnosis not present

## 2023-01-25 DIAGNOSIS — D229 Melanocytic nevi, unspecified: Secondary | ICD-10-CM | POA: Diagnosis not present

## 2023-01-25 DIAGNOSIS — D485 Neoplasm of uncertain behavior of skin: Secondary | ICD-10-CM | POA: Diagnosis not present

## 2023-01-25 DIAGNOSIS — Z86018 Personal history of other benign neoplasm: Secondary | ICD-10-CM | POA: Diagnosis not present

## 2023-01-25 DIAGNOSIS — L821 Other seborrheic keratosis: Secondary | ICD-10-CM | POA: Diagnosis not present

## 2023-01-25 DIAGNOSIS — L814 Other melanin hyperpigmentation: Secondary | ICD-10-CM | POA: Diagnosis not present

## 2023-01-26 DIAGNOSIS — F3132 Bipolar disorder, current episode depressed, moderate: Secondary | ICD-10-CM | POA: Diagnosis not present

## 2023-01-26 DIAGNOSIS — F410 Panic disorder [episodic paroxysmal anxiety] without agoraphobia: Secondary | ICD-10-CM | POA: Diagnosis not present

## 2023-01-26 DIAGNOSIS — F411 Generalized anxiety disorder: Secondary | ICD-10-CM | POA: Diagnosis not present

## 2023-01-28 ENCOUNTER — Other Ambulatory Visit: Payer: Self-pay | Admitting: Internal Medicine

## 2023-01-28 ENCOUNTER — Other Ambulatory Visit: Payer: Self-pay | Admitting: Allergy & Immunology

## 2023-01-28 DIAGNOSIS — I10 Essential (primary) hypertension: Secondary | ICD-10-CM

## 2023-01-28 DIAGNOSIS — I493 Ventricular premature depolarization: Secondary | ICD-10-CM

## 2023-01-28 DIAGNOSIS — E039 Hypothyroidism, unspecified: Secondary | ICD-10-CM

## 2023-01-29 ENCOUNTER — Other Ambulatory Visit: Payer: Self-pay | Admitting: Allergy & Immunology

## 2023-02-08 ENCOUNTER — Other Ambulatory Visit: Payer: Self-pay | Admitting: Internal Medicine

## 2023-02-08 DIAGNOSIS — G43109 Migraine with aura, not intractable, without status migrainosus: Secondary | ICD-10-CM

## 2023-02-08 DIAGNOSIS — E785 Hyperlipidemia, unspecified: Secondary | ICD-10-CM

## 2023-02-11 DIAGNOSIS — F3132 Bipolar disorder, current episode depressed, moderate: Secondary | ICD-10-CM | POA: Diagnosis not present

## 2023-02-17 ENCOUNTER — Other Ambulatory Visit: Payer: Self-pay | Admitting: Orthopaedic Surgery

## 2023-02-22 DIAGNOSIS — G4733 Obstructive sleep apnea (adult) (pediatric): Secondary | ICD-10-CM | POA: Diagnosis not present

## 2023-02-23 ENCOUNTER — Ambulatory Visit: Payer: Medicare HMO | Admitting: Internal Medicine

## 2023-02-23 DIAGNOSIS — F3132 Bipolar disorder, current episode depressed, moderate: Secondary | ICD-10-CM | POA: Diagnosis not present

## 2023-03-02 ENCOUNTER — Other Ambulatory Visit: Payer: Self-pay

## 2023-03-02 ENCOUNTER — Encounter (HOSPITAL_BASED_OUTPATIENT_CLINIC_OR_DEPARTMENT_OTHER): Payer: Self-pay | Admitting: Otolaryngology

## 2023-03-03 ENCOUNTER — Encounter: Payer: Self-pay | Admitting: Orthopaedic Surgery

## 2023-03-03 ENCOUNTER — Ambulatory Visit: Payer: Medicare HMO | Admitting: Orthopaedic Surgery

## 2023-03-03 DIAGNOSIS — M25512 Pain in left shoulder: Secondary | ICD-10-CM | POA: Diagnosis not present

## 2023-03-03 DIAGNOSIS — G8929 Other chronic pain: Secondary | ICD-10-CM | POA: Diagnosis not present

## 2023-03-03 DIAGNOSIS — M25511 Pain in right shoulder: Secondary | ICD-10-CM

## 2023-03-03 NOTE — Progress Notes (Signed)
The patient is a 68 year old female well-known to me.  She is following up after having MRIs of both of her shoulders.  Her right shoulder is the shoulder that bothers her much more than the left side.  Her right side he does have some grinding and referred pain down into the right upper arm.  It is detrimentally affecting her activities of daily living with reaching overhead and behind.  Her left shoulder has some pain and more weakness on the left side than the right but the right is significant more painful.  The MRI of the right shoulder shows moderate to really severe glenohumeral arthritis.  There is significant tendinosis of the rotator cuff but no significant tearing of the rotator cuff.  The left shoulder itself shows moderate tendinosis of the rotator cuff with partial to near full-thickness rotator cuff tearing.  Significant tendinosis of the instrument is tendon and mild to moderate osteoarthritis of the glenohumeral joint.  When I examine her shoulders her right shoulder definitely shows some grinding of the glenohumeral joint.  It does move well but it is significantly painful to her.  Her left shoulder moves more smoothly in terms of the glenohumeral motion and mobility and there is some weakness in the cuff.  I feel that more of her left shoulder pain is related to her cuff and her tendinosis.  The right shoulder pain seems to be more related to glenohumeral arthritis.  I did give her a copy of her MRI reports and explain what this means using a shoulder model.  I would like to send her to my partner Dr. August Saucer for his evaluation and further treatment of her significant shoulder disease.  I did let her know that he may end up recommending a shoulder replacement for her right shoulder but we need to leave that to his judgment and expertise.  She agrees with this referral.  She does state that the tizanidine has helped her some as well.

## 2023-03-08 ENCOUNTER — Encounter (HOSPITAL_BASED_OUTPATIENT_CLINIC_OR_DEPARTMENT_OTHER)
Admission: RE | Admit: 2023-03-08 | Discharge: 2023-03-08 | Disposition: A | Discharge status: Home / Self Care | Payer: Medicare HMO | Source: Ambulatory Visit | Attending: Otolaryngology | Admitting: Otolaryngology

## 2023-03-08 DIAGNOSIS — E66811 Obesity, class 1: Secondary | ICD-10-CM | POA: Diagnosis not present

## 2023-03-08 DIAGNOSIS — I10 Essential (primary) hypertension: Secondary | ICD-10-CM | POA: Diagnosis not present

## 2023-03-08 DIAGNOSIS — G4733 Obstructive sleep apnea (adult) (pediatric): Secondary | ICD-10-CM | POA: Diagnosis present

## 2023-03-08 DIAGNOSIS — E785 Hyperlipidemia, unspecified: Secondary | ICD-10-CM | POA: Diagnosis not present

## 2023-03-08 DIAGNOSIS — F419 Anxiety disorder, unspecified: Secondary | ICD-10-CM | POA: Diagnosis not present

## 2023-03-08 DIAGNOSIS — Z8249 Family history of ischemic heart disease and other diseases of the circulatory system: Secondary | ICD-10-CM | POA: Diagnosis not present

## 2023-03-08 DIAGNOSIS — Z6831 Body mass index (BMI) 31.0-31.9, adult: Secondary | ICD-10-CM | POA: Diagnosis not present

## 2023-03-08 DIAGNOSIS — F319 Bipolar disorder, unspecified: Secondary | ICD-10-CM | POA: Diagnosis not present

## 2023-03-08 DIAGNOSIS — M1990 Unspecified osteoarthritis, unspecified site: Secondary | ICD-10-CM | POA: Diagnosis not present

## 2023-03-08 DIAGNOSIS — J4540 Moderate persistent asthma, uncomplicated: Secondary | ICD-10-CM | POA: Diagnosis not present

## 2023-03-08 DIAGNOSIS — Z79899 Other long term (current) drug therapy: Secondary | ICD-10-CM | POA: Diagnosis not present

## 2023-03-08 DIAGNOSIS — Z7984 Long term (current) use of oral hypoglycemic drugs: Secondary | ICD-10-CM | POA: Diagnosis not present

## 2023-03-08 DIAGNOSIS — E119 Type 2 diabetes mellitus without complications: Secondary | ICD-10-CM | POA: Diagnosis not present

## 2023-03-08 LAB — BASIC METABOLIC PANEL
Anion gap: 8 (ref 5–15)
BUN: 13 mg/dL (ref 8–23)
CO2: 27 mmol/L (ref 22–32)
Calcium: 9.7 mg/dL (ref 8.9–10.3)
Chloride: 104 mmol/L (ref 98–111)
Creatinine, Ser: 1.02 mg/dL — ABNORMAL HIGH (ref 0.44–1.00)
GFR, Estimated: >60 mL/min (ref >60)
Glucose, Bld: 123 mg/dL — ABNORMAL HIGH (ref 70–99)
Potassium: 3.9 mmol/L (ref 3.5–5.1)
Sodium: 139 mmol/L (ref 135–145)

## 2023-03-08 NOTE — Progress Notes (Signed)

## 2023-03-08 NOTE — Anesthesia Preprocedure Evaluation (Signed)
Anesthesia Evaluation  Patient identified by MRN, date of birth, ID band Patient awake    Reviewed: Allergy & Precautions, NPO status , Patient's Chart, lab work & pertinent test results  History of Anesthesia Complications (+) PONV and history of anesthetic complications  Airway Mallampati: IV  TM Distance: >3 FB Neck ROM: Full   Comment: Previous grade I view with Glidescope 3, easy mask with [facility] Dental  (+) Dental Advisory Given,    Pulmonary neg shortness of breath, asthma (no recent flares) , sleep apnea , neg COPD, neg recent URI   Pulmonary exam normal breath sounds clear to auscultation       Cardiovascular hypertension (metoprolol), Pt. on home beta blockers (-) angina (-) Past MI, (-) Cardiac Stents and (-) CABG + dysrhythmias (PVCs)  Rhythm:Regular Rate:Normal  HLD  TTE 12/06/2017: Study Conclusions   - Left ventricle: The cavity size was normal. Wall thickness was    normal. Systolic function was normal. The estimated ejection    fraction was in the range of 55% to 60%. Wall motion was normal;    there were no regional wall motion abnormalities. Doppler    parameters are consistent with abnormal left ventricular    relaxation (grade 1 diastolic dysfunction).     Neuro/Psych  Headaches PSYCHIATRIC DISORDERS (panic attacks) Anxiety Depression Bipolar Disorder   Essential tremor    GI/Hepatic Neg liver ROS, hiatal hernia (s/p repair),GERD  ,,  Endo/Other  diabetes, Type 2, Oral Hypoglycemic AgentsHypothyroidism    Renal/GU CRFRenal disease     Musculoskeletal  (+) Arthritis , Osteoarthritis,    Abdominal  (+) + obese  Peds  Hematology  (+) Blood dyscrasia, anemia Lab Results      Component                Value               Date                      WBC                      9.8                 10/08/2022                HGB                      11.3 (L)            10/08/2022                HCT                       34.3 (L)            10/08/2022                MCV                      85.8                10/08/2022                PLT                      302                 10/08/2022  Anesthesia Other Findings Last Jardiance: yesterday  Reproductive/Obstetrics                             Anesthesia Physical Anesthesia Plan  ASA: 3  Anesthesia Plan: General   Post-op Pain Management: Tylenol PO (pre-op)*   Induction: Intravenous  PONV Risk Score and Plan: 4 or greater and Ondansetron, Dexamethasone and Treatment may vary due to age or medical condition  Airway Management Planned: Oral ETT  Additional Equipment:   Intra-op Plan:   Post-operative Plan: Extubation in OR  Informed Consent: I have reviewed the patients History and Physical, chart, labs and discussed the procedure including the risks, benefits and alternatives for the proposed anesthesia with the patient or authorized representative who has indicated his/her understanding and acceptance.     Dental advisory given  Plan Discussed with: CRNA and Anesthesiologist  Anesthesia Plan Comments: (Risks of general anesthesia discussed including, but not limited to, sore throat, hoarse voice, chipped/damaged teeth, injury to vocal cords, nausea and vomiting, allergic reactions, lung infection, heart attack, stroke, and death. All questions answered. )        Anesthesia Quick Evaluation

## 2023-03-09 ENCOUNTER — Encounter (HOSPITAL_BASED_OUTPATIENT_CLINIC_OR_DEPARTMENT_OTHER): Payer: Self-pay | Admitting: Otolaryngology

## 2023-03-09 ENCOUNTER — Other Ambulatory Visit: Payer: Self-pay

## 2023-03-09 ENCOUNTER — Encounter (HOSPITAL_BASED_OUTPATIENT_CLINIC_OR_DEPARTMENT_OTHER)
Admission: RE | Disposition: A | Discharge status: Home / Self Care | Payer: Self-pay | Source: Ambulatory Visit | Attending: Otolaryngology

## 2023-03-09 ENCOUNTER — Ambulatory Visit (HOSPITAL_BASED_OUTPATIENT_CLINIC_OR_DEPARTMENT_OTHER): Payer: Self-pay | Admitting: Anesthesiology

## 2023-03-09 ENCOUNTER — Ambulatory Visit (HOSPITAL_BASED_OUTPATIENT_CLINIC_OR_DEPARTMENT_OTHER)
Admission: RE | Admit: 2023-03-09 | Discharge: 2023-03-09 | Disposition: A | Discharge status: Home / Self Care | Payer: Medicare HMO | Source: Ambulatory Visit | Attending: Otolaryngology | Admitting: Otolaryngology

## 2023-03-09 ENCOUNTER — Ambulatory Visit (HOSPITAL_COMMUNITY): Payer: Medicare HMO

## 2023-03-09 ENCOUNTER — Other Ambulatory Visit: Payer: Self-pay | Admitting: Internal Medicine

## 2023-03-09 ENCOUNTER — Other Ambulatory Visit: Payer: Self-pay | Admitting: Allergy & Immunology

## 2023-03-09 DIAGNOSIS — E119 Type 2 diabetes mellitus without complications: Secondary | ICD-10-CM | POA: Diagnosis not present

## 2023-03-09 DIAGNOSIS — F319 Bipolar disorder, unspecified: Secondary | ICD-10-CM | POA: Insufficient documentation

## 2023-03-09 DIAGNOSIS — G4733 Obstructive sleep apnea (adult) (pediatric): Secondary | ICD-10-CM | POA: Insufficient documentation

## 2023-03-09 DIAGNOSIS — Z7984 Long term (current) use of oral hypoglycemic drugs: Secondary | ICD-10-CM | POA: Insufficient documentation

## 2023-03-09 DIAGNOSIS — I10 Essential (primary) hypertension: Secondary | ICD-10-CM

## 2023-03-09 DIAGNOSIS — Z79899 Other long term (current) drug therapy: Secondary | ICD-10-CM | POA: Insufficient documentation

## 2023-03-09 DIAGNOSIS — J4540 Moderate persistent asthma, uncomplicated: Secondary | ICD-10-CM | POA: Insufficient documentation

## 2023-03-09 DIAGNOSIS — J45909 Unspecified asthma, uncomplicated: Secondary | ICD-10-CM

## 2023-03-09 DIAGNOSIS — Z6831 Body mass index (BMI) 31.0-31.9, adult: Secondary | ICD-10-CM | POA: Diagnosis not present

## 2023-03-09 DIAGNOSIS — M1990 Unspecified osteoarthritis, unspecified site: Secondary | ICD-10-CM | POA: Insufficient documentation

## 2023-03-09 DIAGNOSIS — F419 Anxiety disorder, unspecified: Secondary | ICD-10-CM | POA: Insufficient documentation

## 2023-03-09 DIAGNOSIS — E66811 Obesity, class 1: Secondary | ICD-10-CM | POA: Insufficient documentation

## 2023-03-09 DIAGNOSIS — E785 Hyperlipidemia, unspecified: Secondary | ICD-10-CM | POA: Insufficient documentation

## 2023-03-09 DIAGNOSIS — Z8249 Family history of ischemic heart disease and other diseases of the circulatory system: Secondary | ICD-10-CM | POA: Insufficient documentation

## 2023-03-09 DIAGNOSIS — I493 Ventricular premature depolarization: Secondary | ICD-10-CM

## 2023-03-09 HISTORY — PX: IMPLANTATION OF HYPOGLOSSAL NERVE STIMULATOR: SHX6827

## 2023-03-09 LAB — GLUCOSE, CAPILLARY
Glucose-Capillary: 108 mg/dL — ABNORMAL HIGH (ref 70–99)
Glucose-Capillary: 145 mg/dL — ABNORMAL HIGH (ref 70–99)

## 2023-03-09 SURGERY — INSERTION, HYPOGLOSSAL NERVE STIMULATOR
Anesthesia: General | Site: Neck | Laterality: Right

## 2023-03-09 MED ORDER — LIDOCAINE-EPINEPHRINE 1 %-1:100000 IJ SOLN
INTRAMUSCULAR | Status: AC
  Filled 2023-03-09: qty 3

## 2023-03-09 MED ORDER — AMISULPRIDE (ANTIEMETIC) 5 MG/2ML IV SOLN
10.0000 mg | Freq: Once | INTRAVENOUS | Status: DC | PRN
Start: 2023-03-09 — End: 2023-03-09

## 2023-03-09 MED ORDER — SUCCINYLCHOLINE CHLORIDE 200 MG/10ML IV SOSY
PREFILLED_SYRINGE | INTRAVENOUS | Status: DC | PRN
  Administered 2023-03-09: 120 mg via INTRAVENOUS
  Administered 2023-03-09: 40 mg via INTRAVENOUS

## 2023-03-09 MED ORDER — FENTANYL CITRATE (PF) 100 MCG/2ML IJ SOLN
INTRAMUSCULAR | Status: AC
  Filled 2023-03-09: qty 2

## 2023-03-09 MED ORDER — OXYCODONE HCL 5 MG/5ML PO SOLN: 5.0000 mg | Freq: Once | ORAL | Status: DC | PRN

## 2023-03-09 MED ORDER — DEXAMETHASONE SODIUM PHOSPHATE 4 MG/ML IJ SOLN
INTRAMUSCULAR | Status: DC | PRN
  Administered 2023-03-09: 10 mg via INTRAVENOUS

## 2023-03-09 MED ORDER — 0.9 % SODIUM CHLORIDE (POUR BTL) OPTIME
TOPICAL | Status: DC | PRN
  Administered 2023-03-09: 600 mL

## 2023-03-09 MED ORDER — LIDOCAINE 2% (20 MG/ML) 5 ML SYRINGE
INTRAMUSCULAR | Status: AC
  Filled 2023-03-09: qty 5

## 2023-03-09 MED ORDER — PROPOFOL 10 MG/ML IV BOLUS
INTRAVENOUS | Status: DC | PRN
  Administered 2023-03-09: 50 mg via INTRAVENOUS
  Administered 2023-03-09 (×2): 150 mg via INTRAVENOUS

## 2023-03-09 MED ORDER — LIDOCAINE 2% (20 MG/ML) 5 ML SYRINGE
INTRAMUSCULAR | Status: DC | PRN
  Administered 2023-03-09: 80 mg via INTRAVENOUS

## 2023-03-09 MED ORDER — PROPOFOL 500 MG/50ML IV EMUL
INTRAVENOUS | Status: DC | PRN
  Administered 2023-03-09: 100 ug/kg/min via INTRAVENOUS

## 2023-03-09 MED ORDER — ROCURONIUM BROMIDE 10 MG/ML (PF) SYRINGE
PREFILLED_SYRINGE | INTRAVENOUS | Status: AC
  Filled 2023-03-09: qty 10

## 2023-03-09 MED ORDER — HYDROCODONE-ACETAMINOPHEN 5-325 MG PO TABS: 1.0000 | ORAL_TABLET | Freq: Four times a day (QID) | ORAL | 0 refills | Status: DC | PRN

## 2023-03-09 MED ORDER — ONDANSETRON HCL 4 MG/2ML IJ SOLN
INTRAMUSCULAR | Status: AC
  Filled 2023-03-09: qty 2

## 2023-03-09 MED ORDER — VANCOMYCIN HCL IN DEXTROSE 1-5 GM/200ML-% IV SOLN
INTRAVENOUS | Status: AC
  Filled 2023-03-09: qty 200

## 2023-03-09 MED ORDER — SODIUM CHLORIDE (PF) 0.9 % IJ SOLN
INTRAMUSCULAR | Status: AC
  Filled 2023-03-09: qty 10

## 2023-03-09 MED ORDER — SODIUM CHLORIDE (PF) 0.9 % IJ SOLN
INTRAMUSCULAR | Status: DC | PRN
  Administered 2023-03-09: 5 mL

## 2023-03-09 MED ORDER — VANCOMYCIN HCL IN DEXTROSE 1-5 GM/200ML-% IV SOLN
1000.0000 mg | INTRAVENOUS | Status: AC
  Administered 2023-03-09: 1000 mg via INTRAVENOUS

## 2023-03-09 MED ORDER — ACETAMINOPHEN 500 MG PO TABS
1000.0000 mg | ORAL_TABLET | Freq: Once | ORAL | Status: AC
  Administered 2023-03-09: 1000 mg via ORAL

## 2023-03-09 MED ORDER — OXYCODONE HCL 5 MG PO TABS: 5.0000 mg | ORAL_TABLET | Freq: Once | ORAL | Status: DC | PRN

## 2023-03-09 MED ORDER — LACTATED RINGERS IV SOLN: INTRAVENOUS | Status: DC

## 2023-03-09 MED ORDER — MIDAZOLAM HCL 2 MG/2ML IJ SOLN
INTRAMUSCULAR | Status: AC
  Filled 2023-03-09: qty 2

## 2023-03-09 MED ORDER — EPINEPHRINE PF 1 MG/ML IJ SOLN
INTRAMUSCULAR | Status: AC
  Filled 2023-03-09: qty 1

## 2023-03-09 MED ORDER — ACETAMINOPHEN 500 MG PO TABS
ORAL_TABLET | ORAL | Status: AC
  Filled 2023-03-09: qty 2

## 2023-03-09 MED ORDER — FENTANYL CITRATE (PF) 100 MCG/2ML IJ SOLN: 25.0000 ug | INTRAMUSCULAR | Status: DC | PRN

## 2023-03-09 MED ORDER — MIDAZOLAM HCL 5 MG/5ML IJ SOLN
INTRAMUSCULAR | Status: DC | PRN
  Administered 2023-03-09: 2 mg via INTRAVENOUS

## 2023-03-09 MED ORDER — DEXAMETHASONE SODIUM PHOSPHATE 10 MG/ML IJ SOLN
INTRAMUSCULAR | Status: AC
  Filled 2023-03-09: qty 1

## 2023-03-09 MED ORDER — FENTANYL CITRATE (PF) 100 MCG/2ML IJ SOLN
INTRAMUSCULAR | Status: DC | PRN
  Administered 2023-03-09 (×4): 50 ug via INTRAVENOUS

## 2023-03-09 SURGICAL SUPPLY — 60 items
BLADE CLIPPER SURG (BLADE) IMPLANT
BLADE SURG 15 STRL LF DISP TIS (BLADE) ×1 IMPLANT
CANISTER SUCT 1200ML W/VALVE (MISCELLANEOUS) ×1 IMPLANT
CORD BIPOLAR FORCEPS 12FT (ELECTRODE) ×1 IMPLANT
COVER PROBE CYLINDRICAL 5X96 (MISCELLANEOUS) ×1 IMPLANT
DERMABOND ADVANCED .7 DNX12 (GAUZE/BANDAGES/DRESSINGS) ×2 IMPLANT
DRAPE C-ARM 35X43 STRL (DRAPES) ×1 IMPLANT
DRAPE HEAD BAR (DRAPES) IMPLANT
DRAPE INCISE IOBAN 66X45 STRL (DRAPES) ×1 IMPLANT
DRAPE MICROSCOPE WILD 40.5X102 (DRAPES) ×1 IMPLANT
DRAPE UTILITY XL STRL (DRAPES) ×1 IMPLANT
DRSG TEGADERM 2-3/8X2-3/4 SM (GAUZE/BANDAGES/DRESSINGS) ×2 IMPLANT
DRSG TEGADERM 4X4.75 (GAUZE/BANDAGES/DRESSINGS) IMPLANT
ELECT COATED BLADE 2.86 ST (ELECTRODE) ×1 IMPLANT
ELECT EMG 18 NIMS (NEUROSURGERY SUPPLIES) ×1
ELECT REM PT RETURN 9FT ADLT (ELECTROSURGICAL) ×1
ELECTRODE EMG 18 NIMS (NEUROSURGERY SUPPLIES) ×1 IMPLANT
ELECTRODE REM PT RTRN 9FT ADLT (ELECTROSURGICAL) ×1 IMPLANT
FORCEPS BIPOLAR SPETZLER 8 1.0 (NEUROSURGERY SUPPLIES) ×1 IMPLANT
GAUZE 4X4 16PLY ~~LOC~~+RFID DBL (SPONGE) ×1 IMPLANT
GAUZE SPONGE 4X4 12PLY STRL (GAUZE/BANDAGES/DRESSINGS) ×1 IMPLANT
GENERATOR PULSE INSPIRE (Generator) ×1 IMPLANT
GENERATOR PULSE INSPIRE IV (Generator) ×1 IMPLANT
GLOVE BIO SURGEON STRL SZ 6.5 (GLOVE) IMPLANT
GLOVE BIO SURGEON STRL SZ7.5 (GLOVE) ×1 IMPLANT
GOWN STRL REUS W/ TWL LRG LVL3 (GOWN DISPOSABLE) ×3 IMPLANT
IV CATH 18G SAFETY (IV SOLUTION) ×1 IMPLANT
KIT NEURO ACCESSORY W/WRENCH (MISCELLANEOUS) IMPLANT
LEAD SENSING RESP INSPIRE (Lead) ×1 IMPLANT
LEAD SENSING RESP INSPIRE IV (Lead) ×1 IMPLANT
LEAD SLEEP STIM INSPIRE IV/V (Lead) ×1 IMPLANT
LEAD SLEEP STIMULATION INSPIRE (Lead) ×1 IMPLANT
LOOP VASCLR MAXI BLUE 18IN ST (MISCELLANEOUS) ×1 IMPLANT
LOOP VESSEL MINI RED (MISCELLANEOUS) ×1 IMPLANT
MARKER SKIN DUAL TIP RULER LAB (MISCELLANEOUS) ×1 IMPLANT
NDL HYPO 25X1 1.5 SAFETY (NEEDLE) ×1 IMPLANT
NEEDLE HYPO 25X1 1.5 SAFETY (NEEDLE) ×1
NS IRRIG 1000ML POUR BTL (IV SOLUTION) ×1 IMPLANT
PACK BASIN DAY SURGERY FS (CUSTOM PROCEDURE TRAY) ×1 IMPLANT
PACK ENT DAY SURGERY (CUSTOM PROCEDURE TRAY) ×1 IMPLANT
PASSER CATH 36 CODMAN DISP (NEUROSURGERY SUPPLIES) IMPLANT
PASSER CATH 38CM DISP (INSTRUMENTS) IMPLANT
PENCIL SMOKE EVACUATOR (MISCELLANEOUS) ×1 IMPLANT
PROBE NERVE STIMULATOR (NEUROSURGERY SUPPLIES) ×1 IMPLANT
REMOTE CONTROL SLEEP INSPIRE (MISCELLANEOUS) ×1 IMPLANT
SET WALTER ACTIVATION W/DRAPE (SET/KITS/TRAYS/PACK) ×1 IMPLANT
SLEEVE SCD COMPRESS KNEE MED (STOCKING) ×1 IMPLANT
SPONGE INTESTINAL PEANUT (DISPOSABLE) ×1 IMPLANT
SUT SILK 2 0 SH (SUTURE) ×1 IMPLANT
SUT SILK 3 0 REEL (SUTURE) ×1 IMPLANT
SUT SILK 3 0 SH 30 (SUTURE) ×1 IMPLANT
SUT SILK 3-0 RB1 30XBRD (SUTURE) ×1
SUT VIC AB 3-0 SH 27X BRD (SUTURE) ×2 IMPLANT
SUT VIC AB 4-0 PS2 27 (SUTURE) ×2 IMPLANT
SUTURE SILK 3-0 RB1 30XBRD (SUTURE) ×1 IMPLANT
SYR 10ML LL (SYRINGE) ×1 IMPLANT
SYR BULB EAR ULCER 3OZ GRN STR (SYRINGE) ×1 IMPLANT
TIE VASCULAR MAXI BLUE 18IN ST (MISCELLANEOUS) ×1
TOWEL GREEN STERILE FF (TOWEL DISPOSABLE) ×2 IMPLANT
VASCULAR TIE MAXI BLUE 18IN ST (MISCELLANEOUS) ×1

## 2023-03-09 NOTE — Op Note (Signed)

## 2023-03-09 NOTE — Transfer of Care (Signed)
Immediate Anesthesia Transfer of Care Note  Patient: [name]  Procedure(s) Performed: RIGHT IMPLANTATION OF HYPOGLOSSAL NERVE STIMULATOR (Right: Neck)  Patient Location: PACU  Anesthesia Type:General  Level of Consciousness: drowsy and patient cooperative  Airway & Oxygen Therapy: Patient Spontanous Breathing and Patient connected to face mask oxygen  Post-op Assessment: Report given to RN and Post -op Vital signs reviewed and stable  Post vital signs: Reviewed and stable  Last Vitals:  Vitals Value Taken Time  BP    Temp    Pulse    Resp    SpO2      Last Pain:  Vitals:   03/09/23 0645  PainSc: 4       Patients Stated Pain Goal: 4 (03/09/23 0645)  Complications:  Encounter Notable Events  Notable Event Outcome Phase Comment  Difficult to intubate - expected  Intraprocedure Filed from anesthesia note documentation.

## 2023-03-09 NOTE — Anesthesia Postprocedure Evaluation (Signed)
Anesthesia Post Note  Patient: [name]  Procedure(s) Performed: RIGHT IMPLANTATION OF HYPOGLOSSAL NERVE STIMULATOR (Right: Neck)     Patient location during evaluation: PACU Anesthesia Type: General Level of consciousness: awake Pain management: pain level controlled Vital Signs Assessment: post-procedure vital signs reviewed and stable Respiratory status: spontaneous breathing, nonlabored ventilation and respiratory function stable Cardiovascular status: blood pressure returned to baseline and stable Postop Assessment: no apparent nausea or vomiting Anesthetic complications: yes   Encounter Notable Events  Notable Event Outcome Phase Comment  Difficult to intubate - expected  Intraprocedure Filed from anesthesia note documentation.    Last Vitals:  Vitals:   03/09/23 1015 03/09/23 1053  BP: 137/76 (!) 146/80  Pulse: (!) 101 99  Resp: 18 16  Temp:  (!) 36.2 C  SpO2: 100% 96%    Last Pain:  Vitals:   03/09/23 1053  PainSc: 3                  Linton Rump

## 2023-03-09 NOTE — Brief Op Note (Signed)
03/09/2023  9:32 AM  PATIENT:  [name]  68 y.o. female  PRE-OPERATIVE DIAGNOSIS:  Obstructive sleep apnea  POST-OPERATIVE DIAGNOSIS:  Obstructive sleep apnea  PROCEDURE:  Procedure(s): RIGHT IMPLANTATION OF HYPOGLOSSAL NERVE STIMULATOR (Right)  SURGEON:  Surgeons and Role:    Christia Reading, MD - Primary  PHYSICIAN ASSISTANT: Clovis Cao  ASSISTANTS: none   ANESTHESIA:   general  EBL:  15 mL   BLOOD ADMINISTERED:none  DRAINS: none   LOCAL MEDICATIONS USED:  NONE  SPECIMEN:  No Specimen  DISPOSITION OF SPECIMEN:  N/A  COUNTS:  YES  TOURNIQUET:  * No tourniquets in log *  DICTATION: .Note written in EPIC  PLAN OF CARE: Discharge to home after PACU  PATIENT DISPOSITION:  PACU - hemodynamically stable.   Delay start of Pharmacological VTE agent (>24hrs) due to surgical blood loss or risk of bleeding: no

## 2023-03-09 NOTE — H&P (Signed)
[name] is an 68 y.o. female.   Chief Complaint: Sleep apnea HPI: 68 year old female with sleep apnea who has been unable to tolerate CPAP.  Past Medical History:  Diagnosis Date   Allergic rhinitis    Anxiety    Benign essential tremor    hands   Bipolar 1 disorder, mixed, moderate (HCC)    Claustrophobia    Depression    Eczema    Frequency of urination    GERD (gastroesophageal reflux disease)    History of frequent urinary tract infections    History of gastroesophageal reflux (GERD)    05-25-2017  per pt no issues since hiatal hernia repair 01/ 2018   History of hiatal hernia    History of melanoma excision    early 2000s-- BACK   History of panic attacks    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   Hypothyroidism    Intermittent palpitations    cardiology--  dr [place]   Interstitial cystitis    Limited jaw range of motion    s/p  bilateral TMJ surgery,  age 5s   Migraines    Moderate persistent asthma    pulmologist-- dr Craige Cotta   OA (osteoarthritis)    both knees   OSA on    per last sleep study 09/ 2017  mild OSA, AHI13/hr   PONV (postoperative nausea and vomiting)    PVC (premature ventricular contraction)    Rosacea    Sensation of pressure in bladder area    per pt intermittant   TMJ arthralgia    Urticaria    Wears glasses    White coat syndrome without hypertension    05-25-2017  per pt hx hypertension yrs ago, no issues after quitting stressful job    Past Surgical History:  Procedure Laterality Date   24 HOUR PH STUDY N/A 09/22/2015   Procedure: 24 HOUR PH STUDY;  Surgeon: Sherrilyn Rist, MD;  Location: [facility];  Service: Gastroenterology;  Laterality: N/A;   ADENOIDECTOMY     ANAL FISSURE REPAIR  age 24  (77)   BREAST EXCISIONAL BIOPSY Right 04-16-2003  dr p. young  [facility]   benign   BUNIONECTOMY Right early 2000s   CARPAL TUNNEL RELEASE Left age 67 (66)   CHOLECYSTECTOMY OPEN  age 41   CYSTO WITH  HYDRODISTENSION N/A 06/02/2017   Procedure: CYSTOSCOPY/HYDRODISTENSION OF BLADDER;  Surgeon: Bjorn Pippin, MD;  Location: [facility];  Service: Urology;  Laterality: N/A;   CYSTO/ HYDRODISTENTION/  INSTILLATION THERAPY  1990s   DX LAPAROSCOPY/  DX HYSTEROSCOPY/  D & C  08-07-2007   dr dove  [facility]   ENDOMETRIAL ABLATION W/ NOVASURE  10-21-2008   dr Macon Large  [facility]   ESOPHAGEAL MANOMETRY N/A 09/22/2015   Procedure: ESOPHAGEAL MANOMETRY (EM);  Surgeon: Sherrilyn Rist, MD;  [facility]: [facility];  Service: Gastroenterology;  Laterality: N/A;  with impedence    FINGER ARTHROPLASTY Bilateral    left little finger 2016:  right middle finger 06/ 2018   KNEE ARTHROSCOPY Bilateral x3 left ;  x3  right-- last one age 37 (65)   NASAL SEPTUM SURGERY  age 6s   ROBOT ASSISTED REDUCTION PARAESOPHAGEAL HIATAL HERNIA/ TYPE 2 MEDIASTINAL DISSECTION/ PRIMARY HIATAL HERNIA REPAIR/ ANTERIOR & POSTERIOR GASTROPEXY/ NISSEN FUNDOPLICATION  03-04-2017   DR GROSS  [facility]   SINOSCOPY     TEMPOROMANDIBULAR JOINT SURGERY Bilateral x3  -- age 16s  per pt used graft   TONSILLECTOMY     TOTAL KNEE ARTHROPLASTY Left 12/26/2017   Procedure: LEFT TOTAL KNEE ARTHROPLASTY;  Surgeon: Ollen Gross, MD;  Location: [facility];  Service: Orthopedics;  Laterality: Left;    TRANSTHORACIC ECHOCARDIOGRAM  12-06-2017   dr [place]   ef 55-60%, grade 1 diastoilc dysfunction, trivial MR   TRIGGER FINGER RELEASE Bilateral last one 2017   several release's bilaterally   ULNAR NERVE TRANSPOSITION Bilateral age 23 (36)    Family History  Problem Relation Age of Onset   Hypertension Mother        deceased from MVA complications   Thyroid disease Mother    Allergic rhinitis Mother    Testicular cancer Father    Allergic rhinitis Father    Colon cancer Sister    Colon polyps Sister    Healthy Sister    Healthy Brother    Coronary artery disease Other    Stomach cancer Neg Hx    Social History:  reports that she has  never smoked. She has never been exposed to tobacco smoke. She has never used smokeless tobacco. She reports current alcohol use. She reports that she does not use drugs.  Allergies:  Allergies  Allergen Reactions   Emetrol Itching   Indomethacin Other (See Comments)    Muscle spasms Causes muscle spasms in neck   Iodinated Contrast Media Itching    Flushed and Fever, itch all over   Amlodipine Swelling    Peripheral edema   Carbamazepine Other (See Comments)    Easy and unusual bleeding    Pseudoephedrine Other (See Comments)    I fly off the walls  CANNOT SLEEP   Risperidone And Related Other (See Comments)    Stomach upset, insomnia, drooling, tremors, "jerks," sensitivity to touch.    Bupivacaine Hives   Pentazocine Other (See Comments)    Unknown reaction MADE ME "LACTATE"   Propoxyphene Itching and Nausea And Vomiting    darvocet   Sulfa Antibiotics Other (See Comments)    Unknown reaction   Tramadol Other (See Comments)    Patient can not remember   Aspirin Other (See Comments)    upset stomach, ringing in the ears   Codeine Itching and Other (See Comments)    Anything related to codeine   Etodolac Rash and Other (See Comments)   Propranolol Nausea Only and Other (See Comments)    Medications Prior to Admission  Medication Sig Dispense Refill   albuterol (VENTOLIN HFA) 108 (90 Base) MCG/ACT inhaler INHALE 1-2 PUFFS INTO THE LUNGS DAILY AS NEEDED FOR WHEEZING OR SHORTNESS OF BREATH. 18 each 1   azelastine (ASTELIN) 0.1 % nasal spray Place 2 sprays into both nostrils 2 (two) times daily. Use in each nostril as directed 30 mL 5   budesonide-formoterol (SYMBICORT) 160-4.5 MCG/ACT inhaler Inhale 2 puffs into the lungs in the morning and at bedtime. 3 each 5   Carbinoxamine Maleate 4 MG TABS TAKE 1 TABLET BY MOUTH EVERY 6 HOURS 360 tablet 1   cariprazine (VRAYLAR) 3 MG capsule 4.5 mg.     clonazePAM (KLONOPIN) 1 MG tablet Take 1 mg by mouth daily as needed.      dicyclomine (BENTYL) 10 MG capsule TAKE 1 CAPSULE (10 MG TOTAL) BY MOUTH 3 (THREE) TIMES DAILY BEFORE MEALS. 270 capsule 1   DULoxetine (CYMBALTA) 60 MG capsule Take 60 mg by mouth 2 (two) times daily at 10 AM and 5 PM.     empagliflozin (  JARDIANCE) 10 MG TABS tablet Take 1 tablet (10 mg total) by mouth daily before breakfast. 90 tablet 1   fluticasone (FLONASE) 50 MCG/ACT nasal spray PLACE 1 SPRAY INTO BOTH NOSTRILS 2 (TWO) TIMES DAILY AS NEEDED FOR ALLERGIES OR RHINITIS. 48 mL 1   fluticasone (FLOVENT HFA) 220 MCG/ACT inhaler Inhale into the lungs 2 (two) times daily.     levothyroxine (SYNTHROID) 25 MCG tablet TAKE 1 TABLET BY MOUTH EVERY DAY 90 tablet 0   metoprolol tartrate (LOPRESSOR) 25 MG tablet TAKE 1 TABLET BY MOUTH TWICE A DAY 180 tablet 0   montelukast (SINGULAIR) 10 MG tablet TAKE 1 TABLET BY MOUTH EVERYDAY AT BEDTIME 90 tablet 1   MYRBETRIQ 50 MG TB24 tablet Take 50 mg by mouth daily.     NURTEC 75 MG TBDP TAKE 1 TABLET BY MOUTH EVERY OTHER DAY 40 tablet 2   rosuvastatin (CRESTOR) 20 MG tablet TAKE 1 TABLET BY MOUTH EVERY DAY 90 tablet 0   spironolactone (ALDACTONE) 25 MG tablet TAKE 1 TABLET (25 MG TOTAL) BY MOUTH DAILY. 90 tablet 0   tiZANidine (ZANAFLEX) 2 MG tablet TAKE 1 TABLET (2MG  TOTAL) BY MOUTH TWICE A DAY AS NEEDED FOR MUSCLE SPASM 180 tablet 1   trihexyphenidyl (ARTANE) 2 MG tablet TAKE 1 TABLET BY MOUTH EVERY DAY 90 tablet 0   zinc gluconate 50 MG tablet Take 50 mg by mouth daily.     fluocinonide-emollient (LIDEX-E) 0.05 % cream Apply 1 Application topically 2 (two) times daily. 60 g 0   hydrocortisone 2.5 % ointment Apply topically 2 (two) times daily.     hydrOXYzine (ATARAX) 10 MG tablet Take 1 tablet (10 mg total) by mouth every 8 (eight) hours as needed. 30 tablet 0    Results for orders placed or performed during the hospital encounter of 03/09/23 (from the past 48 hours)  Basic metabolic panel per protocol     Status: Abnormal   Collection Time: 03/08/23 12:57  PM  Result Value Ref Range   Sodium 139 135 - 145 mmol/L   Potassium 3.9 3.5 - 5.1 mmol/L   Chloride 104 98 - 111 mmol/L   CO2 27 22 - 32 mmol/L   Glucose, Bld 123 (H) 70 - 99 mg/dL    Comment: Glucose reference range applies only to samples taken after fasting for at least 8 hours.   BUN 13 8 - 23 mg/dL   Creatinine, Ser 6.04 (H) 0.44 - 1.00 mg/dL   Calcium 9.7 8.9 - 54.0 mg/dL   GFR, Estimated >98 >11 mL/min    Comment: (NOTE) Calculated using the CKD-EPI Creatinine Equation (2021)    Anion gap 8 5 - 15    Comment: Performed at [facility], [address], [place], [place] [address]  Glucose, capillary     Status: Abnormal   Collection Time: 03/09/23  6:51 AM  Result Value Ref Range   Glucose-Capillary 108 (H) 70 - 99 mg/dL    Comment: Glucose reference range applies only to samples taken after fasting for at least 8 hours.   No results found.  Review of Systems  All other systems reviewed and are negative.   Blood pressure 139/88, pulse (!) 112, temperature (!) 97.3 F (36.3 C), resp. rate 18, height 4\' 11"  (1.499 m), weight 70.9 kg, last menstrual period 03/05/2012, SpO2 97%. Physical Exam Constitutional:      Appearance: Normal appearance.  HENT:     Head: Normocephalic and atraumatic.     Right  Ear: External ear normal.     Left Ear: External ear normal.     Nose: Nose normal.     Mouth/Throat:     Mouth: Mucous membranes are moist.     Pharynx: Oropharynx is clear.  Eyes:     Extraocular Movements: Extraocular movements intact.     Conjunctiva/sclera: Conjunctivae normal.     Pupils: Pupils are equal, round, and reactive to light.  Cardiovascular:     Rate and Rhythm: Normal rate.  Pulmonary:     Effort: Pulmonary effort is normal.  Musculoskeletal:     Cervical back: Normal range of motion.  Skin:    General: Skin is warm and dry.  Neurological:     General: No focal deficit present.     Mental Status: She is alert and oriented to person, place,  and time.  Psychiatric:        Mood and Affect: Mood normal.        Behavior: Behavior normal.        Thought Content: Thought content normal.        Judgment: Judgment normal.      Assessment/Plan Obstructive sleep apnea and BMI 31.57  To OR for hypoglossal nerve stimulator placement.  Christia Reading, MD 03/09/2023, 7:29 AM

## 2023-03-09 NOTE — Progress Notes (Signed)
Dr. Jenne Pane called per Herby Abraham RN for xray results

## 2023-03-09 NOTE — Anesthesia Procedure Notes (Signed)
Procedure Name: Intubation Date/Time: 03/09/2023 7:51 AM  Performed by: Demetrio Lapping, CRNAPre-anesthesia Checklist: Patient identified, Emergency Drugs available, Suction available and Patient being monitored Patient Re-evaluated:Patient Re-evaluated prior to induction Oxygen Delivery Method: Circle System Utilized Preoxygenation: Pre-oxygenation with 100% oxygen Induction Type: IV induction Ventilation: Mask ventilation without difficulty and Oral airway inserted - appropriate to patient size Laryngoscope Size: Glidescope and 3 Grade View: Grade I Tube type: Oral Tube size: 7.5 mm Number of attempts: 1 Airway Equipment and Method: Stylet Placement Confirmation: ETT inserted through vocal cords under direct vision, positive ETCO2 and breath sounds checked- equal and bilateral Secured at: 22 cm Tube secured with: Tape Dental Injury: Teeth and Oropharynx as per pre-operative assessment  Difficulty Due To: Difficulty was anticipated, Difficult Airway- due to anterior larynx and Difficult Airway- due to limited oral opening Future Recommendations: Recommend- induction with short-acting agent, and alternative techniques readily available

## 2023-03-09 NOTE — Discharge Instructions (Signed)
No tylenol until 1pm   Post Anesthesia Home Care Instructions  Activity: Get plenty of rest for the remainder of the day. A responsible individual must stay with you for 24 hours following the procedure.  For the next 24 hours, DO NOT: -Drive a car -Advertising copywriter -Drink alcoholic beverages -Take any medication unless instructed by your physician -Make any legal decisions or sign important papers.  Meals: Start with liquid foods such as gelatin or soup. Progress to regular foods as tolerated. Avoid greasy, spicy, heavy foods. If nausea and/or vomiting occur, drink only clear liquids until the nausea and/or vomiting subsides. Call your physician if vomiting continues.  Special Instructions/Symptoms: Your throat may feel dry or sore from the anesthesia or the breathing tube placed in your throat during surgery. If this causes discomfort, gargle with warm salt water. The discomfort should disappear within 24 hours.  If you had a scopolamine patch placed behind your ear for the management of post- operative nausea and/or vomiting:  1. The medication in the patch is effective for 72 hours, after which it should be removed.  Wrap patch in a tissue and discard in the trash. Wash hands thoroughly with soap and water. 2. You may remove the patch earlier than 72 hours if you experience unpleasant side effects which may include dry mouth, dizziness or visual disturbances. 3. Avoid touching the patch. Wash your hands with soap and water after contact with the patch.

## 2023-03-10 ENCOUNTER — Encounter (HOSPITAL_BASED_OUTPATIENT_CLINIC_OR_DEPARTMENT_OTHER): Payer: Self-pay | Admitting: Otolaryngology

## 2023-03-16 DIAGNOSIS — F3132 Bipolar disorder, current episode depressed, moderate: Secondary | ICD-10-CM | POA: Diagnosis not present

## 2023-03-17 DIAGNOSIS — G4733 Obstructive sleep apnea (adult) (pediatric): Secondary | ICD-10-CM | POA: Diagnosis not present

## 2023-03-18 ENCOUNTER — Ambulatory Visit: Payer: Medicare HMO | Admitting: Orthopedic Surgery

## 2023-03-18 ENCOUNTER — Other Ambulatory Visit: Payer: Self-pay

## 2023-03-18 ENCOUNTER — Encounter: Payer: Self-pay | Admitting: Orthopedic Surgery

## 2023-03-18 DIAGNOSIS — M19011 Primary osteoarthritis, right shoulder: Secondary | ICD-10-CM

## 2023-03-18 DIAGNOSIS — Z9889 Other specified postprocedural states: Secondary | ICD-10-CM | POA: Diagnosis not present

## 2023-03-18 MED ORDER — TIZANIDINE HCL 2 MG PO TABS: 2.0000 mg | ORAL_TABLET | Freq: Two times a day (BID) | ORAL | 1 refills | Status: DC | PRN

## 2023-03-18 MED ORDER — METHYLPREDNISOLONE ACETATE 40 MG/ML IJ SUSP
40.0000 mg | INTRAMUSCULAR | Status: AC | PRN
  Administered 2023-03-18: 40 mg via INTRA_ARTICULAR

## 2023-03-18 NOTE — Progress Notes (Signed)
 Office Visit Note   Patient: [name]           Date of Birth: [birth year]           MRN: [id number] Visit Date: 03/18/2023 Requested by: Joshua Debby CROME, MD [address] [place],  [place] [address] PCP: Joshua Debby CROME, MD  Subjective: Chief Complaint  Patient presents with   Left Shoulder - Pain   Right Shoulder - Pain    HPI: [name] is a 68 y.o. female who presents to the office reporting significant right shoulder pain.  She also describes left shoulder pain.  MRIs of both shoulders have been performed.  The MRI scan of the left shoulder demonstrates high-grade partial-thickness tearing of the supraspinatus tendon with mild to moderate arthritis of the left glenohumeral joint and some bursitis.  MRI of the right shoulder demonstrates severe tendinosis of the biceps along with severe arthritis of the glenohumeral joint and intact rotator cuff.  The scans are reviewed with the patient.  Patient states that she is having significant pain in both shoulders.  She had to have a neighbor come and help her take off her pullover dress after church 1 weekend.  Does interfere with her sleep at night.  She recently had inspire sleep apnea surgery on that right-hand side.  That has not been turned on yet.  Recently started medications for diabetes as well.  Hurts for her to raise her arm.  She has had prior subacromial injections which only helped her for a few weeks.  She does take iodine for symptoms.  Cannot take over-the-counter medication..                ROS: All systems reviewed are negative as they relate to the chief complaint within the history of present illness.  Patient denies fevers or chills.  Assessment & Plan: Visit Diagnoses:  1. Status post arthroscopy of right shoulder   2. Arthritis of right shoulder region     Plan: Impression is end-stage right shoulder arthritis with the left shoulder about 1 year behind in terms of progressive pathology.  She has not  had an intra-articular glenohumeral joint injection yet with ultrasound so we did that today.  Plan to see her back in about 4 months at which time we will consider either repeat injection or proceeding with shoulder replacement.  I think she will have to have that shoulder replaced at sometime in the future but for now she wants to get the inspire sleep apnea pacemaker functioning prior to surgical intervention  Important to note she does describe an Marcaine allergy and we did not use that with the injection today  Follow-Up Instructions: No follow-ups on file.   Orders:  Orders Placed This Encounter  Procedures   US  Guided Needle Placement - No Linked Charges   No orders of the defined types were placed in this encounter.     Procedures: Large Joint Inj: R glenohumeral on 03/18/2023 5:16 PM Indications: diagnostic evaluation and pain Details: 22 G 3.5 in needle, ultrasound-guided posterior approach  Arthrogram: No  Medications: 40 mg methylPREDNISolone  acetate 40 MG/ML Outcome: tolerated well, no immediate complications Procedure, treatment alternatives, risks and benefits explained, specific risks discussed. Consent was given by the patient. Immediately prior to procedure a time out was called to verify the correct patient, procedure, equipment, support staff and site/side marked as required. Patient was prepped and draped in the usual sterile fashion.  Clinical Data: No additional findings.  Objective: Vital Signs: LMP 03/05/2012 (Approximate)   Physical Exam:  Constitutional: Patient appears well-developed HEENT:  Head: Normocephalic Eyes:EOM are normal Neck: Normal range of motion Cardiovascular: Normal rate Pulmonary/chest: Effort normal Neurologic: Patient is alert Skin: Skin is warm Psychiatric: Patient has normal mood and affect  Ortho Exam: Ortho exam demonstrates good cervical spine range of motion.  5 out of 5 grip EPL FPL interosseous wrist flexion  extension bicep triceps and deltoid strength.  She has pretty reasonable external rotation passively in both shoulders to about 50 to 55 degrees.  Rotator cuff strength is about 5- out of 5 external rotation on the right and left-hand side but subscap strength is 5+ out of 5 bilaterally.  No Popeye deformity is present.  No discrete AC joint tenderness is present.  As on either side.  She does have some coarseness consistent with end-stage arthritis on the right but that is not present with passive range of motion on the left.  Specialty Comments:  No specialty comments available.  Imaging: No results found.   PMFS History: Patient Active Problem List   Diagnosis Date Noted   Allergic contact dermatitis due to drugs in contact with skin 11/23/2022   Type 2 diabetes mellitus with stage 3b chronic kidney disease, without long-term current use of insulin  (HCC) 10/26/2022   Idiopathic hypotension 10/25/2022   Type 2 diabetes mellitus with stage 3a chronic kidney disease, without long-term current use of insulin  (HCC) 07/09/2022   Hyperlipidemia LDL goal <100 08/24/2021   Encounter for general adult medical examination with abnormal findings 03/14/2021   Estrogen deficiency 03/09/2021   Cervical cancer screening 03/09/2021   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   Grade I diastolic dysfunction 03/12/2020   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   Osteoarthritis of glenohumeral joint 03/26/2019   Iron deficiency anemia 04/28/2018   OA (osteoarthritis) of knee 12/26/2017   PVC (premature ventricular contraction) 11/21/2017   Vitamin D  deficiency, unspecified 10/13/2016   Primary osteoarthritis of left hand 06/30/2016   Osteoporosis without current pathological fracture 03/22/2016   Paraesophageal hiatal hernia s/p robotic repair & fundoplication 03/04/2016 03/04/2016   Bipolar 1 disorder, mixed, moderate (HCC) 01/29/2016   Class 1 obesity with body mass index (BMI) of 32.0 to  32.9 in adult 01/06/2016   Migraines 11/21/2014   OSA on CPAP 01/30/2007   GERD 11/02/2006   Hypothyroidism 10/07/2006   Bipolar affective (HCC) 10/07/2006   Essential hypertension 10/07/2006   RHINITIS, CHRONIC 10/07/2006   Seasonal and perennial allergic rhinitis 10/07/2006   Moderate persistent asthma, uncomplicated 10/07/2006   Irritable bowel syndrome 10/07/2006   INTERSTITIAL CYSTITIS 10/07/2006   ROSACEA 10/07/2006   Osteoarthritis of both knees 10/07/2006   Past Medical History:  Diagnosis Date   Allergic rhinitis    Anxiety    Benign essential tremor    hands   Bipolar 1 disorder, mixed, moderate (HCC)    Claustrophobia    Depression    Eczema    Frequency of urination    GERD (gastroesophageal reflux disease)    History of frequent urinary tract infections    History of gastroesophageal reflux (GERD)    05-25-2017  per pt no issues since hiatal hernia repair 01/ 2018   History of hiatal hernia    History of melanoma excision    early 2000s-- BACK   History of panic attacks    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   Hypothyroidism  Intermittent palpitations    cardiology--  dr jordan   Interstitial cystitis    Limited jaw range of motion    s/p  bilateral TMJ surgery,  age 53s   Migraines    Moderate persistent asthma    pulmologist-- dr shellia   OA (osteoarthritis)    both knees   OSA on    per last sleep study 09/ 2017  mild OSA, AHI13/hr   PONV (postoperative nausea and vomiting)    PVC (premature ventricular contraction)    Rosacea    Sensation of pressure in bladder area    per pt intermittant   TMJ arthralgia    Urticaria    Wears glasses    White coat syndrome without hypertension    05-25-2017  per pt hx hypertension yrs ago, no issues after quitting stressful job    Family History  Problem Relation Age of Onset   Hypertension Mother        deceased from MVA complications   Thyroid  disease Mother    Allergic  rhinitis Mother    Testicular cancer Father    Allergic rhinitis Father    Colon cancer Sister    Colon polyps Sister    Healthy Sister    Healthy Brother    Coronary artery disease Other    Stomach cancer Neg Hx     Past Surgical History:  Procedure Laterality Date   80 HOUR PH STUDY N/A 09/22/2015   Procedure: 24 HOUR PH STUDY;  Surgeon: Victory LITTIE Legrand DOUGLAS, MD;  Location: [facility];  Service: Gastroenterology;  Laterality: N/A;   ADENOIDECTOMY     ANAL FISSURE REPAIR  age 50  (24)   BREAST EXCISIONAL BIOPSY Right 04-16-2003  dr p. young  [facility]   benign   BUNIONECTOMY Right early 2000s   CARPAL TUNNEL RELEASE Left age 67 (78)   CHOLECYSTECTOMY OPEN  age 49   CYSTO WITH HYDRODISTENSION N/A 06/02/2017   Procedure: CYSTOSCOPY/HYDRODISTENSION OF BLADDER;  Surgeon: Watt Rush, MD;  Location: [facility];  Service: Urology;  Laterality: N/A;   CYSTO/ HYDRODISTENTION/  INSTILLATION THERAPY  1990s   DX LAPAROSCOPY/  DX HYSTEROSCOPY/  D & C  08-07-2007   dr dove  [facility]   ENDOMETRIAL ABLATION W/ NOVASURE  10-21-2008   dr herchel  [facility]   ESOPHAGEAL MANOMETRY N/A 09/22/2015   Procedure: ESOPHAGEAL MANOMETRY (EM);  Surgeon: Victory LITTIE Legrand DOUGLAS, MD;  Location: [facility];  Service: Gastroenterology;  Laterality: N/A;  with impedence    FINGER ARTHROPLASTY Bilateral    left little finger 2016:  right middle finger 06/ 2018   IMPLANTATION OF HYPOGLOSSAL NERVE STIMULATOR Right 03/09/2023   Procedure: RIGHT IMPLANTATION OF HYPOGLOSSAL NERVE STIMULATOR;  Surgeon: Carlie Clark, MD;  Location: [facility];  Service: ENT;  Laterality: Right;   KNEE ARTHROSCOPY Bilateral x3 left ;  x3  right-- last one age 59 (77)   NASAL SEPTUM SURGERY  age 69s   ROBOT ASSISTED REDUCTION PARAESOPHAGEAL HIATAL HERNIA/ TYPE 2 MEDIASTINAL DISSECTION/ PRIMARY HIATAL HERNIA REPAIR/ ANTERIOR & POSTERIOR GASTROPEXY/ NISSEN FUNDOPLICATION  03-04-2017   DR GROSS  [facility]   SINOSCOPY      TEMPOROMANDIBULAR JOINT SURGERY Bilateral x3  -- age 67s   per pt used graft   TONSILLECTOMY     TOTAL KNEE ARTHROPLASTY Left 12/26/2017   Procedure: LEFT TOTAL KNEE ARTHROPLASTY;  Surgeon: Melodi Lerner, MD;  Location: [facility];  Service: Orthopedics;  Laterality: Left;   TRANSTHORACIC ECHOCARDIOGRAM  12-06-2017   dr jordan   ef 55-60%, grade 1 diastoilc dysfunction, trivial MR   TRIGGER FINGER RELEASE Bilateral last one 2017   several release's bilaterally   ULNAR NERVE TRANSPOSITION Bilateral age 7 (33)   Social History   Occupational History   Occupation: disability  Tobacco Use   Smoking status: Never    Passive exposure: Never   Smokeless tobacco: Never  Vaping Use   Vaping status: Never Used  Substance and Sexual Activity   Alcohol use: Yes    Alcohol/week: 0.0 standard drinks of alcohol    Comment: once every 3-4 months   Drug use: No   Sexual activity: Not Currently    Partners: Male    Birth control/protection: Post-menopausal    Comment: ablation

## 2023-03-19 ENCOUNTER — Other Ambulatory Visit: Payer: Self-pay | Admitting: Internal Medicine

## 2023-03-19 DIAGNOSIS — T43505A Adverse effect of unspecified antipsychotics and neuroleptics, initial encounter: Secondary | ICD-10-CM

## 2023-03-21 ENCOUNTER — Other Ambulatory Visit: Payer: Self-pay | Admitting: Allergy & Immunology

## 2023-03-21 ENCOUNTER — Other Ambulatory Visit: Payer: Self-pay | Admitting: Internal Medicine

## 2023-03-21 DIAGNOSIS — L233 Allergic contact dermatitis due to drugs in contact with skin: Secondary | ICD-10-CM

## 2023-03-21 DIAGNOSIS — E785 Hyperlipidemia, unspecified: Secondary | ICD-10-CM

## 2023-03-21 DIAGNOSIS — E039 Hypothyroidism, unspecified: Secondary | ICD-10-CM

## 2023-03-21 DIAGNOSIS — I10 Essential (primary) hypertension: Secondary | ICD-10-CM

## 2023-03-21 DIAGNOSIS — K582 Mixed irritable bowel syndrome: Secondary | ICD-10-CM

## 2023-03-22 ENCOUNTER — Telehealth: Payer: Self-pay

## 2023-03-22 ENCOUNTER — Other Ambulatory Visit (HOSPITAL_COMMUNITY): Payer: Self-pay

## 2023-03-22 NOTE — Telephone Encounter (Signed)
Pharmacy Patient Advocate Encounter   Received notification from  [facility] Portal that prior authorization for Trihexyphenidyl HCl 2MG  tablets is required/requested.   Insurance verification completed.   The patient is insured through [facility] .   Per test claim: PA required; PA submitted to above mentioned insurance via CoverMyMeds Key/confirmation #/EOC [id number] Status is pending

## 2023-03-22 NOTE — Telephone Encounter (Signed)
Copied from CRM [id number]. Topic: Clinical - Medication Question >> Mar 22, 2023 10:50 AM Dimitri Ped wrote: Reason for CRM: [employer] calling for prior authorization for trihexyphenidyl has additional questions about prior authorization. Please reach out  [phone] . The case number [id number]. We will also be faxing over the additional information needed

## 2023-03-24 ENCOUNTER — Ambulatory Visit: Payer: Medicare HMO | Admitting: Internal Medicine

## 2023-03-24 ENCOUNTER — Telehealth (HOSPITAL_COMMUNITY): Payer: Self-pay

## 2023-03-24 ENCOUNTER — Encounter: Payer: Self-pay | Admitting: Internal Medicine

## 2023-03-24 VITALS — BP 132/92 | HR 92 | Temp 98.2°F | Ht 59.0 in | Wt 152.5 lb

## 2023-03-24 DIAGNOSIS — E039 Hypothyroidism, unspecified: Secondary | ICD-10-CM | POA: Diagnosis not present

## 2023-03-24 DIAGNOSIS — E785 Hyperlipidemia, unspecified: Secondary | ICD-10-CM | POA: Diagnosis not present

## 2023-03-24 DIAGNOSIS — E1122 Type 2 diabetes mellitus with diabetic chronic kidney disease: Secondary | ICD-10-CM

## 2023-03-24 DIAGNOSIS — N1831 Chronic kidney disease, stage 3a: Secondary | ICD-10-CM

## 2023-03-24 DIAGNOSIS — D539 Nutritional anemia, unspecified: Secondary | ICD-10-CM | POA: Insufficient documentation

## 2023-03-24 DIAGNOSIS — I10 Essential (primary) hypertension: Secondary | ICD-10-CM

## 2023-03-24 DIAGNOSIS — M810 Age-related osteoporosis without current pathological fracture: Secondary | ICD-10-CM

## 2023-03-24 DIAGNOSIS — I493 Ventricular premature depolarization: Secondary | ICD-10-CM | POA: Diagnosis not present

## 2023-03-24 DIAGNOSIS — E559 Vitamin D deficiency, unspecified: Secondary | ICD-10-CM | POA: Diagnosis not present

## 2023-03-24 DIAGNOSIS — D511 Vitamin B12 deficiency anemia due to selective vitamin B12 malabsorption with proteinuria: Secondary | ICD-10-CM

## 2023-03-24 DIAGNOSIS — R000 Tachycardia, unspecified: Secondary | ICD-10-CM | POA: Insufficient documentation

## 2023-03-24 DIAGNOSIS — Z7984 Long term (current) use of oral hypoglycemic drugs: Secondary | ICD-10-CM

## 2023-03-24 MED ORDER — METOPROLOL SUCCINATE ER 25 MG PO TB24: 25.0000 mg | ORAL_TABLET | Freq: Every day | ORAL | 0 refills | Status: DC

## 2023-03-24 NOTE — Progress Notes (Signed)
Subjective:  Patient ID: [name], female    DOB: 1955-05-15  Age: 68 y.o. MRN: [id number]  CC: Anemia and Hypertension   HPI [name] presents for f/up ---  Discussed the use of AI scribe software for clinical note transcription with the patient, who gave verbal consent to proceed.  History of Present Illness   [name] is a 68 year old female with anemia and osteoporosis who presents with fatigue and medication concerns.  She has been experiencing fatigue for several months without associated weakness or shortness of breath. She is concerned that her fatigue may be related to a recent reduction in her thyroid medication dosage. She denies symptoms typically associated with anemia, such as numbness, weakness, or tingling.  Her history of anemia remains without an identified cause. 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  She has osteoporosis and recalls a previous attempt to start bone medication, which was denied by insurance, with no follow-up. She recently received a letter from [place] questioning the absence of bone medication.  She experiences dizziness and temporary blackouts after using an inhaler that starts with an 'A'. These symptoms resolve after sitting and exhaling. No chest pain, shortness of breath, dizziness, or lightheadedness during physical activity. She reports a dry mouth and occasional nasal congestion with a cough that produces a tickle sensation.  She is learning about chair yoga to increase her physical activity.   She is not taking the BB.      Outpatient Medications Prior to Visit  Medication Sig Dispense Refill   albuterol (VENTOLIN HFA) 108 (90 Base) MCG/ACT inhaler INHALE 1-2 PUFFS INTO THE LUNGS DAILY AS NEEDED FOR WHEEZING OR SHORTNESS OF BREATH. 18 each 1   Azelastine  HCl 137 MCG/SPRAY SOLN PLACE 2 SPRAYS INTO BOTH NOSTRILS 2 (TWO) TIMES DAILY. USE IN EACH NOSTRIL AS DIRECTED 90 mL 0   Carbinoxamine Maleate 4 MG TABS TAKE 1 TABLET BY MOUTH EVERY 6 HOURS 360 tablet 1   cariprazine (VRAYLAR) 3 MG capsule 4.5 mg.     clonazePAM (KLONOPIN) 1 MG tablet Take 1 mg by mouth daily as needed.     dicyclomine (BENTYL) 10 MG capsule TAKE 1 CAPSULE (10 MG TOTAL) BY MOUTH 3 (THREE) TIMES DAILY BEFORE MEALS. 270 capsule 1   DULoxetine (CYMBALTA) 60 MG capsule Take 60 mg by mouth 2 (two) times daily at 10 AM and 5 PM.     empagliflozin (JARDIANCE) 10 MG TABS tablet Take 1 tablet (10 mg total) by mouth daily before breakfast. 90 tablet 1   fluocinonide-emollient (LIDEX-E) 0.05 % cream APPLY TO AFFECTED AREA TWICE A DAY 60 g 0   fluticasone (FLONASE) 50 MCG/ACT nasal spray PLACE 1 SPRAY INTO BOTH NOSTRILS 2 (TWO) TIMES DAILY AS NEEDED FOR ALLERGIES OR RHINITIS. 48 mL 1   fluticasone (FLOVENT HFA) 220 MCG/ACT inhaler Inhale into the lungs 2 (two) times daily.     hydrOXYzine (ATARAX) 10 MG tablet Take 1 tablet (10 mg total) by mouth every 8 (eight) hours as needed. 30 tablet 0   levothyroxine (SYNTHROID) 25 MCG tablet TAKE 1 TABLET BY MOUTH EVERY DAY 90 tablet 0   montelukast (SINGULAIR) 10 MG tablet TAKE 1 TABLET BY MOUTH EVERYDAY AT BEDTIME 90 tablet 1   MYRBETRIQ 50 MG TB24 tablet Take 50 mg by mouth daily.     NURTEC 75 MG  TBDP TAKE 1 TABLET BY MOUTH EVERY OTHER DAY 40 tablet 2   rosuvastatin (CRESTOR) 20 MG tablet TAKE 1 TABLET BY MOUTH EVERY DAY 90 tablet 0   spironolactone (ALDACTONE) 25 MG tablet TAKE 1 TABLET (25 MG TOTAL) BY MOUTH DAILY. 90 tablet 0   tiZANidine (ZANAFLEX) 2 MG tablet Take 1 tablet (2 mg total) by mouth 2 (two) times daily as needed for muscle spasms. 60 tablet 1   trihexyphenidyl (ARTANE) 2 MG tablet TAKE 1 TABLET BY MOUTH EVERY DAY 90 tablet 0   zinc gluconate 50 MG tablet Take 50 mg by mouth daily.     metoprolol tartrate (LOPRESSOR) 25 MG tablet  TAKE 1 TABLET BY MOUTH TWICE A DAY 180 tablet 0   budesonide-formoterol (SYMBICORT) 160-4.5 MCG/ACT inhaler Inhale 2 puffs into the lungs in the morning and at bedtime. 3 each 5   HYDROcodone-acetaminophen (NORCO/VICODIN) 5-325 MG tablet Take 1-2 tablets by mouth every 6 (six) hours as needed for moderate pain (pain score 4-6). 12 tablet 0   hydrocortisone 2.5 % ointment Apply topically 2 (two) times daily.     No facility-administered medications prior to visit.    ROS Review of Systems  Constitutional:  Positive for fatigue. Negative for appetite change, diaphoresis, fever and unexpected weight change.  HENT:  Positive for postnasal drip and rhinorrhea. Negative for sinus pressure, sinus pain, sore throat and trouble swallowing.   Eyes: Negative.   Respiratory: Negative.  Negative for cough, chest tightness, shortness of breath and wheezing.   Cardiovascular:  Negative for chest pain, palpitations and leg swelling.  Gastrointestinal:  Negative for abdominal pain, constipation, diarrhea, nausea and vomiting.  Genitourinary: Negative.  Negative for difficulty urinating and dysuria.  Musculoskeletal: Negative.  Negative for arthralgias and myalgias.  Skin: Negative.   Neurological:  Positive for tremors. Negative for weakness, light-headedness and headaches.  Hematological:  Negative for adenopathy. Does not bruise/bleed easily.  Psychiatric/Behavioral: Negative.      Objective:  BP (!) 132/92   Pulse 92   Temp 98.2 F (36.8 C) (Temporal)   Ht 4\' 11"  (1.499 m)   Wt 152 lb 8 oz (69.2 kg)   LMP 03/05/2012 (Approximate)   SpO2 94%   BMI 30.80 kg/m   BP Readings from Last 3 Encounters:  03/24/23 (!) 132/92  03/09/23 (!) 146/80  01/03/23 124/80    Wt Readings from Last 3 Encounters:  03/24/23 152 lb 8 oz (69.2 kg)  03/09/23 156 lb 4.9 oz (70.9 kg)  01/03/23 162 lb (73.5 kg)    Physical Exam Vitals reviewed.  Constitutional:      Appearance: Normal appearance.  HENT:      Mouth/Throat:     Mouth: Mucous membranes are moist.  Eyes:     General: No scleral icterus.    Conjunctiva/sclera: Conjunctivae normal.  Cardiovascular:     Rate and Rhythm: Tachycardia present. Occasional Extrasystoles are present.    Heart sounds: No murmur heard.    No friction rub. No gallop.     Comments: EKG- ST with occasional PVC, 110 bpm No LVH, Q waves, or ST/T wave changes  Pulmonary:     Effort: Pulmonary effort is normal.     Breath sounds: No stridor. No wheezing, rhonchi or rales.  Abdominal:     General: Abdomen is flat.     Palpations: There is no mass.     Tenderness: There is no abdominal tenderness. There is no guarding.     Hernia: No hernia is  present.  Musculoskeletal:     Cervical back: Neck supple.     Right lower leg: No edema.     Left lower leg: No edema.  Skin:    General: Skin is warm and dry.  Neurological:     General: No focal deficit present.     Mental Status: She is alert. Mental status is at baseline.  Psychiatric:        Mood and Affect: Mood normal.        Behavior: Behavior normal.     Lab Results  Component Value Date   WBC 13.4 (H) 03/24/2023   HGB 14.1 03/24/2023   HCT 43.6 03/24/2023   PLT 263.0 03/24/2023   GLUCOSE 127 (H) 03/24/2023   CHOL 159 03/24/2023   TRIG 95.0 03/24/2023   HDL 73.60 03/24/2023   LDLDIRECT 143.0 09/14/2016   LDLCALC 66 03/24/2023   ALT 17 03/24/2023   AST 19 03/24/2023   NA 139 03/24/2023   K 4.0 03/24/2023   CL 101 03/24/2023   CREATININE 1.11 03/24/2023   BUN 19 03/24/2023   CO2 26 03/24/2023   TSH 0.36 03/24/2023   INR 0.94 12/21/2017   HGBA1C 7.2 (H) 03/24/2023   MICROALBUR <0.7 10/25/2022    DG Neck Soft Tissue Result Date: 03/09/2023 CLINICAL DATA:  Obstructive sleep apnea EXAM: NECK SOFT TISSUES - 1+ VIEW COMPARISON:  None Available. FINDINGS: Suboptimal study due to overlying shoulders. Lower cervical airway not visualized. No evidence of epiglottic thickening or retropharyngeal  soft tissue abnormality. No radiopaque foreign body. IMPRESSION: No visible acute abnormality. Electronically Signed   By: Charlett Nose M.D.   On: 03/09/2023 10:39   DG Chest 1 View Result Date: 03/09/2023 CLINICAL DATA:  Obstructive sleep apnea. EXAM: CHEST  1 VIEW COMPARISON:  Chest radiograph dated 01/16/2018. FINDINGS: No focal consolidation, pleural effusion, or pneumothorax. Left lung base atelectasis/scarring. The cardiac silhouette is within normal limits. Similar device over the right chest. No acute osseous pathology. IMPRESSION: No active disease. Electronically Signed   By: Elgie Collard M.D.   On: 03/09/2023 10:39    Assessment & Plan:  Acquired hypothyroidism- She is euthyroid. -     Lipid panel; Future -     TSH; Future -     CBC with Differential/Platelet; Future  Type 2 diabetes mellitus with stage 3a chronic kidney disease, without long-term current use of insulin (HCC)- Her blood sugar is well controlled. -     Urinalysis, Routine w reflex microscopic; Future -     Hemoglobin A1c; Future -     Basic metabolic panel; Future  Hyperlipidemia LDL goal <100 - LDL goal achieved. Doing well on the statin  -     Lipid panel; Future -     TSH; Future -     Hepatic function panel; Future  Deficiency anemia -     IBC + Ferritin; Future -     Reticulocytes; Future -     Folate; Future -     Vitamin B1; Future -     Zinc; Future -     Vitamin B12; Future  Vitamin D deficiency, unspecified -     Phosphorus; Future -     VITAMIN D 25 Hydroxy (Vit-D Deficiency, Fractures); Future -     Cholecalciferol; Take 1 tablet (2,000 Units total) by mouth daily.  Dispense: 90 tablet; Refill: 1  Osteoporosis without current pathological fracture, unspecified osteoporosis type -     Phosphorus; Future -  VITAMIN D 25 Hydroxy (Vit-D Deficiency, Fractures); Future -     Cholecalciferol; Take 1 tablet (2,000 Units total) by mouth daily.  Dispense: 90 tablet; Refill: 1 -     Denosumab;  Inject 60 mg into the skin once for 1 dose.  Dispense: 1 mL; Refill: 0  Tachycardia -     Metoprolol Succinate ER; Take 1 tablet (25 mg total) by mouth daily.  Dispense: 90 tablet; Refill: 0 -     AMB Referral [facility] Care Management  PVC (premature ventricular contraction) -     Metoprolol Succinate ER; Take 1 tablet (25 mg total) by mouth daily.  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     Follow-up: Return in about 3 months (around 06/21/2023).  Sanda Linger, MD

## 2023-03-24 NOTE — Telephone Encounter (Signed)
Pharmacy Patient Advocate Encounter   Received notification from CoverMyMeds that prior authorization for Dicyclomine HCl 10MG  capsules is required/requested.   Insurance verification completed.   The patient is insured through [facility] .   Per test claim: PA required; PA started via CoverMyMeds. KEY [id number] . Waiting for clinical questions to populate.

## 2023-03-24 NOTE — Patient Instructions (Signed)
Premature Ventricular Contraction  A premature ventricular contraction (PVC) is a type of irregular heartbeat (arrhythmia). The heart has four chambers, including the upper chambers (atria) and lower chambers (ventricles). Normally, an electrical signal starts in a group of cells called the sinoatrial node (SA node) and travels through the atria, causing them to pump blood into the ventricles. During a PVC, the heartbeat starts in one of the lower ventricles. This may cause the heartbeat to be shorter and less effective. In most cases, PVCs come and go and do not require treatment. What are the causes? Common causes of the condition include: Heart attack or coronary artery disease (CAD). Heart valve problems. Heart surgery. Infection of the heart (myocarditis). Inflammation of the heart. In many cases, the cause of this condition is not known. What increases the risk? The following factors may make you more likely to develop this condition: Age, especially being over age 19. Being female. An imbalance of salts and minerals in the body (electrolytes). Low blood oxygen levels or high carbon dioxide levels. Certain medicines, including over-the-counter and prescribed medicines. High blood pressure. Obesity. Episodes may be triggered by: Vigorous exercise. Tobacco, alcohol, or caffeine use. Illegal drug use. Emotional stress. Poor or irregular sleep. What are the signs or symptoms? The main symptoms of this condition are fast or irregular heartbeats (palpitations) or the feeling of a pause in the heartbeat. Other symptoms include: Shortness of breath. Difficulty exercising. Chest pain. Feeling tired. Dizziness. In some cases, there are no symptoms. How is this diagnosed? This condition may be diagnosed based on: Your medical history or symptoms. A physical exam. Your health care provider may listen to your heart. Tests, such as: Blood tests. An ECG (electrocardiogram) to monitor the  electrical activity of your heart. An ambulatory cardiac monitor that records your heartbeats for 24 hours or more. Stress tests to see how exercise affects your heart rhythm and blood supply. An echocardiogram, which creates an image of your heart. An electrophysiology study (EPS) to check for electrical problems in your heart. How is this treated? Treatment for this condition depends on any underlying conditions, the type of PVC, how many PVCs you have had, and if the symptoms are affecting your daily life. Possible treatments include: Avoiding things that cause PVCs (triggers). These include caffeine, tobacco, and alcohol. Taking medicines if symptoms are severe or if the arrhythmias happen a lot. Getting treatment for underlying conditions that cause PVCs. Having an implantable cardioverter defibrillator (ICD) placed in the chest to monitor the heartbeat. The monitor sends a shock to the heart if it senses an arrhythmia and brings the heartbeat back to normal. Having a catheter ablation procedure to destroy the part of the heart tissue that sends abnormal signals. In many cases, no treatment is required. Follow these instructions at home: Lifestyle  Do not use any products that contain nicotine or tobacco. These products include cigarettes, chewing tobacco, and vaping devices, such as e-cigarettes. If you need help quitting, ask your health care provider. Do not use illegal drugs. Exercise regularly. Ask your health care provider what type of exercise is safe for you. Try to get at least 7-9 hours of sleep each night. Find healthy ways to manage stress. Avoid stressful situations when possible. Alcohol use Do not drink alcohol if: Your health care provider tells you not to drink. You are pregnant, may be pregnant, or are planning to become pregnant. Alcohol triggers your episodes. If you drink alcohol: Limit how much you have to: 0-1  drink a day for women. 0-2 drinks a day for  men. Know how much alcohol is in your drink. In the [place] one drink equals one 12 oz bottle of beer (355 mL), one 5 oz glass of wine (148 mL), or one 1 oz glass of hard liquor (44 mL). General instructions Take over-the-counter and prescription medicines only as told by your health care provider. If caffeine triggers episodes of PVC, do not eat, drink, or use anything with caffeine in it. Contact a health care provider if: You feel palpitations often. You have nausea and vomiting. Get help right away if: You have chest pain. You have trouble breathing. You start sweating for no reason. You become light-headed or you faint. These symptoms may be an emergency. Get help right away. Call 911. Do not wait to see if the symptoms will go away. Do not drive yourself to the hospital. This information is not intended to replace advice given to you by your health care provider. Make sure you discuss any questions you have with your health care provider. Document Revised: 06/26/2021 Document Reviewed: 06/26/2021 Elsevier Patient Education  2024 [employer].

## 2023-03-25 ENCOUNTER — Encounter: Payer: Self-pay | Admitting: Internal Medicine

## 2023-03-25 DIAGNOSIS — D511 Vitamin B12 deficiency anemia due to selective vitamin B12 malabsorption with proteinuria: Secondary | ICD-10-CM | POA: Insufficient documentation

## 2023-03-25 LAB — BASIC METABOLIC PANEL
BUN: 19 mg/dL (ref 6–23)
CO2: 26 meq/L (ref 19–32)
Calcium: 9.6 mg/dL (ref 8.4–10.5)
Chloride: 101 meq/L (ref 96–112)
Creatinine, Ser: 1.11 mg/dL (ref 0.40–1.20)
GFR: 51.85 mL/min — ABNORMAL LOW (ref >60.00)
Glucose, Bld: 127 mg/dL — ABNORMAL HIGH (ref 70–99)
Potassium: 4 meq/L (ref 3.5–5.1)
Sodium: 139 meq/L (ref 135–145)

## 2023-03-25 LAB — PHOSPHORUS: Phosphorus: 4.6 mg/dL (ref 2.3–4.6)

## 2023-03-25 LAB — URINALYSIS, ROUTINE W REFLEX MICROSCOPIC
Bilirubin Urine: NEGATIVE
Hgb urine dipstick: NEGATIVE
Ketones, ur: NEGATIVE
Leukocytes,Ua: NEGATIVE
Nitrite: NEGATIVE
Specific Gravity, Urine: 1.02 (ref 1.000–1.030)
Total Protein, Urine: NEGATIVE
Urine Glucose: >=1000 — AB
Urobilinogen, UA: 0.2 (ref 0.0–1.0)
pH: 5.5 (ref 5.0–8.0)

## 2023-03-25 LAB — LIPID PANEL
Cholesterol: 159 mg/dL (ref 0–200)
HDL: 73.6 mg/dL (ref >39.00)
LDL Cholesterol: 66 mg/dL (ref 0–99)
NonHDL: 84.97
Total CHOL/HDL Ratio: 2
Triglycerides: 95 mg/dL (ref 0.0–149.0)
VLDL: 19 mg/dL (ref 0.0–40.0)

## 2023-03-25 LAB — VITAMIN D 25 HYDROXY (VIT D DEFICIENCY, FRACTURES): VITD: 28.02 ng/mL — ABNORMAL LOW (ref 30.00–100.00)

## 2023-03-25 LAB — CBC WITH DIFFERENTIAL/PLATELET
Basophils Absolute: 0.1 10*3/uL (ref 0.0–0.1)
Basophils Relative: 0.6 % (ref 0.0–3.0)
Eosinophils Absolute: 0.2 10*3/uL (ref 0.0–0.7)
Eosinophils Relative: 1.1 % (ref 0.0–5.0)
HCT: 43.6 % (ref 36.0–46.0)
Hemoglobin: 14.1 g/dL (ref 12.0–15.0)
Lymphocytes Relative: 23.6 % (ref 12.0–46.0)
Lymphs Abs: 3.2 10*3/uL (ref 0.7–4.0)
MCHC: 32.4 g/dL (ref 30.0–36.0)
MCV: 86.3 fL (ref 78.0–100.0)
Monocytes Absolute: 0.9 10*3/uL (ref 0.1–1.0)
Monocytes Relative: 6.9 % (ref 3.0–12.0)
Neutro Abs: 9.1 10*3/uL — ABNORMAL HIGH (ref 1.4–7.7)
Neutrophils Relative %: 67.8 % (ref 43.0–77.0)
Platelets: 263 10*3/uL (ref 150.0–400.0)
RBC: 5.05 Mil/uL (ref 3.87–5.11)
RDW: 15.7 % — ABNORMAL HIGH (ref 11.5–15.5)
WBC: 13.4 10*3/uL — ABNORMAL HIGH (ref 4.0–10.5)

## 2023-03-25 LAB — FOLATE: Folate: >25.2 ng/mL (ref >5.9)

## 2023-03-25 LAB — HEPATIC FUNCTION PANEL
ALT: 17 U/L (ref 0–35)
AST: 19 U/L (ref 0–37)
Albumin: 4.3 g/dL (ref 3.5–5.2)
Alkaline Phosphatase: 104 U/L (ref 39–117)
Bilirubin, Direct: 0.1 mg/dL (ref 0.0–0.3)
Total Bilirubin: 0.6 mg/dL (ref 0.2–1.2)
Total Protein: 7.7 g/dL (ref 6.0–8.3)

## 2023-03-25 LAB — TSH: TSH: 0.36 u[IU]/mL (ref 0.35–5.50)

## 2023-03-25 LAB — IBC + FERRITIN
Ferritin: 17.5 ng/mL (ref 10.0–291.0)
Iron: 145 ug/dL (ref 42–145)
Saturation Ratios: 33.1 % (ref 20.0–50.0)
TIBC: 438.2 ug/dL (ref 250.0–450.0)
Transferrin: 313 mg/dL (ref 212.0–360.0)

## 2023-03-25 LAB — VITAMIN B12: Vitamin B-12: 168 pg/mL — ABNORMAL LOW (ref 211–911)

## 2023-03-25 LAB — HEMOGLOBIN A1C: Hgb A1c MFr Bld: 7.2 % — ABNORMAL HIGH (ref 4.6–6.5)

## 2023-03-25 MED ORDER — CHOLECALCIFEROL 50 MCG (2000 UT) PO TABS: 1.0000 | ORAL_TABLET | Freq: Every day | ORAL | 1 refills | Status: DC

## 2023-03-25 MED ORDER — DENOSUMAB 60 MG/ML ~~LOC~~ SOSY: 60.0000 mg | PREFILLED_SYRINGE | Freq: Once | SUBCUTANEOUS | 0 refills | Status: DC

## 2023-03-25 NOTE — Progress Notes (Signed)
Ask her to come in soon for a B12 injection

## 2023-03-28 ENCOUNTER — Other Ambulatory Visit (HOSPITAL_COMMUNITY): Payer: Self-pay

## 2023-03-28 ENCOUNTER — Telehealth: Payer: Self-pay | Admitting: Internal Medicine

## 2023-03-28 NOTE — Telephone Encounter (Signed)
 Copied from CRM [id number]. Topic: Clinical - Lab/Test Results >> Mar 28, 2023  1:39 PM [place] J wrote: Reason for CRM: Vickie from [employer] in due to a lab not preformed. Specimen was received frozen. Vickie sent in a fax with detail of specimen.  Lab not preformed: Reticulocyte Count

## 2023-03-30 ENCOUNTER — Ambulatory Visit: Payer: Self-pay | Admitting: Internal Medicine

## 2023-03-30 ENCOUNTER — Ambulatory Visit: Payer: Medicare HMO

## 2023-03-30 LAB — VITAMIN B1: Vitamin B1 (Thiamine): 11 nmol/L (ref 8–30)

## 2023-03-30 LAB — EXTRA SPECIMEN

## 2023-03-30 LAB — ZINC: Zinc: 77 ug/dL (ref 60–130)

## 2023-03-30 LAB — RETICULOCYTES

## 2023-03-30 NOTE — Telephone Encounter (Signed)
Called pt and relayed PCP recommendations, pt verbalized understanding

## 2023-03-30 NOTE — Telephone Encounter (Signed)
Pharmacy Patient Advocate Encounter  Received notification from [facility] that Prior Authorization for Trihexphenidyl 2mg  tabs has been DENIED.  Full denial letter will be uploaded to the media tab. See denial reason below.   PA #/Case ID/Reference #: [id number]

## 2023-03-30 NOTE — Telephone Encounter (Signed)
Will check to see if the fax was received when I am back in office and not remote.

## 2023-03-30 NOTE — Telephone Encounter (Signed)
Lab work reviewed per Dr.Jones notation in chart. Patient questioned if she needs to start on Vitamin D supplement. Patient will need follow up call.   Copied from CRM [id number]. Topic: Clinical - Lab/Test Results >> Mar 30, 2023  9:40 AM [place] J wrote: Reason for CRM: Patient would like to go over lab results. Said someone by the name of Jasmine from [facility] called her on a private phone number and told patient to call her back on her home phone number to go over these labs. Reason for Disposition  [1] Other NON-URGENT information for PCP AND [2] does not require PCP response  Answer Assessment - Initial Assessment Questions 1. REASON FOR CALL or QUESTION: "What is your reason for calling today?" or "How can I best help you?" or "What question do you have that I can help answer?"     Patient calling back after speaking with Jazmine from [facility]. Patient was wanting to go over lab results 2. CALLER: Document the source of call. (e.g., laboratory, patient).     patient  Protocols used: PCP Call - No Triage-A-AH

## 2023-03-30 NOTE — Telephone Encounter (Signed)
Per CRM, lab work was reviewed and pt would like to know if she should start on Vitamin D supplement?

## 2023-04-04 ENCOUNTER — Ambulatory Visit (INDEPENDENT_AMBULATORY_CARE_PROVIDER_SITE_OTHER): Payer: Medicare HMO

## 2023-04-04 DIAGNOSIS — E538 Deficiency of other specified B group vitamins: Secondary | ICD-10-CM | POA: Diagnosis not present

## 2023-04-04 MED ORDER — CYANOCOBALAMIN 1000 MCG/ML IJ SOLN
1000.0000 ug | Freq: Once | INTRAMUSCULAR | Status: AC
  Administered 2023-04-04: 1000 ug via INTRAMUSCULAR

## 2023-04-04 NOTE — Telephone Encounter (Signed)
 Don't have any paper work from the lab. Also don't have enough information to call them back.

## 2023-04-04 NOTE — Progress Notes (Signed)
 After obtaining consent, and per orders of Dr. Yetta Barre, injection of B12 given by Ferdie Ping. Patient instructed to report any adverse reaction to me immediately.

## 2023-04-05 ENCOUNTER — Telehealth: Payer: Self-pay

## 2023-04-05 NOTE — Progress Notes (Signed)
 Care Guide Pharmacy Note  04/05/2023 Name: [name] MRN: [id number] DOB: 1955/04/13  Referred By: Etta Grandchild, MD Reason for referral: Care Coordination (Outreach to schedule with Pharm d )   [name] is a 67 y.o. year old female who is a primary care patient of Etta Grandchild, MD.  Cranston Neighbor was referred to the pharmacist for assistance related to: HTN  An unsuccessful telephone outreach was attempted today to contact the patient who was referred to the pharmacy team for assistance with medication management. Additional attempts will be made to contact the patient.  Penne Lash , RMA     [facility]  [facility], [facility] Guide  Direct Dial: [phone]  Website: [email or web address]

## 2023-04-08 ENCOUNTER — Telehealth: Payer: Self-pay

## 2023-04-08 ENCOUNTER — Other Ambulatory Visit (HOSPITAL_BASED_OUTPATIENT_CLINIC_OR_DEPARTMENT_OTHER): Payer: Self-pay

## 2023-04-08 DIAGNOSIS — M810 Age-related osteoporosis without current pathological fracture: Secondary | ICD-10-CM

## 2023-04-08 NOTE — Telephone Encounter (Signed)
 PA request has been Submitted. New Encounter created for follow up. For additional info see Pharmacy Prior Auth telephone encounter from 04/08/23  *questions expired, renewed prior auth

## 2023-04-08 NOTE — Telephone Encounter (Signed)
 Pharmacy Patient Advocate Encounter   Received notification from CoverMyMeds that prior authorization for Dicyclomine HCl 10MG  capsules is required/requested.   Insurance verification completed.   The patient is insured through [facility].   Per test claim: PA required; PA submitted to above mentioned insurance via CoverMyMeds Key/confirmation #/EOC [id number] Status is pending

## 2023-04-08 NOTE — Telephone Encounter (Signed)
 Prolia injection received for patient 04/07/2023.

## 2023-04-08 NOTE — Telephone Encounter (Signed)
 Pharmacy Patient Advocate Encounter  Received notification from [facility] that Prior Authorization for Dicyclomine HCl 10MG  capsules  has been APPROVED from 02/09/23 to 02/08/24   PA #/Case ID/Reference #: [id number]

## 2023-04-11 ENCOUNTER — Telehealth: Payer: Self-pay

## 2023-04-11 NOTE — Telephone Encounter (Signed)
 Copied from CRM [id number]. Topic: Clinical - Lab/Test Results >> Apr 08, 2023  3:23 PM Rodman Pickle T wrote: Reason for CRM: patient is wanting a call back from jasmine who called her from the office to go over lab results she stated that a voicemail was left

## 2023-04-12 NOTE — Telephone Encounter (Signed)
 Spoke with patient about her labs. She gave a verbal understanding.

## 2023-04-14 ENCOUNTER — Telehealth: Payer: Self-pay | Admitting: Nurse Practitioner

## 2023-04-15 NOTE — Progress Notes (Signed)
 Care Guide Pharmacy Note  04/15/2023 Name: [name] MRN: [id number] DOB: [birth year]  Referred By: Etta Grandchild, MD Reason for referral: Care Coordination (Outreach to schedule with Pharm d )   [name] is a 68 y.o. year old female who is a primary care patient of Etta Grandchild, MD.  [name] was referred to the pharmacist for assistance related to: HTN  Successful contact was made with the patient to discuss pharmacy services including being ready for the pharmacist to call at least 5 minutes before the scheduled appointment time and to have medication bottles and any blood pressure readings ready for review. The patient agreed to meet with the pharmacist via telephone visit on (04/28/2023 at 2pm.  [name], CMA, Care Guide [facility], [facility] Guide Direct Dial: [phone]  Fax: [phone] Website: [email or web address]

## 2023-04-15 NOTE — Progress Notes (Signed)
 Care Guide Pharmacy Note  04/15/2023 Name: BEAULAH ROMANEK MRN: [id number] DOB: 05/29/1955  Referred By: Etta Grandchild, MD Reason for referral: Care Coordination (Outreach to schedule with Pharm d )   NOLITA KUTTER is a 68 y.o. year old female who is a primary care patient of Etta Grandchild, MD.  Cranston Neighbor was referred to the pharmacist for assistance related to: HTN  A second unsuccessful telephone outreach was attempted today to contact the patient who was referred to the pharmacy team for assistance with medication management. Additional attempts will be made to contact the patient.  [name], CMA, Care Guide [facility]  [facility], [facility] Guide Direct Dial: [phone]  Fax: [phone] Website: [email or web address]

## 2023-04-15 NOTE — Telephone Encounter (Signed)
 rescheduled pt to 3/12 & answered pt's questions, Nothing further needed.

## 2023-04-18 ENCOUNTER — Ambulatory Visit: Payer: Medicare HMO | Admitting: Nurse Practitioner

## 2023-04-20 ENCOUNTER — Telehealth: Payer: Self-pay | Admitting: Primary Care

## 2023-04-20 ENCOUNTER — Encounter: Admitting: Primary Care

## 2023-04-20 ENCOUNTER — Encounter: Payer: Self-pay | Admitting: Primary Care

## 2023-04-20 DIAGNOSIS — F3132 Bipolar disorder, current episode depressed, moderate: Secondary | ICD-10-CM | POA: Diagnosis not present

## 2023-04-20 NOTE — Progress Notes (Signed)
 This encounter was created in error - please disregard.

## 2023-04-20 NOTE — Telephone Encounter (Signed)
 Patient was not ready for activation today, she was noticed to have deviation to the right of her tongue upon exam. Todays apt will be cancelled. She has an apt set up on April 9th schedule with Dr. Wynona Neat which will now be her activation visit. Can we credit copay back to patients account

## 2023-04-22 NOTE — Telephone Encounter (Signed)
 Contacted billing and states it will have to go through insurance before refunding. Billing states we can leave it on the account until the next visit and it would be applied. Left detailed message for patient (per DPR).

## 2023-04-25 MED ORDER — DENOSUMAB 60 MG/ML ~~LOC~~ SOSY: 60.0000 mg | PREFILLED_SYRINGE | Freq: Once | SUBCUTANEOUS | Status: DC

## 2023-04-25 NOTE — Addendum Note (Signed)
 Addended by: Theressa Stamps on: 04/25/2023 04:19 PM   Modules accepted: Orders

## 2023-04-28 ENCOUNTER — Other Ambulatory Visit (INDEPENDENT_AMBULATORY_CARE_PROVIDER_SITE_OTHER): Admitting: Pharmacist

## 2023-04-28 DIAGNOSIS — I10 Essential (primary) hypertension: Secondary | ICD-10-CM

## 2023-04-28 NOTE — Progress Notes (Signed)
 04/28/2023 Name: [name] MRN: [id number] DOB: Dec 24, 1955  Chief Complaint  Patient presents with   Hypertension   Medication Management    VESPER TRANT is a 68 y.o. year old female who presented for a telephone visit.   They were referred to the pharmacist by their PCP for assistance in managing hypertension.    Subjective:  Care Team: Primary Care Provider: Etta Grandchild, MD ; Next Scheduled Visit: 06/21/23   Medication Access/Adherence  Current Pharmacy:  [id number] - [place], [place] - [address] AT [facility] [address] [place] [address] Phone: [phone] Fax: [phone]  [facility] [address], [place] - [address] [place] [address] Phone: [phone] Fax: [phone]  [facility], [place] (New Address) - [place], [place] - [address] [facility] AT Previously: [address] [address], [place] [address] [place] [address] Phone: [phone] Fax: [phone]   Patient reports affordability concerns with their medications: No  Patient reports access/transportation concerns to their pharmacy: No  Patient reports adherence concerns with their medications:  No     Hypertension:  Current medications: Spironolactone 25 mg daily, metoprolol succinate 25 mg daily (recently changed from metoprolol tartrate)  Patient does not have a validated, automated, upper arm home BP cuff (has wrist cuff) Current blood pressure readings: none   Patient denies hypotensive s/sx including dizziness, lightheadedness.  Patient denies hypertensive symptoms including headache, chest pain, shortness of breath     Objective: BP Readings from Last 3 Encounters:  04/20/23 128/70  03/24/23 (!) 132/92  03/09/23 (!) 146/80     Lab Results  Component Value Date   HGBA1C 7.2 (H) 03/24/2023    Lab Results  Component  Value Date   CREATININE 1.11 03/24/2023   BUN 19 03/24/2023   NA 139 03/24/2023   K 4.0 03/24/2023   CL 101 03/24/2023   CO2 26 03/24/2023    Lab Results  Component Value Date   CHOL 159 03/24/2023   HDL 73.60 03/24/2023   LDLCALC 66 03/24/2023   LDLDIRECT 143.0 09/14/2016   TRIG 95.0 03/24/2023   CHOLHDL 2 03/24/2023    Medications Reviewed Today     Reviewed by Bonita Quin, RPH (Pharmacist) on 04/28/23 at 1601  Med List Status: <None>   Medication Order Taking? Sig Documenting Provider Last Dose Status Informant  albuterol (VENTOLIN HFA) 108 (90 Base) MCG/ACT inhaler [id number]  INHALE 1-2 PUFFS INTO THE LUNGS DAILY AS NEEDED FOR WHEEZING OR SHORTNESS OF BREATH.  Patient not taking: Reported on 04/20/2023   Alfonse Spruce, MD  Active   Azelastine HCl 137 MCG/SPRAY SOLN [id number]  PLACE 2 SPRAYS INTO BOTH NOSTRILS 2 (TWO) TIMES DAILY. USE IN EACH NOSTRIL AS DIRECTED Alfonse Spruce, MD  Active   budesonide-formoterol [facility]) 160-4.5 MCG/ACT inhaler [id number]  Inhale 2 puffs into the lungs in the morning and at bedtime. Alfonse Spruce, MD  Expired 04/20/23 2359   Carbinoxamine Maleate 4 MG TABS [id number]  TAKE 1 TABLET BY MOUTH EVERY 6 HOURS Alfonse Spruce, MD  Active   cariprazine (VRAYLAR) 3 MG capsule [id number]  4.5 mg. [provider]  Active   Cholecalciferol 50 MCG (2000 UT) TABS [id number]  Take 1 tablet (2,000 Units total) by mouth daily. Etta Grandchild, MD  Active   clonazePAM [facility]) 1 MG tablet [id number]  Take 1  mg by mouth daily as needed. [provider]  Active   denosumab (PROLIA) injection 60 mg [id number]   Etta Grandchild, MD  Active   dicyclomine (BENTYL) 10 MG capsule [id number]  TAKE 1 CAPSULE (10 MG TOTAL) BY MOUTH 3 (THREE) TIMES DAILY BEFORE MEALS. Etta Grandchild, MD  Active   DULoxetine (CYMBALTA) 60 MG capsule [id number]  Take 60 mg by mouth 2 (two) times daily at 10 AM and 5 PM. [provider]  Active   empagliflozin (JARDIANCE) 10 MG TABS tablet [id number] Yes Take 1 tablet (10 mg total) by mouth daily before breakfast. Etta Grandchild, MD Taking Active   fluticasone (FLONASE) 50 MCG/ACT nasal spray [id number]  PLACE 1 SPRAY INTO BOTH NOSTRILS 2 (TWO) TIMES DAILY AS NEEDED FOR ALLERGIES OR RHINITIS. Alfonse Spruce, MD  Active   fluticasone [facility]) 220 MCG/ACT inhaler [id number]  Inhale into the lungs 2 (two) times daily. [provider]  Active   levothyroxine (SYNTHROID) 25 MCG tablet [id number]  TAKE 1 TABLET BY MOUTH EVERY DAY Etta Grandchild, MD  Active   metoprolol succinate (TOPROL-XL) 25 MG 24 hr tablet [id number] Yes Take 1 tablet (25 mg total) by mouth daily. Etta Grandchild, MD Taking Active   montelukast (SINGULAIR) 10 MG tablet [id number]  TAKE 1 TABLET BY MOUTH EVERYDAY AT BEDTIME Alfonse Spruce, MD  Active   MYRBETRIQ 50 MG TB24 tablet [id number]  Take 50 mg by mouth daily. [provider]  Active   NURTEC 75 MG TBDP [id number]  TAKE 1 TABLET BY MOUTH EVERY OTHER DAY Etta Grandchild, MD  Active   rosuvastatin (CRESTOR) 20 MG tablet [id number]  TAKE 1 TABLET BY MOUTH EVERY DAY Etta Grandchild, MD  Active   spironolactone (ALDACTONE) 25 MG tablet [id number]  TAKE 1 TABLET (25 MG TOTAL) BY MOUTH DAILY. Etta Grandchild, MD  Active   tiZANidine (ZANAFLEX) 2 MG tablet [id number]  Take 1 tablet (2 mg total) by mouth 2 (two) times daily as needed for muscle spasms. Cammy Copa, MD  Active   trihexyphenidyl (ARTANE) 2 MG tablet [id number]  TAKE 1 TABLET BY MOUTH EVERY DAY Etta Grandchild, MD  Active   zinc gluconate 50 MG tablet [id number]  Take 50 mg by mouth daily. [provider]  Active               Assessment/Plan:   Hypertension: - Currently controlled, BP goal <130/80 - BP and HR improved at 3/12 appt - Reviewed appropriate blood pressure monitoring technique and reviewed goal blood pressure. Recommended to check home  blood pressure and heart rate for 1-2 weeks to ensure averaging below goal - Recommend to continue current regimen     Follow Up Plan: PRN  Arbutus Leas, PharmD, BCPS, CPP Clinical Pharmacist Practitioner [facility] [phone]

## 2023-04-29 ENCOUNTER — Ambulatory Visit: Payer: Medicare HMO

## 2023-04-29 VITALS — BP 122/70 | HR 103 | Ht 59.0 in | Wt 148.6 lb

## 2023-04-29 DIAGNOSIS — Z0000 Encounter for general adult medical examination without abnormal findings: Secondary | ICD-10-CM

## 2023-04-29 DIAGNOSIS — Z1231 Encounter for screening mammogram for malignant neoplasm of breast: Secondary | ICD-10-CM

## 2023-04-29 DIAGNOSIS — Z780 Asymptomatic menopausal state: Secondary | ICD-10-CM

## 2023-04-29 NOTE — Patient Instructions (Addendum)
 [name] , Thank you for taking time to come for your Medicare Wellness Visit. I appreciate your ongoing commitment to your health goals. Please review the following plan we discussed and let me know if I can assist you in the future.   Referrals/Orders/Follow-Ups/Clinician Recommendations: It was nice meeting you today.  Keep up the good work.  Remember to discuss a referral for a hearing test at your next office visit.  Keep up the good work.  You have an order for:   [x]   3D Mammogram  [x]   Bone Density     Please call for appointment:  [facility] [address] [place], [place] [address] [phone]  Make sure to wear two-piece clothing.  No lotions, powders, or deodorants the day of the appointment. Make sure to bring picture ID and insurance card.  Bring list of medications you are currently taking including any supplements.    This is a list of the screening recommended for you and due dates:  Health Maintenance  Topic Date Due   Medicare Annual Wellness Visit  03/26/2023   Mammogram  07/22/2023   DEXA scan (bone density measurement)  08/20/2023   Hemoglobin A1C  09/21/2023   Yearly kidney health urinalysis for diabetes  10/25/2023   Complete foot exam   10/25/2023   Eye exam for diabetics  10/27/2023   Yearly kidney function blood test for diabetes  03/23/2024   Colon Cancer Screening  04/02/2024   DTaP/Tdap/Td vaccine (4 - Td or Tdap) 08/26/2030   Pneumonia Vaccine  Completed   Flu Shot  Completed   Hepatitis C Screening  Completed   Zoster (Shingles) Vaccine  Completed   HPV Vaccine  Aged Out   COVID-19 Vaccine  Discontinued    Advanced directives: (Copy Requested) Please bring a copy of your health care power of attorney and living will to the office to be added to your chart at your convenience. You can mail to [facility] [address] [place], [place] [address] or email to [email or web address]  Next  Medicare Annual Wellness Visit scheduled for next year: Yes

## 2023-04-29 NOTE — Progress Notes (Signed)
 Subjective:   [name] is a 68 y.o. who presents for a Medicare Wellness preventive visit.  Visit Complete: In person   Persons Participating in Visit: Patient.  AWV Questionnaire: Yes: Patient Medicare AWV questionnaire was completed by the patient on 04/25/2023; I have confirmed that all information answered by patient is correct and no changes since this date.  Cardiac Risk Factors include: hypertension;advanced age (>42men, >15 women);diabetes mellitus;Other (see comment), Risk factor comments: PVC, OSA, CKD stage 3a     Objective:    Today's Vitals   04/29/23 1345  BP: 122/70  Pulse: (!) 103  SpO2: 99%  Weight: 148 lb 9.6 oz (67.4 kg)  Height: 4\' 11"  (1.499 m)   Body mass index is 30.01 kg/m.     04/29/2023    1:55 PM 03/09/2023    6:40 AM 03/02/2023    5:08 PM 08/16/2022    1:01 PM 03/25/2022    3:19 PM 02/28/2022    8:07 PM 01/12/2022    1:44 PM  Advanced Directives  Does Patient Have a Medical Advance Directive? No No No No No No No  Would patient like information on creating a medical advance directive?  Yes (MAU/Ambulatory/Procedural Areas - Information given) Yes (MAU/Ambulatory/Procedural Areas - Information given) Yes (MAU/Ambulatory/Procedural Areas - Information given) No - Patient declined No - Patient declined No - Patient declined    Current Medications (verified) Outpatient Encounter Medications as of 04/29/2023  Medication Sig   albuterol (VENTOLIN HFA) 108 (90 Base) MCG/ACT inhaler INHALE 1-2 PUFFS INTO THE LUNGS DAILY AS NEEDED FOR WHEEZING OR SHORTNESS OF BREATH.   Azelastine HCl 137 MCG/SPRAY SOLN PLACE 2 SPRAYS INTO BOTH NOSTRILS 2 (TWO) TIMES DAILY. USE IN EACH NOSTRIL AS DIRECTED   Carbinoxamine Maleate 4 MG TABS TAKE 1 TABLET BY MOUTH EVERY 6 HOURS   cariprazine (VRAYLAR) 3 MG capsule 4.5 mg.   clonazePAM (KLONOPIN) 1 MG tablet Take 1 mg by mouth daily as needed.   dicyclomine (BENTYL) 10 MG capsule TAKE 1 CAPSULE (10 MG TOTAL) BY MOUTH 3  (THREE) TIMES DAILY BEFORE MEALS.   DULoxetine (CYMBALTA) 60 MG capsule Take 60 mg by mouth 2 (two) times daily at 10 AM and 5 PM.   empagliflozin (JARDIANCE) 10 MG TABS tablet Take 1 tablet (10 mg total) by mouth daily before breakfast.   fluticasone (FLONASE) 50 MCG/ACT nasal spray PLACE 1 SPRAY INTO BOTH NOSTRILS 2 (TWO) TIMES DAILY AS NEEDED FOR ALLERGIES OR RHINITIS.   fluticasone (FLOVENT HFA) 220 MCG/ACT inhaler Inhale into the lungs 2 (two) times daily.   levothyroxine (SYNTHROID) 25 MCG tablet TAKE 1 TABLET BY MOUTH EVERY DAY   metoprolol succinate (TOPROL-XL) 25 MG 24 hr tablet Take 1 tablet (25 mg total) by mouth daily.   montelukast (SINGULAIR) 10 MG tablet TAKE 1 TABLET BY MOUTH EVERYDAY AT BEDTIME   MYRBETRIQ 50 MG TB24 tablet Take 50 mg by mouth daily.   NURTEC 75 MG TBDP TAKE 1 TABLET BY MOUTH EVERY OTHER DAY   rosuvastatin (CRESTOR) 20 MG tablet TAKE 1 TABLET BY MOUTH EVERY DAY   spironolactone (ALDACTONE) 25 MG tablet TAKE 1 TABLET (25 MG TOTAL) BY MOUTH DAILY.   tiZANidine (ZANAFLEX) 2 MG tablet Take 1 tablet (2 mg total) by mouth 2 (two) times daily as needed for muscle spasms.   trihexyphenidyl (ARTANE) 2 MG tablet TAKE 1 TABLET BY MOUTH EVERY DAY   zinc gluconate 50 MG tablet Take 50 mg by mouth daily.   budesonide-formoterol (  SYMBICORT) 160-4.5 MCG/ACT inhaler Inhale 2 puffs into the lungs in the morning and at bedtime.   Cholecalciferol 50 MCG (2000 UT) TABS Take 1 tablet (2,000 Units total) by mouth daily.   Facility-Administered Encounter Medications as of 04/29/2023  Medication   [START ON 05/09/2023] denosumab (PROLIA) injection 60 mg    Allergies (verified) Emetrol, Indomethacin, Iodinated contrast media, Amlodipine, Carbamazepine, Pseudoephedrine, Risperidone and related, Bupivacaine, Pentazocine, Propoxyphene, Sulfa antibiotics, Tramadol, Aspirin, Codeine, Etodolac, and Propranolol   History: Past Medical History:  Diagnosis Date   Allergic rhinitis     Anxiety    Benign essential tremor    hands   Bipolar 1 disorder, mixed, moderate (HCC)    Claustrophobia    Depression    Eczema    Frequency of urination    GERD (gastroesophageal reflux disease)    History of frequent urinary tract infections    History of gastroesophageal reflux (GERD)    05-25-2017  per pt no issues since hiatal hernia repair 01/ 2018   History of hiatal hernia    History of melanoma excision    early 2000s-- BACK   History of panic attacks    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   Hypothyroidism    Intermittent palpitations    cardiology--  dr [place]   Interstitial cystitis    Limited jaw range of motion    s/p  bilateral TMJ surgery,  age 54s   Migraines    Moderate persistent asthma    pulmologist-- dr Craige Cotta   OA (osteoarthritis)    both knees   OSA on    per last sleep study 09/ 2017  mild OSA, AHI13/hr   PONV (postoperative nausea and vomiting)    PVC (premature ventricular contraction)    Rosacea    Sensation of pressure in bladder area    per pt intermittant   TMJ arthralgia    Urticaria    Wears glasses    White coat syndrome without hypertension    05-25-2017  per pt hx hypertension yrs ago, no issues after quitting stressful job   Past Surgical History:  Procedure Laterality Date   24 HOUR PH STUDY N/A 09/22/2015   Procedure: 24 HOUR PH STUDY;  Surgeon: Sherrilyn Rist, MD;  Location: [facility];  Service: Gastroenterology;  Laterality: N/A;   ADENOIDECTOMY     ANAL FISSURE REPAIR  age 64  (53)   BREAST EXCISIONAL BIOPSY Right 04-16-2003  dr p. young  [facility]   benign   BUNIONECTOMY Right early 2000s   CARPAL TUNNEL RELEASE Left age 73 (55)   CHOLECYSTECTOMY OPEN  age 78   CYSTO WITH HYDRODISTENSION N/A 06/02/2017   Procedure: CYSTOSCOPY/HYDRODISTENSION OF BLADDER;  Surgeon: Bjorn Pippin, MD;  Location: [facility];  Service: Urology;  Laterality: N/A;   CYSTO/ HYDRODISTENTION/  INSTILLATION  THERAPY  1990s   DX LAPAROSCOPY/  DX HYSTEROSCOPY/  D & C  08-07-2007   dr dove  [facility]   ENDOMETRIAL ABLATION W/ NOVASURE  10-21-2008   dr Macon Large  [facility]   ESOPHAGEAL MANOMETRY N/A 09/22/2015   Procedure: ESOPHAGEAL MANOMETRY (EM);  Surgeon: Sherrilyn Rist, MD;  Location: [facility];  Service: Gastroenterology;  Laterality: N/A;  with impedence    FINGER ARTHROPLASTY Bilateral    left little finger 2016:  right middle finger 06/ 2018   IMPLANTATION OF HYPOGLOSSAL NERVE STIMULATOR Right 03/09/2023   Procedure: RIGHT IMPLANTATION OF HYPOGLOSSAL NERVE STIMULATOR;  Surgeon:  Christia Reading, MD;  Location: [facility];  Service: ENT;  Laterality: Right;   KNEE ARTHROSCOPY Bilateral x3 left ;  x3  right-- last one age 14 (46)   NASAL SEPTUM SURGERY  age 46s   ROBOT ASSISTED REDUCTION PARAESOPHAGEAL HIATAL HERNIA/ TYPE 2 MEDIASTINAL DISSECTION/ PRIMARY HIATAL HERNIA REPAIR/ ANTERIOR & POSTERIOR GASTROPEXY/ NISSEN FUNDOPLICATION  03-04-2017   DR GROSS  [facility]   SINOSCOPY     TEMPOROMANDIBULAR JOINT SURGERY Bilateral x3  -- age 67s   per pt used graft   TONSILLECTOMY     TOTAL KNEE ARTHROPLASTY Left 12/26/2017   Procedure: LEFT TOTAL KNEE ARTHROPLASTY;  Surgeon: Ollen Gross, MD;  Location: [facility];  Service: Orthopedics;  Laterality: Left;    TRANSTHORACIC ECHOCARDIOGRAM  12-06-2017   dr [place]   ef 55-60%, grade 1 diastoilc dysfunction, trivial MR   TRIGGER FINGER RELEASE Bilateral last one 2017   several release's bilaterally   ULNAR NERVE TRANSPOSITION Bilateral age 67 (10)   Family History  Problem Relation Age of Onset   Hypertension Mother        deceased from MVA complications   Thyroid disease Mother    Allergic rhinitis Mother    Testicular cancer Father    Allergic rhinitis Father    Colon cancer Sister    Colon polyps Sister    Healthy Sister    Healthy Brother    Coronary artery disease Other    Stomach cancer Neg Hx    Social History   Socioeconomic  History   Marital status: Divorced    Spouse name: Not on file   Number of children: 0   Years of education: Not on file   Highest education level: Bachelor's degree (e.g., BA, AB, BS)  Occupational History   Occupation: disability  Tobacco Use   Smoking status: Never    Passive exposure: Never   Smokeless tobacco: Never  Vaping Use   Vaping status: Never Used  Substance and Sexual Activity   Alcohol use: Yes    Alcohol/week: 0.0 standard drinks of alcohol    Comment: once every 3-4 months   Drug use: No   Sexual activity: Not Currently    Partners: Male    Birth control/protection: Post-menopausal    Comment: ablation  Other Topics Concern   Not on file  Social History Narrative   Lives alone with 2 cats.   Social Drivers of Health   Financial Resource Strain: Medium Risk (04/29/2023)   Overall Financial Resource Strain (CARDIA)    Difficulty of Paying Living Expenses: Somewhat hard  Food Insecurity: No Food Insecurity (04/29/2023)   Hunger Vital Sign    Worried About Running Out of Food in the Last Year: Never true    Ran Out of Food in the Last Year: Never true  Recent Concern: Food Insecurity - Food Insecurity Present (03/22/2023)   Hunger Vital Sign    Worried About Running Out of Food in the Last Year: Sometimes true    Ran Out of Food in the Last Year: Sometimes true  Transportation Needs: No Transportation Needs (04/29/2023)   PRAPARE - Administrator, Civil Service (Medical): No    Lack of Transportation (Non-Medical): No  Physical Activity: Inactive (04/29/2023)   Exercise Vital Sign    Days of Exercise per Week: 0 days    Minutes of Exercise per Session: 0 min  Stress: Stress Concern Present (04/29/2023)   [employer] of Occupational Health - Occupational Stress  Questionnaire    Feeling of Stress : Rather much  Social Connections: Moderately Integrated (04/29/2023)   Social Connection and Isolation Panel [NHANES]    Frequency of Communication  with Friends and Family: More than three times a week    Frequency of Social Gatherings with Friends and Family: Once a week    Attends Religious Services: More than 4 times per year    Active Member of [employer] or Organizations: Yes    Attends Banker Meetings: 1 to 4 times per year    Marital Status: Divorced    Tobacco Counseling Counseling given: Not Answered    Clinical Intake:  Pre-visit preparation completed: Yes  Pain : No/denies pain  Pain Score: 6 Pain Type: Chronic pain Pain Location: Shoulder (BOTH) Pain Descriptors / Indicators: Aching, Discomfort Pain Frequency: Intermittent  BMI - recorded: 30.01 Nutritional Status: BMI > 30  Obese Nutritional Risks: None  Lab Results  Component Value Date   HGBA1C 7.2 (H) 03/24/2023   HGBA1C 6.8 (H) 10/25/2022   HGBA1C 6.3 03/02/2022     How often do you need to have someone help you when you read instructions, pamphlets, or other written materials from your doctor or pharmacy?: 1 - Never  Interpreter Needed?: No  Information entered by :: Damarrion Mimbs, RMA   Activities of Daily Living     04/25/2023    4:07 PM 03/09/2023    6:44 AM  In your present state of health, do you have any difficulty performing the following activities:  Hearing? 0 0  Vision? 0 0  Difficulty concentrating or making decisions? 0 0  Walking or climbing stairs? 1   Dressing or bathing? 0   Doing errands, shopping? 0   Preparing Food and eating ? N   Using the Toilet? N   In the past six months, have you accidently leaked urine? Y   Do you have problems with loss of bowel control? N   Managing your Medications? N   Managing your Finances? N   Housekeeping or managing your Housekeeping? N     Patient Care Team: Etta Grandchild, MD as PCP - General (Internal Medicine) [place], Peter M, MD as PCP - Cardiology (Cardiology) Karie Soda, MD as Consulting Physician (General Surgery) Danis, Andreas Blower, MD as Consulting  Physician (Gastroenterology) Verner Chol, [facility] (Inactive) as Pharmacist (Pharmacist) Karle Barr, MD as Referring Physician (Otolaryngology) Jonathon Bellows, NP as Nurse Practitioner (Psychology) Coralyn Helling, MD (Inactive) as Consulting Physician (Pulmonary Disease) Alfonse Spruce, MD as Consulting Physician (Allergy and Immunology) Manning Charity, OD as Referring Physician (Optometry)  Indicate any recent Medical Services you may have received from other than [facility] providers in the past year (date may be approximate).     Assessment:   This is a routine wellness examination for [name].  Hearing/Vision screen Hearing Screening - Comments:: Denies hearing difficulties   Vision Screening - Comments:: Wears eyeglasses   Goals Addressed   None    Depression Screen     04/29/2023    1:59 PM 03/24/2023    3:30 PM 10/08/2022    4:00 PM 03/25/2022    3:12 PM 08/24/2021   10:41 AM 03/24/2021    1:13 PM 11/08/2019    1:40 PM  PHQ 2/9 Scores  PHQ - 2 Score 0 0 4 2 2  0 0  PHQ- 9 Score 5  12 10 12   0    Fall Risk     04/25/2023  4:07 PM 04/20/2023   10:29 AM 03/24/2023    3:30 PM 01/03/2023    9:25 AM 10/08/2022    4:00 PM  Fall Risk   Falls in the past year? 1 1 0 0 1  Number falls in past yr: 1 0 0  0  Injury with Fall? 0 0 0  0  Risk for fall due to :   No Fall Risks No Fall Risks No Fall Risks  Follow up Falls evaluation completed;Falls prevention discussed  Falls evaluation completed Falls evaluation completed Falls evaluation completed    MEDICARE RISK AT HOME:  Medicare Risk at Home Any stairs in or around the home?: (Patient-Rptd) Yes If so, are there any without handrails?: (Patient-Rptd) No Home free of loose throw rugs in walkways, pet beds, electrical cords, etc?: (Patient-Rptd) Yes Adequate lighting in your home to reduce risk of falls?: (Patient-Rptd) Yes Life alert?: (Patient-Rptd) No Use of a cane, walker or w/c?: (Patient-Rptd) No Grab bars in  the bathroom?: (Patient-Rptd) No Shower chair or bench in shower?: (Patient-Rptd) No Elevated toilet seat or a handicapped toilet?: (Patient-Rptd) No  TIMED UP AND GO:  Was the test performed?  Yes  Length of time to ambulate 10 feet: 15 sec Gait steady and fast without use of assistive device  Cognitive Function: Normal: Normal cognitive status assessed by direct observation by this Clinical Health Advisor. No abnormalities found. Patient is able to answer questions in an accurate and timely manner.        03/25/2022    3:21 PM 03/24/2021    1:22 PM  6CIT Screen  What Year? 0 points 0 points  What month? 0 points 0 points  What time? 0 points 0 points  Count back from 20 0 points 0 points  Months in reverse 0 points 0 points  Repeat phrase 2 points 0 points  Total Score 2 points 0 points    Immunizations Immunization History  Administered Date(s) Administered   Fluad Quad(high Dose 65+) 10/29/2021   Fluad Trivalent(High Dose 65+) 10/08/2022   Influenza Split 02/09/2012   Influenza Whole 12/08/2006, 12/01/2007   Influenza,inj,Quad PF,6+ Mos 12/11/2013, 11/21/2014, 10/13/2016, 10/24/2017, 10/31/2018, 11/08/2019   Influenza-Unspecified 12/01/2015, 10/21/2020   Moderna Covid-19 Vaccine Bivalent Booster 38yrs & up 12/29/2020   Moderna Sars-Covid-2 Vaccination 04/28/2019, 05/29/2019, 01/26/2020   PNEUMOCOCCAL CONJUGATE-20 03/09/2021   Pfizer(Comirnaty)Fall Seasonal Vaccine 12 years and older 11/04/2022   Pneumococcal Polysaccharide-23 12/12/2013   Tdap 04/16/2011, 06/25/2013, 08/25/2020   Zoster Recombinant(Shingrix) 05/30/2018, 08/16/2018    Screening Tests Health Maintenance  Topic Date Due   MAMMOGRAM  07/22/2023   DEXA SCAN  08/20/2023   HEMOGLOBIN A1C  09/21/2023   Diabetic kidney evaluation - Urine ACR  10/25/2023   FOOT EXAM  10/25/2023   OPHTHALMOLOGY EXAM  10/27/2023   Diabetic kidney evaluation - eGFR measurement  03/23/2024   Colonoscopy  04/02/2024    Medicare Annual Wellness (AWV)  04/28/2024   DTaP/Tdap/Td (4 - Td or Tdap) 08/26/2030   Pneumonia Vaccine 92+ Years old  Completed   INFLUENZA VACCINE  Completed   Hepatitis C Screening  Completed   Zoster Vaccines- Shingrix  Completed   HPV VACCINES  Aged Out   COVID-19 Vaccine  Discontinued    Health Maintenance  There are no preventive care reminders to display for this patient.  Health Maintenance Items Addressed: See Nurse Notes  Additional Screening:  Vision Screening: Recommended annual ophthalmology exams for early detection of glaucoma and other disorders of the eye.  Dental Screening: Recommended annual dental exams for proper oral hygiene  Community Resource Referral / Chronic Care Management: CRR required this visit?  No   CCM required this visit?  No     Plan:     I have personally reviewed and noted the following in the patient's chart:   Medical and social history Use of alcohol, tobacco or illicit drugs  Current medications and supplements including opioid prescriptions. Patient is not currently taking opioid prescriptions. Functional ability and status Nutritional status Physical activity Advanced directives List of other physicians Hospitalizations, surgeries, and ER visits in previous 12 months Vitals Screenings to include cognitive, depression, and falls Referrals and appointments  In addition, I have reviewed and discussed with patient certain preventive protocols, quality metrics, and best practice recommendations. A written personalized care plan for preventive services as well as general preventive health recommendations were provided to patient.     Hydeia Mcatee L Tyshauna Finkbiner, CMA   04/29/2023   After Visit Summary: (In Person-Printed) AVS printed and given to the patient  Notes: Please refer to Routing Comments.

## 2023-05-02 ENCOUNTER — Ambulatory Visit (INDEPENDENT_AMBULATORY_CARE_PROVIDER_SITE_OTHER): Payer: Medicare HMO

## 2023-05-02 DIAGNOSIS — E538 Deficiency of other specified B group vitamins: Secondary | ICD-10-CM

## 2023-05-02 MED ORDER — CYANOCOBALAMIN 1000 MCG/ML IJ SOLN
1000.0000 ug | Freq: Once | INTRAMUSCULAR | Status: AC
  Administered 2023-05-02: 1000 ug via INTRAMUSCULAR

## 2023-05-02 NOTE — Progress Notes (Signed)
 After obtaining consent, and per orders of Dr. Yetta Barre, injection of B12 given by Ferdie Ping. Patient instructed to report any adverse reaction to me immediately.

## 2023-05-16 ENCOUNTER — Ambulatory Visit (INDEPENDENT_AMBULATORY_CARE_PROVIDER_SITE_OTHER)

## 2023-05-16 ENCOUNTER — Ambulatory Visit: Payer: Self-pay

## 2023-05-16 ENCOUNTER — Encounter: Payer: Self-pay | Admitting: Internal Medicine

## 2023-05-16 ENCOUNTER — Ambulatory Visit (INDEPENDENT_AMBULATORY_CARE_PROVIDER_SITE_OTHER): Admitting: Internal Medicine

## 2023-05-16 VITALS — BP 136/88 | HR 78 | Temp 98.3°F | Ht 59.0 in

## 2023-05-16 DIAGNOSIS — R0781 Pleurodynia: Secondary | ICD-10-CM

## 2023-05-16 MED ORDER — HYDROCODONE-ACETAMINOPHEN 5-325 MG PO TABS
1.0000 | ORAL_TABLET | Freq: Three times a day (TID) | ORAL | 0 refills | Status: DC | PRN
Start: 2023-05-16 — End: 2023-07-11

## 2023-05-16 NOTE — Patient Instructions (Addendum)
      Have an xray downstairs.      Medications changes include :   hydrocodone-acetaminophen for pain     Return if symptoms worsen or fail to improve.

## 2023-05-16 NOTE — Telephone Encounter (Signed)
 Copied from CRM [id number]. Topic: Clinical - Red Word Triage >> May 16, 2023  9:09 AM Gurney Maxin H wrote: [facility] that prompted transfer to Nurse Triage: Rolled off bed last night left rib cage area in pain, hurts to breathe or move.  Chief Complaint: fall Symptoms: fall from bed onto floor and lantern, pain on left ribcage area, hurts to move and breathe Frequency: last night Pertinent Negatives: Patient denies fever, n/v Disposition: [] ED /[] Urgent Care (no appt availability in office) / [x] Appointment(In office/virtual)/ []  [place] [facility]/ [] Home Care/ [] Refused Recommended Disposition /[] [facility]/ []  Follow-up with PCP Additional Notes: pt not allowed to take tylenol or ibuprofen therefore needs something for pain and to be evaluated after fall. Pt has scratch on left rib cage area & scratch/puncture left calf/knee  Reason for Disposition  MILD weakness (i.e., does not interfere with ability to work, go to school, normal activities)  (Exception: Mild weakness is a chronic symptom.)    Pt feel from bed: landed on floor and lantern: has injuries/scratches to left rib cage and left cal/knee: c/o hurts to breathe and move  Answer Assessment - Initial Assessment Questions 1. MECHANISM: "How did the fall happen?"     Rolled out of bed and landed on floor 2. DOMESTIC VIOLENCE AND ELDER ABUSE SCREENING: "Did you fall because someone pushed you or tried to hurt you?" If Yes, ask: "Are you safe now?"     no 3. ONSET: "When did the fall happen?" (e.g., minutes, hours, or days ago)     Last night 4. LOCATION: "What part of the body hit the ground?" (e.g., back, buttocks, head, hips, knees, hands, head, stomach) Left side of body landed on floor and lantern      5. INJURY: "Did you hurt (injure) yourself when you fell?" If Yes, ask: "What did you injure? Tell me more about this?" (e.g., body area; type of injury; pain severity)"     Left rib cage-pain & scartched, left calf/knee  punture wound  6. PAIN: "Is there any pain?" If Yes, ask: "How bad is the pain?" (e.g., Scale 1-10; or mild,  moderate, severe)   - NONE (0): No pain   - MILD (1-3): Doesn't interfere with normal activities    - MODERATE (4-7): Interferes with normal activities or awakens from sleep    - SEVERE (8-10): Excruciating pain, unable to do any normal activities      10/10 7. SIZE: For cuts, bruises, or swelling, ask: "How large is it?" (e.g., inches or centimeters)      unknown 8. PREGNANCY: "Is there any chance you are pregnant?" "When was your last menstrual period?"     N/a 9. OTHER SYMPTOMS: "Do you have any other symptoms?" (e.g., dizziness, fever, weakness; new onset or worsening).      N/a 10. CAUSE: "What do you think caused the fall (or falling)?" (e.g., tripped, dizzy spell)       Rolled out of bed  Protocols used: Falls and [facility]

## 2023-05-16 NOTE — Progress Notes (Signed)
 Subjective:    Patient ID: [name], female    DOB: Jan 20, 1956, 68 y.o.   MRN: [id number]      HPI [name] is here for  Chief Complaint  Patient presents with   Fall    This morning she rolled off the bed - she went to turn over and instead of stopping she rolled off.  She is not sure if she hit the nightstand or just the floor.  She has pain in her left rib area.  Any movement and breathing hurts.  She does not hurt anywhere else.    She did not hit her head.  There was no LOC.  This occurred once prior.    She has not been doing much, but denies shortness of breath, cough or wheeze.  She has not had any fevers.  No lightheadedness or dizziness.   Medications and allergies reviewed with patient and updated if appropriate.  Current Outpatient Medications on File Prior to Visit  Medication Sig Dispense Refill   albuterol (VENTOLIN HFA) 108 (90 Base) MCG/ACT inhaler INHALE 1-2 PUFFS INTO THE LUNGS DAILY AS NEEDED FOR WHEEZING OR SHORTNESS OF BREATH. 18 each 1   Azelastine HCl 137 MCG/SPRAY SOLN PLACE 2 SPRAYS INTO BOTH NOSTRILS 2 (TWO) TIMES DAILY. USE IN EACH NOSTRIL AS DIRECTED 90 mL 0   Carbinoxamine Maleate 4 MG TABS TAKE 1 TABLET BY MOUTH EVERY 6 HOURS 360 tablet 1   cariprazine (VRAYLAR) 3 MG capsule 4.5 mg.     clonazePAM (KLONOPIN) 1 MG tablet Take 1 mg by mouth daily as needed.     dicyclomine (BENTYL) 10 MG capsule TAKE 1 CAPSULE (10 MG TOTAL) BY MOUTH 3 (THREE) TIMES DAILY BEFORE MEALS. 270 capsule 1   DULoxetine (CYMBALTA) 60 MG capsule Take 60 mg by mouth 2 (two) times daily at 10 AM and 5 PM.     empagliflozin (JARDIANCE) 10 MG TABS tablet Take 1 tablet (10 mg total) by mouth daily before breakfast. 90 tablet 1   fluticasone (FLONASE) 50 MCG/ACT nasal spray PLACE 1 SPRAY INTO BOTH NOSTRILS 2 (TWO) TIMES DAILY AS NEEDED FOR ALLERGIES OR RHINITIS. 48 mL 1   fluticasone (FLOVENT HFA) 220 MCG/ACT inhaler Inhale into the lungs 2 (two) times daily.     levothyroxine  (SYNTHROID) 25 MCG tablet TAKE 1 TABLET BY MOUTH EVERY DAY 90 tablet 0   metoprolol succinate (TOPROL-XL) 25 MG 24 hr tablet Take 1 tablet (25 mg total) by mouth daily. 90 tablet 0   montelukast (SINGULAIR) 10 MG tablet TAKE 1 TABLET BY MOUTH EVERYDAY AT BEDTIME 90 tablet 1   MYRBETRIQ 50 MG TB24 tablet Take 50 mg by mouth daily.     NURTEC 75 MG TBDP TAKE 1 TABLET BY MOUTH EVERY OTHER DAY 40 tablet 2   rosuvastatin (CRESTOR) 20 MG tablet TAKE 1 TABLET BY MOUTH EVERY DAY 90 tablet 0   spironolactone (ALDACTONE) 25 MG tablet TAKE 1 TABLET (25 MG TOTAL) BY MOUTH DAILY. 90 tablet 0   tiZANidine (ZANAFLEX) 2 MG tablet Take 1 tablet (2 mg total) by mouth 2 (two) times daily as needed for muscle spasms. 60 tablet 1   trihexyphenidyl (ARTANE) 2 MG tablet TAKE 1 TABLET BY MOUTH EVERY DAY 90 tablet 0   zinc gluconate 50 MG tablet Take 50 mg by mouth daily.     budesonide-formoterol (SYMBICORT) 160-4.5 MCG/ACT inhaler Inhale 2 puffs into the lungs in the morning and at bedtime. 3 each 5  Cholecalciferol 50 MCG (2000 UT) TABS Take 1 tablet (2,000 Units total) by mouth daily. 90 tablet 1   fluticasone (FLOVENT HFA) 220 MCG/ACT inhaler DURING RESPIRATORY INFECTIONS OR WORSENING SYMPTOMS INHALE 4 PUFFS 2 TIMES DAILY FOR UP TO TWO WEEKS Inhalation for 30 Days     Current Facility-Administered Medications on File Prior to Visit  Medication Dose Route Frequency Provider Last Rate Last Admin   denosumab (PROLIA) injection 60 mg  60 mg Subcutaneous Once Etta Grandchild, MD        Review of Systems  Constitutional:  Negative for fever.  Respiratory:  Negative for cough, shortness of breath and wheezing.   Cardiovascular:  Negative for chest pain.  Neurological:  Positive for headaches (mild). Negative for dizziness and light-headedness.       Objective:   Vitals:   05/16/23 1400  BP: 136/88  Pulse: 78  Temp: 98.3 F (36.8 C)  SpO2: 97%   BP Readings from Last 3 Encounters:  05/16/23 136/88   04/29/23 122/70  04/20/23 128/70   Wt Readings from Last 3 Encounters:  04/29/23 148 lb 9.6 oz (67.4 kg)  04/20/23 154 lb 3.2 oz (69.9 kg)  03/24/23 152 lb 8 oz (69.2 kg)   Body mass index is 30.01 kg/m.    Physical Exam Constitutional:      General: She is not in acute distress.    Appearance: Normal appearance.  HENT:     Head: Normocephalic and atraumatic.  Eyes:     Conjunctiva/sclera: Conjunctivae normal.  Cardiovascular:     Rate and Rhythm: Normal rate and regular rhythm.     Heart sounds: Normal heart sounds.  Pulmonary:     Effort: Pulmonary effort is normal. No respiratory distress.     Breath sounds: Normal breath sounds. No wheezing.  Musculoskeletal:        General: Tenderness (left lateral rib cage mid- lower ribs, no obvious deformity) present.     Cervical back: Neck supple.     Right lower leg: No edema.     Left lower leg: No edema.  Lymphadenopathy:     Cervical: No cervical adenopathy.  Skin:    General: Skin is warm and dry.     Findings: No rash.  Neurological:     Mental Status: She is alert. Mental status is at baseline.  Psychiatric:        Mood and Affect: Mood normal.        Behavior: Behavior normal.       DG Ribs Unilateral W/Chest Left CLINICAL DATA:  Fall this morning with left rib pain. Rolled off the bed today.  EXAM: LEFT RIBS AND CHEST - 3+ VIEW  COMPARISON:  Chest radiograph 03/09/2023  FINDINGS: A BB was placed at site of pain overlying the lower ribs. No evidence of fractures subjacent to the BB. Suspected remote fracture of left lateral fifth rib with callus. There is no pneumothorax or pleural effusion. Both lungs are clear. Heart size and mediastinal contours are within normal limits. Right-sided battery pack with stimulator lead coursing into the neck.  IMPRESSION: No acute left rib fracture or pulmonary complication. Suspected remote fracture of left lateral fifth rib.  Electronically Signed   By: Narda Rutherford M.D.   On: 05/16/2023 16:02       Assessment & Plan:    Left rib pain: Acute Result of a fall this morning - rolled out of bed Only pain is left lateral rib cage Denies head trauma  or pain elsewhere No LOC She did ice the area, but has not taken anything for it since Tylenol does not work She is in a fair amount of pain so I will prescribe some hydrocodone-acetaminophen 5-325 mg which she will take only as needed for severe pain for a few days since she has an allergy To tramadol Left rib x-ray, chest x-ray today to rule out fracture

## 2023-05-18 ENCOUNTER — Encounter: Payer: Self-pay | Admitting: Pulmonary Disease

## 2023-05-18 ENCOUNTER — Ambulatory Visit: Payer: Medicare HMO | Admitting: Pulmonary Disease

## 2023-05-18 VITALS — BP 124/74 | HR 89 | Temp 97.9°F | Ht 59.0 in | Wt 153.6 lb

## 2023-05-18 DIAGNOSIS — G4733 Obstructive sleep apnea (adult) (pediatric): Secondary | ICD-10-CM

## 2023-05-18 NOTE — Progress Notes (Signed)
 [name]    [id number]    1955/10/22  Primary Care Physician:Jones, Bernadene Bell, MD  Referring Physician: Etta Grandchild, MD [address] [place],  [place] [address]  Chief complaint:    Patient with moderate obstructive sleep apnea, intolerant of CPAP Had an inspire device placed In for activation today  Visit was made with the assistance of inspire rep  HPI:  In for inspire device activation today  Had inspire device implanted 03/09/2023 Sleep study with moderate obstructive sleep apnea  Implantation went well Has been doing well  Some difficulty initiating sleep, some difficulty maintaining sleep Usually able to fall asleep after about 30 minutes to an hour Will wake up a few times during the night sometimes gets up to get herself going on sometimes takes her to better tolerate  Denies any morning headaches No significant memory issues  Health has been relatively good  Outpatient Encounter Medications as of 05/18/2023  Medication Sig   albuterol (VENTOLIN HFA) 108 (90 Base) MCG/ACT inhaler INHALE 1-2 PUFFS INTO THE LUNGS DAILY AS NEEDED FOR WHEEZING OR SHORTNESS OF BREATH.   Azelastine HCl 137 MCG/SPRAY SOLN PLACE 2 SPRAYS INTO BOTH NOSTRILS 2 (TWO) TIMES DAILY. USE IN EACH NOSTRIL AS DIRECTED   Carbinoxamine Maleate 4 MG TABS TAKE 1 TABLET BY MOUTH EVERY 6 HOURS   cariprazine (VRAYLAR) 3 MG capsule 4.5 mg.   clonazePAM (KLONOPIN) 1 MG tablet Take 1 mg by mouth daily as needed.   dicyclomine (BENTYL) 10 MG capsule TAKE 1 CAPSULE (10 MG TOTAL) BY MOUTH 3 (THREE) TIMES DAILY BEFORE MEALS.   DULoxetine (CYMBALTA) 60 MG capsule Take 60 mg by mouth 2 (two) times daily at 10 AM and 5 PM.   empagliflozin (JARDIANCE) 10 MG TABS tablet Take 1 tablet (10 mg total) by mouth daily before breakfast.   fluticasone (FLONASE) 50 MCG/ACT nasal spray PLACE 1 SPRAY INTO BOTH NOSTRILS 2 (TWO) TIMES DAILY AS NEEDED FOR ALLERGIES OR RHINITIS.   fluticasone (FLOVENT HFA)  220 MCG/ACT inhaler Inhale into the lungs 2 (two) times daily.   fluticasone (FLOVENT HFA) 220 MCG/ACT inhaler DURING RESPIRATORY INFECTIONS OR WORSENING SYMPTOMS INHALE 4 PUFFS 2 TIMES DAILY FOR UP TO TWO WEEKS Inhalation for 30 Days   HYDROcodone-acetaminophen (NORCO/VICODIN) 5-325 MG tablet Take 1-2 tablets by mouth every 8 (eight) hours as needed for moderate pain (pain score 4-6).   levothyroxine (SYNTHROID) 25 MCG tablet TAKE 1 TABLET BY MOUTH EVERY DAY   metoprolol succinate (TOPROL-XL) 25 MG 24 hr tablet Take 1 tablet (25 mg total) by mouth daily.   montelukast (SINGULAIR) 10 MG tablet TAKE 1 TABLET BY MOUTH EVERYDAY AT BEDTIME   MYRBETRIQ 50 MG TB24 tablet Take 50 mg by mouth daily.   NURTEC 75 MG TBDP TAKE 1 TABLET BY MOUTH EVERY OTHER DAY   rosuvastatin (CRESTOR) 20 MG tablet TAKE 1 TABLET BY MOUTH EVERY DAY   spironolactone (ALDACTONE) 25 MG tablet TAKE 1 TABLET (25 MG TOTAL) BY MOUTH DAILY.   tiZANidine (ZANAFLEX) 2 MG tablet Take 1 tablet (2 mg total) by mouth 2 (two) times daily as needed for muscle spasms.   trihexyphenidyl (ARTANE) 2 MG tablet TAKE 1 TABLET BY MOUTH EVERY DAY   zinc gluconate 50 MG tablet Take 50 mg by mouth daily.   budesonide-formoterol (SYMBICORT) 160-4.5 MCG/ACT inhaler Inhale 2 puffs into the lungs in the morning and at bedtime.   Cholecalciferol 50 MCG (2000 UT) TABS Take  1 tablet (2,000 Units total) by mouth daily.   Facility-Administered Encounter Medications as of 05/18/2023  Medication   denosumab (PROLIA) injection 60 mg    Allergies as of 05/18/2023 - Review Complete 05/18/2023  Allergen Reaction Noted   Emetrol Itching 08/21/2018   Indomethacin Other (See Comments) 08/17/2016   Iodinated contrast media Itching 04/16/2011   Amlodipine Swelling 12/26/2014   Carbamazepine Other (See Comments) 04/28/2018   Pseudoephedrine Other (See Comments) 06/20/2014   Risperidone and related Other (See Comments) 12/12/2012   Bupivacaine Hives 05/30/2013    Pentazocine Other (See Comments) 06/20/2014   Propoxyphene Itching and Nausea And Vomiting 06/20/2014   Sulfa antibiotics Other (See Comments) 05/24/2017   Tramadol Other (See Comments) 06/30/2016   Aspirin Other (See Comments) 02/15/2014   Codeine Itching and Other (See Comments) 05/30/2013   Etodolac Rash and Other (See Comments) 06/20/2014   Propranolol Nausea Only and Other (See Comments) 12/12/2012    Past Medical History:  Diagnosis Date   Allergic rhinitis    Anxiety    Benign essential tremor    hands   Bipolar 1 disorder, mixed, moderate (HCC)    Claustrophobia    Depression    Eczema    Frequency of urination    GERD (gastroesophageal reflux disease)    History of frequent urinary tract infections    History of gastroesophageal reflux (GERD)    05-25-2017  per pt no issues since hiatal hernia repair 01/ 2018   History of hiatal hernia    History of melanoma excision    early 2000s-- BACK   History of panic attacks    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   Hypothyroidism    Intermittent palpitations    cardiology--  dr [place]   Interstitial cystitis    Limited jaw range of motion    s/p  bilateral TMJ surgery,  age 89s   Migraines    Moderate persistent asthma    pulmologist-- dr Craige Cotta   OA (osteoarthritis)    both knees   OSA on    per last sleep study 09/ 2017  mild OSA, AHI13/hr   PONV (postoperative nausea and vomiting)    PVC (premature ventricular contraction)    Rosacea    Sensation of pressure in bladder area    per pt intermittant   TMJ arthralgia    Urticaria    Wears glasses    White coat syndrome without hypertension    05-25-2017  per pt hx hypertension yrs ago, no issues after quitting stressful job    Past Surgical History:  Procedure Laterality Date   24 HOUR PH STUDY N/A 09/22/2015   Procedure: 24 HOUR PH STUDY;  Surgeon: Sherrilyn Rist, MD;  Location: [facility];  Service: Gastroenterology;  Laterality:  N/A;   ADENOIDECTOMY     ANAL FISSURE REPAIR  age 14  (26)   BREAST EXCISIONAL BIOPSY Right 04-16-2003  dr p. young  [facility]   benign   BUNIONECTOMY Right early 2000s   CARPAL TUNNEL RELEASE Left age 26 (51)   CHOLECYSTECTOMY OPEN  age 17   CYSTO WITH HYDRODISTENSION N/A 06/02/2017   Procedure: CYSTOSCOPY/HYDRODISTENSION OF BLADDER;  Surgeon: Bjorn Pippin, MD;  Location: [facility]  Service: Urology;  Laterality: N/A;   CYSTO/ HYDRODISTENTION/  INSTILLATION THERAPY  1990s   DX LAPAROSCOPY/  DX HYSTEROSCOPY/  D & C  08-07-2007   dr dove  [facility]   ENDOMETRIAL ABLATION W/ NOVASURE  10-21-2008   dr Macon Large  [facility]   ESOPHAGEAL MANOMETRY N/A 09/22/2015   Procedure: ESOPHAGEAL MANOMETRY (EM);  Surgeon: Sherrilyn Rist, MD;  Location: [facility];  Service: Gastroenterology;  Laterality: N/A;  with impedence    FINGER ARTHROPLASTY Bilateral    left little finger 2016:  right middle finger 06/ 2018   IMPLANTATION OF HYPOGLOSSAL NERVE STIMULATOR Right 03/09/2023   Procedure: RIGHT IMPLANTATION OF HYPOGLOSSAL NERVE STIMULATOR;  Surgeon: Christia Reading, MD;  Location: [facility];  Service: ENT;  Laterality: Right;   KNEE ARTHROSCOPY Bilateral x3 left ;  x3  right-- last one age 38 (44)   NASAL SEPTUM SURGERY  age 61s   ROBOT ASSISTED REDUCTION PARAESOPHAGEAL HIATAL HERNIA/ TYPE 2 MEDIASTINAL DISSECTION/ PRIMARY HIATAL HERNIA REPAIR/ ANTERIOR & POSTERIOR GASTROPEXY/ NISSEN FUNDOPLICATION  03-04-2017   DR GROSS  [facility]   SINOSCOPY     TEMPOROMANDIBULAR JOINT SURGERY Bilateral x3  -- age 7s   per pt used graft   TONSILLECTOMY     TOTAL KNEE ARTHROPLASTY Left 12/26/2017   Procedure: LEFT TOTAL KNEE ARTHROPLASTY;  Surgeon: Ollen Gross, MD;  Location: [facility];  Service: Orthopedics;  Laterality: Left;    TRANSTHORACIC ECHOCARDIOGRAM  12-06-2017   dr [place]   ef 55-60%, grade 1 diastoilc dysfunction, trivial MR   TRIGGER FINGER RELEASE Bilateral last one 2017   several  release's bilaterally   ULNAR NERVE TRANSPOSITION Bilateral age 46 (59)    Family History  Problem Relation Age of Onset   Hypertension Mother        deceased from MVA complications   Thyroid disease Mother    Allergic rhinitis Mother    Testicular cancer Father    Allergic rhinitis Father    Colon cancer Sister    Colon polyps Sister    Healthy Sister    Healthy Brother    Coronary artery disease Other    Stomach cancer Neg Hx     Social History   Socioeconomic History   Marital status: Divorced    Spouse name: Not on file   Number of children: 0   Years of education: Not on file   Highest education level: Bachelor's degree (e.g., BA, AB, BS)  Occupational History   Occupation: disability  Tobacco Use   Smoking status: Never    Passive exposure: Never   Smokeless tobacco: Never  Vaping Use   Vaping status: Never Used  Substance and Sexual Activity   Alcohol use: Yes    Alcohol/week: 0.0 standard drinks of alcohol    Comment: once every 3-4 months   Drug use: No   Sexual activity: Not Currently    Partners: Male    Birth control/protection: Post-menopausal    Comment: ablation  Other Topics Concern   Not on file  Social History Narrative   Lives alone with 2 cats.   Social Drivers of Health   Financial Resource Strain: Medium Risk (04/29/2023)   Overall Financial Resource Strain (CARDIA)    Difficulty of Paying Living Expenses: Somewhat hard  Food Insecurity: No Food Insecurity (04/29/2023)   Hunger Vital Sign    Worried About Running Out of Food in the Last Year: Never true    Ran Out of Food in the Last Year: Never true  Recent Concern: Food Insecurity - Food Insecurity Present (03/22/2023)   Hunger Vital Sign    Worried About Running Out of Food in the Last Year: Sometimes true    Ran Out of Food  in the Last Year: Sometimes true  Transportation Needs: No Transportation Needs (04/29/2023)   PRAPARE - Administrator, Civil Service (Medical):  No    Lack of Transportation (Non-Medical): No  Physical Activity: Inactive (04/29/2023)   Exercise Vital Sign    Days of Exercise per Week: 0 days    Minutes of Exercise per Session: 0 min  Stress: Stress Concern Present (04/29/2023)   [employer] of Occupational Health - Occupational Stress Questionnaire    Feeling of Stress : Rather much  Social Connections: Moderately Integrated (04/29/2023)   Social Connection and Isolation Panel [NHANES]    Frequency of Communication with Friends and Family: More than three times a week    Frequency of Social Gatherings with Friends and Family: Once a week    Attends Religious Services: More than 4 times per year    Active Member of [employer] or Organizations: Yes    Attends Banker Meetings: 1 to 4 times per year    Marital Status: Divorced  Intimate Partner Violence: Patient Unable To Answer (04/29/2023)   Humiliation, Afraid, Rape, and Kick questionnaire    Fear of Current or Ex-Partner: Patient unable to answer    Emotionally Abused: Patient unable to answer    Physically Abused: Patient unable to answer    Sexually Abused: Patient unable to answer    Review of Systems  Respiratory:  Positive for apnea.   Psychiatric/Behavioral:  Positive for sleep disturbance.     Vitals:   05/18/23 1559  BP: 124/74  Pulse: 89  Temp: 97.9 F (36.6 C)  SpO2: 95%     Physical Exam Constitutional:      Appearance: She is obese.  HENT:     Head: Normocephalic.     Mouth/Throat:     Mouth: Mucous membranes are moist.  Eyes:     General: No scleral icterus. Cardiovascular:     Rate and Rhythm: Normal rate and regular rhythm.     Heart sounds: No murmur heard.    No friction rub.  Pulmonary:     Effort: No respiratory distress.     Breath sounds: No stridor. No wheezing or rhonchi.  Musculoskeletal:     Cervical back: No rigidity or tenderness.  Neurological:     Mental Status: She is alert.  Psychiatric:        Mood and  Affect: Mood normal.    Tongue movement is midline Able to deviate tongue to the left into the right  Data Reviewed: Sleep study reviewed  Recent records from visit to Dr. Jenne Pane reviewed    Assessment:  Inspire device activation today Sensation voltage of 0.6 Functional voltage of 0.8 Therapy duration of 8 hours Activation delay of 1 hour Pause of 15 minutes  Following activation of the device, waveform was drawn, Patient was educated on how to use the device and how to escalate therapy  Moderate obstructive sleep apnea with hypoglossal nerve stimulator in place   Plan/Recommendations: Inspire device activated  Follow-up in about 4 weeks  Encouraged to call with significant concerns   Virl Diamond MD [facility] 05/18/2023, 4:31 PM  CC: Etta Grandchild, MD

## 2023-05-18 NOTE — Patient Instructions (Addendum)
 Your device was activated today  Increase voltage on a weekly basis as tolerated  Call us with significant concerns  Will see you in about 4 weeks from here

## 2023-05-19 ENCOUNTER — Ambulatory Visit: Payer: Medicare HMO | Admitting: Allergy & Immunology

## 2023-05-19 ENCOUNTER — Encounter: Payer: Self-pay | Admitting: Pulmonary Disease

## 2023-05-19 ENCOUNTER — Encounter: Payer: Self-pay | Admitting: Allergy & Immunology

## 2023-05-19 VITALS — BP 124/80 | HR 116 | Temp 97.2°F | Ht 59.0 in | Wt 147.0 lb

## 2023-05-19 DIAGNOSIS — J383 Other diseases of vocal cords: Secondary | ICD-10-CM

## 2023-05-19 DIAGNOSIS — J3089 Other allergic rhinitis: Secondary | ICD-10-CM | POA: Diagnosis not present

## 2023-05-19 DIAGNOSIS — J302 Other seasonal allergic rhinitis: Secondary | ICD-10-CM

## 2023-05-19 DIAGNOSIS — J4540 Moderate persistent asthma, uncomplicated: Secondary | ICD-10-CM

## 2023-05-19 MED ORDER — PREDNISONE 10 MG PO TABS: ORAL_TABLET | ORAL | 0 refills | Status: DC

## 2023-05-19 MED ORDER — BREZTRI AEROSPHERE 160-9-4.8 MCG/ACT IN AERO: 2.0000 | INHALATION_SPRAY | Freq: Two times a day (BID) | RESPIRATORY_TRACT | 5 refills | Status: AC

## 2023-05-19 MED ORDER — AZELASTINE HCL 137 MCG/SPRAY NA SOLN: 2.0000 | Freq: Two times a day (BID) | NASAL | 3 refills | Status: AC

## 2023-05-19 MED ORDER — METHYLPREDNISOLONE ACETATE 40 MG/ML IJ SUSP
40.0000 mg | Freq: Once | INTRAMUSCULAR | Status: AC
  Administered 2023-05-19: 40 mg via INTRAMUSCULAR

## 2023-05-19 MED ORDER — MONTELUKAST SODIUM 10 MG PO TABS: 10.0000 mg | ORAL_TABLET | Freq: Every day | ORAL | 3 refills | Status: DC

## 2023-05-19 MED ORDER — CARBINOXAMINE MALEATE 4 MG PO TABS: 1.0000 | ORAL_TABLET | Freq: Four times a day (QID) | ORAL | 1 refills | Status: AC

## 2023-05-19 NOTE — Patient Instructions (Addendum)
 1. Moderate persistent asthma, uncomplicated - Spirometry looked very good today. - We will go ahead and send in Breztri to see how well this is covered.  - Daily controller medication(s): Singulair 10mg  daily and Breztri 2 puffs twice daily with spacer - Prior to physical activity: albuterol 2 puffs 10-15 minutes before physical activity. - Rescue medications: albuterol 4 puffs every 4-6 hours as needed - Changes during respiratory infections or worsening symptoms: Add on Arnuity 1 puff one times daily for TWO WEEKS. - Asthma control goals:  * Full participation in all desired activities (may need albuterol before activity) * Albuterol use two time or less a week on average (not counting use with activity) * Cough interfering with sleep two time or less a month * Oral steroids no more than once a year * No hospitalizations  2. Seasonal and perennial allergic rhinitis - Continue with Singulair (montelukast) 10mg  once daily. - Continue with carbinoxamine up to three times daily. - Continue with azelastine one spray per nostril up to TWICE as needed depending on your symptoms.  - DepoMedrol 40mg  given in clinic today. - Start the low dose prednisone taper: Take one tablet (10mg ) twice daily for six days days, then one tablet (10mg ) once daily for six days, then STOP.  3. Return in about 6 months (around 11/18/2023). You can have the follow up appointment with Dr. Dellis Anes or a Nurse Practicioner (our Nurse Practitioners are excellent and always have Physician oversight!).    Please inform us of any Emergency Department visits, hospitalizations, or changes in symptoms. Call us before going to the ED for breathing or allergy symptoms since we might be able to fit you in for a sick visit. Feel free to contact us anytime with any questions, problems, or concerns.  It was a pleasure to see you again today!  Websites that have reliable patient information: 1. American Academy of Asthma,  Allergy, and Immunology: www.aaaai.org 2. Food Allergy Research and Education (FARE): foodallergy.org 3. Mothers of Asthmatics: http://www.asthmacommunitynetwork.org 4. American College of Allergy, Asthma, and Immunology: www.acaai.org      "Like" [place] on Facebook and Instagram for our latest updates!      A healthy democracy works best when [employer] participate! Make sure you are registered to vote! If you have moved or changed any of your contact information, you will need to get this updated before voting! Scan the QR codes below to learn more!                 -

## 2023-05-19 NOTE — Progress Notes (Unsigned)
 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   Subjective:   [name] is a 68 y.o. female presenting today for follow up of No chief complaint on file.   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 03/25/2023   Deficiency anemia 03/24/2023   Tachycardia 03/24/2023   Allergic contact dermatitis due to drugs in contact with skin 11/23/2022   Type 2 diabetes mellitus with stage 3b chronic kidney disease, without long-term current use of insulin (HCC) 10/26/2022   Idiopathic hypotension 10/25/2022   Type 2 diabetes mellitus with stage 3a chronic kidney disease, without long-term current use of insulin (HCC) 07/09/2022   Hyperlipidemia LDL goal <100 08/24/2021   Encounter for general adult medical examination with abnormal findings 03/14/2021   Estrogen deficiency 03/09/2021   Cervical cancer screening 03/09/2021   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   Grade I diastolic dysfunction 03/12/2020   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   Osteoarthritis of glenohumeral joint 03/26/2019   Iron deficiency anemia 04/28/2018   OA (osteoarthritis) of knee 12/26/2017   PVC (premature ventricular contraction) 11/21/2017   Vitamin D deficiency, unspecified 10/13/2016   Primary osteoarthritis of left hand 06/30/2016   Osteoporosis without current pathological fracture 03/22/2016   Paraesophageal hiatal hernia s/p robotic repair & fundoplication 03/04/2016 03/04/2016   Bipolar 1 disorder, mixed, moderate (HCC) 01/29/2016   Class 1 obesity with body mass index  (BMI) of 32.0 to 32.9 in adult 01/06/2016   Migraines 11/21/2014   OSA on CPAP 01/30/2007   GERD 11/02/2006   Hypothyroidism 10/07/2006   Bipolar affective (HCC) 10/07/2006   Essential hypertension 10/07/2006   RHINITIS, CHRONIC 10/07/2006   Seasonal and perennial allergic rhinitis 10/07/2006   Moderate persistent asthma, uncomplicated 10/07/2006   Irritable bowel syndrome 10/07/2006   INTERSTITIAL CYSTITIS 10/07/2006   ROSACEA 10/07/2006   Osteoarthritis of both knees 10/07/2006    History obtained from: chart review and {Persons; PED relatives w/patient:19415::"patient"}.  Discussed the use of AI scribe software for clinical note transcription with the patient and/or guardian, who gave verbal consent to proceed.  [name] is a 68 y.o. female presenting for {Blank single:19197::"a food challenge","a drug challenge","skin testing","a sick visit","an evaluation of ***","a follow up visit"}.  She was last seen in October 2024.  At that time, her breast tree was continued at 2 puffs twice daily as well as Singulair.  Spirometry looked very good.  For her allergic rhinitis, she continue with Singulair as well as carbinoxamine, Astelin, and Pataday.  Since last visit,  Discussed the use of AI scribe software for clinical note transcription with the patient, who gave verbal consent to proceed.   Asthma/Respiratory Symptom History: ***  Allergic Rhinitis Symptom History: ***  Food Allergy Symptom History: ***  Skin Symptom History: ***  GERD Symptom History: ***  Infection Symptom History: ***  Otherwise, there have been no changes to her past medical history, surgical history, family history, or social history.    Review of systems otherwise negative other than that mentioned in the HPI.    Objective:  Last menstrual period 03/05/2012. There is no height or weight on file to calculate BMI.    Physical Exam   Diagnostic studies:    Spirometry: results normal (FEV1:  2.00/107%, FVC: 2.86/121%, FEV1/FVC: 70%).    Spirometry consistent with normal pattern. {Blank single:19197::"Albuterol/Atrovent nebulizer","Xopenex/Atrovent nebulizer","Albuterol nebulizer","Albuterol four puffs via MDI","Xopenex four puffs via MDI"} treatment given in clinic with {Blank single:19197::"significant improvement in FEV1 per ATS criteria","significant improvement in FVC per ATS criteria","significant improvement in FEV1 and FVC per ATS criteria","improvement in FEV1, but not significant per ATS criteria","improvement in FVC, but not significant per ATS criteria","improvement in FEV1 and FVC, but not significant per ATS criteria","no improvement"}.  Allergy Studies: {Blank single:19197::"none","deferred due to recent antihistamine use","deferred due to insurance stipulations that require a separate visit for testing","labs sent instead"," "}    {Blank single:19197::"Allergy testing results were read and interpreted by myself, documented by clinical staff."," "}      Malachi Bonds, MD  [facility]

## 2023-05-20 ENCOUNTER — Telehealth: Payer: Self-pay

## 2023-05-20 ENCOUNTER — Encounter: Payer: Self-pay | Admitting: Allergy & Immunology

## 2023-05-20 DIAGNOSIS — F3132 Bipolar disorder, current episode depressed, moderate: Secondary | ICD-10-CM | POA: Diagnosis not present

## 2023-05-20 NOTE — Telephone Encounter (Signed)
*  Asthma/Allergy  Pharmacy Patient Advocate Encounter   Received notification from CoverMyMeds that prior authorization for Carbinoxamine 4mg  tablets  is required/requested.   Insurance verification completed.   The patient is insured through [facility] .   Per test claim: PA required; PA started via CoverMyMeds. KEY [id number] . Waiting for clinical questions to populate.

## 2023-05-20 NOTE — Telephone Encounter (Signed)
 Additional questions populated, answered and submitted

## 2023-05-20 NOTE — Telephone Encounter (Signed)
 Pharmacy Patient Advocate Encounter  Received notification from [facility] that Prior Authorization for Carbinoxamine Maleate 4MG  tablets  has been APPROVED from 05/20/2023 to 05/19/2024

## 2023-05-26 ENCOUNTER — Other Ambulatory Visit (HOSPITAL_COMMUNITY): Payer: Self-pay

## 2023-05-26 ENCOUNTER — Telehealth: Payer: Self-pay

## 2023-05-26 NOTE — Telephone Encounter (Signed)
 Prolia VOB initiated via [email or web address]

## 2023-05-26 NOTE — Telephone Encounter (Signed)
 Per test claim, Prolia was filled through pharmacy on 03/30/23. Next fill 09/26/23.

## 2023-05-30 ENCOUNTER — Telehealth: Payer: Self-pay

## 2023-05-30 NOTE — Telephone Encounter (Unsigned)
 Copied from CRM [id number]. Topic: General - Other >> May 26, 2023  4:22 PM Alverda Joe S wrote: Reason for CRM: patient having trouble with her inspire device, battery is low and she needs help getting it open to replace the batteries.

## 2023-05-31 NOTE — Telephone Encounter (Signed)
 Spoke with Pt. to educate an ressurance on INSPIRE remote. She did not know what all the blinking lights meant. Her batteries are good and do not need to be change. Also gave her INSPIRE number that was on paperwork from her activation appt this month. NFN

## 2023-06-02 ENCOUNTER — Ambulatory Visit (INDEPENDENT_AMBULATORY_CARE_PROVIDER_SITE_OTHER)

## 2023-06-02 DIAGNOSIS — E538 Deficiency of other specified B group vitamins: Secondary | ICD-10-CM

## 2023-06-02 MED ORDER — CYANOCOBALAMIN 1000 MCG/ML IJ SOLN
1000.0000 ug | Freq: Once | INTRAMUSCULAR | Status: AC
  Administered 2023-06-02: 1000 ug via INTRAMUSCULAR

## 2023-06-02 NOTE — Progress Notes (Signed)
 Patient is in office today for a nurse visit for B12 Injection, per PCP's order. Patient Injection was given in the  Left deltoid. Patient tolerated injection well.

## 2023-06-07 ENCOUNTER — Other Ambulatory Visit: Payer: Self-pay | Admitting: Internal Medicine

## 2023-06-07 DIAGNOSIS — I10 Essential (primary) hypertension: Secondary | ICD-10-CM

## 2023-06-14 DIAGNOSIS — F3132 Bipolar disorder, current episode depressed, moderate: Secondary | ICD-10-CM | POA: Diagnosis not present

## 2023-06-16 DIAGNOSIS — G4733 Obstructive sleep apnea (adult) (pediatric): Secondary | ICD-10-CM | POA: Diagnosis not present

## 2023-06-18 ENCOUNTER — Other Ambulatory Visit: Payer: Self-pay | Admitting: Internal Medicine

## 2023-06-18 DIAGNOSIS — I493 Ventricular premature depolarization: Secondary | ICD-10-CM

## 2023-06-18 DIAGNOSIS — R000 Tachycardia, unspecified: Secondary | ICD-10-CM

## 2023-06-21 ENCOUNTER — Encounter: Payer: Self-pay | Admitting: Internal Medicine

## 2023-06-21 ENCOUNTER — Other Ambulatory Visit: Payer: Self-pay

## 2023-06-21 ENCOUNTER — Ambulatory Visit: Payer: Medicare HMO | Admitting: Internal Medicine

## 2023-06-21 ENCOUNTER — Other Ambulatory Visit: Payer: Self-pay | Admitting: Internal Medicine

## 2023-06-21 VITALS — BP 124/78 | HR 100 | Temp 98.5°F | Resp 16 | Ht 59.0 in | Wt 146.8 lb

## 2023-06-21 DIAGNOSIS — I10 Essential (primary) hypertension: Secondary | ICD-10-CM

## 2023-06-21 DIAGNOSIS — R000 Tachycardia, unspecified: Secondary | ICD-10-CM

## 2023-06-21 DIAGNOSIS — N1831 Chronic kidney disease, stage 3a: Secondary | ICD-10-CM

## 2023-06-21 DIAGNOSIS — G43109 Migraine with aura, not intractable, without status migrainosus: Secondary | ICD-10-CM | POA: Diagnosis not present

## 2023-06-21 DIAGNOSIS — E1122 Type 2 diabetes mellitus with diabetic chronic kidney disease: Secondary | ICD-10-CM

## 2023-06-21 DIAGNOSIS — I5189 Other ill-defined heart diseases: Secondary | ICD-10-CM

## 2023-06-21 LAB — BASIC METABOLIC PANEL WITH GFR
BUN: 14 mg/dL (ref 6–23)
CO2: 28 meq/L (ref 19–32)
Calcium: 10.2 mg/dL (ref 8.4–10.5)
Chloride: 100 meq/L (ref 96–112)
Creatinine, Ser: 1.02 mg/dL (ref 0.40–1.20)
GFR: 57.29 mL/min — ABNORMAL LOW (ref >60.00)
Glucose, Bld: 100 mg/dL — ABNORMAL HIGH (ref 70–99)
Potassium: 3.8 meq/L (ref 3.5–5.1)
Sodium: 139 meq/L (ref 135–145)

## 2023-06-21 LAB — CBC WITH DIFFERENTIAL/PLATELET
Basophils Absolute: 0.1 10*3/uL (ref 0.0–0.1)
Basophils Relative: 0.6 % (ref 0.0–3.0)
Eosinophils Absolute: 0.1 10*3/uL (ref 0.0–0.7)
Eosinophils Relative: 0.9 % (ref 0.0–5.0)
HCT: 44.8 % (ref 36.0–46.0)
Hemoglobin: 14.7 g/dL (ref 12.0–15.0)
Lymphocytes Relative: 23.5 % (ref 12.0–46.0)
Lymphs Abs: 2.7 10*3/uL (ref 0.7–4.0)
MCHC: 32.9 g/dL (ref 30.0–36.0)
MCV: 89.1 fl (ref 78.0–100.0)
Monocytes Absolute: 0.9 10*3/uL (ref 0.1–1.0)
Monocytes Relative: 8 % (ref 3.0–12.0)
Neutro Abs: 7.6 10*3/uL (ref 1.4–7.7)
Neutrophils Relative %: 67 % (ref 43.0–77.0)
Platelets: 238 10*3/uL (ref 150.0–400.0)
RBC: 5.02 Mil/uL (ref 3.87–5.11)
RDW: 14.4 % (ref 11.5–15.5)
WBC: 11.4 10*3/uL — ABNORMAL HIGH (ref 4.0–10.5)

## 2023-06-21 LAB — URINALYSIS, ROUTINE W REFLEX MICROSCOPIC
Bilirubin Urine: NEGATIVE
Hgb urine dipstick: NEGATIVE
Ketones, ur: NEGATIVE
Leukocytes,Ua: NEGATIVE
Nitrite: NEGATIVE
RBC / HPF: NONE SEEN (ref 0–?)
Specific Gravity, Urine: <=1.005 — AB (ref 1.000–1.030)
Total Protein, Urine: NEGATIVE
Urine Glucose: >=1000 — AB
Urobilinogen, UA: 1 (ref 0.0–1.0)
pH: 6 (ref 5.0–8.0)

## 2023-06-21 LAB — HEMOGLOBIN A1C: Hgb A1c MFr Bld: 7 % — ABNORMAL HIGH (ref 4.6–6.5)

## 2023-06-21 LAB — MICROALBUMIN / CREATININE URINE RATIO
Creatinine,U: 31.9 mg/dL
Microalb Creat Ratio: UNDETERMINED mg/g (ref 0.0–30.0)
Microalb, Ur: <0.7 mg/dL

## 2023-06-21 NOTE — Progress Notes (Unsigned)
 Subjective:  Patient ID: [name], female    DOB: 1955/11/12  Age: 68 y.o. MRN: [id number]  CC: Medical Management of Chronic Issues (3 month follow up. ), Hyperlipidemia, Hypothyroidism, and Diabetes   HPI [name] presents for f/up ---  Discussed the use of AI scribe software for clinical note transcription with the patient, who gave verbal consent to proceed.  History of Present Illness   [name] is a 68 year old female with bipolar disorder who presents with feelings of being overwhelmed and concerns about hoarding tendencies.  She feels overwhelmed, attributing this to her bipolar disorder and hoarding tendencies. Her living space is cluttered, preventing her from [employer], except for a few who are aware of her situation. She seeks assistance in organizing her home but is unsure whom to approach for help.  She underwent knee surgery in the past, which did not stabilize properly, necessitating a future procedure to place mesh for stabilization. She experiences discomfort when sleeping on her side, describing it as feeling like 'it opens a door in my side, in my chest.' The surgical scar remains tender.  No current use of prednisone  or hydrocodone . She reports a slight weight loss of about three to five pounds and a decreased appetite, limiting her grocery purchases. She experienced nausea on one occasion, which resolved without medication. She previously used Phenergan  for nausea, which she found effective, but currently does not have any on hand.  She feels weak but has no dizziness or lightheadedness. She is unable to feel her heart beating and thus cannot comment on whether her heart is racing.       Outpatient Medications Prior to Visit  Medication Sig Dispense Refill   albuterol  (VENTOLIN  HFA) 108 (90 Base) MCG/ACT inhaler INHALE 1-2 PUFFS INTO THE LUNGS DAILY AS NEEDED FOR WHEEZING OR SHORTNESS OF BREATH. 18 each 1   Azelastine  HCl 137 MCG/SPRAY SOLN  Place 2 sprays into the nose 2 (two) times daily. 90 mL 3   Carbinoxamine  Maleate 4 MG TABS Take 1 tablet (4 mg total) by mouth every 6 (six) hours. 360 tablet 1   cariprazine (VRAYLAR) 3 MG capsule 4.5 mg.     clonazePAM  (KLONOPIN ) 1 MG tablet Take 1 mg by mouth daily as needed.     dicyclomine  (BENTYL ) 10 MG capsule TAKE 1 CAPSULE (10 MG TOTAL) BY MOUTH 3 (THREE) TIMES DAILY BEFORE MEALS. 270 capsule 1   DULoxetine (CYMBALTA) 60 MG capsule Take 60 mg by mouth 2 (two) times daily at 10 AM and 5 PM.     HYDROcodone -acetaminophen  (NORCO/VICODIN) 5-325 MG tablet Take 1-2 tablets by mouth every 8 (eight) hours as needed for moderate pain (pain score 4-6). 20 tablet 0   levothyroxine  (SYNTHROID ) 25 MCG tablet TAKE 1 TABLET BY MOUTH EVERY DAY 90 tablet 0   montelukast  (SINGULAIR ) 10 MG tablet Take 1 tablet (10 mg total) by mouth at bedtime. 90 tablet 3   MYRBETRIQ 50 MG TB24 tablet Take 50 mg by mouth daily.     NURTEC 75 MG TBDP TAKE 1 TABLET BY MOUTH EVERY OTHER DAY 40 tablet 2   predniSONE  (DELTASONE ) 10 MG tablet Take one tablet (10mg ) twice daily for six days days, then one tablet (10mg ) once daily for six days, then STOP. 18 tablet 0   rosuvastatin  (CRESTOR ) 20 MG tablet TAKE 1 TABLET BY MOUTH EVERY DAY 90 tablet 0   tiZANidine  (ZANAFLEX ) 2 MG tablet Take 1 tablet (2 mg total)  by mouth 2 (two) times daily as needed for muscle spasms. 60 tablet 1   trihexyphenidyl  (ARTANE ) 2 MG tablet TAKE 1 TABLET BY MOUTH EVERY DAY 90 tablet 0   zinc  gluconate 50 MG tablet Take 50 mg by mouth daily.     empagliflozin  (JARDIANCE ) 10 MG TABS tablet Take 1 tablet (10 mg total) by mouth daily before breakfast. 90 tablet 1   metoprolol  succinate (TOPROL -XL) 25 MG 24 hr tablet Take 1 tablet (25 mg total) by mouth daily. 90 tablet 0   spironolactone  (ALDACTONE ) 25 MG tablet TAKE 1 TABLET (25 MG TOTAL) BY MOUTH DAILY. 90 tablet 0   Facility-Administered Medications Prior to Visit  Medication Dose Route Frequency  Provider Last Rate Last Admin   denosumab  (PROLIA ) injection 60 mg  60 mg Subcutaneous Once Arcadio Knuckles, MD        ROS Review of Systems  Objective:  BP 124/78 (BP Location: Left Arm, Patient Position: Sitting, Cuff Size: Normal)   Pulse 100   Temp 98.5 F (36.9 C) (Oral)   Resp 16   Ht 4\' 11"  (1.499 m)   Wt 146 lb 12.8 oz (66.6 kg)   LMP 03/05/2012 (Approximate)   SpO2 95%   BMI 29.65 kg/m   BP Readings from Last 3 Encounters:  06/21/23 124/78  05/19/23 124/80  05/18/23 124/74    Wt Readings from Last 3 Encounters:  06/21/23 146 lb 12.8 oz (66.6 kg)  05/19/23 147 lb (66.7 kg)  05/18/23 153 lb 9.6 oz (69.7 kg)    Physical Exam Vitals reviewed.  Constitutional:      Appearance: Normal appearance.  HENT:     Mouth/Throat:     Mouth: Mucous membranes are moist.  Eyes:     General: No scleral icterus.    Conjunctiva/sclera: Conjunctivae normal.  Cardiovascular:     Rate and Rhythm: Normal rate and regular rhythm.     Heart sounds: No murmur heard.    No friction rub. No gallop.  Pulmonary:     Effort: Pulmonary effort is normal.     Breath sounds: No stridor. No wheezing, rhonchi or rales.  Chest:     Comments: EKG--- NSR, 94 bpm No LVH, Q waves, or ST/T wave changes  unchanged Abdominal:     General: Abdomen is flat.     Palpations: There is no mass.     Tenderness: There is no abdominal tenderness. There is no guarding.     Hernia: No hernia is present.  Musculoskeletal:        General: Normal range of motion.     Cervical back: Neck supple.     Right lower leg: No edema.     Left lower leg: No edema.  Lymphadenopathy:     Cervical: No cervical adenopathy.  Skin:    General: Skin is warm and dry.     Coloration: Skin is not pale.  Neurological:     General: No focal deficit present.     Mental Status: She is alert and oriented to person, place, and time. Mental status is at baseline.  Psychiatric:        Mood and Affect: Mood normal.         Behavior: Behavior normal.        Thought Content: Thought content normal.        Judgment: Judgment normal.     Lab Results  Component Value Date   WBC 11.4 (H) 06/21/2023   HGB 14.7 06/21/2023  HCT 44.8 06/21/2023   PLT 238.0 06/21/2023   GLUCOSE 100 (H) 06/21/2023   CHOL 159 03/24/2023   TRIG 95.0 03/24/2023   HDL 73.60 03/24/2023   LDLDIRECT 143.0 09/14/2016   LDLCALC 66 03/24/2023   ALT 17 03/24/2023   AST 19 03/24/2023   NA 139 06/21/2023   K 3.8 06/21/2023   CL 100 06/21/2023   CREATININE 1.02 06/21/2023   BUN 14 06/21/2023   CO2 28 06/21/2023   TSH 0.36 03/24/2023   INR 0.94 12/21/2017   HGBA1C 7.0 (H) 06/21/2023   MICROALBUR <0.7 06/21/2023    DG Neck Soft Tissue Result Date: 03/09/2023 CLINICAL DATA:  Obstructive sleep apnea EXAM: NECK SOFT TISSUES - 1+ VIEW COMPARISON:  None Available. FINDINGS: Suboptimal study due to overlying shoulders. Lower cervical airway not visualized. No evidence of epiglottic thickening or retropharyngeal soft tissue abnormality. No radiopaque foreign body. IMPRESSION: No visible acute abnormality. Electronically Signed   By: Janeece Mechanic M.D.   On: 03/09/2023 10:39   DG Chest 1 View Result Date: 03/09/2023 CLINICAL DATA:  Obstructive sleep apnea. EXAM: CHEST  1 VIEW COMPARISON:  Chest radiograph dated 01/16/2018. FINDINGS: No focal consolidation, pleural effusion, or pneumothorax. Left lung base atelectasis/scarring. The cardiac silhouette is within normal limits. Similar device over the right chest. No acute osseous pathology. IMPRESSION: No active disease. Electronically Signed   By: Angus Bark M.D.   On: 03/09/2023 10:39    Assessment & Plan:  Tachycardia -     EKG 12-Lead  Essential hypertension -     CBC with Differential/Platelet; Future -     Basic metabolic panel with GFR; Future  Type 2 diabetes mellitus with stage 3a chronic kidney disease, without long-term current use of insulin (HCC) -     CBC with  Differential/Platelet; Future -     Hemoglobin A1c; Future -     Microalbumin / creatinine urine ratio; Future -     Urinalysis, Routine w reflex microscopic; Future -     Basic metabolic panel with GFR; Future     Follow-up: Return in about 6 months (around 12/22/2023).  Sandra Crouch, MD

## 2023-06-21 NOTE — Patient Instructions (Signed)

## 2023-06-22 ENCOUNTER — Ambulatory Visit: Payer: Self-pay | Admitting: Internal Medicine

## 2023-06-22 MED ORDER — PROMETHAZINE HCL 12.5 MG PO TABS: 12.5000 mg | ORAL_TABLET | Freq: Four times a day (QID) | ORAL | 0 refills | Status: DC | PRN

## 2023-06-22 NOTE — Telephone Encounter (Signed)
 Admin Fee: $61

## 2023-06-22 NOTE — Telephone Encounter (Signed)
 I would like clarity on admin fee for this medication. Patient medication is here we can administered but need Admin fee amount.

## 2023-06-28 ENCOUNTER — Telehealth: Payer: Self-pay | Admitting: Internal Medicine

## 2023-06-28 NOTE — Telephone Encounter (Signed)
 Copied from CRM [id number]. Topic: Clinical - Lab/Test Results >> Jun 28, 2023 12:42 PM [name] wrote: Reason for CRM: Pt would like to know lab results

## 2023-06-28 NOTE — Telephone Encounter (Signed)
 Can you look at her labs and make comments on them so that I can reach back out to her?

## 2023-06-28 NOTE — Telephone Encounter (Signed)
 Called pt and added Prolia  orders to her appointment on 5.28

## 2023-06-28 NOTE — Telephone Encounter (Signed)
 Copied from CRM [id number]. Topic: Appointments - Scheduling Inquiry for Clinic >> Jun 28, 2023 12:38 PM Clyde Darling P wrote: Reason for CRM: Pt receive a call to schedule for "colier injection" want to know if she could take it the day she take her b12 injection 05/28- pt can be reached [phone] if pt is unable to be reach leave response in chart, she will call back when she is free.

## 2023-06-29 NOTE — Telephone Encounter (Signed)
 Patient has been made aware of her lab results and gave a verbal understanding.

## 2023-07-06 ENCOUNTER — Ambulatory Visit (INDEPENDENT_AMBULATORY_CARE_PROVIDER_SITE_OTHER)

## 2023-07-06 DIAGNOSIS — M810 Age-related osteoporosis without current pathological fracture: Secondary | ICD-10-CM

## 2023-07-06 DIAGNOSIS — E538 Deficiency of other specified B group vitamins: Secondary | ICD-10-CM

## 2023-07-06 MED ORDER — CYANOCOBALAMIN 1000 MCG/ML IJ SOLN
1000.0000 ug | Freq: Once | INTRAMUSCULAR | Status: AC
  Administered 2023-07-06: 1000 ug via INTRAMUSCULAR

## 2023-07-06 MED ORDER — DENOSUMAB 60 MG/ML ~~LOC~~ SOSY: 60.0000 mg | PREFILLED_SYRINGE | SUBCUTANEOUS | Status: DC

## 2023-07-06 NOTE — Progress Notes (Signed)
 After obtaining consent, and per orders of Dr. Rochelle Chu, injection of B12 and Prolia  given by Bretta Camp. Patient instructed to remain in clinic for 20 minutes afterwards, and to report any adverse reaction to me immediately.

## 2023-07-11 ENCOUNTER — Ambulatory Visit (INDEPENDENT_AMBULATORY_CARE_PROVIDER_SITE_OTHER): Admitting: Primary Care

## 2023-07-11 ENCOUNTER — Encounter: Payer: Self-pay | Admitting: Primary Care

## 2023-07-11 VITALS — BP 126/74 | HR 107 | Temp 97.7°F | Ht 59.0 in | Wt 142.4 lb

## 2023-07-11 DIAGNOSIS — G4733 Obstructive sleep apnea (adult) (pediatric): Secondary | ICD-10-CM | POA: Diagnosis not present

## 2023-07-11 NOTE — Patient Instructions (Addendum)
  VISIT SUMMARY: Today, we discussed the management of your Inspire device for obstructive sleep apnea. You reported improved sleep quality but mentioned frequent awakenings and occasional pauses of the device at night. We reviewed your current usage and made some adjustments to optimize your treatment.  YOUR PLAN: -OBSTRUCTIVE SLEEP APNEA: Obstructive sleep apnea is a condition where the airway becomes blocked during sleep, causing breathing pauses. You are managing this with the Inspire device, which has improved your sleep quality. We recommend extending the device activation duration to nine hours and increasing the pause time to thirty minutes. You should pause the device when eating or drinking and continue using it during naps. Increase the device level to seven on Wednesday. We have scheduled a procedure on July 1st to secure the device, as it occasionally moves when you sleep on your side. Additionally, we will run a waveform for three minutes to assess the device function.  INSTRUCTIONS: Please follow the recommendations provided: extend the device activation duration to nine hours, increase the pause time to thirty minutes, and increase the device level to seven on Wednesday. Remember to pause the device when eating or drinking and continue using it during naps. Your procedure to secure the device is scheduled for July 1st. We will also run a waveform for three minutes to assess the device function.  Follow-up 4 week Inspire readiness check before titration study

## 2023-07-11 NOTE — Progress Notes (Signed)
 @Patient  ID: [name], female    DOB: [birth year], 67 y.o.   MRN: [id number]  Chief Complaint  Patient presents with   Follow-up    Inspire f/u    Referring provider: Arcadio Knuckles, MD  HPI: 68 year old female. Patient with moderate obstructive sleep apnea, intolerant of CPAP. Patient of Dr. Gaynell Keeler.    Previous [facility] pulmonary encounter: 05/18/23- Dr. Gaynell Keeler    In for inspire device activation today   Had inspire device implanted 03/09/2023 Sleep study with moderate obstructive sleep apnea   Implantation went well Has been doing well   Some difficulty initiating sleep, some difficulty maintaining sleep Usually able to fall asleep after about 30 minutes to an hour Will wake up a few times during the night sometimes gets up to get herself going on sometimes takes her to better tolerate   Denies any morning headaches No significant memory issues   Health has been relatively good   07/11/2023-Interim hx  Discussed the use of AI scribe software for clinical note transcription with the patient, who gave verbal consent to proceed.  History of Present Illness   AMEKA Whitney is a 68 year old female who presents for follow-up regarding her Inspire device.  She had her Inspire device activated in April 2025 and has experienced an improvement in sleep quality. However, she wakes up more than once a night and sometimes pauses the device when she gets up to use the bathroom or eat a small snack. Occasionally, she underestimates the time she will be awake and finds the device off when she wakes up in the morning.  She is currently using the remote to manage the device and is not utilizing the app on her phone. She has been using the device consistently, averaging over eight hours of use per night. She has been increasing the device level weekly, although she experienced difficulty swallowing at level five, which led her to remain at that level for three weeks. She is currently at  level six (1.3V) and uses the device 100% of the time during sleep.  The device occasionally moves if she sleeps on her side, causing it to protrude, and she has to reposition it. No pain or speech impediment when the device is off, but when it is on, it can affect her speech, making her sound impaired if she talks while it is active. Schedule procedure on July 1st with Dr. Tellis Feathers to secure the device.  She uses a sound machine with a setting called 'peace' to help her fall asleep, typically taking about sixty minutes to do so. She sometimes sleeps longer than nine hours but usually has to get up for daily activities. She also uses the device during naps.      Sensation voltage of 0.6 Functional voltage of 0.8 Therapy duration of 8 hours Activation delay of 1 hour Pause of 15 minutes   Allergies  Allergen Reactions   Emetrol Itching   Indomethacin Other (See Comments)    Muscle spasms Causes muscle spasms in neck   Iodinated Contrast Media Itching    Flushed and Fever, itch all over   Amlodipine  Swelling    Peripheral edema   Carbamazepine  Other (See Comments)    Easy and unusual bleeding    Pseudoephedrine Other (See Comments)    I fly off the walls  CANNOT SLEEP   Risperidone And Related Other (See Comments)    Stomach upset, insomnia, drooling, tremors, "jerks," sensitivity to touch.  Bupivacaine Hives   Pentazocine Other (See Comments)    Unknown reaction MADE ME "LACTATE"   Propoxyphene Itching and Nausea And Vomiting    darvocet   Sulfa Antibiotics Other (See Comments)    Unknown reaction   Tramadol  Other (See Comments)    Patient can not remember   Aspirin Other (See Comments)    upset stomach, ringing in the ears   Codeine Itching and Other (See Comments)    Anything related to codeine   Etodolac Rash and Other (See Comments)   Propranolol Nausea Only and Other (See Comments)    Immunization History  Administered Date(s) Administered   Fluad Quad(high Dose  65+) 10/29/2021   Fluad Trivalent(High Dose 65+) 10/08/2022   Influenza Split 02/09/2012   Influenza Whole 12/08/2006, 12/01/2007   Influenza,inj,Quad PF,6+ Mos 12/11/2013, 11/21/2014, 10/13/2016, 10/24/2017, 10/31/2018, 11/08/2019   Influenza-Unspecified 12/01/2015, 10/21/2020   Moderna Covid-19 Vaccine Bivalent Booster 5yrs & up 12/29/2020   Moderna Sars-Covid-2 Vaccination 04/28/2019, 05/29/2019, 01/26/2020   PNEUMOCOCCAL CONJUGATE-20 03/09/2021   Pfizer(Comirnaty)Fall Seasonal Vaccine 12 years and older 11/04/2022   Pneumococcal Polysaccharide-23 12/12/2013   Td 10/31/2015   Tdap 04/16/2011, 06/25/2013, 08/25/2020   Zoster Recombinant(Shingrix) 05/30/2018, 08/16/2018    Past Medical History:  Diagnosis Date   Allergic rhinitis    Anxiety    Benign essential tremor    hands   Bipolar 1 disorder, mixed, moderate (HCC)    Claustrophobia    Depression    Eczema    Frequency of urination    GERD (gastroesophageal reflux disease)    History of frequent urinary tract infections    History of gastroesophageal reflux (GERD)    05-25-2017  per pt no issues since hiatal hernia repair 01/ 2018   History of hiatal hernia    History of melanoma excision    early 2000s-- BACK   History of panic attacks    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   Hypothyroidism    Intermittent palpitations    cardiology--  dr [place]   Interstitial cystitis    Limited jaw range of motion    s/p  bilateral TMJ surgery,  age 52s   Migraines    Moderate persistent asthma    pulmologist-- dr Matilde Son   OA (osteoarthritis)    both knees   OSA on    per last sleep study 09/ 2017  mild OSA, AHI13/hr   PONV (postoperative nausea and vomiting)    PVC (premature ventricular contraction)    Rosacea    Sensation of pressure in bladder area    per pt intermittant   TMJ arthralgia    Urticaria    Wears glasses    White coat syndrome without hypertension    05-25-2017  per pt hx  hypertension yrs ago, no issues after quitting stressful job    Tobacco History: Social History   Tobacco Use  Smoking Status Never   Passive exposure: Never  Smokeless Tobacco Never   Counseling given: Not Answered   Outpatient Medications Prior to Visit  Medication Sig Dispense Refill   albuterol  (VENTOLIN  HFA) 108 (90 Base) MCG/ACT inhaler INHALE 1-2 PUFFS INTO THE LUNGS DAILY AS NEEDED FOR WHEEZING OR SHORTNESS OF BREATH. 18 each 1   Azelastine  HCl 137 MCG/SPRAY SOLN Place 2 sprays into the nose 2 (two) times daily. 90 mL 3   Carbinoxamine  Maleate 4 MG TABS Take 1 tablet (4 mg total) by mouth every 6 (six) hours. 360 tablet 1  cariprazine (VRAYLAR) 3 MG capsule 4.5 mg.     clonazePAM  (KLONOPIN ) 1 MG tablet Take 1 mg by mouth daily as needed.     dicyclomine  (BENTYL ) 10 MG capsule TAKE 1 CAPSULE (10 MG TOTAL) BY MOUTH 3 (THREE) TIMES DAILY BEFORE MEALS. 270 capsule 1   DULoxetine (CYMBALTA) 60 MG capsule Take 60 mg by mouth 2 (two) times daily at 10 AM and 5 PM.     JARDIANCE  10 MG TABS tablet TAKE 1 TABLET BY MOUTH DAILY BEFORE BREAKFAST. 90 tablet 1   levothyroxine  (SYNTHROID ) 25 MCG tablet TAKE 1 TABLET BY MOUTH EVERY DAY 90 tablet 0   metoprolol  succinate (TOPROL -XL) 25 MG 24 hr tablet TAKE 1 TABLET (25 MG TOTAL) BY MOUTH DAILY. 90 tablet 0   montelukast  (SINGULAIR ) 10 MG tablet Take 1 tablet (10 mg total) by mouth at bedtime. 90 tablet 3   MYRBETRIQ 50 MG TB24 tablet Take 50 mg by mouth daily.     NURTEC 75 MG TBDP TAKE 1 TABLET BY MOUTH EVERY OTHER DAY 40 tablet 2   promethazine  (PHENERGAN ) 12.5 MG tablet Take 1 tablet (12.5 mg total) by mouth every 6 (six) hours as needed for nausea or vomiting. 30 tablet 0   rosuvastatin  (CRESTOR ) 20 MG tablet TAKE 1 TABLET BY MOUTH EVERY DAY 90 tablet 0   spironolactone  (ALDACTONE ) 25 MG tablet TAKE 1 TABLET (25 MG TOTAL) BY MOUTH DAILY. 90 tablet 0   tiZANidine  (ZANAFLEX ) 2 MG tablet Take 1 tablet (2 mg total) by mouth 2 (two) times  daily as needed for muscle spasms. 60 tablet 1   trihexyphenidyl  (ARTANE ) 2 MG tablet TAKE 1 TABLET BY MOUTH EVERY DAY 90 tablet 0   zinc  gluconate 50 MG tablet Take 50 mg by mouth daily.     HYDROcodone -acetaminophen  (NORCO/VICODIN) 5-325 MG tablet Take 1-2 tablets by mouth every 8 (eight) hours as needed for moderate pain (pain score 4-6). (Patient not taking: Reported on 07/11/2023) 20 tablet 0   predniSONE  (DELTASONE ) 10 MG tablet Take one tablet (10mg ) twice daily for six days days, then one tablet (10mg ) once daily for six days, then STOP. 18 tablet 0   Facility-Administered Medications Prior to Visit  Medication Dose Route Frequency Provider Last Rate Last Admin   denosumab  (PROLIA ) injection 60 mg  60 mg Subcutaneous Once Arcadio Knuckles, MD       [START ON 07/20/2023] denosumab  (PROLIA ) injection 60 mg  60 mg Subcutaneous Q6 months Arcadio Knuckles, MD          Review of Systems  Review of Systems   Physical Exam  BP 126/74 (BP Location: Left Arm, Patient Position: Sitting, Cuff Size: Normal)   Pulse (!) 107   Temp 97.7 F (36.5 C) (Temporal)   Ht 4\' 11"  (1.499 m)   Wt 142 lb 6.4 oz (64.6 kg)   LMP 03/05/2012 (Approximate)   SpO2 98%   BMI 28.76 kg/m  Physical Exam Constitutional:      Appearance: Normal appearance.  HENT:     Head: Normocephalic and atraumatic.     Mouth/Throat:     Comments: Tongue midline; no significant deviation with protrusion  Musculoskeletal:     Comments: Inspire device palpable right chest wall   Skin:    General: Skin is warm and dry.  Neurological:     General: No focal deficit present.     Mental Status: She is alert and oriented to person, place, and time. Mental status is at baseline.  Psychiatric:        Mood and Affect: Mood normal.        Behavior: Behavior normal.        Thought Content: Thought content normal.        Judgment: Judgment normal.      Lab Results:  CBC    Component Value Date/Time   WBC 11.4 (H)  06/21/2023 1358   RBC 5.02 06/21/2023 1358   HGB 14.7 06/21/2023 1358   HCT 44.8 06/21/2023 1358   PLT 238.0 06/21/2023 1358   MCV 89.1 06/21/2023 1358   MCH 28.3 10/08/2022 1625   MCHC 32.9 06/21/2023 1358   RDW 14.4 06/21/2023 1358   LYMPHSABS 2.7 06/21/2023 1358   MONOABS 0.9 06/21/2023 1358   EOSABS 0.1 06/21/2023 1358   BASOSABS 0.1 06/21/2023 1358    BMET    Component Value Date/Time   NA 139 06/21/2023 1358   K 3.8 06/21/2023 1358   CL 100 06/21/2023 1358   CO2 28 06/21/2023 1358   GLUCOSE 100 (H) 06/21/2023 1358   BUN 14 06/21/2023 1358   CREATININE 1.02 06/21/2023 1358   CREATININE 1.14 (H) 10/08/2022 1625   CALCIUM  10.2 06/21/2023 1358   GFRNONAA >60 03/08/2023 1257   GFRNONAA 62 11/21/2014 0959   GFRAA >60 01/23/2018 0550   GFRAA 72 11/21/2014 0959    BNP No results found for: "BNP"  ProBNP    Component Value Date/Time   PROBNP 46.0 03/12/2020 1516    Imaging: No results found.   Assessment & Plan:   1. OSA (obstructive sleep apnea) (Primary)   Assessment and Plan    Obstructive Sleep Apnea Obstructive sleep apnea managed with Inspire device. Patient is using Inspire device 100% >4 hours over the last 30 days. Average usage 8 hours 51 min. Average pauses per night 0.5. Reports improved sleep quality but experiences frequent awakenings and occasionally pauses the device at night. No pain or discomfort, but device moves when sleeping on one side. Scheduled for a procedure on July 1st to secure the device. No swallowing issues when the device is off. Occasionally experiences speech changes when the device is active. Current level is 6 (1.3V), with plans to increase to level 7 on Wednesday. No significant tongue deviation during examination. - Run a waveform for three minutes to assess device function. - Extend sleep duration to 9 hours and increase pause time to 30 minutes. - Schedule procedure on July 1st with Dr. Tellis Feathers to secure the device. - Advise  to pause the device when eating or drinking. - Instruct to use the device during naps. - Increase device level to seven on Wednesday.  Sensation voltage of 0.6 Functional voltage of 0.8 Therapy duration of 9 hours Activation delay of 1 hour Pause of 30 minutes  Recording duration: 17 minutes     Antonio Baumgarten, NP 07/11/2023

## 2023-07-12 ENCOUNTER — Encounter: Payer: Self-pay | Admitting: Primary Care

## 2023-07-13 IMAGING — CT CT MAXILLOFACIAL W/O CM
3 of 4 series · 16 of 47 positions shown, 19 images · non-contrast
Comparison: None.

CLINICAL DATA: Old down by animal.  Facial injury.



[Series 3: max soft · axial · 0.36mm/px · z∈[-229,-95]mm · 10 of 79 slices shown, 13 images]
[im 6/79  brain]
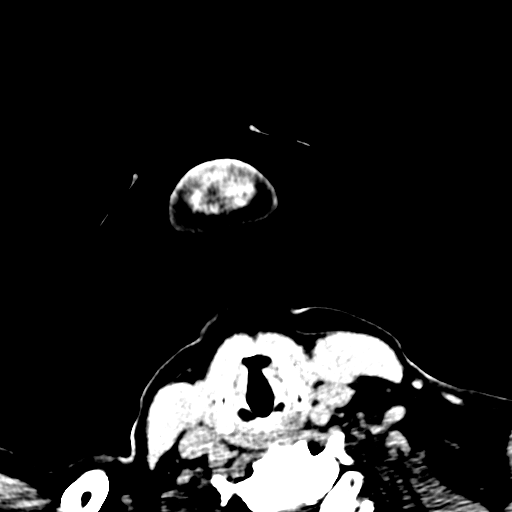
[im 6/79  bone]
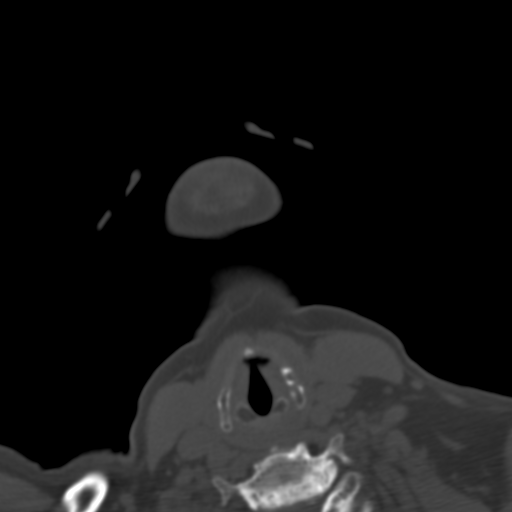
[im 14/79  bone]
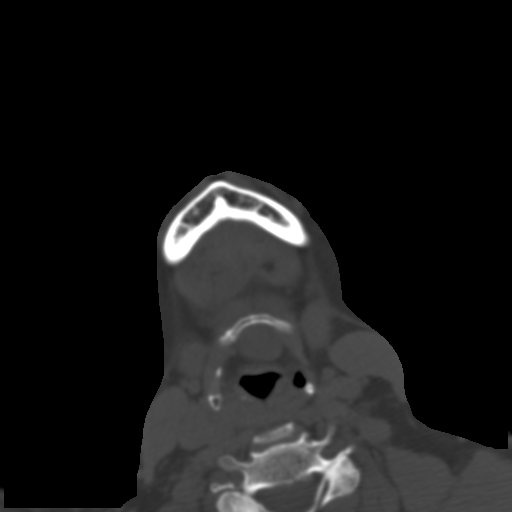
[im 22/79  bone]
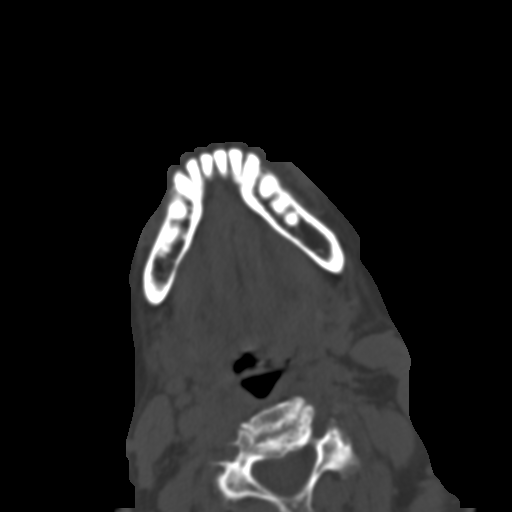
[im 27/79  bone]
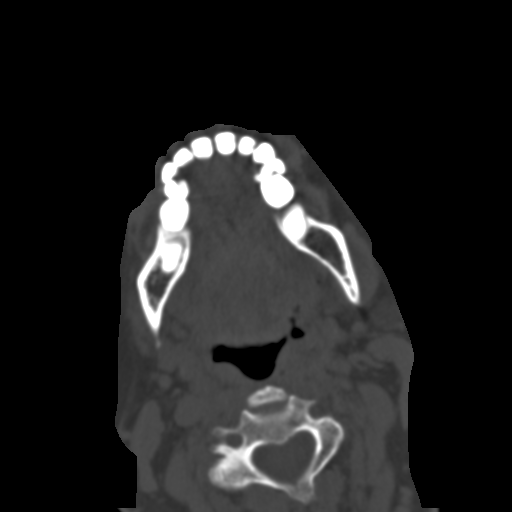
[im 35/79  brain]
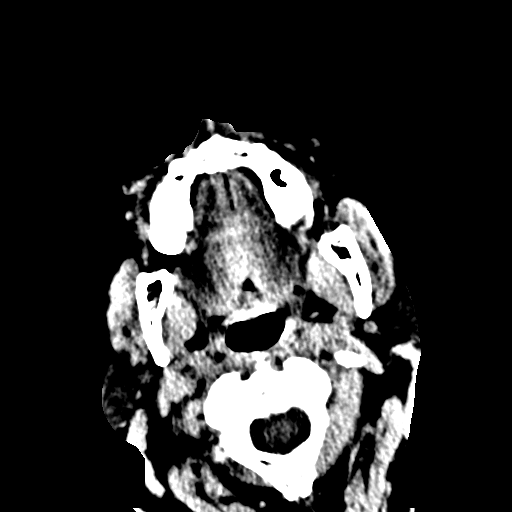
[im 35/79  bone]
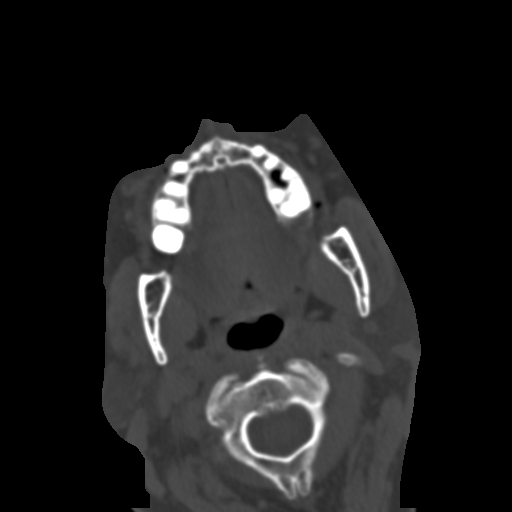
[im 44/79  bone]
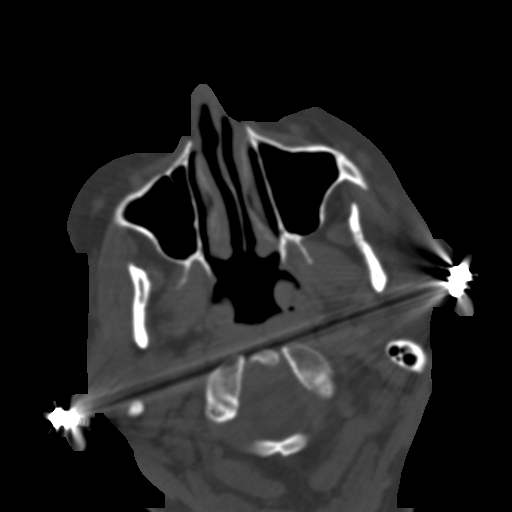
[im 52/79  bone]
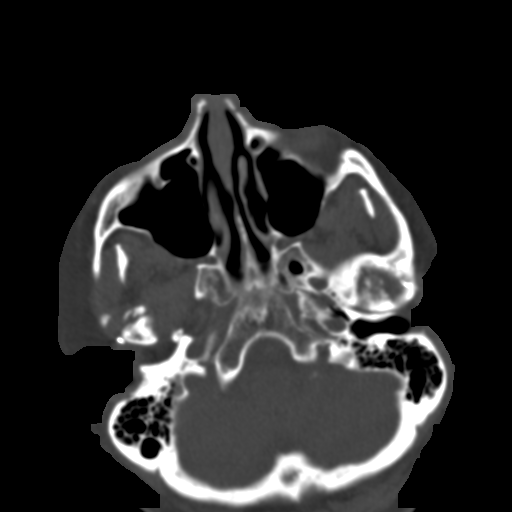
[im 60/79  bone]
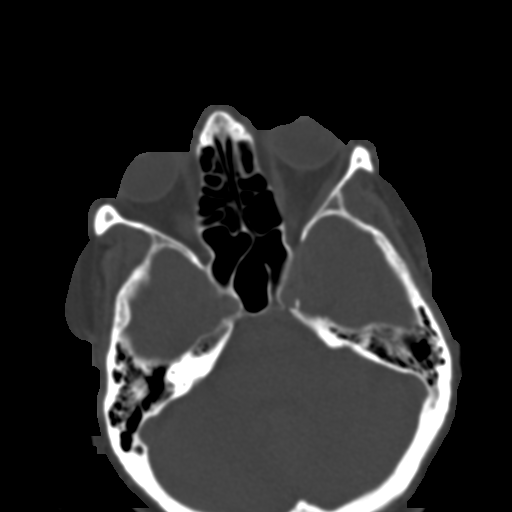
[im 65/79  brain]
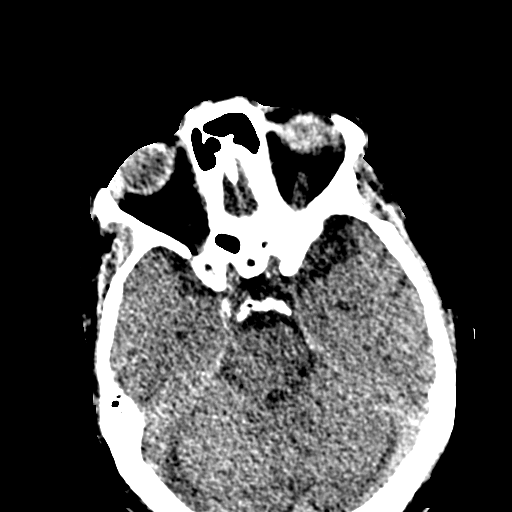
[im 65/79  bone]
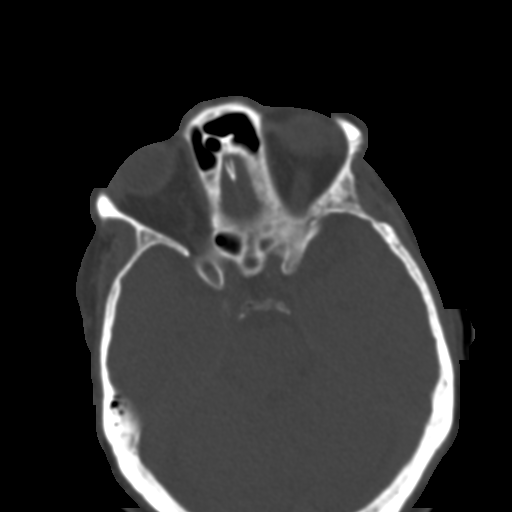
[im 73/79  bone]
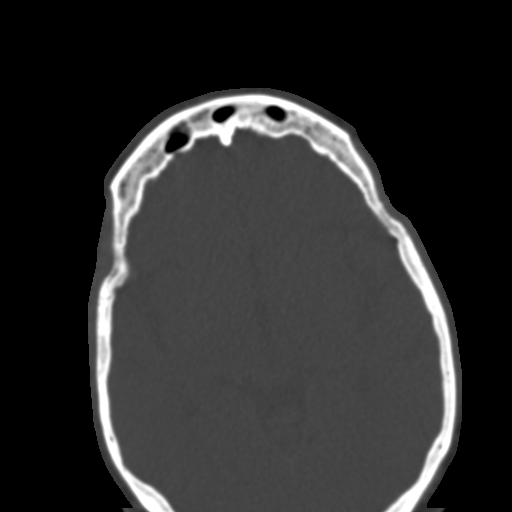

[Series 8: sagittal soft · sagittal · 0.32mm/px · 3 of 77 slices shown]
[im 26/77  bone]
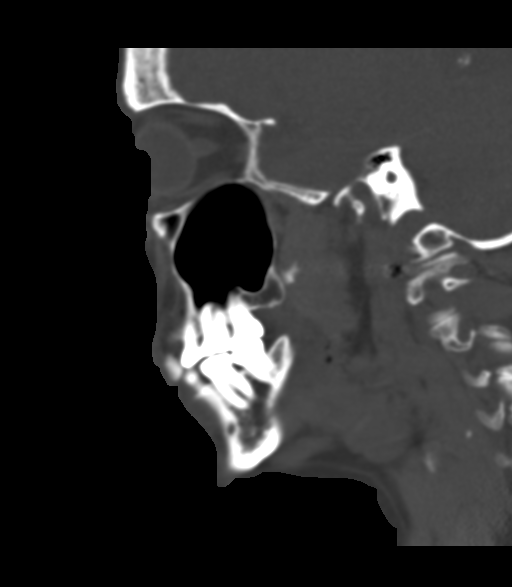
[im 39/77  bone]
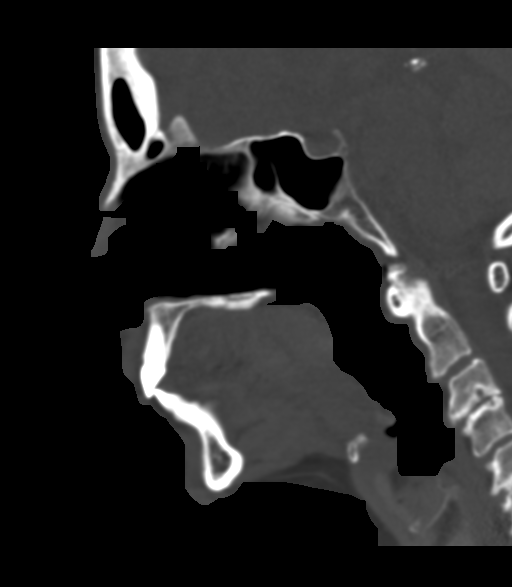
[im 51/77  bone]
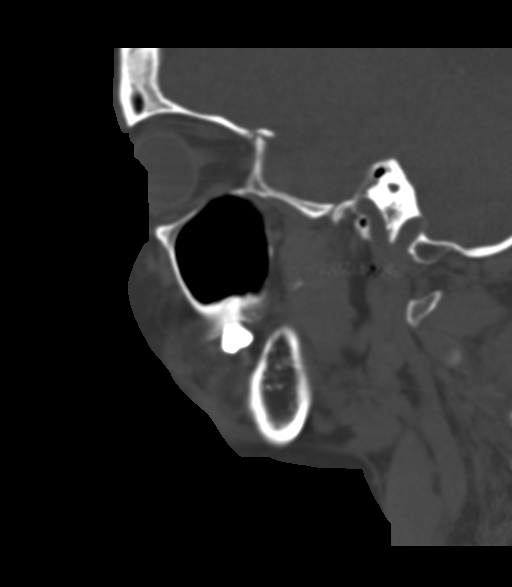

[Series 9: coronal bone · coronal · 0.38mm/px · 3 of 75 slices shown]
[im 19/75  bone]
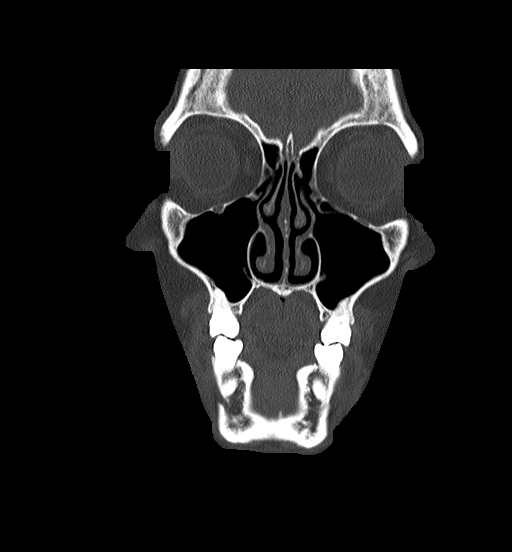
[im 38/75  bone]
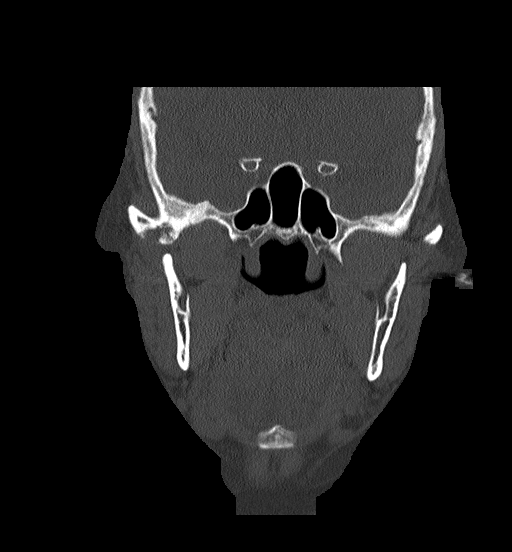
[im 56/75  bone]
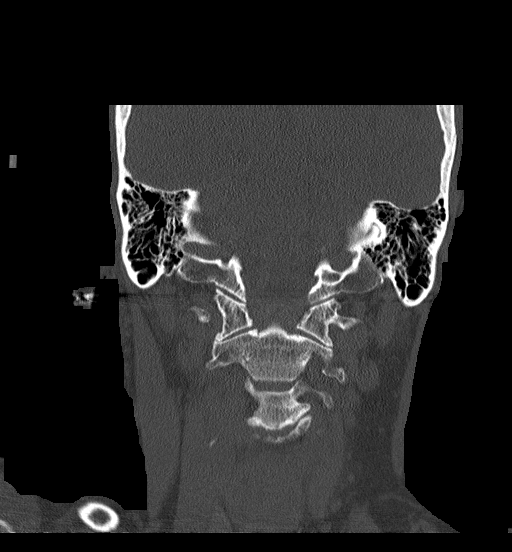

[16 of 47 positions shown; findings below may reference images not displayed]

FINDINGS: Osseous: Minor acute nasal fractures. No other acute facial
fracture. There is advanced degenerative disease of the
temporomandibular joint on the right with evidence of prior surgery
in that region. Advanced arthropathy of the left temporomandibular
joint as well.

Orbits: No evidence of orbital injury.

Sinuses: Clear

Soft tissues: Soft tissue swelling of the nose.

Limited intracranial: Negative
IMPRESSION: Minor acute nasal fractures.

Chronic arthropathy of the temporomandibular joints, without
evidence of previous surgery on the right.

## 2023-07-15 DIAGNOSIS — F3132 Bipolar disorder, current episode depressed, moderate: Secondary | ICD-10-CM | POA: Diagnosis not present

## 2023-07-25 ENCOUNTER — Ambulatory Visit
Admission: RE | Admit: 2023-07-25 | Discharge: 2023-07-25 | Disposition: A | Discharge status: Home / Self Care | Source: Ambulatory Visit | Attending: Family Medicine | Admitting: Family Medicine

## 2023-07-25 DIAGNOSIS — Z1231 Encounter for screening mammogram for malignant neoplasm of breast: Secondary | ICD-10-CM

## 2023-07-26 DIAGNOSIS — F3132 Bipolar disorder, current episode depressed, moderate: Secondary | ICD-10-CM | POA: Diagnosis not present

## 2023-07-26 DIAGNOSIS — F410 Panic disorder [episodic paroxysmal anxiety] without agoraphobia: Secondary | ICD-10-CM | POA: Diagnosis not present

## 2023-07-26 DIAGNOSIS — F411 Generalized anxiety disorder: Secondary | ICD-10-CM | POA: Diagnosis not present

## 2023-07-27 ENCOUNTER — Other Ambulatory Visit: Payer: Self-pay | Admitting: Orthopaedic Surgery

## 2023-08-03 ENCOUNTER — Encounter (HOSPITAL_BASED_OUTPATIENT_CLINIC_OR_DEPARTMENT_OTHER): Payer: Self-pay | Admitting: Otolaryngology

## 2023-08-03 ENCOUNTER — Other Ambulatory Visit: Payer: Self-pay

## 2023-08-05 ENCOUNTER — Encounter (HOSPITAL_BASED_OUTPATIENT_CLINIC_OR_DEPARTMENT_OTHER)
Admission: RE | Admit: 2023-08-05 | Discharge: 2023-08-05 | Disposition: A | Discharge status: Home / Self Care | Source: Ambulatory Visit | Attending: Otolaryngology | Admitting: Otolaryngology

## 2023-08-05 DIAGNOSIS — Z01818 Encounter for other preprocedural examination: Secondary | ICD-10-CM | POA: Insufficient documentation

## 2023-08-05 LAB — BASIC METABOLIC PANEL WITH GFR
Anion gap: 11 (ref 5–15)
BUN: 12 mg/dL (ref 8–23)
CO2: 22 mmol/L (ref 22–32)
Calcium: 9.6 mg/dL (ref 8.9–10.3)
Chloride: 105 mmol/L (ref 98–111)
Creatinine, Ser: 1 mg/dL (ref 0.44–1.00)
GFR, Estimated: >60 mL/min (ref >60)
Glucose, Bld: 122 mg/dL — ABNORMAL HIGH (ref 70–99)
Potassium: 4.2 mmol/L (ref 3.5–5.1)
Sodium: 138 mmol/L (ref 135–145)

## 2023-08-08 ENCOUNTER — Other Ambulatory Visit: Payer: Self-pay | Admitting: Otolaryngology

## 2023-08-08 ENCOUNTER — Ambulatory Visit (INDEPENDENT_AMBULATORY_CARE_PROVIDER_SITE_OTHER)

## 2023-08-08 DIAGNOSIS — E538 Deficiency of other specified B group vitamins: Secondary | ICD-10-CM

## 2023-08-08 MED ORDER — CYANOCOBALAMIN 1000 MCG/ML IJ SOLN
1000.0000 ug | Freq: Once | INTRAMUSCULAR | Status: AC
  Administered 2023-08-08: 1000 ug via INTRAMUSCULAR

## 2023-08-08 NOTE — Progress Notes (Signed)
 After obtaining consent, and per orders of Dr. Yetta Barre, injection of B12 given by Ferdie Ping. Patient instructed to report any adverse reaction to me immediately.

## 2023-08-09 ENCOUNTER — Ambulatory Visit (HOSPITAL_BASED_OUTPATIENT_CLINIC_OR_DEPARTMENT_OTHER): Admitting: Anesthesiology

## 2023-08-09 ENCOUNTER — Ambulatory Visit (HOSPITAL_BASED_OUTPATIENT_CLINIC_OR_DEPARTMENT_OTHER)
Admission: RE | Admit: 2023-08-09 | Discharge: 2023-08-09 | Disposition: A | Discharge status: Home / Self Care | Source: Ambulatory Visit | Attending: Otolaryngology | Admitting: Otolaryngology

## 2023-08-09 ENCOUNTER — Other Ambulatory Visit: Payer: Self-pay

## 2023-08-09 ENCOUNTER — Encounter (HOSPITAL_BASED_OUTPATIENT_CLINIC_OR_DEPARTMENT_OTHER): Payer: Self-pay | Admitting: Otolaryngology

## 2023-08-09 ENCOUNTER — Encounter (HOSPITAL_BASED_OUTPATIENT_CLINIC_OR_DEPARTMENT_OTHER)
Admission: RE | Disposition: A | Discharge status: Home / Self Care | Payer: Self-pay | Source: Ambulatory Visit | Attending: Otolaryngology

## 2023-08-09 DIAGNOSIS — J4540 Moderate persistent asthma, uncomplicated: Secondary | ICD-10-CM | POA: Insufficient documentation

## 2023-08-09 DIAGNOSIS — E119 Type 2 diabetes mellitus without complications: Secondary | ICD-10-CM | POA: Diagnosis not present

## 2023-08-09 DIAGNOSIS — G4733 Obstructive sleep apnea (adult) (pediatric): Secondary | ICD-10-CM

## 2023-08-09 DIAGNOSIS — T85898A Other specified complication of other internal prosthetic devices, implants and grafts, initial encounter: Secondary | ICD-10-CM | POA: Diagnosis not present

## 2023-08-09 DIAGNOSIS — I10 Essential (primary) hypertension: Secondary | ICD-10-CM | POA: Diagnosis not present

## 2023-08-09 DIAGNOSIS — K219 Gastro-esophageal reflux disease without esophagitis: Secondary | ICD-10-CM | POA: Diagnosis not present

## 2023-08-09 DIAGNOSIS — J45909 Unspecified asthma, uncomplicated: Secondary | ICD-10-CM

## 2023-08-09 DIAGNOSIS — Y758 Miscellaneous neurological devices associated with adverse incidents, not elsewhere classified: Secondary | ICD-10-CM | POA: Insufficient documentation

## 2023-08-09 DIAGNOSIS — T85890A Other specified complication of nervous system prosthetic devices, implants and grafts, initial encounter: Secondary | ICD-10-CM | POA: Diagnosis not present

## 2023-08-09 DIAGNOSIS — E039 Hypothyroidism, unspecified: Secondary | ICD-10-CM | POA: Diagnosis not present

## 2023-08-09 DIAGNOSIS — Z9682 Presence of neurostimulator: Secondary | ICD-10-CM | POA: Diagnosis not present

## 2023-08-09 DIAGNOSIS — N1831 Chronic kidney disease, stage 3a: Secondary | ICD-10-CM

## 2023-08-09 HISTORY — PX: IMPLANTATION OF HYPOGLOSSAL NERVE STIMULATOR: SHX6827

## 2023-08-09 HISTORY — DX: Type 2 diabetes mellitus without complications: E11.9

## 2023-08-09 LAB — GLUCOSE, CAPILLARY
Glucose-Capillary: 97 mg/dL (ref 70–99)
Glucose-Capillary: 90 mg/dL (ref 70–99)

## 2023-08-09 SURGERY — INSERTION, HYPOGLOSSAL NERVE STIMULATOR
Anesthesia: General | Site: Chest | Laterality: Bilateral

## 2023-08-09 MED ORDER — LIDOCAINE-EPINEPHRINE 1 %-1:100000 IJ SOLN
INTRAMUSCULAR | Status: AC
  Filled 2023-08-09: qty 1

## 2023-08-09 MED ORDER — ACETAMINOPHEN 10 MG/ML IV SOLN: 1000.0000 mg | Freq: Once | INTRAVENOUS | Status: DC | PRN

## 2023-08-09 MED ORDER — ONDANSETRON HCL 4 MG/2ML IJ SOLN: 4.0000 mg | Freq: Once | INTRAMUSCULAR | Status: DC | PRN

## 2023-08-09 MED ORDER — PHENYLEPHRINE 80 MCG/ML (10ML) SYRINGE FOR IV PUSH (FOR BLOOD PRESSURE SUPPORT)
PREFILLED_SYRINGE | INTRAVENOUS | Status: AC
  Filled 2023-08-09: qty 10

## 2023-08-09 MED ORDER — GLYCOPYRROLATE PF 0.2 MG/ML IJ SOSY
PREFILLED_SYRINGE | INTRAMUSCULAR | Status: AC
  Filled 2023-08-09: qty 1

## 2023-08-09 MED ORDER — PHENYLEPHRINE HCL-NACL 20-0.9 MG/250ML-% IV SOLN
INTRAVENOUS | Status: DC | PRN
  Administered 2023-08-09: 40 ug/min via INTRAVENOUS

## 2023-08-09 MED ORDER — MIDAZOLAM HCL 2 MG/2ML IJ SOLN
INTRAMUSCULAR | Status: AC
  Filled 2023-08-09: qty 2

## 2023-08-09 MED ORDER — LACTATED RINGERS IV SOLN: INTRAVENOUS | Status: DC | PRN

## 2023-08-09 MED ORDER — CEFAZOLIN SODIUM-DEXTROSE 2-4 GM/100ML-% IV SOLN
INTRAVENOUS | Status: AC
  Filled 2023-08-09: qty 100

## 2023-08-09 MED ORDER — AMISULPRIDE (ANTIEMETIC) 5 MG/2ML IV SOLN: 10.0000 mg | Freq: Once | INTRAVENOUS | Status: DC | PRN

## 2023-08-09 MED ORDER — MIDAZOLAM HCL 5 MG/5ML IJ SOLN
INTRAMUSCULAR | Status: DC | PRN
Start: 2023-08-09 — End: 2023-08-09
  Administered 2023-08-09: 1 mg via INTRAVENOUS

## 2023-08-09 MED ORDER — FENTANYL CITRATE (PF) 100 MCG/2ML IJ SOLN
INTRAMUSCULAR | Status: DC | PRN
  Administered 2023-08-09 (×2): 25 ug via INTRAVENOUS
  Administered 2023-08-09: 50 ug via INTRAVENOUS

## 2023-08-09 MED ORDER — CEFAZOLIN SODIUM-DEXTROSE 2-3 GM-%(50ML) IV SOLR
INTRAVENOUS | Status: DC | PRN
Start: 2023-08-09 — End: 2023-08-09
  Administered 2023-08-09: 2 g via INTRAVENOUS

## 2023-08-09 MED ORDER — ACETAMINOPHEN 500 MG PO TABS
ORAL_TABLET | ORAL | Status: AC
Start: 2023-08-09 — End: 2023-08-09
  Filled 2023-08-09: qty 2

## 2023-08-09 MED ORDER — ACETAMINOPHEN 10 MG/ML IV SOLN
INTRAVENOUS | Status: DC | PRN
  Administered 2023-08-09: 1000 mg via INTRAVENOUS

## 2023-08-09 MED ORDER — SUCCINYLCHOLINE CHLORIDE 200 MG/10ML IV SOSY
PREFILLED_SYRINGE | INTRAVENOUS | Status: DC | PRN
Start: 2023-08-09 — End: 2023-08-09
  Administered 2023-08-09: 40 mg via INTRAVENOUS

## 2023-08-09 MED ORDER — LACTATED RINGERS IV SOLN: INTRAVENOUS | Status: DC

## 2023-08-09 MED ORDER — PROPOFOL 500 MG/50ML IV EMUL
INTRAVENOUS | Status: DC | PRN
  Administered 2023-08-09: 100 ug/kg/min via INTRAVENOUS

## 2023-08-09 MED ORDER — PROPOFOL 10 MG/ML IV BOLUS
INTRAVENOUS | Status: DC | PRN
  Administered 2023-08-09: 150 mg via INTRAVENOUS
  Administered 2023-08-09: 50 mg via INTRAVENOUS

## 2023-08-09 MED ORDER — LIDOCAINE 2% (20 MG/ML) 5 ML SYRINGE
INTRAMUSCULAR | Status: AC
  Filled 2023-08-09: qty 5

## 2023-08-09 MED ORDER — FENTANYL CITRATE (PF) 100 MCG/2ML IJ SOLN
INTRAMUSCULAR | Status: AC
  Filled 2023-08-09: qty 2

## 2023-08-09 MED ORDER — ONDANSETRON HCL 4 MG/2ML IJ SOLN
INTRAMUSCULAR | Status: DC | PRN
  Administered 2023-08-09: 4 mg via INTRAVENOUS

## 2023-08-09 MED ORDER — ONDANSETRON HCL 4 MG/2ML IJ SOLN
INTRAMUSCULAR | Status: AC
  Filled 2023-08-09: qty 2

## 2023-08-09 MED ORDER — ACETAMINOPHEN 10 MG/ML IV SOLN
INTRAVENOUS | Status: AC
  Filled 2023-08-09: qty 100

## 2023-08-09 MED ORDER — DEXMEDETOMIDINE HCL IN NACL 80 MCG/20ML IV SOLN
INTRAVENOUS | Status: DC | PRN
  Administered 2023-08-09: 6 ug via INTRAVENOUS

## 2023-08-09 MED ORDER — SUCCINYLCHOLINE CHLORIDE 200 MG/10ML IV SOSY
PREFILLED_SYRINGE | INTRAVENOUS | Status: AC
  Filled 2023-08-09: qty 10

## 2023-08-09 MED ORDER — PHENYLEPHRINE 80 MCG/ML (10ML) SYRINGE FOR IV PUSH (FOR BLOOD PRESSURE SUPPORT)
PREFILLED_SYRINGE | INTRAVENOUS | Status: DC | PRN
  Administered 2023-08-09: 80 ug via INTRAVENOUS
  Administered 2023-08-09: 40 ug via INTRAVENOUS

## 2023-08-09 MED ORDER — FENTANYL CITRATE (PF) 100 MCG/2ML IJ SOLN
25.0000 ug | INTRAMUSCULAR | Status: DC | PRN
  Administered 2023-08-09: 25 ug via INTRAVENOUS

## 2023-08-09 MED ORDER — HYDROCODONE-ACETAMINOPHEN 5-325 MG PO TABS: 1.0000 | ORAL_TABLET | Freq: Four times a day (QID) | ORAL | 0 refills | Status: DC | PRN

## 2023-08-09 MED ORDER — ACETAMINOPHEN 500 MG PO TABS: 1000.0000 mg | ORAL_TABLET | Freq: Once | ORAL | Status: DC

## 2023-08-09 MED ORDER — LIDOCAINE-EPINEPHRINE 1 %-1:100000 IJ SOLN
INTRAMUSCULAR | Status: DC | PRN
  Administered 2023-08-09: 4.5 mL

## 2023-08-09 MED ORDER — 0.9 % SODIUM CHLORIDE (POUR BTL) OPTIME
TOPICAL | Status: DC | PRN
  Administered 2023-08-09: 120 mL

## 2023-08-09 MED ORDER — DEXAMETHASONE SODIUM PHOSPHATE 10 MG/ML IJ SOLN
INTRAMUSCULAR | Status: DC | PRN
  Administered 2023-08-09: 8 mg via INTRAVENOUS

## 2023-08-09 MED ORDER — LIDOCAINE 2% (20 MG/ML) 5 ML SYRINGE
INTRAMUSCULAR | Status: DC | PRN
  Administered 2023-08-09: 60 mg via INTRAVENOUS

## 2023-08-09 SURGICAL SUPPLY — 55 items
BLADE CLIPPER SURG (BLADE) IMPLANT
BLADE SURG 15 STRL LF DISP TIS (BLADE) ×1 IMPLANT
CANISTER SUCT 1200ML W/VALVE (MISCELLANEOUS) ×1 IMPLANT
CORD BIPOLAR FORCEPS 12FT (ELECTRODE) ×1 IMPLANT
COVER PROBE CYLINDRICAL 5X96 (MISCELLANEOUS) ×1 IMPLANT
DERMABOND ADVANCED .7 DNX12 (GAUZE/BANDAGES/DRESSINGS) ×2 IMPLANT
DRAPE C-ARM 35X43 STRL (DRAPES) ×1 IMPLANT
DRAPE HEAD BAR (DRAPES) IMPLANT
DRAPE INCISE IOBAN 66X45 STRL (DRAPES) ×1 IMPLANT
DRAPE MICROSCOPE WILD 40.5X102 (DRAPES) ×1 IMPLANT
DRAPE UTILITY XL STRL (DRAPES) ×1 IMPLANT
DRSG TEGADERM 2-3/8X2-3/4 SM (GAUZE/BANDAGES/DRESSINGS) ×2 IMPLANT
DRSG TEGADERM 4X4.75 (GAUZE/BANDAGES/DRESSINGS) IMPLANT
ELECT COATED BLADE 2.86 ST (ELECTRODE) ×1 IMPLANT
ELECTRODE EMG 18 NIMS (NEUROSURGERY SUPPLIES) ×1 IMPLANT
ELECTRODE REM PT RTRN 9FT ADLT (ELECTROSURGICAL) ×1 IMPLANT
FORCEPS BIPOLAR SPETZLER 8 1.0 (NEUROSURGERY SUPPLIES) ×1 IMPLANT
GAUZE 4X4 16PLY ~~LOC~~+RFID DBL (SPONGE) ×1 IMPLANT
GAUZE SPONGE 4X4 12PLY STRL (GAUZE/BANDAGES/DRESSINGS) ×1 IMPLANT
GLOVE BIO SURGEON STRL SZ 6.5 (GLOVE) IMPLANT
GLOVE BIO SURGEON STRL SZ7.5 (GLOVE) ×1 IMPLANT
GLOVE BIOGEL PI IND STRL 7.0 (GLOVE) IMPLANT
GLOVE BIOGEL PI IND STRL 7.5 (GLOVE) IMPLANT
GLOVE SURG SS PI 7.0 STRL IVOR (GLOVE) IMPLANT
GOWN STRL REUS W/ TWL LRG LVL3 (GOWN DISPOSABLE) ×3 IMPLANT
IV CATH 18G SAFETY (IV SOLUTION) ×1 IMPLANT
KIT NEURO ACCESSORY W/WRENCH (MISCELLANEOUS) IMPLANT
LOOP VASCLR MAXI BLUE 18IN ST (MISCELLANEOUS) ×1 IMPLANT
LOOP VESSEL MINI RED (MISCELLANEOUS) ×1 IMPLANT
LOOPS VASCLR MAXI BLUE 18IN ST (MISCELLANEOUS) IMPLANT
MARKER SKIN DUAL TIP RULER LAB (MISCELLANEOUS) ×1 IMPLANT
NDL HYPO 25X1 1.5 SAFETY (NEEDLE) ×1 IMPLANT
NEEDLE HYPO 25X1 1.5 SAFETY (NEEDLE) ×1 IMPLANT
NS IRRIG 1000ML POUR BTL (IV SOLUTION) ×1 IMPLANT
PACK BASIN DAY SURGERY FS (CUSTOM PROCEDURE TRAY) ×1 IMPLANT
PACK ENT DAY SURGERY (CUSTOM PROCEDURE TRAY) ×1 IMPLANT
PASSER CATH 36 CODMAN DISP (NEUROSURGERY SUPPLIES) IMPLANT
PASSER CATH 38CM DISP (INSTRUMENTS) IMPLANT
PENCIL SMOKE EVACUATOR (MISCELLANEOUS) ×1 IMPLANT
POUCH TYRX ANTIBAC NEURO MED (Mesh General) IMPLANT
PROBE NERVE STIMULATOR (NEUROSURGERY SUPPLIES) ×1 IMPLANT
REMOTE CONTROL SLEEP INSPIRE (MISCELLANEOUS) ×1 IMPLANT
SET WALTER ACTIVATION W/DRAPE (SET/KITS/TRAYS/PACK) ×1 IMPLANT
SLEEVE SCD COMPRESS KNEE MED (STOCKING) ×1 IMPLANT
SPONGE INTESTINAL PEANUT (DISPOSABLE) ×1 IMPLANT
SUT SILK 2 0 SH (SUTURE) ×1 IMPLANT
SUT SILK 2 0 SH CR/8 (SUTURE) IMPLANT
SUT SILK 3 0 REEL (SUTURE) ×1 IMPLANT
SUT SILK 3 0 SH 30 (SUTURE) ×1 IMPLANT
SUT VIC AB 3-0 SH 27X BRD (SUTURE) ×2 IMPLANT
SUT VIC AB 4-0 PS2 27 (SUTURE) ×2 IMPLANT
SUTURE SILK 3-0 RB1 30XBRD (SUTURE) ×1 IMPLANT
SYR 10ML LL (SYRINGE) ×1 IMPLANT
SYR BULB EAR ULCER 3OZ GRN STR (SYRINGE) ×1 IMPLANT
TOWEL GREEN STERILE FF (TOWEL DISPOSABLE) ×2 IMPLANT

## 2023-08-09 NOTE — Discharge Instructions (Signed)
 No Tylenol  until 9:30pm tonight if needed.

## 2023-08-09 NOTE — Anesthesia Postprocedure Evaluation (Signed)
 Anesthesia Post Note  Patient: [name]  Procedure(s) Performed: REVISION CAPSULOTOMY (Bilateral: Chest)     Patient location during evaluation: PACU Anesthesia Type: General Level of consciousness: awake Pain management: pain level controlled Vital Signs Assessment: post-procedure vital signs reviewed and stable Respiratory status: spontaneous breathing, nonlabored ventilation and respiratory function stable Cardiovascular status: blood pressure returned to baseline and stable Postop Assessment: no apparent nausea or vomiting Anesthetic complications: no   No notable events documented.  Last Vitals:  Vitals:   08/09/23 1700 08/09/23 1714  BP: 106/84 112/78  Pulse:  78  Resp:  17  Temp:  (!) 36.3 C  SpO2:  96%    Last Pain:  Vitals:   08/09/23 1714  TempSrc:   PainSc: 3                  Demari Kropp P Kayslee Furey

## 2023-08-09 NOTE — Anesthesia Procedure Notes (Signed)
 Procedure Name: Intubation Date/Time: 08/09/2023 3:22 PM  Performed by: Denton Niels CROME, CRNAPre-anesthesia Checklist: Patient identified, Emergency Drugs available, Suction available and Patient being monitored Patient Re-evaluated:Patient Re-evaluated prior to induction Oxygen  Delivery Method: Circle system utilized Preoxygenation: Pre-oxygenation with 100% oxygen  Induction Type: IV induction Ventilation: Mask ventilation without difficulty Laryngoscope Size: McGrath and 3 Grade View: Grade III Tube type: Oral Tube size: 7.0 mm Number of attempts: 2 Airway Equipment and Method: Stylet Placement Confirmation: ETT inserted through vocal cords under direct vision, positive ETCO2 and breath sounds checked- equal and bilateral Tube secured with: Tape Dental Injury: Teeth and Oropharynx as per pre-operative assessment

## 2023-08-09 NOTE — H&P (Signed)
 [name] is an 68 y.o. female.   Chief Complaint: Sleep apnea HPI: 68 year old female underwent Inspire placement in January but has found that the generator has repeatedly flipped out within its capsule when she moves in different positions, resulting in discomfort.  The problem has not improved.  Past Medical History:  Diagnosis Date   Allergic rhinitis    Anxiety    Benign essential tremor    hands   Bipolar 1 disorder, mixed, moderate (HCC)    Claustrophobia    Depression    Diabetes (HCC)    Eczema    Frequency of urination    GERD (gastroesophageal reflux disease)    History of frequent urinary tract infections    History of gastroesophageal reflux (GERD)    05-25-2017  per pt no issues since hiatal hernia repair 01/ 2018   History of hiatal hernia    History of melanoma excision    early 2000s-- BACK   History of panic attacks    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   Hypothyroidism    Intermittent palpitations    cardiology--  dr swaziland   Interstitial cystitis    Limited jaw range of motion    s/p  bilateral TMJ surgery,  age 30s   Migraines    Moderate persistent asthma    pulmologist-- dr shellia   OA (osteoarthritis)    both knees   OSA on    per last sleep study 09/ 2017  mild OSA, AHI13/hr   PONV (postoperative nausea and vomiting)    PVC (premature ventricular contraction)    Rosacea    Sensation of pressure in bladder area    per pt intermittant   TMJ arthralgia    Urticaria    Wears glasses    White coat syndrome without hypertension    05-25-2017  per pt hx hypertension yrs ago, no issues after quitting stressful job    Past Surgical History:  Procedure Laterality Date   24 HOUR PH STUDY N/A 09/22/2015   Procedure: 24 HOUR PH STUDY;  Surgeon: Victory LITTIE Legrand DOUGLAS, MD;  Location: [facility];  Service: Gastroenterology;  Laterality: N/A;   ADENOIDECTOMY     ANAL FISSURE REPAIR  age 68  (45)   BREAST EXCISIONAL BIOPSY  Right 04-16-2003  dr p. young  [facility]   benign   BUNIONECTOMY Right early 2000s   CARPAL TUNNEL RELEASE Left age 35 (40)   CHOLECYSTECTOMY OPEN  age 19   CYSTO WITH HYDRODISTENSION N/A 06/02/2017   Procedure: CYSTOSCOPY/HYDRODISTENSION OF BLADDER;  Surgeon: Watt Rush, MD;  Location: [facility];  Service: Urology;  Laterality: N/A;   CYSTO/ HYDRODISTENTION/  INSTILLATION THERAPY  1990s   DX LAPAROSCOPY/  DX HYSTEROSCOPY/  D & C  08-07-2007   dr dove  [facility]   ENDOMETRIAL ABLATION W/ NOVASURE  10-21-2008   dr herchel  [facility]   ESOPHAGEAL MANOMETRY N/A 09/22/2015   Procedure: ESOPHAGEAL MANOMETRY (EM);  Surgeon: Victory LITTIE Legrand DOUGLAS, MD;  Location: [facility];  Service: Gastroenterology;  Laterality: N/A;  with impedence    FINGER ARTHROPLASTY Bilateral    left little finger 2016:  right middle finger 06/ 2018   IMPLANTATION OF HYPOGLOSSAL NERVE STIMULATOR Right 03/09/2023   Procedure: RIGHT IMPLANTATION OF HYPOGLOSSAL NERVE STIMULATOR;  Surgeon: Carlie Clark, MD;  Location: [facility];  Service: ENT;  Laterality: Right;   KNEE ARTHROSCOPY Bilateral x3 left ;  x3  right-- last one age 65 (69)   NASAL SEPTUM SURGERY  age 69s   ROBOT ASSISTED REDUCTION PARAESOPHAGEAL HIATAL HERNIA/ TYPE 2 MEDIASTINAL DISSECTION/ PRIMARY HIATAL HERNIA REPAIR/ ANTERIOR & POSTERIOR GASTROPEXY/ NISSEN FUNDOPLICATION  03-04-2017   DR GROSS  [facility]   SINOSCOPY     TEMPOROMANDIBULAR JOINT SURGERY Bilateral x3  -- age 75s   per pt used graft   TONSILLECTOMY     TOTAL KNEE ARTHROPLASTY Left 12/26/2017   Procedure: LEFT TOTAL KNEE ARTHROPLASTY;  Surgeon: Melodi Lerner, MD;  Location: [facility];  Service: Orthopedics;  Laterality: Left;    TRANSTHORACIC ECHOCARDIOGRAM  12-06-2017   dr [place]   ef 55-60%, grade 1 diastoilc dysfunction, trivial MR   TRIGGER FINGER RELEASE Bilateral last one 2017   several release's bilaterally   ULNAR NERVE TRANSPOSITION Bilateral age 63 (71)    Family  History  Problem Relation Age of Onset   Hypertension Mother        deceased from MVA complications   Thyroid  disease Mother    Allergic rhinitis Mother    Testicular cancer Father    Allergic rhinitis Father    Colon cancer Sister    Colon polyps Sister    Healthy Sister    Healthy Brother    Coronary artery disease Other    Stomach cancer Neg Hx    Social History:  reports that she has never smoked. She has never been exposed to tobacco smoke. She has never used smokeless tobacco. She reports current alcohol use. She reports that she does not use drugs.  Allergies:  Allergies  Allergen Reactions   Emetrol Itching   Indomethacin Other (See Comments)    Muscle spasms Causes muscle spasms in neck   Iodinated Contrast Media Itching    Flushed and Fever, itch all over   Amlodipine  Swelling    Peripheral edema   Carbamazepine  Other (See Comments)    Easy and unusual bleeding    Pseudoephedrine Other (See Comments)    I fly off the walls  CANNOT SLEEP   Risperidone And Related Other (See Comments)    Stomach upset, insomnia, drooling, tremors, jerks, sensitivity to touch.    Bupivacaine Hives   Pentazocine Other (See Comments)    Unknown reaction MADE ME LACTATE   Propoxyphene Itching and Nausea And Vomiting    darvocet   Sulfa Antibiotics Other (See Comments)    Unknown reaction   Tramadol  Other (See Comments)    Patient can not remember   Aspirin Other (See Comments)    upset stomach, ringing in the ears   Codeine Itching and Other (See Comments)    Anything related to codeine   Etodolac Rash and Other (See Comments)   Propranolol Nausea Only and Other (See Comments)    Facility-Administered Medications Prior to Admission  Medication Dose Route Frequency Provider Last Rate Last Admin   denosumab  (PROLIA ) injection 60 mg  60 mg Subcutaneous Once Joshua Debby CROME, MD       denosumab  (PROLIA ) injection 60 mg  60 mg Subcutaneous Q6 months Joshua Debby CROME, MD        Medications Prior to Admission  Medication Sig Dispense Refill   albuterol  (VENTOLIN  HFA) 108 (90 Base) MCG/ACT inhaler INHALE 1-2 PUFFS INTO THE LUNGS DAILY AS NEEDED FOR WHEEZING OR SHORTNESS OF BREATH. 18 each 1   Azelastine  HCl 137 MCG/SPRAY SOLN Place 2 sprays into the nose 2 (two) times daily. 90 mL 3   Carbinoxamine  Maleate 4 MG  TABS Take 1 tablet (4 mg total) by mouth every 6 (six) hours. 360 tablet 1   cariprazine (VRAYLAR) 3 MG capsule 4.5 mg.     clonazePAM  (KLONOPIN ) 1 MG tablet Take 1 mg by mouth daily as needed.     dicyclomine  (BENTYL ) 10 MG capsule TAKE 1 CAPSULE (10 MG TOTAL) BY MOUTH 3 (THREE) TIMES DAILY BEFORE MEALS. 270 capsule 1   DULoxetine (CYMBALTA) 60 MG capsule Take 60 mg by mouth 2 (two) times daily at 10 AM and 5 PM.     JARDIANCE  10 MG TABS tablet TAKE 1 TABLET BY MOUTH DAILY BEFORE BREAKFAST. 90 tablet 1   levothyroxine  (SYNTHROID ) 25 MCG tablet TAKE 1 TABLET BY MOUTH EVERY DAY 90 tablet 0   metoprolol  succinate (TOPROL -XL) 25 MG 24 hr tablet TAKE 1 TABLET (25 MG TOTAL) BY MOUTH DAILY. 90 tablet 0   montelukast  (SINGULAIR ) 10 MG tablet Take 1 tablet (10 mg total) by mouth at bedtime. 90 tablet 3   MYRBETRIQ 50 MG TB24 tablet Take 50 mg by mouth daily.     NURTEC 75 MG TBDP TAKE 1 TABLET BY MOUTH EVERY OTHER DAY 40 tablet 2   promethazine  (PHENERGAN ) 12.5 MG tablet Take 1 tablet (12.5 mg total) by mouth every 6 (six) hours as needed for nausea or vomiting. 30 tablet 0   rosuvastatin  (CRESTOR ) 20 MG tablet TAKE 1 TABLET BY MOUTH EVERY DAY 90 tablet 0   spironolactone  (ALDACTONE ) 25 MG tablet TAKE 1 TABLET (25 MG TOTAL) BY MOUTH DAILY. 90 tablet 0   tiZANidine  (ZANAFLEX ) 2 MG tablet TAKE 1 TABLET (2MG  TOTAL) BY MOUTH TWICE A DAY AS NEEDED FOR MUSCLE SPASM 180 tablet 1   zinc  gluconate 50 MG tablet Take 50 mg by mouth daily.     trihexyphenidyl  (ARTANE ) 2 MG tablet TAKE 1 TABLET BY MOUTH EVERY DAY 90 tablet 0    Results for orders placed or performed during the  hospital encounter of 08/09/23 (from the past 48 hours)  Glucose, capillary     Status: None   Collection Time: 08/09/23  1:38 PM  Result Value Ref Range   Glucose-Capillary 97 70 - 99 mg/dL    Comment: Glucose reference range applies only to samples taken after fasting for at least 8 hours.   No results found.  Review of Systems  All other systems reviewed and are negative.   Blood pressure 113/84, pulse 78, temperature (!) 97.3 F (36.3 C), temperature source Temporal, resp. rate 18, height 4' 11 (1.499 m), weight 64 kg, last menstrual period 03/05/2012, SpO2 98%. Physical Exam Constitutional:      Appearance: Normal appearance. She is normal weight.  HENT:     Head: Normocephalic and atraumatic.     Right Ear: External ear normal.     Left Ear: External ear normal.     Nose: Nose normal.     Mouth/Throat:     Mouth: Mucous membranes are moist.     Pharynx: Oropharynx is clear.   Eyes:     Extraocular Movements: Extraocular movements intact.     Pupils: Pupils are equal, round, and reactive to light.    Cardiovascular:     Rate and Rhythm: Normal rate.  Pulmonary:     Effort: Pulmonary effort is normal.   Skin:    General: Skin is warm and dry.   Neurological:     General: No focal deficit present.     Mental Status: She is alert and oriented to person,  place, and time.   Psychiatric:        Mood and Affect: Mood normal.        Behavior: Behavior normal.        Thought Content: Thought content normal.        Judgment: Judgment normal.      Assessment/Plan Obstructive sleep apnea, Inspire capsule laxity  Due to continued instability of the device within its capsule, we will proceed with revision capsulotomy with placement of a mesh pouch to try to stabilize the device.  Vaughan Ricker, MD 08/09/2023, 2:47 PM

## 2023-08-09 NOTE — Transfer of Care (Signed)
 Immediate Anesthesia Transfer of Care Note  Patient: [name]  Procedure(s) Performed: REVISION CAPSULOTOMY (Bilateral: Chest)  Patient Location: PACU  Anesthesia Type:General  Level of Consciousness: awake, alert , oriented, and patient cooperative  Airway & Oxygen  Therapy: Patient Spontanous Breathing and Patient connected to face mask oxygen   Post-op Assessment: Report given to RN and Post -op Vital signs reviewed and stable  Post vital signs: Reviewed and stable  Last Vitals:  Vitals Value Taken Time  BP 121/55 08/09/23 16:36  Temp    Pulse 76 08/09/23 16:37  Resp 15 08/09/23 16:37  SpO2 100 % 08/09/23 16:37  Vitals shown include unfiled device data.  Last Pain:  Vitals:   08/09/23 1338  TempSrc: Temporal  PainSc: 0-No pain         Complications: No notable events documented.

## 2023-08-09 NOTE — Op Note (Signed)
 PREOPERATIVE DIAGNOSIS:  Obstructive sleep apnea, Inspire capsule laxity.   POSTOPERATIVE DIAGNOSIS:  Obstructive sleep apnea, Inspire capsule laxity.   PROCEDURE:  Revision capsulotomy for Inspire generator with insertion of mesh pouch.   SURGEON:  Vaughan Ricker, MD   ASSISTANT:  None.   ANESTHESIA:  General endotracheal anesthesia.   COMPLICATIONS:  None.   INDICATIONS:  The patient is a 68 year old female with a history of obstructive sleep apnea who underwent Inspire placement in January.  Unfortunately, the generator in the chest wall has been mobile, hinging outward in certain positions and causing discomfort.  The problem has not improved.  She presents to the operating room for revision capsulotomy.   FINDINGS:  Capsule around generator appeared normal with minimal clear fluid within.  The device was tested after completion of the case and was found to be functioning normally.   DESCRIPTION OF PROCEDURE:  The patient was identified in the holding room, informed consent having been obtained, including discussion of risks, benefits and alternatives, the patient was brought to the operative suite and put on the operative table in the supine position.  Anesthesia was induced and the patient was intubated by the anesthesia team without difficulty.  The patient was given intravenous antibiotics during the case.  The eyes were taped closed and the bed was turned 180 degrees from  anesthesia.  The right chest incision scar was injected with 1% lidocaine  with 1:100,000 epinephrine .  The nerve integrity monitor was placed in the right lateral tongue and floor of mouth and turned on during the case.  The right neck and chest were prepped and draped in sterile fashion.  The chest incision scar was excised using a 15 blade scalpel.  Dissection was carried then through subcutaneous tissue to the capsule of the generator.  The capsule wall was incised, exposing the generator.  Care was taken to avoid the  leads.  The anchoring sutures superiorly were divided and the generator was elevated out of the capsule.  The posterior capsule wall was then removed using scissors, exposing the underlying pectoralis muscle.  The Medtronic TYRX Neuro Absorbable Antibacterial Envelope- Medium was then opened and soaked in saline.  The pouch was turned inside out.  The generator was placed within the pouch.  The inferior side of the pouch was sutured to the inferior capsule/muscle using 2-0 silk in two locations.  The superior anchoring sutures were replaced through the holes on the generator and tacked to the superior capsule.  At this point, the device was tested and found to be functioning well.  The chest wound was closed in the deep tissues with 3-0 Vicryl suture in simple interrupted fashion and in the subcutaneous layer using 4-0 Vicryl suture in a simple interrupted fashion.  The incision was covered with Dermabond.  The patient was then cleaned off and drapes were removed.  The nerve integrity monitor was removed.  The incision was then covered with gauze pressure dressing.  The patient was then returned to anesthesia for wakeup and was extubated and moved to the recovery room in stable condition.

## 2023-08-09 NOTE — Anesthesia Preprocedure Evaluation (Addendum)
 Anesthesia Evaluation  Patient identified by MRN, date of birth, ID band Patient awake    Reviewed: Allergy & Precautions, NPO status , Patient's Chart, lab work & pertinent test results  Airway Mallampati: IV  TM Distance: >3 FB Neck ROM: Full    Dental no notable dental hx.    Pulmonary asthma , sleep apnea    Pulmonary exam normal        Cardiovascular hypertension, Pt. on home beta blockers Normal cardiovascular exam     Neuro/Psych  Headaches PSYCHIATRIC DISORDERS Anxiety Depression Bipolar Disorder    Neuromuscular disease    GI/Hepatic Neg liver ROS, hiatal hernia,,,  Endo/Other  diabetes, Oral Hypoglycemic AgentsHypothyroidism    Renal/GU Renal disease     Musculoskeletal  (+) Arthritis ,    Abdominal   Peds  Hematology negative hematology ROS (+)   Anesthesia Other Findings OBSTRUCTIVE SLEEP APNEA  Reproductive/Obstetrics                             Anesthesia Physical Anesthesia Plan  ASA: 3  Anesthesia Plan: General   Post-op Pain Management:    Induction: Intravenous  PONV Risk Score and Plan: 3 and Ondansetron , Dexamethasone , Midazolam  and Treatment may vary due to age or medical condition  Airway Management Planned: Oral ETT and Video Laryngoscope Planned  Additional Equipment:   Intra-op Plan:   Post-operative Plan: Extubation in OR  Informed Consent: I have reviewed the patients History and Physical, chart, labs and discussed the procedure including the risks, benefits and alternatives for the proposed anesthesia with the patient or authorized representative who has indicated his/her understanding and acceptance.     Dental advisory given  Plan Discussed with: CRNA  Anesthesia Plan Comments:        Anesthesia Quick Evaluation

## 2023-08-10 ENCOUNTER — Encounter (HOSPITAL_BASED_OUTPATIENT_CLINIC_OR_DEPARTMENT_OTHER): Payer: Self-pay | Admitting: Otolaryngology

## 2023-08-11 ENCOUNTER — Other Ambulatory Visit: Payer: Self-pay | Admitting: Internal Medicine

## 2023-08-11 DIAGNOSIS — E785 Hyperlipidemia, unspecified: Secondary | ICD-10-CM

## 2023-08-11 DIAGNOSIS — E039 Hypothyroidism, unspecified: Secondary | ICD-10-CM

## 2023-08-16 ENCOUNTER — Other Ambulatory Visit: Payer: Self-pay | Admitting: Internal Medicine

## 2023-08-16 DIAGNOSIS — E039 Hypothyroidism, unspecified: Secondary | ICD-10-CM

## 2023-08-16 NOTE — Telephone Encounter (Unsigned)
 Copied from CRM [id number]. Topic: Clinical - Medication Refill >> Aug 16, 2023  9:30 AM [place] M wrote: Medication: levothyroxine  (SYNTHROID ) 25 MCG tablet  Has the patient contacted their pharmacy? Yes (Agent: If no, request that the patient contact the pharmacy for the refill. If patient does not wish to contact the pharmacy document the reason why and proceed with request.) (Agent: If yes, when and what did the pharmacy advise?)  This is the patient's preferred pharmacy:  [id number] - [place], [place] - [address] AT [facility] [address] [place] [address] Phone: [phone] Fax: [phone]    Is this the correct pharmacy for this prescription? Yes If no, delete pharmacy and type the correct one.   Has the prescription been filled recently? Yes  Is the patient out of the medication? Yes  Has the patient been seen for an appointment in the last year OR does the patient have an upcoming appointment? Yes  Can we respond through MyChart? Yes  Agent: Please be advised that Rx refills may take up to 3 business days. We ask that you follow-up with your pharmacy.

## 2023-08-17 ENCOUNTER — Other Ambulatory Visit: Payer: Self-pay | Admitting: Allergy & Immunology

## 2023-08-17 ENCOUNTER — Ambulatory Visit: Admitting: Orthopedic Surgery

## 2023-08-17 ENCOUNTER — Encounter: Payer: Self-pay | Admitting: Orthopedic Surgery

## 2023-08-17 DIAGNOSIS — M19011 Primary osteoarthritis, right shoulder: Secondary | ICD-10-CM

## 2023-08-17 DIAGNOSIS — F3132 Bipolar disorder, current episode depressed, moderate: Secondary | ICD-10-CM | POA: Diagnosis not present

## 2023-08-17 MED ORDER — LEVOTHYROXINE SODIUM 25 MCG PO TABS
25.0000 ug | ORAL_TABLET | Freq: Every day | ORAL | 0 refills | Status: DC
Start: 2023-08-17 — End: 2023-11-15

## 2023-08-17 NOTE — Progress Notes (Signed)
 Office Visit Note   Patient: [name]           Date of Birth: Jun 22, 1955           MRN: [id number] Visit Date: 08/17/2023 Requested by: Joshua Debby CROME, MD [address] [place],  [place] [address] PCP: Joshua Debby CROME, MD  Subjective: Chief Complaint  Patient presents with   Right Shoulder - Follow-up, Pain    HPI: [name] is a 68 y.o. female who presents to the office reporting bilateral shoulder pain.  Right is worse than left.  Patient had right glenohumeral joint injection 03/18/2023 which did not give her really any relief.  Tizanidine  has been helpful.  She uses an inspire device for sleep apnea.  Norco did help her shoulder.  Anti-inflammatories did not help and her kidneys are failing as well.  She is also diabetic.  She did have an MRI scan in December which shows pretty severe arthritis in the shoulder..                ROS: All systems reviewed are negative as they relate to the chief complaint within the history of present illness.  Patient denies fevers or chills.  Assessment & Plan: Visit Diagnoses:  1. Arthritis of right shoulder region     Plan: Impression is end-stage arthritis of the right shoulder with reasonable function at this time.  Patient really does not want to give any more surgery.  Left shoulder is more functional than the right and can compensate for any deficiencies in that right shoulder.  She wants to hold off on a glenohumeral joint injection as it did not help her much last time.  I think she may be heading for replacement in the future but she is going to hold off as long as she can.  Will see her back as needed.  Follow-Up Instructions: No follow-ups on file.   Orders:  No orders of the defined types were placed in this encounter.  No orders of the defined types were placed in this encounter.     Procedures: No procedures performed   Clinical Data: No additional findings.  Objective: Vital Signs: LMP 03/05/2012  (Approximate)   Physical Exam:  Constitutional: Patient appears well-developed HEENT:  Head: Normocephalic Eyes:EOM are normal Neck: Normal range of motion Cardiovascular: Normal rate Pulmonary/chest: Effort normal Neurologic: Patient is alert Skin: Skin is warm Psychiatric: Patient has normal mood and affect  Ortho Exam: Ortho exam demonstrates good cervical spine range of motion. 5 out of 5 grip EPL FPL interosseous wrist flexion extension bicep triceps and deltoid strength. She has pretty reasonable external rotation passively in both shoulders to about 50 to 55 degrees. Rotator cuff strength is about 5- out of 5 external rotation on the right and left-hand side but subscap strength is 5+ out of 5 bilaterally. No Popeye deformity is present. No discrete AC joint tenderness is present. As on either side. She does have some coarseness consistent with end-stage arthritis on the right but that is not present with passive range of motion on the left.   Specialty Comments:  No specialty comments available.  Imaging: No results found.   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 03/25/2023   Tachycardia 03/24/2023   Type 2 diabetes mellitus with stage 3b chronic kidney disease, without long-term current use of insulin (HCC) 10/26/2022  Type 2 diabetes mellitus with stage 3a chronic kidney disease, without long-term current use of insulin (HCC) 07/09/2022   Hyperlipidemia LDL goal <100 08/24/2021   Encounter for general adult medical examination with abnormal findings 03/14/2021   Estrogen deficiency 03/09/2021   Cervical cancer screening 03/09/2021   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   Grade I diastolic dysfunction 03/12/2020   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   Osteoarthritis of glenohumeral joint 03/26/2019   Iron deficiency anemia 04/28/2018   OA (osteoarthritis) of knee  12/26/2017   Vitamin D  deficiency, unspecified 10/13/2016   Primary osteoarthritis of left hand 06/30/2016   Osteoporosis without current pathological fracture 03/22/2016   Paraesophageal hiatal hernia s/p robotic repair & fundoplication 03/04/2016 03/04/2016   Bipolar 1 disorder, mixed, moderate (HCC) 01/29/2016   Class 1 obesity with body mass index (BMI) of 32.0 to 32.9 in adult 01/06/2016   Migraines 11/21/2014   Sleep apnea 01/30/2007   GERD 11/02/2006   Hypothyroidism 10/07/2006   Bipolar affective (HCC) 10/07/2006   Essential hypertension 10/07/2006   RHINITIS, CHRONIC 10/07/2006   Seasonal and perennial allergic rhinitis 10/07/2006   Moderate persistent asthma, uncomplicated 10/07/2006   Irritable bowel syndrome 10/07/2006   INTERSTITIAL CYSTITIS 10/07/2006   ROSACEA 10/07/2006   Osteoarthritis of both knees 10/07/2006   Past Medical History:  Diagnosis Date   Allergic rhinitis    Anxiety    Benign essential tremor    hands   Bipolar 1 disorder, mixed, moderate (HCC)    Claustrophobia    Depression    Diabetes (HCC)    Eczema    Frequency of urination    GERD (gastroesophageal reflux disease)    History of frequent urinary tract infections    History of gastroesophageal reflux (GERD)    05-25-2017  per pt no issues since hiatal hernia repair 01/ 2018   History of hiatal hernia    History of melanoma excision    early 2000s-- BACK   History of panic attacks    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   Hypothyroidism    Intermittent palpitations    cardiology--  dr swaziland   Interstitial cystitis    Limited jaw range of motion    s/p  bilateral TMJ surgery,  age 11s   Migraines    Moderate persistent asthma    pulmologist-- dr shellia   OA (osteoarthritis)    both knees   OSA on    per last sleep study 09/ 2017  mild OSA, AHI13/hr   PONV (postoperative nausea and vomiting)    PVC (premature ventricular contraction)    Rosacea     Sensation of pressure in bladder area    per pt intermittant   TMJ arthralgia    Urticaria    Wears glasses    White coat syndrome without hypertension    05-25-2017  per pt hx hypertension yrs ago, no issues after quitting stressful job    Family History  Problem Relation Age of Onset   Hypertension Mother        deceased from MVA complications   Thyroid  disease Mother    Allergic rhinitis Mother    Testicular cancer Father    Allergic rhinitis Father    Colon cancer Sister    Colon polyps Sister    Healthy Sister    Healthy Brother    Coronary artery disease Other    Stomach cancer Neg Hx     Past Surgical History:  Procedure  Laterality Date   18 HOUR PH STUDY N/A 09/22/2015   Procedure: 24 HOUR PH STUDY;  Surgeon: Victory LITTIE Legrand DOUGLAS, MD;  Location: [facility];  Service: Gastroenterology;  Laterality: N/A;   ADENOIDECTOMY     ANAL FISSURE REPAIR  age 89  (52)   BREAST EXCISIONAL BIOPSY Right 04-16-2003  dr p. young  [facility]   benign   BUNIONECTOMY Right early 2000s   CARPAL TUNNEL RELEASE Left age 64 (74)   CHOLECYSTECTOMY OPEN  age 5   CYSTO WITH HYDRODISTENSION N/A 06/02/2017   Procedure: CYSTOSCOPY/HYDRODISTENSION OF BLADDER;  Surgeon: Watt Rush, MD;  Location: [facility];  Service: Urology;  Laterality: N/A;   CYSTO/ HYDRODISTENTION/  INSTILLATION THERAPY  1990s   DX LAPAROSCOPY/  DX HYSTEROSCOPY/  D & C  08-07-2007   dr dove  [facility]   ENDOMETRIAL ABLATION W/ NOVASURE  10-21-2008   dr herchel  [facility]   ESOPHAGEAL MANOMETRY N/A 09/22/2015   Procedure: ESOPHAGEAL MANOMETRY (EM);  Surgeon: Victory LITTIE Legrand DOUGLAS, MD;  Location: [facility];  Service: Gastroenterology;  Laterality: N/A;  with impedence    FINGER ARTHROPLASTY Bilateral    left little finger 2016:  right middle finger 06/ 2018   IMPLANTATION OF HYPOGLOSSAL NERVE STIMULATOR Right 03/09/2023   Procedure: RIGHT IMPLANTATION OF HYPOGLOSSAL NERVE STIMULATOR;  Surgeon: Carlie Clark, MD;  Location: [facility];  Service: ENT;  Laterality: Right;   IMPLANTATION OF HYPOGLOSSAL NERVE STIMULATOR Bilateral 08/09/2023   Procedure: REVISION CAPSULOTOMY;  Surgeon: Carlie Clark, MD;  Location: [facility];  Service: ENT;  Laterality: Bilateral;   KNEE ARTHROSCOPY Bilateral x3 left ;  x3  right-- last one age 62 (36)   NASAL SEPTUM SURGERY  age 15s   ROBOT ASSISTED REDUCTION PARAESOPHAGEAL HIATAL HERNIA/ TYPE 2 MEDIASTINAL DISSECTION/ PRIMARY HIATAL HERNIA REPAIR/ ANTERIOR & POSTERIOR GASTROPEXY/ NISSEN FUNDOPLICATION  03-04-2017   DR GROSS  [facility]   SINOSCOPY     TEMPOROMANDIBULAR JOINT SURGERY Bilateral x3  -- age 89s   per pt used graft   TONSILLECTOMY     TOTAL KNEE ARTHROPLASTY Left 12/26/2017   Procedure: LEFT TOTAL KNEE ARTHROPLASTY;  Surgeon: Melodi Lerner, MD;  Location: [facility];  Service: Orthopedics;  Laterality: Left;    TRANSTHORACIC ECHOCARDIOGRAM  12-06-2017   dr [place]   ef 55-60%, grade 1 diastoilc dysfunction, trivial MR   TRIGGER FINGER RELEASE Bilateral last one 2017   several release's bilaterally   ULNAR NERVE TRANSPOSITION Bilateral age 89 (82)   Social History   Occupational History   Occupation: disability  Tobacco Use   Smoking status: Never    Passive exposure: Never   Smokeless tobacco: Never  Vaping Use   Vaping status: Never Used  Substance and Sexual Activity   Alcohol use: Yes    Alcohol/week: 0.0 standard drinks of alcohol    Comment: once every 3-4 months   Drug use: No   Sexual activity: Not Currently    Partners: Male    Birth control/protection: Post-menopausal    Comment: ablation

## 2023-08-18 DIAGNOSIS — G4733 Obstructive sleep apnea (adult) (pediatric): Secondary | ICD-10-CM | POA: Diagnosis not present

## 2023-08-18 NOTE — Progress Notes (Signed)
 @Patient  ID: [name], female    DOB: [birth year], 68 y.o.   MRN: [id number]  No chief complaint on file.   Referring provider: Joshua Debby CROME, MD  HPI: 68 year old female. Patient with moderate obstructive sleep apnea, intolerant of CPAP. Patient of Dr. Neda.    Previous [facility] pulmonary encounter: 05/18/23- Dr. Neda    In for inspire device activation today   Had inspire device implanted 03/09/2023 Sleep study with moderate obstructive sleep apnea   Implantation went well Has been doing well   Some difficulty initiating sleep, some difficulty maintaining sleep Usually able to fall asleep after about 30 minutes to an hour Will wake up a few times during the night sometimes gets up to get herself going on sometimes takes her to better tolerate   Denies any morning headaches No significant memory issues   Health has been relatively good   07/11/2023 Discussed the use of AI scribe software for clinical note transcription with the patient, who gave verbal consent to proceed.  History of Present Illness   [name] is a 68 year old female who presents for follow-up regarding her Inspire device.  She had her Inspire device activated in April 2025 and has experienced an improvement in sleep quality. However, she wakes up more than once a night and sometimes pauses the device when she gets up to use the bathroom or eat a small snack. Occasionally, she underestimates the time she will be awake and finds the device off when she wakes up in the morning.  She is currently using the remote to manage the device and is not utilizing the app on her phone. She has been using the device consistently, averaging over eight hours of use per night. She has been increasing the device level weekly, although she experienced difficulty swallowing at level five, which led her to remain at that level for three weeks. She is currently at level six (1.3V) and uses the device 100% of the time  during sleep.  The device occasionally moves if she sleeps on her side, causing it to protrude, and she has to reposition it. No pain or speech impediment when the device is off, but when it is on, it can affect her speech, making her sound impaired if she talks while it is active. Schedule procedure on July 1st with Dr. Carlie to secure the device.  She uses a sound machine with a setting called 'peace' to help her fall asleep, typically taking about sixty minutes to do so. She sometimes sleeps longer than nine hours but usually has to get up for daily activities. She also uses the device during naps.      Sensation voltage of 0.6 Functional voltage of 0.8 Therapy duration of 8 hours Activation delay of 1 hour Pause of 15 minutes    08/19/2023 Discussed the use of AI scribe software for clinical note transcription with the patient, who gave verbal consent to proceed.  History of Present Illness   [name] is a 68 year old female with obstructive sleep apnea who presents for a follow-up visit regarding her Inspire device and recent surgical procedure.  She underwent a procedure to secure Inspire device a week ago Tuesday, involving the placement of a mesh expected to disintegrate over time. She still experiences some residual pain from the procedure, which has improved over the past week.  She uses the Inspire device nightly, averaging eight-nine hours of use per night. She pauses  the device approximately one and a half times per night, often when getting up to use the restroom. She wakes up feeling more rested and has no episodes of waking up gasping or choking. She is currently using the device at level eleven, the maximum setting, without issues.  She experiences some soreness and pain in the neck following the last surgery, which resolved after about a week. She has interstitial cystitis and takes medication for it, but it continues to cause her to wake up at night to use the  restroom. No discomfort or pain from the [facility] device itself. She is able to fall asleep within the one-hour activation delay of the device and finds the thirty-minute pause duration adequate.      Inspire Sleep Sync report 07/20/23-08/18/23 Usage days 30/30 days > 4 hours Average usage 8 hours 45 mins Average pauses per night 1.7 Amplitude 1.8V (level 11)  Therapy duration of 9 hours Activation delay of 1 hour Pause of 30 minute Allergies  Allergen Reactions   Emetrol Itching   Indomethacin Other (See Comments)    Muscle spasms Causes muscle spasms in neck   Iodinated Contrast Media Itching    Flushed and Fever, itch all over   Amlodipine  Swelling    Peripheral edema   Carbamazepine  Other (See Comments)    Easy and unusual bleeding    Pseudoephedrine Other (See Comments)    I fly off the walls  CANNOT SLEEP   Risperidone And Paliperidone Other (See Comments)    Stomach upset, insomnia, drooling, tremors, jerks, sensitivity to touch.    Bupivacaine Hives   Pentazocine Other (See Comments)    Unknown reaction MADE ME LACTATE   Propoxyphene Itching and Nausea And Vomiting    darvocet   Sulfa Antibiotics Other (See Comments)    Unknown reaction   Tramadol  Other (See Comments)    Patient can not remember   Aspirin Other (See Comments)    upset stomach, ringing in the ears   Codeine Itching and Other (See Comments)    Anything related to codeine   Etodolac Rash and Other (See Comments)   Propranolol Nausea Only and Other (See Comments)    Immunization History  Administered Date(s) Administered   Fluad Quad(high Dose 65+) 10/29/2021   Fluad Trivalent(High Dose 65+) 10/08/2022   Influenza Split 02/09/2012   Influenza Whole 12/08/2006, 12/01/2007   Influenza,inj,Quad PF,6+ Mos 12/11/2013, 11/21/2014, 10/13/2016, 10/24/2017, 10/31/2018, 11/08/2019   Influenza-Unspecified 12/01/2015, 10/21/2020   Moderna Covid-19 Vaccine Bivalent Booster 64yrs & up 12/29/2020    Moderna Sars-Covid-2 Vaccination 04/28/2019, 05/29/2019, 01/26/2020   PNEUMOCOCCAL CONJUGATE-20 03/09/2021   Pfizer(Comirnaty)Fall Seasonal Vaccine 12 years and older 11/04/2022   Pneumococcal Polysaccharide-23 12/12/2013   Td 10/31/2015   Tdap 04/16/2011, 06/25/2013, 08/25/2020   Zoster Recombinant(Shingrix) 05/30/2018, 08/16/2018    Past Medical History:  Diagnosis Date   Allergic rhinitis    Anxiety    Benign essential tremor    hands   Bipolar 1 disorder, mixed, moderate (HCC)    Claustrophobia    Depression    Diabetes (HCC)    Eczema    Frequency of urination    GERD (gastroesophageal reflux disease)    History of frequent urinary tract infections    History of gastroesophageal reflux (GERD)    05-25-2017  per pt no issues since hiatal hernia repair 01/ 2018   History of hiatal hernia    History of melanoma excision    early 2000s-- BACK   History of panic attacks  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   Hypothyroidism    Intermittent palpitations    cardiology--  dr [place]   Interstitial cystitis    Limited jaw range of motion    s/p  bilateral TMJ surgery,  age 61s   Migraines    Moderate persistent asthma    pulmologist-- dr shellia   OA (osteoarthritis)    both knees   OSA on    per last sleep study 09/ 2017  mild OSA, AHI13/hr   PONV (postoperative nausea and vomiting)    PVC (premature ventricular contraction)    Rosacea    Sensation of pressure in bladder area    per pt intermittant   TMJ arthralgia    Urticaria    Wears glasses    White coat syndrome without hypertension    05-25-2017  per pt hx hypertension yrs ago, no issues after quitting stressful job    Tobacco History: Social History   Tobacco Use  Smoking Status Never   Passive exposure: Never  Smokeless Tobacco Never   Counseling given: Not Answered   Outpatient Medications Prior to Visit  Medication Sig Dispense Refill   albuterol  (VENTOLIN  HFA) 108 (90  Base) MCG/ACT inhaler INHALE 1-2 PUFFS INTO THE LUNGS DAILY AS NEEDED FOR WHEEZING OR SHORTNESS OF BREATH. 18 each 1   Azelastine  HCl 137 MCG/SPRAY SOLN Place 2 sprays into the nose 2 (two) times daily. 90 mL 3   Carbinoxamine  Maleate 4 MG TABS Take 1 tablet (4 mg total) by mouth every 6 (six) hours. 360 tablet 1   cariprazine (VRAYLAR) 3 MG capsule 4.5 mg.     clonazePAM  (KLONOPIN ) 1 MG tablet Take 1 mg by mouth daily as needed.     dicyclomine  (BENTYL ) 10 MG capsule TAKE 1 CAPSULE (10 MG TOTAL) BY MOUTH 3 (THREE) TIMES DAILY BEFORE MEALS. 270 capsule 1   DULoxetine (CYMBALTA) 60 MG capsule Take 60 mg by mouth 2 (two) times daily at 10 AM and 5 PM.     fluticasone  (FLONASE ) 50 MCG/ACT nasal spray PLACE 1 SPRAY INTO BOTH NOSTRILS 2 (TWO) TIMES DAILY AS NEEDED FOR ALLERGIES OR RHINITIS. 48 mL 1   HYDROcodone -acetaminophen  (NORCO/VICODIN) 5-325 MG tablet Take 1-2 tablets by mouth every 6 (six) hours as needed. 12 tablet 0   JARDIANCE  10 MG TABS tablet TAKE 1 TABLET BY MOUTH DAILY BEFORE BREAKFAST. 90 tablet 1   levothyroxine  (SYNTHROID ) 25 MCG tablet Take 1 tablet (25 mcg total) by mouth daily. 90 tablet 0   metoprolol  succinate (TOPROL -XL) 25 MG 24 hr tablet TAKE 1 TABLET (25 MG TOTAL) BY MOUTH DAILY. 90 tablet 0   montelukast  (SINGULAIR ) 10 MG tablet Take 1 tablet (10 mg total) by mouth at bedtime. 90 tablet 3   MYRBETRIQ 50 MG TB24 tablet Take 50 mg by mouth daily.     NURTEC 75 MG TBDP TAKE 1 TABLET BY MOUTH EVERY OTHER DAY 40 tablet 2   promethazine  (PHENERGAN ) 12.5 MG tablet Take 1 tablet (12.5 mg total) by mouth every 6 (six) hours as needed for nausea or vomiting. 30 tablet 0   rosuvastatin  (CRESTOR ) 20 MG tablet TAKE 1 TABLET BY MOUTH EVERY DAY 90 tablet 0   spironolactone  (ALDACTONE ) 25 MG tablet TAKE 1 TABLET (25 MG TOTAL) BY MOUTH DAILY. 90 tablet 0   tiZANidine  (ZANAFLEX ) 2 MG tablet TAKE 1 TABLET (2MG  TOTAL) BY MOUTH TWICE A DAY AS NEEDED FOR MUSCLE SPASM 180 tablet 1  trihexyphenidyl   (ARTANE ) 2 MG tablet TAKE 1 TABLET BY MOUTH EVERY DAY 90 tablet 0   zinc  gluconate 50 MG tablet Take 50 mg by mouth daily.     Facility-Administered Medications Prior to Visit  Medication Dose Route Frequency Provider Last Rate Last Admin   denosumab  (PROLIA ) injection 60 mg  60 mg Subcutaneous Once Joshua Debby CROME, MD       denosumab  (PROLIA ) injection 60 mg  60 mg Subcutaneous Q6 months Joshua Debby CROME, MD          Review of Systems  Review of Systems  Constitutional: Negative.   HENT: Negative.    Respiratory: Negative.    Psychiatric/Behavioral:  Positive for sleep disturbance.      Physical Exam  LMP 03/05/2012 (Approximate)  Physical Exam Constitutional:      General: She is not in acute distress.    Appearance: Normal appearance. She is not ill-appearing.  HENT:     Head: Normocephalic and atraumatic.     Mouth/Throat:     Mouth: Mucous membranes are moist.     Pharynx: Oropharynx is clear.  Cardiovascular:     Rate and Rhythm: Normal rate and regular rhythm.  Pulmonary:     Effort: Pulmonary effort is normal.     Breath sounds: Normal breath sounds.  Musculoskeletal:        General: Normal range of motion.     Cervical back: Normal range of motion and neck supple.  Skin:    General: Skin is warm and dry.     Comments: Right chest wall inspire inc, OTA with some minor localized swelling and erythema  Neurological:     General: No focal deficit present.     Mental Status: She is alert and oriented to person, place, and time. Mental status is at baseline.  Psychiatric:        Mood and Affect: Mood normal.        Behavior: Behavior normal.        Thought Content: Thought content normal.        Judgment: Judgment normal.      Lab Results:  CBC    Component Value Date/Time   WBC 11.4 (H) 06/21/2023 1358   RBC 5.02 06/21/2023 1358   HGB 14.7 06/21/2023 1358   HCT 44.8 06/21/2023 1358   PLT 238.0 06/21/2023 1358   MCV 89.1 06/21/2023 1358   MCH 28.3  10/08/2022 1625   MCHC 32.9 06/21/2023 1358   RDW 14.4 06/21/2023 1358   LYMPHSABS 2.7 06/21/2023 1358   MONOABS 0.9 06/21/2023 1358   EOSABS 0.1 06/21/2023 1358   BASOSABS 0.1 06/21/2023 1358    BMET    Component Value Date/Time   NA 138 08/05/2023 1130   K 4.2 08/05/2023 1130   CL 105 08/05/2023 1130   CO2 22 08/05/2023 1130   GLUCOSE 122 (H) 08/05/2023 1130   BUN 12 08/05/2023 1130   CREATININE 1.00 08/05/2023 1130   CREATININE 1.14 (H) 10/08/2022 1625   CALCIUM  9.6 08/05/2023 1130   GFRNONAA >60 08/05/2023 1130   GFRNONAA 62 11/21/2014 0959   GFRAA >60 01/23/2018 0550   GFRAA 72 11/21/2014 0959    BNP No results found for: BNP  ProBNP    Component Value Date/Time   PROBNP 46.0 03/12/2020 1516    Imaging: MM 3D SCREENING MAMMOGRAM BILATERAL BREAST Result Date: 07/27/2023 CLINICAL DATA:  Screening. EXAM: DIGITAL SCREENING BILATERAL MAMMOGRAM WITH TOMOSYNTHESIS AND CAD TECHNIQUE: Bilateral screening digital craniocaudal and mediolateral  oblique mammograms were obtained. Bilateral screening digital breast tomosynthesis was performed. The images were evaluated with computer-aided detection. COMPARISON:  Previous exam(s). ACR Breast Density Category c: The breasts are heterogeneously dense, which may obscure small masses. FINDINGS: There are no findings suspicious for malignancy. IMPRESSION: No mammographic evidence of malignancy. A result letter of this screening mammogram will be mailed directly to the patient. RECOMMENDATION: Screening mammogram in one year. (Code:SM-B-01Y) BI-RADS CATEGORY  1: Negative. Electronically Signed   By: Toribio Agreste M.D.   On: 07/27/2023 14:18     Assessment & Plan:   1. OSA (obstructive sleep apnea) (Primary) - Inspire Titration; Future  Assessment and Plan    Obstructive sleep apnea Obstructive sleep apnea managed with Inspire device. Patient is using Inspire device 100% >4 hours over the last 30 days. Reports improved sleep quality  but experiences occasional awakenings due to interstitial cystitis. Underwent Inspire revision placement by Dr. Carlie 1 week ago, no in a sutured pouch to help stabilize. Pain has significantly improved and is not severe. No pain during tongue protrusion test. Current level is 11 (1.9V). - Run a waveform for three minutes to assess device function. - Increase therapy duration to ten hours to ensure device remains on until morning. - Order INSPIRE titration study to assess for residual apneas and determine optimal settings. - Advise to bring remote to the titration study. - Allow adjustment of device level if discomfort occurs before the sleep study.   Diane LELON Ferrari, NP 08/18/2023

## 2023-08-19 ENCOUNTER — Ambulatory Visit: Admitting: Primary Care

## 2023-08-19 VITALS — BP 126/74 | HR 79 | Temp 97.6°F | Ht 59.0 in | Wt 141.2 lb

## 2023-08-19 DIAGNOSIS — G4733 Obstructive sleep apnea (adult) (pediatric): Secondary | ICD-10-CM | POA: Diagnosis not present

## 2023-08-19 NOTE — Patient Instructions (Signed)
  VISIT SUMMARY: Today, we discussed your recent procedure and the use of your Inspire device for obstructive sleep apnea. You reported some residual pain from the procedure, which has been improving, and shared that you are using the Inspire device nightly with good results. We also reviewed your interstitial cystitis and its impact on your sleep.  YOUR PLAN: -POST-PROCEDURAL PAIN: You have some residual pain from your recent procedure involving mesh placement, but it has significantly improved. We increased the therapy duration of your Inspire device to ten hours to ensure it remains on until morning. You should manually turn off the device in the morning to prevent missing any events. We will also schedule an Inspire titration study to assess for any remaining sleep apnea events and determine the best settings for your device. Please bring your remote to the study, and you may adjust the device level if you experience discomfort before the sleep study.  -INTERSTITIAL CYSTITIS: Interstitial cystitis is a chronic bladder condition that causes pain and frequent urination. You are currently managing it with medication, but it continues to cause you to wake up at night. There has been no improvement in this issue since using the [facility] device.  INSTRUCTIONS: Please follow up for the Inspire titration study as scheduled and bring your remote to the study. Continue to use your medication for interstitial cystitis as prescribed.

## 2023-08-25 ENCOUNTER — Encounter (HOSPITAL_BASED_OUTPATIENT_CLINIC_OR_DEPARTMENT_OTHER): Payer: Self-pay | Admitting: Otolaryngology

## 2023-08-31 ENCOUNTER — Other Ambulatory Visit: Payer: Self-pay | Admitting: Pulmonary Disease

## 2023-08-31 DIAGNOSIS — G4733 Obstructive sleep apnea (adult) (pediatric): Secondary | ICD-10-CM

## 2023-09-06 DIAGNOSIS — F3132 Bipolar disorder, current episode depressed, moderate: Secondary | ICD-10-CM | POA: Diagnosis not present

## 2023-09-06 DIAGNOSIS — F411 Generalized anxiety disorder: Secondary | ICD-10-CM | POA: Diagnosis not present

## 2023-09-06 DIAGNOSIS — F410 Panic disorder [episodic paroxysmal anxiety] without agoraphobia: Secondary | ICD-10-CM | POA: Diagnosis not present

## 2023-09-07 ENCOUNTER — Ambulatory Visit (INDEPENDENT_AMBULATORY_CARE_PROVIDER_SITE_OTHER)

## 2023-09-07 DIAGNOSIS — E538 Deficiency of other specified B group vitamins: Secondary | ICD-10-CM

## 2023-09-07 MED ORDER — CYANOCOBALAMIN 1000 MCG/ML IJ SOLN
1000.0000 ug | Freq: Once | INTRAMUSCULAR | Status: AC
  Administered 2023-09-07: 1000 ug via INTRAMUSCULAR

## 2023-09-07 NOTE — Progress Notes (Signed)
Pt here for monthly B12 injection per Dr. Yetta Barre  B12 given IM. and pt tolerated injection well.

## 2023-09-08 ENCOUNTER — Ambulatory Visit: Payer: Self-pay | Admitting: Primary Care

## 2023-09-08 NOTE — Telephone Encounter (Signed)
 FYI Only or Action Required?: FYI only for provider.  Patient is followed in Pulmonology for sleep apnea and asthma, last seen on 08/19/2023 by Hope Almarie ORN, NP.  Called Nurse Triage reporting Device Malfunction.  Symptoms began today.  Triage Disposition: Call PCP Now  Patient/caregiver understands and will follow disposition?: Yes    Copied from CRM #[id number]. Topic: Clinical - Red Word Triage >> Sep 08, 2023 10:36 AM Rozanna MATSU wrote: Red Word that prompted transfer to Nurse Triage: PT STATED SHE WOKE UP AND HER INSPIRE DEVICE MOVED. STATED IT WAS STRAIGHT UPSIDE WAYS.    Reason for Disposition  Nursing judgment or information in reference  Answer Assessment - Initial Assessment Questions Contacted pulmonary office and spoke with inspire rep who will call the patient back.    Patient denies any new symptoms with her device moving.    1. REASON FOR CALL: What is your main concern right now?     Inspire device is standing straight up 2. ONSET: When did the this start?     Today  Protocols used: No Guideline Available-A-AH

## 2023-09-08 NOTE — Telephone Encounter (Signed)
 Yes and routed to the provider to make aware.  Please route. Will address with [id number]

## 2023-09-09 NOTE — Telephone Encounter (Signed)
 She needs to reach out to Dr. Carlie whom did her revision surgery recently

## 2023-09-09 NOTE — Telephone Encounter (Signed)
 I called and spoke to pt. Pt informed of Beth's note and verbalized understanding. Pt states she has reached out to that office yesterday and this morning and is waiting to hear back. NFN

## 2023-09-12 ENCOUNTER — Other Ambulatory Visit: Payer: Self-pay | Admitting: Internal Medicine

## 2023-09-12 DIAGNOSIS — G4733 Obstructive sleep apnea (adult) (pediatric): Secondary | ICD-10-CM | POA: Diagnosis not present

## 2023-09-12 DIAGNOSIS — M810 Age-related osteoporosis without current pathological fracture: Secondary | ICD-10-CM

## 2023-09-14 ENCOUNTER — Telehealth: Payer: Self-pay | Admitting: Pulmonary Disease

## 2023-09-14 NOTE — Telephone Encounter (Signed)
 Hey Dr. Neda, Patient is scheduled for inspire titration and was ordered by [facility]. I see that Polysomnography 4 or more parameters has been ordered. Does the patient still need the Poly scheduled or should this be canceled? Thanks!

## 2023-09-14 NOTE — Telephone Encounter (Signed)
 Last Prolia  inj 07/06/23 Next Prolia  inj due 01/07/24

## 2023-09-15 ENCOUNTER — Other Ambulatory Visit: Payer: Self-pay | Admitting: Allergy & Immunology

## 2023-09-15 NOTE — Telephone Encounter (Signed)
 Patient needs a titration study not just a polysomnogram  She should have a titration study done [id number], not at [id number]

## 2023-09-16 NOTE — Telephone Encounter (Signed)
 Order for Polysomnogram has been canceled.

## 2023-09-17 ENCOUNTER — Other Ambulatory Visit: Payer: Self-pay | Admitting: Internal Medicine

## 2023-09-17 DIAGNOSIS — R000 Tachycardia, unspecified: Secondary | ICD-10-CM

## 2023-09-17 DIAGNOSIS — I493 Ventricular premature depolarization: Secondary | ICD-10-CM

## 2023-09-19 ENCOUNTER — Other Ambulatory Visit: Payer: Self-pay | Admitting: Internal Medicine

## 2023-09-19 ENCOUNTER — Telehealth: Payer: Self-pay

## 2023-09-19 NOTE — Telephone Encounter (Signed)
 Copied from CRM [id number]. Topic: Clinical - Medication Question >> Sep 19, 2023 12:32 PM Viola F wrote: Reason for CRM: Patient insurance doesn't cover the Trihexyphenidyl  (ARTANE ) 2 MG tablet medication - it helps with her shakiness and wants to know if there's a alternative medication she can use or if she should see a neurologist?

## 2023-09-20 ENCOUNTER — Other Ambulatory Visit: Payer: Self-pay | Admitting: Internal Medicine

## 2023-09-20 DIAGNOSIS — T50905A Adverse effect of unspecified drugs, medicaments and biological substances, initial encounter: Secondary | ICD-10-CM

## 2023-09-20 MED ORDER — AUSTEDO XR 6 MG PO TB24: 1.0000 | ORAL_TABLET | Freq: Every day | ORAL | 0 refills | Status: DC

## 2023-09-27 DIAGNOSIS — H43813 Vitreous degeneration, bilateral: Secondary | ICD-10-CM | POA: Diagnosis not present

## 2023-09-27 DIAGNOSIS — H2513 Age-related nuclear cataract, bilateral: Secondary | ICD-10-CM | POA: Diagnosis not present

## 2023-09-27 DIAGNOSIS — H532 Diplopia: Secondary | ICD-10-CM | POA: Diagnosis not present

## 2023-09-29 ENCOUNTER — Ambulatory Visit (HOSPITAL_BASED_OUTPATIENT_CLINIC_OR_DEPARTMENT_OTHER)
Admission: RE | Admit: 2023-09-29 | Discharge: 2023-09-29 | Disposition: A | Discharge status: Home / Self Care | Source: Ambulatory Visit | Attending: Family Medicine | Admitting: Family Medicine

## 2023-09-29 DIAGNOSIS — Z780 Asymptomatic menopausal state: Secondary | ICD-10-CM | POA: Insufficient documentation

## 2023-10-03 ENCOUNTER — Ambulatory Visit: Payer: Self-pay | Admitting: Family Medicine

## 2023-10-03 ENCOUNTER — Ambulatory Visit (HOSPITAL_BASED_OUTPATIENT_CLINIC_OR_DEPARTMENT_OTHER): Attending: Primary Care | Admitting: Pulmonary Disease

## 2023-10-03 DIAGNOSIS — F411 Generalized anxiety disorder: Secondary | ICD-10-CM | POA: Diagnosis not present

## 2023-10-03 DIAGNOSIS — G4733 Obstructive sleep apnea (adult) (pediatric): Secondary | ICD-10-CM | POA: Insufficient documentation

## 2023-10-03 DIAGNOSIS — F410 Panic disorder [episodic paroxysmal anxiety] without agoraphobia: Secondary | ICD-10-CM | POA: Diagnosis not present

## 2023-10-03 DIAGNOSIS — F3132 Bipolar disorder, current episode depressed, moderate: Secondary | ICD-10-CM | POA: Diagnosis not present

## 2023-10-03 DIAGNOSIS — Z9682 Presence of neurostimulator: Secondary | ICD-10-CM | POA: Diagnosis not present

## 2023-10-06 ENCOUNTER — Ambulatory Visit

## 2023-10-06 DIAGNOSIS — E538 Deficiency of other specified B group vitamins: Secondary | ICD-10-CM | POA: Diagnosis not present

## 2023-10-06 MED ORDER — CYANOCOBALAMIN 1000 MCG/ML IJ SOLN
1000.0000 ug | Freq: Once | INTRAMUSCULAR | Status: AC
  Administered 2023-10-06: 1000 ug via INTRAMUSCULAR

## 2023-10-06 NOTE — Progress Notes (Signed)
 Patient visits today to receive her B12 injection/vaccine. Patient was informed and tolerated well. Patient was notified to reach out to [place] if needed.

## 2023-10-07 ENCOUNTER — Other Ambulatory Visit: Payer: Self-pay | Admitting: Internal Medicine

## 2023-10-11 ENCOUNTER — Other Ambulatory Visit: Payer: Self-pay | Admitting: Internal Medicine

## 2023-10-11 DIAGNOSIS — I10 Essential (primary) hypertension: Secondary | ICD-10-CM

## 2023-10-14 ENCOUNTER — Ambulatory Visit: Admitting: Primary Care

## 2023-10-14 NOTE — Procedures (Signed)
 [facility] [address] [place], [place] [address] Tel: [phone]   Fax: [phone]  Inspire Interpretation  Patient Name:  [name], [name] Date:  10/03/2023 Referring Physician:  ALMARIE FERRARI [id number]) %%startinterp%% Indications for Polysomnography The patient is a 68 year old Female who is 4' 11 and weighs 137.0 lbs. Her BMI equals 28.0.  A full night inspire treatment study was performed. Had inspire device implanted 03/09/2023    Patient reported taking her medication at 10:30 pm.  Quetiapine   Polysomnogram Data A full night polysomnogram recorded the standard physiologic parameters including EEG, EOG, EMG, EKG, nasal and oral airflow.  Respiratory parameters of chest and abdominal movements were recorded with Respiratory Inductance Plethysmography belts.  Oxygen  saturation was recorded by pulse oximetry.   Sleep Architecture The total recording time of the polysomnogram was 381.5 minutes.  The total sleep time was 102.0 minutes.  The patient spent 71.6% of total sleep time in Stage N1, 28.4% in Stage N2, 0.0% in Stages N3, and 0.0% in REM.  Sleep latency was 40.9 minutes.  REM latency was - minutes.  Sleep Efficiency was 26.7%.  Wake after Sleep Onset time was 238.5 minutes.  Inspire Summary The patient was on inspire therapy at levels ranging from 1.6* volts up to 1.8* volts.  The last level used in the study was 1.8* volts.  Respiratory Events The polysomnogram revealed a presence of - obstructive, - central, and - mixed apneas resulting in an Apnea index of - events per hour.  There were 12 hypopneas (>=3% desaturation and/or arousal) resulting in an Apnea\Hypopnea Index (AHI >=3% desaturation and/or arousal) of 7.1 events per hour.  There were 4 hypopneas (>=4% desaturation) resulting in an Apnea\Hypopnea Index (AHI >=4% desaturation) of 2.4 events per hour.  There were 37 Respiratory Effort Related Arousals resulting in a RERA  index of 21.8 events per hour. The Respiratory Disturbance Index is 28.8 events per hour.  The snore index was - events per hour.  Mean oxygen  saturation was 93.9%.  The lowest oxygen  saturation during sleep was 87.0%.  Time spent <=88% oxygen  saturation was 0.3 minutes (0.1%).  Limb Activity There were 4 limb movements recorded.  Of this total, 4 were classified as PLMs.  Of the PLMs, - were associated with arousals.  The Limb Movement index was 2.4 per hour while the PLM index was 2.4 per hour.  Cardiac Summary The average pulse rate was 75.7 bpm.  The minimum pulse rate was 58.0 bpm while the maximum pulse rate was 96.0 bpm.  Cardiac rhythm was normal/abnormal.  Comments: Sleep efficiency was very poor. She c/o Rt hip pain. Study was sub optimal No REM sleep noted . Minimal supine sleep at 1.7V  Diagnosis: OSA s/p hypoglossal nerve stimulator implant Therapeutic amplitude was obtained at 1.7 & 1.8V   Recommendations: Consider setting device at 1.7 V with range of 1.5-1.9 & follow clinically at this level   This study was personally reviewed and electronically signed by: Dr. Harden Staff Accredited Board Certified in Sleep Medicine

## 2023-10-21 ENCOUNTER — Ambulatory Visit: Admitting: Nurse Practitioner

## 2023-10-26 DIAGNOSIS — F3132 Bipolar disorder, current episode depressed, moderate: Secondary | ICD-10-CM | POA: Diagnosis not present

## 2023-10-28 ENCOUNTER — Encounter: Payer: Self-pay | Admitting: Adult Health

## 2023-10-28 ENCOUNTER — Ambulatory Visit: Admitting: Adult Health

## 2023-10-28 VITALS — BP 108/70 | HR 83 | Ht 59.0 in | Wt 137.0 lb

## 2023-10-28 DIAGNOSIS — G4733 Obstructive sleep apnea (adult) (pediatric): Secondary | ICD-10-CM | POA: Diagnosis not present

## 2023-10-28 NOTE — Patient Instructions (Signed)
 Continue to use INSPIRE each night.  We adjusted your INSPIRE level to Level 3 (1.7v)   Your range is Level 1-5 (1.5-1.9v)  Follow up with Dr. Carlie as planned  Follow up in 6 months and As needed

## 2023-10-28 NOTE — Progress Notes (Signed)
 @Patient  ID: [name], female    DOB: 1955-03-01, 68 y.o.   MRN: [id number]  Chief Complaint  Patient presents with   Sleep Apnea    Referring provider: Joshua Debby CROME, MD  HPI: 68 year old female followed for moderate obstructive sleep apnea that is CPAP intolerant status post inspire implantation  TEST/EVENTS :  03/09/23 INSPIRE Implant  05/18/23 INSPIRE Activation  08/09/23 chest wall pocket revision  10/03/23 INSPIRE titration   10/28/2023 Follow up : OSA-INSPIRE  Discussed the use of AI scribe software for clinical note transcription with the patient, who gave verbal consent to proceed.  History of Present Illness [name] is a 68 year old female with sleep apnea who presents for a follow-up after a recent sleep titration study for her Inspire device.  She has a history of sleep apnea and previously attempted CPAP therapy, which was unsuccessful. She subsequently received an Inspire device. Her recent sleep titration study was performed October 03, 2023 , showed optimal setting at 1.7 V with a range at 1.5 to 1.9 V.  Her oxygen  levels remained stable during the study.  Stimulation test today in the office shows good tongue movement..  She was placed on level 3 at 1.7 V.  She experienced discomfort due to a bad hip during the sleep study, . She is currently using the Inspire device nightly, except for one night when she forgot to turn it on while [place] the beach.  She has experienced issues with the Inspire device pocket, which initially did not adhere well and required a follow-up procedure. She is currently wearing an ace wrap at night and a different bra during the day to help the device adhere to the tissue.  Has upcoming follow-up with ENT.  She feels more rested since using the Inspire device and is satisfied with the improvement in her sleep quality.  Patient did obtain a new phone.  We synced her phone and remote today in the office  Inspire compliance report  showed excellent usage with daily average usage at 9.5 hours.  Current level at 1.8 V.      Allergies  Allergen Reactions   Emetrol Itching   Indomethacin Other (See Comments)    Muscle spasms Causes muscle spasms in neck   Iodinated Contrast Media Itching    Flushed and Fever, itch all over   Amlodipine  Swelling    Peripheral edema   Carbamazepine  Other (See Comments)    Easy and unusual bleeding    Pseudoephedrine Other (See Comments)    I fly off the walls  CANNOT SLEEP   Risperidone And Paliperidone Other (See Comments)    Stomach upset, insomnia, drooling, tremors, jerks, sensitivity to touch.    Bupivacaine Hives   Pentazocine Other (See Comments)    Unknown reaction MADE ME LACTATE   Propoxyphene Itching and Nausea And Vomiting    darvocet   Sulfa Antibiotics Other (See Comments)    Unknown reaction   Tramadol  Other (See Comments)    Patient can not remember   Aspirin Other (See Comments)    upset stomach, ringing in the ears   Codeine Itching and Other (See Comments)    Anything related to codeine   Etodolac Rash and Other (See Comments)   Propranolol Nausea Only and Other (See Comments)    Immunization History  Administered Date(s) Administered   Fluad Quad(high Dose 65+) 10/29/2021   Fluad Trivalent(High Dose 65+) 10/08/2022   INFLUENZA, HIGH DOSE SEASONAL PF 10/03/2023  Influenza Split 02/09/2012   Influenza Whole 12/08/2006, 12/01/2007   Influenza,inj,Quad PF,6+ Mos 12/11/2013, 11/21/2014, 10/13/2016, 10/24/2017, 10/31/2018, 11/08/2019   Influenza-Unspecified 12/01/2015, 10/21/2020   Moderna Covid-19 Vaccine Bivalent Booster 56yrs & up 12/29/2020   Moderna Sars-Covid-2 Vaccination 04/28/2019, 05/29/2019, 01/26/2020   PNEUMOCOCCAL CONJUGATE-20 03/09/2021, 10/03/2023   Pneumococcal Polysaccharide-23 12/12/2013   Td 10/31/2015   Tdap 04/16/2011, 06/25/2013, 08/25/2020   Zoster Recombinant(Shingrix) 05/30/2018, 08/16/2018    Past Medical  History:  Diagnosis Date   Allergic rhinitis    Anxiety    Benign essential tremor    hands   Bipolar 1 disorder, mixed, moderate (HCC)    Claustrophobia    Depression    Diabetes (HCC)    Eczema    Frequency of urination    GERD (gastroesophageal reflux disease)    History of frequent urinary tract infections    History of gastroesophageal reflux (GERD)    05-25-2017  per pt no issues since hiatal hernia repair 01/ 2018   History of hiatal hernia    History of melanoma excision    early 2000s-- BACK   History of panic attacks    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   Hypothyroidism    Intermittent palpitations    cardiology--  dr [place]   Interstitial cystitis    Limited jaw range of motion    s/p  bilateral TMJ surgery,  age 25s   Migraines    Moderate persistent asthma    pulmologist-- dr shellia   OA (osteoarthritis)    both knees   OSA on    per last sleep study 09/ 2017  mild OSA, AHI13/hr   PONV (postoperative nausea and vomiting)    PVC (premature ventricular contraction)    Rosacea    Sensation of pressure in bladder area    per pt intermittant   TMJ arthralgia    Urticaria    Wears glasses    White coat syndrome without hypertension    05-25-2017  per pt hx hypertension yrs ago, no issues after quitting stressful job    Tobacco History: Social History   Tobacco Use  Smoking Status Never   Passive exposure: Never  Smokeless Tobacco Never   Counseling given: Not Answered   Outpatient Medications Prior to Visit  Medication Sig Dispense Refill   albuterol  (VENTOLIN  HFA) 108 (90 Base) MCG/ACT inhaler INHALE 1-2 PUFFS INTO THE LUNGS DAILY AS NEEDED FOR WHEEZING OR SHORTNESS OF BREATH. 18 each 1   Azelastine  HCl 137 MCG/SPRAY SOLN Place 2 sprays into the nose 2 (two) times daily. 90 mL 3   Carbinoxamine  Maleate 4 MG TABS Take 1 tablet (4 mg total) by mouth every 6 (six) hours. 360 tablet 1   cariprazine (VRAYLAR) 3 MG capsule 4.5  mg.     clonazePAM  (KLONOPIN ) 1 MG tablet Take 1 mg by mouth daily as needed.     [START ON 01/07/2024] denosumab  (PROLIA ) 60 MG/ML SOSY injection INJECT 1 SYRINGE UNDER THE SKIN ONCE EVERY 6 MONTHS 1 mL 0   Deutetrabenazine  ER (AUSTEDO  XR) 6 MG TB24 Take 1 tablet by mouth daily. 30 tablet 0   dicyclomine  (BENTYL ) 10 MG capsule TAKE 1 CAPSULE (10 MG TOTAL) BY MOUTH 3 (THREE) TIMES DAILY BEFORE MEALS. 270 capsule 1   DULoxetine (CYMBALTA) 60 MG capsule Take 60 mg by mouth 2 (two) times daily at 10 AM and 5 PM.     fluticasone  (FLONASE ) 50 MCG/ACT nasal spray PLACE 1 SPRAY  INTO BOTH NOSTRILS 2 (TWO) TIMES DAILY AS NEEDED FOR ALLERGIES OR RHINITIS. 48 mL 1   HYDROcodone -acetaminophen  (NORCO/VICODIN) 5-325 MG tablet Take 1-2 tablets by mouth every 6 (six) hours as needed. 12 tablet 0   JARDIANCE  10 MG TABS tablet TAKE 1 TABLET BY MOUTH DAILY BEFORE BREAKFAST. 90 tablet 1   levothyroxine  (SYNTHROID ) 25 MCG tablet Take 1 tablet (25 mcg total) by mouth daily. 90 tablet 0   metoprolol  succinate (TOPROL -XL) 25 MG 24 hr tablet TAKE 1 TABLET (25 MG TOTAL) BY MOUTH DAILY. 90 tablet 0   montelukast  (SINGULAIR ) 10 MG tablet Take 1 tablet (10 mg total) by mouth at bedtime. 90 tablet 3   MYRBETRIQ 50 MG TB24 tablet Take 50 mg by mouth daily.     NURTEC 75 MG TBDP TAKE 1 TABLET BY MOUTH EVERY OTHER DAY 40 tablet 2   promethazine  (PHENERGAN ) 12.5 MG tablet Take 1 tablet (12.5 mg total) by mouth every 6 (six) hours as needed for nausea or vomiting. 30 tablet 0   rosuvastatin  (CRESTOR ) 20 MG tablet TAKE 1 TABLET BY MOUTH EVERY DAY 90 tablet 0   spironolactone  (ALDACTONE ) 25 MG tablet TAKE 1 TABLET (25 MG TOTAL) BY MOUTH DAILY. 90 tablet 0   tiZANidine  (ZANAFLEX ) 2 MG tablet TAKE 1 TABLET (2MG  TOTAL) BY MOUTH TWICE A DAY AS NEEDED FOR MUSCLE SPASM 180 tablet 1   zinc  gluconate 50 MG tablet Take 50 mg by mouth daily.     trihexyphenidyl  (ARTANE ) 2 MG tablet TAKE 1 TABLET BY MOUTH EVERY DAY 90 tablet 0    Facility-Administered Medications Prior to Visit  Medication Dose Route Frequency Provider Last Rate Last Admin   denosumab  (PROLIA ) injection 60 mg  60 mg Subcutaneous Once Joshua Debby CROME, MD       denosumab  (PROLIA ) injection 60 mg  60 mg Subcutaneous Q6 months Joshua Debby CROME, MD         Review of Systems:   Constitutional:   No  weight loss, night sweats,  Fevers, chills, fatigue, or  lassitude.  HEENT:   No headaches,  Difficulty swallowing,  Tooth/dental problems, or  Sore throat,                No sneezing, itching, ear ache, nasal congestion, post nasal drip,   CV:  No chest pain,  Orthopnea, PND, swelling in lower extremities, anasarca, dizziness, palpitations, syncope.   GI  No heartburn, indigestion, abdominal pain, nausea, vomiting, diarrhea, change in bowel habits, loss of appetite, bloody stools.   Resp: No shortness of breath with exertion or at rest.  No excess mucus, no productive cough,  No non-productive cough,  No coughing up of blood.  No change in color of mucus.  No wheezing.  No chest wall deformity  Skin: no rash or lesions.  GU: no dysuria, change in color of urine, no urgency or frequency.  No flank pain, no hematuria   MS:  + hip pain    Physical Exam  BP 108/70 (BP Location: Left Arm, Patient Position: Sitting, Cuff Size: Normal)   Pulse 83   Ht 4' 11 (1.499 m)   Wt 137 lb (62.1 kg)   LMP 03/05/2012 (Approximate)   SpO2 94% Comment: RA  BMI 27.67 kg/m   GEN: A/Ox3; pleasant , NAD, well nourished    HEENT:  Leon/AT,  NOSE-clear, THROAT-clear, no lesions, no postnasal drip or exudate noted.   NECK:  Supple w/ fair ROM; no JVD; normal carotid impulses w/o  bruits; no thyromegaly or nodules palpated; no lymphadenopathy.    RESP  Clear  P & A; w/o, wheezes/ rales/ or rhonchi. no accessory muscle use, no dullness to percussion  CARD:  RRR, no m/r/g, no peripheral edema, pulses intact, no cyanosis or clubbing.  GI:   Soft & nt; nml bowel  sounds; no organomegaly or masses detected.   Musco: Warm bil, no deformities or joint swelling noted.   Neuro: alert, no focal deficits noted.    Skin: Warm, no lesions or rashes, INSPIRE surgical site appears well healed.    Lab Results:   BNP No results found for: BNP    Imaging: Inspire Titration Result Date: 10/03/2023 Jude Harden GAILS, MD     10/14/2023  6:17 AM [facility] [address] [place], [place] [address] Tel: [phone]   Fax: [phone] Inspire Interpretation Patient Name:  DESERI, LOSS Date:  10/03/2023 Referring Physician:  ALMARIE FERRARI [phone]) %%startinterp%% Indications for Polysomnography The patient is a 68 year old Female who is 4' 11 and weighs 137.0 lbs. Her BMI equals 28.0.  A full night inspire treatment study was performed. Had inspire device implanted 03/09/2023 Patient reported taking her medication at 10:30 pm. Quetiapine Polysomnogram Data A full night polysomnogram recorded the standard physiologic parameters including EEG, EOG, EMG, EKG, nasal and oral airflow.  Respiratory parameters of chest and abdominal movements were recorded with Respiratory Inductance Plethysmography belts.  Oxygen  saturation was recorded by pulse oximetry. Sleep Architecture The total recording time of the polysomnogram was 381.5 minutes.  The total sleep time was 102.0 minutes.  The patient spent 71.6% of total sleep time in Stage N1, 28.4% in Stage N2, 0.0% in Stages N3, and 0.0% in REM.  Sleep latency was 40.9 minutes.  REM latency was - minutes.  Sleep Efficiency was 26.7%.  Wake after Sleep Onset time was 238.5 minutes. Inspire Summary The patient was on inspire therapy at levels ranging from 1.6* volts up to 1.8* volts.  The last level used in the study was 1.8* volts. Respiratory Events The polysomnogram revealed a presence of - obstructive, - central, and - mixed apneas resulting in an Apnea index of - events per hour.  There were  12 hypopneas (>=3% desaturation and/or arousal) resulting in an Apnea\Hypopnea Index (AHI >=3% desaturation and/or arousal) of 7.1 events per hour.  There were 4 hypopneas (>=4% desaturation) resulting in an Apnea\Hypopnea Index (AHI >=4% desaturation) of 2.4 events per hour.  There were 37 Respiratory Effort Related Arousals resulting in a RERA index of 21.8 events per hour. The Respiratory Disturbance Index is 28.8 events per hour.  The snore index was - events per hour. Mean oxygen  saturation was 93.9%.  The lowest oxygen  saturation during sleep was 87.0%.  Time spent <=88% oxygen  saturation was 0.3 minutes (0.1%). Limb Activity There were 4 limb movements recorded.  Of this total, 4 were classified as PLMs.  Of the PLMs, - were associated with arousals.  The Limb Movement index was 2.4 per hour while the PLM index was 2.4 per hour. Cardiac Summary The average pulse rate was 75.7 bpm.  The minimum pulse rate was 58.0 bpm while the maximum pulse rate was 96.0 bpm.  Cardiac rhythm was normal/abnormal. Comments: Sleep efficiency was very poor. She c/o Rt hip pain. Study was sub optimal No REM sleep noted . Minimal supine sleep at 1.7V Diagnosis: OSA s/p hypoglossal nerve stimulator implant Therapeutic amplitude was obtained at 1.7 & 1.8V Recommendations: Consider  setting device at 1.7 V with range of 1.5-1.9 & follow clinically at this level This study was personally reviewed and electronically signed by: Dr. Harden Staff Accredited Board Certified in Sleep Medicine  DG Bone Density Result Date: 09/30/2023 EXAM: DUAL X-RAY ABSORPTIOMETRY (DXA) FOR BONE MINERAL DENSITY 09/29/2023 3:35 pm CLINICAL DATA:  68 year old Female Postmenopausal. TECHNIQUE: An axial (e.g., hips, spine) and/or appendicular (e.g., radius) exam was performed, as appropriate, using GE Secretary/administrator at [employer]. Images are obtained for bone mineral density measurement and are not obtained for diagnostic  purposes. [id number] Exclusions: L3-L4 due to degenerative changes. COMPARISON:  None. New baseline. FINDINGS: Scan quality: Good. LUMBAR SPINE (L1-L2): BMD (in g/cm2): 1.074 T-score: -0.8 Z-score: 0.8 LEFT FEMORAL NECK: BMD (in g/cm2): 0.709 T-score: -2.4 Z-score: -0.8 LEFT TOTAL HIP: BMD (in g/cm2): 0.737 T-score: -2.1 Z-score: -0.8 RIGHT FEMORAL NECK: BMD (in g/cm2): 0.798 T-score: -1.7 Z-score: -0.2 RIGHT TOTAL HIP: BMD (in g/cm2): 0.785 T-score: -1.8 Z-score: -0.5 RIGHT FOREARM (RADIUS 33%): BMD (in g/cm2): 0.748 T-score: -1.5 Z-score: 0.1 FRAX 10-YEAR PROBABILITY OF FRACTURE: 10-year fracture risk is performed using the [facility] FRAX calculator based on patient-reported risk factors. Major osteoporotic fracture: 20.7% Hip fracture: 4.3% Other situations known to alter the reliability of the FRAX score should be considered when making treatment decisions, including chronic glucocorticoid use and past treatments. Further guidance on treatment can be found at the [facility]'s website https://www.patton.com/. IMPRESSION: Osteopenia based on BMD. Fracture risk is increased. RECOMMENDATIONS: 1. All patients should optimize calcium  and vitamin D  intake. 2. Consider FDA-approved medical therapies in postmenopausal women and men aged 81 years and older, based on the following: - A hip or vertebral (clinical or morphometric) fracture - T-score less than or equal to -2.5 and secondary causes have been excluded. - Low bone mass (T-score between -1.0 and -2.5) and a 10-year probability of a hip fracture greater than or equal to 3% or a 10-year probability of a major osteoporosis-related fracture greater than or equal to 20% based on the [place] -adapted WHO algorithm. - Clinician judgment and/or patient preferences may indicate treatment for people with 10-year fracture probabilities above or below these levels 3. Patients with diagnosis of osteoporosis or at high risk for fracture should have regular  bone mineral density tests. For patients eligible for Medicare, routine testing is allowed once every 2 years. The testing frequency can be increased to one year for patients who have rapidly progressing disease, those who are receiving or discontinuing medical therapy to restore bone mass, or have additional risk factors. Electronically Signed   By: Reyes Phi M.D.   On: 09/30/2023 17:18    cyanocobalamin  (VITAMIN B12) injection 1,000 mcg     Date Action Dose Route User   09/07/2023 1012 Given 1,000 mcg Intramuscular (Right Deltoid) Rainey, Kaiya S, CMA      cyanocobalamin  (VITAMIN B12) injection 1,000 mcg     Date Action Dose Route User   10/06/2023 [phone] Given 1,000 mcg Intramuscular (Right Deltoid) Cleatus Suzen SAILOR, RN           No data to display          No results found for: NITRICOXIDE      Assessment & Plan:   No problem-specific Assessment & Plan notes found for this encounter.  Assessment and Plan Assessment & Plan Obstructive sleep apnea, status post Inspire device implantation   Obstructive sleep apnea is well-controlled with the Inspire device. A recent  titration study confirmed a therapeutic voltage level at 1.7 volts, range at 1.5 to 1.9 V.  Effectively managing sleep apnea and maintaining oxygen  levels. Device site healing is ongoing; the device pocket had issues with adherence Set the Inspire device to 1.7 volts, allowing a range of two levels up or down. Ensure she understands the new five-level range on the remote.. Encourage follow-up with Dr. Carlie for device site evaluation as planned   Plan  Patient Instructions  Continue to use INSPIRE each night.  We adjusted your INSPIRE level to Level 3 (1.7v)   Your range is Level 1-5 (1.5-1.9v)  Follow up with Dr. Carlie as planned  Follow up in 6 months and As needed       I spent 35   minutes dedicated to the care of this patient on the date of this encounter to include pre-visit review of records,  face-to-face time with the patient discussing conditions above, post visit ordering of testing, clinical documentation with the electronic health record, making appropriate referrals as documented, and communicating necessary findings to members of the patients care team.    Madelin Stank, NP 10/28/2023

## 2023-11-03 ENCOUNTER — Ambulatory Visit (INDEPENDENT_AMBULATORY_CARE_PROVIDER_SITE_OTHER): Admitting: Internal Medicine

## 2023-11-03 ENCOUNTER — Other Ambulatory Visit: Payer: Self-pay | Admitting: Urology

## 2023-11-03 DIAGNOSIS — E538 Deficiency of other specified B group vitamins: Secondary | ICD-10-CM

## 2023-11-03 MED ORDER — CYANOCOBALAMIN 1000 MCG/ML IJ SOLN
1000.0000 ug | Freq: Once | INTRAMUSCULAR | Status: AC
  Administered 2023-11-03: 1000 ug via INTRAMUSCULAR

## 2023-11-03 NOTE — Progress Notes (Signed)
 Pt here for monthly B12 injection per PCP  Last B12 injection: 10/06/2023  B12 1000mcg given IM L deltoid, and pt tolerated injection well.  Next B12 injection scheduled for: 11/30/2023

## 2023-11-07 ENCOUNTER — Other Ambulatory Visit: Payer: Self-pay | Admitting: Allergy & Immunology

## 2023-11-11 ENCOUNTER — Other Ambulatory Visit: Payer: Self-pay | Admitting: Internal Medicine

## 2023-11-11 DIAGNOSIS — G2409 Other drug induced dystonia: Secondary | ICD-10-CM

## 2023-11-15 ENCOUNTER — Other Ambulatory Visit: Payer: Self-pay | Admitting: Internal Medicine

## 2023-11-15 DIAGNOSIS — E039 Hypothyroidism, unspecified: Secondary | ICD-10-CM

## 2023-11-15 DIAGNOSIS — E785 Hyperlipidemia, unspecified: Secondary | ICD-10-CM

## 2023-11-16 ENCOUNTER — Other Ambulatory Visit: Payer: Self-pay | Admitting: Internal Medicine

## 2023-11-16 DIAGNOSIS — E1122 Type 2 diabetes mellitus with diabetic chronic kidney disease: Secondary | ICD-10-CM

## 2023-11-16 DIAGNOSIS — I5189 Other ill-defined heart diseases: Secondary | ICD-10-CM

## 2023-11-16 NOTE — Telephone Encounter (Signed)
 Error

## 2023-11-17 ENCOUNTER — Encounter: Payer: Self-pay | Admitting: Allergy & Immunology

## 2023-11-17 ENCOUNTER — Ambulatory Visit: Admitting: Allergy & Immunology

## 2023-11-17 ENCOUNTER — Other Ambulatory Visit: Payer: Self-pay

## 2023-11-17 VITALS — BP 110/72 | HR 80 | Temp 98.4°F | Wt 134.7 lb

## 2023-11-17 DIAGNOSIS — J302 Other seasonal allergic rhinitis: Secondary | ICD-10-CM

## 2023-11-17 DIAGNOSIS — J4540 Moderate persistent asthma, uncomplicated: Secondary | ICD-10-CM | POA: Diagnosis not present

## 2023-11-17 DIAGNOSIS — J3089 Other allergic rhinitis: Secondary | ICD-10-CM | POA: Diagnosis not present

## 2023-11-17 DIAGNOSIS — J0100 Acute maxillary sinusitis, unspecified: Secondary | ICD-10-CM

## 2023-11-17 DIAGNOSIS — J383 Other diseases of vocal cords: Secondary | ICD-10-CM

## 2023-11-17 MED ORDER — DOXYCYCLINE MONOHYDRATE 100 MG PO CAPS: 100.0000 mg | ORAL_CAPSULE | Freq: Two times a day (BID) | ORAL | 0 refills | Status: AC

## 2023-11-17 MED ORDER — MONTELUKAST SODIUM 10 MG PO TABS: 10.0000 mg | ORAL_TABLET | Freq: Every day | ORAL | 3 refills | Status: AC

## 2023-11-17 NOTE — Patient Instructions (Addendum)
 1. Moderate persistent asthma, uncomplicated - Spirometry looked excellent today. - Agree with holding off on Breztri  since you are doing fine without it.  - Daily controller medication(s): Singulair  10mg  daily - Prior to physical activity: albuterol  2 puffs 10-15 minutes before physical activity. - Rescue medications: albuterol  4 puffs every 4-6 hours as needed - Changes during respiratory infections or worsening symptoms: Add on Breztri  two puffs for TWO WEEKS. - Asthma control goals:  * Full participation in all desired activities (may need albuterol  before activity) * Albuterol  use two time or less a week on average (not counting use with activity) * Cough interfering with sleep two time or less a month * Oral steroids no more than once a year * No hospitalizations  2. Seasonal and perennial allergic rhinitis - with overlying sinusitis - Continue with Singulair  (montelukast ) 10mg  once daily. - Continue with carbinoxamine  up to twice daily.  - Continue with azelastine  one spray per nostril up to TWICE as needed depending on your symptoms.  - Let's start you on a round of antibiotics (doxycycline  100mg  twice daily for ten days). - This can cause photosensitivity, so beware of that and use sunscreen.  - Can consider allergy shots for long-term control.   3. Return in about 6 months (around 05/17/2024). You can have the follow up appointment with Dr. Iva or a Nurse Practicioner (our Nurse Practitioners are excellent and always have Physician oversight!).    Please inform us  of any Emergency Department visits, hospitalizations, or changes in symptoms. Call us  before going to the ED for breathing or allergy symptoms since we might be able to fit you in for a sick visit. Feel free to contact us  anytime with any questions, problems, or concerns.  It was a pleasure to see you again today!  Websites that have reliable patient information: 1. American Academy of Asthma, Allergy, and  Immunology: www.aaaai.org 2. Food Allergy Research and Education (FARE): foodallergy.org 3. Mothers of Asthmatics: http://www.asthmacommunitynetwork.org 4. American College of Allergy, Asthma, and Immunology: www.acaai.org      "Like" us  on Facebook and Instagram for our latest updates!      A healthy democracy works best when [employer] participate! Make sure you are registered to vote! If you have moved or changed any of your contact information, you will need to get this updated before voting! Scan the QR codes below to learn more!                 -

## 2023-11-17 NOTE — Progress Notes (Unsigned)
 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   Subjective:   MINERVIA OSSO is a 68 y.o. 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 03/25/2023   Tachycardia 03/24/2023   Type 2 diabetes mellitus with stage 3b chronic kidney disease, without long-term current use of insulin (HCC) 10/26/2022   Type 2 diabetes mellitus with stage 3a chronic kidney disease, without long-term current use of insulin (HCC) 07/09/2022   Hyperlipidemia LDL goal <100 08/24/2021   Encounter for general adult medical examination with abnormal findings 03/14/2021   Estrogen deficiency 03/09/2021   Cervical cancer screening 03/09/2021   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   Grade I diastolic dysfunction 03/12/2020   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   Osteoarthritis of glenohumeral joint 03/26/2019   Iron deficiency anemia 04/28/2018   OA (osteoarthritis) of knee 12/26/2017   Vitamin D  deficiency, unspecified 10/13/2016   Primary osteoarthritis of left hand 06/30/2016   Osteoporosis without current pathological fracture 03/22/2016   Paraesophageal hiatal hernia s/p robotic repair & fundoplication 03/04/2016 03/04/2016   Bipolar 1 disorder, mixed, moderate (HCC) 01/29/2016   Class 1 obesity with body mass index (BMI) of 32.0 to 32.9 in adult 01/06/2016   Migraines 11/21/2014   Sleep apnea 01/30/2007   GERD 11/02/2006   Hypothyroidism  10/07/2006   Bipolar affective (HCC) 10/07/2006   Essential hypertension 10/07/2006   RHINITIS, CHRONIC 10/07/2006   Seasonal and perennial allergic rhinitis 10/07/2006   Moderate persistent asthma, uncomplicated 10/07/2006   Irritable bowel syndrome 10/07/2006   INTERSTITIAL CYSTITIS 10/07/2006   ROSACEA 10/07/2006   Osteoarthritis of both knees 10/07/2006    History obtained from: chart review and {Persons; PED relatives w/patient:19415::patient}.  Discussed the use of AI scribe software for clinical note transcription with the patient and/or guardian, who gave verbal consent to proceed.  Shawnise is a 68 y.o. female presenting for {Blank single:19197::a food challenge,a drug challenge,skin testing,a sick visit,an evaluation of ***,a follow up visit}.  She was last seen in April 2025.  At that time, her spirometry looked great.  We continue with Singulair  10 mg daily and Breztri  160 mcg 2 puffs twice daily.  For her rhinitis, we continue with Singulair  as well as carbinoxamine  and Astelin .  We gave her a Depo-Medrol  injection and a low-dose prednisone  taper.  Since last visit,  Asthma/Respiratory Symptom History: ***  Allergic Rhinitis Symptom History: ***  Food Allergy Symptom History: ***  Skin Symptom History: ***  GERD Symptom History: ***  Infection Symptom History: ***  Otherwise, there have been no changes to her past medical history, surgical history, family history, or social history.    Review of systems otherwise negative other than that mentioned in the HPI.    Objective:   Weight 134 lb 11.2 oz (61.1 kg), last menstrual period 03/05/2012. Body mass index is 27.21 kg/m.    Physical Exam   Diagnostic studies:    Spirometry: results  normal (FEV1: 2.10/113%, FVC: 2.80/119%, FEV1/FVC: 75%).    Spirometry consistent with normal pattern. {Blank single:19197::Albuterol /Atrovent  nebulizer,Xopenex/Atrovent  nebulizer,Albuterol   nebulizer,Albuterol  four puffs via MDI,Xopenex four puffs via MDI} treatment given in clinic with {Blank single:19197::significant improvement in FEV1 per ATS criteria,significant improvement in FVC per ATS criteria,significant improvement in FEV1 and FVC per ATS criteria,improvement in FEV1, but not significant per ATS criteria,improvement in FVC, but not significant per ATS criteria,improvement in FEV1 and FVC, but not significant per ATS criteria,no improvement}.  Allergy Studies: {Blank single:19197::none,deferred due to recent antihistamine use,deferred due to insurance stipulations that require a separate visit for testing,labs sent instead, }    {Blank single:19197::Allergy testing results were read and interpreted by myself, documented by clinical staff., }      Marty Shaggy, MD  [facility] 

## 2023-11-23 ENCOUNTER — Telehealth: Payer: Self-pay

## 2023-11-23 NOTE — Telephone Encounter (Signed)
 Due 01/07/2024 awaiting updated PA, may have to switch to Jub[employer]onti.

## 2023-11-23 NOTE — Telephone Encounter (Signed)
 Due 01/07/2024 awaiting updated PA, may have to switch to Jubbonti.     Patient has a Prolia , Pharm supplied that arrive onsite in Aug. 2025

## 2023-11-30 ENCOUNTER — Ambulatory Visit

## 2023-11-30 DIAGNOSIS — F3132 Bipolar disorder, current episode depressed, moderate: Secondary | ICD-10-CM | POA: Diagnosis not present

## 2023-12-02 ENCOUNTER — Ambulatory Visit (INDEPENDENT_AMBULATORY_CARE_PROVIDER_SITE_OTHER)

## 2023-12-02 DIAGNOSIS — E538 Deficiency of other specified B group vitamins: Secondary | ICD-10-CM

## 2023-12-02 MED ORDER — CYANOCOBALAMIN 1000 MCG/ML IJ SOLN
1000.0000 ug | Freq: Once | INTRAMUSCULAR | Status: AC
  Administered 2023-12-02: 1000 ug via INTRAMUSCULAR

## 2023-12-02 NOTE — Progress Notes (Signed)
 Pt received B12 injection w/o complications

## 2023-12-06 ENCOUNTER — Telehealth: Payer: Self-pay

## 2023-12-06 ENCOUNTER — Other Ambulatory Visit: Payer: Self-pay | Admitting: Internal Medicine

## 2023-12-06 ENCOUNTER — Other Ambulatory Visit (HOSPITAL_COMMUNITY): Payer: Self-pay

## 2023-12-06 DIAGNOSIS — G2409 Other drug induced dystonia: Secondary | ICD-10-CM

## 2023-12-06 NOTE — Telephone Encounter (Signed)
 MEDICAL PA SUBMITTED VIA [employer]. Authorization Number : [id number]   PHARMACY: LAST FILLED 09/30/23. NEXT FILL 03/28/24.

## 2023-12-06 NOTE — Telephone Encounter (Signed)
 SABRA

## 2023-12-06 NOTE — Telephone Encounter (Signed)
 Prolia  VOB initiated via [email or web address]  Next Prolia  inj DUE: 01/06/24

## 2023-12-07 NOTE — Telephone Encounter (Signed)
 Pt ready for scheduling for PROLIA  on or after : 01/06/24  Option# 1: Buy/Bill (Office supplied medication)  Out-of-pocket cost due at time of clinic visit: $352  Number of injection/visits approved: 2  Primary: [employer] Prolia  co-insurance: 20% Admin fee co-insurance: $20  Secondary: --- Prolia  co-insurance:  Admin fee co-insurance:   Medical Benefit Details: Date Benefits were checked: 12/06/23 Deductible: NO/ Coinsurance: 20%/ Admin Fee: $20  Prior Auth: APPROVED PA# [id number] Expiration Date: 12/06/23-12/05/24   # of doses approved: 2 ----------------------------------------------------------------------- Option# 2- Med Obtained from pharmacy:  Pharmacy benefit: Copay $--- KELLIE 09/30/23 (Paid to pharmacy) Admin Fee: $20 (Pay at clinic)  Prior Auth: N/A PA# Expiration Date:   # of doses approved:   If patient wants fill through the pharmacy benefit please send prescription to: [facility], and include estimated need by date in rx notes. Pharmacy will ship medication directly to the office.  Patient NOT eligible for Prolia  Copay Card. Copay Card can make patient's cost as little as $25. Link to apply: [email or web address]  ** This summary of benefits is an estimation of the patient's out-of-pocket cost. Exact cost may very based on individual plan coverage.

## 2023-12-09 NOTE — Patient Instructions (Addendum)
 SURGICAL WAITING ROOM VISITATION Patients having surgery or a procedure may have no more than 2 support people in the waiting area - these visitors may rotate.    Children under the age of 38 must have an adult with them who is not the patient.  If the patient needs to stay at the hospital during part of their recovery, the visitor guidelines for inpatient rooms apply. Pre-op nurse will coordinate an appropriate time for 1 support person to accompany patient in pre-op.  This support person may not rotate.    Please refer to the [facility] website for the visitor guidelines for Inpatients (after your surgery is over and you are in a regular room).       Your procedure is scheduled on: 12-20-23   Report to [facility] Main Entrance    Report to admitting at 7:15 AM   Call this number if you have problems the morning of surgery [phone]   Do not eat food or drink liquids :After Midnight.           If you have questions, please contact your surgeon's office.   FOLLOW  ANY ADDITIONAL PRE OP INSTRUCTIONS YOU RECEIVED FROM YOUR SURGEON'S OFFICE!!!     Oral Hygiene is also important to reduce your risk of infection.                                    Remember - BRUSH YOUR TEETH THE MORNING OF SURGERY WITH YOUR REGULAR TOOTHPASTE   Do NOT smoke after Midnight   Take these medicines the morning of surgery with A SIP OF WATER :    Austedo    Carbinoxamine    Vraylar   Clonazepam    Duloxetine   Levothyroxine    Metoprolol    Nurtec   Rosuvastatin    Okay to use inhalers, nasal spray  Stop all vitamins and herbal supplements 7 days before surgery   WHAT DO I DO ABOUT MY DIABETES MEDICATION?  Do not take oral diabetes medicines (pills) the morning of surgery.  Hold Jardiance  3 days before surgery (do not take after 12-16-23)  DO NOT TAKE THE FOLLOWING 7 DAYS PRIOR TO SURGERY: Ozempic, Wegovy, Rybelsus (Semaglutide), Byetta (exenatide), Bydureon (exenatide ER), Victoza,  Saxenda (liraglutide), or Trulicity (dulaglutide) Mounjaro (Tirzepatide) Adlyxin (Lixisenatide), Polyethylene Glycol Loxenatide.  Reviewed and Endorsed by [facility] Patient Education Committee, August 2015                              You may not have any metal on your body including hair pins, jewelry, and body piercing             Do not wear make-up, lotions, powders, perfumes or deodorant  Do not wear nail polish including gel and S&S, artificial/acrylic nails, or any other type of covering on natural nails including finger and toenails. If you have artificial nails, gel coating, etc. that needs to be removed by a nail salon please have this removed prior to surgery or surgery may need to be canceled/ delayed if the surgeon/ anesthesia feels like they are unable to be safely monitored.   Do not shave  48 hours prior to surgery.    Do not bring valuables to the hospital. [facility] IS NOT RESPONSIBLE   FOR VALUABLES.   Contacts, dentures or bridgework may not be worn into surgery.   DO  NOT BRING YOUR HOME MEDICATIONS TO THE HOSPITAL. PHARMACY WILL DISPENSE MEDICATIONS LISTED ON YOUR MEDICATION LIST TO YOU DURING YOUR ADMISSION IN THE HOSPITAL!    Patients discharged on the day of surgery will not be allowed to drive home.  Someone NEEDS to stay with you for the first 24 hours after anesthesia.               Please read over the following fact sheets you were given: IF YOU HAVE QUESTIONS ABOUT YOUR PRE-OP INSTRUCTIONS PLEASE CALL [phone] Gwen  If you received a COVID test during your pre-op visit  it is requested that you wear a mask when out in public, stay away from anyone that may not be feeling well and notify your surgeon if you develop symptoms. If you test positive for Covid or have been in contact with anyone that has tested positive in the last 10 days please notify you surgeon.  [place] - Preparing for Surgery Before surgery, you can play an important role.  Because  skin is not sterile, your skin needs to be as free of germs as possible.  You can reduce the number of germs on your skin by washing with CHG (chlorahexidine gluconate) soap before surgery.  CHG is an antiseptic cleaner which kills germs and bonds with the skin to continue killing germs even after washing. Please DO NOT use if you have an allergy to CHG or antibacterial soaps.  If your skin becomes reddened/irritated stop using the CHG and inform your nurse when you arrive at Short Stay. Do not shave (including legs and underarms) for at least 48 hours prior to the first CHG shower.  You may shave your face/neck.  Please follow these instructions carefully:  1.  Shower with CHG Soap the night before surgery and the  morning of surgery.  2.  If you choose to wash your hair, wash your hair first as usual with your normal  shampoo.  3.  After you shampoo, rinse your hair and body thoroughly to remove the shampoo.                             4.  Use CHG as you would any other liquid soap.  You can apply chg directly to the skin and wash.  Gently with a scrungie or clean washcloth.  5.  Apply the CHG Soap to your body ONLY FROM THE NECK DOWN.   Do   not use on face/ open                           Wound or open sores. Avoid contact with eyes, ears mouth and   genitals (private parts).                       Wash face,  Genitals (private parts) with your normal soap.             6.  Wash thoroughly, paying special attention to the area where your    surgery  will be performed.  7.  Thoroughly rinse your body with warm water  from the neck down.  8.  DO NOT shower/wash with your normal soap after using and rinsing off the CHG Soap.                9.  Pat yourself dry with a clean towel.  10.  Wear clean pajamas.            11.  Place clean sheets on your bed the night of your first shower and do not  sleep with pets. Day of Surgery : Do not apply any lotions/deodorants the morning of surgery.   Please wear clean clothes to the hospital/surgery center.  FAILURE TO FOLLOW THESE INSTRUCTIONS MAY RESULT IN THE CANCELLATION OF YOUR SURGERY  PATIENT SIGNATURE_________________________________  NURSE SIGNATURE__________________________________  ________________________________________________________________________

## 2023-12-09 NOTE — Progress Notes (Addendum)
  Date of COVID positive in last 90 days:  No  PCP - Debby Molt, MD Cardiologist - Peter Jordan, MD (last OV 2019) Pulmonolgist - Jennet Epley, MD  Chest x-ray - !v 03-09-23 Epic EKG - 06-21-23 Epic Stress Test - N/A ECHO - 12-06-17 Epic Cardiac Cath - N/A Pacemaker/ICD device last checked:N/A Spinal Cord Stimulator:N/A  Bowel Prep - N/A  Sleep Study - Yes, +sleep apnea Has Inspire (pt will bring remote)  Fasting Blood Sugar - does not check Checks Blood Sugar _____ times a day  Last dose of GLP1 agonist-  N/A GLP1 instructions:  Do not take after     Jardiance  Last dose of SGLT-2 inhibitors-  N/A SGLT-2 instructions:  Do not take after  12-16-23  Blood Thinner Instructions: N/A Last dose:   Time: Aspirin Instructions:N/A Last Dose:  Activity level:  Can go up a flight of stairs and perform activities of daily living without stopping and without symptoms of chest pain or shortness of breath.  Patient has some limitations due to severe arthritis.  Anesthesia review: PVCs and tachycardia, OSA, DM  Patient denies shortness of breath, fever, cough and chest pain at PAT appointment  Patient verbalized understanding of instructions that were given to them at the PAT appointment. Patient was also instructed that they will need to review over the PAT instructions again at home before surgery.

## 2023-12-10 ENCOUNTER — Other Ambulatory Visit: Payer: Self-pay | Admitting: Internal Medicine

## 2023-12-10 DIAGNOSIS — E785 Hyperlipidemia, unspecified: Secondary | ICD-10-CM

## 2023-12-12 ENCOUNTER — Telehealth: Payer: Self-pay

## 2023-12-12 ENCOUNTER — Encounter: Payer: Self-pay | Admitting: Radiology

## 2023-12-12 NOTE — Telephone Encounter (Signed)
 Copied from CRM #[id number]. Topic: Medical Record Request - Provider/Facility Request >> Dec 12, 2023  1:36 PM Zy'onna H wrote: Reason for CRM: Patient calling to notify she will be have a Procedure completed:  Bladder Hydrodistention - 12/20/2023 with Watt Rush, MD - Urology

## 2023-12-13 ENCOUNTER — Ambulatory Visit (INDEPENDENT_AMBULATORY_CARE_PROVIDER_SITE_OTHER): Admitting: Emergency Medicine

## 2023-12-13 ENCOUNTER — Encounter (HOSPITAL_COMMUNITY): Payer: Self-pay

## 2023-12-13 ENCOUNTER — Ambulatory Visit: Payer: Self-pay

## 2023-12-13 ENCOUNTER — Encounter: Payer: Self-pay | Admitting: Emergency Medicine

## 2023-12-13 ENCOUNTER — Other Ambulatory Visit: Payer: Self-pay

## 2023-12-13 ENCOUNTER — Encounter (HOSPITAL_COMMUNITY)
Admission: RE | Admit: 2023-12-13 | Discharge: 2023-12-13 | Disposition: A | Discharge status: Home / Self Care | Source: Ambulatory Visit | Attending: Urology | Admitting: Urology

## 2023-12-13 VITALS — BP 120/89 | HR 97 | Temp 98.2°F | Resp 14 | Ht 59.0 in | Wt 128.4 lb

## 2023-12-13 VITALS — BP 122/80 | HR 99 | Temp 98.3°F | Ht 59.0 in | Wt 131.0 lb

## 2023-12-13 DIAGNOSIS — E119 Type 2 diabetes mellitus without complications: Secondary | ICD-10-CM | POA: Insufficient documentation

## 2023-12-13 DIAGNOSIS — S6722XA Crushing injury of left hand, initial encounter: Secondary | ICD-10-CM

## 2023-12-13 DIAGNOSIS — N3010 Interstitial cystitis (chronic) without hematuria: Secondary | ICD-10-CM | POA: Diagnosis not present

## 2023-12-13 DIAGNOSIS — D649 Anemia, unspecified: Secondary | ICD-10-CM | POA: Insufficient documentation

## 2023-12-13 DIAGNOSIS — W5501XA Bitten by cat, initial encounter: Secondary | ICD-10-CM

## 2023-12-13 DIAGNOSIS — Z01812 Encounter for preprocedural laboratory examination: Secondary | ICD-10-CM | POA: Diagnosis not present

## 2023-12-13 DIAGNOSIS — Z01818 Encounter for other preprocedural examination: Secondary | ICD-10-CM | POA: Diagnosis not present

## 2023-12-13 HISTORY — DX: Squamous cell carcinoma of skin of unspecified ear and external auricular canal: C44.221

## 2023-12-13 HISTORY — DX: Anemia, unspecified: D64.9

## 2023-12-13 LAB — CBC
HCT: 43.6 % (ref 36.0–46.0)
Hemoglobin: 13.9 g/dL (ref 12.0–15.0)
MCH: 29.1 pg (ref 26.0–34.0)
MCHC: 31.9 g/dL (ref 30.0–36.0)
MCV: 91.2 fL (ref 80.0–100.0)
Platelets: 191 K/uL (ref 150–400)
RBC: 4.78 MIL/uL (ref 3.87–5.11)
RDW: 12.8 % (ref 11.5–15.5)
WBC: 9.6 K/uL (ref 4.0–10.5)
nRBC: 0 % (ref 0.0–0.2)

## 2023-12-13 LAB — BASIC METABOLIC PANEL WITH GFR
Anion gap: 14 (ref 5–15)
BUN: 18 mg/dL (ref 8–23)
CO2: 24 mmol/L (ref 22–32)
Calcium: 10 mg/dL (ref 8.9–10.3)
Chloride: 104 mmol/L (ref 98–111)
Creatinine, Ser: 0.93 mg/dL (ref 0.44–1.00)
GFR, Estimated: >60 mL/min (ref >60)
Glucose, Bld: 111 mg/dL — ABNORMAL HIGH (ref 70–99)
Potassium: 4.1 mmol/L (ref 3.5–5.1)
Sodium: 141 mmol/L (ref 135–145)

## 2023-12-13 LAB — HEMOGLOBIN A1C
Hgb A1c MFr Bld: 5.7 % — ABNORMAL HIGH (ref 4.8–5.6)
Mean Plasma Glucose: 116.89 mg/dL

## 2023-12-13 LAB — GLUCOSE, CAPILLARY: Glucose-Capillary: 117 mg/dL — ABNORMAL HIGH (ref 70–99)

## 2023-12-13 MED ORDER — AMOXICILLIN-POT CLAVULANATE 875-125 MG PO TABS: 1.0000 | ORAL_TABLET | Freq: Two times a day (BID) | ORAL | 0 refills | Status: AC

## 2023-12-13 NOTE — Assessment & Plan Note (Addendum)
 Infected wound.  Known cat. Topical wound care discussed Recommend to start Augmentin  875 mg twice a day for 7 days Advised to contact the office if no better or worse during the next several days Up to date with tetanus Pain management discussed

## 2023-12-13 NOTE — Progress Notes (Signed)
 [name] 68 y.o.   Chief Complaint  Patient presents with   Animal Bite    More red and has enlarged, happen yesterday evening on the right hand    HISTORY OF PRESENT ILLNESS: Acute problem visit today. This is a 68 y.o. female complaining of cat bite to left hand which happened yesterday Complaining of redness, swelling, and tenderness.  Animal Bite  Pertinent negatives include no abdominal pain, no nausea, no vomiting, no headaches and no cough.     Prior to Admission medications   Medication Sig Start Date End Date Taking? Authorizing Provider  albuterol  (VENTOLIN  HFA) 108 (90 Base) MCG/ACT inhaler INHALE 1-2 PUFFS INTO THE LUNGS DAILY AS NEEDED FOR WHEEZING OR SHORTNESS OF BREATH. 11/07/23  Yes Iva Marty Saltness, MD  amoxicillin -clavulanate (AUGMENTIN ) 875-125 MG tablet Take 1 tablet by mouth 2 (two) times daily for 7 days. 12/13/23 12/20/23 Yes Brittan Mapel, Emil Schanz, MD  ascorbic acid (VITAMIN C) 500 MG tablet Take 500 mg by mouth daily.   Yes [provider]  AUSTEDO  XR 6 MG TB24 TAKE 1 TABLET BY MOUTH 1 TIME A DAY 12/09/23  Yes Joshua Debby CROME, MD  Azelastine  HCl 137 MCG/SPRAY SOLN Place 2 sprays into the nose 2 (two) times daily. Patient taking differently: Place 2 sprays into the nose daily. 05/19/23  Yes Iva Marty Saltness, MD  budesonide -formoterol  (SYMBICORT ) 160-4.5 MCG/ACT inhaler Inhale 2 puffs into the lungs daily.   Yes [provider]  Carbinoxamine  Maleate 4 MG TABS Take 1 tablet (4 mg total) by mouth every 6 (six) hours. 05/19/23  Yes Iva Marty Saltness, MD  Cholecalciferol  (VITAMIN D ) 50 MCG (2000 UT) CAPS Take 2,000 Units by mouth daily.   Yes [provider]  clonazePAM  (KLONOPIN ) 1 MG tablet Take 1 mg by mouth daily as needed for anxiety. 11/10/22  Yes [provider]  cyanocobalamin  (VITAMIN B12) 1000 MCG/ML injection Inject 1,000 mcg into the muscle every 30 (thirty) days.   Yes [provider]   denosumab  (PROLIA ) 60 MG/ML SOSY injection INJECT 1 SYRINGE UNDER THE SKIN ONCE EVERY 6 MONTHS 01/07/24  Yes Joshua Debby CROME, MD  dicyclomine  (BENTYL ) 10 MG capsule TAKE 1 CAPSULE (10 MG TOTAL) BY MOUTH 3 (THREE) TIMES DAILY BEFORE MEALS. 03/24/23  Yes Joshua Debby CROME, MD  DULoxetine (CYMBALTA) 60 MG capsule Take 60 mg by mouth 2 (two) times daily at 10 AM and 5 PM. 04/29/21  Yes [provider]  fluticasone  (FLONASE ) 50 MCG/ACT nasal spray PLACE 1 SPRAY INTO BOTH NOSTRILS 2 (TWO) TIMES DAILY AS NEEDED FOR ALLERGIES OR RHINITIS. 08/17/23  Yes Iva Marty Saltness, MD  JARDIANCE  10 MG TABS tablet TAKE 1 TABLET BY MOUTH EVERY DAY BEFORE BREAKFAST 11/16/23  Yes Joshua Debby CROME, MD  l-methylfolate-B6-B12 (METANX) 3-35-2 MG TABS tablet Take 2 tablets by mouth at bedtime.   Yes [provider]  levothyroxine  (SYNTHROID ) 25 MCG tablet TAKE 1 TABLET BY MOUTH EVERY DAY 11/15/23  Yes Joshua Debby CROME, MD  metoprolol  succinate (TOPROL -XL) 25 MG 24 hr tablet TAKE 1 TABLET (25 MG TOTAL) BY MOUTH DAILY. 09/19/23  Yes Joshua Debby CROME, MD  montelukast  (SINGULAIR ) 10 MG tablet Take 1 tablet (10 mg total) by mouth at bedtime. 11/17/23  Yes Iva Marty Saltness, MD  NURTEC 75 MG TBDP TAKE 1 TABLET BY MOUTH EVERY OTHER DAY 02/10/23  Yes Joshua Debby CROME, MD  promethazine  (PHENERGAN ) 12.5 MG tablet Take 1 tablet (12.5 mg total) by mouth every 6 (six)  hours as needed for nausea or vomiting. 06/22/23  Yes Joshua Debby CROME, MD  QUEtiapine (SEROQUEL) 25 MG tablet Take 25-50 mg by mouth at bedtime as needed (sleep).   Yes [provider]  rosuvastatin  (CRESTOR ) 20 MG tablet TAKE 1 TABLET BY MOUTH EVERY DAY 12/12/23  Yes Joshua Debby CROME, MD  SODIUM FLUORIDE  5000 PPM 1.1 % PSTE 1 application  at bedtime. 05/22/23  Yes [provider]  spironolactone  (ALDACTONE ) 25 MG tablet TAKE 1 TABLET (25 MG TOTAL) BY MOUTH DAILY. 10/18/23  Yes Joshua Debby CROME, MD  tiZANidine  (ZANAFLEX ) 2 MG tablet TAKE 1 TABLET (2MG   TOTAL) BY MOUTH TWICE A DAY AS NEEDED FOR MUSCLE SPASM 07/27/23  Yes Addie Cordella Hamilton, MD  Vibegron (GEMTESA) 75 MG TABS Take 75 mg by mouth at bedtime.   Yes [provider]  VRAYLAR 4.5 MG CAPS Take 4.5 mg by mouth at bedtime. 11/17/23  Yes [provider]  zinc  gluconate 50 MG tablet Take 50 mg by mouth daily.   Yes [provider]    Allergies  Allergen Reactions   Emetrol Itching   Indomethacin Other (See Comments)    Muscle spasms Causes muscle spasms in neck   Iodinated Contrast Media Itching    Flushed and Fever, itch all over   Amlodipine  Swelling    Peripheral edema   Carbamazepine  Other (See Comments)    Easy and unusual bleeding    Pseudoephedrine Other (See Comments)    I fly off the walls  CANNOT SLEEP   Risperidone And Paliperidone Other (See Comments)    Stomach upset, insomnia, drooling, tremors, jerks, sensitivity to touch.    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 03/25/2023   Tachycardia 03/24/2023   Type 2 diabetes mellitus with stage 3b chronic kidney disease, without long-term current use of insulin (HCC) 10/26/2022   Type 2 diabetes mellitus with stage 3a chronic kidney disease, without long-term current use of insulin (HCC) 07/09/2022   Hyperlipidemia LDL goal <100 08/24/2021   Encounter for general adult medical examination with abnormal findings 03/14/2021   Estrogen deficiency  03/09/2021   Cervical cancer screening 03/09/2021   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   Grade I diastolic dysfunction 03/12/2020   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   Osteoarthritis of glenohumeral joint 03/26/2019   Iron deficiency anemia 04/28/2018   OA (osteoarthritis) of knee 12/26/2017   Vitamin D  deficiency, unspecified 10/13/2016   Primary osteoarthritis of left hand 06/30/2016   Osteoporosis without current pathological fracture 03/22/2016   Paraesophageal hiatal hernia s/p robotic repair & fundoplication 03/04/2016 03/04/2016   Bipolar 1 disorder, mixed, moderate (HCC) 01/29/2016   Class 1 obesity with body mass index (BMI) of 32.0 to 32.9 in adult 01/06/2016   Migraines 11/21/2014   Sleep apnea 01/30/2007   GERD 11/02/2006   Hypothyroidism 10/07/2006  Bipolar affective (HCC) 10/07/2006   Essential hypertension 10/07/2006   RHINITIS, CHRONIC 10/07/2006   Seasonal and perennial allergic rhinitis 10/07/2006   Moderate persistent asthma, uncomplicated 10/07/2006   Irritable bowel syndrome 10/07/2006   INTERSTITIAL CYSTITIS 10/07/2006   ROSACEA 10/07/2006   Osteoarthritis of both knees 10/07/2006    Past Medical History:  Diagnosis Date   Allergic rhinitis    Anemia    Anxiety    Benign essential tremor    hands   Bipolar 1 disorder, mixed, moderate (HCC)    Claustrophobia    Depression    Diabetes (HCC)    Eczema    Frequency of urination    GERD (gastroesophageal reflux disease)    History of frequent urinary tract infections    History of gastroesophageal reflux (GERD)    05-25-2017  per pt no issues since hiatal hernia repair 01/ 2018   History of hiatal hernia    History of melanoma excision    early 2000s-- BACK   History of panic attacks    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   Hypothyroidism    Intermittent palpitations    cardiology--  dr jordan   Interstitial cystitis    Limited jaw range of motion     s/p  bilateral TMJ surgery,  age 24s   Migraines    Moderate persistent asthma    pulmologist-- dr shellia   OA (osteoarthritis)    both knees   OSA on    per last sleep study 09/ 2017  mild OSA, AHI13/hr   PONV (postoperative nausea and vomiting)    PVC (premature ventricular contraction)    Rosacea    Sensation of pressure in bladder area    per pt intermittant   Squamous cell carcinoma, ear    TMJ arthralgia    Urticaria    Wears glasses    White coat syndrome without hypertension    05-25-2017  per pt hx hypertension yrs ago, no issues after quitting stressful job    Past Surgical History:  Procedure Laterality Date   24 HOUR PH STUDY N/A 09/22/2015   Procedure: 24 HOUR PH STUDY;  Surgeon: Victory LITTIE Legrand DOUGLAS, MD;  Location: [facility];  Service: Gastroenterology;  Laterality: N/A;   ADENOIDECTOMY     ANAL FISSURE REPAIR  age 62  (63)   BREAST EXCISIONAL BIOPSY Right 04-16-2003  dr p. young  [facility]   benign   BUNIONECTOMY Right early 2000s   CARPAL TUNNEL RELEASE Left age 23 (69)   CHOLECYSTECTOMY OPEN  age 36   CYSTO WITH HYDRODISTENSION N/A 06/02/2017   Procedure: CYSTOSCOPY/HYDRODISTENSION OF BLADDER;  Surgeon: Watt Rush, MD;  Location: [facility];  Service: Urology;  Laterality: N/A;   CYSTO/ HYDRODISTENTION/  INSTILLATION THERAPY  1990s   DX LAPAROSCOPY/  DX HYSTEROSCOPY/  D & C  08-07-2007   dr dove  [facility]   ENDOMETRIAL ABLATION W/ NOVASURE  10-21-2008   dr herchel  [facility]   ESOPHAGEAL MANOMETRY N/A 09/22/2015   Procedure: ESOPHAGEAL MANOMETRY (EM);  Surgeon: Victory LITTIE Legrand DOUGLAS, MD;  Location: [facility];  Service: Gastroenterology;  Laterality: N/A;  with impedence    FINGER ARTHROPLASTY Bilateral    left little finger 2016:  right middle finger 06/ 2018   IMPLANTATION OF HYPOGLOSSAL NERVE STIMULATOR Right 03/09/2023   Procedure: RIGHT IMPLANTATION OF HYPOGLOSSAL NERVE STIMULATOR;  Surgeon: Carlie Clark, MD;  Location: [facility];  Service:  ENT;  Laterality: Right;   IMPLANTATION OF HYPOGLOSSAL NERVE STIMULATOR Bilateral 08/09/2023   Procedure: REVISION CAPSULOTOMY;  Surgeon: Carlie Clark, MD;  Location: [facility];  Service: ENT;  Laterality: Bilateral;   KNEE ARTHROSCOPY Bilateral x3 left ;  x3  right-- last one age 10 (89)   NASAL SEPTUM SURGERY  age 62s   ROBOT ASSISTED REDUCTION PARAESOPHAGEAL HIATAL HERNIA/ TYPE 2 MEDIASTINAL DISSECTION/ PRIMARY HIATAL HERNIA REPAIR/ ANTERIOR & POSTERIOR GASTROPEXY/ NISSEN FUNDOPLICATION  03-04-2017   DR GROSS  [facility]   SINOSCOPY     TEMPOROMANDIBULAR JOINT SURGERY Bilateral x3  -- age 83s   per pt used graft   TONSILLECTOMY     TOTAL KNEE ARTHROPLASTY Left 12/26/2017   Procedure: LEFT TOTAL KNEE ARTHROPLASTY;  Surgeon: Melodi Lerner, MD;  Location: [facility];  Service: Orthopedics;  Laterality: Left;    TRANSTHORACIC ECHOCARDIOGRAM  12-06-2017   dr jordan   ef 55-60%, grade 1 diastoilc dysfunction, trivial MR   TRIGGER FINGER RELEASE Bilateral last one 2017   several release's bilaterally   ULNAR NERVE TRANSPOSITION Bilateral age 64 (44)    Social History   Socioeconomic History   Marital status: Divorced    Spouse name: Not on file   Number of children: 0   Years of education: Not on file   Highest education level: Bachelor's degree (e.g., BA, AB, BS)  Occupational History   Occupation: disability  Tobacco Use   Smoking status: Never    Passive exposure: Never   Smokeless tobacco: Never  Vaping Use   Vaping status: Never Used  Substance and Sexual Activity   Alcohol use: Yes    Alcohol/week: 0.0 standard drinks of alcohol    Comment: once every 3-4 months   Drug use: No   Sexual activity: Not Currently    Partners: Male    Birth control/protection: Post-menopausal    Comment: ablation  Other Topics Concern   Not on file  Social History Narrative   Lives alone with 2 cats.   Social Drivers of Health   Financial Resource Strain: Medium Risk  (04/29/2023)   Overall Financial Resource Strain (CARDIA)    Difficulty of Paying Living Expenses: Somewhat hard  Food Insecurity: No Food Insecurity (04/29/2023)   Hunger Vital Sign    Worried About Running Out of Food in the Last Year: Never true    Ran Out of Food in the Last Year: Never true  Recent Concern: Food Insecurity - Food Insecurity Present (03/22/2023)   Hunger Vital Sign    Worried About Running Out of Food in the Last Year: Sometimes true    Ran Out of Food in the Last Year: Sometimes true  Transportation Needs: No Transportation Needs (04/29/2023)   PRAPARE - Administrator, Civil Service (Medical): No    Lack of Transportation (Non-Medical): No  Physical Activity: Inactive (04/29/2023)   Exercise Vital Sign    Days of Exercise per Week: 0 days    Minutes of Exercise per Session: 0 min  Stress: Stress Concern Present (04/29/2023)   [employer] of Occupational Health - Occupational Stress Questionnaire    Feeling of Stress : Rather much  Social Connections: Moderately Integrated (04/29/2023)   Social Connection and Isolation Panel    Frequency of Communication with Friends and Family: More than three times a week    Frequency of Social Gatherings with Friends and Family: Once a week    Attends Religious Services: More than 4 times per year  Active Member of Clubs or Organizations: Yes    Attends Banker Meetings: 1 to 4 times per year    Marital Status: Divorced  Intimate Partner Violence: Patient Unable To Answer (04/29/2023)   Humiliation, Afraid, Rape, and Kick questionnaire    Fear of Current or Ex-Partner: Patient unable to answer    Emotionally Abused: Patient unable to answer    Physically Abused: Patient unable to answer    Sexually Abused: Patient unable to answer    Family History  Problem Relation Age of Onset   Hypertension Mother        deceased from MVA complications   Thyroid  disease Mother    Allergic rhinitis Mother     Testicular cancer Father    Allergic rhinitis Father    Colon cancer Sister    Colon polyps Sister    Healthy Sister    Healthy Brother    Coronary artery disease Other    Stomach cancer Neg Hx      Review of Systems  Constitutional: Negative.  Negative for chills and fever.  HENT:  Negative for congestion.   Respiratory:  Negative for cough and shortness of breath.   Gastrointestinal:  Negative for abdominal pain, nausea and vomiting.  Neurological:  Negative for dizziness and headaches.  All other systems reviewed and are negative.   Vitals:   12/13/23 1557  BP: 122/80  Pulse: 99  Temp: 98.3 F (36.8 C)  SpO2: 97%    Physical Exam Vitals reviewed.  Constitutional:      Appearance: Normal appearance.  HENT:     Head: Normocephalic.  Eyes:     Extraocular Movements: Extraocular movements intact.  Cardiovascular:     Rate and Rhythm: Normal rate.  Pulmonary:     Effort: Pulmonary effort is normal.  Skin:    General: Skin is warm and dry.     Comments: Left hand: Dorsum: Erythema and swelling surrounding puncture animal wounds Full range of motion of left wrist and fingers  Neurological:     Mental Status: She is alert and oriented to person, place, and time.  Psychiatric:        Mood and Affect: Mood normal.        Behavior: Behavior normal.      ASSESSMENT & PLAN: I personally spent a total of 30 minutes minutes in the care of the patient today including preparing to see the patient, getting/reviewing separately obtained history, performing a medically appropriate exam/evaluation, counseling and educating, documenting clinical information in the EHR, coordinating care, and need for antibiotics, prognosis and need to follow-up if no better or worse during the next several days..  Problem List Items Addressed This Visit       Other   Cat bite - Primary   Infected wound.  Known cat. Topical wound care discussed Recommend to start Augmentin  875 mg twice  a day for 7 days Advised to contact the office if no better or worse during the next several days Up to date with tetanus Pain management discussed      Relevant Medications   amoxicillin -clavulanate (AUGMENTIN ) 875-125 MG tablet   Patient Instructions  Animal Bite, Adult Animal bites can be mild or serious. Small bites from house pets normally are mild. Bites from cats, strays, or wild animals can be serious. If a stray or wild animal bites you, you need to get medical help right away. You may also need a shot to prevent rabies infection. What increases the risk?  Being near pets you do not know. Being near animals that are eating, sleeping, or caring for their babies. Being outside where small, wild animals move freely. What are the signs or symptoms? Pain. Bleeding. Swelling. Bruising. How is this treated? Treatment may include: Cleaning your wound. Rinsing out (flushing) your wound. This uses saline solution, which is made of salt and water . Putting a bandage on your wound. Closing your wound with stitches (sutures), staples, skin glue, or skin tape (adhesive strips). Antibiotic medicine. You may be given pills, cream, gel, or fluid through an IV. A tetanus shot. Rabies treatment, if the animal could have rabies. Surgery, if there is infection or damage that needs to be fixed. Follow these instructions at home: Medicines Take or apply over-the-counter and prescription medicines only as told by your doctor. If you were prescribed an antibiotic medicine, take or apply it as told by your doctor. Do not stop using it even if your wound gets better. Wound care  Follow instructions from your doctor about how to take care of your wound. Make sure you: Wash your hands with soap and water  for at least 20 seconds before and after you change your bandage. If you cannot use soap and water , use hand sanitizer. Change your bandage. Leave stitches or skin glue in place for at least 2  weeks. Leave tape strips alone unless you are told to take them off. You may trim the edges of the tape strips if they curl up. Check your wound every day for signs of infection. Check for: More redness, swelling, or pain. More fluid or blood. Warmth. Pus or a bad smell. General instructions  Raise (elevate) the injured area above the level of your heart while you are sitting or lying down. If told, put ice on the injured area. To do this: Put ice in a plastic bag. Place a towel between your skin and the bag. Leave the ice on for 20 minutes, 2-3 times per day. Take off the ice if your skin turns bright red. This is very important. If you cannot feel pain, heat, or cold, you have a greater risk of damage to the area. Keep all follow-up visits. Contact a doctor if: You have more redness, swelling, or pain around your wound. Your wound feels warm to the touch. You have a fever or chills. You have a general feeling of sickness (malaise). You feel like you may vomit. You vomit. You have pain that does not get better. Get help right away if: You have a red streak going away from your wound. You have any of these coming from your wound: Non-clear fluid. More blood. Pus or a bad smell. You have trouble moving your injured area. You lose feeling (have numbness) or feel tingling anywhere on your body. Summary Animal bites can be mild or serious. If a stray or wild animal bites you, you need to get medical help right away. Your doctor will look at the wound and may ask about how the animal bite happened. Treatment may include wound care, antibiotic medicine, a tetanus shot, and rabies treatment. This information is not intended to replace advice given to you by your health care provider. Make sure you discuss any questions you have with your health care provider. Document Revised: 01/30/2021 Document Reviewed: 01/30/2021 Elsevier Patient Education  2024 Elsevier Inc.    Emil Schaumann, MD [facility] at [facility]

## 2023-12-13 NOTE — Telephone Encounter (Signed)
 Patient requested appointment time later today at 3:40pm due to other prior plans  FYI Only or Action Required?: FYI only for provider: appointment scheduled on 12/13/2023 at 3:40pm with Dr Purcell.  Patient was last seen in primary care on 11/03/2023 by Joshua Debby CROME, MD.  Called Nurse Triage reporting Animal Bite.  Symptoms began yesterday.  Interventions attempted: Other: cleaned the area well per patient.  Symptoms are: gradually worsening.  Triage Disposition: See HCP Within 4 Hours (Or PCP Triage)  Patient/caregiver understands and will follow disposition?: Yes       Copied from CRM #[id number]. Topic: Clinical - Red Word Triage >> Dec 13, 2023  9:12 AM Alexandria E wrote: [facility] that prompted transfer to Nurse Triage: Cat bite. Patient was bit by her cat yesterday on the back of her left hand. Now looking infected, swollen and turning red. Reason for Disposition  [1] Puncture wound (hole through the skin) AND [2] from a cat bite (or deep claw puncture wound)  Answer Assessment - Initial Assessment Questions Patient is having surgery a week from today & wants to make sure this cat bite doesn't become infected & cause issues having that surgery She states that her pet cat has had all it's shots but bit the top of her left hand yesterday Patient is advised to call us  back with any further questions or concerns.    1. APPEARANCE What does it look like?  (e.g., abrasion, bruise, puncture)      Swollen and red 2. SIZE: How big is the bite? (e.g., inches, cm; or compare to size of coin, pea, grape, ping pong ball)      About an inch and 1/4 inch of swelling 3. LOCATION: Where is the bite located?      Top of left hand in the middle near the knuckles 4. ONSET: When did the bite happen? (e.g., minutes, hours ago)      Yesterday late afternoon 5. ANIMAL: What type of animal caused the bite? Is the injury from a bite or a claw? If the animal is a dog or a cat, ask:  Was it a pet or a stray? Was it acting ill or behaving strangely?     Pet cat--bite 6. RABIES VACCINE: For dog or cat bites, ask: Do you know if the pet is vaccinated against rabies?  (e.g., yes, no, overdue for rabies shot, unknown)     Patient states that the cat has had it's shots 7. CIRCUMSTANCES: Tell me how this happened.      About an inch and 1/4 inch of swelling 8. TETANUS: When was your last tetanus booster?     Sometime in the more recent past--possibly within the last year  Protocols used: Animal Bite-A-AH

## 2023-12-13 NOTE — Patient Instructions (Signed)
Animal Bite, Adult Animal bites can be mild or serious. Small bites from house pets normally are mild. Bites from cats, strays, or wild animals can be serious. If a stray or wild animal bites you, you need to get medical help right away. You may also need a shot to prevent rabies infection. What increases the risk? Being near pets you do not know. Being near animals that are eating, sleeping, or caring for their babies. Being outside where small, wild animals move freely. What are the signs or symptoms? Pain. Bleeding. Swelling. Bruising. How is this treated? Treatment may include: Cleaning your wound. Rinsing out (flushing) your wound. This uses saline solution, which is made of salt and water. Putting a bandage on your wound. Closing your wound with stitches (sutures), staples, skin glue, or skin tape (adhesive strips). Antibiotic medicine. You may be given pills, cream, gel, or fluid through an IV. A tetanus shot. Rabies treatment, if the animal could have rabies. Surgery, if there is infection or damage that needs to be fixed. Follow these instructions at home: Medicines Take or apply over-the-counter and prescription medicines only as told by your doctor. If you were prescribed an antibiotic medicine, take or apply it as told by your doctor. Do not stop using it even if your wound gets better. Wound care  Follow instructions from your doctor about how to take care of your wound. Make sure you: Wash your hands with soap and water for at least 20 seconds before and after you change your bandage. If you cannot use soap and water, use hand sanitizer. Change your bandage. Leave stitches or skin glue in place for at least 2 weeks. Leave tape strips alone unless you are told to take them off. You may trim the edges of the tape strips if they curl up. Check your wound every day for signs of infection. Check for: More redness, swelling, or pain. More fluid or blood. Warmth. Pus or a  bad smell. General instructions  Raise (elevate) the injured area above the level of your heart while you are sitting or lying down. If told, put ice on the injured area. To do this: Put ice in a plastic bag. Place a towel between your skin and the bag. Leave the ice on for 20 minutes, 2-3 times per day. Take off the ice if your skin turns bright red. This is very important. If you cannot feel pain, heat, or cold, you have a greater risk of damage to the area. Keep all follow-up visits. Contact a doctor if: You have more redness, swelling, or pain around your wound. Your wound feels warm to the touch. You have a fever or chills. You have a general feeling of sickness (malaise). You feel like you may vomit. You vomit. You have pain that does not get better. Get help right away if: You have a red streak going away from your wound. You have any of these coming from your wound: Non-clear fluid. More blood. Pus or a bad smell. You have trouble moving your injured area. You lose feeling (have numbness) or feel tingling anywhere on your body. Summary Animal bites can be mild or serious. If a stray or wild animal bites you, you need to get medical help right away. Your doctor will look at the wound and may ask about how the animal bite happened. Treatment may include wound care, antibiotic medicine, a tetanus shot, and rabies treatment. This information is not intended to replace advice given to you   by your health care provider. Make sure you discuss any questions you have with your health care provider. Document Revised: 01/30/2021 Document Reviewed: 01/30/2021 Elsevier Patient Education  2024 [employer].  

## 2023-12-15 NOTE — Telephone Encounter (Signed)
 Noted. Patient was seen.

## 2023-12-17 ENCOUNTER — Other Ambulatory Visit: Payer: Self-pay | Admitting: Internal Medicine

## 2023-12-17 DIAGNOSIS — K582 Mixed irritable bowel syndrome: Secondary | ICD-10-CM

## 2023-12-18 ENCOUNTER — Other Ambulatory Visit: Payer: Self-pay | Admitting: Internal Medicine

## 2023-12-18 DIAGNOSIS — R000 Tachycardia, unspecified: Secondary | ICD-10-CM

## 2023-12-18 DIAGNOSIS — I493 Ventricular premature depolarization: Secondary | ICD-10-CM

## 2023-12-19 ENCOUNTER — Encounter: Payer: Self-pay | Admitting: Orthopedic Surgery

## 2023-12-19 ENCOUNTER — Ambulatory Visit: Admitting: Orthopedic Surgery

## 2023-12-19 DIAGNOSIS — M19011 Primary osteoarthritis, right shoulder: Secondary | ICD-10-CM

## 2023-12-19 NOTE — Progress Notes (Unsigned)
 Office Visit Note   Patient: [name]           Date of Birth: Jul 17, 1955           MRN: [id number] Visit Date: 12/19/2023 Requested by: Joshua Debby CROME, MD [address] [place],  [place] [address] PCP: Joshua Debby CROME, MD  Subjective: Chief Complaint  Patient presents with   Right Shoulder - Follow-up    HPI: [name] is a 68 y.o. female who presents to the office reporting for follow-up of right shoulder pain.  Patient states she has good and bad days.  At times she functions pretty well.  She had a right shoulder glenohumeral joint injection performed 03/18/2023 which was not particular helpful.  MRI scan of the right shoulder in 1224 demonstrated glenohumeral arthritis and AC joint arthritis biceps tendon almost torn with rotator cuff tendinosis.  Hurts for her to sleep on that right-hand side.  Tizanidine  helps.  She was on disability but then she retired.                ROS: All systems reviewed are negative as they relate to the chief complaint within the history of present illness.  Patient denies fevers or chills.  Assessment & Plan: Visit Diagnoses: No diagnosis found.  Plan: Impression is right shoulder pain.  At this time patient wants to hold off on any type of intervention for her shoulder.  Injection did not help.  I think if she ever has anything done it would be replacement.  She will follow-up with us  as needed.  Follow-Up Instructions: No follow-ups on file.   Orders:  No orders of the defined types were placed in this encounter.  No orders of the defined types were placed in this encounter.     Procedures: No procedures performed   Clinical Data: No additional findings.  Objective: Vital Signs: LMP 03/05/2012 (Approximate)   Physical Exam:  Constitutional: Patient appears well-developed HEENT:  Head: Normocephalic Eyes:EOM are normal Neck: Normal range of motion Cardiovascular: Normal rate Pulmonary/chest: Effort  normal Neurologic: Patient is alert Skin: Skin is warm Psychiatric: Patient has normal mood and affect  Ortho Exam: Ortho exam demonstrates range of motion of 60/100/155.  No discrete AC joint tenderness right versus left.  Cervical spine range of motion intact.  Does have some coarseness with passive range of motion more consistent with arthritis as opposed to rotator cuff pathology.  No Popeye deformity but she does have positive O'Brien's testing.  Specialty Comments:  No specialty comments available.  Imaging: No results found.   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 03/25/2023   Tachycardia 03/24/2023   Type 2 diabetes mellitus with stage 3b chronic kidney disease, without long-term current use of insulin (HCC) 10/26/2022   Type 2 diabetes mellitus with stage 3a chronic kidney disease, without long-term current use of insulin (HCC) 07/09/2022   Hyperlipidemia LDL goal <100 08/24/2021   Encounter for general adult medical examination with abnormal findings 03/14/2021   Estrogen deficiency 03/09/2021   Cervical cancer screening 03/09/2021   Cat bite 09/03/2020   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   Grade I diastolic dysfunction 03/12/2020   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   Osteoarthritis of glenohumeral joint 03/26/2019   Iron deficiency anemia 04/28/2018   OA (osteoarthritis) of knee 12/26/2017   Vitamin D  deficiency, unspecified 10/13/2016   Primary osteoarthritis of  left hand 06/30/2016   Osteoporosis without current pathological fracture 03/22/2016   Paraesophageal hiatal hernia s/p robotic repair & fundoplication 03/04/2016 03/04/2016   Bipolar 1 disorder, mixed, moderate (HCC) 01/29/2016   Class 1 obesity with body mass index (BMI) of 32.0 to 32.9 in adult 01/06/2016   Migraines 11/21/2014   Sleep apnea 01/30/2007   GERD 11/02/2006   Hypothyroidism 10/07/2006    Bipolar affective (HCC) 10/07/2006   Essential hypertension 10/07/2006   RHINITIS, CHRONIC 10/07/2006   Seasonal and perennial allergic rhinitis 10/07/2006   Moderate persistent asthma, uncomplicated 10/07/2006   Irritable bowel syndrome 10/07/2006   INTERSTITIAL CYSTITIS 10/07/2006   ROSACEA 10/07/2006   Osteoarthritis of both knees 10/07/2006   Past Medical History:  Diagnosis Date   Allergic rhinitis    Anemia    Anxiety    Benign essential tremor    hands   Bipolar 1 disorder, mixed, moderate (HCC)    Claustrophobia    Depression    Diabetes (HCC)    Eczema    Frequency of urination    GERD (gastroesophageal reflux disease)    History of frequent urinary tract infections    History of gastroesophageal reflux (GERD)    05-25-2017  per pt no issues since hiatal hernia repair 01/ 2018   History of hiatal hernia    History of melanoma excision    early 2000s-- BACK   History of panic attacks    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   Hypothyroidism    Intermittent palpitations    cardiology--  dr jordan   Interstitial cystitis    Limited jaw range of motion    s/p  bilateral TMJ surgery,  age 53s   Migraines    Moderate persistent asthma    pulmologist-- dr shellia   OA (osteoarthritis)    both knees   OSA on    per last sleep study 09/ 2017  mild OSA, AHI13/hr   PONV (postoperative nausea and vomiting)    PVC (premature ventricular contraction)    Rosacea    Sensation of pressure in bladder area    per pt intermittant   Squamous cell carcinoma, ear    TMJ arthralgia    Urticaria    Wears glasses    White coat syndrome without hypertension    05-25-2017  per pt hx hypertension yrs ago, no issues after quitting stressful job    Family History  Problem Relation Age of Onset   Hypertension Mother        deceased from MVA complications   Thyroid  disease Mother    Allergic rhinitis Mother    Testicular cancer Father    Allergic  rhinitis Father    Colon cancer Sister    Colon polyps Sister    Healthy Sister    Healthy Brother    Coronary artery disease Other    Stomach cancer Neg Hx     Past Surgical History:  Procedure Laterality Date   33 HOUR PH STUDY N/A 09/22/2015   Procedure: 24 HOUR PH STUDY;  Surgeon: Victory LITTIE Legrand DOUGLAS, MD;  Location: [facility];  Service: Gastroenterology;  Laterality: N/A;   ADENOIDECTOMY     ANAL FISSURE REPAIR  age 42  (64)   BREAST EXCISIONAL BIOPSY Right 04-16-2003  dr p. young  [facility]   benign   BUNIONECTOMY Right early 2000s   CARPAL TUNNEL RELEASE Left age 28 (87)   CHOLECYSTECTOMY OPEN  age 80  CYSTO WITH HYDRODISTENSION N/A 06/02/2017   Procedure: CYSTOSCOPY/HYDRODISTENSION OF BLADDER;  Surgeon: Watt Rush, MD;  Location: [facility];  Service: Urology;  Laterality: N/A;   CYSTO/ HYDRODISTENTION/  INSTILLATION THERAPY  1990s   DX LAPAROSCOPY/  DX HYSTEROSCOPY/  D & C  08-07-2007   dr dove  [facility]   ENDOMETRIAL ABLATION W/ NOVASURE  10-21-2008   dr herchel  [facility]   ESOPHAGEAL MANOMETRY N/A 09/22/2015   Procedure: ESOPHAGEAL MANOMETRY (EM);  Surgeon: Victory LITTIE Legrand DOUGLAS, MD;  Location: [facility];  Service: Gastroenterology;  Laterality: N/A;  with impedence    FINGER ARTHROPLASTY Bilateral    left little finger 2016:  right middle finger 06/ 2018   IMPLANTATION OF HYPOGLOSSAL NERVE STIMULATOR Right 03/09/2023   Procedure: RIGHT IMPLANTATION OF HYPOGLOSSAL NERVE STIMULATOR;  Surgeon: Carlie Clark, MD;  Location: [facility];  Service: ENT;  Laterality: Right;   IMPLANTATION OF HYPOGLOSSAL NERVE STIMULATOR Bilateral 08/09/2023   Procedure: REVISION CAPSULOTOMY;  Surgeon: Carlie Clark, MD;  Location: [facility];  Service: ENT;  Laterality: Bilateral;   KNEE ARTHROSCOPY Bilateral x3 left ;  x3  right-- last one age 70 (67)   NASAL SEPTUM SURGERY  age 30s   ROBOT ASSISTED REDUCTION PARAESOPHAGEAL HIATAL HERNIA/ TYPE 2 MEDIASTINAL  DISSECTION/ PRIMARY HIATAL HERNIA REPAIR/ ANTERIOR & POSTERIOR GASTROPEXY/ NISSEN FUNDOPLICATION  03-04-2017   DR GROSS  [facility]   SINOSCOPY     TEMPOROMANDIBULAR JOINT SURGERY Bilateral x3  -- age 30s   per pt used graft   TONSILLECTOMY     TOTAL KNEE ARTHROPLASTY Left 12/26/2017   Procedure: LEFT TOTAL KNEE ARTHROPLASTY;  Surgeon: Melodi Lerner, MD;  Location: [facility];  Service: Orthopedics;  Laterality: Left;    TRANSTHORACIC ECHOCARDIOGRAM  12-06-2017   dr jordan   ef 55-60%, grade 1 diastoilc dysfunction, trivial MR   TRIGGER FINGER RELEASE Bilateral last one 2017   several release's bilaterally   ULNAR NERVE TRANSPOSITION Bilateral age 29 (56)   Social History   Occupational History   Occupation: disability  Tobacco Use   Smoking status: Never    Passive exposure: Never   Smokeless tobacco: Never  Vaping Use   Vaping status: Never Used  Substance and Sexual Activity   Alcohol use: Yes    Alcohol/week: 0.0 standard drinks of alcohol    Comment: once every 3-4 months   Drug use: No   Sexual activity: Not Currently    Partners: Male    Birth control/protection: Post-menopausal    Comment: ablation

## 2023-12-20 ENCOUNTER — Ambulatory Visit (HOSPITAL_COMMUNITY): Payer: Self-pay | Admitting: Physician Assistant

## 2023-12-20 ENCOUNTER — Encounter (HOSPITAL_COMMUNITY): Payer: Self-pay | Admitting: Urology

## 2023-12-20 ENCOUNTER — Ambulatory Visit (HOSPITAL_COMMUNITY)
Admission: RE | Admit: 2023-12-20 | Discharge: 2023-12-20 | Disposition: A | Discharge status: Home / Self Care | Attending: Urology | Admitting: Urology

## 2023-12-20 ENCOUNTER — Ambulatory Visit (HOSPITAL_BASED_OUTPATIENT_CLINIC_OR_DEPARTMENT_OTHER)

## 2023-12-20 ENCOUNTER — Encounter (HOSPITAL_COMMUNITY)
Admission: RE | Disposition: A | Discharge status: Home / Self Care | Payer: Self-pay | Source: Home / Self Care | Attending: Urology

## 2023-12-20 ENCOUNTER — Other Ambulatory Visit: Payer: Self-pay

## 2023-12-20 DIAGNOSIS — N3281 Overactive bladder: Secondary | ICD-10-CM | POA: Insufficient documentation

## 2023-12-20 DIAGNOSIS — E119 Type 2 diabetes mellitus without complications: Secondary | ICD-10-CM | POA: Insufficient documentation

## 2023-12-20 DIAGNOSIS — I10 Essential (primary) hypertension: Secondary | ICD-10-CM | POA: Insufficient documentation

## 2023-12-20 DIAGNOSIS — J45909 Unspecified asthma, uncomplicated: Secondary | ICD-10-CM | POA: Insufficient documentation

## 2023-12-20 DIAGNOSIS — G4730 Sleep apnea, unspecified: Secondary | ICD-10-CM | POA: Insufficient documentation

## 2023-12-20 DIAGNOSIS — K219 Gastro-esophageal reflux disease without esophagitis: Secondary | ICD-10-CM | POA: Insufficient documentation

## 2023-12-20 DIAGNOSIS — N3010 Interstitial cystitis (chronic) without hematuria: Secondary | ICD-10-CM

## 2023-12-20 DIAGNOSIS — E039 Hypothyroidism, unspecified: Secondary | ICD-10-CM | POA: Insufficient documentation

## 2023-12-20 HISTORY — PX: CYSTO WITH HYDRODISTENSION: SHX5453

## 2023-12-20 LAB — GLUCOSE, CAPILLARY
Glucose-Capillary: 146 mg/dL — ABNORMAL HIGH (ref 70–99)
Glucose-Capillary: 98 mg/dL (ref 70–99)

## 2023-12-20 SURGERY — CYSTOSCOPY, WITH BLADDER HYDRODISTENSION
Anesthesia: General

## 2023-12-20 MED ORDER — SODIUM CHLORIDE 0.9% FLUSH: 3.0000 mL | INTRAVENOUS | Status: DC | PRN

## 2023-12-20 MED ORDER — HYDROCODONE-ACETAMINOPHEN 5-325 MG PO TABS: 1.0000 | ORAL_TABLET | Freq: Four times a day (QID) | ORAL | 0 refills | Status: DC | PRN

## 2023-12-20 MED ORDER — SODIUM CHLORIDE 0.9% FLUSH: 3.0000 mL | Freq: Two times a day (BID) | INTRAVENOUS | Status: DC

## 2023-12-20 MED ORDER — ONDANSETRON HCL 4 MG/2ML IJ SOLN
INTRAMUSCULAR | Status: AC
  Filled 2023-12-20: qty 2

## 2023-12-20 MED ORDER — AMISULPRIDE (ANTIEMETIC) 5 MG/2ML IV SOLN: 10.0000 mg | Freq: Once | INTRAVENOUS | Status: DC | PRN

## 2023-12-20 MED ORDER — LACTATED RINGERS IV SOLN: INTRAVENOUS | Status: DC

## 2023-12-20 MED ORDER — DEXAMETHASONE SOD PHOSPHATE PF 10 MG/ML IJ SOLN
INTRAMUSCULAR | Status: DC | PRN
  Administered 2023-12-20: 10 mg via INTRAVENOUS

## 2023-12-20 MED ORDER — INSULIN ASPART 100 UNIT/ML IJ SOLN
0.0000 [IU] | INTRAMUSCULAR | Status: DC | PRN
  Administered 2023-12-20: 2 [IU] via SUBCUTANEOUS
  Filled 2023-12-20: qty 2

## 2023-12-20 MED ORDER — ONDANSETRON HCL 4 MG/2ML IJ SOLN
INTRAMUSCULAR | Status: DC | PRN
  Administered 2023-12-20: 4 mg via INTRAVENOUS

## 2023-12-20 MED ORDER — LIDOCAINE HCL (PF) 2 % IJ SOLN
INTRAMUSCULAR | Status: AC
  Filled 2023-12-20: qty 5

## 2023-12-20 MED ORDER — ACETAMINOPHEN 650 MG RE SUPP: 650.0000 mg | RECTAL | Status: DC | PRN

## 2023-12-20 MED ORDER — OXYCODONE HCL 5 MG PO TABS: 5.0000 mg | ORAL_TABLET | Freq: Once | ORAL | Status: DC | PRN

## 2023-12-20 MED ORDER — FENTANYL CITRATE (PF) 50 MCG/ML IJ SOSY: 25.0000 ug | PREFILLED_SYRINGE | INTRAMUSCULAR | Status: DC | PRN

## 2023-12-20 MED ORDER — FENTANYL CITRATE (PF) 100 MCG/2ML IJ SOLN
INTRAMUSCULAR | Status: AC
  Filled 2023-12-20: qty 2

## 2023-12-20 MED ORDER — STERILE WATER FOR IRRIGATION IR SOLN
Status: DC | PRN
Start: 2023-12-20 — End: 2023-12-20
  Administered 2023-12-20: 3000 mL

## 2023-12-20 MED ORDER — ORAL CARE MOUTH RINSE: 15.0000 mL | Freq: Once | OROMUCOSAL | Status: DC

## 2023-12-20 MED ORDER — ROCURONIUM BROMIDE 10 MG/ML (PF) SYRINGE
PREFILLED_SYRINGE | INTRAVENOUS | Status: AC
  Filled 2023-12-20: qty 10

## 2023-12-20 MED ORDER — ACETAMINOPHEN 325 MG PO TABS: 650.0000 mg | ORAL_TABLET | ORAL | Status: DC | PRN

## 2023-12-20 MED ORDER — MIDAZOLAM HCL (PF) 2 MG/2ML IJ SOLN
INTRAMUSCULAR | Status: DC | PRN
  Administered 2023-12-20: 2 mg via INTRAVENOUS

## 2023-12-20 MED ORDER — MIDAZOLAM HCL 2 MG/2ML IJ SOLN
INTRAMUSCULAR | Status: AC
Start: 2023-12-20 — End: 2023-12-20
  Filled 2023-12-20: qty 2

## 2023-12-20 MED ORDER — PROPOFOL 500 MG/50ML IV EMUL
INTRAVENOUS | Status: DC | PRN
  Administered 2023-12-20: 50 mg via INTRAVENOUS
  Administered 2023-12-20: 150 mg via INTRAVENOUS

## 2023-12-20 MED ORDER — ONDANSETRON HCL 4 MG/2ML IJ SOLN: 4.0000 mg | Freq: Once | INTRAMUSCULAR | Status: DC | PRN

## 2023-12-20 MED ORDER — CHLORHEXIDINE GLUCONATE 0.12 % MT SOLN: 15.0000 mL | Freq: Once | OROMUCOSAL | Status: DC

## 2023-12-20 MED ORDER — FENTANYL CITRATE (PF) 100 MCG/2ML IJ SOLN
INTRAMUSCULAR | Status: DC | PRN
  Administered 2023-12-20: 50 ug via INTRAVENOUS

## 2023-12-20 MED ORDER — OXYCODONE HCL 5 MG PO TABS: 5.0000 mg | ORAL_TABLET | ORAL | Status: DC | PRN

## 2023-12-20 MED ORDER — SUCCINYLCHOLINE CHLORIDE 200 MG/10ML IV SOSY
PREFILLED_SYRINGE | INTRAVENOUS | Status: AC
  Filled 2023-12-20: qty 10

## 2023-12-20 MED ORDER — SODIUM CHLORIDE 0.9 % IV SOLN
250.0000 mL | INTRAVENOUS | Status: DC | PRN
Start: 2023-12-20 — End: 2023-12-20

## 2023-12-20 MED ORDER — OXYCODONE HCL 5 MG/5ML PO SOLN: 5.0000 mg | Freq: Once | ORAL | Status: DC | PRN

## 2023-12-20 MED ORDER — CEFAZOLIN SODIUM-DEXTROSE 2-4 GM/100ML-% IV SOLN
2.0000 g | INTRAVENOUS | Status: AC
  Administered 2023-12-20: 2 g via INTRAVENOUS
  Filled 2023-12-20: qty 100

## 2023-12-20 MED ORDER — LACTATED RINGERS IV SOLN: INTRAVENOUS | Status: DC | PRN

## 2023-12-20 MED ORDER — ACETAMINOPHEN 10 MG/ML IV SOLN
1000.0000 mg | Freq: Once | INTRAVENOUS | Status: DC | PRN
Start: 2023-12-20 — End: 2023-12-20

## 2023-12-20 SURGICAL SUPPLY — 17 items
BAG URO CATCHER STRL LF (MISCELLANEOUS) ×1 IMPLANT
CATH FOLEY 2WAY 5CC 20FR (CATHETERS) ×1 IMPLANT
CATH ROBINSON RED A/P 16FR (CATHETERS) ×1 IMPLANT
CATH ROBINSON RED A/P 18FR (CATHETERS) IMPLANT
CATH URETL OPEN 5X70 (CATHETERS) IMPLANT
GLOVE SURG SS PI 8.0 STRL IVOR (GLOVE) IMPLANT
GOWN STRL SURGICAL XL XLNG (GOWN DISPOSABLE) ×1 IMPLANT
KIT TURNOVER KIT A (KITS) ×1 IMPLANT
MANIFOLD NEPTUNE II (INSTRUMENTS) ×1 IMPLANT
NDL HYPO 22X1.5 SAFETY MO (MISCELLANEOUS) IMPLANT
NDL SAFETY ECLIPSE 18X1.5 (NEEDLE) IMPLANT
NEEDLE HYPO 22X1.5 SAFETY MO (MISCELLANEOUS) IMPLANT
PACK CYSTO (CUSTOM PROCEDURE TRAY) ×1 IMPLANT
PLUG CATH AND CAP STRL 200 (CATHETERS) ×1 IMPLANT
SYR BULB IRRIG 60ML STRL (SYRINGE) IMPLANT
TUBING CONNECTING 10 (TUBING) IMPLANT
WATER STERILE IRR 1000ML POUR (IV SOLUTION) IMPLANT

## 2023-12-20 NOTE — Transfer of Care (Signed)
 Immediate Anesthesia Transfer of Care Note  Patient: [name]  Procedure(s) Performed: CYSTOSCOPY, WITH BLADDER HYDRODISTENSION  Patient Location: PACU  Anesthesia Type:General  Level of Consciousness: sedated and responds to stimulation  Airway & Oxygen  Therapy: Patient Spontanous Breathing and Patient connected to face mask oxygen   Post-op Assessment: Report given to RN and Post -op Vital signs reviewed and stable  Post vital signs: Reviewed and stable  Last Vitals:  Vitals Value Taken Time  BP 94/50 12/20/23 10:55  Temp 36.4 C 12/20/23 10:58  Pulse 66 12/20/23 10:59  Resp 14 12/20/23 10:59  SpO2 99 % 12/20/23 10:59  Vitals shown include unfiled device data.  Last Pain:  Vitals:   12/20/23 0824  TempSrc:   PainSc: 0-No pain         Complications: No notable events documented.

## 2023-12-20 NOTE — Discharge Instructions (Addendum)
 CYSTOSCOPY HOME CARE INSTRUCTIONS  Activity: Rest for the remainder of the day.  Do not drive or operate equipment today.  You may resume normal activities in one to two days as instructed by your physician.   Meals: Drink plenty of liquids and eat light foods such as gelatin or soup this evening.  You may return to a normal meal plan tomorrow.  Return to Work: You may return to work in one to two days or as instructed by your physician.  Special Instructions / Symptoms: Call your physician if any of these symptoms occur:   -persistent or heavy bleeding  -bleeding which continues after first few urination  -large blood clots that are difficult to pass  -urine stream diminishes or stops completely  -fever equal to or higher than 101 degrees Farenheit.  -cloudy urine with a strong, foul odor  -severe pain  Females should always wipe from front to back after elimination.  You may feel some burning pain when you urinate.  This should disappear with time.  Applying moist heat to the lower abdomen or a hot tub bath may help relieve the pain. \   Patient Signature:  ________________________________________________________  Nurse's Signature:  ________________________________________________________

## 2023-12-20 NOTE — Anesthesia Postprocedure Evaluation (Signed)
 Anesthesia Post Note  Patient: [name]  Procedure(s) Performed: CYSTOSCOPY, WITH BLADDER HYDRODISTENSION     Patient location during evaluation: PACU Anesthesia Type: General Level of consciousness: awake Pain management: pain level controlled Vital Signs Assessment: post-procedure vital signs reviewed and stable Respiratory status: spontaneous breathing Cardiovascular status: blood pressure returned to baseline Postop Assessment: no apparent nausea or vomiting Anesthetic complications: no   No notable events documented.  Last Vitals:  Vitals:   12/20/23 1115 12/20/23 1130  BP:  116/76  Pulse:  79  Resp:  20  Temp: (!) 36.4 C (!) 36.4 C  SpO2:  93%    Last Pain:  Vitals:   12/20/23 1130  TempSrc: Oral  PainSc: 2                  Lauraine KATHEE [place]

## 2023-12-20 NOTE — Anesthesia Preprocedure Evaluation (Addendum)
 Anesthesia Evaluation  Patient identified by MRN, date of birth, ID band Patient awake    Reviewed: Allergy & Precautions, NPO status , Patient's Chart, lab work & pertinent test results, reviewed documented beta blocker date and time   History of Anesthesia Complications (+) PONV and history of anesthetic complications  Airway Mallampati: III  TM Distance: >3 FB Neck ROM: Full  Mouth opening: Limited Mouth Opening  Dental  (+) Teeth Intact, Dental Advisory Given   Pulmonary asthma , sleep apnea    breath sounds clear to auscultation       Cardiovascular hypertension (SBP in 160s), Pt. on medications and Pt. on home beta blockers  Rhythm:Regular Rate:Tachycardia  EKG (2025): Normal sinus rhythm  TTE (2019): Normal biventricular function.   Neuro/Psych  Headaches PSYCHIATRIC DISORDERS Anxiety Depression       GI/Hepatic hiatal hernia (s/p Repair),GERD  ,,  Endo/Other  diabetes (A1c of 5.7), Type 2Hypothyroidism    Renal/GU Renal disease Bladder dysfunction (Interstitial Cystitis)      Musculoskeletal   Abdominal   Peds  Hematology  (+) Blood dyscrasia, anemia Hgb 13.9, Plts 191K (12/13/23)   Anesthesia Other Findings   Reproductive/Obstetrics                              Anesthesia Physical Anesthesia Plan  ASA: 2  Anesthesia Plan: General   Post-op Pain Management:    Induction: Intravenous  PONV Risk Score and Plan: 4 or greater and Ondansetron , Treatment may vary due to age or medical condition and Propofol  infusion  Airway Management Planned: Video Laryngoscope Planned and Oral ETT  Additional Equipment:   Intra-op Plan:   Post-operative Plan: Extubation in OR  Informed Consent:      Dental advisory given  Plan Discussed with: CRNA  Anesthesia Plan Comments:          Anesthesia Quick Evaluation

## 2023-12-20 NOTE — OR Nursing (Signed)
 Patient glasses and inspire remote sent to OR with patient and then sent to PACU

## 2023-12-20 NOTE — Anesthesia Procedure Notes (Signed)
 Procedure Name: LMA Insertion Date/Time: 12/20/2023 10:20 AM  Performed by: Obadiah Reyes BROCKS, CRNAPre-anesthesia Checklist: Patient identified, Emergency Drugs available, Suction available and Patient being monitored Patient Re-evaluated:Patient Re-evaluated prior to induction Oxygen  Delivery Method: Circle System Utilized Preoxygenation: Pre-oxygenation with 100% oxygen  Induction Type: IV induction Ventilation: Mask ventilation without difficulty LMA: LMA inserted LMA Size: 3.0 Number of attempts: 1 Airway Equipment and Method: Bite block Placement Confirmation: positive ETCO2 Tube secured with: Tape Dental Injury: Teeth and Oropharynx as per pre-operative assessment

## 2023-12-20 NOTE — OR Nursing (Signed)
 Earrings noted at induction per anesthesia. After moving patient to stretcher both earrings were out of ears but laying on bed. Backs of earrings were not found.

## 2023-12-20 NOTE — Interval H&P Note (Signed)
 History and Physical Interval Note: No change.   12/20/2023 9:58 AM  [name]  has presented today for surgery, with the diagnosis of INTERSTITIAL CYSTITIS.  The various methods of treatment have been discussed with the patient and family. After consideration of risks, benefits and other options for treatment, the patient has consented to  Procedure(s): CYSTOSCOPY, WITH BLADDER HYDRODISTENSION (N/A) as a surgical intervention.  The patient's history has been reviewed, patient examined, no change in status, stable for surgery.  I have reviewed the patient's chart and labs.  Questions were answered to the patient's satisfaction.     Amarilys Lyles

## 2023-12-20 NOTE — Op Note (Signed)
 Procedure: Cystoscopy with hydrodistention of the bladder.  Pre-op diagnosis: Interstitial cystitis with bladder pain.  Postop diagnosis: Same.  Surgeon: Dr. Norleen Seltzer.  Anesthesia: General.  Specimen: None.  Drains: None.  EBL: None.  Complications: None.  Indications: The patient is a 68 year old female with a history of interstitial cystitis and overactive bladder who has failed medical therapy and is elected to undergo hydrodistention of bladder which she has had successfully in the past.  Procedure: She was taken the operating room where she given Ancef .  A general anesthetic was induced.  She was placed in lithotomy position and food PSIs.  Her perineum and genitalia were prepped with Betadine solution she was draped in usual sterile fashion.  Cystoscopy was performed using the 21 French scope and 30 degree lens.  Examination revealed a normal urethra.  The bladder wall had normal mucosa with mild trabeculation but no tumors, stones or inflammation.  Ureteral orifices were unremarkable.  The bladder was then distended 80 cm water  pressure to capacity and then drained.  Her capacity under anesthesia was 1000 mL and after drainage there were a few glomerular hemorrhages identified although not extensive.  She did not receive an instillation of local anesthetic into the bladder because of allergies.  She was taken down from lithotomy position, her anesthetic was reversed and she was moved to recovery in stable condition.  There were no complications.

## 2023-12-20 NOTE — H&P (Signed)
 Subjective: 12/20/23: [name] is a 68 yo female with a history of IC and OAB who has recurrent bladder discomfort and has requested cystoscopy with HOD for treatment.  ROS:  Review of Systems  All other systems reviewed and are negative.   Allergies  Allergen Reactions   Emetrol Itching   Indomethacin Other (See Comments)    Muscle spasms Causes muscle spasms in neck   Iodinated Contrast Media Itching    Flushed and Fever, itch all over   Amlodipine  Swelling    Peripheral edema   Carbamazepine  Other (See Comments)    Easy and unusual bleeding    Pseudoephedrine Other (See Comments)    I fly off the walls  CANNOT SLEEP   Risperidone And Paliperidone Other (See Comments)    Stomach upset, insomnia, drooling, tremors, jerks, sensitivity to touch.    Bupivacaine Hives   Pentazocine Other (See Comments)    MADE ME LACTATE   Propoxyphene Itching and Nausea And Vomiting    darvocet   Sulfa Antibiotics Other (See Comments)    Unknown reaction   Tramadol  Other (See Comments)    Patient can not remember   Aspirin Other (See Comments)    upset stomach, ringing in the ears   Codeine Itching and Other (See Comments)    Anything related to codeine   Etodolac Rash and Other (See Comments)   Propranolol Nausea Only    Past Medical History:  Diagnosis Date   Allergic rhinitis    Anemia    Anxiety    Benign essential tremor    hands   Bipolar 1 disorder, mixed, moderate (HCC)    Claustrophobia    Depression    Diabetes (HCC)    Eczema    Frequency of urination    GERD (gastroesophageal reflux disease)    History of frequent urinary tract infections    History of gastroesophageal reflux (GERD)    05-25-2017  per pt no issues since hiatal hernia repair 01/ 2018   History of hiatal hernia    History of melanoma excision    early 2000s-- BACK   History of panic attacks    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   Hypothyroidism    Intermittent  palpitations    cardiology--  dr jordan   Interstitial cystitis    Limited jaw range of motion    s/p  bilateral TMJ surgery,  age 46s   Migraines    Moderate persistent asthma    pulmologist-- dr shellia   OA (osteoarthritis)    both knees   OSA on    per last sleep study 09/ 2017  mild OSA, AHI13/hr   PONV (postoperative nausea and vomiting)    PVC (premature ventricular contraction)    Rosacea    Sensation of pressure in bladder area    per pt intermittant   Squamous cell carcinoma, ear    TMJ arthralgia    Urticaria    Wears glasses    White coat syndrome without hypertension    05-25-2017  per pt hx hypertension yrs ago, no issues after quitting stressful job    Past Surgical History:  Procedure Laterality Date   24 HOUR PH STUDY N/A 09/22/2015   Procedure: 24 HOUR PH STUDY;  Surgeon: Victory LITTIE Legrand DOUGLAS, MD;  Location: [facility];  Service: Gastroenterology;  Laterality: N/A;   ADENOIDECTOMY     ANAL FISSURE REPAIR  age 68  (15)   BREAST EXCISIONAL BIOPSY  Right 04-16-2003  dr p. young  [facility]   benign   BUNIONECTOMY Right early 2000s   CARPAL TUNNEL RELEASE Left age 10 (47)   CHOLECYSTECTOMY OPEN  age 76   CYSTO WITH HYDRODISTENSION N/A 06/02/2017   Procedure: CYSTOSCOPY/HYDRODISTENSION OF BLADDER;  Surgeon: Watt Rush, MD;  Location: [facility];  Service: Urology;  Laterality: N/A;   CYSTO/ HYDRODISTENTION/  INSTILLATION THERAPY  1990s   DX LAPAROSCOPY/  DX HYSTEROSCOPY/  D & C  08-07-2007   dr dove  [facility]   ENDOMETRIAL ABLATION W/ NOVASURE  10-21-2008   dr herchel  [facility]   ESOPHAGEAL MANOMETRY N/A 09/22/2015   Procedure: ESOPHAGEAL MANOMETRY (EM);  Surgeon: Victory LITTIE Legrand DOUGLAS, MD;  Location: [facility];  Service: Gastroenterology;  Laterality: N/A;  with impedence    FINGER ARTHROPLASTY Bilateral    left little finger 2016:  right middle finger 06/ 2018   IMPLANTATION OF HYPOGLOSSAL NERVE STIMULATOR Right 03/09/2023   Procedure: RIGHT IMPLANTATION OF  HYPOGLOSSAL NERVE STIMULATOR;  Surgeon: Carlie Clark, MD;  Location: [facility];  Service: ENT;  Laterality: Right;   IMPLANTATION OF HYPOGLOSSAL NERVE STIMULATOR Bilateral 08/09/2023   Procedure: REVISION CAPSULOTOMY;  Surgeon: Carlie Clark, MD;  Location: [facility];  Service: ENT;  Laterality: Bilateral;   KNEE ARTHROSCOPY Bilateral x3 left ;  x3  right-- last one age 57 (36)   NASAL SEPTUM SURGERY  age 59s   ROBOT ASSISTED REDUCTION PARAESOPHAGEAL HIATAL HERNIA/ TYPE 2 MEDIASTINAL DISSECTION/ PRIMARY HIATAL HERNIA REPAIR/ ANTERIOR & POSTERIOR GASTROPEXY/ NISSEN FUNDOPLICATION  03-04-2017   DR GROSS  [facility]   SINOSCOPY     TEMPOROMANDIBULAR JOINT SURGERY Bilateral x3  -- age 89s   per pt used graft   TONSILLECTOMY     TOTAL KNEE ARTHROPLASTY Left 12/26/2017   Procedure: LEFT TOTAL KNEE ARTHROPLASTY;  Surgeon: Melodi Lerner, MD;  Location: [facility];  Service: Orthopedics;  Laterality: Left;    TRANSTHORACIC ECHOCARDIOGRAM  12-06-2017   dr jordan   ef 55-60%, grade 1 diastoilc dysfunction, trivial MR   TRIGGER FINGER RELEASE Bilateral last one 2017   several release's bilaterally   ULNAR NERVE TRANSPOSITION Bilateral age 65 (17)    Social History   Socioeconomic History   Marital status: Divorced    Spouse name: Not on file   Number of children: 0   Years of education: Not on file   Highest education level: Bachelor's degree (e.g., BA, AB, BS)  Occupational History   Occupation: disability  Tobacco Use   Smoking status: Never    Passive exposure: Never   Smokeless tobacco: Never  Vaping Use   Vaping status: Never Used  Substance and Sexual Activity   Alcohol use: Yes    Alcohol/week: 0.0 standard drinks of alcohol    Comment: once every 3-4 months   Drug use: No   Sexual activity: Not Currently    Partners: Male    Birth control/protection: Post-menopausal    Comment: ablation  Other Topics Concern   Not on file  Social History  Narrative   Lives alone with 2 cats.   Social Drivers of Health   Financial Resource Strain: Medium Risk (04/29/2023)   Overall Financial Resource Strain (CARDIA)    Difficulty of Paying Living Expenses: Somewhat hard  Food Insecurity: No Food Insecurity (04/29/2023)   Hunger Vital Sign    Worried About Running Out of Food in the Last Year: Never true    Ran Out of  Food in the Last Year: Never true  Recent Concern: Food Insecurity - Food Insecurity Present (03/22/2023)   Hunger Vital Sign    Worried About Running Out of Food in the Last Year: Sometimes true    Ran Out of Food in the Last Year: Sometimes true  Transportation Needs: No Transportation Needs (04/29/2023)   PRAPARE - Administrator, Civil Service (Medical): No    Lack of Transportation (Non-Medical): No  Physical Activity: Inactive (04/29/2023)   Exercise Vital Sign    Days of Exercise per Week: 0 days    Minutes of Exercise per Session: 0 min  Stress: Stress Concern Present (04/29/2023)   [employer] of Occupational Health - Occupational Stress Questionnaire    Feeling of Stress : Rather much  Social Connections: Moderately Integrated (04/29/2023)   Social Connection and Isolation Panel    Frequency of Communication with Friends and Family: More than three times a week    Frequency of Social Gatherings with Friends and Family: Once a week    Attends Religious Services: More than 4 times per year    Active Member of [employer] or Organizations: Yes    Attends Banker Meetings: 1 to 4 times per year    Marital Status: Divorced  Intimate Partner Violence: Patient Unable To Answer (04/29/2023)   Humiliation, Afraid, Rape, and Kick questionnaire    Fear of Current or Ex-Partner: Patient unable to answer    Emotionally Abused: Patient unable to answer    Physically Abused: Patient unable to answer    Sexually Abused: Patient unable to answer    Family History  Problem Relation Age of Onset    Hypertension Mother        deceased from MVA complications   Thyroid  disease Mother    Allergic rhinitis Mother    Testicular cancer Father    Allergic rhinitis Father    Colon cancer Sister    Colon polyps Sister    Healthy Sister    Healthy Brother    Coronary artery disease Other    Stomach cancer Neg Hx     Anti-infectives: Anti-infectives (From admission, onward)    Start     Dose/Rate Route Frequency Ordered Stop   12/20/23 0738  ceFAZolin  (ANCEF ) IVPB 2g/100 mL premix        2 g 200 mL/hr over 30 Minutes Intravenous 30 min pre-op 12/20/23 9261         Current Facility-Administered Medications  Medication Dose Route Frequency Provider Last Rate Last Admin   ceFAZolin  (ANCEF ) IVPB 2g/100 mL premix  2 g Intravenous 30 min Pre-Op Watt Rush, MD       chlorhexidine  (PERIDEX ) 0.12 % solution 15 mL  15 mL Mouth/Throat Once Woodrum, Chelsey L, MD       Or   Oral care mouth rinse  15 mL Mouth Rinse Once Woodrum, Marien CROME, MD       lactated ringers  infusion   Intravenous Continuous Niels Marien CROME, MD       Current Facility-Administered Medications for the 12/20/23 encounter [facility] Encounter)  Medication   denosumab  (PROLIA ) injection 60 mg   denosumab  (PROLIA ) injection 60 mg   Current Meds  Medication Sig   albuterol  (VENTOLIN  HFA) 108 (90 Base) MCG/ACT inhaler INHALE 1-2 PUFFS INTO THE LUNGS DAILY AS NEEDED FOR WHEEZING OR SHORTNESS OF BREATH.   amoxicillin -clavulanate (AUGMENTIN ) 875-125 MG tablet Take 1 tablet by mouth 2 (two) times daily for 7 days.   ascorbic  acid (VITAMIN C) 500 MG tablet Take 500 mg by mouth daily.   AUSTEDO  XR 6 MG TB24 TAKE 1 TABLET BY MOUTH 1 TIME A DAY   Azelastine  HCl 137 MCG/SPRAY SOLN Place 2 sprays into the nose 2 (two) times daily. (Patient taking differently: Place 2 sprays into the nose daily.)   budesonide -formoterol  (SYMBICORT ) 160-4.5 MCG/ACT inhaler Inhale 2 puffs into the lungs daily.   Carbinoxamine  Maleate 4 MG TABS Take 1  tablet (4 mg total) by mouth every 6 (six) hours.   Cholecalciferol  (VITAMIN D ) 50 MCG (2000 UT) CAPS Take 2,000 Units by mouth daily.   clonazePAM  (KLONOPIN ) 1 MG tablet Take 1 mg by mouth daily as needed for anxiety.   cyanocobalamin  (VITAMIN B12) 1000 MCG/ML injection Inject 1,000 mcg into the muscle every 30 (thirty) days.   dicyclomine  (BENTYL ) 10 MG capsule TAKE 1 CAPSULE BY MOUTH UP TO 3 TIMES DAILY BEFORE MEALS.   DULoxetine (CYMBALTA) 60 MG capsule Take 60 mg by mouth 2 (two) times daily at 10 AM and 5 PM.   fluticasone  (FLONASE ) 50 MCG/ACT nasal spray PLACE 1 SPRAY INTO BOTH NOSTRILS 2 (TWO) TIMES DAILY AS NEEDED FOR ALLERGIES OR RHINITIS.   JARDIANCE  10 MG TABS tablet TAKE 1 TABLET BY MOUTH EVERY DAY BEFORE BREAKFAST   l-methylfolate-B6-B12 (METANX) 3-35-2 MG TABS tablet Take 2 tablets by mouth at bedtime.   levothyroxine  (SYNTHROID ) 25 MCG tablet TAKE 1 TABLET BY MOUTH EVERY DAY   metoprolol  succinate (TOPROL -XL) 25 MG 24 hr tablet TAKE 1 TABLET (25 MG TOTAL) BY MOUTH DAILY.   montelukast  (SINGULAIR ) 10 MG tablet Take 1 tablet (10 mg total) by mouth at bedtime.   NURTEC 75 MG TBDP TAKE 1 TABLET BY MOUTH EVERY OTHER DAY   QUEtiapine (SEROQUEL) 25 MG tablet Take 25-50 mg by mouth at bedtime as needed (sleep).   rosuvastatin  (CRESTOR ) 20 MG tablet TAKE 1 TABLET BY MOUTH EVERY DAY   SODIUM FLUORIDE  5000 PPM 1.1 % PSTE 1 application  at bedtime.   spironolactone  (ALDACTONE ) 25 MG tablet TAKE 1 TABLET (25 MG TOTAL) BY MOUTH DAILY.   tiZANidine  (ZANAFLEX ) 2 MG tablet TAKE 1 TABLET (2MG  TOTAL) BY MOUTH TWICE A DAY AS NEEDED FOR MUSCLE SPASM   Vibegron (GEMTESA) 75 MG TABS Take 75 mg by mouth at bedtime.   VRAYLAR 4.5 MG CAPS Take 4.5 mg by mouth at bedtime.   zinc  gluconate 50 MG tablet Take 50 mg by mouth daily.   [DISCONTINUED] dicyclomine  (BENTYL ) 10 MG capsule TAKE 1 CAPSULE (10 MG TOTAL) BY MOUTH 3 (THREE) TIMES DAILY BEFORE MEALS.   [DISCONTINUED] metoprolol  succinate (TOPROL -XL) 25  MG 24 hr tablet TAKE 1 TABLET (25 MG TOTAL) BY MOUTH DAILY.   [DISCONTINUED] rosuvastatin  (CRESTOR ) 20 MG tablet TAKE 1 TABLET BY MOUTH EVERY DAY       Objective: Vital signs in last 24 hours: BP (!) 168/84   Pulse (!) 110   Temp 98.6 F (37 C) (Oral)   Resp 16   Ht 4' 11 (1.499 m)   Wt 59.4 kg   LMP 03/05/2012 (Approximate)   SpO2 95%   BMI 26.46 kg/m   Intake/Output from previous day: No intake/output data recorded. Intake/Output this shift: No intake/output data recorded.   Physical Exam Vitals reviewed.  Constitutional:      Appearance: Normal appearance.  Cardiovascular:     Rate and Rhythm: Normal rate and regular rhythm.  Pulmonary:     Effort: Pulmonary effort is normal. No respiratory  distress.  Neurological:     Mental Status: She is alert.     Lab Results:  Results for orders placed or performed during the hospital encounter of 12/20/23 (from the past 24 hours)  Glucose, capillary     Status: Abnormal   Collection Time: 12/20/23  8:10 AM  Result Value Ref Range   Glucose-Capillary 146 (H) 70 - 99 mg/dL   Comment 1 Notify RN     BMET No results for input(s): NA, K, CL, CO2, GLUCOSE, BUN, CREATININE, CALCIUM  in the last 72 hours. PT/INR No results for input(s): LABPROT, INR in the last 72 hours. ABG No results for input(s): PHART, HCO3 in the last 72 hours.  Invalid input(s): PCO2, PO2  Studies/Results: No results found.   Assessment/Plan: Interstitial cystitis with recurrent pain.   Cystoscopy with HOD today.  Risks reviewed in detail.            Flynn Lininger 12/20/2023

## 2023-12-21 ENCOUNTER — Encounter (HOSPITAL_COMMUNITY): Payer: Self-pay | Admitting: Urology

## 2023-12-22 ENCOUNTER — Ambulatory Visit: Admitting: Internal Medicine

## 2023-12-22 ENCOUNTER — Encounter: Payer: Self-pay | Admitting: Internal Medicine

## 2023-12-22 VITALS — BP 110/78 | HR 85 | Temp 97.7°F | Resp 16 | Ht 59.0 in | Wt 132.0 lb

## 2023-12-22 DIAGNOSIS — R251 Tremor, unspecified: Secondary | ICD-10-CM

## 2023-12-22 DIAGNOSIS — N1831 Chronic kidney disease, stage 3a: Secondary | ICD-10-CM | POA: Diagnosis not present

## 2023-12-22 DIAGNOSIS — S00459A Superficial foreign body of unspecified ear, initial encounter: Secondary | ICD-10-CM | POA: Diagnosis not present

## 2023-12-22 DIAGNOSIS — I959 Hypotension, unspecified: Secondary | ICD-10-CM | POA: Diagnosis not present

## 2023-12-22 DIAGNOSIS — Z87898 Personal history of other specified conditions: Secondary | ICD-10-CM | POA: Insufficient documentation

## 2023-12-22 DIAGNOSIS — E039 Hypothyroidism, unspecified: Secondary | ICD-10-CM

## 2023-12-22 DIAGNOSIS — B372 Candidiasis of skin and nail: Secondary | ICD-10-CM | POA: Insufficient documentation

## 2023-12-22 DIAGNOSIS — Q809 Congenital ichthyosis, unspecified: Secondary | ICD-10-CM | POA: Insufficient documentation

## 2023-12-22 DIAGNOSIS — E785 Hyperlipidemia, unspecified: Secondary | ICD-10-CM | POA: Diagnosis not present

## 2023-12-22 LAB — HEPATIC FUNCTION PANEL
ALT: 22 U/L (ref 0–35)
AST: 25 U/L (ref 0–37)
Albumin: 4.4 g/dL (ref 3.5–5.2)
Alkaline Phosphatase: 68 U/L (ref 39–117)
Bilirubin, Direct: 0.1 mg/dL (ref 0.0–0.3)
Total Bilirubin: 0.7 mg/dL (ref 0.2–1.2)
Total Protein: 7.4 g/dL (ref 6.0–8.3)

## 2023-12-22 LAB — TSH: TSH: 0.88 u[IU]/mL (ref 0.35–5.50)

## 2023-12-22 LAB — LIPID PANEL
Cholesterol: 143 mg/dL (ref 0–200)
HDL: 68.4 mg/dL (ref >39.00)
LDL Cholesterol: 53 mg/dL (ref 0–99)
NonHDL: 74.4
Total CHOL/HDL Ratio: 2
Triglycerides: 107 mg/dL (ref 0.0–149.0)
VLDL: 21.4 mg/dL (ref 0.0–40.0)

## 2023-12-22 LAB — CORTISOL: Cortisol, Plasma: 6.4 ug/dL

## 2023-12-22 NOTE — Patient Instructions (Signed)

## 2023-12-22 NOTE — Progress Notes (Signed)
 "  Subjective:  Patient ID: [name], female    DOB: [birth year]  Age: 68 y.o. MRN: [id number]  CC: Medical Management of Chronic Issues (6 month follow up )   HPI [name] presents for f/up ----  Discussed the use of AI scribe software for clinical note transcription with the patient, who gave verbal consent to proceed.  History of Present Illness [name] is a 68 year old female who presents for follow-up after a hydrodistention procedure.  She recently underwent a hydrodistention procedure earlier this week. The procedure was not painful due to anesthesia, but she has experienced mild post-procedural pain and discomfort localized to her bladder. She has only required one pain pill since the procedure. There is no hematuria. She has had this procedure before, approximately nine years ago, which was beneficial in reducing pain and allowing her to hold more urine without frequent urination.  She experiences ongoing tremors in her arm and feels that her current medication, Austedo , is not effective. She previously used trihexyphenidyl , which she found more beneficial, but it is not covered by her insurance.  She is currently taking rosuvastatin  for cholesterol management but cannot refill her prescription until January, raising concerns about missing doses in the interim.  She has a history of kidney issues, classified as Stage 3A chronic kidney disease, attributed to previous excessive use of ibuprofen and naproxen  sodium for migraines and arthritis pain. She no longer takes these medications and is currently on Jardiance .  She has foreign bodies in her earlobes due to ingrown earring backs, discovered when her earrings were removed recently.  She has not had a bowel movement since her surgery, with the last one occurring on the previous Sunday evening. She notes experiencing rapid breathing at night, which she can calm down. Her blood pressure was high on the day of her  surgery, attributed to anxiety.     Outpatient Medications Prior to Visit  Medication Sig Dispense Refill   albuterol  (VENTOLIN  HFA) 108 (90 Base) MCG/ACT inhaler INHALE 1-2 PUFFS INTO THE LUNGS DAILY AS NEEDED FOR WHEEZING OR SHORTNESS OF BREATH. 18 each 1   ascorbic acid (VITAMIN C) 500 MG tablet Take 500 mg by mouth daily.     AUSTEDO  XR 6 MG TB24 TAKE 1 TABLET BY MOUTH 1 TIME A DAY 30 tablet 0   Azelastine  HCl 137 MCG/SPRAY SOLN Place 2 sprays into the nose 2 (two) times daily. (Patient taking differently: Place 2 sprays into the nose daily.) 90 mL 3   budesonide -formoterol  (SYMBICORT ) 160-4.5 MCG/ACT inhaler Inhale 2 puffs into the lungs daily.     Carbinoxamine  Maleate 4 MG TABS Take 1 tablet (4 mg total) by mouth every 6 (six) hours. 360 tablet 1   Cholecalciferol  (VITAMIN D ) 50 MCG (2000 UT) CAPS Take 2,000 Units by mouth daily.     clonazePAM  (KLONOPIN ) 1 MG tablet Take 1 mg by mouth daily as needed for anxiety.     cyanocobalamin  (VITAMIN B12) 1000 MCG/ML injection Inject 1,000 mcg into the muscle every 30 (thirty) days.     [START ON 01/07/2024] denosumab  (PROLIA ) 60 MG/ML SOSY injection INJECT 1 SYRINGE UNDER THE SKIN ONCE EVERY 6 MONTHS 1 mL 0   dicyclomine  (BENTYL ) 10 MG capsule TAKE 1 CAPSULE BY MOUTH UP TO 3 TIMES DAILY BEFORE MEALS. 270 capsule 1   DULoxetine (CYMBALTA) 60 MG capsule Take 60 mg by mouth 2 (two) times daily at 10 AM and 5 PM.  fluticasone  (FLONASE ) 50 MCG/ACT nasal spray PLACE 1 SPRAY INTO BOTH NOSTRILS 2 (TWO) TIMES DAILY AS NEEDED FOR ALLERGIES OR RHINITIS. 48 mL 1   HYDROcodone -acetaminophen  (NORCO/VICODIN) 5-325 MG tablet Take 1 tablet by mouth every 6 (six) hours as needed for severe pain (pain score 7-10). 6 tablet 0   JARDIANCE  10 MG TABS tablet TAKE 1 TABLET BY MOUTH EVERY DAY BEFORE BREAKFAST 90 tablet 1   l-methylfolate-B6-B12 (METANX) 3-35-2 MG TABS tablet Take 2 tablets by mouth at bedtime.     levothyroxine  (SYNTHROID ) 25 MCG  tablet TAKE 1 TABLET BY MOUTH EVERY DAY 90 tablet 0   metoprolol  succinate (TOPROL -XL) 25 MG 24 hr tablet TAKE 1 TABLET (25 MG TOTAL) BY MOUTH DAILY. 90 tablet 0   montelukast  (SINGULAIR ) 10 MG tablet Take 1 tablet (10 mg total) by mouth at bedtime. 90 tablet 3   NURTEC 75 MG TBDP TAKE 1 TABLET BY MOUTH EVERY OTHER DAY 40 tablet 2   promethazine  (PHENERGAN ) 12.5 MG tablet Take 1 tablet (12.5 mg total) by mouth every 6 (six) hours as needed for nausea or vomiting. 30 tablet 0   QUEtiapine (SEROQUEL) 25 MG tablet Take 25-50 mg by mouth at bedtime as needed (sleep).     rosuvastatin  (CRESTOR ) 20 MG tablet TAKE 1 TABLET BY MOUTH EVERY DAY 90 tablet 0   SODIUM FLUORIDE  5000 PPM 1.1 % PSTE 1 application  at bedtime.     spironolactone  (ALDACTONE ) 25 MG tablet TAKE 1 TABLET (25 MG TOTAL) BY MOUTH DAILY. 90 tablet 0   tiZANidine  (ZANAFLEX ) 2 MG tablet TAKE 1 TABLET (2MG  TOTAL) BY MOUTH TWICE A DAY AS NEEDED FOR MUSCLE SPASM 180 tablet 1   Vibegron (GEMTESA) 75 MG TABS Take 75 mg by mouth at bedtime.     VRAYLAR 4.5 MG CAPS Take 4.5 mg by mouth at bedtime.     zinc  gluconate 50 MG tablet Take 50 mg by mouth daily.     denosumab  (PROLIA ) injection 60 mg      denosumab  (PROLIA ) injection 60 mg      No facility-administered medications prior to visit.    ROS Review of Systems  Constitutional:  Negative for appetite change, chills, diaphoresis, fatigue and fever.  HENT: Negative.  Negative for trouble swallowing.   Eyes: Negative.   Respiratory:  Negative for cough, chest tightness, shortness of breath and wheezing.   Cardiovascular:  Negative for chest pain, palpitations and leg swelling.  Gastrointestinal: Negative.  Negative for abdominal pain, constipation, diarrhea, nausea and vomiting.  Endocrine: Negative.   Genitourinary:  Positive for pelvic pain. Negative for difficulty urinating, dysuria, hematuria and urgency.  Musculoskeletal:  Negative for arthralgias.  Skin: Negative.    Neurological:  Positive for tremors. Negative for dizziness, syncope, speech difficulty, weakness and light-headedness.  Hematological:  Negative for adenopathy. Does not bruise/bleed easily.  Psychiatric/Behavioral: Negative.      Objective:  BP 110/78 (BP Location: Left Arm, Patient Position: Sitting, Cuff Size: Small)   Pulse 85   Temp 97.7 F (36.5 C) (Oral)   Resp 16   Ht 4' 11 (1.499 m)   Wt 132 lb (59.9 kg)   LMP 03/05/2012 (Approximate)   SpO2 96%   BMI 26.66 kg/m   BP Readings from Last 3 Encounters:  12/22/23 110/78  12/20/23 116/76  12/13/23 120/89    Wt Readings from Last 3 Encounters:  12/22/23 132 lb (59.9 kg)  12/20/23 131 lb (59.4 kg)  12/13/23 128 lb 6.4 oz (58.2 kg)  Physical Exam Vitals reviewed.  Constitutional:      Appearance: Normal appearance.  HENT:     Head:     Comments: Earring studs are embedded in both ear lobes    Nose: Nose normal.     Mouth/Throat:     Mouth: Mucous membranes are moist.  Eyes:     General: No scleral icterus.    Conjunctiva/sclera: Conjunctivae normal.  Cardiovascular:     Rate and Rhythm: Normal rate and regular rhythm.     Heart sounds: No murmur heard.    No friction rub. No gallop.     Comments: EKG--- NSR, 82 bpm No LVH, Q waves, or ST/T wave changes  Pulmonary:     Effort: Pulmonary effort is normal.     Breath sounds: No stridor. No wheezing, rhonchi or rales.  Abdominal:     General: Abdomen is flat. Bowel sounds are normal. There is no distension.     Palpations: Abdomen is soft. There is no hepatomegaly, splenomegaly or mass.     Tenderness: There is no abdominal tenderness. There is no guarding or rebound.     Hernia: No hernia is present.  Musculoskeletal:     Cervical back: Neck supple.     Right lower leg: No edema.     Left lower leg: No edema.  Skin:    General: Skin is warm and dry.     Findings: No rash.  Neurological:     General: No focal deficit present.     Mental Status:  She is alert.  Psychiatric:        Mood and Affect: Mood normal.        Behavior: Behavior normal.     Lab Results  Component Value Date   WBC 9.6 12/13/2023   HGB 13.9 12/13/2023   HCT 43.6 12/13/2023   PLT 191 12/13/2023   GLUCOSE 111 (H) 12/13/2023   CHOL 143 12/22/2023   TRIG 107.0 12/22/2023   HDL 68.40 12/22/2023   LDLDIRECT 143.0 09/14/2016   LDLCALC 53 12/22/2023   ALT 22 12/22/2023   AST 25 12/22/2023   NA 141 12/13/2023   K 4.1 12/13/2023   CL 104 12/13/2023   CREATININE 0.93 12/13/2023   BUN 18 12/13/2023   CO2 24 12/13/2023   TSH 0.88 12/22/2023   INR 0.94 12/21/2017   HGBA1C 5.7 (H) 12/13/2023   MICROALBUR <0.7 06/21/2023    No results found.  Assessment & Plan:  Hyperlipidemia LDL goal <100- LDL goal achieved. Doing well on the statin  -     AMB Referral [facility] Care Management -     Lipid panel; Future -     Hepatic function panel; Future  Hypotension, unspecified hypotension type- EKG is normal.. -     EKG 12-Lead -     Cortisol; Future  Acquired hypothyroidism- She is euthyroid. -     TSH; Future  Foreign body in ear lobe, unspecified laterality, initial encounter -     Ambulatory referral to ENT  Tremor of both hands -     Ambulatory referral to Neurology     Follow-up: Return in about 3 months (around 03/23/2024).  Debby Molt, MD "

## 2023-12-23 ENCOUNTER — Ambulatory Visit: Payer: Self-pay | Admitting: Internal Medicine

## 2023-12-26 ENCOUNTER — Telehealth: Payer: Self-pay | Admitting: *Deleted

## 2023-12-26 NOTE — Progress Notes (Unsigned)
 Care Guide Pharmacy Note  12/26/2023 Name: [name] MRN: [id number] DOB: 10-Jun-1955  Referred By: Joshua Debby CROME, MD Reason for referral: Call Attempt #1 and Complex Care Management (Outreach to schedule referral with pharmacist )   [name] is a 68 y.o. year old female who is a primary care patient of Joshua Debby CROME, MD.  [name] was referred to the pharmacist for assistance related to: HLD  An unsuccessful telephone outreach was attempted today to contact the patient who was referred to the pharmacy team for assistance with medication management. Additional attempts will be made to contact the patient.  Thedford Franks, CMA [place]  [facility], [facility] Guide Direct Dial: [phone]  Fax: [phone] Website: [email or web address]

## 2023-12-27 DIAGNOSIS — N3010 Interstitial cystitis (chronic) without hematuria: Secondary | ICD-10-CM | POA: Diagnosis not present

## 2023-12-27 NOTE — Progress Notes (Unsigned)
 Care Guide Pharmacy Note  12/27/2023 Name: [name] MRN: [id number] DOB: 1955/11/13  Referred By: Joshua Debby CROME, MD Reason for referral: Call Attempt #1 and Complex Care Management (Outreach to schedule referral with pharmacist )   [name] is a 68 y.o. year old female who is a primary care patient of Joshua Debby CROME, MD.  [name] was referred to the pharmacist for assistance related to: HLD  A second unsuccessful telephone outreach was attempted today to contact the patient who was referred to the pharmacy team for assistance with medication management. Additional attempts will be made to contact the patient.  Thedford Franks, CMA [place]  [facility], [facility] Guide Direct Dial: [phone]  Fax: [phone] Website: [email or web address]

## 2023-12-28 ENCOUNTER — Telehealth: Payer: Self-pay

## 2023-12-28 NOTE — Telephone Encounter (Signed)
 Copied from CRM [id number]. Topic: General - Other >> Dec 28, 2023 12:02 PM Mesmerise C wrote: Reason for CRM: Patient states Dr. Joshua wanted to know who was her Neurologist in the past and it was Asberry Jacob Tat OD [phone] stated she would like to stay with Dr. Evonnie for [name]

## 2023-12-28 NOTE — Progress Notes (Signed)
 Care Guide Pharmacy Note  12/28/2023 Name: [name] MRN: [id number] DOB: 1955-07-03  Referred By: Joshua Debby CROME, MD Reason for referral: Call Attempt #1 and Complex Care Management (Outreach to schedule referral with pharmacist )   [name] is a 68 y.o. year old female who is a primary care patient of Joshua Debby CROME, MD.  [name] was referred to the pharmacist for assistance related to: HLD  A third unsuccessful telephone outreach was attempted today to contact the patient who was referred to the pharmacy team for assistance with medication management. The Population Health team is pleased to engage with this patient at any time in the future upon receipt of referral and should he/she be interested in assistance from the Population Health team.  [name], CMA [facility]  [facility], [facility] Guide Direct Dial: [phone]  Fax: [phone] Website: [email or web address]

## 2023-12-30 DIAGNOSIS — F3132 Bipolar disorder, current episode depressed, moderate: Secondary | ICD-10-CM | POA: Diagnosis not present

## 2023-12-30 NOTE — Telephone Encounter (Signed)
 Patient was referred to Dr. Martie office I advised the patient when they called her to advised them she wants to continue see Dr. Evonnie she gave a verbal understanding

## 2024-01-02 ENCOUNTER — Ambulatory Visit (INDEPENDENT_AMBULATORY_CARE_PROVIDER_SITE_OTHER): Admitting: Physician Assistant

## 2024-01-02 ENCOUNTER — Encounter (INDEPENDENT_AMBULATORY_CARE_PROVIDER_SITE_OTHER): Payer: Self-pay | Admitting: Physician Assistant

## 2024-01-02 VITALS — BP 119/78 | HR 100 | Temp 97.5°F | Ht 59.0 in | Wt 127.0 lb

## 2024-01-02 DIAGNOSIS — H6122 Impacted cerumen, left ear: Secondary | ICD-10-CM

## 2024-01-02 DIAGNOSIS — S00451A Superficial foreign body of right ear, initial encounter: Secondary | ICD-10-CM

## 2024-01-02 DIAGNOSIS — S00452A Superficial foreign body of left ear, initial encounter: Secondary | ICD-10-CM

## 2024-01-03 NOTE — Progress Notes (Signed)
 Dear Dr. Joshua, Here is my assessment for our mutual patient, [name]. Thank you for allowing me the opportunity to care for your patient. Please do not hesitate to contact me should you have any other questions. Sincerely, Chyrl Cohen PA-C  Otolaryngology Clinic Note Referring provider: Dr. Joshua HPI:  [name] is a 68 y.o. female kindly referred by Dr. Joshua   Discussed the use of AI scribe software for clinical note transcription with the patient, who gave verbal consent to proceed.  History of Present Illness   [name] is a 68 year old female who presents with retained earring backings post-surgery.  She experienced difficulty removing her earrings prior to a recent surgery, during which the earring studs were removed but the backings were left behind. She managed to remove one backing post-surgery in the left ear, but the other remained lodged in her right earlobe. No pain is associated with the retained earring backing.  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  She uses a nasal spray and oral medication for allergies, which effectively manage her symptoms.           Independent Review of Additional Tests or Records:  none   PMH/Meds/All/SocHx/FamHx/ROS:   Past Medical History:  Diagnosis Date   Allergic rhinitis    Anemia    Anxiety    Benign essential tremor    hands   Bipolar 1 disorder, mixed, moderate (HCC)    Claustrophobia    Depression    Diabetes (HCC)    Eczema    Frequency of urination    GERD (gastroesophageal reflux disease)    History of frequent urinary tract infections    History of gastroesophageal reflux (GERD)    05-25-2017  per pt no issues since hiatal hernia repair 01/ 2018   History of hiatal hernia    History of melanoma excision    early 2000s-- BACK   History of panic attacks    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   Hypothyroidism    Intermittent palpitations    cardiology--  dr jordan   Interstitial cystitis    Limited jaw range of motion    s/p  bilateral TMJ surgery,  age 32s   Migraines    Moderate persistent asthma    pulmologist-- dr shellia   OA (osteoarthritis)    both knees   OSA on    per last sleep study 09/ 2017  mild OSA, AHI13/hr   PONV (postoperative nausea and vomiting)    PVC (premature ventricular contraction)    Rosacea    Sensation of pressure in bladder area    per pt intermittant   Squamous cell carcinoma, ear    TMJ arthralgia    Urticaria    Wears glasses    White coat syndrome without hypertension    05-25-2017  per pt hx hypertension yrs ago, no issues after quitting stressful job     Past Surgical History:  Procedure Laterality Date   24 HOUR PH STUDY N/A 09/22/2015   Procedure: 24 HOUR PH STUDY;  Surgeon: Victory LITTIE Legrand DOUGLAS, MD;  Location: [facility];  Service: Gastroenterology;  Laterality: N/A;   ADENOIDECTOMY     ANAL FISSURE REPAIR  age 40  (20)   BREAST EXCISIONAL BIOPSY Right 04-16-2003  dr p. young  [facility]   benign   BUNIONECTOMY Right early 2000s   CARPAL TUNNEL  RELEASE Left age 64 (95)   CHOLECYSTECTOMY OPEN  age 43   CYSTO WITH HYDRODISTENSION N/A 06/02/2017   Procedure: CYSTOSCOPY/HYDRODISTENSION OF BLADDER;  Surgeon: Watt Rush, MD;  Location: [facility];  Service: Urology;  Laterality: N/A;   CYSTO WITH HYDRODISTENSION N/A 12/20/2023   Procedure: CYSTOSCOPY, WITH BLADDER HYDRODISTENSION;  Surgeon: Watt Rush, MD;  Location: [facility];  Service: Urology;  Laterality: N/A;   CYSTO/ HYDRODISTENTION/  INSTILLATION THERAPY  1990s   DX LAPAROSCOPY/  DX HYSTEROSCOPY/  D & C  08-07-2007   dr dove  [facility]   ENDOMETRIAL ABLATION W/ NOVASURE  10-21-2008   dr herchel  [facility]   ESOPHAGEAL MANOMETRY N/A 09/22/2015   Procedure: ESOPHAGEAL MANOMETRY (EM);  Surgeon: Victory LITTIE Legrand DOUGLAS, MD;  Location: [facility];  Service:  Gastroenterology;  Laterality: N/A;  with impedence    FINGER ARTHROPLASTY Bilateral    left little finger 2016:  right middle finger 06/ 2018   IMPLANTATION OF HYPOGLOSSAL NERVE STIMULATOR Right 03/09/2023   Procedure: RIGHT IMPLANTATION OF HYPOGLOSSAL NERVE STIMULATOR;  Surgeon: Carlie Clark, MD;  Location: [facility];  Service: ENT;  Laterality: Right;   IMPLANTATION OF HYPOGLOSSAL NERVE STIMULATOR Bilateral 08/09/2023   Procedure: REVISION CAPSULOTOMY;  Surgeon: Carlie Clark, MD;  Location: [facility];  Service: ENT;  Laterality: Bilateral;   KNEE ARTHROSCOPY Bilateral x3 left ;  x3  right-- last one age 9 (73)   NASAL SEPTUM SURGERY  age 45s   ROBOT ASSISTED REDUCTION PARAESOPHAGEAL HIATAL HERNIA/ TYPE 2 MEDIASTINAL DISSECTION/ PRIMARY HIATAL HERNIA REPAIR/ ANTERIOR & POSTERIOR GASTROPEXY/ NISSEN FUNDOPLICATION  03-04-2017   DR GROSS  [facility]   SINOSCOPY     TEMPOROMANDIBULAR JOINT SURGERY Bilateral x3  -- age 8s   per pt used graft   TONSILLECTOMY     TOTAL KNEE ARTHROPLASTY Left 12/26/2017   Procedure: LEFT TOTAL KNEE ARTHROPLASTY;  Surgeon: Melodi Lerner, MD;  Location: [facility];  Service: Orthopedics;  Laterality: Left;    TRANSTHORACIC ECHOCARDIOGRAM  12-06-2017   dr jordan   ef 55-60%, grade 1 diastoilc dysfunction, trivial MR   TRIGGER FINGER RELEASE Bilateral last one 2017   several release's bilaterally   ULNAR NERVE TRANSPOSITION Bilateral age 86 (81)    Family History  Problem Relation Age of Onset   Hypertension Mother        deceased from MVA complications   Thyroid  disease Mother    Allergic rhinitis Mother    Testicular cancer Father    Allergic rhinitis Father    Colon cancer Sister    Colon polyps Sister    Healthy Sister    Healthy Brother    Coronary artery disease Other    Stomach cancer Neg Hx      Social Connections: Moderately Integrated (12/21/2023)   Social Connection and Isolation Panel    Frequency of  Communication with Friends and Family: More than three times a week    Frequency of Social Gatherings with Friends and Family: Once a week    Attends Religious Services: More than 4 times per year    Active Member of [employer] or Organizations: Yes    Attends Engineer, Structural: More than 4 times per year    Marital Status: Divorced      Current Outpatient Medications:    albuterol  (VENTOLIN  HFA) 108 (90 Base) MCG/ACT inhaler, INHALE 1-2 PUFFS INTO THE LUNGS DAILY AS NEEDED FOR WHEEZING OR SHORTNESS OF BREATH., Disp: 18 each, Rfl:  1   ascorbic acid (VITAMIN C) 500 MG tablet, Take 500 mg by mouth daily., Disp: , Rfl:    AUSTEDO  XR 6 MG TB24, TAKE 1 TABLET BY MOUTH 1 TIME A DAY, Disp: 30 tablet, Rfl: 0   Azelastine  HCl 137 MCG/SPRAY SOLN, Place 2 sprays into the nose 2 (two) times daily. (Patient taking differently: Place 2 sprays into the nose daily.), Disp: 90 mL, Rfl: 3   budesonide -formoterol  (SYMBICORT ) 160-4.5 MCG/ACT inhaler, Inhale 2 puffs into the lungs daily., Disp: , Rfl:    Carbinoxamine  Maleate 4 MG TABS, Take 1 tablet (4 mg total) by mouth every 6 (six) hours., Disp: 360 tablet, Rfl: 1   Cholecalciferol  (VITAMIN D ) 50 MCG (2000 UT) CAPS, Take 2,000 Units by mouth daily., Disp: , Rfl:    clonazePAM  (KLONOPIN ) 1 MG tablet, Take 1 mg by mouth daily as needed for anxiety., Disp: , Rfl:    cyanocobalamin  (VITAMIN B12) 1000 MCG/ML injection, Inject 1,000 mcg into the muscle every 30 (thirty) days., Disp: , Rfl:    dicyclomine  (BENTYL ) 10 MG capsule, TAKE 1 CAPSULE BY MOUTH UP TO 3 TIMES DAILY BEFORE MEALS., Disp: 270 capsule, Rfl: 1   DULoxetine (CYMBALTA) 60 MG capsule, Take 60 mg by mouth 2 (two) times daily at 10 AM and 5 PM., Disp: , Rfl:    fluticasone  (FLONASE ) 50 MCG/ACT nasal spray, PLACE 1 SPRAY INTO BOTH NOSTRILS 2 (TWO) TIMES DAILY AS NEEDED FOR ALLERGIES OR RHINITIS., Disp: 48 mL, Rfl: 1   HYDROcodone -acetaminophen  (NORCO/VICODIN) 5-325 MG tablet, Take 1 tablet by mouth  every 6 (six) hours as needed for severe pain (pain score 7-10)., Disp: 6 tablet, Rfl: 0   JARDIANCE  10 MG TABS tablet, TAKE 1 TABLET BY MOUTH EVERY DAY BEFORE BREAKFAST, Disp: 90 tablet, Rfl: 1   l-methylfolate-B6-B12 (METANX) 3-35-2 MG TABS tablet, Take 2 tablets by mouth at bedtime., Disp: , Rfl:    levothyroxine  (SYNTHROID ) 25 MCG tablet, TAKE 1 TABLET BY MOUTH EVERY DAY, Disp: 90 tablet, Rfl: 0   metoprolol  succinate (TOPROL -XL) 25 MG 24 hr tablet, TAKE 1 TABLET (25 MG TOTAL) BY MOUTH DAILY., Disp: 90 tablet, Rfl: 0   montelukast  (SINGULAIR ) 10 MG tablet, Take 1 tablet (10 mg total) by mouth at bedtime., Disp: 90 tablet, Rfl: 3   NURTEC 75 MG TBDP, TAKE 1 TABLET BY MOUTH EVERY OTHER DAY, Disp: 40 tablet, Rfl: 2   promethazine  (PHENERGAN ) 12.5 MG tablet, Take 1 tablet (12.5 mg total) by mouth every 6 (six) hours as needed for nausea or vomiting., Disp: 30 tablet, Rfl: 0   QUEtiapine (SEROQUEL) 25 MG tablet, Take 25-50 mg by mouth at bedtime as needed (sleep)., Disp: , Rfl:    rosuvastatin  (CRESTOR ) 20 MG tablet, TAKE 1 TABLET BY MOUTH EVERY DAY, Disp: 90 tablet, Rfl: 0   SODIUM FLUORIDE  5000 PPM 1.1 % PSTE, 1 application  at bedtime., Disp: , Rfl:    spironolactone  (ALDACTONE ) 25 MG tablet, TAKE 1 TABLET (25 MG TOTAL) BY MOUTH DAILY., Disp: 90 tablet, Rfl: 0   tiZANidine  (ZANAFLEX ) 2 MG tablet, TAKE 1 TABLET (2MG  TOTAL) BY MOUTH TWICE A DAY AS NEEDED FOR MUSCLE SPASM, Disp: 180 tablet, Rfl: 1   Vibegron (GEMTESA) 75 MG TABS, Take 75 mg by mouth at bedtime., Disp: , Rfl:    VRAYLAR 4.5 MG CAPS, Take 4.5 mg by mouth at bedtime., Disp: , Rfl:    zinc  gluconate 50 MG tablet, Take 50 mg by mouth daily., Disp: , Rfl:  Physical Exam:   BP 119/78   Pulse 100   Temp (!) 97.5 F (36.4 C)   Ht 4' 11 (1.499 m)   Wt 127 lb (57.6 kg)   LMP 03/05/2012 (Approximate)   SpO2 95%   BMI 25.65 kg/m   Pertinent Findings  CN II-XII grossly intact Left EAC with cerumen impaction ,right EAC clear and TM  intact with well pneumatized middle ear spaces, small earring back embedded in the right earlobe no surrounding redness swelling or discharge Anterior rhinoscopy: Septum midline; bilateral inferior turbinates with mild hypertrophy No lesions of oral cavity/oropharynx; dentition wnl No obviously palpable neck masses/lymphadenopathy/thyromegaly No respiratory distress or stridor    Seprately Identifiable Procedures:  Procedure: bilateral ear microscopy and cerumen removal using microscope (CPT 30789) - Mod 25 Pre-procedure diagnosis: unilateral cerumen impaction left external auditory canal Post-procedure diagnosis: same Indication: bilateral cerumen impaction; given patient's otologic complaints and history as well as for improved and comprehensive examination of external ear and tympanic membrane, bilateral otologic examination using microscope was performed and impacted cerumen removed  Procedure: Patient was placed semi-recumbent. Both ear canals were examined using the microscope with findings above. Cerumen removed from the left external auditory canal using suction and currette with improvement in EAC examination and patency. Left: EAC was patent. TM was intact . Middle ear was aerated. Drainage: none Right: EAC was patent. TM was intact . Middle ear was aerated . Drainage: none Patient tolerated the procedure well.   Impression & Plans:  Delayza Lungren is a 68 y.o. female with the following   Assessment and Plan    Retained earring backing in right earlobe- -I applied very gentle pressure to the earlobe and the backing fell out without difficulty.  No signs of trauma, no infection.  Follow-up PRN  Cerumen impaction, left ear - Cleaned cerumen from the left ear.           - f/u prn   Thank you for allowing me the opportunity to care for your patient. Please do not hesitate to contact me should you have any other questions.  Sincerely, Chyrl Cohen PA-C [facility] Phone: [phone] Fax: [phone]  01/03/2024, 3:37 PM

## 2024-01-08 ENCOUNTER — Other Ambulatory Visit: Payer: Self-pay | Admitting: Allergy & Immunology

## 2024-01-10 ENCOUNTER — Ambulatory Visit: Payer: Self-pay

## 2024-01-10 NOTE — Telephone Encounter (Signed)
 FYI Only or Action Required?: Action required by provider: referral request. Needs sooner appt to neurology. In addition pt wants to go back to Artane  as this medication was effective for her. PT is requesting that pcp contact insurance company to cover this medication.  Patient was last seen in primary care on 12/22/2023 by Joshua Debby CROME, MD.  Called Nurse Triage reporting Medication Reaction.  Symptoms began oongoing. .  Interventions attempted: Prescription medications: Austedo .  Symptoms are: gradually worsening.  Triage Disposition: see provider within 3 days. Patient/caregiver understands and will follow disposition?: yes                     Copied from CRM [id number]. Topic: Clinical - Red Word Triage >> Jan 10, 2024  2:19 PM [name] wrote: Red Word that prompted transfer to Nurse Triage: Pt was prescribed AUSTEDO  XR 6 MG TB24 (change in medication due to insurance) and it is not working. Pt has been waiting to hear from the neurologist and took it upon herself to call and set up appointment. Pt's app is not until Feb 4,2026. Pt cannot wait that long. Pt is shaking due to medication, not feeling like her usual self. Transferred to NT Reason for Disposition  [1] Pharmacy calling with prescription question AND [2] triager unable to answer question  Answer Assessment - Initial Assessment Questions Pt sates that Austedo  is not working. Pt was taken off of Artane  as insurance would not cover it. Pt would like pcp to request insurance to pay for Aratne as that was working for pt.  In addition pt would like to have pcp help her to gte into neurologist sooner. Pt is scheduled to be seen in February.    1. NAME of MEDICINE: What medicine(s) are you calling about?     Austedo  and Artane  2. QUESTION: What is your question? (e.g., double dose of medicine, side effect)     PT states that Austedo  is not working and she is having shaking. Pt wants to go back on  Artane . 3. PRESCRIBER: Who prescribed the medicine? Reason: if prescribed by specialist, call should be referred to that group.     Dr. Joshua 4. SYMPTOMS: Do you have any symptoms? If Yes, ask: What symptoms are you having?  How bad are the symptoms (e.g., mild, moderate, severe)     shaking  Protocols used: Medication Question Call-A-AH

## 2024-01-11 ENCOUNTER — Ambulatory Visit: Admitting: Internal Medicine

## 2024-01-11 ENCOUNTER — Encounter: Payer: Self-pay | Admitting: Internal Medicine

## 2024-01-11 ENCOUNTER — Other Ambulatory Visit: Payer: Self-pay | Admitting: Internal Medicine

## 2024-01-11 VITALS — BP 118/82 | HR 80 | Temp 98.3°F | Resp 16 | Ht 59.0 in | Wt 128.0 lb

## 2024-01-11 DIAGNOSIS — T50905A Adverse effect of unspecified drugs, medicaments and biological substances, initial encounter: Secondary | ICD-10-CM | POA: Diagnosis not present

## 2024-01-11 DIAGNOSIS — G43109 Migraine with aura, not intractable, without status migrainosus: Secondary | ICD-10-CM

## 2024-01-11 DIAGNOSIS — E785 Hyperlipidemia, unspecified: Secondary | ICD-10-CM | POA: Diagnosis not present

## 2024-01-11 DIAGNOSIS — G2589 Other specified extrapyramidal and movement disorders: Secondary | ICD-10-CM | POA: Diagnosis not present

## 2024-01-11 MED ORDER — TRIHEXYPHENIDYL HCL 2 MG PO TABS: 2.0000 mg | ORAL_TABLET | Freq: Three times a day (TID) | ORAL | 0 refills | Status: DC

## 2024-01-11 MED ORDER — ROSUVASTATIN CALCIUM 20 MG PO TABS: 20.0000 mg | ORAL_TABLET | Freq: Every day | ORAL | 0 refills | Status: AC

## 2024-01-11 MED ORDER — PROMETHAZINE HCL 12.5 MG PO TABS: 12.5000 mg | ORAL_TABLET | Freq: Four times a day (QID) | ORAL | 1 refills | Status: AC | PRN

## 2024-01-11 NOTE — Progress Notes (Signed)
 Subjective:  Patient ID: [name], female    DOB: 27-Sep-1955  Age: 68 y.o. MRN: [id number]  CC: Hypertension and Hyperlipidemia   HPI [name] presents for f/up --  Discussed the use of AI scribe software for clinical note transcription with the patient, who gave verbal consent to proceed.  History of Present Illness [name] is a 68 year old female who presents with worsening tremors and sleep disturbances.  She experiences worsening tremors that are not adequately managed by her current medication, Austedo . The tremors are affecting her handwriting and causing involuntary movements during sleep, particularly when she lies on one side and holds her arm out.  She has not yet seen a neurologist due to the earliest available appointment being on February 15th. She is on a call list for any cancellations that may arise.  She is currently taking Vraylar, which she finds helpful, but is not taking Seroquel. She is uncertain about her prescription for rosuvastatin , as it stopped being refillable despite having no noticeable side effects when she was taking it previously.  [place] and feels safe there. She has two cats, which she finds to be a source of comfort and support.     Outpatient Medications Prior to Visit  Medication Sig Dispense Refill   albuterol  (VENTOLIN  HFA) 108 (90 Base) MCG/ACT inhaler INHALE 1-2 PUFFS INTO THE LUNGS DAILY AS NEEDED FOR WHEEZING OR SHORTNESS OF BREATH. 18 each 1   ascorbic acid (VITAMIN C) 500 MG tablet Take 500 mg by mouth daily.     AUSTEDO  XR 6 MG TB24 TAKE 1 TABLET BY MOUTH 1 TIME A DAY 30 tablet 0   Azelastine  HCl 137 MCG/SPRAY SOLN Place 2 sprays into the nose 2 (two) times daily. (Patient taking differently: Place 2 sprays into the nose daily.) 90 mL 3   budesonide -formoterol  (SYMBICORT ) 160-4.5 MCG/ACT inhaler Inhale 2 puffs into the lungs daily.     Carbinoxamine  Maleate 4 MG TABS Take  1 tablet (4 mg total) by mouth every 6 (six) hours. 360 tablet 1   Cholecalciferol  (VITAMIN D ) 50 MCG (2000 UT) CAPS Take 2,000 Units by mouth daily.     clonazePAM  (KLONOPIN ) 1 MG tablet Take 1 mg by mouth daily as needed for anxiety.     cyanocobalamin  (VITAMIN B12) 1000 MCG/ML injection Inject 1,000 mcg into the muscle every 30 (thirty) days.     dicyclomine  (BENTYL ) 10 MG capsule TAKE 1 CAPSULE BY MOUTH UP TO 3 TIMES DAILY BEFORE MEALS. 270 capsule 1   DULoxetine (CYMBALTA) 60 MG capsule Take 60 mg by mouth 2 (two) times daily at 10 AM and 5 PM.     fluticasone  (FLONASE ) 50 MCG/ACT nasal spray PLACE 1 SPRAY INTO BOTH NOSTRILS 2 (TWO) TIMES DAILY AS NEEDED FOR ALLERGIES OR RHINITIS. 48 mL 1   JARDIANCE  10 MG TABS tablet TAKE 1 TABLET BY MOUTH EVERY DAY BEFORE BREAKFAST 90 tablet 1   l-methylfolate-B6-B12 (METANX) 3-35-2 MG TABS tablet Take 2 tablets by mouth at bedtime.     levothyroxine  (SYNTHROID ) 25 MCG tablet TAKE 1 TABLET BY MOUTH EVERY DAY 90 tablet 0   metoprolol  succinate (TOPROL -XL) 25 MG 24 hr tablet TAKE 1 TABLET (25 MG TOTAL) BY MOUTH DAILY. 90 tablet 0   montelukast  (SINGULAIR ) 10 MG tablet Take 1 tablet (10 mg total) by mouth at bedtime. 90 tablet 3   NURTEC 75 MG TBDP TAKE 1 TABLET BY MOUTH  EVERY OTHER DAY 40 tablet 2   QUEtiapine (SEROQUEL) 25 MG tablet Take 25-50 mg by mouth at bedtime as needed (sleep).     SODIUM FLUORIDE  5000 PPM 1.1 % PSTE 1 application  at bedtime.     spironolactone  (ALDACTONE ) 25 MG tablet TAKE 1 TABLET (25 MG TOTAL) BY MOUTH DAILY. 90 tablet 0   tiZANidine  (ZANAFLEX ) 2 MG tablet TAKE 1 TABLET (2MG  TOTAL) BY MOUTH TWICE A DAY AS NEEDED FOR MUSCLE SPASM 180 tablet 1   Vibegron (GEMTESA) 75 MG TABS Take 75 mg by mouth at bedtime.     VRAYLAR 4.5 MG CAPS Take 4.5 mg by mouth at bedtime.     zinc  gluconate 50 MG tablet Take 50 mg by mouth daily.     promethazine  (PHENERGAN ) 12.5 MG tablet Take 1 tablet (12.5 mg total) by mouth every 6 (six) hours as needed  for nausea or vomiting. 30 tablet 0   HYDROcodone -acetaminophen  (NORCO/VICODIN) 5-325 MG tablet Take 1 tablet by mouth every 6 (six) hours as needed for severe pain (pain score 7-10). 6 tablet 0   rosuvastatin  (CRESTOR ) 20 MG tablet TAKE 1 TABLET BY MOUTH EVERY DAY (Patient not taking: Reported on 01/11/2024) 90 tablet 0   No facility-administered medications prior to visit.    ROS Review of Systems  Constitutional:  Negative for appetite change, chills, diaphoresis, fatigue and fever.  HENT: Negative.    Eyes: Negative.   Respiratory: Negative.  Negative for cough, chest tightness, shortness of breath and wheezing.   Cardiovascular:  Negative for chest pain, palpitations and leg swelling.  Gastrointestinal: Negative.  Negative for abdominal pain, diarrhea, nausea and vomiting.  Genitourinary: Negative.  Negative for difficulty urinating and dysuria.  Musculoskeletal: Negative.  Negative for arthralgias and myalgias.  Skin: Negative.  Negative for color change and pallor.  Neurological:  Positive for tremors and headaches. Negative for dizziness, weakness and light-headedness.  Hematological:  Negative for adenopathy. Does not bruise/bleed easily.  Psychiatric/Behavioral: Negative.  Negative for agitation, behavioral problems, confusion, decreased concentration, dysphoric mood and sleep disturbance. The patient is not nervous/anxious.     Objective:  BP 118/82 (BP Location: Right Arm, Patient Position: Sitting)   Pulse 80   Temp 98.3 F (36.8 C) (Temporal)   Resp 16   Ht 4' 11 (1.499 m)   Wt 128 lb (58.1 kg)   LMP 03/05/2012 (Approximate)   SpO2 97%   BMI 25.85 kg/m   BP Readings from Last 3 Encounters:  01/11/24 118/82  01/02/24 119/78  12/22/23 110/78    Wt Readings from Last 3 Encounters:  01/11/24 128 lb (58.1 kg)  01/02/24 127 lb (57.6 kg)  12/22/23 132 lb (59.9 kg)    Physical Exam Vitals reviewed.  Constitutional:      Appearance: Normal appearance.  HENT:      Nose: Nose normal.     Mouth/Throat:     Mouth: Mucous membranes are moist.  Eyes:     General: No scleral icterus.    Conjunctiva/sclera: Conjunctivae normal.  Cardiovascular:     Rate and Rhythm: Normal rate and regular rhythm.     Heart sounds: No murmur heard.    No friction rub. No gallop.  Pulmonary:     Effort: Pulmonary effort is normal.     Breath sounds: No stridor. No wheezing, rhonchi or rales.  Abdominal:     General: Abdomen is flat.     Palpations: There is no mass.     Tenderness: There is  no abdominal tenderness. There is no guarding.     Hernia: No hernia is present.  Musculoskeletal:        General: Normal range of motion.     Cervical back: Neck supple.     Right lower leg: No edema.     Left lower leg: No edema.  Lymphadenopathy:     Cervical: No cervical adenopathy.  Skin:    General: Skin is warm and dry.  Neurological:     General: No focal deficit present.     Mental Status: She is alert.     Motor: Tremor present.  Psychiatric:        Attention and Perception: Attention normal.        Speech: Speech is tangential. Speech is not delayed.        Behavior: Behavior is slowed. Behavior is not withdrawn.        Cognition and Memory: Cognition normal.        Judgment: Judgment normal.     Lab Results  Component Value Date   WBC 9.6 12/13/2023   HGB 13.9 12/13/2023   HCT 43.6 12/13/2023   PLT 191 12/13/2023   GLUCOSE 111 (H) 12/13/2023   CHOL 143 12/22/2023   TRIG 107.0 12/22/2023   HDL 68.40 12/22/2023   LDLDIRECT 143.0 09/14/2016   LDLCALC 53 12/22/2023   ALT 22 12/22/2023   AST 25 12/22/2023   NA 141 12/13/2023   K 4.1 12/13/2023   CL 104 12/13/2023   CREATININE 0.93 12/13/2023   BUN 18 12/13/2023   CO2 24 12/13/2023   TSH 0.88 12/22/2023   INR 0.94 12/21/2017   HGBA1C 5.7 (H) 12/13/2023   MICROALBUR <0.7 06/21/2023   The 10-year ASCVD risk score (Arnett DK, et al., 2019) is: 11.9%   Values used to calculate the score:      Age: 64 years     Clincally relevant sex: Female     Is Non-Hispanic African American: No     Diabetic: Yes     Tobacco smoker: No     Systolic Blood Pressure: 118 mmHg     Is BP treated: Yes     HDL Cholesterol: 68.4 mg/dL     Total Cholesterol: 143 mg/dL   Assessment & Plan:  Extrapyramidal movement disorder, drug-induced -     Trihexyphenidyl  HCl; Take 1 tablet (2 mg total) by mouth 3 (three) times daily with meals.  Dispense: 270 tablet; Refill: 0  Migraine with aura and without status migrainosus, not intractable -     Promethazine  HCl; Take 1 tablet (12.5 mg total) by mouth every 6 (six) hours as needed for nausea or vomiting.  Dispense: 30 tablet; Refill: 1  Hyperlipidemia LDL goal <100- Will restart the statin for CV risk reduction. -     Rosuvastatin  Calcium ; Take 1 tablet (20 mg total) by mouth daily.  Dispense: 90 tablet; Refill: 0     Follow-up: Return in about 3 months (around 04/10/2024).  Debby Molt, MD

## 2024-01-11 NOTE — Patient Instructions (Signed)
 Tremor A tremor is trembling or shaking that you cannot control. Most tremors affect the hands or arms. Tremors can also affect the head, vocal cords, face, and other parts of the body. There are many types of tremors. Common types include: Essential tremor. These usually occur in people older than 40. This type of tremor may run in families and can happen in otherwise healthy people. Resting tremor. These occur when the muscles are at rest, such as when your hands are resting in your lap. People with Parkinson's disease often have resting tremors. Postural tremor. These occur when you try to hold a pose, such as keeping your hands outstretched. Kinetic tremor. These occur during purposeful movement, such as trying to touch a finger to your nose. Task-specific tremor. These may occur when you do certain tasks such as writing, speaking, or standing. Psychogenic tremor. These are greatly reduced or go away when you are distracted. These tremors happen due to underlying stress or psychiatric disease. They can happen in people of all ages. Some types of tremors have no known cause. Tremors can also be a symptom of nervous system problems (neurological disorders) that may occur with aging. Some tremors go away with treatment, while others do not. Follow these instructions at home: Lifestyle     If you drink alcohol: Limit how much you have to: 0-1 drink a day for women who are not pregnant. 0-2 drinks a day for men. Know how much alcohol is in a drink. In the [place] one drink equals one 12 oz bottle of beer (355 mL), one 5 oz glass of wine (148 mL), or one 1 oz glass of hard liquor (44 mL). Do not use any products that contain nicotine or tobacco. These products include cigarettes, chewing tobacco, and vaping devices, such as e-cigarettes. If you need help quitting, ask your health care provider. Avoid extreme heat and extreme cold. Limit your caffeine intake, as told by your health care  provider. Try to get 8 hours of sleep each night. Find ways to manage your stress, such as meditation or yoga. General instructions Take over-the-counter and prescription medicines only as told by your health care provider. Keep all follow-up visits. This is important. Contact a health care provider if: You develop a tremor after starting a new medicine. You have a tremor along with other symptoms such as: Numbness. Tingling. Pain. Weakness. Your tremor gets worse. Your tremor interferes with your day-to-day life. Summary A tremor is trembling or shaking that you cannot control. Most tremors affect the hands or arms. Some types of tremors have no known cause. Others may be a symptom of nervous system problems (neurological disorders). Make sure you discuss any tremors you have with your health care provider. This information is not intended to replace advice given to you by your health care provider. Make sure you discuss any questions you have with your health care provider. Document Revised: 11/14/2020 Document Reviewed: 11/14/2020 Elsevier Patient Education  2024 [employer].

## 2024-01-13 ENCOUNTER — Other Ambulatory Visit

## 2024-01-18 ENCOUNTER — Other Ambulatory Visit: Payer: Self-pay | Admitting: Internal Medicine

## 2024-01-18 DIAGNOSIS — G2409 Other drug induced dystonia: Secondary | ICD-10-CM

## 2024-01-20 ENCOUNTER — Ambulatory Visit: Admitting: Family Medicine

## 2024-01-20 ENCOUNTER — Encounter: Payer: Self-pay | Admitting: Family Medicine

## 2024-01-20 ENCOUNTER — Other Ambulatory Visit: Payer: Self-pay | Admitting: Orthopedic Surgery

## 2024-01-20 VITALS — BP 104/68 | HR 99 | Temp 98.2°F | Ht 59.0 in | Wt 125.0 lb

## 2024-01-20 DIAGNOSIS — B9689 Other specified bacterial agents as the cause of diseases classified elsewhere: Secondary | ICD-10-CM

## 2024-01-20 DIAGNOSIS — R251 Tremor, unspecified: Secondary | ICD-10-CM

## 2024-01-20 DIAGNOSIS — E538 Deficiency of other specified B group vitamins: Secondary | ICD-10-CM

## 2024-01-20 DIAGNOSIS — J329 Chronic sinusitis, unspecified: Secondary | ICD-10-CM | POA: Diagnosis not present

## 2024-01-20 MED ORDER — AMOXICILLIN-POT CLAVULANATE 875-125 MG PO TABS: 1.0000 | ORAL_TABLET | Freq: Two times a day (BID) | ORAL | 0 refills | Status: AC

## 2024-01-20 MED ORDER — CYANOCOBALAMIN 1000 MCG/ML IJ SOLN
1000.0000 ug | Freq: Once | INTRAMUSCULAR | Status: AC
  Administered 2024-01-20: 1000 ug via INTRAMUSCULAR

## 2024-01-20 NOTE — Progress Notes (Signed)
 "  Acute Office Visit  Subjective:     Patient ID: [name], female    DOB: [birth year], 68 y.o.   MRN: [id number]  Chief Complaint  Patient presents with   Acute Visit    Nasal congestion, cheek tenderness, right eye film, ongoing for 2wks Requesting to discuss trimmer in left wrist prev discussed with PCP    HPI  Discussed the use of AI scribe software for clinical note transcription with the patient, who gave verbal consent to proceed.  History of Present Illness [name] is a 68 year old female who presents with persistent cough and sinus congestion for two weeks.  Upper respiratory symptoms - Persistent cough and sinus congestion for two weeks - Nasal discharge progressed from yellow to yellow-green and is now green - Cheek tenderness and sinus headaches - Right eye develops mucous film with tearing and bloodshot appearance that worsens with sinus symptoms - No ear pain - Intermittent cough with difficulty expectorating sputum - Unsure about fever due to lack of thermometer  Movement disorder symptoms - Wrist tremor not controlled by Austedo  - Trihexyphenidyl  previously controlled symptoms but is not covered by insurance - Requests assistance with insurance appeal for Trihexyphenidyl   Vitamin b12 supplementation - Uncertain if monthly B12 injection was received in November - No appointment scheduled for December injection  Symptom management and allergies - Treating symptoms with allergy pills, increased fluids, and rest - History of environmental allergies - Allergy to sulfa antibiotics     ROS Per HPI      Objective:    BP 104/68 (BP Location: Left Arm, Patient Position: Sitting)   Pulse 99   Temp 98.2 F (36.8 C) (Temporal)   Ht 4' 11 (1.499 m)   Wt 125 lb (56.7 kg)   LMP 03/05/2012   SpO2 95%   BMI 25.25 kg/m    Physical Exam Vitals and nursing note reviewed.  Constitutional:      General: She is not in acute distress.     Appearance: She is normal weight.     Comments: Appears fatigued  HENT:     Head: Normocephalic and atraumatic.     Right Ear: External ear normal.     Left Ear: External ear normal.     Nose: Congestion present.     Right Sinus: Maxillary sinus tenderness present.     Left Sinus: Maxillary sinus tenderness present.     Mouth/Throat:     Mouth: Mucous membranes are moist.     Comments: Oropharyngeal cobblestoning   Eyes:     Extraocular Movements: Extraocular movements intact.     Pupils: Pupils are equal, round, and reactive to light.  Cardiovascular:     Rate and Rhythm: Normal rate and regular rhythm.     Pulses: Normal pulses.     Heart sounds: Normal heart sounds.  Pulmonary:     Effort: Pulmonary effort is normal. No respiratory distress.     Breath sounds: Normal breath sounds. No wheezing, rhonchi or rales.  Musculoskeletal:     Right lower leg: No edema.     Left lower leg: No edema.  Lymphadenopathy:     Cervical: No cervical adenopathy.  Neurological:     General: No focal deficit present.     Mental Status: She is alert and oriented to person, place, and time.     Comments: Hand tremors  Psychiatric:        Mood and Affect: Mood normal.  Thought Content: Thought content normal.     No results found for any visits on 01/20/24.      Assessment & Plan:   Assessment and Plan Assessment & Plan Acute bacterial sinusitis Symptoms and clinical presentation consistent with acute bacterial sinusitis. Right eye irritation likely due to sinus drainage. - Prescribed Augmentin  twice daily for one week. - Advised to take Augmentin  with food to minimize gastrointestinal upset.  Tremor of both hands Wrist tremor previously managed with trihexyphenidyl . Austedo  ineffective and not covered by insurance. Insurance issues need resolution to restart trihexyphenidyl . - chronic  Vitamin B12 deficiency Uncertain if B12 injection received in November. Importance of  monthly B12 injections discussed. - Checked records for B12 injection in November. - chronic, stable - Administered B12 injection today if not received in November.     No orders of the defined types were placed in this encounter.    Meds ordered this encounter  Medications   amoxicillin -clavulanate (AUGMENTIN ) 875-125 MG tablet    Sig: Take 1 tablet by mouth 2 (two) times daily for 7 days.    Dispense:  14 tablet    Refill:  0   cyanocobalamin  (VITAMIN B12) injection 1,000 mcg    Return if symptoms worsen or fail to improve, for as scheduled with Dr Joshua.  Corean LITTIE Ku, FNP  "

## 2024-01-20 NOTE — Patient Instructions (Signed)
 I have sent in Augmentin  for you to take twice a day for 10 days.  This medication can upset your stomach, so I tell everyone to take it with a meal.  We have given your B12 injection in the office today as well.   Will send Dr Joshua a message about your medications and insurance denial.   Follow-up with me for new or worsening symptoms.

## 2024-01-23 ENCOUNTER — Telehealth: Payer: Self-pay

## 2024-01-23 ENCOUNTER — Other Ambulatory Visit (HOSPITAL_COMMUNITY): Payer: Self-pay

## 2024-01-23 DIAGNOSIS — F3132 Bipolar disorder, current episode depressed, moderate: Secondary | ICD-10-CM | POA: Diagnosis not present

## 2024-01-23 DIAGNOSIS — Z5181 Encounter for therapeutic drug level monitoring: Secondary | ICD-10-CM | POA: Diagnosis not present

## 2024-01-23 DIAGNOSIS — F411 Generalized anxiety disorder: Secondary | ICD-10-CM | POA: Diagnosis not present

## 2024-01-23 NOTE — Telephone Encounter (Signed)
 Pharmacy Patient Advocate Encounter   Received notification from Onbase that prior authorization for Trihexyphenidyl  HCl 2MG  tablets  is required/requested.   Insurance verification completed.   The patient is insured through [facility] [employer].   Per test claim: PA required; PA submitted to above mentioned insurance via Latent Key/confirmation #/EOC [id number] Status is pending

## 2024-01-24 NOTE — Telephone Encounter (Signed)
 no

## 2024-01-24 NOTE — Telephone Encounter (Signed)
Can you answer these questions?

## 2024-01-24 NOTE — Telephone Encounter (Signed)
 Prior Authorization form/request asks a question that requires your assistance. Please see the question below and advise accordingly. The PA will not be submitted until the necessary information is received.  I do not see if pt has tried Amatadine need to know if pt has taken or has a CONTRAINDICATION SEE QUESTION BELOW.

## 2024-01-25 ENCOUNTER — Other Ambulatory Visit (HOSPITAL_COMMUNITY): Payer: Self-pay

## 2024-01-26 ENCOUNTER — Other Ambulatory Visit (HOSPITAL_COMMUNITY): Payer: Self-pay

## 2024-01-26 NOTE — Telephone Encounter (Signed)
 TJ the insurance prefers that we use Amantadine instead. Do you feel that that medication would be appropriate ?

## 2024-01-26 NOTE — Telephone Encounter (Signed)
 Pharmacy Patient Advocate Encounter  Received notification from [facility] that Prior Authorization for Trihexyphenidyl  HCl 2MG  tablets  has been DENIED.  See denial reason below. No denial letter attached in CMM. Will attach denial letter to Media tab once received.   Per test claim: AMANTADINE is  preferred by the insurance.  If suggested medication is appropriate, Please send in a new RX and discontinue this one. If not, please advise as to why it's not appropriate so that we may request a Prior Authorization. Please note, some preferred medications may still require a PA.  If the suggested medications have not been trialed and there are no contraindications to their use, the PA will not be submitted, as it will not be approved.    PA #/Case ID/Reference #: [id number]

## 2024-01-27 NOTE — Telephone Encounter (Signed)
 Patient wants to know what else can be done for her since her medication isn't being approved?

## 2024-01-27 NOTE — Telephone Encounter (Signed)
 no

## 2024-01-28 ENCOUNTER — Emergency Department (HOSPITAL_COMMUNITY)
Admission: EM | Admit: 2024-01-28 | Discharge: 2024-01-29 | Disposition: A | Discharge status: Home / Self Care | Attending: Emergency Medicine | Admitting: Emergency Medicine

## 2024-01-28 ENCOUNTER — Other Ambulatory Visit: Payer: Self-pay

## 2024-01-28 ENCOUNTER — Encounter (HOSPITAL_COMMUNITY): Payer: Self-pay | Admitting: *Deleted

## 2024-01-28 DIAGNOSIS — J45909 Unspecified asthma, uncomplicated: Secondary | ICD-10-CM | POA: Diagnosis not present

## 2024-01-28 DIAGNOSIS — Z7951 Long term (current) use of inhaled steroids: Secondary | ICD-10-CM | POA: Insufficient documentation

## 2024-01-28 DIAGNOSIS — R258 Other abnormal involuntary movements: Secondary | ICD-10-CM

## 2024-01-28 DIAGNOSIS — G2119 Other drug induced secondary parkinsonism: Secondary | ICD-10-CM | POA: Diagnosis not present

## 2024-01-28 DIAGNOSIS — D72829 Elevated white blood cell count, unspecified: Secondary | ICD-10-CM | POA: Diagnosis not present

## 2024-01-28 DIAGNOSIS — R7989 Other specified abnormal findings of blood chemistry: Secondary | ICD-10-CM | POA: Diagnosis not present

## 2024-01-28 DIAGNOSIS — D751 Secondary polycythemia: Secondary | ICD-10-CM | POA: Diagnosis not present

## 2024-01-28 DIAGNOSIS — T50905A Adverse effect of unspecified drugs, medicaments and biological substances, initial encounter: Secondary | ICD-10-CM

## 2024-01-28 DIAGNOSIS — E1165 Type 2 diabetes mellitus with hyperglycemia: Secondary | ICD-10-CM | POA: Insufficient documentation

## 2024-01-28 DIAGNOSIS — R739 Hyperglycemia, unspecified: Secondary | ICD-10-CM

## 2024-01-28 DIAGNOSIS — R4182 Altered mental status, unspecified: Secondary | ICD-10-CM | POA: Diagnosis not present

## 2024-01-28 LAB — COMPREHENSIVE METABOLIC PANEL WITH GFR
ALT: 17 U/L (ref 0–44)
AST: 29 U/L (ref 15–41)
Albumin: 4.4 g/dL (ref 3.5–5.0)
Alkaline Phosphatase: 87 U/L (ref 38–126)
Anion gap: 17 — ABNORMAL HIGH (ref 5–15)
BUN: 22 mg/dL (ref 8–23)
CO2: 24 mmol/L (ref 22–32)
Calcium: 10.7 mg/dL — ABNORMAL HIGH (ref 8.9–10.3)
Chloride: 98 mmol/L (ref 98–111)
Creatinine, Ser: 1.04 mg/dL — ABNORMAL HIGH (ref 0.44–1.00)
GFR, Estimated: 59 mL/min — ABNORMAL LOW
Glucose, Bld: 136 mg/dL — ABNORMAL HIGH (ref 70–99)
Potassium: 3.9 mmol/L (ref 3.5–5.1)
Sodium: 138 mmol/L (ref 135–145)
Total Bilirubin: 0.6 mg/dL (ref 0.0–1.2)
Total Protein: 7.5 g/dL (ref 6.5–8.1)

## 2024-01-28 LAB — CBC
HCT: 47.4 % — ABNORMAL HIGH (ref 36.0–46.0)
Hemoglobin: 15.9 g/dL — ABNORMAL HIGH (ref 12.0–15.0)
MCH: 30.5 pg (ref 26.0–34.0)
MCHC: 33.5 g/dL (ref 30.0–36.0)
MCV: 91 fL (ref 80.0–100.0)
Platelets: 262 K/uL (ref 150–400)
RBC: 5.21 MIL/uL — ABNORMAL HIGH (ref 3.87–5.11)
RDW: 12.9 % (ref 11.5–15.5)
WBC: 12.3 K/uL — ABNORMAL HIGH (ref 4.0–10.5)
nRBC: 0 % (ref 0.0–0.2)

## 2024-01-28 LAB — URINALYSIS, ROUTINE W REFLEX MICROSCOPIC
Glucose, UA: >=500 mg/dL — AB
Hgb urine dipstick: NEGATIVE
Ketones, ur: 40 mg/dL — AB
Leukocytes,Ua: NEGATIVE
Nitrite: NEGATIVE
Protein, ur: NEGATIVE mg/dL
Specific Gravity, Urine: 1.02 (ref 1.005–1.030)
pH: 6 (ref 5.0–8.0)

## 2024-01-28 LAB — TROPONIN T, HIGH SENSITIVITY: Troponin T High Sensitivity: 15 ng/L (ref 0–19)

## 2024-01-28 LAB — CBG MONITORING, ED: Glucose-Capillary: 147 mg/dL — ABNORMAL HIGH (ref 70–99)

## 2024-01-28 LAB — URINALYSIS, MICROSCOPIC (REFLEX)

## 2024-01-28 NOTE — ED Provider Triage Note (Signed)
 Emergency Medicine Provider Triage Evaluation Note  [name] , a 68 y.o. female  was evaluated in triage.  Pt complains of weakness. Hx of Parkinson.  3 months ago her doctor no longer able to prescribed her parkinson medication due to insurance and since she has steady decline in health, felt weaker, more tired and having more trouble thinking and function.  She was placed on a new meds for the past several weeks but notice no difference.  No fever, chills, cough, or pain.  Increase L arm tremors  Review of Systems  Positive: As above Negative: As above  Physical Exam  BP (!) 146/96 (BP Location: Right Arm)   Pulse (!) 124   Temp (!) 97.4 F (36.3 C)   Resp 18   Ht 4' 11 (1.499 m)   Wt 56.7 kg   LMP 03/05/2012   SpO2 96%   BMI 25.25 kg/m  Gen:   Awake, no distress   Resp:  Normal effort  MSK:   Moves extremities without difficulty  Other:    Medical Decision Making  Medically screening exam initiated at 8:29 PM.  Appropriate orders placed.  [name] was informed that the remainder of the evaluation will be completed by another provider, this initial triage assessment does not replace that evaluation, and the importance of remaining in the ED until their evaluation is complete.     Nivia Colon, PA-C 01/28/24 2031

## 2024-01-28 NOTE — Telephone Encounter (Signed)
 I think she should see neuro asap

## 2024-01-28 NOTE — ED Triage Notes (Signed)
 Pt complaining of feeling confused and not clear headed. Has been going on for a month. Has had some medication changes over the last few months. She thinks that is the reason.

## 2024-01-29 MED ORDER — TRIHEXYPHENIDYL HCL 2 MG PO TABS: 2.0000 mg | ORAL_TABLET | Freq: Three times a day (TID) | ORAL | 0 refills | Status: AC

## 2024-01-29 NOTE — ED Notes (Signed)
 Pt started sleep apnea machine to sleep in lobby

## 2024-01-29 NOTE — Discharge Instructions (Addendum)
 Please resume taking trihexyphenidyl  3 times a day, stop taking Austedo .  Please keep your appointment with the neurologist next month.  Return if you are having any problems.

## 2024-01-29 NOTE — ED Provider Notes (Signed)
 " [facility] Provider Note   CSN: [id number] Arrival date & time: 01/28/24  8070     Patient presents with: Altered Mental Status   [name] is a 68 y.o. female.   The history is provided by the patient.  Altered Mental Status  She has history of bipolar disorder, diabetes, asthma, and GERD, drug-induced parkinsonism and comes in because of difficulty getting activities started.  She had been on trihexyphenidyl  and was doing well until her insurance said it would not pay for.  She was switched to Austedo  without any benefit.  She states she knows what she wants to do, but cannot get the activity initiated.  She did not improve with increasing the dose of Austedo .  Of note, she is scheduled to see her neurologist in about 3 weeks.    Prior to Admission medications  Medication Sig Start Date End Date Taking? Authorizing Provider  albuterol  (VENTOLIN  HFA) 108 (90 Base) MCG/ACT inhaler INHALE 1-2 PUFFS INTO THE LUNGS DAILY AS NEEDED FOR WHEEZING OR SHORTNESS OF BREATH. 01/09/24   Iva Marty Saltness, MD  ascorbic acid (VITAMIN C) 500 MG tablet Take 500 mg by mouth daily.    [provider]  Azelastine  HCl 137 MCG/SPRAY SOLN Place 2 sprays into the nose 2 (two) times daily. Patient taking differently: Place 2 sprays into the nose daily. 05/19/23   Iva Marty Saltness, MD  budesonide -formoterol  (SYMBICORT ) 160-4.5 MCG/ACT inhaler Inhale 2 puffs into the lungs daily.    [provider]  Carbinoxamine  Maleate 4 MG TABS Take 1 tablet (4 mg total) by mouth every 6 (six) hours. 05/19/23   Iva Marty Saltness, MD  Cholecalciferol  (VITAMIN D ) 50 MCG (2000 UT) CAPS Take 2,000 Units by mouth daily.    [provider]  clonazePAM  (KLONOPIN ) 1 MG tablet Take 1 mg by mouth daily as needed for anxiety. 11/10/22   [provider]  cyanocobalamin  (VITAMIN B12) 1000 MCG/ML injection Inject 1,000 mcg into the muscle  every 30 (thirty) days.    [provider]  dicyclomine  (BENTYL ) 10 MG capsule TAKE 1 CAPSULE BY MOUTH UP TO 3 TIMES DAILY BEFORE MEALS. 12/19/23   Joshua Debby CROME, MD  DULoxetine (CYMBALTA) 60 MG capsule Take 60 mg by mouth 2 (two) times daily at 10 AM and 5 PM. 04/29/21   [provider]  fluticasone  (FLONASE ) 50 MCG/ACT nasal spray PLACE 1 SPRAY INTO BOTH NOSTRILS 2 (TWO) TIMES DAILY AS NEEDED FOR ALLERGIES OR RHINITIS. 08/17/23   Iva Marty Saltness, MD  JARDIANCE  10 MG TABS tablet TAKE 1 TABLET BY MOUTH EVERY DAY BEFORE BREAKFAST 11/16/23   Joshua Debby CROME, MD  l-methylfolate-B6-B12 (METANX) 3-35-2 MG TABS tablet Take 2 tablets by mouth at bedtime.    [provider]  levothyroxine  (SYNTHROID ) 25 MCG tablet TAKE 1 TABLET BY MOUTH EVERY DAY 11/15/23   Joshua Debby CROME, MD  metoprolol  succinate (TOPROL -XL) 25 MG 24 hr tablet TAKE 1 TABLET (25 MG TOTAL) BY MOUTH DAILY. 12/19/23   Joshua Debby CROME, MD  montelukast  (SINGULAIR ) 10 MG tablet Take 1 tablet (10 mg total) by mouth at bedtime. 11/17/23   Iva Marty Saltness, MD  NURTEC 75 MG TBDP TAKE 1 TABLET BY MOUTH EVERY OTHER DAY 01/11/24   Joshua Debby CROME, MD  promethazine  (PHENERGAN ) 12.5 MG tablet Take 1 tablet (12.5 mg total) by mouth every 6 (six) hours as needed for nausea or vomiting. 01/11/24   Joshua Debby CROME,  MD  QUEtiapine (SEROQUEL) 25 MG tablet Take 25-50 mg by mouth at bedtime as needed (sleep).    [provider]  rosuvastatin  (CRESTOR ) 20 MG tablet Take 1 tablet (20 mg total) by mouth daily. 01/11/24   Joshua Debby CROME, MD  SODIUM FLUORIDE  5000 PPM 1.1 % PSTE 1 application  at bedtime. 05/22/23   [provider]  spironolactone  (ALDACTONE ) 25 MG tablet TAKE 1 TABLET (25 MG TOTAL) BY MOUTH DAILY. 10/18/23   Joshua Debby CROME, MD  tiZANidine  (ZANAFLEX ) 2 MG tablet TAKE 1 TABLET (2MG  TOTAL) BY MOUTH TWICE A DAY AS NEEDED FOR MUSCLE SPASM 01/21/24   Addie Cordella Hamilton, MD  trihexyphenidyl  (ARTANE ) 2 MG  tablet Take 1 tablet (2 mg total) by mouth 3 (three) times daily with meals. 01/29/24   Raford Lenis, MD  Vibegron (GEMTESA) 75 MG TABS Take 75 mg by mouth at bedtime.    [provider]  VRAYLAR 4.5 MG CAPS Take 4.5 mg by mouth at bedtime. 11/17/23   [provider]  zinc  gluconate 50 MG tablet Take 50 mg by mouth daily.    [provider]    Allergies: Emetrol, Indomethacin, Iodinated contrast media, Amlodipine , Carbamazepine , Pseudoephedrine, Risperidone and paliperidone, Bupivacaine, Pentazocine, Propoxyphene, Sulfa antibiotics, Tramadol , Aspirin, Codeine, Etodolac, and Propranolol    Review of Systems  All other systems reviewed and are negative.   Updated Vital Signs BP 116/76 (BP Location: Left Arm)   Pulse 82   Temp 98.1 F (36.7 C) (Oral)   Resp 16   Ht 4' 11 (1.499 m)   Wt 56.7 kg   LMP 03/05/2012   SpO2 100%   BMI 25.25 kg/m   Physical Exam Vitals and nursing note reviewed.   68 year old female, resting comfortably and in no acute distress. Vital signs are normal. Oxygen  saturation is 100%, which is normal. Head is normocephalic and atraumatic. PERRLA, EOMI.  Lungs are clear without rales, wheezes, or rhonchi. Heart has regular rate and rhythm without murmur. Abdomen is soft, flat, nontender. Extremities have no cyanosis or edema. Skin is warm and dry without rash. Neurologic: Mental status is normal, cranial nerves are intact, moves all extremities equally.  Resting tremor is noted.  Cogwheel rigidity is present..  (all labs ordered are listed, but only abnormal results are displayed) Labs Reviewed  COMPREHENSIVE METABOLIC PANEL WITH GFR - Abnormal; Notable for the following components:      Result Value   Glucose, Bld 136 (*)    Creatinine, Ser 1.04 (*)    Calcium  10.7 (*)    GFR, Estimated 59 (*)    Anion gap 17 (*)    All other components within normal limits  CBC - Abnormal; Notable for the following components:   WBC 12.3 (*)     RBC 5.21 (*)    Hemoglobin 15.9 (*)    HCT 47.4 (*)    All other components within normal limits  URINALYSIS, ROUTINE W REFLEX MICROSCOPIC - Abnormal; Notable for the following components:   Glucose, UA >=500 (*)    Bilirubin Urine MODERATE (*)    Ketones, ur 40 (*)    All other components within normal limits  URINALYSIS, MICROSCOPIC (REFLEX) - Abnormal; Notable for the following components:   Bacteria, UA RARE (*)    All other components within normal limits  CBG MONITORING, ED - Abnormal; Notable for the following components:   Glucose-Capillary 147 (*)    All other components within normal limits  TROPONIN T,  HIGH SENSITIVITY    EKG: EKG Interpretation Date/Time:  Saturday January 28 2024 19:46:31 EST Ventricular Rate:  119 PR Interval:  142 QRS Duration:  56 QT Interval:  296 QTC Calculation: 416 R Axis:   17  Text Interpretation: Sinus tachycardia Cannot rule out Anterior infarct , age undetermined Abnormal ECG When compared with ECG of 16-Jan-2018 03:46, No significant change was found Confirmed by Raford Lenis ([id number]) on 01/28/2024 11:14:18 PM  Radiology: No results found.   Procedures   Medications Ordered in the ED - No data to display                                  Medical Decision Making Risk Prescription drug management.   Bradykinesia which has developed since stopping trihexyphenidyl  and not responding to Austedo .  I discussed with the patient and she is willing to pay out-of-pocket for trihexyphenidyl .  I am giving her a new prescription for trihexyphenidyl  and I have also provided her with a GoodRx coupon for it.  Response can be assessed by her neurologist at follow-up next month.  I have reviewed her laboratory tests, and my interpretation is mildly elevated random glucose, borderline elevated creatinine not felt to be clinically significant, mild leukocytosis which is nonspecific, mild polycythemia of uncertain clinical significance, no  UTI.     Final diagnoses:  Bradykinesia  Elevated random blood glucose level  Polycythemia  Drug-induced parkinsonism    ED Discharge Orders          Ordered    trihexyphenidyl  (ARTANE ) 2 MG tablet  3 times daily with meals        01/29/24 0445               Raford Lenis, MD 01/29/24 0502  "

## 2024-01-30 NOTE — Telephone Encounter (Signed)
 Unable to reach patient. LMTRC

## 2024-01-31 ENCOUNTER — Telehealth: Payer: Self-pay

## 2024-01-31 NOTE — Telephone Encounter (Signed)
 Copied from CRM [id number]. Topic: General - Other >> Jan 31, 2024 10:33 AM Rosina BIRCH wrote: Reason for [id number] returning a call Jasmine  [phone]   ----------------------------------------------------------------------- From previous Reason for Contact - Registration Update: Patient/patient representative is calling to make an update to registration.

## 2024-01-31 NOTE — Telephone Encounter (Signed)
 Patient has been made aware gave a verbal understanding.

## 2024-02-06 NOTE — Telephone Encounter (Signed)
 Patient states that she can't remember what she called about but she got it taken care of and didn't need our help anymore.

## 2024-02-08 ENCOUNTER — Telehealth: Payer: Self-pay | Admitting: Internal Medicine

## 2024-02-08 NOTE — Telephone Encounter (Signed)
 Copied from CRM #[id number]. Topic: Clinical - Medical Advice >> Feb 08, 2024  9:30 AM Essie A wrote: Reason for CRM: Patient called because she can't get her Inspire device to turn off.  Needs someone to return her call as soon as possible.  She says it's an emergency.  Her phone number is [phone].  Thanks.

## 2024-02-10 ENCOUNTER — Telehealth: Payer: Self-pay

## 2024-02-10 ENCOUNTER — Other Ambulatory Visit (HOSPITAL_COMMUNITY): Payer: Self-pay

## 2024-02-10 NOTE — Telephone Encounter (Signed)
 Pharmacy Patient Advocate Encounter   Received notification from CoverMyMeds that prior authorization for Austedo  XR 6mg  tabs is due for renewal.   Insurance verification completed.   The patient is insured through [employer].  Action: Medication has been discontinued. Archived Key: [id number]

## 2024-02-10 NOTE — Telephone Encounter (Signed)
 Unable to reach patient. LMTRC

## 2024-02-11 ENCOUNTER — Other Ambulatory Visit: Payer: Self-pay | Admitting: Allergy & Immunology

## 2024-02-12 ENCOUNTER — Other Ambulatory Visit: Payer: Self-pay | Admitting: Internal Medicine

## 2024-02-12 DIAGNOSIS — E039 Hypothyroidism, unspecified: Secondary | ICD-10-CM

## 2024-02-15 NOTE — Telephone Encounter (Signed)
 Patient states that she got this taken care of.

## 2024-02-27 ENCOUNTER — Telehealth: Payer: Self-pay | Admitting: Adult Health

## 2024-02-27 NOTE — Telephone Encounter (Signed)
 Left voicemail for patient to call back to schedule 6 month INSPIRE follow up in March 2026 with Tammy Parrett or Dr. Neda.

## 2024-03-02 ENCOUNTER — Ambulatory Visit

## 2024-03-02 ENCOUNTER — Other Ambulatory Visit: Payer: Self-pay

## 2024-03-02 DIAGNOSIS — M810 Age-related osteoporosis without current pathological fracture: Secondary | ICD-10-CM | POA: Diagnosis not present

## 2024-03-02 MED ORDER — DENOSUMAB 60 MG/ML ~~LOC~~ SOSY
60.0000 mg | PREFILLED_SYRINGE | Freq: Once | SUBCUTANEOUS | Status: AC
  Administered 2024-03-02: 60 mg via SUBCUTANEOUS

## 2024-03-02 MED ORDER — DENOSUMAB 60 MG/ML ~~LOC~~ SOSY: 60.0000 mg | PREFILLED_SYRINGE | Freq: Once | SUBCUTANEOUS | Status: AC

## 2024-03-02 NOTE — Progress Notes (Signed)
 Patient visits today for their prolia  injection. Patient informed of what they had received and tolerated the injection well. Patient notified to reach out to the office if needed

## 2024-03-06 ENCOUNTER — Other Ambulatory Visit (HOSPITAL_COMMUNITY): Payer: Self-pay

## 2024-03-06 ENCOUNTER — Telehealth: Payer: Self-pay

## 2024-03-06 NOTE — Telephone Encounter (Signed)
 Pharmacy Patient Advocate Encounter   Received notification from Onbase CMM KEY that prior authorization for dicyclomine  (BENTYL ) 10 MG capsule  is required/requested.   Insurance verification completed.   The patient is insured through [facility].   Per test claim: PA required; PA submitted to above mentioned insurance via Latent Key/confirmation #/EOC [id number] Status is pending

## 2024-03-09 ENCOUNTER — Other Ambulatory Visit (HOSPITAL_COMMUNITY): Payer: Self-pay

## 2024-03-09 NOTE — Telephone Encounter (Signed)
 Pharmacy Patient Advocate Encounter  Received notification from [facility] that Prior Authorization for Dicyclomine  HCl 10MG  capsules   has been APPROVED from 03/08/2024 to 02/07/2025. Unable to obtain price due to refill too soon rejection, last fill date 03/08/2024 next available fill date04/08/2024   PA #/Case ID/Reference #: [id number]

## 2024-03-13 ENCOUNTER — Other Ambulatory Visit: Payer: Self-pay | Admitting: Allergy & Immunology

## 2024-03-14 ENCOUNTER — Ambulatory Visit: Admitting: Neurology

## 2024-03-16 ENCOUNTER — Other Ambulatory Visit: Payer: Self-pay | Admitting: Internal Medicine

## 2024-03-16 DIAGNOSIS — R000 Tachycardia, unspecified: Secondary | ICD-10-CM

## 2024-03-16 DIAGNOSIS — I493 Ventricular premature depolarization: Secondary | ICD-10-CM

## 2024-04-11 ENCOUNTER — Ambulatory Visit: Admitting: Internal Medicine

## 2024-05-16 ENCOUNTER — Ambulatory Visit: Admitting: Neurology

## 2024-05-17 ENCOUNTER — Ambulatory Visit: Admitting: Allergy & Immunology
